# Patient Record
Sex: Female | Born: 1940 | Race: White | Hispanic: No | Marital: Married
Health system: Southern US, Academic
[De-identification: ages and names within clinical notes are randomized; demographics above are authoritative.]

## PROBLEM LIST (undated history)

## (undated) ENCOUNTER — Ambulatory Visit: Payer: MEDICARE

## (undated) ENCOUNTER — Encounter: Payer: MEDICARE | Attending: Dermatology | Primary: Dermatology

## (undated) ENCOUNTER — Encounter

## (undated) ENCOUNTER — Encounter
Attending: Student in an Organized Health Care Education/Training Program | Primary: Student in an Organized Health Care Education/Training Program

## (undated) ENCOUNTER — Ambulatory Visit

## (undated) ENCOUNTER — Telehealth

## (undated) ENCOUNTER — Encounter: Attending: MOHS-Micrographic Surgery | Primary: MOHS-Micrographic Surgery

## (undated) DIAGNOSIS — L509 Urticaria, unspecified: Secondary | ICD-10-CM

## (undated) DIAGNOSIS — C50919 Malignant neoplasm of unspecified site of unspecified female breast: Secondary | ICD-10-CM

## (undated) DIAGNOSIS — I4892 Unspecified atrial flutter: Secondary | ICD-10-CM

## (undated) DIAGNOSIS — Z8051 Family history of malignant neoplasm of kidney: Secondary | ICD-10-CM

## (undated) DIAGNOSIS — J4489 Other specified chronic obstructive pulmonary disease: Secondary | ICD-10-CM

## (undated) DIAGNOSIS — J449 Chronic obstructive pulmonary disease, unspecified: Secondary | ICD-10-CM

## (undated) DIAGNOSIS — M949 Disorder of cartilage, unspecified: Secondary | ICD-10-CM

## (undated) DIAGNOSIS — Z800 Family history of malignant neoplasm of digestive organs: Secondary | ICD-10-CM

## (undated) DIAGNOSIS — M899 Disorder of bone, unspecified: Secondary | ICD-10-CM

## (undated) DIAGNOSIS — J45909 Unspecified asthma, uncomplicated: Secondary | ICD-10-CM

## (undated) DIAGNOSIS — G459 Transient cerebral ischemic attack, unspecified: Secondary | ICD-10-CM

## (undated) DIAGNOSIS — I639 Cerebral infarction, unspecified: Secondary | ICD-10-CM

## (undated) DIAGNOSIS — I5189 Other ill-defined heart diseases: Secondary | ICD-10-CM

## (undated) DIAGNOSIS — Z8041 Family history of malignant neoplasm of ovary: Secondary | ICD-10-CM

## (undated) DIAGNOSIS — I499 Cardiac arrhythmia, unspecified: Secondary | ICD-10-CM

## (undated) HISTORY — DX: Unspecified atrial flutter: I48.92

## (undated) HISTORY — DX: Family history of malignant neoplasm of kidney: Z80.51

## (undated) HISTORY — DX: Chronic obstructive pulmonary disease, unspecified: J44.9

## (undated) HISTORY — DX: Other ill-defined heart diseases: I51.89

## (undated) HISTORY — PX: BUNIONECTOMY: SHX129

## (undated) HISTORY — DX: Disorder of bone, unspecified: M94.9

## (undated) HISTORY — DX: Unspecified asthma, uncomplicated: J45.909

## (undated) HISTORY — DX: Disorder of bone, unspecified: M89.9

## (undated) HISTORY — DX: Family history of malignant neoplasm of digestive organs: Z80.0

## (undated) HISTORY — PX: EYE SURGERY: SHX253

## (undated) HISTORY — DX: Other specified chronic obstructive pulmonary disease: J44.89

## (undated) HISTORY — DX: Transient cerebral ischemic attack, unspecified: G45.9

## (undated) HISTORY — DX: Family history of malignant neoplasm of ovary: Z80.41

## (undated) HISTORY — DX: Urticaria, unspecified: L50.9

## (undated) HISTORY — PX: ABDOMINAL HYSTERECTOMY: SHX81

---

## 1999-08-12 ENCOUNTER — Other Ambulatory Visit: Admission: RE | Admit: 1999-08-12 | Discharge: 1999-08-12 | Payer: Self-pay | Admitting: Obstetrics and Gynecology

## 2000-01-12 ENCOUNTER — Encounter: Admission: RE | Admit: 2000-01-12 | Discharge: 2000-01-12 | Payer: Self-pay | Admitting: Obstetrics and Gynecology

## 2000-01-12 ENCOUNTER — Encounter: Payer: Self-pay | Admitting: Obstetrics and Gynecology

## 2000-02-16 ENCOUNTER — Encounter: Payer: Self-pay | Admitting: Allergy and Immunology

## 2000-02-16 ENCOUNTER — Encounter: Admission: RE | Admit: 2000-02-16 | Discharge: 2000-02-16 | Payer: Self-pay | Admitting: Allergy and Immunology

## 2000-02-26 ENCOUNTER — Ambulatory Visit (HOSPITAL_COMMUNITY): Admission: RE | Admit: 2000-02-26 | Discharge: 2000-02-26 | Payer: Self-pay | Admitting: Allergy and Immunology

## 2000-09-19 ENCOUNTER — Ambulatory Visit (HOSPITAL_COMMUNITY): Admission: RE | Admit: 2000-09-19 | Discharge: 2000-09-19 | Payer: Self-pay | Admitting: Allergy and Immunology

## 2000-10-03 ENCOUNTER — Encounter: Admission: RE | Admit: 2000-10-03 | Discharge: 2000-10-03 | Payer: Self-pay | Admitting: Orthopedic Surgery

## 2000-10-03 ENCOUNTER — Encounter: Payer: Self-pay | Admitting: Orthopedic Surgery

## 2000-10-14 ENCOUNTER — Other Ambulatory Visit: Admission: RE | Admit: 2000-10-14 | Discharge: 2000-10-14 | Payer: Self-pay | Admitting: Obstetrics and Gynecology

## 2000-10-18 ENCOUNTER — Encounter (HOSPITAL_COMMUNITY): Admission: RE | Admit: 2000-10-18 | Discharge: 2000-12-21 | Payer: Self-pay | Admitting: Family Medicine

## 2000-12-22 ENCOUNTER — Encounter (HOSPITAL_COMMUNITY): Admission: RE | Admit: 2000-12-22 | Discharge: 2001-03-22 | Payer: Self-pay | Admitting: Family Medicine

## 2001-01-13 ENCOUNTER — Encounter: Payer: Self-pay | Admitting: Family Medicine

## 2001-01-13 ENCOUNTER — Encounter: Admission: RE | Admit: 2001-01-13 | Discharge: 2001-01-13 | Payer: Self-pay | Admitting: Family Medicine

## 2001-03-23 ENCOUNTER — Encounter (HOSPITAL_COMMUNITY): Admission: RE | Admit: 2001-03-23 | Discharge: 2001-06-21 | Payer: Self-pay | Admitting: Family Medicine

## 2001-06-09 ENCOUNTER — Encounter: Admission: RE | Admit: 2001-06-09 | Discharge: 2001-06-09 | Payer: Self-pay | Admitting: Family Medicine

## 2001-06-09 ENCOUNTER — Encounter: Payer: Self-pay | Admitting: Family Medicine

## 2001-06-24 ENCOUNTER — Encounter (HOSPITAL_COMMUNITY): Admission: RE | Admit: 2001-06-24 | Discharge: 2001-09-22 | Payer: Self-pay | Admitting: Family Medicine

## 2001-10-19 ENCOUNTER — Other Ambulatory Visit: Admission: RE | Admit: 2001-10-19 | Discharge: 2001-10-19 | Payer: Self-pay | Admitting: Obstetrics and Gynecology

## 2002-01-17 ENCOUNTER — Encounter: Admission: RE | Admit: 2002-01-17 | Discharge: 2002-01-17 | Payer: Self-pay | Admitting: Family Medicine

## 2002-01-17 ENCOUNTER — Encounter: Payer: Self-pay | Admitting: Family Medicine

## 2003-01-23 ENCOUNTER — Encounter: Payer: Self-pay | Admitting: Family Medicine

## 2003-01-23 ENCOUNTER — Encounter: Admission: RE | Admit: 2003-01-23 | Discharge: 2003-01-23 | Payer: Self-pay | Admitting: Family Medicine

## 2004-02-20 ENCOUNTER — Encounter: Admission: RE | Admit: 2004-02-20 | Discharge: 2004-02-20 | Payer: Self-pay | Admitting: Family Medicine

## 2004-08-27 ENCOUNTER — Ambulatory Visit: Payer: Self-pay | Admitting: Family Medicine

## 2004-09-08 ENCOUNTER — Ambulatory Visit: Payer: Self-pay | Admitting: Family Medicine

## 2004-09-16 ENCOUNTER — Ambulatory Visit: Payer: Self-pay | Admitting: Internal Medicine

## 2004-11-09 ENCOUNTER — Ambulatory Visit: Payer: Self-pay | Admitting: Internal Medicine

## 2004-11-09 LAB — PULMONARY FUNCTION TEST

## 2004-11-19 ENCOUNTER — Ambulatory Visit: Payer: Self-pay | Admitting: Family Medicine

## 2004-12-02 ENCOUNTER — Ambulatory Visit: Payer: Self-pay | Admitting: Family Medicine

## 2004-12-11 ENCOUNTER — Ambulatory Visit: Payer: Self-pay | Admitting: Internal Medicine

## 2005-01-05 ENCOUNTER — Ambulatory Visit: Payer: Self-pay | Admitting: Internal Medicine

## 2005-01-18 ENCOUNTER — Ambulatory Visit: Payer: Self-pay | Admitting: Family Medicine

## 2005-01-29 ENCOUNTER — Ambulatory Visit: Payer: Self-pay | Admitting: Family Medicine

## 2005-02-12 ENCOUNTER — Ambulatory Visit: Payer: Self-pay | Admitting: Internal Medicine

## 2005-02-16 ENCOUNTER — Ambulatory Visit: Payer: Self-pay | Admitting: Internal Medicine

## 2005-03-10 ENCOUNTER — Encounter: Admission: RE | Admit: 2005-03-10 | Discharge: 2005-03-10 | Payer: Self-pay | Admitting: Family Medicine

## 2005-06-16 ENCOUNTER — Ambulatory Visit: Payer: Self-pay | Admitting: Internal Medicine

## 2005-06-23 ENCOUNTER — Ambulatory Visit: Payer: Self-pay | Admitting: Family Medicine

## 2005-10-15 ENCOUNTER — Ambulatory Visit: Payer: Self-pay | Admitting: Internal Medicine

## 2005-10-29 ENCOUNTER — Ambulatory Visit: Payer: Self-pay | Admitting: Internal Medicine

## 2005-11-12 ENCOUNTER — Ambulatory Visit: Payer: Self-pay | Admitting: Internal Medicine

## 2005-11-26 ENCOUNTER — Ambulatory Visit: Payer: Self-pay | Admitting: Internal Medicine

## 2005-12-10 ENCOUNTER — Ambulatory Visit: Payer: Self-pay | Admitting: Internal Medicine

## 2005-12-24 ENCOUNTER — Ambulatory Visit: Payer: Self-pay | Admitting: Internal Medicine

## 2006-01-07 ENCOUNTER — Ambulatory Visit: Payer: Self-pay | Admitting: Internal Medicine

## 2006-01-21 ENCOUNTER — Ambulatory Visit: Payer: Self-pay | Admitting: Internal Medicine

## 2006-02-04 ENCOUNTER — Ambulatory Visit: Payer: Self-pay | Admitting: Internal Medicine

## 2006-02-17 ENCOUNTER — Ambulatory Visit: Payer: Self-pay | Admitting: Internal Medicine

## 2006-02-18 ENCOUNTER — Ambulatory Visit: Payer: Self-pay | Admitting: Internal Medicine

## 2006-03-04 ENCOUNTER — Ambulatory Visit: Payer: Self-pay | Admitting: Internal Medicine

## 2006-03-04 ENCOUNTER — Ambulatory Visit: Payer: Self-pay | Admitting: Family Medicine

## 2006-03-04 LAB — CONVERTED CEMR LAB
ALT: 27 units/L (ref 0–40)
AST: 34 units/L (ref 0–37)
BUN: 18 mg/dL (ref 6–23)
Basophils Relative: 0.2 % (ref 0.0–1.0)
Cholesterol: 224 mg/dL (ref 0–200)
Glucose, Bld: 111 mg/dL — ABNORMAL HIGH (ref 70–99)
HDL: 32.7 mg/dL — ABNORMAL LOW (ref >39.0)
LDL Cholesterol: 155 mg/dL — ABNORMAL HIGH (ref 0–99)
Lymphocytes Relative: 17.3 % (ref 12.0–46.0)
MCHC: 32.7 g/dL (ref 30.0–36.0)
Monocytes Absolute: 0.5 10*3/uL (ref 0.2–0.7)
Monocytes Relative: 6.6 % (ref 3.0–11.0)
Neutro Abs: 5 10*3/uL (ref 1.4–7.7)
Platelets: 261 10*3/uL (ref 150–400)
Triglyceride fasting, serum: 183 mg/dL — ABNORMAL HIGH (ref 0–149)
VLDL: 37 mg/dL (ref 0–40)

## 2006-03-11 ENCOUNTER — Encounter: Admission: RE | Admit: 2006-03-11 | Discharge: 2006-03-11 | Payer: Self-pay | Admitting: Family Medicine

## 2006-03-11 IMAGING — MG MM DIGITAL SCREENING BILAT W/ CAD
5 series · 5 of 5 positions shown · non-contrast
Comparison: none

DG SCREEN MAMMOGRAM BILATERAL
Bilateral CC and MLO view(s) were taken.

DIGITAL SCREENING MAMMOGRAM WITH CAD:
There is a fibroglandular pattern.  No masses or malignant type calcifications are identified.  
Compared with prior studies.

[R CC]
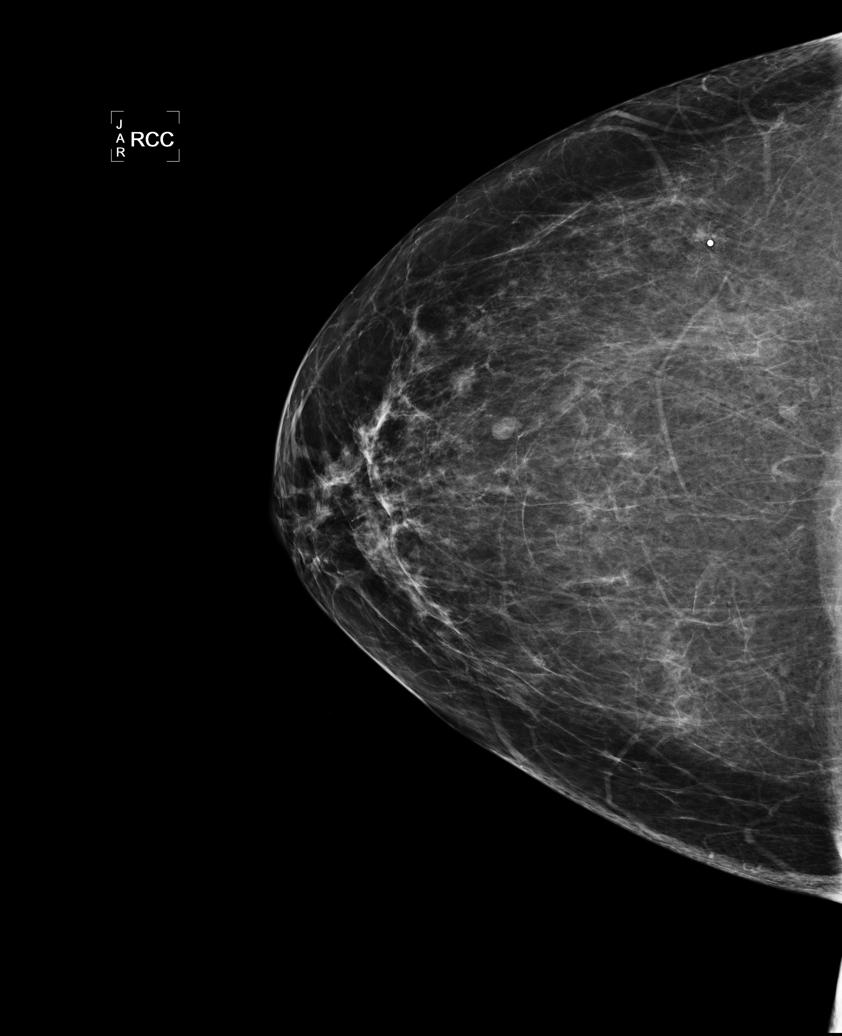

[L CC]
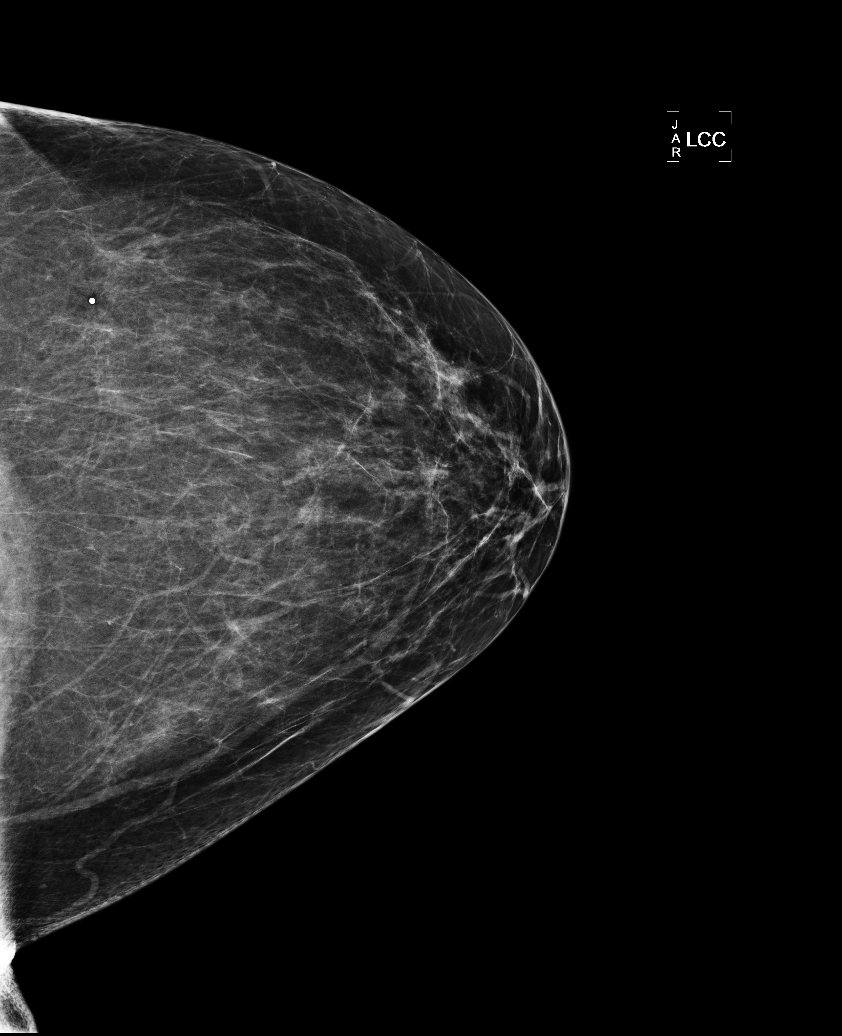

[L MLO]
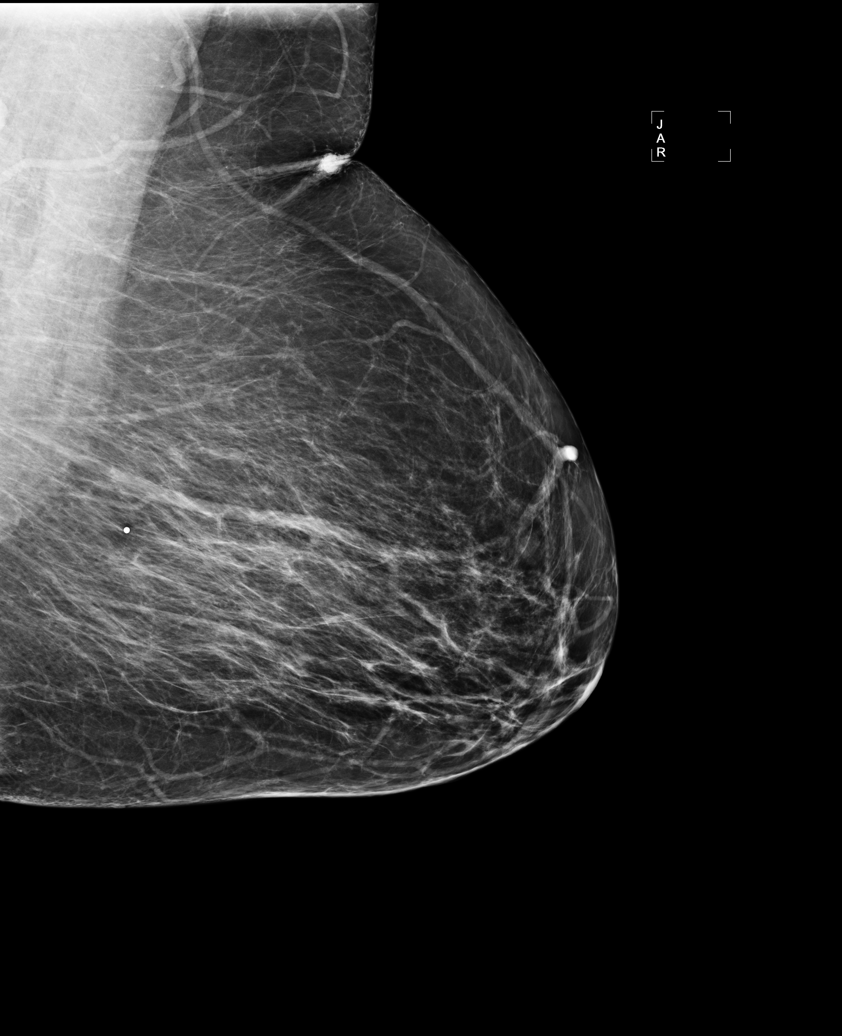

[R MLO]
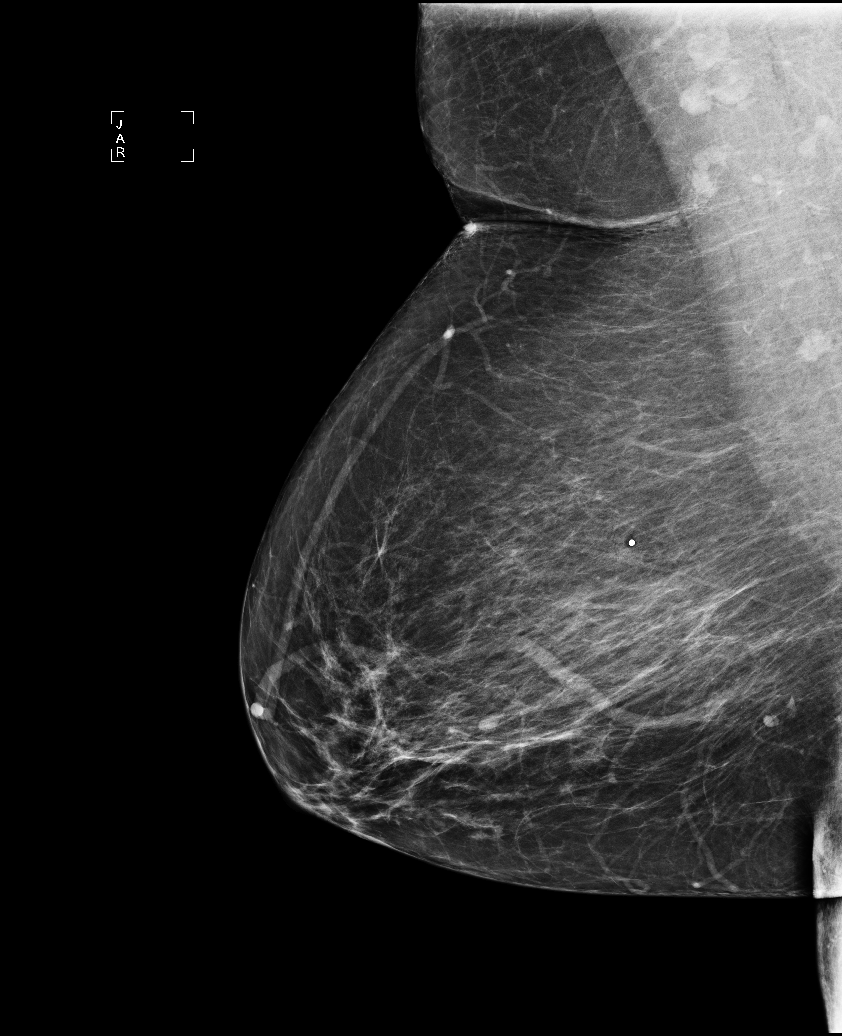

[L XCCL]
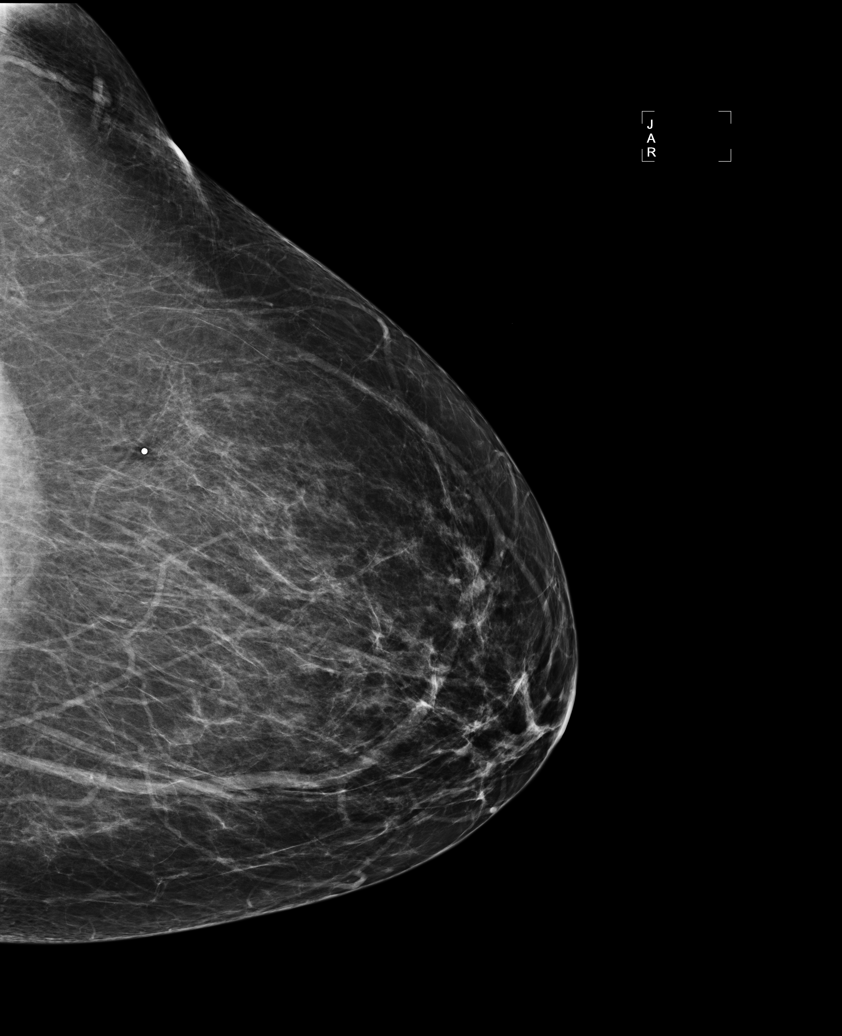

[5 of 5 positions shown; findings below may reference images not displayed]

IMPRESSION: No specific mammographic evidence of malignancy.  Next screening mammogram is recommended in one 
year.

ASSESSMENT: Negative - BI-RADS 1

Screening mammogram in 1 year.
ANALYZED BY COMPUTER AIDED DETECTION. , THIS PROCEDURE WAS A DIGITAL MAMMOGRAM.

## 2006-03-17 ENCOUNTER — Ambulatory Visit: Payer: Self-pay | Admitting: Internal Medicine

## 2006-04-01 ENCOUNTER — Ambulatory Visit: Payer: Self-pay | Admitting: Internal Medicine

## 2006-04-15 ENCOUNTER — Ambulatory Visit: Payer: Self-pay | Admitting: Internal Medicine

## 2006-04-29 ENCOUNTER — Ambulatory Visit: Payer: Self-pay | Admitting: Internal Medicine

## 2006-05-13 ENCOUNTER — Ambulatory Visit: Payer: Self-pay | Admitting: Internal Medicine

## 2006-05-27 ENCOUNTER — Ambulatory Visit: Payer: Self-pay | Admitting: Internal Medicine

## 2006-06-10 ENCOUNTER — Ambulatory Visit: Payer: Self-pay | Admitting: Internal Medicine

## 2006-06-20 ENCOUNTER — Ambulatory Visit: Payer: Self-pay | Admitting: Internal Medicine

## 2006-06-24 ENCOUNTER — Ambulatory Visit: Payer: Self-pay | Admitting: Internal Medicine

## 2006-07-08 ENCOUNTER — Ambulatory Visit: Payer: Self-pay | Admitting: Internal Medicine

## 2006-07-25 ENCOUNTER — Ambulatory Visit: Payer: Self-pay | Admitting: Internal Medicine

## 2006-07-27 ENCOUNTER — Ambulatory Visit: Payer: Self-pay | Admitting: Internal Medicine

## 2006-07-27 LAB — CONVERTED CEMR LAB
BUN: 19 mg/dL (ref 6–23)
CO2: 29 meq/L (ref 19–32)
Calcium: 9.1 mg/dL (ref 8.4–10.5)
Chloride: 106 meq/L (ref 96–112)
Creatinine, Ser: 0.4 mg/dL (ref 0.4–1.2)
GFR calc Af Amer: 206 mL/min
Glucose, Bld: 109 mg/dL — ABNORMAL HIGH (ref 70–99)

## 2006-07-28 ENCOUNTER — Ambulatory Visit: Payer: Self-pay | Admitting: Cardiology

## 2006-07-28 IMAGING — CT CT CHEST W/ CM
2 of 4 series · 15 of 36 positions shown, 18 images · IV contrast (omnipaque)
Comparison: none

CLINICAL DATA: Left hilar adenopathy, hilar mass.
CHEST CT WITH CONTRAST:
TECHNIQUE: Multidetector CT imaging of the chest was performed following the standard protocol during bolus administration of intravenous contrast.
Contrast:  80 cc Omnipaque 300.

[Series 2: chest_routine 5.0 b40f st · axial · 0.75mm/px · z∈[-342,-66]mm · 12 of 66 slices shown, 15 images]
[im 6/66  mediastinal]
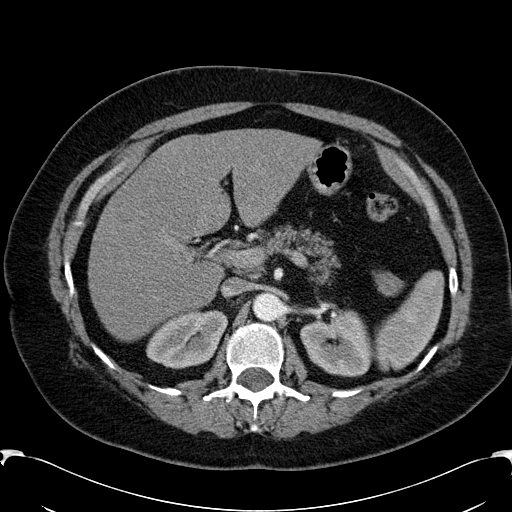
[im 6/66  lung]
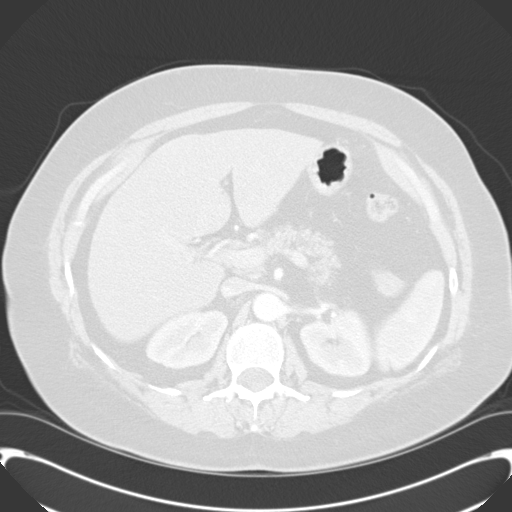
[im 11/66  lung]
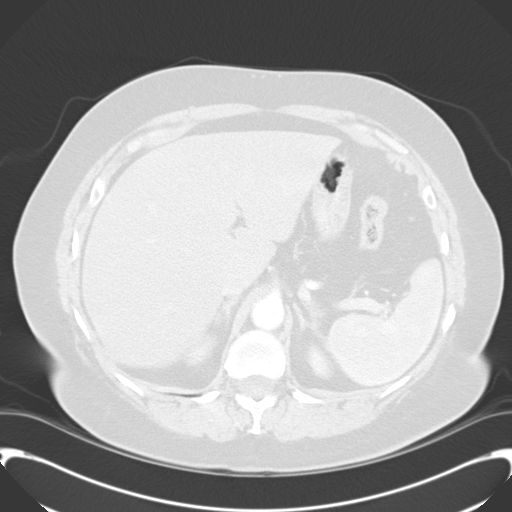
[im 16/66  lung]
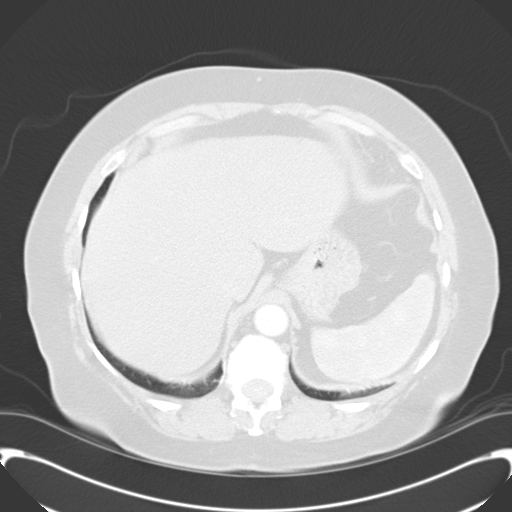
[im 21/66  lung]
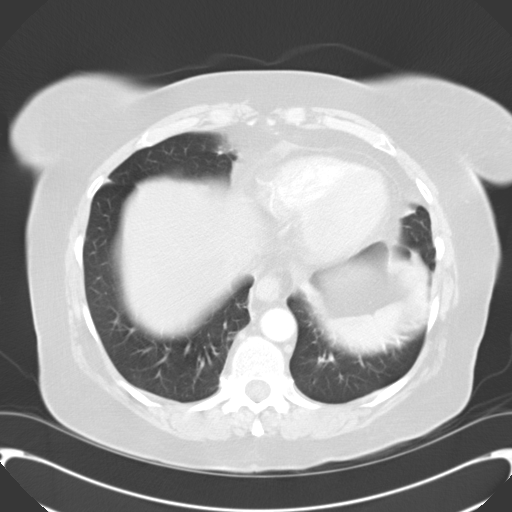
[im 26/66  mediastinal]
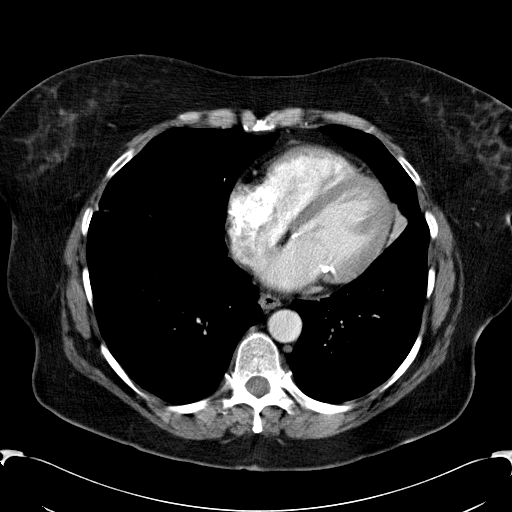
[im 26/66  lung]
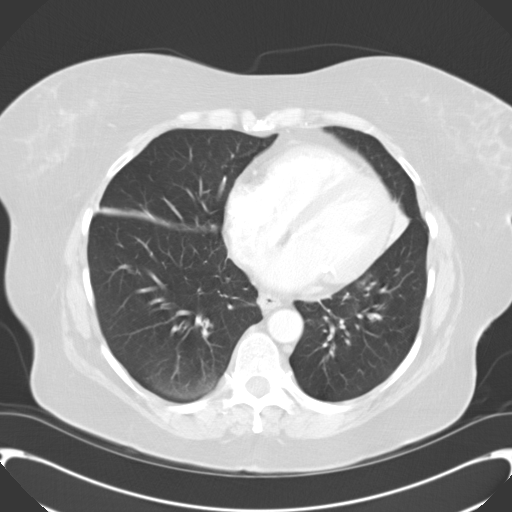
[im 31/66  lung]
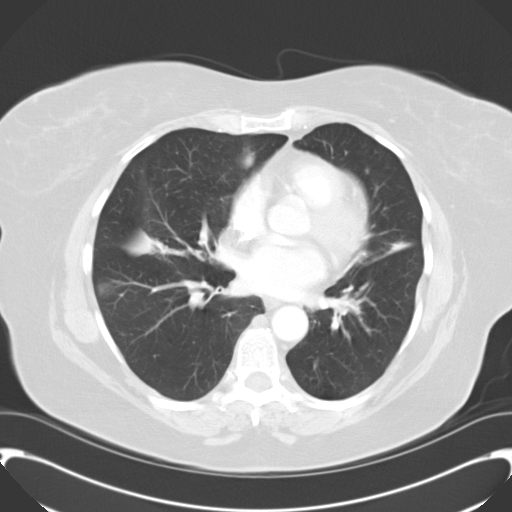
[im 36/66  lung]
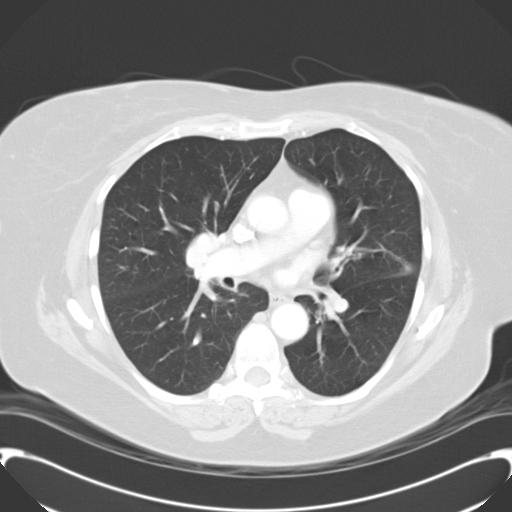
[im 41/66  lung]
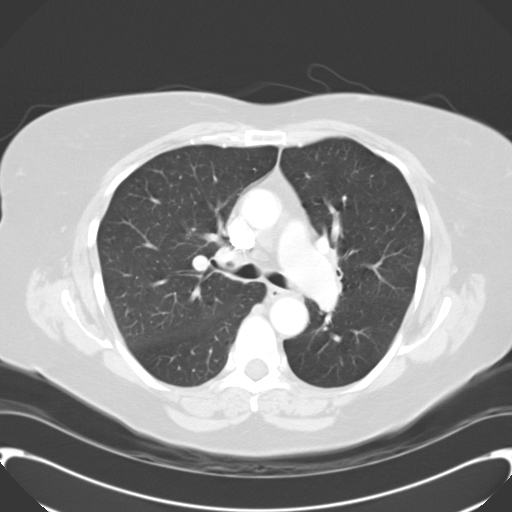
[im 46/66  mediastinal]
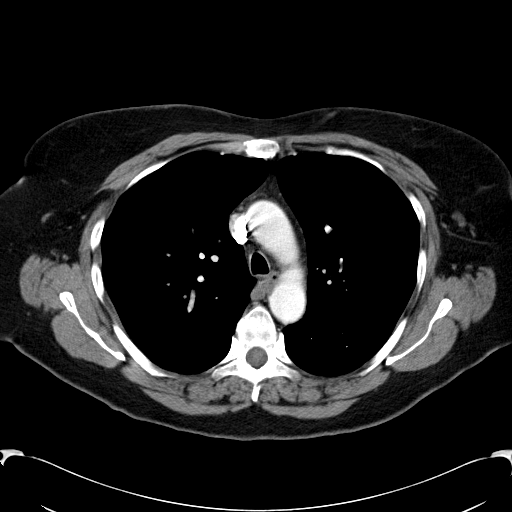
[im 46/66  lung]
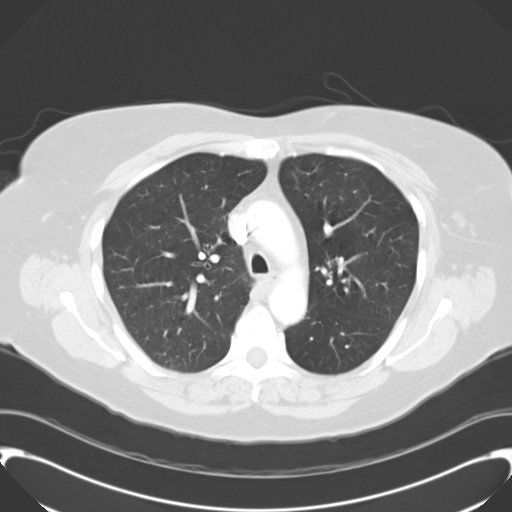
[im 51/66  lung]
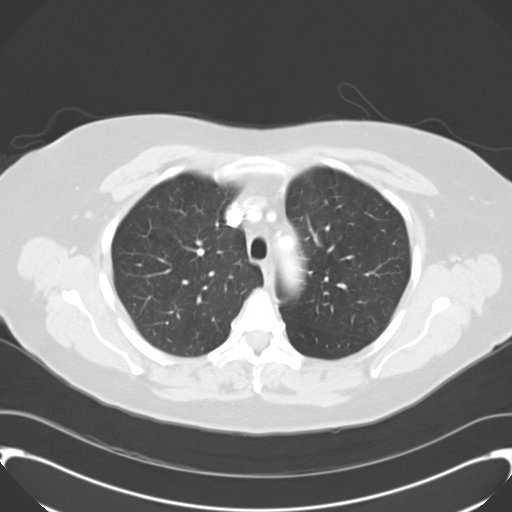
[im 56/66  lung]
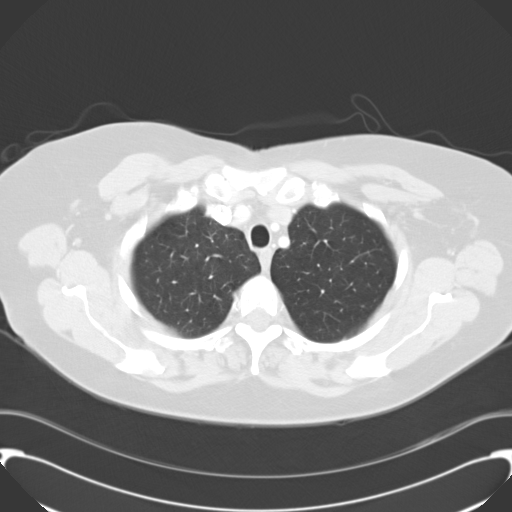
[im 61/66  lung]
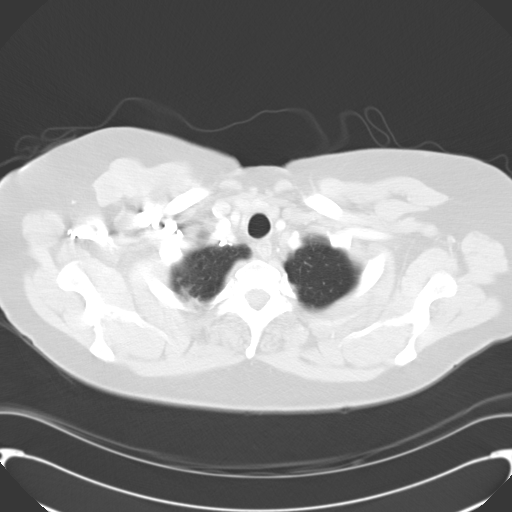

[Series 602: <mpr range> · coronal · 0.75mm/px · 3 of 90 slices shown]
[im 18/90  lung]
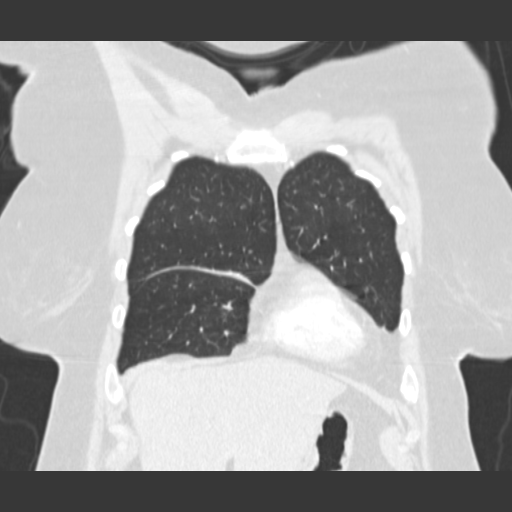
[im 36/90  lung]
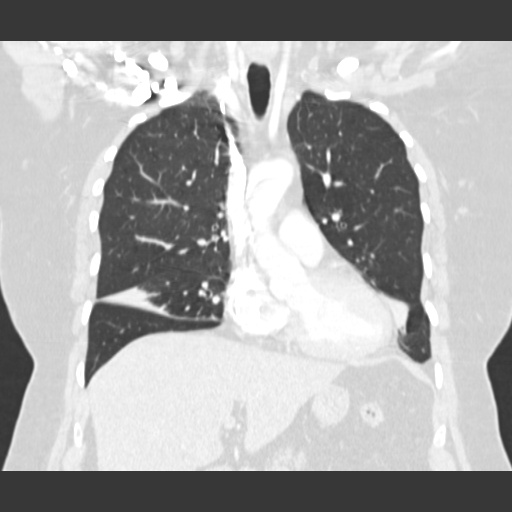
[im 54/90  lung]
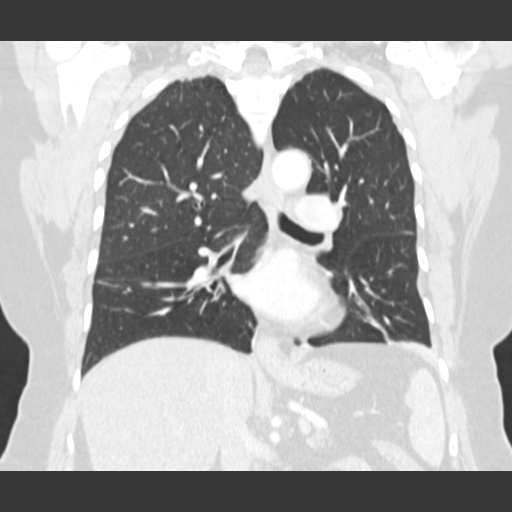

[15 of 36 positions shown; findings below may reference images not displayed]

FINDINGS: There are multiple observations, including prominent caliber of the main and central pulmonary arteries, particularly the left main and proximal descending left pulmonary artery. The main pulmonary artery segment appears aneurysmally dilated measuring 3.3 cm in diameter. The etiology for this is uncertain. One consideration would be pulmonic stenosis.  Another consideration would be pulmonary artery aneurysm. Another observation is the fact that the patient has findings compatible with a pulmonary varix on the right.  The pulmonary veins draining the right upper lobe appear enlarged, particularly where they form a confluence just before entering the left atrium.  This would be compatible with a pulmonary varix.  
Centrilobular emphysema.   subpleural nodule left lower lobe (see image 38, series 3).  It has a low-density reading in the minus range. Therefore, it may represent a small benign hamartoma. Atelectatic changes/scarring noted adjacent to the pleural fissures in the posterior aspect of the right middle lobe and minimally in anterior aspect of the right upper lobe and involving the lingular segment adjacent to the lower aspect of the major pleural fissure. This may represent scarring and pleural thickening as opposed to atelectasis. Central bronchial wall thickening compatible with chronic changes.  No pathologically enlarged mediastinal or hilar lymph nodes. Dense calcification of the mitral annulus. Probable mild diffuse fatty infiltration of the liver. Suspicion for gallstones.
IMPRESSION: CT findings compatible with pulmonary varix on the right side. Aneurysmal dilatation of left main pulmonary artery ? query pulmonic stenosis versus pulmonary artery aneurysm.  No evidence of hilar adenopathy.  Centrilobular emphysema. Small benign appearing nodule left lower lobe--consider CT follow-up in 9-12 months. Suspicion for small gallstones. Fatty infiltration of the liver. See comments above.

## 2006-08-05 ENCOUNTER — Ambulatory Visit: Payer: Self-pay | Admitting: Internal Medicine

## 2006-08-18 ENCOUNTER — Ambulatory Visit: Payer: Self-pay | Admitting: Internal Medicine

## 2006-09-02 ENCOUNTER — Ambulatory Visit: Payer: Self-pay | Admitting: Internal Medicine

## 2006-09-16 ENCOUNTER — Ambulatory Visit: Payer: Self-pay | Admitting: Internal Medicine

## 2006-09-30 ENCOUNTER — Ambulatory Visit: Payer: Self-pay | Admitting: Internal Medicine

## 2006-10-14 ENCOUNTER — Ambulatory Visit: Payer: Self-pay | Admitting: Internal Medicine

## 2006-10-27 ENCOUNTER — Ambulatory Visit: Payer: Self-pay | Admitting: Internal Medicine

## 2006-11-10 ENCOUNTER — Ambulatory Visit: Payer: Self-pay | Admitting: Internal Medicine

## 2006-11-24 ENCOUNTER — Ambulatory Visit: Payer: Self-pay | Admitting: Internal Medicine

## 2006-12-09 ENCOUNTER — Ambulatory Visit: Payer: Self-pay | Admitting: Internal Medicine

## 2006-12-19 ENCOUNTER — Ambulatory Visit: Payer: Self-pay | Admitting: Internal Medicine

## 2006-12-23 ENCOUNTER — Ambulatory Visit: Payer: Self-pay | Admitting: Internal Medicine

## 2007-01-06 ENCOUNTER — Ambulatory Visit: Payer: Self-pay | Admitting: Internal Medicine

## 2007-01-20 ENCOUNTER — Ambulatory Visit: Payer: Self-pay | Admitting: Internal Medicine

## 2007-02-03 ENCOUNTER — Ambulatory Visit: Payer: Self-pay | Admitting: Internal Medicine

## 2007-02-17 ENCOUNTER — Ambulatory Visit: Payer: Self-pay | Admitting: Internal Medicine

## 2007-03-03 ENCOUNTER — Ambulatory Visit: Payer: Self-pay | Admitting: Internal Medicine

## 2007-03-09 ENCOUNTER — Encounter: Payer: Self-pay | Admitting: Family Medicine

## 2007-03-09 ENCOUNTER — Ambulatory Visit: Payer: Self-pay | Admitting: Internal Medicine

## 2007-03-17 ENCOUNTER — Ambulatory Visit: Payer: Self-pay | Admitting: Internal Medicine

## 2007-03-17 ENCOUNTER — Telehealth: Payer: Self-pay | Admitting: Family Medicine

## 2007-03-17 ENCOUNTER — Encounter: Admission: RE | Admit: 2007-03-17 | Discharge: 2007-03-17 | Payer: Self-pay | Admitting: Obstetrics and Gynecology

## 2007-03-17 IMAGING — MG MM SCREEN MAMMOGRAM BILATERAL
4 series · 4 of 4 positions shown · non-contrast
Comparison: none

DG SCREEN MAMMOGRAM BILATERAL
Bilateral CC and MLO view(s) were taken.

DIGITAL SCREENING MAMMOGRAM WITH CAD:
There are scattered fibroglandular densities.  No masses or malignant type calcifications are 
identified.  Compared with prior studies.

[R CC]
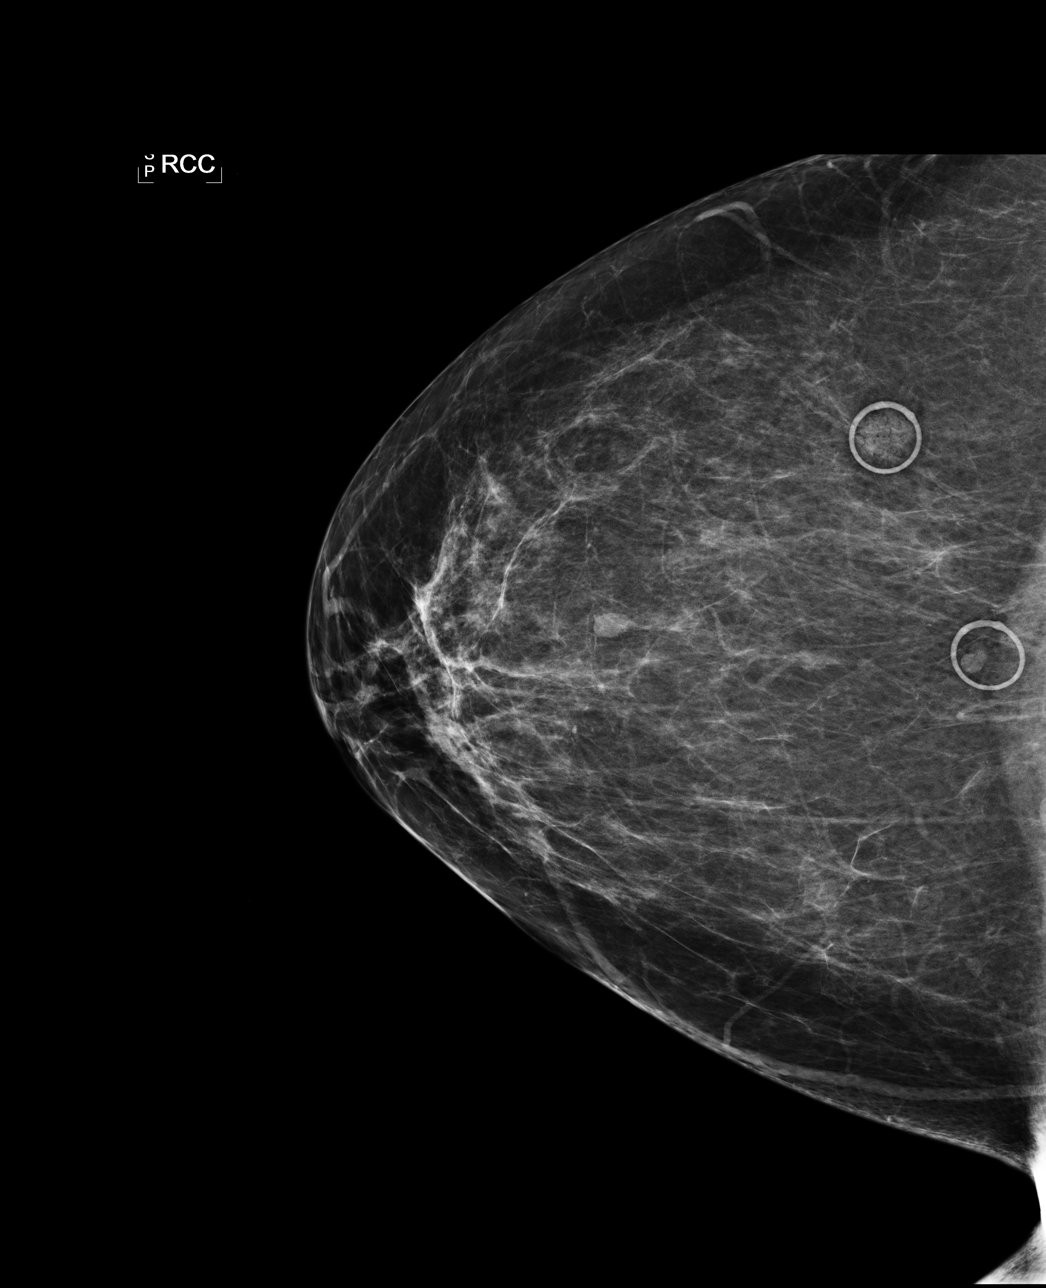

[L CC]
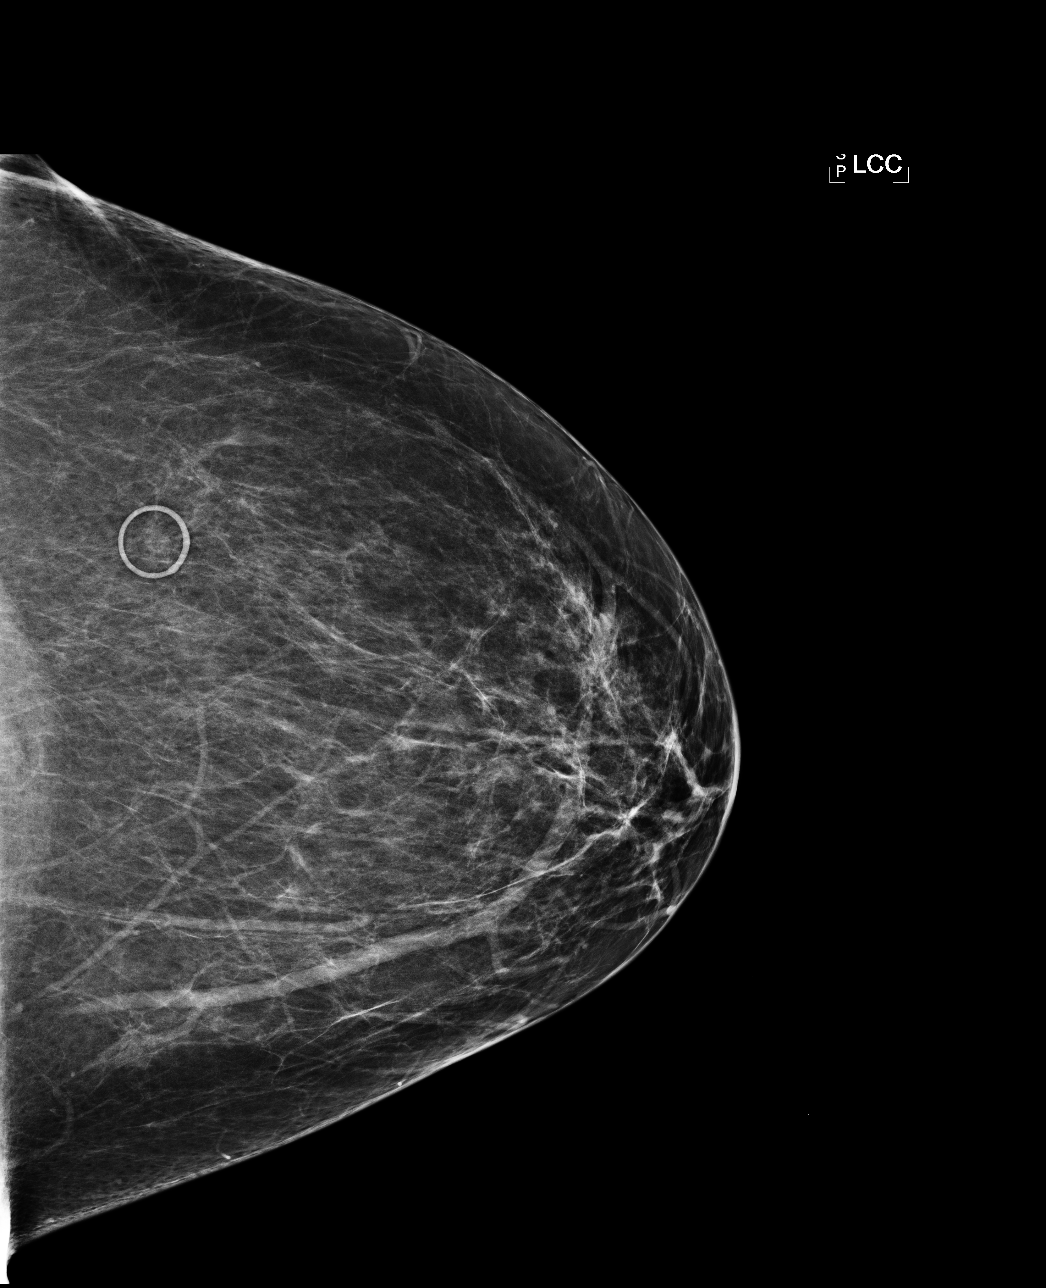

[L MLO]
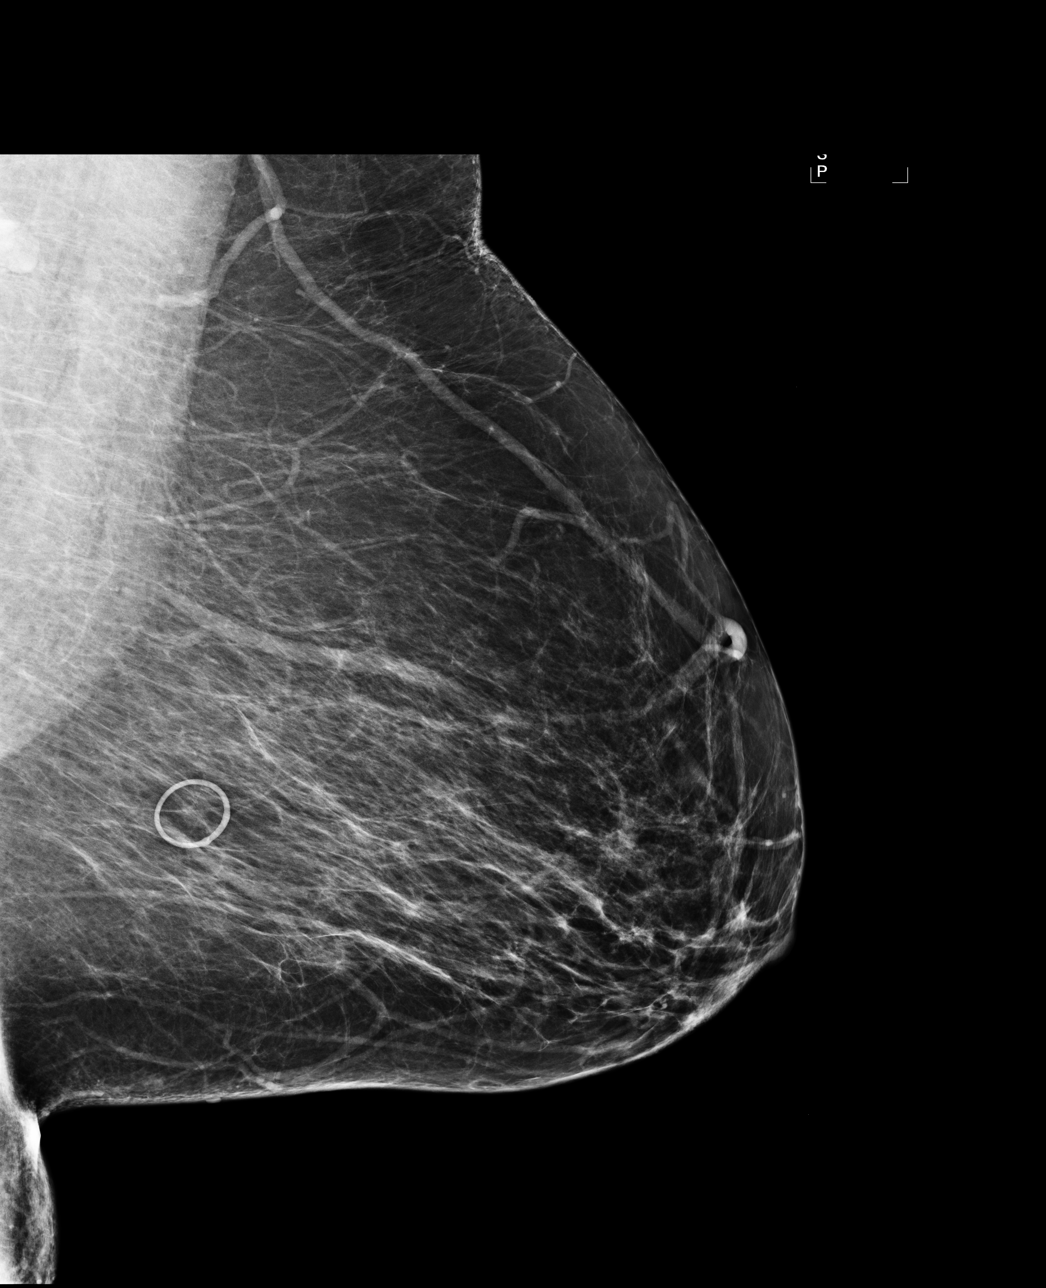

[R MLO]
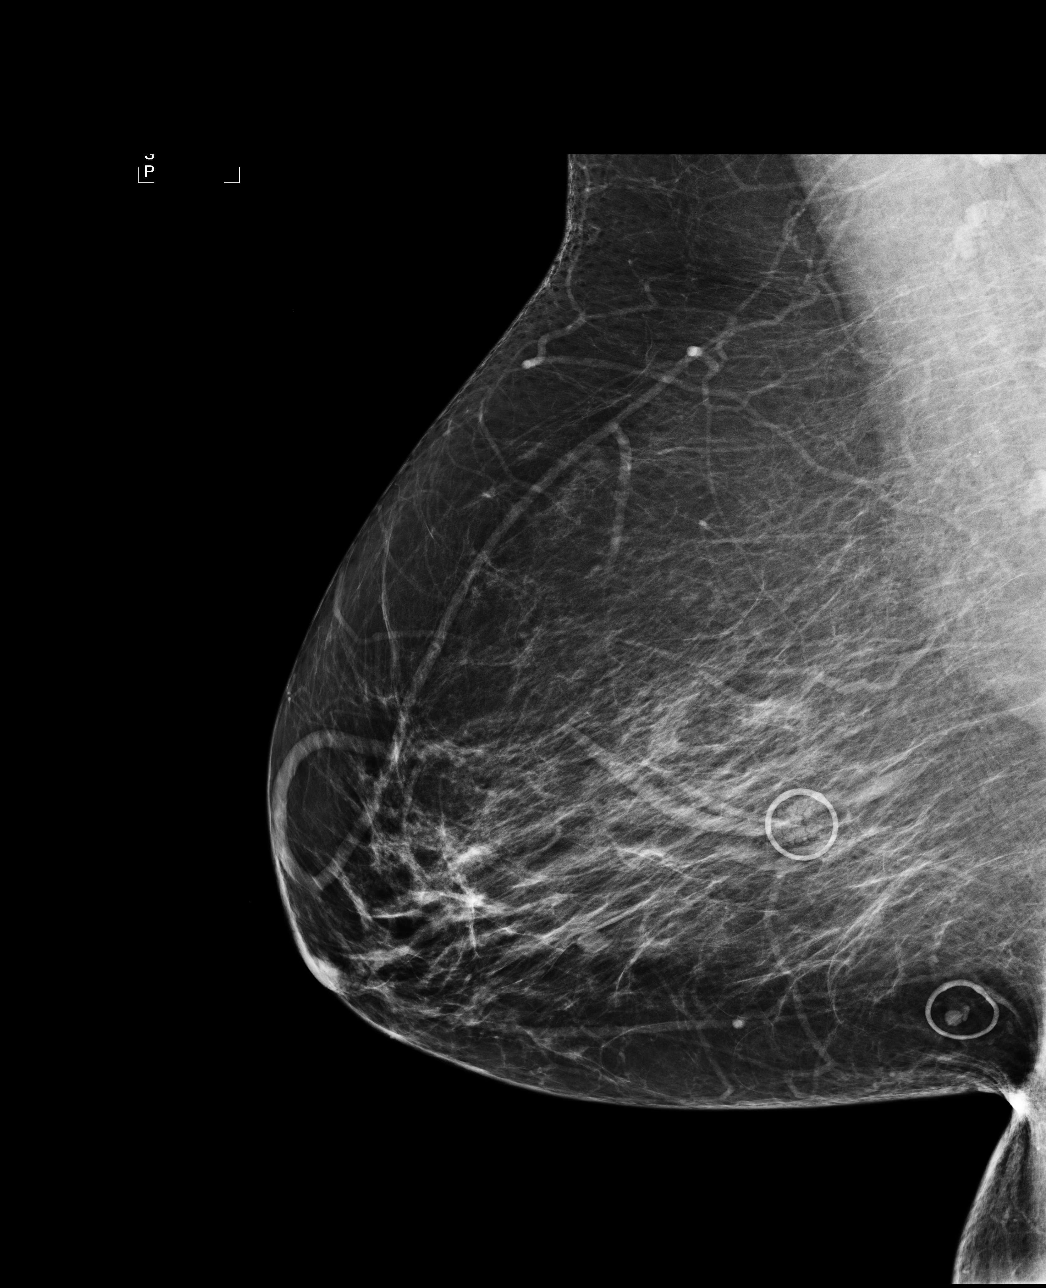

[4 of 4 positions shown; findings below may reference images not displayed]

IMPRESSION: No specific mammographic evidence of malignancy.  Next screening mammogram is recommended in one 
year.

ASSESSMENT: Negative - BI-RADS 1

Screening mammogram in 1 year.
ANALYZED BY COMPUTER AIDED DETECTION. , THIS PROCEDURE WAS A DIGITAL MAMMOGRAM.

## 2007-03-23 ENCOUNTER — Ambulatory Visit: Payer: Self-pay | Admitting: Pulmonary Disease

## 2007-03-31 ENCOUNTER — Ambulatory Visit: Payer: Self-pay | Admitting: Family Medicine

## 2007-03-31 ENCOUNTER — Ambulatory Visit: Payer: Self-pay | Admitting: Internal Medicine

## 2007-03-31 DIAGNOSIS — J449 Chronic obstructive pulmonary disease, unspecified: Secondary | ICD-10-CM

## 2007-03-31 DIAGNOSIS — M8580 Other specified disorders of bone density and structure, unspecified site: Secondary | ICD-10-CM

## 2007-03-31 DIAGNOSIS — J4489 Other specified chronic obstructive pulmonary disease: Secondary | ICD-10-CM | POA: Insufficient documentation

## 2007-04-03 ENCOUNTER — Encounter: Payer: Self-pay | Admitting: Family Medicine

## 2007-04-14 ENCOUNTER — Ambulatory Visit: Payer: Self-pay | Admitting: Internal Medicine

## 2007-04-28 ENCOUNTER — Ambulatory Visit: Payer: Self-pay | Admitting: Internal Medicine

## 2007-05-12 ENCOUNTER — Ambulatory Visit: Payer: Self-pay | Admitting: Internal Medicine

## 2007-05-26 ENCOUNTER — Ambulatory Visit: Payer: Self-pay | Admitting: Internal Medicine

## 2007-06-09 ENCOUNTER — Ambulatory Visit: Payer: Self-pay | Admitting: Internal Medicine

## 2007-06-09 ENCOUNTER — Telehealth (INDEPENDENT_AMBULATORY_CARE_PROVIDER_SITE_OTHER): Payer: Self-pay | Admitting: *Deleted

## 2007-06-20 ENCOUNTER — Ambulatory Visit: Payer: Self-pay | Admitting: Internal Medicine

## 2007-06-20 DIAGNOSIS — J069 Acute upper respiratory infection, unspecified: Secondary | ICD-10-CM | POA: Insufficient documentation

## 2007-06-21 ENCOUNTER — Telehealth: Payer: Self-pay | Admitting: Internal Medicine

## 2007-06-23 ENCOUNTER — Ambulatory Visit: Payer: Self-pay | Admitting: Internal Medicine

## 2007-07-10 ENCOUNTER — Ambulatory Visit: Payer: Self-pay | Admitting: Pulmonary Disease

## 2007-07-21 ENCOUNTER — Ambulatory Visit: Payer: Self-pay | Admitting: Internal Medicine

## 2007-08-04 ENCOUNTER — Ambulatory Visit: Payer: Self-pay | Admitting: Internal Medicine

## 2007-08-04 DIAGNOSIS — J449 Chronic obstructive pulmonary disease, unspecified: Secondary | ICD-10-CM

## 2007-08-04 DIAGNOSIS — J4489 Other specified chronic obstructive pulmonary disease: Secondary | ICD-10-CM | POA: Insufficient documentation

## 2007-08-04 DIAGNOSIS — M7520 Bicipital tendinitis, unspecified shoulder: Secondary | ICD-10-CM | POA: Insufficient documentation

## 2007-08-11 ENCOUNTER — Telehealth (INDEPENDENT_AMBULATORY_CARE_PROVIDER_SITE_OTHER): Payer: Self-pay | Admitting: *Deleted

## 2007-08-18 ENCOUNTER — Ambulatory Visit: Payer: Self-pay | Admitting: Internal Medicine

## 2007-08-18 ENCOUNTER — Ambulatory Visit: Payer: Self-pay | Admitting: Family Medicine

## 2007-08-27 ENCOUNTER — Emergency Department (HOSPITAL_COMMUNITY): Admission: EM | Admit: 2007-08-27 | Discharge: 2007-08-27 | Payer: Self-pay | Admitting: Family Medicine

## 2007-08-27 IMAGING — CR DG CHEST 2V
2 series · 2 of 2 positions shown · non-contrast
Comparison: CT chest [DATE]

CLINICAL DATA: Chills and fever

CHEST - 2 VIEW

[view not recorded (1 of 2)]
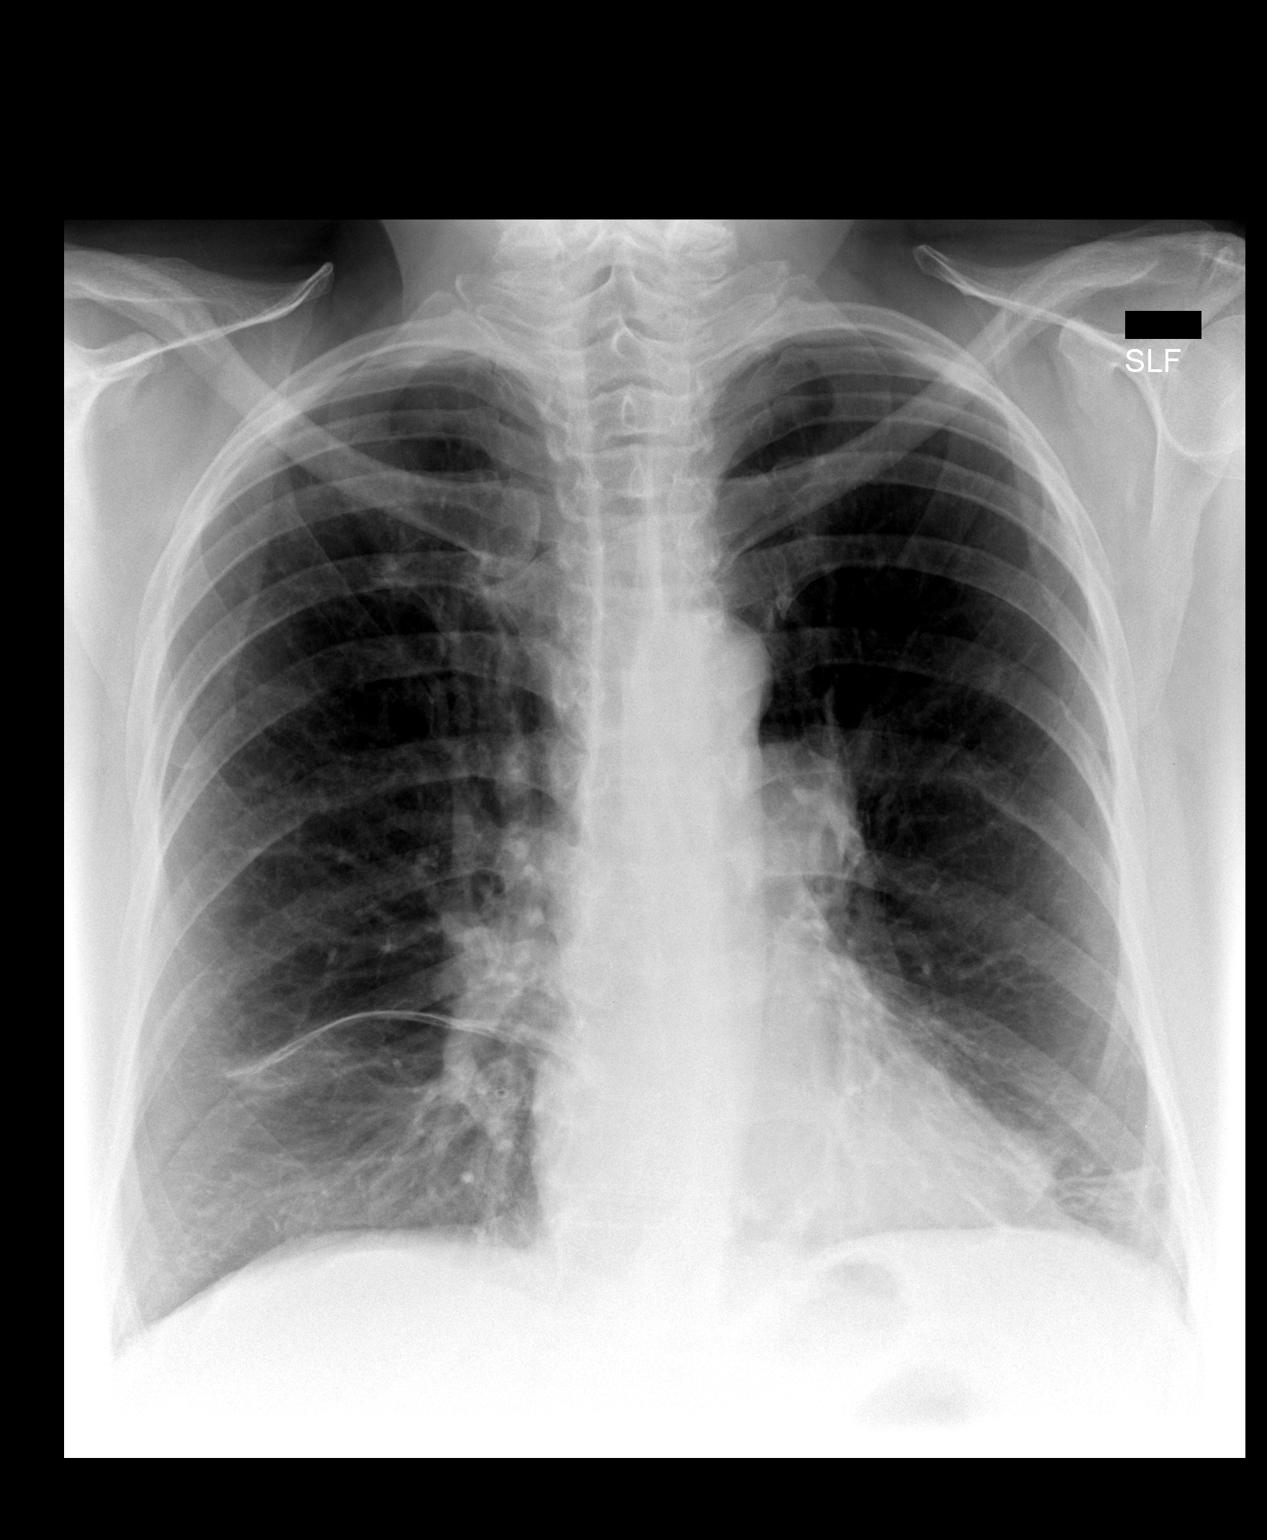

[view not recorded (2 of 2)]
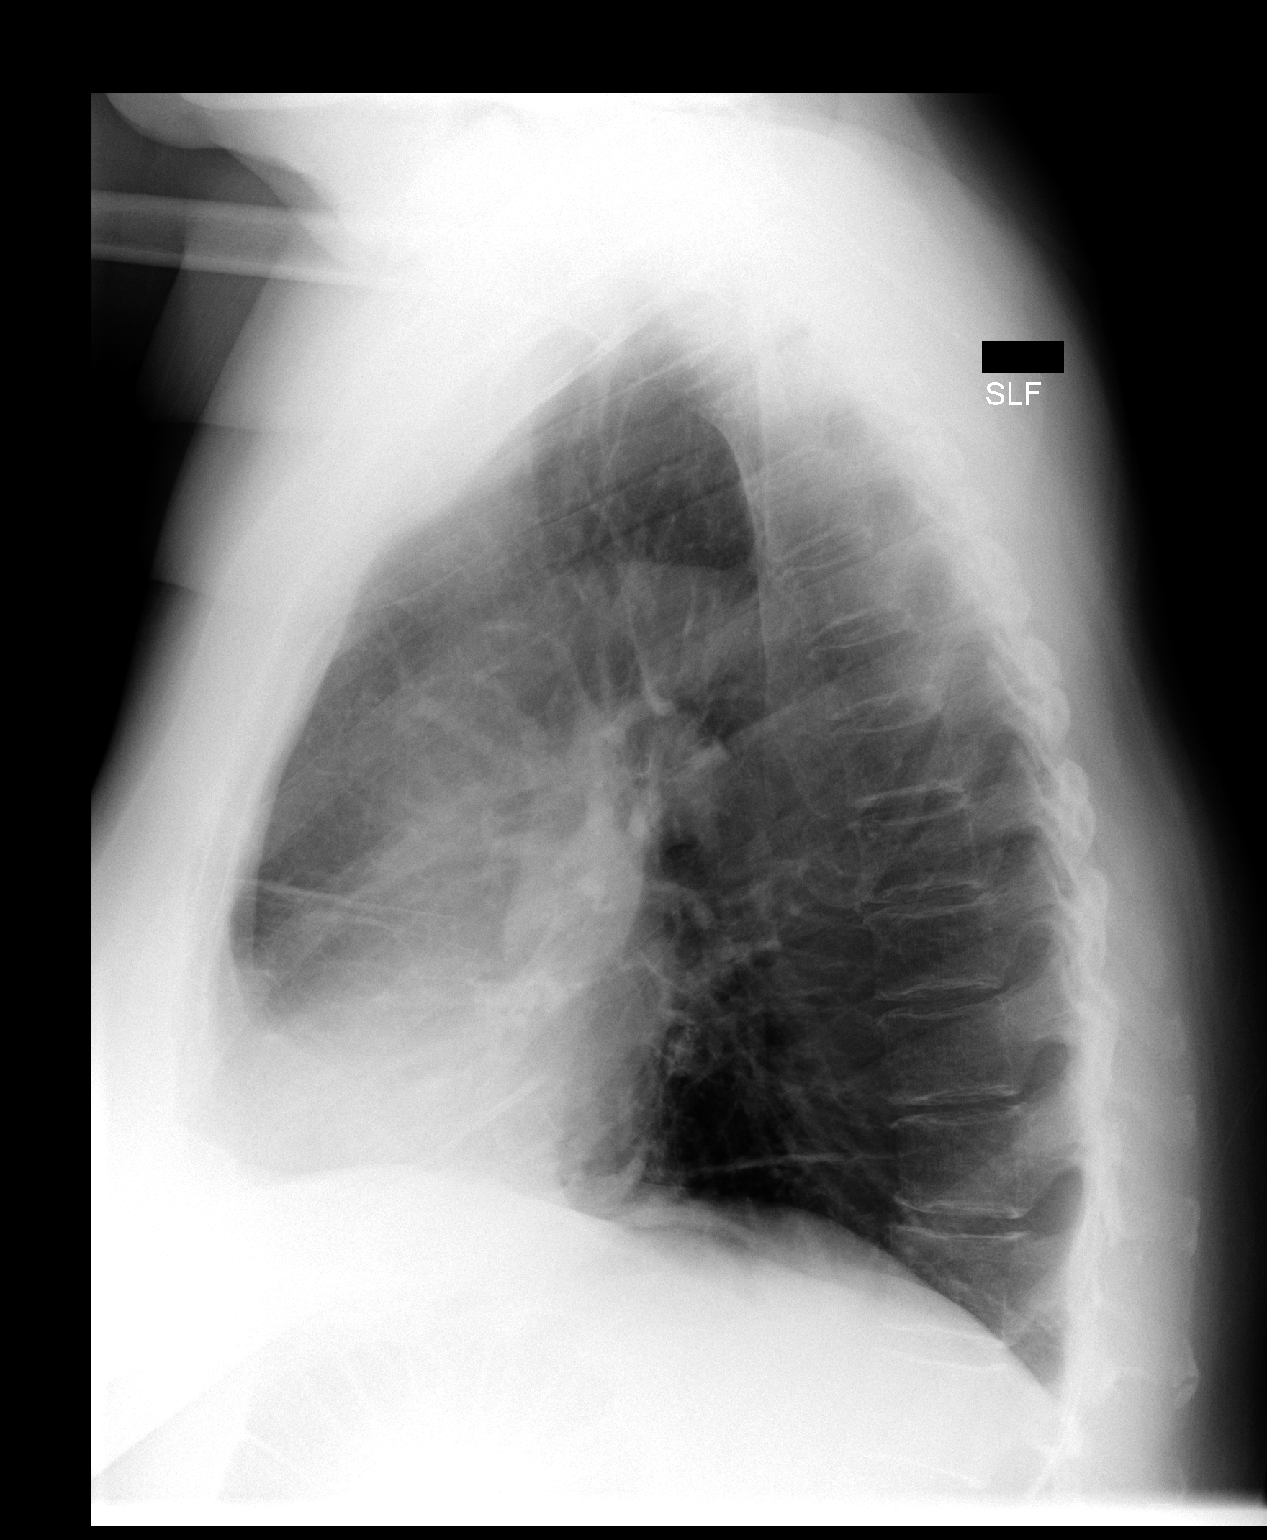

[2 of 2 positions shown; findings below may reference images not displayed]

FINDINGS: Right middle and left basilar scarring.  Negative for
edema, infiltrates or effusions. The heart is normal
IMPRESSION: Basilar scarring.  Negative for infiltrates or effusions

## 2007-09-06 ENCOUNTER — Ambulatory Visit: Payer: Self-pay | Admitting: Internal Medicine

## 2007-09-19 ENCOUNTER — Ambulatory Visit: Payer: Self-pay | Admitting: Internal Medicine

## 2007-09-29 ENCOUNTER — Ambulatory Visit: Payer: Self-pay | Admitting: Internal Medicine

## 2007-10-13 ENCOUNTER — Ambulatory Visit: Payer: Self-pay | Admitting: Internal Medicine

## 2007-10-27 ENCOUNTER — Ambulatory Visit: Payer: Self-pay | Admitting: Internal Medicine

## 2007-11-10 ENCOUNTER — Ambulatory Visit: Payer: Self-pay | Admitting: Internal Medicine

## 2007-11-23 ENCOUNTER — Ambulatory Visit: Payer: Self-pay | Admitting: Internal Medicine

## 2007-11-27 ENCOUNTER — Ambulatory Visit: Payer: Self-pay | Admitting: Internal Medicine

## 2007-12-08 ENCOUNTER — Ambulatory Visit: Payer: Self-pay | Admitting: Internal Medicine

## 2007-12-22 ENCOUNTER — Ambulatory Visit: Payer: Self-pay | Admitting: Internal Medicine

## 2008-01-05 ENCOUNTER — Ambulatory Visit: Payer: Self-pay | Admitting: Internal Medicine

## 2008-01-19 ENCOUNTER — Ambulatory Visit: Payer: Self-pay | Admitting: Internal Medicine

## 2008-02-02 ENCOUNTER — Ambulatory Visit: Payer: Self-pay | Admitting: Internal Medicine

## 2008-02-16 ENCOUNTER — Ambulatory Visit: Payer: Self-pay | Admitting: Internal Medicine

## 2008-02-16 ENCOUNTER — Ambulatory Visit: Payer: Self-pay | Admitting: Pulmonary Disease

## 2008-03-01 ENCOUNTER — Ambulatory Visit: Payer: Self-pay | Admitting: Internal Medicine

## 2008-03-15 ENCOUNTER — Ambulatory Visit: Payer: Self-pay | Admitting: Internal Medicine

## 2008-03-20 ENCOUNTER — Encounter: Admission: RE | Admit: 2008-03-20 | Discharge: 2008-03-20 | Payer: Self-pay | Admitting: Obstetrics and Gynecology

## 2008-03-20 IMAGING — MG MM DIGITAL SCREENING BILAT W/ CAD
4 series · 4 of 4 positions shown · non-contrast
Comparison: none

DG SCREEN MAMMOGRAM BILATERAL
Bilateral CC and MLO view(s) were taken.

DIGITAL SCREENING MAMMOGRAM WITH CAD:
There are scattered fibroglandular densities.  No masses or malignant type calcifications are 
identified.  Compared with prior studies.

[R CC]
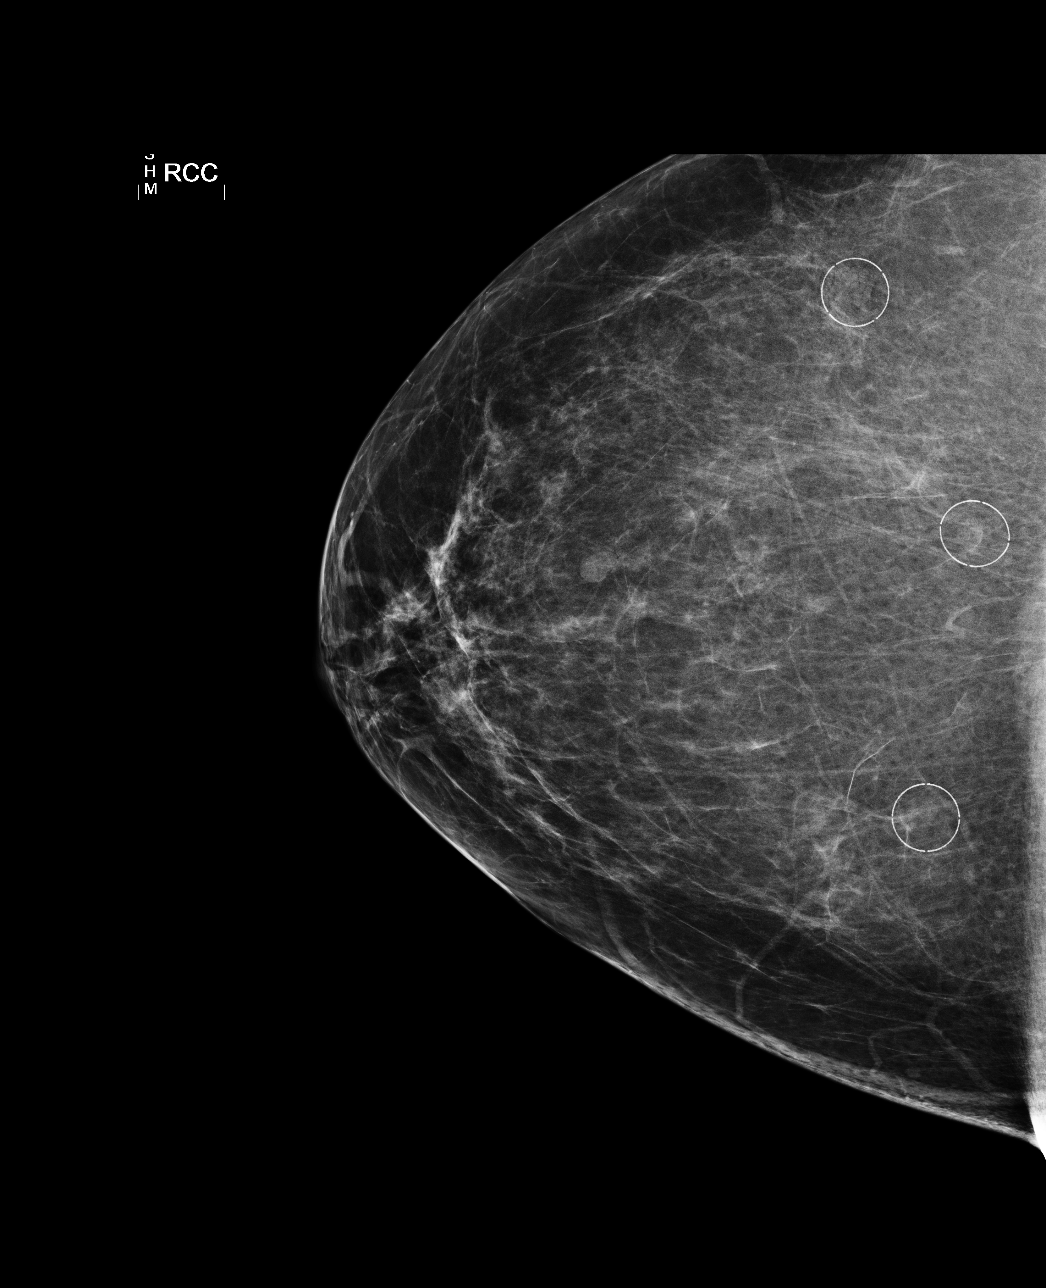

[L CC]
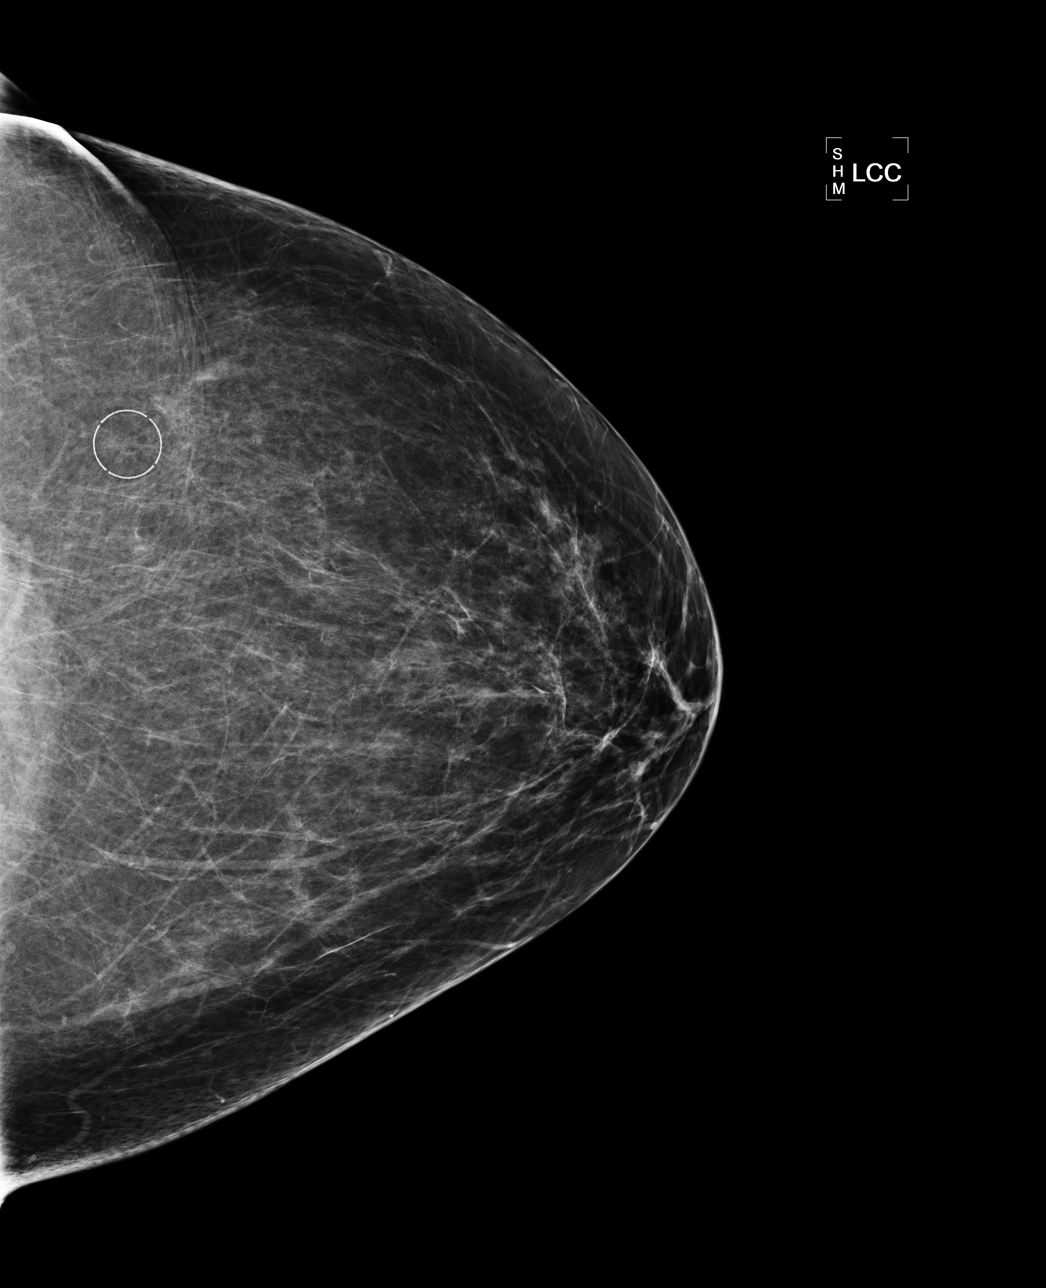

[L MLO]
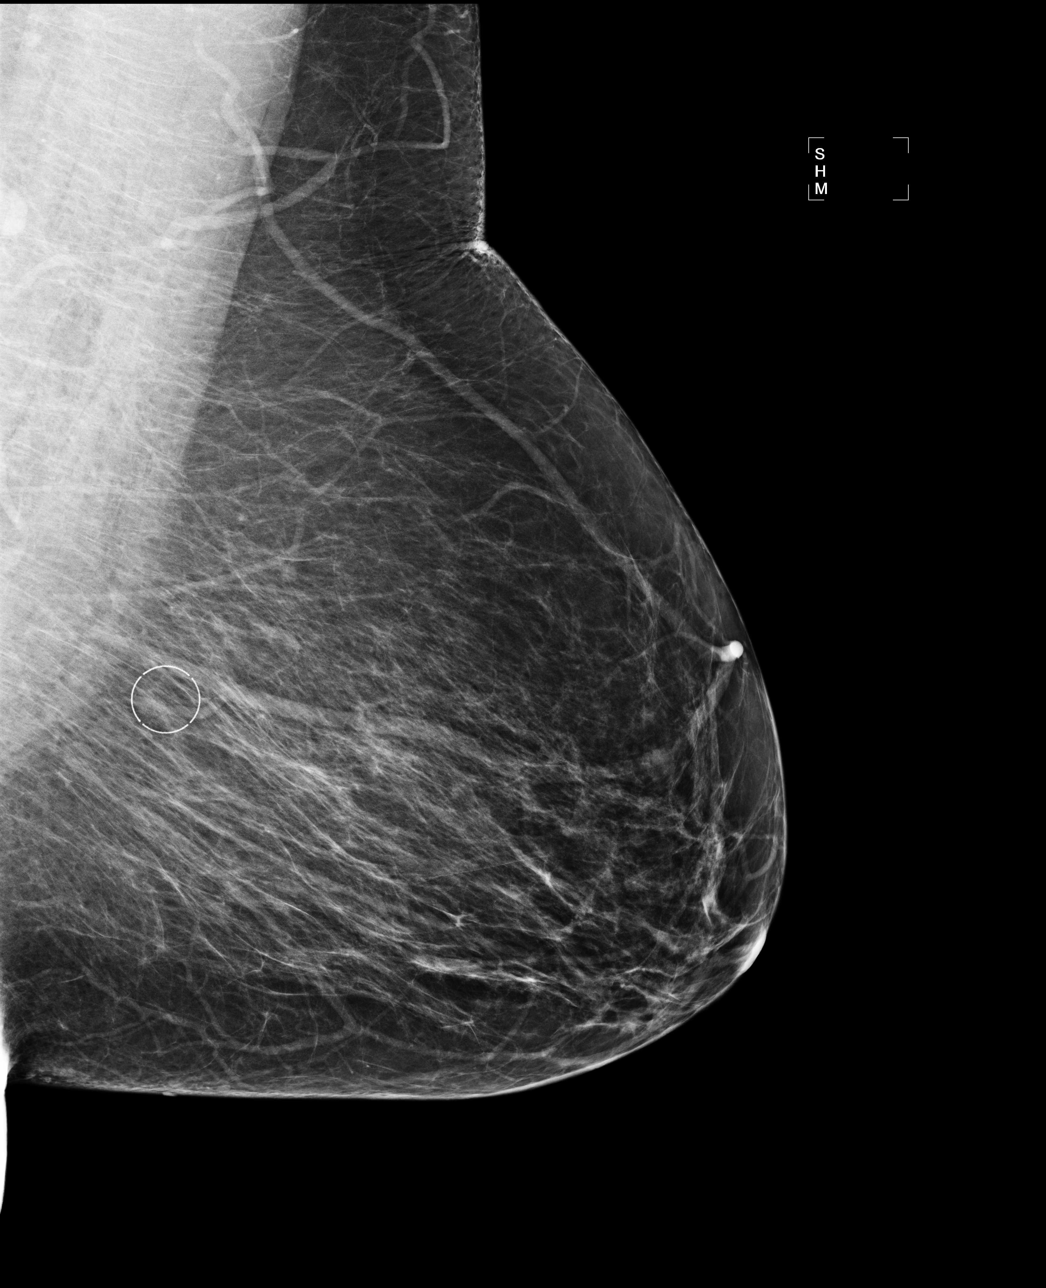

[R MLO]
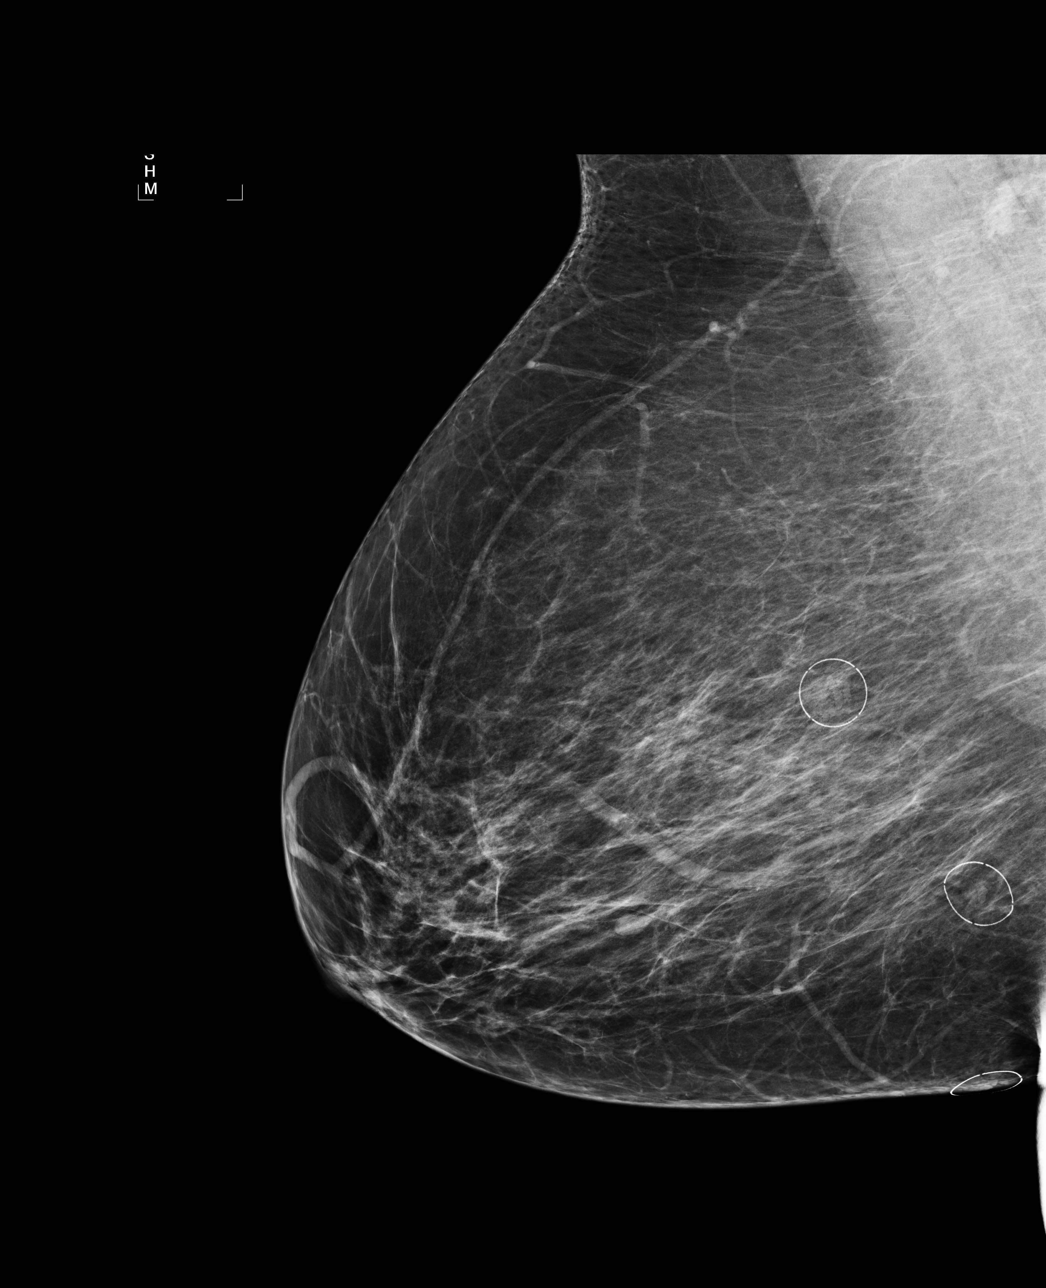

[4 of 4 positions shown; findings below may reference images not displayed]

IMPRESSION: No specific mammographic evidence of malignancy.  Next screening mammogram is recommended in one 
year.

ASSESSMENT: Negative - BI-RADS 1

Screening mammogram in 1 year.
ANALYZED BY COMPUTER AIDED DETECTION. , THIS PROCEDURE WAS A DIGITAL MAMMOGRAM.

## 2008-03-25 ENCOUNTER — Encounter: Payer: Self-pay | Admitting: Internal Medicine

## 2008-03-29 ENCOUNTER — Ambulatory Visit: Payer: Self-pay | Admitting: Internal Medicine

## 2008-03-29 IMAGING — CR DG CHEST 2V
2 series · 2 of 2 positions shown · non-contrast
Comparison: [DATE]

CLINICAL DATA: Physical exam

CHEST - 2 VIEW

[view not recorded (1 of 2)]
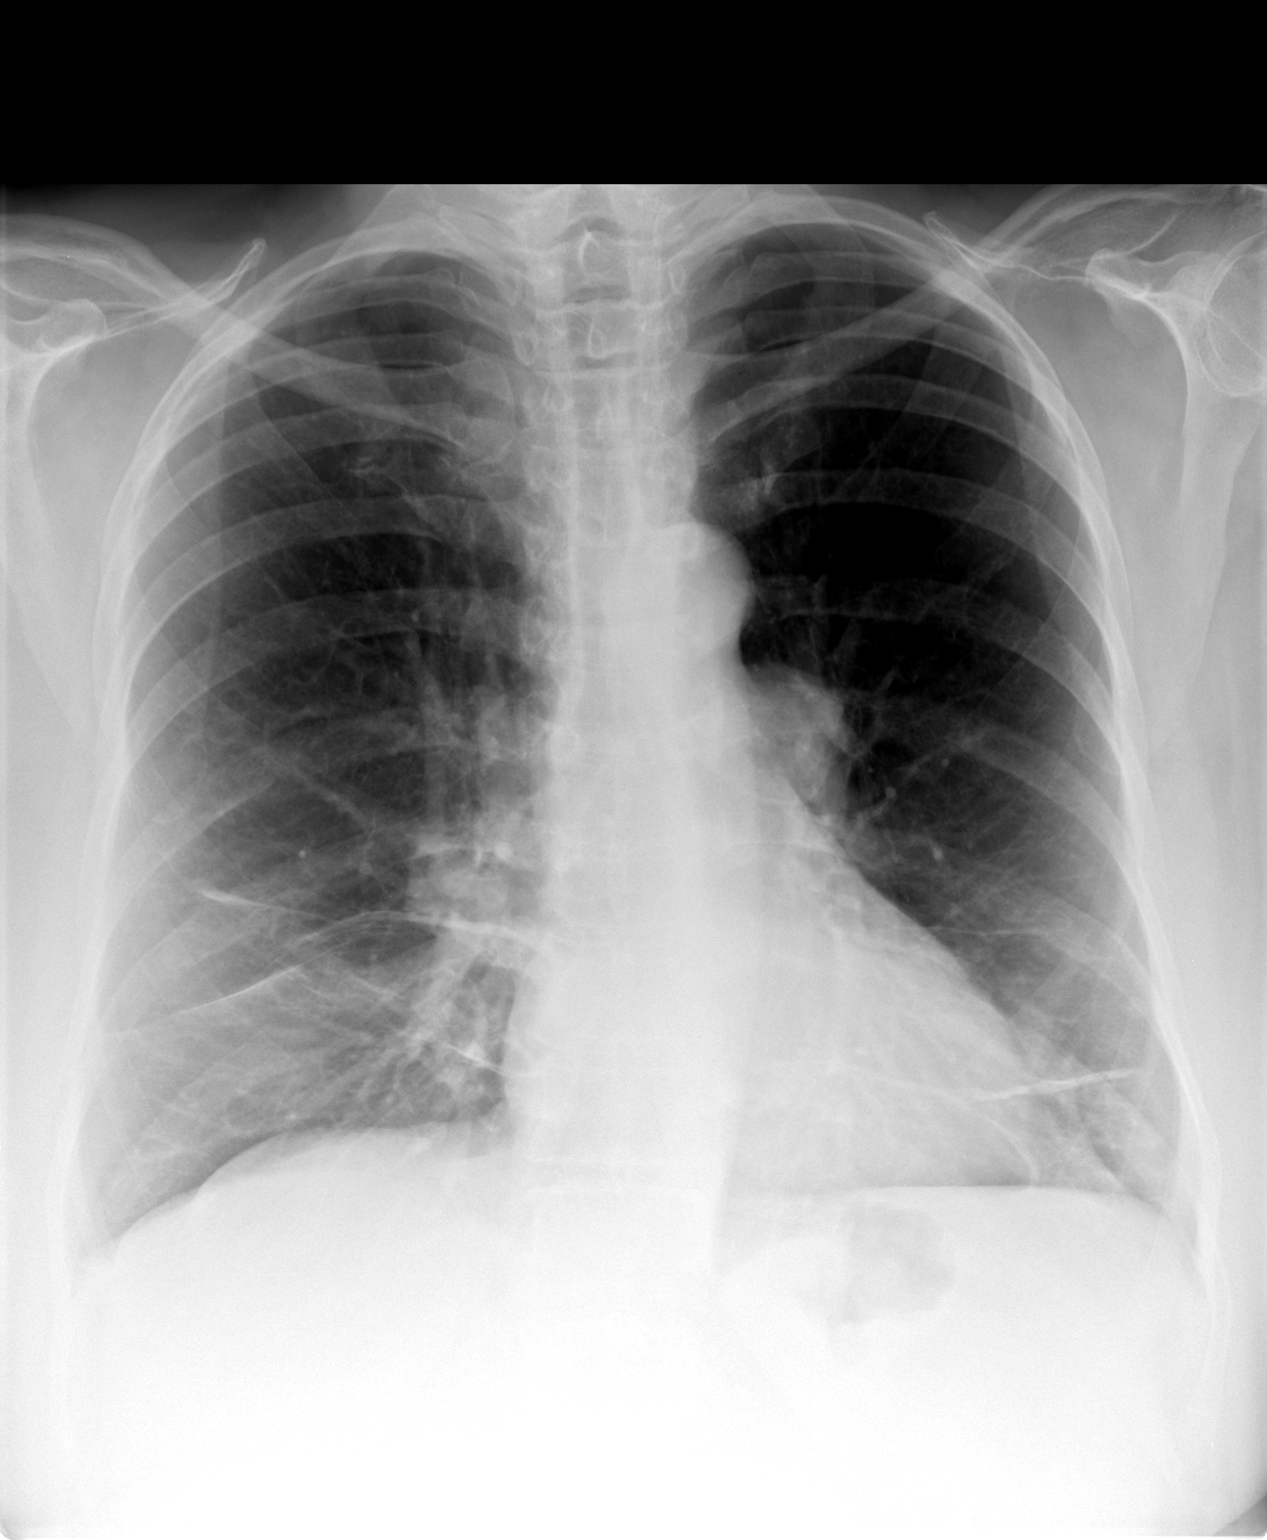

[view not recorded (2 of 2)]
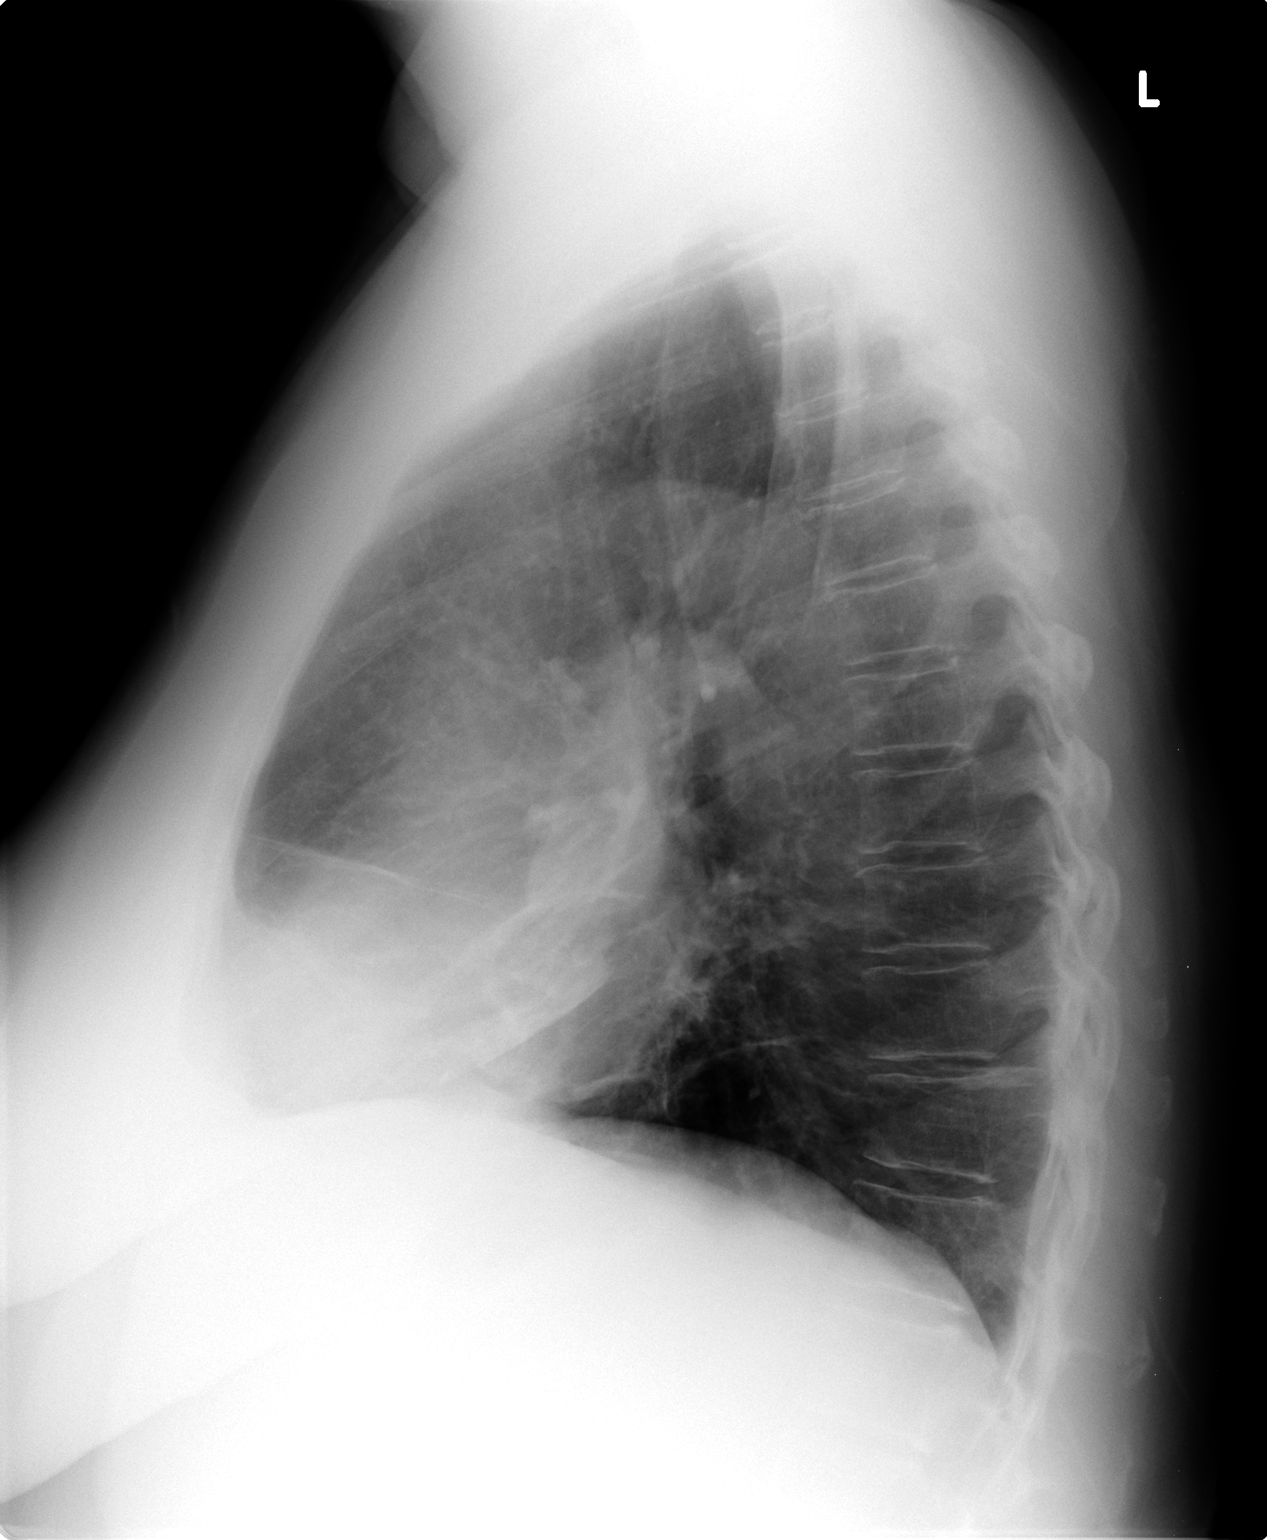

[2 of 2 positions shown; findings below may reference images not displayed]

FINDINGS: The cardiac silhouette, mediastinal and hilar contours
are stable.  Mildly prominent pulmonary arteries are again
demonstrated, left greater than right.  A dilated pulmonary vein
was noted on the right side on the previous chest CT.  There are
chronic lung changes with COPD and areas of scarring.  No acute
pulmonary findings.  The bony thorax is intact.
IMPRESSION: 1.  Stable prominent pulmonary hila.
2.  Chronic lung changes with COPD and scarring.  No definite acute
overlying pulmonary process.

## 2008-04-11 ENCOUNTER — Telehealth: Payer: Self-pay | Admitting: Internal Medicine

## 2008-04-12 ENCOUNTER — Ambulatory Visit: Payer: Self-pay | Admitting: Internal Medicine

## 2008-04-12 ENCOUNTER — Encounter: Payer: Self-pay | Admitting: Internal Medicine

## 2008-04-12 DIAGNOSIS — R0602 Shortness of breath: Secondary | ICD-10-CM

## 2008-04-24 ENCOUNTER — Ambulatory Visit: Payer: Self-pay | Admitting: Internal Medicine

## 2008-04-24 ENCOUNTER — Telehealth (INDEPENDENT_AMBULATORY_CARE_PROVIDER_SITE_OTHER): Payer: Self-pay | Admitting: *Deleted

## 2008-04-26 ENCOUNTER — Ambulatory Visit: Payer: Self-pay | Admitting: Internal Medicine

## 2008-05-10 ENCOUNTER — Ambulatory Visit: Payer: Self-pay | Admitting: Internal Medicine

## 2008-05-23 ENCOUNTER — Ambulatory Visit: Payer: Self-pay | Admitting: Internal Medicine

## 2008-06-07 ENCOUNTER — Ambulatory Visit: Payer: Self-pay | Admitting: Internal Medicine

## 2008-06-21 ENCOUNTER — Ambulatory Visit: Payer: Self-pay | Admitting: Internal Medicine

## 2008-06-21 ENCOUNTER — Ambulatory Visit: Payer: Self-pay | Admitting: Family Medicine

## 2008-06-21 DIAGNOSIS — R1032 Left lower quadrant pain: Secondary | ICD-10-CM | POA: Insufficient documentation

## 2008-06-21 LAB — CONVERTED CEMR LAB
Bilirubin Urine: NEGATIVE
Blood in Urine, dipstick: NEGATIVE
Glucose, Urine, Semiquant: NEGATIVE
Protein, U semiquant: NEGATIVE
Urobilinogen, UA: 0.2
pH: 7

## 2008-07-05 ENCOUNTER — Ambulatory Visit: Payer: Self-pay | Admitting: Internal Medicine

## 2008-07-19 ENCOUNTER — Ambulatory Visit: Payer: Self-pay | Admitting: Internal Medicine

## 2008-08-02 ENCOUNTER — Ambulatory Visit: Payer: Self-pay | Admitting: Internal Medicine

## 2008-08-15 ENCOUNTER — Ambulatory Visit: Payer: Self-pay | Admitting: Internal Medicine

## 2008-08-30 ENCOUNTER — Ambulatory Visit: Payer: Self-pay | Admitting: Pulmonary Disease

## 2008-09-13 ENCOUNTER — Ambulatory Visit: Payer: Self-pay | Admitting: Internal Medicine

## 2008-09-25 ENCOUNTER — Ambulatory Visit: Payer: Self-pay | Admitting: Internal Medicine

## 2008-09-27 ENCOUNTER — Ambulatory Visit: Payer: Self-pay | Admitting: Internal Medicine

## 2008-10-11 ENCOUNTER — Ambulatory Visit: Payer: Self-pay | Admitting: Internal Medicine

## 2008-10-25 ENCOUNTER — Ambulatory Visit: Payer: Self-pay | Admitting: Internal Medicine

## 2008-11-08 ENCOUNTER — Ambulatory Visit: Payer: Self-pay | Admitting: Internal Medicine

## 2008-11-27 ENCOUNTER — Ambulatory Visit: Payer: Self-pay | Admitting: Internal Medicine

## 2008-12-13 ENCOUNTER — Ambulatory Visit: Payer: Self-pay | Admitting: Internal Medicine

## 2008-12-27 ENCOUNTER — Ambulatory Visit: Payer: Self-pay | Admitting: Internal Medicine

## 2009-01-10 ENCOUNTER — Ambulatory Visit: Payer: Self-pay | Admitting: Internal Medicine

## 2009-01-24 ENCOUNTER — Ambulatory Visit: Payer: Self-pay | Admitting: Internal Medicine

## 2009-02-07 ENCOUNTER — Ambulatory Visit: Payer: Self-pay | Admitting: Internal Medicine

## 2009-02-21 ENCOUNTER — Ambulatory Visit: Payer: Self-pay | Admitting: Internal Medicine

## 2009-03-07 ENCOUNTER — Ambulatory Visit: Payer: Self-pay | Admitting: Internal Medicine

## 2009-03-21 ENCOUNTER — Ambulatory Visit: Payer: Self-pay | Admitting: Internal Medicine

## 2009-03-26 ENCOUNTER — Encounter: Admission: RE | Admit: 2009-03-26 | Discharge: 2009-03-26 | Payer: Self-pay | Admitting: Obstetrics and Gynecology

## 2009-03-26 ENCOUNTER — Ambulatory Visit: Payer: Self-pay | Admitting: Internal Medicine

## 2009-03-26 IMAGING — MG MM DIGITAL SCREENING BILAT W/ CAD
4 series · 4 of 4 positions shown · non-contrast
Comparison: none

DG SCREEN MAMMOGRAM BILATERAL
Bilateral CC and MLO view(s) were taken.

DIGITAL SCREENING MAMMOGRAM WITH CAD:
There are scattered fibroglandular densities.  No masses or malignant type calcifications are 
identified.  Compared with prior studies.
Images were processed with CAD.

[R CC]
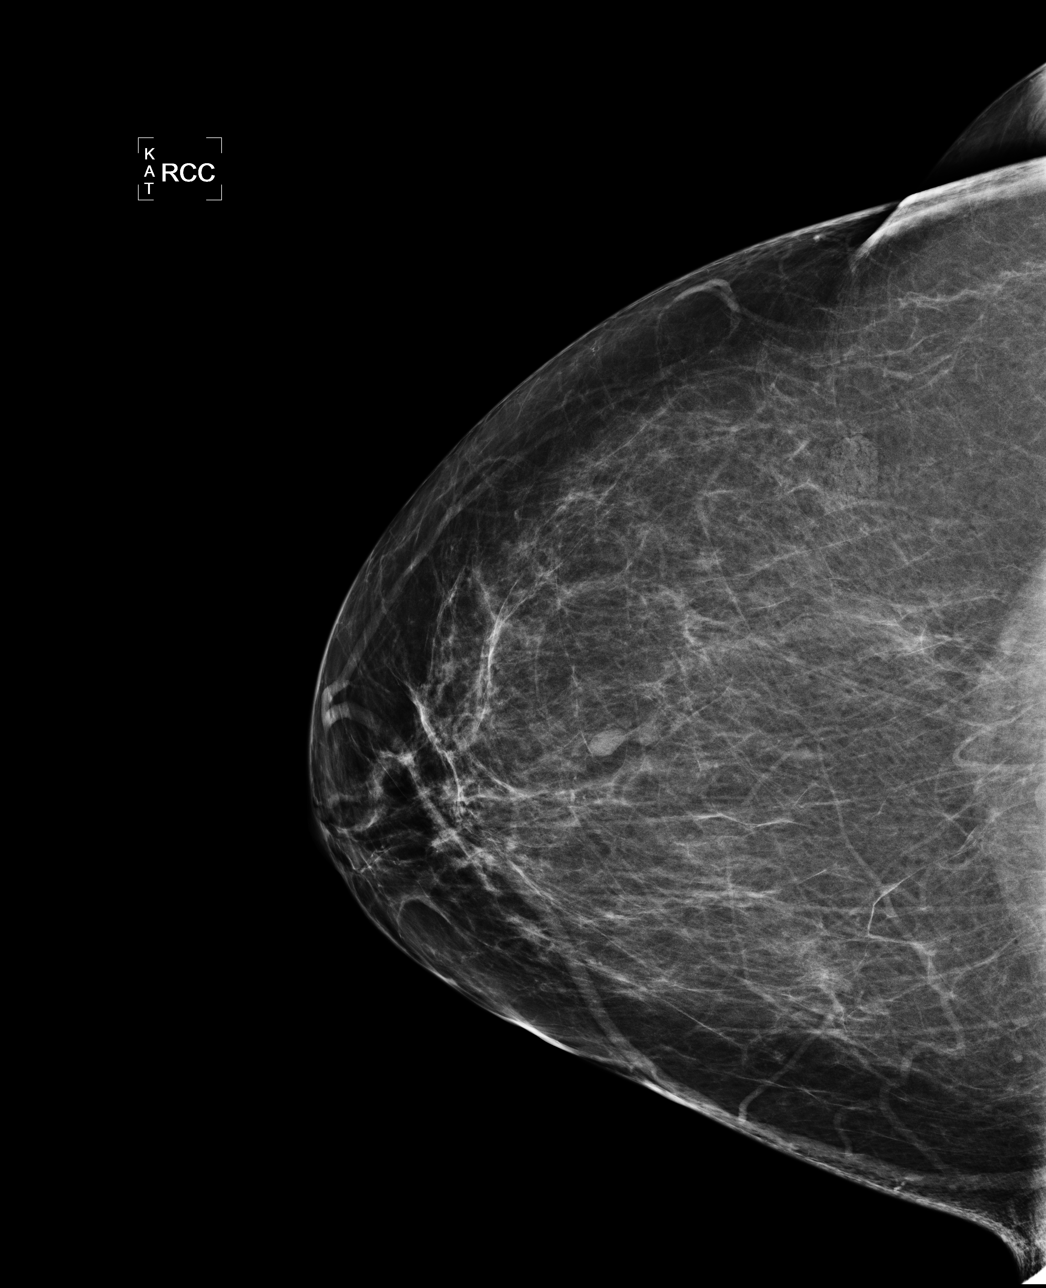

[L CC]
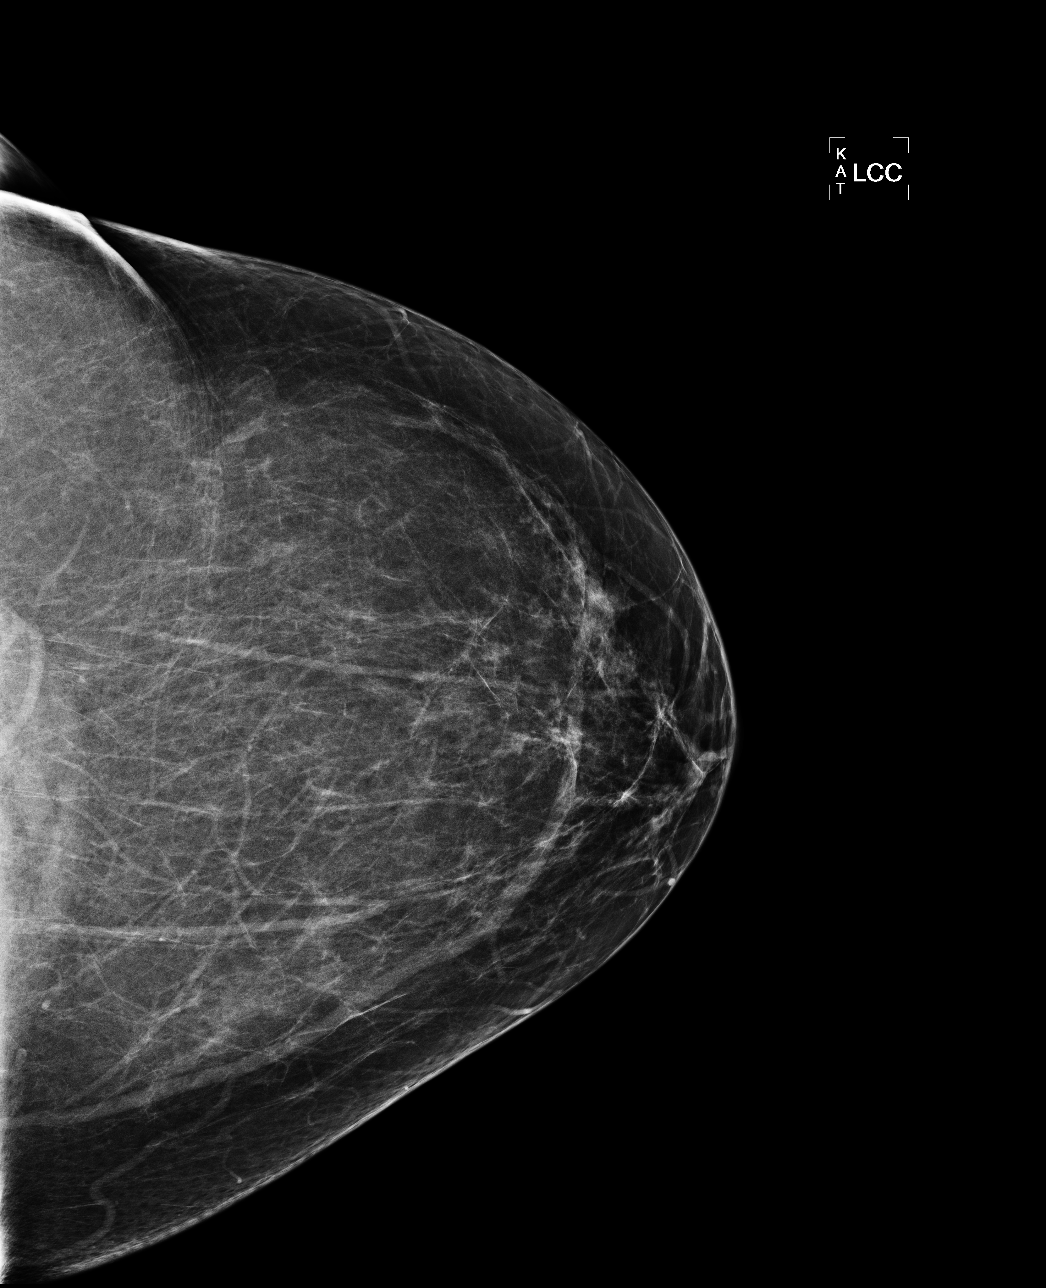

[L MLO]
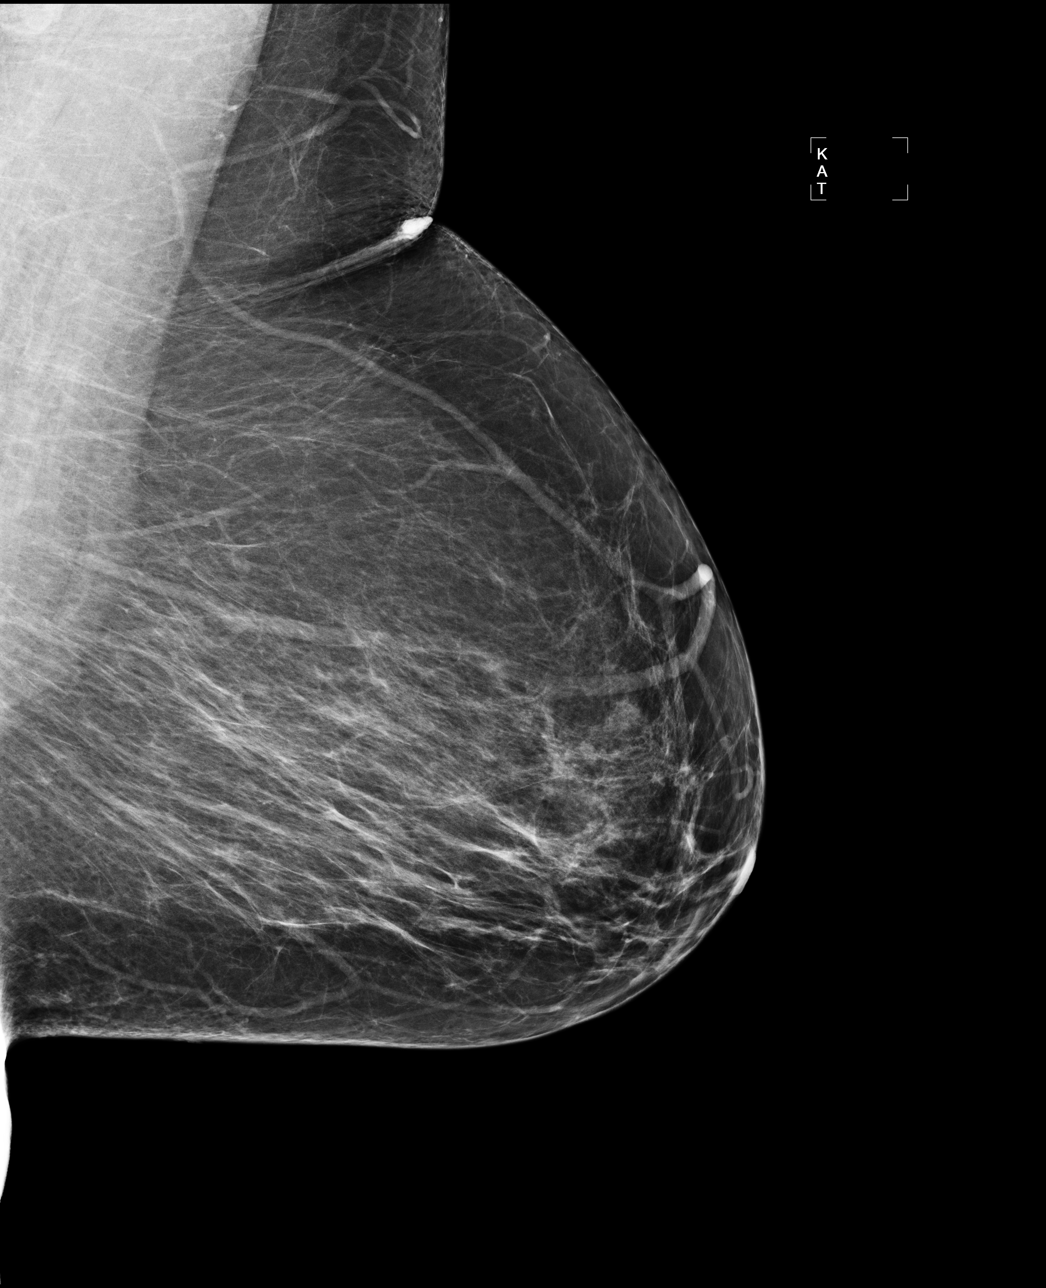

[R MLO]
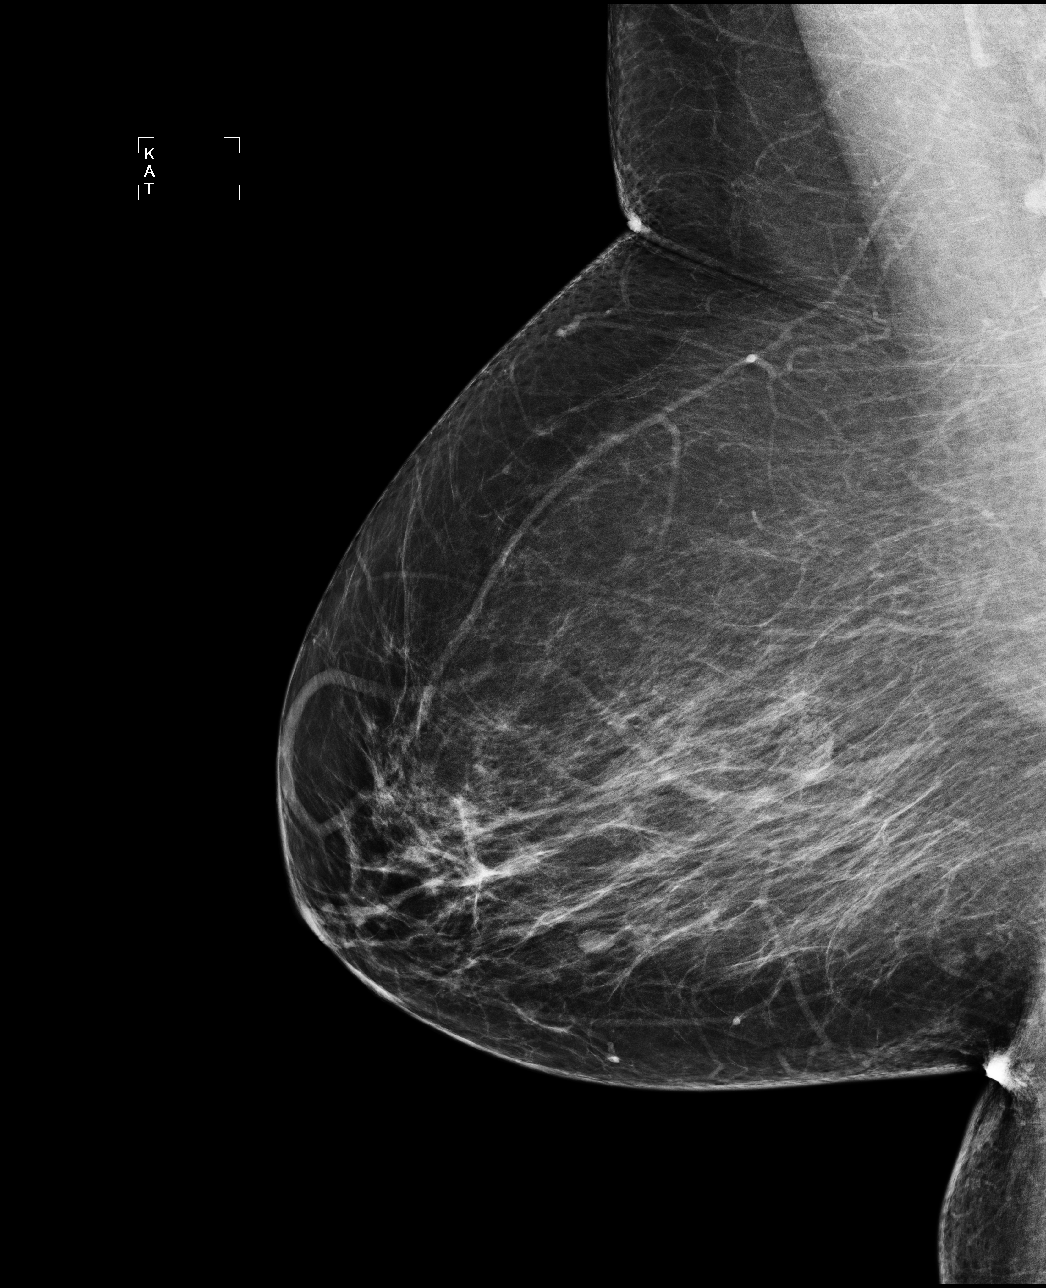

[4 of 4 positions shown; findings below may reference images not displayed]

IMPRESSION: No specific mammographic evidence of malignancy.  Next screening mammogram is recommended in one 
year.

A result letter of this screening mammogram will be mailed directly to the patient.

ASSESSMENT: Negative - BI-RADS 1

Screening mammogram in 1 year.
,

## 2009-04-04 ENCOUNTER — Ambulatory Visit: Payer: Self-pay | Admitting: Internal Medicine

## 2009-04-18 ENCOUNTER — Ambulatory Visit: Payer: Self-pay | Admitting: Internal Medicine

## 2009-04-23 ENCOUNTER — Encounter: Payer: Self-pay | Admitting: Family Medicine

## 2009-04-23 ENCOUNTER — Ambulatory Visit: Payer: Self-pay | Admitting: Internal Medicine

## 2009-05-02 ENCOUNTER — Ambulatory Visit: Payer: Self-pay | Admitting: Internal Medicine

## 2009-05-19 ENCOUNTER — Ambulatory Visit: Payer: Self-pay | Admitting: Internal Medicine

## 2009-05-30 ENCOUNTER — Ambulatory Visit: Payer: Self-pay | Admitting: Internal Medicine

## 2009-06-13 ENCOUNTER — Ambulatory Visit: Payer: Self-pay | Admitting: Internal Medicine

## 2009-06-27 ENCOUNTER — Ambulatory Visit: Payer: Self-pay | Admitting: Internal Medicine

## 2009-07-11 ENCOUNTER — Ambulatory Visit: Payer: Self-pay | Admitting: Internal Medicine

## 2009-07-25 ENCOUNTER — Ambulatory Visit: Payer: Self-pay | Admitting: Internal Medicine

## 2009-08-08 ENCOUNTER — Ambulatory Visit: Payer: Self-pay | Admitting: Internal Medicine

## 2009-08-22 ENCOUNTER — Telehealth: Payer: Self-pay | Admitting: Internal Medicine

## 2009-08-22 ENCOUNTER — Ambulatory Visit: Payer: Self-pay | Admitting: Internal Medicine

## 2009-09-04 ENCOUNTER — Ambulatory Visit: Payer: Self-pay | Admitting: Internal Medicine

## 2009-09-18 ENCOUNTER — Ambulatory Visit: Payer: Self-pay | Admitting: Family Medicine

## 2009-09-18 LAB — CONVERTED CEMR LAB
AST: 31 units/L (ref 0–37)
BUN: 15 mg/dL (ref 6–23)
Basophils Absolute: 0 10*3/uL (ref 0.0–0.1)
Blood in Urine, dipstick: NEGATIVE
Calcium: 9.6 mg/dL (ref 8.4–10.5)
Cholesterol: 232 mg/dL — ABNORMAL HIGH (ref 0–200)
Creatinine, Ser: 0.8 mg/dL (ref 0.4–1.2)
GFR calc non Af Amer: 75.59 mL/min (ref >60)
Glucose, Bld: 91 mg/dL (ref 70–99)
Glucose, Urine, Semiquant: NEGATIVE
HCT: 46.1 % — ABNORMAL HIGH (ref 36.0–46.0)
HDL: 42.5 mg/dL (ref >39.00)
Ketones, urine, test strip: NEGATIVE
Lymphocytes Relative: 26.5 % (ref 12.0–46.0)
Lymphs Abs: 1.8 10*3/uL (ref 0.7–4.0)
Monocytes Relative: 7.9 % (ref 3.0–12.0)
Neutrophils Relative %: 61.4 % (ref 43.0–77.0)
Platelets: 257 10*3/uL (ref 150.0–400.0)
RDW: 13.6 % (ref 11.5–14.6)
Specific Gravity, Urine: 1.02
TSH: 3.12 microintl units/mL (ref 0.35–5.50)
Total Bilirubin: 0.6 mg/dL (ref 0.3–1.2)
Triglycerides: 313 mg/dL — ABNORMAL HIGH (ref 0.0–149.0)
pH: 6.5

## 2009-09-19 ENCOUNTER — Ambulatory Visit: Payer: Self-pay | Admitting: Internal Medicine

## 2009-09-25 ENCOUNTER — Ambulatory Visit: Payer: Self-pay | Admitting: Family Medicine

## 2009-10-02 ENCOUNTER — Ambulatory Visit: Payer: Self-pay | Admitting: Internal Medicine

## 2009-11-07 ENCOUNTER — Ambulatory Visit: Payer: Self-pay | Admitting: Internal Medicine

## 2009-11-21 ENCOUNTER — Ambulatory Visit: Payer: Self-pay | Admitting: Internal Medicine

## 2009-12-05 ENCOUNTER — Ambulatory Visit: Payer: Self-pay | Admitting: Internal Medicine

## 2009-12-19 ENCOUNTER — Ambulatory Visit: Payer: Self-pay | Admitting: Internal Medicine

## 2010-01-02 ENCOUNTER — Ambulatory Visit: Payer: Self-pay | Admitting: Internal Medicine

## 2010-01-16 ENCOUNTER — Ambulatory Visit: Payer: Self-pay | Admitting: Internal Medicine

## 2010-01-23 ENCOUNTER — Ambulatory Visit: Payer: Self-pay | Admitting: Internal Medicine

## 2010-01-23 IMAGING — CR DG CHEST 2V
2 series · 2 of 2 positions shown · non-contrast
Comparison: [DATE]

CLINICAL DATA: Follow-up COPD.  Physical exam

CHEST - 2 VIEW

[view not recorded (1 of 2)]
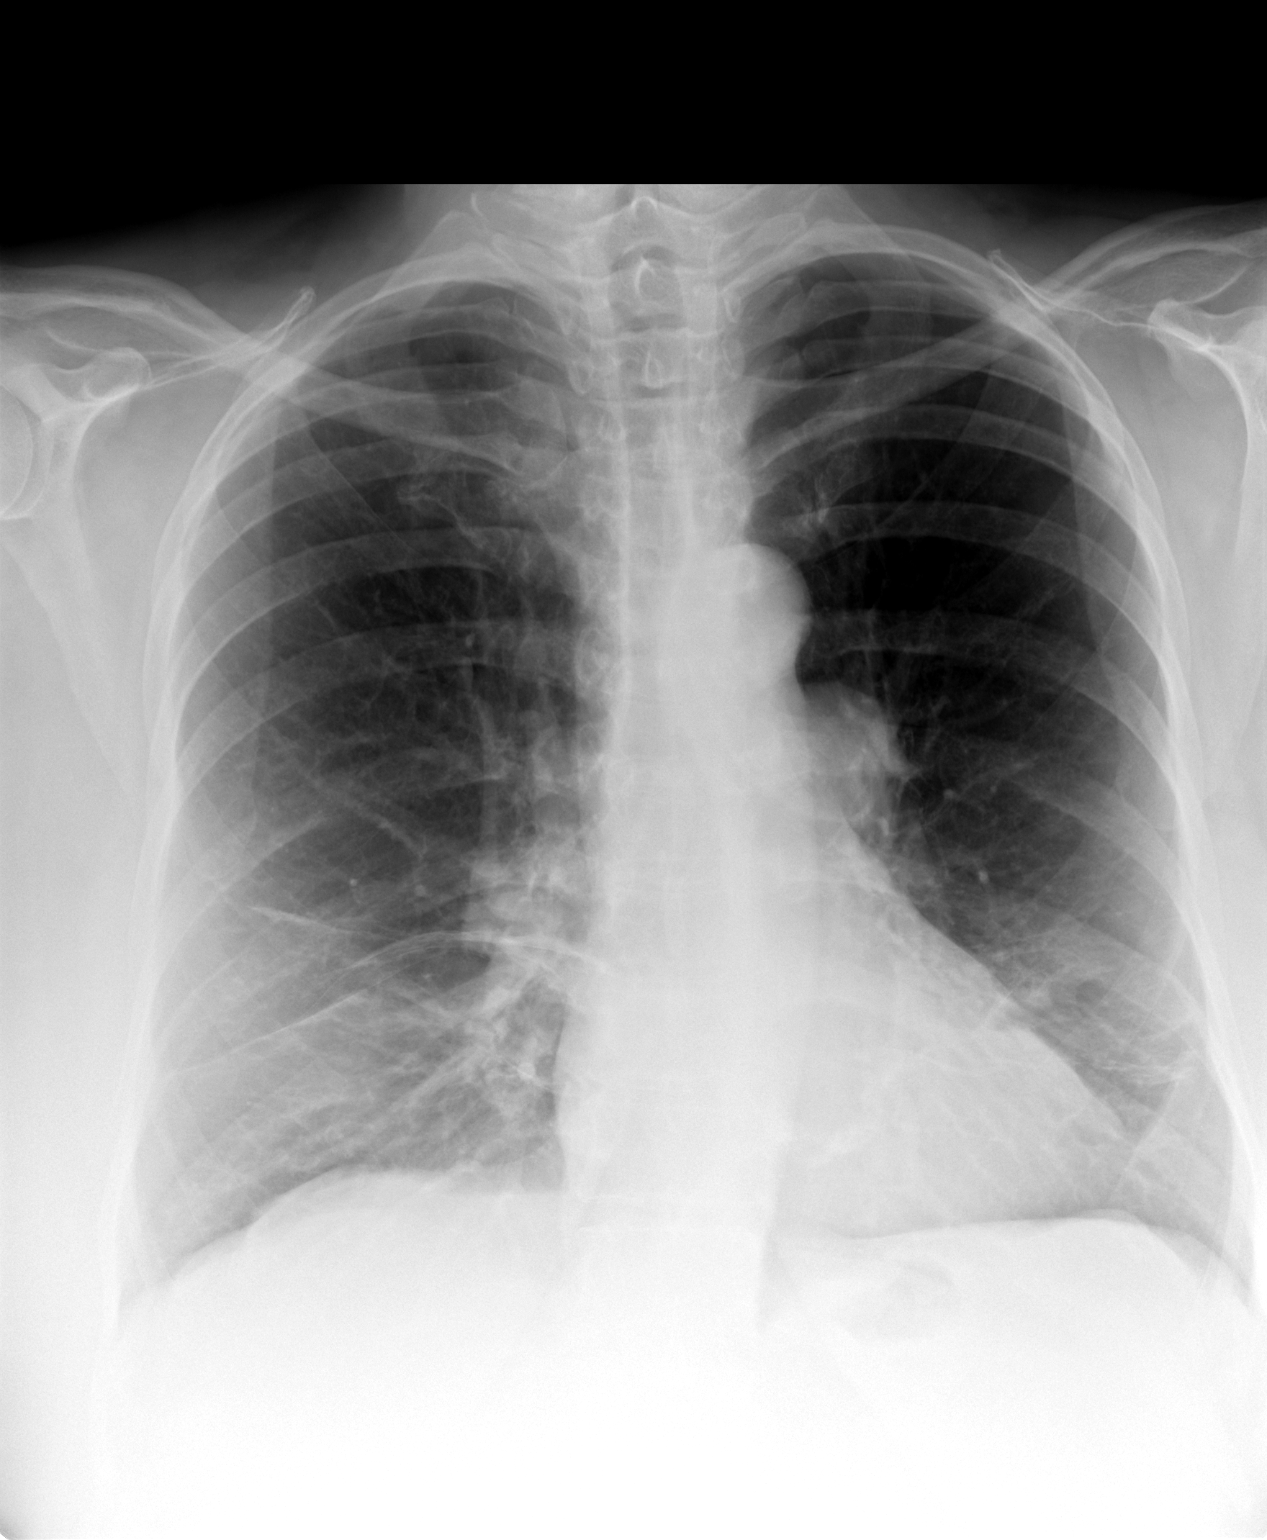

[view not recorded (2 of 2)]
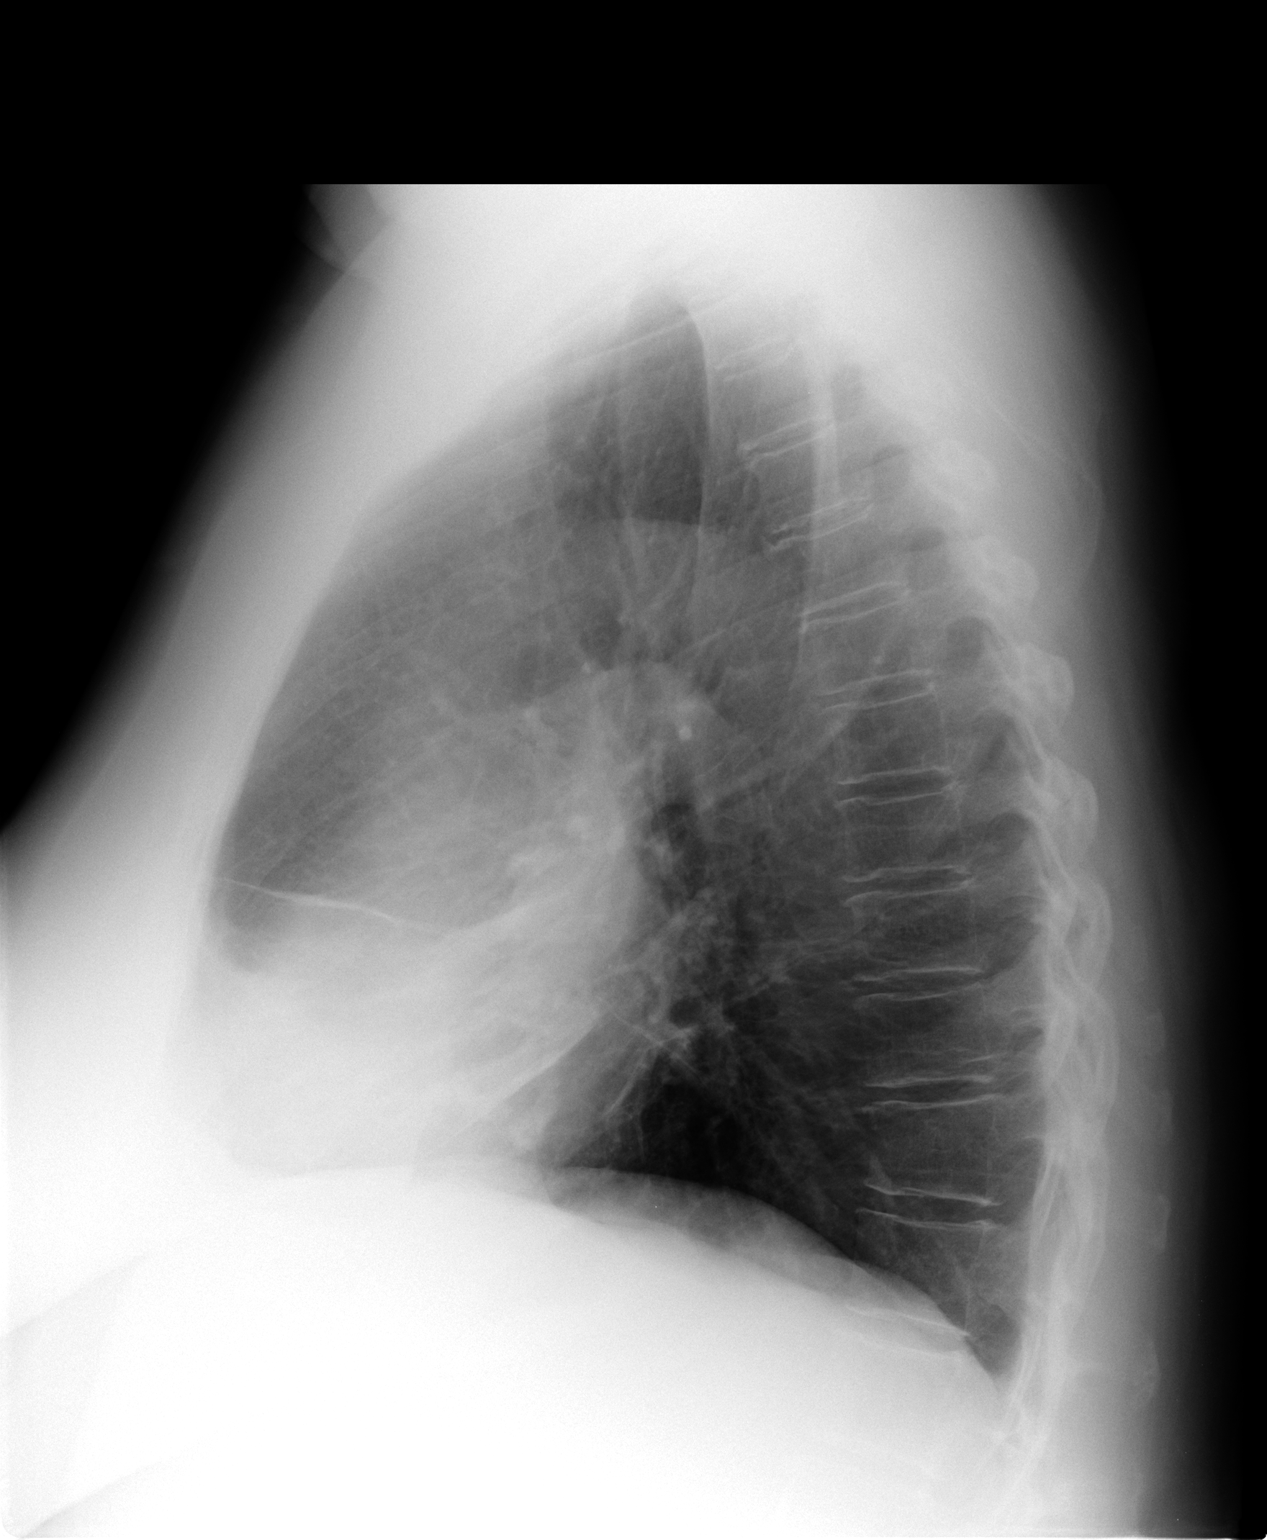

[2 of 2 positions shown; findings below may reference images not displayed]

FINDINGS: Changes of COPD with a stable degree of hyperinflation is
noted.  Prominence of the pulmonary arterial segments are seen
compatible with some degree of pulmonary arterial hypertension.
This finding is stable as well.  Areas of linear scarring in both
lower lung zones is also unchanged.

Taking the degree of hyperinflation into consideration, heart size
is upper limits of normal to mildly enlarged.  No pleural fluid or
significant peribronchial cuffing is seen.

Bony structures appear intact.
IMPRESSION: Stable COPD changes with unchanged prominence of the pulmonary
arteries centrally suspicious for pulmonary arterial hypertension.
No new focal or acute abnormality suggested.

## 2010-01-30 ENCOUNTER — Ambulatory Visit: Payer: Self-pay | Admitting: Internal Medicine

## 2010-02-13 ENCOUNTER — Ambulatory Visit: Payer: Self-pay | Admitting: Internal Medicine

## 2010-02-27 ENCOUNTER — Ambulatory Visit: Payer: Self-pay | Admitting: Internal Medicine

## 2010-03-13 ENCOUNTER — Ambulatory Visit: Payer: Self-pay | Admitting: Internal Medicine

## 2010-03-27 ENCOUNTER — Ambulatory Visit: Payer: Self-pay | Admitting: Internal Medicine

## 2010-04-01 ENCOUNTER — Encounter: Admission: RE | Admit: 2010-04-01 | Discharge: 2010-04-01 | Payer: Self-pay | Admitting: Obstetrics and Gynecology

## 2010-04-01 IMAGING — MG MM DIGITAL SCREENING BILAT W/ CAD
4 series · 4 of 4 positions shown · non-contrast
Comparison: none

DG SCREEN MAMMOGRAM BILATERAL
Bilateral CC and MLO view(s) were taken.

DIGITAL SCREENING MAMMOGRAM WITH CAD:
There are scattered fibroglandular densities.  No masses or malignant type calcifications are 
identified.  Compared with prior studies.
Images were processed with CAD.

[R CC]
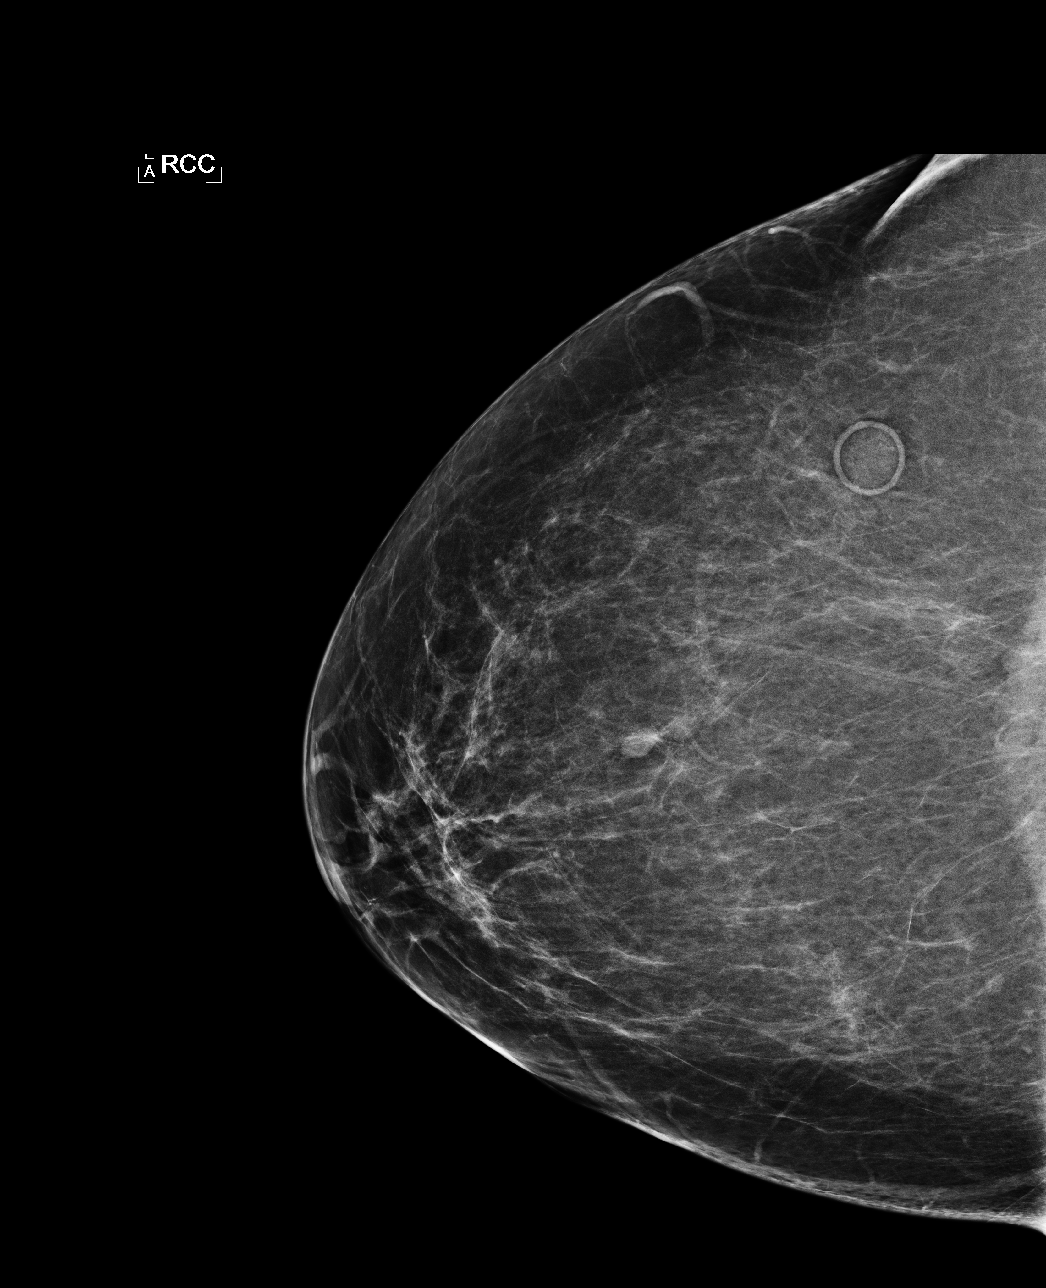

[L CC]
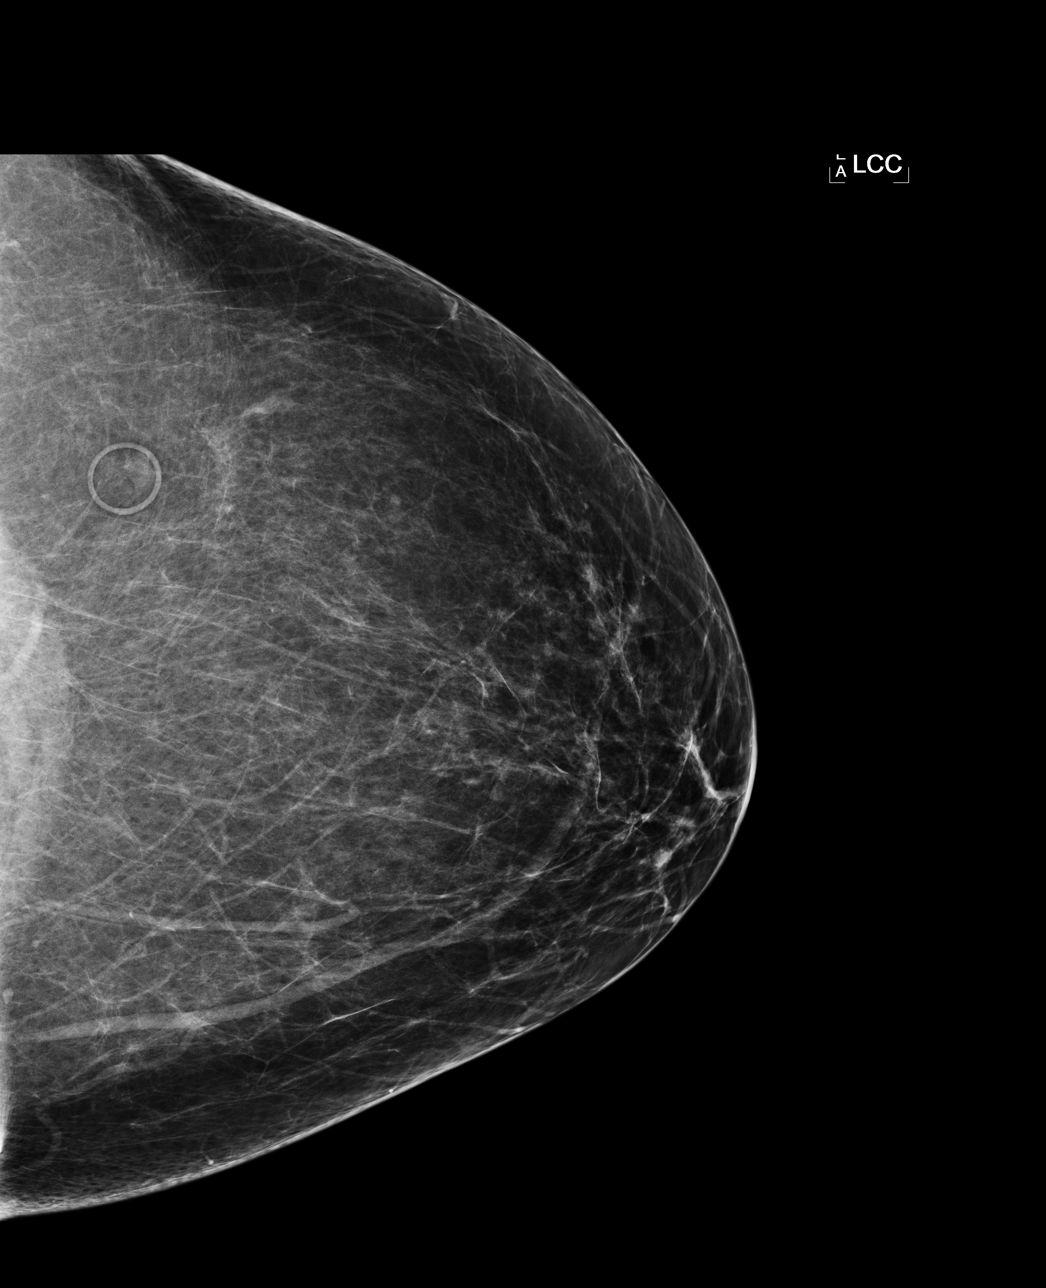

[L MLO]
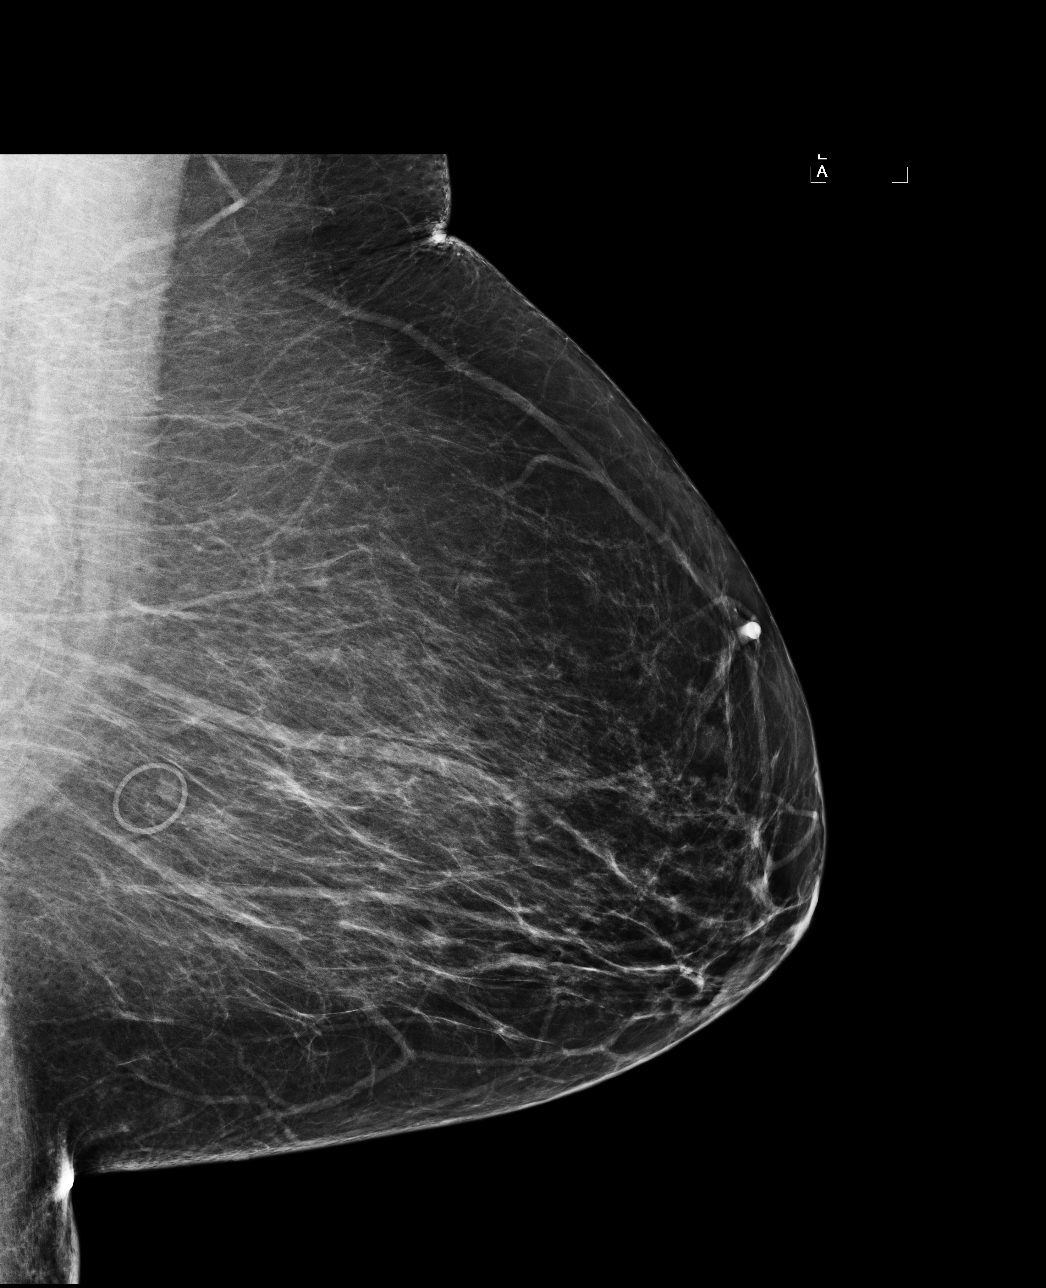

[R MLO]
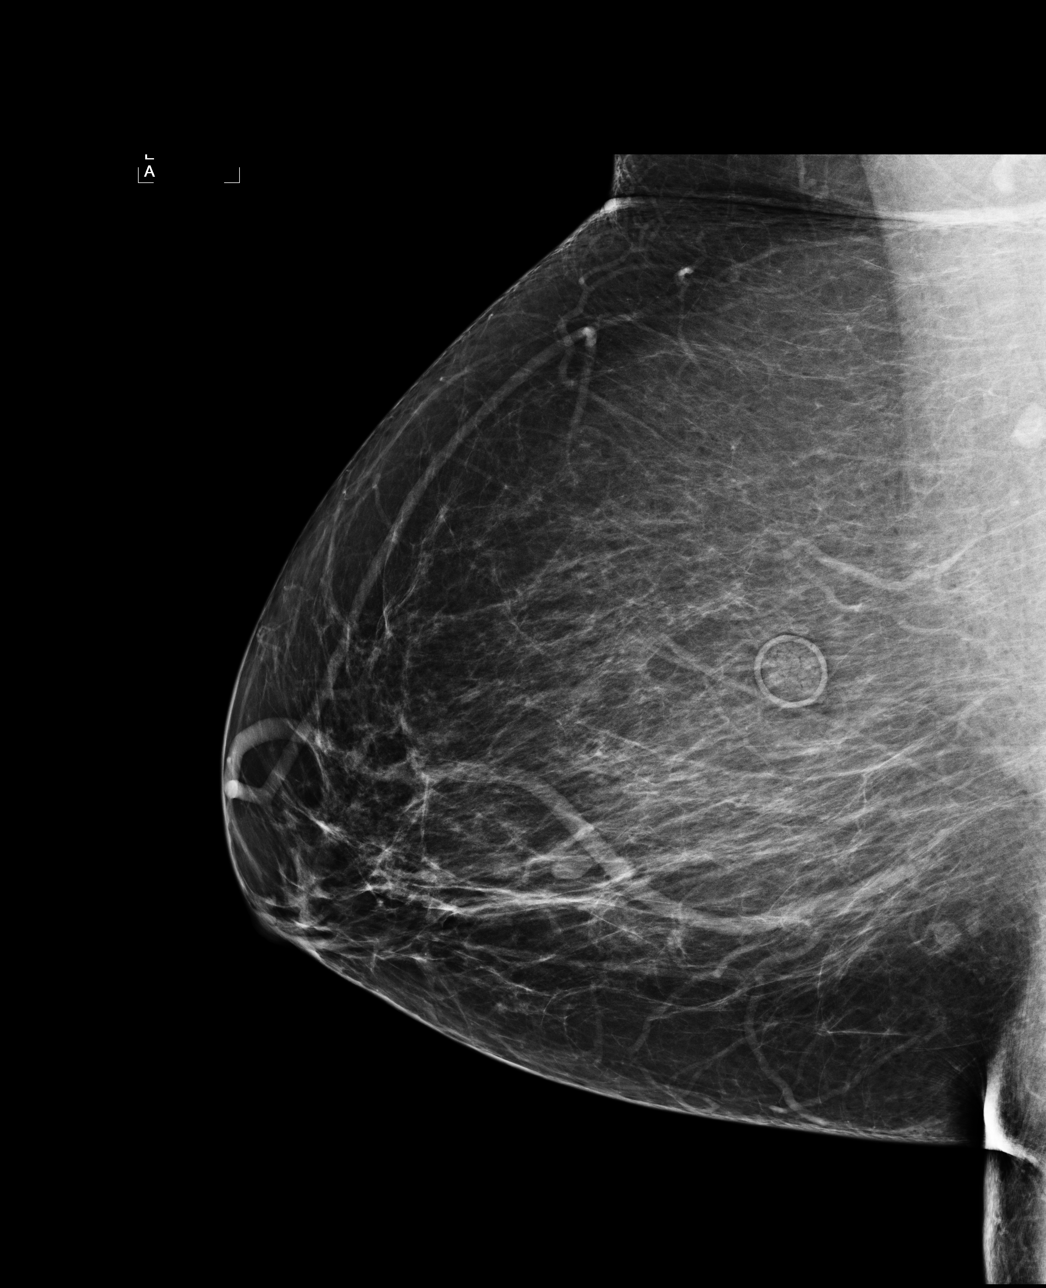

[4 of 4 positions shown; findings below may reference images not displayed]

IMPRESSION: No specific mammographic evidence of malignancy.  Next screening mammogram is recommended in one 
year.

A result letter of this screening mammogram will be mailed directly to the patient.

ASSESSMENT: Negative - BI-RADS 1

Screening mammogram in 1 year.
,

## 2010-04-10 ENCOUNTER — Ambulatory Visit: Payer: Self-pay | Admitting: Internal Medicine

## 2010-04-15 ENCOUNTER — Telehealth: Payer: Self-pay | Admitting: Internal Medicine

## 2010-04-24 ENCOUNTER — Ambulatory Visit: Payer: Self-pay | Admitting: Internal Medicine

## 2010-05-08 ENCOUNTER — Ambulatory Visit: Payer: Self-pay | Admitting: Internal Medicine

## 2010-05-22 ENCOUNTER — Ambulatory Visit: Payer: Self-pay | Admitting: Internal Medicine

## 2010-06-05 ENCOUNTER — Encounter: Payer: Self-pay | Admitting: Internal Medicine

## 2010-06-05 ENCOUNTER — Ambulatory Visit
Admission: RE | Admit: 2010-06-05 | Discharge: 2010-06-05 | Payer: Self-pay | Source: Home / Self Care | Attending: Internal Medicine | Admitting: Internal Medicine

## 2010-06-14 ENCOUNTER — Encounter: Payer: Self-pay | Admitting: Obstetrics and Gynecology

## 2010-06-19 ENCOUNTER — Ambulatory Visit
Admission: RE | Admit: 2010-06-19 | Discharge: 2010-06-19 | Payer: Self-pay | Source: Home / Self Care | Attending: Internal Medicine | Admitting: Internal Medicine

## 2010-06-21 LAB — CONVERTED CEMR LAB
ALT: 37 units/L — ABNORMAL HIGH (ref 0–35)
AST: 36 units/L (ref 0–37)
Basophils Relative: 0.6 % (ref 0.0–1.0)
Bilirubin, Direct: 0.1 mg/dL (ref 0.0–0.3)
Calcium: 9.9 mg/dL (ref 8.4–10.5)
Chloride: 106 meq/L (ref 96–112)
Creatinine, Ser: 0.8 mg/dL (ref 0.4–1.2)
Eosinophils Absolute: 0.3 10*3/uL (ref 0.0–0.6)
Eosinophils Relative: 3.7 % (ref 0.0–5.0)
GFR calc non Af Amer: 76 mL/min
Glucose, Bld: 97 mg/dL (ref 70–99)
HCT: 44.4 % (ref 36.0–46.0)
HDL: 34.1 mg/dL — ABNORMAL LOW (ref >39.0)
MCV: 89.9 fL (ref 78.0–100.0)
Neutrophils Relative %: 59.9 % (ref 43.0–77.0)
Platelets: 237 10*3/uL (ref 150–400)
RBC: 4.94 M/uL (ref 3.87–5.11)
RDW: 12.4 % (ref 11.5–14.6)
Sodium: 144 meq/L (ref 135–145)
Total Bilirubin: 0.9 mg/dL (ref 0.3–1.2)
Total CHOL/HDL Ratio: 6.4
Triglycerides: 245 mg/dL (ref 0–149)
VLDL: 49 mg/dL — ABNORMAL HIGH (ref 0–40)
WBC: 6.8 10*3/uL (ref 4.5–10.5)

## 2010-06-23 NOTE — Miscellaneous (Signed)
Summary: Injection Record / [facility]    Injection Record / [facility]    Imported By: Lennie Odor 10/13/2009 15:29:40  _____________________________________________________________________  External Attachment:    Type:   Image     Comment:   External Document

## 2010-06-23 NOTE — Assessment & Plan Note (Signed)
Summary: xolair/apc   Nurse Visit   Allergies: 1)  ! Biaxin 2)  Aspirin (Aspirin) 3)  Codeine Phosphate (Codeine Phosphate)  Medication Administration  Injection # 1:    Medication: Xolair (omalizumab) 150mg     Diagnosis: EXTRINSIC ASTHMA, UNSPECIFIED (ICD-493.00)    Route: SQ    Site: R deltoid    Exp Date: 09/21/2012    Lot #: [id number]    Mfr: genentech    Comments: 1.41ml in right arm and left arm pt waited 30 mins    Patient tolerated injection without complications    Given by: TAMMY SCOTT IN [facility]ERGY [facility]  Orders Added: 1)  Xolair (omalizumab) 150mg  [J2357] 2)  Administration xolair injection [04540]   Medication Administration  Injection # 1:    Medication: Xolair (omalizumab) 150mg     Diagnosis: EXTRINSIC ASTHMA, UNSPECIFIED (ICD-493.00)    Route: SQ    Site: R deltoid    Exp Date: 09/21/2012    Lot #: [id number]    Mfr: genentech    Comments: 1.49ml in right arm and left arm pt waited 30 mins    Patient tolerated injection without complications    Given by: TAMMY SCOTT IN ALLERGY LAB  Orders Added: 1)  Xolair (omalizumab) 150mg  [J2357] 2)  Administration xolair injection [[id number]]

## 2010-06-23 NOTE — Assessment & Plan Note (Signed)
Summary: XOLAIR///KP   Nurse Visit   Allergies: 1)  ! Biaxin 2)  Aspirin (Aspirin) 3)  Codeine Phosphate (Codeine Phosphate)  Medication Administration  Injection # 1:    Medication: Xolair (omalizumab) 150mg     Diagnosis: EXTRINSIC ASTHMA, UNSPECIFIED (ICD-493.00)    Route: IM    Site: R deltoid    Exp Date: 02/21/2013    Lot #: [id number]    Mfr: Salome Spotted    Comments: xolair 300 mg 60 units, 1.2 ml x 1 in right deltoid, and 1.2 ml x 1 in left deltoid Pt waited 30 waited.    Given by: Dimas Millin, allergy tech  Orders Added: 1)  Xolair (omalizumab) 150mg  [J2357] 2)  Administration xolair injection [phone]

## 2010-06-23 NOTE — Assessment & Plan Note (Signed)
Summary: xolair/apc   Nurse Visit   Allergies: 1)  ! Biaxin 2)  Aspirin (Aspirin) 3)  Codeine Phosphate (Codeine Phosphate)  Medication Administration  Injection # 1:    Medication: Xolair (omalizumab) 150mg     Diagnosis: EXTRINSIC ASTHMA, UNSPECIFIED (ICD-493.00)    Route: SQ    Site: L deltoid    Exp Date: 07/24/2012    Lot #: [id number]    Mfr: GENENTECH    Comments: 1.2ML IN LEFT AND RIGHT ARM PT WAITED 30 MINS    Patient tolerated injection without complications    Given by: TAMMY SCOTT IN [facility]ERGY [facility]  Orders Added: 1)  Xolair (omalizumab) 150mg  [J2357] 2)  Administration xolair injection [id number]   Medication Administration  Injection # 1:    Medication: Xolair (omalizumab) 150mg     Diagnosis: EXTRINSIC ASTHMA, UNSPECIFIED (ICD-493.00)    Route: SQ    Site: L deltoid    Exp Date: 07/24/2012    Lot #: [id number]    Mfr: GENENTECH    Comments: 1.2ML IN LEFT AND RIGHT ARM PT WAITED 30 MINS    Patient tolerated injection without complications    Given by: TAMMY SCOTT IN [facility]ERGY LAB  Orders Added: 1)  Xolair (omalizumab) 150mg  [J2357] 2)  Administration xolair injection [[id number]]

## 2010-06-23 NOTE — Assessment & Plan Note (Signed)
Summary: xolair/ mbw   Nurse Visit   Allergies: 1)  ! Biaxin 2)  Aspirin (Aspirin) 3)  Codeine Phosphate (Codeine Phosphate)  Medication Administration  Injection # 1:    Medication: Xolair (omalizumab) 150mg     Diagnosis: EXTRINSIC ASTHMA, UNSPECIFIED (ICD-493.00)    Route: SQ    Site: R deltoid    Exp Date: 07/24/2012    Lot #: [id number]    Mfr: GENENTECH    Comments: 1.2 ML IN RIGHT AND LEFT ARM PT WAITED 30 MINS    Patient tolerated injection without complications    Given by: TAMMY SCOTT IN ALLERGY LAB  Orders Added: 1)  Xolair (omalizumab) 150mg  [J2357] 2)  Administration xolair injection [[id number]]   Medication Administration  Injection # 1:    Medication: Xolair (omalizumab) 150mg     Diagnosis: EXTRINSIC ASTHMA, UNSPECIFIED (ICD-493.00)    Route: SQ    Site: R deltoid    Exp Date: 07/24/2012    Lot #: [id number]    Mfr: GENENTECH    Comments: 1.2 ML IN RIGHT AND LEFT ARM PT WAITED 30 MINS    Patient tolerated injection without complications    Given by: TAMMY SCOTT IN ALLERGY LAB  Orders Added: 1)  Xolair (omalizumab) 150mg  [J2357] 2)  Administration xolair injection [[id number]]

## 2010-06-23 NOTE — Assessment & Plan Note (Signed)
Summary: 6 months/apc   Primary Provider/Referring Provider:  Alonza Smoker  CC:  6 month follow up visit-breathing is good-walking 1 hour a day 5 days a week.Marland Kitchen  History of Present Illness:  March 26, 2009- Severe asthma/ COPD, Xolair She credits Xolair for keeping her stable and out of the hospital, she has been very compliant with no associated problems. Uses rescue inhaler only rarely now- when wind and leaves. With Xolair she notes much less mucus. We discussed doses and expectations over time.  July 25, 2009- Severe athma/ COPD FEV1 47%, Xolair. Came for routine Xolair inj. She had checked own BP today at home- 141/101. She asked it be checked when she arrived- 140/86. They sent her around for me to see. Cites recent stress.  Staying on Advair 500/50. No prednisone needed in over a year. Feels well, active  and walking regularly 15-30 minutes per day. Denies cough, chest pain, swelling feet.   She has preferred Ventolin HFA over other rescue albuterol. Has used it a couple of times a week recently due to Spring pollen season.   January 23, 2010- Severe asthma/ COPD, FEV1 47%/ Xoliar Has felt very well. Aware of a little throat congestion she blames on season change. Xolair continues to help. She has been walking an hour, 5 days/ week for the last 6 weeks at an airconditioned level walking place at her church. Uses Ventolin less now- months without need but doing it 1-2 x/ day in last week. Very sensitive to the colognes at church. No prednisone in the past year. Doesn't remember A1AT. CXR 2009 showed COPD with prominent hilae.   Asthma History    Initial Asthma Severity Rating:    Age range: 12+ years    Symptoms: 0-2 days/week    Nighttime Awakenings: 0-2/month    Interferes w/ normal activity: minor limitations    SABA use (not for EIB): 0-2 days/week    Asthma Severity Assessment: Mild Persistent   Preventive Screening-Counseling & Management  Alcohol-Tobacco     Smoking  Status: quit     Packs/Day: 0.75     Year Quit: 1979     Pack years: 8     Tobacco Counseling: not indicated; no tobacco use  Current Medications (verified): 1)  Xolair 150 Mg  Solr (Omalizumab) .... Each Arm Every 2 Wks 2)  Zyrtec Allergy 10 Mg  Tabs (Cetirizine Hcl) .... Once Daily 3)  Ventolin Hfa 108 (90 Base) Mcg/act  Aers (Albuterol Sulfate) .... 2 Puffs Every 4 Hours As Needed 4)  Fish Oil 1000 Mg  Caps (Omega-3 Fatty Acids) .... 2 Tabs Once Daily 5)  Multivitamins   Tabs (Multiple Vitamin) .... Take 1 Tablet By Mouth Once A Day 6)  Calcium 500 + D 500-125 Mg-Unit  Tabs (Calcium Carbonate-Vitamin D) .... Take 1 Tablet By Mouth Once A Day 7)  Advair Diskus 250-50 Mcg/dose Aepb (Fluticasone-Salmeterol) .Marland Kitchen.. 1 Puff and Rinse, Twice Daily  Allergies (verified): 1)  ! Biaxin 2)  Aspirin (Aspirin) 3)  Codeine Phosphate (Codeine Phosphate)  Past History:  Past Medical History: Last updated: 08/04/2007  OSTEOPENIA (ICD-733.90) COPD (ICD-496) ASTHMA (ICD-493.90) HEALTH SCREENING (ICD-V70.0) Asprin Urticaria  Past Surgical History: Last updated: 08/04/2007 thumb bunions hysterectopmy  Family History: Last updated: Oct 20, 2008  father died at age 42.  COPD in smoker mother died at age 20, cancer 3 brothers in good health 6 sisters, one died of kidney cancer, and smoking, one died of smoking, and lung cancer.  One  died of ovarian cancer.  The others had a liver transplant from alcoholism and the other two sisters are in good health sister- had melanoma  Social History: Last updated: 01/23/2010 Retired Single Former Smoker Alcohol use-no Drug use-no Regular exercise-no Raised in orphanage after mother died when she was 9  Risk Factors: Exercise: no (03/31/2007)  Risk Factors: Smoking Status: quit (01/23/2010) Packs/Day: 0.75 (01/23/2010)  Social History: Retired Single Former Smoker Alcohol use-no Drug use-no Regular exercise-no Raised in orphanage after  mother died when she was 9 Packs/Day:  0.75  Review of Systems      See HPI       The patient complains of shortness of breath with activity.  The patient denies shortness of breath at rest, productive cough, non-productive cough, coughing up blood, chest pain, irregular heartbeats, acid heartburn, indigestion, loss of appetite, weight change, abdominal pain, difficulty swallowing, sore throat, tooth/dental problems, headaches, nasal congestion/difficulty breathing through nose, and sneezing.    Vital Signs:  Patient profile:   70 year old female Menstrual status:  hysterectomy Height:      68.5 inches Weight:      220.13 pounds BMI:     33.10 O2 Sat:      92 % on Room air Pulse rate:   72 / minute BP sitting:   120 / 76  (right arm) Cuff size:   regular  Vitals Entered By: Reynaldo Minium CMA (January 23, 2010 9:55 AM)  O2 Flow:  Room air CC: 6 month follow up visit-breathing is good-walking 1 hour a day 5 days a week.   Physical Exam  Additional Exam:  General: A/Ox3; pleasant and cooperative, NAD, SKIN: no rash, lesions NODES: no lymphadenopathy HEENT: [place]/AT, EOM- WNL, Conjuctivae- clear, PERRLA, TM-WNL, Nose- clear, Throat- clear and wnl , no thrush NECK: Supple w/ fair ROM, JVD- none, normal carotid impulses w/o bruits Thyroid-  CHEST: clear. No wheeze at all. HEART: RRR, no m/g/r heard ABDOMEN: soft ZOX:WRUE, nl pulses, no edema  NEURO: Grossly intact to observation      Impression & Recommendations:  Problem # 1:  COPD (ICD-496) She has mostly chronic obstructive asthma. Her lung disease is worse than I might expect from reported smoking hx.  we will give flu vax today, update CXR. Will check A1AT kit.  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 We have reviewed expectations, and ways to decide if and when to stop Xolair.  Other Orders: Est. Patient Level IV (45409) T-2 View CXR (71020TC) Admin 1st Vaccine  (81191) Flu Vaccine 70yrs + (47829)  Patient Instructions: 1)  Flu vax 2)  A1AT kit 3)  A chest x-ray has been recommended.  Your imaging study may require preauthorization.  4)  Schedule return in 6 months- earlier as needed           Flu Vaccine Consent Questions     Do you have a history of severe allergic reactions to this vaccine? no    Any prior history of allergic reactions to egg and/or gelatin? no    Do you have a sensitivity to the preservative Thimersol? no    Do you have a past history of Guillan-Barre Syndrome? no    Do you currently have an acute febrile illness? no    Have you ever had a severe reaction to latex? no    Vaccine information given and explained to patient? yes    Are you currently pregnant? no  Lot Number:[id number]   Exp Date:11/21/2010   Site Given  Left Deltoid IMflu   Mindy Silva  January 23, 2010 11:25 AM

## 2010-06-23 NOTE — Miscellaneous (Signed)
Summary: Injection Record / [facility]    Injection Record / [facility]    Imported By: Lennie Odor 10/13/2009 15:00:48  _____________________________________________________________________  External Attachment:    Type:   Image     Comment:   External Document

## 2010-06-23 NOTE — Assessment & Plan Note (Signed)
Summary: xolair/apc   Nurse Visit   Allergies: 1)  ! 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 Xolair 300mg . 1.49ml x 1 in Right and Left Deltoid. Pt waited 20 minutes.     Patient tolerated injection without complications  Orders Added: 1)  Admin of Therapeutic Inj  intramuscular or subcutaneous [96372] 2)  Xolair (omalizumab) 150mg  [J2357]

## 2010-06-23 NOTE — Miscellaneous (Signed)
Summary: Injection Record / [facility]    Injection Record / [facility]    Imported By: Lennie Odor 10/13/2009 14:14:56  _____________________________________________________________________  External Attachment:    Type:   Image     Comment:   External Document

## 2010-06-23 NOTE — Assessment & Plan Note (Signed)
Summary: ? re: high blood press. am/apc   Visit Type:  acute visit Primary Provider/Referring Provider:  Alonza Smoker  CC:  concerned about BP...stressed.  History of Present Illness:  03/29/08- Credits Xolair for staying so very well. Rarely needs more than 1-2 puffs rescue inhler daily if that. Still uses Advair daily. Tolerated colonoscopy without respiratory problem.  .09/24/08- Severe asthma/ copd, Xolair Staying in helped get through the Spring pollen. She doesn't feel as clear on Advair 250 and would like to go back on 500. Continues Xolair "a miracle".Denies any more than occasional cough. Last FEV1 47% predicted.Noticed more local xolair injection site soreness during peak pollen seaseon.  March 26, 2009- Severe asthma/ COPD, Xolair She credits Xolair for keeping her stable and out of the hospital, she has been very compliant with no associated problems. Uses rescue inhaler only rarely now- when wind and leaves. With Xolair she notes much less mucus. We discussed doses and expectations over time.  July 25, 2009- Severe athma/ COPD FEV1 47%, Xolair. Came for routine Xolair inj. She had checked own BP today at home- 141/101. She asked it be checked when she arrived- 140/86. They sent her around for me to see. Cites recent stress.  Staying on Advair 500/50. No prednisone needed in over a year. Feels well, active  and walking regularly 15-30 minutes per day. Denies cough, chest pain, swelling feet.   She has preferred Ventolin HFA over other rescue albuterol. Has used it a couple of times a week recently due to Spring pollen season.   Current Medications (verified): 1)  Albuterol Sulfate (2.5 Mg/38ml) 0.083%  Nebu (Albuterol Sulfate) .... 4 X A Day 2)  Prednisone 10 Mg  Tabs (Prednisone) .... As Needed 3)  Xolair 150 Mg  Solr (Omalizumab) .... Each Arm Every 2 Wks 4)  Zyrtec Allergy 10 Mg  Tabs (Cetirizine Hcl) .... Once Daily 5)  Ventolin Hfa 108 (90 Base) Mcg/act  Aers (Albuterol  Sulfate) .... 2 Puffs Every 4 Hours As Needed 6)  Fish Oil 1000 Mg  Caps (Omega-3 Fatty Acids) .... 2 Tabs Once Daily 7)  Multivitamins   Tabs (Multiple Vitamin) .... Take 1 Tablet By Mouth Once A Day 8)  Calcium 500 + D 500-125 Mg-Unit  Tabs (Calcium Carbonate-Vitamin D) .... Take 1 Tablet By Mouth Once A Day 9)  Advair Diskus 500-50 Mcg/dose Misc (Fluticasone-Salmeterol) .Marland Kitchen.. 1 Puff and Rinse Twice Daily  Allergies (verified): 1)  ! Biaxin 2)  Aspirin (Aspirin) 3)  Codeine Phosphate (Codeine Phosphate)  Past History:  Past Medical History: Last updated: 08/04/2007  OSTEOPENIA (ICD-733.90) COPD (ICD-496) ASTHMA (ICD-493.90) HEALTH SCREENING (ICD-V70.0) Asprin Urticaria  Past Surgical History: Last updated: 08/04/2007 thumb bunions hysterectopmy  Family History: Last updated: 10/08/2008  father died at age 61.  COPD in smoker mother died at age 34, cancer 3 brothers in good health 6 sisters, one died of kidney cancer, and smoking, one died of smoking, and lung cancer.  One died of ovarian cancer.  The others had a liver transplant from alcoholism and the other two sisters are in good health sister- had melanoma  Social History: Last updated: 03/31/2007 Retired Single Former Smoker Alcohol use-no Drug use-no Regular exercise-no  Risk Factors: Exercise: no (03/31/2007)  Risk Factors: Smoking Status: quit (03/31/2007)  Review of Systems      See HPI       The patient complains of dyspnea on exertion.  The patient denies anorexia, fever, weight loss, weight gain, vision  loss, decreased hearing, hoarseness, chest pain, syncope, peripheral edema, prolonged cough, headaches, hemoptysis, abdominal pain, and severe indigestion/heartburn.    Vital Signs:  Patient profile:   70 year old female Height:      69 inches Weight:      217.50 pounds BMI:     32.24 O2 Sat:      93 % on Room air Pulse rate:   72 / minute BP sitting:   140 / 78  (left arm) Cuff size:    regular  Vitals Entered By: Clarise Cruz Duncan Dull) (July 25, 2009 9:57 AM)  O2 Flow:  Room air  Physical Exam  Additional Exam:  General: A/Ox3; pleasant and cooperative, NAD, SKIN: no rash, lesions NODES: no lymphadenopathy HEENT: [place]/AT, EOM- WNL, Conjuctivae- clear, PERRLA, TM-WNL, Nose- clear, Throat- clear and wnl , no thrush NECK: Supple w/ fair ROM, JVD- none, normal carotid impulses w/o bruits Thyroid-  CHEST: clear. No wheeze at all. HEART: RRR, no m/g/r heard ABDOMEN: soft ZOX:WRUE, nl pulses, no edema  NEURO: Grossly intact to observation      Impression & Recommendations:  Problem # 1:  COPD (ICD-496) Wonderful control. We will try reducing Advair to 250/50. Xolair has done well. We also asked her to try sample Proair rescue inhaler before she asks for prior auth for Ventolin not preferred by her insurance.  Problem # 2:  ? of ESSENTIAL HYPERTENSION, BENIGN (ICD-401.1)  Reducing the steroid absorbtion from Advair may help. i have asked her to make f/u appointment with Dr Tawanna Cooler.  Medications Added to Medication List This Visit: 1)  Advair Diskus 250-50 Mcg/dose Aepb (Fluticasone-salmeterol) .Marland Kitchen.. 1 puff and rinse, twice daily  Other Orders: Est. Patient Level III (45409) Admin of Therapeutic Inj  intramuscular or subcutaneous (81191) Xolair (omalizumab) 150mg  (Y7829)  Patient Instructions: 1)  Change return to 6 months unless needed earlier. 2)  Try switching Advair to 250/50- 1 puff and rinse twice daily. 3)  Script sent. Sample. 4)  Try sample Proair rescue inhaler as an alternative to Ventolin. If it is just as good, then it will be cheaper for her. 5)  Please make appointment to see Dr Tawanna Cooler about your BP Prescriptions: ADVAIR DISKUS 250-50 MCG/DOSE AEPB (FLUTICASONE-SALMETEROL) 1 puff and rinse, twice daily  #1 x prn   Entered and Authorized by:   Waymon Budge MD   Signed by:   Waymon Budge MD on 07/25/2009   Method used:   Electronically to         [id number] (retail)       [address]       [place], [place]  [address]       Ph: [phone] or [phone]       Fax: [phone]   RxID:   [id number]    Medication Administration  Injection # 1:    Medication: Xolair (omalizumab) 150mg     Diagnosis: EXTRINSIC ASTHMA, UNSPECIFIED (ICD-493.00)    Route: SQ    Site: R deltoid    Exp Date: 09/2012    Lot #: [id number]    Mfr: Mendel Ryder    Comments: Injection given by Dimas Millin in allergy lab. Xolair 300mg . 1.41ml x 1 in Right and Left Deltoid. Pt saw Md after injection due to HBP.Reynaldo Minium CMA  July 28, 2009 12:22 PM   Orders Added: 1)  Est. Patient Level III [95188] 2)  Admin of Therapeutic Inj  intramuscular or subcutaneous [96372] 3)  Xolair (omalizumab) 150mg  [  J2357]

## 2010-06-23 NOTE — Assessment & Plan Note (Signed)
Summary: Laura Mendez ///KP   Nurse Visit   Allergies: 1)  ! Biaxin 2)  Aspirin (Aspirin) 3)  Codeine Phosphate (Codeine Phosphate)  Medication Administration  Injection # 1:    Medication: Xolair (omalizumab) 150mg     Diagnosis: EXTRINSIC ASTHMA, UNSPECIFIED (ICD-493.00)    Route: IM    Site: R deltoid    Exp Date: 02/21/2013    Lot #: [id number]    Mfr: Genetech    Comments: xolair 300 mg, 60 units, 1.2 ml in Right deltoid, 1.2 ml in Left deltoid. pt didn't wait.    Given by: Drucie Opitz, CMA (AAMA)  Orders Added: 1)  Xolair (omalizumab) 150mg  [J2357] 2)  Administration xolair injection [phone]

## 2010-06-23 NOTE — Assessment & Plan Note (Signed)
Summary: xolair/ mbw   Nurse Visit   Allergies: 1)  ! Biaxin 2)  Aspirin (Aspirin) 3)  Codeine Phosphate (Codeine Phosphate)  Medication Administration  Injection # 1:    Medication: Xolair (omalizumab) 150mg     Diagnosis: EXTRINSIC ASTHMA, UNSPECIFIED (ICD-493.00)    Route: IM    Site: R deltoid    Exp Date: 02/21/2013    Lot #: [id number]    Mfr: Genetech    Comments: xolair 1.2 ml in Right deltoid and 1.2 ml in L Deltoid. Pt waited 20 minutes    Given by: Drucie Opitz, CMA  Orders Added: 1)  Xolair (omalizumab) 150mg  [J2357] 2)  Administration xolair injection [phone]

## 2010-06-23 NOTE — Assessment & Plan Note (Signed)
Summary: xolair/apc   Nurse Visit   Allergies: 1)  ! Biaxin 2)  Aspirin (Aspirin) 3)  Codeine Phosphate (Codeine Phosphate)  Medication Administration  Injection # 1:    Medication: Xolair (omalizumab) 150mg     Diagnosis: EXTRINSIC ASTHMA, UNSPECIFIED (ICD-493.00)    Route: IM    Site: R deltoid    Exp Date: 02/21/2013    Lot #: [id number]    Mfr: Salome Spotted    Comments: xolair 300 mg 60 units, 1.2 ml x 1 in Right deltoid, 1.2 ml x1 in Left deltoid Patient waited 20 mins.    Given by: Drucie Opitz, CMA (AAMA)  Orders Added: 1)  Xolair (omalizumab) 150mg  [J2357] 2)  Administration xolair injection [phone]

## 2010-06-23 NOTE — Assessment & Plan Note (Signed)
Summary: xolair 2 wks ///kp   Nurse Visit   Allergies: 1)  ! Biaxin 2)  Aspirin (Aspirin) 3)  Codeine Phosphate (Codeine Phosphate)  Medication Administration  Injection # 1:    Medication: Xolair (omalizumab) 150mg     Diagnosis: EXTRINSIC ASTHMA, UNSPECIFIED (ICD-493.00)    Route: IM    Site: R deltoid    Exp Date: 02/21/2013    Lot #: [id number]    Mfr: Genetech    Comments: xolair 300, units 60 1.2 ml in Right deltoid and 1.2 ml in Left deltoid. Pt. waited 20 mins.    Given by: Drucie Opitz, CMA (AAMA)  Orders Added: 1)  Xolair (omalizumab) 150mg  [J2357] 2)  Administration xolair injection [phone]

## 2010-06-23 NOTE — Assessment & Plan Note (Signed)
Summary: xolair/apc   Nurse Visit   Allergies: 1)  ! Biaxin 2)  Aspirin (Aspirin) 3)  Codeine Phosphate (Codeine Phosphate)  Medication Administration  Injection # 1:    Medication: Xolair (omalizumab) 150mg     Diagnosis: 493.00    Route: SQ    Site: L deltoid    Exp Date: 11/21/2012    Lot #: [id number]    Mfr: Salome Spotted    Comments: 1.2 ml in left and right arm pt waited  charged [id number] and [id number]    Patient tolerated injection without complications    Given by: tammy scott in allergy lab   Medication Administration  Injection # 1:    Medication: Xolair (omalizumab) 150mg     Diagnosis: 493.00    Route: SQ    Site: L deltoid    Exp Date: 11/21/2012    Lot #: [id number]    Mfr: Salome Spotted    Comments: 1.2 ml in left and right arm pt waited  charged j2357 and 47829    Patient tolerated injection without complications    Given by: tammy scott in allergy lab

## 2010-06-23 NOTE — Progress Notes (Signed)
Summary: Rescue Inahler  Phone Note Call from Patient   Caller: Letta Kocher Call For: Dr. Maple Hudson Summary of Call: Good Morning Dr. Maple Hudson, Elea Holtzclaw is in the allergy office getting her Xolair shots and said that you gave her a sample of ProAir at her last visit and she says that it is not doing any good. She would like to know if you can give her something else that might work while she is in the allergy clinic before she leaves. Please advise. Initial call taken by: Clarise Cruz Duncan Dull),  August 22, 2009 9:12 AM  Follow-up for Phone Call        Spoke with [facility]- per Dr. Maple Hudson, there isnt another rescue inhaler to give that would help; pt should continue on same meds as given and if any other issues make an appt to see and discuss with Dr. Reynaldo Minium CMA  August 22, 2009 9:17 AM  Drucie Opitz, CMA in allergy informed pt of this.

## 2010-06-23 NOTE — Assessment & Plan Note (Signed)
Summary: xolair/apc   Nurse Visit   Allergies: 1)  ! Biaxin 2)  Aspirin (Aspirin) 3)  Codeine Phosphate (Codeine Phosphate)  Medication Administration  Injection # 1:    Medication: Xolair (omalizumab) 150mg     Diagnosis: EXTRINSIC ASTHMA, UNSPECIFIED (ICD-493.00)    Route: SQ    Site: L deltoid    Exp Date: 07/2012    Lot #: [id number]    Mfr: Mendel Ryder    Comments: Injection given by Dimas Millin in allergy lab. Xolair 300mg . 1.86ml x 1 in Left and Right Deltoid. Pt waited 30 minutes.     Patient tolerated injection without complications  Orders Added: 1)  Admin of Therapeutic Inj  intramuscular or subcutaneous [96372] 2)  Xolair (omalizumab) 150mg  [J2357]

## 2010-06-23 NOTE — Assessment & Plan Note (Signed)
Summary: xolair/apc   Nurse Visit   Allergies: 1)  ! Biaxin 2)  Aspirin (Aspirin) 3)  Codeine Phosphate (Codeine Phosphate)  Medication Administration  Injection # 1:    Medication: Xolair (omalizumab) 150mg     Diagnosis: EXTRINSIC ASTHMA, UNSPECIFIED (ICD-493.00)    Route: SQ    Site: R deltoid    Exp Date: 09/21/2012    Lot #: [id number]    Mfr: GENENTECH    Comments: 1.2 ML X RIGHT ARM AND 1.2ML X LEFT ARM PT WAITED 20 MINS    Patient tolerated injection without complications    Given by: SUSANNE FORD IN ALLERGY LAB  Orders Added: 1)  Xolair (omalizumab) 150mg  [J2357] 2)  Administration xolair injection [id number]   Medication Administration  Injection # 1:    Medication: Xolair (omalizumab) 150mg     Diagnosis: EXTRINSIC ASTHMA, UNSPECIFIED (ICD-493.00)    Route: SQ    Site: R deltoid    Exp Date: 09/21/2012    Lot #: [id number]    Mfr: GENENTECH    Comments: 1.2 ML X RIGHT ARM AND 1.2ML X LEFT ARM PT WAITED 20 MINS    Patient tolerated injection without complications    Given by: SUSANNE FORD IN ALLERGY LAB  Orders Added: 1)  Xolair (omalizumab) 150mg  [J2357] 2)  Administration xolair injection [81191]

## 2010-06-23 NOTE — Progress Notes (Signed)
Summary: xolair  Phone Note Call from Patient   Caller: Patient Call For: young Summary of Call: pt wants to speak to rhonda re: xolair. [phone] Initial call taken by: Tivis Ringer, CNA,  April 15, 2010 2:29 PM  Follow-up for Phone Call        Pt called and wanted to advise me that she had called [employer] and that she couldn't renew her application untill 05/25/10. Noted. Alfonso Ramus  April 15, 2010 3:14 PM

## 2010-06-23 NOTE — Assessment & Plan Note (Signed)
Summary: xolair///kp   Nurse Visit   Allergies: 1)  ! Biaxin 2)  Aspirin (Aspirin) 3)  Codeine Phosphate (Codeine Phosphate)  Medication Administration  Injection # 1:    Medication: Xolair (omalizumab) 150mg     Diagnosis: EXTRINSIC ASTHMA, UNSPECIFIED (ICD-493.00)    Route: IM    Site: L deltoid    Exp Date: 12/22/2012    Lot #: [id number]    Mfr: Genetech    Comments: xolair 300 mg, 60 units. 1.2 ml x 1 in left deltoid + 1.2 ml x 1 in right deltoid. pt waited 20 minutes.    Given by: Clarise Cruz [facility])   Orders Added: 1)  Administration xolair injection [phone] 2)  Xolair (omalizumab) 150mg  [J2357]   Medication Administration  Injection # 1:    Medication: Xolair (omalizumab) 150mg     Diagnosis: EXTRINSIC ASTHMA, UNSPECIFIED (ICD-493.00)    Route: IM    Site: L deltoid    Exp Date: 12/22/2012    Lot #: [id number]    Mfr: Genetech    Comments: xolair 300 mg, 60 units. 1.2 ml x 1 in left deltoid + 1.2 ml x 1 in right deltoid. pt waited 20 minutes.    Given by: Clarise Cruz [facility])   Orders Added: 1)  Administration xolair injection [phone] 2)  Xolair (omalizumab) 150mg  [J2357]

## 2010-06-23 NOTE — Assessment & Plan Note (Signed)
Summary: Laura Mendez   Nurse Visit   Allergies: 1)  ! Biaxin 2)  Aspirin (Aspirin) 3)  Codeine Phosphate (Codeine Phosphate)  Medication Administration  Injection # 1:    Medication: Xolair (omalizumab) 150mg     Diagnosis: EXTRINSIC ASTHMA, UNSPECIFIED (ICD-493.00)    Route: SQ    Site: L deltoid    Exp Date: 09/21/2012    Lot #: [id number]    Mfr: GENENTECH    Comments: 1.2 ML IN LEFT AND RIGHT ARM ALSO CHARGED FOR 91478    Patient tolerated injection without complications    Given by: TAMMY SCOTT IN ALLERGY LAB   Medication Administration  Injection # 1:    Medication: Xolair (omalizumab) 150mg     Diagnosis: EXTRINSIC ASTHMA, UNSPECIFIED (ICD-493.00)    Route: SQ    Site: L deltoid    Exp Date: 09/21/2012    Lot #: [id number]    Mfr: GENENTECH    Comments: 1.2 ML IN LEFT AND RIGHT ARM ALSO CHARGED FOR 30865    Patient tolerated injection without complications    Given by: TAMMY SCOTT IN ALLERGY LAB

## 2010-06-23 NOTE — Assessment & Plan Note (Signed)
Summary: xolair/mhh   Nurse Visit   Allergies: 1)  ! Biaxin 2)  Aspirin (Aspirin) 3)  Codeine Phosphate (Codeine Phosphate)  Medication Administration  Injection # 1:    Medication: Xolair (omalizumab) 150mg     Diagnosis: EXTRINSIC ASTHMA, UNSPECIFIED (ICD-493.00)    Route: SQ    Site: R deltoid    Exp Date: 02/2013    Lot #: [id number]    Mfr: Genetech    Comments: 1.2 ML IN RIGHT AND LEFT ARM PT WAITED 300MG     Patient tolerated injection without complications    Given by: TAMMY SCOTT IN ALLERGY LAB  Orders Added: 1)  Xolair (omalizumab) 150mg  [J2357] 2)  Administration xolair injection [04540]   Medication Administration  Injection # 1:    Medication: Xolair (omalizumab) 150mg     Diagnosis: EXTRINSIC ASTHMA, UNSPECIFIED (ICD-493.00)    Route: SQ    Site: R deltoid    Exp Date: 02/2013    Lot #: [id number]    Mfr: Genetech    Comments: 1.2 ML IN RIGHT AND LEFT ARM PT WAITED 300MG     Patient tolerated injection without complications    Given by: TAMMY SCOTT IN ALLERGY LAB  Orders Added: 1)  Xolair (omalizumab) 150mg  [J2357] 2)  Administration xolair injection [47829]

## 2010-06-23 NOTE — Assessment & Plan Note (Signed)
Summary: xolair///kp   Nurse Visit   Allergies: 1)  ! Biaxin 2)  Aspirin (Aspirin) 3)  Codeine Phosphate (Codeine Phosphate)  Medication Administration  Injection # 1:    Medication: Xolair (omalizumab) 150mg     Diagnosis: EXTRINSIC ASTHMA, UNSPECIFIED (ICD-493.00)    Route: SQ    Site: L deltoid    Exp Date: 02/2013    Lot #: [id number]    Mfr: Genetech    Comments: 1.2 ML IN LEFT AND RIGHT ARM 300MG  PT WAITED 30 MINS CHARGED J2357 AND 04540    Patient tolerated injection without complications    Given by: TAMMY SCOTT IN ALLERGY LAB  Orders Added: 1)  Xolair (omalizumab) 150mg  [J2357] 2)  Administration xolair injection [98119]   Medication Administration  Injection # 1:    Medication: Xolair (omalizumab) 150mg     Diagnosis: EXTRINSIC ASTHMA, UNSPECIFIED (ICD-493.00)    Route: SQ    Site: L deltoid    Exp Date: 02/2013    Lot #: [id number]    Mfr: Genetech    Comments: 1.2 ML IN LEFT AND RIGHT ARM 300MG  PT WAITED 30 MINS CHARGED J2357 AND 56213    Patient tolerated injection without complications    Given by: TAMMY SCOTT IN ALLERGY LAB  Orders Added: 1)  Xolair (omalizumab) 150mg  [J2357] 2)  Administration xolair injection [08657]

## 2010-06-23 NOTE — Assessment & Plan Note (Signed)
Summary: xolair/apc   Nurse Visit   Allergies: 1)  ! Biaxin 2)  Aspirin (Aspirin) 3)  Codeine Phosphate (Codeine Phosphate)  Medication Administration  Injection # 1:    Medication: Xolair (omalizumab) 150mg     Diagnosis: EXTRINSIC ASTHMA, UNSPECIFIED (ICD-493.00)    Route: IM    Site: R deltoid    Exp Date: 12/22/2012    Lot #: [id number]    Comments: xolair 300, 60 units, 1.2 ml Right deltoid, 1.2 ml Left deltoid. Pt waited 30 mins.    Given by: Carver Fila, CMA  Orders Added: 1)  Xolair (omalizumab) 150mg  [J2357] 2)  Administration xolair injection [69629]   Medication Administration  Injection # 1:    Medication: Xolair (omalizumab) 150mg     Diagnosis: EXTRINSIC ASTHMA, UNSPECIFIED (ICD-493.00)    Route: IM    Site: R deltoid    Exp Date: 12/22/2012    Lot #: [id number]    Comments: xolair 300, 60 units, 1.2 ml Right deltoid, 1.2 ml Left deltoid. Pt waited 30 mins.    Given by: Carver Fila, CMA  Orders Added: 1)  Xolair (omalizumab) 150mg  [J2357] 2)  Administration xolair injection [phone]

## 2010-06-23 NOTE — Assessment & Plan Note (Signed)
Summary: cpx//ccm   Vital Signs:  Patient profile:   70 year old female Menstrual status:  hysterectomy Height:      68.5 inches Weight:      211 pounds Temp:     98.2 degrees F oral BP sitting:   120 / 84  (left arm) Cuff size:   regular  Vitals Entered By: Kern Reap CMA Duncan Dull) (Sep 25, 2009 9:14 AM) CC: cpx      Menstrual Status hysterectomy   Primary Care Provider:  Alonza Smoker  CC:  cpx .  History of Present Illness: Laura Mendez is a 70 year old single female, nonsmoker, who comes in today for a general physical examination because of a history of underlying allergic rhinitis and COPD and asthma.  She takes Xolair 150 mg every two weeks.  Zyrtec 10 mg nightly, Advair 250 -- 51-cup b.i.d. and Ventolin p.r.n.  She sees Dr. Fannie Knee twice a year.  Her asthma has been stable.  No flares this year.  We did a bone density.  Last year.  She's been on high-dose steroids  off-and-on for years.  She was also on Fosamax for about 10 years.  Bone density was normal.  Despite, that she's lost height........ 5-foot 8.  Her past medical history, social history, family history in detail.  No significant changes.  She walks 30 minutes twice or 3 times weekly.  Advised to walk daily.  Her mood is good.  No depression.  Hearing normal.  ADLs.  Normal.  Fall risk reviewed.  Negligible.  Counts safety reviewed normal weight in vision.  Normal height, as noted above.  She was counseled to walk 30 minutes daily.  Labs reviewed.  Because of her history of COPD EKG was done and showed no change. tetanus 2006, seasonal flu 2010, Pneumovax, x 2.  She sees Dr. 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  Advise.  She does not need Pap smears anymore.  Allergies: 1)  ! Biaxin 2)  Aspirin (Aspirin) 3)  Codeine Phosphate (Codeine Phosphate)  Past History:  Past medical, surgical, family and social histories (including risk  factors) reviewed, and no changes noted (except as noted below).  Past Medical History: Reviewed history from 08/04/2007 and no changes required.  OSTEOPENIA (ICD-733.90) COPD (ICD-496) ASTHMA (ICD-493.90) HEALTH SCREENING (ICD-V70.0) Asprin Urticaria  Past Surgical History: Reviewed history from 08/04/2007 and no changes required. thumb bunions hysterectopmy  Family History: Reviewed history from 09/25/2008 and no changes required.  father died at age 42.  COPD in smoker mother died at age 25, cancer 3 brothers in good health 6 sisters, one died of kidney cancer, and smoking, one died of smoking, and lung cancer.  One died of ovarian cancer.  The others had a liver transplant from alcoholism and the other two sisters are in good health sister- had melanoma  Social History: Reviewed history from 03/31/2007 and no changes required. Retired Single Former Smoker Alcohol use-no Drug use-no Regular exercise-no  Review of Systems      See HPI  Physical Exam  General:  Well-developed,well-nourished,in no acute distress; alert,appropriate and cooperative throughout examination Head:  Normocephalic and atraumatic without obvious abnormalities. No apparent alopecia or balding. Eyes:  No corneal or conjunctival inflammation noted. EOMI. Perrla. Funduscopic exam benign, without hemorrhages, exudates or papilledema. Vision grossly normal. Ears:  External ear exam shows no significant lesions or deformities.  Otoscopic examination reveals clear canals, tympanic membranes are  intact bilaterally without bulging, retraction, inflammation or discharge. Hearing is grossly normal bilaterally. Nose:  External nasal examination shows no deformity or inflammation. Nasal mucosa are pink and moist without lesions or exudates. Mouth:  Oral mucosa and oropharynx without lesions or exudates.  Teeth in good repair. Neck:  No deformities, masses, or tenderness noted. Chest Wall:  No deformities,  masses, or tenderness noted. Breasts:  No mass, nodules, thickening, tenderness, bulging, retraction, inflamation, nipple discharge or skin changes noted.   Lungs:  symmetrical decrease in breath sounds.  No wheezing Heart:  Normal rate and regular rhythm. S1 and S2 normal without gallop, murmur, click, rub or other extra sounds. Abdomen:  Bowel sounds positive,abdomen soft and non-tender without masses, organomegaly or hernias noted. Msk:  No deformity or scoliosis noted of thoracic or lumbar spine.   Pulses:  R and L carotid,radial,femoral,dorsalis pedis and posterior tibial pulses are full and equal bilaterally Extremities:  No clubbing, cyanosis, edema, or deformity noted with normal full range of motion of all joints.   Neurologic:  No cranial nerve deficits noted. Station and gait are normal. Plantar reflexes are down-going bilaterally. DTRs are symmetrical throughout. Sensory, motor and coordinative functions appear intact. Skin:  Intact without suspicious lesions or rashes Cervical Nodes:  No lymphadenopathy noted Axillary Nodes:  No palpable lymphadenopathy Inguinal Nodes:  No significant adenopathy Psych:  Cognition and judgment appear intact. Alert and cooperative with normal attention span and concentration. No apparent delusions, illusions, hallucinations   Impression & Recommendations:  Problem # 1:  COPD (ICD-496) Assessment Unchanged  The following medications were removed from the medication list:    Albuterol Sulfate (2.5 Mg/68ml) 0.083% Nebu (Albuterol sulfate) .Marland KitchenMarland KitchenMarland KitchenMarland Kitchen 4 x a day Her updated medication list for this problem includes:    Xolair 150 Mg Solr (Omalizumab) ..... Each arm every 2 wks    Ventolin Hfa 108 (90 Base) Mcg/act Aers (Albuterol sulfate) .Marland Kitchen... 2 puffs every 4 hours as needed    Advair Diskus 250-50 Mcg/dose Aepb (Fluticasone-salmeterol) .Marland Kitchen... 1 puff and rinse, twice daily  Orders: Prescription Created Electronically [phone])  Problem # 2:  EXTRINSIC  ASTHMA, UNSPECIFIED (ICD-493.00) Assessment: Improved  The following medications were removed from the medication list:    Albuterol Sulfate (2.5 Mg/9ml) 0.083% Nebu (Albuterol sulfate) .Marland KitchenMarland KitchenMarland KitchenMarland Kitchen 4 x a day    Prednisone 10 Mg Tabs (Prednisone) .Marland Kitchen... As needed Her updated medication list for this problem includes:    Xolair 150 Mg Solr (Omalizumab) ..... Each arm every 2 wks    Ventolin Hfa 108 (90 Base) Mcg/act Aers (Albuterol sulfate) .Marland Kitchen... 2 puffs every 4 hours as needed    Advair Diskus 250-50 Mcg/dose Aepb (Fluticasone-salmeterol) .Marland Kitchen... 1 puff and rinse, twice daily  Orders: Prescription Created Electronically [phone])  Problem # 3:  HEALTH SCREENING (ICD-V70.0) Assessment: Unchanged  Orders: Prescription Created Electronically [phone]) EKG w/ Interpretation (93000)  Complete Medication List: 1)  Xolair 150 Mg Solr (Omalizumab) .... Each arm every 2 wks 2)  Zyrtec Allergy 10 Mg Tabs (Cetirizine hcl) .... Once daily 3)  Ventolin Hfa 108 (90 Base) Mcg/act Aers (Albuterol sulfate) .... 2 puffs every 4 hours as needed 4)  Fish Oil 1000 Mg Caps (Omega-3 fatty acids) .... 2 tabs once daily 5)  Multivitamins Tabs (Multiple vitamin) .... Take 1 tablet by mouth once a day 6)  Calcium 500 + D 500-125 Mg-unit Tabs (Calcium carbonate-vitamin d) .... Take 1 tablet by mouth once a day 7)  Advair Diskus 250-50 Mcg/dose Aepb (Fluticasone-salmeterol) .Marland Kitchen.. 1 puff  and rinse, twice daily  Patient Instructions: 1)  walk 30 minutes daily. 2)  Continue current medications follow-up in one year. 3)  u  no longer need pelvic exams Prescriptions: ADVAIR DISKUS 250-50 MCG/DOSE AEPB (FLUTICASONE-SALMETEROL) 1 puff and rinse, twice daily  #3 x 4   Entered and Authorized by:   Roderick Pee MD   Signed by:   Roderick Pee MD on 09/25/2009   Method used:   Electronically to        [id number] (retail)       [address]       [place], [place]  [address]       Ph: [phone] or [phone]        Fax: [phone]   RxID:   [id number]      Preventive Care Screening  Colonoscopy:    Date:  03/24/2008    Next Due:  03/2013    Results:  normal   Bone Density:    Date:  04/23/2009    Results:  normal std dev

## 2010-06-23 NOTE — Assessment & Plan Note (Signed)
Summary: xolair/aopc   Nurse Visit   Allergies: 1)  ! Biaxin 2)  Aspirin (Aspirin) 3)  Codeine Phosphate (Codeine Phosphate)  Medication Administration  Injection # 1:    Medication: Xolair (omalizumab) 150mg     Diagnosis: 493.00    Route: SQ    Site: R deltoid    Exp Date: 11/2012    Lot #: [id number]    Mfr: Salome Spotted    Comments: 1.2 ML IN RIGHT AND LEFT ARM PT WAITED 30 MINS    Patient tolerated injection without complications    Given by: TAMMY SCOTT IN ALLERGY LAB   Medication Administration  Injection # 1:    Medication: Xolair (omalizumab) 150mg     Diagnosis: 493.00    Route: SQ    Site: R deltoid    Exp Date: 11/2012    Lot #: [id number]    Mfr: Salome Spotted    Comments: 1.2 ML IN RIGHT AND LEFT ARM PT WAITED 30 MINS    Patient tolerated injection without complications    Given by: TAMMY SCOTT IN ALLERGY LAB

## 2010-06-23 NOTE — Assessment & Plan Note (Signed)
Summary: xolair/apc   Nurse Visit   Allergies: 1)  ! Biaxin 2)  Aspirin (Aspirin) 3)  Codeine Phosphate (Codeine Phosphate)  Medication Administration  Injection # 1:    Medication: Xolair (omalizumab) 150mg     Diagnosis: EXTRINSIC ASTHMA, UNSPECIFIED (ICD-493.00)    Route: SQ    Site: R deltoid    Exp Date: 05/2012    Lot #: [id number]    Mfr: Mendel Ryder    Comments: Injection given by Dimas Millin in allergy lab. Xolair 300mg . 1.14ml x 1 in Right and Left Deltoid. Pt waited 30 minutes.    Patient tolerated injection without complications  Orders Added: 1)  Admin of Therapeutic Inj  intramuscular or subcutaneous [96372] 2)  Xolair (omalizumab) 150mg  [J2357]

## 2010-06-23 NOTE — Assessment & Plan Note (Signed)
Summary: xolair/apc   Nurse Visit   Allergies: 1)  ! Biaxin 2)  Aspirin (Aspirin) 3)  Codeine Phosphate (Codeine Phosphate)  Medication Administration  Injection # 1:    Medication: Xolair (omalizumab) 150mg     Diagnosis: EXTRINSIC ASTHMA, UNSPECIFIED (ICD-493.00)    Route: SQ    Site: R deltoid    Exp Date: 05/2012    Lot #: [id number]    Mfr: Mendel Ryder    Comments: Injection given by Clarise Cruz in allergy lab. Xolair 300mg . 1.97ml x 1 in Right and Left Deltoid. Pt waited 20 minutes.     Patient tolerated injection without complications  Orders Added: 1)  Admin of Therapeutic Inj  intramuscular or subcutaneous [96372] 2)  Xolair (omalizumab) 150mg  [J2357]

## 2010-06-23 NOTE — Assessment & Plan Note (Signed)
Summary: Laura Mendez ///kp   Nurse Visit   Allergies: 1)  ! Biaxin 2)  Aspirin (Aspirin) 3)  Codeine Phosphate (Codeine Phosphate)  Medication Administration  Injection # 1:    Medication: Xolair (omalizumab) 150mg     Diagnosis: EXTRINSIC ASTHMA, UNSPECIFIED (ICD-493.00)    Route: SQ    Site: R deltoid    Exp Date: 09/21/2012    Lot #: [id number]    Mfr: Salome Spotted    Comments: 1.2 ML IN RIGHT AND LEFT ARM PT WAITED 30 MINS CHARGED J2357 X 60 AND 21308 X 2    Patient tolerated injection without complications    Given by: TAMMY SCOTT IN ALLERGY LAB   Medication Administration  Injection # 1:    Medication: Xolair (omalizumab) 150mg     Diagnosis: EXTRINSIC ASTHMA, UNSPECIFIED (ICD-493.00)    Route: SQ    Site: R deltoid    Exp Date: 09/21/2012    Lot #: [id number]    Mfr: Salome Spotted    Comments: 1.2 ML IN RIGHT AND LEFT ARM PT WAITED 30 MINS CHARGED J2357 X 60 AND 96295 X 2    Patient tolerated injection without complications    Given by: TAMMY SCOTT IN ALLERGY LAB

## 2010-06-23 NOTE — Assessment & Plan Note (Signed)
Summary: xolair/apc  Pt was seen by doctor; therefore please see 07-25-09 OV for charges for Xolair. Reynaldo Minium CMA  July 28, 2009 12:21 PM        Nurse Visit   Allergies: 1)  ! Biaxin 2)  Aspirin (Aspirin) 3)  Codeine Phosphate (Codeine Phosphate)  Orders Added: 1)  No Charge Patient Arrived (NCPA0) [NCPA0]

## 2010-06-23 NOTE — Assessment & Plan Note (Signed)
Summary: xolair/apc   Nurse Visit   Allergies: 1)  ! Biaxin 2)  Aspirin (Aspirin) 3)  Codeine Phosphate (Codeine Phosphate)  Medication Administration  Injection # 1:    Medication: Xolair (omalizumab) 150mg     Diagnosis: EXTRINSIC ASTHMA, UNSPECIFIED (ICD-493.00)    Route: SQ    Site: R deltoid    Exp Date: 07/2012    Lot #: [id number]    Mfr: Mendel Ryder    Comments: Injection given by Drucie Opitz, CMA in allergy lab. Xolair 300mg . 1.6ml x 1 in Right and Left Deltoid. Pt waited 30 minutes.     Patient tolerated injection without complications  Orders Added: 1)  Admin of Therapeutic Inj  intramuscular or subcutaneous [96372] 2)  Xolair (omalizumab) 150mg  [J2357]

## 2010-06-25 NOTE — Assessment & Plan Note (Signed)
Summary: Geoffry Paradise ///kp   Nurse Visit   Allergies: 1)  ! Biaxin 2)  Aspirin (Aspirin) 3)  Codeine Phosphate (Codeine Phosphate)  Medication Administration  Injection # 1:    Medication: Xolair (omalizumab) 150mg     Diagnosis: EXTRINSIC ASTHMA, UNSPECIFIED (ICD-493.00)    Route: SQ    Site: R deltoid    Exp Date: 05/2013    Lot #: [id number]    Mfr: [employer]    Comments: 1.2ML IN RIGHT AND LEFT ARM 300MG  CHARGED J2357 X 60 AND  [id number]    Patient tolerated injection without complications    Given by: SUSANNE FORD IN ALLERGY LAB   Medication Administration  Injection # 1:    Medication: Xolair (omalizumab) 150mg     Diagnosis: EXTRINSIC ASTHMA, UNSPECIFIED (ICD-493.00)    Route: SQ    Site: R deltoid    Exp Date: 05/2013    Lot #: [id number]    Mfr: [employer]    Comments: 1.2ML IN RIGHT AND LEFT ARM 300MG  CHARGED J2357 X 60 AND  [id number]    Patient tolerated injection without complications    Given by: SUSANNE FORD IN ALLERGY LAB

## 2010-06-25 NOTE — Assessment & Plan Note (Signed)
Summary: Laura Mendez ///kp   Nurse Visit   Allergies: 1)  ! Biaxin 2)  Aspirin (Aspirin) 3)  Codeine Phosphate (Codeine Phosphate)  Medication Administration  Injection # 1:    Medication: Xolair (omalizumab) 150mg     Diagnosis: EXTRINSIC ASTHMA, UNSPECIFIED (ICD-493.00)    Route: SQ    Site: R deltoid    Exp Date: 05/2013    Lot #: [id number]    Mfr: [employer]    Comments: 1.2 ML IN RIGHT AND LEFT ARM 300MG  CHARGED [id number] AND 69629    Given by: Drucie Opitz IN ALLERGY LAB  Orders Added: 1)  Xolair (omalizumab) 150mg  [J2357] 2)  Administration xolair injection [52841]   Medication Administration  Injection # 1:    Medication: Xolair (omalizumab) 150mg     Diagnosis: EXTRINSIC ASTHMA, UNSPECIFIED (ICD-493.00)    Route: SQ    Site: R deltoid    Exp Date: 05/2013    Lot #: [id number]    Mfr: [employer]    Comments: 1.2 ML IN RIGHT AND LEFT ARM 300MG  CHARGED [id number] AND 02725    Given by: Drucie Opitz IN ALLERGY LAB  Orders Added: 1)  Xolair (omalizumab) 150mg  [J2357] 2)  Administration xolair injection [36644]

## 2010-06-25 NOTE — Assessment & Plan Note (Signed)
Summary: Laura Mendez ///kp   Nurse Visit   Allergies: 1)  ! Biaxin 2)  Aspirin (Aspirin) 3)  Codeine Phosphate (Codeine Phosphate)  Medication Administration  Injection # 1:    Medication: Xolair (omalizumab) 150mg     Diagnosis: EXTRINSIC ASTHMA, UNSPECIFIED (ICD-493.00)    Route: SQ    Site: R deltoid    Exp Date: 05/2013    Lot #: [id number]    Mfr: [employer]    Comments: 1.2 ML IN RIGHT AND LEFT ARM 300MG  CHARGED [id number] AND 65784    Patient tolerated injection without complications    Given by: SUSANNE FORD IN ALLERGY LAB  Orders Added: 1)  Xolair (omalizumab) 150mg  [J2357] 2)  Administration xolair injection [id number]   Medication Administration  Injection # 1:    Medication: Xolair (omalizumab) 150mg     Diagnosis: EXTRINSIC ASTHMA, UNSPECIFIED (ICD-493.00)    Route: SQ    Site: R deltoid    Exp Date: 05/2013    Lot #: [id number]    Mfr: [employer]    Comments: 1.2 ML IN RIGHT AND LEFT ARM 300MG  CHARGED [id number] AND 28413    Patient tolerated injection without complications    Given by: SUSANNE FORD IN ALLERGY LAB  Orders Added: 1)  Xolair (omalizumab) 150mg  [J2357] 2)  Administration xolair injection [24401]

## 2010-07-01 NOTE — Assessment & Plan Note (Signed)
Summary: xolair/mhh   Nurse Visit   Allergies: 1)  ! Biaxin 2)  Aspirin (Aspirin) 3)  Codeine Phosphate (Codeine Phosphate)  Medication Administration  Injection # 1:    Medication: Xolair (omalizumab) 150mg     Diagnosis: EXTRINSIC ASTHMA, UNSPECIFIED (ICD-493.00)    Route: SQ    Site: R deltoid    Exp Date: 05/2013    Lot #: [id number]    Mfr: [employer]    Comments: 1.2 ML IN RIGHT AND LEFT ARM 300 CHARGED [id number] AND 10272    Given by: Drucie Opitz IN ALLERGY LAB  Orders Added: 1)  Xolair (omalizumab) 150mg  [J2357] 2)  Administration xolair injection [53664]   Medication Administration  Injection # 1:    Medication: Xolair (omalizumab) 150mg     Diagnosis: EXTRINSIC ASTHMA, UNSPECIFIED (ICD-493.00)    Route: SQ    Site: R deltoid    Exp Date: 05/2013    Lot #: [id number]    Mfr: [employer]    Comments: 1.2 ML IN RIGHT AND LEFT ARM 300 CHARGED [id number] AND 25956    Given by: Drucie Opitz IN ALLERGY LAB  Orders Added: 1)  Xolair (omalizumab) 150mg  [J2357] 2)  Administration xolair injection [38756]

## 2010-07-03 ENCOUNTER — Encounter: Payer: Self-pay | Admitting: Internal Medicine

## 2010-07-03 ENCOUNTER — Ambulatory Visit (INDEPENDENT_AMBULATORY_CARE_PROVIDER_SITE_OTHER): Payer: MEDICARE

## 2010-07-03 DIAGNOSIS — J45909 Unspecified asthma, uncomplicated: Secondary | ICD-10-CM

## 2010-07-09 NOTE — Assessment & Plan Note (Signed)
Summary: xolair/mhh   Nurse Visit   Allergies: 1)  ! Biaxin 2)  Aspirin (Aspirin) 3)  Codeine Phosphate (Codeine Phosphate)  Medication Administration  Injection # 1:    Medication: Xolair (omalizumab) 150mg     Diagnosis: EXTRINSIC ASTHMA, UNSPECIFIED (ICD-493.00)    Route: SQ    Site: R deltoid    Exp Date: 05/2013    Lot #: [id number]    Mfr: GENENTECH    Comments: 1.2 ML IN RIGHT AND LEFT ARM 300MG  CHARGED [id number] AND  04540    Given by: Drucie Opitz IN ALLERGY LAB  Orders Added: 1)  Xolair (omalizumab) 150mg  [J2357] 2)  Administration xolair injection [98119]   Medication Administration  Injection # 1:    Medication: Xolair (omalizumab) 150mg     Diagnosis: EXTRINSIC ASTHMA, UNSPECIFIED (ICD-493.00)    Route: SQ    Site: R deltoid    Exp Date: 05/2013    Lot #: [id number]    Mfr: GENENTECH    Comments: 1.2 ML IN RIGHT AND LEFT ARM 300MG  CHARGED [id number] AND  56213    Given by: Drucie Opitz IN ALLERGY LAB  Orders Added: 1)  Xolair (omalizumab) 150mg  [J2357] 2)  Administration xolair injection [08657]

## 2010-07-15 NOTE — Miscellaneous (Signed)
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

## 2010-07-17 ENCOUNTER — Encounter: Payer: Self-pay | Admitting: Internal Medicine

## 2010-07-17 ENCOUNTER — Ambulatory Visit (INDEPENDENT_AMBULATORY_CARE_PROVIDER_SITE_OTHER): Payer: MEDICARE

## 2010-07-17 DIAGNOSIS — J45909 Unspecified asthma, uncomplicated: Secondary | ICD-10-CM

## 2010-07-21 NOTE — Assessment & Plan Note (Signed)
Summary: Geoffry Paradise //kp   Nurse Visit   Allergies: 1)  ! Biaxin 2)  Aspirin (Aspirin) 3)  Codeine Phosphate (Codeine Phosphate)  Medication Administration  Injection # 1:    Medication: Xolair (omalizumab) 150mg     Diagnosis: EXTRINSIC ASTHMA, UNSPECIFIED (ICD-493.00)    Route: SQ    Site: L deltoid    Exp Date: 07/2013    Lot #: [id number]    Mfr: [employer]    Comments: 1.2 ML IN LEFT AND RIGHT ARM CHARGED 300MG  [id number] AND 04540    Given by: Dimas Millin IN ALLERGY LAB  Orders Added: 1)  Xolair (omalizumab) 150mg  [J2357] 2)  Administration xolair injection [98119]   Medication Administration  Injection # 1:    Medication: Xolair (omalizumab) 150mg     Diagnosis: EXTRINSIC ASTHMA, UNSPECIFIED (ICD-493.00)    Route: SQ    Site: L deltoid    Exp Date: 07/2013    Lot #: [id number]    Mfr: [employer]    Comments: 1.2 ML IN LEFT AND RIGHT ARM CHARGED 300MG  [id number] AND 56213    Given by: Dimas Millin IN ALLERGY LAB  Orders Added: 1)  Xolair (omalizumab) 150mg  [J2357] 2)  Administration xolair injection [08657]

## 2010-07-24 ENCOUNTER — Ambulatory Visit: Payer: Self-pay | Admitting: Internal Medicine

## 2010-07-29 ENCOUNTER — Ambulatory Visit (INDEPENDENT_AMBULATORY_CARE_PROVIDER_SITE_OTHER): Payer: MEDICARE | Admitting: Internal Medicine

## 2010-07-29 ENCOUNTER — Encounter: Payer: Self-pay | Admitting: Internal Medicine

## 2010-07-29 DIAGNOSIS — J449 Chronic obstructive pulmonary disease, unspecified: Secondary | ICD-10-CM

## 2010-07-29 DIAGNOSIS — J45909 Unspecified asthma, uncomplicated: Secondary | ICD-10-CM

## 2010-07-31 ENCOUNTER — Ambulatory Visit (INDEPENDENT_AMBULATORY_CARE_PROVIDER_SITE_OTHER): Payer: MEDICARE

## 2010-07-31 ENCOUNTER — Encounter: Payer: Self-pay | Admitting: Internal Medicine

## 2010-07-31 DIAGNOSIS — J45909 Unspecified asthma, uncomplicated: Secondary | ICD-10-CM

## 2010-08-04 NOTE — Assessment & Plan Note (Signed)
Summary: follow up visit/kcw   Primary Provider/Referring Provider:  Alonza Mendez  CC:  Follow up visit-asthma and COPD. Drippy nose from allergies..  History of Present Illness: July 25, 2009- Severe athma/ COPD FEV1 47%, Xolair. Came for routine Xolair inj. She had checked own BP today at home- 141/101. She asked it be checked when she arrived- 140/86. They sent her around for me to see. Cites recent stress.  Staying on Advair 500/50. No prednisone needed in over a year. Feels well, active  and walking regularly 15-30 minutes per day. Denies cough, chest pain, swelling feet.   She has preferred Ventolin HFA over other rescue albuterol. Has used it a couple of times a week recently due to Spring pollen season.   January 23, 2010- Severe asthma/ COPD, FEV1 47%/ Xoliar Has felt very well. Aware of a little throat congestion she blames on season change. Xolair continues to help. She has been walking an hour, 5 days/ week for the last 6 weeks at an airconditioned level walking place at her church. Uses Ventolin less now- months without need but doing it 1-2 x/ day in last week. Very sensitive to the colognes at church. No prednisone in the past year. Doesn't remember A1AT. CXR 2009 showed COPD with prominent hilae.  July 29, 2010-  Severe asthma/ COPD, FEV1 47%/ Xoliar Nurse-CC: Follow up visit-asthma and COPD. Drippy nose from allergies. Breathing doing very well. Minor watery rhinorhea and eyes burn a little, controlled with zyrtec. She remains very convinced that Xolair has made a huge difference for her. Uses her rescue inhaler less than once a week now. Through the winter she didn't need it even that often.       Asthma History    Asthma Control Assessment:    Age range: 12+ years    Symptoms: 0-2 days/week    Nighttime Awakenings: 0-2/month    Interferes w/ normal activity: no limitations    SABA use (not for EIB): 0-2 days/week    Asthma Control Assessment: Well  Controlled   Preventive Screening-Counseling & Management  Alcohol-Tobacco     Smoking Status: quit     Packs/Day: 0.75     Year Quit: 1979     Pack years: 8     Tobacco Counseling: not indicated; no tobacco use  Current Medications (verified): 1)  Xolair 150 Mg  Solr (Omalizumab) .... Each Arm Every 2 Wks 2)  Zyrtec Allergy 10 Mg  Tabs (Cetirizine Hcl) .... Once Daily 3)  Ventolin Hfa 108 (90 Base) Mcg/act  Aers (Albuterol Sulfate) .... 2 Puffs Every 4 Hours As Needed 4)  Fish Oil 1000 Mg  Caps (Omega-3 Fatty Acids) .... 2 Tabs Once Daily 5)  Multivitamins   Tabs (Multiple Vitamin) .... Take 1 Tablet By Mouth Once A Day 6)  Calcium 500 + D 500-125 Mg-Unit  Tabs (Calcium Carbonate-Vitamin D) .... Take 1 Tablet By Mouth Once A Day 7)  Advair Diskus 250-50 Mcg/dose Aepb (Fluticasone-Salmeterol) .Marland Kitchen.. 1 Puff and Rinse, Twice Daily  Allergies (verified): 1)  ! Biaxin 2)  Aspirin (Aspirin) 3)  Codeine Phosphate (Codeine Phosphate)  Past History:  Past Medical History: Last updated: 08/04/2007  OSTEOPENIA (ICD-733.90) COPD (ICD-496) ASTHMA (ICD-493.90) HEALTH SCREENING (ICD-V70.0) Asprin Urticaria  Past Surgical History: Last updated: 08/04/2007 thumb bunions hysterectopmy  Family History: Last updated: 27-Sep-2008  father died at age 74.  COPD in [place] mother died at age 64, cancer 3 brothers in good health 6 sisters, one died of  kidney cancer, and smoking, one died of smoking, and lung cancer.  One died of ovarian cancer.  The others had a liver transplant from alcoholism and the other two sisters are in good health sister- had melanoma  Social History: Last updated: 01/23/2010 Retired Single Former [place] Alcohol use-no Drug use-no Regular exercise-no Raised in orphanage after mother died when she was 9  Risk Factors: Exercise: no (03/31/2007)  Risk Factors: Smoking Status: quit (07/29/2010) Packs/Day: 0.75 (07/29/2010)  Review of Systems      See  HPI       The patient complains of shortness of breath with activity.  The patient denies shortness of breath at rest, productive cough, non-productive cough, coughing up blood, chest pain, irregular heartbeats, acid heartburn, indigestion, loss of appetite, weight change, abdominal pain, difficulty swallowing, sore throat, tooth/dental problems, headaches, nasal congestion/difficulty breathing through nose, sneezing, depression, and fever.    Vital Signs:  Patient profile:   70 year old female Menstrual status:  hysterectomy Height:      68.5 inches Weight:      221.38 pounds BMI:     33.29 O2 Sat:      93 % on Room air Pulse rate:   79 / minute BP sitting:   120 / 68  (left arm) Cuff size:   regular  Vitals Entered By: Reynaldo Minium CMA (July 29, 2010 11:16 AM)  O2 Flow:  Room air CC: Follow up visit-asthma and COPD. Drippy nose from allergies.   Physical Exam  Additional Exam:  General: A/Ox3; pleasant and cooperative, NAD, SKIN: no rash, lesions NODES: no lymphadenopathy HEENT: [place]/AT, EOM- WNL, Conjuctivae- clear, PERRLA, TM-WNL, Nose- clear, Throat- clear and wnl , no thrush NECK: Supple w/ fair ROM, JVD- none, normal carotid impulses w/o bruits Thyroid-  CHEST: clear. No wheeze at all. HEART: RRR, no m/g/r heard ABDOMEN: soft NGE:XBMW, nl pulses, no edema  NEURO: Grossly intact to observation      Impression & Recommendations:  Problem # 1:  EXTRINSIC ASTHMA, UNSPECIFIED (ICD-493.00) Good control with Xolair which has been an important game-changer.  Problem # 2:  UPPER RESPIRATORY INFECTION, ACUTE (ICD-465.9) Minor rhinorhea seems more likely a mild seasonal rhinitis and not infection.  Her updated medication list for this problem includes:    Zyrtec Allergy 10 Mg Tabs (Cetirizine hcl) ..... Once daily  Other Orders: Est. Patient Level III (41324)  Patient Instructions: 1)  Please schedule a follow-up appointment in 6 months. Please call sooner as needed.   2)  Continue Xolair

## 2010-08-04 NOTE — Assessment & Plan Note (Signed)
Summary: XOLIAR/CB   Nurse Visit   Allergies: 1)  ! Biaxin 2)  Aspirin (Aspirin) 3)  Codeine Phosphate (Codeine Phosphate)  Medication Administration  Injection # 1:    Medication: Xolair (omalizumab) 150mg     Diagnosis: EXTRINSIC ASTHMA, UNSPECIFIED (ICD-493.00)    Route: SQ    Site: L deltoid    Exp Date: 07/2013    Lot #: [id number]    Mfr: [employer]et[employer]    Comments: 1.2 ML IN LEFT AND RIGHT ARM 300MG  CHARGED [id number] AND 08657    Given by: Dimas Millin IN [facility]  Orders Added: 1)  Xolair (omalizumab) 150mg  [J2357] 2)  Administration xolair injection [84696]   Medication Administration  Injection # 1:    Medication: Xolair (omalizumab) 150mg     Diagnosis: EXTRINSIC ASTHMA, UNSPECIFIED (ICD-493.00)    Route: SQ    Site: L deltoid    Exp Date: 07/2013    Lot #: [id number]    Mfr: [employer]    Comments: 1.2 ML IN LEFT AND RIGHT ARM 300MG  CHARGED [id number] AND 13244    Given by: Dimas Millin IN [facility] LAB  Orders Added: 1)  Xolair (omalizumab) 150mg  [J2357] 2)  Administration xolair injection [01027]

## 2010-08-14 ENCOUNTER — Ambulatory Visit (INDEPENDENT_AMBULATORY_CARE_PROVIDER_SITE_OTHER): Payer: MEDICARE

## 2010-08-14 DIAGNOSIS — J45909 Unspecified asthma, uncomplicated: Secondary | ICD-10-CM

## 2010-08-14 MED ORDER — OMALIZUMAB 150 MG ~~LOC~~ SOLR
300.0000 mg | Freq: Once | SUBCUTANEOUS | Status: AC
  Administered 2010-08-14: 300 mg via SUBCUTANEOUS

## 2010-08-27 ENCOUNTER — Ambulatory Visit (INDEPENDENT_AMBULATORY_CARE_PROVIDER_SITE_OTHER): Payer: MEDICARE

## 2010-08-27 DIAGNOSIS — J45909 Unspecified asthma, uncomplicated: Secondary | ICD-10-CM

## 2010-08-27 MED ORDER — OMALIZUMAB 150 MG ~~LOC~~ SOLR
300.0000 mg | Freq: Once | SUBCUTANEOUS | Status: AC
  Administered 2010-08-27: 300 mg via SUBCUTANEOUS

## 2010-09-10 ENCOUNTER — Ambulatory Visit (INDEPENDENT_AMBULATORY_CARE_PROVIDER_SITE_OTHER): Payer: MEDICARE

## 2010-09-10 DIAGNOSIS — J45909 Unspecified asthma, uncomplicated: Secondary | ICD-10-CM

## 2010-09-10 MED ORDER — OMALIZUMAB 150 MG ~~LOC~~ SOLR
300.0000 mg | Freq: Once | SUBCUTANEOUS | Status: AC
  Administered 2010-09-10: 300 mg via SUBCUTANEOUS

## 2010-09-11 ENCOUNTER — Ambulatory Visit: Payer: MEDICARE

## 2010-09-25 ENCOUNTER — Ambulatory Visit (INDEPENDENT_AMBULATORY_CARE_PROVIDER_SITE_OTHER): Payer: MEDICARE

## 2010-09-25 DIAGNOSIS — J45909 Unspecified asthma, uncomplicated: Secondary | ICD-10-CM

## 2010-09-25 MED ORDER — OMALIZUMAB 150 MG ~~LOC~~ SOLR
300.0000 mg | Freq: Once | SUBCUTANEOUS | Status: AC
  Administered 2010-09-25: 300 mg via SUBCUTANEOUS

## 2010-10-06 NOTE — Assessment & Plan Note (Signed)
[facility]                             PULMONARY OFFICE NOTE   Laura Mendez, Laura Mendez                      MRN:          [id number]  DATE:04/28/2007                            DOB:          21-Oct-1940    PROBLEMS:  1. Chronic asthma/obstructive pulmonary disease.  2. ASPIRIN allergy/urticaria.  3. Question of right upper lobe nodule.   HISTORY:  She says that she feels the best she has in months. She likes  the cooler weather. Xolair has been a huge help with much less phlegm.  She does not like the HFA replacements for Alupent, but her pharmacist  suggested that she might like Ventolin HFA better than the others so she  wants to try it. We discussed this. She is using Advair once in the  morning and Pulmicort once in the evening, and she sprinkles her Foradil  powder from the capsule into her nebulizer machine on a p.r.n. basis. I  discussed this, having no idea whether she is getting an adequate  benefit dissolved in saline like that as opposed to delivery as a dry  powder.   MEDICATIONS:  We reviewed her medication list, which is charted.   OBJECTIVE:  Weight 215 pounds, blood pressure 116/74, pulse 69, room air  saturation 95%. Mild hoarseness. No strider. Pharynx is not red. There  is no neck vein distension. No adenopathy. Heart sounds are regular  without murmur or gallop. Lungs sound quiet, but really quite clear with  fairly good airflow to the bases. She is not laboring. There is no  cyanosis, clubbing, or edema.   IMPRESSION:  Stable asthma/chronic obstructive pulmonary disease doing  much better than she was this summer. Hopefully, the Xolair is making a  lasting contribution. Note that she did have flu shot.   PLAN:  Pneumococcal vaccine booster (original in 1996). Schedule return  4 months, earlier p.r.n.     Clinton D. Maple Hudson, MD, Tonny Bollman, FACP  Electronically Signed    CDY/MedQ  DD: 04/29/2007  DT: 04/30/2007  Job #:  [id number]   cc:   Tinnie Gens A. Tawanna Cooler, MD

## 2010-10-06 NOTE — Assessment & Plan Note (Signed)
[facility]                             PULMONARY OFFICE NOTE   COBIE, LEIDNER                      MRN:          [id number]  DATE:12/19/2006                            DOB:          1940/09/03    PULMONARY OFFICE FOLLOWUP.   PROBLEM:  1. Chronic asthma/obstructive pulmonary disease.  2. ASPIRIN ALLERGY/urticaria.  3. Question right upper lobe nodule.   HISTORY:  She has continued Xolair injections 300 mg every 2 weeks and  says it has helped her.  It seems to have dried up secretions.  She says  she was great last week but today has what she calls mild congestion  with nothing purulent.  She had stopped Singulair and stopped Fosamax.  High calcium diet.  Last short prednisone taper was about 2 weeks ago.  She is using Advair 500/50 only once a day because she uses Spiriva at  night and did not know she could combine them.  We discussed this.  We  reviewed how to manage and worsening of current symptoms.   MEDICATION:  1. Home nebulizer with albuterol.  2. Alupent rescue inhaler.  3. Prednisone p.r.n.  4. Advair 500/50.  5. Spiriva once daily.  6. Zyrtec 10 mg daily p.r.n.  7. Xolair injections every 2 weeks at 300 mg subcu.   DRUG INTOLERANT:  1. CODEINE WITH NAUSEA.  2. ASPIRIN WITH URTICARIA.   OBJECTIVE:  Weight 216 pounds, BP 122/70, pulse 76, room air saturation  91%.  Bilateral wheeze, dry cough, unlabored, no edema, pulse regular  without murmur, no adenopathy.   IMPRESSION:  Mild exacerbation asthma/chronic obstructive pulmonary  disease with a chronically severe background.   PLAN:  1. Increase Advair 500/50 to b.i.d. use with medical education talk      done.  2. Schedule return in 4 months, earlier p.r.n.     Clinton D. Maple Hudson, MD, Tonny Bollman, FACP  Electronically Signed    CDY/MedQ  DD: 12/19/2006  DT: 12/19/2006  Job #: [id number]   cc:   Tinnie Gens A. Tawanna Cooler, MD

## 2010-10-06 NOTE — Assessment & Plan Note (Signed)
[facility]                                 ON-CALL NOTE   Laura Mendez, Laura Mendez                        MRN:          [id number]  DATE:08/27/2007                            DOB:          1940-10-30    Seen on August 27, 2007, at 7:18 p.m. Phone number [phone].  Dr. Tawanna Cooler is  her regular doctor, and I am Dr. Milinda Antis on call.   The patient's chief complaint is headache, fairly severe chills and  fever.  As she spoke to me on the phone, she was going to the [facility] to be seen.  I told her to go ahead and wait to be seen at  the Urgent Care for her symptoms tonight, and that is what she is going  to do.     Marne A. Tower, MD  Electronically Signed    MAT/MedQ  DD: 08/27/2007  DT: 08/27/2007  Job #: [id number]

## 2010-10-08 ENCOUNTER — Ambulatory Visit (INDEPENDENT_AMBULATORY_CARE_PROVIDER_SITE_OTHER): Payer: MEDICARE

## 2010-10-08 DIAGNOSIS — J45909 Unspecified asthma, uncomplicated: Secondary | ICD-10-CM

## 2010-10-08 MED ORDER — OMALIZUMAB 150 MG ~~LOC~~ SOLR
300.0000 mg | Freq: Once | SUBCUTANEOUS | Status: AC
  Administered 2010-10-08: 300 mg via SUBCUTANEOUS

## 2010-10-09 ENCOUNTER — Ambulatory Visit: Payer: MEDICARE

## 2010-10-09 NOTE — Assessment & Plan Note (Signed)
[facility]                             PULMONARY OFFICE NOTE   SAJA, BARTOLINI                      MRN:          [id number]  DATE:06/20/2006                            DOB:          August 12, 1940    PROBLEMS:  1. Chronic asthma/obstructive pulmonary disease.  2. ASPIRIN allergy/urticaria.  3. Question right upper lobe nodule.   HISTORY:  No major change except some days she is a little bit tighter.  Overall, she has been pretty stable.  She feels she has had more energy  and less congestion, less bad sick days since she began Xolair 300 mg  every two weeks.  She continues Advair 500/50.   MEDICATIONS:  1. Nebulizer with albuterol q.i.d. p.r.n.  2. Alupent rescue inhaler.  3. Foradil was replaced with Advair 500/50.  4. Fosamax.  5. Prednisone 10 mg used occasionally p.r.n.  6. Singulair 10 mg.  7. Spiriva.  8. Zyrtec 10 mg.  9. Xolair 300 mg once a week.   OBJECTIVE:  VITAL SIGNS:  Weight 216 pounds.  BP 122/72, pulse regular  at 69, room air saturation 93%.  LUNGS:  There is bilateral wheeze and congestion but unlabored.  HEART:  Heart sounds are regular without murmur.  NECK:  There is no neck vein distention, stridor, or peripheral edema.   IMPRESSION:  Severe chronic asthma/chronic obstructive pulmonary  disease.  She does seem somewhat more stable on Xolair and her current  medicine profile.   PLAN:  Continue Xolair.  We discussed Alupent, but she is satisfied with  it, and it is refilled.  She is given a prescription for amoxicillin 500  mg t.i.d. x7 days to hold for p.r.n. use this winter.  Schedule return  in six months, earlier p.r.n.     Clinton D. Maple Hudson, MD, Tonny Bollman, FACP  Electronically Signed    CDY/MedQ  DD: 06/22/2006  DT: 06/22/2006  Job #: [id number]   cc:   Tinnie Gens A. Tawanna Cooler, MD

## 2010-10-09 NOTE — Assessment & Plan Note (Signed)
[facility]                             PULMONARY OFFICE NOTE   Laura Mendez, Laura Mendez                      MRN:          [id number]  DATE:08/18/2006                            DOB:          May 30, 1940    PROBLEMS:  1. Chronic asthma/obstructive pulmonary disease.  2. ASPIRIN ALLERGY/urticaria.  3. Question right upper lobe nodule.   HISTORY:  She has begun Xolair injections which have been well  tolerated, getting 300 mg every 2 weeks.  In the past 3 days she has  been noticing increasing chest tightness which woke her this morning,  needing nebulizer treatment right away.  No upper respiratory  discomfort.  She blames pollen, notices some brown sputum.  She started  herself on a prednisone taper, 30 mg, then 20, then 10.  No fever or  chest pain.   MEDICATIONS:  1. Nebulizer with albuterol.  2. Alupent rescue inhaler.  3. Fosamax.  4. Prednisone p.r.n.  5. Pulmicort 2 puffs daily.  6. Singulair 10 mg.  7. Spiriva.  8. Zyrtec 10 mg.  9. Advair 500/50.  10.Xolair 300 mg every 2 weeks.   DRUG INTOLERANT CODEINE WITH NAUSEA, ASPIRIN WITH HIVES.   OBJECTIVE:  Weight 209 pounds, blood pressure 102/70, pulse regular 87,  room air saturation 91%.  Diffuse wheezing and cough without dullness.  She is not using accessory muscles.  There is no neck vein distention or  stridor.  No evident nasal congestion.  She is on room air.  Heart  sounds are regular.  There is no edema.   IMPRESSION:  Exacerbation of asthma with chronic obstructive pulmonary  disease.   PLAN:  1. Nebulizer treatment here, Xopenex 1.25 mg so that she can get home      comfortably.  Depo-Medrol 80 mg IM.  Restart prednisone taper from      60 mg, dropping by 10 mg every 2 days.  2. Avelox 400 mg daily for 7 days.  3. Continue Xolair.  4. Keep scheduled appointment, fluids, rest.     Clinton D. Maple Hudson, MD, Tonny Bollman, FACP  Electronically Signed    CDY/MedQ  DD: 08/19/2006   DT: 08/20/2006  Job #: [id number]   cc:   Tinnie Gens A. Tawanna Cooler, MD

## 2010-10-09 NOTE — Assessment & Plan Note (Signed)
[facility]                               PULMONARY OFFICE NOTE   Laura Mendez, Laura Mendez                      MRN:          [id number]  DATE:02/17/2006                            DOB:          02-11-1941    PROBLEM:  1. Chronic asthma/obstructive pulmonary disease.  2. ASPIRIN allergy/urticaria.  3. Question right upper lobe nodule.   HISTORY:  She has continued Xolair injections 300 ml every 2 weeks.  She  believes, on Xolair, she has had less chest congestion, less nasal  congestion, and less need for prednisone and her metered inhaler.  She is  using prednisone 10 mg every day or 2, self-adjusting.  She feels stable  today, mostly staying in this summer to avoid heat and the fall rag weed.  She says there is less nasal congestion and rhinorrhea, but she stays  indoors.  She felt tighter in the chest after somebody with cologne sat near  her in the waiting room.   MEDICATIONS:  1. Albuterol by nebulizer q.i.d. p.r.n..  2. 50 rescue inhaler 2 puffs q.i.d. p.r.n. for occasional use.  3. Foradil 1 b.i.d.  4. Fosamax 70 mg per week.  5. Prednisone 10 mg tablets used p.r.n.  6. Pulmicort 2 puffs daily.  7. Singulair 10 mg.  8. Spiriva 1 daily.  9. Zyrtec 10 mg daily.   DRUG INTOLERANT:  CODEINE with nausea, ASPIRIN with urticaria.  She  continues Xolair injections as noted at 300 ml q.2 weeks, and she continues  Advair 500/50.   OBJECTIVE:  Weight 208 pounds, temperature 97.2, BP 120/80, pulse regular  80, room air saturation 91%.  There was bilateral inspiratory and expiratory wheezing without accessory  muscle use or evident distress.  No neck vein distension, stridor, peripheral edema, cyanosis, or clubbing.  HEART:  Sounds were regular with no murmur or gallop heard.   IMPRESSION:  Exacerbation of chronic asthma with chronic obstructive  pulmonary disease.   PLAN:  1. Xopenex 1.25 mg neb treatment given now for her ride home.  2.  Continue Xolair and other meds as listed.  3. Schedule return in 4 months, earlier p.r.n.       Clinton D. Maple Hudson, MD, FCCP, FACP      CDY/MedQ  DD:  02/20/2006  DT:  02/21/2006  Job #:  [id number]   cc:   Tinnie Gens A. Tawanna Cooler, MD

## 2010-10-22 ENCOUNTER — Ambulatory Visit (INDEPENDENT_AMBULATORY_CARE_PROVIDER_SITE_OTHER): Payer: Medicare Other

## 2010-10-22 ENCOUNTER — Ambulatory Visit: Payer: MEDICARE

## 2010-10-22 DIAGNOSIS — J45909 Unspecified asthma, uncomplicated: Secondary | ICD-10-CM

## 2010-10-22 MED ORDER — OMALIZUMAB 150 MG ~~LOC~~ SOLR
300.0000 mg | Freq: Once | SUBCUTANEOUS | Status: AC
  Administered 2010-10-22: 300 mg via SUBCUTANEOUS

## 2010-10-23 ENCOUNTER — Ambulatory Visit: Payer: MEDICARE

## 2010-11-06 ENCOUNTER — Ambulatory Visit (INDEPENDENT_AMBULATORY_CARE_PROVIDER_SITE_OTHER): Payer: Medicare Other

## 2010-11-06 DIAGNOSIS — J45909 Unspecified asthma, uncomplicated: Secondary | ICD-10-CM

## 2010-11-10 MED ORDER — OMALIZUMAB 150 MG ~~LOC~~ SOLR
300.0000 mg | Freq: Once | SUBCUTANEOUS | Status: AC
  Administered 2010-11-10: 300 mg via SUBCUTANEOUS

## 2010-11-19 ENCOUNTER — Ambulatory Visit (INDEPENDENT_AMBULATORY_CARE_PROVIDER_SITE_OTHER): Payer: Medicare Other

## 2010-11-19 DIAGNOSIS — J45909 Unspecified asthma, uncomplicated: Secondary | ICD-10-CM

## 2010-11-19 MED ORDER — OMALIZUMAB 150 MG ~~LOC~~ SOLR
300.0000 mg | Freq: Once | SUBCUTANEOUS | Status: AC
  Administered 2010-11-19: 300 mg via SUBCUTANEOUS

## 2010-11-20 ENCOUNTER — Ambulatory Visit: Payer: Medicare Other

## 2010-12-04 ENCOUNTER — Ambulatory Visit (INDEPENDENT_AMBULATORY_CARE_PROVIDER_SITE_OTHER): Payer: Medicare Other

## 2010-12-04 DIAGNOSIS — J45909 Unspecified asthma, uncomplicated: Secondary | ICD-10-CM

## 2010-12-07 MED ORDER — OMALIZUMAB 150 MG ~~LOC~~ SOLR
300.0000 mg | Freq: Once | SUBCUTANEOUS | Status: AC
  Administered 2010-12-07: 300 mg via SUBCUTANEOUS

## 2010-12-17 ENCOUNTER — Ambulatory Visit (INDEPENDENT_AMBULATORY_CARE_PROVIDER_SITE_OTHER): Payer: Medicare Other

## 2010-12-17 DIAGNOSIS — J45909 Unspecified asthma, uncomplicated: Secondary | ICD-10-CM

## 2010-12-18 ENCOUNTER — Ambulatory Visit: Payer: Medicare Other

## 2010-12-18 MED ORDER — OMALIZUMAB 150 MG ~~LOC~~ SOLR
300.0000 mg | Freq: Once | SUBCUTANEOUS | Status: AC
  Administered 2010-12-18: 300 mg via SUBCUTANEOUS

## 2011-01-01 ENCOUNTER — Ambulatory Visit (INDEPENDENT_AMBULATORY_CARE_PROVIDER_SITE_OTHER): Payer: Medicare Other

## 2011-01-01 DIAGNOSIS — J45909 Unspecified asthma, uncomplicated: Secondary | ICD-10-CM

## 2011-01-04 MED ORDER — OMALIZUMAB 150 MG ~~LOC~~ SOLR
300.0000 mg | Freq: Once | SUBCUTANEOUS | Status: AC
  Administered 2011-01-04: 300 mg via SUBCUTANEOUS

## 2011-01-15 ENCOUNTER — Ambulatory Visit (INDEPENDENT_AMBULATORY_CARE_PROVIDER_SITE_OTHER): Payer: Medicare Other

## 2011-01-15 DIAGNOSIS — J45909 Unspecified asthma, uncomplicated: Secondary | ICD-10-CM

## 2011-01-15 MED ORDER — OMALIZUMAB 150 MG ~~LOC~~ SOLR
300.0000 mg | Freq: Once | SUBCUTANEOUS | Status: AC
  Administered 2011-01-15: 300 mg via SUBCUTANEOUS

## 2011-01-29 ENCOUNTER — Ambulatory Visit (INDEPENDENT_AMBULATORY_CARE_PROVIDER_SITE_OTHER): Payer: Medicare Other

## 2011-01-29 DIAGNOSIS — J45909 Unspecified asthma, uncomplicated: Secondary | ICD-10-CM

## 2011-02-01 ENCOUNTER — Other Ambulatory Visit: Payer: Self-pay | Admitting: Internal Medicine

## 2011-02-01 DIAGNOSIS — J45909 Unspecified asthma, uncomplicated: Secondary | ICD-10-CM

## 2011-02-01 MED ORDER — OMALIZUMAB 150 MG ~~LOC~~ SOLR
300.0000 mg | Freq: Once | SUBCUTANEOUS | Status: AC
  Administered 2011-02-01: 300 mg via SUBCUTANEOUS

## 2011-02-03 ENCOUNTER — Encounter: Payer: Self-pay | Admitting: Internal Medicine

## 2011-02-03 ENCOUNTER — Ambulatory Visit (INDEPENDENT_AMBULATORY_CARE_PROVIDER_SITE_OTHER): Payer: Medicare Other | Admitting: Internal Medicine

## 2011-02-03 VITALS — BP 114/64 | HR 73 | Ht 70.0 in | Wt 215.0 lb

## 2011-02-03 DIAGNOSIS — J449 Chronic obstructive pulmonary disease, unspecified: Secondary | ICD-10-CM

## 2011-02-03 DIAGNOSIS — J45909 Unspecified asthma, uncomplicated: Secondary | ICD-10-CM

## 2011-02-03 DIAGNOSIS — Z23 Encounter for immunization: Secondary | ICD-10-CM

## 2011-02-03 NOTE — Progress Notes (Signed)
  Subjective:    Patient ID: Laura Mendez, female    DOB: [birth year], 70 y.o.   MRN: [id number]  HPI 02/03/11- 61 yoF former smoker followed for Severe asthma/ COPD FEV1 47%, Xolair Last here July 29, 2010 Reports doing fine. Wants flu shot.  She remains pleased with the difference she credits to Xolair. She stayed in this summer to avoid heat. Noticing some early fall nasal congestion. Has needed to use her rescue inhaler a couple of times and to take Zyrtec, blaming fall season. 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 Review of Systems Constitutional:   No-   weight loss, night sweats, fevers, chills, fatigue, lassitude. HEENT:   No-  headaches, difficulty swallowing, tooth/dental problems, sore throat,       No-  sneezing, itching, ear ache,             + nasal congestion, post nasal drip,  CV:  No-   chest pain, orthopnea, PND, swelling in lower extremities, anasarca, dizziness, palpitations Resp: Some  shortness of breath with exertion or at rest- unchanged             No-   productive cough,  No non-productive cough,  No-  coughing up of blood.              No-   change in color of mucus.  No- wheezing.   Skin: No-   rash or lesions. GI:  No-   heartburn, indigestion, abdominal pain, nausea, vomiting, diarrhea,                 change in bowel habits, loss of appetite GU: No-   dysuria, change in color of urine, no urgency or frequency.  No- flank pain. MS:  No-   joint pain or swelling.  No- decreased range of motion.  No- back pain. Neuro- grossly normal to observation, Or:  Psych:  No- change in mood or affect. No depression or anxiety.  No memory loss.      Objective:   Physical Exam General- Alert, Oriented, Affect-appropriate, Distress- none acute, cheerful Skin- rash-none, lesions- none, excoriation- none Lymphadenopathy- none Head- atraumatic            Eyes- Gross vision intact, PERRLA, conjunctivae clear secretions  Ears- Hearing, canals normal            Nose- Clear, No-Septal dev, mucus, polyps, erosion, perforation             Throat- Mallampati II , mucosa clear , drainage- none, tonsils- atrophic Neck- flexible , trachea midline, no stridor , thyroid nl, carotid no bruit Chest - symmetrical excursion , unlabored           Heart/CV- RRR , no murmur , no gallop  , no rub, nl s1 s2                           - JVD- none , edema- none, stasis changes- none, varices- none           Lung- clear to P&A- distant, wheeze- none, cough- none , dullness-none, rub- none           Chest wall-  Abd- tender-no, distended-no, bowel sounds-present, HSM- no Br/ Gen/ Rectal- Not done, not indicated Extrem- cyanosis- none, clubbing, none, atrophy- none, strength- nl Neuro- grossly intact to observation         Assessment & Plan:

## 2011-02-03 NOTE — Assessment & Plan Note (Addendum)
She is remarkably stable compared with how much she used to struggle. Response to Xolair is an indicator that more of her lung disease was allergic asthma than would have been expected.  Needs flu shot

## 2011-02-03 NOTE — Patient Instructions (Signed)
Flu vax  Continue Xolair

## 2011-02-06 NOTE — Assessment & Plan Note (Signed)
As discussed above. Xolair made big impact.  Need to consider when to update PFT

## 2011-02-11 ENCOUNTER — Ambulatory Visit (INDEPENDENT_AMBULATORY_CARE_PROVIDER_SITE_OTHER): Payer: Medicare Other

## 2011-02-11 DIAGNOSIS — J45909 Unspecified asthma, uncomplicated: Secondary | ICD-10-CM

## 2011-02-11 MED ORDER — OMALIZUMAB 150 MG ~~LOC~~ SOLR
300.0000 mg | Freq: Once | SUBCUTANEOUS | Status: AC
  Administered 2011-02-11: 300 mg via SUBCUTANEOUS

## 2011-02-12 ENCOUNTER — Ambulatory Visit: Payer: Medicare Other

## 2011-02-16 LAB — INFLUENZA A AND B ANTIGEN (CONVERTED LAB): Inflenza A Ag: NEGATIVE

## 2011-02-22 ENCOUNTER — Other Ambulatory Visit: Payer: Self-pay | Admitting: Family Medicine

## 2011-02-22 DIAGNOSIS — Z1231 Encounter for screening mammogram for malignant neoplasm of breast: Secondary | ICD-10-CM

## 2011-02-26 ENCOUNTER — Ambulatory Visit (INDEPENDENT_AMBULATORY_CARE_PROVIDER_SITE_OTHER): Payer: Medicare Other

## 2011-02-26 DIAGNOSIS — J45909 Unspecified asthma, uncomplicated: Secondary | ICD-10-CM

## 2011-03-01 DIAGNOSIS — J45909 Unspecified asthma, uncomplicated: Secondary | ICD-10-CM

## 2011-03-01 MED ORDER — OMALIZUMAB 150 MG ~~LOC~~ SOLR
300.0000 mg | Freq: Once | SUBCUTANEOUS | Status: AC
  Administered 2011-03-01: 300 mg via SUBCUTANEOUS

## 2011-03-12 ENCOUNTER — Ambulatory Visit (INDEPENDENT_AMBULATORY_CARE_PROVIDER_SITE_OTHER): Payer: Medicare Other

## 2011-03-12 DIAGNOSIS — J45909 Unspecified asthma, uncomplicated: Secondary | ICD-10-CM

## 2011-03-12 MED ORDER — OMALIZUMAB 150 MG ~~LOC~~ SOLR
300.0000 mg | Freq: Once | SUBCUTANEOUS | Status: AC
  Administered 2011-03-12: 300 mg via SUBCUTANEOUS

## 2011-03-22 ENCOUNTER — Encounter: Payer: Self-pay | Admitting: Internal Medicine

## 2011-03-26 ENCOUNTER — Ambulatory Visit (INDEPENDENT_AMBULATORY_CARE_PROVIDER_SITE_OTHER): Payer: Medicare Other

## 2011-03-26 DIAGNOSIS — J45909 Unspecified asthma, uncomplicated: Secondary | ICD-10-CM

## 2011-03-26 MED ORDER — OMALIZUMAB 150 MG ~~LOC~~ SOLR
300.0000 mg | Freq: Once | SUBCUTANEOUS | Status: AC
  Administered 2011-03-26: 300 mg via SUBCUTANEOUS

## 2011-04-05 ENCOUNTER — Ambulatory Visit
Admission: RE | Admit: 2011-04-05 | Discharge: 2011-04-05 | Disposition: A | Discharge status: Home / Self Care | Payer: Medicare Other | Source: Ambulatory Visit | Attending: Family Medicine | Admitting: Family Medicine

## 2011-04-05 DIAGNOSIS — Z1231 Encounter for screening mammogram for malignant neoplasm of breast: Secondary | ICD-10-CM

## 2011-04-05 IMAGING — MG MM DIGITAL SCREENING BILAT W/ CAD
4 series · 4 of 4 positions shown · non-contrast
Comparison: none

DG SCREEN MAMMOGRAM BILATERAL
Bilateral CC and MLO view(s) were taken.

DIGITAL SCREENING MAMMOGRAM WITH CAD:
There are scattered fibroglandular densities.  No masses or malignant type calcifications are 
identified.  Compared with prior studies.
Images were processed with CAD.

[R CC]
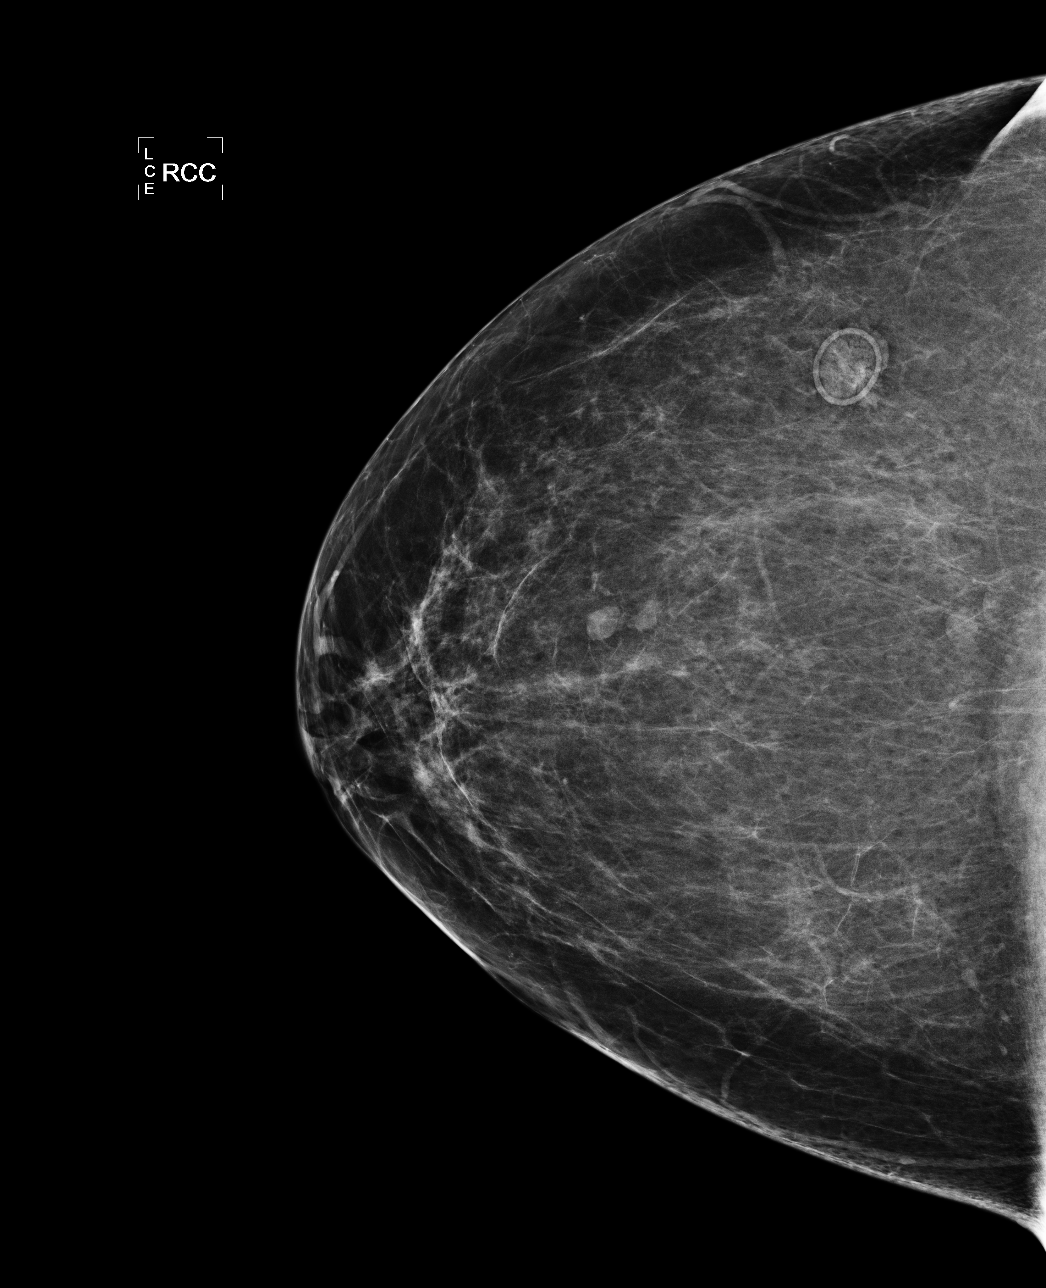

[L CC]
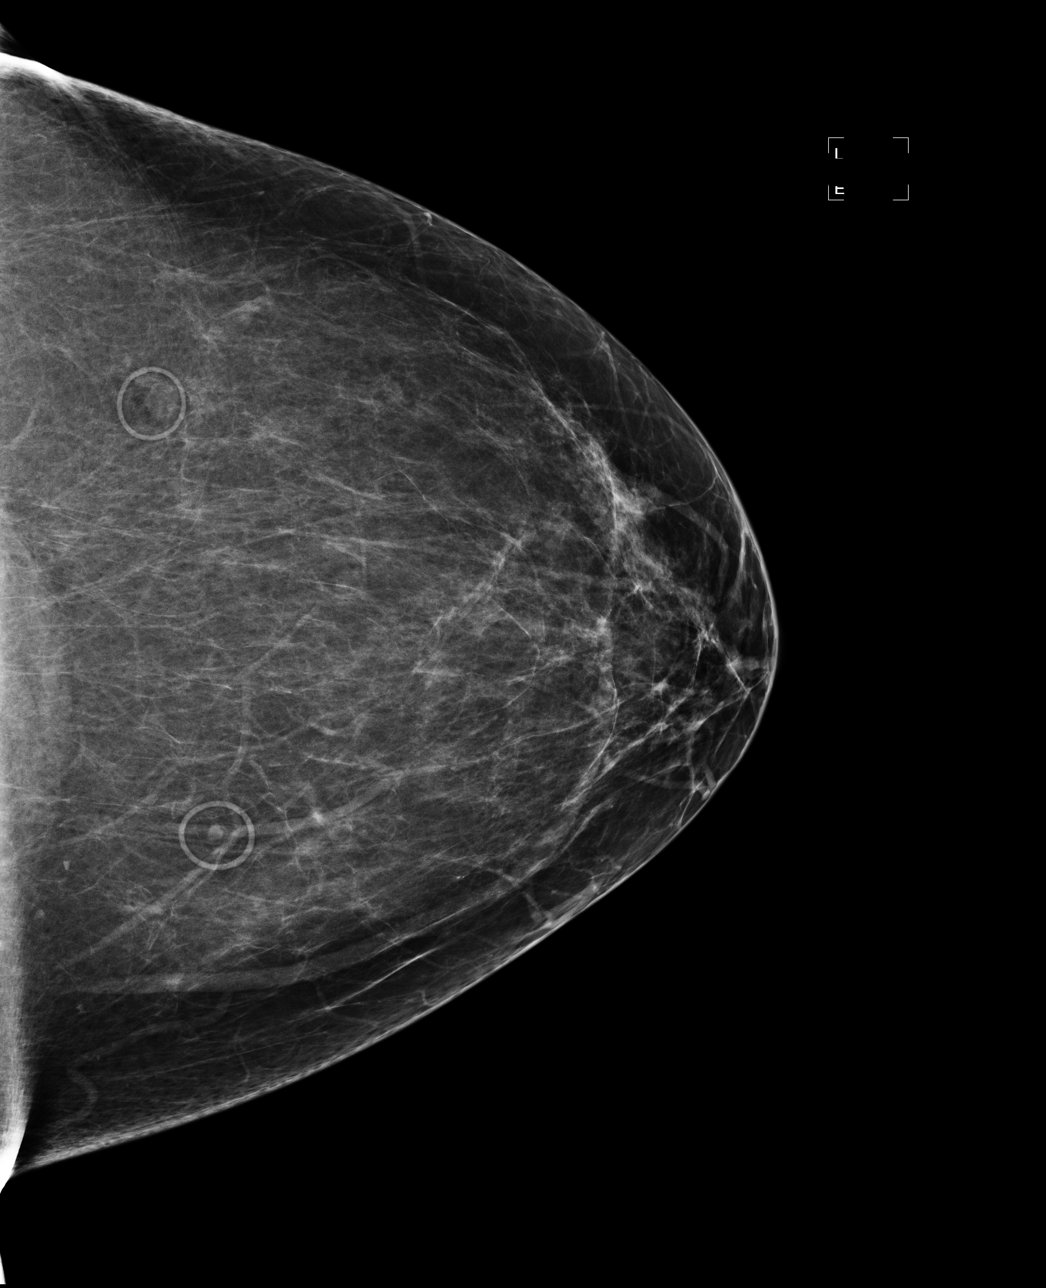

[L MLO]
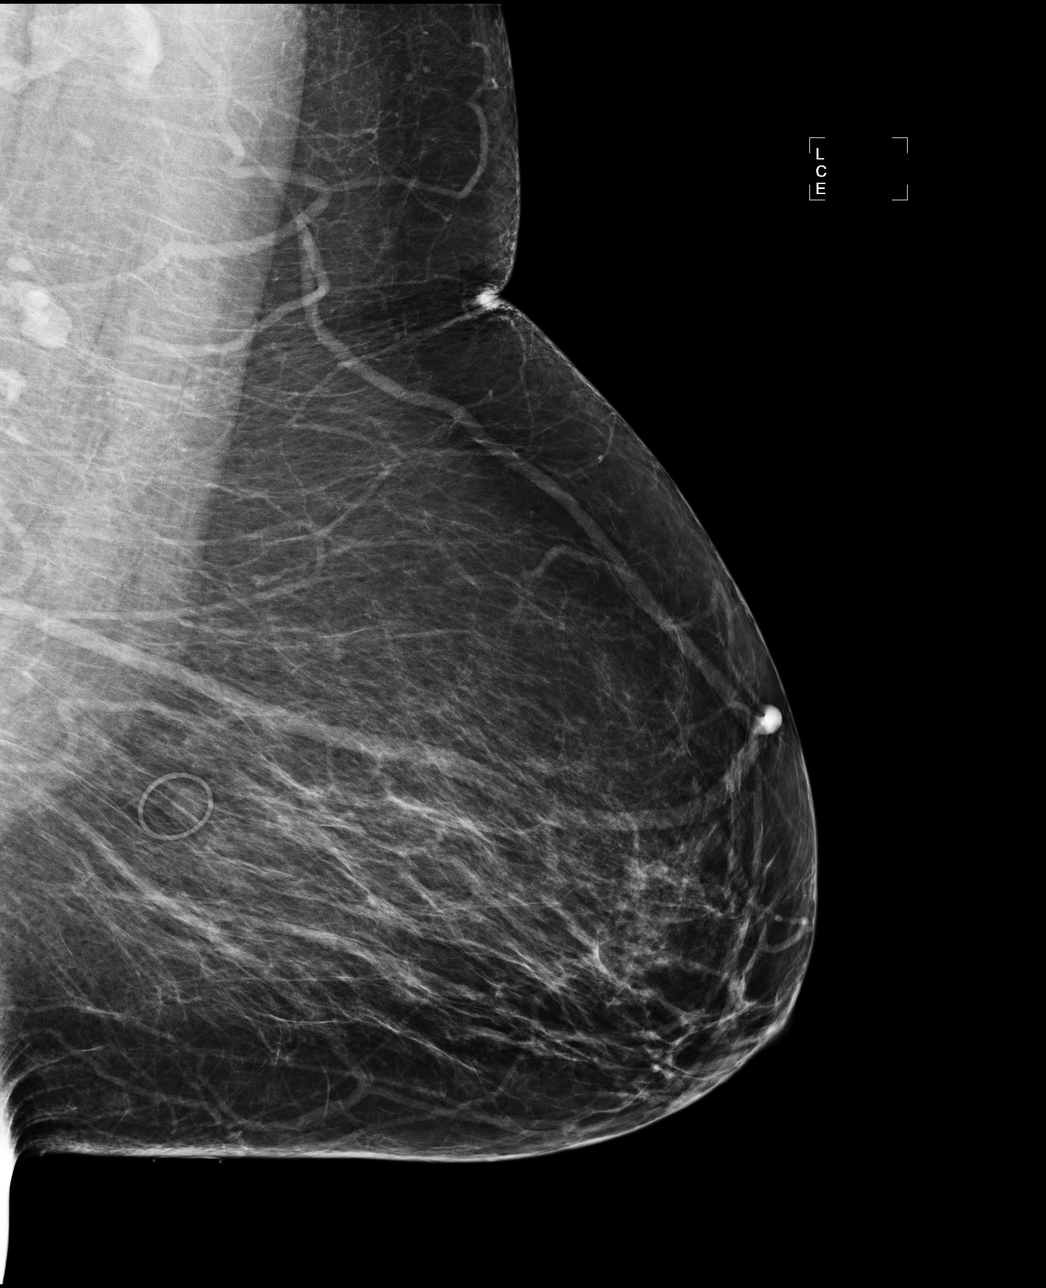

[R MLO]
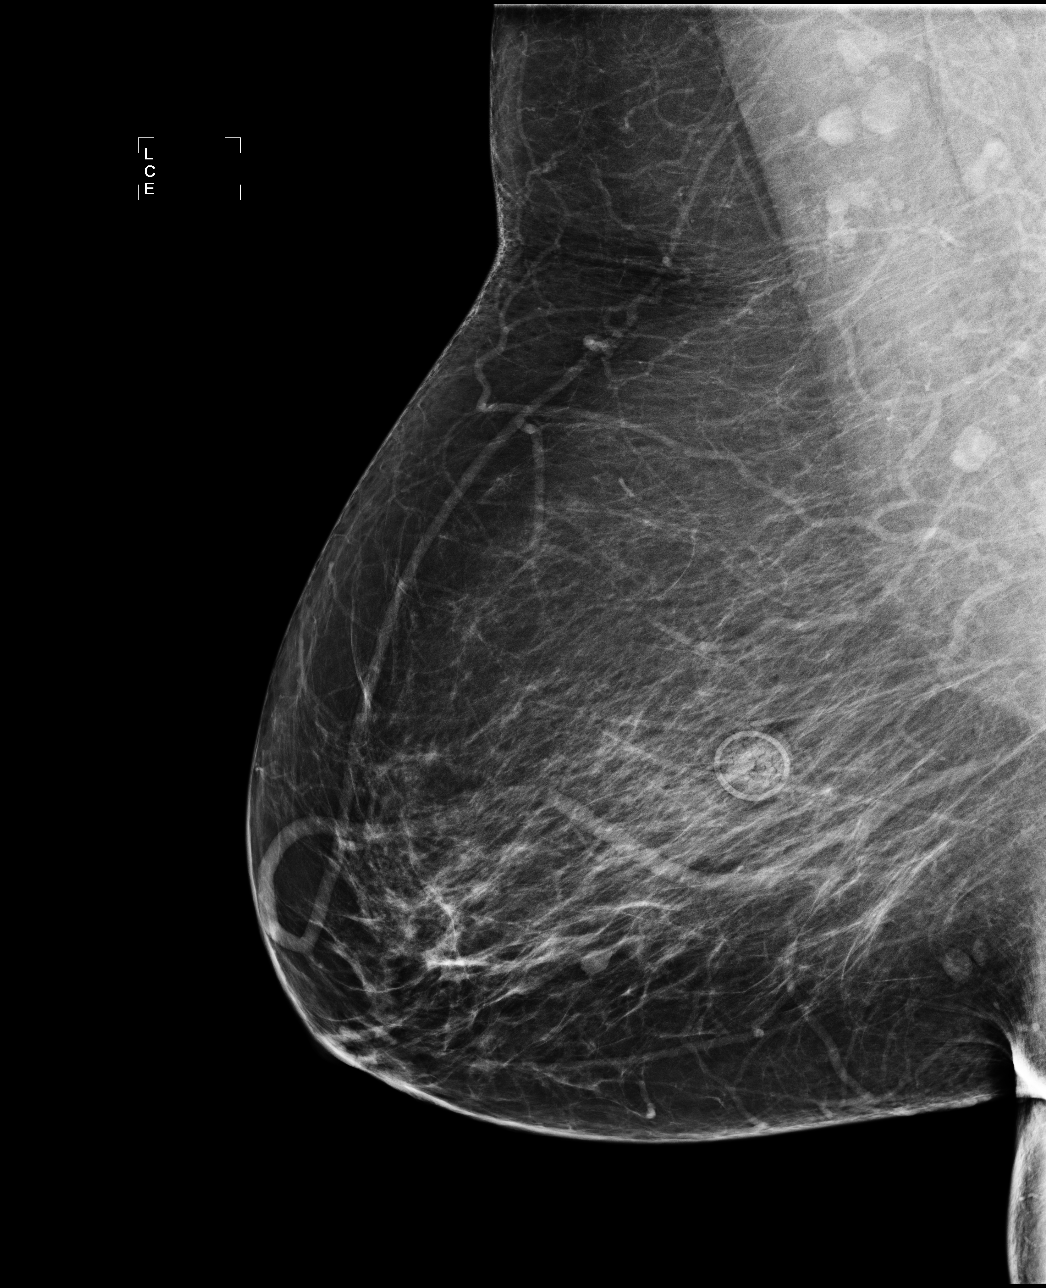

[4 of 4 positions shown; findings below may reference images not displayed]

IMPRESSION: No specific mammographic evidence of malignancy.  Next screening mammogram is recommended in one 
year.

A result letter of this screening mammogram will be mailed directly to the patient.

ASSESSMENT: Negative - BI-RADS 1

Screening mammogram in 1 year.
,

## 2011-04-07 ENCOUNTER — Other Ambulatory Visit: Payer: Self-pay | Admitting: Internal Medicine

## 2011-04-09 ENCOUNTER — Ambulatory Visit (INDEPENDENT_AMBULATORY_CARE_PROVIDER_SITE_OTHER): Payer: Medicare Other

## 2011-04-09 DIAGNOSIS — J45909 Unspecified asthma, uncomplicated: Secondary | ICD-10-CM

## 2011-04-13 DIAGNOSIS — J45909 Unspecified asthma, uncomplicated: Secondary | ICD-10-CM

## 2011-04-13 MED ORDER — OMALIZUMAB 150 MG ~~LOC~~ SOLR
300.0000 mg | Freq: Once | SUBCUTANEOUS | Status: AC
  Administered 2011-04-13: 300 mg via SUBCUTANEOUS

## 2011-04-23 ENCOUNTER — Ambulatory Visit (INDEPENDENT_AMBULATORY_CARE_PROVIDER_SITE_OTHER): Payer: Medicare Other

## 2011-04-23 DIAGNOSIS — J45909 Unspecified asthma, uncomplicated: Secondary | ICD-10-CM

## 2011-04-23 MED ORDER — OMALIZUMAB 150 MG ~~LOC~~ SOLR
300.0000 mg | Freq: Once | SUBCUTANEOUS | Status: AC
  Administered 2011-04-23: 300 mg via SUBCUTANEOUS

## 2011-05-07 ENCOUNTER — Ambulatory Visit (INDEPENDENT_AMBULATORY_CARE_PROVIDER_SITE_OTHER): Payer: Medicare Other

## 2011-05-07 DIAGNOSIS — J45909 Unspecified asthma, uncomplicated: Secondary | ICD-10-CM

## 2011-05-08 DIAGNOSIS — J45909 Unspecified asthma, uncomplicated: Secondary | ICD-10-CM

## 2011-05-08 MED ORDER — OMALIZUMAB 150 MG ~~LOC~~ SOLR
300.0000 mg | Freq: Once | SUBCUTANEOUS | Status: AC
  Administered 2011-05-08: 300 mg via SUBCUTANEOUS

## 2011-05-20 ENCOUNTER — Ambulatory Visit (INDEPENDENT_AMBULATORY_CARE_PROVIDER_SITE_OTHER): Payer: Medicare Other

## 2011-05-20 DIAGNOSIS — J45909 Unspecified asthma, uncomplicated: Secondary | ICD-10-CM

## 2011-05-20 MED ORDER — OMALIZUMAB 150 MG ~~LOC~~ SOLR
300.0000 mg | Freq: Once | SUBCUTANEOUS | Status: AC
  Administered 2011-05-20: 300 mg via SUBCUTANEOUS

## 2011-05-20 MED ORDER — OMALIZUMAB 150 MG ~~LOC~~ SOLR: 300.0000 mg | Freq: Once | SUBCUTANEOUS | Status: DC

## 2011-05-21 ENCOUNTER — Ambulatory Visit: Payer: Medicare Other

## 2011-06-03 ENCOUNTER — Ambulatory Visit (INDEPENDENT_AMBULATORY_CARE_PROVIDER_SITE_OTHER): Payer: Medicare Other

## 2011-06-03 DIAGNOSIS — J45909 Unspecified asthma, uncomplicated: Secondary | ICD-10-CM

## 2011-06-03 MED ORDER — OMALIZUMAB 150 MG ~~LOC~~ SOLR: 300.0000 mg | Freq: Once | SUBCUTANEOUS | Status: DC

## 2011-06-04 ENCOUNTER — Ambulatory Visit: Payer: Medicare Other

## 2011-06-17 ENCOUNTER — Ambulatory Visit (INDEPENDENT_AMBULATORY_CARE_PROVIDER_SITE_OTHER): Payer: Medicare Other

## 2011-06-17 DIAGNOSIS — J45909 Unspecified asthma, uncomplicated: Secondary | ICD-10-CM

## 2011-06-18 ENCOUNTER — Ambulatory Visit: Payer: Medicare Other

## 2011-06-21 DIAGNOSIS — J45909 Unspecified asthma, uncomplicated: Secondary | ICD-10-CM

## 2011-06-21 MED ORDER — OMALIZUMAB 150 MG ~~LOC~~ SOLR
300.0000 mg | Freq: Once | SUBCUTANEOUS | Status: AC
  Administered 2011-06-21: 300 mg via SUBCUTANEOUS

## 2011-06-23 ENCOUNTER — Ambulatory Visit: Payer: Medicare Other

## 2011-07-02 ENCOUNTER — Ambulatory Visit (INDEPENDENT_AMBULATORY_CARE_PROVIDER_SITE_OTHER): Payer: Medicare Other

## 2011-07-02 DIAGNOSIS — J45909 Unspecified asthma, uncomplicated: Secondary | ICD-10-CM

## 2011-07-02 MED ORDER — OMALIZUMAB 150 MG ~~LOC~~ SOLR
300.0000 mg | Freq: Once | SUBCUTANEOUS | Status: AC
  Administered 2011-07-02: 300 mg via SUBCUTANEOUS

## 2011-07-16 ENCOUNTER — Ambulatory Visit (INDEPENDENT_AMBULATORY_CARE_PROVIDER_SITE_OTHER): Payer: Medicare Other

## 2011-07-16 DIAGNOSIS — J45909 Unspecified asthma, uncomplicated: Secondary | ICD-10-CM

## 2011-07-16 MED ORDER — OMALIZUMAB 150 MG ~~LOC~~ SOLR
300.0000 mg | Freq: Once | SUBCUTANEOUS | Status: AC
  Administered 2011-07-16: 300 mg via SUBCUTANEOUS

## 2011-07-30 ENCOUNTER — Ambulatory Visit (INDEPENDENT_AMBULATORY_CARE_PROVIDER_SITE_OTHER): Payer: Medicare Other

## 2011-07-30 DIAGNOSIS — J45909 Unspecified asthma, uncomplicated: Secondary | ICD-10-CM

## 2011-07-30 MED ORDER — OMALIZUMAB 150 MG ~~LOC~~ SOLR
300.0000 mg | Freq: Once | SUBCUTANEOUS | Status: AC
  Administered 2011-07-30: 300 mg via SUBCUTANEOUS

## 2011-08-02 ENCOUNTER — Encounter: Payer: Self-pay | Admitting: Internal Medicine

## 2011-08-02 ENCOUNTER — Ambulatory Visit (INDEPENDENT_AMBULATORY_CARE_PROVIDER_SITE_OTHER): Payer: Medicare Other | Admitting: Internal Medicine

## 2011-08-02 ENCOUNTER — Ambulatory Visit (INDEPENDENT_AMBULATORY_CARE_PROVIDER_SITE_OTHER)
Admission: RE | Admit: 2011-08-02 | Discharge: 2011-08-02 | Disposition: A | Discharge status: Home / Self Care | Payer: Medicare Other | Source: Ambulatory Visit | Attending: Internal Medicine | Admitting: Internal Medicine

## 2011-08-02 DIAGNOSIS — J45909 Unspecified asthma, uncomplicated: Secondary | ICD-10-CM

## 2011-08-02 DIAGNOSIS — J449 Chronic obstructive pulmonary disease, unspecified: Secondary | ICD-10-CM

## 2011-08-02 DIAGNOSIS — J309 Allergic rhinitis, unspecified: Secondary | ICD-10-CM

## 2011-08-02 IMAGING — CR DG CHEST 2V
2 series · 2 of 2 positions shown · non-contrast
Comparison: [DATE]

CLINICAL DATA: COPD.

CHEST - 2 VIEW

[view not recorded (1 of 2)]
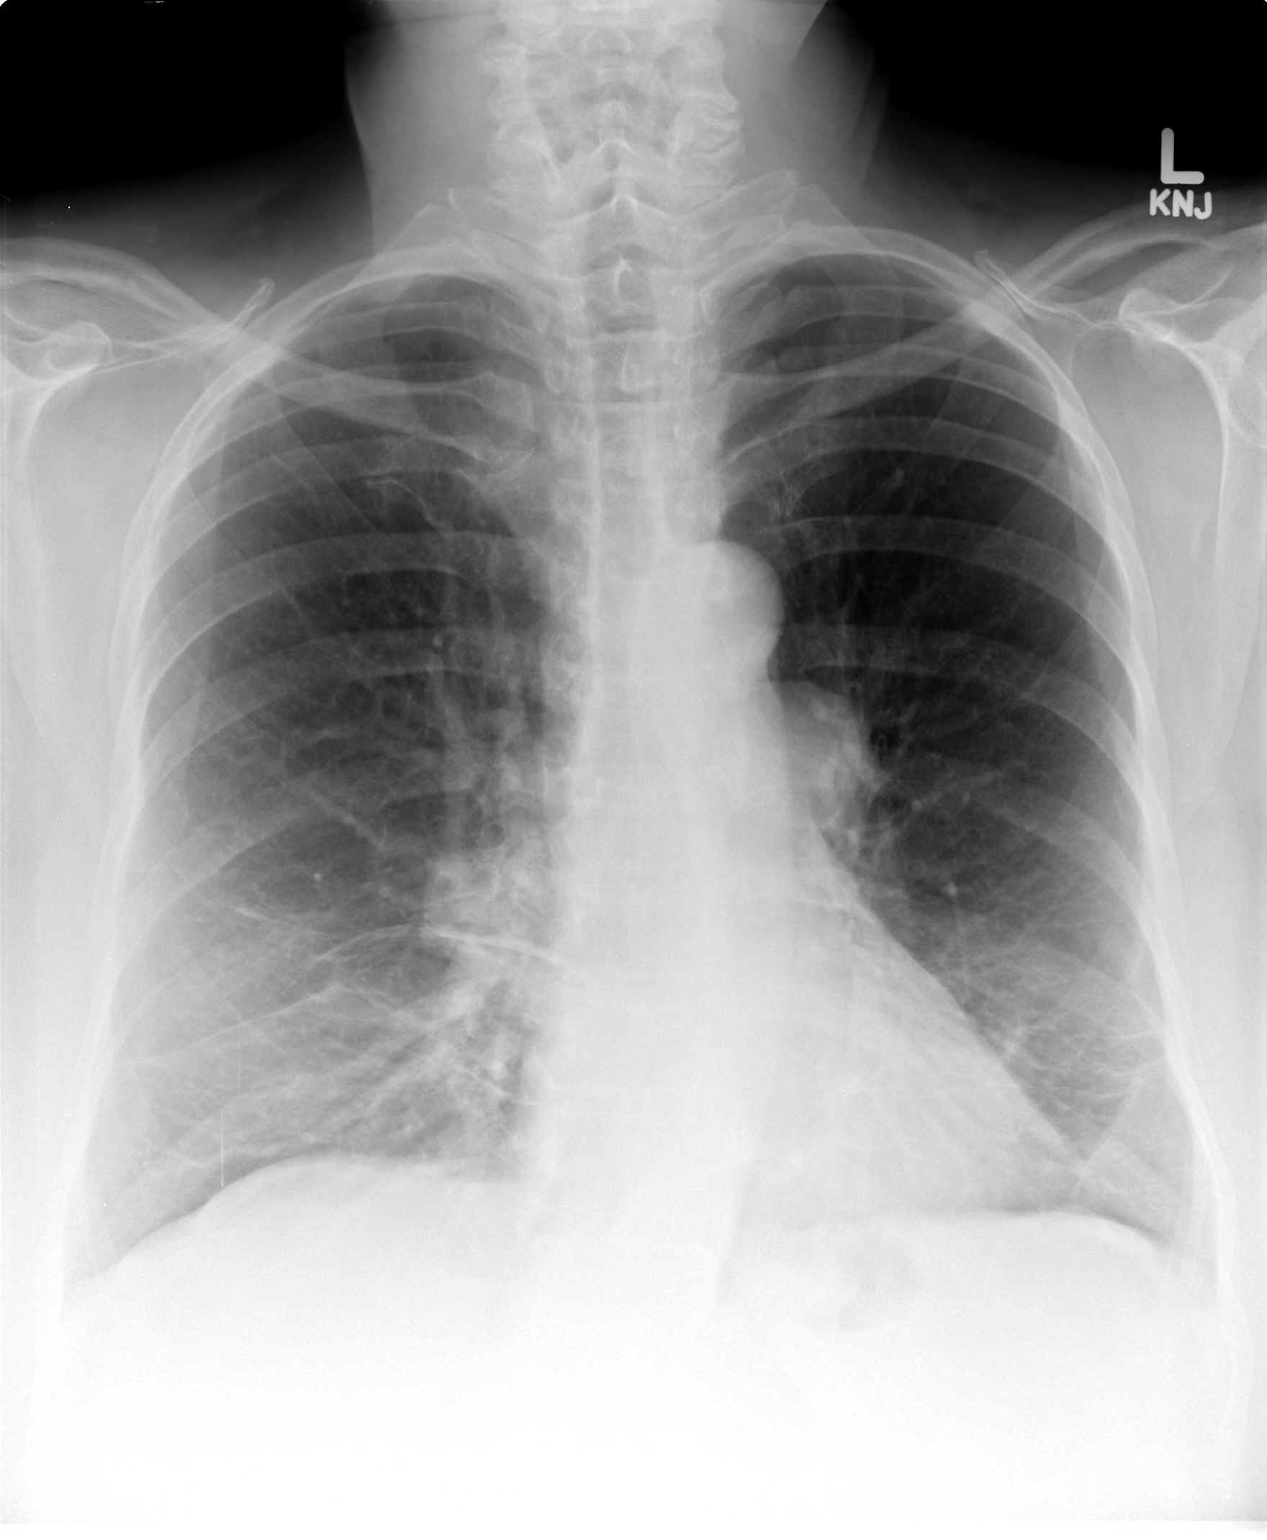

[view not recorded (2 of 2)]
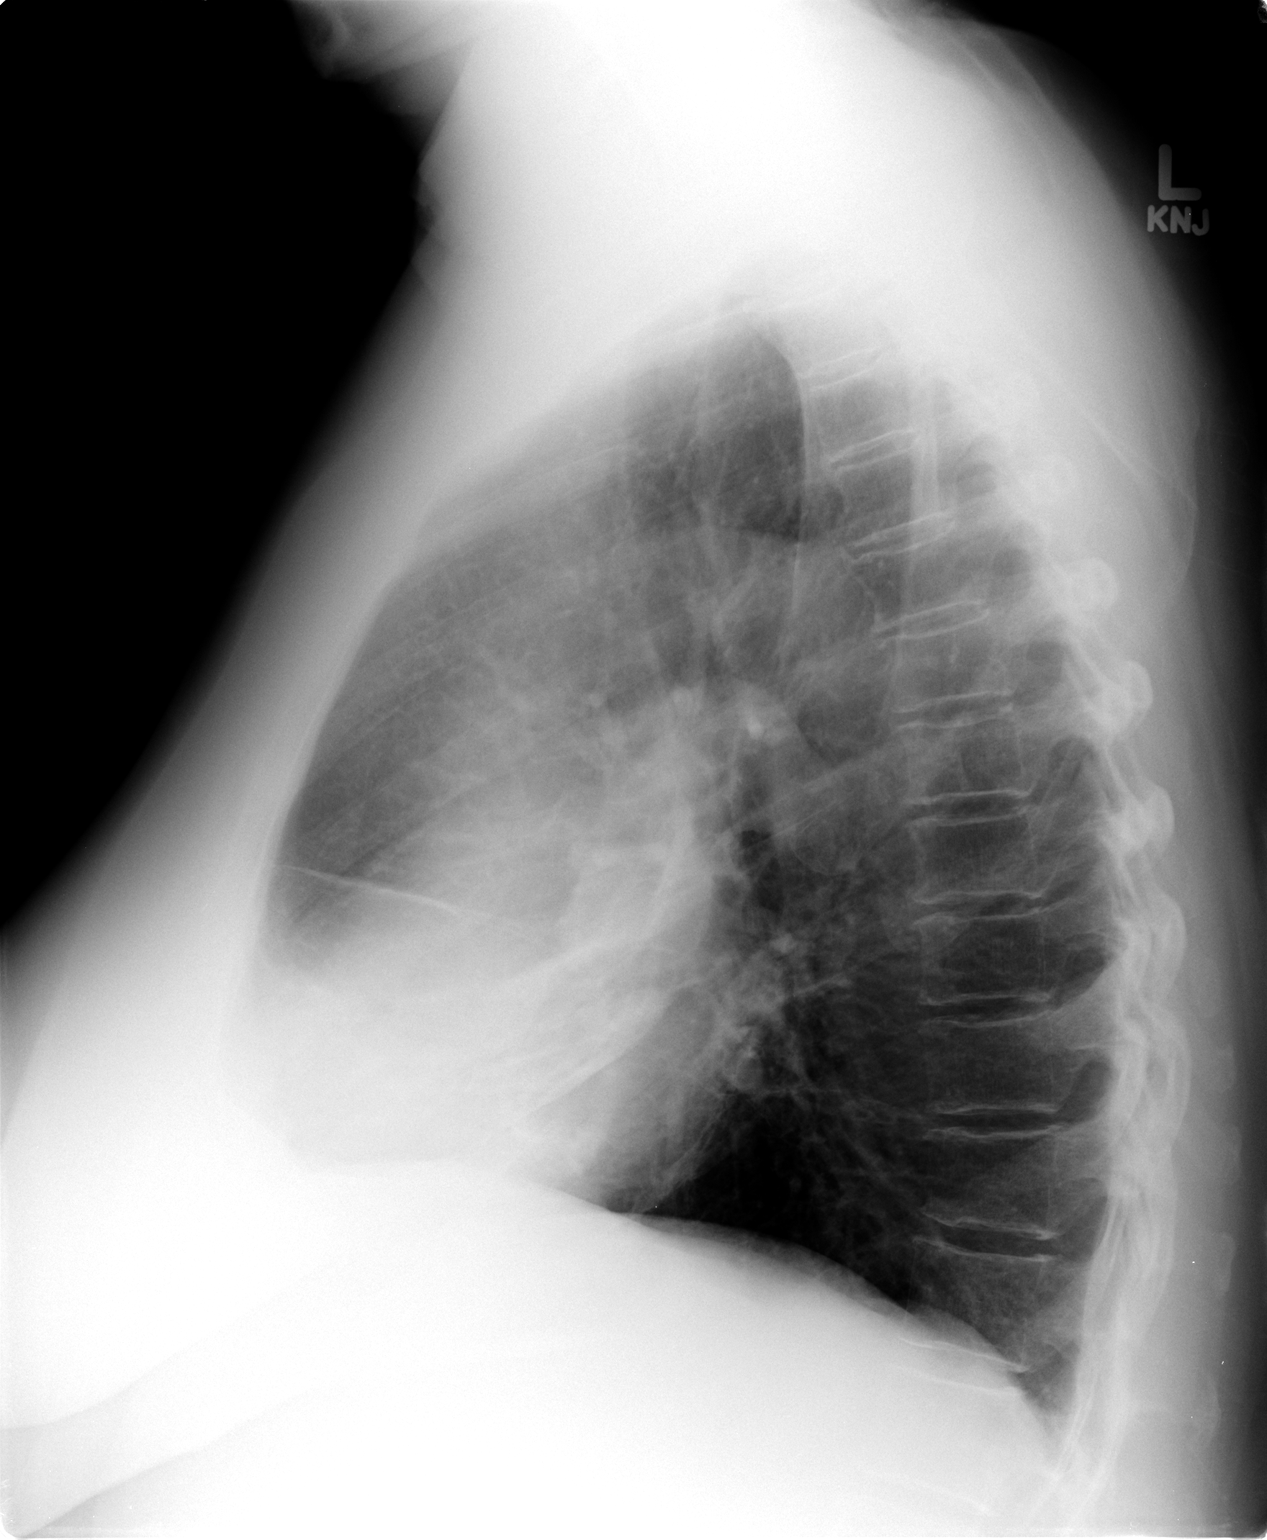

[2 of 2 positions shown; findings below may reference images not displayed]

FINDINGS: There is hyperinflation of the lungs compatible with
COPD.  Linear areas of scarring in both lower lung fields.  No
change.  No acute opacities or effusions.  No acute bony
abnormality.
IMPRESSION: COPD/chronic changes.  No active disease.

## 2011-08-02 NOTE — Patient Instructions (Signed)
Order- CXR  Dx COPD   

## 2011-08-02 NOTE — Progress Notes (Signed)
Patient ID: Laura Mendez, female    DOB: [birth year], 71 y.o.   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 Wants flu shot.  She remains pleased with the difference she credits to Xolair. She stayed in this summer to avoid heat. Noticing some early fall nasal congestion. Has needed to use her rescue inhaler a couple of times and to take Zyrtec, blaming fall season. 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  08/02/11-  32 yoF former smoker followed for severe asthma/ COPD FEV1 47%, Xolair Very complimentary of our office managers assistance with some administrative problems with her Xolair. She is very strongly convinced that Xolair keeps her breathing problems under control and describes increasing chest congestion when she had be off for a couple of doses. Got through the winter with a few minor colds which she show cough. Now walking one hour per day. Prefers Ventolin rescue inhaler over other brands she has tried. Recent mild nasal stuffiness despite Xolair. Occasional rhinorrhea is helped by Zyrtec.  Review of Systems-see HPI Constitutional:   No-   weight loss, night sweats, fevers, chills, fatigue, lassitude. HEENT:   No-  headaches, difficulty swallowing, tooth/dental problems, sore throat,       No-  sneezing, itching, ear ache,             + nasal congestion, post nasal drip,  CV:  No-   chest pain, orthopnea, PND, swelling in lower extremities, anasarca, dizziness, palpitations Resp: Some  shortness of breath with exertion or at rest- unchanged             No-   productive cough,  No non-productive cough,  No-  coughing up of blood.              No-   change in color of mucus.  No- wheezing.   Skin: No-   rash or lesions. GI:  No-   heartburn, indigestion, abdominal pain, nausea, vomiting, GU:  MS:  No-   joint pain or swelling.   Neuro-  grossly normal to observation, Or:  Psych:  No- change in mood or affect. No depression or anxiety.  No memory loss.      Objective:   Physical Exam General- Alert, Oriented, Affect-appropriate, Distress- none acute, cheerful Skin- rash-none, lesions- none, excoriation- none Lymphadenopathy- none Head- atraumatic            Eyes- Gross vision intact, PERRLA, conjunctivae clear secretions            Ears- Hearing, canals normal            Nose- Mild turbinate edema, No-Septal dev, mucus, polyps, erosion, perforation             Throat- Mallampati II , mucosa clear , drainage- none, tonsils- atrophic Neck- flexible , trachea midline, no stridor , thyroid nl, carotid no bruit Chest - symmetrical excursion , unlabored           Heart/CV- RRR , no murmur , no gallop  , no rub, nl s1 s2                           - JVD- none , edema- none, stasis changes- none, varices- none           Lung- clear to P&A- distant,  wheeze- none, cough- none , dullness-none, rub- none           Chest wall-  Abd-  Br/ Gen/ Rectal- Not done, not indicated Extrem- cyanosis- none, clubbing, none, atrophy- none, strength- nl Neuro- grossly intact to observation

## 2011-08-06 ENCOUNTER — Encounter: Payer: Self-pay | Admitting: Internal Medicine

## 2011-08-06 DIAGNOSIS — J3089 Other allergic rhinitis: Secondary | ICD-10-CM | POA: Insufficient documentation

## 2011-08-06 NOTE — Assessment & Plan Note (Addendum)
Very responsive to Xolair therapy with dramatic turnaround in frequency of exacerbations. Good control with current management. Plan-chest x-ray

## 2011-08-06 NOTE — Assessment & Plan Note (Signed)
Allergic and nonallergic rhinitis. Current nasal congestion might be due to early spring tree pollens except that we would expect Xolair to block that. What she is feeling is probably a vasomotor rhinitis related to wind and temperature changes. Plan-antihistamines and nasal steroids as needed.

## 2011-08-13 ENCOUNTER — Ambulatory Visit (INDEPENDENT_AMBULATORY_CARE_PROVIDER_SITE_OTHER): Payer: Medicare Other

## 2011-08-13 DIAGNOSIS — J45909 Unspecified asthma, uncomplicated: Secondary | ICD-10-CM

## 2011-08-13 MED ORDER — OMALIZUMAB 150 MG ~~LOC~~ SOLR
300.0000 mg | Freq: Once | SUBCUTANEOUS | Status: AC
  Administered 2011-08-13: 300 mg via SUBCUTANEOUS

## 2011-08-17 ENCOUNTER — Other Ambulatory Visit: Payer: Self-pay | Admitting: Family Medicine

## 2011-08-17 DIAGNOSIS — Z780 Asymptomatic menopausal state: Secondary | ICD-10-CM

## 2011-08-17 DIAGNOSIS — M899 Disorder of bone, unspecified: Secondary | ICD-10-CM

## 2011-08-27 ENCOUNTER — Ambulatory Visit (INDEPENDENT_AMBULATORY_CARE_PROVIDER_SITE_OTHER): Payer: Medicare Other

## 2011-08-27 DIAGNOSIS — J45909 Unspecified asthma, uncomplicated: Secondary | ICD-10-CM

## 2011-08-30 ENCOUNTER — Ambulatory Visit
Admission: RE | Admit: 2011-08-30 | Discharge: 2011-08-30 | Disposition: A | Discharge status: Home / Self Care | Payer: Medicare Other | Source: Ambulatory Visit | Attending: Family Medicine | Admitting: Family Medicine

## 2011-08-30 DIAGNOSIS — J45909 Unspecified asthma, uncomplicated: Secondary | ICD-10-CM

## 2011-08-30 DIAGNOSIS — Z780 Asymptomatic menopausal state: Secondary | ICD-10-CM

## 2011-08-30 MED ORDER — OMALIZUMAB 150 MG ~~LOC~~ SOLR
300.0000 mg | Freq: Once | SUBCUTANEOUS | Status: AC
  Administered 2011-08-30: 300 mg via SUBCUTANEOUS

## 2011-09-10 ENCOUNTER — Ambulatory Visit (INDEPENDENT_AMBULATORY_CARE_PROVIDER_SITE_OTHER): Payer: Medicare Other

## 2011-09-10 DIAGNOSIS — J45909 Unspecified asthma, uncomplicated: Secondary | ICD-10-CM

## 2011-09-10 MED ORDER — OMALIZUMAB 150 MG ~~LOC~~ SOLR
300.0000 mg | Freq: Once | SUBCUTANEOUS | Status: AC
  Administered 2011-09-10: 300 mg via SUBCUTANEOUS

## 2011-09-24 ENCOUNTER — Ambulatory Visit (INDEPENDENT_AMBULATORY_CARE_PROVIDER_SITE_OTHER): Payer: Medicare Other

## 2011-09-24 DIAGNOSIS — J45909 Unspecified asthma, uncomplicated: Secondary | ICD-10-CM

## 2011-09-27 MED ORDER — OMALIZUMAB 150 MG ~~LOC~~ SOLR
300.0000 mg | Freq: Once | SUBCUTANEOUS | Status: AC
  Administered 2011-09-27: 300 mg via SUBCUTANEOUS

## 2011-10-06 DIAGNOSIS — H5340 Unspecified visual field defects: Secondary | ICD-10-CM | POA: Insufficient documentation

## 2011-10-08 ENCOUNTER — Ambulatory Visit (INDEPENDENT_AMBULATORY_CARE_PROVIDER_SITE_OTHER): Payer: Medicare Other

## 2011-10-08 DIAGNOSIS — J45909 Unspecified asthma, uncomplicated: Secondary | ICD-10-CM

## 2011-10-08 MED ORDER — OMALIZUMAB 150 MG ~~LOC~~ SOLR
300.0000 mg | Freq: Once | SUBCUTANEOUS | Status: AC
  Administered 2011-10-08: 300 mg via SUBCUTANEOUS

## 2011-10-19 DIAGNOSIS — E785 Hyperlipidemia, unspecified: Secondary | ICD-10-CM | POA: Insufficient documentation

## 2011-10-19 DIAGNOSIS — Q208 Other congenital malformations of cardiac chambers and connections: Secondary | ICD-10-CM | POA: Insufficient documentation

## 2011-10-19 DIAGNOSIS — R931 Abnormal findings on diagnostic imaging of heart and coronary circulation: Secondary | ICD-10-CM | POA: Insufficient documentation

## 2011-10-22 ENCOUNTER — Ambulatory Visit (INDEPENDENT_AMBULATORY_CARE_PROVIDER_SITE_OTHER): Payer: Medicare Other

## 2011-10-22 DIAGNOSIS — J45909 Unspecified asthma, uncomplicated: Secondary | ICD-10-CM

## 2011-10-22 MED ORDER — OMALIZUMAB 150 MG ~~LOC~~ SOLR
300.0000 mg | Freq: Once | SUBCUTANEOUS | Status: AC
  Administered 2011-10-22: 300 mg via SUBCUTANEOUS

## 2011-11-05 ENCOUNTER — Ambulatory Visit (INDEPENDENT_AMBULATORY_CARE_PROVIDER_SITE_OTHER): Payer: Medicare Other

## 2011-11-05 DIAGNOSIS — J45909 Unspecified asthma, uncomplicated: Secondary | ICD-10-CM

## 2011-11-05 MED ORDER — OMALIZUMAB 150 MG ~~LOC~~ SOLR
300.0000 mg | Freq: Once | SUBCUTANEOUS | Status: AC
  Administered 2011-11-05: 300 mg via SUBCUTANEOUS

## 2011-11-06 ENCOUNTER — Other Ambulatory Visit: Payer: Self-pay | Admitting: Internal Medicine

## 2011-11-19 ENCOUNTER — Ambulatory Visit (INDEPENDENT_AMBULATORY_CARE_PROVIDER_SITE_OTHER): Payer: Medicare Other

## 2011-11-19 DIAGNOSIS — J45909 Unspecified asthma, uncomplicated: Secondary | ICD-10-CM

## 2011-11-19 MED ORDER — OMALIZUMAB 150 MG ~~LOC~~ SOLR
300.0000 mg | Freq: Once | SUBCUTANEOUS | Status: AC
  Administered 2011-11-19: 300 mg via SUBCUTANEOUS

## 2011-12-03 ENCOUNTER — Ambulatory Visit (INDEPENDENT_AMBULATORY_CARE_PROVIDER_SITE_OTHER): Payer: Medicare Other

## 2011-12-03 DIAGNOSIS — J45909 Unspecified asthma, uncomplicated: Secondary | ICD-10-CM

## 2011-12-03 MED ORDER — OMALIZUMAB 150 MG ~~LOC~~ SOLR
300.0000 mg | Freq: Once | SUBCUTANEOUS | Status: AC
  Administered 2011-12-03: 300 mg via SUBCUTANEOUS

## 2011-12-17 ENCOUNTER — Ambulatory Visit (INDEPENDENT_AMBULATORY_CARE_PROVIDER_SITE_OTHER): Payer: Medicare Other

## 2011-12-17 DIAGNOSIS — J45909 Unspecified asthma, uncomplicated: Secondary | ICD-10-CM

## 2011-12-17 MED ORDER — OMALIZUMAB 150 MG ~~LOC~~ SOLR
300.0000 mg | Freq: Once | SUBCUTANEOUS | Status: AC
  Administered 2011-12-17: 300 mg via SUBCUTANEOUS

## 2011-12-31 ENCOUNTER — Ambulatory Visit (INDEPENDENT_AMBULATORY_CARE_PROVIDER_SITE_OTHER): Payer: Medicare Other

## 2011-12-31 DIAGNOSIS — J45909 Unspecified asthma, uncomplicated: Secondary | ICD-10-CM

## 2011-12-31 MED ORDER — OMALIZUMAB 150 MG ~~LOC~~ SOLR
300.0000 mg | Freq: Once | SUBCUTANEOUS | Status: AC
  Administered 2011-12-31: 300 mg via SUBCUTANEOUS

## 2012-01-04 ENCOUNTER — Encounter: Payer: Self-pay | Admitting: Internal Medicine

## 2012-01-14 ENCOUNTER — Ambulatory Visit (INDEPENDENT_AMBULATORY_CARE_PROVIDER_SITE_OTHER): Payer: Medicare Other

## 2012-01-14 DIAGNOSIS — J45909 Unspecified asthma, uncomplicated: Secondary | ICD-10-CM

## 2012-01-14 MED ORDER — OMALIZUMAB 150 MG ~~LOC~~ SOLR
300.0000 mg | Freq: Once | SUBCUTANEOUS | Status: AC
  Administered 2012-01-14: 300 mg via SUBCUTANEOUS

## 2012-01-28 ENCOUNTER — Ambulatory Visit (INDEPENDENT_AMBULATORY_CARE_PROVIDER_SITE_OTHER): Payer: Medicare Other

## 2012-01-28 ENCOUNTER — Ambulatory Visit (INDEPENDENT_AMBULATORY_CARE_PROVIDER_SITE_OTHER): Payer: Medicare Other | Admitting: Internal Medicine

## 2012-01-28 ENCOUNTER — Encounter: Payer: Self-pay | Admitting: Internal Medicine

## 2012-01-28 VITALS — BP 122/74 | HR 68 | Ht 70.0 in | Wt 209.6 lb

## 2012-01-28 DIAGNOSIS — J45909 Unspecified asthma, uncomplicated: Secondary | ICD-10-CM

## 2012-01-28 DIAGNOSIS — J449 Chronic obstructive pulmonary disease, unspecified: Secondary | ICD-10-CM

## 2012-01-28 MED ORDER — OMALIZUMAB 150 MG ~~LOC~~ SOLR
300.0000 mg | Freq: Once | SUBCUTANEOUS | Status: AC
  Administered 2012-01-28: 300 mg via SUBCUTANEOUS

## 2012-01-28 NOTE — Patient Instructions (Addendum)
We can continue Xolair shots   Please call as needed

## 2012-01-28 NOTE — Progress Notes (Signed)
Patient ID: Laura Mendez, female    DOB: Feb 15, 1941, 71 y.o.   MRN: [id number]  HPI 02/03/11- 71 yoF former smoker followed for Severe asthma/ COPD FEV1 47%, Xolair Last here July 29, 2010 Reports doing fine. Wants flu shot.  She remains pleased with the difference she credits to Xolair. She stayed in this summer to avoid heat. Noticing some early fall nasal congestion. Has needed to use her rescue inhaler a couple of times and to take Zyrtec, blaming fall season. 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  08/02/11-  71 yoF former smoker followed for severe asthma/ COPD FEV1 47%, Xolair Very complimentary of our office managers assistance with some administrative problems with her Xolair. She is very strongly convinced that Xolair keeps her breathing problems under control and describes increasing chest congestion when she had be off for a couple of doses. Got through the winter with a few minor colds which she show cough. Now walking one hour per day. Prefers Ventolin rescue inhaler over other brands she has tried. Recent mild nasal stuffiness despite Xolair. Occasional rhinorrhea is helped by Zyrtec.  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 Plans flu vaccine later  Review of Systems-see HPI Constitutional:   No-   weight loss, night sweats, fevers, chills, fatigue, lassitude. HEENT:   No-  headaches, difficulty swallowing, tooth/dental problems, sore throat,       No-  sneezing, itching, ear ache, + nasal congestion, post nasal drip,  CV:  No-   chest pain, orthopnea, PND, swelling in lower extremities, anasarca, dizziness, palpitations Resp:  +Some shortness of breath with exertion or at rest- unchanged             No-   productive cough,  No non-productive cough,  No-  coughing up of blood.              No-    change in color of mucus.  No- wheezing.   Skin: No-   rash or lesions. GI:  No-   heartburn, indigestion, abdominal pain, nausea, vomiting, GU:  MS:  No-   joint pain or swelling.   Neuro- nothing unusual  Psych:  No- change in mood or affect. No depression or anxiety.  No memory loss  Objective:   Ht 5\' 10"  (1.778 m)  Wt 209 lb 9.6 oz (95.074 kg)  BMI 30.07 kg/m2  General- Alert, Oriented, Affect-appropriate, Distress- none acute, cheerful Skin- rash-none, lesions- none, excoriation- none Lymphadenopathy- none Head- atraumatic            Eyes- Gross vision intact, PERRLA, conjunctivae clear secretions            Ears- Hearing, canals normal            Nose- Mild turbinate edema, No-Septal dev, mucus, polyps, erosion, perforation             Throat- Mallampati II , mucosa clear , drainage- none, tonsils- atrophic Neck- flexible , trachea midline, no stridor , thyroid nl, carotid no bruit Chest - symmetrical excursion , unlabored           Heart/CV- RRR , no murmur , no gallop  , no rub, nl s1 s2                           -  JVD- none , edema- none, stasis changes- none, varices- none           Lung- clear to P&A- distant, wheeze- none, cough- none , dullness-none, rub- none           Chest wall-  Abd-  Br/ Gen/ Rectal- Not done, not indicated Extrem- cyanosis- none, clubbing, none, atrophy- none, strength- nl Neuro- grossly intact to observation

## 2012-02-06 NOTE — Assessment & Plan Note (Signed)
She remains quite outspoken about how much help Xolair has been to her. We talked about studies been completed now which will guide how long to continue Xolair.

## 2012-02-11 ENCOUNTER — Ambulatory Visit (INDEPENDENT_AMBULATORY_CARE_PROVIDER_SITE_OTHER): Payer: Medicare Other

## 2012-02-11 DIAGNOSIS — J45909 Unspecified asthma, uncomplicated: Secondary | ICD-10-CM

## 2012-02-14 DIAGNOSIS — J45909 Unspecified asthma, uncomplicated: Secondary | ICD-10-CM

## 2012-02-14 MED ORDER — OMALIZUMAB 150 MG ~~LOC~~ SOLR
300.0000 mg | Freq: Once | SUBCUTANEOUS | Status: AC
  Administered 2012-02-14: 300 mg via SUBCUTANEOUS

## 2012-02-17 ENCOUNTER — Ambulatory Visit (INDEPENDENT_AMBULATORY_CARE_PROVIDER_SITE_OTHER): Payer: Medicare Other

## 2012-02-17 DIAGNOSIS — Z23 Encounter for immunization: Secondary | ICD-10-CM

## 2012-02-18 DIAGNOSIS — Z23 Encounter for immunization: Secondary | ICD-10-CM

## 2012-02-25 ENCOUNTER — Ambulatory Visit (INDEPENDENT_AMBULATORY_CARE_PROVIDER_SITE_OTHER): Payer: Medicare Other

## 2012-02-25 DIAGNOSIS — J45909 Unspecified asthma, uncomplicated: Secondary | ICD-10-CM

## 2012-02-25 MED ORDER — OMALIZUMAB 150 MG ~~LOC~~ SOLR
300.0000 mg | Freq: Once | SUBCUTANEOUS | Status: AC
  Administered 2012-02-25: 300 mg via SUBCUTANEOUS

## 2012-03-06 ENCOUNTER — Other Ambulatory Visit: Payer: Self-pay | Admitting: Family Medicine

## 2012-03-06 DIAGNOSIS — Z1231 Encounter for screening mammogram for malignant neoplasm of breast: Secondary | ICD-10-CM

## 2012-03-10 ENCOUNTER — Ambulatory Visit (INDEPENDENT_AMBULATORY_CARE_PROVIDER_SITE_OTHER): Payer: Medicare Other

## 2012-03-10 DIAGNOSIS — J45909 Unspecified asthma, uncomplicated: Secondary | ICD-10-CM

## 2012-03-13 MED ORDER — OMALIZUMAB 150 MG ~~LOC~~ SOLR
300.0000 mg | Freq: Once | SUBCUTANEOUS | Status: AC
  Administered 2012-03-13: 300 mg via SUBCUTANEOUS

## 2012-03-24 ENCOUNTER — Ambulatory Visit (INDEPENDENT_AMBULATORY_CARE_PROVIDER_SITE_OTHER): Payer: Medicare Other

## 2012-03-24 DIAGNOSIS — J45909 Unspecified asthma, uncomplicated: Secondary | ICD-10-CM

## 2012-03-27 MED ORDER — OMALIZUMAB 150 MG ~~LOC~~ SOLR
300.0000 mg | Freq: Once | SUBCUTANEOUS | Status: AC
  Administered 2012-03-27: 300 mg via SUBCUTANEOUS

## 2012-03-28 ENCOUNTER — Other Ambulatory Visit: Payer: Self-pay | Admitting: Internal Medicine

## 2012-04-07 ENCOUNTER — Ambulatory Visit (INDEPENDENT_AMBULATORY_CARE_PROVIDER_SITE_OTHER): Payer: Medicare Other

## 2012-04-07 DIAGNOSIS — J45909 Unspecified asthma, uncomplicated: Secondary | ICD-10-CM

## 2012-04-07 MED ORDER — OMALIZUMAB 150 MG ~~LOC~~ SOLR
300.0000 mg | Freq: Once | SUBCUTANEOUS | Status: AC
  Administered 2012-04-07: 300 mg via SUBCUTANEOUS

## 2012-04-17 ENCOUNTER — Ambulatory Visit
Admission: RE | Admit: 2012-04-17 | Discharge: 2012-04-17 | Disposition: A | Discharge status: Home / Self Care | Payer: Medicare Other | Source: Ambulatory Visit | Attending: Family Medicine | Admitting: Family Medicine

## 2012-04-17 DIAGNOSIS — Z1231 Encounter for screening mammogram for malignant neoplasm of breast: Secondary | ICD-10-CM

## 2012-04-17 IMAGING — MG MM DIGITAL SCREENING BILAT W/ CAD
4 series · 4 of 4 positions shown · non-contrast
Comparison: Previous exams.

CLINICAL DATA: Screening.

DIGITAL BILATERAL SCREENING MAMMOGRAM WITH CAD

[R CC]
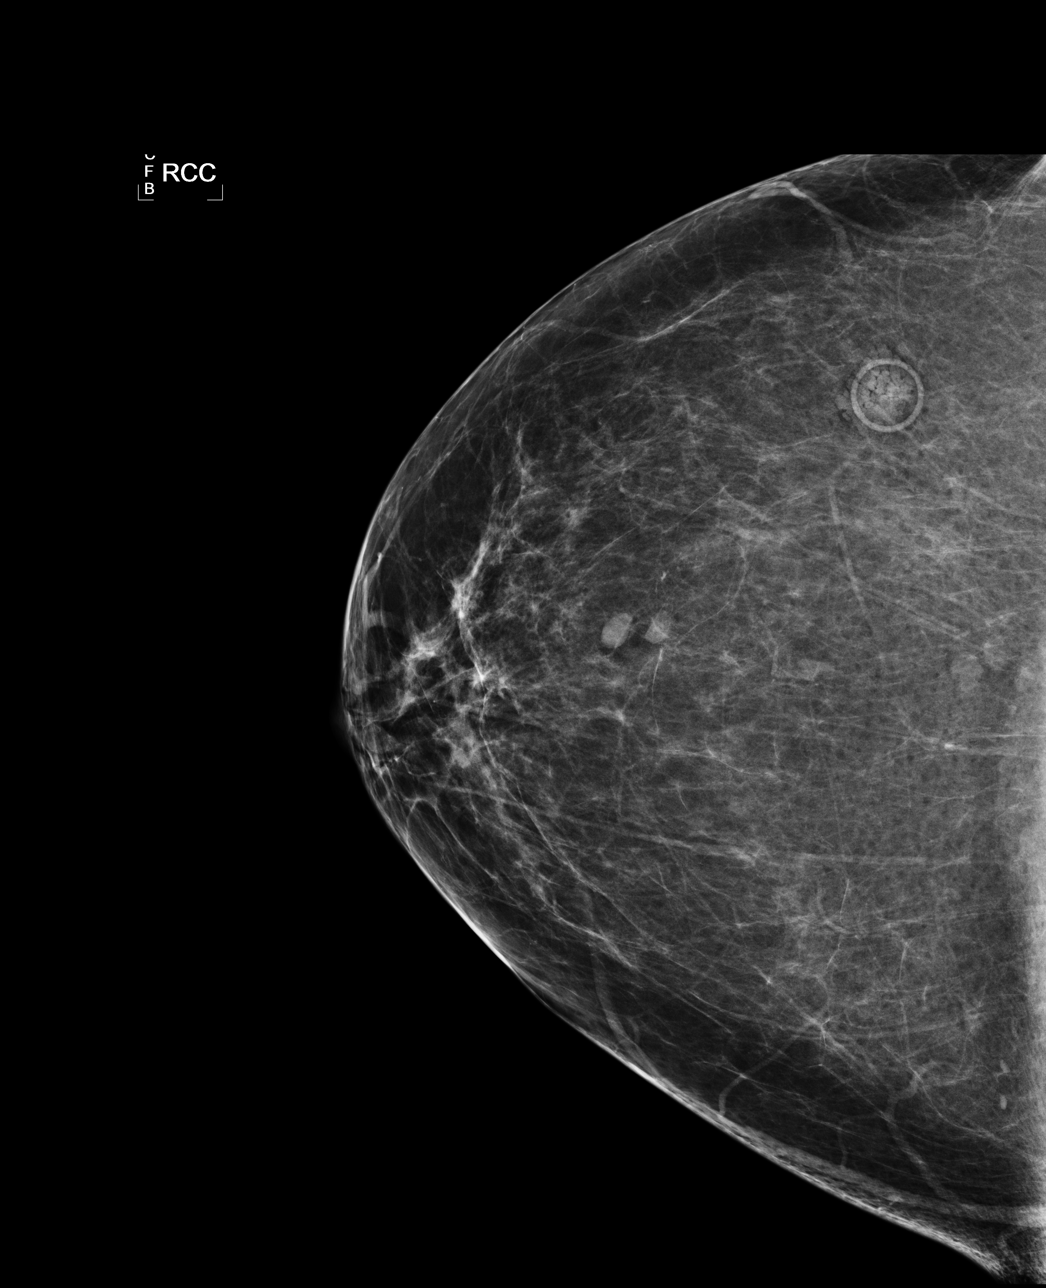

[L CC]
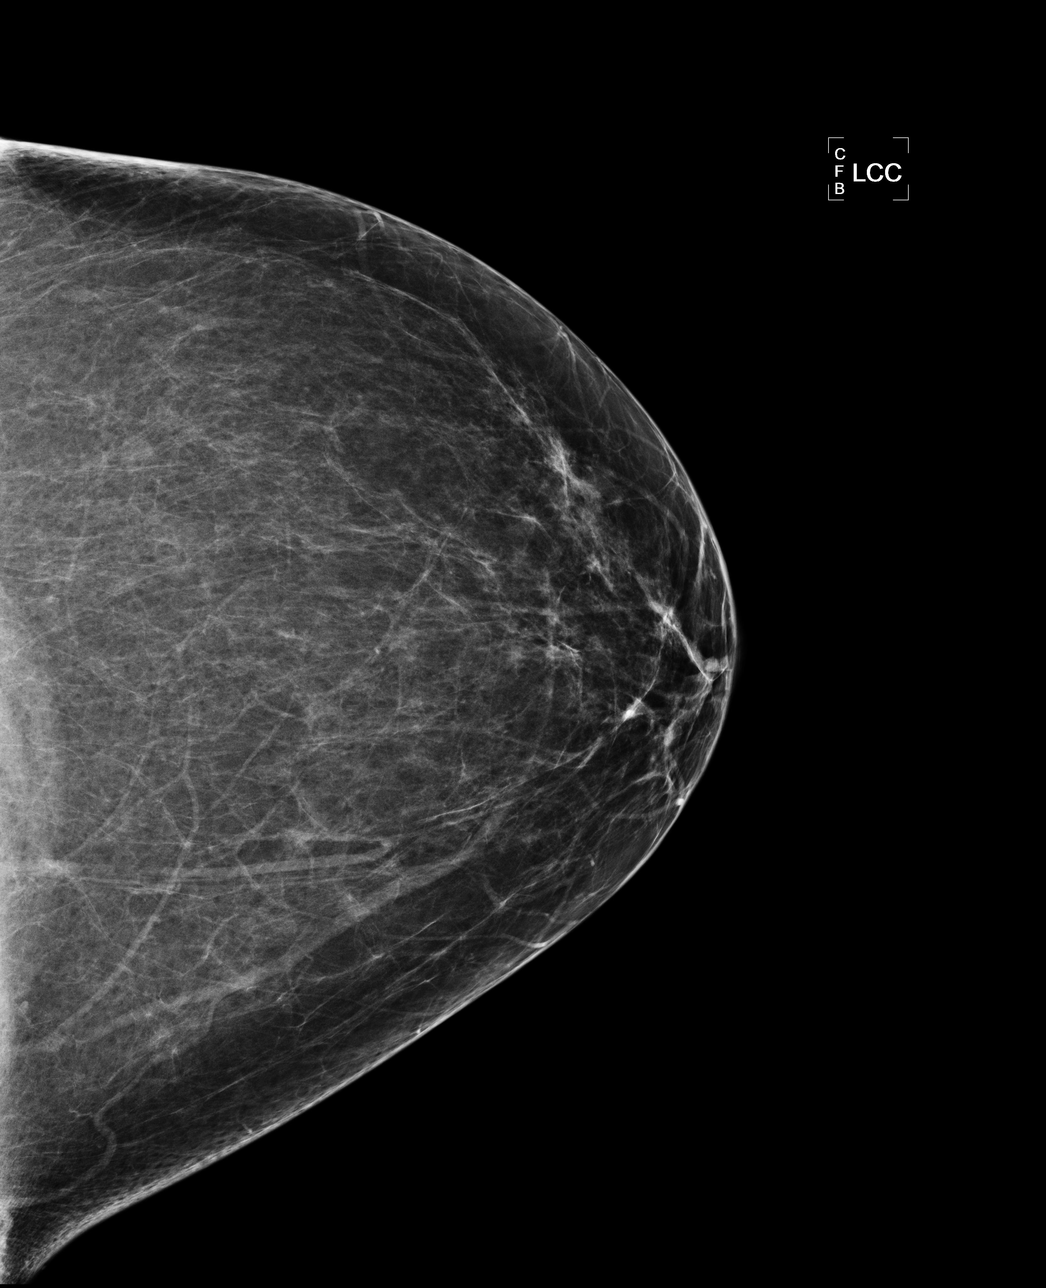

[L MLO]
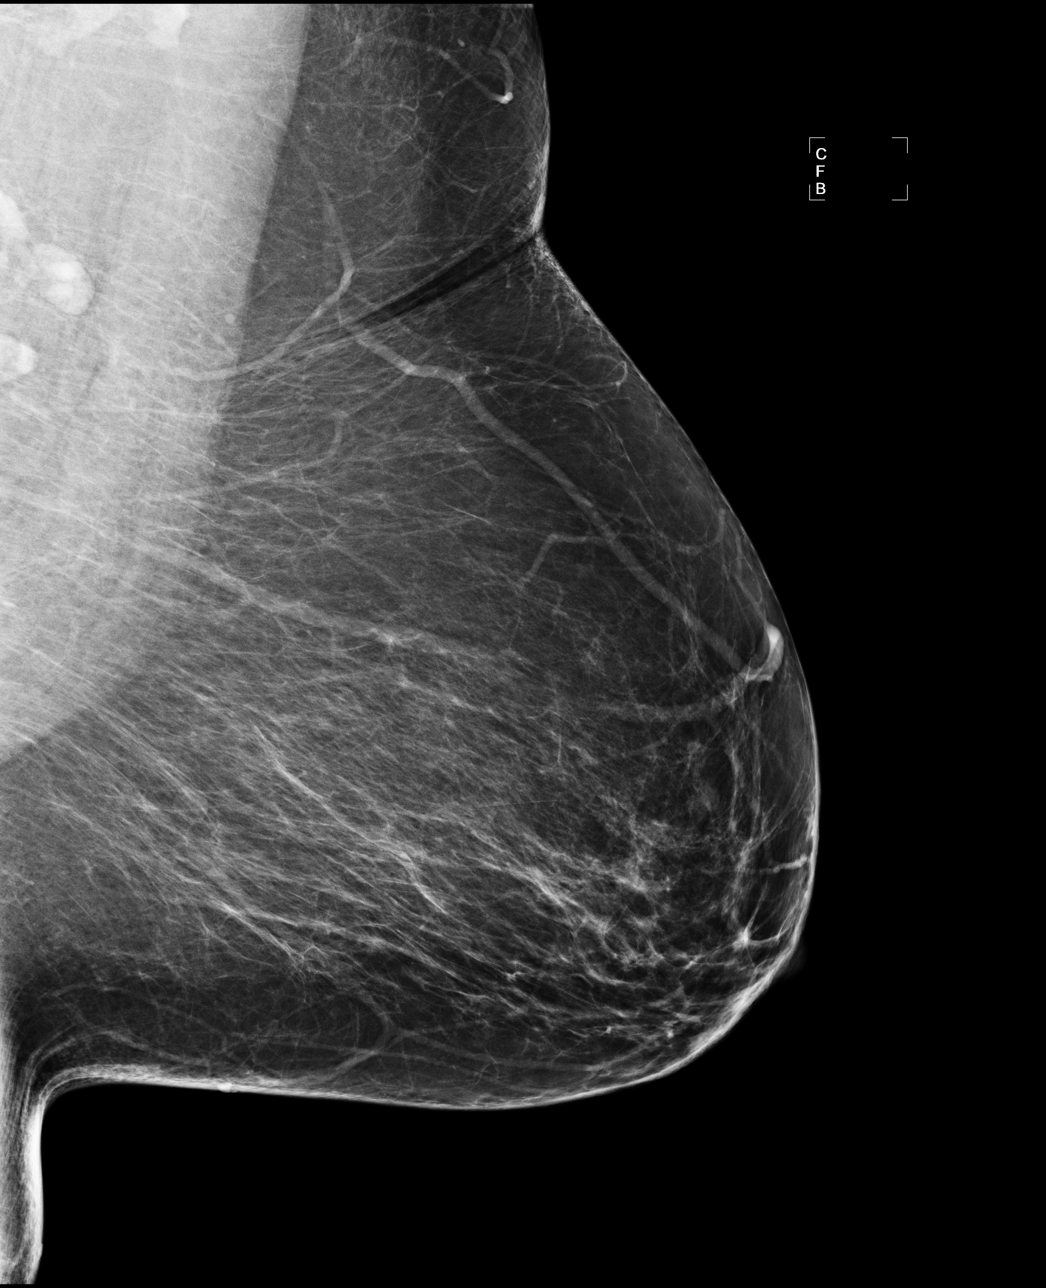

[R MLO]
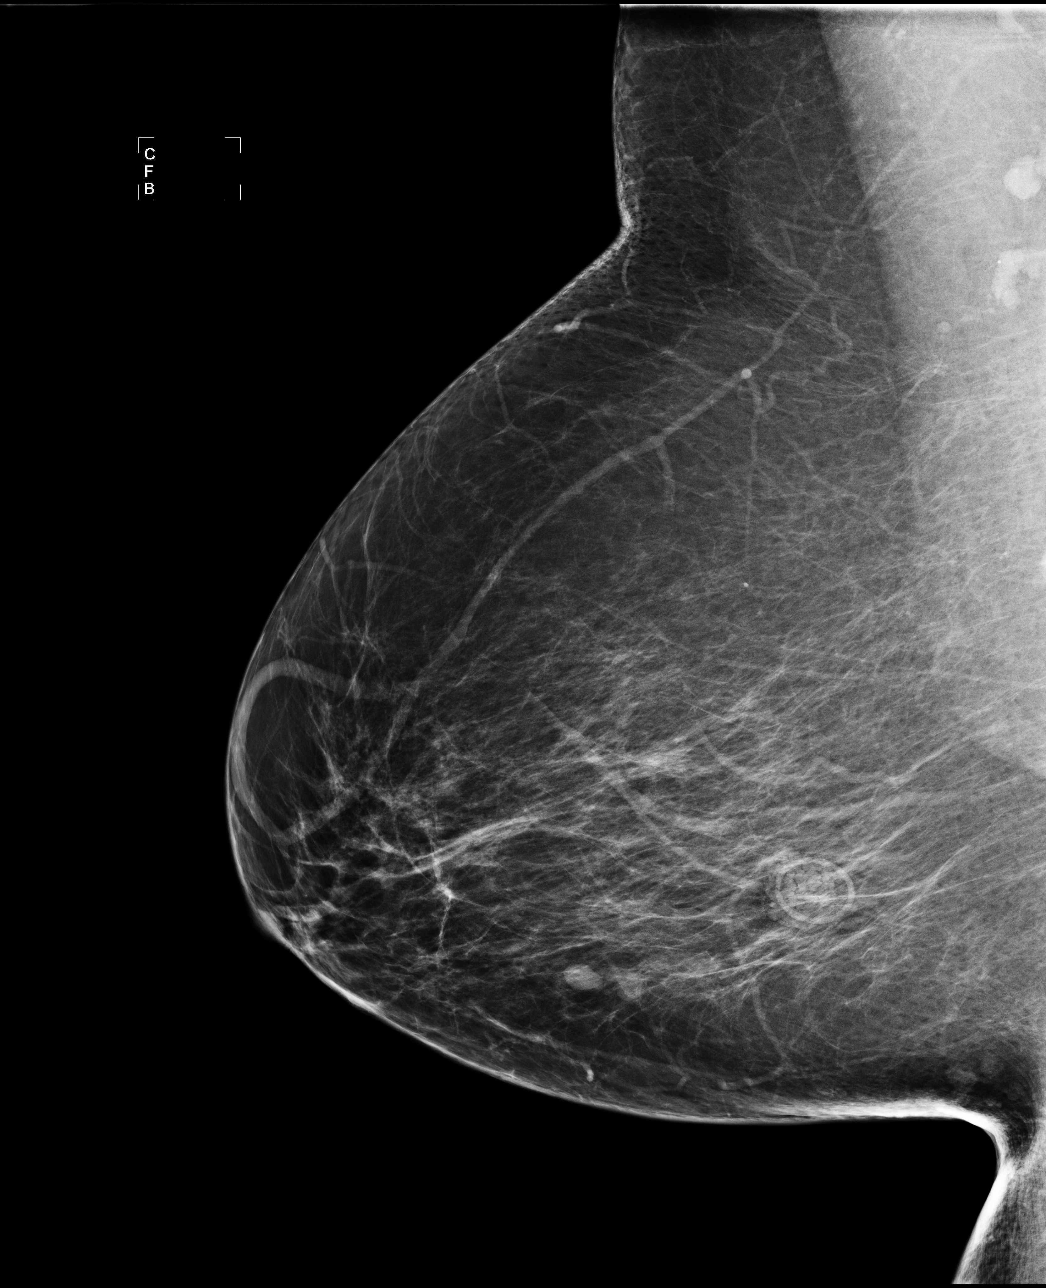

[4 of 4 positions shown; findings below may reference images not displayed]

FINDINGS: There are scattered fibroglandular densities. No
suspicious masses, architectural distortion, or calcifications are
present.

Images were processed with CAD.
IMPRESSION: No mammographic evidence of malignancy.

A result letter of this screening mammogram will be mailed directly
to the patient.

RECOMMENDATION:
Screening mammogram in one year. (Code:[XG])

BI-RADS CATEGORY 1:  Negative.

## 2012-04-21 ENCOUNTER — Ambulatory Visit (INDEPENDENT_AMBULATORY_CARE_PROVIDER_SITE_OTHER): Payer: Medicare Other

## 2012-04-21 DIAGNOSIS — J45909 Unspecified asthma, uncomplicated: Secondary | ICD-10-CM

## 2012-04-21 MED ORDER — OMALIZUMAB 150 MG ~~LOC~~ SOLR
300.0000 mg | Freq: Once | SUBCUTANEOUS | Status: AC
  Administered 2012-04-21: 300 mg via SUBCUTANEOUS

## 2012-05-01 ENCOUNTER — Telehealth: Payer: Self-pay | Admitting: Internal Medicine

## 2012-05-01 MED ORDER — ALBUTEROL SULFATE HFA 108 (90 BASE) MCG/ACT IN AERS: 2.0000 | INHALATION_SPRAY | Freq: Four times a day (QID) | RESPIRATORY_TRACT | Status: DC | PRN

## 2012-05-01 NOTE — Telephone Encounter (Signed)
Spoke with pt rx for ventolin sent to pharmacy nothing further needed.

## 2012-05-05 ENCOUNTER — Ambulatory Visit (INDEPENDENT_AMBULATORY_CARE_PROVIDER_SITE_OTHER): Payer: Medicare Other

## 2012-05-05 DIAGNOSIS — J45909 Unspecified asthma, uncomplicated: Secondary | ICD-10-CM

## 2012-05-08 ENCOUNTER — Other Ambulatory Visit: Payer: Self-pay | Admitting: Internal Medicine

## 2012-05-08 MED ORDER — FLUTICASONE-SALMETEROL 250-50 MCG/DOSE IN AEPB: INHALATION_SPRAY | RESPIRATORY_TRACT | Status: DC

## 2012-05-08 NOTE — Telephone Encounter (Signed)
RX refilled  

## 2012-05-19 ENCOUNTER — Ambulatory Visit (INDEPENDENT_AMBULATORY_CARE_PROVIDER_SITE_OTHER): Payer: Medicare Other

## 2012-05-19 DIAGNOSIS — J45909 Unspecified asthma, uncomplicated: Secondary | ICD-10-CM

## 2012-05-19 MED ORDER — OMALIZUMAB 150 MG ~~LOC~~ SOLR
300.0000 mg | Freq: Once | SUBCUTANEOUS | Status: AC
  Administered 2012-05-19: 300 mg via SUBCUTANEOUS

## 2012-05-23 MED ORDER — OMALIZUMAB 150 MG ~~LOC~~ SOLR
300.0000 mg | Freq: Once | SUBCUTANEOUS | Status: AC
  Administered 2012-05-23: 300 mg via SUBCUTANEOUS

## 2012-06-02 ENCOUNTER — Ambulatory Visit: Payer: Medicare Other

## 2012-06-15 ENCOUNTER — Ambulatory Visit (INDEPENDENT_AMBULATORY_CARE_PROVIDER_SITE_OTHER): Payer: Medicare Other

## 2012-06-15 DIAGNOSIS — J45909 Unspecified asthma, uncomplicated: Secondary | ICD-10-CM

## 2012-06-15 MED ORDER — LEVALBUTEROL HCL 0.63 MG/3ML IN NEBU
0.6300 mg | INHALATION_SOLUTION | Freq: Once | RESPIRATORY_TRACT | Status: AC
  Administered 2012-06-15: 0.63 mg via RESPIRATORY_TRACT

## 2012-06-15 MED ORDER — OMALIZUMAB 150 MG ~~LOC~~ SOLR
300.0000 mg | Freq: Once | SUBCUTANEOUS | Status: AC
  Administered 2012-06-15: 300 mg via SUBCUTANEOUS

## 2012-06-15 NOTE — Progress Notes (Signed)
Ok per Cy to give pt a  xopenex neb  treatment.pt hasnt had her xolair injection for 1mt due  To waiting on insurance approval.

## 2012-06-29 ENCOUNTER — Ambulatory Visit: Payer: Medicare Other

## 2012-06-29 ENCOUNTER — Ambulatory Visit (INDEPENDENT_AMBULATORY_CARE_PROVIDER_SITE_OTHER): Payer: Medicare Other

## 2012-06-29 DIAGNOSIS — J45909 Unspecified asthma, uncomplicated: Secondary | ICD-10-CM

## 2012-06-30 ENCOUNTER — Ambulatory Visit: Payer: Medicare Other

## 2012-06-30 MED ORDER — OMALIZUMAB 150 MG ~~LOC~~ SOLR
300.0000 mg | Freq: Once | SUBCUTANEOUS | Status: AC
  Administered 2012-06-30: 300 mg via SUBCUTANEOUS

## 2012-07-13 ENCOUNTER — Ambulatory Visit (INDEPENDENT_AMBULATORY_CARE_PROVIDER_SITE_OTHER): Payer: Medicare Other

## 2012-07-13 DIAGNOSIS — J45909 Unspecified asthma, uncomplicated: Secondary | ICD-10-CM

## 2012-07-13 MED ORDER — OMALIZUMAB 150 MG ~~LOC~~ SOLR
300.0000 mg | Freq: Once | SUBCUTANEOUS | Status: AC
  Administered 2012-07-13: 300 mg via SUBCUTANEOUS

## 2012-07-14 ENCOUNTER — Ambulatory Visit: Payer: Medicare Other

## 2012-07-27 ENCOUNTER — Ambulatory Visit (INDEPENDENT_AMBULATORY_CARE_PROVIDER_SITE_OTHER): Payer: Medicare Other

## 2012-07-27 MED ORDER — OMALIZUMAB 150 MG ~~LOC~~ SOLR
300.0000 mg | Freq: Once | SUBCUTANEOUS | Status: AC
  Administered 2012-07-27: 300 mg via SUBCUTANEOUS

## 2012-07-28 ENCOUNTER — Ambulatory Visit: Payer: Medicare Other | Admitting: Internal Medicine

## 2012-07-31 ENCOUNTER — Telehealth: Payer: Self-pay | Admitting: Internal Medicine

## 2012-07-31 NOTE — Telephone Encounter (Signed)
I spoke with pt and has already scheduled appt. Nothing further was needed

## 2012-07-31 NOTE — Telephone Encounter (Signed)
Dr. Maple Hudson had a cancellation so I called Ms. Welby. She is on his schedule for this Fri 3-14. Nothing further needed and I will close this encounter. Laura Mendez

## 2012-08-04 ENCOUNTER — Ambulatory Visit (INDEPENDENT_AMBULATORY_CARE_PROVIDER_SITE_OTHER): Payer: Medicare Other | Admitting: Internal Medicine

## 2012-08-04 ENCOUNTER — Encounter: Payer: Self-pay | Admitting: Internal Medicine

## 2012-08-04 VITALS — BP 138/82 | HR 81 | Ht 70.0 in | Wt 210.4 lb

## 2012-08-04 DIAGNOSIS — J449 Chronic obstructive pulmonary disease, unspecified: Secondary | ICD-10-CM

## 2012-08-04 DIAGNOSIS — J302 Other seasonal allergic rhinitis: Secondary | ICD-10-CM

## 2012-08-04 NOTE — Patient Instructions (Addendum)
We can continue Xolair  Ok to retry Zyrtec or another antihistamine like claritin/ loratadine or Allegra/ fexofenadine as needed.

## 2012-08-04 NOTE — Assessment & Plan Note (Signed)
Zyrtec aggravated dry skin during winter, leading to winter itch. Discussed alternatives. Will stay off until needed.

## 2012-08-04 NOTE — Progress Notes (Signed)
Patient ID: Laura Mendez, female    DOB: Mar 22, 1941, 72 y.o.   MRN: [id number]  HPI 02/03/11- 73 yoF former smoker followed for Severe asthma/ COPD FEV1 47%, Xolair Last here July 29, 2010 Reports doing fine. Wants flu shot.  She remains pleased with the difference she credits to Xolair. She stayed in this summer to avoid heat. Noticing some early fall nasal congestion. Has needed to use her rescue inhaler a couple of times and to take Zyrtec, blaming fall season. 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  08/02/11-  40 yoF former smoker followed for severe asthma/ COPD FEV1 47%, Xolair Very complimentary of our office managers assistance with some administrative problems with her Xolair. She is very strongly convinced that Xolair keeps her breathing problems under control and describes increasing chest congestion when she had be off for a couple of doses. Got through the winter with a few minor colds which she show cough. Now walking one hour per day. Prefers Ventolin rescue inhaler over other brands she has tried. Recent mild nasal stuffiness despite Xolair. Occasional rhinorrhea is helped by Zyrtec.  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 Plans flu vaccine later  08/04/12- 71 yoF former smoker followed for severe asthma/ COPD FEV1 47%, Xolair FOLLOWS FOR: still on Xolair; Denies SOB, wheezing, cough, or congestion. Very good winter. Realizes value of avoiding sick contacts.  Had bad dry-skin trunk itching during winter, relieved by stopping Zyrtec. Now a little seasonal nasal drip.  Review of Systems-see HPI Constitutional:   No-   weight loss, night sweats, fevers, chills, fatigue, lassitude. HEENT:   No-  headaches, difficulty swallowing, tooth/dental problems, sore throat,       No-  sneezing, itching,  ear ache,  nasal congestion, +post nasal drip,  CV:  No-   chest pain, orthopnea, PND, swelling in lower extremities, anasarca, dizziness, palpitations Resp:  +Some shortness of breath with exertion or at rest- unchanged             No-   productive cough,  No non-productive cough,  No-  coughing up of blood.              No-   change in color of mucus.  No- wheezing.   Skin: No-   rash or lesions. GI:  No-   heartburn, indigestion, abdominal pain, nausea, vomiting, GU:  MS:  No-   joint pain or swelling.   Neuro- nothing unusual  Psych:  No- change in mood or affect. No depression or anxiety.  No memory loss  Objective:     General- Alert, Oriented, Affect-appropriate, Distress- none acute, cheerful Skin- rash-none, lesions- none, excoriation- none. Skin is dry. Lymphadenopathy- none Head- atraumatic            Eyes- Gross vision intact, PERRLA, conjunctivae clear secretions            Ears- Hearing, canals normal            Nose- Mild turbinate edema, No-Septal dev, mucus, polyps, erosion, perforation             Throat- Mallampati II , mucosa clear , drainage- none, tonsils- atrophic Neck- flexible , trachea midline, no stridor , thyroid nl, carotid no bruit Chest - symmetrical excursion , unlabored  Heart/CV- RRR , no murmur , no gallop  , no rub, nl s1 s2                           - JVD- none , edema- none, stasis changes- none, varices- none           Lung- clear to P&A- distant, wheeze- none, cough- none , dullness-none, rub- none           Chest wall-  Abd-  Br/ Gen/ Rectal- Not done, not indicated Extrem- cyanosis- none, clubbing, none, atrophy- none, strength- nl Neuro- grossly intact to observation

## 2012-08-04 NOTE — Assessment & Plan Note (Signed)
Controlled with present meds, especially xolair. Options reviewed. No changes necessary.

## 2012-08-10 ENCOUNTER — Ambulatory Visit (INDEPENDENT_AMBULATORY_CARE_PROVIDER_SITE_OTHER): Payer: Medicare Other

## 2012-08-11 MED ORDER — OMALIZUMAB 150 MG ~~LOC~~ SOLR
300.0000 mg | Freq: Once | SUBCUTANEOUS | Status: AC
  Administered 2012-08-11: 300 mg via SUBCUTANEOUS

## 2012-08-24 ENCOUNTER — Ambulatory Visit (INDEPENDENT_AMBULATORY_CARE_PROVIDER_SITE_OTHER): Payer: Medicare Other

## 2012-08-24 DIAGNOSIS — J45909 Unspecified asthma, uncomplicated: Secondary | ICD-10-CM

## 2012-08-24 MED ORDER — OMALIZUMAB 150 MG ~~LOC~~ SOLR
300.0000 mg | Freq: Once | SUBCUTANEOUS | Status: AC
  Administered 2012-08-24: 300 mg via SUBCUTANEOUS

## 2012-09-07 ENCOUNTER — Ambulatory Visit (INDEPENDENT_AMBULATORY_CARE_PROVIDER_SITE_OTHER): Payer: Medicare Other

## 2012-09-07 DIAGNOSIS — J45909 Unspecified asthma, uncomplicated: Secondary | ICD-10-CM

## 2012-09-11 MED ORDER — OMALIZUMAB 150 MG ~~LOC~~ SOLR
300.0000 mg | Freq: Once | SUBCUTANEOUS | Status: AC
  Administered 2012-09-11: 300 mg via SUBCUTANEOUS

## 2012-09-16 ENCOUNTER — Other Ambulatory Visit: Payer: Self-pay | Admitting: Internal Medicine

## 2012-09-21 ENCOUNTER — Ambulatory Visit (INDEPENDENT_AMBULATORY_CARE_PROVIDER_SITE_OTHER): Payer: Medicare Other

## 2012-09-21 DIAGNOSIS — J45909 Unspecified asthma, uncomplicated: Secondary | ICD-10-CM

## 2012-09-25 MED ORDER — OMALIZUMAB 150 MG ~~LOC~~ SOLR
300.0000 mg | Freq: Once | SUBCUTANEOUS | Status: AC
  Administered 2012-09-25: 300 mg via SUBCUTANEOUS

## 2012-09-26 ENCOUNTER — Other Ambulatory Visit: Payer: Self-pay | Admitting: Internal Medicine

## 2012-10-05 ENCOUNTER — Ambulatory Visit (INDEPENDENT_AMBULATORY_CARE_PROVIDER_SITE_OTHER): Payer: Medicare Other

## 2012-10-05 DIAGNOSIS — J45909 Unspecified asthma, uncomplicated: Secondary | ICD-10-CM

## 2012-10-05 MED ORDER — OMALIZUMAB 150 MG ~~LOC~~ SOLR
300.0000 mg | Freq: Once | SUBCUTANEOUS | Status: AC
  Administered 2012-10-05: 300 mg via SUBCUTANEOUS

## 2012-10-09 ENCOUNTER — Encounter: Payer: Self-pay | Admitting: Internal Medicine

## 2012-10-19 ENCOUNTER — Ambulatory Visit (INDEPENDENT_AMBULATORY_CARE_PROVIDER_SITE_OTHER): Payer: Medicare Other

## 2012-10-19 DIAGNOSIS — J45909 Unspecified asthma, uncomplicated: Secondary | ICD-10-CM

## 2012-10-20 MED ORDER — OMALIZUMAB 150 MG ~~LOC~~ SOLR
300.0000 mg | Freq: Once | SUBCUTANEOUS | Status: AC
  Administered 2012-10-20: 300 mg via SUBCUTANEOUS

## 2012-10-30 DIAGNOSIS — I672 Cerebral atherosclerosis: Secondary | ICD-10-CM | POA: Insufficient documentation

## 2012-11-02 ENCOUNTER — Ambulatory Visit (INDEPENDENT_AMBULATORY_CARE_PROVIDER_SITE_OTHER): Payer: Medicare Other

## 2012-11-02 DIAGNOSIS — J45909 Unspecified asthma, uncomplicated: Secondary | ICD-10-CM

## 2012-11-02 MED ORDER — OMALIZUMAB 150 MG ~~LOC~~ SOLR
300.0000 mg | Freq: Once | SUBCUTANEOUS | Status: AC
  Administered 2012-11-02: 300 mg via SUBCUTANEOUS

## 2012-11-16 ENCOUNTER — Ambulatory Visit (INDEPENDENT_AMBULATORY_CARE_PROVIDER_SITE_OTHER): Payer: Medicare Other

## 2012-11-16 DIAGNOSIS — J45909 Unspecified asthma, uncomplicated: Secondary | ICD-10-CM

## 2012-11-17 DIAGNOSIS — J45909 Unspecified asthma, uncomplicated: Secondary | ICD-10-CM

## 2012-11-17 MED ORDER — OMALIZUMAB 150 MG ~~LOC~~ SOLR
300.0000 mg | Freq: Once | SUBCUTANEOUS | Status: AC
  Administered 2012-11-17: 300 mg via SUBCUTANEOUS

## 2012-11-30 ENCOUNTER — Ambulatory Visit (INDEPENDENT_AMBULATORY_CARE_PROVIDER_SITE_OTHER): Payer: Medicare Other

## 2012-11-30 DIAGNOSIS — J45909 Unspecified asthma, uncomplicated: Secondary | ICD-10-CM

## 2012-12-01 MED ORDER — OMALIZUMAB 150 MG ~~LOC~~ SOLR
300.0000 mg | Freq: Once | SUBCUTANEOUS | Status: AC
  Administered 2012-12-01: 300 mg via SUBCUTANEOUS

## 2012-12-14 ENCOUNTER — Ambulatory Visit (INDEPENDENT_AMBULATORY_CARE_PROVIDER_SITE_OTHER): Payer: Medicare Other

## 2012-12-14 DIAGNOSIS — J45909 Unspecified asthma, uncomplicated: Secondary | ICD-10-CM

## 2012-12-18 MED ORDER — OMALIZUMAB 150 MG ~~LOC~~ SOLR
300.0000 mg | Freq: Once | SUBCUTANEOUS | Status: AC
  Administered 2012-12-18: 300 mg via SUBCUTANEOUS

## 2012-12-28 ENCOUNTER — Ambulatory Visit (INDEPENDENT_AMBULATORY_CARE_PROVIDER_SITE_OTHER): Payer: Medicare Other

## 2012-12-28 DIAGNOSIS — J45909 Unspecified asthma, uncomplicated: Secondary | ICD-10-CM

## 2012-12-28 MED ORDER — OMALIZUMAB 150 MG ~~LOC~~ SOLR
300.0000 mg | Freq: Once | SUBCUTANEOUS | Status: AC
  Administered 2012-12-28: 300 mg via SUBCUTANEOUS

## 2013-01-11 ENCOUNTER — Ambulatory Visit (INDEPENDENT_AMBULATORY_CARE_PROVIDER_SITE_OTHER): Payer: Medicare Other

## 2013-01-11 DIAGNOSIS — J45909 Unspecified asthma, uncomplicated: Secondary | ICD-10-CM

## 2013-01-16 MED ORDER — OMALIZUMAB 150 MG ~~LOC~~ SOLR
300.0000 mg | Freq: Once | SUBCUTANEOUS | Status: AC
  Administered 2013-01-16: 300 mg via SUBCUTANEOUS

## 2013-01-25 ENCOUNTER — Ambulatory Visit (INDEPENDENT_AMBULATORY_CARE_PROVIDER_SITE_OTHER): Payer: Medicare Other

## 2013-01-25 DIAGNOSIS — J45909 Unspecified asthma, uncomplicated: Secondary | ICD-10-CM

## 2013-01-26 MED ORDER — OMALIZUMAB 150 MG ~~LOC~~ SOLR
300.0000 mg | Freq: Once | SUBCUTANEOUS | Status: AC
  Administered 2013-01-26: 300 mg via SUBCUTANEOUS

## 2013-02-08 ENCOUNTER — Ambulatory Visit (INDEPENDENT_AMBULATORY_CARE_PROVIDER_SITE_OTHER): Payer: Medicare Other

## 2013-02-08 DIAGNOSIS — J45909 Unspecified asthma, uncomplicated: Secondary | ICD-10-CM

## 2013-02-08 MED ORDER — OMALIZUMAB 150 MG ~~LOC~~ SOLR
300.0000 mg | Freq: Once | SUBCUTANEOUS | Status: AC
  Administered 2013-02-08: 300 mg via SUBCUTANEOUS

## 2013-02-09 ENCOUNTER — Ambulatory Visit (INDEPENDENT_AMBULATORY_CARE_PROVIDER_SITE_OTHER): Payer: Medicare Other | Admitting: Internal Medicine

## 2013-02-09 ENCOUNTER — Encounter: Payer: Self-pay | Admitting: Internal Medicine

## 2013-02-09 ENCOUNTER — Ambulatory Visit (INDEPENDENT_AMBULATORY_CARE_PROVIDER_SITE_OTHER)
Admission: RE | Admit: 2013-02-09 | Discharge: 2013-02-09 | Disposition: A | Discharge status: Home / Self Care | Payer: Medicare Other | Source: Ambulatory Visit | Attending: Internal Medicine | Admitting: Internal Medicine

## 2013-02-09 VITALS — BP 114/70 | HR 68 | Ht 70.0 in | Wt 204.6 lb

## 2013-02-09 DIAGNOSIS — J449 Chronic obstructive pulmonary disease, unspecified: Secondary | ICD-10-CM

## 2013-02-09 DIAGNOSIS — J309 Allergic rhinitis, unspecified: Secondary | ICD-10-CM

## 2013-02-09 DIAGNOSIS — J302 Other seasonal allergic rhinitis: Secondary | ICD-10-CM

## 2013-02-09 DIAGNOSIS — J4489 Other specified chronic obstructive pulmonary disease: Secondary | ICD-10-CM

## 2013-02-09 DIAGNOSIS — Z23 Encounter for immunization: Secondary | ICD-10-CM

## 2013-02-09 IMAGING — CR DG CHEST 2V
2 series · 2 of 2 positions shown · non-contrast
Comparison: [DATE]; [DATE]; chest CT - [DATE]

CLINICAL DATA: Asthma and COPD, initial encounter.

CHEST - 2 VIEW

[view not recorded (1 of 2)]
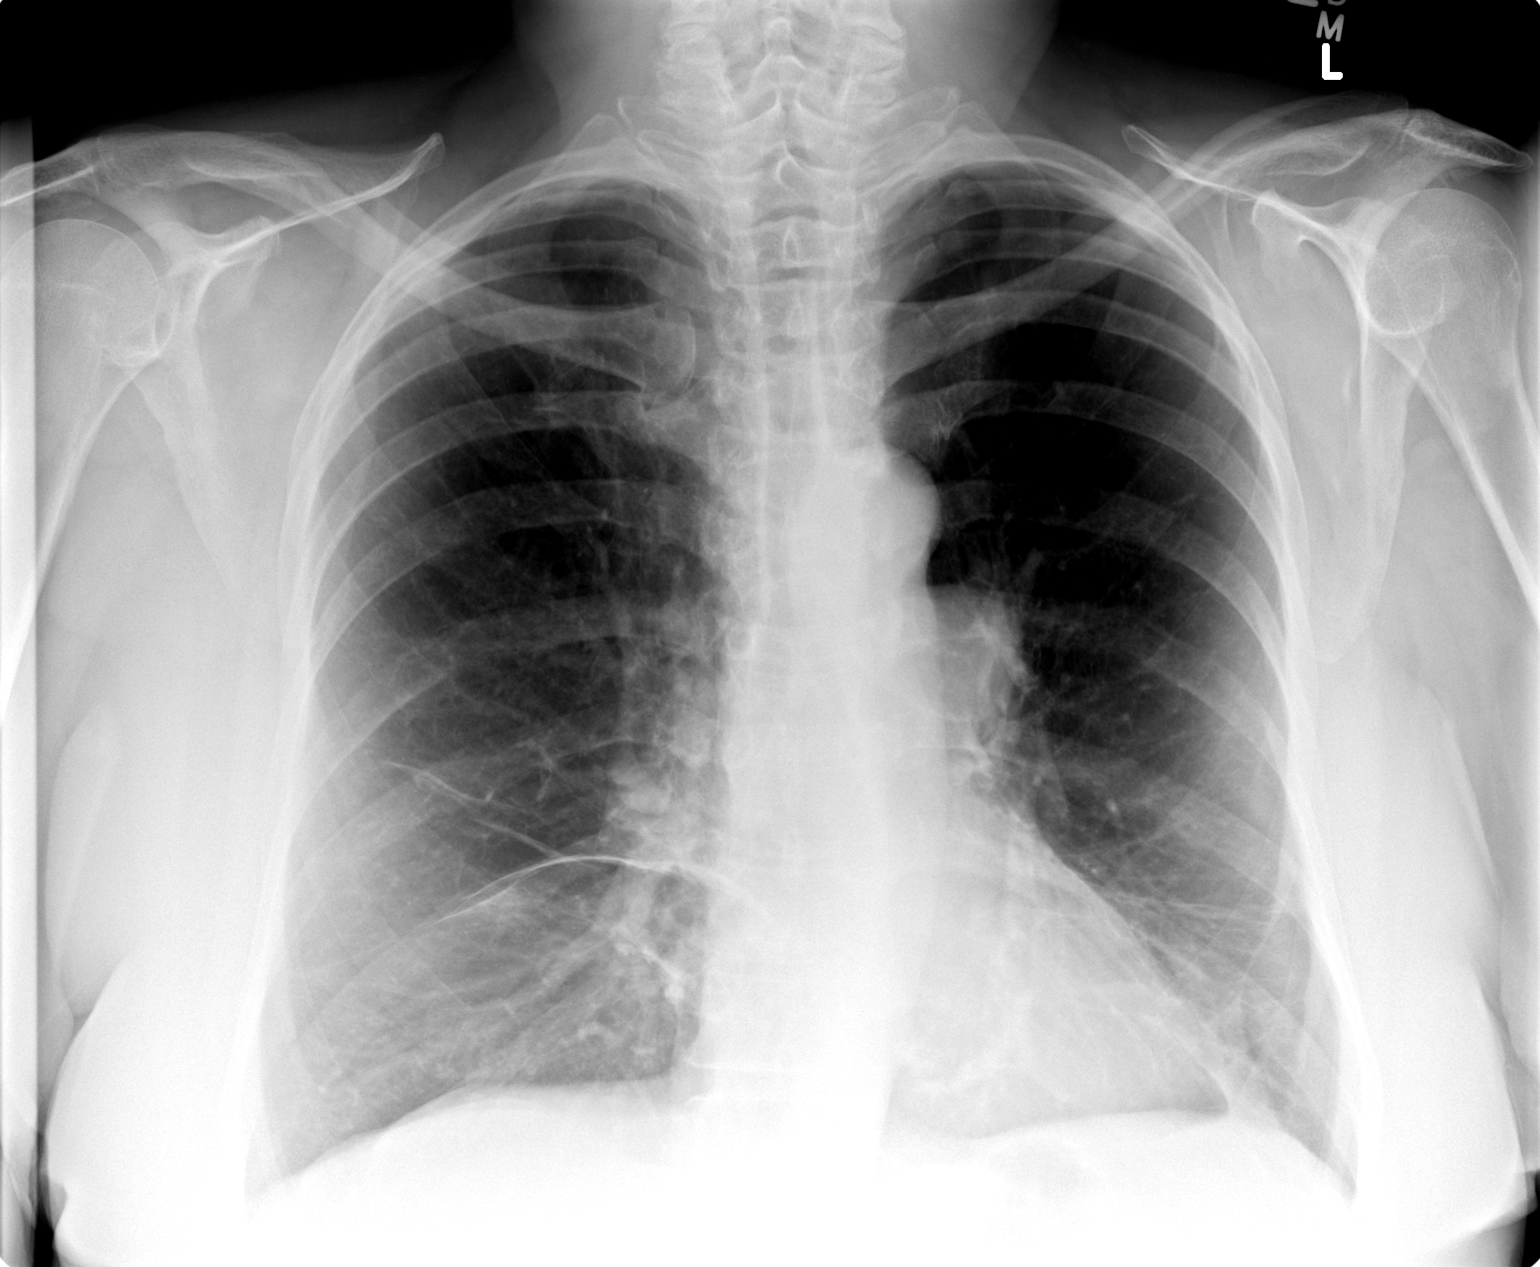

[view not recorded (2 of 2)]
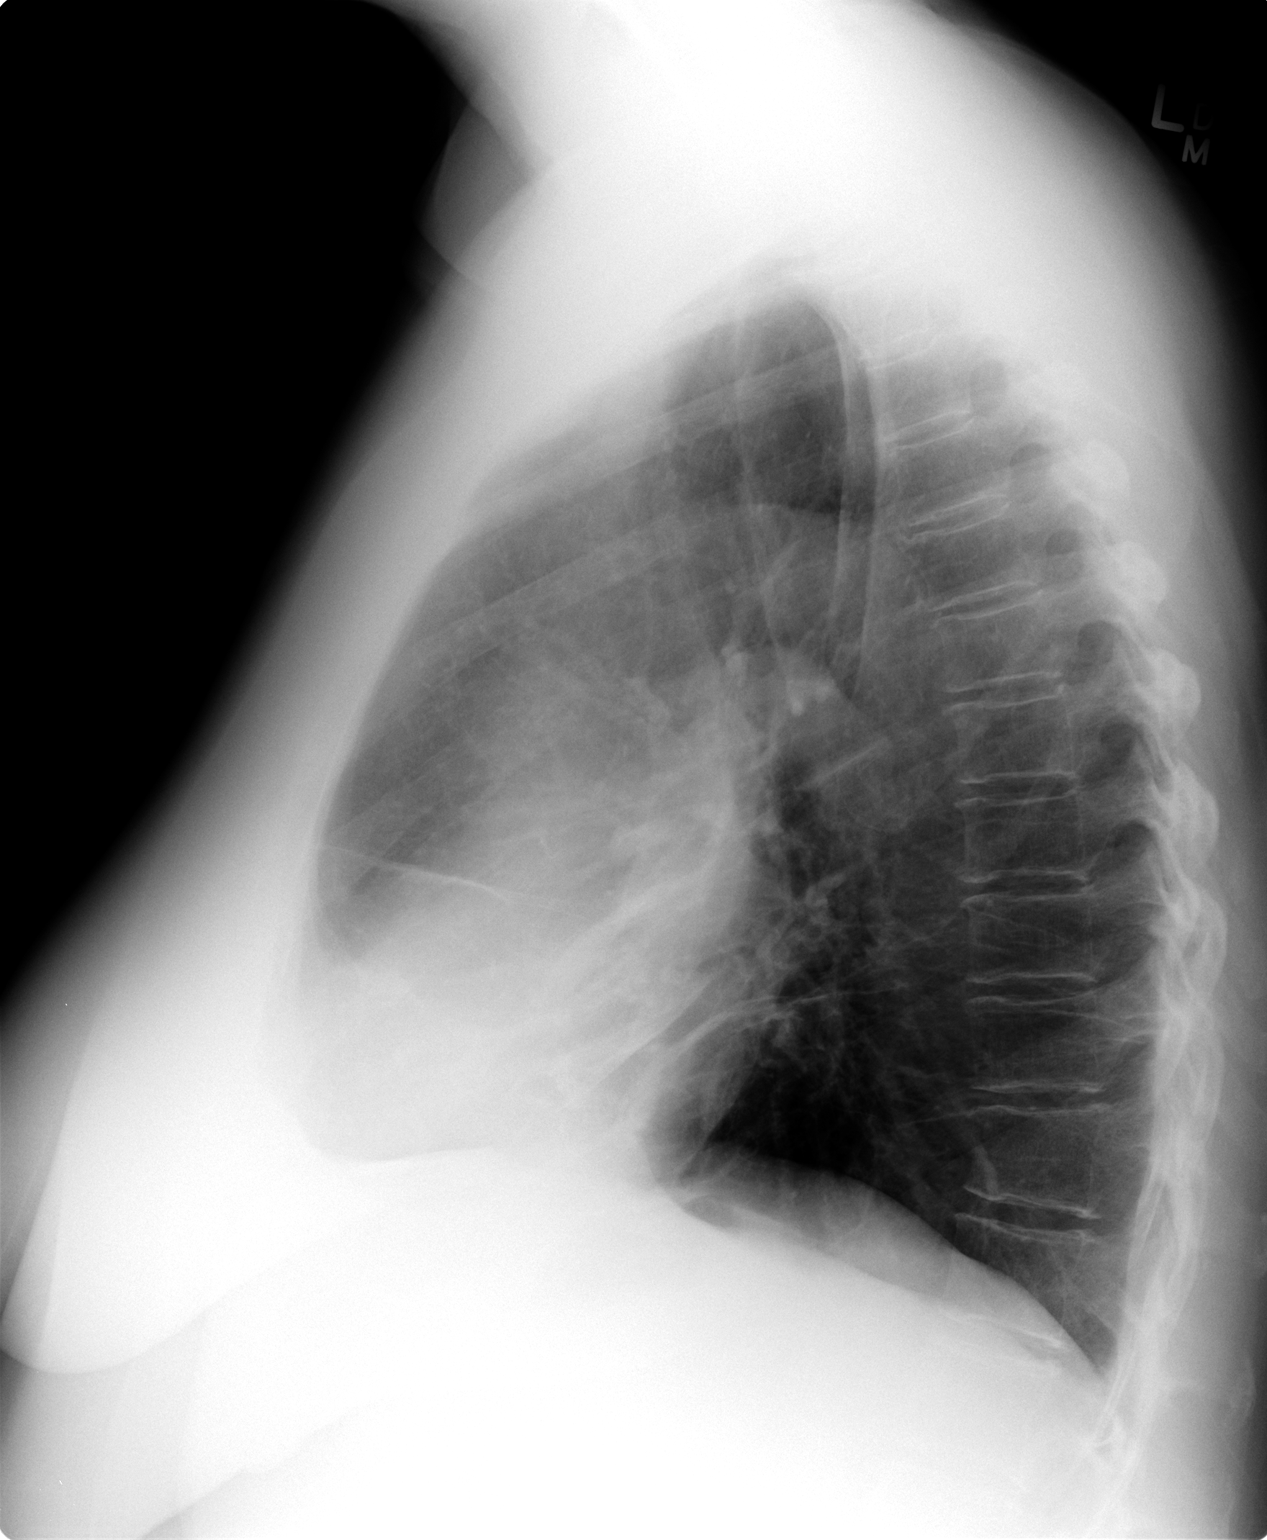

[2 of 2 positions shown; findings below may reference images not displayed]

FINDINGS: Grossly unchanged cardiac silhouette and mediastinal contours with
prominence of the central pulmonary vasculature, left greater than
right.  The lungs again appear hyperexpanded with flattening of the
bilateral hemidiaphragms.  Bibasilar linear subsegmental
atelectasis are grossly unchanged.  No new focal airspace opacity.
No pleural effusion or pneumothorax.  No definite evidence of
edema.  Grossly unchanged bones including mild (<25%) compression
deformity involving the superior endplate of the approximate T10
vertebral body.
IMPRESSION: 1.  No acute cardiopulmonary disease.
2.  Grossly unchanged findings of lung hyperexpansion, bronchitic
change and bibasilar subsegmental atelectasis/scar.
3.  Unchanged prominence of the central pulmonary vasculature as
demonstrated on remote chest CT performed [DATE].

## 2013-02-09 NOTE — Patient Instructions (Signed)
Order- CXR  Dx asthma with COPD  Flu vax  We can continue Xolair

## 2013-02-09 NOTE — Assessment & Plan Note (Addendum)
Continues great control with Xolair- discussed. No changes indicated. Meds reviewed. Flu vax, CXR

## 2013-02-09 NOTE — Progress Notes (Signed)
Patient ID: Laura Mendez, female    DOB: 28-Jul-1940, 72 y.o.   MRN: [id number]  HPI 02/03/11- 10 yoF former smoker followed for Severe asthma/ COPD FEV1 47%, Xolair Last here July 29, 2010 Reports doing fine. Wants flu shot.  She remains pleased with the difference she credits to Xolair. She stayed in this summer to avoid heat. Noticing some early fall nasal congestion. Has needed to use her rescue inhaler a couple of times and to take Zyrtec, blaming fall season. 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  08/02/11-  34 yoF former smoker followed for severe asthma/ COPD FEV1 47%, Xolair Very complimentary of our office managers assistance with some administrative problems with her Xolair. She is very strongly convinced that Xolair keeps her breathing problems under control and describes increasing chest congestion when she had be off for a couple of doses. Got through the winter with a few minor colds which she show cough. Now walking one hour per day. Prefers Ventolin rescue inhaler over other brands she has tried. Recent mild nasal stuffiness despite Xolair. Occasional rhinorrhea is helped by Zyrtec.  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 Plans flu vaccine later  08/04/12- 71 yoF former smoker followed for severe asthma/ COPD FEV1 47%, Xolair FOLLOWS FOR: still on Xolair; Denies SOB, wheezing, cough, or congestion. Very good winter. Realizes value of avoiding sick contacts.  Had bad dry-skin trunk itching during winter, relieved by stopping Zyrtec. Now a little seasonal nasal drip.  02/09/13- 18 yoF former smoker followed for severe asthma/ COPD FEV1 47%, Xolair FOLLOWS FOR: continues Xolair and doing great with it; no flare ups at this time Discussed on-going Xolair therapy. Uses Advair 1xday, occ rescue  HFA Discussed flu vax.  Review of Systems-see HPI Constitutional:   No-   weight loss, night sweats, fevers, chills, fatigue, lassitude. HEENT:   No-  headaches, difficulty swallowing, tooth/dental problems, sore throat,       No-  sneezing, itching, ear ache,  nasal congestion, +post nasal drip,  CV:  No-   chest pain, orthopnea, PND, swelling in lower extremities, anasarca, dizziness, palpitations Resp:  +Some shortness of breath with exertion or at rest- unchanged             No-   productive cough,  No non-productive cough,  No-  coughing up of blood.              No-   change in color of mucus.  No- wheezing.   Skin: No-   rash or lesions. GI:  No-   heartburn, indigestion, abdominal pain, nausea, vomiting, GU:  MS:  No-   joint pain or swelling.   Neuro- nothing unusual  Psych:  No- change in mood or affect. No depression or anxiety.  No memory loss  Objective:     General- Alert, Oriented, Affect-appropriate, Distress- none acute, cheerful Skin- rash-none, lesions- none, excoriation- none. Skin is dry. Lymphadenopathy- none Head- atraumatic            Eyes- Gross vision intact, PERRLA, conjunctivae clear secretions            Ears- Hearing, canals normal            Nose- Mild turbinate edema, No-Septal dev, mucus, polyps, erosion, perforation  Throat- Mallampati II , mucosa clear , drainage- none, tonsils- atrophic Neck- flexible , trachea midline, no stridor , thyroid nl, carotid no bruit Chest - symmetrical excursion , unlabored           Heart/CV- RRR , no murmur , no gallop  , no rub, nl s1 s2                           - JVD- none , edema- none, stasis changes- none, varices- none           Lung- +clear to P&A- distant, wheeze- none, cough- none , dullness-none, rub- none           Chest wall-  Abd-  Br/ Gen/ Rectal- Not done, not indicated Extrem- cyanosis- none, clubbing, none, atrophy- none, strength- nl Neuro- grossly intact to observation

## 2013-02-09 NOTE — Assessment & Plan Note (Signed)
Mild seasonal rhinitis

## 2013-02-12 NOTE — Progress Notes (Signed)
Quick Note:  Informed pt of CXR results and she verbalized understanding and has no further questions or concerns at this time ______

## 2013-02-22 ENCOUNTER — Ambulatory Visit (INDEPENDENT_AMBULATORY_CARE_PROVIDER_SITE_OTHER): Payer: Medicare Other

## 2013-02-22 DIAGNOSIS — J449 Chronic obstructive pulmonary disease, unspecified: Secondary | ICD-10-CM

## 2013-02-22 MED ORDER — OMALIZUMAB 150 MG ~~LOC~~ SOLR
300.0000 mg | Freq: Once | SUBCUTANEOUS | Status: AC
  Administered 2013-02-22: 300 mg via SUBCUTANEOUS

## 2013-03-08 ENCOUNTER — Ambulatory Visit (INDEPENDENT_AMBULATORY_CARE_PROVIDER_SITE_OTHER): Payer: Medicare Other

## 2013-03-08 DIAGNOSIS — J449 Chronic obstructive pulmonary disease, unspecified: Secondary | ICD-10-CM

## 2013-03-08 MED ORDER — OMALIZUMAB 150 MG ~~LOC~~ SOLR
300.0000 mg | Freq: Once | SUBCUTANEOUS | Status: AC
  Administered 2013-03-08: 300 mg via SUBCUTANEOUS

## 2013-03-15 ENCOUNTER — Other Ambulatory Visit: Payer: Self-pay

## 2013-03-15 DIAGNOSIS — Z1231 Encounter for screening mammogram for malignant neoplasm of breast: Secondary | ICD-10-CM

## 2013-03-22 ENCOUNTER — Ambulatory Visit (INDEPENDENT_AMBULATORY_CARE_PROVIDER_SITE_OTHER): Payer: Medicare Other

## 2013-03-22 DIAGNOSIS — J449 Chronic obstructive pulmonary disease, unspecified: Secondary | ICD-10-CM

## 2013-03-22 MED ORDER — OMALIZUMAB 150 MG ~~LOC~~ SOLR
300.0000 mg | Freq: Once | SUBCUTANEOUS | Status: AC
  Administered 2013-03-22: 300 mg via SUBCUTANEOUS

## 2013-04-05 ENCOUNTER — Ambulatory Visit (INDEPENDENT_AMBULATORY_CARE_PROVIDER_SITE_OTHER): Payer: Medicare Other

## 2013-04-05 DIAGNOSIS — J449 Chronic obstructive pulmonary disease, unspecified: Secondary | ICD-10-CM

## 2013-04-05 MED ORDER — OMALIZUMAB 150 MG ~~LOC~~ SOLR
300.0000 mg | Freq: Once | SUBCUTANEOUS | Status: AC
  Administered 2013-04-05: 300 mg via SUBCUTANEOUS

## 2013-04-18 ENCOUNTER — Ambulatory Visit
Admission: RE | Admit: 2013-04-18 | Discharge: 2013-04-18 | Disposition: A | Discharge status: Home / Self Care | Payer: Medicare Other | Source: Ambulatory Visit

## 2013-04-18 DIAGNOSIS — Z1231 Encounter for screening mammogram for malignant neoplasm of breast: Secondary | ICD-10-CM

## 2013-04-18 IMAGING — MG MM DIGITAL SCREENING BILAT W/ CAD
8 series · 8 of 24 positions shown · non-contrast
Comparison: Previous exam(s).

CLINICAL DATA: Screening.

EXAM:
DIGITAL SCREENING BILATERAL MAMMOGRAM WITH CAD
DIGITAL BREAST TOMOSYNTHESIS
Digital breast tomosynthesis images are acquired in two projections.
These images are reviewed in combination with the digital mammogram,
confirming the findings below.

[L CC]
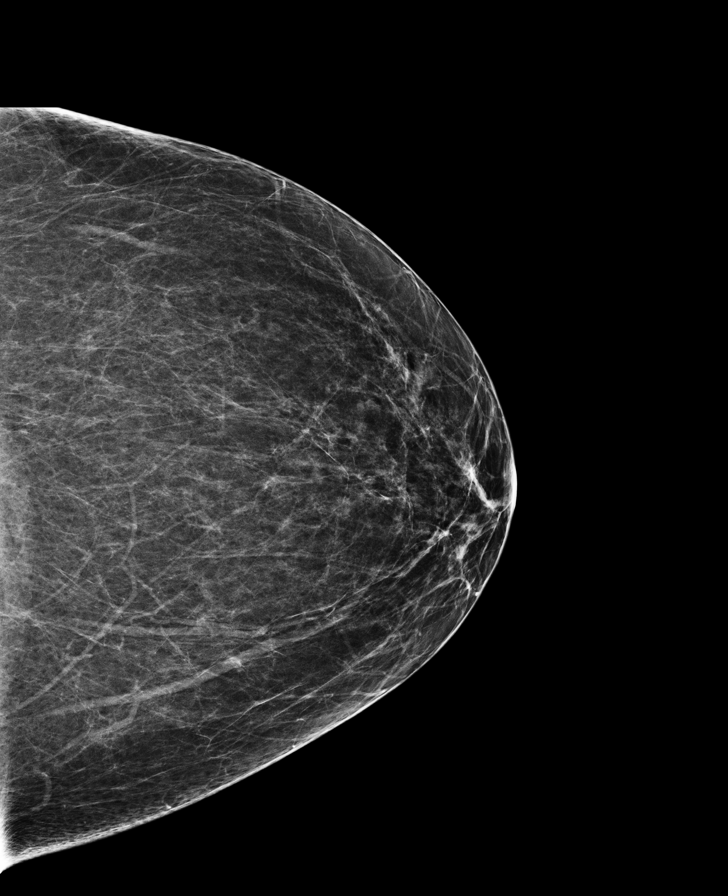

[R MLO]
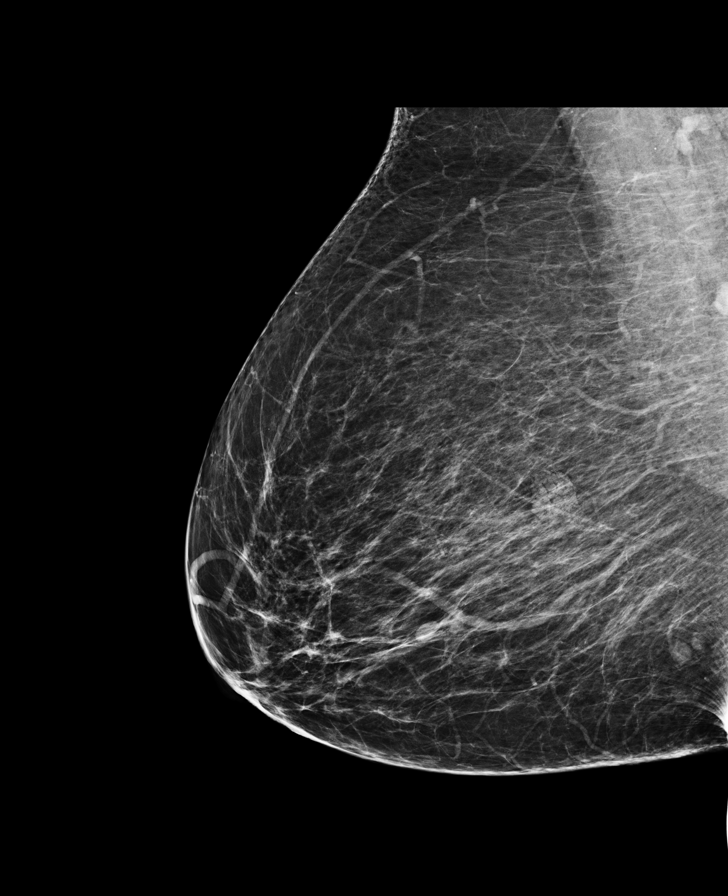

[R CC]
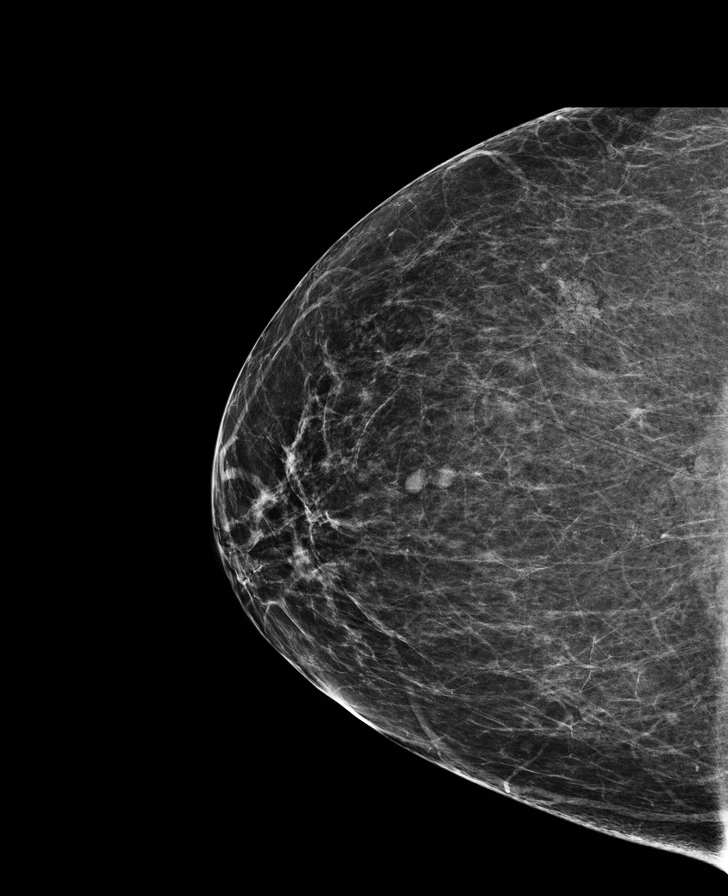

[L MLO]
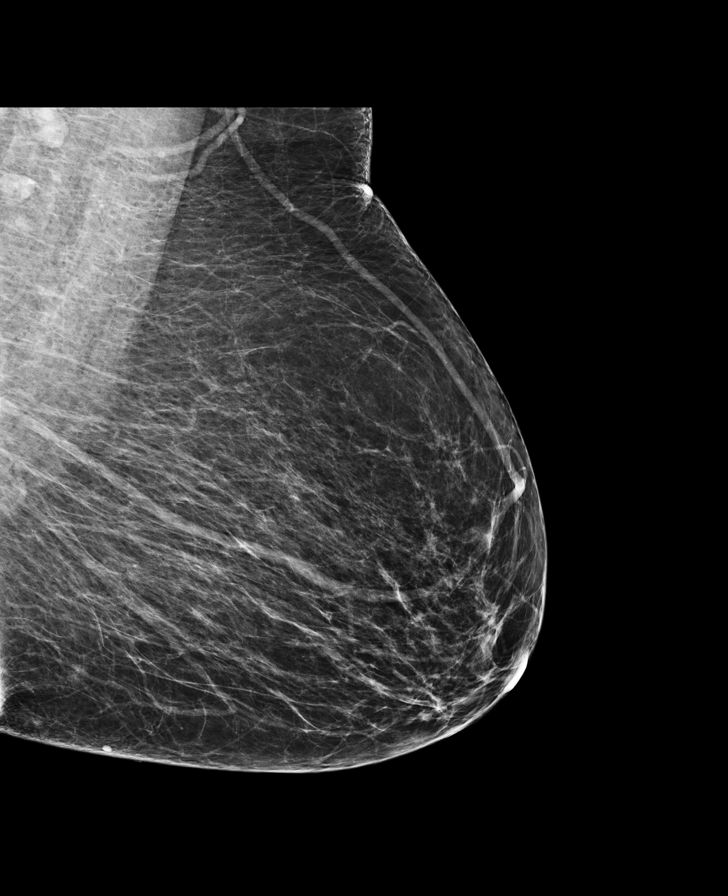

[L CC tomo · tomo slice 32/63.0]
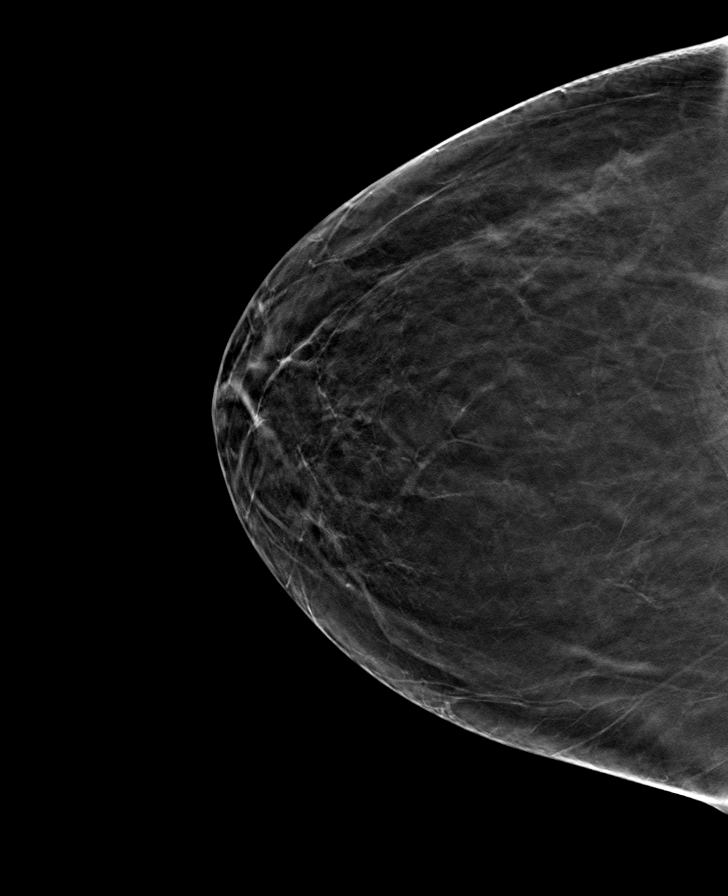

[L MLO tomo · tomo slice 37/73.0]
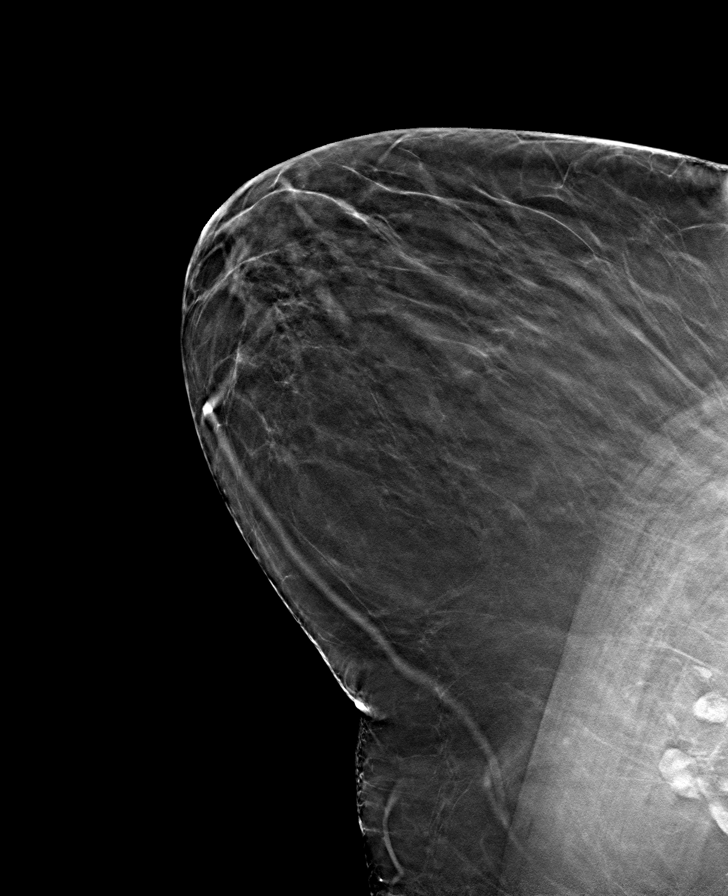

[R MLO tomo · tomo slice 37/74.0]
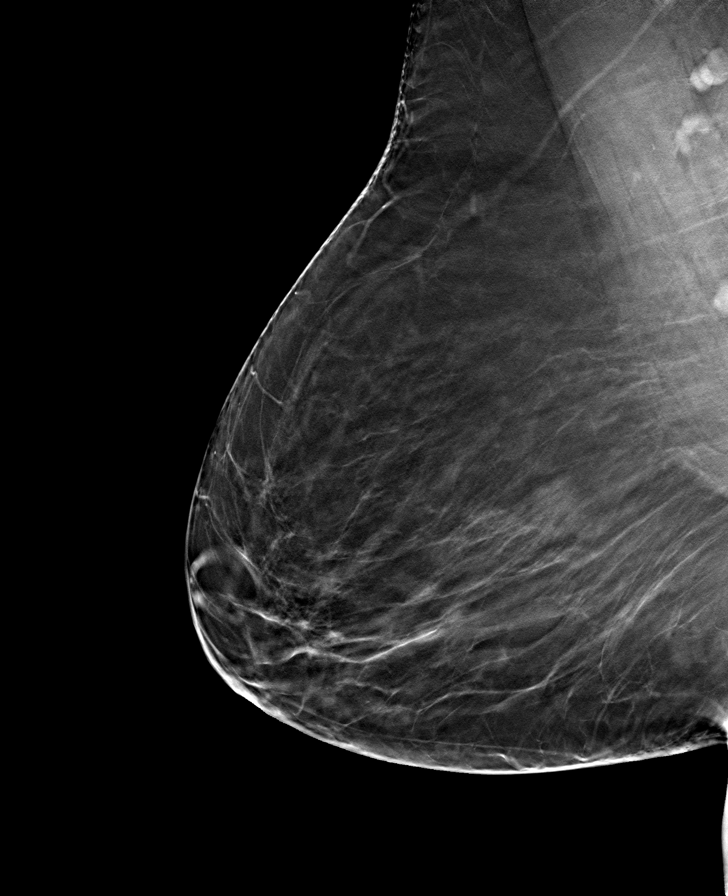

[R CC tomo · tomo slice 33/65.0]
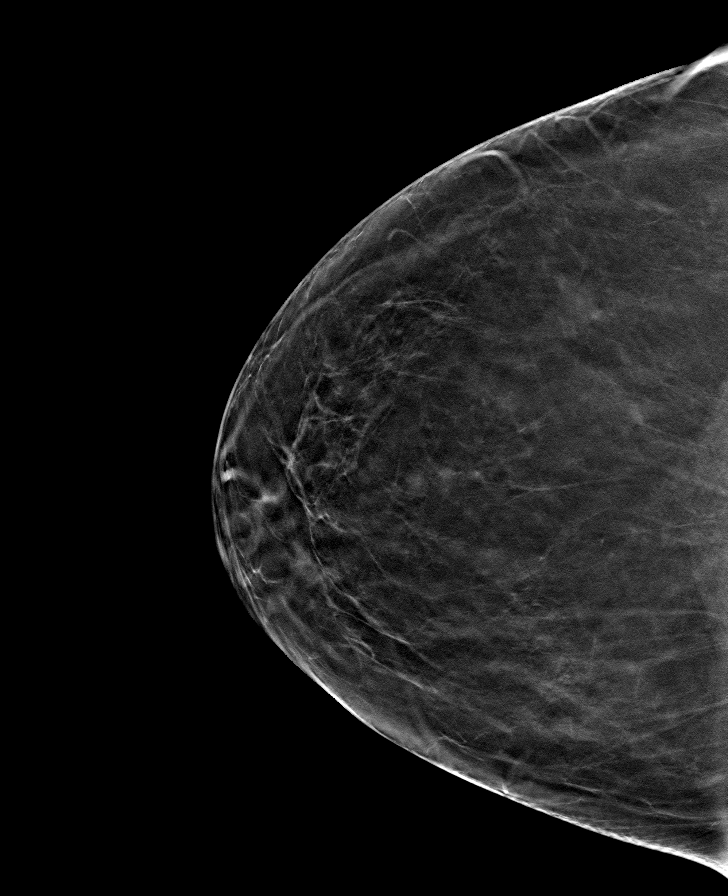

[8 of 24 positions shown; findings below may reference images not displayed]

ACR Breast Density Category b: There are scattered areas of
fibroglandular density.
FINDINGS: There are no findings suspicious for malignancy. Images were
processed with CAD.
IMPRESSION: No mammographic evidence of malignancy. A result letter of this
screening mammogram will be mailed directly to the patient.

RECOMMENDATION:
Screening mammogram in one year.(Code:[8Z])

BI-RADS CATEGORY  1: Negative

## 2013-04-20 ENCOUNTER — Ambulatory Visit (INDEPENDENT_AMBULATORY_CARE_PROVIDER_SITE_OTHER): Payer: Medicare Other

## 2013-04-20 DIAGNOSIS — J45909 Unspecified asthma, uncomplicated: Secondary | ICD-10-CM

## 2013-04-20 MED ORDER — OMALIZUMAB 150 MG ~~LOC~~ SOLR
300.0000 mg | Freq: Once | SUBCUTANEOUS | Status: AC
  Administered 2013-04-20: 300 mg via SUBCUTANEOUS

## 2013-04-27 ENCOUNTER — Encounter: Payer: Self-pay | Admitting: Internal Medicine

## 2013-05-03 ENCOUNTER — Ambulatory Visit: Payer: Medicare Other

## 2013-05-04 ENCOUNTER — Ambulatory Visit (INDEPENDENT_AMBULATORY_CARE_PROVIDER_SITE_OTHER): Payer: Medicare Other

## 2013-05-04 DIAGNOSIS — J45909 Unspecified asthma, uncomplicated: Secondary | ICD-10-CM

## 2013-05-04 MED ORDER — OMALIZUMAB 150 MG ~~LOC~~ SOLR
300.0000 mg | Freq: Once | SUBCUTANEOUS | Status: AC
  Administered 2013-05-04: 300 mg via SUBCUTANEOUS

## 2013-05-18 ENCOUNTER — Ambulatory Visit (INDEPENDENT_AMBULATORY_CARE_PROVIDER_SITE_OTHER): Payer: Medicare Other

## 2013-05-18 ENCOUNTER — Ambulatory Visit: Payer: Medicare Other

## 2013-05-18 DIAGNOSIS — J45909 Unspecified asthma, uncomplicated: Secondary | ICD-10-CM

## 2013-05-21 ENCOUNTER — Ambulatory Visit: Payer: Medicare Other

## 2013-05-21 MED ORDER — OMALIZUMAB 150 MG ~~LOC~~ SOLR
300.0000 mg | Freq: Once | SUBCUTANEOUS | Status: AC
  Administered 2013-05-21: 300 mg via SUBCUTANEOUS

## 2013-06-01 ENCOUNTER — Ambulatory Visit (INDEPENDENT_AMBULATORY_CARE_PROVIDER_SITE_OTHER): Payer: Medicare Other

## 2013-06-01 DIAGNOSIS — J45909 Unspecified asthma, uncomplicated: Secondary | ICD-10-CM

## 2013-06-04 MED ORDER — OMALIZUMAB 150 MG ~~LOC~~ SOLR
300.0000 mg | Freq: Once | SUBCUTANEOUS | Status: AC
  Administered 2013-06-04: 300 mg via SUBCUTANEOUS

## 2013-06-14 ENCOUNTER — Encounter: Payer: Self-pay | Admitting: Internal Medicine

## 2013-06-15 ENCOUNTER — Ambulatory Visit (INDEPENDENT_AMBULATORY_CARE_PROVIDER_SITE_OTHER): Payer: Medicare Other

## 2013-06-15 DIAGNOSIS — J45909 Unspecified asthma, uncomplicated: Secondary | ICD-10-CM

## 2013-06-18 ENCOUNTER — Other Ambulatory Visit: Payer: Self-pay | Admitting: Internal Medicine

## 2013-06-18 ENCOUNTER — Ambulatory Visit: Payer: Medicare Other

## 2013-06-18 MED ORDER — OMALIZUMAB 150 MG ~~LOC~~ SOLR
300.0000 mg | Freq: Once | SUBCUTANEOUS | Status: AC
  Administered 2013-06-18: 300 mg via SUBCUTANEOUS

## 2013-06-19 NOTE — Telephone Encounter (Signed)
Please advise if okay to give Advair 250/50; I only see Advair 100/50 on patients medication list. Thanks.

## 2013-06-19 NOTE — Telephone Encounter (Signed)
Ok to refill Advair 250 x 1 year

## 2013-06-29 ENCOUNTER — Ambulatory Visit (INDEPENDENT_AMBULATORY_CARE_PROVIDER_SITE_OTHER): Payer: Medicare Other

## 2013-06-29 DIAGNOSIS — J45909 Unspecified asthma, uncomplicated: Secondary | ICD-10-CM

## 2013-07-04 MED ORDER — OMALIZUMAB 150 MG ~~LOC~~ SOLR
300.0000 mg | Freq: Once | SUBCUTANEOUS | Status: AC
  Administered 2013-07-04: 300 mg via SUBCUTANEOUS

## 2013-07-13 ENCOUNTER — Ambulatory Visit (INDEPENDENT_AMBULATORY_CARE_PROVIDER_SITE_OTHER): Payer: Medicare Other

## 2013-07-13 DIAGNOSIS — J45909 Unspecified asthma, uncomplicated: Secondary | ICD-10-CM

## 2013-07-13 MED ORDER — OMALIZUMAB 150 MG ~~LOC~~ SOLR
300.0000 mg | Freq: Once | SUBCUTANEOUS | Status: AC
  Administered 2013-07-13: 300 mg via SUBCUTANEOUS

## 2013-07-27 ENCOUNTER — Ambulatory Visit (INDEPENDENT_AMBULATORY_CARE_PROVIDER_SITE_OTHER): Payer: Medicare Other

## 2013-07-27 DIAGNOSIS — J45909 Unspecified asthma, uncomplicated: Secondary | ICD-10-CM

## 2013-08-01 MED ORDER — OMALIZUMAB 150 MG ~~LOC~~ SOLR
300.0000 mg | Freq: Once | SUBCUTANEOUS | Status: AC
  Administered 2013-08-01: 300 mg via SUBCUTANEOUS

## 2013-08-09 ENCOUNTER — Ambulatory Visit: Payer: Medicare Other | Admitting: Internal Medicine

## 2013-08-10 ENCOUNTER — Ambulatory Visit (INDEPENDENT_AMBULATORY_CARE_PROVIDER_SITE_OTHER): Payer: Medicare Other

## 2013-08-10 DIAGNOSIS — J45909 Unspecified asthma, uncomplicated: Secondary | ICD-10-CM

## 2013-08-10 MED ORDER — OMALIZUMAB 150 MG ~~LOC~~ SOLR
300.0000 mg | Freq: Once | SUBCUTANEOUS | Status: AC
  Administered 2013-08-10: 300 mg via SUBCUTANEOUS

## 2013-08-27 ENCOUNTER — Ambulatory Visit (INDEPENDENT_AMBULATORY_CARE_PROVIDER_SITE_OTHER): Payer: Medicare Other

## 2013-08-27 DIAGNOSIS — J45909 Unspecified asthma, uncomplicated: Secondary | ICD-10-CM

## 2013-08-28 MED ORDER — OMALIZUMAB 150 MG ~~LOC~~ SOLR
300.0000 mg | Freq: Once | SUBCUTANEOUS | Status: AC
  Administered 2013-08-28: 300 mg via SUBCUTANEOUS

## 2013-09-07 ENCOUNTER — Ambulatory Visit: Payer: Medicare Other | Admitting: Internal Medicine

## 2013-09-12 ENCOUNTER — Ambulatory Visit (INDEPENDENT_AMBULATORY_CARE_PROVIDER_SITE_OTHER): Payer: Medicare Other

## 2013-09-12 DIAGNOSIS — J45909 Unspecified asthma, uncomplicated: Secondary | ICD-10-CM

## 2013-09-12 MED ORDER — OMALIZUMAB 150 MG ~~LOC~~ SOLR
300.0000 mg | Freq: Once | SUBCUTANEOUS | Status: AC
  Administered 2013-09-12: 300 mg via SUBCUTANEOUS

## 2013-09-28 ENCOUNTER — Ambulatory Visit: Payer: Medicare Other

## 2013-09-28 ENCOUNTER — Ambulatory Visit (INDEPENDENT_AMBULATORY_CARE_PROVIDER_SITE_OTHER): Payer: Medicare Other | Admitting: Internal Medicine

## 2013-09-28 ENCOUNTER — Encounter: Payer: Self-pay | Admitting: Internal Medicine

## 2013-09-28 VITALS — BP 140/82 | HR 72 | Ht 70.0 in | Wt 209.8 lb

## 2013-09-28 DIAGNOSIS — J302 Other seasonal allergic rhinitis: Secondary | ICD-10-CM

## 2013-09-28 DIAGNOSIS — J449 Chronic obstructive pulmonary disease, unspecified: Secondary | ICD-10-CM

## 2013-09-28 DIAGNOSIS — J45909 Unspecified asthma, uncomplicated: Secondary | ICD-10-CM

## 2013-09-28 DIAGNOSIS — J3089 Other allergic rhinitis: Secondary | ICD-10-CM

## 2013-09-28 DIAGNOSIS — J309 Allergic rhinitis, unspecified: Secondary | ICD-10-CM

## 2013-09-28 NOTE — Progress Notes (Signed)
Patient ID: Laura Mendez, female    DOB: July 22, 1940, 73 y.o.   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 Wants flu shot.  She remains pleased with the difference she credits to [place]. She stayed in this summer to avoid heat. Noticing some early fall nasal congestion. Has needed to use her rescue inhaler a couple of times and to take Zyrtec, blaming fall season. 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  08/02/11-  2 yoF former smoker followed for severe asthma/ COPD FEV1 47%, Xolair Very complimentary of our office managers assistance with some administrative problems with her Xolair. She is very strongly convinced that [place] keeps her breathing problems under control and describes increasing chest congestion when she had be off for a couple of doses. Got through the winter with a few minor colds which she show cough. Now walking one hour per day. Prefers Ventolin rescue inhaler over other brands she has tried. Recent mild nasal stuffiness despite Xolair. Occasional rhinorrhea is helped by Zyrtec.  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 Plans flu vaccine later  08/04/12- 71 yoF former smoker followed for severe asthma/ COPD FEV1 47%, Xolair FOLLOWS FOR: still on Xolair; Denies SOB, wheezing, cough, or congestion. Very good winter. Realizes value of avoiding sick contacts.  Had bad dry-skin trunk itching during winter, relieved by stopping Zyrtec. Now a little seasonal nasal drip.  02/09/13- 78 yoF former smoker followed for severe asthma/ COPD FEV1 47%, Xolair FOLLOWS FOR: continues Xolair and doing great with it; no flare ups at this time Discussed on-going Xolair therapy. Uses Advair 1xday, occ rescue  HFA Discussed flu vax.  09/28/13- 35 yoF former smoker followed for severe asthma/ COPD FEV1 47%, Xolair FOLLOWS FOR:  Doing well on Xolair injections.  No concerns today Says again that Xolair makes a "huge difference", but concerned how long she can handle the cost.  On rare occasions when she has been late by a few days, she feels chest tightening and wheeze starting to come back.No chest pain, productive cough or discolored sputum. Today feels well.  CXR 02/12/13 IMPRESSION:  1. No acute cardiopulmonary disease.  2. Grossly unchanged findings of lung hyperexpansion, bronchitic  change and bibasilar subsegmental atelectasis/scar.  3. Unchanged prominence of the central pulmonary vasculature as  demonstrated on remote chest CT performed 07/2006.  Original Report Authenticated By: Jake Seats, MD  Review of Systems-see HPI Constitutional:   No-   weight loss, night sweats, fevers, chills, fatigue, lassitude. HEENT:   No-  headaches, difficulty swallowing, tooth/dental problems, sore throat,       No-  sneezing, itching, ear ache,  nasal congestion, +post nasal drip,  CV:  No-   chest pain, orthopnea, PND, swelling in lower extremities, anasarca, dizziness, palpitations Resp:  +Some shortness of breath with exertion or at rest- unchanged             No-   productive cough,  No non-productive cough,  No-  coughing up of blood.              No-   change in color of mucus.  No- wheezing.   Skin: No-   rash or lesions. GI:  No-   heartburn, indigestion, abdominal pain, nausea, vomiting, GU:  MS:  No-   joint pain or swelling.   Neuro- nothing unusual  Psych:  No- change in mood or affect. No depression or anxiety.  No memory loss  Objective:     General- Alert, Oriented, Affect-appropriate, Distress- none acute, cheerful Skin- rash-none, lesions- none, excoriation- none. Skin is dry. Lymphadenopathy- none Head- atraumatic            Eyes- Gross vision intact, PERRLA, conjunctivae  clear secretions            Ears- Hearing, canals normal            Nose- Mild turbinate edema, No-Septal dev, mucus, polyps, erosion, perforation             Throat- Mallampati II , mucosa clear , drainage- none, tonsils- atrophic Neck- flexible , trachea midline, no stridor , thyroid nl, carotid no bruit Chest - symmetrical excursion , unlabored           Heart/CV- RRR , no murmur , no gallop  , no rub, nl s1 s2                           - JVD- none , edema- none, stasis changes- none, varices- none           Lung- +clear to P&A- distant, wheeze- none, cough- none , dullness-none, rub- none           Chest wall-  Abd-  Br/ Gen/ Rectal- Not done, not indicated Extrem- cyanosis- none, clubbing, none, atrophy- none, strength- nl Neuro- grossly intact to observation

## 2013-09-28 NOTE — Patient Instructions (Signed)
We can continue Xolair- You can experiment with whether getting your shots every 3-4 weeks would work as well and save money for you.   Continue routine meds. Call as needed

## 2013-10-01 DIAGNOSIS — J45909 Unspecified asthma, uncomplicated: Secondary | ICD-10-CM

## 2013-10-01 MED ORDER — OMALIZUMAB 150 MG ~~LOC~~ SOLR
300.0000 mg | Freq: Once | SUBCUTANEOUS | Status: AC
  Administered 2013-10-01: 300 mg via SUBCUTANEOUS

## 2013-10-05 NOTE — Assessment & Plan Note (Signed)
As compromise with cost, she is going to try increasing interval between xolair shots now that spring pollen is largely gone- every 3 weeks or month as tolerated. Discussed carefully.  Continue regular meds

## 2013-10-05 NOTE — Assessment & Plan Note (Signed)
Controlled.  

## 2013-10-12 ENCOUNTER — Other Ambulatory Visit: Payer: Self-pay | Admitting: Internal Medicine

## 2013-10-12 ENCOUNTER — Ambulatory Visit (INDEPENDENT_AMBULATORY_CARE_PROVIDER_SITE_OTHER): Payer: Medicare Other

## 2013-10-12 DIAGNOSIS — J45909 Unspecified asthma, uncomplicated: Secondary | ICD-10-CM

## 2013-10-12 MED ORDER — OMALIZUMAB 150 MG ~~LOC~~ SOLR
300.0000 mg | Freq: Once | SUBCUTANEOUS | Status: AC
  Administered 2013-10-12: 300 mg via SUBCUTANEOUS

## 2013-10-26 ENCOUNTER — Ambulatory Visit (INDEPENDENT_AMBULATORY_CARE_PROVIDER_SITE_OTHER): Payer: Medicare Other

## 2013-10-26 DIAGNOSIS — J45909 Unspecified asthma, uncomplicated: Secondary | ICD-10-CM

## 2013-10-26 MED ORDER — OMALIZUMAB 150 MG ~~LOC~~ SOLR
300.0000 mg | Freq: Once | SUBCUTANEOUS | Status: AC
  Administered 2013-10-26: 300 mg via SUBCUTANEOUS

## 2013-11-01 DIAGNOSIS — Z86010 Personal history of colonic polyps: Secondary | ICD-10-CM | POA: Insufficient documentation

## 2013-11-09 ENCOUNTER — Ambulatory Visit (INDEPENDENT_AMBULATORY_CARE_PROVIDER_SITE_OTHER): Payer: Medicare Other

## 2013-11-09 DIAGNOSIS — J45909 Unspecified asthma, uncomplicated: Secondary | ICD-10-CM

## 2013-11-09 MED ORDER — OMALIZUMAB 150 MG ~~LOC~~ SOLR
300.0000 mg | Freq: Once | SUBCUTANEOUS | Status: AC
  Administered 2013-11-09: 300 mg via SUBCUTANEOUS

## 2013-11-14 DIAGNOSIS — E041 Nontoxic single thyroid nodule: Secondary | ICD-10-CM | POA: Insufficient documentation

## 2013-11-16 ENCOUNTER — Ambulatory Visit: Payer: Medicare Other

## 2013-11-26 ENCOUNTER — Ambulatory Visit (INDEPENDENT_AMBULATORY_CARE_PROVIDER_SITE_OTHER): Payer: Medicare Other

## 2013-11-26 DIAGNOSIS — J45909 Unspecified asthma, uncomplicated: Secondary | ICD-10-CM

## 2013-11-28 MED ORDER — OMALIZUMAB 150 MG ~~LOC~~ SOLR
300.0000 mg | Freq: Once | SUBCUTANEOUS | Status: AC
  Administered 2013-11-28: 300 mg via SUBCUTANEOUS

## 2013-12-10 ENCOUNTER — Ambulatory Visit (INDEPENDENT_AMBULATORY_CARE_PROVIDER_SITE_OTHER): Payer: Medicare Other

## 2013-12-10 DIAGNOSIS — J45909 Unspecified asthma, uncomplicated: Secondary | ICD-10-CM

## 2013-12-14 MED ORDER — OMALIZUMAB 150 MG ~~LOC~~ SOLR
300.0000 mg | Freq: Once | SUBCUTANEOUS | Status: AC
  Administered 2013-12-14: 300 mg via SUBCUTANEOUS

## 2013-12-26 ENCOUNTER — Ambulatory Visit (INDEPENDENT_AMBULATORY_CARE_PROVIDER_SITE_OTHER): Payer: Medicare Other

## 2013-12-26 DIAGNOSIS — J449 Chronic obstructive pulmonary disease, unspecified: Secondary | ICD-10-CM

## 2014-01-01 MED ORDER — OMALIZUMAB 150 MG ~~LOC~~ SOLR
300.0000 mg | Freq: Once | SUBCUTANEOUS | Status: AC
  Administered 2014-01-01: 300 mg via SUBCUTANEOUS

## 2014-01-11 ENCOUNTER — Ambulatory Visit (INDEPENDENT_AMBULATORY_CARE_PROVIDER_SITE_OTHER): Payer: Medicare Other

## 2014-01-11 DIAGNOSIS — J45909 Unspecified asthma, uncomplicated: Secondary | ICD-10-CM

## 2014-01-11 MED ORDER — OMALIZUMAB 150 MG ~~LOC~~ SOLR
300.0000 mg | Freq: Once | SUBCUTANEOUS | Status: AC
  Administered 2014-01-11: 300 mg via SUBCUTANEOUS

## 2014-01-25 ENCOUNTER — Ambulatory Visit (INDEPENDENT_AMBULATORY_CARE_PROVIDER_SITE_OTHER): Payer: Medicare Other

## 2014-01-25 DIAGNOSIS — J449 Chronic obstructive pulmonary disease, unspecified: Secondary | ICD-10-CM

## 2014-01-30 MED ORDER — OMALIZUMAB 150 MG ~~LOC~~ SOLR
300.0000 mg | Freq: Once | SUBCUTANEOUS | Status: AC
  Administered 2014-01-30: 300 mg via SUBCUTANEOUS

## 2014-02-08 ENCOUNTER — Ambulatory Visit (INDEPENDENT_AMBULATORY_CARE_PROVIDER_SITE_OTHER): Payer: Medicare Other

## 2014-02-08 DIAGNOSIS — J4489 Other specified chronic obstructive pulmonary disease: Secondary | ICD-10-CM

## 2014-02-08 DIAGNOSIS — J449 Chronic obstructive pulmonary disease, unspecified: Secondary | ICD-10-CM

## 2014-02-15 MED ORDER — OMALIZUMAB 150 MG ~~LOC~~ SOLR
300.0000 mg | Freq: Once | SUBCUTANEOUS | Status: AC
  Administered 2014-02-15: 300 mg via SUBCUTANEOUS

## 2014-02-22 ENCOUNTER — Ambulatory Visit (INDEPENDENT_AMBULATORY_CARE_PROVIDER_SITE_OTHER): Payer: Medicare Other

## 2014-02-22 ENCOUNTER — Ambulatory Visit: Payer: Medicare Other

## 2014-02-22 DIAGNOSIS — Z23 Encounter for immunization: Secondary | ICD-10-CM

## 2014-02-22 DIAGNOSIS — J449 Chronic obstructive pulmonary disease, unspecified: Secondary | ICD-10-CM

## 2014-03-07 MED ORDER — OMALIZUMAB 150 MG ~~LOC~~ SOLR
300.0000 mg | Freq: Once | SUBCUTANEOUS | Status: AC
  Administered 2014-03-07: 300 mg via SUBCUTANEOUS

## 2014-03-08 ENCOUNTER — Ambulatory Visit (INDEPENDENT_AMBULATORY_CARE_PROVIDER_SITE_OTHER): Payer: Medicare Other

## 2014-03-08 DIAGNOSIS — J449 Chronic obstructive pulmonary disease, unspecified: Secondary | ICD-10-CM

## 2014-03-11 ENCOUNTER — Other Ambulatory Visit: Payer: Self-pay

## 2014-03-11 DIAGNOSIS — Z1231 Encounter for screening mammogram for malignant neoplasm of breast: Secondary | ICD-10-CM

## 2014-03-13 MED ORDER — OMALIZUMAB 150 MG ~~LOC~~ SOLR
300.0000 mg | Freq: Once | SUBCUTANEOUS | Status: AC
  Administered 2014-03-13: 300 mg via SUBCUTANEOUS

## 2014-03-22 ENCOUNTER — Ambulatory Visit (INDEPENDENT_AMBULATORY_CARE_PROVIDER_SITE_OTHER): Payer: Medicare Other

## 2014-03-22 DIAGNOSIS — J449 Chronic obstructive pulmonary disease, unspecified: Secondary | ICD-10-CM

## 2014-04-01 ENCOUNTER — Telehealth: Payer: Self-pay | Admitting: Internal Medicine

## 2014-04-01 MED ORDER — OMALIZUMAB 150 MG ~~LOC~~ SOLR
300.0000 mg | Freq: Once | SUBCUTANEOUS | Status: AC
  Administered 2014-04-01: 300 mg via SUBCUTANEOUS

## 2014-04-01 NOTE — Telephone Encounter (Signed)
Pt decided not to leave a message.  Holly D Pryor ° °

## 2014-04-03 ENCOUNTER — Ambulatory Visit: Payer: Medicare Other | Admitting: Internal Medicine

## 2014-04-05 ENCOUNTER — Ambulatory Visit (INDEPENDENT_AMBULATORY_CARE_PROVIDER_SITE_OTHER): Payer: Medicare Other

## 2014-04-05 DIAGNOSIS — J449 Chronic obstructive pulmonary disease, unspecified: Secondary | ICD-10-CM

## 2014-04-08 ENCOUNTER — Ambulatory Visit: Payer: Medicare Other

## 2014-04-12 ENCOUNTER — Encounter: Payer: Self-pay | Admitting: Internal Medicine

## 2014-04-12 MED ORDER — OMALIZUMAB 150 MG ~~LOC~~ SOLR
300.0000 mg | Freq: Once | SUBCUTANEOUS | Status: AC
  Administered 2014-04-12: 300 mg via SUBCUTANEOUS

## 2014-04-19 ENCOUNTER — Ambulatory Visit (INDEPENDENT_AMBULATORY_CARE_PROVIDER_SITE_OTHER): Payer: Medicare Other

## 2014-04-19 DIAGNOSIS — J449 Chronic obstructive pulmonary disease, unspecified: Secondary | ICD-10-CM

## 2014-04-22 ENCOUNTER — Ambulatory Visit
Admission: RE | Admit: 2014-04-22 | Discharge: 2014-04-22 | Disposition: A | Discharge status: Home / Self Care | Payer: Medicare Other | Source: Ambulatory Visit

## 2014-04-22 DIAGNOSIS — Z1231 Encounter for screening mammogram for malignant neoplasm of breast: Secondary | ICD-10-CM

## 2014-04-22 IMAGING — MG MM DIGITAL SCREENING BILAT W/ CAD
4 series · 4 of 4 positions shown · non-contrast
Comparison: Previous exam(s).

CLINICAL DATA: Screening.

EXAM:
DIGITAL SCREENING BILATERAL MAMMOGRAM WITH CAD

[R CC]
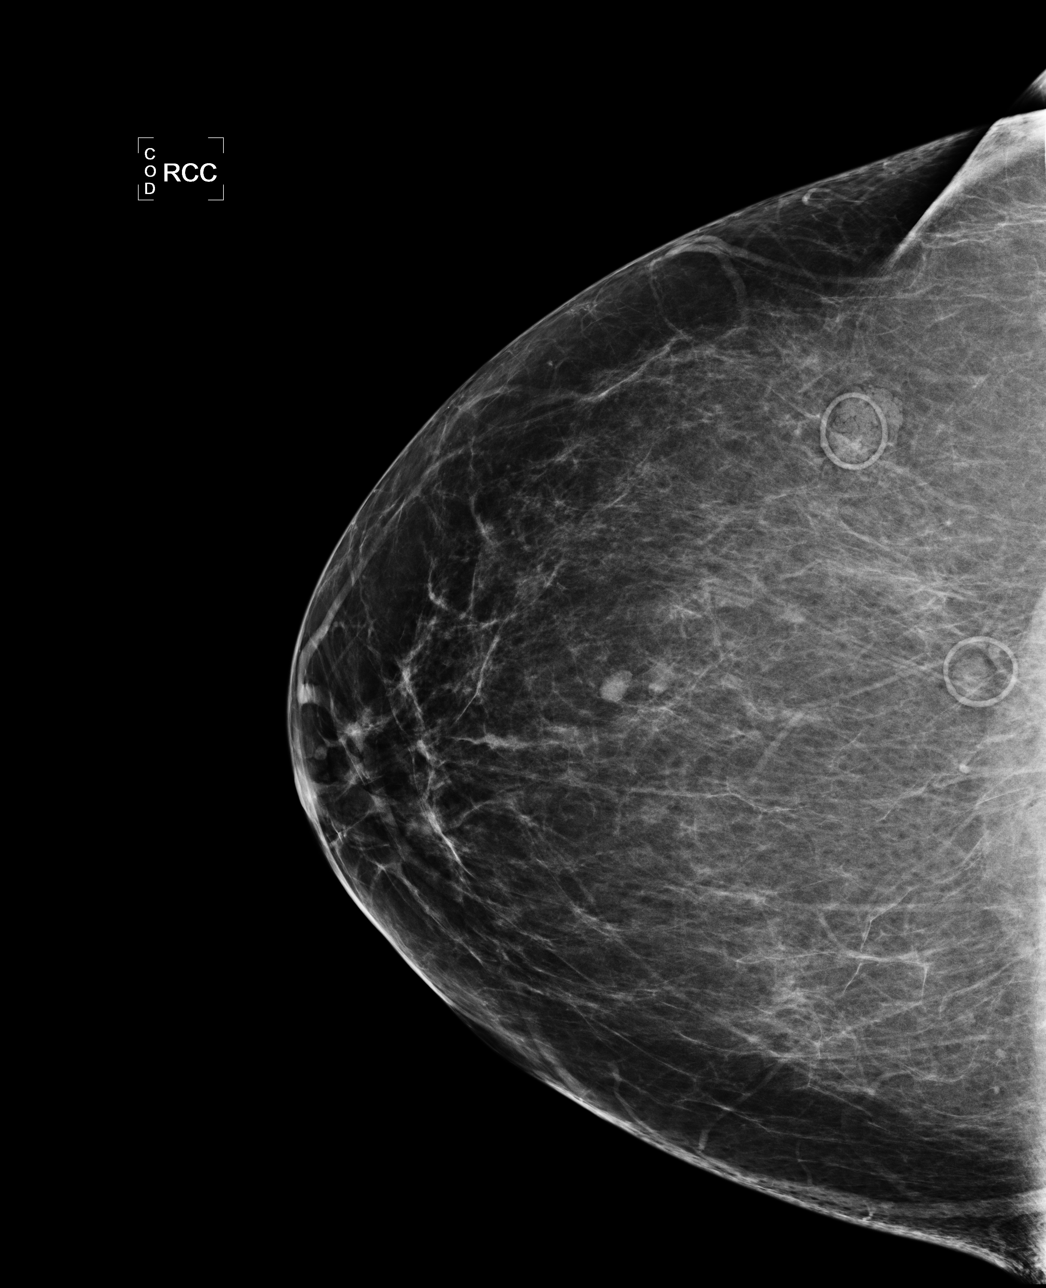

[L CC]
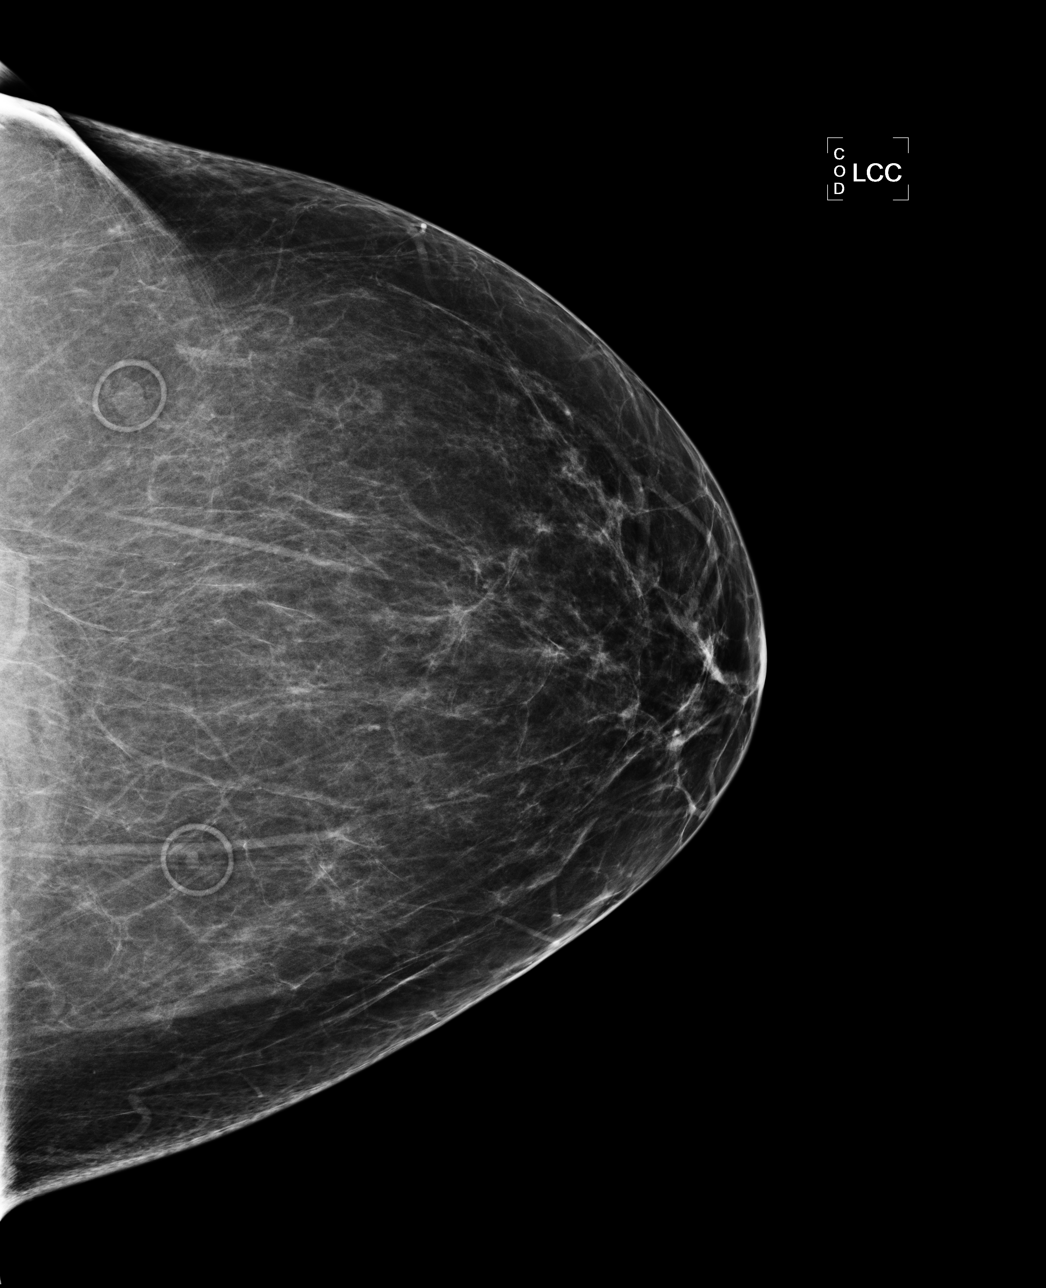

[L MLO]
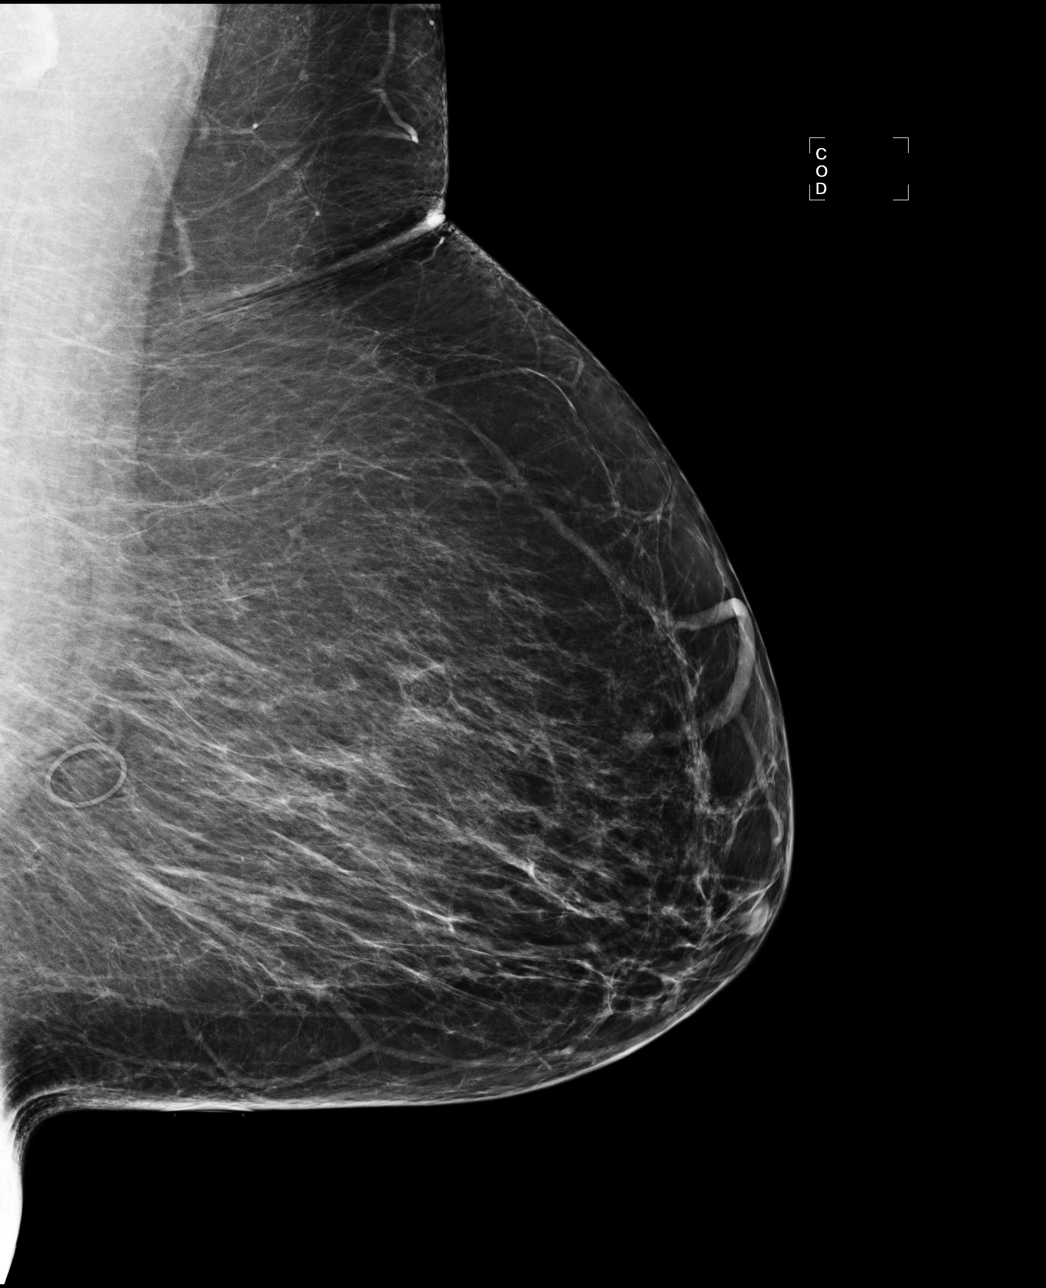

[R MLO]
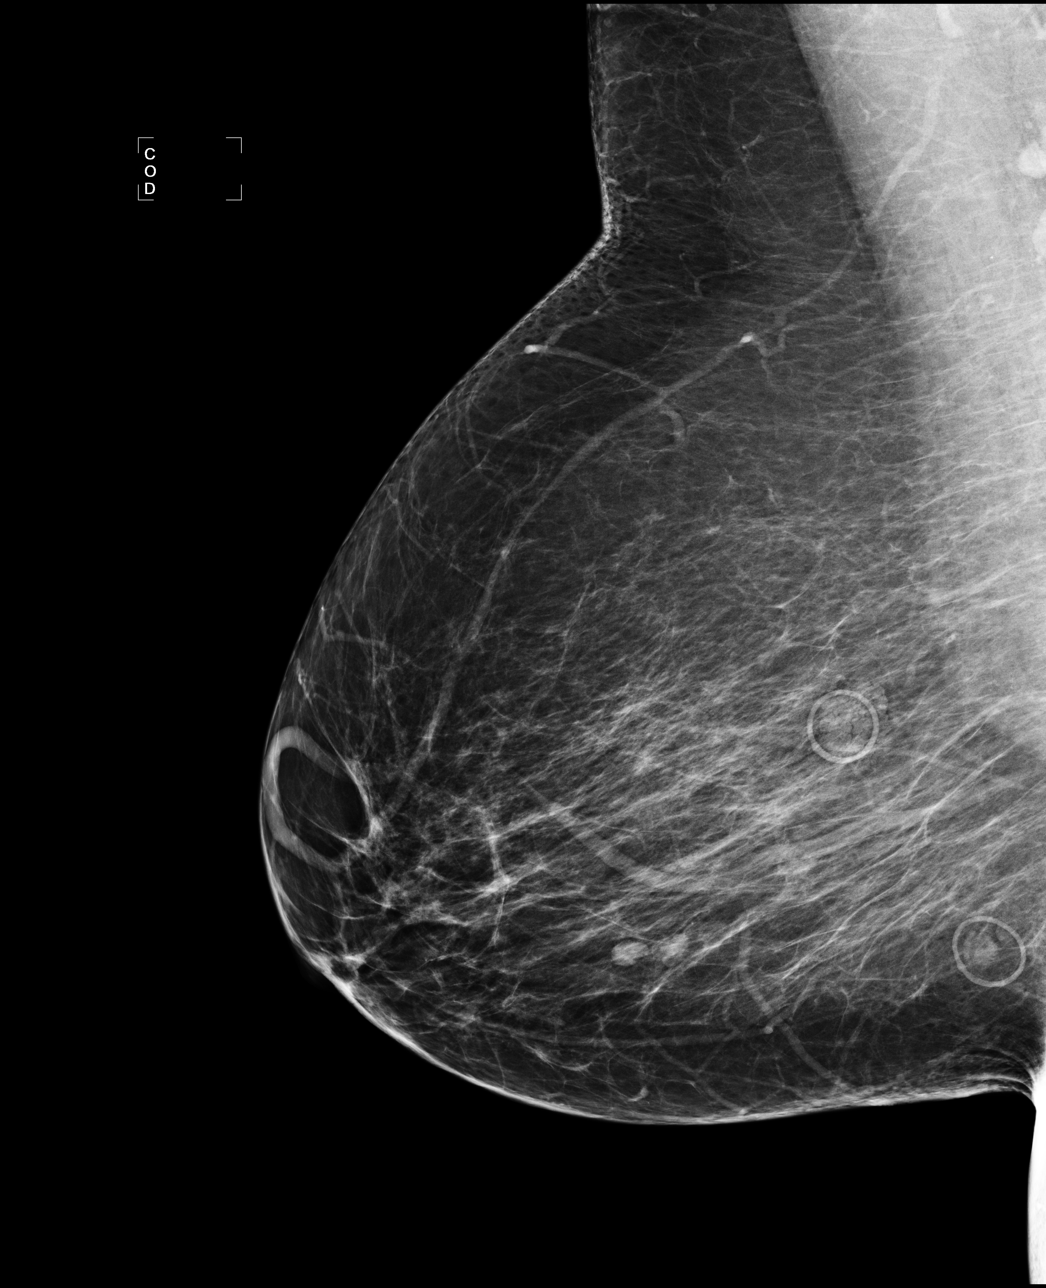

[4 of 4 positions shown; findings below may reference images not displayed]

ACR Breast Density Category b: There are scattered areas of
fibroglandular density.
FINDINGS: There are no findings suspicious for malignancy. Images were
processed with CAD.
IMPRESSION: No mammographic evidence of malignancy. A result letter of this
screening mammogram will be mailed directly to the patient.

RECOMMENDATION:
Screening mammogram in one year. (Code:[US])

BI-RADS CATEGORY  1: Negative.

## 2014-04-22 MED ORDER — OMALIZUMAB 150 MG ~~LOC~~ SOLR
300.0000 mg | Freq: Once | SUBCUTANEOUS | Status: AC
  Administered 2014-04-19: 300 mg via SUBCUTANEOUS

## 2014-05-03 ENCOUNTER — Ambulatory Visit (INDEPENDENT_AMBULATORY_CARE_PROVIDER_SITE_OTHER): Payer: Medicare Other | Admitting: Internal Medicine

## 2014-05-03 ENCOUNTER — Encounter: Payer: Self-pay | Admitting: Internal Medicine

## 2014-05-03 VITALS — BP 132/86 | HR 86 | Ht 70.0 in | Wt 213.6 lb

## 2014-05-03 DIAGNOSIS — J449 Chronic obstructive pulmonary disease, unspecified: Secondary | ICD-10-CM

## 2014-05-03 DIAGNOSIS — J069 Acute upper respiratory infection, unspecified: Secondary | ICD-10-CM

## 2014-05-03 MED ORDER — DOXYCYCLINE HYCLATE 100 MG PO TABS: ORAL_TABLET | ORAL | Status: DC

## 2014-05-03 NOTE — Progress Notes (Signed)
Patient ID: Laura Mendez, female    DOB: 06-28-1940, 73 y.o.   MRN: [id number]  HPI 02/03/11- 33 yoF former smoker followed for Severe asthma/ COPD FEV1 47%, Xolair Last here July 29, 2010 Reports doing fine. Wants flu shot.  She remains pleased with the difference she credits to [place]. She stayed in this summer to avoid heat. Noticing some early fall nasal congestion. Has needed to use her rescue inhaler a couple of times and to take Zyrtec, blaming fall season. 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  08/02/11-  23 yoF former smoker followed for severe asthma/ COPD FEV1 47%, Xolair Very complimentary of our office managers assistance with some administrative problems with her Xolair. She is very strongly convinced that [place] keeps her breathing problems under control and describes increasing chest congestion when she had be off for a couple of doses. Got through the winter with a few minor colds which she show cough. Now walking one hour per day. Prefers Ventolin rescue inhaler over other brands she has tried. Recent mild nasal stuffiness despite Xolair. Occasional rhinorrhea is helped by Zyrtec.  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 Plans flu vaccine later  08/04/12- 71 yoF former smoker followed for severe asthma/ COPD FEV1 47%, Xolair FOLLOWS FOR: still on Xolair; Denies SOB, wheezing, cough, or congestion. Very good winter. Realizes value of avoiding sick contacts.  Had bad dry-skin trunk itching during winter, relieved by stopping Zyrtec. Now a little seasonal nasal drip.  02/09/13- 36 yoF former smoker followed for severe asthma/ COPD FEV1 47%, Xolair FOLLOWS FOR: continues Xolair and doing great with it; no flare ups at this time Discussed on-going Xolair therapy. Uses Advair 1xday, occ rescue  HFA Discussed flu vax.  09/28/13- 40 yoF former smoker followed for severe asthma/ COPD FEV1 47%, Xolair FOLLOWS FOR:  Doing well on Xolair injections.  No concerns today Says again that Xolair makes a "huge difference", but concerned how long she can handle the cost.  On rare occasions when she has been late by a few days, she feels chest tightening and wheeze starting to come back.No chest pain, productive cough or discolored sputum. Today feels well.  CXR 02/12/13 IMPRESSION:  1. No acute cardiopulmonary disease.  2. Grossly unchanged findings of lung hyperexpansion, bronchitic  change and bibasilar subsegmental atelectasis/scar.  3. Unchanged prominence of the central pulmonary vasculature as  demonstrated on remote chest CT performed 07/2006.  Original Report Authenticated By: Jake Seats, MD  05/03/14- 81 yoF former smoker followed for severe asthma/ COPD FEV1 47%, Xolair FOLLOWS FOR: Still on Xolair and doing well overall. Started having a sore throat today. Minimal upper respiratory symptoms that might turn into a cold over the next day or so. Because of the holiday we discussed putting her holds an antibiotic prescription but I emphasized not to take it too early. Continues to give great credit Xolair injections although she gets frustrated by the financing.  Review of Systems-see HPI Constitutional:   No-   weight loss, night sweats, fevers, chills, fatigue, lassitude. HEENT:   No-  headaches, difficulty swallowing, tooth/dental problems,+ sore throat,       No-  sneezing, itching, ear ache,  nasal congestion, +post nasal drip,  CV:  No-   chest pain, orthopnea, PND, swelling in  lower extremities, anasarca, dizziness, palpitations Resp:  +Some shortness of breath with exertion or at rest- unchanged             No-   productive cough,  No non-productive cough,  No-  coughing up of blood.              No-   change in color of mucus.  No- wheezing.   Skin: No-   rash or  lesions. GI:  No-   heartburn, indigestion, abdominal pain, nausea, vomiting, GU:  MS:  No-   joint pain or swelling.   Neuro- nothing unusual  Psych:  No- change in mood or affect. No depression or anxiety.  No memory loss  Objective:     General- Alert, Oriented, Affect-appropriate, Distress- none acute, cheerful Skin- rash-none, lesions- none, excoriation- none. Skin is dry. Lymphadenopathy- none Head- atraumatic            Eyes- Gross vision intact, PERRLA, conjunctivae clear secretions            Ears- Hearing, canals normal            Nose- Mild turbinate edema, No-Septal dev, mucus, polyps, erosion, perforation             Throat- Mallampati II , mucosa -not red, drainage- none, tonsils- atrophic Neck- flexible , trachea midline, no stridor , thyroid nl, carotid no bruit Chest - symmetrical excursion , unlabored           Heart/CV- RRR , no murmur , no gallop  , no rub, nl s1 s2                           - JVD- none , edema- none, stasis changes- none, varices- none           Lung- +clear to P&A- distant, wheeze- none, cough- none , dullness-none, rub- none           Chest wall-  Abd-  Br/ Gen/ Rectal- Not done, not indicated Extrem- cyanosis- none, clubbing, none, atrophy- none, strength- nl Neuro- grossly intact to observation

## 2014-05-03 NOTE — Assessment & Plan Note (Signed)
She was exposed to a sick family member and may be developing a cold. Hopefully it will get worse than that. I discussed antibiotic use in this situation. Plan-doxycycline to hold

## 2014-05-03 NOTE — Patient Instructions (Addendum)
We can continue Xolair  Script printed for doxycycline to use if you have a significant infection  Please call if we can help

## 2014-05-03 NOTE — Assessment & Plan Note (Signed)
Remains significantly more stable and less wheeze and fewer exacerbations since she has been on Xolair. We discussed considerations related to continuing this treatment and she very much does not want to stop.

## 2014-05-06 ENCOUNTER — Ambulatory Visit (INDEPENDENT_AMBULATORY_CARE_PROVIDER_SITE_OTHER): Payer: Medicare Other

## 2014-05-06 DIAGNOSIS — J4520 Mild intermittent asthma, uncomplicated: Secondary | ICD-10-CM

## 2014-05-07 ENCOUNTER — Telehealth: Payer: Self-pay | Admitting: Internal Medicine

## 2014-05-07 MED ORDER — OMALIZUMAB 150 MG ~~LOC~~ SOLR
300.0000 mg | Freq: Once | SUBCUTANEOUS | Status: AC
  Administered 2014-05-07: 300 mg via SUBCUTANEOUS

## 2014-05-07 MED ORDER — DOXYCYCLINE HYCLATE 100 MG PO TABS: ORAL_TABLET | ORAL | Status: DC

## 2014-05-07 NOTE — Telephone Encounter (Signed)
Spoke with pt and she requests that we resend rx for Doxycycline to pharmacy .  Original rx was destroyed because pt had told them she didn't want it but now she does.  Rx Sent.

## 2014-05-20 ENCOUNTER — Ambulatory Visit (INDEPENDENT_AMBULATORY_CARE_PROVIDER_SITE_OTHER): Payer: Medicare Other

## 2014-05-20 DIAGNOSIS — J4520 Mild intermittent asthma, uncomplicated: Secondary | ICD-10-CM

## 2014-05-20 MED ORDER — OMALIZUMAB 150 MG ~~LOC~~ SOLR
300.0000 mg | Freq: Once | SUBCUTANEOUS | Status: AC
  Administered 2014-05-20: 300 mg via SUBCUTANEOUS

## 2014-05-22 ENCOUNTER — Ambulatory Visit: Payer: Medicare Other

## 2014-06-05 ENCOUNTER — Ambulatory Visit (INDEPENDENT_AMBULATORY_CARE_PROVIDER_SITE_OTHER): Payer: Medicare Other

## 2014-06-05 DIAGNOSIS — J4540 Moderate persistent asthma, uncomplicated: Secondary | ICD-10-CM

## 2014-06-05 DIAGNOSIS — J449 Chronic obstructive pulmonary disease, unspecified: Secondary | ICD-10-CM

## 2014-06-06 MED ORDER — OMALIZUMAB 150 MG ~~LOC~~ SOLR
300.0000 mg | Freq: Once | SUBCUTANEOUS | Status: AC
  Administered 2014-06-05: 300 mg via SUBCUTANEOUS

## 2014-06-07 ENCOUNTER — Ambulatory Visit: Payer: Medicare Other

## 2014-06-21 ENCOUNTER — Ambulatory Visit (INDEPENDENT_AMBULATORY_CARE_PROVIDER_SITE_OTHER): Payer: Medicare Other

## 2014-06-21 DIAGNOSIS — J449 Chronic obstructive pulmonary disease, unspecified: Secondary | ICD-10-CM

## 2014-06-21 DIAGNOSIS — J4540 Moderate persistent asthma, uncomplicated: Secondary | ICD-10-CM

## 2014-06-21 MED ORDER — OMALIZUMAB 150 MG ~~LOC~~ SOLR
300.0000 mg | Freq: Once | SUBCUTANEOUS | Status: AC
  Administered 2014-06-21: 300 mg via SUBCUTANEOUS

## 2014-07-05 ENCOUNTER — Ambulatory Visit (INDEPENDENT_AMBULATORY_CARE_PROVIDER_SITE_OTHER): Payer: Medicare Other

## 2014-07-05 DIAGNOSIS — J4540 Moderate persistent asthma, uncomplicated: Secondary | ICD-10-CM | POA: Diagnosis not present

## 2014-07-05 DIAGNOSIS — J449 Chronic obstructive pulmonary disease, unspecified: Secondary | ICD-10-CM

## 2014-07-11 ENCOUNTER — Other Ambulatory Visit: Payer: Self-pay | Admitting: Internal Medicine

## 2014-07-11 MED ORDER — OMALIZUMAB 150 MG ~~LOC~~ SOLR
300.0000 mg | Freq: Once | SUBCUTANEOUS | Status: AC
  Administered 2014-07-05: 300 mg via SUBCUTANEOUS

## 2014-07-19 ENCOUNTER — Ambulatory Visit (INDEPENDENT_AMBULATORY_CARE_PROVIDER_SITE_OTHER): Payer: Medicare Other

## 2014-07-19 DIAGNOSIS — J4540 Moderate persistent asthma, uncomplicated: Secondary | ICD-10-CM

## 2014-07-19 MED ORDER — OMALIZUMAB 150 MG ~~LOC~~ SOLR
300.0000 mg | Freq: Once | SUBCUTANEOUS | Status: AC
  Administered 2014-07-19: 300 mg via SUBCUTANEOUS

## 2014-08-02 ENCOUNTER — Ambulatory Visit (INDEPENDENT_AMBULATORY_CARE_PROVIDER_SITE_OTHER): Payer: Medicare Other

## 2014-08-02 DIAGNOSIS — J4540 Moderate persistent asthma, uncomplicated: Secondary | ICD-10-CM

## 2014-08-02 MED ORDER — OMALIZUMAB 150 MG ~~LOC~~ SOLR
300.0000 mg | Freq: Once | SUBCUTANEOUS | Status: AC
  Administered 2014-08-02: 300 mg via SUBCUTANEOUS

## 2014-08-08 ENCOUNTER — Other Ambulatory Visit: Payer: Self-pay | Admitting: Internal Medicine

## 2014-08-08 NOTE — Telephone Encounter (Signed)
Last ov 05/03/14 next 11/04/14 Refill for inhaler sent.

## 2014-08-12 ENCOUNTER — Telehealth: Payer: Self-pay | Admitting: Internal Medicine

## 2014-08-12 MED ORDER — FLUTICASONE-SALMETEROL 250-50 MCG/DOSE IN AEPB: INHALATION_SPRAY | RESPIRATORY_TRACT | Status: DC

## 2014-08-12 NOTE — Telephone Encounter (Signed)
Spoke with pt. She needs [facility]air sent to [facility] 2 inhalers for her PAN to pay for her inhalers. I have sent this in. Pt reports she has been paying for ventolin out of pocket bc [employer] is requesting her use proair/proventil. Pt wants Dr. Annamaria Boots to write a letter stating she needs ventolin. Pt did not like the proair or proventil. Please advise thanks

## 2014-08-12 NOTE — Telephone Encounter (Signed)
She signed a Soil scientist with her insurance to use their products. She would need to be more specific about why she doesn't feel she can use the insurance preferred inhalers if I am going to try to argue for her.  Suggest she try a sample of Proair Respiclick (I have one)  2 puffs every 4-6 hours as needed

## 2014-08-13 NOTE — Telephone Encounter (Signed)
lmtcb x1 

## 2014-08-14 MED ORDER — ALBUTEROL SULFATE 108 (90 BASE) MCG/ACT IN AEPB: 2.0000 | INHALATION_SPRAY | RESPIRATORY_TRACT | Status: DC | PRN

## 2014-08-14 NOTE — Telephone Encounter (Signed)
Patient okay with trying ProAir Respiclick.  Sample left at front desk and will need to be trained on usage once she arrives for her [facility] appointment on Monday morning.  (Rx was sent to pharmacy in error and I called them to cancel the order, was meant to be put in as sample) Patient notified. Nothing further needed.\

## 2014-08-14 NOTE — Telephone Encounter (Signed)
lmtcb for pt.  

## 2014-08-19 ENCOUNTER — Ambulatory Visit (INDEPENDENT_AMBULATORY_CARE_PROVIDER_SITE_OTHER): Payer: Medicare Other

## 2014-08-19 DIAGNOSIS — J4540 Moderate persistent asthma, uncomplicated: Secondary | ICD-10-CM

## 2014-08-19 MED ORDER — OMALIZUMAB 150 MG ~~LOC~~ SOLR
300.0000 mg | Freq: Once | SUBCUTANEOUS | Status: AC
  Administered 2014-08-19: 300 mg via SUBCUTANEOUS

## 2014-09-05 ENCOUNTER — Ambulatory Visit (INDEPENDENT_AMBULATORY_CARE_PROVIDER_SITE_OTHER): Payer: Medicare Other

## 2014-09-05 DIAGNOSIS — J4540 Moderate persistent asthma, uncomplicated: Secondary | ICD-10-CM

## 2014-09-05 MED ORDER — OMALIZUMAB 150 MG ~~LOC~~ SOLR
300.0000 mg | Freq: Once | SUBCUTANEOUS | Status: AC
  Administered 2014-09-05: 300 mg via SUBCUTANEOUS

## 2014-09-20 ENCOUNTER — Ambulatory Visit (INDEPENDENT_AMBULATORY_CARE_PROVIDER_SITE_OTHER): Payer: Medicare Other

## 2014-09-20 DIAGNOSIS — J4520 Mild intermittent asthma, uncomplicated: Secondary | ICD-10-CM

## 2014-09-20 MED ORDER — OMALIZUMAB 150 MG ~~LOC~~ SOLR
300.0000 mg | Freq: Once | SUBCUTANEOUS | Status: AC
  Administered 2014-09-20: 300 mg via SUBCUTANEOUS

## 2014-09-30 ENCOUNTER — Telehealth: Payer: Self-pay | Admitting: Internal Medicine

## 2014-10-03 NOTE — Telephone Encounter (Signed)
#   vials:4 Ordered date:09-30-14 Shipping Date:10-01-14    # Vials:4 Arrival Date:10/01/14  Lot #:[id number]  Exp Date:12/19  

## 2014-10-04 ENCOUNTER — Ambulatory Visit: Payer: Medicare Other

## 2014-10-04 ENCOUNTER — Ambulatory Visit (INDEPENDENT_AMBULATORY_CARE_PROVIDER_SITE_OTHER): Payer: Medicare Other

## 2014-10-04 DIAGNOSIS — J4520 Mild intermittent asthma, uncomplicated: Secondary | ICD-10-CM

## 2014-10-04 MED ORDER — OMALIZUMAB 150 MG ~~LOC~~ SOLR
300.0000 mg | Freq: Once | SUBCUTANEOUS | Status: AC
  Administered 2014-10-04: 300 mg via SUBCUTANEOUS

## 2014-10-18 ENCOUNTER — Ambulatory Visit (INDEPENDENT_AMBULATORY_CARE_PROVIDER_SITE_OTHER): Payer: Medicare Other

## 2014-10-18 DIAGNOSIS — J4540 Moderate persistent asthma, uncomplicated: Secondary | ICD-10-CM

## 2014-10-24 MED ORDER — OMALIZUMAB 150 MG ~~LOC~~ SOLR
300.0000 mg | Freq: Once | SUBCUTANEOUS | Status: AC
  Administered 2014-10-18: 300 mg via SUBCUTANEOUS

## 2014-10-25 ENCOUNTER — Telehealth: Payer: Self-pay | Admitting: Internal Medicine

## 2014-10-25 NOTE — Telephone Encounter (Signed)
#   vials:4 Ordered date:10/25/14 Shipping Date:10/25/14

## 2014-10-29 NOTE — Telephone Encounter (Signed)
#   Vials:4 Arrival Date:10/29/14 Lot #:[id number]  Exp Date:1/20  

## 2014-11-01 ENCOUNTER — Ambulatory Visit (INDEPENDENT_AMBULATORY_CARE_PROVIDER_SITE_OTHER): Payer: Medicare Other

## 2014-11-01 DIAGNOSIS — J4540 Moderate persistent asthma, uncomplicated: Secondary | ICD-10-CM

## 2014-11-01 MED ORDER — OMALIZUMAB 150 MG ~~LOC~~ SOLR
300.0000 mg | Freq: Once | SUBCUTANEOUS | Status: AC
  Administered 2014-11-01: 300 mg via SUBCUTANEOUS

## 2014-11-04 ENCOUNTER — Ambulatory Visit (INDEPENDENT_AMBULATORY_CARE_PROVIDER_SITE_OTHER): Payer: Medicare Other | Admitting: Internal Medicine

## 2014-11-04 ENCOUNTER — Encounter: Payer: Self-pay | Admitting: Internal Medicine

## 2014-11-04 ENCOUNTER — Ambulatory Visit (INDEPENDENT_AMBULATORY_CARE_PROVIDER_SITE_OTHER)
Admission: RE | Admit: 2014-11-04 | Discharge: 2014-11-04 | Disposition: A | Discharge status: Home / Self Care | Payer: Medicare Other | Source: Ambulatory Visit | Attending: Internal Medicine | Admitting: Internal Medicine

## 2014-11-04 VITALS — BP 120/76 | HR 80 | Ht 70.0 in | Wt 210.0 lb

## 2014-11-04 DIAGNOSIS — J449 Chronic obstructive pulmonary disease, unspecified: Secondary | ICD-10-CM

## 2014-11-04 IMAGING — CR DG CHEST 2V
2 series · 2 of 2 positions shown · non-contrast
Comparison: [DATE]

CLINICAL DATA: History of asthma

EXAM:
CHEST - 2 VIEW

[view not recorded (1 of 2)]
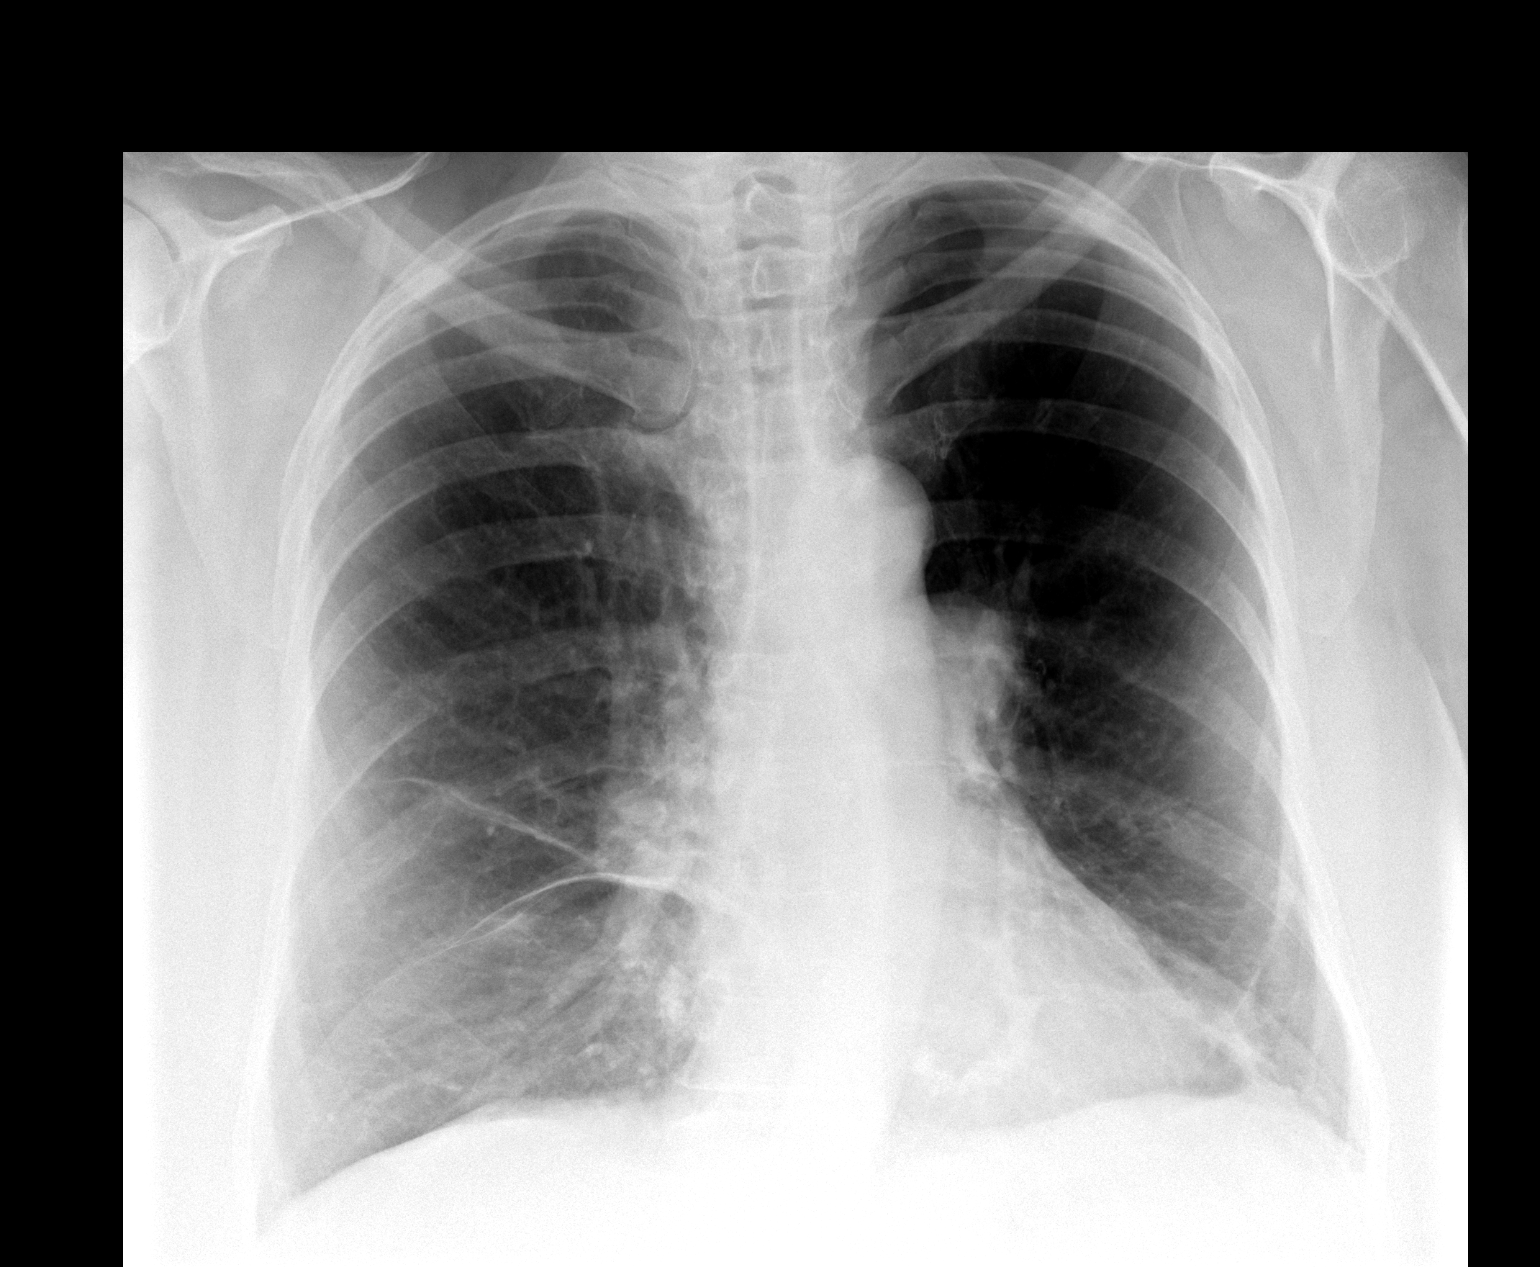

[view not recorded (2 of 2)]
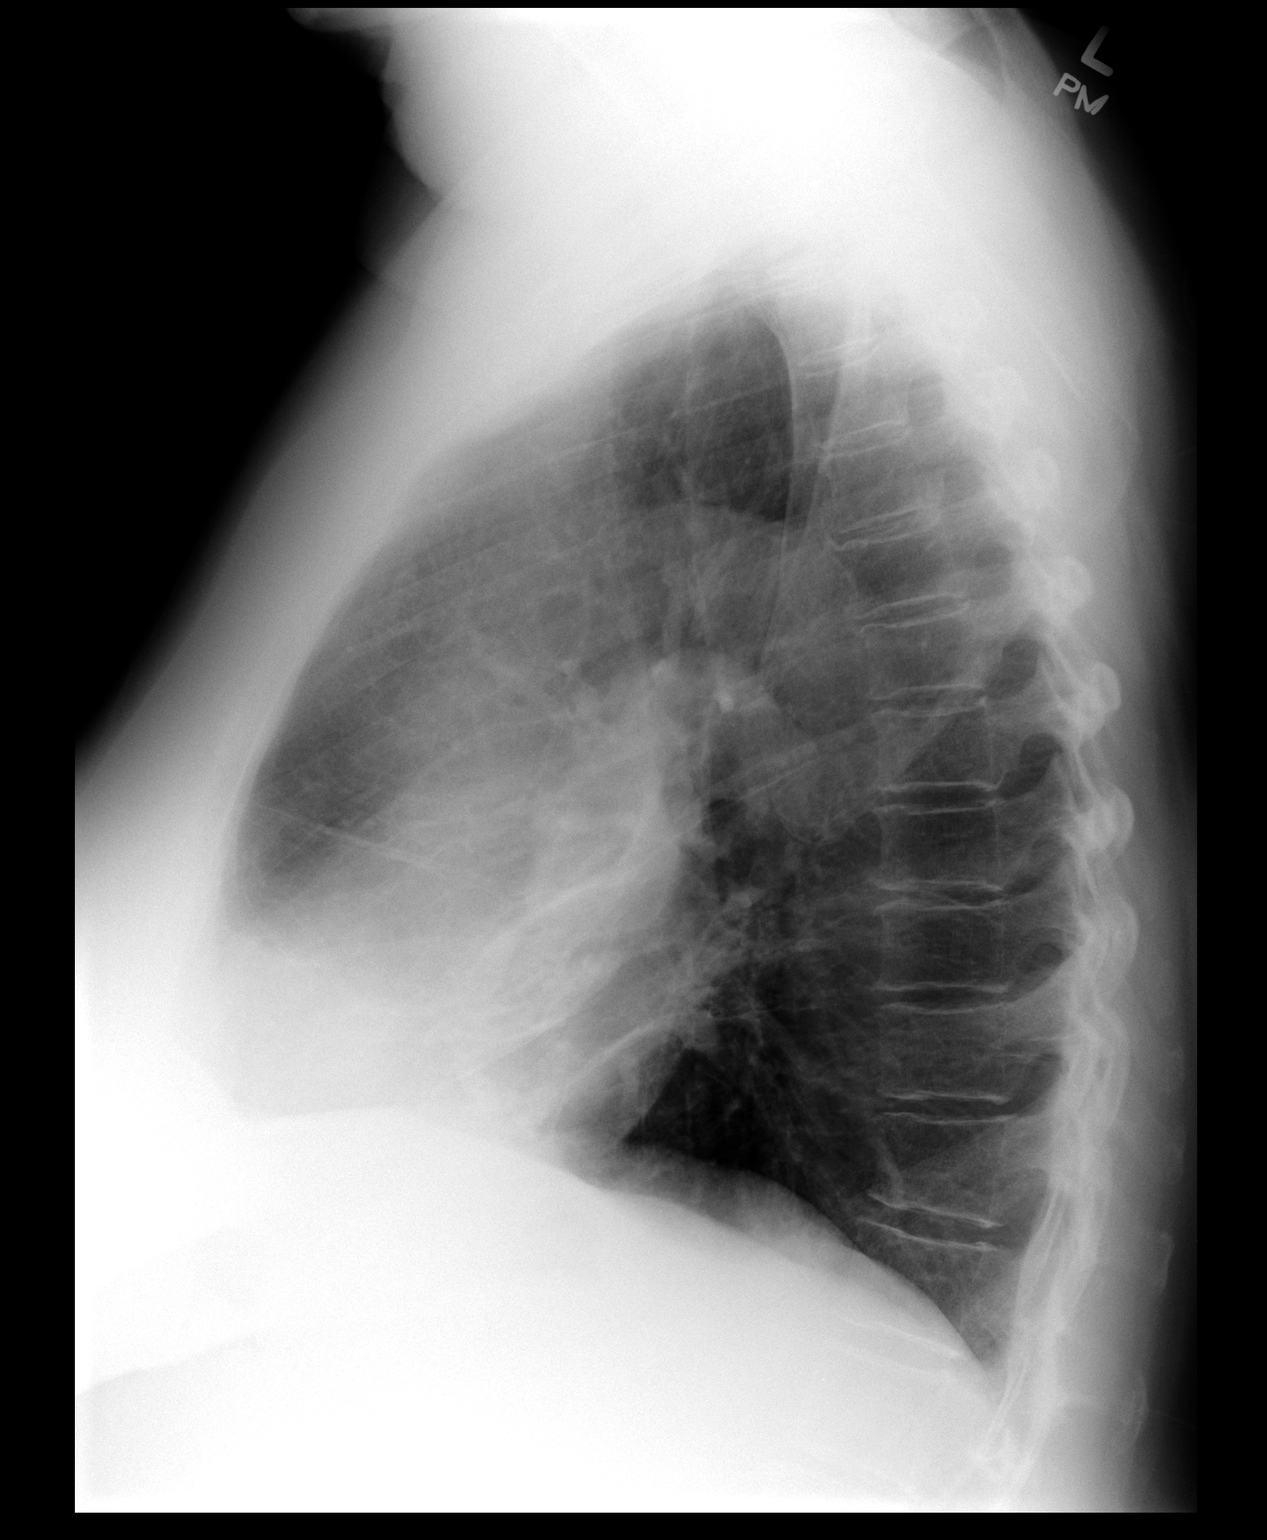

[2 of 2 positions shown; findings below may reference images not displayed]

FINDINGS: Cardiac shadow is stable. Stable mitral annular calcifications are
seen. Scarring is noted in the bases bilaterally but stable from the
prior study. No focal infiltrate or sizable effusion is seen. No
bony abnormality is noted.
IMPRESSION: Stable bibasilar scarring.

## 2014-11-04 NOTE — Patient Instructions (Addendum)
Equivalent rescue albuterol inhalers- Proair, Ventolin, Proventil, Let us know which one you would like to use  It would be ok to try getting your Xolair shots every 3 weeks instead of every 2, as discussed last year with Joellen Jersey, if you want to.  Order CXR   Dx Asthma with COPD

## 2014-11-04 NOTE — Progress Notes (Signed)
Patient ID: Laura Mendez, female    DOB: 08-12-1940, 74 y.o.   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 Wants flu shot.  She remains pleased with the difference she credits to [place]. She stayed in this summer to avoid heat. Noticing some early fall nasal congestion. Has needed to use her rescue inhaler a couple of times and to take Zyrtec, blaming fall season. 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  08/02/11-  56 yoF former smoker followed for severe asthma/ COPD FEV1 47%, Xolair Very complimentary of our office managers assistance with some administrative problems with her Xolair. She is very strongly convinced that [place] keeps her breathing problems under control and describes increasing chest congestion when she had be off for a couple of doses. Got through the winter with a few minor colds which she show cough. Now walking one hour per day. Prefers Ventolin rescue inhaler over other brands she has tried. Recent mild nasal stuffiness despite Xolair. Occasional rhinorrhea is helped by Zyrtec.  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 Plans flu vaccine later  08/04/12- 71 yoF former smoker followed for severe asthma/ COPD FEV1 47%, Xolair FOLLOWS FOR: still on Xolair; Denies SOB, wheezing, cough, or congestion. Very good winter. Realizes value of avoiding sick contacts.  Had bad dry-skin trunk itching during winter, relieved by stopping Zyrtec. Now a little seasonal nasal drip.  02/09/13- 43 yoF former smoker followed for severe asthma/ COPD FEV1 47%, Xolair FOLLOWS FOR: continues Xolair and doing great with it; no flare ups at this time Discussed on-going Xolair therapy. Uses Advair 1xday, occ rescue  HFA Discussed flu vax.  09/28/13- 24 yoF former smoker followed for severe asthma/ COPD FEV1 47%, Xolair FOLLOWS FOR:  Doing well on Xolair injections.  No concerns today Says again that Xolair makes a "huge difference", but concerned how long she can handle the cost.  On rare occasions when she has been late by a few days, she feels chest tightening and wheeze starting to come back.No chest pain, productive cough or discolored sputum. Today feels well.  CXR 02/12/13 IMPRESSION:  1. No acute cardiopulmonary disease.  2. Grossly unchanged findings of lung hyperexpansion, bronchitic  change and bibasilar subsegmental atelectasis/scar.  3. Unchanged prominence of the central pulmonary vasculature as  demonstrated on remote chest CT performed 07/2006.  Original Report Authenticated By: Jake Seats, MD  05/03/14- 42 yoF former smoker followed for severe asthma/ COPD FEV1 47%, Xolair FOLLOWS FOR: Still on Xolair and doing well overall. Started having a sore throat today. Minimal upper respiratory symptoms that might turn into a cold over the next day or so. Because of the holiday we discussed putting her holds an antibiotic prescription but I emphasized not to take it too early. Continues to give great credit Xolair injections although she gets frustrated by the financing.  11/04/14-  74 yoF former smoker followed for severe asthma/ COPD FEV1 47%, Xolair FOLLOWS FOR: Still on Xolair, this is still working well. Ventolin is not covered on her formulary, will need an alternative. She is concerned about cost of medications and says she feels so well she is afraid to make changes. In particular she credits Xolair with "  saving my life" and is concerned about potential loss of financial coverage.  Review of Systems-see HPI Constitutional:   No-   weight loss, night sweats, fevers, chills, fatigue, lassitude. HEENT:   No-  headaches, difficulty swallowing, tooth/dental problems,+ sore throat,       No-   sneezing, itching, ear ache,  nasal congestion, +post nasal drip,  CV:  No-   chest pain, orthopnea, PND, swelling in lower extremities, anasarca, dizziness, palpitations Resp:  +Some shortness of breath with exertion or at rest- unchanged             No-   productive cough,  No non-productive cough,  No-  coughing up of blood.              No-   change in color of mucus.  No- wheezing.   Skin: No-   rash or lesions. GI:  No-   heartburn, indigestion, abdominal pain, nausea, vomiting, GU:  MS:  No-   joint pain or swelling.   Neuro- nothing unusual  Psych:  No- change in mood or affect. No depression or anxiety.  No memory loss  Objective:     General- Alert, Oriented, Affect-appropriate, Distress- none acute, cheerful Skin- rash-none, lesions- none, excoriation- none. Skin is dry. Lymphadenopathy- none Head- atraumatic            Eyes- Gross vision intact, PERRLA, conjunctivae clear secretions            Ears- Hearing, canals normal            Nose- Mild turbinate edema, No-Septal dev, mucus, polyps, erosion, perforation             Throat- Mallampati II , mucosa -not red, drainage- none, tonsils- atrophic Neck- flexible , trachea midline, no stridor , thyroid nl, carotid no bruit Chest - symmetrical excursion , unlabored           Heart/CV- RRR , no murmur , no gallop  , no rub, nl s1 s2                           - JVD- none , edema- none, stasis changes- none, varices- none           Lung- +clear to P&A- distant, wheeze- none, cough- none , dullness-none, rub- none           Chest wall-  Abd-  Br/ Gen/ Rectal- Not done, not indicated Extrem- cyanosis- none, clubbing, none, atrophy- none, strength- nl Neuro- grossly intact to observation

## 2014-11-11 ENCOUNTER — Telehealth: Payer: Self-pay | Admitting: Internal Medicine

## 2014-11-11 NOTE — Telephone Encounter (Signed)
Spoke with patient-she states she called [employer] and was told [employer] no longer has funds to cover her Xolair cost past what Insurance covers. Pt states even moving the injections from q 2 weeks to q 3 weeks she will not be able to afford to pay. Pt is aware that we do not have any other suggestions for seeking payment assistance.    Pt aware that I will send message to CY to see what he thoughts/suggestions are for patient.

## 2014-11-11 NOTE — Telephone Encounter (Signed)
Pt returning call.Laura Mendez ° °

## 2014-11-13 NOTE — Telephone Encounter (Signed)
Laura Mendez - has this been taken care of?  Please advise.

## 2014-11-13 NOTE — Telephone Encounter (Signed)
CY is looking at this with me. Please leave message open. Thanks.

## 2014-11-15 ENCOUNTER — Ambulatory Visit: Payer: Medicare Other

## 2014-11-15 NOTE — Telephone Encounter (Signed)
Patient has called Laura Mendez they have stated that they will mail her some forms for help and that we need to fill out a [employer] form I do not have an updated form in the [place] can you help me verify the info so I can do a new form

## 2014-11-15 NOTE — Telephone Encounter (Signed)
Pt is calling back about Xolair she is running out of funds and will not be able to afford to come in to get these she has called multiple places that PAN has given her to call but still no luck.

## 2014-11-19 NOTE — Telephone Encounter (Signed)
Please advise Laura Mendez thanks

## 2014-11-20 NOTE — Telephone Encounter (Signed)
Spoke to the patient she will be coming in to sign the xolair form she still has no help thru [employer] there are a few charges that we are waiting on them to pay and a few that we are waiting on insurance to pay

## 2014-11-21 NOTE — Telephone Encounter (Signed)
Pt came by and signed [employer] form for Xolair; I am completing this request; however I have to order her paper chart as no information in EPIC for lab numbers needed.

## 2014-11-22 ENCOUNTER — Ambulatory Visit (INDEPENDENT_AMBULATORY_CARE_PROVIDER_SITE_OTHER): Payer: Medicare Other

## 2014-11-22 DIAGNOSIS — J4540 Moderate persistent asthma, uncomplicated: Secondary | ICD-10-CM

## 2014-11-22 MED ORDER — OMALIZUMAB 150 MG ~~LOC~~ SOLR
300.0000 mg | Freq: Once | SUBCUTANEOUS | Status: AC
  Administered 2014-11-22: 300 mg via SUBCUTANEOUS

## 2014-11-27 ENCOUNTER — Telehealth: Payer: Self-pay | Admitting: Internal Medicine

## 2014-11-27 NOTE — Telephone Encounter (Signed)
#   vials:4 Ordered date:11/27/14 Shipping Date:11/28/14 

## 2014-11-28 NOTE — Telephone Encounter (Signed)
I have completed paperwork on patient after obtaining her IgE level from her paper chart. Pt is aware that I will fax back and wait to hear from [place] about assistance with medication (Xolair) through [facility].

## 2014-11-28 NOTE — Telephone Encounter (Signed)
#   Vials:4 Arrival Date:11/28/2014 Lot #:[id number] Exp Date:06/2018

## 2014-12-02 NOTE — Assessment & Plan Note (Addendum)
She has been stable for a long time. If financial assistance ends for Xolair it may be fair to stop it and see how she does. As a compromise it might help her if we gave it a longer interval. Plan-try Xolair every 3 weeks with option to stop.

## 2014-12-06 ENCOUNTER — Telehealth: Payer: Self-pay | Admitting: Internal Medicine

## 2014-12-06 ENCOUNTER — Encounter: Payer: Self-pay | Admitting: Internal Medicine

## 2014-12-06 ENCOUNTER — Ambulatory Visit (INDEPENDENT_AMBULATORY_CARE_PROVIDER_SITE_OTHER): Payer: Medicare Other

## 2014-12-06 DIAGNOSIS — J4540 Moderate persistent asthma, uncomplicated: Secondary | ICD-10-CM

## 2014-12-06 NOTE — Telephone Encounter (Signed)
lmtcb

## 2014-12-06 NOTE — Telephone Encounter (Signed)
Please let pt know that she can bring paper by here and we can fax it for her. Sorry I have been away from [facility].

## 2014-12-06 NOTE — Telephone Encounter (Signed)
lmomtcb x1 for pt 

## 2014-12-06 NOTE — Telephone Encounter (Signed)
Pt called back - [phone]. Form to be faxed to [place] so she can come by to sign.

## 2014-12-09 MED ORDER — OMALIZUMAB 150 MG ~~LOC~~ SOLR
300.0000 mg | Freq: Once | SUBCUTANEOUS | Status: AC
  Administered 2014-12-06: 300 mg via SUBCUTANEOUS

## 2014-12-09 NOTE — Telephone Encounter (Signed)
Spoke with pt, states that she will not be able to come in to sign form until Wednesday.   Will forward to Katie to make aware.

## 2014-12-11 NOTE — Telephone Encounter (Signed)
Pt came by the office today and signed needed paperwork for [place]. I have faxed back and will wait for a decision.

## 2014-12-12 NOTE — Telephone Encounter (Signed)
Spoke with patient, aware that we will send to [facility] to refax forms and follow up.

## 2014-12-12 NOTE — Telephone Encounter (Signed)
Pt cb, states [facility] told her we did not fax forms because they do not have them, she is wanting katie to refax these forms, [phone]

## 2014-12-12 NOTE — Telephone Encounter (Signed)
Unsure why [employer] did not get fax and sorry that has happened. I have re-faxed the forms to [place]. Thanks.

## 2014-12-18 ENCOUNTER — Telehealth: Payer: Self-pay | Admitting: Internal Medicine

## 2014-12-18 NOTE — Telephone Encounter (Signed)
Spoke with Ava at [facility] states the SMN needed to clarify the address for the office and know if the address needs to include a suite number or floor number. Nothing more needed at this time.

## 2014-12-20 ENCOUNTER — Ambulatory Visit (INDEPENDENT_AMBULATORY_CARE_PROVIDER_SITE_OTHER): Payer: Medicare Other

## 2014-12-20 DIAGNOSIS — J4540 Moderate persistent asthma, uncomplicated: Secondary | ICD-10-CM | POA: Diagnosis not present

## 2014-12-23 MED ORDER — OMALIZUMAB 150 MG ~~LOC~~ SOLR
300.0000 mg | Freq: Once | SUBCUTANEOUS | Status: AC
  Administered 2014-12-23: 300 mg via SUBCUTANEOUS

## 2014-12-25 ENCOUNTER — Telehealth: Payer: Self-pay | Admitting: Internal Medicine

## 2014-12-25 NOTE — Telephone Encounter (Signed)
#   vials:4 Ordered date:12/25/14  Ship Date:12/31/14

## 2014-12-31 NOTE — Telephone Encounter (Signed)
I called because they should have shipped her meds. Yesterday for today delivery, however they did not. The new ship. Date 01/01/15 for 01/02/15 del..   # vials:4 Ordered date:12/31/14 Shipping Date:01/01/15  I asked for an explanation:"I didn't want to keep you on the ph. While I went through all the notes." Translation there was no explanation.

## 2015-01-03 ENCOUNTER — Ambulatory Visit (INDEPENDENT_AMBULATORY_CARE_PROVIDER_SITE_OTHER): Payer: Medicare Other

## 2015-01-03 ENCOUNTER — Ambulatory Visit: Payer: Medicare Other

## 2015-01-03 DIAGNOSIS — J4540 Moderate persistent asthma, uncomplicated: Secondary | ICD-10-CM | POA: Diagnosis not present

## 2015-01-03 MED ORDER — OMALIZUMAB 150 MG ~~LOC~~ SOLR
300.0000 mg | Freq: Once | SUBCUTANEOUS | Status: AC
  Administered 2015-01-03: 300 mg via SUBCUTANEOUS

## 2015-01-03 NOTE — Telephone Encounter (Signed)
#   Vials:4 Arrival Date:01/03/15 Lot #[id number] Exp Date:1/20

## 2015-01-09 ENCOUNTER — Encounter: Payer: Self-pay | Admitting: Internal Medicine

## 2015-01-17 ENCOUNTER — Ambulatory Visit (INDEPENDENT_AMBULATORY_CARE_PROVIDER_SITE_OTHER): Payer: Medicare Other

## 2015-01-17 DIAGNOSIS — J4540 Moderate persistent asthma, uncomplicated: Secondary | ICD-10-CM | POA: Diagnosis not present

## 2015-01-20 ENCOUNTER — Telehealth: Payer: Self-pay | Admitting: Internal Medicine

## 2015-01-20 NOTE — Telephone Encounter (Signed)
#   vials:4 Ordered date:01/20/15 Shipping Date:01/28/15 faxed today asked del. By 01/29/15.

## 2015-01-23 MED ORDER — OMALIZUMAB 150 MG ~~LOC~~ SOLR
300.0000 mg | Freq: Once | SUBCUTANEOUS | Status: AC
  Administered 2015-01-17: 300 mg via SUBCUTANEOUS

## 2015-01-29 NOTE — Telephone Encounter (Signed)
#   Vials:4 Arrival Date:01/29/15 Lot #[id number] Exp Date:2/20

## 2015-01-31 ENCOUNTER — Ambulatory Visit (INDEPENDENT_AMBULATORY_CARE_PROVIDER_SITE_OTHER): Payer: Medicare Other

## 2015-01-31 DIAGNOSIS — J4520 Mild intermittent asthma, uncomplicated: Secondary | ICD-10-CM

## 2015-02-03 MED ORDER — OMALIZUMAB 150 MG ~~LOC~~ SOLR
300.0000 mg | Freq: Once | SUBCUTANEOUS | Status: AC
  Administered 2015-01-31: 300 mg via SUBCUTANEOUS

## 2015-02-14 ENCOUNTER — Ambulatory Visit: Payer: Medicare Other

## 2015-02-14 ENCOUNTER — Ambulatory Visit (INDEPENDENT_AMBULATORY_CARE_PROVIDER_SITE_OTHER): Payer: Medicare Other

## 2015-02-14 DIAGNOSIS — J4540 Moderate persistent asthma, uncomplicated: Secondary | ICD-10-CM

## 2015-02-18 MED ORDER — OMALIZUMAB 150 MG ~~LOC~~ SOLR
300.0000 mg | Freq: Once | SUBCUTANEOUS | Status: AC
  Administered 2015-02-14: 300 mg via SUBCUTANEOUS

## 2015-02-19 ENCOUNTER — Telehealth: Payer: Self-pay

## 2015-02-19 NOTE — Telephone Encounter (Signed)
#   vials:4 Ordered date:02/19/2015 Shipping Date:02/20/2015

## 2015-02-20 NOTE — Telephone Encounter (Signed)
#   Vials:4 Arrival Date:02/20/15 Lot #:[id number] Exp Date:5/20

## 2015-02-25 ENCOUNTER — Telehealth: Payer: Self-pay | Admitting: Internal Medicine

## 2015-02-25 NOTE — Telephone Encounter (Signed)
Done. Nothing further needed  

## 2015-02-25 NOTE — Telephone Encounter (Signed)
Spoke with [employer] rep-states infusion form (that's filled out each time patient gets injection) was not completed when faxed to them. They are missing the mg dose on form. They need the form refaxed at number listed on form. Will forward to [employer] to take care of this today. Thanks.

## 2015-02-25 NOTE — Telephone Encounter (Signed)
#   vials:4 Ordered date:02/25/15 Shipping Date:02/26/15

## 2015-02-28 ENCOUNTER — Telehealth: Payer: Self-pay | Admitting: Internal Medicine

## 2015-02-28 ENCOUNTER — Ambulatory Visit (INDEPENDENT_AMBULATORY_CARE_PROVIDER_SITE_OTHER): Payer: Medicare Other

## 2015-02-28 DIAGNOSIS — J4540 Moderate persistent asthma, uncomplicated: Secondary | ICD-10-CM | POA: Diagnosis not present

## 2015-02-28 MED ORDER — OMALIZUMAB 150 MG ~~LOC~~ SOLR
300.0000 mg | Freq: Once | SUBCUTANEOUS | Status: AC
  Administered 2015-02-28: 300 mg via SUBCUTANEOUS

## 2015-02-28 NOTE — Telephone Encounter (Signed)
#   vials:2 Ordered date:samples   Shipping Date:samples

## 2015-02-28 NOTE — Telephone Encounter (Signed)
#   Vials:4 Arrival Date:02/28/15 They arrived after I had already mixed the samples and had started giving pt. Shots. Lot #:[id number] Exp Date:4/20

## 2015-02-28 NOTE — Telephone Encounter (Signed)
#   vials:4 Ordered date:02/27/15 Shipping Date:02/27/15

## 2015-02-28 NOTE — Telephone Encounter (Signed)
#   Vials:2 Arrival Date:samples Use per Joellen [place]. Lot #:[id number]  Exp.Date:11/19,1/20

## 2015-03-06 ENCOUNTER — Telehealth: Payer: Self-pay | Admitting: Internal Medicine

## 2015-03-06 NOTE — Telephone Encounter (Signed)
Spoke with pt. She reports she received a call from [facility] late last night. She reports she gave [facility] ID#. Reports they requested all the information from her insurance card #. She spoke with Deah from a number [phone]. Pt was concerned bc they were requesting all kinds of information about her insurance. She reports they called regarding her xolair shipment. She wanted to know from [place] if this is correct [facility] calls the pt requesting all this information instead of calling our office. Please advise Laura Mendez thanks

## 2015-03-06 NOTE — Telephone Encounter (Signed)
Called [facility] to see why they called patient about Xolair as she gets her meds through [facility]. [facility] states Rx was verbally called to them on 02-27-15; Al the pharmacist is aware to cancel the RX and nothing more is needed from [facility] at this time.    Left message for patient informing her of the above; told to call the office if any further questions/concerns.

## 2015-03-14 ENCOUNTER — Ambulatory Visit (INDEPENDENT_AMBULATORY_CARE_PROVIDER_SITE_OTHER): Payer: Medicare Other

## 2015-03-14 DIAGNOSIS — J4540 Moderate persistent asthma, uncomplicated: Secondary | ICD-10-CM

## 2015-03-14 MED ORDER — OMALIZUMAB 150 MG ~~LOC~~ SOLR
300.0000 mg | Freq: Once | SUBCUTANEOUS | Status: AC
  Administered 2015-03-14: 300 mg via SUBCUTANEOUS

## 2015-03-17 ENCOUNTER — Other Ambulatory Visit: Payer: Self-pay

## 2015-03-17 ENCOUNTER — Ambulatory Visit: Payer: Medicare Other

## 2015-03-17 DIAGNOSIS — Z1231 Encounter for screening mammogram for malignant neoplasm of breast: Secondary | ICD-10-CM

## 2015-03-28 ENCOUNTER — Ambulatory Visit (INDEPENDENT_AMBULATORY_CARE_PROVIDER_SITE_OTHER): Payer: Medicare Other

## 2015-03-28 DIAGNOSIS — J4540 Moderate persistent asthma, uncomplicated: Secondary | ICD-10-CM | POA: Diagnosis not present

## 2015-03-28 MED ORDER — OMALIZUMAB 150 MG ~~LOC~~ SOLR
300.0000 mg | Freq: Once | SUBCUTANEOUS | Status: AC
  Administered 2015-03-28: 300 mg via SUBCUTANEOUS

## 2015-04-09 ENCOUNTER — Telehealth: Payer: Self-pay

## 2015-04-09 NOTE — Telephone Encounter (Signed)
xolair order was never documented at shipment.  # Vials:4 Arrival Date:04/09/2015 Lot [id number]

## 2015-04-11 ENCOUNTER — Ambulatory Visit (INDEPENDENT_AMBULATORY_CARE_PROVIDER_SITE_OTHER): Payer: Medicare Other

## 2015-04-11 DIAGNOSIS — J4540 Moderate persistent asthma, uncomplicated: Secondary | ICD-10-CM | POA: Diagnosis not present

## 2015-04-11 MED ORDER — OMALIZUMAB 150 MG ~~LOC~~ SOLR
300.0000 mg | SUBCUTANEOUS | Status: DC
  Administered 2015-04-11: 300 mg via SUBCUTANEOUS

## 2015-04-25 ENCOUNTER — Ambulatory Visit (INDEPENDENT_AMBULATORY_CARE_PROVIDER_SITE_OTHER): Payer: Medicare Other

## 2015-04-25 DIAGNOSIS — J4540 Moderate persistent asthma, uncomplicated: Secondary | ICD-10-CM | POA: Diagnosis not present

## 2015-04-25 MED ORDER — OMALIZUMAB 150 MG ~~LOC~~ SOLR
300.0000 mg | Freq: Once | SUBCUTANEOUS | Status: AC
  Administered 2015-04-25: 300 mg via SUBCUTANEOUS

## 2015-04-28 ENCOUNTER — Ambulatory Visit
Admission: RE | Admit: 2015-04-28 | Discharge: 2015-04-28 | Disposition: A | Discharge status: Home / Self Care | Payer: Medicare Other | Source: Ambulatory Visit

## 2015-04-28 DIAGNOSIS — Z1231 Encounter for screening mammogram for malignant neoplasm of breast: Secondary | ICD-10-CM

## 2015-04-28 IMAGING — MG MM SCREEN MAMMOGRAM BILATERAL
4 series · 4 of 4 positions shown · non-contrast
Comparison: Previous exam(s).

CLINICAL DATA: Screening.

EXAM:
DIGITAL SCREENING BILATERAL MAMMOGRAM WITH CAD

[R CC]
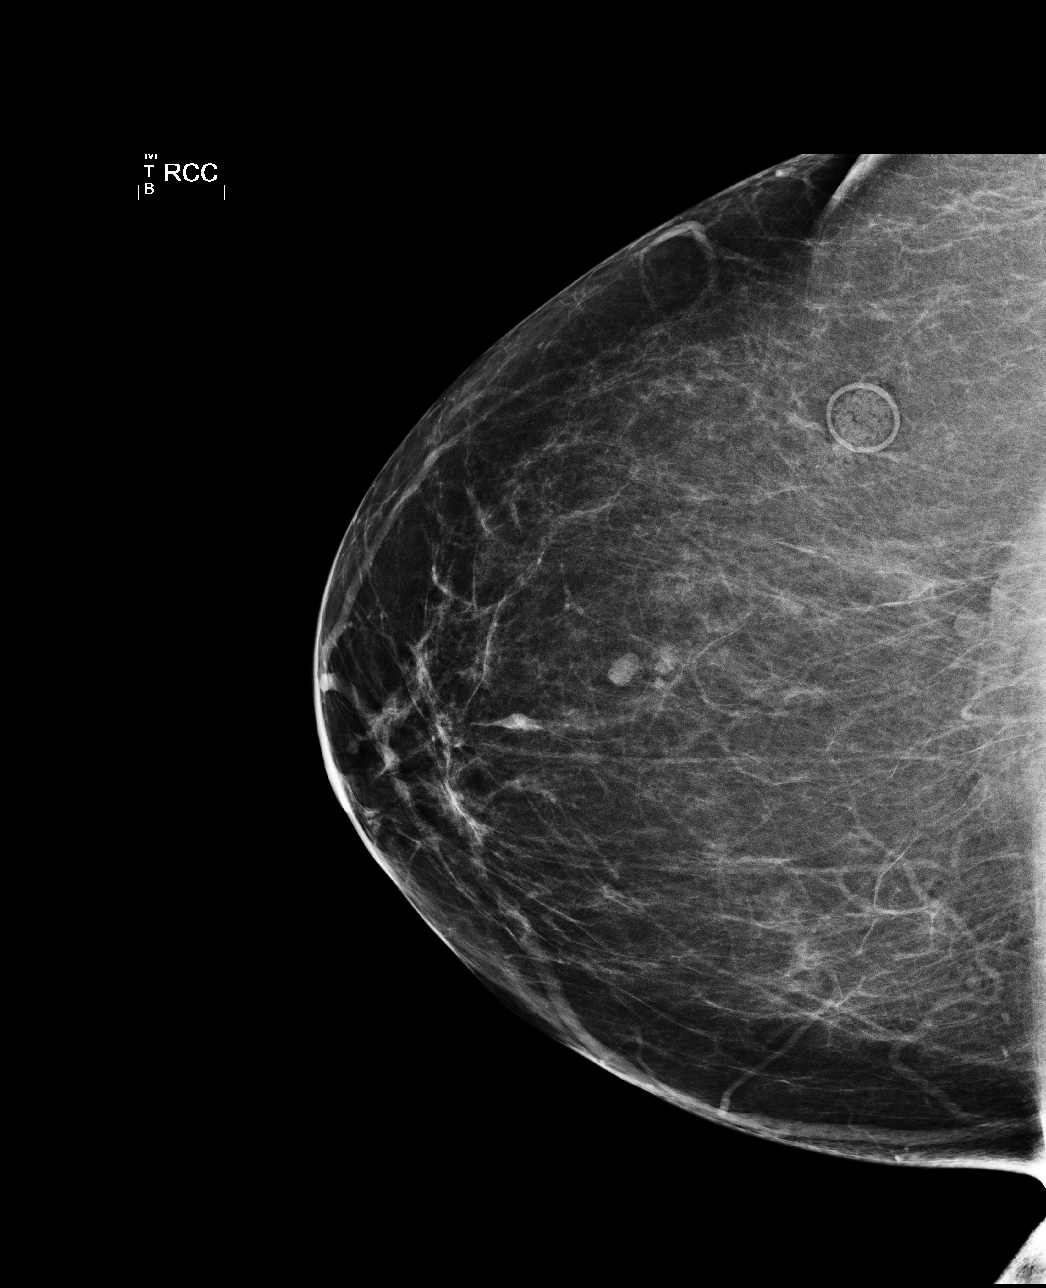

[L CC]
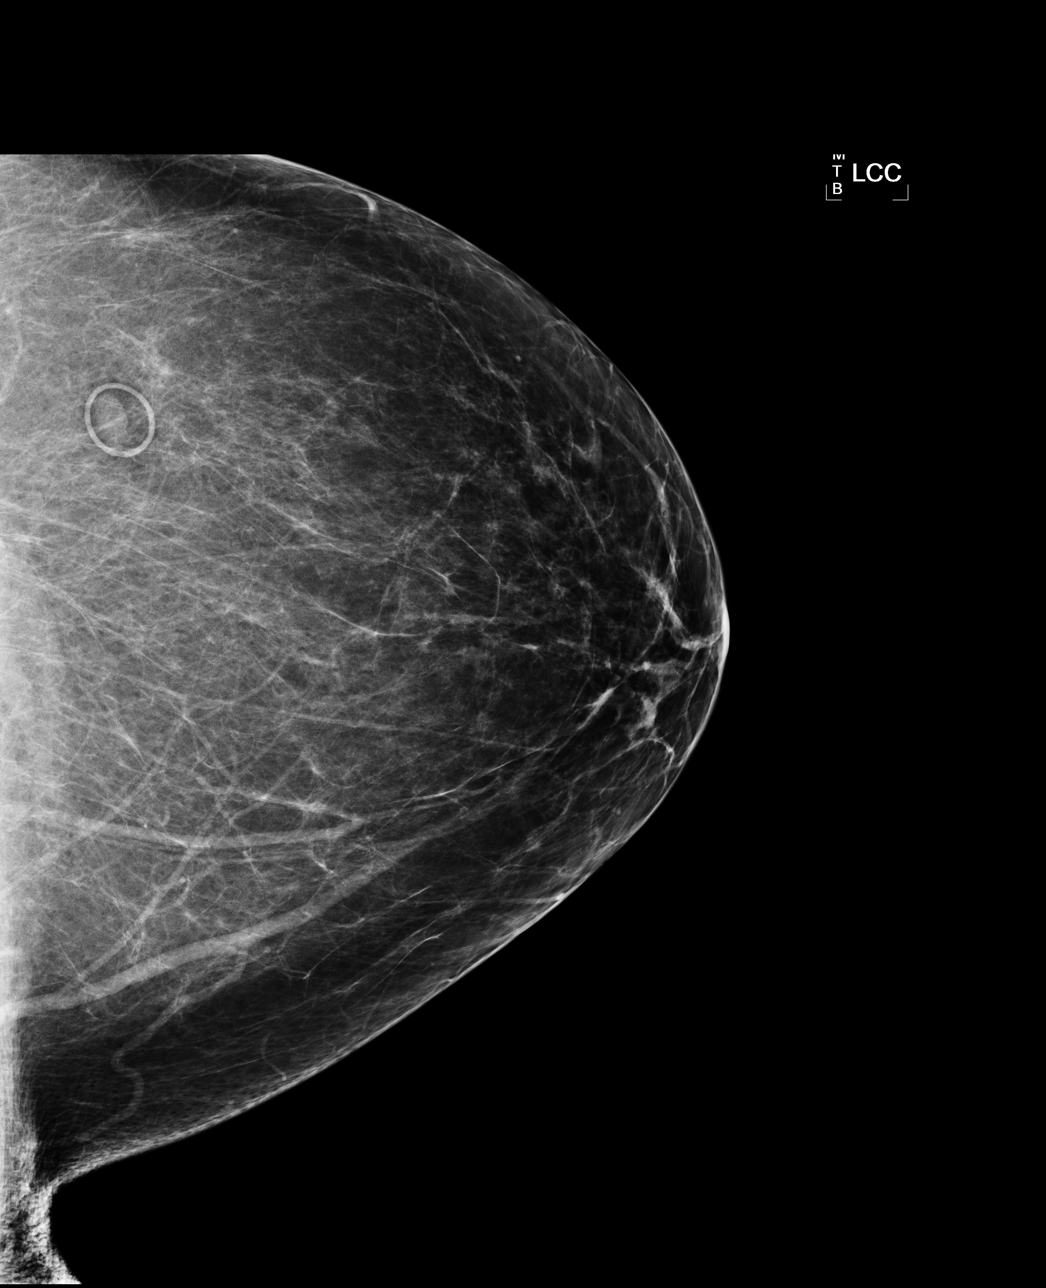

[L MLO]
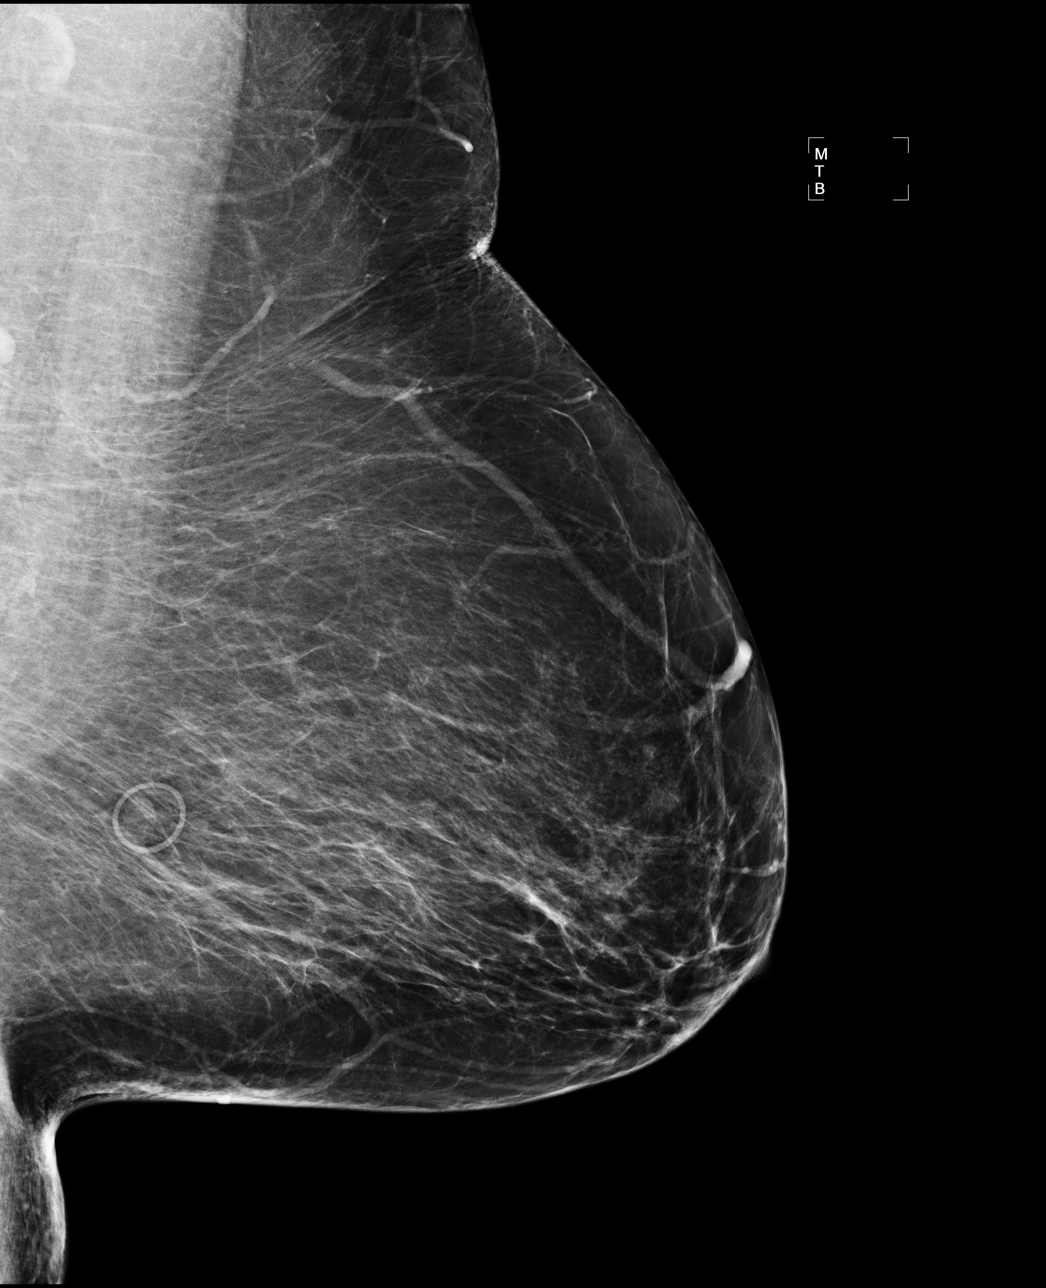

[R MLO]
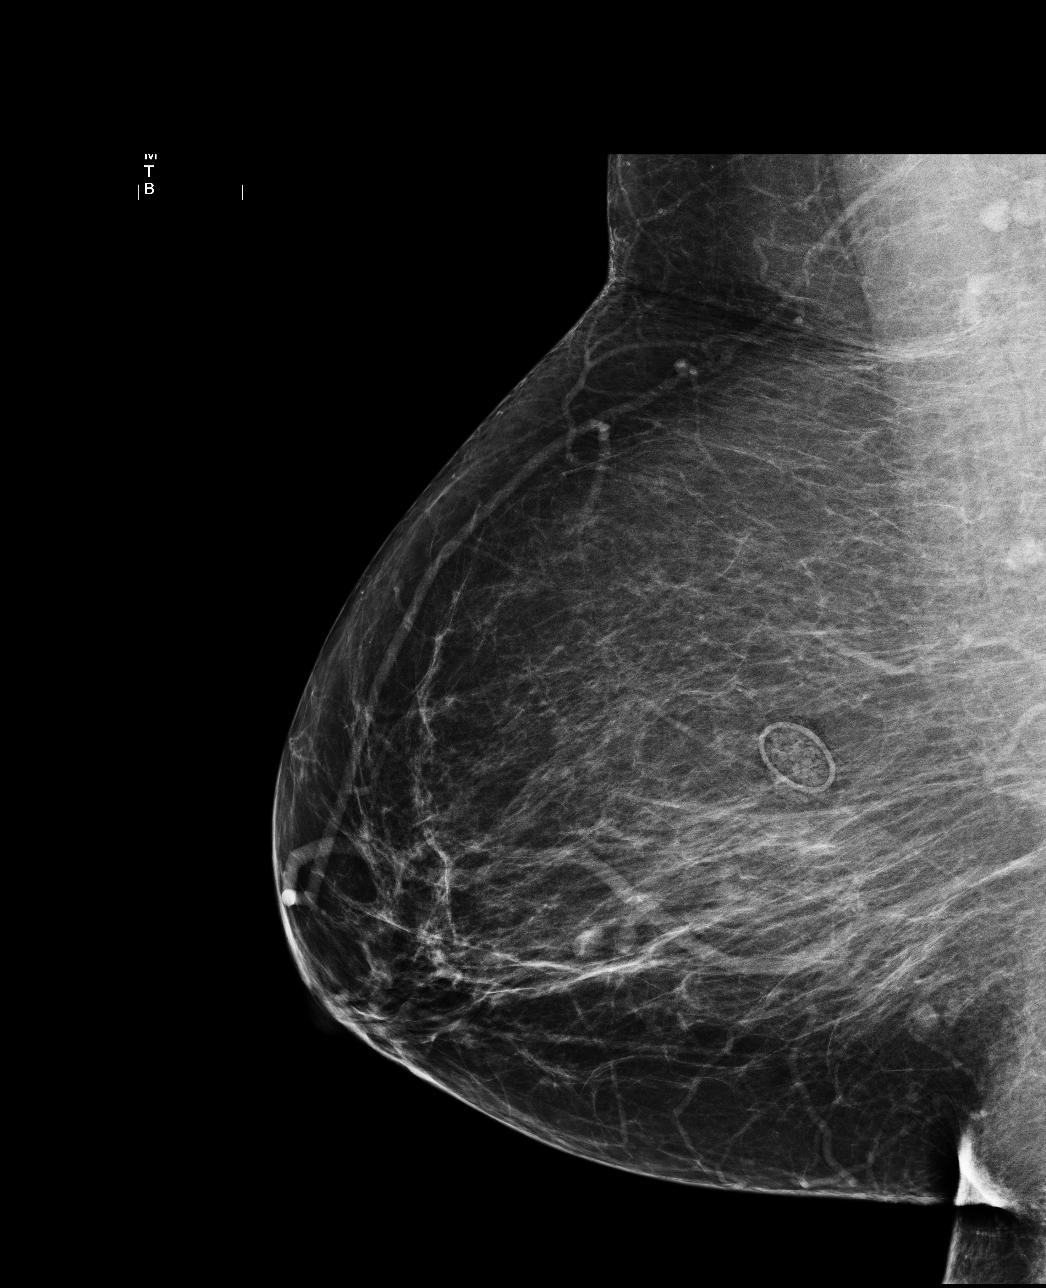

[4 of 4 positions shown; findings below may reference images not displayed]

ACR Breast Density Category b: There are scattered areas of
fibroglandular density.
FINDINGS: There are no findings suspicious for malignancy. Images were
processed with CAD.
IMPRESSION: No mammographic evidence of malignancy. A result letter of this
screening mammogram will be mailed directly to the patient.

RECOMMENDATION:
Screening mammogram in one year. (Code:[US])

BI-RADS CATEGORY  1: Negative.

## 2015-04-30 ENCOUNTER — Telehealth: Payer: Self-pay | Admitting: Internal Medicine

## 2015-04-30 NOTE — Telephone Encounter (Signed)
#   vials:4 Ordered date:04/30/15 Shipping Date:04/30/15 

## 2015-05-01 NOTE — Telephone Encounter (Signed)
#   Vials:4 Arrival Date:05/01/15 Lot #:[id number] Exp Date:6/20  

## 2015-05-08 ENCOUNTER — Ambulatory Visit (INDEPENDENT_AMBULATORY_CARE_PROVIDER_SITE_OTHER): Payer: Medicare Other | Admitting: Internal Medicine

## 2015-05-08 ENCOUNTER — Encounter: Payer: Self-pay | Admitting: Internal Medicine

## 2015-05-08 VITALS — BP 124/74 | HR 71 | Ht 70.0 in | Wt 201.0 lb

## 2015-05-08 DIAGNOSIS — J449 Chronic obstructive pulmonary disease, unspecified: Secondary | ICD-10-CM | POA: Diagnosis not present

## 2015-05-08 DIAGNOSIS — J45909 Unspecified asthma, uncomplicated: Secondary | ICD-10-CM | POA: Diagnosis not present

## 2015-05-08 MED ORDER — ALBUTEROL SULFATE HFA 108 (90 BASE) MCG/ACT IN AERS: INHALATION_SPRAY | RESPIRATORY_TRACT | Status: DC

## 2015-05-08 NOTE — Progress Notes (Signed)
Patient ID: Laura Mendez, female    DOB: 08-12-1940, 74 y.o.   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 Wants flu shot.  She remains pleased with the difference she credits to [place]. She stayed in this summer to avoid heat. Noticing some early fall nasal congestion. Has needed to use her rescue inhaler a couple of times and to take Zyrtec, blaming fall season. 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  08/02/11-  56 yoF former smoker followed for severe asthma/ COPD FEV1 47%, Xolair Very complimentary of our office managers assistance with some administrative problems with her Xolair. She is very strongly convinced that [place] keeps her breathing problems under control and describes increasing chest congestion when she had be off for a couple of doses. Got through the winter with a few minor colds which she show cough. Now walking one hour per day. Prefers Ventolin rescue inhaler over other brands she has tried. Recent mild nasal stuffiness despite Xolair. Occasional rhinorrhea is helped by Zyrtec.  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 Plans flu vaccine later  08/04/12- 71 yoF former smoker followed for severe asthma/ COPD FEV1 47%, Xolair FOLLOWS FOR: still on Xolair; Denies SOB, wheezing, cough, or congestion. Very good winter. Realizes value of avoiding sick contacts.  Had bad dry-skin trunk itching during winter, relieved by stopping Zyrtec. Now a little seasonal nasal drip.  02/09/13- 43 yoF former smoker followed for severe asthma/ COPD FEV1 47%, Xolair FOLLOWS FOR: continues Xolair and doing great with it; no flare ups at this time Discussed on-going Xolair therapy. Uses Advair 1xday, occ rescue  HFA Discussed flu vax.  09/28/13- 24 yoF former smoker followed for severe asthma/ COPD FEV1 47%, Xolair FOLLOWS FOR:  Doing well on Xolair injections.  No concerns today Says again that Xolair makes a "huge difference", but concerned how long she can handle the cost.  On rare occasions when she has been late by a few days, she feels chest tightening and wheeze starting to come back.No chest pain, productive cough or discolored sputum. Today feels well.  CXR 02/12/13 IMPRESSION:  1. No acute cardiopulmonary disease.  2. Grossly unchanged findings of lung hyperexpansion, bronchitic  change and bibasilar subsegmental atelectasis/scar.  3. Unchanged prominence of the central pulmonary vasculature as  demonstrated on remote chest CT performed 07/2006.  Original Report Authenticated By: Jake Seats, MD  05/03/14- 42 yoF former smoker followed for severe asthma/ COPD FEV1 47%, Xolair FOLLOWS FOR: Still on Xolair and doing well overall. Started having a sore throat today. Minimal upper respiratory symptoms that might turn into a cold over the next day or so. Because of the holiday we discussed putting her holds an antibiotic prescription but I emphasized not to take it too early. Continues to give great credit Xolair injections although she gets frustrated by the financing.  11/04/14-  74 yoF former smoker followed for severe asthma/ COPD FEV1 47%, Xolair FOLLOWS FOR: Still on Xolair, this is still working well. Ventolin is not covered on her formulary, will need an alternative. She is concerned about cost of medications and says she feels so well she is afraid to make changes. In particular she credits Xolair with "  saving my life" and is concerned about potential loss of financial coverage.  05/08/15- 68 yoF former smoker followed for severe asthma/ COPD FEV1 47%, Xolair Follows for: Asthma. Pt states that she is doing well. Pt reports some nasal congestion recently with occasional cough. Pt denies  wheeze/SOB/CP/tightness.  Remains convinced Xolair has changed her life. Now getting injections every 2 weeks after finding every 3 weeks was associated with increasing chest congestion. Uses rescue inhaler occasionally and says her experience is that Ventolin HFA works much better than pro air. Had pneumonia vaccine-Prevnar CXR 11/04/2014 FINDINGS: Cardiac shadow is stable. Stable mitral annular calcifications are seen. Scarring is noted in the bases bilaterally but stable from theA prior study. No focal infiltrate or sizable effusion is seen. No bony abnormality is noted. IMPRESSION: Stable bibasilar scarring. Electronically Signed  By: Inez Catalina M.D.  On: 11/04/2014 10:21  Review of Systems-see HPI Constitutional:   No-   weight loss, night sweats, fevers, chills, fatigue, lassitude. HEENT:   No-  headaches, difficulty swallowing, tooth/dental problems,+ sore throat,       No-  sneezing, itching, ear ache,  nasal congestion, +post nasal drip,  CV:  No-   chest pain, orthopnea, PND, swelling in lower extremities, anasarca, dizziness, palpitations Resp:  +Some shortness of breath with exertion or at rest- unchanged             No-   productive cough,  No non-productive cough,  No-  coughing up of blood.              No-   change in color of mucus.  No- wheezing.   Skin: No-   rash or lesions. GI:  No-   heartburn, indigestion, abdominal pain, nausea, vomiting, GU:  MS:  No-   joint pain or swelling.   Neuro- nothing unusual  Psych:  No- change in mood or affect. No depression or anxiety.  No memory loss  Objective:     General- Alert, Oriented, Affect-appropriate, Distress- none acute, cheerful Skin- rash-none, lesions- none, excoriation- none. Skin is dry. Lymphadenopathy- none Head- atraumatic            Eyes- Gross vision intact, PERRLA, conjunctivae clear secretions            Ears- Hearing, canals normal            Nose- Mild turbinate edema, No-Septal dev, mucus,  polyps, erosion, perforation             Throat- Mallampati II , mucosa -not red, drainage- none, tonsils- atrophic Neck- flexible , trachea midline, no stridor , thyroid nl, carotid no bruit Chest - symmetrical excursion , unlabored           Heart/CV- RRR , no murmur , no gallop  , no rub, nl s1 s2                           - JVD- none , edema- none, stasis changes- none, varices- none           Lung- +clear to P&A- distant, wheeze- none, cough + dry, dullness-none, rub- none           Chest wall-  Abd-  Br/ Gen/ Rectal- Not done, not indicated Extrem- cyanosis- none, clubbing, none, atrophy- none, strength- nl Neuro- grossly intact to observation

## 2015-05-08 NOTE — Patient Instructions (Addendum)
Script sent for Ventolin [facility]  We can request prior authorization if necessary  We can continue Xolair  We will log that you got Prevnar 13 pneumonia vaccine in August, 2016

## 2015-05-09 ENCOUNTER — Ambulatory Visit: Payer: Medicare Other

## 2015-05-10 NOTE — Assessment & Plan Note (Signed)
She has continued faithfully with Xolair finding it makes a dramatic difference for her breathing. We discussed continuing at least another year

## 2015-05-12 ENCOUNTER — Ambulatory Visit: Payer: Medicare Other

## 2015-05-14 ENCOUNTER — Telehealth: Payer: Self-pay

## 2015-05-14 ENCOUNTER — Telehealth: Payer: Self-pay | Admitting: Internal Medicine

## 2015-05-14 NOTE — Telephone Encounter (Signed)
Pt states that her PAN funding expired 04/26/15. Pt has 4 bottles of Xolair here. She is coming in for an injection Friday, 05/16/15. Pt would like confirmation that something has been worked out for her to be able to continue to receive her Xolair injections. Pt would like to know if medication will now come from [place] and that her insurance will only be billed for the injection charge. Please call her to go over the new process so that she knows a plan is in place before she runs out of her current Xolair. Thanks!

## 2015-05-15 NOTE — Telephone Encounter (Signed)
done

## 2015-05-15 NOTE — Telephone Encounter (Signed)
These have been signed - copy placed to be scanned.  Called patient - made aware form is ready to be picked up.  Pt coming today. Nothing further needed.

## 2015-05-16 ENCOUNTER — Ambulatory Visit: Payer: Medicare Other

## 2015-05-16 NOTE — Telephone Encounter (Signed)
Spoke with patient-she is aware that [facility] has current Rx on file and her status is current for free meds.; we have scheduled shipment to arrive on Wednesday 05-21-15;Pt will come in on Thursday 05-22-15 at 10:00am.   I have updated her chart and allergy lab information.

## 2015-05-21 NOTE — Telephone Encounter (Signed)
#   Vials:4 Arrival Date:05/21/15 Lot [id number] Exp Date:6/20

## 2015-05-22 ENCOUNTER — Ambulatory Visit (INDEPENDENT_AMBULATORY_CARE_PROVIDER_SITE_OTHER): Payer: Medicare Other

## 2015-05-22 ENCOUNTER — Telehealth: Payer: Self-pay | Admitting: Internal Medicine

## 2015-05-22 DIAGNOSIS — J4540 Moderate persistent asthma, uncomplicated: Secondary | ICD-10-CM | POA: Diagnosis not present

## 2015-05-22 MED ORDER — OMALIZUMAB 150 MG ~~LOC~~ SOLR
300.0000 mg | Freq: Once | SUBCUTANEOUS | Status: AC
  Administered 2015-05-22: 300 mg via SUBCUTANEOUS

## 2015-05-22 NOTE — Telephone Encounter (Signed)
Per 12/15 OV w/ CDY:   Patient Instructions       Script sent for Ventolin HFA  We can request prior authorization if necessary  ----  Called spoke with [facility]. Was advised ventolin is not covered. Covered medication is Proair. [facility] is going to fax over form. Will await fax  Called spoke with pt. She reports the proair does not thing for her.  Will await fax.

## 2015-05-23 NOTE — Telephone Encounter (Signed)
Checked CY's look at and front office folder. Awaiting fax

## 2015-05-27 NOTE — Telephone Encounter (Signed)
Spoke with Laura Mendez at [employer]. Advised her that we have not received PA for Ventolin and needed for her to re-fax the form to [place]. She said that she did not send [place] a form because they told her to call the 800 number instead.   [facility] Rx PA line at [phone] Ventolin HFA has been approved through 05/23/16  Called [facility] to advise them that RX has been approved. Called patient and advised her that PA has been approved and is being processed by her pharmacist. Nothing further needed.

## 2015-06-06 ENCOUNTER — Ambulatory Visit (INDEPENDENT_AMBULATORY_CARE_PROVIDER_SITE_OTHER): Payer: Medicare Other

## 2015-06-06 DIAGNOSIS — J4540 Moderate persistent asthma, uncomplicated: Secondary | ICD-10-CM | POA: Diagnosis not present

## 2015-06-09 ENCOUNTER — Ambulatory Visit: Payer: Medicare Other | Admitting: Internal Medicine

## 2015-06-09 MED ORDER — OMALIZUMAB 150 MG ~~LOC~~ SOLR
300.0000 mg | Freq: Once | SUBCUTANEOUS | Status: AC
  Administered 2015-06-06: 300 mg via SUBCUTANEOUS

## 2015-06-10 ENCOUNTER — Telehealth: Payer: Self-pay | Admitting: Internal Medicine

## 2015-06-11 NOTE — Telephone Encounter (Signed)
LMTCB on home and cell number provided in chart.

## 2015-06-12 NOTE — Telephone Encounter (Signed)
Spoke with patient-she is aware that PAN has granted her funds at this time. She is aware that she will return to buy and bill; once she is out of funds then she can return to [facility] for free medication through [place]. I have updated Tammy Scott in allergy as well.

## 2015-06-12 NOTE — Telephone Encounter (Signed)
Patient called and transferred to [facility].  Opened msg in error.

## 2015-06-16 ENCOUNTER — Encounter: Payer: Self-pay | Admitting: Internal Medicine

## 2015-06-16 ENCOUNTER — Ambulatory Visit (INDEPENDENT_AMBULATORY_CARE_PROVIDER_SITE_OTHER): Payer: Medicare Other | Admitting: Internal Medicine

## 2015-06-16 VITALS — BP 150/90 | HR 79 | Temp 98.1°F | Resp 16 | Ht 70.5 in | Wt 206.0 lb

## 2015-06-16 DIAGNOSIS — H349 Unspecified retinal vascular occlusion: Secondary | ICD-10-CM | POA: Diagnosis not present

## 2015-06-16 DIAGNOSIS — M8580 Other specified disorders of bone density and structure, unspecified site: Secondary | ICD-10-CM | POA: Diagnosis not present

## 2015-06-16 DIAGNOSIS — H348192 Central retinal vein occlusion, unspecified eye, stable: Secondary | ICD-10-CM | POA: Insufficient documentation

## 2015-06-16 NOTE — Assessment & Plan Note (Signed)
Will review records to see if this is accurate.

## 2015-06-16 NOTE — Progress Notes (Signed)
Pre visit review using our clinic review tool, if applicable. No additional management support is needed unless otherwise documented below in the visit note. 

## 2015-06-16 NOTE — Progress Notes (Signed)
   Subjective:    Patient ID: Laura Mendez, female    DOB: November 17, 1940, 75 y.o.   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 She had extensive evaluation for the cause and none could be found. She was placed on aspirin and plavix and has maintained that since that time. She would like to know if she can stop taking it since she does bruise easily on it.   PMH, [facility], social history reviewed and updated.   Review of Systems  Constitutional: Negative for fever, activity change, appetite change, fatigue and unexpected weight change.  Eyes: Negative.   Respiratory: Negative for cough, chest tightness, shortness of breath and wheezing.   Cardiovascular: Negative for chest pain, palpitations and leg swelling.  Gastrointestinal: Negative for nausea, abdominal pain, diarrhea, constipation and abdominal distention.  Musculoskeletal: Negative.   Skin: Negative.   Neurological: Negative.   Hematological: Bruises/bleeds easily.  Psychiatric/Behavioral: Negative.       Objective:   Physical Exam  Constitutional: She is oriented to person, place, and time. She appears well-developed and well-nourished.  HENT:  Head: Normocephalic and atraumatic.  Nose: Nose normal.  Mouth/Throat: Oropharynx is clear and moist.  Eyes: EOM are normal.  Neck: Normal range of motion. No JVD present. No thyromegaly present.  Cardiovascular: Normal rate and regular rhythm.   Carotids without bruit bilaterally.   Pulmonary/Chest: Effort normal and breath sounds normal. No respiratory distress. She has no wheezes.  Abdominal: Soft. Bowel sounds are normal. She exhibits no distension. There is no tenderness. There is no rebound.  Musculoskeletal: She exhibits no edema.  Neurological: She is alert and oriented to person, place, and time. Coordination normal.  Skin: Skin is warm and dry.  Psychiatric: She has  a normal mood and affect.   Filed Vitals:   06/16/15 1446  BP: 150/90  Pulse: 79  Temp: 98.1 F (36.7 C)  TempSrc: Oral  Resp: 16  Height: 5' 10.5" (1.791 m)  Weight: 206 lb (93.441 kg)  SpO2: 95%      Assessment & Plan:

## 2015-06-16 NOTE — Patient Instructions (Signed)
You can come back for the nurse visit for the tetanus shot on Monday.   Come back for the wellness in August.

## 2015-06-16 NOTE — Assessment & Plan Note (Signed)
Will get records to review and ensure that she does not need to be on dual plavix and aspirin. Generally monotherapy is safer and as effective.

## 2015-06-18 ENCOUNTER — Telehealth: Payer: Self-pay | Admitting: Internal Medicine

## 2015-06-18 NOTE — Telephone Encounter (Signed)
#   vials:4 Ordered date:06/18/15 Shipping Date:06/19/15

## 2015-06-19 NOTE — Telephone Encounter (Signed)
#   Vials:4 Arrival Date:06/19/15 Lot [id number] Exp Date:6/20

## 2015-06-20 ENCOUNTER — Ambulatory Visit (INDEPENDENT_AMBULATORY_CARE_PROVIDER_SITE_OTHER): Payer: Medicare Other

## 2015-06-20 DIAGNOSIS — J4540 Moderate persistent asthma, uncomplicated: Secondary | ICD-10-CM | POA: Diagnosis not present

## 2015-06-23 MED ORDER — OMALIZUMAB 150 MG ~~LOC~~ SOLR
300.0000 mg | Freq: Once | SUBCUTANEOUS | Status: AC
  Administered 2015-06-20: 300 mg via SUBCUTANEOUS

## 2015-06-24 ENCOUNTER — Ambulatory Visit (INDEPENDENT_AMBULATORY_CARE_PROVIDER_SITE_OTHER): Payer: Medicare Other | Admitting: General Practice

## 2015-06-24 DIAGNOSIS — Z23 Encounter for immunization: Secondary | ICD-10-CM | POA: Diagnosis not present

## 2015-07-02 ENCOUNTER — Telehealth: Payer: Self-pay | Admitting: Internal Medicine

## 2015-07-02 NOTE — Telephone Encounter (Signed)
Called and explained. She understood and will wait until the DOS

## 2015-07-02 NOTE — Telephone Encounter (Signed)
We generally do not perform labs prior as medicare does not always cover those if they are not associated with a visit. If she wants to have labs before she must understand that if they are not covered she will be responsible for the cost.

## 2015-07-02 NOTE — Telephone Encounter (Signed)
Patient is coming in for her cpe on 01/13/2016. Requesting to get her blood work done on 01/12/2016. Please enter cpe lab orders for 01/12/2016 lab encounter

## 2015-07-04 ENCOUNTER — Ambulatory Visit (INDEPENDENT_AMBULATORY_CARE_PROVIDER_SITE_OTHER): Payer: Medicare Other

## 2015-07-04 DIAGNOSIS — J4540 Moderate persistent asthma, uncomplicated: Secondary | ICD-10-CM | POA: Diagnosis not present

## 2015-07-04 MED ORDER — OMALIZUMAB 150 MG ~~LOC~~ SOLR
300.0000 mg | Freq: Once | SUBCUTANEOUS | Status: AC
  Administered 2015-07-04: 300 mg via SUBCUTANEOUS

## 2015-07-07 ENCOUNTER — Telehealth: Payer: Self-pay | Admitting: Internal Medicine

## 2015-07-07 NOTE — Telephone Encounter (Signed)
Patient Returned call [phone]

## 2015-07-07 NOTE — Telephone Encounter (Signed)
LMTCB

## 2015-07-08 NOTE — Telephone Encounter (Signed)
Patient calling again this am to speak to [place] Numbers is [phone] or [phone]

## 2015-07-09 ENCOUNTER — Telehealth: Payer: Self-pay | Admitting: Internal Medicine

## 2015-07-09 NOTE — Telephone Encounter (Signed)
ATC-no dial tone-will call back Thursday morning.

## 2015-07-09 NOTE — Telephone Encounter (Signed)
#   vials: 4 Ordered date:07/09/15 Shipping Date:07/10/15

## 2015-07-10 NOTE — Telephone Encounter (Signed)
LMTCB-need to know what questions/concerns she is having with Xolair. Please inform patient I am with a provider all day and may not be able to take her call at the moment she is asking. Thanks.

## 2015-07-10 NOTE — Telephone Encounter (Signed)
Spoke with patient-states she has concerns about her billing from [place]. Pt brought by the paperwork she received and understands that I will work with Kathlee Nations to help understand why certain charges are being sent to her.

## 2015-07-10 NOTE — Telephone Encounter (Signed)
#   Vials:4 Arrival Date:07/10/15 Lot [id number] Exp Date:11/20

## 2015-07-14 NOTE — Telephone Encounter (Signed)
Patient Returned call [phone]

## 2015-07-15 NOTE — Telephone Encounter (Signed)
Pt calling wanting a call from [facility] asap @ [phone] or cell @ [phone] this is concerning her xolair.Laura Mendez

## 2015-07-15 NOTE — Telephone Encounter (Signed)
[name] was made aware last week that I am not always available when she calls; informed patient on voicemail that I will not be in the office 1:00pm to 2:00pm today. Laura Mendez is helping with patients case. Thanks.

## 2015-07-15 NOTE — Telephone Encounter (Signed)
Pt returning call to get update on her issue, I did let her know that you were with a doc today and that you had liz trying to help with her issue she can be reached @ same #'s.Laura Mendez

## 2015-07-16 NOTE — Telephone Encounter (Signed)
Kathlee Nations is contacting patient today to give updates.

## 2015-07-17 NOTE — Telephone Encounter (Signed)
Kathlee Nations is handling this matter and can close phone note out.

## 2015-07-18 ENCOUNTER — Ambulatory Visit (INDEPENDENT_AMBULATORY_CARE_PROVIDER_SITE_OTHER): Payer: Medicare Other

## 2015-07-18 DIAGNOSIS — J4540 Moderate persistent asthma, uncomplicated: Secondary | ICD-10-CM | POA: Diagnosis not present

## 2015-07-18 MED ORDER — OMALIZUMAB 150 MG ~~LOC~~ SOLR
300.0000 mg | Freq: Once | SUBCUTANEOUS | Status: AC
  Administered 2015-07-18: 300 mg via SUBCUTANEOUS

## 2015-08-01 ENCOUNTER — Ambulatory Visit (INDEPENDENT_AMBULATORY_CARE_PROVIDER_SITE_OTHER): Payer: Medicare Other

## 2015-08-01 DIAGNOSIS — J4540 Moderate persistent asthma, uncomplicated: Secondary | ICD-10-CM

## 2015-08-01 MED ORDER — OMALIZUMAB 150 MG ~~LOC~~ SOLR
300.0000 mg | Freq: Once | SUBCUTANEOUS | Status: AC
  Administered 2015-08-01: 300 mg via SUBCUTANEOUS

## 2015-08-07 ENCOUNTER — Telehealth: Payer: Self-pay | Admitting: Internal Medicine

## 2015-08-07 NOTE — Telephone Encounter (Signed)
#   vials:4 Ordered date:08/07/15 Shipping Date:08/08/15

## 2015-08-08 NOTE — Telephone Encounter (Signed)
#   Vials:4 Arrival Date:08/08/15 Lot [id number] Exp Date:9/20

## 2015-08-12 ENCOUNTER — Telehealth: Payer: Self-pay | Admitting: Internal Medicine

## 2015-08-12 ENCOUNTER — Ambulatory Visit (INDEPENDENT_AMBULATORY_CARE_PROVIDER_SITE_OTHER): Payer: Medicare Other | Admitting: Internal Medicine

## 2015-08-12 VITALS — BP 140/88 | HR 90 | Temp 98.5°F | Resp 20 | Wt 205.0 lb

## 2015-08-12 DIAGNOSIS — R079 Chest pain, unspecified: Secondary | ICD-10-CM | POA: Diagnosis not present

## 2015-08-12 DIAGNOSIS — J449 Chronic obstructive pulmonary disease, unspecified: Secondary | ICD-10-CM

## 2015-08-12 DIAGNOSIS — J45909 Unspecified asthma, uncomplicated: Secondary | ICD-10-CM

## 2015-08-12 DIAGNOSIS — R002 Palpitations: Secondary | ICD-10-CM | POA: Insufficient documentation

## 2015-08-12 NOTE — Patient Instructions (Signed)
Your EKG was [facility] today  Please continue all other medications as before, and refills have been done if requested.  Please have the pharmacy call with any other refills you may need.  Please keep your appointments with your specialists as you may have planned  You will be contacted regarding the referral for: Cardiac Event monitor   Please go to the LAB in the Basement (turn left off the elevator) for the tests to be done tomorrow  You will be contacted by phone if any changes need to be made immediately.  Otherwise, you will receive a letter about your results with an explanation, but please check with MyChart first.  Please remember to sign up for MyChart if you have not done so, as this will be important to you in the future with finding out test results, communicating by private email, and scheduling acute appointments online when needed.

## 2015-08-12 NOTE — Telephone Encounter (Signed)
[facility]     Patient Name: Laura Mendez Initial Comment Caller states has fluttering in chest above breasts and can feel it with her hand  DOB: Jan 13, 1941      Nurse Assessment  Nurse: Luther Parody, RN, Malachy Mood Date/Time (Eastern Time): 08/12/2015 3:55:35 PM  Confirm and document reason for call. If symptomatic, describe symptoms. You must click the next button to save text entered. ---Caller states that she has been having an intermittent flutter in her chest for the last wk. Denies cp, sob or any other s/s.  Has the patient traveled out of the country within the last 30 days? ---Not Applicable  Does the patient have any new or worsening symptoms? ---Yes  Will a triage be completed? ---Yes  Related visit to physician within the last 2 weeks? ---No  Does the PT have any chronic conditions? (i.e. diabetes, asthma, etc.) ---Yes  List chronic conditions. ---asthma, eye problems  Is this a behavioral health or substance abuse call? ---No    Guidelines     Guideline Title Affirmed Question Affirmed Notes   Heart Rate and Heartbeat Questions [1] Skipped or extra beat(s) AND [2] occurs 4 or more times per minute    Final Disposition User   See Physician within 4 Hours (or PCP triage) Luther Parody, RN, Cheryl     Referrals   REFERRED TO PCP OFFICE   Disagree/Comply: Leta Baptist

## 2015-08-12 NOTE — Assessment & Plan Note (Addendum)
ECG reviewed as per emr, ,etiology unclear except cannot r/o afib or SVT, for cardiac event monitor,  to f/u any worsening symptoms or concerns, consider echo, but also for TSH as well

## 2015-08-12 NOTE — Progress Notes (Signed)
Subjective:    Patient ID: Laura Mendez, female    DOB: 07-Feb-1941, 75 y.o.   MRN: [id number]  HPI  Here to f/u with c/o 1 wk onset of 3 times this wk episodes of fluttering feeling in the chest without heart racing however, starts suddenly, ends same way, last several minutes each time, not assoc with other increased sob or doe, wheezing, orthopnea, PND, increased LE swelling, palpitations, dizziness or syncope.  Has not had this before, no hx of arrythmia or afib or SVT.  Does have other lower mid chest discomfort intermittent mild she attributes to GI related as always seems better with TUMS.  Does [employer] exercise several times per wk without aggrevation of symptoms.  Nothing seems to make better or worse. 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  Had thyroid check per endo one yr ago.  Denies hyper or hypo thyroid symptoms such as voice, skin or hair change. Past Medical History  Diagnosis Date  . Chronic airway obstruction, not elsewhere classified   . Disorder of bone and cartilage, unspecified   . Unspecified asthma(493.90)   . Routine general medical examination at a health care facility    Past Surgical History  Procedure Laterality Date  . Abdominal hysterectomy      reports that she quit smoking about 38 years ago. She does not have any smokeless tobacco history on file. Her alcohol and drug histories are not on file. family history includes COPD in her father; Cancer in her mother; Kidney cancer in her sister; Liver disease in her sister; Lung cancer in her sister; Melanoma in her sister; Ovarian cancer in her sister. Allergies  Allergen Reactions  . Clarithromycin   . Codeine Phosphate     REACTION: nausea  . Ibuprofen    Current Outpatient Prescriptions on File Prior to Visit  Medication Sig Dispense Refill  . albuterol (VENTOLIN HFA) 108 (90 BASE) MCG/ACT inhaler 2 puffs every four hours as needed- rescue 18 Inhaler 12  . aspirin 81 MG chewable  tablet Chew 81 mg by mouth daily.    . Calcium Carbonate-Vitamin D (CALCIUM 500/D) 500-125 MG-UNIT TABS Take 1 tablet by mouth daily. Reported on 06/16/2015    . clopidogrel (PLAVIX) 75 MG tablet Take 1 tablet by mouth daily.    . fish oil-omega-3 fatty acids 1000 MG capsule Take 2 g by mouth daily.      . Fluticasone-Salmeterol (ADVAIR DISKUS) 250-50 MCG/DOSE AEPB INHALE 1 PUFF TWICE DAILY, RINSE AFTER USE 2 each 3  . Multiple Vitamin (MULTIVITAMIN) tablet Take by mouth daily.     Marland Kitchen omalizumab (XOLAIR) 150 MG injection Inject 300 mg into the skin every 14 (fourteen) days.       No current facility-administered medications on file prior to visit.   Review of Systems  Constitutional: Negative for unusual diaphoresis or night sweats HENT: Negative for ringing in ear or discharge Eyes: Negative for double vision or worsening visual disturbance.  Respiratory: Negative for choking and stridor.   Gastrointestinal: Negative for vomiting or other signifcant bowel change Genitourinary: Negative for hematuria or change in urine volume.  Musculoskeletal: Negative for other MSK pain or swelling Skin: Negative for color change and worsening wound.  Neurological: Negative for tremors and numbness other than noted  Psychiatric/Behavioral: Negative for decreased concentration or agitation other than above       Objective:   Physical Exam BP 140/88 mmHg  Pulse 90  Temp(Src) 98.5 F (36.9 C) (  Oral)  Resp 20  Wt 205 lb (92.987 kg)  SpO2 96% VS noted,  Constitutional: Pt appears in no significant distress HENT: Head: NCAT.  Right Ear: External ear normal.  Left Ear: External ear normal.  Eyes: . Pupils are equal, round, and reactive to light. Conjunctivae and EOM are normal Neck: Normal range of motion. Neck supple.  Cardiovascular: Normal rate and regular rhythm.   Pulmonary/Chest: Effort normal and breath sounds without rales or wheezing.  Abd:  Soft, NT, ND, + BS Neurological: Pt is alert.  Not confused , motor grossly intact Skin: Skin is warm. No rash, no LE edema Psychiatric: Pt behavior is normal. No agitation.   ECG reviewed as per emr    Assessment & Plan:

## 2015-08-13 ENCOUNTER — Other Ambulatory Visit (INDEPENDENT_AMBULATORY_CARE_PROVIDER_SITE_OTHER): Payer: Medicare Other

## 2015-08-13 ENCOUNTER — Other Ambulatory Visit: Payer: Self-pay | Admitting: Internal Medicine

## 2015-08-13 ENCOUNTER — Encounter: Payer: Self-pay | Admitting: Internal Medicine

## 2015-08-13 DIAGNOSIS — R7989 Other specified abnormal findings of blood chemistry: Secondary | ICD-10-CM | POA: Diagnosis not present

## 2015-08-13 DIAGNOSIS — R079 Chest pain, unspecified: Secondary | ICD-10-CM | POA: Insufficient documentation

## 2015-08-13 DIAGNOSIS — R002 Palpitations: Secondary | ICD-10-CM | POA: Diagnosis not present

## 2015-08-13 LAB — CBC WITH DIFFERENTIAL/PLATELET
BASOS PCT: 0.7 % (ref 0.0–3.0)
Basophils Absolute: 0 10*3/uL (ref 0.0–0.1)
EOS ABS: 0.2 10*3/uL (ref 0.0–0.7)
Eosinophils Relative: 2.8 % (ref 0.0–5.0)
HCT: 42.6 % (ref 36.0–46.0)
Hemoglobin: 14.3 g/dL (ref 12.0–15.0)
LYMPHS ABS: 2.2 10*3/uL (ref 0.7–4.0)
Lymphocytes Relative: 30.3 % (ref 12.0–46.0)
MCHC: 33.5 g/dL (ref 30.0–36.0)
MCV: 88.7 fl (ref 78.0–100.0)
MONO ABS: 0.7 10*3/uL (ref 0.1–1.0)
Monocytes Relative: 9.1 % (ref 3.0–12.0)
NEUTROS ABS: 4.1 10*3/uL (ref 1.4–7.7)
Neutrophils Relative %: 57.1 % (ref 43.0–77.0)
PLATELETS: 297 10*3/uL (ref 150.0–400.0)
RBC: 4.8 Mil/uL (ref 3.87–5.11)
RDW: 13.7 % (ref 11.5–15.5)
WBC: 7.3 10*3/uL (ref 4.0–10.5)

## 2015-08-13 LAB — LIPID PANEL
CHOL/HDL RATIO: 6
CHOLESTEROL: 199 mg/dL (ref 0–200)
HDL: 35.8 mg/dL — ABNORMAL LOW (ref >39.00)
NonHDL: 163.28
Triglycerides: 264 mg/dL — ABNORMAL HIGH (ref 0.0–149.0)
VLDL: 52.8 mg/dL — ABNORMAL HIGH (ref 0.0–40.0)

## 2015-08-13 LAB — URINALYSIS, ROUTINE W REFLEX MICROSCOPIC
Bilirubin Urine: NEGATIVE
Hgb urine dipstick: NEGATIVE
KETONES UR: NEGATIVE
Leukocytes, UA: NEGATIVE
Nitrite: NEGATIVE
PH: 7 (ref 5.0–8.0)
SPECIFIC GRAVITY, URINE: 1.01 (ref 1.000–1.030)
Total Protein, Urine: NEGATIVE
URINE GLUCOSE: NEGATIVE
Urobilinogen, UA: 0.2 (ref 0.0–1.0)

## 2015-08-13 LAB — HEPATIC FUNCTION PANEL
ALK PHOS: 58 U/L (ref 39–117)
ALT: 15 U/L (ref 0–35)
AST: 20 U/L (ref 0–37)
Albumin: 4.2 g/dL (ref 3.5–5.2)
BILIRUBIN DIRECT: 0.1 mg/dL (ref 0.0–0.3)
BILIRUBIN TOTAL: 0.6 mg/dL (ref 0.2–1.2)
Total Protein: 7.7 g/dL (ref 6.0–8.3)

## 2015-08-13 LAB — BASIC METABOLIC PANEL
BUN: 15 mg/dL (ref 6–23)
CHLORIDE: 103 meq/L (ref 96–112)
CO2: 31 mEq/L (ref 19–32)
Calcium: 10.3 mg/dL (ref 8.4–10.5)
Creatinine, Ser: 0.99 mg/dL (ref 0.40–1.20)
GFR: 58.14 mL/min — ABNORMAL LOW (ref >60.00)
Glucose, Bld: 104 mg/dL — ABNORMAL HIGH (ref 70–99)
POTASSIUM: 4.4 meq/L (ref 3.5–5.1)
SODIUM: 141 meq/L (ref 135–145)

## 2015-08-13 LAB — TSH: TSH: 4.23 u[IU]/mL (ref 0.35–4.50)

## 2015-08-13 LAB — LDL CHOLESTEROL, DIRECT: Direct LDL: 106 mg/dL

## 2015-08-13 NOTE — Assessment & Plan Note (Signed)
Atypical but always improved/resolved with TUMS, declines other stress test,  to f/u any worsening symptoms or concerns

## 2015-08-13 NOTE — Assessment & Plan Note (Signed)
stable overall by history and exam, recent data reviewed with pt, and pt to continue medical treatment as before,  to f/u any worsening symptoms or concerns SpO2 Readings from Last 3 Encounters:  08/12/15 96%  06/16/15 95%  05/08/15 98%

## 2015-08-15 ENCOUNTER — Ambulatory Visit: Payer: Medicare Other

## 2015-08-15 ENCOUNTER — Ambulatory Visit (INDEPENDENT_AMBULATORY_CARE_PROVIDER_SITE_OTHER): Payer: Medicare Other

## 2015-08-15 DIAGNOSIS — J4540 Moderate persistent asthma, uncomplicated: Secondary | ICD-10-CM

## 2015-08-15 MED ORDER — OMALIZUMAB 150 MG ~~LOC~~ SOLR
300.0000 mg | Freq: Once | SUBCUTANEOUS | Status: AC
  Administered 2015-08-15: 300 mg via SUBCUTANEOUS

## 2015-08-19 ENCOUNTER — Ambulatory Visit (INDEPENDENT_AMBULATORY_CARE_PROVIDER_SITE_OTHER): Payer: Medicare Other

## 2015-08-19 DIAGNOSIS — R002 Palpitations: Secondary | ICD-10-CM

## 2015-08-29 ENCOUNTER — Ambulatory Visit (INDEPENDENT_AMBULATORY_CARE_PROVIDER_SITE_OTHER): Payer: Medicare Other

## 2015-08-29 DIAGNOSIS — J4540 Moderate persistent asthma, uncomplicated: Secondary | ICD-10-CM

## 2015-08-29 DIAGNOSIS — J45909 Unspecified asthma, uncomplicated: Secondary | ICD-10-CM

## 2015-08-29 MED ORDER — OMALIZUMAB 150 MG ~~LOC~~ SOLR
300.0000 mg | Freq: Once | SUBCUTANEOUS | Status: AC
Start: 2015-08-29 — End: 2015-08-29
  Administered 2015-08-29: 300 mg via SUBCUTANEOUS

## 2015-09-02 ENCOUNTER — Telehealth: Payer: Self-pay | Admitting: Internal Medicine

## 2015-09-02 NOTE — Telephone Encounter (Signed)
#   vials:4 Ordered date:09/02/15 Shipping Date:4/12 or 09/04/15

## 2015-09-09 ENCOUNTER — Encounter: Payer: Self-pay | Admitting: Internal Medicine

## 2015-09-09 NOTE — Telephone Encounter (Signed)
#   Vials:4 Arrival Date:01/09/16 Lot [id number] Exp Date:11/20

## 2015-09-12 ENCOUNTER — Ambulatory Visit (INDEPENDENT_AMBULATORY_CARE_PROVIDER_SITE_OTHER): Payer: Medicare Other

## 2015-09-12 DIAGNOSIS — J4540 Moderate persistent asthma, uncomplicated: Secondary | ICD-10-CM

## 2015-09-12 MED ORDER — OMALIZUMAB 150 MG ~~LOC~~ SOLR
300.0000 mg | Freq: Once | SUBCUTANEOUS | Status: AC
  Administered 2015-09-12: 300 mg via SUBCUTANEOUS

## 2015-09-26 ENCOUNTER — Ambulatory Visit (INDEPENDENT_AMBULATORY_CARE_PROVIDER_SITE_OTHER): Payer: Medicare Other

## 2015-09-26 DIAGNOSIS — J4540 Moderate persistent asthma, uncomplicated: Secondary | ICD-10-CM | POA: Diagnosis not present

## 2015-09-29 MED ORDER — OMALIZUMAB 150 MG ~~LOC~~ SOLR
300.0000 mg | Freq: Once | SUBCUTANEOUS | Status: AC
  Administered 2015-09-29: 300 mg via SUBCUTANEOUS

## 2015-10-06 ENCOUNTER — Telehealth: Payer: Self-pay | Admitting: Internal Medicine

## 2015-10-06 NOTE — Telephone Encounter (Signed)
#   vials:4 Ordered date:10/03/15 Per Katie ok to wait. Shipping Date:10/07/15 

## 2015-10-07 NOTE — Telephone Encounter (Signed)
#   Vials:4 Arrival Date:10/07/15 Lot #[id number] Exp Date:12/20  

## 2015-10-09 ENCOUNTER — Encounter: Payer: Self-pay | Admitting: Internal Medicine

## 2015-10-10 ENCOUNTER — Ambulatory Visit (INDEPENDENT_AMBULATORY_CARE_PROVIDER_SITE_OTHER): Payer: Medicare Other

## 2015-10-10 DIAGNOSIS — J4540 Moderate persistent asthma, uncomplicated: Secondary | ICD-10-CM | POA: Diagnosis not present

## 2015-10-10 MED ORDER — OMALIZUMAB 150 MG ~~LOC~~ SOLR
300.0000 mg | Freq: Once | SUBCUTANEOUS | Status: AC
  Administered 2015-10-10: 300 mg via SUBCUTANEOUS

## 2015-10-24 ENCOUNTER — Ambulatory Visit (INDEPENDENT_AMBULATORY_CARE_PROVIDER_SITE_OTHER): Payer: Medicare Other

## 2015-10-24 DIAGNOSIS — J4540 Moderate persistent asthma, uncomplicated: Secondary | ICD-10-CM

## 2015-10-24 MED ORDER — OMALIZUMAB 150 MG ~~LOC~~ SOLR
300.0000 mg | Freq: Once | SUBCUTANEOUS | Status: AC
  Administered 2015-10-24: 300 mg via SUBCUTANEOUS

## 2015-11-03 ENCOUNTER — Telehealth: Payer: Self-pay | Admitting: Internal Medicine

## 2015-11-03 NOTE — Telephone Encounter (Signed)
#   vials:4 Ordered date:11/03/15 Shipping Date:11/04/15 

## 2015-11-04 DIAGNOSIS — D692 Other nonthrombocytopenic purpura: Secondary | ICD-10-CM | POA: Diagnosis not present

## 2015-11-04 DIAGNOSIS — L400 Psoriasis vulgaris: Secondary | ICD-10-CM | POA: Diagnosis not present

## 2015-11-05 NOTE — Telephone Encounter (Signed)
#   Vials:4 Arrival Date:11/05/15 Lot #[id number] Exp Date:12/20  

## 2015-11-06 ENCOUNTER — Ambulatory Visit (INDEPENDENT_AMBULATORY_CARE_PROVIDER_SITE_OTHER): Payer: Medicare Other | Admitting: Internal Medicine

## 2015-11-06 ENCOUNTER — Encounter: Payer: Self-pay | Admitting: Internal Medicine

## 2015-11-06 DIAGNOSIS — R21 Rash and other nonspecific skin eruption: Secondary | ICD-10-CM

## 2015-11-06 DIAGNOSIS — J449 Chronic obstructive pulmonary disease, unspecified: Secondary | ICD-10-CM

## 2015-11-06 DIAGNOSIS — J45909 Unspecified asthma, uncomplicated: Secondary | ICD-10-CM | POA: Diagnosis not present

## 2015-11-06 NOTE — Patient Instructions (Signed)
Ok to continue Xolair every 2 weeks  Try soothing, fragrance free skin moisturizer like lubriderm or Aloe  Please call as needed

## 2015-11-06 NOTE — Progress Notes (Signed)
Patient ID: Laura Mendez, female    DOB: 05/14/1941, 75 y.o.   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 Pt states that she is doing well. Pt reports some nasal congestion recently with occasional cough. Pt denies wheeze/SOB/CP/tightness.  Remains convinced Xolair has changed her life. Now getting injections every 2 weeks after finding every 3 weeks was associated with increasing chest congestion. Uses rescue inhaler occasionally and says her experience is that Ventolin HFA works much better than pro air. Had pneumonia vaccine-Prevnar CXR 11/04/2014 FINDINGS: Cardiac shadow is stable. Stable mitral annular calcifications are seen. Scarring is noted in the bases bilaterally but stable from theA prior study. No focal infiltrate or sizable effusion is seen. No bony abnormality is noted. IMPRESSION: Stable bibasilar scarring. Electronically Signed  By: Inez Catalina M.D.  On: 11/04/2014 10:21  11/06/2015-75 year old female former smoker followed for severe asthma/COPD/Xolair FOLLOWS FOR: Still on Xolair injections and doing well. Financial support for Xolair finally got worked out again. She strongly believes this medication has controlled previously severe and complicated asthma component of her chronic lung disease. Last year tried increasing to 3 week interval and had exacerbation. Feels well, paces self for exertion. Incidental problem-rash-using triamcinolone 0.1% for nonspecific rash on arm  Review of Systems-see HPI Constitutional:   No-   weight loss, night sweats, fevers, chills, fatigue, lassitude. HEENT:   No-  headaches, difficulty swallowing, tooth/dental problems,+ sore throat,       No-  sneezing, itching, ear ache,  nasal congestion, +post nasal drip,  CV:  No-   chest pain, orthopnea, PND, swelling in lower extremities,  anasarca, dizziness, palpitations Resp:  +Some shortness of breath with exertion or at rest- unchanged             No-   productive cough,  No non-productive cough,  No-  coughing up of blood.              No-   change in color of mucus.  No- wheezing.   Skin: + rash or lesions. GI:  No-   heartburn, indigestion, abdominal pain, nausea, vomiting, GU:  MS:  No-   joint pain or swelling.   Neuro- nothing unusual  Psych:  No- change in mood or affect. No depression or anxiety.  No memory loss  Objective:     General- Alert, Oriented, Affect-appropriate, Distress- none acute, cheerful Skin- +Rash on forearms present 3 weeks. Denies sun exposure. No vesicles. Little excoriation. Lymphadenopathy- none Head- atraumatic            Eyes- Gross vision intact, PERRLA, conjunctivae clear secretions            Ears- Hearing, canals normal            Nose- Mild turbinate edema, No-Septal dev, mucus, polyps, erosion, perforation             Throat- Mallampati II , mucosa -not red, drainage- none, tonsils- atrophic Neck- flexible , trachea midline, no stridor , thyroid nl, carotid no bruit Chest - symmetrical excursion , unlabored           Heart/CV- RRR , no murmur , no gallop  , no rub, nl s1 s2                           -  JVD- none , edema- none, stasis changes- none, varices- none           Lung- +clear to P&A- distant, wheeze- none, cough + dry, dullness-none, rub- none           Chest wall-  Abd-  Br/ Gen/ Rectal- Not done, not indicated Extrem- cyanosis- none, clubbing, none, atrophy- none, strength- nl Neuro- grossly intact to observation

## 2015-11-07 ENCOUNTER — Ambulatory Visit (INDEPENDENT_AMBULATORY_CARE_PROVIDER_SITE_OTHER): Payer: Medicare Other

## 2015-11-07 DIAGNOSIS — J4540 Moderate persistent asthma, uncomplicated: Secondary | ICD-10-CM | POA: Diagnosis not present

## 2015-11-07 MED ORDER — OMALIZUMAB 150 MG ~~LOC~~ SOLR
300.0000 mg | Freq: Once | SUBCUTANEOUS | Status: AC
  Administered 2015-11-07: 300 mg via SUBCUTANEOUS

## 2015-11-21 ENCOUNTER — Ambulatory Visit (INDEPENDENT_AMBULATORY_CARE_PROVIDER_SITE_OTHER): Payer: Medicare Other

## 2015-11-21 DIAGNOSIS — J4540 Moderate persistent asthma, uncomplicated: Secondary | ICD-10-CM

## 2015-11-21 MED ORDER — OMALIZUMAB 150 MG ~~LOC~~ SOLR
300.0000 mg | Freq: Once | SUBCUTANEOUS | Status: AC
Start: 2015-11-21 — End: 2015-11-21
  Administered 2015-11-21: 300 mg via SUBCUTANEOUS

## 2015-11-26 ENCOUNTER — Telehealth: Payer: Self-pay | Admitting: Internal Medicine

## 2015-11-26 NOTE — Telephone Encounter (Signed)
#   vials:4 Ordered date:11/26/15 Shipping Date:11/27/15

## 2015-11-27 NOTE — Telephone Encounter (Signed)
#   Vials:4 Arrival Date:11/27/15 Lot [id number] Exp Date:12/20

## 2015-12-05 ENCOUNTER — Telehealth: Payer: Self-pay | Admitting: Internal Medicine

## 2015-12-05 ENCOUNTER — Ambulatory Visit (INDEPENDENT_AMBULATORY_CARE_PROVIDER_SITE_OTHER): Payer: Medicare Other

## 2015-12-05 DIAGNOSIS — J4540 Moderate persistent asthma, uncomplicated: Secondary | ICD-10-CM | POA: Diagnosis not present

## 2015-12-05 MED ORDER — CLOPIDOGREL BISULFATE 75 MG PO TABS: 75.0000 mg | ORAL_TABLET | Freq: Every day | ORAL | Status: DC

## 2015-12-05 MED ORDER — OMALIZUMAB 150 MG ~~LOC~~ SOLR
300.0000 mg | Freq: Once | SUBCUTANEOUS | Status: AC
  Administered 2015-12-05: 300 mg via SUBCUTANEOUS

## 2015-12-05 NOTE — Telephone Encounter (Signed)
Refill sent in

## 2015-12-05 NOTE — Telephone Encounter (Signed)
Patient walked in to advise that she needs for clopidogrel (PLAVIX) 75 MG tablet [id number] to be filled. She states that her previous Md at [facility] wrote the script but has sold the practice. She has an appt coming up, but does not have enough to last until then,

## 2015-12-08 DIAGNOSIS — L308 Other specified dermatitis: Secondary | ICD-10-CM | POA: Diagnosis not present

## 2015-12-11 ENCOUNTER — Encounter (HOSPITAL_COMMUNITY): Payer: Self-pay | Admitting: Nurse Practitioner

## 2015-12-11 ENCOUNTER — Other Ambulatory Visit: Payer: Self-pay

## 2015-12-11 ENCOUNTER — Encounter (HOSPITAL_COMMUNITY): Payer: Self-pay | Admitting: Emergency Medicine

## 2015-12-11 ENCOUNTER — Ambulatory Visit (HOSPITAL_BASED_OUTPATIENT_CLINIC_OR_DEPARTMENT_OTHER)
Admission: RE | Admit: 2015-12-11 | Discharge: 2015-12-11 | Disposition: A | Discharge status: Home / Self Care | Payer: Medicare Other | Source: Ambulatory Visit | Attending: Nurse Practitioner | Admitting: Nurse Practitioner

## 2015-12-11 ENCOUNTER — Emergency Department (HOSPITAL_COMMUNITY): Payer: Medicare Other

## 2015-12-11 ENCOUNTER — Emergency Department (HOSPITAL_COMMUNITY)
Admission: EM | Admit: 2015-12-11 | Discharge: 2015-12-11 | Disposition: A | Discharge status: Home / Self Care | Payer: Medicare Other | Attending: Emergency Medicine | Admitting: Emergency Medicine

## 2015-12-11 VITALS — BP 140/84 | HR 68 | Ht 70.0 in | Wt 208.0 lb

## 2015-12-11 DIAGNOSIS — J45909 Unspecified asthma, uncomplicated: Secondary | ICD-10-CM | POA: Diagnosis not present

## 2015-12-11 DIAGNOSIS — Z87891 Personal history of nicotine dependence: Secondary | ICD-10-CM | POA: Diagnosis not present

## 2015-12-11 DIAGNOSIS — R079 Chest pain, unspecified: Secondary | ICD-10-CM | POA: Diagnosis not present

## 2015-12-11 DIAGNOSIS — Z7982 Long term (current) use of aspirin: Secondary | ICD-10-CM | POA: Diagnosis not present

## 2015-12-11 DIAGNOSIS — I483 Typical atrial flutter: Secondary | ICD-10-CM

## 2015-12-11 DIAGNOSIS — Z79899 Other long term (current) drug therapy: Secondary | ICD-10-CM | POA: Insufficient documentation

## 2015-12-11 DIAGNOSIS — I4892 Unspecified atrial flutter: Secondary | ICD-10-CM | POA: Insufficient documentation

## 2015-12-11 LAB — I-STAT TROPONIN, ED: Troponin i, poc: 0.02 ng/mL (ref 0.00–0.08)

## 2015-12-11 LAB — CBC
HCT: 42.7 % (ref 36.0–46.0)
Hemoglobin: 14.1 g/dL (ref 12.0–15.0)
MCH: 30.1 pg (ref 26.0–34.0)
MCHC: 33 g/dL (ref 30.0–36.0)
MCV: 91.2 fL (ref 78.0–100.0)
Platelets: 326 10*3/uL (ref 150–400)
RBC: 4.68 MIL/uL (ref 3.87–5.11)
RDW: 13.7 % (ref 11.5–15.5)
WBC: 12.1 10*3/uL — ABNORMAL HIGH (ref 4.0–10.5)

## 2015-12-11 LAB — BASIC METABOLIC PANEL
Anion gap: 8 (ref 5–15)
BUN: 17 mg/dL (ref 6–20)
CO2: 22 mmol/L (ref 22–32)
Calcium: 10.2 mg/dL (ref 8.9–10.3)
Chloride: 108 mmol/L (ref 101–111)
Creatinine, Ser: 0.78 mg/dL (ref 0.44–1.00)
GFR calc Af Amer: >60 mL/min (ref >60)
GFR calc non Af Amer: >60 mL/min (ref >60)
Glucose, Bld: 124 mg/dL — ABNORMAL HIGH (ref 65–99)
Potassium: 4.1 mmol/L (ref 3.5–5.1)
Sodium: 138 mmol/L (ref 135–145)

## 2015-12-11 LAB — TROPONIN I: Troponin I: <0.03 ng/mL (ref <0.03)

## 2015-12-11 LAB — MAGNESIUM: Magnesium: 2 mg/dL (ref 1.7–2.4)

## 2015-12-11 LAB — TSH: TSH: 2.792 u[IU]/mL (ref 0.350–4.500)

## 2015-12-11 IMAGING — DX DG CHEST 2V
2 series · 2 of 2 positions shown · non-contrast
Comparison: [DATE]

CLINICAL DATA: Chest pain radiating to the jaw

EXAM:
CHEST  2 VIEW

[chest pa]
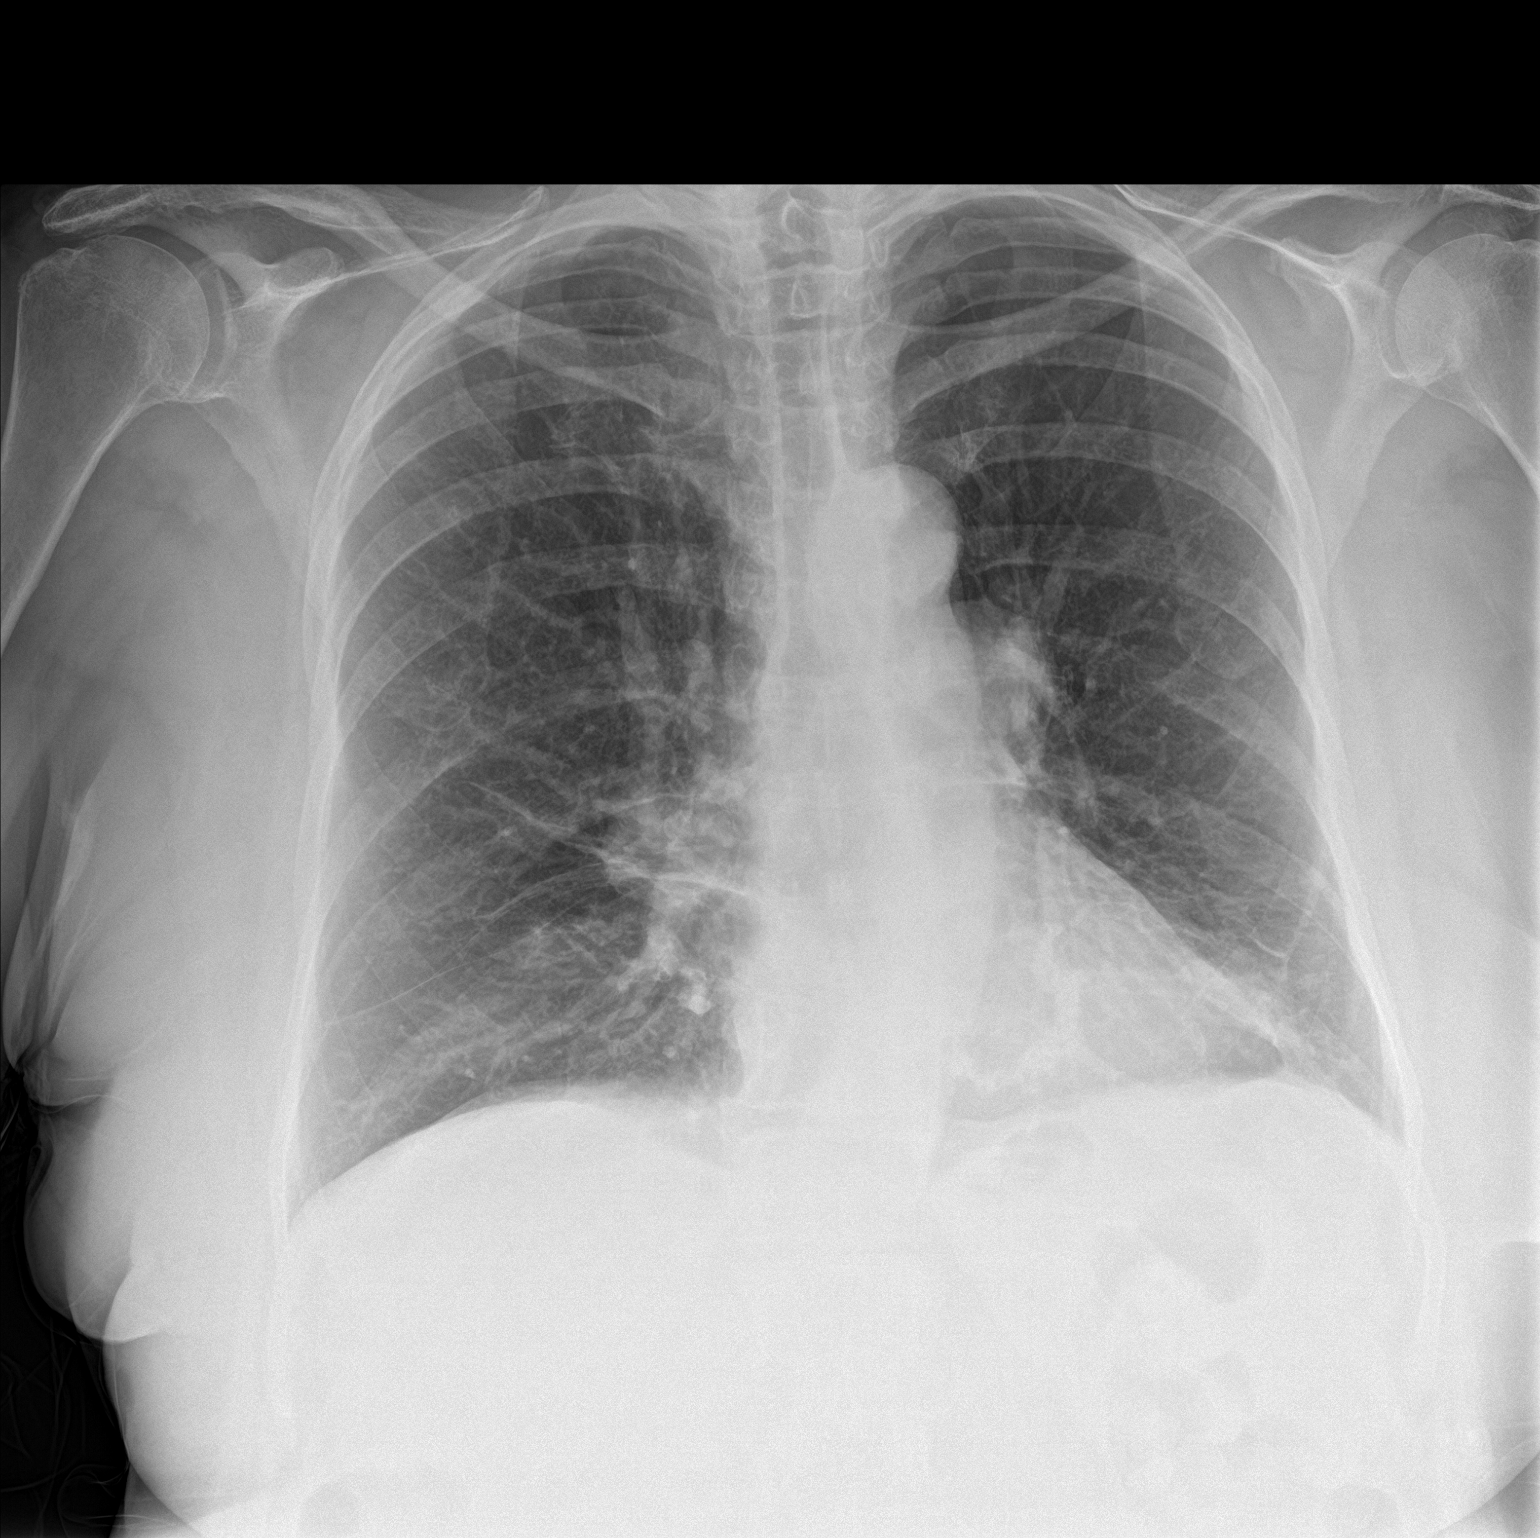

[chest lat]
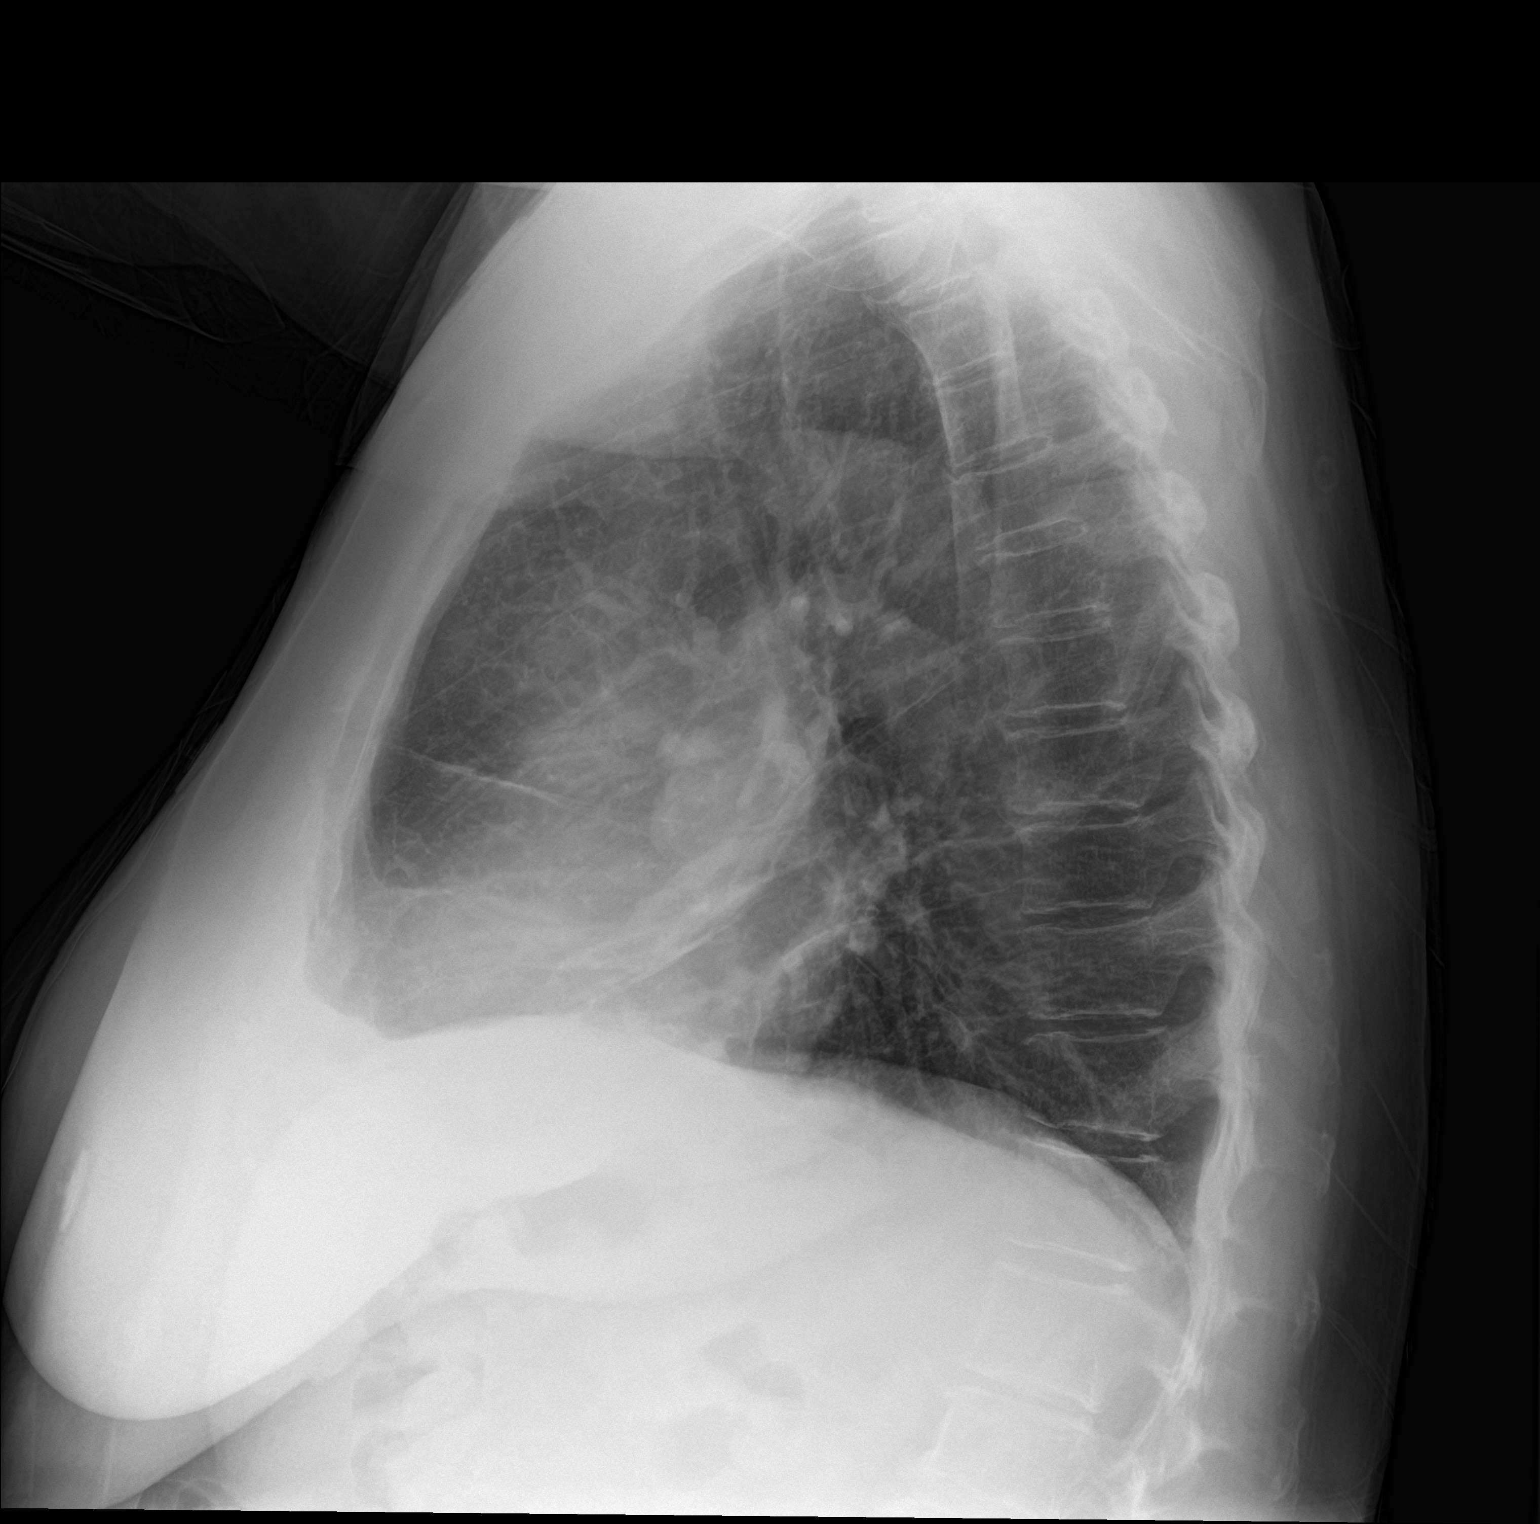

[2 of 2 positions shown; findings below may reference images not displayed]

FINDINGS: Stable basilar scarring. There is no edema, consolidation, effusion,
or pneumothorax. Normal heart size and aortic contours. Prominent
mitral annular calcification.
IMPRESSION: No acute finding.

Bibasilar scar.

## 2015-12-11 MED ORDER — METOPROLOL TARTRATE 5 MG/5ML IV SOLN
5.0000 mg | Freq: Once | INTRAVENOUS | Status: AC
  Administered 2015-12-11: 5 mg via INTRAVENOUS
  Filled 2015-12-11: qty 5

## 2015-12-11 MED ORDER — PROPOFOL 10 MG/ML IV BOLUS: 1.0000 mg/kg | Freq: Once | INTRAVENOUS | Status: DC

## 2015-12-11 MED ORDER — PROPOFOL 10 MG/ML IV BOLUS
INTRAVENOUS | Status: AC
  Filled 2015-12-11: qty 20

## 2015-12-11 MED ORDER — RIVAROXABAN 20 MG PO TABS: 20.0000 mg | ORAL_TABLET | Freq: Every day | ORAL | Status: DC

## 2015-12-11 MED ORDER — DILTIAZEM HCL ER COATED BEADS 120 MG PO CP24: 120.0000 mg | ORAL_CAPSULE | Freq: Every day | ORAL | Status: DC

## 2015-12-11 MED ORDER — FENTANYL CITRATE (PF) 100 MCG/2ML IJ SOLN
100.0000 ug | Freq: Once | INTRAMUSCULAR | Status: DC
  Filled 2015-12-11: qty 2

## 2015-12-11 MED ORDER — METOPROLOL SUCCINATE ER 25 MG PO TB24: 25.0000 mg | ORAL_TABLET | Freq: Every day | ORAL | Status: DC

## 2015-12-11 NOTE — ED Notes (Signed)
Pt. reports central chest tightness radiating to neck and left arm onset this evening , mild SOB , no nausea or diaphoresis .

## 2015-12-11 NOTE — ED Notes (Signed)
Patient verbalized understanding of discharge instructions and denies any further needs or questions at this time. VS stable. Patient ambulatory with steady gait, escorted to ED entrance in wheelchair.   

## 2015-12-11 NOTE — Discharge Instructions (Signed)
Atrial Flutter °Atrial flutter is a heart rhythm that can cause the heart to beat very fast (tachycardia). It originates in the upper chambers of the heart (atria). In atrial flutter, the top chambers of the heart (atria) often beat much faster than the bottom chambers of the heart (ventricles). Atrial flutter has a regular "saw toothed" appearance in an EKG readout. An EKG is a test that records the electrical activity of the heart. Atrial flutter can cause the heart to beat up to 150 beats per minute (BPM). Atrial flutter can either be short lived (paroxysmal) or permanent.  °CAUSES  °Causes of atrial flutter can be many. Some of these include: °· Heart related issues: °¨ Heart attack (myocardial infarction). °¨ Heart failure. °¨ Heart valve problems. °¨ Poorly controlled high blood pressure (hypertension). °¨ After open heart surgery. °· Lung related issues: °¨ A blood clot in the lungs (pulmonary embolism). °¨ Chronic obstructive pulmonary disease (COPD). Medications used to treat COPD can attribute to atrial flutter. °· Other related causes: °¨ Hyperthyroidism. °¨ Caffeine. °¨ Some decongestant cold medications. °¨ Low electrolyte levels such as potassium or magnesium. °¨ Cocaine. °SYMPTOMS °· An awareness of your heart beating rapidly (palpitations). °· Shortness of breath. °· Chest pain. °· Low blood pressure (hypotension). °· Dizziness or fainting. °DIAGNOSIS  °Different tests can be performed to diagnose atrial flutter.  °· An EKG. °· Holter monitor. This is a 24-hour recording of your heart rhythm. You will also be given a diary. Write down all symptoms that you have and what you were doing at the time you experienced symptoms. °· Cardiac event monitor. This small device can be worn for up to 30 days. When you have heart symptoms, you will push a button on the device. This will then record your heart rhythm. °· Echocardiogram. This is an imaging test to look at your heart. Your caregiver will look at your  heart valves and the ventricles. °· Stress test. This test can help determine if the atrial flutter is related to exercise or if coronary artery disease is present. °· Laboratory studies will look at certain blood levels like: °¨ Complete blood count (CBC). °¨ Potassium. °¨ Magnesium. °¨ Thyroid function. °TREATMENT  °Treatment of atrial flutter varies. A combination of therapies may be used or sometimes atrial flutter may need only 1 type of treatment.  °Lab work: °If your blood work, such as your electrolytes (potassium, magnesium) or your thyroid function tests, are abnormal, your caregiver will treat them accordingly.  °Medication:  °There are several different types of medications that can convert your heart to a normal rhythm and prevent atrial flutter from reoccurring.  °Nonsurgical procedures: °Nonsurgical techniques may be used to control atrial flutter. Some examples include: °· Cardioversion. This technique uses either drugs or an electrical shock to restore a normal heart rhythm: °¨ Cardioversion drugs may be given through an intravenous (IV) line to help "reset" the heart rhythm. °¨ In electrical cardioversion, your caregiver shocks your heart with electrical energy. This helps to reset the heartbeat to a normal rhythm. °· Ablation. If atrial flutter is a persistent problem, an ablation may be needed. This procedure is done under mild sedation. High frequency radio-wave energy is used to destroy the area of heart tissue responsible for atrial flutter. °SEEK IMMEDIATE MEDICAL CARE IF:  °You have: °· Dizziness. °· Near fainting or fainting. °· Shortness of breath. °· Chest pain or pressure. °· Sudden nausea or vomiting. °· Profuse sweating. °If you have the above symptoms,   call your local emergency service immediately! Do not drive yourself to the hospital. °MAKE SURE YOU:  °· Understand these instructions. °· Will watch your condition. °· Will get help right away if you are not doing well or get worse. °   °This information is not intended to replace advice given to you by your health care provider. Make sure you discuss any questions you have with your health care provider. °  °Document Released: 09/26/2008 Document Revised: 05/31/2014 Document Reviewed: 11/22/2014 °Elsevier Interactive Patient Education ©2016 [employer] ° °

## 2015-12-11 NOTE — Patient Instructions (Addendum)
Your physician has recommended you make the following change in your medication:  1)Stop metoprolol (toprol) 2)Start Cardizem CD 120mg  once a day 3)Start Xarelto 20mg  once a day with supper 4)Stop Plavix

## 2015-12-11 NOTE — Progress Notes (Signed)
Patient ID: Laura Mendez, female   DOB: 1940-10-25, 75 y.o.   MRN: [id number]     Primary Care Physician: Laura Koch, MD Referring Physician: ER f/u   Laura Mendez is a 75 y.o. female with a h/o new onset a flutter, 150 bpm, that was associated with left chest and left arm pain, dx in the ER last night with f/u in the afib clinic today. She was laying in bed reading a romance novel when her left sided CP began. Pt states that she has an intermittent cramping sensation to her chest that radiates to her left arm.  Pt notes that she was recently placed on prednisone taper and received steriod shot  for a rash of unknown etiology to her arms and trunk and she is mid way in her taper. Rx. Pt reports that she has been evaluated by two dermatologists for her rash. Denies any new supplements or caffeine use. She states that she is having associated symptoms of palpitations. Pt notes that she has had a flutter sensation in the past. She states that she has not tried any medications for the relief for her symptoms. She denies leg swelling, diaphoresis, nausea, and any other symptoms. Denies hx of thyroid issues. 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  She did convert on her own without cardioversion after receiving 5 mg lopressor and was given rx for metoprolol She reports that she has severe extrinsic asthma and was severly symptomatic for years until she started taking Xolair injections every two weeks. With this information, I am hesitate to for her to take  BB's, prescribed in the ER for rate control and will change to CCB. She can stop plavix and with a chadvasc score of 5, and discussion of choice of anticoagulants, opts for Xarelto daily.   Today, she denies symptoms of palpitations, chest pain, shortness of breath, orthopnea, PND, lower extremity edema, dizziness, presyncope, syncope, or neurologic sequela. The patient is  tolerating medications without difficulties and is otherwise without complaint today.   Past Medical History  Diagnosis Date  . Chronic airway obstruction, not elsewhere classified   . Disorder of bone and cartilage, unspecified   . Unspecified asthma(493.90)   . Routine general medical examination at a health care facility    Past Surgical History  Procedure Laterality Date  . Abdominal hysterectomy      Current Outpatient Prescriptions  Medication Sig Dispense Refill  . ADVAIR DISKUS 250-50 MCG/DOSE AEPB INHALE 1 PUFF TWICE DAILY, RINSE AFTER USE 120 each 2  . albuterol (VENTOLIN HFA) 108 (90 BASE) MCG/ACT inhaler 2 puffs every four hours as needed- rescue 18 Inhaler 12  . aspirin 81 MG tablet Take 81 mg by mouth daily.    . Calcium Carbonate-Vitamin D (CALCIUM 500/D) 500-125 MG-UNIT TABS Take 1 tablet by mouth daily. Reported on 06/16/2015    . clopidogrel (PLAVIX) 75 MG tablet Take 1 tablet (75 mg total) by mouth daily. 90 tablet 1  . fish oil-omega-3 fatty acids 1000 MG capsule Take 2 g by mouth daily.      . Multiple Vitamin (MULTIVITAMIN) tablet Take 1 tablet by mouth daily.     Marland Kitchen omalizumab (XOLAIR) 150 MG injection Inject 300 mg into the skin every 14 (fourteen) days.      Marland Kitchen diltiazem (CARDIZEM CD) 120 MG 24 hr capsule Take 1 capsule (120 mg total) by mouth daily. 30 capsule 3  .  rivaroxaban (XARELTO) 20 MG TABS tablet Take 1 tablet (20 mg total) by mouth daily with supper. 30 tablet 0  . rivaroxaban (XARELTO) 20 MG TABS tablet Take 1 tablet (20 mg total) by mouth daily with supper. 30 tablet 6   No current facility-administered medications for this encounter.    Allergies  Allergen Reactions  . Ibuprofen Hives  . Clarithromycin Other (See Comments)    "just about killed me"  . Codeine Phosphate     REACTION: nausea    Social History   Social History  . Marital Status: Divorced    Spouse Name: N/A  . Number of Children: N/A  . Years of Education: N/A    Occupational History  . retired    Social History Main Topics  . Smoking status: Former Smoker -- 0 years    Quit date: 05/24/1977  . Smokeless tobacco: Not on file  . Alcohol Use: No  . Drug Use: No  . Sexual Activity: Not on file   Other Topics Concern  . Not on file   Social History Narrative    Family History  Problem Relation Age of Onset  . COPD Father   . Cancer Mother   . Kidney cancer Sister   . Lung cancer Sister   . Ovarian cancer Sister   . Liver disease Sister   . Melanoma Sister     ROS- All systems are reviewed and negative except as per the HPI above  Physical Exam: Filed Vitals:   12/11/15 1415  BP: 140/84  Pulse: 68  Height: 5\' 10"  (1.778 m)  Weight: 208 lb (94.348 kg)    GEN- The patient is well appearing, alert and oriented x 3 today.   Head- normocephalic, atraumatic Eyes-  Sclera clear, conjunctiva pink Ears- hearing intact Oropharynx- clear Neck- supple, no JVP Lymph- no cervical lymphadenopathy Lungs- Clear to ausculation bilaterally, normal work of breathing Heart- Regular rate and rhythm, no murmurs, rubs or gallops, PMI not laterally displaced GI- soft, NT, ND, + BS Extremities- no clubbing, cyanosis, or edema MS- no significant deformity or atrophy Skin- no rash or lesion Psych- euthymic mood, full affect Neuro- strength and sensation are intact  EKG- NSR at 68 bpm, pr int 122 ms, qrs int 80 ms, qtc 408 ms Epic records reviewed Creat 0.78 ms, k+4.1, TSH nml, Troponin negative, CBC unremarkable other than WBC 12.1( on steroids)  Assessment and Plan: 1. New onset a flutter May be secondary to steroid taper Converted in ER with 5 mg lopressor IV However, afraid that pt may not tolerate BB due to severe asthma Rx cardizem 120 mg a day Echo  2. Chadsvasc score of 5 Stop plavix for remote blood clot to left eye Start xarelto  with crcl calculated at 0.78 mm/dl Bleeding precautions discussed  F/u in one week

## 2015-12-11 NOTE — ED Provider Notes (Addendum)
CSN: [id number]     Arrival date & time 12/11/15  0015 History  By signing my name below, I, Soijett Blue, attest that this documentation has been prepared under the direction and in the presence of Virgel Manifold, MD. Electronically Signed: Carnegie, ED Scribe. 12/11/2015. 1:08 AM.   Chief Complaint  Patient presents with  . Chest Pain     The history is provided by the patient. No language interpreter was used.    HPI Comments: Laura Mendez is a 75 y.o. female who presents to the Emergency Department complaining of left sided CP onset 3 hours ago PTA. Pt notes that she was laying in bed reading a romance novel when her left sided CP began. Pt states that she has an intermittent cramping sensation to her chest that radiates to her left arm. Denies recent medication changes, but pt notes that she was recently placed on prednisone for a rash to her RLE and she has a couple days left in her Rx. Pt reports that she has been evaluated by two dermatologists for her rash. Denies any new supplements or caffeine use. She states that she is having associated symptoms of palpitations. Pt notes that she has had a flutter sensation in the past. She states that she has not tried any medications for the relief for her symptoms. She denies leg swelling, diaphoresis, nausea, and any other symptoms. Denies hx of thyroid issues. Pt takes daily plavix.   Past Medical History  Diagnosis Date  . Chronic airway obstruction, not elsewhere classified   . Disorder of bone and cartilage, unspecified   . Unspecified asthma(493.90)   . Routine general medical examination at a health care facility    Past Surgical History  Procedure Laterality Date  . Abdominal hysterectomy     Family History  Problem Relation Age of Onset  . COPD Father   . Cancer Mother   . Kidney cancer Sister   . Lung cancer Sister   . Ovarian cancer Sister   . Liver disease Sister   . Melanoma Sister    Social History  Substance  Use Topics  . Smoking status: Former Smoker -- 0 years    Quit date: 05/24/1977  . Smokeless tobacco: None  . Alcohol Use: No   OB History    No data available     Review of Systems  A complete 10 system review of systems was obtained and all systems are negative except as noted in the HPI and PMH.   Allergies  Clarithromycin; Codeine phosphate; and Ibuprofen  Home Medications   Prior to Admission medications   Medication Sig Start Date End Date Taking? Authorizing Provider  ADVAIR DISKUS 250-50 MCG/DOSE AEPB INHALE 1 PUFF TWICE DAILY, RINSE AFTER USE 08/14/15   Deneise Lever, MD  albuterol (VENTOLIN HFA) 108 (90 BASE) MCG/ACT inhaler 2 puffs every four hours as needed- rescue 05/08/15   Deneise Lever, MD  aspirin 81 MG tablet Take 81 mg by mouth daily.    Historical Provider, MD  Calcium Carbonate-Vitamin D (CALCIUM 500/D) 500-125 MG-UNIT TABS Take 1 tablet by mouth daily. Reported on 06/16/2015    Historical Provider, MD  clopidogrel (PLAVIX) 75 MG tablet Take 1 tablet (75 mg total) by mouth daily. 12/05/15   Hoyt Koch, MD  fish oil-omega-3 fatty acids 1000 MG capsule Take 2 g by mouth daily.      Historical Provider, MD  Multiple Vitamin (MULTIVITAMIN) tablet Take by mouth daily.  Historical Provider, MD  omalizumab [name]) 150 MG injection Inject 300 mg into the skin every 14 (fourteen) days.      Historical Provider, MD   BP 124/100 mmHg  Pulse 149  Temp(Src) 98.1 F (36.7 C) (Oral)  Resp 20  Wt 210 lb (95.255 kg)  SpO2 93% Physical Exam  Constitutional: She is oriented to person, place, and time. She appears well-developed and well-nourished. No distress.  HENT:  Head: Normocephalic and atraumatic.  Eyes: EOM are normal.  Neck: Neck supple.  Cardiovascular: Regular rhythm and normal heart sounds.  Tachycardia present.  Exam reveals no gallop and no friction rub.   No murmur heard. Very tachycardic  Pulmonary/Chest: Effort normal and breath sounds  normal. No respiratory distress. She has no wheezes. She has no rales.  Abdominal: She exhibits no distension.  Musculoskeletal: Normal range of motion.  Neurological: She is alert and oriented to person, place, and time.  Skin: Skin is warm and dry.  Psychiatric: She has a normal mood and affect. Her behavior is normal.  Nursing note and vitals reviewed.   ED Course  Procedures (including critical care time) DIAGNOSTIC STUDIES: Oxygen Saturation is 100% on RA, nl by my interpretation.    COORDINATION OF CARE: 1:08 AM Discussed treatment plan with pt at bedside which includes labs, CXR, EKG, and pt agreed to plan.    Labs Review Labs Reviewed  BASIC METABOLIC PANEL - Abnormal; Notable for the following:    Glucose, Bld 124 (*)    All other components within normal limits  CBC - Abnormal; Notable for the following:    WBC 12.1 (*)    All other components within normal limits  TROPONIN I  MAGNESIUM  TSH  I-STAT TROPOININ, ED    Imaging Review Dg Chest 2 View  12/11/2015  CLINICAL DATA:  Chest pain radiating to the jaw EXAM: CHEST  2 VIEW COMPARISON:  11/04/2014 FINDINGS: Stable basilar scarring. There is no edema, consolidation, effusion, or pneumothorax. Normal heart size and aortic contours. Prominent mitral annular calcification. IMPRESSION: No acute finding. Bibasilar scar. Electronically Signed   By: Monte Fantasia M.D.   On: 12/11/2015 01:25   I have personally reviewed and evaluated these images and lab results as part of my medical decision-making.   EKG Interpretation   Date/Time:  Thursday December 11 2015 00:12:45 EDT Ventricular Rate:  150 PR Interval:    QRS Duration: 116 QT Interval:  354 QTC Calculation: 559 R Axis:   20 Text Interpretation:  Atrial flutter with 2:1 A-V conduction Nonspecific  ST abnormality Abnormal ECG Confirmed by Seaborn Nakama  MD, Kaylinn Dedic ([id number]) on  12/11/2015 12:41:37 AM      MDM   Final diagnoses:  New onset atrial flutter ([place])     75yF with new onset afib. She converted in the ED. Symptoms resolved.  Outpt cards FU.   Virgel Manifold, MD 12/25/15 2228  I personally preformed the services scribed in my presence. The recorded information has been reviewed is accurate. Virgel Manifold, MD.   Virgel Manifold, MD 12/25/15 2228

## 2015-12-11 NOTE — ED Notes (Signed)
MD at bedside. 

## 2015-12-11 NOTE — ED Notes (Signed)
Patient ambulated to restroom and back with steady gait, no distress. Upon hooking patient back up to monitor, she is in NSR with rate in the low 70s at this time. MD aware.

## 2015-12-12 ENCOUNTER — Telehealth (HOSPITAL_COMMUNITY): Payer: Self-pay | Admitting: *Deleted

## 2015-12-12 NOTE — Telephone Encounter (Signed)
Xarelto preauth # PA Case [id number]. Valid until 05/23/16

## 2015-12-18 ENCOUNTER — Ambulatory Visit (HOSPITAL_COMMUNITY)
Admission: RE | Admit: 2015-12-18 | Discharge: 2015-12-18 | Disposition: A | Discharge status: Home / Self Care | Payer: Medicare Other | Source: Ambulatory Visit | Attending: Nurse Practitioner | Admitting: Nurse Practitioner

## 2015-12-18 ENCOUNTER — Encounter (HOSPITAL_COMMUNITY): Payer: Self-pay | Admitting: Nurse Practitioner

## 2015-12-18 VITALS — BP 146/78 | Ht 70.0 in | Wt 206.4 lb

## 2015-12-18 DIAGNOSIS — J449 Chronic obstructive pulmonary disease, unspecified: Secondary | ICD-10-CM | POA: Diagnosis not present

## 2015-12-18 DIAGNOSIS — Z87891 Personal history of nicotine dependence: Secondary | ICD-10-CM | POA: Insufficient documentation

## 2015-12-18 DIAGNOSIS — I4891 Unspecified atrial fibrillation: Secondary | ICD-10-CM | POA: Diagnosis not present

## 2015-12-18 DIAGNOSIS — Z7982 Long term (current) use of aspirin: Secondary | ICD-10-CM | POA: Diagnosis not present

## 2015-12-18 DIAGNOSIS — J45909 Unspecified asthma, uncomplicated: Secondary | ICD-10-CM | POA: Insufficient documentation

## 2015-12-18 DIAGNOSIS — M899 Disorder of bone, unspecified: Secondary | ICD-10-CM | POA: Insufficient documentation

## 2015-12-18 DIAGNOSIS — I4892 Unspecified atrial flutter: Secondary | ICD-10-CM | POA: Insufficient documentation

## 2015-12-18 DIAGNOSIS — I480 Paroxysmal atrial fibrillation: Secondary | ICD-10-CM

## 2015-12-18 DIAGNOSIS — I498 Other specified cardiac arrhythmias: Secondary | ICD-10-CM | POA: Insufficient documentation

## 2015-12-18 NOTE — Progress Notes (Signed)
Patient ID: YING GROEBER, female   DOB: 07/06/1940, 75 y.o.   MRN: [id number]     Primary Care Physician: Hoyt Koch, MD Referring Physician: ER f/u   ANAYELI FELICIANO is a 75 y.o. female with a h/o new onset a flutter, 150 bpm, that was associated with left chest and left arm pain, dx in the ER last night with f/u in the afib clinic today. She was laying in bed reading a romance novel when her left sided CP began. Pt states that she has an intermittent cramping sensation to her chest that radiates to her left arm.  Pt notes that she was recently placed on prednisone taper and received steriod shot  for a rash of unknown etiology to her arms and trunk and she is mid way in her taper. . Pt reports that she has been evaluated by two dermatologists for her rash. Denies any new supplements or caffeine use.  Pt notes that she has had a flutter sensation in the past, wore a monitor with PCP which showed SR with pac's. She states that she has not tried any medications for the relief for her symptoms. She denies leg swelling, diaphoresis, nausea, and any other symptoms. Denies hx of thyroid issues. 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  She did convert on her own without cardioversion after receiving 5 mg lopressor and was given rx for metoprolol She reports that she has severe extrinsic asthma and was severly symptomatic for years until she started taking Xolair injections every two weeks. With this information, I am hesitate to for her to take  BB's,for rate control and will change to CCB. She can stop plavix and with a chadvasc score of 5, and discussion of choice of anticoagulants, opts for Xarelto daily.   Returns to afib clinic today and has been on xarelto and cardizem x one week. No issues with either one. No further issues with sustained a flutter but does notice a flutter of the heart at times, which is old, and probably a  PC.  Today, she denies symptoms of palpitations, chest pain, shortness of breath, orthopnea, PND, lower extremity edema, dizziness, presyncope, syncope, or neurologic sequela. The patient is tolerating medications without difficulties and is otherwise without complaint today.   Past Medical History:  Diagnosis Date  . Chronic airway obstruction, not elsewhere classified   . Disorder of bone and cartilage, unspecified   . Routine general medical examination at a health care facility   . Unspecified asthma(493.90)    Past Surgical History:  Procedure Laterality Date  . ABDOMINAL HYSTERECTOMY      Current Outpatient Prescriptions  Medication Sig Dispense Refill  . ADVAIR DISKUS 250-50 MCG/DOSE AEPB INHALE 1 PUFF TWICE DAILY, RINSE AFTER USE 120 each 2  . albuterol (VENTOLIN HFA) 108 (90 BASE) MCG/ACT inhaler 2 puffs every four hours as needed- rescue 18 Inhaler 12  . aspirin 81 MG tablet Take 81 mg by mouth daily.    . Calcium Carbonate-Vitamin D (CALCIUM 500/D) 500-125 MG-UNIT TABS Take 1 tablet by mouth daily. Reported on 06/16/2015    . clopidogrel (PLAVIX) 75 MG tablet Take 1 tablet (75 mg total) by mouth daily. 90 tablet 1  . diltiazem (CARDIZEM CD) 120 MG 24 hr capsule Take 1 capsule (120 mg total) by mouth daily. 30 capsule 3  . fish oil-omega-3 fatty acids 1000 MG capsule Take 2 g by mouth  daily.      . Multiple Vitamin (MULTIVITAMIN) tablet Take 1 tablet by mouth daily.     Marland Kitchen omalizumab (XOLAIR) 150 MG injection Inject 300 mg into the skin every 14 (fourteen) days.      . rivaroxaban (XARELTO) 20 MG TABS tablet Take 1 tablet (20 mg total) by mouth daily with supper. 30 tablet 0   No current facility-administered medications for this encounter.     Allergies  Allergen Reactions  . Ibuprofen Hives  . Clarithromycin Other (See Comments)    "just about killed me"  . Codeine Phosphate     REACTION: nausea    Social History   Social History  . Marital status: Divorced     Spouse name: N/A  . Number of children: N/A  . Years of education: N/A   Occupational History  . retired    Social History Main Topics  . Smoking status: Former Smoker    Years: 0.00    Quit date: 05/24/1977  . Smokeless tobacco: Not on file  . Alcohol use No  . Drug use: No  . Sexual activity: Not on file   Other Topics Concern  . Not on file   Social History Narrative  . No narrative on file    Family History  Problem Relation Age of Onset  . COPD Father   . Cancer Mother   . Kidney cancer Sister   . Lung cancer Sister   . Ovarian cancer Sister   . Liver disease Sister   . Melanoma Sister     ROS- All systems are reviewed and negative except as per the HPI above  Physical Exam: Vitals:   12/18/15 0930  BP: (!) 146/78  Weight: 206 lb 6.4 oz (93.6 kg)  Height: 5\' 10"  (1.778 m)    GEN- The patient is well appearing, alert and oriented x 3 today.   Head- normocephalic, atraumatic Eyes-  Sclera clear, conjunctiva pink Ears- hearing intact Oropharynx- clear Neck- supple, no JVP Lymph- no cervical lymphadenopathy Lungs- Clear to ausculation bilaterally, normal work of breathing Heart- Regular rate and rhythm, no murmurs, rubs or gallops, PMI not laterally displaced GI- soft, NT, ND, + BS Extremities- no clubbing, cyanosis, or edema MS- no significant deformity or atrophy Skin- no rash or lesion Psych- euthymic mood, full affect Neuro- strength and sensation are intact  EKG- SR at 70 bpm, with a sinus arrythmia,  pr int 122 ms, qrs int 86 ms, qtc 421 ms Epic records reviewed Creat 0.78 ms, k+4.1, TSH nml, Troponin negative, CBC unremarkable other than WBC 12.1( on steroids)  Assessment and Plan: 1. New onset a flutter In the setting of  steroid taper Converted in ER with 5 mg lopressor IV However, afraid that pt may not tolerate BB due to severe asthma Tolerating cardizem 120 mg a day Echo pending  2. 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 Donnamae [place], [facility] [address] [place], [place] [address] [phone]

## 2015-12-19 ENCOUNTER — Ambulatory Visit (INDEPENDENT_AMBULATORY_CARE_PROVIDER_SITE_OTHER): Payer: Medicare Other

## 2015-12-19 DIAGNOSIS — J4540 Moderate persistent asthma, uncomplicated: Secondary | ICD-10-CM | POA: Diagnosis not present

## 2015-12-19 MED ORDER — OMALIZUMAB 150 MG ~~LOC~~ SOLR
300.0000 mg | SUBCUTANEOUS | Status: DC
  Administered 2015-12-19: 300 mg via SUBCUTANEOUS

## 2015-12-24 ENCOUNTER — Telehealth: Payer: Self-pay | Admitting: Internal Medicine

## 2015-12-24 NOTE — Telephone Encounter (Signed)
#   vials:4 Ordered date:12/24/15 Shipping Date:12/24/15

## 2015-12-25 NOTE — Telephone Encounter (Signed)
#   Vials:4 Arrival Date:12/25/15 Lot [id number] Exp Date:3/21

## 2015-12-29 ENCOUNTER — Ambulatory Visit (HOSPITAL_COMMUNITY)
Admission: RE | Admit: 2015-12-29 | Discharge: 2015-12-29 | Disposition: A | Discharge status: Home / Self Care | Payer: Medicare Other | Source: Ambulatory Visit | Attending: Nurse Practitioner | Admitting: Nurse Practitioner

## 2015-12-29 DIAGNOSIS — I480 Paroxysmal atrial fibrillation: Secondary | ICD-10-CM | POA: Insufficient documentation

## 2015-12-29 DIAGNOSIS — I340 Nonrheumatic mitral (valve) insufficiency: Secondary | ICD-10-CM | POA: Diagnosis not present

## 2015-12-29 DIAGNOSIS — I071 Rheumatic tricuspid insufficiency: Secondary | ICD-10-CM | POA: Insufficient documentation

## 2015-12-29 DIAGNOSIS — E669 Obesity, unspecified: Secondary | ICD-10-CM | POA: Insufficient documentation

## 2015-12-29 DIAGNOSIS — I4891 Unspecified atrial fibrillation: Secondary | ICD-10-CM | POA: Diagnosis present

## 2015-12-29 DIAGNOSIS — Z6829 Body mass index (BMI) 29.0-29.9, adult: Secondary | ICD-10-CM | POA: Insufficient documentation

## 2015-12-29 DIAGNOSIS — I119 Hypertensive heart disease without heart failure: Secondary | ICD-10-CM | POA: Insufficient documentation

## 2015-12-29 NOTE — Progress Notes (Signed)
  Echocardiogram 2D Echocardiogram has been performed.  Laura Mendez 12/29/2015, 10:30 AM

## 2016-01-02 ENCOUNTER — Ambulatory Visit (INDEPENDENT_AMBULATORY_CARE_PROVIDER_SITE_OTHER): Payer: Medicare Other

## 2016-01-02 ENCOUNTER — Encounter (HOSPITAL_COMMUNITY): Payer: Self-pay | Admitting: *Deleted

## 2016-01-02 DIAGNOSIS — J45909 Unspecified asthma, uncomplicated: Secondary | ICD-10-CM

## 2016-01-02 DIAGNOSIS — J4540 Moderate persistent asthma, uncomplicated: Secondary | ICD-10-CM | POA: Diagnosis not present

## 2016-01-02 MED ORDER — OMALIZUMAB 150 MG ~~LOC~~ SOLR
300.0000 mg | SUBCUTANEOUS | Status: DC
  Administered 2016-01-02: 300 mg via SUBCUTANEOUS

## 2016-01-13 ENCOUNTER — Encounter: Payer: Self-pay | Admitting: Internal Medicine

## 2016-01-13 ENCOUNTER — Ambulatory Visit (INDEPENDENT_AMBULATORY_CARE_PROVIDER_SITE_OTHER): Payer: Medicare Other | Admitting: Internal Medicine

## 2016-01-13 ENCOUNTER — Other Ambulatory Visit (INDEPENDENT_AMBULATORY_CARE_PROVIDER_SITE_OTHER): Payer: Medicare Other

## 2016-01-13 VITALS — BP 142/90 | HR 65 | Temp 97.7°F | Resp 16 | Ht 70.0 in | Wt 199.0 lb

## 2016-01-13 DIAGNOSIS — Z0000 Encounter for general adult medical examination without abnormal findings: Secondary | ICD-10-CM | POA: Diagnosis not present

## 2016-01-13 DIAGNOSIS — I4892 Unspecified atrial flutter: Secondary | ICD-10-CM | POA: Insufficient documentation

## 2016-01-13 DIAGNOSIS — I480 Paroxysmal atrial fibrillation: Secondary | ICD-10-CM | POA: Insufficient documentation

## 2016-01-13 DIAGNOSIS — M8580 Other specified disorders of bone density and structure, unspecified site: Secondary | ICD-10-CM | POA: Diagnosis not present

## 2016-01-13 DIAGNOSIS — J45909 Unspecified asthma, uncomplicated: Secondary | ICD-10-CM

## 2016-01-13 DIAGNOSIS — J449 Chronic obstructive pulmonary disease, unspecified: Secondary | ICD-10-CM

## 2016-01-13 LAB — T4, FREE: FREE T4: 0.99 ng/dL (ref 0.60–1.60)

## 2016-01-13 LAB — COMPREHENSIVE METABOLIC PANEL
ALT: 18 U/L (ref 0–35)
AST: 20 U/L (ref 0–37)
Albumin: 4.3 g/dL (ref 3.5–5.2)
Alkaline Phosphatase: 58 U/L (ref 39–117)
BILIRUBIN TOTAL: 0.6 mg/dL (ref 0.2–1.2)
BUN: 17 mg/dL (ref 6–23)
CALCIUM: 9.4 mg/dL (ref 8.4–10.5)
CHLORIDE: 105 meq/L (ref 96–112)
CO2: 29 meq/L (ref 19–32)
CREATININE: 0.88 mg/dL (ref 0.40–1.20)
GFR: 66.53 mL/min (ref >60.00)
Glucose, Bld: 95 mg/dL (ref 70–99)
Potassium: 4.6 mEq/L (ref 3.5–5.1)
SODIUM: 142 meq/L (ref 135–145)
Total Protein: 7.5 g/dL (ref 6.0–8.3)

## 2016-01-13 LAB — CBC
HCT: 45 % (ref 36.0–46.0)
Hemoglobin: 15 g/dL (ref 12.0–15.0)
MCHC: 33.3 g/dL (ref 30.0–36.0)
MCV: 90.1 fl (ref 78.0–100.0)
PLATELETS: 262 10*3/uL (ref 150.0–400.0)
RBC: 4.99 Mil/uL (ref 3.87–5.11)
RDW: 14.4 % (ref 11.5–15.5)
WBC: 7.8 10*3/uL (ref 4.0–10.5)

## 2016-01-13 LAB — LIPID PANEL
CHOL/HDL RATIO: 4
Cholesterol: 214 mg/dL — ABNORMAL HIGH (ref 0–200)
HDL: 52 mg/dL (ref >39.00)
LDL CALC: 129 mg/dL — AB (ref 0–99)
NONHDL: 161.57
Triglycerides: 164 mg/dL — ABNORMAL HIGH (ref 0.0–149.0)
VLDL: 32.8 mg/dL (ref 0.0–40.0)

## 2016-01-13 LAB — TSH: TSH: 2.24 u[IU]/mL (ref 0.35–4.50)

## 2016-01-13 NOTE — Assessment & Plan Note (Signed)
Seeing [facility] fib clinic and no notes from them that I can view although they did start her on cardiazem and xarelto.

## 2016-01-13 NOTE — Assessment & Plan Note (Signed)
Will repeat DEXA next year for q 5 year screening.

## 2016-01-13 NOTE — Assessment & Plan Note (Signed)
Colonoscopy up to date, tdap up to date. Wants to wait until October for flu shot. Needs DEXA next year. Checking labs, adjust as needed. Exercising and non-smoker. Counseled about sun safety and mole surveillance. Given 10 year screening recommendations.

## 2016-01-13 NOTE — Progress Notes (Signed)
Pre visit review using our clinic review tool, if applicable. No additional management support is needed unless otherwise documented below in the visit note. 

## 2016-01-13 NOTE — Progress Notes (Signed)
   Subjective:    Patient ID: Laura Mendez, female    DOB: 12-15-1940, 75 y.o.   MRN: [id number]  HPI Here for medicare wellness as well as physical, no new complaints. Please see A/P for status and treatment of chronic medical problems. New A fib diagnosis since last visit and changed to xarelto from plavix. Seeing cardiology.   Diet: heart healthy Physical activity: exercising 3 times per week Depression/mood screen: negative Hearing: intact to whispered voice Visual acuity: grossly normal, performs annual eye exam  ADLs: capable Fall risk: none Home safety: good Cognitive evaluation: intact to orientation, naming, recall and repetition EOL planning: adv directives discussed  I have personally reviewed and have noted 1. The patient's medical and social history - reviewed today no changes 2. Their use of alcohol, tobacco or illicit drugs 3. Their current medications and supplements 4. The patient's functional ability including ADL's, fall risks, home safety risks and hearing or visual impairment. 5. Diet and physical activities 6. Evidence for depression or mood disorders 7. Care team reviewed and updated (available in snapshot)  Review of Systems  Constitutional: Negative for activity change, appetite change, fatigue, fever and unexpected weight change.  HENT: Negative.   Eyes: Negative.   Respiratory: Negative for cough, chest tightness, shortness of breath and wheezing.   Cardiovascular: Negative for chest pain, palpitations and leg swelling.  Gastrointestinal: Negative for abdominal distention, abdominal pain, constipation, diarrhea and nausea.  Musculoskeletal: Negative.   Skin: Negative.   Neurological: Negative.   Hematological: Bruises/bleeds easily.  Psychiatric/Behavioral: Negative.       Objective:   Physical Exam  Constitutional: She is oriented to person, place, and time. She appears well-developed and well-nourished.  HENT:  Head: Normocephalic and  atraumatic.  Nose: Nose normal.  Mouth/Throat: Oropharynx is clear and moist.  Eyes: EOM are normal.  Neck: Normal range of motion. No JVD present. No thyromegaly present.  Cardiovascular: Normal rate and regular rhythm.   Carotids without bruit bilaterally. Sounds regular on exam.   Pulmonary/Chest: Effort normal and breath sounds normal. No respiratory distress. She has no wheezes.  Abdominal: Soft. Bowel sounds are normal. She exhibits no distension. There is no tenderness. There is no rebound.  Musculoskeletal: She exhibits no edema.  Neurological: She is alert and oriented to person, place, and time. Coordination normal.  Skin: Skin is warm and dry.  Psychiatric: She has a normal mood and affect.   Vitals:   01/13/16 0829  BP: (!) 142/90  Pulse: 65  Resp: 16  Temp: 97.7 F (36.5 C)  TempSrc: Oral  SpO2: 95%  Weight: 199 lb (90.3 kg)  Height: 5\' 10"  (1.778 m)      Assessment & Plan:

## 2016-01-13 NOTE — Assessment & Plan Note (Signed)
Continues to do well on xolair, adviar, albuterol prn.

## 2016-01-13 NOTE — Patient Instructions (Signed)
We are checking the labs today and call back with the results.   Health Maintenance, Female Adopting a healthy lifestyle and getting preventive care can go a long way to promote health and wellness. Talk with your health care provider about what schedule of regular examinations is right for you. This is a good chance for you to check in with your provider about disease prevention and staying healthy. In between checkups, there are plenty of things you can do on your own. Experts have done a lot of research about which lifestyle changes and preventive measures are most likely to keep you healthy. Ask your health care provider for more information. WEIGHT AND DIET  Eat a healthy diet  Be sure to include plenty of vegetables, fruits, low-fat dairy products, and lean protein.  Do not eat a lot of foods high in solid fats, added sugars, or salt.  Get regular exercise. This is one of the most important things you can do for your health.  Most adults should exercise for at least 150 minutes each week. The exercise should increase your heart rate and make you sweat (moderate-intensity exercise).  Most adults should also do strengthening exercises at least twice a week. This is in addition to the moderate-intensity exercise.  Maintain a healthy weight  Body mass index (BMI) is a measurement that can be used to identify possible weight problems. It estimates body fat based on height and weight. Your health care provider can help determine your BMI and help you achieve or maintain a healthy weight.  For females 72 years of age and older:   A BMI below 18.5 is considered underweight.  A BMI of 18.5 to 24.9 is normal.  A BMI of 25 to 29.9 is considered overweight.  A BMI of 30 and above is considered obese.  Watch levels of cholesterol and blood lipids  You should start having your blood tested for lipids and cholesterol at 75 years of age, then have this test every 5 years.  You may need to  have your cholesterol levels checked more often if:  Your lipid or cholesterol levels are high.  You are older than 75 years of age.  You are at high risk for heart disease.  CANCER SCREENING   Lung Cancer  Lung cancer screening is recommended for adults 56-56 years old who are at high risk for lung cancer because of a history of smoking.  A yearly low-dose CT scan of the lungs is recommended for people who:  Currently smoke.  Have quit within the past 15 years.  Have at least a 30-pack-year history of smoking. A pack year is smoking an average of one pack of cigarettes a day for 1 year.  Yearly screening should continue until it has been 15 years since you quit.  Yearly screening should stop if you develop a health problem that would prevent you from having lung cancer treatment.  Breast Cancer  Practice breast self-awareness. This means understanding how your breasts normally appear and feel.  It also means doing regular breast self-exams. Let your health care provider know about any changes, no matter how small.  If you are in your 20s or 30s, you should have a clinical breast exam (CBE) by a health care provider every 1-3 years as part of a regular health exam.  If you are 39 or older, have a CBE every year. Also consider having a breast X-ray (mammogram) every year.  If you have a family history of  history of breast cancer, talk to your health care provider about genetic screening.  If you are at high risk for breast cancer, talk to your health care provider about having an MRI and a mammogram every year.  Breast cancer gene (BRCA) assessment is recommended for women who have family members with BRCA-related cancers. BRCA-related cancers include:  Breast.  Ovarian.  Tubal.  Peritoneal cancers.  Results of the assessment will determine the need for genetic counseling and BRCA1 and BRCA2 testing. Cervical Cancer Your health care provider may recommend that you be  screened regularly for cancer of the pelvic organs (ovaries, uterus, and vagina). This screening involves a pelvic examination, including checking for microscopic changes to the surface of your cervix (Pap test). You may be encouraged to have this screening done every 3 years, beginning at age 21.  For women ages 30-65, health care providers may recommend pelvic exams and Pap testing every 3 years, or they may recommend the Pap and pelvic exam, combined with testing for human papilloma virus (HPV), every 5 years. Some types of HPV increase your risk of cervical cancer. Testing for HPV may also be done on women of any age with unclear Pap test results.  Other health care providers may not recommend any screening for nonpregnant women who are considered low risk for pelvic cancer and who do not have symptoms. Ask your health care provider if a screening pelvic exam is right for you.  If you have had past treatment for cervical cancer or a condition that could lead to cancer, you need Pap tests and screening for cancer for at least 20 years after your treatment. If Pap tests have been discontinued, your risk factors (such as having a new sexual partner) need to be reassessed to determine if screening should resume. Some women have medical problems that increase the chance of getting cervical cancer. In these cases, your health care provider may recommend more frequent screening and Pap tests. Colorectal Cancer  This type of cancer can be detected and often prevented.  Routine colorectal cancer screening usually begins at 75 years of age and continues through 75 years of age.  Your health care provider may recommend screening at an earlier age if you have risk factors for colon cancer.  Your health care provider may also recommend using home test kits to check for hidden blood in the stool.  A small camera at the end of a tube can be used to examine your colon directly (sigmoidoscopy or colonoscopy).  This is done to check for the earliest forms of colorectal cancer.  Routine screening usually begins at age 50.  Direct examination of the colon should be repeated every 5-10 years through 75 years of age. However, you may need to be screened more often if early forms of precancerous polyps or small growths are found. Skin Cancer  Check your skin from head to toe regularly.  Tell your health care provider about any new moles or changes in moles, especially if there is a change in a mole's shape or color.  Also tell your health care provider if you have a mole that is larger than the size of a pencil eraser.  Always use sunscreen. Apply sunscreen liberally and repeatedly throughout the day.  Protect yourself by wearing long sleeves, pants, a wide-brimmed hat, and sunglasses whenever you are outside. HEART DISEASE, DIABETES, AND HIGH BLOOD PRESSURE   High blood pressure causes heart disease and increases the risk of stroke. High blood pressure   is more likely to develop in:  People who have blood pressure in the high end of the normal range (130-139/85-89 mm Hg).  People who are overweight or obese.  People who are African American.  If you are 18-39 years of age, have your blood pressure checked every 3-5 years. If you are 40 years of age or older, have your blood pressure checked every year. You should have your blood pressure measured twice--once when you are at a hospital or clinic, and once when you are not at a hospital or clinic. Record the average of the two measurements. To check your blood pressure when you are not at a hospital or clinic, you can use:  An automated blood pressure machine at a pharmacy.  A home blood pressure monitor.  If you are between 55 years and 79 years old, ask your health care provider if you should take aspirin to prevent strokes.  Have regular diabetes screenings. This involves taking a blood sample to check your fasting blood sugar level.  If you  are at a normal weight and have a low risk for diabetes, have this test once every three years after 75 years of age.  If you are overweight and have a high risk for diabetes, consider being tested at a younger age or more often. PREVENTING INFECTION  Hepatitis B  If you have a higher risk for hepatitis B, you should be screened for this virus. You are considered at high risk for hepatitis B if:  You were born in a country where hepatitis B is common. Ask your health care provider which countries are considered high risk.  Your parents were born in a high-risk country, and you have not been immunized against hepatitis B (hepatitis B vaccine).  You have HIV or AIDS.  You use needles to inject street drugs.  You live with someone who has hepatitis B.  You have had sex with someone who has hepatitis B.  You get hemodialysis treatment.  You take certain medicines for conditions, including cancer, organ transplantation, and autoimmune conditions. Hepatitis C  Blood testing is recommended for:  Everyone born from 1945 through 1965.  Anyone with known risk factors for hepatitis C. Sexually transmitted infections (STIs)  You should be screened for sexually transmitted infections (STIs) including gonorrhea and chlamydia if:  You are sexually active and are younger than 75 years of age.  You are older than 75 years of age and your health care provider tells you that you are at risk for this type of infection.  Your sexual activity has changed since you were last screened and you are at an increased risk for chlamydia or gonorrhea. Ask your health care provider if you are at risk.  If you do not have HIV, but are at risk, it may be recommended that you take a prescription medicine daily to prevent HIV infection. This is called pre-exposure prophylaxis (PrEP). You are considered at risk if:  You are sexually active and do not regularly use condoms or know the HIV status of your  partner(s).  You take drugs by injection.  You are sexually active with a partner who has HIV. Talk with your health care provider about whether you are at high risk of being infected with HIV. If you choose to begin PrEP, you should first be tested for HIV. You should then be tested every 3 months for as long as you are taking PrEP.  PREGNANCY   If you are premenopausal and   become pregnant, ask your health care provider about preconception counseling.  If you may become pregnant, take 400 to 800 micrograms (mcg) of folic acid every day.  If you want to prevent pregnancy, talk to your health care provider about birth control (contraception). OSTEOPOROSIS AND MENOPAUSE   Osteoporosis is a disease in which the bones lose minerals and strength with aging. This can result in serious bone fractures. Your risk for osteoporosis can be identified using a bone density scan.  If you are 46 years of age or older, or if you are at risk for osteoporosis and fractures, ask your health care provider if you should be screened.  Ask your health care provider whether you should take a calcium or vitamin D supplement to lower your risk for osteoporosis.  Menopause may have certain physical symptoms and risks.  Hormone replacement therapy may reduce some of these symptoms and risks. Talk to your health care provider about whether hormone replacement therapy is right for you.  HOME CARE INSTRUCTIONS   Schedule regular health, dental, and eye exams.  Stay current with your immunizations.   Do not use any tobacco products including cigarettes, chewing tobacco, or electronic cigarettes.  If you are pregnant, do not drink alcohol.  If you are breastfeeding, limit how much and how often you drink alcohol.  Limit alcohol intake to no more than 1 drink per day for nonpregnant women. One drink equals 12 ounces of beer, 5 ounces of wine, or 1 ounces of hard liquor.  Do not use street drugs.  Do  not share needles.  Ask your health care provider for help if you need support or information about quitting drugs.  Tell your health care provider if you often feel depressed.  Tell your health care provider if you have ever been abused or do not feel safe at home.   This information is not intended to replace advice given to you by your health care provider. Make sure you discuss any questions you have with your health care provider.   Document Released: 11/23/2010 Document Revised: 05/31/2014 Document Reviewed: 04/11/2013 Elsevier Interactive Patient Education [employer].

## 2016-01-16 ENCOUNTER — Ambulatory Visit (INDEPENDENT_AMBULATORY_CARE_PROVIDER_SITE_OTHER): Payer: Medicare Other

## 2016-01-16 DIAGNOSIS — J4540 Moderate persistent asthma, uncomplicated: Secondary | ICD-10-CM

## 2016-01-16 DIAGNOSIS — M75121 Complete rotator cuff tear or rupture of right shoulder, not specified as traumatic: Secondary | ICD-10-CM | POA: Diagnosis not present

## 2016-01-16 DIAGNOSIS — M7541 Impingement syndrome of right shoulder: Secondary | ICD-10-CM | POA: Diagnosis not present

## 2016-01-16 DIAGNOSIS — M25511 Pain in right shoulder: Secondary | ICD-10-CM | POA: Diagnosis not present

## 2016-01-19 MED ORDER — OMALIZUMAB 150 MG ~~LOC~~ SOLR
300.0000 mg | Freq: Once | SUBCUTANEOUS | Status: AC
Start: 2016-01-16 — End: 2016-01-16
  Administered 2016-01-16: 300 mg via SUBCUTANEOUS

## 2016-01-20 ENCOUNTER — Ambulatory Visit (HOSPITAL_COMMUNITY)
Admission: RE | Admit: 2016-01-20 | Discharge: 2016-01-20 | Disposition: A | Discharge status: Home / Self Care | Payer: Medicare Other | Source: Ambulatory Visit | Attending: Nurse Practitioner | Admitting: Nurse Practitioner

## 2016-01-20 ENCOUNTER — Encounter (HOSPITAL_COMMUNITY): Payer: Self-pay | Admitting: Nurse Practitioner

## 2016-01-20 VITALS — BP 134/76 | HR 77 | Ht 70.0 in | Wt 203.4 lb

## 2016-01-20 DIAGNOSIS — Z86718 Personal history of other venous thrombosis and embolism: Secondary | ICD-10-CM | POA: Insufficient documentation

## 2016-01-20 DIAGNOSIS — Z888 Allergy status to other drugs, medicaments and biological substances status: Secondary | ICD-10-CM | POA: Insufficient documentation

## 2016-01-20 DIAGNOSIS — I4892 Unspecified atrial flutter: Secondary | ICD-10-CM | POA: Diagnosis not present

## 2016-01-20 DIAGNOSIS — Z885 Allergy status to narcotic agent status: Secondary | ICD-10-CM | POA: Insufficient documentation

## 2016-01-20 DIAGNOSIS — Z87891 Personal history of nicotine dependence: Secondary | ICD-10-CM | POA: Insufficient documentation

## 2016-01-20 DIAGNOSIS — I483 Typical atrial flutter: Secondary | ICD-10-CM | POA: Diagnosis not present

## 2016-01-20 DIAGNOSIS — Z7901 Long term (current) use of anticoagulants: Secondary | ICD-10-CM | POA: Diagnosis not present

## 2016-01-20 DIAGNOSIS — J449 Chronic obstructive pulmonary disease, unspecified: Secondary | ICD-10-CM | POA: Diagnosis not present

## 2016-01-20 DIAGNOSIS — Z79899 Other long term (current) drug therapy: Secondary | ICD-10-CM | POA: Diagnosis not present

## 2016-01-20 DIAGNOSIS — Z825 Family history of asthma and other chronic lower respiratory diseases: Secondary | ICD-10-CM | POA: Insufficient documentation

## 2016-01-20 DIAGNOSIS — I4891 Unspecified atrial fibrillation: Secondary | ICD-10-CM | POA: Diagnosis present

## 2016-01-20 NOTE — Progress Notes (Addendum)
Patient ID: Laura Mendez, female   DOB: 10-29-1940, 75 y.o.   MRN: [id number]     Primary Care Physician: Hoyt Koch, MD Referring Physician: ER f/u   Laura Mendez is a 75 y.o. female with a h/o new onset a flutter, 150 bpm, that was associated with left chest and left arm pain, dx in the ER, 7/20, with f/u in the afib clinic today. She was laying in bed reading a romance novel when her left sided CP began. Pt states that she had an intermittent cramping sensation to her chest that radiated to her left arm.  Pt notes that she was recently placed on prednisone taper and received steriod shot  for a rash of unknown etiology to her arms and trunk and she is mid way in her taper.  Pt reports that she has been evaluated by two dermatologists for her rash. Denies any new supplements or caffeine use. She states that she is having associated symptoms of palpitations. Pt notes that she has had a flutter sensation in the past.  She denies leg swelling, diaphoresis, nausea, and any other symptoms. Denies hx of thyroid issues. 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  She did convert on her own without cardioversion after receiving 5 mg lopressor and was given rx for metoprolol She reports that she has severe extrinsic asthma and was severly symptomatic for years until she started taking Xolair injections every two weeks. With this information, I am hesitate to for her to take  BB's, prescribed in the ER for rate control and will change to CCB. She can stop plavix and with a chadvasc score of 5, and discussion of choice of anticoagulants, opts for Xarelto daily.   She returns to afib clinic 8/28 for f/u echo. She has not had any further arrhythmia. Doing well on xarelto and can stop asa that she takes on her own. EF was normal and left atrium severly dilated at 55 mm. Severely calcified annulus. She has lost 8 lbs and saw PCP recently with  normal labs.  Today, she denies symptoms of palpitations, chest pain, shortness of breath, orthopnea, PND, lower extremity edema, dizziness, presyncope, syncope, or neurologic sequela. The patient is tolerating medications without difficulties and is otherwise without complaint today.   Past Medical History:  Diagnosis Date  . Chronic airway obstruction, not elsewhere classified   . Disorder of bone and cartilage, unspecified   . Routine general medical examination at a health care facility   . Unspecified asthma(493.90)    Past Surgical History:  Procedure Laterality Date  . ABDOMINAL HYSTERECTOMY      Current Outpatient Prescriptions  Medication Sig Dispense Refill  . ADVAIR DISKUS 250-50 MCG/DOSE AEPB INHALE 1 PUFF TWICE DAILY, RINSE AFTER USE 120 each 2  . albuterol (VENTOLIN HFA) 108 (90 BASE) MCG/ACT inhaler 2 puffs every four hours as needed- rescue 18 Inhaler 12  . diltiazem (CARDIZEM CD) 120 MG 24 hr capsule Take 1 capsule (120 mg total) by mouth daily. 30 capsule 3  . fish oil-omega-3 fatty acids 1000 MG capsule Take 2 g by mouth daily.      . Multiple Vitamin (MULTIVITAMIN) tablet Take 1 tablet by mouth daily.     Marland Kitchen omalizumab (XOLAIR) 150 MG injection Inject 300 mg into the skin every 14 (fourteen) days.      . rivaroxaban (XARELTO) 20 MG TABS tablet Take 1 tablet (20  mg total) by mouth daily with supper. 30 tablet 0   Current Facility-Administered Medications  Medication Dose Route Frequency Provider Last Rate Last Dose  . omalizumab [name]) injection 300 mg  300 mg Subcutaneous Q14 Days Brand Males, MD   300 mg at 01/02/16 1004    Allergies  Allergen Reactions  . Ibuprofen Hives  . Clarithromycin Other (See Comments)    "just about killed me"  . Codeine Phosphate     REACTION: nausea    Social History   Social History  . Marital status: Divorced    Spouse name: N/A  . Number of children: N/A  . Years of education: N/A   Occupational History  .  retired    Social History Main Topics  . Smoking status: Former Smoker    Years: 0.00    Quit date: 05/24/1977  . Smokeless tobacco: Never Used  . Alcohol use No  . Drug use: No  . Sexual activity: Not on file   Other Topics Concern  . Not on file   Social History Narrative  . No narrative on file    Family History  Problem Relation Age of Onset  . COPD Father   . Cancer Mother   . Kidney cancer Sister   . Lung cancer Sister   . Ovarian cancer Sister   . Liver disease Sister   . Melanoma Sister     ROS- All systems are reviewed and negative except as per the HPI above  Physical Exam: Vitals:   01/20/16 0909  BP: 134/76  Pulse: 77  Weight: 203 lb 6.4 oz (92.3 kg)  Height: 5\' 10"  (1.778 m)    GEN- The patient is well appearing, alert and oriented x 3 today.   Head- normocephalic, atraumatic Eyes-  Sclera clear, conjunctiva pink Ears- hearing intact Oropharynx- clear Neck- supple, no JVP Lymph- no cervical lymphadenopathy Lungs- Clear to ausculation bilaterally, normal work of breathing Heart- Regular rate and rhythm, no murmurs, rubs or gallops, PMI not laterally displaced GI- soft, NT, ND, + BS Extremities- no clubbing, cyanosis, or edema MS- no significant deformity or atrophy Skin- no rash or lesion Psych- euthymic mood, full affect Neuro- strength and sensation are intact  EKG- NSR at 77 bpm, with PAC's, pr int 120 ms, qrs int 84 ms, qtc 420 ms  Epic records reviewed  Creat 0.78 ms, k+4.1, TSH nml, Troponin negative, CBC unremarkable other than WBC 12.1( was on steroids)  Echo-Left ventricle: The cavity size was mildly dilated. Wall   thickness was increased in a pattern of mild LVH. Systolic   function was normal. The estimated ejection fraction was in the   range of 55% to 60%. Wall motion was normal; there were no   regional wall motion abnormalities. Doppler parameters are   consistent with abnormal left ventricular relaxation (grade 1    diastolic dysfunction). Doppler parameters are consistent with   high ventricular filling pressure. - Mitral valve: Severely calcified annulus. There was mild   regurgitation. Valve area by continuity equation (using LVOT   flow): 2.36 cm^2. - Left atrium: The atrium was severely dilated. - Right ventricle: The cavity size was normal. Wall thickness was   normal. Systolic function was normal. - Right atrium: The atrium was mildly dilated. - Atrial septum: No defect or patent foramen ovale was identified   by color flow Doppler. - Tricuspid valve: There was trivial regurgitation. - Pulmonary arteries: Systolic pressure was within the normal   range. PA peak  pressure: 24 mm Hg (S).  Assessment and Plan: 1. New onset a flutter May have  been secondary to steroid taper, but pt has felt  periods of flutter in the past and has a severely dilated left atrium which indicates that may arrhythmia may  return.  Continue cardizem 120 mg a day  2. 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 Kendel Pesnell, [facility] [address] [place], [place] [address] [phone]

## 2016-01-20 NOTE — Addendum Note (Signed)
Encounter addended by: Sherran Needs, NP on: 01/20/2016 10:15 AM<BR>    Actions taken: Sign clinical note

## 2016-01-21 ENCOUNTER — Telehealth: Payer: Self-pay | Admitting: Internal Medicine

## 2016-01-21 NOTE — Telephone Encounter (Signed)
#   vials:4 Ordered date:01/21/16 Shipping Date:01/22/16

## 2016-01-22 NOTE — Telephone Encounter (Signed)
#   Vials:4 Arrival Date:01/22/16 Lot [id number] Exp Date:3/21

## 2016-01-30 ENCOUNTER — Ambulatory Visit (INDEPENDENT_AMBULATORY_CARE_PROVIDER_SITE_OTHER): Payer: Medicare Other

## 2016-01-30 DIAGNOSIS — J4540 Moderate persistent asthma, uncomplicated: Secondary | ICD-10-CM

## 2016-01-30 MED ORDER — OMALIZUMAB 150 MG ~~LOC~~ SOLR
300.0000 mg | SUBCUTANEOUS | Status: DC
  Administered 2016-01-30: 300 mg via SUBCUTANEOUS

## 2016-02-13 ENCOUNTER — Ambulatory Visit (INDEPENDENT_AMBULATORY_CARE_PROVIDER_SITE_OTHER): Payer: Medicare Other

## 2016-02-13 DIAGNOSIS — J4540 Moderate persistent asthma, uncomplicated: Secondary | ICD-10-CM | POA: Diagnosis not present

## 2016-02-13 MED ORDER — OMALIZUMAB 150 MG ~~LOC~~ SOLR
300.0000 mg | Freq: Once | SUBCUTANEOUS | Status: AC
  Administered 2016-02-13: 300 mg via SUBCUTANEOUS

## 2016-02-20 ENCOUNTER — Telehealth: Payer: Self-pay | Admitting: Internal Medicine

## 2016-02-20 NOTE — Telephone Encounter (Signed)
#   vials:4 Ordered date:02/20/16 Shipping Date:02/23/16 

## 2016-02-23 NOTE — Telephone Encounter (Signed)
#   Vials:4 Arrival Date:02/23/16 Lot #[id number] Exp Date:4/21  

## 2016-02-27 ENCOUNTER — Ambulatory Visit: Payer: Medicare Other

## 2016-02-27 ENCOUNTER — Ambulatory Visit (INDEPENDENT_AMBULATORY_CARE_PROVIDER_SITE_OTHER): Payer: Medicare Other

## 2016-02-27 DIAGNOSIS — J4540 Moderate persistent asthma, uncomplicated: Secondary | ICD-10-CM | POA: Diagnosis not present

## 2016-03-01 MED ORDER — OMALIZUMAB 150 MG ~~LOC~~ SOLR
300.0000 mg | Freq: Once | SUBCUTANEOUS | Status: AC
  Administered 2016-02-27: 300 mg via SUBCUTANEOUS

## 2016-03-05 ENCOUNTER — Other Ambulatory Visit (HOSPITAL_COMMUNITY): Payer: Self-pay | Admitting: *Deleted

## 2016-03-05 ENCOUNTER — Ambulatory Visit (INDEPENDENT_AMBULATORY_CARE_PROVIDER_SITE_OTHER): Payer: Medicare Other

## 2016-03-05 DIAGNOSIS — Z23 Encounter for immunization: Secondary | ICD-10-CM | POA: Diagnosis not present

## 2016-03-05 MED ORDER — DILTIAZEM HCL ER COATED BEADS 120 MG PO CP24: 120.0000 mg | ORAL_CAPSULE | Freq: Every day | ORAL | 3 refills | Status: DC

## 2016-03-12 ENCOUNTER — Ambulatory Visit (INDEPENDENT_AMBULATORY_CARE_PROVIDER_SITE_OTHER): Payer: Medicare Other

## 2016-03-12 DIAGNOSIS — J4540 Moderate persistent asthma, uncomplicated: Secondary | ICD-10-CM | POA: Diagnosis not present

## 2016-03-12 MED ORDER — OMALIZUMAB 150 MG ~~LOC~~ SOLR
300.0000 mg | SUBCUTANEOUS | Status: DC
  Administered 2016-03-12: 300 mg via SUBCUTANEOUS

## 2016-03-17 ENCOUNTER — Telehealth: Payer: Self-pay | Admitting: Internal Medicine

## 2016-03-17 NOTE — Telephone Encounter (Signed)
#   vials:4 Ordered date:03/17/16 Shipping Date:03/17/16

## 2016-03-18 NOTE — Telephone Encounter (Signed)
#   Vials:4 Arrival Date:03/18/16 Lot [id number] Exp Date:4/12

## 2016-03-23 ENCOUNTER — Other Ambulatory Visit: Payer: Self-pay | Admitting: Internal Medicine

## 2016-03-23 DIAGNOSIS — Z1231 Encounter for screening mammogram for malignant neoplasm of breast: Secondary | ICD-10-CM

## 2016-03-26 ENCOUNTER — Ambulatory Visit (INDEPENDENT_AMBULATORY_CARE_PROVIDER_SITE_OTHER): Payer: Medicare Other

## 2016-03-26 DIAGNOSIS — J4520 Mild intermittent asthma, uncomplicated: Secondary | ICD-10-CM | POA: Diagnosis not present

## 2016-03-26 MED ORDER — OMALIZUMAB 150 MG ~~LOC~~ SOLR
300.0000 mg | SUBCUTANEOUS | Status: DC
  Administered 2016-03-26: 300 mg via SUBCUTANEOUS

## 2016-03-31 ENCOUNTER — Other Ambulatory Visit (HOSPITAL_COMMUNITY): Payer: Self-pay | Admitting: Nurse Practitioner

## 2016-04-01 DIAGNOSIS — R21 Rash and other nonspecific skin eruption: Secondary | ICD-10-CM | POA: Insufficient documentation

## 2016-04-01 NOTE — Assessment & Plan Note (Signed)
Nonspecific macular erythematous rash on forearms present only 3 weeks. Distribution strongly favors sun exposure. Plan-aloe, soothing ointment, skin protection. If rash persists see dermatologist

## 2016-04-01 NOTE — Assessment & Plan Note (Signed)
Moderate persistent uncomplicated now with Xolair control. Consider FENO future visit Conbtinue Xolair 2 week interval. Refill meds as needed.

## 2016-04-09 ENCOUNTER — Ambulatory Visit (INDEPENDENT_AMBULATORY_CARE_PROVIDER_SITE_OTHER): Payer: Medicare Other

## 2016-04-09 DIAGNOSIS — J4520 Mild intermittent asthma, uncomplicated: Secondary | ICD-10-CM

## 2016-04-09 MED ORDER — OMALIZUMAB 150 MG ~~LOC~~ SOLR
300.0000 mg | SUBCUTANEOUS | Status: DC
  Administered 2016-04-09: 300 mg via SUBCUTANEOUS

## 2016-04-16 ENCOUNTER — Telehealth: Payer: Self-pay | Admitting: Internal Medicine

## 2016-04-16 NOTE — Telephone Encounter (Signed)
#   vials:4 Ordered date:04/16/16 Shipping Date:04/19/16

## 2016-04-20 ENCOUNTER — Encounter: Payer: Self-pay | Admitting: Cardiology

## 2016-04-20 NOTE — Telephone Encounter (Addendum)
#   Vials:4 Arrival Date:04/20/16 Lot [id number] Exp. 5/21, [id number] Exp. 4/21 : this vial is from last week's shipment, it was mistaken for xolair. It should have been clearly marked [place].  Joellen Jersey is aware.

## 2016-04-23 ENCOUNTER — Ambulatory Visit (INDEPENDENT_AMBULATORY_CARE_PROVIDER_SITE_OTHER): Payer: Medicare Other

## 2016-04-23 DIAGNOSIS — J4540 Moderate persistent asthma, uncomplicated: Secondary | ICD-10-CM

## 2016-04-23 MED ORDER — OMALIZUMAB 150 MG ~~LOC~~ SOLR
300.0000 mg | SUBCUTANEOUS | Status: DC
  Administered 2016-04-23: 300 mg via SUBCUTANEOUS

## 2016-04-28 ENCOUNTER — Encounter: Payer: Self-pay | Admitting: Cardiology

## 2016-04-28 ENCOUNTER — Ambulatory Visit (INDEPENDENT_AMBULATORY_CARE_PROVIDER_SITE_OTHER): Payer: Medicare Other | Admitting: Cardiology

## 2016-04-28 VITALS — BP 122/74 | HR 63 | Ht 70.0 in | Wt 205.8 lb

## 2016-04-28 DIAGNOSIS — Z7901 Long term (current) use of anticoagulants: Secondary | ICD-10-CM | POA: Diagnosis not present

## 2016-04-28 DIAGNOSIS — I480 Paroxysmal atrial fibrillation: Secondary | ICD-10-CM

## 2016-04-28 DIAGNOSIS — I483 Typical atrial flutter: Secondary | ICD-10-CM

## 2016-04-28 NOTE — Patient Instructions (Signed)
Medication Instructions:  The current medical regimen is effective;  continue present plan and medications.  Follow-Up: Follow up in 6 months with Katy Thompson, PA.  You will receive a letter in the mail 2 months before you are due.  Please call us when you receive this letter to schedule your follow up appointment.  If you need a refill on your cardiac medications before your next appointment, please call your pharmacy.  Thank you for choosing [facility]!!     

## 2016-04-28 NOTE — Progress Notes (Addendum)
Cardiology Office Note    Date:  04/28/2016   ID:  Laura Mendez, Laura Mendez 1940-08-14, MRN [id number]  PCP:  Hoyt Koch, MD  Cardiologist:   Candee Furbish, MD   Chief Complaint  Patient presents with  . New Patient (Initial Visit)    History of Present Illness:  Laura Mendez is a 75 y.o. female here for evaluation of atrial fibrillation, recently seen by Roderic Palau, NP in atrial fibrillation clinic. She was in need to establish with cardiologist.  She is found to be in atrial flutter at 150 bpm associated with left sided chest pain and arm pain in the emergency room in July 2017. This began when laying in bed. Prednisone taper was being taken at the time. She was for a rash. No prior thyroid issues. 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 She spontaneously converted in the emergency room after going to the bathroom.  She's had asthma in the past and there is been hesitancy to utilize beta blockers. Her chadsvasc score was 5-changed to Xarelto.  Left atrium 55 mm, severely calcified mitral annulus.  Overall she's doing well, no chest pain, no shortness of breath. She does have seasonal allergies. Occasional she'll take Claritin for this.   She used to work for [employer] She needs a prior authorization for her Xarelto.   Past Medical History:  Diagnosis Date  . Chronic airway obstruction, not elsewhere classified   . Disorder of bone and cartilage, unspecified   . Routine general medical examination at a health care facility   . Unspecified asthma(493.90)     Past Surgical History:  Procedure Laterality Date  . ABDOMINAL HYSTERECTOMY      Current Medications: Outpatient Medications Prior to Visit  Medication Sig Dispense Refill  . albuterol (VENTOLIN HFA) 108 (90 BASE) MCG/ACT inhaler 2 puffs every four hours as needed- rescue 18 Inhaler 12  . diltiazem (CARDIZEM CD) 120 MG 24 hr capsule Take 1 capsule (120  mg total) by mouth daily. 90 capsule 3  . fish oil-omega-3 fatty acids 1000 MG capsule Take 2 g by mouth daily.      . Multiple Vitamin (MULTIVITAMIN) tablet Take 1 tablet by mouth daily.     Marland Kitchen omalizumab (XOLAIR) 150 MG injection Inject 300 mg into the skin every 14 (fourteen) days.      . rivaroxaban (XARELTO) 20 MG TABS tablet Take 1 tablet (20 mg total) by mouth daily with supper. 30 tablet 0  . diltiazem (CARDIZEM CD) 120 MG 24 hr capsule TAKE ONE CAPSULE BY MOUTH EVERY DAY 30 capsule 3  . ADVAIR DISKUS 250-50 MCG/DOSE AEPB INHALE 1 PUFF TWICE DAILY, RINSE AFTER USE 120 each 2   Facility-Administered Medications Prior to Visit  Medication Dose Route Frequency Provider Last Rate Last Dose  . omalizumab [name]) injection 300 mg  300 mg Subcutaneous Q14 Days Deneise Lever, MD   300 mg at 03/12/16 1359  . omalizumab [name]) injection 300 mg  300 mg Subcutaneous Q14 Days Deneise Lever, MD   300 mg at 03/26/16 0904  . omalizumab [name]) injection 300 mg  300 mg Subcutaneous Q14 Days Deneise Lever, MD   300 mg at 04/09/16 1210  . omalizumab [name]) injection 300 mg  300 mg Subcutaneous Q14 Days Deneise Lever, MD   300 mg at 04/23/16 1223     Allergies:   Ibuprofen; Clarithromycin; and Codeine phosphate  Social History   Social History  . Marital status: Divorced    Spouse name: N/A  . Number of children: N/A  . Years of education: N/A   Occupational History  . retired    Social History Main Topics  . Smoking status: Former Smoker    Years: 0.00    Quit date: 05/24/1977  . Smokeless tobacco: Never Used  . Alcohol use No  . Drug use: No  . Sexual activity: Not Asked   Other Topics Concern  . None   Social History Narrative  . None     Family History:  The patient's family history includes COPD in her father; Cancer in her mother; Kidney cancer in her sister; Liver disease in her sister; Lung cancer in her sister; Melanoma in her sister; Ovarian cancer in her  sister.  No early CAD.  ROS:   Please see the history of present illness.    ROS All other systems reviewed and are negative.   PHYSICAL EXAM:   VS:  BP 122/74 (BP Location: Left Arm)   Pulse 63   Ht 5\' 10"  ([id number] m)   Wt 205 lb 12.8 oz (93.4 kg)   BMI 29.53 kg/m    GEN: Well nourished, well developed, in no acute distress  HEENT: normal  Neck: no JVD, carotid bruits, or masses Cardiac: RRR; no murmurs, rubs, or gallops,no edema  Respiratory:  clear to auscultation bilaterally, normal work of breathing GI: soft, nontender, nondistended, + BS MS: no deformity or atrophy  Skin: warm and dry, no rash Neuro:  Alert and Oriented x 3, Strength and sensation are intact Psych: euthymic mood, full affect  Wt Readings from Last 3 Encounters:  04/28/16 205 lb 12.8 oz (93.4 kg)  01/20/16 203 lb 6.4 oz (92.3 kg)  01/13/16 199 lb (90.3 kg)      Studies/Labs Reviewed:   EKG:  EKG ordered today 04/28/16-sinus rhythm, nonspecific T-wave flattening, heart rate 63 bpm personally viewed-prior NSR at 77 bpm, with PAC's, pr int 120 ms, qrs int 84 ms, qtc 420 ms   Recent Labs: 12/11/2015: Magnesium 2.0 01/13/2016: ALT 18; BUN 17; Creatinine, Ser 0.88; Hemoglobin 15.0; Platelets 262.0; Potassium 4.6; Sodium 142; TSH 2.24   Lipid Panel    Component Value Date/Time   CHOL 214 (H) 01/13/2016 0922   TRIG 164.0 (H) 01/13/2016 0922   TRIG 183 (H) 03/04/2006 1034   HDL 52.00 01/13/2016 0922   CHOLHDL 4 01/13/2016 0922   VLDL 32.8 01/13/2016 0922   LDLCALC 129 (H) 01/13/2016 0922   LDLDIRECT 106.0 08/13/2015 0743    Additional studies/ records that were reviewed today include:  Prior office notes reviewed, lab work reviewed, EKGs reviewed  Echo-Left ventricle: The cavity size was mildly dilated. Wall thickness was increased in a pattern of mild LVH. Systolic function was normal. The estimated ejection fraction was in the range of 55% to 60%. Wall motion was normal; there were  no regional wall motion abnormalities. Doppler parameters are consistent with abnormal left ventricular relaxation (grade 1 diastolic dysfunction). Doppler parameters are consistent with high ventricular filling pressure. - Mitral valve: Severely calcified annulus. There was mild regurgitation. Valve area by continuity equation (using LVOT flow): 2.36 cm^2. - Left atrium: The atrium was severely dilated. - Right ventricle: The cavity size was normal. Wall thickness was normal. Systolic function was normal. - Right atrium: The atrium was mildly dilated. - Atrial septum: No defect or patent foramen ovale was identified by color flow Doppler. -  Tricuspid valve: There was trivial regurgitation. - Pulmonary arteries: Systolic pressure was within the normal range. PA peak pressure: 24 mm Hg (S).   ASSESSMENT:    1. Paroxysmal atrial fibrillation (HCC)   2. Typical atrial flutter ([place])   3. Chronic anticoagulation      PLAN:  In order of problems listed above:  Atrial flutter/fibrillation  - Xarelto, Cardizem 120 a day.  - Severely dilated left atrium.  - May have been exacerbated previously by steroid during rash.  - Auto converted previously.  Chronic anticoagulation  - Continue with Xarelto.  - CHADS-VASC - 5 (age, female, TIA-left eye blindness)  Seasonal allergies  - Be careful with prednisone in the future. Also instructed her to be careful with decongestant such as Sudafed.  Chest discomfort  - This was associated with her tachycardia arrhythmia  - If this returns, consider nuclear stress test.    Medication Adjustments/Labs and Tests Ordered: Current medicines are reviewed at length with the patient today.  Concerns regarding medicines are outlined above.  Medication changes, Labs and Tests ordered today are listed in the Patient Instructions below. Patient Instructions  Medication Instructions:  The current medical regimen is effective;   continue present plan and medications.  Follow-Up: Follow up in 6 months with Bonney Leitz, PA.  You will receive a letter in the mail 2 months before you are due.  Please call us when you receive this letter to schedule your follow up appointment.  If you need a refill on your cardiac medications before your next appointment, please call your pharmacy.  Thank you for choosing [facility]!!        Signed, Candee Furbish, MD  04/28/2016 9:01 AM    [facility] [address], [place], [place]  [address] Phone: [phone]; Fax: [phone]

## 2016-04-29 ENCOUNTER — Telehealth: Payer: Self-pay | Admitting: Cardiology

## 2016-04-29 NOTE — Telephone Encounter (Signed)
Walk In Patient Form-medication List-Dropped off gave to [place] to take deliver.

## 2016-05-03 ENCOUNTER — Ambulatory Visit
Admission: RE | Admit: 2016-05-03 | Discharge: 2016-05-03 | Disposition: A | Discharge status: Home / Self Care | Payer: Medicare Other | Source: Ambulatory Visit | Attending: Internal Medicine | Admitting: Internal Medicine

## 2016-05-03 ENCOUNTER — Ambulatory Visit: Payer: Medicare Other

## 2016-05-03 DIAGNOSIS — Z1231 Encounter for screening mammogram for malignant neoplasm of breast: Secondary | ICD-10-CM | POA: Diagnosis not present

## 2016-05-03 IMAGING — MG DIGITAL SCREENING BILATERAL MAMMOGRAM WITH CAD
4 series · 4 of 4 positions shown · non-contrast
Comparison: Previous exam(s).

CLINICAL DATA: Screening.

EXAM:
DIGITAL SCREENING BILATERAL MAMMOGRAM WITH CAD

[R CC]
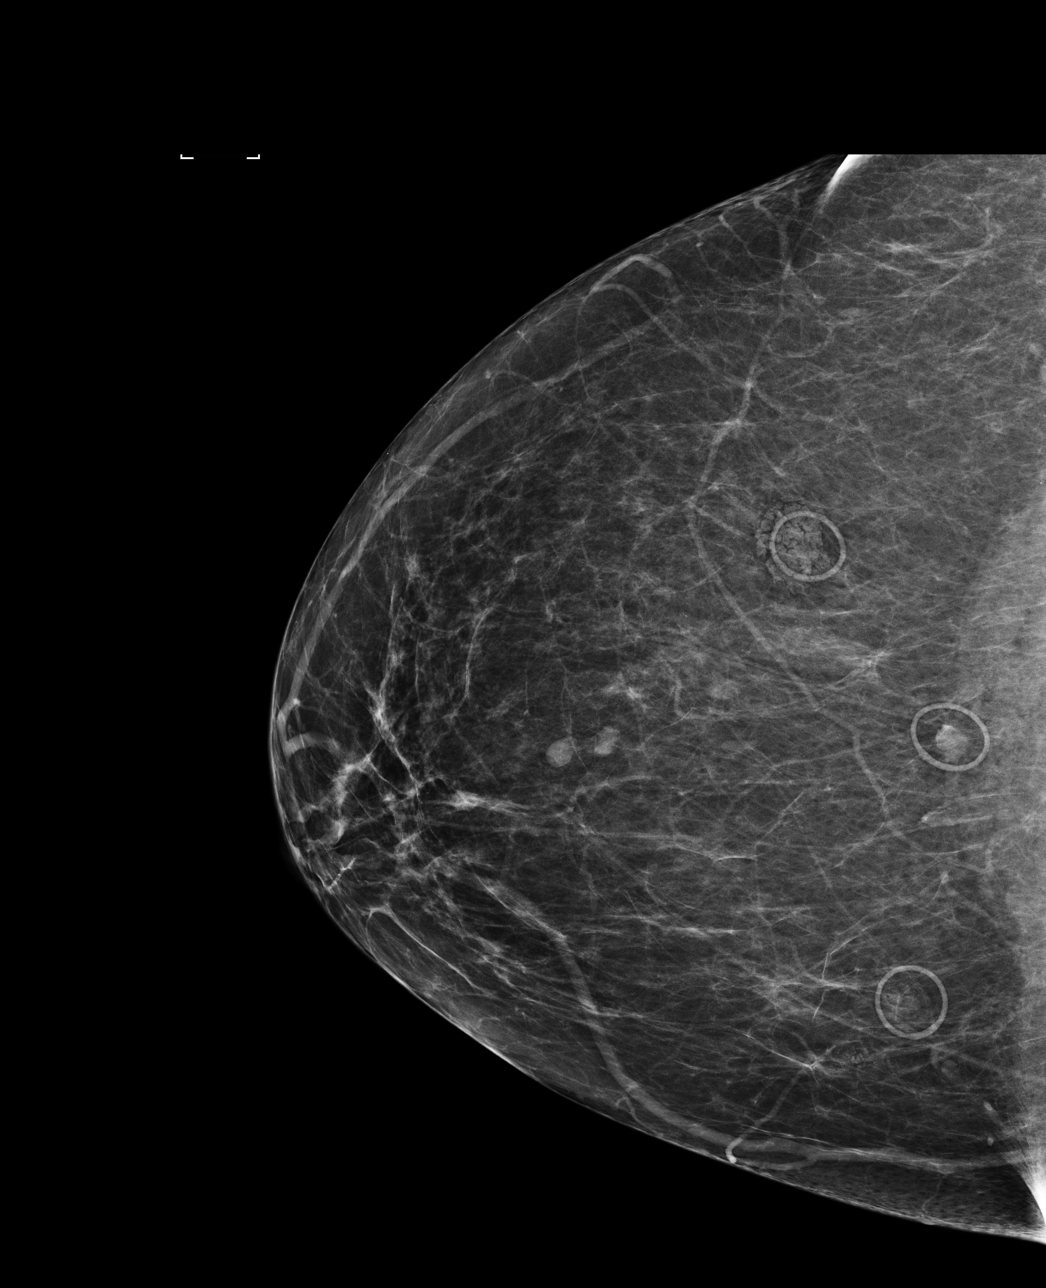

[R MLO]
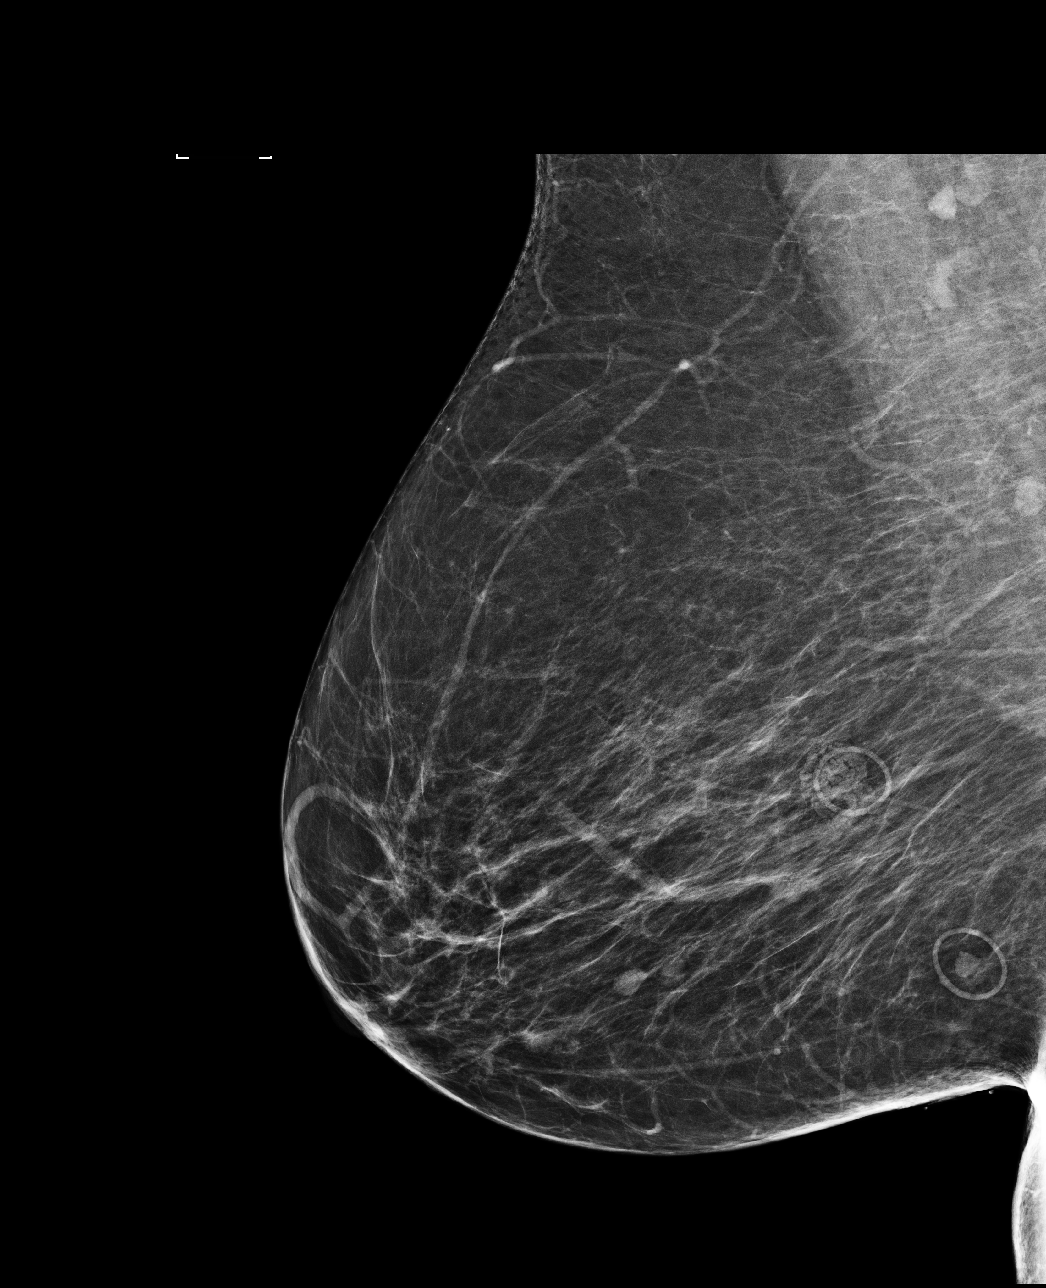

[L MLO]
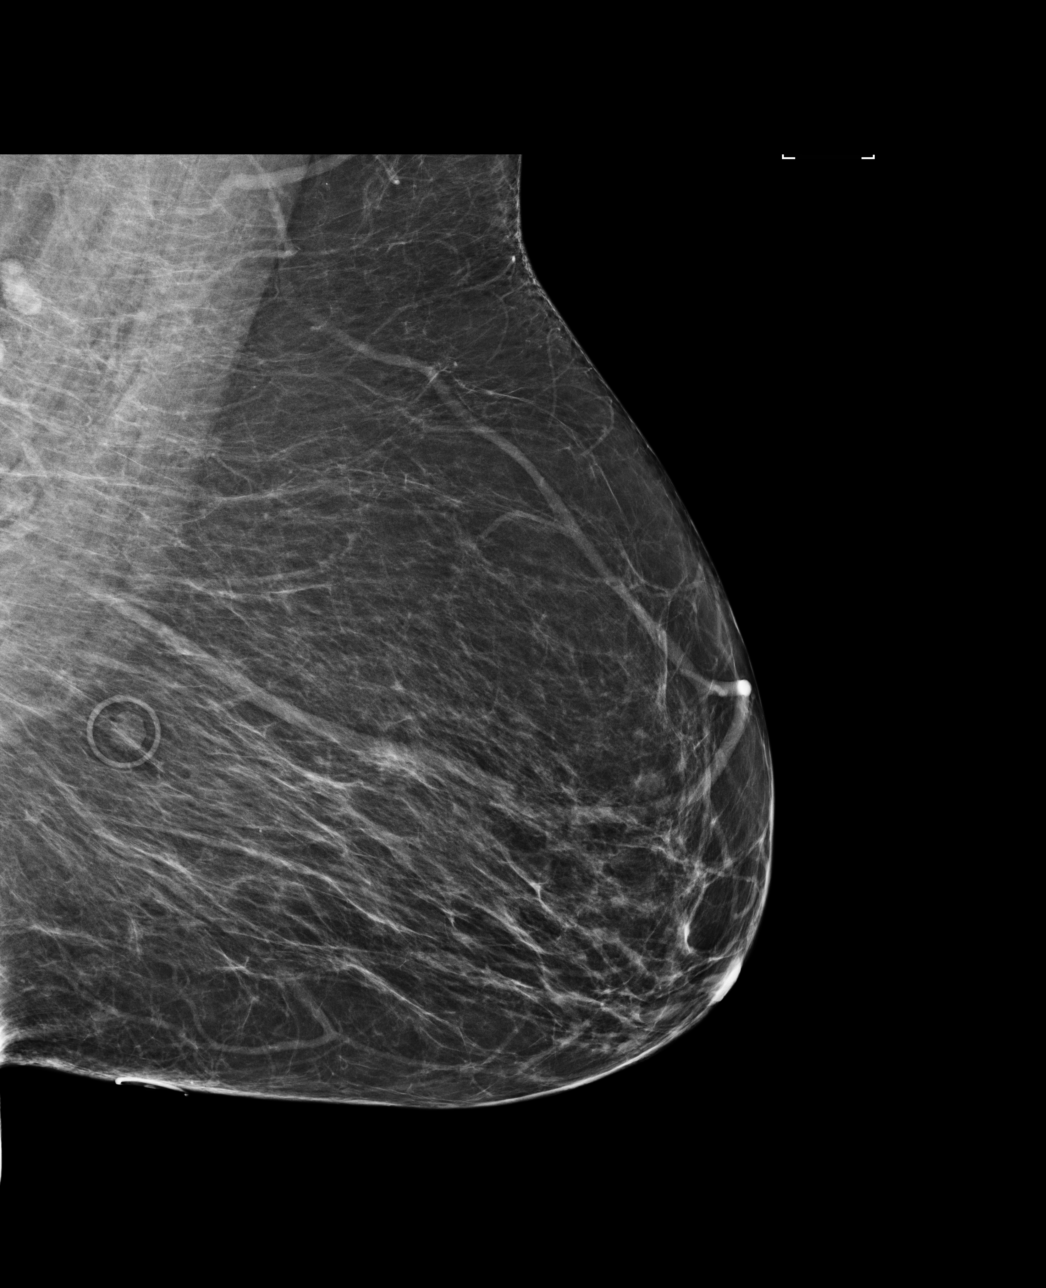

[L CC]
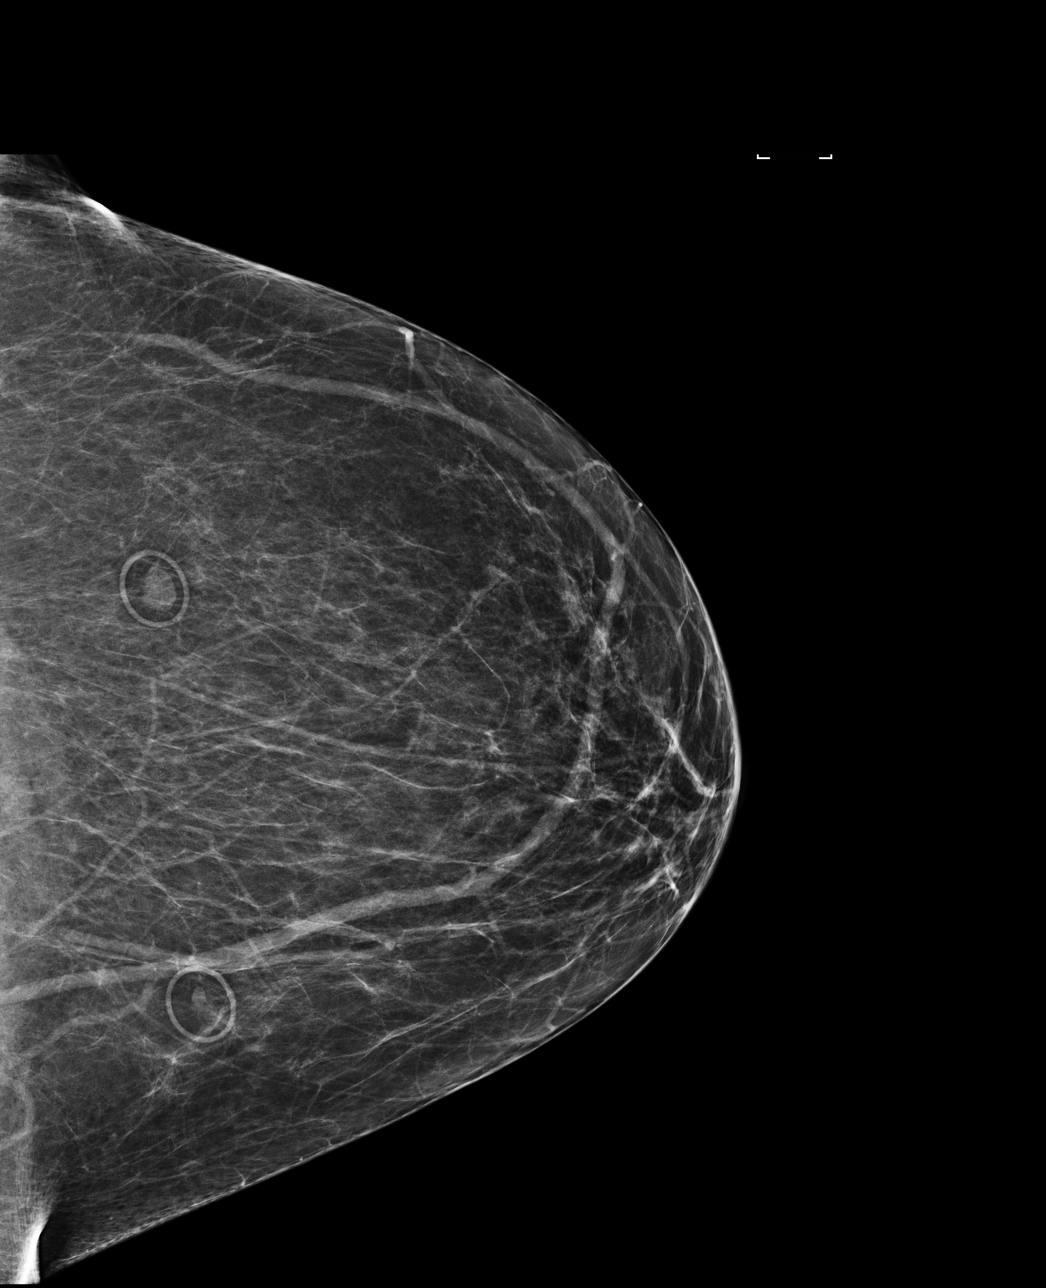

[4 of 4 positions shown; findings below may reference images not displayed]

ACR Breast Density Category b: There are scattered areas of
fibroglandular density.
FINDINGS: There are no findings suspicious for malignancy. Images were
processed with CAD.
IMPRESSION: No mammographic evidence of malignancy. A result letter of this
screening mammogram will be mailed directly to the patient.

RECOMMENDATION:
Screening mammogram in one year. (Code:[US])

BI-RADS CATEGORY  1: Negative.

## 2016-05-07 ENCOUNTER — Ambulatory Visit (INDEPENDENT_AMBULATORY_CARE_PROVIDER_SITE_OTHER): Payer: Medicare Other

## 2016-05-07 DIAGNOSIS — J4540 Moderate persistent asthma, uncomplicated: Secondary | ICD-10-CM | POA: Diagnosis not present

## 2016-05-18 ENCOUNTER — Telehealth: Payer: Self-pay | Admitting: Internal Medicine

## 2016-05-18 MED ORDER — OMALIZUMAB 150 MG ~~LOC~~ SOLR
300.0000 mg | SUBCUTANEOUS | Status: DC
  Administered 2016-05-07: 300 mg via SUBCUTANEOUS

## 2016-05-18 NOTE — Telephone Encounter (Signed)
#   vials:4 Ordered date:05/18/16 Shipping Date:05/19/16 

## 2016-05-20 NOTE — Telephone Encounter (Signed)
#   Vials:4 Arrival Date:05/19/16 Lot [id number] Exp Date:6/21

## 2016-05-21 ENCOUNTER — Ambulatory Visit (INDEPENDENT_AMBULATORY_CARE_PROVIDER_SITE_OTHER): Payer: Medicare Other

## 2016-05-21 DIAGNOSIS — J4540 Moderate persistent asthma, uncomplicated: Secondary | ICD-10-CM | POA: Diagnosis not present

## 2016-05-25 ENCOUNTER — Ambulatory Visit: Payer: Medicare Other | Admitting: Internal Medicine

## 2016-06-04 ENCOUNTER — Ambulatory Visit (INDEPENDENT_AMBULATORY_CARE_PROVIDER_SITE_OTHER): Payer: Medicare Other

## 2016-06-04 DIAGNOSIS — J4540 Moderate persistent asthma, uncomplicated: Secondary | ICD-10-CM | POA: Diagnosis not present

## 2016-06-04 MED ORDER — OMALIZUMAB 150 MG ~~LOC~~ SOLR
300.0000 mg | SUBCUTANEOUS | Status: DC
  Administered 2016-06-04: 300 mg via SUBCUTANEOUS

## 2016-06-09 ENCOUNTER — Ambulatory Visit: Payer: Medicare Other | Admitting: Internal Medicine

## 2016-06-11 ENCOUNTER — Ambulatory Visit: Payer: Medicare Other | Admitting: Internal Medicine

## 2016-06-11 ENCOUNTER — Telehealth: Payer: Self-pay | Admitting: Internal Medicine

## 2016-06-14 ENCOUNTER — Other Ambulatory Visit (HOSPITAL_COMMUNITY): Payer: Self-pay | Admitting: Cardiology

## 2016-06-14 NOTE — Telephone Encounter (Signed)
#   Vials:4 Arrival Date:06/14/16 Lot [id number] Exp Date:8/21

## 2016-06-14 NOTE — Telephone Encounter (Signed)
#   vials:4 Ordered date:06/11/16 Shipping Date:06/15/16

## 2016-06-18 ENCOUNTER — Ambulatory Visit: Payer: Medicare Other

## 2016-06-18 ENCOUNTER — Ambulatory Visit (INDEPENDENT_AMBULATORY_CARE_PROVIDER_SITE_OTHER): Payer: Medicare Other

## 2016-06-18 DIAGNOSIS — J4520 Mild intermittent asthma, uncomplicated: Secondary | ICD-10-CM | POA: Diagnosis not present

## 2016-06-18 MED ORDER — OMALIZUMAB 150 MG ~~LOC~~ SOLR
300.0000 mg | SUBCUTANEOUS | Status: DC
  Administered 2016-06-18: 300 mg via SUBCUTANEOUS

## 2016-06-21 ENCOUNTER — Ambulatory Visit (INDEPENDENT_AMBULATORY_CARE_PROVIDER_SITE_OTHER): Payer: Medicare Other | Admitting: Internal Medicine

## 2016-06-21 ENCOUNTER — Encounter: Payer: Self-pay | Admitting: Internal Medicine

## 2016-06-21 VITALS — BP 125/78 | HR 66 | Ht 70.0 in | Wt 208.8 lb

## 2016-06-21 DIAGNOSIS — J302 Other seasonal allergic rhinitis: Secondary | ICD-10-CM

## 2016-06-21 DIAGNOSIS — I480 Paroxysmal atrial fibrillation: Secondary | ICD-10-CM | POA: Diagnosis not present

## 2016-06-21 DIAGNOSIS — J3089 Other allergic rhinitis: Secondary | ICD-10-CM | POA: Diagnosis not present

## 2016-06-21 DIAGNOSIS — J449 Chronic obstructive pulmonary disease, unspecified: Secondary | ICD-10-CM | POA: Diagnosis not present

## 2016-06-21 NOTE — Patient Instructions (Signed)
We can continue Xolair and your regular inhalers  Please call as needed

## 2016-06-21 NOTE — Progress Notes (Signed)
Patient ID: Laura Mendez, female    DOB: July 03, 1940, 76 y.o.   MRN: [id number]  HPI F former smoker followed for Severe asthma/ COPD FEV1 47%, Xolair ---------------------------------------------------------------  11/06/2015-76 year old female former smoker followed for severe asthma/COPD/Xolair FOLLOWS FOR: Still on Xolair injections and doing well. Financial support for Xolair finally got worked out again. She strongly believes this medication has controlled previously severe and complicated asthma component of her chronic lung disease. Last year tried increasing to 3 week interval and had exacerbation. Feels well, paces self for exertion. Incidental problem-rash-using triamcinolone 0.1% for nonspecific rash on arm  06/21/2016-76 year old female former smoker followed for severe asthma/COPD/Xolair, complicated by allergic rhinitis, paroxysmal A. Fib FOLLOWS FOR: Still on Xolair injections, doing well. Pt reports exercising 5 times a week at the gym.  Says she is "doing fine" with Xolair. Using rescue inhaler less than 1 time per week with some seasonal variation. Continues Advair 250. Going to [employer] exercise 5 days per week and out to church each Sunday. This is more time out of the house than she used to feel safe with. Little routine cough or phlegm and no sleep disturbance.  Review of Systems-see HPI   + = pos  Constitutional:   No-   weight loss, night sweats, fevers, chills, fatigue, lassitude. HEENT:   No-  headaches, difficulty swallowing, tooth/dental problems, sore throat,       No-  sneezing, itching, ear ache,  nasal congestion, +post nasal drip,  CV:  No-   chest pain, orthopnea, PND, swelling in lower extremities, anasarca, dizziness, palpitations Resp:  +Some shortness of breath with exertion or at rest- unchanged             No-   productive cough,  No non-productive cough,  No-  coughing up of blood.              No-   change in color of mucus.  No- wheezing.    Skin:  rash or lesions. GI:  No-   heartburn, indigestion, abdominal pain, nausea, vomiting, GU:  MS:  +   joint pain or swelling.   Neuro- nothing unusual  Psych:  No- change in mood or affect. No depression or anxiety.  No memory loss  Objective:     General- Alert, Oriented, Affect-appropriate, Distress- none acute, Pleasant Skin- No rash Lymphadenopathy- none Head- atraumatic            Eyes- Gross vision intact, PERRLA, conjunctivae clear secretions            Ears- Hearing, canals normal            Nose- Mild turbinate edema, No-Septal dev, mucus, polyps, erosion, perforation             Throat- Mallampati II , mucosa -not red, drainage- none, tonsils- atrophic Neck- flexible , trachea midline, no stridor , thyroid nl, carotid no bruit Chest - symmetrical excursion , unlabored           Heart/CV- RRR , no murmur , no gallop  , no rub, nl s1 s2                           - JVD- none , edema- none, stasis changes- none, varices- none           Lung- +clear to P&A/ distant/unlabored, wheeze- none, cough-none, dullness-none, rub- none  Chest wall-  Abd-  Br/ Gen/ Rectal- Not done, not indicated Extrem- cyanosis- none, clubbing, none, atrophy- none, strength- nl Neuro- grossly intact to observation

## 2016-06-27 NOTE — Assessment & Plan Note (Signed)
She continues to feel very stable, dramatically better than her quality of life for Xolair. She wants to continue Xolair and current inhalers

## 2016-06-27 NOTE — Assessment & Plan Note (Signed)
Rhythm is regular by exam at this visit consistent with sinus. Followed by cardiology.

## 2016-06-27 NOTE — Assessment & Plan Note (Signed)
If needed with upcoming spring pollen season she will use Flonase and OTC antihistamines

## 2016-07-02 ENCOUNTER — Ambulatory Visit (INDEPENDENT_AMBULATORY_CARE_PROVIDER_SITE_OTHER): Payer: Medicare Other

## 2016-07-02 DIAGNOSIS — J4520 Mild intermittent asthma, uncomplicated: Secondary | ICD-10-CM | POA: Diagnosis not present

## 2016-07-06 ENCOUNTER — Telehealth: Payer: Self-pay | Admitting: Internal Medicine

## 2016-07-06 MED ORDER — OMALIZUMAB 150 MG ~~LOC~~ SOLR
300.0000 mg | SUBCUTANEOUS | Status: DC
  Administered 2016-07-02 – 2016-07-16 (×2): 300 mg via SUBCUTANEOUS

## 2016-07-06 NOTE — Progress Notes (Signed)
Documentation of medication administration of Xolair and charges have been completed by Lyndol Vanderheiden, CMA based on the Xolair documentation sheet completed by Tammy Scott.  

## 2016-07-06 NOTE — Telephone Encounter (Signed)
#   vials:4 Ordered date:07/06/16 Shipping Date:07/06/16 

## 2016-07-07 NOTE — Telephone Encounter (Signed)
#   Vials:4 Arrival Date:07/07/16 Lot #[id number] Exp Date:9/21  

## 2016-07-16 ENCOUNTER — Ambulatory Visit (INDEPENDENT_AMBULATORY_CARE_PROVIDER_SITE_OTHER): Payer: Medicare Other

## 2016-07-16 DIAGNOSIS — J4550 Severe persistent asthma, uncomplicated: Secondary | ICD-10-CM

## 2016-07-16 DIAGNOSIS — J4520 Mild intermittent asthma, uncomplicated: Secondary | ICD-10-CM

## 2016-07-18 ENCOUNTER — Other Ambulatory Visit: Payer: Self-pay | Admitting: Internal Medicine

## 2016-07-20 DIAGNOSIS — H5203 Hypermetropia, bilateral: Secondary | ICD-10-CM | POA: Diagnosis not present

## 2016-07-20 DIAGNOSIS — H2513 Age-related nuclear cataract, bilateral: Secondary | ICD-10-CM | POA: Diagnosis not present

## 2016-07-20 DIAGNOSIS — H349 Unspecified retinal vascular occlusion: Secondary | ICD-10-CM | POA: Diagnosis not present

## 2016-07-21 ENCOUNTER — Other Ambulatory Visit: Payer: Self-pay | Admitting: Internal Medicine

## 2016-07-21 MED ORDER — OMALIZUMAB 150 MG ~~LOC~~ SOLR: 300.0000 mg | Freq: Once | SUBCUTANEOUS | Status: DC

## 2016-07-28 DIAGNOSIS — R200 Anesthesia of skin: Secondary | ICD-10-CM | POA: Diagnosis not present

## 2016-07-28 DIAGNOSIS — M25571 Pain in right ankle and joints of right foot: Secondary | ICD-10-CM | POA: Diagnosis not present

## 2016-07-29 MED ORDER — OMALIZUMAB 150 MG ~~LOC~~ SOLR
300.0000 mg | Freq: Once | SUBCUTANEOUS | Status: AC
  Administered 2016-05-21: 300 mg via SUBCUTANEOUS

## 2016-07-30 ENCOUNTER — Ambulatory Visit (INDEPENDENT_AMBULATORY_CARE_PROVIDER_SITE_OTHER): Payer: Medicare Other

## 2016-07-30 DIAGNOSIS — J4540 Moderate persistent asthma, uncomplicated: Secondary | ICD-10-CM | POA: Diagnosis not present

## 2016-07-30 MED ORDER — OMALIZUMAB 150 MG ~~LOC~~ SOLR
300.0000 mg | Freq: Once | SUBCUTANEOUS | Status: AC
  Administered 2016-07-30: 300 mg via SUBCUTANEOUS

## 2016-08-03 ENCOUNTER — Telehealth: Payer: Self-pay | Admitting: Internal Medicine

## 2016-08-04 NOTE — Telephone Encounter (Signed)
#   Vials:4 Arrival Date:08/04/16 Lot #[id number] Exp Date:9/21  

## 2016-08-04 NOTE — Telephone Encounter (Signed)
#   vials:4 Ordered date:08/03/16 Shipping Date:08/03/16 

## 2016-08-13 ENCOUNTER — Ambulatory Visit (INDEPENDENT_AMBULATORY_CARE_PROVIDER_SITE_OTHER): Payer: Medicare Other

## 2016-08-13 DIAGNOSIS — J4540 Moderate persistent asthma, uncomplicated: Secondary | ICD-10-CM | POA: Diagnosis not present

## 2016-08-17 MED ORDER — OMALIZUMAB 150 MG ~~LOC~~ SOLR
300.0000 mg | Freq: Once | SUBCUTANEOUS | Status: AC
  Administered 2016-08-13: 300 mg via SUBCUTANEOUS

## 2016-08-27 ENCOUNTER — Ambulatory Visit (INDEPENDENT_AMBULATORY_CARE_PROVIDER_SITE_OTHER): Payer: Medicare Other

## 2016-08-27 DIAGNOSIS — J4540 Moderate persistent asthma, uncomplicated: Secondary | ICD-10-CM

## 2016-08-27 MED ORDER — OMALIZUMAB 150 MG ~~LOC~~ SOLR
300.0000 mg | Freq: Once | SUBCUTANEOUS | Status: AC
  Administered 2016-08-27: 300 mg via SUBCUTANEOUS

## 2016-09-01 ENCOUNTER — Telehealth: Payer: Self-pay | Admitting: Internal Medicine

## 2016-09-01 NOTE — Telephone Encounter (Signed)
#   vials:4 Ordered date:09/01/16 Shipping Date:09/01/16

## 2016-09-02 NOTE — Telephone Encounter (Signed)
#   Vials:4 Arrival Date:09/02/16 Lot #[id number] Exp Date:9/21

## 2016-09-10 ENCOUNTER — Ambulatory Visit (INDEPENDENT_AMBULATORY_CARE_PROVIDER_SITE_OTHER): Payer: Medicare Other

## 2016-09-10 DIAGNOSIS — J4540 Moderate persistent asthma, uncomplicated: Secondary | ICD-10-CM | POA: Diagnosis not present

## 2016-09-14 DIAGNOSIS — J4540 Moderate persistent asthma, uncomplicated: Secondary | ICD-10-CM | POA: Diagnosis not present

## 2016-09-14 MED ORDER — OMALIZUMAB 150 MG ~~LOC~~ SOLR
300.0000 mg | Freq: Once | SUBCUTANEOUS | Status: AC
  Administered 2016-09-14: 300 mg via SUBCUTANEOUS

## 2016-09-24 ENCOUNTER — Ambulatory Visit (INDEPENDENT_AMBULATORY_CARE_PROVIDER_SITE_OTHER): Payer: Medicare Other

## 2016-09-24 DIAGNOSIS — J449 Chronic obstructive pulmonary disease, unspecified: Secondary | ICD-10-CM

## 2016-09-28 ENCOUNTER — Telehealth: Payer: Self-pay | Admitting: Internal Medicine

## 2016-09-28 NOTE — Telephone Encounter (Signed)
#   vials:4 Ordered date:09/28/16 Shipping Date:09/28/16 

## 2016-09-29 NOTE — Telephone Encounter (Signed)
#   Vials:4 Arrival Date:09/29/16 Lot #:[id number] Exp Date:10/21  

## 2016-10-05 ENCOUNTER — Other Ambulatory Visit: Payer: Self-pay | Admitting: Internal Medicine

## 2016-10-12 ENCOUNTER — Ambulatory Visit (INDEPENDENT_AMBULATORY_CARE_PROVIDER_SITE_OTHER): Payer: Medicare Other

## 2016-10-12 DIAGNOSIS — J4540 Moderate persistent asthma, uncomplicated: Secondary | ICD-10-CM

## 2016-10-15 MED ORDER — OMALIZUMAB 150 MG ~~LOC~~ SOLR
150.0000 mg | Freq: Once | SUBCUTANEOUS | Status: AC
  Administered 2016-10-12: 150 mg via SUBCUTANEOUS

## 2016-10-26 NOTE — Progress Notes (Signed)
Cardiology Office Note    Date:  10/26/2016   ID:  [name], DOB 1940-11-14, MRN [id number]  PCP:  Hoyt Koch, MD  Cardiologist:  Dr. Marlou Porch  CC: 6 month follow up.   History of Present Illness:  Laura Mendez is a 76 y.o. female with a history of COPD, asthma, TIA, PAFib/flutter on Xarelto who presents to clinic for follow up.   She was initially diagnosed with afib in 11/2015. She presented to the ER with chest/arm pain and found to be in afib with RVR. She was on a prednisone taper for a rash at that time. She spontaneously converted in the ER but was placed on Xarelto for a CHADSVASC of at least 5. She was also placed on a BB which was later converted to a CCB given her history of severe extrinsic asthma. 2D ECHO in 12/2015 showed normal LV function, mild LVH, severely calcified mitral valve annulus, mild RA dilation and severely dilated LA ( 55 mm).   She was followed by Laura Mendez in the afib clinic and later established care with Dr. Marlou Porch in 04/2016 and was doing well.  Today she presents to clinic for follow up. She brings in BP readings since Jan and BPs running average ~110/60s. She is very active and works out 4x a week at [employer]. She is very active around the house. No CP or SOB. No LE edema, orthopnea or PND. No dizziness or syncope. No blood in stool or urine. No palpitations.     Past Medical History:  Diagnosis Date  . Chronic airway obstruction, not elsewhere classified   . Disorder of bone and cartilage, unspecified   . Routine general medical examination at a health care facility   . Unspecified asthma(493.90)     Past Surgical History:  Procedure Laterality Date  . ABDOMINAL HYSTERECTOMY      Current Medications: Outpatient Medications Prior to Visit  Medication Sig Dispense Refill  . ADVAIR DISKUS 250-50 MCG/DOSE AEPB INHALE 1 PUFF TWICE DAILY, RINSE AFTER USE 180 each 3  . diltiazem (CARDIZEM CD) 120 MG 24 hr capsule Take 1 capsule  (120 mg total) by mouth daily. 90 capsule 3  . fish oil-omega-3 fatty acids 1000 MG capsule Take 2 g by mouth daily.      . Fluticasone-Salmeterol (ADVAIR) 250-50 MCG/DOSE AEPB Inhale 1 puff into the lungs 2 (two) times daily.    . Multiple Vitamin (MULTIVITAMIN) tablet Take 1 tablet by mouth daily.     . VENTOLIN HFA 108 (90 Base) MCG/ACT inhaler TAKE 2 PUFFS EVERY 4 HOURS AS NEEDED (RESCUE) 18 Inhaler 3  . XARELTO 20 MG TABS tablet TAKE 1 TABLET BY MOUTH EVERY DAY WITH SUPPER 30 tablet 5   No facility-administered medications prior to visit.      Allergies:   Ibuprofen; Clarithromycin; and Codeine phosphate   Social History   Social History  . Marital status: Divorced    Spouse name: N/A  . Number of children: N/A  . Years of education: N/A   Occupational History  . retired    Social History Main Topics  . Smoking status: Former Smoker    Years: 0.00    Quit date: 05/24/1977  . Smokeless tobacco: Never Used  . Alcohol use No  . Drug use: No  . Sexual activity: Not on file   Other Topics Concern  . Not on file   Social History Narrative  . No narrative on file  Family History:  The patient's family history includes COPD in her father; Cancer in her mother; Kidney cancer in her sister; Liver disease in her sister; Lung cancer in her sister; Melanoma in her sister; Ovarian cancer in her sister.      ROS:   Please see the history of present illness.    ROS All other systems reviewed and are negative.   PHYSICAL EXAM:   VS:  BP 120/74   Pulse 61   Ht 5\' 10"  (1.778 m)   Wt 205 lb 6.4 oz (93.2 kg)   BMI 29.47 kg/m    GEN: Well nourished, well developed, in no acute distress HEENT: normal  Neck: no JVD, carotid bruits, or masses Cardiac: RRR; no murmurs, rubs, or gallops,no edema  Respiratory:  clear to auscultation bilaterally, normal work of breathing GI: soft, nontender, nondistended, + BS MS: no deformity or atrophy  Skin: warm and dry, no rash Neuro:   Alert and Oriented x 3, Strength and sensation are intact Psych: euthymic mood, full affect    Wt Readings from Last 3 Encounters:  06/21/16 208 lb 12.8 oz (94.7 kg)  04/28/16 205 lb 12.8 oz (93.4 kg)  01/20/16 203 lb 6.4 oz (92.3 kg)      Studies/Labs Reviewed:   EKG:  EKG is ordered today.  The ekg ordered today demonstrates NSR HR 61  Recent Labs: 12/11/2015: Magnesium 2.0 01/13/2016: ALT 18; BUN 17; Creatinine, Ser 0.88; Hemoglobin 15.0; Platelets 262.0; Potassium 4.6; Sodium 142; TSH 2.24   Lipid Panel    Component Value Date/Time   CHOL 214 (H) 01/13/2016 0922   TRIG 164.0 (H) 01/13/2016 0922   TRIG 183 (H) 03/04/2006 1034   HDL 52.00 01/13/2016 0922   CHOLHDL 4 01/13/2016 0922   VLDL 32.8 01/13/2016 0922   LDLCALC 129 (H) 01/13/2016 0922   LDLDIRECT 106.0 08/13/2015 0743    Additional studies/ records that were reviewed today include:  2D ECHO: 12/29/2015 LV EF: 55% -   60% Study Conclusions - Left ventricle: The cavity size was mildly dilated. Wall   thickness was increased in a pattern of mild LVH. Systolic   function was normal. The estimated ejection fraction was in the   range of 55% to 60%. Wall motion was normal; there were no   regional wall motion abnormalities. Doppler parameters are   consistent with abnormal left ventricular relaxation (grade 1   diastolic dysfunction). Doppler parameters are consistent with   high ventricular filling pressure. - Mitral valve: Severely calcified annulus. There was mild   regurgitation. Valve area by continuity equation (using LVOT   flow): 2.36 cm^2. - Left atrium: The atrium was severely dilated. - Right ventricle: The cavity size was normal. Wall thickness was   normal. Systolic function was normal. - Right atrium: The atrium was mildly dilated. - Atrial septum: No defect or patent foramen ovale was identified   by color flow Doppler. - Tricuspid valve: There was trivial regurgitation. - Pulmonary arteries:  Systolic pressure was within the normal   range. PA peak pressure: 24 mm Hg (S).    ASSESSMENT & PLAN:   PAF: maintaining NSR. Continue Cardizem CD 120mg  daily. Continue Xarelto 20mg  daily for CHADSCVASC of at least (age, F sex and hx of TIA).   Hx of TIA: continue Xarelto.   COPD/Asthma: followed by Dr.Young   Medication Adjustments/Labs and Tests Ordered: Current medicines are reviewed at length with the patient today.  Concerns regarding medicines are outlined above.  Medication  changes, Labs and Tests ordered today are listed in the Patient Instructions below. There are no Patient Instructions on file for this visit.   Signed, Angelena Form, PA-C  10/26/2016 7:08 PM    [facility] [place], [place], [place]  [address] Phone: [phone]; Fax: [phone]

## 2016-10-27 ENCOUNTER — Encounter: Payer: Self-pay | Admitting: Physician Assistant

## 2016-10-27 ENCOUNTER — Ambulatory Visit (INDEPENDENT_AMBULATORY_CARE_PROVIDER_SITE_OTHER): Payer: Medicare Other | Admitting: Physician Assistant

## 2016-10-27 ENCOUNTER — Ambulatory Visit: Payer: Medicare Other | Admitting: Physician Assistant

## 2016-10-27 VITALS — BP 120/74 | HR 61 | Ht 70.0 in | Wt 205.4 lb

## 2016-10-27 DIAGNOSIS — I480 Paroxysmal atrial fibrillation: Secondary | ICD-10-CM | POA: Diagnosis not present

## 2016-10-27 DIAGNOSIS — Z7901 Long term (current) use of anticoagulants: Secondary | ICD-10-CM

## 2016-10-27 DIAGNOSIS — Z8673 Personal history of transient ischemic attack (TIA), and cerebral infarction without residual deficits: Secondary | ICD-10-CM | POA: Diagnosis not present

## 2016-10-27 DIAGNOSIS — J45909 Unspecified asthma, uncomplicated: Secondary | ICD-10-CM | POA: Diagnosis not present

## 2016-10-27 NOTE — Patient Instructions (Addendum)
Medication Instructions:  Your physician recommends that you continue on your current medications as directed. Please refer to the Current Medication list given to you today.   Labwork: None ordered  Testing/Procedures: None ordered  Follow-Up: Your physician wants you to follow-up in: 6 MONTHS WITH DR. SKAINS. You will receive a reminder letter in the mail two months in advance. If you don't receive a letter, please call our office to schedule the follow-up appointment.   Any Other Special Instructions Will Be Listed Below (If Applicable).     If you need a refill on your cardiac medications before your next appointment, please call your pharmacy.   

## 2016-10-29 ENCOUNTER — Ambulatory Visit (INDEPENDENT_AMBULATORY_CARE_PROVIDER_SITE_OTHER): Payer: Medicare Other

## 2016-10-29 DIAGNOSIS — J449 Chronic obstructive pulmonary disease, unspecified: Secondary | ICD-10-CM

## 2016-11-01 MED ORDER — OMALIZUMAB 150 MG ~~LOC~~ SOLR
300.0000 mg | Freq: Once | SUBCUTANEOUS | Status: AC
  Administered 2016-10-29: 300 mg via SUBCUTANEOUS

## 2016-11-02 ENCOUNTER — Telehealth: Payer: Self-pay | Admitting: Internal Medicine

## 2016-11-02 MED ORDER — OMALIZUMAB 150 MG ~~LOC~~ SOLR
300.0000 mg | Freq: Once | SUBCUTANEOUS | Status: AC
Start: 2016-09-24 — End: 2016-09-24
  Administered 2016-09-24: 300 mg via SUBCUTANEOUS

## 2016-11-02 NOTE — Telephone Encounter (Signed)
#   vials:4 Ordered date:11/02/16 Shipping Date:11/02/16

## 2016-11-03 NOTE — Telephone Encounter (Signed)
#   Vials:4 Arrival Date:11/03/16 Lot #[id number] (1) Exp Date:11/21

## 2016-11-08 DIAGNOSIS — L308 Other specified dermatitis: Secondary | ICD-10-CM | POA: Diagnosis not present

## 2016-11-12 ENCOUNTER — Ambulatory Visit (INDEPENDENT_AMBULATORY_CARE_PROVIDER_SITE_OTHER): Payer: Medicare Other

## 2016-11-12 DIAGNOSIS — J4540 Moderate persistent asthma, uncomplicated: Secondary | ICD-10-CM

## 2016-11-15 MED ORDER — OMALIZUMAB 150 MG ~~LOC~~ SOLR
300.0000 mg | SUBCUTANEOUS | Status: DC
  Administered 2016-11-12: 300 mg via SUBCUTANEOUS

## 2016-11-15 NOTE — Progress Notes (Signed)
Xolair injection documentation and charges entered by Ashley Caulfield, RMA, based on injection sheet filled out by Tammy Scott per office protocol.   

## 2016-11-22 ENCOUNTER — Telehealth: Payer: Self-pay | Admitting: Internal Medicine

## 2016-11-22 NOTE — Telephone Encounter (Signed)
Patient is returning phone call.  °

## 2016-11-22 NOTE — Telephone Encounter (Signed)
Katie- please see if we have a spot somewhere- 15 minutes

## 2016-11-22 NOTE — Telephone Encounter (Signed)
Please see if patient can come in on July 3,2018 at 11:30am;okay to double book. Thanks.

## 2016-11-22 NOTE — Telephone Encounter (Signed)
Spoke with pt. States that she has a rash all over her body. Reports wanting to see CY only for this. She has seen a dermatologist and didn't get any answers. Rash is located on her arms and legs. Pt has an upcoming appointment with CY on 12/20/16.  CY - please advise. Thanks.

## 2016-11-22 NOTE — Telephone Encounter (Signed)
lmtcb x1 for pt. 

## 2016-11-22 NOTE — Telephone Encounter (Signed)
Pt scheduled at below listed date/time.  Nothing further needed.

## 2016-11-23 ENCOUNTER — Other Ambulatory Visit (INDEPENDENT_AMBULATORY_CARE_PROVIDER_SITE_OTHER): Payer: Medicare Other

## 2016-11-23 ENCOUNTER — Ambulatory Visit (INDEPENDENT_AMBULATORY_CARE_PROVIDER_SITE_OTHER): Payer: Medicare Other | Admitting: Internal Medicine

## 2016-11-23 ENCOUNTER — Encounter: Payer: Self-pay | Admitting: Internal Medicine

## 2016-11-23 VITALS — BP 120/74 | HR 69 | Ht 70.0 in | Wt 205.8 lb

## 2016-11-23 DIAGNOSIS — I480 Paroxysmal atrial fibrillation: Secondary | ICD-10-CM | POA: Diagnosis not present

## 2016-11-23 DIAGNOSIS — L500 Allergic urticaria: Secondary | ICD-10-CM

## 2016-11-23 DIAGNOSIS — L509 Urticaria, unspecified: Secondary | ICD-10-CM

## 2016-11-23 LAB — CBC WITH DIFFERENTIAL/PLATELET
BASOS ABS: 0.1 10*3/uL (ref 0.0–0.1)
Basophils Relative: 1.1 % (ref 0.0–3.0)
EOS PCT: 3.3 % (ref 0.0–5.0)
Eosinophils Absolute: 0.3 10*3/uL (ref 0.0–0.7)
HCT: 45.2 % (ref 36.0–46.0)
Hemoglobin: 14.9 g/dL (ref 12.0–15.0)
LYMPHS ABS: 2 10*3/uL (ref 0.7–4.0)
Lymphocytes Relative: 23.1 % (ref 12.0–46.0)
MCHC: 32.9 g/dL (ref 30.0–36.0)
MCV: 89.4 fl (ref 78.0–100.0)
MONOS PCT: 8.3 % (ref 3.0–12.0)
Monocytes Absolute: 0.7 10*3/uL (ref 0.1–1.0)
NEUTROS ABS: 5.5 10*3/uL (ref 1.4–7.7)
NEUTROS PCT: 64.2 % (ref 43.0–77.0)
PLATELETS: 281 10*3/uL (ref 150.0–400.0)
RBC: 5.05 Mil/uL (ref 3.87–5.11)
RDW: 14.4 % (ref 11.5–15.5)
WBC: 8.5 10*3/uL (ref 4.0–10.5)

## 2016-11-23 NOTE — Progress Notes (Signed)
Patient ID: Laura Mendez, female    DOB: 1941/05/01, 76 y.o.   MRN: [id number]  HPI F former smoker followed for Severe asthma/ COPD FEV1 47%, Xolair ---------------------------------------------------------------  11/06/2015-76 year old female former smoker followed for severe asthma/COPD/Xolair FOLLOWS FOR: Still on Xolair injections and doing well. Financial support for Xolair finally got worked out again. She strongly believes this medication has controlled previously severe and complicated asthma component of her chronic lung disease. Last year tried increasing to 3 week interval and had exacerbation. Feels well, paces self for exertion. Incidental problem-rash-using triamcinolone 0.1% for nonspecific rash on arm  06/21/2016-76 year old female former smoker followed for severe asthma/COPD/Xolair, complicated by allergic rhinitis, paroxysmal A. Fib FOLLOWS FOR: Still on Xolair injections, doing well. Pt reports exercising 5 times a week at the gym.  Says she is "doing fine" with Xolair. Using rescue inhaler less than 1 time per week with some seasonal variation. Continues Advair 250. Going to [employer] exercise 5 days per week and out to church each Sunday. This is more time out of the house than she used to feel safe with. Little routine cough or phlegm and no sleep disturbance.  11/23/16- 76 year old female former smoker followed for severe asthma/COPD/Xolair, complicated by allergic rhinitis, paroxysmal A. Fib Pt has rash with redness, hives, and welps on arms, legs and back of neck and shoulders. Pt seen dermotologist regards to rash no releif seen 2 weeks ago. Rash been there over 23months. Pt states rash has worsen in last two weeks. Rash present 6 months on sun exposed areas. Cetirizine 3 weeks no help. Dermatology biopsy followed by steroid shot and steroid taper. This may have helped a little. A. fib treated with Xarelto and diltiazem. These were started after onset of the  rash.  Review of Systems-see HPI   + = pos  Constitutional:   No-   weight loss, night sweats, fevers, chills, fatigue, lassitude. HEENT:   No-  headaches, difficulty swallowing, tooth/dental problems, sore throat,       No-  sneezing, itching, ear ache,  nasal congestion, +post nasal drip,  CV:  No-   chest pain, orthopnea, PND, swelling in lower extremities, anasarca, dizziness, palpitations Resp:  +Some shortness of breath with exertion or at rest- unchanged             No-   productive cough,  No non-productive cough,  No-  coughing up of blood.              No-   change in color of mucus.  No- wheezing.   Skin:  +rash or lesions. GI:  No-   heartburn, indigestion, abdominal pain, nausea, vomiting, GU:  MS:  +   joint pain or swelling.   Neuro- nothing unusual  Psych:  No- change in mood or affect. No depression or anxiety.  No memory loss  Objective:     General- Alert, Oriented, Affect-appropriate, Distress- none acute, Pleasant, + Overweight Skin- + diffuse urticarial rash on sun exposed skin Lymphadenopathy- none Head- atraumatic            Eyes- Gross vision intact, PERRLA, conjunctivae clear secretions            Ears- Hearing, canals normal            Nose- Mild turbinate edema, No-Septal dev, mucus, polyps, erosion, perforation             Throat- Mallampati II , mucosa -not red, drainage- none, tonsils- atrophic Neck-  flexible , trachea midline, no stridor , thyroid nl, carotid no bruit Chest - symmetrical excursion , unlabored           Heart/CV- RRR , no murmur , no gallop  , no rub, nl s1 s2                           - JVD- none , edema- none, stasis changes- none, varices- none           Lung- +clear to P&A/ distant/unlabored, wheeze- none, cough-none, dullness-none, rub- none           Chest wall-  Abd-  Br/ Gen/ Rectal- Not done, not indicated Extrem- cyanosis- none, clubbing, none, atrophy- none, strength- nl Neuro- grossly intact to observation

## 2016-11-23 NOTE — Patient Instructions (Addendum)
We are going to stop Xolair injections for a month or so if we can, to see if the rash goes away.  Suggest you try taking a daily antihistamine like otc Clortrimeton to control itching  Order- lab- Allergy profile, CBC w diff, ANA, alpha-gal      Dx allergic urticaria  Please call as needed  Keep appointment

## 2016-11-25 LAB — RESPIRATORY ALLERGY PROFILE REGION II ~~LOC~~
ALLERGEN, D PTERNOYSSINUS, D1: 0.12 kU/L — AB
Allergen, C. Herbarum, M2: <0.1 kU/L
Allergen, Cedar tree, t12: <0.1 kU/L
Allergen, Comm Silver Birch, t9: <0.1 kU/L
Allergen, Cottonwood, t14: <0.1 kU/L
Allergen, Mouse Urine Protein, e78: <0.1 kU/L
Allergen, Oak,t7: <0.1 kU/L
Bermuda Grass: <0.1 kU/L
Box Elder IgE: <0.1 kU/L
Cat Dander: <0.1 kU/L
Cockroach: 0.24 kU/L — ABNORMAL HIGH
Dog Dander: <0.1 kU/L
Elm IgE: <0.1 kU/L
IgE (Immunoglobulin E), Serum: 409 kU/L — ABNORMAL HIGH (ref <115)
Sheep Sorrel IgE: <0.1 kU/L

## 2016-11-25 LAB — ANA: ANA: NEGATIVE

## 2016-11-26 ENCOUNTER — Ambulatory Visit: Payer: Medicare Other

## 2016-11-29 ENCOUNTER — Telehealth: Payer: Self-pay | Admitting: Internal Medicine

## 2016-11-29 LAB — GALACTOSE-ALPHA-1,3-GALACTOSE IGE: Galactose-alpha-1,3-galactose IgE: <0.1 kU/L (ref <0.35)

## 2016-11-29 NOTE — Telephone Encounter (Signed)
Called and spoke with the pt about her lab results. She is aware and had a few questions.    Pt stated that she still has the rash/whelps all over except her chest and back.  She has been using the antihistamine, benadryl cream and cortisone 10 cream and this has helped some for her.    She wanted to know if she can start back on the xolair again?  She stated that she is having some throat congestion and chest feels tighter than usual for her.  She wanted to get CY's recs. Please advise. Thanks  Allergies  Allergen Reactions  . Ibuprofen Hives  . Clarithromycin Other (See Comments)    "just about killed me"  . Codeine Phosphate     REACTION: nausea

## 2016-11-29 NOTE — Telephone Encounter (Signed)
This is too soon to be sure the Xolair isn't causing her rash. Can she possibly go another week?

## 2016-11-29 NOTE — Telephone Encounter (Signed)
Spoke with patient. She is aware of CY's recs. Stated that she will call back to give CY an update on her rash.

## 2016-11-30 ENCOUNTER — Other Ambulatory Visit (HOSPITAL_COMMUNITY): Payer: Self-pay | Admitting: Cardiology

## 2016-12-02 ENCOUNTER — Telehealth: Payer: Self-pay | Admitting: Internal Medicine

## 2016-12-02 NOTE — Telephone Encounter (Signed)
Called patient. Phone rang once and then went to VM. Left message for her to call back before 5pm today.

## 2016-12-03 NOTE — Telephone Encounter (Signed)
lmomtcb x 2 for the pt.  

## 2016-12-05 DIAGNOSIS — L509 Urticaria, unspecified: Secondary | ICD-10-CM | POA: Insufficient documentation

## 2016-12-05 NOTE — Assessment & Plan Note (Addendum)
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 I doubt Xolair, which she continues to think really helps her. She is willing to come off of it for a month or 2 to see what happens to her skin. Plan-labs for environmental allergy profile, alpha gal, stop Xolair for a month to see if that helps the rash, Chlor-Trimeton. Dermatologist if these don't help

## 2016-12-05 NOTE — Assessment & Plan Note (Signed)
Managed by cardiology 

## 2016-12-06 NOTE — Telephone Encounter (Signed)
lmtcb X3 for pt.  Will close message per triage protocol.  

## 2016-12-20 ENCOUNTER — Encounter: Payer: Self-pay | Admitting: Internal Medicine

## 2016-12-20 ENCOUNTER — Telehealth: Payer: Self-pay | Admitting: Physician Assistant

## 2016-12-20 ENCOUNTER — Ambulatory Visit (INDEPENDENT_AMBULATORY_CARE_PROVIDER_SITE_OTHER): Payer: Medicare Other | Admitting: Internal Medicine

## 2016-12-20 VITALS — BP 120/68 | HR 69 | Ht 70.0 in | Wt 206.8 lb

## 2016-12-20 DIAGNOSIS — R21 Rash and other nonspecific skin eruption: Secondary | ICD-10-CM | POA: Diagnosis not present

## 2016-12-20 DIAGNOSIS — I480 Paroxysmal atrial fibrillation: Secondary | ICD-10-CM | POA: Diagnosis not present

## 2016-12-20 DIAGNOSIS — J449 Chronic obstructive pulmonary disease, unspecified: Secondary | ICD-10-CM

## 2016-12-20 NOTE — Telephone Encounter (Signed)
Per pt was started on Diltiazem and Xarelto and has had reaction rash to body except chest  other meds have been ruled out.Will forward to [employer] pa  for  recommendations ./cy

## 2016-12-20 NOTE — Telephone Encounter (Signed)
New message     Pt is calling asking for a call back.   Pt c/o medication issue:  1. Name of Medication: xarelto 20 mg, diltiazem 120 mg  2. How are you currently taking this medication (dosage and times per day)? Once a day-pt says she takes at 930 in the morning.  3. Are you having a reaction (difficulty breathing--STAT)? Rash all over the body except the chest  4. What is your medication issue? Pt states she began getting a rash when she started the medication

## 2016-12-20 NOTE — Progress Notes (Signed)
Patient ID: Laura Mendez, female    DOB: 1941-03-17, 76 y.o.   MRN: [id number]  HPI F former smoker followed for Severe asthma/ COPD FEV1 81%, Xolair, complicated by allergic rhinitis,  paroxysmal A. Fib ---------------------------------------------------------------.  11/23/16- 76 year old female former smoker followed for severe asthma/COPD/Xolair, complicated by allergic rhinitis, paroxysmal A. Fib Pt has rash with redness, hives, and welps on arms, legs and back of neck and shoulders. Pt seen dermotologist regards to rash no releif seen 2 weeks ago. Rash been there over 31months. Pt states rash has worsen in last two weeks. Rash present 6 months on sun exposed areas. Cetirizine 3 weeks no help. Dermatology biopsy followed by steroid shot and steroid taper. This may have helped a little. A. fib treated with Xarelto and diltiazem. These were started after onset of the rash.  12/20/16- 76 year old female former smoker followed for severe asthma/COPD/Xolair, complicated by allergic rhinitis, paroxysmal A. Fib, rash FOLLOWS FOR: Pt continues to have rash on her body and states it started after starting Xarelto.  Has not been on Xolair either and starting to have SOB with wheezing again. Labs 11/23/16- allergy profile total IgE 409 on Xolair, elevated specific IgE for dust mite and cockroach. EOS 3,300, Nl ANA and alpha-gal Derm Path report reviewed- Spongiotic/ eczema process- does not describe it as urticaria. Chlor-Trimeton made her too drowsy. Using Benadryl ointment but still having pruritic rash especially legs and arms. She denies being outdoors much, reducing the chance this is a photosensitivity rash. No improvement off Xolair for 4 weeks. Without Xolair she feels tighter in chest and more short of breath.  Review of Systems-see HPI   + = pos  Constitutional:   No-   weight loss, night sweats, fevers, chills, fatigue, lassitude. HEENT:   No-  headaches, difficulty swallowing, tooth/dental  problems, sore throat,       No-  sneezing, itching, ear ache,  nasal congestion, +post nasal drip,  CV:  No-   chest pain, orthopnea, PND, swelling in lower extremities, anasarca, dizziness, palpitations Resp:  +Some shortness of breath with exertion or at rest- unchanged             No-   productive cough,  No non-productive cough,  No-  coughing up of blood.              No-   change in color of mucus.  No- wheezing.   Skin:  +rash or lesions. GI:  No-   heartburn, indigestion, abdominal pain, nausea, vomiting, GU:  MS:  +   joint pain or swelling.   Neuro- nothing unusual  Psych:  No- change in mood or affect. No depression or anxiety.  No memory loss  Objective:     General- Alert, Oriented, Affect-appropriate, Distress- none acute, Pleasant, + Overweight Skin- + Faint blotchy rash on arms and ankles Lymphadenopathy- none Head- atraumatic            Eyes- Gross vision intact, PERRLA, conjunctivae clear secretions            Ears- Hearing, canals normal            Nose- Mild turbinate edema, No-Septal dev, mucus, polyps, erosion, perforation             Throat- Mallampati II , mucosa -not red, drainage- none, tonsils- atrophic Neck- flexible , trachea midline, no stridor , thyroid nl, carotid no bruit Chest - symmetrical excursion , unlabored  Heart/CV- RRR +, no murmur , no gallop  , no rub, nl s1 s2                           - JVD- none , edema- none, stasis changes- none, varices- none           Lung- +clear to P&A/ distant/unlabored, wheeze- none, cough-none, dullness-none, rub- none           Chest wall-  Abd-  Br/ Gen/ Rectal- Not done, not indicated Extrem- cyanosis- none, clubbing, none, atrophy- none, strength- nl Neuro- grossly intact to observation

## 2016-12-20 NOTE — Patient Instructions (Addendum)
Ok to restart Xolair     Ask cardiology to change you from Xarelto to something else, since this rash seems to have started shortly after you began Xarelto, and has persisted despite appropriate therapy.

## 2016-12-21 MED ORDER — VERAPAMIL HCL ER 120 MG PO TBCR: 120.0000 mg | EXTENDED_RELEASE_TABLET | Freq: Every day | ORAL | 6 refills | Status: DC

## 2016-12-21 NOTE — Telephone Encounter (Signed)
It's possible that the rash is not from her medications. She had a rash that was being treated with prednisone when she was first diagnosed with afib and placed on Xarelto and dilt. I looked up both xarelto and diltiazem and it seems like dilt would be more likely to cause a rash. She can continue to monitor the rash with her PCP or we could try switching the dilt to something else. Verapamil SR 120mg  daily would be an acceptable alternative. We are trying to avoid BBs given her asthma

## 2016-12-21 NOTE — Telephone Encounter (Signed)
She feels either Xarelto or Diltiazem is cause of rash. Pt requesting to go ahead and change out Diltiazem for Verapamil SR 120 mg daily. Pt advised to call in about 30 days if she does not show improvement in rash after medication change is made and we can see about changing out Xarelto.

## 2016-12-22 ENCOUNTER — Telehealth: Payer: Self-pay | Admitting: Physician Assistant

## 2016-12-22 NOTE — Telephone Encounter (Signed)
Pt. Called due to the rash she have all over the body. Pt states the rash started after started Diltiazem 120 mg and Xarelto 20 mg medications on February 2018 the rash  is getting worse. Pt states she has been to an allergist, which he did a biopsy on the rash. Pt denies taken Prednisone for the rash. The allergist  recommended for her to call this office because the only two medications prescribed by her cardiologist may be causing the rash. Pt called yesterday the Lou Cal PA d/C the diltiazem and started her on Verapamil 120 mg. Pt states that one of the side effects of Verapamil is Rash. Pt is due to take the first dose tonight she is afraid to take it, because her rash is getting worse.

## 2016-12-22 NOTE — Telephone Encounter (Signed)
Left pt a message to call back. 

## 2016-12-22 NOTE — Telephone Encounter (Signed)
New message       Pt c/o medication issue:  1. Name of Medication: verapamil 2. How are you currently taking this medication (dosage and times per day)? 120mg  3. Are you having a reaction (difficulty breathing--STAT)? no  4. What is your medication issue?  While on diltiazem 120mg , pt got a rash from head to toe.  Verapamil was called in and the #1 side effect is a rash.  Pt did not start this medication because she already has a bad rash.  Please call

## 2016-12-23 ENCOUNTER — Ambulatory Visit (HOSPITAL_COMMUNITY)
Admission: RE | Admit: 2016-12-23 | Discharge: 2016-12-23 | Disposition: A | Discharge status: Home / Self Care | Payer: Medicare Other | Source: Ambulatory Visit | Attending: Nurse Practitioner | Admitting: Nurse Practitioner

## 2016-12-23 ENCOUNTER — Encounter (HOSPITAL_COMMUNITY): Payer: Self-pay | Admitting: Nurse Practitioner

## 2016-12-23 VITALS — BP 116/78 | HR 74 | Ht 70.0 in | Wt 209.6 lb

## 2016-12-23 DIAGNOSIS — R21 Rash and other nonspecific skin eruption: Secondary | ICD-10-CM

## 2016-12-23 DIAGNOSIS — J449 Chronic obstructive pulmonary disease, unspecified: Secondary | ICD-10-CM | POA: Insufficient documentation

## 2016-12-23 DIAGNOSIS — Z881 Allergy status to other antibiotic agents status: Secondary | ICD-10-CM | POA: Insufficient documentation

## 2016-12-23 DIAGNOSIS — Z886 Allergy status to analgesic agent status: Secondary | ICD-10-CM | POA: Insufficient documentation

## 2016-12-23 DIAGNOSIS — Z885 Allergy status to narcotic agent status: Secondary | ICD-10-CM | POA: Insufficient documentation

## 2016-12-23 DIAGNOSIS — Z7901 Long term (current) use of anticoagulants: Secondary | ICD-10-CM | POA: Insufficient documentation

## 2016-12-23 DIAGNOSIS — I483 Typical atrial flutter: Secondary | ICD-10-CM | POA: Insufficient documentation

## 2016-12-23 DIAGNOSIS — Z87891 Personal history of nicotine dependence: Secondary | ICD-10-CM | POA: Insufficient documentation

## 2016-12-23 NOTE — Telephone Encounter (Addendum)
Pt is aware of MD's recommendations to stop Verapamil because of rash and to see Roderic Palau at the A-Fib clinic for other rate or rhythm control suggestions. Pt has an appointment at the A-Fib clinic today at 2:45 PM with Roderic Palau. Pt is aware, she verbalized understanding.

## 2016-12-23 NOTE — Telephone Encounter (Signed)
Let's stop the verapamil because of rash. Stop diltiazem (should have already been stopped). Please have her see Roderic Palau, NP in the afib clinic for other rate control or rhythm control suggestions. Thanks Candee Furbish, MD

## 2016-12-23 NOTE — Telephone Encounter (Signed)
F/U call:  Patient calling, states that she is covered from "head to toe with a rash." Patient states that she "I just need help."

## 2016-12-23 NOTE — Patient Instructions (Signed)
Your physician has recommended you make the following change in your medication:  1)Stop verapamil

## 2016-12-23 NOTE — Telephone Encounter (Signed)
NA/no voicemail. In review of chart pt has had rash since before 11/06/15 and before Ca Channel Blockers.

## 2016-12-24 ENCOUNTER — Ambulatory Visit (INDEPENDENT_AMBULATORY_CARE_PROVIDER_SITE_OTHER): Payer: Medicare Other

## 2016-12-24 DIAGNOSIS — J4550 Severe persistent asthma, uncomplicated: Secondary | ICD-10-CM

## 2016-12-24 NOTE — Progress Notes (Signed)
Patient ID: Laura Mendez, female   DOB: 20-Aug-1940, 76 y.o.   MRN: [id number]     Primary Care Physician: Hoyt Koch, MD Referring Physician: ER f/u   Laura Mendez is a 76 y.o. female with a h/o new onset a flutter, 150 bpm, that was associated with left chest and left arm pain, dx in the ER, 7/20, with f/u in the afib clinic today. She was laying in bed reading a romance novel when her left sided CP began. Pt states that she had an intermittent cramping sensation to her chest that radiated to her left arm.  Pt notes that she was recently placed on prednisone taper and received steriod shot  for a rash of unknown etiology to her arms and trunk and she is mid way in her taper.  Pt reports that she has been evaluated by two dermatologists for her rash. Denies any new supplements or caffeine use. She states that she is having associated symptoms of palpitations. Pt notes that she has had a flutter sensation in the past.  She denies leg swelling, diaphoresis, nausea, and any other symptoms. Denies hx of thyroid issues. 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  She did convert on her own without cardioversion after receiving 5 mg lopressor and was given rx for metoprolol She reports that she has severe extrinsic asthma and was severly symptomatic for years until she started taking Xolair injections every two weeks. With this information, I am hesitate to for her to take  BB's, prescribed in the ER for rate control and will change to CCB. She can stop plavix and with a chadvasc score of 5, and discussion of choice of anticoagulants, opts for Xarelto daily.   She returns to afib clinic 8/28 for f/u echo. She has not had any further arrhythmia. Doing well on xarelto and can stop asa that she takes on her own. EF was normal and left atrium severly dilated at 55 mm. Severely calcified annulus. She has lost 8 lbs and saw PCP recently with  normal labs.  F/u in afib clinic 12/24/16. I have seen in over one year. She is followed by Dr.Skains. She is being seen for a rash that started shortly after I stopped seeing her. She recently had Cardizem stopped and started on verapamil yesterday for the rash. She woke up this am to more itching. Her pulmonologist stopped her lung injections x one month without improvement in symptoms. She feels it is the xarelto or CCB. She saw a dermatologist without improvement in symptoms or a clear diagnosis.   Today, she denies symptoms of palpitations, chest pain, shortness of breath, orthopnea, PND, lower extremity edema, dizziness, presyncope, syncope, or neurologic sequela. The patient is tolerating medications without difficulties and is otherwise without complaint today.   Past Medical History:  Diagnosis Date  . Chronic airway obstruction, not elsewhere classified   . Disorder of bone and cartilage, unspecified   . Routine general medical examination at a health care facility   . Unspecified asthma(493.90)    Past Surgical History:  Procedure Laterality Date  . ABDOMINAL HYSTERECTOMY      Current Outpatient Prescriptions  Medication Sig Dispense Refill  . diphenhydrAMINE (BENADRYL) 25 MG tablet Take 25 mg by mouth every 6 (six) hours as needed.    . fish oil-omega-3 fatty acids 1000 MG capsule Take 2 g by mouth daily.      Marland Kitchen  Fluticasone-Salmeterol (ADVAIR) 250-50 MCG/DOSE AEPB Inhale 1 puff into the lungs 2 (two) times daily.    . Multiple Vitamin (MULTIVITAMIN) tablet Take 1 tablet by mouth daily.     . VENTOLIN HFA 108 (90 Base) MCG/ACT inhaler TAKE 2 PUFFS EVERY 4 HOURS AS NEEDED (RESCUE) 18 Inhaler 3  . XARELTO 20 MG TABS tablet TAKE 1 TABLET BY MOUTH EVERY DAY WITH SUPPER 30 tablet 3   No current facility-administered medications for this encounter.     Allergies  Allergen Reactions  . Ibuprofen Hives  . Clarithromycin Other (See Comments)    "just about killed me"  . Codeine  Phosphate     REACTION: nausea  . Diltiazem Rash    ? Rash from diltiazem    Social History   Social History  . Marital status: Divorced    Spouse name: N/A  . Number of children: N/A  . Years of education: N/A   Occupational History  . retired    Social History Main Topics  . Smoking status: Former Smoker    Years: 0.00    Quit date: 05/24/1977  . Smokeless tobacco: Never Used  . Alcohol use No  . Drug use: No  . Sexual activity: Not on file   Other Topics Concern  . Not on file   Social History Narrative  . No narrative on file    Family History  Problem Relation Age of Onset  . COPD Father   . Cancer Mother   . Kidney cancer Sister   . Lung cancer Sister   . Ovarian cancer Sister   . Liver disease Sister   . Melanoma Sister     ROS- All systems are reviewed and negative except as per the HPI above  Physical Exam: Vitals:   12/23/16 1424  BP: 116/78  Pulse: 74  Weight: 209 lb 9.6 oz (95.1 kg)  Height: 5\' 10"  (1.778 m)    GEN- The patient is well appearing, alert and oriented x 3 today.   Head- normocephalic, atraumatic Eyes-  Sclera clear, conjunctiva pink Ears- hearing intact Oropharynx- clear Neck- supple, no JVP Lymph- no cervical lymphadenopathy Lungs- Clear to ausculation bilaterally, normal work of breathing Heart- Regular rate and rhythm, no murmurs, rubs or gallops, PMI not laterally displaced GI- soft, NT, ND, + BS Extremities- no clubbing, cyanosis, or edema, fine rash present on arms/lower legs. MS- no significant deformity or atrophy Skin- no rash or lesion Psych- euthymic mood, full affect Neuro- strength and sensation are intact  EKG- Sinus rhythm at 74 bpm   Epic records reviewed   Echo-Left ventricle: The cavity size was mildly dilated. Wall   thickness was increased in a pattern of mild LVH. Systolic   function was normal. The estimated ejection fraction was in the   range of 55% to 60%. Wall motion was normal; there were  no   regional wall motion abnormalities. Doppler parameters are   consistent with abnormal left ventricular relaxation (grade 1   diastolic dysfunction). Doppler parameters are consistent with   high ventricular filling pressure. - Mitral valve: Severely calcified annulus. There was mild   regurgitation. Valve area by continuity equation (using LVOT   flow): 2.36 cm^2. - Left atrium: The atrium was severely dilated. - Right ventricle: The cavity size was normal. Wall thickness was   normal. Systolic function was normal. - Right atrium: The atrium was mildly dilated. - Atrial septum: No defect or patent foramen ovale was identified   by color  flow Doppler. - Tricuspid valve: There was trivial regurgitation. - Pulmonary arteries: Systolic pressure was within the normal   range. PA peak pressure: 24 mm Hg (S).  Assessment and Plan: 1. Rash Questioning if CCB or xarelto Will stop verapamil to see first if CCB is to blame  2.Typical atrial flutter x one episode in 2017 Quiet   If flutter returns off CCB, may refer for flutter ablation  3. 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 Kenlea Woodell, [facility] [address] [place], [place] [address] [phone]

## 2016-12-28 ENCOUNTER — Telehealth: Payer: Self-pay | Admitting: Internal Medicine

## 2016-12-28 NOTE — Telephone Encounter (Signed)
#   vials:4 Ordered date:12/28/16 Shipping Date:12/28/16 

## 2016-12-29 DIAGNOSIS — L820 Inflamed seborrheic keratosis: Secondary | ICD-10-CM | POA: Diagnosis not present

## 2016-12-29 DIAGNOSIS — B078 Other viral warts: Secondary | ICD-10-CM | POA: Diagnosis not present

## 2016-12-29 DIAGNOSIS — L258 Unspecified contact dermatitis due to other agents: Secondary | ICD-10-CM | POA: Diagnosis not present

## 2016-12-29 NOTE — Telephone Encounter (Signed)
#   Vials:4 Arrival Date:12/29/16 Lot #:[id number] Exp Date:10/2019  

## 2016-12-30 ENCOUNTER — Encounter (HOSPITAL_COMMUNITY): Payer: Self-pay | Admitting: Nurse Practitioner

## 2016-12-30 ENCOUNTER — Ambulatory Visit (HOSPITAL_COMMUNITY)
Admission: RE | Admit: 2016-12-30 | Discharge: 2016-12-30 | Disposition: A | Discharge status: Home / Self Care | Payer: Medicare Other | Source: Ambulatory Visit | Attending: Nurse Practitioner | Admitting: Nurse Practitioner

## 2016-12-30 VITALS — BP 122/78 | HR 73 | Ht 70.0 in | Wt 209.0 lb

## 2016-12-30 DIAGNOSIS — Z801 Family history of malignant neoplasm of trachea, bronchus and lung: Secondary | ICD-10-CM | POA: Diagnosis not present

## 2016-12-30 DIAGNOSIS — Z87891 Personal history of nicotine dependence: Secondary | ICD-10-CM | POA: Diagnosis not present

## 2016-12-30 DIAGNOSIS — Z7901 Long term (current) use of anticoagulants: Secondary | ICD-10-CM | POA: Insufficient documentation

## 2016-12-30 DIAGNOSIS — I483 Typical atrial flutter: Secondary | ICD-10-CM | POA: Diagnosis not present

## 2016-12-30 DIAGNOSIS — Z8379 Family history of other diseases of the digestive system: Secondary | ICD-10-CM | POA: Insufficient documentation

## 2016-12-30 DIAGNOSIS — I4892 Unspecified atrial flutter: Secondary | ICD-10-CM | POA: Diagnosis present

## 2016-12-30 DIAGNOSIS — Z8051 Family history of malignant neoplasm of kidney: Secondary | ICD-10-CM | POA: Diagnosis not present

## 2016-12-30 DIAGNOSIS — Z885 Allergy status to narcotic agent status: Secondary | ICD-10-CM | POA: Diagnosis not present

## 2016-12-30 DIAGNOSIS — Z881 Allergy status to other antibiotic agents status: Secondary | ICD-10-CM | POA: Diagnosis not present

## 2016-12-30 DIAGNOSIS — Z808 Family history of malignant neoplasm of other organs or systems: Secondary | ICD-10-CM | POA: Insufficient documentation

## 2016-12-30 DIAGNOSIS — Z825 Family history of asthma and other chronic lower respiratory diseases: Secondary | ICD-10-CM | POA: Diagnosis not present

## 2016-12-30 DIAGNOSIS — Z7951 Long term (current) use of inhaled steroids: Secondary | ICD-10-CM | POA: Insufficient documentation

## 2016-12-30 DIAGNOSIS — J449 Chronic obstructive pulmonary disease, unspecified: Secondary | ICD-10-CM | POA: Diagnosis not present

## 2016-12-30 DIAGNOSIS — Z8041 Family history of malignant neoplasm of ovary: Secondary | ICD-10-CM | POA: Diagnosis not present

## 2016-12-30 DIAGNOSIS — R21 Rash and other nonspecific skin eruption: Secondary | ICD-10-CM

## 2016-12-30 DIAGNOSIS — Z888 Allergy status to other drugs, medicaments and biological substances status: Secondary | ICD-10-CM | POA: Diagnosis not present

## 2016-12-30 DIAGNOSIS — Z86718 Personal history of other venous thrombosis and embolism: Secondary | ICD-10-CM | POA: Diagnosis not present

## 2016-12-30 MED ORDER — OMALIZUMAB 150 MG ~~LOC~~ SOLR
300.0000 mg | Freq: Once | SUBCUTANEOUS | Status: AC
  Administered 2016-12-24: 300 mg via SUBCUTANEOUS

## 2016-12-30 NOTE — Progress Notes (Signed)
Patient ID: Laura Mendez, female   DOB: 1940/12/01, 76 y.o.   MRN: [id number]     Primary Care Physician: Laura Koch, MD Referring Physician: ER f/u   Laura Mendez is a 76 y.o. female with a h/o new onset a flutter, 150 bpm, that was associated with left chest and left arm pain, dx in the ER, 7/20, with f/u in the afib clinic today. She was laying in bed reading a romance novel when her left sided CP began. Pt states that she had an intermittent cramping sensation to her chest that radiated to her left arm.  Pt notes that she was recently placed on prednisone taper and received steriod shot  for a rash of unknown etiology to her arms and trunk and she is mid way in her taper.  Pt reports that she has been evaluated by two dermatologists for her rash. Denies any new supplements or caffeine use. She states that she is having associated symptoms of palpitations. Pt notes that she has had a flutter sensation in the past.  She denies leg swelling, diaphoresis, nausea, and any other symptoms. Denies hx of thyroid issues. 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  She did convert on her own without cardioversion after receiving 5 mg lopressor and was given rx for metoprolol She reports that she has severe extrinsic asthma and was severly symptomatic for years until she started taking Xolair injections every two weeks. With this information, I am hesitate to for her to take  BB's, prescribed in the ER for rate control and will change to CCB. She can stop plavix and with a chadvasc score of 5, and discussion of choice of anticoagulants, opts for Xarelto daily.   She returns to afib clinic 8/28 for f/u echo. She has not had any further arrhythmia. Doing well on xarelto and can stop asa that she takes on her own. EF was normal and left atrium severly dilated at 55 mm. Severely calcified annulus. She has lost 8 lbs and saw PCP recently with  normal labs.  F/u in afib clinic 12/24/16. I have seen in over one year. She is followed by Dr.Skains. She is being seen for a rash that started shortly after I stopped seeing her. She recently had Cardizem stopped and started on verapamil yesterday for the rash. She woke up this am to more itching. Her pulmonologist stopped her lung injections x one month without improvement in symptoms. She feels it is the xarelto or CCB. She saw a dermatologist without improvement in symptoms or a clear diagnosis.   F/u 8/9,She has been off cardizem/verapail  x one week and rash is improving.She is starting to feel  Relief. She had f/u with dermatologist yesterday.  Today, she denies symptoms of palpitations, chest pain, shortness of breath, orthopnea, PND, lower extremity edema, dizziness, presyncope, syncope, or neurologic sequela. The patient is tolerating medications without difficulties and is otherwise without complaint today.   Past Medical History:  Diagnosis Date  . Chronic airway obstruction, not elsewhere classified   . Disorder of bone and cartilage, unspecified   . Routine general medical examination at a health care facility   . Unspecified asthma(493.90)    Past Surgical History:  Procedure Laterality Date  . ABDOMINAL HYSTERECTOMY      Current Outpatient Prescriptions  Medication Sig Dispense Refill  . fish oil-omega-3 fatty acids 1000 MG capsule Take 2 g by  mouth daily.      . Fluticasone-Salmeterol (ADVAIR) 250-50 MCG/DOSE AEPB Inhale 1 puff into the lungs 2 (two) times daily.    . mometasone (ELOCON) 0.1 % cream Apply 1 application topically daily.    . Multiple Vitamin (MULTIVITAMIN) tablet Take 1 tablet by mouth daily.     . VENTOLIN HFA 108 (90 Base) MCG/ACT inhaler TAKE 2 PUFFS EVERY 4 HOURS AS NEEDED (RESCUE) 18 Inhaler 3  . XARELTO 20 MG TABS tablet TAKE 1 TABLET BY MOUTH EVERY DAY WITH SUPPER 30 tablet 3   No current facility-administered medications for this encounter.      Allergies  Allergen Reactions  . Ibuprofen Hives  . Clarithromycin Other (See Comments)    "just about killed me"  . Codeine Phosphate     REACTION: nausea  . Diltiazem Rash    ? Rash from diltiazem    Social History   Social History  . Marital status: Divorced    Spouse name: N/A  . Number of children: N/A  . Years of education: N/A   Occupational History  . retired    Social History Main Topics  . Smoking status: Former Smoker    Years: 0.00    Quit date: 05/24/1977  . Smokeless tobacco: Never Used  . Alcohol use No  . Drug use: No  . Sexual activity: Not on file   Other Topics Concern  . Not on file   Social History Narrative  . No narrative on file    Family History  Problem Relation Age of Onset  . COPD Father   . Cancer Mother   . Kidney cancer Sister   . Lung cancer Sister   . Ovarian cancer Sister   . Liver disease Sister   . Melanoma Sister     ROS- All systems are reviewed and negative except as per the HPI above  Physical Exam: Vitals:   12/30/16 0900  BP: 122/78  Pulse: 73  Weight: 209 lb (94.8 kg)  Height: 5\' 10"  (1.778 m)    GEN- The patient is well appearing, alert and oriented x 3 today.   Head- normocephalic, atraumatic Eyes-  Sclera clear, conjunctiva pink Ears- hearing intact Oropharynx- clear Neck- supple, no JVP Lymph- no cervical lymphadenopathy Lungs- Clear to ausculation bilaterally, normal work of breathing Heart- Regular rate and rhythm, no murmurs, rubs or gallops, PMI not laterally displaced GI- soft, NT, ND, + BS Extremities- no clubbing, cyanosis, or edema, fine rash present on arms/lower legs. MS- no significant deformity or atrophy Skin- no rash or lesion Psych- euthymic mood, full affect Neuro- strength and sensation are intact  EKG- NSR at 73 bpm, pr itn 132 ms, qrs int 86 ms, qtc 423 ms  Epic records reviewed    Assessment and Plan: 1. Rash Improving off CCB For now will not have her on any  rate control as she had only one episode of atrial flutter associated with prednisone use BB's may aggravate lung status  2.Typical atrial flutter x one episode in 2017 Quiet   If typical atrial flutter returns off CCB, may refer for flutter ablation  3. 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 Carroll, [facility] [address] [place], [place] [address] [phone]

## 2017-01-02 NOTE — Assessment & Plan Note (Signed)
I think we have ruled out Xolair as potential cause of her persistent rash. I'm concerned that one of her cardiac meds may be the problem. She thinks the rash began when she began Xarelto. Plan-she is going to discuss cardiac meds as possible cause of rash, with cardiology

## 2017-01-02 NOTE — Assessment & Plan Note (Signed)
She has continued to feel that Xolair has stabilized her respiratory problems. She has not had a significant exacerbation in a long time. Rash did not improve and breathing got worse when she was off Xolair for a month. Plan-okay to restart Xolair

## 2017-01-02 NOTE — Assessment & Plan Note (Signed)
We have ruled out Xolair. Timing suggests Xarelto as possible cause of her rash. Plan-she is going to discuss alternatives to Xarelto with cardiology.

## 2017-01-07 ENCOUNTER — Ambulatory Visit (INDEPENDENT_AMBULATORY_CARE_PROVIDER_SITE_OTHER): Payer: Medicare Other

## 2017-01-07 DIAGNOSIS — J4540 Moderate persistent asthma, uncomplicated: Secondary | ICD-10-CM

## 2017-01-07 MED ORDER — OMALIZUMAB 150 MG ~~LOC~~ SOLR
300.0000 mg | SUBCUTANEOUS | Status: DC
  Administered 2017-01-07: 300 mg via SUBCUTANEOUS

## 2017-01-07 NOTE — Progress Notes (Signed)
Documentation of medication administration and charges of Xolair have been completed by Carnisha Feltz, CMA based on the Xolair documentation sheet completed by Tammy Scott.  

## 2017-01-13 ENCOUNTER — Encounter: Payer: Medicare Other | Admitting: Internal Medicine

## 2017-01-13 DIAGNOSIS — M7541 Impingement syndrome of right shoulder: Secondary | ICD-10-CM | POA: Diagnosis not present

## 2017-01-21 ENCOUNTER — Ambulatory Visit (INDEPENDENT_AMBULATORY_CARE_PROVIDER_SITE_OTHER): Payer: Medicare Other

## 2017-01-21 DIAGNOSIS — J4540 Moderate persistent asthma, uncomplicated: Secondary | ICD-10-CM

## 2017-01-25 MED ORDER — OMALIZUMAB 150 MG ~~LOC~~ SOLR
300.0000 mg | SUBCUTANEOUS | Status: DC
  Administered 2017-01-21: 300 mg via SUBCUTANEOUS

## 2017-01-25 NOTE — Progress Notes (Signed)
Documentation of medication administration and charges of Xolair have been completed by Lindsay Lemons, CMA based on the Xolair documentation sheet completed by Tammy Scott.  

## 2017-01-31 ENCOUNTER — Telehealth: Payer: Self-pay | Admitting: Internal Medicine

## 2017-01-31 NOTE — Telephone Encounter (Signed)
#   vials:4 Ordered date:01/31/17 Shipping Date:01/31/17 

## 2017-02-01 ENCOUNTER — Telehealth: Payer: Self-pay | Admitting: *Deleted

## 2017-02-01 NOTE — Telephone Encounter (Signed)
The first time I called the pt. I got her voice mail. I tried her at home the 2nd time, I was leaving her a message and she picked up. I cancelled her appt. For Fri. And rsc her for Mon. 02/07/17 at 10:15. Nothing further needed.

## 2017-02-01 NOTE — Telephone Encounter (Signed)
#   Vials:4 Arrival Date:02/01/17 Lot #:[id number] Exp Date:06/2020  

## 2017-02-04 ENCOUNTER — Ambulatory Visit: Payer: Medicare Other

## 2017-02-07 ENCOUNTER — Ambulatory Visit: Payer: Medicare Other

## 2017-02-08 ENCOUNTER — Encounter: Payer: Self-pay | Admitting: Internal Medicine

## 2017-02-09 ENCOUNTER — Ambulatory Visit (INDEPENDENT_AMBULATORY_CARE_PROVIDER_SITE_OTHER): Payer: Medicare Other

## 2017-02-09 DIAGNOSIS — J4540 Moderate persistent asthma, uncomplicated: Secondary | ICD-10-CM | POA: Diagnosis not present

## 2017-02-10 ENCOUNTER — Telehealth: Payer: Self-pay | Admitting: Internal Medicine

## 2017-02-10 MED ORDER — OMALIZUMAB 150 MG ~~LOC~~ SOLR
300.0000 mg | SUBCUTANEOUS | Status: DC
  Administered 2017-02-09: 300 mg via SUBCUTANEOUS

## 2017-02-10 NOTE — Telephone Encounter (Signed)
ATC pt, no answer. Left message for pt to call back.  

## 2017-02-10 NOTE — Progress Notes (Signed)
Documentation of medication administration and charges of Xolair have been completed by Lindsay Lemons, CMA based on the Xolair documentation sheet completed by Ashley Caulfield, RMA. 

## 2017-02-11 NOTE — Telephone Encounter (Signed)
lmomtcb x 2 for the pt.  

## 2017-02-14 NOTE — Telephone Encounter (Signed)
lmtcb for pt.  

## 2017-02-15 NOTE — Telephone Encounter (Signed)
lmomtcb x 4 for the pt.  Due to protocol will sign off of this message at this time.

## 2017-02-16 ENCOUNTER — Ambulatory Visit (INDEPENDENT_AMBULATORY_CARE_PROVIDER_SITE_OTHER): Payer: Medicare Other | Admitting: Internal Medicine

## 2017-02-16 ENCOUNTER — Encounter: Payer: Self-pay | Admitting: Internal Medicine

## 2017-02-16 ENCOUNTER — Other Ambulatory Visit (INDEPENDENT_AMBULATORY_CARE_PROVIDER_SITE_OTHER): Payer: Medicare Other

## 2017-02-16 VITALS — BP 110/72 | HR 72 | Temp 98.5°F | Ht 70.0 in | Wt 208.0 lb

## 2017-02-16 DIAGNOSIS — R7301 Impaired fasting glucose: Secondary | ICD-10-CM

## 2017-02-16 DIAGNOSIS — Z0000 Encounter for general adult medical examination without abnormal findings: Secondary | ICD-10-CM

## 2017-02-16 DIAGNOSIS — R21 Rash and other nonspecific skin eruption: Secondary | ICD-10-CM

## 2017-02-16 DIAGNOSIS — J449 Chronic obstructive pulmonary disease, unspecified: Secondary | ICD-10-CM

## 2017-02-16 DIAGNOSIS — Z23 Encounter for immunization: Secondary | ICD-10-CM

## 2017-02-16 DIAGNOSIS — Z0001 Encounter for general adult medical examination with abnormal findings: Secondary | ICD-10-CM

## 2017-02-16 LAB — CBC
HEMATOCRIT: 44.5 % (ref 36.0–46.0)
HEMOGLOBIN: 14.7 g/dL (ref 12.0–15.0)
MCHC: 32.9 g/dL (ref 30.0–36.0)
MCV: 92.2 fl (ref 78.0–100.0)
PLATELETS: 268 10*3/uL (ref 150.0–400.0)
RBC: 4.83 Mil/uL (ref 3.87–5.11)
RDW: 14.9 % (ref 11.5–15.5)
WBC: 6.7 10*3/uL (ref 4.0–10.5)

## 2017-02-16 LAB — TSH: TSH: 2.6 u[IU]/mL (ref 0.35–4.50)

## 2017-02-16 LAB — COMPREHENSIVE METABOLIC PANEL
ALT: 24 U/L (ref 0–35)
AST: 27 U/L (ref 0–37)
Albumin: 4.3 g/dL (ref 3.5–5.2)
Alkaline Phosphatase: 57 U/L (ref 39–117)
BUN: 16 mg/dL (ref 6–23)
CALCIUM: 10.2 mg/dL (ref 8.4–10.5)
CHLORIDE: 104 meq/L (ref 96–112)
CO2: 30 meq/L (ref 19–32)
Creatinine, Ser: 1.02 mg/dL (ref 0.40–1.20)
GFR: 55.94 mL/min — AB (ref >60.00)
GLUCOSE: 103 mg/dL — AB (ref 70–99)
Potassium: 4.3 mEq/L (ref 3.5–5.1)
Sodium: 143 mEq/L (ref 135–145)
Total Bilirubin: 0.6 mg/dL (ref 0.2–1.2)
Total Protein: 7.5 g/dL (ref 6.0–8.3)

## 2017-02-16 LAB — HEMOGLOBIN A1C: HEMOGLOBIN A1C: 5.6 % (ref 4.6–6.5)

## 2017-02-16 LAB — T4, FREE: Free T4: 1.1 ng/dL (ref 0.60–1.60)

## 2017-02-16 LAB — VITAMIN D 25 HYDROXY (VIT D DEFICIENCY, FRACTURES): VITD: 38.19 ng/mL (ref 30.00–100.00)

## 2017-02-16 MED ORDER — ZOSTER VAC RECOMB ADJUVANTED 50 MCG/0.5ML IM SUSR: 0.5000 mL | Freq: Once | INTRAMUSCULAR | 1 refills | Status: AC

## 2017-02-16 NOTE — Assessment & Plan Note (Addendum)
Seeing pulmonary and on xolair which is helpful. No flare today. Uses advair and albuterol as well.

## 2017-02-16 NOTE — Progress Notes (Signed)
   Subjective:    Patient ID: Laura Mendez, female    DOB: 10/26/1940, 76 y.o.   MRN: [id number]  HPI Here for medicare wellness and physical, no new complaints. Please see A/P for status and treatment of chronic medical problems.   Diet: heart healthy Physical activity: sedentary Depression/mood screen: negative Hearing: intact to whispered voice Visual acuity: grossly normal, performs annual eye exam  ADLs: capable Fall risk: none Home safety: good Cognitive evaluation: intact to orientation, naming, recall and repetition EOL planning: adv directives discussed  I have personally reviewed and have noted 1. The patient's medical and social history - reviewed today no changes 2. Their use of alcohol, tobacco or illicit drugs 3. Their current medications and supplements 4. The patient's functional ability including ADL's, fall risks, home safety risks and hearing or visual impairment. 5. Diet and physical activities 6. Evidence for depression or mood disorders 7. Care team reviewed and updated (available in snapshot)  Review of Systems  Constitutional: Negative.   HENT: Negative.   Eyes: Negative.   Respiratory: Negative for cough, chest tightness and shortness of breath.   Cardiovascular: Negative for chest pain, palpitations and leg swelling.  Gastrointestinal: Negative for abdominal distention, abdominal pain, constipation, diarrhea, nausea and vomiting.  Musculoskeletal: Negative.   Skin: Positive for rash.  Neurological: Negative.   Psychiatric/Behavioral: Negative.       Objective:   Physical Exam  Constitutional: She is oriented to person, place, and time. She appears well-developed and well-nourished.  HENT:  Head: Normocephalic and atraumatic.  Eyes: EOM are normal.  Neck: Normal range of motion.  Cardiovascular: Normal rate and regular rhythm.   Pulmonary/Chest: Effort normal and breath sounds normal. No respiratory distress. She has no wheezes. She has no  rales.  Abdominal: Soft. Bowel sounds are normal. She exhibits no distension. There is no tenderness. There is no rebound.  Musculoskeletal: She exhibits no edema.  Neurological: She is alert and oriented to person, place, and time. Coordination normal.  Skin: Skin is warm and dry.  Psychiatric: She has a normal mood and affect.   Vitals:   02/16/17 0813  BP: 110/72  Pulse: 72  Temp: 98.5 F (36.9 C)  TempSrc: Oral  SpO2: 98%  Weight: 208 lb (94.3 kg)  Height: 5\' 10"  (1.778 m)      Assessment & Plan:  Pneumonia 23 given at visit

## 2017-02-16 NOTE — Patient Instructions (Signed)
We are checking the blood work today and have given you the pneumonia shot.   Think about stopping the xarelto for 1-2 weeks to see if this is causing the rash. If it is we can change you to eliquis instead which is similar.   Health Maintenance, Female Adopting a healthy lifestyle and getting preventive care can go a long way to promote health and wellness. Talk with your health care provider about what schedule of regular examinations is right for you. This is a good chance for you to check in with your provider about disease prevention and staying healthy. In between checkups, there are plenty of things you can do on your own. Experts have done a lot of research about which lifestyle changes and preventive measures are most likely to keep you healthy. Ask your health care provider for more information. Weight and diet Eat a healthy diet  Be sure to include plenty of vegetables, fruits, low-fat dairy products, and lean protein.  Do not eat a lot of foods high in solid fats, added sugars, or salt.  Get regular exercise. This is one of the most important things you can do for your health. ? Most adults should exercise for at least 150 minutes each week. The exercise should increase your heart rate and make you sweat (moderate-intensity exercise). ? Most adults should also do strengthening exercises at least twice a week. This is in addition to the moderate-intensity exercise.  Maintain a healthy weight  Body mass index (BMI) is a measurement that can be used to identify possible weight problems. It estimates body fat based on height and weight. Your health care provider can help determine your BMI and help you achieve or maintain a healthy weight.  For females 20 years of age and older: ? A BMI below 18.5 is considered underweight. ? A BMI of 18.5 to 24.9 is normal. ? A BMI of 25 to 29.9 is considered overweight. ? A BMI of 30 and above is considered obese.  Watch levels of cholesterol  and blood lipids  You should start having your blood tested for lipids and cholesterol at 76 years of age, then have this test every 5 years.  You may need to have your cholesterol levels checked more often if: ? Your lipid or cholesterol levels are high. ? You are older than 76 years of age. ? You are at high risk for heart disease.  Cancer screening Lung Cancer  Lung cancer screening is recommended for adults 55-80 years old who are at high risk for lung cancer because of a history of smoking.  A yearly low-dose CT scan of the lungs is recommended for people who: ? Currently smoke. ? Have quit within the past 15 years. ? Have at least a 30-pack-year history of smoking. A pack year is smoking an average of one pack of cigarettes a day for 1 year.  Yearly screening should continue until it has been 15 years since you quit.  Yearly screening should stop if you develop a health problem that would prevent you from having lung cancer treatment.  Breast Cancer  Practice breast self-awareness. This means understanding how your breasts normally appear and feel.  It also means doing regular breast self-exams. Let your health care provider know about any changes, no matter how small.  If you are in your 20s or 30s, you should have a clinical breast exam (CBE) by a health care provider every 1-3 years as part of a regular health exam.    If you are 40 or older, have a CBE every year. Also consider having a breast X-ray (mammogram) every year.  If you have a family history of breast cancer, talk to your health care provider about genetic screening.  If you are at high risk for breast cancer, talk to your health care provider about having an MRI and a mammogram every year.  Breast cancer gene (BRCA) assessment is recommended for women who have family members with BRCA-related cancers. BRCA-related cancers include: ? Breast. ? Ovarian. ? Tubal. ? Peritoneal cancers.  Results of the  assessment will determine the need for genetic counseling and BRCA1 and BRCA2 testing.  Cervical Cancer Your health care provider may recommend that you be screened regularly for cancer of the pelvic organs (ovaries, uterus, and vagina). This screening involves a pelvic examination, including checking for microscopic changes to the surface of your cervix (Pap test). You may be encouraged to have this screening done every 3 years, beginning at age 71.  For women ages 48-65, health care providers may recommend pelvic exams and Pap testing every 3 years, or they may recommend the Pap and pelvic exam, combined with testing for human papilloma virus (HPV), every 5 years. Some types of HPV increase your risk of cervical cancer. Testing for HPV may also be done on women of any age with unclear Pap test results.  Other health care providers may not recommend any screening for nonpregnant women who are considered low risk for pelvic cancer and who do not have symptoms. Ask your health care provider if a screening pelvic exam is right for you.  If you have had past treatment for cervical cancer or a condition that could lead to cancer, you need Pap tests and screening for cancer for at least 20 years after your treatment. If Pap tests have been discontinued, your risk factors (such as having a new sexual partner) need to be reassessed to determine if screening should resume. Some women have medical problems that increase the chance of getting cervical cancer. In these cases, your health care provider may recommend more frequent screening and Pap tests.  Colorectal Cancer  This type of cancer can be detected and often prevented.  Routine colorectal cancer screening usually begins at 76 years of age and continues through 76 years of age.  Your health care provider may recommend screening at an earlier age if you have risk factors for colon cancer.  Your health care provider may also recommend using home test  kits to check for hidden blood in the stool.  A small camera at the end of a tube can be used to examine your colon directly (sigmoidoscopy or colonoscopy). This is done to check for the earliest forms of colorectal cancer.  Routine screening usually begins at age 37.  Direct examination of the colon should be repeated every 5-10 years through 76 years of age. However, you may need to be screened more often if early forms of precancerous polyps or small growths are found.  Skin Cancer  Check your skin from head to toe regularly.  Tell your health care provider about any new moles or changes in moles, especially if there is a change in a mole's shape or color.  Also tell your health care provider if you have a mole that is larger than the size of a pencil eraser.  Always use sunscreen. Apply sunscreen liberally and repeatedly throughout the day.  Protect yourself by wearing long sleeves, pants, a wide-brimmed hat, and  sunglasses whenever you are outside.  Heart disease, diabetes, and high blood pressure  High blood pressure causes heart disease and increases the risk of stroke. High blood pressure is more likely to develop in: ? People who have blood pressure in the high end of the normal range (130-139/85-89 mm Hg). ? People who are overweight or obese. ? People who are African American.  If you are 18-39 years of age, have your blood pressure checked every 3-5 years. If you are 40 years of age or older, have your blood pressure checked every year. You should have your blood pressure measured twice-once when you are at a hospital or clinic, and once when you are not at a hospital or clinic. Record the average of the two measurements. To check your blood pressure when you are not at a hospital or clinic, you can use: ? An automated blood pressure machine at a pharmacy. ? A home blood pressure monitor.  If you are between 55 years and 79 years old, ask your health care provider if you  should take aspirin to prevent strokes.  Have regular diabetes screenings. This involves taking a blood sample to check your fasting blood sugar level. ? If you are at a normal weight and have a low risk for diabetes, have this test once every three years after 76 years of age. ? If you are overweight and have a high risk for diabetes, consider being tested at a younger age or more often. Preventing infection Hepatitis B  If you have a higher risk for hepatitis B, you should be screened for this virus. You are considered at high risk for hepatitis B if: ? You were born in a country where hepatitis B is common. Ask your health care provider which countries are considered high risk. ? Your parents were born in a high-risk country, and you have not been immunized against hepatitis B (hepatitis B vaccine). ? You have HIV or AIDS. ? You use needles to inject street drugs. ? You live with someone who has hepatitis B. ? You have had sex with someone who has hepatitis B. ? You get hemodialysis treatment. ? You take certain medicines for conditions, including cancer, organ transplantation, and autoimmune conditions.  Hepatitis C  Blood testing is recommended for: ? Everyone born from 1945 through 1965. ? Anyone with known risk factors for hepatitis C.  Sexually transmitted infections (STIs)  You should be screened for sexually transmitted infections (STIs) including gonorrhea and chlamydia if: ? You are sexually active and are younger than 76 years of age. ? You are older than 76 years of age and your health care provider tells you that you are at risk for this type of infection. ? Your sexual activity has changed since you were last screened and you are at an increased risk for chlamydia or gonorrhea. Ask your health care provider if you are at risk.  If you do not have HIV, but are at risk, it may be recommended that you take a prescription medicine daily to prevent HIV infection. This is  called pre-exposure prophylaxis (PrEP). You are considered at risk if: ? You are sexually active and do not regularly use condoms or know the HIV status of your partner(s). ? You take drugs by injection. ? You are sexually active with a partner who has HIV.  Talk with your health care provider about whether you are at high risk of being infected with HIV. If you choose to begin PrEP, you   should first be tested for HIV. You should then be tested every 3 months for as long as you are taking PrEP. Pregnancy  If you are premenopausal and you may become pregnant, ask your health care provider about preconception counseling.  If you may become pregnant, take 400 to 800 micrograms (mcg) of folic acid every day.  If you want to prevent pregnancy, talk to your health care provider about birth control (contraception). Osteoporosis and menopause  Osteoporosis is a disease in which the bones lose minerals and strength with aging. This can result in serious bone fractures. Your risk for osteoporosis can be identified using a bone density scan.  If you are 65 years of age or older, or if you are at risk for osteoporosis and fractures, ask your health care provider if you should be screened.  Ask your health care provider whether you should take a calcium or vitamin D supplement to lower your risk for osteoporosis.  Menopause may have certain physical symptoms and risks.  Hormone replacement therapy may reduce some of these symptoms and risks. Talk to your health care provider about whether hormone replacement therapy is right for you. Follow these instructions at home:  Schedule regular health, dental, and eye exams.  Stay current with your immunizations.  Do not use any tobacco products including cigarettes, chewing tobacco, or electronic cigarettes.  If you are pregnant, do not drink alcohol.  If you are breastfeeding, limit how much and how often you drink alcohol.  Limit alcohol intake to  no more than 1 drink per day for nonpregnant women. One drink equals 12 ounces of beer, 5 ounces of wine, or 1 ounces of hard liquor.  Do not use street drugs.  Do not share needles.  Ask your health care provider for help if you need support or information about quitting drugs.  Tell your health care provider if you often feel depressed.  Tell your health care provider if you have ever been abused or do not feel safe at home. This information is not intended to replace advice given to you by your health care provider. Make sure you discuss any questions you have with your health care provider. Document Released: 11/23/2010 Document Revised: 10/16/2015 Document Reviewed: 02/11/2015 [employer]vier Interactive Patient Education  2018 [employer]  

## 2017-02-16 NOTE — Assessment & Plan Note (Signed)
Could be related to xarelto. Have advised her of 7% risk stroke per year and okay to hold for 1-2 weeks to see if rash improves. If improvement switch to eliquis. She would like to discuss with cardiology which is fine. Checking labs today to rule out metabolic etiology since we do not have records of what dermatology has checked.

## 2017-02-16 NOTE — Assessment & Plan Note (Signed)
Wants to wait several weeks for flu shot. Given pneumonia 23 today (needs q 5 years with her asthma). Shingrix rx given at visit. Counseled about sun safety and mole surveillance. Given 10 year screening recommendations.

## 2017-02-21 ENCOUNTER — Ambulatory Visit: Payer: Medicare Other

## 2017-02-25 ENCOUNTER — Ambulatory Visit (INDEPENDENT_AMBULATORY_CARE_PROVIDER_SITE_OTHER): Payer: Medicare Other

## 2017-02-25 DIAGNOSIS — J4540 Moderate persistent asthma, uncomplicated: Secondary | ICD-10-CM

## 2017-02-28 ENCOUNTER — Telehealth: Payer: Self-pay | Admitting: *Deleted

## 2017-02-28 ENCOUNTER — Telehealth: Payer: Self-pay | Admitting: Internal Medicine

## 2017-02-28 NOTE — Telephone Encounter (Signed)
ATC patient. Call went straight to VM. Left a message asking if she needs proof of TB or TDAP. There is no documentation of a TB test or lab draw in her chart.

## 2017-02-28 NOTE — Telephone Encounter (Signed)
#   vials:4 Ordered date:02/28/17 Shipping Date:02/28/17

## 2017-03-01 NOTE — Telephone Encounter (Signed)
lmomtcb x 2 for the pt.  

## 2017-03-01 NOTE — Telephone Encounter (Signed)
#   Vials:4 Arrival Date:03/01/17 Lot #:[id number] OZYY:08/8248

## 2017-03-02 NOTE — Telephone Encounter (Signed)
ATC pt, no answer. Left message for pt to call back.  

## 2017-03-03 MED ORDER — OMALIZUMAB 150 MG ~~LOC~~ SOLR
300.0000 mg | SUBCUTANEOUS | Status: DC
  Administered 2017-02-25: 300 mg via SUBCUTANEOUS

## 2017-03-03 NOTE — Telephone Encounter (Signed)
LMOM TCB x3 Per office protocol will sign off- did leave detailed message asking pt to please call the office when she can so that message can be reopened and we can get her taken care of.  Will sign off

## 2017-03-03 NOTE — Progress Notes (Signed)
Documentation of medication administration and charges of Xolair have been completed by Madysyn Hanken, CMA based on the Xolair documentation sheet completed by Tammy Scott.  

## 2017-03-04 ENCOUNTER — Telehealth: Payer: Self-pay | Admitting: Internal Medicine

## 2017-03-04 ENCOUNTER — Ambulatory Visit (INDEPENDENT_AMBULATORY_CARE_PROVIDER_SITE_OTHER): Payer: Medicare Other

## 2017-03-04 DIAGNOSIS — Z23 Encounter for immunization: Secondary | ICD-10-CM

## 2017-03-04 NOTE — Telephone Encounter (Signed)
Spoke with Alroy Bailiff, states that she discussed this with pt while getting flu shot.  Nothing further needed.

## 2017-03-09 DIAGNOSIS — Z23 Encounter for immunization: Secondary | ICD-10-CM

## 2017-03-11 ENCOUNTER — Ambulatory Visit (INDEPENDENT_AMBULATORY_CARE_PROVIDER_SITE_OTHER): Payer: Medicare Other

## 2017-03-11 DIAGNOSIS — J4540 Moderate persistent asthma, uncomplicated: Secondary | ICD-10-CM | POA: Diagnosis not present

## 2017-03-15 MED ORDER — OMALIZUMAB 150 MG ~~LOC~~ SOLR
300.0000 mg | SUBCUTANEOUS | Status: DC
  Administered 2017-03-11: 300 mg via SUBCUTANEOUS

## 2017-03-16 DIAGNOSIS — L308 Other specified dermatitis: Secondary | ICD-10-CM | POA: Diagnosis not present

## 2017-03-18 ENCOUNTER — Other Ambulatory Visit: Payer: Self-pay | Admitting: *Deleted

## 2017-03-18 MED ORDER — RIVAROXABAN 20 MG PO TABS: ORAL_TABLET | ORAL | 6 refills | Status: DC

## 2017-03-18 NOTE — Telephone Encounter (Signed)
Pt last saw Roderic Palau, NP on 12/30/16, last labs 02/16/17 Creat 1.02, age 76, weight 94.3kg, CrCl 69.85, based on CrCl pt is on appropriate dosage of Xarelto 20mg  QD.  Will refill rx.

## 2017-03-18 NOTE — Telephone Encounter (Signed)
Pharmacy requests ninety day rx. 

## 2017-03-25 ENCOUNTER — Ambulatory Visit: Payer: Medicare Other

## 2017-03-25 ENCOUNTER — Ambulatory Visit (INDEPENDENT_AMBULATORY_CARE_PROVIDER_SITE_OTHER): Payer: Medicare Other

## 2017-03-25 DIAGNOSIS — J449 Chronic obstructive pulmonary disease, unspecified: Secondary | ICD-10-CM | POA: Diagnosis not present

## 2017-03-28 ENCOUNTER — Telehealth: Payer: Self-pay | Admitting: Internal Medicine

## 2017-03-28 NOTE — Telephone Encounter (Signed)
#   vials:4 Ordered date:03/28/17 Shipping Date:03/28/17 

## 2017-03-29 NOTE — Telephone Encounter (Signed)
#   Vials:4 Arrival Date:03/29/17 Lot #:[id number] Exp Date:07/2020  

## 2017-03-30 ENCOUNTER — Other Ambulatory Visit: Payer: Self-pay | Admitting: Internal Medicine

## 2017-03-30 DIAGNOSIS — Z1231 Encounter for screening mammogram for malignant neoplasm of breast: Secondary | ICD-10-CM

## 2017-03-30 MED ORDER — OMALIZUMAB 150 MG ~~LOC~~ SOLR
300.0000 mg | Freq: Once | SUBCUTANEOUS | Status: AC
  Administered 2017-03-25: 300 mg via SUBCUTANEOUS

## 2017-04-04 ENCOUNTER — Ambulatory Visit
Admission: RE | Admit: 2017-04-04 | Discharge: 2017-04-04 | Payer: MEDICARE | Attending: Dermatology | Admitting: Dermatology

## 2017-04-04 DIAGNOSIS — L309 Dermatitis, unspecified: Principal | ICD-10-CM

## 2017-04-04 DIAGNOSIS — L308 Other specified dermatitis: Secondary | ICD-10-CM | POA: Diagnosis not present

## 2017-04-04 MED ORDER — HYDROXYZINE HCL 25 MG TABLET
ORAL_TABLET | 0 refills | 0 days | Status: CP
Start: 2017-04-04 — End: 2017-04-25

## 2017-04-04 MED ORDER — DESOXIMETASONE 0.25 % TOPICAL OINTMENT
5 refills | 0 days | Status: CP
Start: 2017-04-04 — End: 2017-04-07

## 2017-04-07 MED ORDER — TRIAMCINOLONE ACETONIDE 0.1 % TOPICAL OINTMENT
INTRAMUSCULAR | 3 refills | 0.00000 days | Status: CP
Start: 2017-04-07 — End: ?

## 2017-04-08 ENCOUNTER — Ambulatory Visit (INDEPENDENT_AMBULATORY_CARE_PROVIDER_SITE_OTHER): Payer: Medicare Other

## 2017-04-08 DIAGNOSIS — J449 Chronic obstructive pulmonary disease, unspecified: Secondary | ICD-10-CM | POA: Diagnosis not present

## 2017-04-11 MED ORDER — OMALIZUMAB 150 MG ~~LOC~~ SOLR
300.0000 mg | SUBCUTANEOUS | Status: DC
  Administered 2017-04-08: 300 mg via SUBCUTANEOUS

## 2017-04-11 NOTE — Progress Notes (Signed)
Documentation of medication administration and charges of Xolair have been completed by Haydon Kalmar, CMA based on the Xolair documentation sheet completed by Ashley Caulfield, RMA. 

## 2017-04-18 ENCOUNTER — Ambulatory Visit
Admission: RE | Admit: 2017-04-18 | Discharge: 2017-04-18 | Payer: MEDICARE | Attending: Dermatology | Admitting: Dermatology

## 2017-04-18 DIAGNOSIS — L309 Dermatitis, unspecified: Principal | ICD-10-CM

## 2017-04-22 ENCOUNTER — Ambulatory Visit (INDEPENDENT_AMBULATORY_CARE_PROVIDER_SITE_OTHER): Payer: Medicare Other

## 2017-04-22 DIAGNOSIS — J449 Chronic obstructive pulmonary disease, unspecified: Secondary | ICD-10-CM | POA: Diagnosis not present

## 2017-04-25 ENCOUNTER — Ambulatory Visit
Admission: RE | Admit: 2017-04-25 | Discharge: 2017-04-25 | Payer: MEDICARE | Attending: MOHS-Micrographic Surgery | Admitting: MOHS-Micrographic Surgery

## 2017-04-25 DIAGNOSIS — L309 Dermatitis, unspecified: Principal | ICD-10-CM

## 2017-04-25 DIAGNOSIS — L820 Inflamed seborrheic keratosis: Secondary | ICD-10-CM

## 2017-04-25 DIAGNOSIS — Z79899 Other long term (current) drug therapy: Secondary | ICD-10-CM

## 2017-04-25 DIAGNOSIS — L308 Other specified dermatitis: Secondary | ICD-10-CM | POA: Diagnosis not present

## 2017-04-25 MED ORDER — OMALIZUMAB 150 MG ~~LOC~~ SOLR
300.0000 mg | Freq: Once | SUBCUTANEOUS | Status: AC
  Administered 2017-04-22: 300 mg via SUBCUTANEOUS

## 2017-04-25 MED ORDER — METHOTREXATE SODIUM 2.5 MG TABLET
ORAL_TABLET | ORAL | 1 refills | 0 days | Status: CP
Start: 2017-04-25 — End: 2017-05-20

## 2017-04-25 MED ORDER — FOLIC ACID 1 MG TABLET
ORAL_TABLET | 3 refills | 0 days | Status: CP
Start: 2017-04-25 — End: 2017-06-06

## 2017-04-28 ENCOUNTER — Encounter: Payer: Self-pay | Admitting: Physician Assistant

## 2017-04-28 DIAGNOSIS — I5189 Other ill-defined heart diseases: Secondary | ICD-10-CM | POA: Insufficient documentation

## 2017-04-28 NOTE — Progress Notes (Signed)
Cardiology Office Note    Date:  04/29/2017  ID:  Laura Mendez, DOB 1941/01/15, MRN [id number] PCP:  Hoyt Koch, MD  Cardiologist:  Dr. Marlou Porch   Chief Complaint: 6 month f/u atrial flutter  History of Present Illness:  Laura Mendez is a 76 y.o. female with history of COPD, asthma, TIA, paroxysmal atrial flutter on Xarelto who presents to clinic for follow up.   She was initially diagnosed with atrial flutter in 11/2015. She presented to the ER with chest/arm pain and was found to be tachycardic. She was on a prednisone taper for a rash at that time. She spontaneously converted in the ER but was placed on Xarelto for a CHADSVASC of at least 5. (Prior to this event, she was taking daily Plavix for partial loss of vision left eye 5 years prior.) She was also placed on a BB which was later converted to a CCB given her history of severe extrinsic asthma. 2D echo 12/2015 showed normal LV function 55-60%, mild LVH, grade 1 DD, high ventricular hilling pressure, severely calcified mitral valve annulus, mild MR, severely dilated LA, mildly dilated RA. She was followed by Roderic Palau in the afib clinic and later established care with Dr. Marlou Porch in 04/2016. In August 2018 she was seen by Roderic Palau who had noted she had recently had issues with rash. Cardizem was stopped and she was started on verapamil, but itching recurred so both were discontinued. [name] recommended consideration of atrial flutter ablation in the future if this should recur. Labs 01/2017 showed Hgb 14.7, plt 268, TSH wnl, K 4.3, BUN 16/Cr 1.02, normal LFTs, A1C 5.6.  She returns for follow-up doing well from a cardiac perspective. No chest pain, palpitations, SOB, diaphoresis, syncope. Exercises regularly without functional limitation. Still plagued by rash - has seen multiple dermatologists without clear cut answers. Denies any bleeding difficulties.Brings in log of BP with all normotensive values, generally 063-016  systolic.  Past Medical History:  Diagnosis Date  . Chronic airway obstruction, not elsewhere classified   . Diastolic dysfunction without heart failure   . Disorder of bone and cartilage, unspecified   . Paroxysmal atrial flutter ([place])   . TIA (transient ischemic attack)   . Unspecified asthma(493.90)     Past Surgical History:  Procedure Laterality Date  . ABDOMINAL HYSTERECTOMY      Current Medications: Current Meds  Medication Sig  . cholecalciferol (VITAMIN D) 1000 units tablet Take 1,000 Units by mouth daily.  . fish oil-omega-3 fatty acids 1000 MG capsule Take 2 g by mouth daily.    . Fluticasone-Salmeterol (ADVAIR) 250-50 MCG/DOSE AEPB Inhale 1 puff into the lungs 2 (two) times daily.  . folic acid (FOLVITE) 1 MG tablet TAKE 1 TABLET DAILY ON THE DAYS YOU DO NOT TAKE METHOTREXATE.  . hydrOXYzine (ATARAX/VISTARIL) 25 MG tablet TAKE 1 TABLET BY MOUTH AT BEDTIME **WATCH FOR SEDATION  . MEGARED OMEGA-3 KRILL OIL 500 MG CAPS Take 1 capsule by mouth daily.  . methotrexate (RHEUMATREX) 2.5 MG tablet Take 7.5 mg by mouth.  . mometasone (ELOCON) 0.1 % cream Apply 1 application topically daily.  . Multiple Vitamin (MULTIVITAMIN) tablet Take 1 tablet by mouth daily.   Marland Kitchen omalizumab (XOLAIR) 150 MG injection Inject 150 mg into the skin.  . rivaroxaban (XARELTO) 20 MG TABS tablet TAKE 1 TABLET BY MOUTH EVERY DAY WITH SUPPER  . triamcinolone ointment (KENALOG) 0.1 % APPLY TWICE A DAY TO ITCHY, RED, SCALY AREAS UNTIL SMOOTH  .  VENTOLIN HFA 108 (90 Base) MCG/ACT inhaler TAKE 2 PUFFS EVERY 4 HOURS AS NEEDED (RESCUE)     Allergies:   Ibuprofen; Clarithromycin; Codeine phosphate; Diltiazem; and Verapamil hcl er   Social History   Socioeconomic History  . Marital status: Divorced    Spouse name: None  . Number of children: None  . Years of education: None  . Highest education level: None  Social Needs  . Financial resource strain: None  . Food insecurity - worry: None  . Food  insecurity - inability: None  . Transportation needs - medical: None  . Transportation needs - non-medical: None  Occupational History  . Occupation: retired  Tobacco Use  . Smoking status: Former Smoker    Years: 0.00    Last attempt to quit: 05/24/1977    Years since quitting: 39.9  . Smokeless tobacco: Never Used  Substance and Sexual Activity  . Alcohol use: No  . Drug use: No  . Sexual activity: None  Other Topics Concern  . None  Social History Narrative  . None     Family History:  Family History  Problem Relation Age of Onset  . COPD Father   . Cancer Mother   . Kidney cancer Sister   . Lung cancer Sister   . Ovarian cancer Sister   . Liver disease Sister   . Melanoma Sister     ROS:   Please see the history of present illness. Reports great toenail on L foot has turned black after rubbing against shoe for so long All other systems are reviewed and otherwise negative.    PHYSICAL EXAM:   VS:  BP 132/66   Pulse 72   Resp 16   Ht 5\' 10"  (1.778 m)   Wt 209 lb 1.9 oz (94.9 kg)   SpO2 97%   BMI 30.01 kg/m   BMI: Body mass index is 30.01 kg/m. GEN: Well nourished, well developed WF, in no acute distress  HEENT: normocephalic, atraumatic Neck: no JVD, carotid bruits, or masses Cardiac: RRR; no murmurs, rubs, or gallops, no edema, 2+ Pedal pulses bilaterally Respiratory:  clear to auscultation bilaterally, normal work of breathing GI: soft, nontender, nondistended, + BS MS: no deformity or atrophy  Skin: warm and dry, diffuse papular rash, also skin abrasions noted on elbow Neuro:  Alert and Oriented x 3, Strength and sensation are intact, follows commands Psych: euthymic mood, full affect  Wt Readings from Last 3 Encounters:  04/29/17 209 lb 1.9 oz (94.9 kg)  02/16/17 208 lb (94.3 kg)  12/30/16 209 lb (94.8 kg)      Studies/Labs Reviewed:   EKG:  EKG was ordered today and personally reviewed by me and demonstrates NSR 68bp, baseline wander, no acute  ST-T Changes  Recent Labs: 02/16/2017: ALT 24; BUN 16; Creatinine, Ser 1.02; Hemoglobin 14.7; Platelets 268.0; Potassium 4.3; Sodium 143; TSH 2.60   Lipid Panel    Component Value Date/Time   CHOL 214 (H) 01/13/2016 0922   TRIG 164.0 (H) 01/13/2016 0922   TRIG 183 (H) 03/04/2006 1034   HDL 52.00 01/13/2016 0922   CHOLHDL 4 01/13/2016 0922   VLDL 32.8 01/13/2016 0922   LDLCALC 129 (H) 01/13/2016 0922   LDLDIRECT 106.0 08/13/2015 0743    Additional studies/ records that were reviewed today include: Summarized above.    ASSESSMENT & PLAN:   1. Paroxysmal atrial flutter - maintaining NSR. Not clear that her diltiazem/verapamil ever were actually involved in her rash as this has persisted  even despite discontinuation. Rash was also present prior to initiation of Xarelto so it does not seem this is at all related to any of her cardiac meds. She will continue to follow with dermatology. 2. Chronic anticoagulation - denies bleeding. She does state Xarelto is costly but not cost-prohibitive. She has investigated Eliquis but did not find the difference to be much cheaper. She does not wish to change to Coumadin at this time. 3. Diastolic dysfunction without heart failure - appears euvolemic. Continue BP control. Discussed 2g sodium goal. 4. History of TIA - continue Eliquis.  Disposition: F/u with Dr. Marlou Porch in 6 months. Continue to follow with derm including for darkened right toenail bed.   Medication Adjustments/Labs and Tests Ordered: Current medicines are reviewed at length with the patient today.  Concerns regarding medicines are outlined above. Medication changes, Labs and Tests ordered today are summarized above and listed in the Patient Instructions accessible in Encounters.   Signed, Charlie Pitter, PA-C  04/29/2017 9:49 AM    [address], [place], [place]  [address] Phone: [phone]; Fax: [phone]

## 2017-04-29 ENCOUNTER — Ambulatory Visit: Payer: Medicare Other | Admitting: Physician Assistant

## 2017-04-29 ENCOUNTER — Encounter: Payer: Self-pay | Admitting: Physician Assistant

## 2017-04-29 ENCOUNTER — Telehealth: Payer: Self-pay | Admitting: Internal Medicine

## 2017-04-29 VITALS — BP 132/66 | HR 72 | Resp 16 | Ht 70.0 in | Wt 209.1 lb

## 2017-04-29 DIAGNOSIS — Z7901 Long term (current) use of anticoagulants: Secondary | ICD-10-CM | POA: Diagnosis not present

## 2017-04-29 DIAGNOSIS — I4892 Unspecified atrial flutter: Secondary | ICD-10-CM | POA: Diagnosis not present

## 2017-04-29 DIAGNOSIS — Z8673 Personal history of transient ischemic attack (TIA), and cerebral infarction without residual deficits: Secondary | ICD-10-CM

## 2017-04-29 DIAGNOSIS — I519 Heart disease, unspecified: Secondary | ICD-10-CM | POA: Diagnosis not present

## 2017-04-29 DIAGNOSIS — I5189 Other ill-defined heart diseases: Secondary | ICD-10-CM

## 2017-04-29 NOTE — Patient Instructions (Signed)
Medication Instructions:  Your physician recommends that you continue on your current medications as directed. Please refer to the Current Medication list given to you today.   Labwork: None ordered  Testing/Procedures: None ordered  Follow-Up: Your physician wants you to follow-up in: [place] will receive a reminder letter in the mail two months in advance. If you don't receive a letter, please call our office to schedule the follow-up appointment.   Any Other Special Instructions Will Be Listed Below (If Applicable).     If you need a refill on your cardiac medications before your next appointment, please call your pharmacy.

## 2017-04-29 NOTE — Telephone Encounter (Signed)
#   vials:4 Ordered date:04/29/17 Shipping Date:05/02/17 

## 2017-05-03 MED ORDER — HYDROXYZINE HCL 25 MG TABLET
ORAL_TABLET | Freq: Four times a day (QID) | ORAL | 0 refills | 0.00000 days | Status: CP | PRN
Start: 2017-05-03 — End: 2017-06-06

## 2017-05-04 NOTE — Telephone Encounter (Signed)
#   Vials:4 Arrival Date:05/04/17 Lot #:[id number] Exp Date:07/2020  

## 2017-05-06 ENCOUNTER — Ambulatory Visit (INDEPENDENT_AMBULATORY_CARE_PROVIDER_SITE_OTHER): Payer: Medicare Other

## 2017-05-06 ENCOUNTER — Ambulatory Visit: Payer: Medicare Other

## 2017-05-06 DIAGNOSIS — J4540 Moderate persistent asthma, uncomplicated: Secondary | ICD-10-CM | POA: Diagnosis not present

## 2017-05-09 ENCOUNTER — Ambulatory Visit: Payer: Medicare Other

## 2017-05-09 MED ORDER — OMALIZUMAB 150 MG ~~LOC~~ SOLR
300.0000 mg | SUBCUTANEOUS | Status: DC
  Administered 2017-05-06: 300 mg via SUBCUTANEOUS

## 2017-05-09 NOTE — Progress Notes (Signed)
Documentation of medication administration and charges of Xolair have been completed by Elveria Lauderbaugh, CMA based on the Xolair documentation sheet completed by Tammy Scott.  

## 2017-05-10 ENCOUNTER — Ambulatory Visit: Payer: Medicare Other

## 2017-05-10 ENCOUNTER — Ambulatory Visit
Admission: RE | Admit: 2017-05-10 | Discharge: 2017-05-10 | Disposition: A | Discharge status: Home / Self Care | Payer: Medicare Other | Source: Ambulatory Visit | Attending: Internal Medicine | Admitting: Internal Medicine

## 2017-05-10 DIAGNOSIS — Z1231 Encounter for screening mammogram for malignant neoplasm of breast: Secondary | ICD-10-CM

## 2017-05-11 ENCOUNTER — Other Ambulatory Visit: Payer: Self-pay | Admitting: Internal Medicine

## 2017-05-11 DIAGNOSIS — R928 Other abnormal and inconclusive findings on diagnostic imaging of breast: Secondary | ICD-10-CM

## 2017-05-12 ENCOUNTER — Other Ambulatory Visit: Payer: Self-pay | Admitting: Internal Medicine

## 2017-05-12 ENCOUNTER — Ambulatory Visit
Admission: RE | Admit: 2017-05-12 | Discharge: 2017-05-12 | Disposition: A | Discharge status: Home / Self Care | Payer: Medicare Other | Source: Ambulatory Visit | Attending: Internal Medicine | Admitting: Internal Medicine

## 2017-05-12 DIAGNOSIS — R928 Other abnormal and inconclusive findings on diagnostic imaging of breast: Secondary | ICD-10-CM

## 2017-05-12 DIAGNOSIS — N6310 Unspecified lump in the right breast, unspecified quadrant: Secondary | ICD-10-CM

## 2017-05-12 DIAGNOSIS — N6314 Unspecified lump in the right breast, lower inner quadrant: Secondary | ICD-10-CM | POA: Diagnosis not present

## 2017-05-19 DIAGNOSIS — Z79899 Other long term (current) drug therapy: Secondary | ICD-10-CM | POA: Diagnosis not present

## 2017-05-20 ENCOUNTER — Ambulatory Visit: Payer: Medicare Other

## 2017-05-20 ENCOUNTER — Ambulatory Visit (INDEPENDENT_AMBULATORY_CARE_PROVIDER_SITE_OTHER): Payer: Medicare Other

## 2017-05-20 DIAGNOSIS — J4540 Moderate persistent asthma, uncomplicated: Secondary | ICD-10-CM

## 2017-05-20 MED ORDER — METHOTREXATE SODIUM 2.5 MG TABLET
ORAL_TABLET | ORAL | 0 refills | 0.00000 days | Status: CP
Start: 2017-05-20 — End: 2017-06-06

## 2017-05-23 ENCOUNTER — Ambulatory Visit: Payer: Medicare Other

## 2017-05-25 ENCOUNTER — Ambulatory Visit: Payer: Medicare Other

## 2017-05-25 ENCOUNTER — Telehealth: Payer: Self-pay | Admitting: Internal Medicine

## 2017-05-25 NOTE — Telephone Encounter (Signed)
#   PFS:6 Ordered date:05/25/17 Shipping Date:05/25/17

## 2017-05-26 NOTE — Telephone Encounter (Signed)
#   PFS:4 Arrival Date:05/26/17 Lot #:[id number] Exp Date:04/2018  

## 2017-05-31 MED ORDER — OMALIZUMAB 150 MG ~~LOC~~ SOLR
300.0000 mg | SUBCUTANEOUS | Status: DC
  Administered 2017-05-20: 300 mg via SUBCUTANEOUS

## 2017-05-31 NOTE — Progress Notes (Signed)
Documentation of medication administration and charges of Xolair have been completed by Kenaz Olafson, CMA based on the Xolair documentation sheet completed by Tammy Scott.  

## 2017-06-03 ENCOUNTER — Ambulatory Visit (INDEPENDENT_AMBULATORY_CARE_PROVIDER_SITE_OTHER): Payer: Medicare Other

## 2017-06-03 DIAGNOSIS — J4540 Moderate persistent asthma, uncomplicated: Secondary | ICD-10-CM | POA: Diagnosis not present

## 2017-06-06 ENCOUNTER — Encounter: Admit: 2017-06-06 | Discharge: 2017-06-07 | Payer: MEDICARE | Attending: Dermatology | Primary: Dermatology

## 2017-06-06 DIAGNOSIS — R21 Rash and other nonspecific skin eruption: Secondary | ICD-10-CM

## 2017-06-06 DIAGNOSIS — L309 Dermatitis, unspecified: Secondary | ICD-10-CM

## 2017-06-06 DIAGNOSIS — L308 Other specified dermatitis: Principal | ICD-10-CM

## 2017-06-06 DIAGNOSIS — Z79899 Other long term (current) drug therapy: Secondary | ICD-10-CM

## 2017-06-06 MED ORDER — OMALIZUMAB 150 MG ~~LOC~~ SOLR
300.0000 mg | SUBCUTANEOUS | Status: DC
  Administered 2017-06-03: 300 mg via SUBCUTANEOUS

## 2017-06-06 MED ORDER — METHOTREXATE SODIUM 2.5 MG TABLET
ORAL_TABLET | 3 refills | 0 days | Status: CP
Start: 2017-06-06 — End: 2018-02-20

## 2017-06-06 MED ORDER — BETAMETHASONE DIPROPIONATE 0.05 % TOPICAL OINTMENT
1 refills | 0 days | Status: CP
Start: 2017-06-06 — End: 2018-02-20

## 2017-06-06 MED ORDER — HYDROXYZINE HCL 25 MG TABLET
ORAL_TABLET | Freq: Four times a day (QID) | ORAL | 1 refills | 0.00000 days | Status: CP | PRN
Start: 2017-06-06 — End: 2017-10-28

## 2017-06-06 MED ORDER — FOLIC ACID 1 MG TABLET
ORAL_TABLET | 3 refills | 0 days | Status: CP
Start: 2017-06-06 — End: 2018-02-20

## 2017-06-06 NOTE — Progress Notes (Signed)
Documentation of medication administration and charges of Xolair have been completed by Lindsay Lemons, CMA based on the Xolair documentation sheet completed by Tammy Scott.  

## 2017-06-17 ENCOUNTER — Ambulatory Visit (INDEPENDENT_AMBULATORY_CARE_PROVIDER_SITE_OTHER): Payer: Medicare Other

## 2017-06-17 DIAGNOSIS — J4540 Moderate persistent asthma, uncomplicated: Secondary | ICD-10-CM

## 2017-06-20 MED ORDER — OMALIZUMAB 150 MG ~~LOC~~ SOLR
300.0000 mg | SUBCUTANEOUS | Status: DC
  Administered 2017-06-17: 300 mg via SUBCUTANEOUS

## 2017-06-20 NOTE — Progress Notes (Signed)
Documentation of medication administration and charges of Xolair have been completed by Lindsay Lemons, CMA based on the Xolair documentation sheet completed by Tammy Scott.  

## 2017-06-21 ENCOUNTER — Telehealth: Payer: Self-pay | Admitting: Internal Medicine

## 2017-06-22 ENCOUNTER — Other Ambulatory Visit: Payer: Medicare Other

## 2017-06-22 ENCOUNTER — Encounter (HOSPITAL_COMMUNITY): Payer: Self-pay | Admitting: Nurse Practitioner

## 2017-06-22 ENCOUNTER — Telehealth: Payer: Self-pay | Admitting: Internal Medicine

## 2017-06-22 ENCOUNTER — Ambulatory Visit (HOSPITAL_COMMUNITY)
Admission: RE | Admit: 2017-06-22 | Discharge: 2017-06-22 | Disposition: A | Discharge status: Home / Self Care | Payer: Medicare Other | Source: Ambulatory Visit | Attending: Nurse Practitioner | Admitting: Nurse Practitioner

## 2017-06-22 ENCOUNTER — Ambulatory Visit: Payer: Medicare Other | Admitting: Internal Medicine

## 2017-06-22 ENCOUNTER — Ambulatory Visit (INDEPENDENT_AMBULATORY_CARE_PROVIDER_SITE_OTHER)
Admission: RE | Admit: 2017-06-22 | Discharge: 2017-06-22 | Disposition: A | Discharge status: Home / Self Care | Payer: Medicare Other | Source: Ambulatory Visit | Attending: Internal Medicine | Admitting: Internal Medicine

## 2017-06-22 ENCOUNTER — Encounter: Payer: Self-pay | Admitting: Internal Medicine

## 2017-06-22 VITALS — BP 116/74 | HR 62 | Ht 70.0 in | Wt 209.2 lb

## 2017-06-22 VITALS — BP 146/80 | HR 74 | Ht 70.0 in | Wt 209.2 lb

## 2017-06-22 DIAGNOSIS — Z888 Allergy status to other drugs, medicaments and biological substances status: Secondary | ICD-10-CM | POA: Insufficient documentation

## 2017-06-22 DIAGNOSIS — Z8041 Family history of malignant neoplasm of ovary: Secondary | ICD-10-CM | POA: Insufficient documentation

## 2017-06-22 DIAGNOSIS — Z825 Family history of asthma and other chronic lower respiratory diseases: Secondary | ICD-10-CM | POA: Insufficient documentation

## 2017-06-22 DIAGNOSIS — Z801 Family history of malignant neoplasm of trachea, bronchus and lung: Secondary | ICD-10-CM | POA: Diagnosis not present

## 2017-06-22 DIAGNOSIS — Z886 Allergy status to analgesic agent status: Secondary | ICD-10-CM | POA: Diagnosis not present

## 2017-06-22 DIAGNOSIS — Z885 Allergy status to narcotic agent status: Secondary | ICD-10-CM | POA: Insufficient documentation

## 2017-06-22 DIAGNOSIS — Z8489 Family history of other specified conditions: Secondary | ICD-10-CM | POA: Diagnosis not present

## 2017-06-22 DIAGNOSIS — J449 Chronic obstructive pulmonary disease, unspecified: Secondary | ICD-10-CM

## 2017-06-22 DIAGNOSIS — Z7902 Long term (current) use of antithrombotics/antiplatelets: Secondary | ICD-10-CM | POA: Insufficient documentation

## 2017-06-22 DIAGNOSIS — D899 Disorder involving the immune mechanism, unspecified: Secondary | ICD-10-CM | POA: Diagnosis not present

## 2017-06-22 DIAGNOSIS — Z87891 Personal history of nicotine dependence: Secondary | ICD-10-CM | POA: Diagnosis not present

## 2017-06-22 DIAGNOSIS — Z8051 Family history of malignant neoplasm of kidney: Secondary | ICD-10-CM | POA: Diagnosis not present

## 2017-06-22 DIAGNOSIS — I483 Typical atrial flutter: Secondary | ICD-10-CM | POA: Insufficient documentation

## 2017-06-22 DIAGNOSIS — I4892 Unspecified atrial flutter: Secondary | ICD-10-CM

## 2017-06-22 DIAGNOSIS — Z7901 Long term (current) use of anticoagulants: Secondary | ICD-10-CM | POA: Diagnosis not present

## 2017-06-22 DIAGNOSIS — Z8673 Personal history of transient ischemic attack (TIA), and cerebral infarction without residual deficits: Secondary | ICD-10-CM | POA: Diagnosis not present

## 2017-06-22 DIAGNOSIS — R21 Rash and other nonspecific skin eruption: Secondary | ICD-10-CM

## 2017-06-22 DIAGNOSIS — Z79899 Other long term (current) drug therapy: Secondary | ICD-10-CM | POA: Insufficient documentation

## 2017-06-22 DIAGNOSIS — D849 Immunodeficiency, unspecified: Secondary | ICD-10-CM

## 2017-06-22 DIAGNOSIS — Z90710 Acquired absence of both cervix and uterus: Secondary | ICD-10-CM | POA: Diagnosis not present

## 2017-06-22 DIAGNOSIS — J45901 Unspecified asthma with (acute) exacerbation: Secondary | ICD-10-CM | POA: Diagnosis not present

## 2017-06-22 IMAGING — DX DG CHEST 2V
2 series · 2 of 2 positions shown · non-contrast
Comparison: [DATE]

CLINICAL DATA: Asthma. No chest complaints. Ex-smoker, quitting in
[TC].

EXAM:
CHEST  2 VIEW

[chest pa]
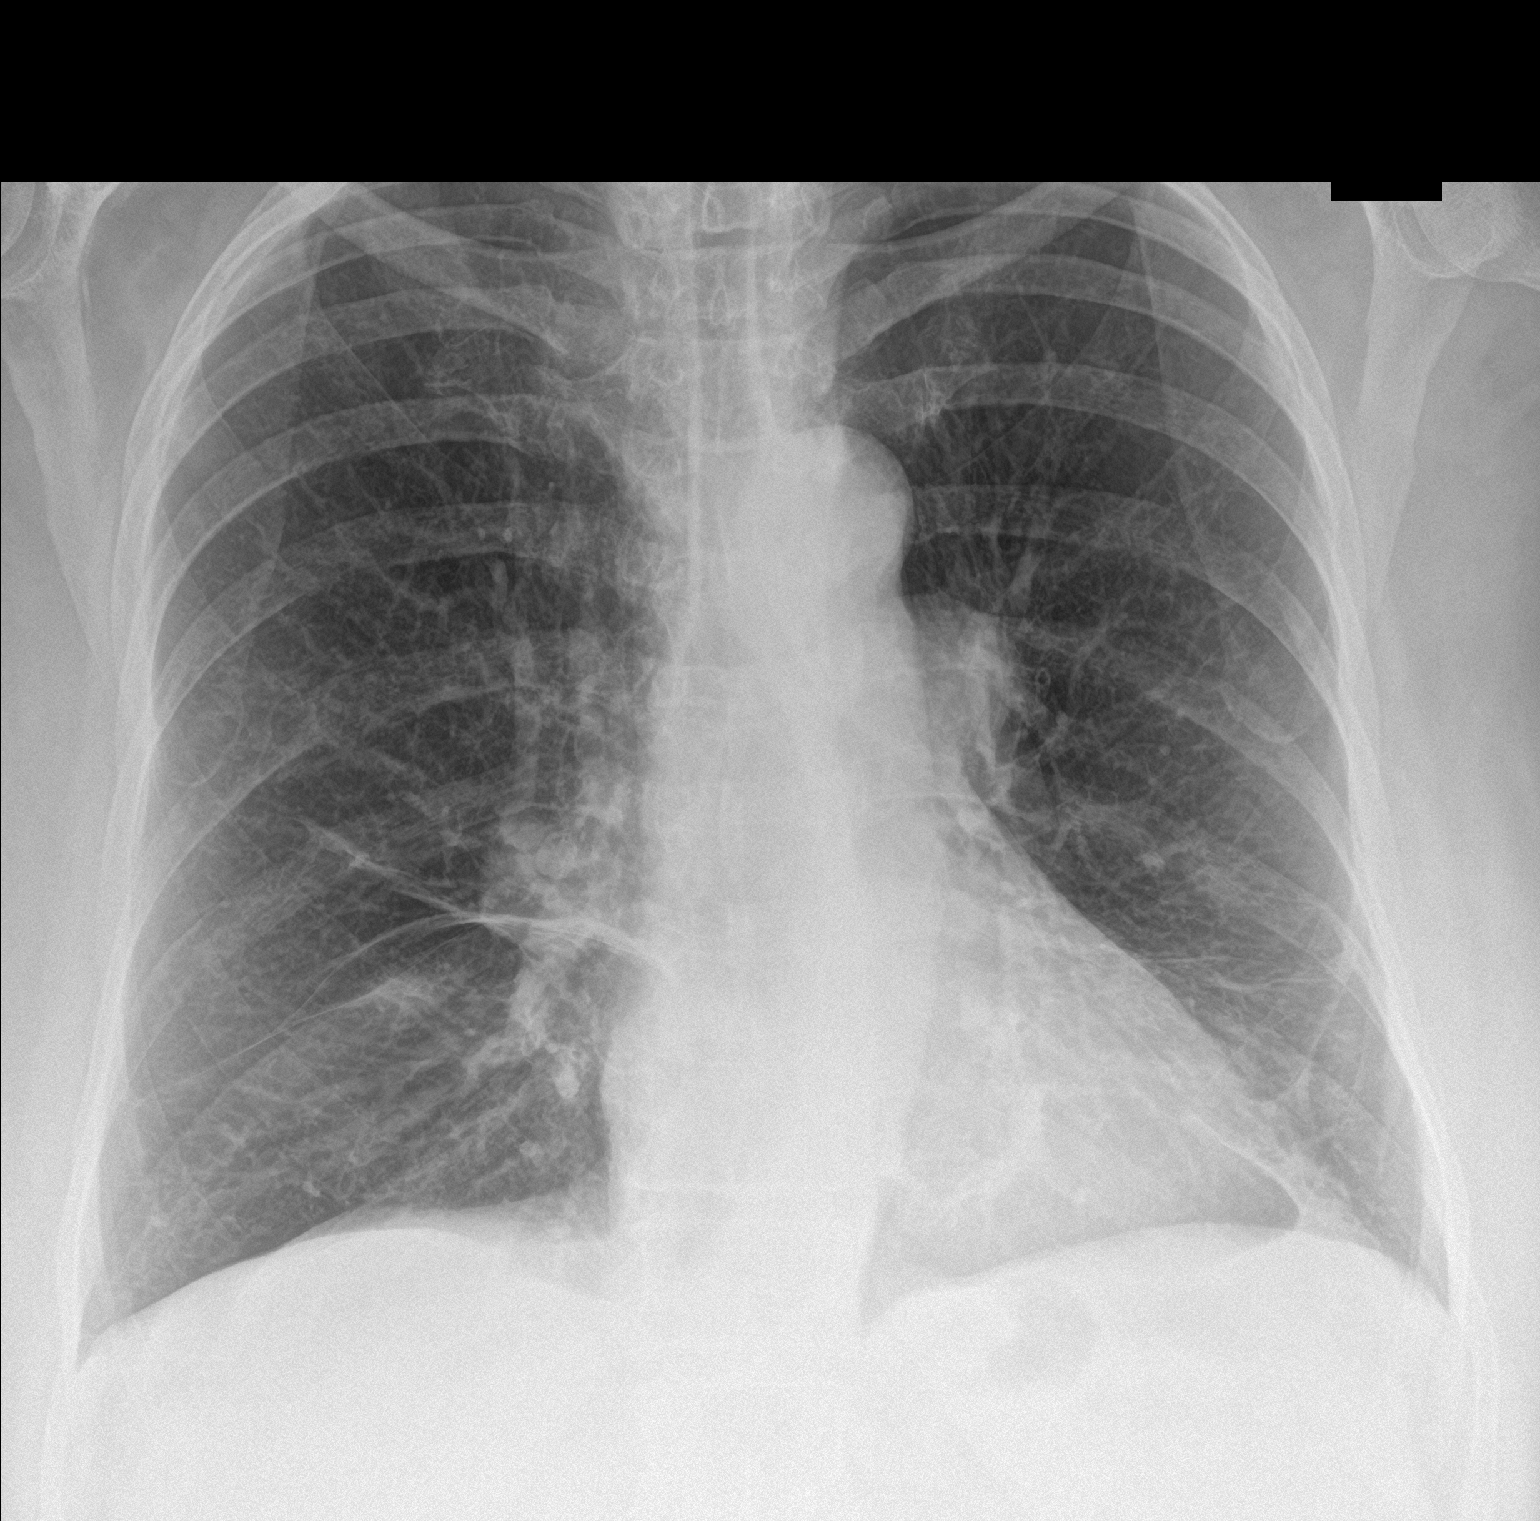

[chest lat]
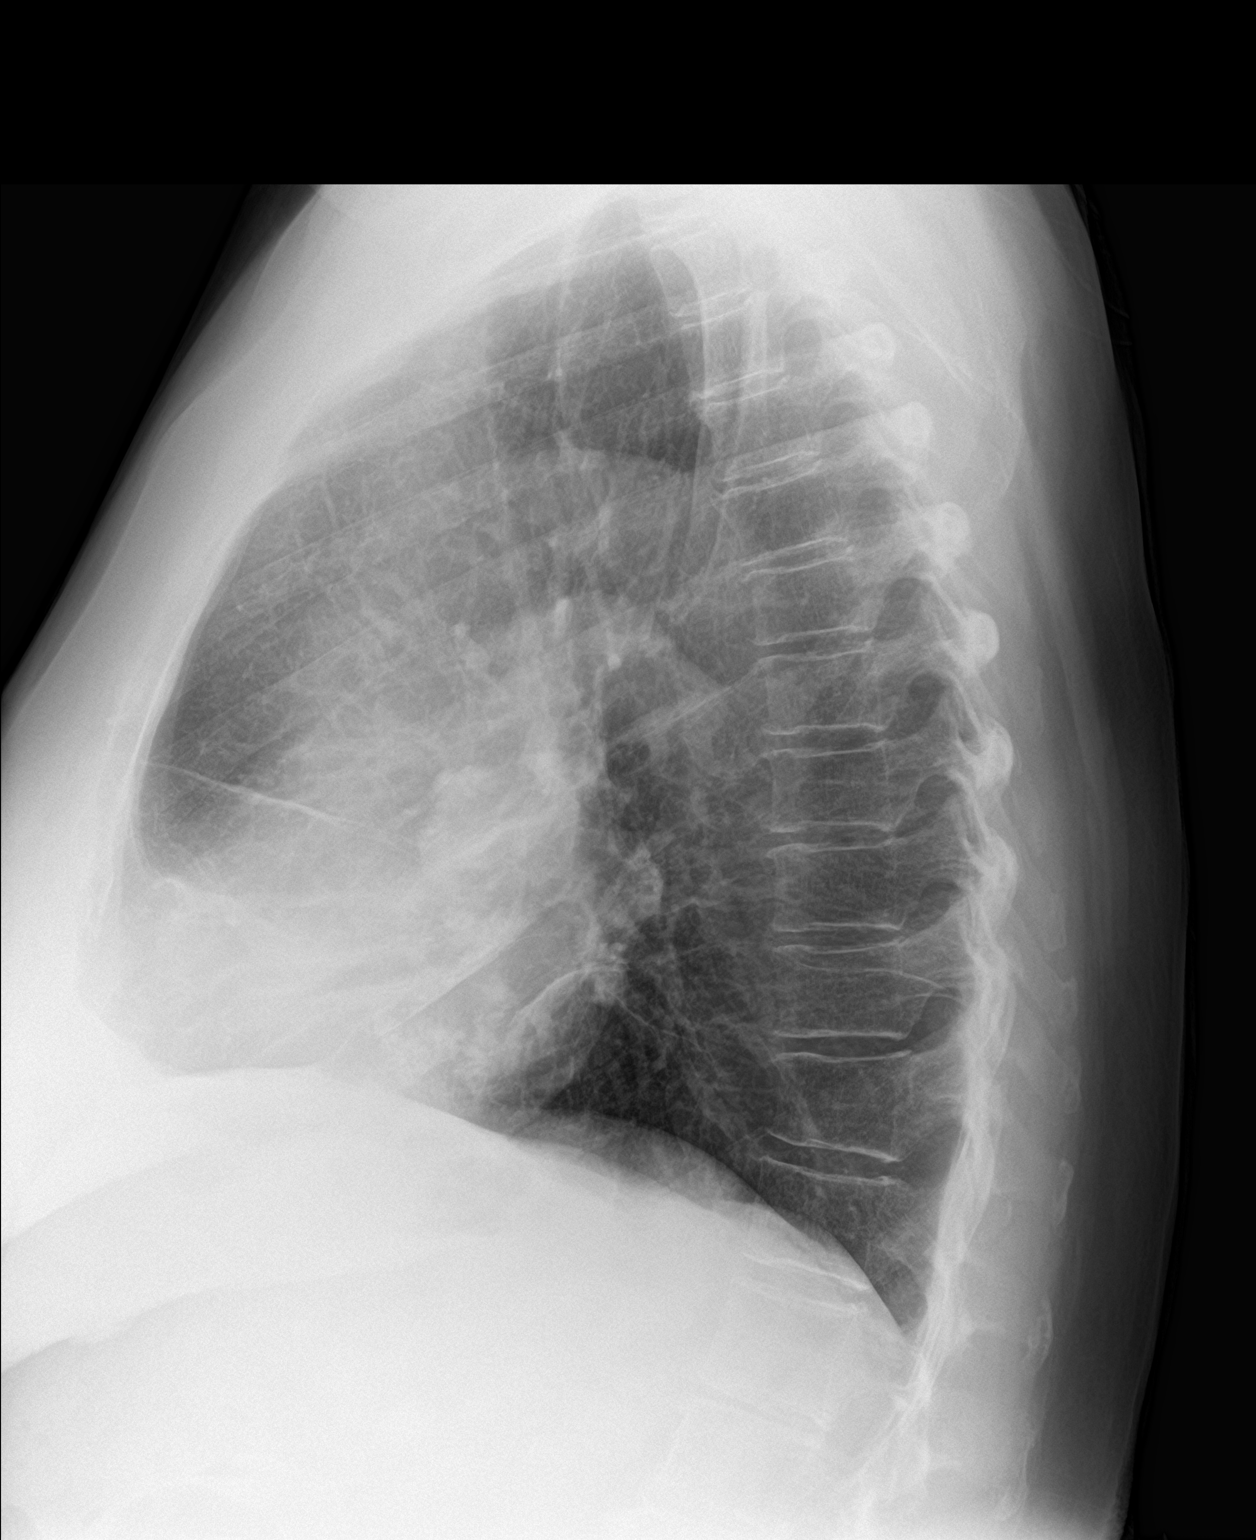

[2 of 2 positions shown; findings below may reference images not displayed]

FINDINGS: Mild hyperinflation. Midline trachea. Normal heart size and
mediastinal contours. Mitral annular calcifications. No pleural
effusion or pneumothorax. Right infrahilar and lateral left lower
lobe scarring, similar.
IMPRESSION: Hyperinflation and bilateral scarring. No acute superimposed
process.

## 2017-06-22 NOTE — Telephone Encounter (Signed)
error 

## 2017-06-22 NOTE — Assessment & Plan Note (Addendum)
She has had elevated IgE and eosinophil counts.  After 7 years on Xolair, we are going to try a switch to Nucala, partly to see if there is any change in her rash. -Replace Xolair with Nucala, CXR, Update TB exposure history with QuantiFERON gold.

## 2017-06-22 NOTE — Patient Instructions (Signed)
Order- CXR     Dx asthma with COPD moderate persistent  Order- Quantiferon TB assay   Dx immunosuppressed  We should consider changing from Xolair to [employer] [facility]

## 2017-06-22 NOTE — Telephone Encounter (Signed)
#   PFS:4 Ordered date:06/21/17 Shipping Date:06/21/17

## 2017-06-22 NOTE — Telephone Encounter (Signed)
Error

## 2017-06-22 NOTE — Assessment & Plan Note (Signed)
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  We will see if it is affected by change from Xolair to [facility].  Xolair representative indicates Xolair may stay in the bloodstream as much as a year.

## 2017-06-22 NOTE — Progress Notes (Signed)
Patient ID: Laura Mendez, female   DOB: 1941/04/09, 77 y.o.   MRN: [id number]     Primary Care Physician: Hoyt Koch, MD Referring Physician: ER f/u   Laura Mendez is a 77 y.o. female with a h/o new onset a flutter, 150 bpm, that was associated with left chest and left arm pain, dx in the ER, 12/11/15, with f/u in the afib clinic today. She was laying in bed reading a romance novel when her left sided CP began. Pt states that she had an intermittent cramping sensation to her chest that radiated to her left arm.  Pt notes that she was recently placed on prednisone taper and received steriod shot  for a rash of unknown etiology to her arms and trunk and she is mid way in her taper.  Pt reports that she has been evaluated by two dermatologists for her rash. Denies any new supplements or caffeine use. She states that she is having associated symptoms of palpitations. Pt notes that she has had a flutter sensation in the past.  She denies leg swelling, diaphoresis, nausea, and any other symptoms. Denies hx of thyroid issues. 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  She did convert on her own without cardioversion after receiving 5 mg lopressor and was given rx for metoprolol She reports that she has severe extrinsic asthma and was severly symptomatic for years until she started taking Xolair injections every two weeks. With this information, I am hesitate to for her to take  BB's, prescribed in the ER for rate control and will change to CCB. She can stop plavix and with a chadvasc score of 5, and discussion of choice of anticoagulants, opts for Xarelto daily.   She returns to afib clinic 8/28 for f/u echo. She has not had any further arrhythmia. Doing well on xarelto and can stop asa that she takes on her own. EF was normal and left atrium severly dilated at 55 mm. Severely calcified annulus. She has lost 8 lbs and saw PCP recently  with normal labs.  F/u in afib clinic 12/24/16. I have seen in over one year. She is followed by Dr.Skains. She is being seen for a rash that started shortly after I stopped seeing her. She recently had Cardizem stopped and started on verapamil yesterday for the rash. She woke up this am to more itching. Her pulmonologist stopped her lung injections x one month without improvement in symptoms. She feels it is the xarelto or CCB. She saw a dermatologist without improvement in symptoms or a clear diagnosis.   F/u in the afib clinic. 06/22/17. She continues to have a rash but it does not appear to be related to drugs and she has actually been started on methotrexate by dermatology. 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    Today, she denies symptoms of palpitations, chest pain, shortness of breath, orthopnea, PND, lower extremity edema, dizziness, presyncope, syncope, or neurologic sequela. The patient is tolerating medications without difficulties and is otherwise without complaint today.   Past Medical History:  Diagnosis Date  . Chronic airway obstruction, not elsewhere classified   . Diastolic dysfunction without heart failure   . Disorder of bone and cartilage, unspecified   . Paroxysmal atrial flutter ([place])   .  TIA (transient ischemic attack)   . Unspecified asthma(493.90)    Past Surgical History:  Procedure Laterality Date  . ABDOMINAL HYSTERECTOMY      Current Outpatient Medications  Medication Sig Dispense Refill  . cholecalciferol (VITAMIN D) 1000 units tablet Take 1,000 Units by mouth daily.    . fish oil-omega-3 fatty acids 1000 MG capsule Take 2 g by mouth daily.      . Fluticasone-Salmeterol (ADVAIR) 250-50 MCG/DOSE AEPB Inhale 1 puff into the lungs 2 (two) times daily.    . folic acid (FOLVITE) 1 MG tablet TAKE 1 TABLET DAILY ON THE DAYS YOU DO NOT TAKE  METHOTREXATE.  3  . MEGARED OMEGA-3 KRILL OIL 500 MG CAPS Take 1 capsule by mouth daily.    . methotrexate (RHEUMATREX) 2.5 MG tablet TAKE 7.5MG  (3 TABLETS) ONCE A WEEK ON THE SAME DAY EVERY WEEK  3  . mometasone (ELOCON) 0.1 % cream Apply 1 application topically daily.    . Multiple Vitamin (MULTIVITAMIN) tablet Take 1 tablet by mouth daily.     Marland Kitchen omalizumab (XOLAIR) 150 MG injection Inject 150 mg into the skin.    . rivaroxaban (XARELTO) 20 MG TABS tablet TAKE 1 TABLET BY MOUTH EVERY DAY WITH SUPPER 30 tablet 6  . triamcinolone ointment (KENALOG) 0.1 % APPLY TWICE A DAY TO ITCHY, RED, SCALY AREAS UNTIL SMOOTH  3  . VENTOLIN HFA 108 (90 Base) MCG/ACT inhaler TAKE 2 PUFFS EVERY 4 HOURS AS NEEDED (RESCUE) 18 Inhaler 3   No current facility-administered medications for this encounter.     Allergies  Allergen Reactions  . Ibuprofen Hives  . Clarithromycin Other (See Comments)    "just about killed me"  . Codeine Phosphate     REACTION: nausea  . Diltiazem Rash    ? Rash from diltiazem  . Verapamil Hcl Er Rash    Social History   Socioeconomic History  . Marital status: Divorced    Spouse name: Not on file  . Number of children: Not on file  . Years of education: Not on file  . Highest education level: Not on file  Social Needs  . Financial resource strain: Not on file  . Food insecurity - worry: Not on file  . Food insecurity - inability: Not on file  . Transportation needs - medical: Not on file  . Transportation needs - non-medical: Not on file  Occupational History  . Occupation: retired  Tobacco Use  . Smoking status: Former Smoker    Years: 0.00    Last attempt to quit: 05/24/1977    Years since quitting: 40.1  . Smokeless tobacco: Never Used  Substance and Sexual Activity  . Alcohol use: No  . Drug use: No  . Sexual activity: Not on file  Other Topics Concern  . Not on file  Social History Narrative  . Not on file    Family History  Problem Relation Age of  Onset  . COPD Father   . Cancer Mother   . Kidney cancer Sister   . Lung cancer Sister   . Ovarian cancer Sister   . Liver disease Sister   . Melanoma Sister   . Breast cancer Neg Hx     ROS- All systems are reviewed and negative except as per the HPI above  Physical Exam: Vitals:   06/22/17 1033  BP: (!) 146/80  Pulse: 74  Weight: 209 lb 3.2 oz (94.9 kg)  Height: 5\' 10"  (1.778 m)    GEN-  The patient is well appearing, alert and oriented x 3 today.   Head- normocephalic, atraumatic Eyes-  Sclera clear, conjunctiva pink Ears- hearing intact Oropharynx- clear Neck- supple, no JVP Lymph- no cervical lymphadenopathy Lungs- Clear to ausculation bilaterally, normal work of breathing Heart- Regular rate and rhythm, no murmurs, rubs or gallops, PMI not laterally displaced GI- soft, NT, ND, + BS Extremities- no clubbing, cyanosis, or edema, fine rash present on arms/lower legs. MS- no significant deformity or atrophy Skin- no rash or lesion Psych- euthymic mood, full affect Neuro- strength and sensation are intact  EKG- Sinus rhythm at 74 bpm with PAC's  Epic records reviewed   Echo-Left ventricle: The cavity size was mildly dilated. Wall   thickness was increased in a pattern of mild LVH. Systolic   function was normal. The estimated ejection fraction was in the   range of 55% to 60%. Wall motion was normal; there were no   regional wall motion abnormalities. Doppler parameters are   consistent with abnormal left ventricular relaxation (grade 1   diastolic dysfunction). Doppler parameters are consistent with   high ventricular filling pressure. - Mitral valve: Severely calcified annulus. There was mild   regurgitation. Valve area by continuity equation (using LVOT   flow): 2.36 cm^2. - Left atrium: The atrium was severely dilated. - Right ventricle: The cavity size was normal. Wall thickness was   normal. Systolic function was normal. - Right atrium: The atrium was  mildly dilated. - Atrial septum: No defect or patent foramen ovale was identified   by color flow Doppler. - Tricuspid valve: There was trivial regurgitation. - Pulmonary arteries: Systolic pressure was within the normal   range. PA peak pressure: 24 mm Hg (S).  Assessment and Plan: 1. Rash Appears not to be drug related Per dermatology  2.Typical atrial flutter x one episode in 2017 Quiet   If typical flutter returns off CCB, may refer for flutter ablation  3. Chadsvasc score of 5 Off plavix/asa for remote blood clot to left eye(2013) Continue xarelto 20 mg a day    F/u in one week  [name] C. Hebe Merriwether, [facility] [address] [place], [place] [address] [phone]

## 2017-06-22 NOTE — Telephone Encounter (Deleted)
Error

## 2017-06-22 NOTE — Progress Notes (Signed)
Patient ID: Laura Mendez, female    DOB: 25-Mar-1941, 77 y.o.   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 Fib ---------------------------------------------------------------. 12/20/16- 77 year old female former smoker followed for severe asthma/COPD/Xolair, complicated by allergic rhinitis, paroxysmal A. Fib, rash FOLLOWS FOR: Pt continues to have rash on her body and states it started after starting Xarelto.  Has not been on Xolair either and starting to have SOB with wheezing again. Labs 11/23/16- allergy profile total IgE 409 on Xolair, elevated specific IgE for dust mite and cockroach. EOS 3,300, Nl ANA and alpha-gal Derm Path report reviewed- Spongiotic/ eczema process- does not describe it as urticaria. Chlor-[place] made her too drowsy. Using Benadryl ointment but still having pruritic rash especially legs and arms. She denies being outdoors much, reducing the chance this is a photosensitivity rash. No improvement off Xolair for 4 weeks. Without Xolair she feels tighter in chest and more short of breath.  06/22/17- 77 year old female former smoker followed for severe asthma/COPD/Xolair, complicated by allergic rhinitis, paroxysmal A. Fib, rash ----Asthma: Pt continues to get Xolair injections and patient is doing well overall. Pt is starting to have issues with allergies as well. Ventolin HFA, Xolair, methotrexate, Advair 250,       Changing now from Xolair to [employer] with pruritic rash now over one year-multiple [facility] evaluations including [facility], with biopsies.  It did not change with 1 month off of Xolair and has not responded so far to methotrexate.  Has been on Xolair now for 7 years with excellent control.  Chest symptoms began to exacerbate during the month off of Xolair.  We have talked about trying to switch her to another product, like Nucala, as another way of seeing if  the Xolair has anything to do with her rash. Auditory exacerbations.  Going to the gym 3 or 4 times a week for stationary bicycle and treadmill.  Little routine cough.  Review of Systems-see HPI   + = pos  Constitutional:   No-   weight loss, night sweats, fevers, chills, fatigue, lassitude. HEENT:   No-  headaches, difficulty swallowing, tooth/dental problems, sore throat,       No-  sneezing, itching, ear ache,  nasal congestion, +post nasal drip,  CV:  No-   chest pain, orthopnea, PND, swelling in lower extremities, anasarca, dizziness, palpitations Resp:  +Some shortness of breath with exertion or at rest- unchanged             No-   productive cough,  No non-productive cough,  No-  coughing up of blood.              No-   change in color of mucus.  No- wheezing.   Skin:  +rash or lesions. GI:  No-   heartburn, indigestion, abdominal pain, nausea, vomiting, GU:  MS:  +   joint pain or swelling.   Neuro- nothing unusual  Psych:  No- change in mood or affect. No depression or anxiety.  No memory loss  Objective:     General- Alert, Oriented, Affect-appropriate, Distress- none acute, Pleasant, + Overweight Skin- + Faint blotchy rash on arms and ankles Lymphadenopathy- none Head- atraumatic            Eyes- Gross vision intact, PERRLA, conjunctivae clear secretions            Ears- Hearing, canals normal  Nose- Mild turbinate edema, No-Septal dev, mucus, polyps, erosion, perforation             Throat- Mallampati II , mucosa -not red, drainage- none, tonsils- atrophic Neck- flexible , trachea midline, no stridor , thyroid nl, carotid no bruit Chest - symmetrical excursion , unlabored           Heart/CV- RRR +, no murmur , no gallop  , no rub, nl s1 s2                           - JVD- none , edema- none, stasis changes- none, varices- none           Lung- +clear to P&A/ distant/unlabored, wheeze- none, cough-none, dullness-none, rub- none           Chest wall-  Abd-  Br/  Gen/ Rectal- Not done, not indicated Extrem- cyanosis- none, clubbing, none, atrophy- none, strength- nl Neuro- grossly intact to observation

## 2017-06-23 NOTE — Telephone Encounter (Signed)
#   Vials:4 Arrival Date:06/22/17 Lot #:[id number] Exp Date:04/2018 Got busy and forgot to document.

## 2017-06-24 ENCOUNTER — Telehealth: Payer: Self-pay | Admitting: Internal Medicine

## 2017-06-24 LAB — QUANTIFERON-TB GOLD PLUS
Mitogen-NIL: >10 IU/mL
NIL: 0.09 IU/mL
QuantiFERON-TB Gold Plus: NEGATIVE
TB1-NIL: 0.02 [IU]/mL
TB2-NIL: 0.01 IU/mL

## 2017-06-24 NOTE — Telephone Encounter (Signed)
Will route to TS 

## 2017-06-28 NOTE — Telephone Encounter (Signed)
Tammy please advise on update. Thanks. 

## 2017-06-28 NOTE — Telephone Encounter (Signed)
We have new enrollment forms for nucala. With the old forms, pt. Signed the front. The new forms pt. Signs the back, I didn't realize pt hadn't signed it till I had already sent it in. Called [facility] to let them know pt is coming in this Fri. to sign the enrollment form, then I will fax it to [employer]. Nothing further needed.

## 2017-06-30 ENCOUNTER — Encounter: Payer: Self-pay | Admitting: *Deleted

## 2017-06-30 ENCOUNTER — Telehealth: Payer: Self-pay | Admitting: Pulmonary Disease

## 2017-06-30 NOTE — Telephone Encounter (Signed)
Called and spoke with patient, advised her of results. Letter typed saying TB was negative. printed letter sent in mail. Nothing further needed.

## 2017-07-01 ENCOUNTER — Ambulatory Visit (INDEPENDENT_AMBULATORY_CARE_PROVIDER_SITE_OTHER): Payer: Medicare Other

## 2017-07-01 DIAGNOSIS — J4540 Moderate persistent asthma, uncomplicated: Secondary | ICD-10-CM | POA: Diagnosis not present

## 2017-07-04 MED ORDER — OMALIZUMAB 150 MG ~~LOC~~ SOLR
300.0000 mg | SUBCUTANEOUS | Status: DC
  Administered 2017-07-01: 300 mg via SUBCUTANEOUS

## 2017-07-04 NOTE — Progress Notes (Signed)
Documentation of medication administration and charges of Xolair have been completed by Kinsly Hild, CMA based on the Xolair documentation sheet completed by Tammy Scott.  

## 2017-07-08 ENCOUNTER — Telehealth: Payer: Self-pay | Admitting: Internal Medicine

## 2017-07-08 NOTE — Telephone Encounter (Signed)
Laura Mendez please advise. Thanks

## 2017-07-13 NOTE — Telephone Encounter (Signed)
Just heard from pt's p/a, it was denied. Work in progress.

## 2017-07-15 ENCOUNTER — Ambulatory Visit (INDEPENDENT_AMBULATORY_CARE_PROVIDER_SITE_OTHER): Payer: Medicare Other

## 2017-07-15 DIAGNOSIS — J449 Chronic obstructive pulmonary disease, unspecified: Secondary | ICD-10-CM | POA: Diagnosis not present

## 2017-07-18 MED ORDER — OMALIZUMAB 150 MG ~~LOC~~ SOLR
300.0000 mg | Freq: Once | SUBCUTANEOUS | Status: AC
  Administered 2017-07-15: 300 mg via SUBCUTANEOUS

## 2017-07-21 NOTE — Telephone Encounter (Signed)
Tammy, please advise on status of denial. Thanks.

## 2017-07-25 DIAGNOSIS — H349 Unspecified retinal vascular occlusion: Secondary | ICD-10-CM | POA: Diagnosis not present

## 2017-07-25 DIAGNOSIS — H25813 Combined forms of age-related cataract, bilateral: Secondary | ICD-10-CM | POA: Diagnosis not present

## 2017-07-25 DIAGNOSIS — H52203 Unspecified astigmatism, bilateral: Secondary | ICD-10-CM | POA: Diagnosis not present

## 2017-07-25 DIAGNOSIS — D23121 Other benign neoplasm of skin of left upper eyelid, including canthus: Secondary | ICD-10-CM | POA: Diagnosis not present

## 2017-07-26 NOTE — Telephone Encounter (Signed)
Tammy please advise on this, thanks. 

## 2017-07-26 NOTE — Telephone Encounter (Signed)
Did an appeal, it was approved.

## 2017-07-27 ENCOUNTER — Telehealth: Payer: Self-pay | Admitting: Internal Medicine

## 2017-07-27 NOTE — Telephone Encounter (Signed)
Prefilled Syringe: #150mg 4  #75mg 0 Ordered Date: 07/27/2017 Shipping Date: 07/27/2017 

## 2017-07-28 ENCOUNTER — Telehealth: Payer: Self-pay | Admitting: *Deleted

## 2017-07-28 NOTE — Telephone Encounter (Signed)
Prefilled Syringes: # 150mg  4  #75mg   n/a Arrival Date:07/28/2017 Lot #: 150mg  [id number]      75mg  n/a Exp Date: 150mg  04/2018   75mg   n/a

## 2017-07-28 NOTE — Telephone Encounter (Deleted)
Prefilled Syringes: # 150mg  4  #75mg  n/a Arrival Date:07/28/2017 Lot #: 150mg  [id number]      75mg  n/a Exp Date: 150mg  04/2018   75mg   n/a

## 2017-07-28 NOTE — Telephone Encounter (Signed)
Error

## 2017-07-29 ENCOUNTER — Ambulatory Visit (INDEPENDENT_AMBULATORY_CARE_PROVIDER_SITE_OTHER): Payer: Medicare Other

## 2017-07-29 DIAGNOSIS — J449 Chronic obstructive pulmonary disease, unspecified: Secondary | ICD-10-CM | POA: Diagnosis not present

## 2017-08-01 DIAGNOSIS — D23121 Other benign neoplasm of skin of left upper eyelid, including canthus: Secondary | ICD-10-CM | POA: Diagnosis not present

## 2017-08-01 NOTE — Telephone Encounter (Signed)
Pt. Will be able to use PAN that is already in place for xolair. She has grant money left. Calling to order nucala now.

## 2017-08-01 NOTE — Telephone Encounter (Signed)
This my 4th attempt to order pt's nucala. I told the prompter I wanted office del., it transferred me to p/as, I also punched in the corresponding #, p/as again. They keep connecting me to the wrong# when I

## 2017-08-02 MED ORDER — OMALIZUMAB 150 MG ~~LOC~~ SOLR
300.0000 mg | Freq: Once | SUBCUTANEOUS | Status: AC
  Administered 2017-07-29: 300 mg via SUBCUTANEOUS

## 2017-08-03 NOTE — Telephone Encounter (Signed)
Spoke with [facility]-attempted to setup shipment for Nucala. However, patient has a high co-pay and they must get approval from patient. Pt should be able to give PAN information as well to pharmacy to help with co-pay. One Carley Hammed has spoken with patient they will contact our office to schedule shipment.

## 2017-08-05 ENCOUNTER — Telehealth: Payer: Self-pay | Admitting: *Deleted

## 2017-08-05 NOTE — Telephone Encounter (Signed)
  1 Vial Order Date: 3.15.19 Shipping Date: 3.18.19

## 2017-08-08 ENCOUNTER — Telehealth: Payer: Self-pay | Admitting: *Deleted

## 2017-08-08 NOTE — Telephone Encounter (Signed)
Called patient unable to reach left message. Will route to TS for follow up.

## 2017-08-08 NOTE — Telephone Encounter (Signed)
Called patient to see if she has spoken with [facility] [place], unable to reach patient left message to give [place] a call back. Will route to TS for follow up.

## 2017-08-08 NOTE — Telephone Encounter (Signed)
Arrival Date: 08/08/2017 # of Vials: 1 Lot #: [id number] Expiration Date: 07/2020

## 2017-08-08 NOTE — Telephone Encounter (Signed)
Called pt. To let her know her nucala came in. Unless she has a question for me I told her I would see her at her 1st nucala appt. I will call her back because I need to make sure she has an Epi-pen and she will have to wait for 2 hrs.Laura Mendez

## 2017-08-08 NOTE — Telephone Encounter (Signed)
I spoke with Kathlee Nations concerning Laura Mendez, she is still going to have over $900 co-pay with PAN. We are going to buy & bill. Ordered Nucala Fri. It will be in toaday. I will call pt. And make her aware. Called pt. Lmom. Nothing further needed.

## 2017-08-10 NOTE — Telephone Encounter (Signed)
Called pt. Again to make sure she has an up to date Epi-pen and that she can wait 2 hr. This coming Fri. For her nucala shot. Please let me know when she calls, we keep playing ph. Tag.

## 2017-08-12 ENCOUNTER — Telehealth: Payer: Self-pay | Admitting: Internal Medicine

## 2017-08-12 ENCOUNTER — Ambulatory Visit (INDEPENDENT_AMBULATORY_CARE_PROVIDER_SITE_OTHER): Payer: Medicare Other

## 2017-08-12 DIAGNOSIS — J449 Chronic obstructive pulmonary disease, unspecified: Secondary | ICD-10-CM

## 2017-08-12 MED ORDER — EPINEPHRINE 0.3 MG/0.3ML IJ SOAJ: 0.3000 mg | Freq: Once | INTRAMUSCULAR | 11 refills | Status: AC

## 2017-08-12 NOTE — Telephone Encounter (Signed)
Rx sent 

## 2017-08-15 ENCOUNTER — Other Ambulatory Visit: Payer: Self-pay | Admitting: Internal Medicine

## 2017-08-15 ENCOUNTER — Encounter: Admit: 2017-08-15 | Discharge: 2017-08-16 | Payer: MEDICARE | Attending: Dermatology | Primary: Dermatology

## 2017-08-15 DIAGNOSIS — L309 Dermatitis, unspecified: Principal | ICD-10-CM

## 2017-08-15 DIAGNOSIS — L559 Sunburn, unspecified: Secondary | ICD-10-CM

## 2017-08-15 MED ORDER — MEPOLIZUMAB 100 MG ~~LOC~~ SOLR
100.0000 mg | Freq: Once | SUBCUTANEOUS | Status: AC
  Administered 2017-08-12: 100 mg via SUBCUTANEOUS

## 2017-08-16 MED ORDER — EPINEPHRINE 0.3 MG/0.3ML IJ SOAJ: 0.3000 mg | Freq: Once | INTRAMUSCULAR | 11 refills | Status: AC

## 2017-08-16 NOTE — Telephone Encounter (Signed)
Tammy please advise if you have spoken with the pt. Thanks. 

## 2017-08-16 NOTE — Telephone Encounter (Signed)
Pt. Took a dose of 300 mg xolair, leaving a dose. Pt's nucala came in after that shot. I Hadn't ordered xolair from [facility] yet, per Joellen Jersey give to [employer] (300mg  q 4 wks.) Will put in received [employer] text. Nothing further needed.

## 2017-08-16 NOTE — Telephone Encounter (Signed)
Done, nothing further needed.

## 2017-08-16 NOTE — Addendum Note (Signed)
Addended by: Alroy Bailiff B on: 08/16/2017 04:04 PM   Modules accepted: Orders

## 2017-08-26 ENCOUNTER — Ambulatory Visit: Payer: Medicare Other

## 2017-09-05 ENCOUNTER — Ambulatory Visit: Admit: 2017-09-05 | Discharge: 2017-09-06 | Payer: MEDICARE

## 2017-09-05 DIAGNOSIS — R21 Rash and other nonspecific skin eruption: Principal | ICD-10-CM

## 2017-09-05 MED ORDER — CLOBETASOL 0.05 % TOPICAL OINTMENT
Freq: Two times a day (BID) | TOPICAL | 1 refills | 0.00000 days | Status: CP
Start: 2017-09-05 — End: 2017-09-05

## 2017-09-05 MED ORDER — CLOBETASOL 0.05 % TOPICAL OINTMENT: g | Freq: Two times a day (BID) | 4 refills | 0 days | Status: AC

## 2017-09-05 MED ORDER — CLOBETASOL 0.05 % TOPICAL OINTMENT: g | Freq: Two times a day (BID) | 1 refills | 0 days | Status: AC

## 2017-09-09 ENCOUNTER — Ambulatory Visit: Payer: Medicare Other

## 2017-09-12 ENCOUNTER — Ambulatory Visit: Payer: Medicare Other

## 2017-09-14 ENCOUNTER — Telehealth: Payer: Self-pay | Admitting: Internal Medicine

## 2017-09-14 NOTE — Telephone Encounter (Signed)
1 Vial Order Date: 4.24.19 Shipping Date: 4.25.19

## 2017-09-15 ENCOUNTER — Other Ambulatory Visit: Payer: Self-pay

## 2017-09-15 ENCOUNTER — Ambulatory Visit: Payer: Medicare Other | Admitting: Physician Assistant

## 2017-09-15 ENCOUNTER — Encounter: Payer: Self-pay | Admitting: Physician Assistant

## 2017-09-15 VITALS — BP 122/70 | HR 75 | Temp 97.6°F | Resp 18 | Ht 70.0 in | Wt 212.0 lb

## 2017-09-15 DIAGNOSIS — S51012A Laceration without foreign body of left elbow, initial encounter: Secondary | ICD-10-CM | POA: Diagnosis not present

## 2017-09-15 NOTE — Telephone Encounter (Signed)
1 vial Arrival Date:09/15/17 Lot #: [id number] Exp Date:09/2020

## 2017-09-15 NOTE — Patient Instructions (Addendum)
  Please leave the wound alone.  Come back in two days.    IF you received an x-ray today, you will receive an invoice from [facility]. Please contact [facility] at [phone] with questions or concerns regarding your invoice.   IF you received labwork today, you will receive an invoice from [facility] [place]. Please contact [facility] at [phone] with questions or concerns regarding your invoice.   Our billing staff will not be able to assist you with questions regarding bills from these companies.  You will be contacted with the lab results as soon as they are available. The fastest way to get your results is to activate your My Chart account. Instructions are located on the last page of this paperwork. If you have not heard from [place] regarding the results in 2 weeks, please contact this office.

## 2017-09-15 NOTE — Progress Notes (Signed)
    09/15/2017 11:04 AM   DOB: Jul 08, 1940 / MRN: [id number]  SUBJECTIVE:  Laura Mendez is a 77 y.o. female presenting for skin tear.  Happened at the dollar store yesterday.  She denies exquisite pain about the wound.   Immunization History  Administered Date(s) Administered  . H1N1 04/24/2008  . Influenza Split 02/03/2011, 02/18/2012, 02/09/2013, 03/17/2015  . Influenza Whole 02/22/2007, 02/16/2008, 02/21/2009, 01/23/2010  . Influenza, High Dose Seasonal PF 03/05/2016, 03/09/2017  . Influenza,inj,Quad PF,6+ Mos 02/09/2013, 02/22/2014  . Influenza-Unspecified 02/03/2011, 02/09/2013, 02/22/2014  . Pneumococcal Conjugate-13 01/08/2015  . Pneumococcal Polysaccharide-23 05/25/2003, 02/16/2017  . Td 05/24/2004  . Tdap 06/24/2015  . Zoster 08/22/2011     She is allergic to ibuprofen; clarithromycin; codeine phosphate; diltiazem; and verapamil hcl er.   She  has a past medical history of Chronic airway obstruction, not elsewhere classified, Diastolic dysfunction without heart failure, Disorder of bone and cartilage, unspecified, Paroxysmal atrial flutter ([place]), TIA (transient ischemic attack), and Unspecified asthma(493.90).    She  reports that she quit smoking about 40 years ago. She quit after 0.00 years of use. She has never used smokeless tobacco. She reports that she does not drink alcohol or use drugs. She  has no sexual activity history on file. The patient  has a past surgical history that includes Abdominal hysterectomy.  Her family history includes COPD in her father; Cancer in her mother; Kidney cancer in her sister; Liver disease in her sister; Lung cancer in her sister; Melanoma in her sister; Ovarian cancer in her sister.  Review of Systems  Constitutional: Negative for chills, diaphoresis and fever.  Gastrointestinal: Negative for nausea.  Skin: Negative for rash.  Neurological: Negative for dizziness.    The problem list and medications were reviewed and updated by  myself where necessary and exist elsewhere in the encounter.   OBJECTIVE:  BP 122/70   Pulse 75   Temp 97.6 F (36.4 C) (Oral)   Resp 18   Ht 5\' 10"  (1.778 m)   Wt 212 lb (96.2 kg)   SpO2 95%   BMI 30.42 kg/m   Physical Exam  Constitutional: She is oriented to person, place, and time. She appears well-nourished. No distress.  Eyes: Pupils are equal, round, and reactive to light. EOM are normal.  Cardiovascular: Normal rate.  Pulmonary/Chest: Effort normal.  Abdominal: She exhibits no distension.  Neurological: She is alert and oriented to person, place, and time. No cranial nerve deficit. Gait normal.  Skin: Skin is dry. She is not diaphoretic.  Psychiatric: She has a normal mood and affect.  Vitals reviewed.      No results found for this or any previous visit (from the past 72 hour(s)).  No results found.  ASSESSMENT AND PLAN:  Laura Mendez was seen today for arm injury.  Diagnoses and all orders for this visit:  Skin tear of left elbow without complication, initial encounter: Dress here.  FU in two days.     The patient is advised to call or return to clinic if she does not see an improvement in symptoms, or to seek the care of the closest emergency department if she worsens with the above plan.   Philis Fendt, MHS, PA-C [facility] 09/15/2017 11:04 AM

## 2017-09-16 ENCOUNTER — Ambulatory Visit (INDEPENDENT_AMBULATORY_CARE_PROVIDER_SITE_OTHER): Payer: Medicare Other

## 2017-09-16 DIAGNOSIS — J4540 Moderate persistent asthma, uncomplicated: Secondary | ICD-10-CM

## 2017-09-17 ENCOUNTER — Ambulatory Visit: Payer: Medicare Other | Admitting: Emergency Medicine

## 2017-09-17 ENCOUNTER — Other Ambulatory Visit: Payer: Self-pay

## 2017-09-17 ENCOUNTER — Encounter: Payer: Self-pay | Admitting: Emergency Medicine

## 2017-09-17 VITALS — BP 116/62 | HR 77 | Temp 97.9°F | Resp 16 | Ht 70.0 in | Wt 213.0 lb

## 2017-09-17 DIAGNOSIS — S51011D Laceration without foreign body of right elbow, subsequent encounter: Secondary | ICD-10-CM

## 2017-09-17 NOTE — Patient Instructions (Addendum)
     IF you received an x-ray today, you will receive an invoice from [facility]. Please contact [facility] at [phone] with questions or concerns regarding your invoice.   IF you received labwork today, you will receive an invoice from [place]. Please contact [facility] at [phone] with questions or concerns regarding your invoice.   Our billing staff will not be able to assist you with questions regarding bills from these companies.  You will be contacted with the lab results as soon as they are available. The fastest way to get your results is to activate your My Chart account. Instructions are located on the last page of this paperwork. If you have not heard from [place] regarding the results in 2 weeks, please contact this office.     Skin Tear Care A skin tear is a wound in which the top layer of skin peels off. To repair the skin, your doctor may use:  Tape.  Skin adhesive strips.  A bandage (dressing) may also be placed over the tape or skin adhesive strips. How to care for your skin tear  Clean the wound as told by your doctor. You may be told to keep the wound dry for the first few days. If you are told to clean the wound: ? Wash the wound with mild soap and water or a salt-water (saline) solution. ? Rinse the wound with water to remove all soap. ? Do not rub the wound dry. Let the wound air dry.  Change any dressings as told by your doctor. This includes changing the dressing if it gets wet, gets dirty, or starts to smell bad.  Do not scratch or pick at the wound.  Protect the injured area until it has healed.  Check your wound every day for signs of infection. Check for: ? More redness, swelling, or pain. ? More fluid or blood. ? Warmth. ? Pus or a bad smell. Medicines   Take over-the-counter and prescription medicines only as told by your doctor.  If you were prescribed an antibiotic medicine, take or apply it as told by your doctor. Do  not stop using the antibiotic even if your condition gets better. General instructions  Keep the bandage dry as told by your doctor.  Do not take baths, swim, or do anything that puts your wound underwater until your doctor says it is okay.  Keep all follow-up visits as told by your doctor. This is important. Contact a doctor if:  You have more redness, swelling, or pain around your wound.  You have more fluid or blood coming from your wound.  Your wound feels warm to the touch.  You have pus or a bad smell coming from your wound. Get help right away if:  You have a red streak that goes away from the skin tear.  You have a fever and chills and your symptoms suddenly get worse. This information is not intended to replace advice given to you by your health care provider. Make sure you discuss any questions you have with your health care provider. Document Released: 02/17/2008 Document Revised: 01/04/2016 Document Reviewed: 03/31/2015 Elsevier Interactive Patient Education  [employer].

## 2017-09-17 NOTE — Progress Notes (Signed)
Laura Mendez 77 y.o.   Chief Complaint  Patient presents with  . Arm    right, recheck x 2 days    HISTORY OF PRESENT ILLNESS: This is a 77 y.o. female here for wound recheck on her right arm.  Sustained a skin tear 3 days ago.  Healing well with no signs of infection.  Has no complaints today.  HPI   Prior to Admission medications   Medication Sig Start Date End Date Taking? Authorizing Provider  ADVAIR DISKUS 250-50 MCG/DOSE AEPB INHALE 1 PUFF TWICE DAILY, RINSE AFTER USE 08/15/17  Yes Young, Clinton D, MD  cholecalciferol (VITAMIN D) 1000 units tablet Take 1,000 Units by mouth daily.   Yes [provider]  fish oil-omega-3 fatty acids 1000 MG capsule Take 2 g by mouth daily.     Yes [provider]  MEGARED OMEGA-3 KRILL OIL 500 MG CAPS Take 1 capsule by mouth daily.   Yes [provider]  mometasone (ELOCON) 0.1 % cream Apply 1 application topically daily.   Yes [provider]  Multiple Vitamin (MULTIVITAMIN) tablet Take 1 tablet by mouth daily.    Yes [provider]  rivaroxaban (XARELTO) 20 MG TABS tablet TAKE 1 TABLET BY MOUTH EVERY DAY WITH SUPPER 03/18/17  Yes Jerline Pain, MD  rivaroxaban (XARELTO) 20 MG TABS tablet Take 20 mg by mouth daily.   Yes [provider]  triamcinolone ointment (KENALOG) 0.1 % APPLY TWICE A DAY TO ITCHY, RED, SCALY AREAS UNTIL SMOOTH 04/07/17  Yes [provider]  VENTOLIN HFA 108 (90 Base) MCG/ACT inhaler TAKE 2 PUFFS EVERY 4 HOURS AS NEEDED (RESCUE) 10/05/16  Yes Young, Clinton D, MD  folic acid (FOLVITE) 1 MG tablet TAKE 1 TABLET DAILY ON THE DAYS YOU DO NOT TAKE METHOTREXATE. 04/25/17   [provider]  methotrexate (RHEUMATREX) 2.5 MG tablet TAKE 7.5MG  (3 TABLETS) ONCE A WEEK ON THE SAME DAY EVERY WEEK 06/09/17   [provider]    Allergies  Allergen Reactions  . Ibuprofen Hives  . Clarithromycin Other (See Comments)    "just about killed me"  . Codeine  Phosphate     REACTION: nausea  . Diltiazem Rash    ? Rash from diltiazem  . Verapamil Hcl Er Rash    Patient Active Problem List   Diagnosis Date Noted  . Diastolic dysfunction 46/27/0350  . Rash and nonspecific skin eruption 04/01/2016  . Paroxysmal atrial flutter ([place]) 01/13/2016  . Routine general medical examination at a health care facility 01/13/2016  . Retinal vein occlusion 06/16/2015  . Seasonal and perennial allergic rhinitis 08/06/2011  . Asthma with COPD ([place]) 08/04/2007  . Osteopenia 03/31/2007    Past Medical History:  Diagnosis Date  . Chronic airway obstruction, not elsewhere classified   . Diastolic dysfunction without heart failure   . Disorder of bone and cartilage, unspecified   . Paroxysmal atrial flutter ([place])   . TIA (transient ischemic attack)   . Unspecified asthma(493.90)     Past Surgical History:  Procedure Laterality Date  . ABDOMINAL HYSTERECTOMY      Social History   Socioeconomic History  . Marital status: Divorced    Spouse name: Not on file  . Number of children: Not on file  . Years of education: Not on file  . Highest education level: Not on file  Occupational History  . Occupation: retired  Scientific laboratory technician  . Financial resource strain: Not on file  . Food insecurity:  Worry: Not on file    Inability: Not on file  . Transportation needs:    Medical: Not on file    Non-medical: Not on file  Tobacco Use  . Smoking status: Former Smoker    Years: 0.00    Last attempt to quit: 05/24/1977    Years since quitting: 40.3  . Smokeless tobacco: Never Used  Substance and Sexual Activity  . Alcohol use: No  . Drug use: No  . Sexual activity: Not on file  Lifestyle  . Physical activity:    Days per week: Not on file    Minutes per session: Not on file  . Stress: Not on file  Relationships  . Social connections:    Talks on phone: Not on file    Gets together: Not on file    Attends religious service: Not on file    Active  member of club or organization: Not on file    Attends meetings of clubs or organizations: Not on file    Relationship status: Not on file  . Intimate partner violence:    Fear of current or ex partner: Not on file    Emotionally abused: Not on file    Physically abused: Not on file    Forced sexual activity: Not on file  Other Topics Concern  . Not on file  Social History Narrative  . Not on file    Family History  Problem Relation Age of Onset  . COPD Father   . Cancer Mother   . Kidney cancer Sister   . Lung cancer Sister   . Ovarian cancer Sister   . Liver disease Sister   . Melanoma Sister   . Breast cancer Neg Hx      Review of Systems  Constitutional: Negative.  Negative for chills and fever.  HENT: Negative for sore throat.   Respiratory: Negative for shortness of breath.   Cardiovascular: Negative for chest pain.  Gastrointestinal: Negative for nausea and vomiting.  Neurological: Negative for dizziness and headaches.  All other systems reviewed and are negative.   Vitals:   09/17/17 0918  BP: 116/62  Pulse: 77  Resp: 16  Temp: 97.9 F (36.6 C)  SpO2: 94%    Physical Exam  Constitutional: She is oriented to person, place, and time. She appears well-developed and well-nourished.  HENT:  Head: Normocephalic and atraumatic.  Eyes: Pupils are equal, round, and reactive to light.  Neck: Normal range of motion.  Cardiovascular: Normal rate.  Pulmonary/Chest: Effort normal.  Neurological: She is alert and oriented to person, place, and time.  Skin: Capillary refill takes less than 2 seconds.  See picture below.  Psychiatric: She has a normal mood and affect.  Vitals reviewed.   .   ASSESSMENT & PLAN: Laura Mendez was seen today for arm.  Diagnoses and all orders for this visit:  Skin tear of right elbow without complication, subsequent encounter    Patient Instructions       IF you received an x-ray today, you will receive an invoice from  [facility]. Please contact [facility] at [phone] with questions or concerns regarding your invoice.   IF you received labwork today, you will receive an invoice from [place]. Please contact [facility] at [phone] with questions or concerns regarding your invoice.   Our billing staff will not be able to assist you with questions regarding bills from these companies.  You will be contacted with the lab results as soon as they are  available. The fastest way to get your results is to activate your My Chart account. Instructions are located on the last page of this paperwork. If you have not heard from [place] regarding the results in 2 weeks, please contact this office.     Skin Tear Care A skin tear is a wound in which the top layer of skin peels off. To repair the skin, your doctor may use:  Tape.  Skin adhesive strips.  A bandage (dressing) may also be placed over the tape or skin adhesive strips. How to care for your skin tear  Clean the wound as told by your doctor. You may be told to keep the wound dry for the first few days. If you are told to clean the wound: ? Wash the wound with mild soap and water or a salt-water (saline) solution. ? Rinse the wound with water to remove all soap. ? Do not rub the wound dry. Let the wound air dry.  Change any dressings as told by your doctor. This includes changing the dressing if it gets wet, gets dirty, or starts to smell bad.  Do not scratch or pick at the wound.  Protect the injured area until it has healed.  Check your wound every day for signs of infection. Check for: ? More redness, swelling, or pain. ? More fluid or blood. ? Warmth. ? Pus or a bad smell. Medicines   Take over-the-counter and prescription medicines only as told by your doctor.  If you were prescribed an antibiotic medicine, take or apply it as told by your doctor. Do not stop using the antibiotic even if your condition gets better. General  instructions  Keep the bandage dry as told by your doctor.  Do not take baths, swim, or do anything that puts your wound underwater until your doctor says it is okay.  Keep all follow-up visits as told by your doctor. This is important. Contact a doctor if:  You have more redness, swelling, or pain around your wound.  You have more fluid or blood coming from your wound.  Your wound feels warm to the touch.  You have pus or a bad smell coming from your wound. Get help right away if:  You have a red streak that goes away from the skin tear.  You have a fever and chills and your symptoms suddenly get worse. This information is not intended to replace advice given to you by your health care provider. Make sure you discuss any questions you have with your health care provider. Document Released: 02/17/2008 Document Revised: 01/04/2016 Document Reviewed: 03/31/2015 Elsevier Interactive Patient Education  2018 [employer].      Agustina Caroli, MD [facility]

## 2017-09-19 MED ORDER — MEPOLIZUMAB 100 MG ~~LOC~~ SOLR
100.0000 mg | SUBCUTANEOUS | Status: DC
  Administered 2017-09-16: 100 mg via SUBCUTANEOUS

## 2017-09-19 NOTE — Progress Notes (Signed)
Nucala injection documentation and charges entered by Maury Dus, RMA, based on injection sheet filled out by Alroy Bailiff during preparation and administration. This documentation process is due to office requirements.

## 2017-09-23 ENCOUNTER — Encounter: Payer: Self-pay | Admitting: Physician Assistant

## 2017-09-23 ENCOUNTER — Ambulatory Visit: Payer: Medicare Other | Admitting: Physician Assistant

## 2017-09-23 VITALS — BP 116/73 | HR 74 | Temp 98.0°F | Resp 17 | Ht 70.0 in | Wt 212.0 lb

## 2017-09-23 DIAGNOSIS — S51011D Laceration without foreign body of right elbow, subsequent encounter: Secondary | ICD-10-CM | POA: Diagnosis not present

## 2017-09-23 NOTE — Patient Instructions (Addendum)
  Stay the course.    IF you received an x-ray today, you will receive an invoice from [facility]. Please contact [facility] at [phone] with questions or concerns regarding your invoice.   IF you received labwork today, you will receive an invoice from [place]. Please contact [facility] at [phone] with questions or concerns regarding your invoice.   Our billing staff will not be able to assist you with questions regarding bills from these companies.  You will be contacted with the lab results as soon as they are available. The fastest way to get your results is to activate your My Chart account. Instructions are located on the last page of this paperwork. If you have not heard from [place] regarding the results in 2 weeks, please contact this office.

## 2017-09-23 NOTE — Progress Notes (Signed)
    09/23/2017 9:08 AM   DOB: 02-09-1941 / MRN: [id number]  SUBJECTIVE:  Laura Mendez is a 77 y.o. female presenting for recheck of skin tear.  She is doing well. Denies weakness, fever, arm pain.  She had a reaction to adhesive about the wound which made her itch terribly.   She is allergic to ibuprofen; clarithromycin; codeine phosphate; adhesive [tape]; diltiazem; and verapamil hcl er.   She  has a past medical history of Chronic airway obstruction, not elsewhere classified, Diastolic dysfunction without heart failure, Disorder of bone and cartilage, unspecified, Paroxysmal atrial flutter ([place]), TIA (transient ischemic attack), and Unspecified asthma(493.90).    She  reports that she quit smoking about 40 years ago. She quit after 0.00 years of use. She has never used smokeless tobacco. She reports that she does not drink alcohol or use drugs. She  has no sexual activity history on file. The patient  has a past surgical history that includes Abdominal hysterectomy.  Her family history includes COPD in her father; Cancer in her mother; Kidney cancer in her sister; Liver disease in her sister; Lung cancer in her sister; Melanoma in her sister; Ovarian cancer in her sister.  Review of Systems  Constitutional: Negative for chills, diaphoresis and fever.  Respiratory: Negative for cough, hemoptysis, sputum production, shortness of breath and wheezing.   Cardiovascular: Negative for chest pain, orthopnea and leg swelling.  Gastrointestinal: Negative for nausea.  Skin: Negative for rash.  Neurological: Negative for dizziness.    The problem list and medications were reviewed and updated by myself where necessary and exist elsewhere in the encounter.   OBJECTIVE:  BP 116/73   Pulse 74   Temp 98 F (36.7 C) (Oral)   Resp 17   Ht 5\' 10"  (1.778 m)   Wt 212 lb (96.2 kg)   SpO2 98%   BMI 30.42 kg/m   Physical Exam  Constitutional: She is oriented to person, place, and time. She appears  well-developed.  Eyes: Pupils are equal, round, and reactive to light. EOM are normal.  Cardiovascular: Normal rate.  Pulmonary/Chest: Effort normal.  Abdominal: She exhibits no distension.  Musculoskeletal: Normal range of motion.  Neurological: She is alert and oriented to person, place, and time. No cranial nerve deficit.  Skin: Skin is warm and dry. She is not diaphoretic.  Psychiatric: She has a normal mood and affect.  Vitals reviewed.      No results found for this or any previous visit (from the past 72 hour(s)).  No results found.  ASSESSMENT AND PLAN:  Sole was seen today for follow-up.  Diagnoses and all orders for this visit:  Skin tear of right elbow without complication, subsequent encounter: The wound is done exceptionally well.  Advised to come back as needed.    The patient is advised to call or return to clinic if she does not see an improvement in symptoms, or to seek the care of the closest emergency department if she worsens with the above plan.   Philis Fendt, MHS, PA-C [facility] 09/23/2017 9:08 AM

## 2017-09-29 DIAGNOSIS — H25041 Posterior subcapsular polar age-related cataract, right eye: Secondary | ICD-10-CM | POA: Diagnosis not present

## 2017-09-29 DIAGNOSIS — H25811 Combined forms of age-related cataract, right eye: Secondary | ICD-10-CM | POA: Diagnosis not present

## 2017-09-29 DIAGNOSIS — H268 Other specified cataract: Secondary | ICD-10-CM | POA: Diagnosis not present

## 2017-10-07 ENCOUNTER — Other Ambulatory Visit: Payer: Self-pay | Admitting: Cardiology

## 2017-10-07 NOTE — Telephone Encounter (Signed)
Pt is a 77 yr old female who saw Roderic Palau, NP on 06/22/17. Last noted weight was 212 lbs on 09/23/17. Serum creatine was 1.02 on 02/16/17. CrCl is 70 mL/min. Will refill Xarelto 20mg  QD.

## 2017-10-19 ENCOUNTER — Telehealth: Payer: Self-pay | Admitting: Internal Medicine

## 2017-10-19 NOTE — Telephone Encounter (Signed)
1 Vial Order Date: 10/19/17 Shipping Date: 10/19/17

## 2017-10-20 ENCOUNTER — Other Ambulatory Visit: Payer: Self-pay | Admitting: Internal Medicine

## 2017-10-20 DIAGNOSIS — H268 Other specified cataract: Secondary | ICD-10-CM | POA: Diagnosis not present

## 2017-10-20 DIAGNOSIS — H25812 Combined forms of age-related cataract, left eye: Secondary | ICD-10-CM | POA: Diagnosis not present

## 2017-10-20 NOTE — Telephone Encounter (Signed)
1 vial Arrival Date:10/20/17 Lot #: [id number] [id number]

## 2017-10-21 ENCOUNTER — Ambulatory Visit: Payer: Medicare Other

## 2017-10-21 ENCOUNTER — Ambulatory Visit (INDEPENDENT_AMBULATORY_CARE_PROVIDER_SITE_OTHER): Payer: Medicare Other

## 2017-10-21 DIAGNOSIS — J4540 Moderate persistent asthma, uncomplicated: Secondary | ICD-10-CM | POA: Diagnosis not present

## 2017-10-24 ENCOUNTER — Telehealth: Payer: Self-pay | Admitting: Internal Medicine

## 2017-10-25 MED ORDER — MEPOLIZUMAB 100 MG ~~LOC~~ SOLR
100.0000 mg | Freq: Once | SUBCUTANEOUS | Status: AC
  Administered 2017-10-21: 100 mg via SUBCUTANEOUS

## 2017-10-25 NOTE — Telephone Encounter (Addendum)
Called pt to let her know her next appt. Is 11/18/17. I still haven't heard from her.

## 2017-10-28 ENCOUNTER — Encounter: Admit: 2017-10-28 | Discharge: 2017-10-29 | Payer: MEDICARE

## 2017-10-28 DIAGNOSIS — L2089 Other atopic dermatitis: Secondary | ICD-10-CM

## 2017-10-28 DIAGNOSIS — L820 Inflamed seborrheic keratosis: Secondary | ICD-10-CM

## 2017-10-28 DIAGNOSIS — L309 Dermatitis, unspecified: Principal | ICD-10-CM

## 2017-10-28 DIAGNOSIS — R21 Rash and other nonspecific skin eruption: Secondary | ICD-10-CM

## 2017-10-28 MED ORDER — HYDROXYZINE HCL 25 MG TABLET
ORAL_TABLET | Freq: Four times a day (QID) | ORAL | 1 refills | 0.00000 days | Status: CP | PRN
Start: 2017-10-28 — End: 2018-02-20

## 2017-10-28 MED ORDER — CLOBETASOL 0.05 % TOPICAL OINTMENT
Freq: Two times a day (BID) | TOPICAL | 1 refills | 0 days | Status: CP
Start: 2017-10-28 — End: 2018-10-28

## 2017-10-30 NOTE — Progress Notes (Signed)
Cardiology Office Note   Date:  10/31/2017   ID:  Laura Mendez, DOB 23-Dec-1940, MRN [id number]  PCP:  Hoyt Koch, MD  Cardiologist:  DR. Marlou Porch     Chief Complaint  Patient presents with  . Atrial Fibrillation    no recent episodes      History of Present Illness: Laura Mendez is a 77 y.o. female who presents for PAF  She has a history of COPD, asthma, TIA was on plavix until Xarelto stated, paroxysmal atrial flutter on Xarelto.  Her a fib goes back to 11/2015 -she converted spontaneously in ER and placed on Xarelto for CHA2DS2VASC of 5.  The BB she was placed on changed to CCB due to asthma.  She then developed rash with CCB.  She continues with rash despite not on the above meds.  Has seen dermatology without answers.    Last echo 12/2015 EF 55-60% with mild LVH, G1DD, mild MR, LA severely dilated.  PA pressure was 24 mmHg. Marland Kitchen Last saw Roderic Palau 06/22/17, last seen by Dr. Marlou Porch in 2017.    Today she has no complaints, no chest pain or SOB, her rash continues and is followed by dermatology, she believes 100 MDs has seen.   They still do not understand.  She has creams she uses, was unable to take methotrexate.    She exercises 3-4 times per week at [employer] and is trying to eat healthier with more vegetables.   Past Medical History:  Diagnosis Date  . Chronic airway obstruction, not elsewhere classified   . Diastolic dysfunction without heart failure   . Disorder of bone and cartilage, unspecified   . Paroxysmal atrial flutter ([place])   . TIA (transient ischemic attack)   . Unspecified asthma(493.90)     Past Surgical History:  Procedure Laterality Date  . ABDOMINAL HYSTERECTOMY       Current Outpatient Medications  Medication Sig Dispense Refill  . ADVAIR DISKUS 250-50 MCG/DOSE AEPB INHALE 1 PUFF TWICE DAILY, RINSE AFTER USE 180 each 1  . albuterol (VENTOLIN HFA) 108 (90 Base) MCG/ACT inhaler INHALE 2 PUFFS EVERY 4 HOURS AS NEEDED 18 Inhaler  3  . cholecalciferol (VITAMIN D) 1000 units tablet Take 1,000 Units by mouth daily.    . fish oil-omega-3 fatty acids 1000 MG capsule Take 2 g by mouth daily.      . hydrOXYzine (ATARAX/VISTARIL) 25 MG tablet Take 25 mg by mouth. Take one tablet by mouth every 4-6 hours as needed for rash    . MEGARED OMEGA-3 KRILL OIL 500 MG CAPS Take 1 capsule by mouth daily.    . mometasone (ELOCON) 0.1 % cream Apply 1 application topically daily.    . Multiple Vitamin (MULTIVITAMIN) tablet Take 1 tablet by mouth daily.     Marland Kitchen triamcinolone ointment (KENALOG) 0.1 % APPLY TWICE A DAY TO ITCHY, RED, SCALY AREAS UNTIL SMOOTH  3  . XARELTO 20 MG TABS tablet TAKE 1 TABLET BY MOUTH EVERY DAY WITH SUPPER 30 tablet 6   Current Facility-Administered Medications  Medication Dose Route Frequency Provider Last Rate Last Dose  . Mepolizumab SOLR 100 mg  100 mg Subcutaneous Q28 days Baird Lyons D, MD   100 mg at 09/16/17 1412    Allergies:   Codeine; Ibuprofen; Other; Clarithromycin; Codeine phosphate; Diltiazem; Tape; and Verapamil hcl er    Social History:  The patient  reports that she quit smoking about 40 years ago. She quit after 0.00 years of  use. She has never used smokeless tobacco. She reports that she does not drink alcohol or use drugs.   Family History:  The patient's family history includes COPD in her father; Cancer in her mother; Kidney cancer in her sister; Liver disease in her sister; Lung cancer in her sister; Melanoma in her sister; Ovarian cancer in her sister.    ROS:  General:no colds or fevers, no weight changes Skin:no rashes or ulcers HEENT:no blurred vision, no congestion CV:see HPI PUL:see HPI GI:no diarrhea constipation or melena, no indigestion GU:no hematuria, no dysuria MS:no joint pain, no claudication Neuro:no syncope, no lightheadedness Endo:no diabetes, no thyroid disease  Wt Readings from Last 3 Encounters:  10/31/17 213 lb (96.6 kg)  09/23/17 212 lb (96.2 kg)    09/17/17 213 lb (96.6 kg)     PHYSICAL EXAM: VS:  BP 108/64   Pulse 66   Ht 5\' 10"  (1.778 m)   Wt 213 lb (96.6 kg)   SpO2 94%   BMI 30.56 kg/m  , BMI Body mass index is 30.56 kg/m. General:Pleasant affect, NAD Skin:Warm and dry, brisk capillary refill HEENT:normocephalic, sclera clear, mucus membranes moist Neck:supple, no JVD, no bruits  Heart:S1S2 RRR without murmur, gallup, rub or click Lungs:clear without rales, rhonchi, or wheezes BOF:BPZW, non tender, + BS, do not palpate liver spleen or masses Ext:no lower ext edema, 2+ pedal pulses, 2+ radial pulses Neuro:alert and oriented, MAE, follows commands, + facial symmetry    EKG:  EKG is NOT  ordered today.  Recent Labs: 02/16/2017: ALT 24; BUN 16; Creatinine, Ser 1.02; Hemoglobin 14.7; Platelets 268.0; Potassium 4.3; Sodium 143; TSH 2.60    Lipid Panel    Component Value Date/Time   CHOL 214 (H) 01/13/2016 0922   TRIG 164.0 (H) 01/13/2016 0922   TRIG 183 (H) 03/04/2006 1034   HDL 52.00 01/13/2016 0922   CHOLHDL 4 01/13/2016 0922   VLDL 32.8 01/13/2016 0922   LDLCALC 129 (H) 01/13/2016 0922   LDLDIRECT 106.0 08/13/2015 0743       Other studies Reviewed: Additional studies/ records that were reviewed today include: . ECHO 12/29/2015 Study Conclusions  - Left ventricle: The cavity size was mildly dilated. Wall   thickness was increased in a pattern of mild LVH. Systolic   function was normal. The estimated ejection fraction was in the   range of 55% to 60%. Wall motion was normal; there were no   regional wall motion abnormalities. Doppler parameters are   consistent with abnormal left ventricular relaxation (grade 1   diastolic dysfunction). Doppler parameters are consistent with   high ventricular filling pressure. - Mitral valve: Severely calcified annulus. There was mild   regurgitation. Valve area by continuity equation (using LVOT   flow): 2.36 cm^2. - Left atrium: The atrium was severely dilated. -  Right ventricle: The cavity size was normal. Wall thickness was   normal. Systolic function was normal. - Right atrium: The atrium was mildly dilated. - Atrial septum: No defect or patent foramen ovale was identified   by color flow Doppler. - Tricuspid valve: There was trivial regurgitation. - Pulmonary arteries: Systolic pressure was within the normal   range. PA peak pressure: 24 mm Hg (S).  -------------------------------------------------------------------   ASSESSMENT AND PLAN:  1.  PAF no recurrence.  Off CCB. HR rate regular today. Follow up with Dr. Marlou Porch in 1 year- only Dr. Marlou Porch. It has been since 2017.  2.  Anticoagulation on xarelto.no bleeding  3.  Rash continuous, now  not felt secondary to CCB.  Followed by Payton Mccallum, etiology unknown  After biopsies.    Current medicines are reviewed with the patient today.  The patient Has no concerns regarding medicines.  The following changes have been made:  See above Labs/ tests ordered today include:see above  Disposition:   FU:  see above  Signed, Cecilie Kicks, NP  10/31/2017 8:30 AM    [facility], [place], [place], [place] Phone: [phone]; Fax: [phone]

## 2017-10-31 ENCOUNTER — Encounter: Payer: Self-pay | Admitting: Cardiology

## 2017-10-31 ENCOUNTER — Ambulatory Visit: Payer: Medicare Other | Admitting: Cardiology

## 2017-10-31 VITALS — BP 108/64 | HR 66 | Ht 70.0 in | Wt 213.0 lb

## 2017-10-31 DIAGNOSIS — Z7901 Long term (current) use of anticoagulants: Secondary | ICD-10-CM | POA: Diagnosis not present

## 2017-10-31 DIAGNOSIS — R21 Rash and other nonspecific skin eruption: Secondary | ICD-10-CM | POA: Diagnosis not present

## 2017-10-31 DIAGNOSIS — I4892 Unspecified atrial flutter: Secondary | ICD-10-CM

## 2017-10-31 NOTE — Patient Instructions (Signed)
Medication Instructions:  Your physician recommends that you continue on your current medications as directed. Please refer to the Current Medication list given to you today.  Labwork: NONE  Testing/Procedures: NONE  Follow-Up: Your physician wants you to follow-up in: 6 months with Dr. Skains. You will receive a reminder letter in the mail two months in advance. If you don't receive a letter, please call our office to schedule the follow-up appointment.   If you need a refill on your cardiac medications before your next appointment, please call your pharmacy.    

## 2017-11-07 ENCOUNTER — Other Ambulatory Visit: Payer: Self-pay | Admitting: Internal Medicine

## 2017-11-07 ENCOUNTER — Ambulatory Visit
Admission: RE | Admit: 2017-11-07 | Discharge: 2017-11-07 | Disposition: A | Discharge status: Home / Self Care | Payer: Medicare Other | Source: Ambulatory Visit | Attending: Internal Medicine | Admitting: Internal Medicine

## 2017-11-07 DIAGNOSIS — N6310 Unspecified lump in the right breast, unspecified quadrant: Secondary | ICD-10-CM

## 2017-11-07 DIAGNOSIS — R928 Other abnormal and inconclusive findings on diagnostic imaging of breast: Secondary | ICD-10-CM | POA: Diagnosis not present

## 2017-11-07 DIAGNOSIS — C50511 Malignant neoplasm of lower-outer quadrant of right female breast: Secondary | ICD-10-CM | POA: Diagnosis not present

## 2017-11-07 DIAGNOSIS — N6313 Unspecified lump in the right breast, lower outer quadrant: Secondary | ICD-10-CM | POA: Diagnosis not present

## 2017-11-07 DIAGNOSIS — N6489 Other specified disorders of breast: Secondary | ICD-10-CM | POA: Diagnosis not present

## 2017-11-07 IMAGING — MG DIGITAL DIAGNOSTIC BILATERAL MAMMOGRAM WITH TOMO AND CAD
8 of 15 series · 8 of 40 positions shown · non-contrast
Comparison: [DATE] and earlier

ADDENDUM:
After biopsy and following post biopsy clip views, the patient was
taken back to ultrasound to evaluate the LOWER portion of the RIGHT
breast for a possible second mass. Targeted ultrasound reveals an
oval mass with irregular margins in the 7 o'clock location of the
RIGHT breast 4 centimeters from the nipple which is associated blood
flow. Mass measures 0.7 x 0.6 x 0.6 centimeters.

The BB is placed over this lesion and follow-up tangential views
were performed mammographically. Although this lesion may account
for the mammographic abnormality, it is difficult to be certain,
given the post-biopsy changes.
Therefore, stereotactic guided core biopsy of mass in the LOWER
central posterior portion of the RIGHT breast is recommended. I
discussed the findings and recommendations with the patient. She is
scheduled to return for stereotactic biopsy [DATE].
IF either biopsy is positive, MRI could be considered for pre
treatment planning.
CLINICAL DATA: First six-month follow-up for probably benign
findings in the LOWER portion of the RIGHT breast.
EXAM:
DIGITAL DIAGNOSTIC RIGHT MAMMOGRAM WITH CAD AND TOMO
ULTRASOUND RIGHT BREAST

[L MLO synth-2D]
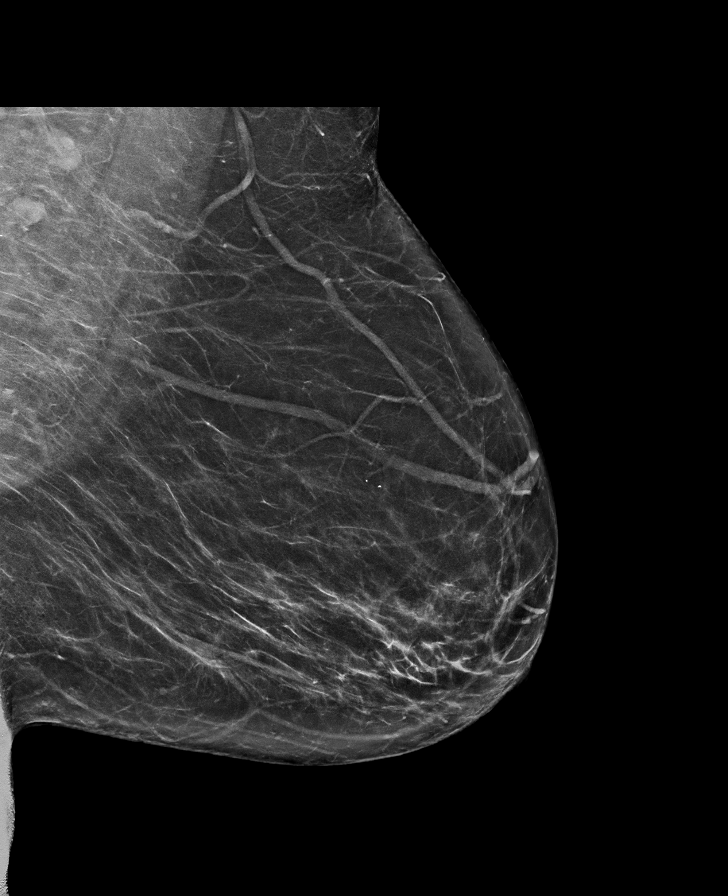

[R CC synth-2D (1 of 3)]
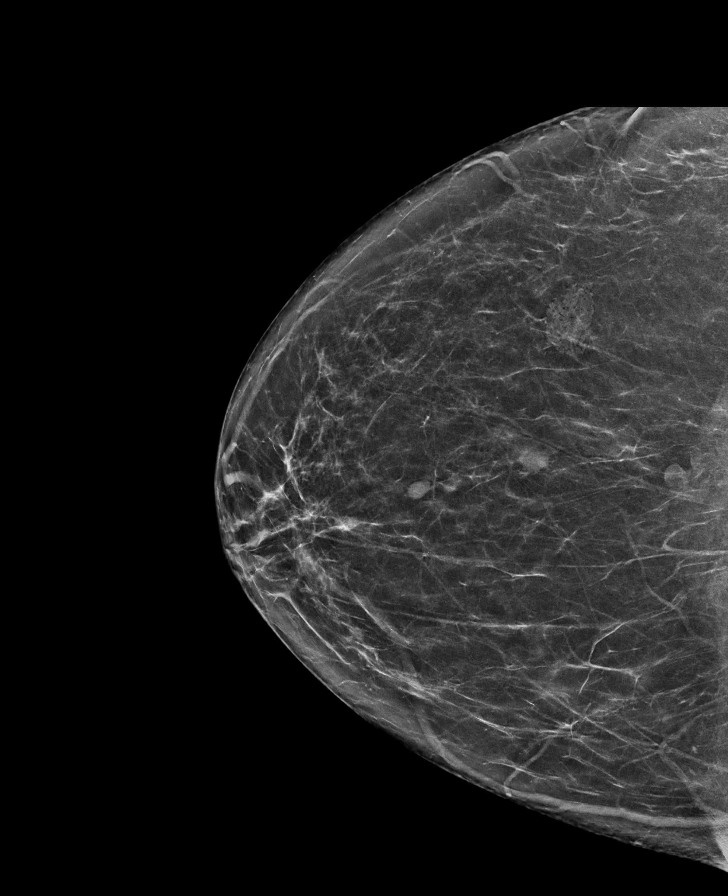

[L CC synth-2D]
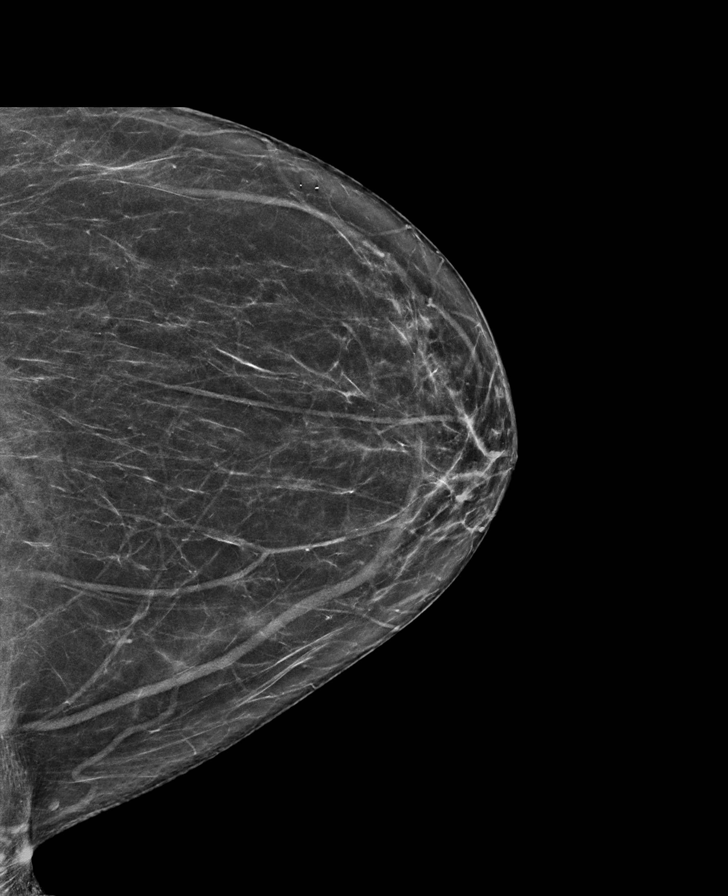

[R CC synth-2D (2 of 3)]
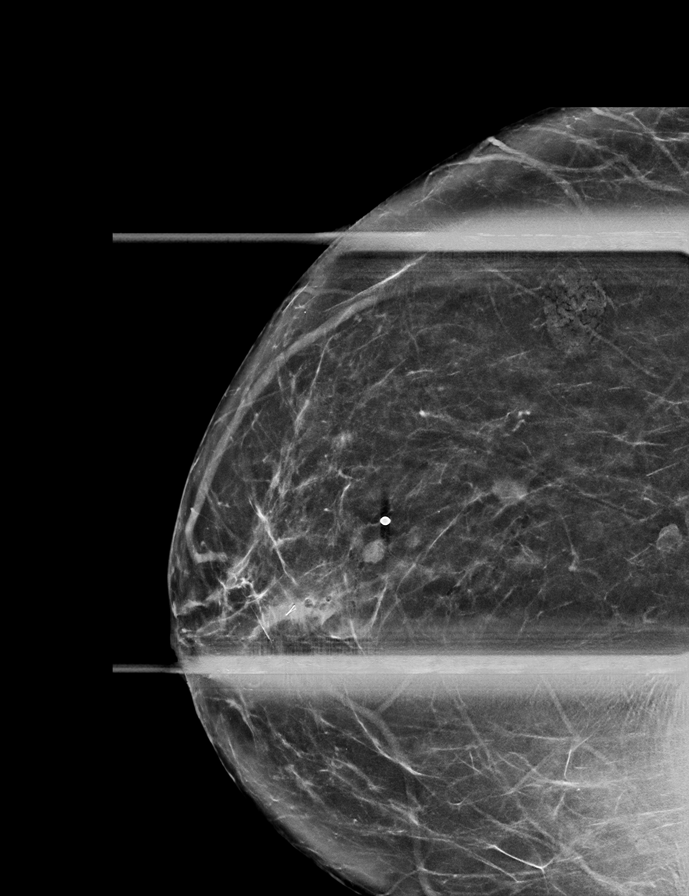

[R SIO synth-2D]
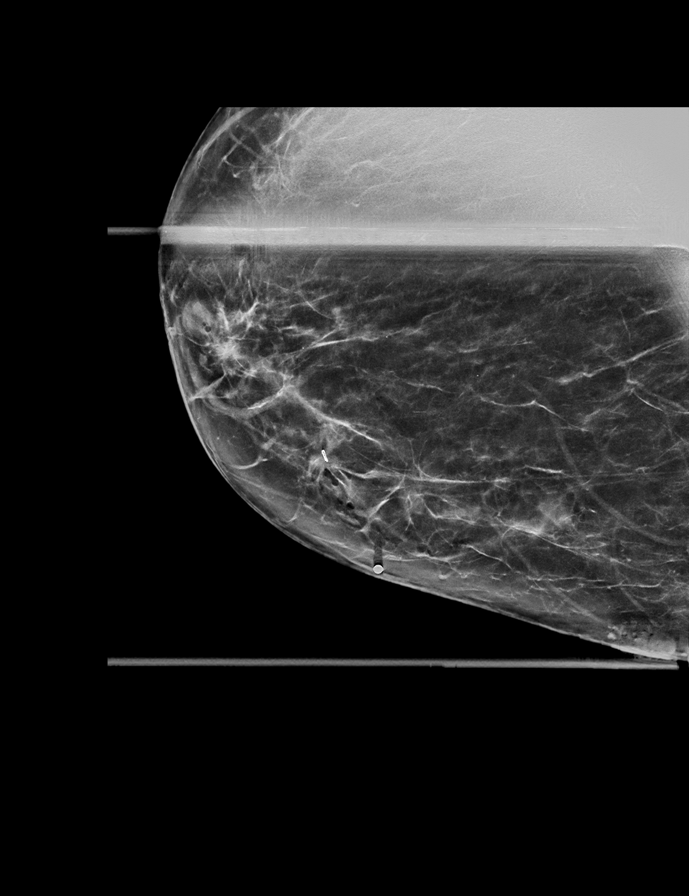

[R MLO synth-2D]
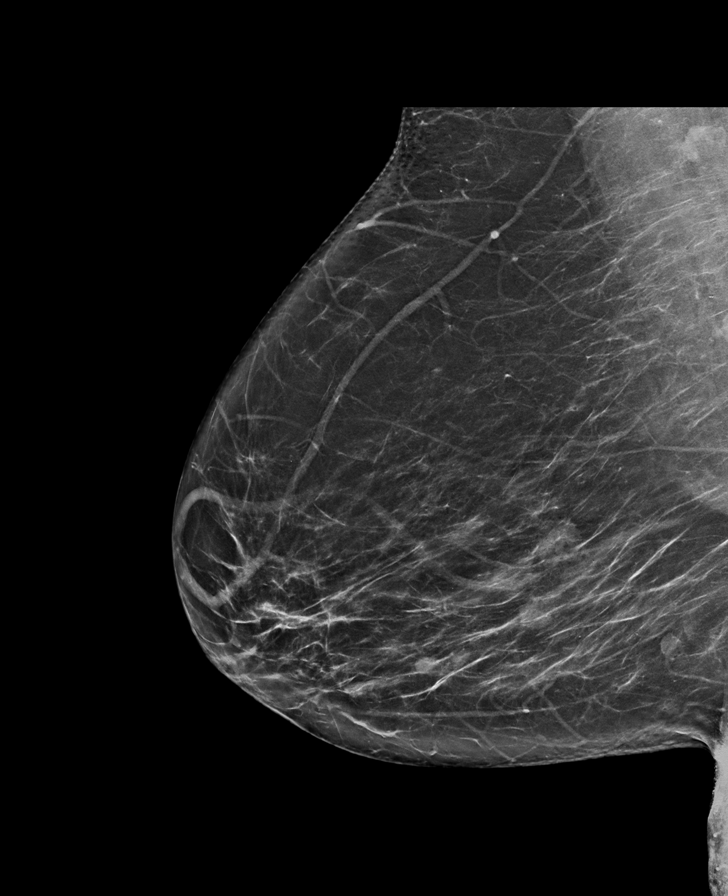

[R CC synth-2D (3 of 3)]
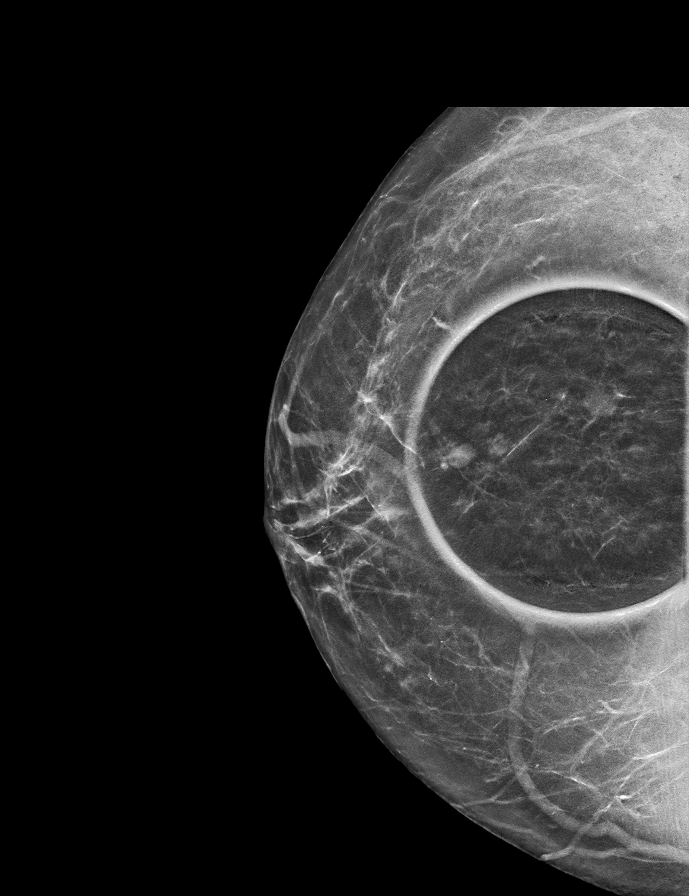

[R SIO tomo · tomo slice 44/64.0]
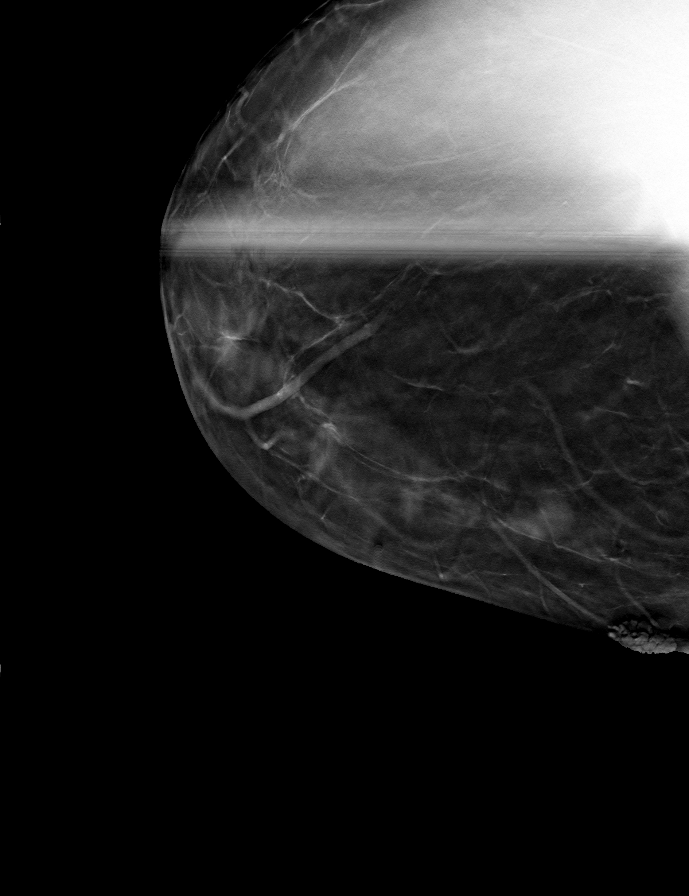

[8 of 40 positions shown; findings below may reference images not displayed]

ACR Breast Density Category b: There are scattered areas of
fibroglandular density.
FINDINGS: Multiple circumscribed oval masses are identified in the LOWER
central portion of the RIGHT breast. In oval mass with spiculated
margins is identified in the LOWER portion of the RIGHT breast,
confirmed on spot compression views. The LEFT breast is negative.

Mammographic images were processed with CAD.

On physical exam, I palpate no abnormality in the LOWER aspect of
the RIGHT breast.

Targeted ultrasound is performed, showing numerous small cysts in
the LOWER portion of the RIGHT breast. In the 6 o'clock location 3
centimeters from the nipple, there is a taller than wide oval mass
with echogenic margins measuring 0.4 x 0.5 x 0.4 centimeters. There
is associated internal vascularity and posterior acoustic shadowing.
Evaluation of the RIGHT axilla is negative for adenopathy.
IMPRESSION: Suspicious mass in the 6 o'clock location of the RIGHT breast which
biopsy is recommended.

No adenopathy by ultrasound.

RECOMMENDATION:
Ultrasound-guided core biopsy of the RIGHT breast, to be performed
later today.

I have discussed the findings and recommendations with the patient.
Results were also provided in writing at the conclusion of the
visit. If applicable, a reminder letter will be sent to the patient
regarding the next appointment.

BI-RADS CATEGORY  4: Suspicious.

## 2017-11-07 IMAGING — MG MM CLIP PLACEMENT
2 series · 2 of 2 positions shown · non-contrast
Comparison: Previous exam(s).

CLINICAL DATA: Status post ultrasound-guided core needle biopsy of
a 5 mm mass in the 7 o'clock position of the right breast, 1 cm from
the nipple.

EXAM:
DIAGNOSTIC RIGHT MAMMOGRAM POST ULTRASOUND BIOPSY

[R CC]
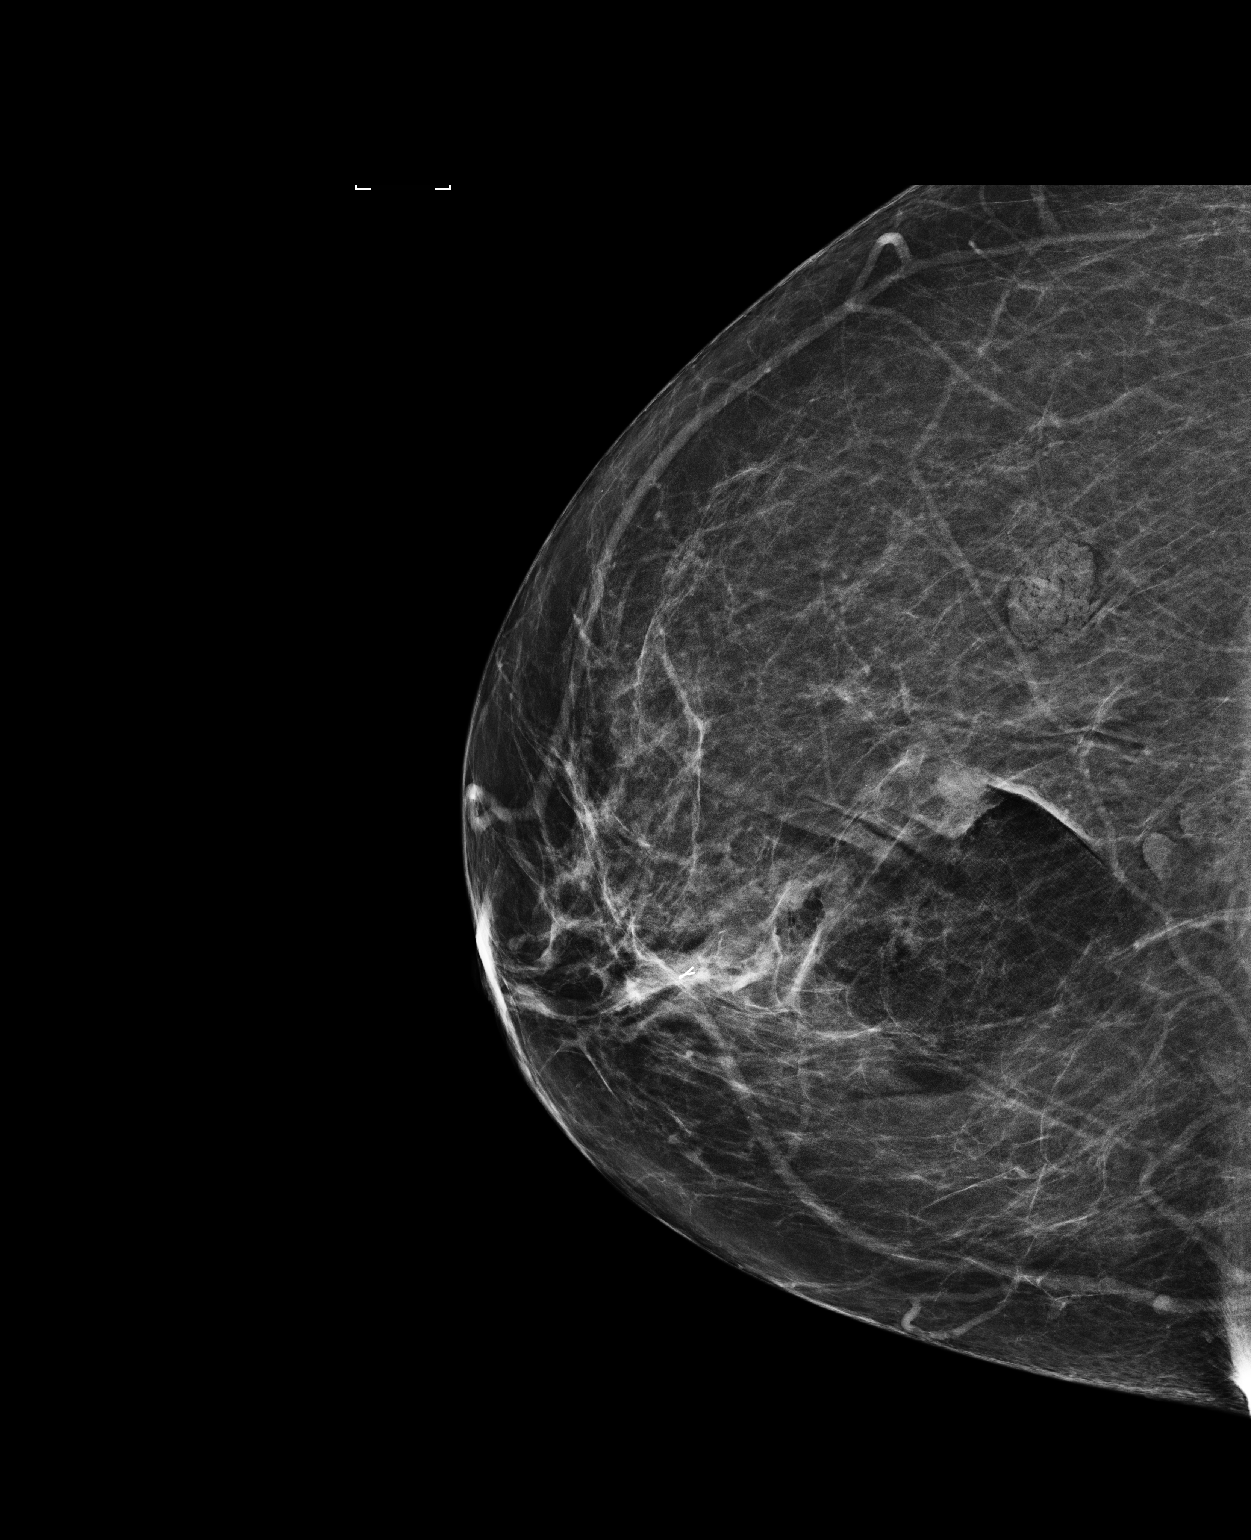

[R ML]
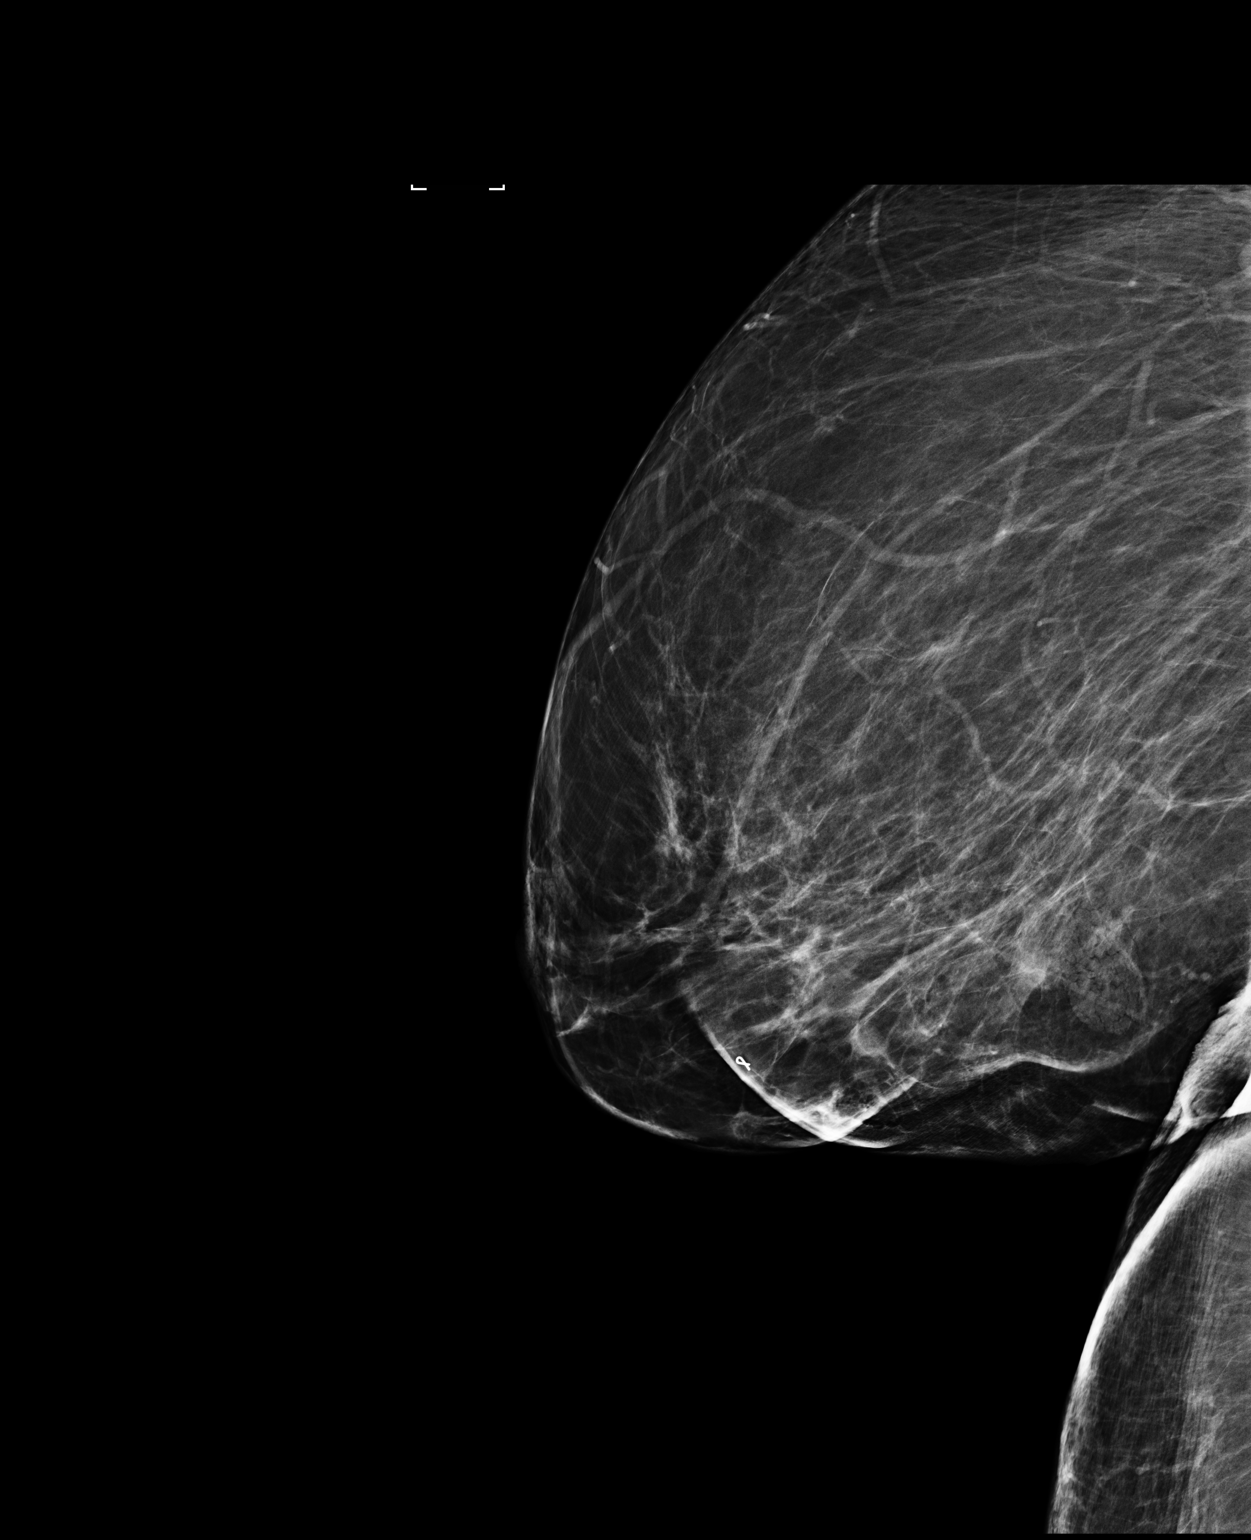

[2 of 2 positions shown; findings below may reference images not displayed]

FINDINGS: Mammographic images were obtained following ultrasound guided biopsy
of a 5 mm mass in the 7 o'clock position of the right breast, 1 cm
from the nipple. These demonstrate a ribbon shaped biopsy marker
clip at the expected position of the biopsied mass. There is a more
posteriorly located mass in the central right breast inferiorly that
appears to have spiculated margins on the mammogram earlier today
and indistinct margins on today's post clip images. This was
re-evaluated with additional mammogram and ultrasound images
following today's biopsy with no definite sonographic correlate.
Therefore, the patient has been scheduled for a stereotactic guided
core needle biopsy of this mass at [DATE] a.m. on [DATE].
IMPRESSION: 1. Appropriate clip deployment following right breast
ultrasound-guided core needle biopsy.
2. Second mass more posteriorly in the central right breast,
inferiorly with mammographic features suspicious for malignancy.
This has been scheduled for the stereotactic guided core needle
biopsy at [DATE] a.m. on [DATE].

Final Assessment: Post Procedure Mammograms for Marker Placement

## 2017-11-08 NOTE — Telephone Encounter (Signed)
Appt. Has been made. Nothing further needed.

## 2017-11-09 ENCOUNTER — Telehealth: Payer: Self-pay | Admitting: Hematology and Oncology

## 2017-11-09 NOTE — Telephone Encounter (Signed)
Spoke with patient to confirm morning [facility] appointment on 6/26, [facility] will send packet, I will send appointment reminder

## 2017-11-10 ENCOUNTER — Ambulatory Visit
Admission: RE | Admit: 2017-11-10 | Discharge: 2017-11-10 | Disposition: A | Discharge status: Home / Self Care | Payer: Medicare Other | Source: Ambulatory Visit | Attending: Internal Medicine | Admitting: Internal Medicine

## 2017-11-10 ENCOUNTER — Telehealth: Payer: Self-pay | Admitting: Internal Medicine

## 2017-11-10 ENCOUNTER — Encounter: Payer: Self-pay | Admitting: *Deleted

## 2017-11-10 DIAGNOSIS — N6310 Unspecified lump in the right breast, unspecified quadrant: Secondary | ICD-10-CM

## 2017-11-10 DIAGNOSIS — N6011 Diffuse cystic mastopathy of right breast: Secondary | ICD-10-CM | POA: Diagnosis not present

## 2017-11-10 DIAGNOSIS — N6313 Unspecified lump in the right breast, lower outer quadrant: Secondary | ICD-10-CM | POA: Diagnosis not present

## 2017-11-11 ENCOUNTER — Telehealth: Payer: Self-pay | Admitting: Internal Medicine

## 2017-11-11 NOTE — Telephone Encounter (Signed)
Error

## 2017-11-11 NOTE — Telephone Encounter (Signed)
1 vial Arrival Date:11/11/17 Lot #: [id number]  Exp Date:05/2021

## 2017-11-11 NOTE — Telephone Encounter (Signed)
1 Vial Order Date: 11/10/17 Shipping Date: 11/10/17 Katie aware.

## 2017-11-15 ENCOUNTER — Other Ambulatory Visit: Payer: Self-pay | Admitting: *Deleted

## 2017-11-15 DIAGNOSIS — C50511 Malignant neoplasm of lower-outer quadrant of right female breast: Secondary | ICD-10-CM | POA: Insufficient documentation

## 2017-11-15 DIAGNOSIS — Z170 Estrogen receptor positive status [ER+]: Principal | ICD-10-CM

## 2017-11-15 DIAGNOSIS — Z853 Personal history of malignant neoplasm of breast: Secondary | ICD-10-CM | POA: Insufficient documentation

## 2017-11-16 ENCOUNTER — Inpatient Hospital Stay: Payer: Medicare Other

## 2017-11-16 ENCOUNTER — Encounter: Payer: Self-pay | Admitting: Physical Therapy

## 2017-11-16 ENCOUNTER — Inpatient Hospital Stay: Payer: Medicare Other | Attending: Hematology and Oncology | Admitting: Hematology and Oncology

## 2017-11-16 ENCOUNTER — Other Ambulatory Visit: Payer: Self-pay

## 2017-11-16 ENCOUNTER — Ambulatory Visit: Payer: Medicare Other | Attending: General Surgery | Admitting: Physical Therapy

## 2017-11-16 ENCOUNTER — Ambulatory Visit
Admission: RE | Admit: 2017-11-16 | Discharge: 2017-11-16 | Disposition: A | Discharge status: Home / Self Care | Payer: Medicare Other | Source: Ambulatory Visit | Attending: Radiation Oncology | Admitting: Radiation Oncology

## 2017-11-16 ENCOUNTER — Encounter: Payer: Self-pay | Admitting: Hematology and Oncology

## 2017-11-16 DIAGNOSIS — Z87891 Personal history of nicotine dependence: Secondary | ICD-10-CM | POA: Diagnosis not present

## 2017-11-16 DIAGNOSIS — Z7901 Long term (current) use of anticoagulants: Secondary | ICD-10-CM | POA: Diagnosis not present

## 2017-11-16 DIAGNOSIS — R293 Abnormal posture: Secondary | ICD-10-CM | POA: Insufficient documentation

## 2017-11-16 DIAGNOSIS — C50511 Malignant neoplasm of lower-outer quadrant of right female breast: Secondary | ICD-10-CM

## 2017-11-16 DIAGNOSIS — J449 Chronic obstructive pulmonary disease, unspecified: Secondary | ICD-10-CM | POA: Diagnosis not present

## 2017-11-16 DIAGNOSIS — Z79811 Long term (current) use of aromatase inhibitors: Secondary | ICD-10-CM

## 2017-11-16 DIAGNOSIS — Z170 Estrogen receptor positive status [ER+]: Principal | ICD-10-CM

## 2017-11-16 DIAGNOSIS — Z8673 Personal history of transient ischemic attack (TIA), and cerebral infarction without residual deficits: Secondary | ICD-10-CM | POA: Diagnosis not present

## 2017-11-16 DIAGNOSIS — Z808 Family history of malignant neoplasm of other organs or systems: Secondary | ICD-10-CM | POA: Diagnosis not present

## 2017-11-16 DIAGNOSIS — I4892 Unspecified atrial flutter: Secondary | ICD-10-CM

## 2017-11-16 DIAGNOSIS — C50411 Malignant neoplasm of upper-outer quadrant of right female breast: Secondary | ICD-10-CM | POA: Diagnosis not present

## 2017-11-16 DIAGNOSIS — Z79899 Other long term (current) drug therapy: Secondary | ICD-10-CM

## 2017-11-16 LAB — CMP (CANCER CENTER ONLY)
ALBUMIN: 4.1 g/dL (ref 3.5–5.0)
ALT: 23 U/L (ref 0–44)
AST: 27 U/L (ref 15–41)
Alkaline Phosphatase: 78 U/L (ref 38–126)
Anion gap: 10 (ref 5–15)
BUN: 14 mg/dL (ref 8–23)
CHLORIDE: 104 mmol/L (ref 98–111)
CO2: 27 mmol/L (ref 22–32)
Calcium: 10.2 mg/dL (ref 8.9–10.3)
Creatinine: 0.96 mg/dL (ref 0.44–1.00)
GFR, Est AFR Am: >60 mL/min (ref >60)
GFR, Estimated: 56 mL/min — ABNORMAL LOW (ref >60)
GLUCOSE: 90 mg/dL (ref 70–99)
POTASSIUM: 4 mmol/L (ref 3.5–5.1)
Sodium: 141 mmol/L (ref 135–145)
Total Bilirubin: 0.5 mg/dL (ref 0.3–1.2)
Total Protein: 7.7 g/dL (ref 6.5–8.1)

## 2017-11-16 LAB — CBC WITH DIFFERENTIAL (CANCER CENTER ONLY)
Basophils Absolute: 0 10*3/uL (ref 0.0–0.1)
Basophils Relative: 0 %
EOS PCT: 1 %
Eosinophils Absolute: 0.1 10*3/uL (ref 0.0–0.5)
HCT: 42.8 % (ref 34.8–46.6)
Hemoglobin: 14.3 g/dL (ref 11.6–15.9)
LYMPHS ABS: 1.5 10*3/uL (ref 0.9–3.3)
Lymphocytes Relative: 19 %
MCH: 30.3 pg (ref 25.1–34.0)
MCHC: 33.4 g/dL (ref 31.5–36.0)
MCV: 90.7 fL (ref 79.5–101.0)
MONO ABS: 0.8 10*3/uL (ref 0.1–0.9)
MONOS PCT: 11 %
Neutro Abs: 5.5 10*3/uL (ref 1.5–6.5)
Neutrophils Relative %: 69 %
PLATELETS: 234 10*3/uL (ref 145–400)
RBC: 4.71 MIL/uL (ref 3.70–5.45)
RDW: 13.5 % (ref 11.2–14.5)
WBC Count: 7.9 10*3/uL (ref 3.9–10.3)

## 2017-11-16 MED ORDER — LETROZOLE 2.5 MG PO TABS: 2.5000 mg | ORAL_TABLET | Freq: Every day | ORAL | 3 refills | Status: DC

## 2017-11-16 NOTE — Progress Notes (Signed)
Radiation Oncology         [phone] ________________________________  Initial Outpatient Consultation  Name: [name] MRN: [id number]  Date: 11/16/2017  DOB: 11/16/1940  MO:[id number], Real Cons, MD  Rolm Bookbinder, MD   REFERRING PHYSICIAN: Rolm Bookbinder, MD  DIAGNOSIS: T1 a N0 stage I a clinical stage,  invasive ductal carcinoma of the right breast, grade 1, ER/PR positive, HER-2 negative.  HISTORY OF PRESENT ILLNESS::Laura Mendez is a 77 y.o. female who is seen today in our multidisciplinary breast clinic. The patient presented for six-month follow-up diagnostic mammogram on 11/07/2017 and was found to have a 0.4 x 0.5 x 0.4 suspicious mass in the 6 o'clock location of the right breast and numerous small cysts in the lower portion of the right breast. No adenopathy by ultrasound. Biopsy of the right breast 7 o'clock position reveals invasive ductal carcinoma with calcifications and DCIS, grade 1, ER 100%, PR 100%, HER-2 negative, Ki-67 1%. Biopsy of the right breast lower slightly outer quadrant reveals atypical ductal hyperplasia, lobular neoplasia (atypical lobular hyperplasia), and fibrocystic.  Family history includes sister with ovarian cancer and melanoma.   The patient is here for further evaluation and discussion of radiation treatment options in the management of her disease.  PREVIOUS RADIATION THERAPY: No  PAST MEDICAL HISTORY:  has a past medical history of Chronic airway obstruction, not elsewhere classified, Diastolic dysfunction without heart failure, Disorder of bone and cartilage, unspecified, Paroxysmal atrial flutter ([place]), TIA (transient ischemic attack), and Unspecified asthma(493.90).    PAST SURGICAL HISTORY: Past Surgical History:  Procedure Laterality Date  . ABDOMINAL HYSTERECTOMY      FAMILY HISTORY: family history includes COPD in her father; Cancer in her mother; Kidney cancer in her sister; Liver disease in her sister; Lung cancer  in her sister; Melanoma in her sister; Ovarian cancer in her sister.  SOCIAL HISTORY:  reports that she quit smoking about 40 years ago. She quit after 0.00 years of use. She has never used smokeless tobacco. She reports that she does not drink alcohol or use drugs.  ALLERGIES: Codeine; Ibuprofen; Other; Clarithromycin; Codeine phosphate; Diltiazem; Tape; and Verapamil hcl er  MEDICATIONS:  Current Outpatient Medications  Medication Sig Dispense Refill  . ADVAIR DISKUS 250-50 MCG/DOSE AEPB INHALE 1 PUFF TWICE DAILY, RINSE AFTER USE 180 each 1  . albuterol (VENTOLIN HFA) 108 (90 Base) MCG/ACT inhaler INHALE 2 PUFFS EVERY 4 HOURS AS NEEDED 18 Inhaler 3  . cholecalciferol (VITAMIN D) 1000 units tablet Take 1,000 Units by mouth daily.    . fish oil-omega-3 fatty acids 1000 MG capsule Take 2 g by mouth daily.      . hydrOXYzine (ATARAX/VISTARIL) 25 MG tablet Take 25 mg by mouth. Take one tablet by mouth every 4-6 hours as needed for rash    . MEGARED OMEGA-3 KRILL OIL 500 MG CAPS Take 1 capsule by mouth daily.    . mometasone (ELOCON) 0.1 % cream Apply 1 application topically daily.    . Multiple Vitamin (MULTIVITAMIN) tablet Take 1 tablet by mouth daily.     Marland Kitchen triamcinolone ointment (KENALOG) 0.1 % APPLY TWICE A DAY TO ITCHY, RED, SCALY AREAS UNTIL SMOOTH  3  . XARELTO 20 MG TABS tablet TAKE 1 TABLET BY MOUTH EVERY DAY WITH SUPPER 30 tablet 6   Current Facility-Administered Medications  Medication Dose Route Frequency Provider Last Rate Last Dose  . Mepolizumab SOLR 100 mg  100 mg Subcutaneous Q28 days Deneise Lever, MD  100 mg at 09/16/17 1412   Gynecologic History  Age at first menstrual period? 1956  Are you still having periods? No Approximate date of last period? 1990  If you are still having periods: Are your periods regular? No  If you no longer have periods: Have you used hormone replacement? No  If YES, for how long? N/A When did you stop? N/A Obstetric History:  How many  children have you carried to term? 2 Your age at first live birth? 51  Pregnant now or trying to get pregnant? No  Have you used birth control pills or hormone shots for contraception? No  If so, for how long (or approximate dates)? N/A  Would you be interested in learning more about the options to preserve fertility? N/A Health Maintenance:  Have you ever had a colonoscopy? Yes If yes, date? 2015  Have you ever had a bone density? Yes If yes, date? 2018  Date of your last PAP smear? 2012 (?) Date of your FIRST mammogram? +- 30 years ago  REVIEW OF SYSTEMS:   A 10+ POINT REVIEW OF SYSTEMS WAS OBTAINED including neurology, dermatology, psychiatry, cardiac, respiratory, lymph, extremities, GI, GU, musculoskeletal, constitutional, reproductive, HEENT. All pertinent positives are noted in the HPI. All others are negative.   PHYSICAL EXAM:   Vitals with BMI 11/16/2017  Height '5\' 10"'$   Weight 211 lbs 11 oz  BMI 17.00  Systolic 174  Diastolic 70  Pulse 63  Respirations 17   Lungs are clear to auscultation bilaterally. Heart has regular rate and rhythm. No palpable cervical, supraclavicular, or axillary adenopathy. Abdomen soft, non-tender, normal bowel sounds. Left breast no palpable mass, nipple discharge or bleeding. Right breast extensive bruising throughout much of the breast from her biopsies. Detailed exam not possible in light of this sensitive bruising.   ECOG = 1   LABORATORY DATA:  Lab Results  Component Value Date   WBC 7.9 11/16/2017   HGB 14.3 11/16/2017   HCT 42.8 11/16/2017   MCV 90.7 11/16/2017   PLT 234 11/16/2017   NEUTROABS 5.5 11/16/2017   Lab Results  Component Value Date   NA 143 02/16/2017   K 4.3 02/16/2017   CL 104 02/16/2017   CO2 30 02/16/2017   GLUCOSE 103 (H) 02/16/2017   CREATININE 1.02 02/16/2017   CALCIUM 10.2 02/16/2017      RADIOGRAPHY: US Breast Ltd Uni Right Inc Axilla  Addendum Date: 11/07/2017   ADDENDUM REPORT: 11/07/2017 16:26  ADDENDUM: After biopsy and following post biopsy clip views, the patient was taken back to ultrasound to evaluate the LOWER portion of the RIGHT breast for a possible second mass. Targeted ultrasound reveals an oval mass with irregular margins in the 7 o'clock location of the RIGHT breast 4 centimeters from the nipple which is associated blood flow. Mass measures 0.7 x 0.6 x 0.6 centimeters. The BB is placed over this lesion and follow-up tangential views were performed mammographically. Although this lesion may account for the mammographic abnormality, it is difficult to be certain, given the post-biopsy changes. Therefore, stereotactic guided core biopsy of mass in the LOWER central posterior portion of the RIGHT breast is recommended. I discussed the findings and recommendations with the patient. She is scheduled to return for stereotactic biopsy 11/10/2017. IF either biopsy is positive, MRI could be considered for pre treatment planning. Electronically Signed   By: Nolon Nations M.D.   On: 11/07/2017 16:26   Result Date: 11/07/2017 CLINICAL DATA:  First six-month follow-up for  probably benign findings in the LOWER portion of the RIGHT breast. EXAM: DIGITAL DIAGNOSTIC RIGHT MAMMOGRAM WITH CAD AND TOMO ULTRASOUND RIGHT BREAST COMPARISON:  05/12/2017 and earlier ACR Breast Density Category b: There are scattered areas of fibroglandular density. FINDINGS: Multiple circumscribed oval masses are identified in the LOWER central portion of the RIGHT breast. In oval mass with spiculated margins is identified in the LOWER portion of the RIGHT breast, confirmed on spot compression views. The LEFT breast is negative. Mammographic images were processed with CAD. On physical exam, I palpate no abnormality in the LOWER aspect of the RIGHT breast. Targeted ultrasound is performed, showing numerous small cysts in the LOWER portion of the RIGHT breast. In the 6 o'clock location 3 centimeters from the nipple, there is a  taller than wide oval mass with echogenic margins measuring 0.4 x 0.5 x 0.4 centimeters. There is associated internal vascularity and posterior acoustic shadowing. Evaluation of the RIGHT axilla is negative for adenopathy. IMPRESSION: Suspicious mass in the 6 o'clock location of the RIGHT breast which biopsy is recommended. No adenopathy by ultrasound. RECOMMENDATION: Ultrasound-guided core biopsy of the RIGHT breast, to be performed later today. I have discussed the findings and recommendations with the patient. Results were also provided in writing at the conclusion of the visit. If applicable, a reminder letter will be sent to the patient regarding the next appointment. BI-RADS CATEGORY  4: Suspicious. Electronically Signed: By: Nolon Nations M.D. On: 11/07/2017 09:20   Mm Diag Breast Tomo Bilateral  Addendum Date: 11/07/2017   ADDENDUM REPORT: 11/07/2017 16:26 ADDENDUM: After biopsy and following post biopsy clip views, the patient was taken back to ultrasound to evaluate the LOWER portion of the RIGHT breast for a possible second mass. Targeted ultrasound reveals an oval mass with irregular margins in the 7 o'clock location of the RIGHT breast 4 centimeters from the nipple which is associated blood flow. Mass measures 0.7 x 0.6 x 0.6 centimeters. The BB is placed over this lesion and follow-up tangential views were performed mammographically. Although this lesion may account for the mammographic abnormality, it is difficult to be certain, given the post-biopsy changes. Therefore, stereotactic guided core biopsy of mass in the LOWER central posterior portion of the RIGHT breast is recommended. I discussed the findings and recommendations with the patient. She is scheduled to return for stereotactic biopsy 11/10/2017. IF either biopsy is positive, MRI could be considered for pre treatment planning. Electronically Signed   By: Nolon Nations M.D.   On: 11/07/2017 16:26   Result Date: 11/07/2017 CLINICAL  DATA:  First six-month follow-up for probably benign findings in the LOWER portion of the RIGHT breast. EXAM: DIGITAL DIAGNOSTIC RIGHT MAMMOGRAM WITH CAD AND TOMO ULTRASOUND RIGHT BREAST COMPARISON:  05/12/2017 and earlier ACR Breast Density Category b: There are scattered areas of fibroglandular density. FINDINGS: Multiple circumscribed oval masses are identified in the LOWER central portion of the RIGHT breast. In oval mass with spiculated margins is identified in the LOWER portion of the RIGHT breast, confirmed on spot compression views. The LEFT breast is negative. Mammographic images were processed with CAD. On physical exam, I palpate no abnormality in the LOWER aspect of the RIGHT breast. Targeted ultrasound is performed, showing numerous small cysts in the LOWER portion of the RIGHT breast. In the 6 o'clock location 3 centimeters from the nipple, there is a taller than wide oval mass with echogenic margins measuring 0.4 x 0.5 x 0.4 centimeters. There is associated internal vascularity and posterior acoustic shadowing.  Evaluation of the RIGHT axilla is negative for adenopathy. IMPRESSION: Suspicious mass in the 6 o'clock location of the RIGHT breast which biopsy is recommended. No adenopathy by ultrasound. RECOMMENDATION: Ultrasound-guided core biopsy of the RIGHT breast, to be performed later today. I have discussed the findings and recommendations with the patient. Results were also provided in writing at the conclusion of the visit. If applicable, a reminder letter will be sent to the patient regarding the next appointment. BI-RADS CATEGORY  4: Suspicious. Electronically Signed: By: Nolon Nations M.D. On: 11/07/2017 09:20   Mm Clip Placement Right  Result Date: 11/10/2017 CLINICAL DATA:  Status post stereotactic core needle biopsy of a right breast mass. EXAM: DIAGNOSTIC RIGHT MAMMOGRAM POST STEREOTACTIC BIOPSY COMPARISON:  Previous exam(s). FINDINGS: Mammographic images were obtained following  stereotactic guided biopsy of a right breast mass, which lies posterior to the previously biopsied small right breast carcinoma marked with a ribbon shaped clip. The coil shaped biopsy clip lies along the anterolateral margin of the mass. IMPRESSION: Well-positioned coil shaped biopsy clip following stereotactic core needle biopsy of a right breast mass. Final Assessment: Post Procedure Mammograms for Marker Placement Electronically Signed   By: Lajean Manes M.D.   On: 11/10/2017 09:07   Mm Clip Placement Right  Result Date: 11/07/2017 CLINICAL DATA:  Status post ultrasound-guided core needle biopsy of a 5 mm mass in the 7 o'clock position of the right breast, 1 cm from the nipple. EXAM: DIAGNOSTIC RIGHT MAMMOGRAM POST ULTRASOUND BIOPSY COMPARISON:  Previous exam(s). FINDINGS: Mammographic images were obtained following ultrasound guided biopsy of a 5 mm mass in the 7 o'clock position of the right breast, 1 cm from the nipple. These demonstrate a ribbon shaped biopsy marker clip at the expected position of the biopsied mass. There is a more posteriorly located mass in the central right breast inferiorly that appears to have spiculated margins on the mammogram earlier today and indistinct margins on today's post clip images. This was re-evaluated with additional mammogram and ultrasound images following today's biopsy with no definite sonographic correlate. Therefore, the patient has been scheduled for a stereotactic guided core needle biopsy of this mass at 8:30 a.m. on 11/10/2017. IMPRESSION: 1. Appropriate clip deployment following right breast ultrasound-guided core needle biopsy. 2. Second mass more posteriorly in the central right breast, inferiorly with mammographic features suspicious for malignancy. This has been scheduled for the stereotactic guided core needle biopsy at 8:30 a.m. on 11/10/2017. Final Assessment: Post Procedure Mammograms for Marker Placement Electronically Signed   By: Claudie Revering  M.D.   On: 11/07/2017 16:34   Mm Rt Breast Bx W Loc Dev 1st Lesion Image Bx Spec Stereo Guide  Addendum Date: 11/16/2017   ADDENDUM REPORT: 11/16/2017 08:10 ADDENDUM: During conference preparation, it came to my attention that the stereotactic biopsy did not target the most posterior spiculated nodule. The most posterior spiculated nodule has not been biopsied. Biopsy of this nodule is recommended. This was discussed at conference. Electronically Signed   By: Dorise Bullion III M.D   On: 11/16/2017 08:10   Result Date: 11/16/2017 CLINICAL DATA:  Patient presents for stereotactic core needle biopsy of a right breast mass, which lies posterior to a small invasive mammary carcinoma, which was biopsied on 11/07/2017. EXAM: RIGHT BREAST STEREOTACTIC CORE NEEDLE BIOPSY COMPARISON:  Previous exams. FINDINGS: The patient and I discussed the procedure of stereotactic-guided biopsy including benefits and alternatives. We discussed the high likelihood of a successful procedure. We discussed the risks of the  procedure including infection, bleeding, tissue injury, clip migration, and inadequate sampling. Informed written consent was given. The usual time out protocol was performed immediately prior to the procedure. Using sterile technique and 1% Lidocaine as local anesthetic, under stereotactic guidance, a 9 gauge vacuum assisted device was used to perform core needle biopsy of the small mass in the inferior, slightly lateral aspect of the right breast, posterior to the previously biopsied small carcinoma that was marked with a ribbon shaped biopsy clip, using a lateral approach. Lesion quadrant: Lower outer quadrant At the conclusion of the procedure, a coil shaped tissue marker clip was deployed into the biopsy cavity. Follow-up 2-view mammogram was performed and dictated separately. IMPRESSION: Stereotactic-guided biopsy of a right breast mass. No apparent complications. Electronically Signed: By: Lajean Manes M.D.  On: 11/10/2017 09:03   [place] Rt Breast Bx W Loc Dev 1st Lesion Img Bx Spec US Guide  Addendum Date: 11/08/2017   ADDENDUM REPORT: 11/08/2017 14:45 ADDENDUM: Pathology revealed GRADE I INVASIVE DUCTAL CARCINOMA WITH CALCIFICATIONS, DUCTAL CARCINOMA IN SITU of the Right breast, 7 o'clock. This was found to be concordant by Dr. Claudie Revering. Pathology results were discussed with the patient by telephone. The patient reported doing well after the biopsy with tenderness at the site. Post biopsy instructions and care were reviewed and questions were answered. The patient was encouraged to call [facility] for any additional concerns. The patient was referred to [facility] on November 16, 2017. The patient is scheduled for a Right breast stereotatic biopsy on Thursday November 10, 2017. Due to multiple hypoechoic right breast masses seen with ultrasound and mammography, MRI could be considered for pre treatment planning. Pathology results reported by Terie Purser, RN on 11/08/2017. Electronically Signed   By: Claudie Revering M.D.   On: 11/08/2017 14:45   Result Date: 11/08/2017 CLINICAL DATA:  5 mm mass in the 7 o'clock position of the right breast, 1 cm from the nipple. This was seen in the 6 o'clock position, 3 cm from the nipple earlier today. However, in the left posterior oblique position for the breast biopsy, this was in the 7 o'clock position, 1 cm from the nipple. There is an overlying 5 cm long obliquely oriented area of skin indentation at that location. EXAM: ULTRASOUND GUIDED RIGHT BREAST CORE NEEDLE BIOPSY COMPARISON:  Previous exam(s). FINDINGS: I met with the patient and we discussed the procedure of ultrasound-guided biopsy, including benefits and alternatives. We discussed the high likelihood of a successful procedure. We discussed the risks of the procedure, including infection, bleeding, tissue injury, clip  migration, and inadequate sampling. Informed written consent was given. The usual time-out protocol was performed immediately prior to the procedure. Lesion quadrant: Lower outer quadrant Using sterile technique and 1% Lidocaine as local anesthetic, under direct ultrasound visualization, a 14 gauge spring-loaded device was used to perform biopsy of the recently demonstrated 5 mm mass in the 7 o'clock position of the right breast using a caudal approach. At the conclusion of the procedure a ribbon shaped tissue marker clip was deployed into the biopsy cavity. Follow up 2 view mammogram was performed and dictated separately. IMPRESSION: Ultrasound guided biopsy of a 5 mm mass in the 7 o'clock position of the right breast. No apparent complications. Electronically Signed: By: Claudie Revering M.D. On: 11/07/2017 14:19      IMPRESSION: 77 year-old woman with invasive ductal carcinoma of the right breast,  grade 1, ER/PR positive, HER-2 negative.  The patient would appear to be a good candidate for breast conservation with lumpectomy. Prior to her surgery the patient will likely require an additional biopsy. She is interested in breast conserving surgery if technically possible.   Today, I talked to the patient and her son about the findings and work-up thus far. We discussed the natural history of right breast cancer and general treatment, highlighting the role of radiotherapy in the management.  We discussed the available radiation techniques, and focused on the details of logistics and delivery.  We reviewed the anticipated acute and late sequelae associated with radiation in this setting.  The patient was encouraged to ask questions that I answered to the best of my ability.  Depending on the final pathologic results the patient may or may not require radiation as part of her management. The patient will likely be seen after her surgery for further evaluation concerning this issue.  PLAN:  1. Additional breast  biopsy 2. Lumpectomy and sentinel node procedure 3. Radiation therapy to be determined after her definitive surgery 4. Aromatase inhibitor 5. Genetics August 5th at 2 pm     ------------------------------------------------  Blair Promise, PhD, MD  This document serves as a record of services personally performed by Gery Pray, MD. It was created on his behalf by Arlyce Harman, a trained medical scribe. The creation of this record is based on the scribe's personal observations and the provider's statements to them. This document has been checked and approved by the attending provider.

## 2017-11-16 NOTE — Progress Notes (Signed)
[place] NOTE  Patient Care Team: Hoyt Koch, MD as PCP - General (Internal Medicine) Rolm Bookbinder, MD as Consulting Physician (General Surgery) Nicholas Lose, MD as Consulting Physician (Hematology and Oncology) Gery Pray, MD as Consulting Physician (Radiation Oncology)  CHIEF COMPLAINTS/PURPOSE OF CONSULTATION:  Newly diagnosed breast cancer  HISTORY OF PRESENTING ILLNESS:  Laura Mendez 77 y.o. female is here because of recent diagnosis of right breast cancer.  This is a six-month follow-up to an earlier mammogram.  W mammogram showed 2 benign-appearing lesions.  On the six-month follow-up the anterior lesion was unchanged but the posterior lesion showed spiculation.  Because of this biopsy was recommended and when the did the biopsy and put the clip it appears that the biopsy of the anterior mass instead of the posterior.  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  She was presented this morning to the multidisciplinary breast board and she is here today at the [facility] clinic to discuss her treatment plan.  I reviewed her records extensively and collaborated the history with the patient.  SUMMARY OF ONCOLOGIC HISTORY:   Malignant neoplasm of lower-outer quadrant of right breast of female, estrogen receptor positive ([place])   11/10/2017 Initial Diagnosis    Six-month follow-up of right breast masses: Anterior mass unchanged and posterior mass showed spiculation.  Biopsy attempt was made on posterior mass but accidentally the anterior mass was actually biopsied.  It revealed grade 1 invasive ductal carcinoma ER 100%, PR 100%, Ki-67 1%, HER-2 negative ratio 1.77; second attempt to biopsy the posterior mass resulted in the  biopsy of an intermediate zone between the 2 masses that revealed ADH and ALH, (posterior mass still needs to be biopsied) T1 a N0 stage I a clinical stage       MEDICAL HISTORY:  Past Medical History:  Diagnosis Date  . Chronic airway obstruction, not elsewhere classified   . Diastolic dysfunction without heart failure   . Disorder of bone and cartilage, unspecified   . Paroxysmal atrial flutter ([place])   . TIA (transient ischemic attack)   . Unspecified asthma(493.90)     SURGICAL HISTORY: Past Surgical History:  Procedure Laterality Date  . ABDOMINAL HYSTERECTOMY      SOCIAL HISTORY: Social History   Socioeconomic History  . Marital status: Divorced    Spouse name: Not on file  . Number of children: Not on file  . Years of education: Not on file  . Highest education level: Not on file  Occupational History  . Occupation: retired  Scientific laboratory technician  . Financial resource strain: Not on file  . Food insecurity:    Worry: Not on file    Inability: Not on file  . Transportation needs:    Medical: Not on file    Non-medical: Not on file  Tobacco Use  . Smoking status: Former Smoker    Years: 0.00    Last attempt to quit: 05/24/1977    Years since quitting: 40.5  . Smokeless tobacco: Never Used  Substance and Sexual Activity  . Alcohol use: No  . Drug use: No  . Sexual activity: Not on file  Lifestyle  . Physical activity:    Days per week: Not on file  Minutes per session: Not on file  . Stress: Not on file  Relationships  . Social connections:    Talks on phone: Not on file    Gets together: Not on file    Attends religious service: Not on file    Active member of club or organization: Not on file    Attends meetings of clubs or organizations: Not on file    Relationship status: Not on file  . Intimate partner violence:    Fear of current or ex partner: Not on file    Emotionally abused: Not on file    Physically abused: Not on file    Forced sexual  activity: Not on file  Other Topics Concern  . Not on file  Social History Narrative  . Not on file    FAMILY HISTORY: Family History  Problem Relation Age of Onset  . COPD Father   . Cancer Mother        overian  . Kidney cancer Sister   . Lung cancer Sister   . Ovarian cancer Sister   . Liver disease Sister   . Melanoma Sister   . Breast cancer Neg Hx     ALLERGIES:  is allergic to codeine; ibuprofen; other; clarithromycin; codeine phosphate; diltiazem; tape; and verapamil hcl er.  MEDICATIONS:  Current Outpatient Medications  Medication Sig Dispense Refill  . ADVAIR DISKUS 250-50 MCG/DOSE AEPB INHALE 1 PUFF TWICE DAILY, RINSE AFTER USE 180 each 1  . albuterol (VENTOLIN HFA) 108 (90 Base) MCG/ACT inhaler INHALE 2 PUFFS EVERY 4 HOURS AS NEEDED 18 Inhaler 3  . cholecalciferol (VITAMIN D) 1000 units tablet Take 1,000 Units by mouth daily.    . fish oil-omega-3 fatty acids 1000 MG capsule Take 2 g by mouth daily.      Marland Kitchen MEGARED OMEGA-3 KRILL OIL 500 MG CAPS Take 1 capsule by mouth daily.    . mometasone (ELOCON) 0.1 % cream Apply 1 application topically daily.    . Multiple Vitamin (MULTIVITAMIN) tablet Take 1 tablet by mouth daily.     Alveda Reasons 20 MG TABS tablet TAKE 1 TABLET BY MOUTH EVERY DAY WITH SUPPER 30 tablet 6  . letrozole (FEMARA) 2.5 MG tablet Take 1 tablet (2.5 mg total) by mouth daily. 90 tablet 3   Current Facility-Administered Medications  Medication Dose Route Frequency Provider Last Rate Last Dose  . Mepolizumab SOLR 100 mg  100 mg Subcutaneous Q28 days Annamaria Boots, Clinton D, MD   100 mg at 09/16/17 1412    REVIEW OF SYSTEMS:   Constitutional: Denies fevers, chills or abnormal night sweats Eyes: Denies blurriness of vision, double vision or watery eyes Ears, nose, mouth, throat, and face: Denies mucositis or sore throat Respiratory: Denies cough, dyspnea or wheezes Cardiovascular: Denies palpitation, chest discomfort or lower extremity  swelling Gastrointestinal:  Denies nausea, heartburn or change in bowel habits Skin: Denies abnormal skin rashes Lymphatics: Denies new lymphadenopathy or easy bruising Neurological:Denies numbness, tingling or new weaknesses Behavioral/Psych: Mood is stable, no new changes  Breast: Extensive bruising and bleeding from prior biopsies All other systems were reviewed with the patient and are negative.  PHYSICAL EXAMINATION: ECOG PERFORMANCE STATUS: 1 - Symptomatic but completely ambulatory  Vitals:   11/16/17 0827  BP: 134/70  Pulse: 63  Resp: 17  Temp: 97.9 F (36.6 C)  SpO2: 94%   Filed Weights   11/16/17 0827  Weight: 211 lb 11.2 oz (96 kg)    GENERAL:alert, no distress and comfortable SKIN: skin  color, texture, turgor are normal, no rashes or significant lesions EYES: normal, conjunctiva are pink and non-injected, sclera clear OROPHARYNX:no exudate, no erythema and lips, buccal mucosa, and tongue normal  NECK: supple, thyroid normal size, non-tender, without nodularity LYMPH:  no palpable lymphadenopathy in the cervical, axillary or inguinal LUNGS: clear to auscultation and percussion with normal breathing effort HEART: regular rate & rhythm and no murmurs and no lower extremity edema ABDOMEN:abdomen soft, non-tender and normal bowel sounds Musculoskeletal:no cyanosis of digits and no clubbing  PSYCH: alert & oriented x 3 with fluent speech NEURO: no focal motor/sensory deficits Breast: Extensive bruising and bleeding from prior biopsies  LABORATORY DATA:  I have reviewed the data as listed Lab Results  Component Value Date   WBC 7.9 11/16/2017   HGB 14.3 11/16/2017   HCT 42.8 11/16/2017   MCV 90.7 11/16/2017   PLT 234 11/16/2017   Lab Results  Component Value Date   NA 141 11/16/2017   K 4.0 11/16/2017   CL 104 11/16/2017   CO2 27 11/16/2017    RADIOGRAPHIC STUDIES: I have personally reviewed the radiological reports and agreed with the findings in the  report.  ASSESSMENT AND PLAN:  Malignant neoplasm of lower-outer quadrant of right breast of female, estrogen receptor positive ([place]) 11/07/2017: Six-month follow-up of right breast masses: Anterior mass unchanged and posterior mass showed spiculation.  Biopsy attempt was made on posterior mass but accidentally the anterior mass was actually biopsied.  It revealed grade 1 invasive ductal carcinoma ER 100%, PR 100%, Ki-67 1%, HER-2 negative ratio 1.77; second attempt to biopsy the posterior mass resulted in the biopsy of an intermediate zone between the 2 masses that revealed ADH and ALH, (posterior mass still needs to be biopsied) T1 a N0 stage I a clinical stage  Pathology and radiology counseling:Discussed with the patient, the details of pathology including the type of breast cancer,the clinical staging, the significance of ER, PR and HER-2/neu receptors and the implications for treatment. After reviewing the pathology in detail, we proceeded to discuss the different treatment options between surgery, radiation, chemotherapy, antiestrogen therapies.  Recommendations: Biopsy of the posterior mass needs to be performed. Because of extensive bruising and bleeding from previous biopsies, we may have to postpone this repeat biopsy for at least 1 to 2 months. Because of this I recommended starting her on antiestrogen therapy with letrozole 2.5 mg daily starting today.  1. Breast conserving surgery followed by 2. +/- Adjuvant radiation therapy followed by 4. Adjuvant antiestrogen therapy  with letrozole 2.5 mg daily   Return to clinic after surgery to discuss final pathology report.   All questions were answered. The patient knows to call the clinic with any problems, questions or concerns.    Harriette Ohara, MD 11/16/17

## 2017-11-16 NOTE — Patient Instructions (Signed)

## 2017-11-16 NOTE — Therapy (Signed)
[place], [place], [address] Phone: [phone]   Fax:  [phone]  Physical Therapy Evaluation  Patient Details  Name: Laura Mendez MRN: [id number] Date of Birth: 12-Apr-1941 Referring Provider: Dr. Rolm Bookbinder   Encounter Date: 11/16/2017  PT End of Session - 11/16/17 1332    Visit Number  1    Number of Visits  2    Date for PT Re-Evaluation  01/11/18    PT Start Time  0955    PT Stop Time  1018    PT Time Calculation (min)  23 min    Activity Tolerance  Patient tolerated treatment well    Behavior During Therapy  [facility] for tasks assessed/performed       Past Medical History:  Diagnosis Date  . Chronic airway obstruction, not elsewhere classified   . Diastolic dysfunction without heart failure   . Disorder of bone and cartilage, unspecified   . Paroxysmal atrial flutter ([place])   . TIA (transient ischemic attack)   . Unspecified asthma(493.90)     Past Surgical History:  Procedure Laterality Date  . ABDOMINAL HYSTERECTOMY      There were no vitals filed for this visit.   Subjective Assessment - 11/16/17 1255    Subjective  Patient reports she is here today to be seen by her medical team for her newly diagnosed right breast cancer.    Pertinent History  Patient was diagnosed on 11/07/17 with grade I invasive ductal carcinoma breast cancer. It measures 5 mm and is located in the lower outer quadrant. It is ER/PR positive and HER2 negative with a Ki67 of 1%.     Patient Stated Goals  Reduce lymphedema risk and learn post op shoulder ROM HEP    Currently in Pain?  No/denies         [facility] PT Assessment - 11/16/17 0001      Assessment   Medical Diagnosis  Right breast cancer    Referring Provider  Dr. Rolm Bookbinder    Onset Date/Surgical Date  11/07/17    Hand Dominance  Right    Prior Therapy  none      Precautions   Precautions  Other (comment)    Precaution Comments   active cancer      Restrictions   Weight Bearing Restrictions  No      Balance Screen   Has the patient fallen in the past 6 months  No    Has the patient had a decrease in activity level because of a fear of falling?   No    Is the patient reluctant to leave their home because of a fear of falling?   No      Home Environment   Living Environment  Private residence    Living Arrangements  Alone    Available Help at Discharge  Family      Prior Function   Level of [place]  Retired    Leisure  She goes to [employer] 3-4x/week and uses the treadmill and bike for 30 min total      Cognition   Overall Cognitive Status  Within Functional Limits for tasks assessed      Posture/Postural Control   Posture/Postural Control  Postural limitations    Postural Limitations  Rounded Shoulders;Forward head      ROM / Strength   AROM / PROM / Strength  AROM;Strength  AROM   AROM Assessment Site  Shoulder;Cervical    Right/Left Shoulder  Right;Left    Right Shoulder Extension  46 Degrees    Right Shoulder Flexion  165 Degrees    Right Shoulder ABduction  148 Degrees    Right Shoulder Internal Rotation  81 Degrees    Right Shoulder External Rotation  72 Degrees    Left Shoulder Extension  46 Degrees    Left Shoulder Flexion  150 Degrees    Left Shoulder ABduction  160 Degrees    Left Shoulder Internal Rotation  79 Degrees    Left Shoulder External Rotation  76 Degrees    Cervical Flexion  WNL    Cervical Extension  WNL    Cervical - Right Side Bend  WNL    Cervical - Left Side Bend  WNL    Cervical - Right Rotation  WNL    Cervical - Left Rotation  WNL      Strength   Overall Strength  Within functional limits for tasks performed        LYMPHEDEMA/ONCOLOGY QUESTIONNAIRE - 11/16/17 1324      Type   Cancer Type  Right breast cancer      Lymphedema Assessments   Lymphedema Assessments  Upper extremities      Right Upper Extremity  Lymphedema   10 cm Proximal to Olecranon Process  25.6 cm    Olecranon Process  24.9 cm    10 cm Proximal to Ulnar Styloid Process  21.4 cm    Just Proximal to Ulnar Styloid Process  15.5 cm    Across Hand at Thumb Web Space  18.7 cm    At Base of 2nd Digit  6 cm      Left Upper Extremity Lymphedema   10 cm Proximal to Olecranon Process  28.3 cm    Olecranon Process  25.2 cm    10 cm Proximal to Ulnar Styloid Process  21.2 cm    Just Proximal to Ulnar Styloid Process  15.1 cm    Across Hand at Thumb Web Space  18.6 cm    At Base of 2nd Digit  5.6 cm             Objective measurements completed on examination: See above findings.     Patient was instructed today in a home exercise program today for post op shoulder range of motion. These included active assist shoulder flexion in sitting, scapular retraction, wall walking with shoulder abduction, and hands behind head external rotation.  She was encouraged to do these twice a day, holding 3 seconds and repeating 5 times when permitted by her physician.      PT Education - 11/16/17 1332    Education Details  Lymphedema risk reduction and post op shoulder ROM HEP    Person(s) Educated  Patient    Methods  Explanation;Demonstration;Handout    Comprehension  Returned demonstration;Verbalized understanding          PT Long Term Goals - 11/16/17 1352      PT LONG TERM GOAL #1   Title  Patient will demonstrate she has returned to baseline related to shoulder ROM and function.    Time  8    Period  Weeks    Status  New      Breast Clinic Goals - 11/16/17 1352      Patient will be able to verbalize understanding of pertinent lymphedema risk reduction practices relevant to her diagnosis specifically related to skin   care.   Time  1    Period  Days    Status  Achieved      Patient will be able to return demonstrate and/or verbalize understanding of the post-op home exercise program related to regaining shoulder range of  motion.   Time  1    Period  Days    Status  Achieved      Patient will be able to verbalize understanding of the importance of attending the postoperative After Breast Cancer Class for further lymphedema risk reduction education and therapeutic exercise.   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 It measures 5 mm and is located in the lower outer quadrant. It is ER/PR positive and HER2 negative with a Ki67 of 1%. Her multidisciplinary medical team met prior to her assessments to determine a recommended treatment plan. She is planning to have a right lumpectomy and sentinel node biopsy followed by radiation and anti-estrogen therapy. She will benefit from a post op PT visit to determine needs.    Clinical Presentation  Stable    Clinical Decision Making  Low    Rehab Potential  Excellent    Clinical Impairments Affecting Rehab Potential  None    PT Frequency  -- eval and 1 f/u visit    PT Treatment/Interventions  ADLs/Self Care Home Management;Therapeutic exercise;Patient/family education    PT Next Visit Plan  Will reassess 3-4 weeks post op to determine needs    PT Home Exercise Plan  Post op shoulder ROM HEP    Consulted and Agree with Plan of Care  Patient    Family Member Consulted  --       Patient will benefit from skilled therapeutic intervention in order to improve the following deficits and impairments:  Decreased knowledge of precautions, Impaired UE functional use, Decreased range of motion, Postural dysfunction, Pain  Visit Diagnosis: Malignant neoplasm of lower-outer quadrant of right breast of female, estrogen receptor positive ([facility]) - Plan: PT plan of care cert/re-cert  Abnormal posture - Plan: PT plan of care cert/re-cert   Patient will follow up at outpatient cancer rehab 3-4 weeks following surgery.  If  the patient requires physical therapy at that time, a specific plan will be dictated and sent to the referring physician for approval. The patient was educated today on appropriate basic range of motion exercises to begin post operatively and the importance of attending the After Breast Cancer class following surgery.  Patient was educated today on lymphedema risk reduction practices as it pertains to recommendations that will benefit the patient immediately following surgery.  She verbalized good understanding.      Problem List Patient Active Problem List   Diagnosis Date Noted  . Malignant neoplasm of lower-outer quadrant of right breast of female, estrogen receptor positive ([facility]) 11/15/2017  . Diastolic dysfunction 04/28/2017  . Rash and nonspecific skin eruption 04/01/2016  . Paroxysmal atrial flutter ([facility]) 01/13/2016  . Routine general medical examination at a health care facility 01/13/2016  . Retinal vein occlusion 06/16/2015  . Seasonal and perennial allergic rhinitis 08/06/2011  . 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 Osteopenia 03/31/2007    Marti Cooper , PT 11/16/17 1:54 PM  [facility] [address] [place], [place], [address] Phone: [phone]   Fax:  [phone]  Name: Amaurie C Kolasa MRN: [id number] Date of Birth: 08/23/1940  

## 2017-11-16 NOTE — Assessment & Plan Note (Signed)
11/07/2017: Six-month follow-up of right breast masses: Anterior mass unchanged and posterior mass showed spiculation.  Biopsy attempt was made on posterior mass but accidentally the anterior mass was actually biopsied.  It revealed grade 1 invasive ductal carcinoma ER 100%, PR 100%, Ki-67 1%, HER-2 negative ratio 1.77; second attempt to biopsy the posterior mass resulted in the biopsy of an intermediate zone between the 2 masses that revealed ADH and ALH, (posterior mass still needs to be biopsied) T1 a N0 stage I a clinical stage  Pathology and radiology counseling:Discussed with the patient, the details of pathology including the type of breast cancer,the clinical staging, the significance of ER, PR and HER-2/neu receptors and the implications for treatment. After reviewing the pathology in detail, we proceeded to discuss the different treatment options between surgery, radiation, chemotherapy, antiestrogen therapies.  Recommendations: Biopsy of the posterior mass needs to be performed. 1. Breast conserving surgery followed by 2. Oncotype DX testing to determine if chemotherapy would be of any benefit (depending on final pathology results) 3. Adjuvant radiation therapy followed by 4. Adjuvant antiestrogen therapy  Oncotype counseling: I discussed Oncotype DX test. I explained to the patient that this is a 21 gene panel to evaluate patient tumors DNA to calculate recurrence score. This would help determine whether patient has high risk or intermediate risk or low risk breast cancer. She understands that if her tumor was found to be high risk, she would benefit from systemic chemotherapy. If low risk, no need of chemotherapy. If she was found to be intermediate risk, we would need to evaluate the score as well as other risk factors and determine if an abbreviated chemotherapy may be of benefit.  Return to clinic after surgery to discuss final pathology report and then determine if Oncotype DX testing  will need to be sent.

## 2017-11-18 ENCOUNTER — Telehealth: Payer: Self-pay

## 2017-11-18 ENCOUNTER — Ambulatory Visit (INDEPENDENT_AMBULATORY_CARE_PROVIDER_SITE_OTHER): Payer: Medicare Other

## 2017-11-18 DIAGNOSIS — J4550 Severe persistent asthma, uncomplicated: Secondary | ICD-10-CM

## 2017-11-18 NOTE — Telephone Encounter (Signed)
SENT REFERRAL TO SCHEDULING AND FILED NOTES 

## 2017-11-21 MED ORDER — MEPOLIZUMAB 100 MG ~~LOC~~ SOLR
100.0000 mg | SUBCUTANEOUS | Status: DC
  Administered 2017-11-18: 100 mg via SUBCUTANEOUS

## 2017-11-21 NOTE — Progress Notes (Signed)
Documentation of medication administration and charges of [place] have been completed by Desmond Dike, CMA based on the [facility] documentation sheet completed by Alroy Bailiff.

## 2017-11-22 ENCOUNTER — Other Ambulatory Visit: Payer: Self-pay | Admitting: General Surgery

## 2017-11-22 DIAGNOSIS — N63 Unspecified lump in unspecified breast: Secondary | ICD-10-CM

## 2017-11-23 ENCOUNTER — Telehealth: Payer: Self-pay | Admitting: *Deleted

## 2017-11-23 NOTE — Telephone Encounter (Signed)
  Oncology Nurse Navigator Documentation  Navigator Location: [facility] (11/23/17 1500)   )Navigator Encounter Type: Telephone;[facility]C Follow-up (11/23/17 1500) Telephone: Outgoing Call;Clinic/[facility]C Follow-up (11/23/17 1500)       Genetic Counseling Date: 12/26/17 (11/23/17 1500) Genetic Counseling Type: Non-Urgent (11/23/17 1500)                                        Time Spent with Patient: 15 (11/23/17 1500)

## 2017-12-08 DIAGNOSIS — C50411 Malignant neoplasm of upper-outer quadrant of right female breast: Secondary | ICD-10-CM | POA: Diagnosis not present

## 2017-12-12 ENCOUNTER — Ambulatory Visit
Admission: RE | Admit: 2017-12-12 | Discharge: 2017-12-12 | Disposition: A | Discharge status: Home / Self Care | Payer: Medicare Other | Source: Ambulatory Visit | Attending: General Surgery | Admitting: General Surgery

## 2017-12-12 DIAGNOSIS — N6313 Unspecified lump in the right breast, lower outer quadrant: Secondary | ICD-10-CM | POA: Diagnosis not present

## 2017-12-12 DIAGNOSIS — N63 Unspecified lump in unspecified breast: Secondary | ICD-10-CM

## 2017-12-12 DIAGNOSIS — N6011 Diffuse cystic mastopathy of right breast: Secondary | ICD-10-CM | POA: Diagnosis not present

## 2017-12-15 ENCOUNTER — Other Ambulatory Visit: Payer: Self-pay | Admitting: General Surgery

## 2017-12-15 DIAGNOSIS — C50411 Malignant neoplasm of upper-outer quadrant of right female breast: Secondary | ICD-10-CM | POA: Diagnosis not present

## 2017-12-15 DIAGNOSIS — Z170 Estrogen receptor positive status [ER+]: Principal | ICD-10-CM

## 2017-12-16 ENCOUNTER — Other Ambulatory Visit: Payer: Self-pay | Admitting: *Deleted

## 2017-12-16 DIAGNOSIS — Z170 Estrogen receptor positive status [ER+]: Principal | ICD-10-CM

## 2017-12-16 DIAGNOSIS — C50511 Malignant neoplasm of lower-outer quadrant of right female breast: Secondary | ICD-10-CM

## 2017-12-19 ENCOUNTER — Ambulatory Visit (INDEPENDENT_AMBULATORY_CARE_PROVIDER_SITE_OTHER): Payer: Medicare Other

## 2017-12-19 ENCOUNTER — Encounter: Payer: Self-pay | Admitting: Radiation Oncology

## 2017-12-19 DIAGNOSIS — J4550 Severe persistent asthma, uncomplicated: Secondary | ICD-10-CM | POA: Diagnosis not present

## 2017-12-20 ENCOUNTER — Ambulatory Visit (INDEPENDENT_AMBULATORY_CARE_PROVIDER_SITE_OTHER)
Admission: RE | Admit: 2017-12-20 | Discharge: 2017-12-20 | Disposition: A | Discharge status: Home / Self Care | Payer: Medicare Other | Source: Ambulatory Visit | Attending: Internal Medicine | Admitting: Internal Medicine

## 2017-12-20 ENCOUNTER — Other Ambulatory Visit: Payer: Self-pay

## 2017-12-20 ENCOUNTER — Encounter: Payer: Self-pay | Admitting: Internal Medicine

## 2017-12-20 ENCOUNTER — Encounter (HOSPITAL_COMMUNITY): Payer: Self-pay | Admitting: Nurse Practitioner

## 2017-12-20 ENCOUNTER — Ambulatory Visit: Payer: Medicare Other | Admitting: Internal Medicine

## 2017-12-20 ENCOUNTER — Ambulatory Visit (HOSPITAL_COMMUNITY)
Admission: RE | Admit: 2017-12-20 | Discharge: 2017-12-20 | Disposition: A | Discharge status: Home / Self Care | Payer: Medicare Other | Source: Ambulatory Visit | Attending: Nurse Practitioner | Admitting: Nurse Practitioner

## 2017-12-20 VITALS — BP 134/78 | HR 70

## 2017-12-20 VITALS — BP 132/80 | HR 64 | Ht 70.0 in | Wt 208.8 lb

## 2017-12-20 DIAGNOSIS — Z8673 Personal history of transient ischemic attack (TIA), and cerebral infarction without residual deficits: Secondary | ICD-10-CM | POA: Insufficient documentation

## 2017-12-20 DIAGNOSIS — Z885 Allergy status to narcotic agent status: Secondary | ICD-10-CM | POA: Diagnosis not present

## 2017-12-20 DIAGNOSIS — Z79899 Other long term (current) drug therapy: Secondary | ICD-10-CM | POA: Insufficient documentation

## 2017-12-20 DIAGNOSIS — J449 Chronic obstructive pulmonary disease, unspecified: Secondary | ICD-10-CM

## 2017-12-20 DIAGNOSIS — J4550 Severe persistent asthma, uncomplicated: Secondary | ICD-10-CM | POA: Diagnosis not present

## 2017-12-20 DIAGNOSIS — Z87891 Personal history of nicotine dependence: Secondary | ICD-10-CM | POA: Diagnosis not present

## 2017-12-20 DIAGNOSIS — I4892 Unspecified atrial flutter: Secondary | ICD-10-CM | POA: Diagnosis not present

## 2017-12-20 DIAGNOSIS — R21 Rash and other nonspecific skin eruption: Secondary | ICD-10-CM

## 2017-12-20 DIAGNOSIS — Z888 Allergy status to other drugs, medicaments and biological substances status: Secondary | ICD-10-CM | POA: Diagnosis not present

## 2017-12-20 DIAGNOSIS — Z886 Allergy status to analgesic agent status: Secondary | ICD-10-CM | POA: Diagnosis not present

## 2017-12-20 DIAGNOSIS — I483 Typical atrial flutter: Secondary | ICD-10-CM | POA: Insufficient documentation

## 2017-12-20 DIAGNOSIS — R05 Cough: Secondary | ICD-10-CM | POA: Diagnosis not present

## 2017-12-20 IMAGING — DX DG CHEST 2V
2 series · 2 of 2 positions shown · non-contrast
Comparison: Chest x-ray of [DATE]

CLINICAL DATA: Three month history of cough and shortness of
breath. History of asthma, COPD, and atrial fibrillation. Former
smoker.

EXAM:
CHEST - 2 VIEW

[chest pa]
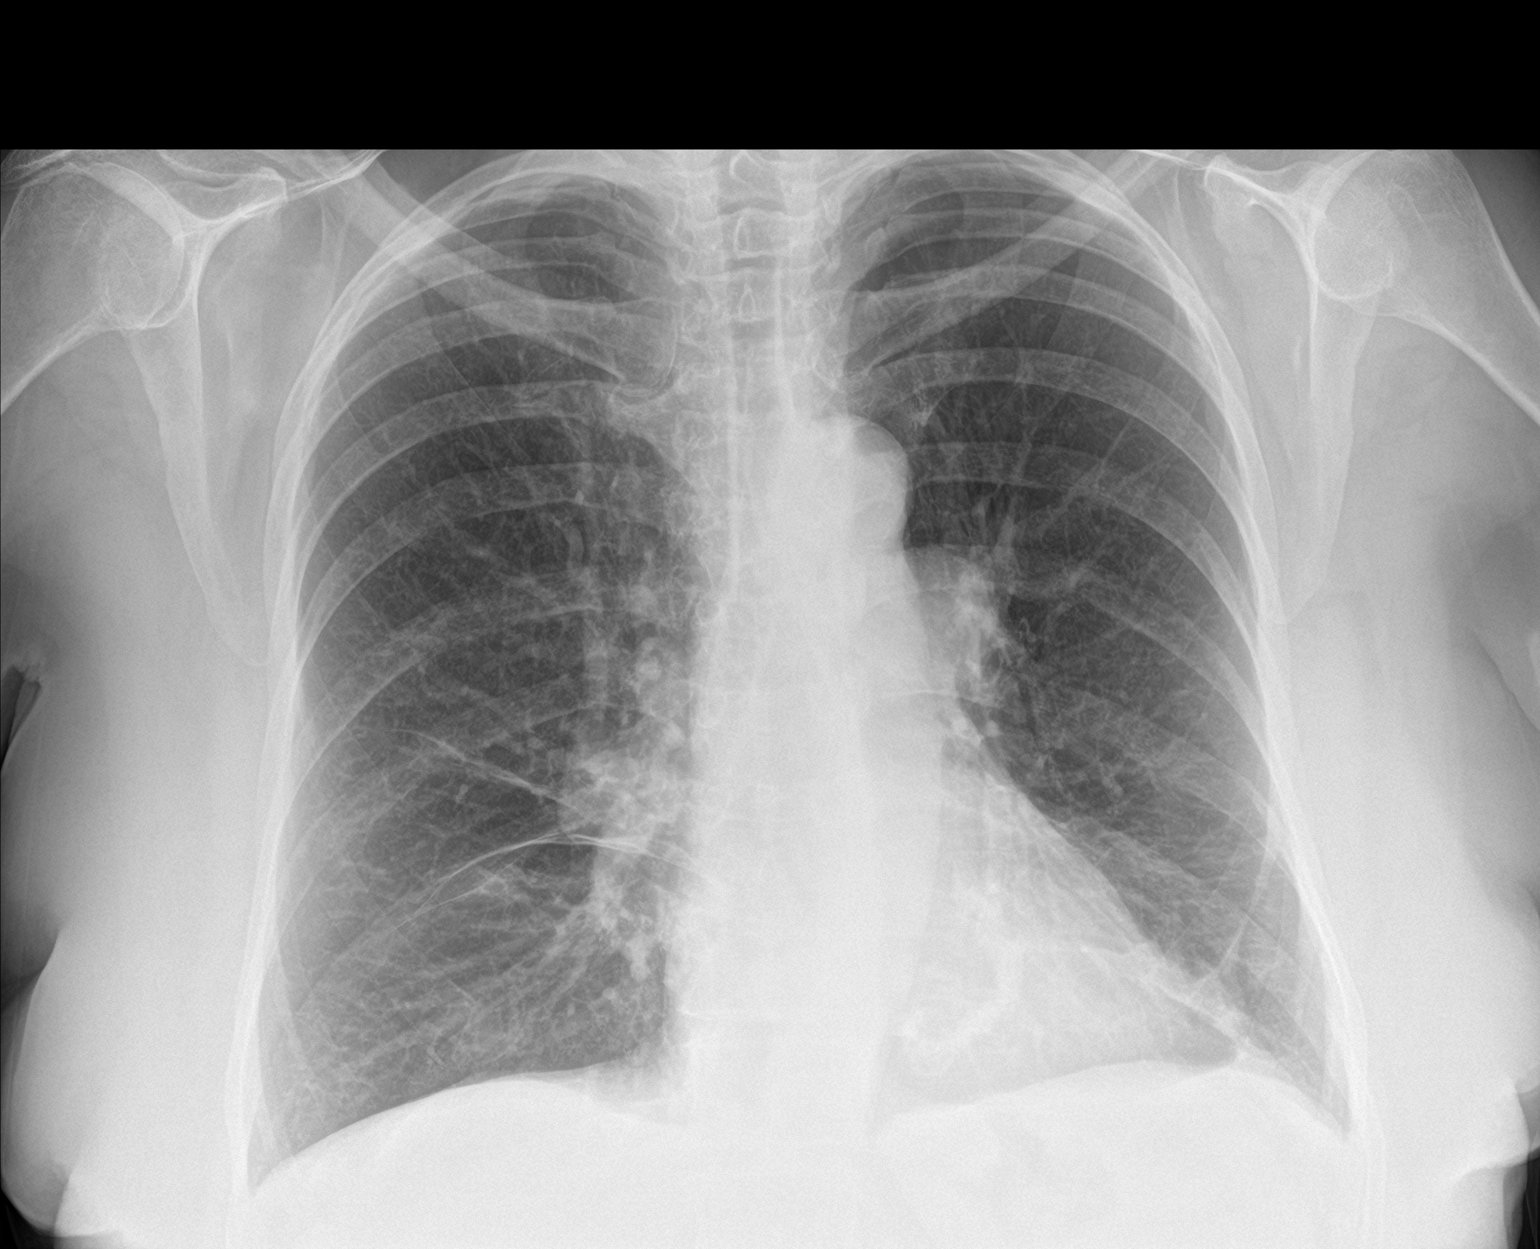

[chest lat]
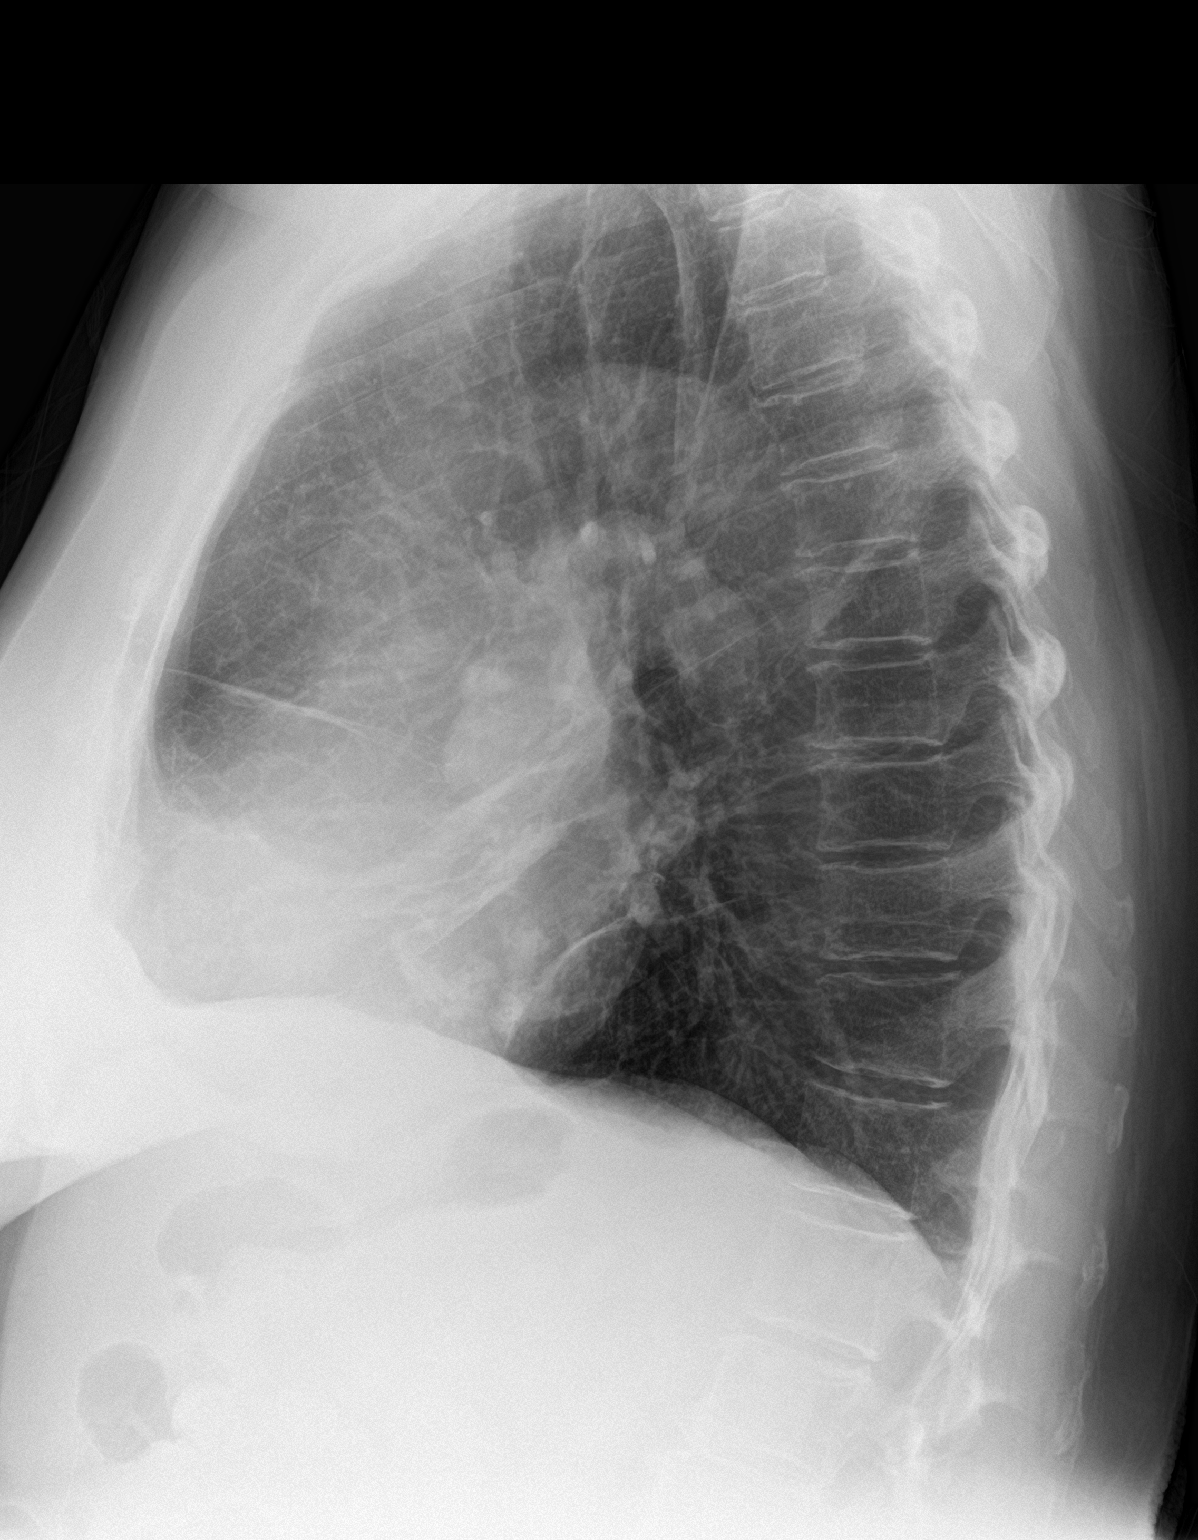

[2 of 2 positions shown; findings below may reference images not displayed]

FINDINGS: The lungs remain hyperinflated with hemidiaphragm flattening. There
is stable scarring in the right infrahilar region anteriorly and at
the left lung base anteriorly. There is no pleural effusion or
pneumothorax. The heart and pulmonary vascularity are normal. There
is calcification of the mitral valvular annulus and in the wall of
the aortic arch. The bony thorax exhibits no acute abnormality.
IMPRESSION: COPD. Stable pleuroparenchymal scarring. No acute pneumonia nor CHF.

Mitral annular calcification. Calcification in the wall of the
aortic arch.

## 2017-12-20 MED ORDER — MEPOLIZUMAB 100 MG ~~LOC~~ SOLR
100.0000 mg | SUBCUTANEOUS | Status: AC
Start: 2017-12-19 — End: ?
  Administered 2017-12-20: 100 mg via SUBCUTANEOUS

## 2017-12-20 NOTE — Progress Notes (Signed)
Patient ID: TYE JUAREZ, female   DOB: 1940-10-25, 77 y.o.   MRN: [id number]     Primary Care Physician: Hoyt Koch, MD Referring Physician: ER f/u   YSELA HETTINGER is a 77 y.o. female with a h/o new onset a flutter, 11/2015,that was associated with left chest and left arm pain, dx in the ER, 12/11/15, with f/u in the afib clinic. She was laying in bed reading a romance novel when her left sided CP began. Pt states that she had an intermittent cramping sensation to her chest that radiated to her left arm.  Pt notes that she was recently placed on prednisone taper and received steriod shot  for a rash of unknown etiology to her arms and trunk and she is mid way in her taper.  Pt reports that she has been evaluated by two dermatologists for her rash. Denies any new supplements or caffeine use. She states that she is having associated symptoms of palpitations. Pt notes that she has had a flutter sensation in the past.  She denies leg swelling, diaphoresis, nausea, and any other symptoms. Denies hx of thyroid issues. 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  She did convert on her own without cardioversion after receiving 5 mg lopressor and was given rx for metoprolol She reports that she has severe extrinsic asthma and was severly symptomatic for years until she started taking Xolair injections every two weeks. With this information, I am hesitate to for her to take  BB's, prescribed in the ER for rate control and will change to CCB. She can stop plavix and with a chadvasc score of 5, and discussion of choice of anticoagulants, opts for Xarelto daily.   She returns to afib clinic 8/28 for f/u echo. She has not had any further arrhythmia. Doing well on xarelto and can stop asa that she takes on her own. EF was normal and left atrium severly dilated at 55 mm. Severely calcified annulus. She has lost 8 lbs and saw PCP recently with  normal labs.  F/u in afib clinic 12/24/16. I have seen in over one year. She is followed by Dr.Skains. She is being seen for a rash that started shortly after I stopped seeing her. She recently had Cardizem stopped and started on verapamil yesterday for the rash. She woke up this am to more itching. Her pulmonologist stopped her lung injections x one month without improvement in symptoms. She feels it is the xarelto or CCB. She saw a dermatologist without improvement in symptoms or a clear diagnosis.   F/u in the afib clinic. 06/22/17. She continues to have a rash but it does not appear to be related to drugs and she has actually been started on methotrexate by dermatology. 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    F/u in afib clinic, 7/30. Since I saw her last, she has been diagnosed with breast CA and is pending a lumpectomy 8/9. Otherwise, she has not noted any irregular HR.   Today, she denies symptoms of palpitations, chest pain, shortness of breath, orthopnea, PND, lower extremity edema, dizziness, presyncope, syncope, or neurologic sequela. The patient is tolerating medications without difficulties and is otherwise without complaint today.   Past Medical History:  Diagnosis Date  . Chronic airway obstruction,  not elsewhere classified   . Diastolic dysfunction without heart failure   . Disorder of bone and cartilage, unspecified   . Paroxysmal atrial flutter ([place])   . TIA (transient ischemic attack)   . Unspecified asthma(493.90)    Past Surgical History:  Procedure Laterality Date  . ABDOMINAL HYSTERECTOMY      Current Outpatient Medications  Medication Sig Dispense Refill  . ADVAIR DISKUS 250-50 MCG/DOSE AEPB INHALE 1 PUFF TWICE DAILY, RINSE AFTER USE (Patient taking differently: INHALE 1 PUFF ONCE DAILY IN THE MORNING, RINSE AFTER USE) 180 each 1  . albuterol  (VENTOLIN HFA) 108 (90 Base) MCG/ACT inhaler INHALE 2 PUFFS EVERY 4 HOURS AS NEEDED (Patient taking differently: INHALE 2 PUFFS EVERY 4 HOURS AS NEEDED WHEEZING/SHORTNESS OF BREATH) 18 Inhaler 3  . cholecalciferol (VITAMIN D) 1000 units tablet Take 1,000 Units by mouth daily.    . clobetasol ointment (TEMOVATE) 2.70 % Apply 1 application topically 2 (two) times daily as needed (for skin irritation).    . fish oil-omega-3 fatty acids 1000 MG capsule Take 1 g by mouth daily.     Marland Kitchen letrozole (FEMARA) 2.5 MG tablet Take 1 tablet (2.5 mg total) by mouth daily. (Patient taking differently: Take 2.5 mg by mouth at bedtime. ) 90 tablet 3  . MEGARED OMEGA-3 KRILL OIL 500 MG CAPS Take 500 mg by mouth daily.     . Multiple Vitamin (MULTIVITAMIN) tablet Take 1 tablet by mouth daily.     Marland Kitchen NUCALA 100 MG/ML SOAJ Inject 100 mg into the skin every 28 (twenty-eight) days.    Marland Kitchen triamcinolone ointment (KENALOG) 0.1 % Apply 1 application topically 2 (two) times daily as needed (for itching).    Alveda Reasons 20 MG TABS tablet TAKE 1 TABLET BY MOUTH EVERY DAY WITH SUPPER (Patient taking differently: TAKE 1 TABLET BY MOUTH EVERY DAY) 30 tablet 6   No current facility-administered medications for this encounter.     Allergies  Allergen Reactions  . Clarithromycin Anaphylaxis    "just about killed me"  . Codeine Nausea Only  . Ibuprofen Hives  . Codeine Phosphate Nausea Only  . Diltiazem Rash  . Tape Itching and Other (See Comments)    Tears skin  . Verapamil Hcl Er Rash    Social History   Socioeconomic History  . Marital status: Divorced    Spouse name: Not on file  . Number of children: Not on file  . Years of education: Not on file  . Highest education level: Not on file  Occupational History  . Occupation: retired  Scientific laboratory technician  . Financial resource strain: Not on file  . Food insecurity:    Worry: Not on file    Inability: Not on file  . Transportation needs:    Medical: Not on file     Non-medical: Not on file  Tobacco Use  . Smoking status: Former Smoker    Years: 0.00    Last attempt to quit: 05/24/1977    Years since quitting: 40.6  . Smokeless tobacco: Never Used  Substance and Sexual Activity  . Alcohol use: No  . Drug use: No  . Sexual activity: Not on file  Lifestyle  . Physical activity:    Days per week: Not on file    Minutes per session: Not on file  . Stress: Not on file  Relationships  . Social connections:    Talks on phone: Not on file    Gets together: Not on file  Attends religious service: Not on file    Active member of club or organization: Not on file    Attends meetings of clubs or organizations: Not on file    Relationship status: Not on file  . Intimate partner violence:    Fear of current or ex partner: Not on file    Emotionally abused: Not on file    Physically abused: Not on file    Forced sexual activity: Not on file  Other Topics Concern  . Not on file  Social History Narrative  . Not on file    Family History  Problem Relation Age of Onset  . COPD Father   . Cancer Mother        overian  . Kidney cancer Sister   . Lung cancer Sister   . Ovarian cancer Sister   . Liver disease Sister   . Melanoma Sister   . Breast cancer Neg Hx     ROS- All systems are reviewed and negative except as per the HPI above  Physical Exam: Vitals:   12/20/17 1055  BP: 134/78  Pulse: 70    GEN- The patient is well appearing, alert and oriented x 3 today.   Head- normocephalic, atraumatic Eyes-  Sclera clear, conjunctiva pink Ears- hearing intact Oropharynx- clear Neck- supple, no JVP Lymph- no cervical lymphadenopathy Lungs- Clear to ausculation bilaterally, normal work of breathing Heart- Regular rate and rhythm, no murmurs, rubs or gallops, PMI not laterally displaced GI- soft, NT, ND, + BS Extremities- no clubbing, cyanosis, or edema, fine rash present on arms/lower legs. MS- no significant deformity or atrophy Skin- no  rash or lesion Psych- euthymic mood, full affect Neuro- strength and sensation are intact  EKG- Sinus rhythm at 70 bpm with PAC's  Epic records reviewed   Echo-Left ventricle: The cavity size was mildly dilated. Wall   thickness was increased in a pattern of mild LVH. Systolic   function was normal. The estimated ejection fraction was in the   range of 55% to 60%. Wall motion was normal; there were no   regional wall motion abnormalities. Doppler parameters are   consistent with abnormal left ventricular relaxation (grade 1   diastolic dysfunction). Doppler parameters are consistent with   high ventricular filling pressure. - Mitral valve: Severely calcified annulus. There was mild   regurgitation. Valve area by continuity equation (using LVOT   flow): 2.36 cm^2. - Left atrium: The atrium was severely dilated. - Right ventricle: The cavity size was normal. Wall thickness was   normal. Systolic function was normal. - Right atrium: The atrium was mildly dilated. - Atrial septum: No defect or patent foramen ovale was identified   by color flow Doppler. - Tricuspid valve: There was trivial regurgitation. - Pulmonary arteries: Systolic pressure was within the normal   range. PA peak pressure: 24 mm Hg (S).  Assessment and Plan;  1.Typical atrial flutter x one episode in 2017 Quiet   If typical flutter returns off CCB, may refer for flutter ablation  2. Chadsvasc score of 5 Switched from plavix/asa for remote blood clot to left eye(2013) to xarelto for flutter episode Continue xarelto 20 mg a day    F/u 6 months  [name] C. Emy Angevine, [facility] [address] [place], [place] [address] [phone]

## 2017-12-20 NOTE — Progress Notes (Signed)
Patient ID: Laura Mendez, female    DOB: 24-Feb-1941, 77 y.o.   MRN: [id number]  HPI F former smoker followed for Severe asthma/ COPD FEV1 62%,  complicated by allergic rhinitis,  paroxysmal A. Fib Labs 11/23/16- allergy profile total IgE 409 on Xolair, elevated specific IgE for dust mite and cockroach. EOS 3,300, Nl ANA and alpha-gal Derm Path report reviewed- Spongiotic/ eczema process- does not describe it as urticaria. Xolair was changed to [facility] June, 2018, to see if Xolair was causing her rash-no improvement after 5 months Office Spirometry 12/20/2017-very severe obstructive airways disease.  FVC 2.09/60%, FEV1 0.90/34%, ratio 0.43 ---------------------------------------------------------------.  06/22/17- 77 year old female former smoker followed for severe asthma/COPD/Xolair, complicated by allergic rhinitis, paroxysmal A. Fib, rash ----Asthma: Pt continues to get Xolair injections and patient is doing well overall. Pt is starting to have issues with allergies as well. Ventolin HFA, Xolair, methotrexate, Advair 250,       Changing now from Xolair to [employer] with pruritic rash now over one year-[facility], with biopsies.  It did not change with 1 month off of Xolair and has not responded so far to methotrexate.  Has been on Xolair now for 7 years with excellent control.  Chest symptoms began to exacerbate during the month off of Xolair.  We have talked about trying to switch her to another product, like Nucala, as another way of seeing if the Xolair has anything to do with her rash. Auditory exacerbations.  Going to the gym 3 or 4 times a week for stationary bicycle and treadmill.  Little routine cough.  12/20/2017- 77 year old female former smoker followed for severe asthma/COPD/Nucala, complicated by allergic rhinitis, paroxysmal A. Fib, rash ----Asthma with COPD: Pt is having SOB and coughing more. Pt continues to take Xolair Injections  monthly but seems to "run out: about the 3rd week after injecting.  Surgery on 12-30-17 on Right Breast and about 4-5 days prior to she will have radioactive seeds placed in 3 spots in her breast. Dr, Donne Hazel She was switched from Xolair to [facility] over 4 months ago because of persistent, difficult rash being followed by [facility] dermatology.  Rash has not changed.  Improvement after each Nucala injection lasts about 3 weeks.  Has noted occasional dry cough over the last 2 months possibly related to weather-managed with cough drops and sips of liquids.  No severe wheezing but continues with her inhaled meds. CXR 06/22/2017- IMPRESSION: Hyperinflation and bilateral scarring. No acute superimposed Process. Office Spirometry 12/20/2017-very severe obstructive airways disease.  FVC 2.09/60%, FEV1 0.90/34%, ratio 0.43  Review of Systems-see HPI   + = positive Constitutional:   No-   weight loss, night sweats, fevers, chills, fatigue, lassitude. HEENT:   No-  headaches, difficulty swallowing, tooth/dental problems, sore throat,       No-  sneezing, itching, ear ache,  nasal congestion, +post nasal drip,  CV:  No-   chest pain, orthopnea, PND, swelling in lower extremities, anasarca, dizziness, palpitations Resp:  + shortness of breath with exertion or at rest- unchanged             No-   productive cough,  + non-productive cough,  No-  coughing up of blood.              No-   change in color of mucus.  + wheezing.   Skin:  +rash or lesions. GI:  No-   heartburn, indigestion, abdominal pain, nausea, vomiting, GU:  MS:  +  joint pain or swelling.   Neuro- nothing unusual  Psych:  No- change in mood or affect. No depression or anxiety.  No memory loss  Objective:     General- Alert, Oriented, Affect-appropriate, Distress- none acute, Pleasant, + Overweight Skin- + Faint blotchy rash on arms, back and ankles Lymphadenopathy- none Head- atraumatic            Eyes- Gross vision intact, PERRLA,  conjunctivae clear secretions            Ears- Hearing, canals normal            Nose- Mild turbinate edema, No-Septal dev, mucus, polyps, erosion, perforation             Throat- Mallampati II , mucosa -not red, drainage- none, tonsils- atrophic Neck- flexible , trachea midline, no stridor , thyroid nl, carotid no bruit Chest - symmetrical excursion , unlabored           Heart/CV- RRR +, no murmur , no gallop  , no rub, nl s1 s2                           - JVD- none , edema- none, stasis changes- none, varices- none           Lung- +clear to P&A/ distant/unlabored, wheeze- none, cough-none, dullness-none, rub- none           Chest wall-  Abd-  Br/ Gen/ Rectal- Not done, not indicated Extrem- cyanosis- none, clubbing, none, atrophy- none, strength- nl Neuro- grossly intact to observation

## 2017-12-20 NOTE — Assessment & Plan Note (Signed)
Rash remains unchanged now after several months off of Xolair, replaced with Nucala.

## 2017-12-20 NOTE — Patient Instructions (Signed)
Order- CXR   Dx Asthma COPD overlap syndrome  Order- Office Spirometry    Good luck with your surgery

## 2017-12-20 NOTE — Assessment & Plan Note (Signed)
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  We will continue Nucala for now, but reconsider after her breast surgery.  Plan-office spirometry done.  Update CXR.  Pulmonary consultation will be available if needed at time of surgery.

## 2017-12-20 NOTE — Progress Notes (Signed)
Documentation of medication administration and charges of Nucala have been completed by Desmond Dike, CMA based on the [facility] documentation sheet completed by Alroy Bailiff.

## 2017-12-21 NOTE — Pre-Procedure Instructions (Signed)
TAMASHA LAPLANTE  12/21/2017      [id number] [address] [place] [address] Phone: [phone] Fax: [phone]    Your procedure is scheduled on  Friday  12/30/17  Report to [facility] Admitting at 530 A.M.  Call this number if you have problems the morning of surgery:  [phone]   Remember:  Do not eat or drink after midnight.  You may drink clear liquids until 430AM .  Clear liquids allowed are:                    Water, Juice (non-citric and without pulp), Carbonated beverages, Clear Tea, Black Coffee only, Plain Jell-O only, Gatorade and Plain Popsicles only DRINK PRE SURGERY ENSURE DRINK BY 430 AM. (3 HOURS PRIOR TO SURGERY)    Take these medicines the morning of surgery with A SIP OF WATER - ADVAIR INHALER, ALBUTEROL INHALER (BRING WITH YOU)  7 days prior to surgery STOP taking any Aspirin(unless otherwise instructed by your surgeon), Aleve, Naproxen, Ibuprofen, Motrin, Advil, Goody's, BC's, all herbal medications, fish oil, and all vitamins  FOLLOW YOUR DOCTORS INSTRUCTIONS REGARDING XARELTO.    Do not wear jewelry, make-up or nail polish.  Do not wear lotions, powders, or perfumes, or deodorant.  Do not shave 48 hours prior to surgery.  Men may shave face and neck.  Do not bring valuables to the hospital.  [facility] is not responsible for any belongings or valuables.  Contacts, dentures or bridgework may not be worn into surgery.  Leave your suitcase in the car.  After surgery it may be brought to your room.  For patients admitted to the hospital, discharge time will be determined by your treatment team.  Patients discharged the day of surgery will not be allowed to drive home.   Name and phone number of your driver:    Special instructions:  [facility] - Preparing for Surgery  Before surgery, you can play an important role.  Because skin is not sterile, your skin needs to be as free of germs as  possible.  You can reduce the number of germs on you skin by washing with CHG (chlorahexidine gluconate) soap before surgery.  CHG is an antiseptic cleaner which kills germs and bonds with the skin to continue killing germs even after washing.  Oral Hygiene is also important in reducing the risk of infection.  Remember to brush your teeth with your regular toothpaste the morning of surgery.  Please DO NOT use if you have an allergy to CHG or antibacterial soaps.  If your skin becomes reddened/irritated stop using the CHG and inform your nurse when you arrive at Short Stay.  Do not shave (including legs and underarms) for at least 48 hours prior to the first CHG shower.  You may shave your face.  Please follow these instructions carefully:   1.  Shower with CHG Soap the night before surgery and the morning of Surgery.  2.  If you choose to wash your hair, wash your hair first as usual with your normal shampoo.  3.  After you shampoo, rinse your hair and body thoroughly to remove the shampoo. 4.  Use CHG as you would any other liquid soap.  You can apply chg directly to the skin and wash gently with a      scrungie or washcloth.           5.  Apply  the CHG Soap to your body ONLY FROM THE NECK DOWN.   Do not use on open wounds or open sores. Avoid contact with your eyes, ears, mouth and genitals (private parts).  Wash genitals (private parts) with your normal soap.  6.  Wash thoroughly, paying special attention to the area where your surgery will be performed.  7.  Thoroughly rinse your body with warm water from the neck down.  8.  DO NOT shower/wash with your normal soap after using and rinsing off the CHG Soap.  9.  Pat yourself dry with a clean towel.            10.  Wear clean pajamas.            11.  Place clean sheets on your bed the night of your first shower and do not sleep with pets.  Day of Surgery  Do not apply any lotions/deoderants the morning of surgery.   Please wear clean  clothes to the hospital/surgery center. Remember to brush your teeth with toothpaste.     Please read over the following fact sheets that you were given. Pain Booklet and Surgical Site Infection Prevention

## 2017-12-22 ENCOUNTER — Encounter (HOSPITAL_COMMUNITY): Payer: Self-pay

## 2017-12-22 ENCOUNTER — Other Ambulatory Visit: Payer: Self-pay

## 2017-12-22 ENCOUNTER — Encounter (HOSPITAL_COMMUNITY)
Admission: RE | Admit: 2017-12-22 | Discharge: 2017-12-22 | Disposition: A | Discharge status: Home / Self Care | Payer: Medicare Other | Source: Ambulatory Visit | Attending: General Surgery | Admitting: General Surgery

## 2017-12-22 DIAGNOSIS — Z8051 Family history of malignant neoplasm of kidney: Secondary | ICD-10-CM | POA: Insufficient documentation

## 2017-12-22 DIAGNOSIS — Z01818 Encounter for other preprocedural examination: Secondary | ICD-10-CM | POA: Insufficient documentation

## 2017-12-22 DIAGNOSIS — C50511 Malignant neoplasm of lower-outer quadrant of right female breast: Secondary | ICD-10-CM | POA: Diagnosis not present

## 2017-12-22 DIAGNOSIS — Z800 Family history of malignant neoplasm of digestive organs: Secondary | ICD-10-CM | POA: Insufficient documentation

## 2017-12-22 DIAGNOSIS — Z8041 Family history of malignant neoplasm of ovary: Secondary | ICD-10-CM | POA: Diagnosis not present

## 2017-12-22 DIAGNOSIS — Z170 Estrogen receptor positive status [ER+]: Secondary | ICD-10-CM | POA: Diagnosis not present

## 2017-12-22 HISTORY — DX: Cardiac arrhythmia, unspecified: I49.9

## 2017-12-22 HISTORY — DX: Cerebral infarction, unspecified: I63.9

## 2017-12-22 HISTORY — DX: Unspecified asthma, uncomplicated: J45.909

## 2017-12-22 LAB — BASIC METABOLIC PANEL WITH GFR
Anion gap: 11 (ref 5–15)
BUN: 10 mg/dL (ref 8–23)
CO2: 25 mmol/L (ref 22–32)
Calcium: 10 mg/dL (ref 8.9–10.3)
Chloride: 105 mmol/L (ref 98–111)
Creatinine, Ser: 0.81 mg/dL (ref 0.44–1.00)
GFR calc Af Amer: >60 mL/min
GFR calc non Af Amer: >60 mL/min
Glucose, Bld: 89 mg/dL (ref 70–99)
Potassium: 3.9 mmol/L (ref 3.5–5.1)
Sodium: 141 mmol/L (ref 135–145)

## 2017-12-22 LAB — CBC
HCT: 46.5 % — ABNORMAL HIGH (ref 36.0–46.0)
Hemoglobin: 14.4 g/dL (ref 12.0–15.0)
MCH: 29.6 pg (ref 26.0–34.0)
MCHC: 31 g/dL (ref 30.0–36.0)
MCV: 95.5 fL (ref 78.0–100.0)
Platelets: 260 10*3/uL (ref 150–400)
RBC: 4.87 MIL/uL (ref 3.87–5.11)
RDW: 13.9 % (ref 11.5–15.5)
WBC: 7.8 10*3/uL (ref 4.0–10.5)

## 2017-12-22 NOTE — Progress Notes (Addendum)
PCP  Pricilla Holm  Dr. Cristal Generous office called about when to stop xarelto, pt. Instructed to stop 2 days prior to surgery.  Pt. Has hives and red rash over most of body,states that she has has this for years and seen dermatologist at [facility]. She has been told that there is no treatment Skin is very thin and fragile, she does not want to use CHG due to sensitive skin. She says she will shower with Dial soap the night before and the morning of surgery.   A-Fib Clinic last visit 12-20-2017  Last OV with Dr. Annamaria Boots 12-20-2017

## 2017-12-23 ENCOUNTER — Encounter: Payer: Self-pay | Admitting: *Deleted

## 2017-12-23 NOTE — Progress Notes (Signed)
Anesthesia Chart Review:  Case:  [id number] Date/Time:  12/30/17 0715   Procedure:  BREAST LUMPECTOMY WITH RADIOACTIVE SEED LOCALIZATION X'S 3 (Right Breast)   Anesthesia type:  General   Pre-op diagnosis:  RIGHT BREAST CANCER   Location:  [facility]OM 02 / [facility]   Surgeon:  Rolm Bookbinder, MD      DISCUSSION: 77 yo female former smoker for above procedure. Pertinent hx includes, Severe persistent asthma/COPD, TIA, Paroxysmal Afib/flutter, Diffuse rash that has been evaled by dermatology per path report Spongiotic/ eczema process.  Per pulmonology Dr. 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  We will continue Nucala for now, but reconsider after her breast surgery.  Plan-office spirometry done.  Update CXR.  Pulmonary consultation will be available if needed at time of surgery."  Per PAT nursing notes Dr. Donne Hazel instructed pt to stop Xarelto 2d prior to surgery.  Anticipate she can proceed with surgery as planned barring acute status change.  VS: BP (!) 144/98   Pulse 64   Temp 36.8 C (Oral)   Resp 18   Ht 5\' 10"  (1.778 m)   Wt 209 lb 5 oz (94.9 kg)   SpO2 96%   BMI 30.03 kg/m   PROVIDERS: Hoyt Koch, MD is PCP  Baird Lyons is Pulmonologst last seen 12/20/2017  Candee Furbish, MD is Cardiologist last seen in office by Cecilie Kicks, NP on 11/01/2014  Pt follows with Afib clinic for management of anticoagulation, last seen by Roderic Palau NP 12/20/2017.  LABS: Labs reviewed: Acceptable for surgery. (all labs ordered are listed, but only abnormal results are displayed)  Labs Reviewed  CBC - Abnormal; Notable for the following components:      Result Value   HCT 46.5 (*)    All other components within normal limits  BASIC METABOLIC PANEL     IMAGES: CHEST - 2 VIEW 12/20/2017  COMPARISON:  Chest x-ray of June 22, 2017  FINDINGS: The lungs remain hyperinflated with hemidiaphragm  flattening. There is stable scarring in the right infrahilar region anteriorly and at the left lung base anteriorly. There is no pleural effusion or pneumothorax. The heart and pulmonary vascularity are normal. There is calcification of the mitral valvular annulus and in the wall of the aortic arch. The bony thorax exhibits no acute abnormality.  IMPRESSION: COPD. Stable pleuroparenchymal scarring. No acute pneumonia nor CHF.  Mitral annular calcification. Calcification in the wall of the aortic arch.   EKG: 12/20/2017: Sinus rhythm with Premature atrial complexes. Nonspecific ST and T wave abnormality. When compared with ECG of 06/22/17 No significant change   CV: Echo 12/29/2015: Study Conclusions  - Left ventricle: The cavity size was mildly dilated. Wall   thickness was increased in a pattern of mild LVH. Systolic   function was normal. The estimated ejection fraction was in the   range of 55% to 60%. Wall motion was normal; there were no   regional wall motion abnormalities. Doppler parameters are   consistent with abnormal left ventricular relaxation (grade 1   diastolic dysfunction). Doppler parameters are consistent with   high ventricular filling pressure. - Mitral valve: Severely calcified annulus. There was mild   regurgitation. Valve area by continuity equation (using LVOT   flow): 2.36 cm^2. - Left atrium: The atrium was severely dilated. - Right ventricle: The cavity size was normal. Wall thickness was   normal. Systolic function was normal. - Right atrium: The atrium was mildly  dilated. - Atrial septum: No defect or patent foramen ovale was identified   by color flow Doppler. - Tricuspid valve: There was trivial regurgitation. - Pulmonary arteries: Systolic pressure was within the normal   range. PA peak pressure: 24 mm Hg (S). Past Medical History:  Diagnosis Date  . Asthma   . Chronic airway obstruction, not elsewhere classified   . Diastolic dysfunction  without heart failure   . Disorder of bone and cartilage, unspecified   . Dysrhythmia    A-Fib  . Paroxysmal atrial flutter ([place])   . Stroke [facility])    TIA  . TIA (transient ischemic attack)   . Unspecified asthma(493.90)     Past Surgical History:  Procedure Laterality Date  . ABDOMINAL HYSTERECTOMY    . BUNIONECTOMY Bilateral   . EYE SURGERY Bilateral    cataract removal    MEDICATIONS: . ADVAIR DISKUS 250-50 MCG/DOSE AEPB  . albuterol (VENTOLIN HFA) 108 (90 Base) MCG/ACT inhaler  . cholecalciferol (VITAMIN D) 1000 units tablet  . clobetasol ointment (TEMOVATE) 0.05 %  . fish oil-omega-3 fatty acids 1000 MG capsule  . letrozole (FEMARA) 2.5 MG tablet  . MEGARED OMEGA-3 KRILL OIL 500 MG CAPS  . Multiple Vitamin (MULTIVITAMIN) tablet  . NUCALA 100 MG/ML SOAJ  . triamcinolone ointment (KENALOG) 0.1 %  . XARELTO 20 MG TABS tablet   . Mepolizumab SOLR 100 mg     Wynonia Musty [facility] Short Stay Center/Anesthesiology Phone [phone] 12/23/2017 2:02 PM

## 2017-12-26 ENCOUNTER — Inpatient Hospital Stay: Payer: Medicare Other | Attending: Hematology and Oncology | Admitting: Genetic Counselor

## 2017-12-26 ENCOUNTER — Inpatient Hospital Stay: Payer: Medicare Other

## 2017-12-26 DIAGNOSIS — Z800 Family history of malignant neoplasm of digestive organs: Secondary | ICD-10-CM | POA: Diagnosis not present

## 2017-12-26 DIAGNOSIS — Z8051 Family history of malignant neoplasm of kidney: Secondary | ICD-10-CM

## 2017-12-26 DIAGNOSIS — Z170 Estrogen receptor positive status [ER+]: Secondary | ICD-10-CM

## 2017-12-26 DIAGNOSIS — Z8041 Family history of malignant neoplasm of ovary: Secondary | ICD-10-CM | POA: Diagnosis not present

## 2017-12-26 DIAGNOSIS — Z1379 Encounter for other screening for genetic and chromosomal anomalies: Secondary | ICD-10-CM | POA: Diagnosis not present

## 2017-12-26 DIAGNOSIS — Z7901 Long term (current) use of anticoagulants: Secondary | ICD-10-CM | POA: Insufficient documentation

## 2017-12-26 DIAGNOSIS — C50511 Malignant neoplasm of lower-outer quadrant of right female breast: Secondary | ICD-10-CM

## 2017-12-26 DIAGNOSIS — Z79899 Other long term (current) drug therapy: Secondary | ICD-10-CM | POA: Insufficient documentation

## 2017-12-26 DIAGNOSIS — Z86010 Personal history of colonic polyps: Secondary | ICD-10-CM | POA: Diagnosis not present

## 2017-12-26 DIAGNOSIS — Z79811 Long term (current) use of aromatase inhibitors: Secondary | ICD-10-CM

## 2017-12-27 ENCOUNTER — Encounter: Payer: Self-pay | Admitting: Genetic Counselor

## 2017-12-27 DIAGNOSIS — Z8041 Family history of malignant neoplasm of ovary: Secondary | ICD-10-CM | POA: Insufficient documentation

## 2017-12-27 DIAGNOSIS — Z8051 Family history of malignant neoplasm of kidney: Secondary | ICD-10-CM | POA: Insufficient documentation

## 2017-12-27 DIAGNOSIS — Z800 Family history of malignant neoplasm of digestive organs: Secondary | ICD-10-CM | POA: Insufficient documentation

## 2017-12-27 NOTE — Progress Notes (Signed)
REFERRING PROVIDER: Chauncey Cruel, MD [address] [place], [place] [address]  PRIMARY PROVIDER:  Hoyt Koch, MD  PRIMARY REASON FOR VISIT:  1. Malignant neoplasm of lower-outer quadrant of right breast of female, estrogen receptor positive ([place])   2. Family history of ovarian cancer   3. Family history of colon cancer   4. Family history of kidney cancer      HISTORY OF PRESENT ILLNESS:   Laura Mendez, a 77 y.o. female, was seen for a [facility] cancer genetics consultation at the request of Dr. Jana Hakim due to a personal and family history of cancer.  Laura Mendez presents to clinic today to discuss the possibility of a hereditary predisposition to cancer, genetic testing, and to further clarify her future cancer risks, as well as potential cancer risks for family members.   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 This was treated with lumpectomy.  The remainder of her treatment will be determined after that.. She reports that she has had many colonoscopies, with her schedule being between annually to every 5 years.  She reports having at least 2-3 polyps removed at each colonoscopy.   CANCER HISTORY:    Malignant neoplasm of lower-outer quadrant of right breast of female, estrogen receptor positive ([place])   11/10/2017 Initial Diagnosis    Six-month follow-up of right breast masses: Anterior mass unchanged and posterior mass showed spiculation.  Biopsy attempt was made on posterior mass but accidentally the anterior mass was actually biopsied.  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    First live birth at age 21.  OCP use for approximately 10 years.  Ovaries intact: no.  Hysterectomy: yes.  Menopausal status: postmenopausal.  HRT use: 5-8 years. Colonoscopy: yes; 20+ polyps. Mammogram within the last year: yes. Number of breast biopsies: 1. Up to date with pelvic exams:  n/a. Any excessive radiation exposure in the past:  no  Past Medical History:  Diagnosis Date  . Asthma   . Chronic airway obstruction, not elsewhere classified   . Diastolic dysfunction without heart failure   . Disorder of bone and cartilage, unspecified   . Dysrhythmia    A-Fib  . Family history of colon cancer   . Family history of kidney cancer   . Family history of ovarian cancer   . Paroxysmal atrial flutter ([place])   . Stroke [facility])    TIA  . TIA (transient ischemic attack)   . Unspecified asthma(493.90)     Past Surgical History:  Procedure Laterality Date  . ABDOMINAL HYSTERECTOMY    . BUNIONECTOMY Bilateral   . EYE SURGERY Bilateral    cataract removal    Social History   Socioeconomic History  . Marital status: Divorced    Spouse name: Not on file  . Number of children: Not on file  . Years of education: Not on file  . Highest education level: Not on file  Occupational History  . Occupation: retired  Scientific laboratory technician  . Financial resource strain: Not on file  . Food insecurity:  Worry: Not on file    Inability: Not on file  . Transportation needs:    Medical: Not on file    Non-medical: Not on file  Tobacco Use  . Smoking status: Former Smoker    Years: 0.00    Last attempt to quit: 05/24/1977    Years since quitting: 40.6  . Smokeless tobacco: Never Used  Substance and Sexual Activity  . Alcohol use: No  . Drug use: No  . Sexual activity: Not on file  Lifestyle  . Physical activity:    Days per week: Not on file    Minutes per session: Not on file  . Stress: Not on file  Relationships  . Social connections:    Talks on phone: Not on file    Gets  together: Not on file    Attends religious service: Not on file    Active member of club or organization: Not on file    Attends meetings of clubs or organizations: Not on file    Relationship status: Not on file  Other Topics Concern  . Not on file  Social History Narrative  . Not on file     FAMILY HISTORY:  We obtained a detailed, 4-generation family history.  Significant diagnoses are listed below: Family History  Problem Relation Age of Onset  . COPD Father   . Colon cancer Mother 69       mets to pancreas and liver  . Kidney cancer Sister 94       d. 57  . Lung cancer Sister        d. 71, smoker  . Ovarian cancer Sister 40       d. 73  . Cirrhosis Sister        d. 68  . Melanoma Sister 37  . Ovarian cancer Maternal Grandmother        d. 75  . Heart disease Maternal Grandfather        rheumatic heart disease  . Rectal cancer Paternal Grandfather        d. 71  . Parkinson's disease Brother   . Ovarian cancer Maternal Aunt   . Colon cancer Maternal Uncle        d. 72-73  . Heart attack Paternal Uncle        d. 51  . Kidney cancer Brother 52  . Colon cancer Maternal Uncle        d. 52  . Cancer Other        MGMs pat 1/2 sister with uterine and rectal cancer  . Colon cancer Cousin        d. 16 - mat first cousin  . Breast cancer Neg Hx     The patient has two sons, three grandsons and one granddaughter, who are all cancer free.  She has five sisters and three brothers.  One sister and brother had kidney cancer, one sister had lung cancer one sister had ovarian cancer and one sister had melanoma.  Both parents are deceased.    The patient's mother was diagnosed with colon cancer and died at 39.  She had two brothers and tow sisters.  One sister died at 102, one sister had ovarian cancer, and both brothers had colon cancer.  One brother had one son who also had colon cancer and died in his 57's.  The maternal grandparents are deceased.  The grandmother had ovarian  cancer.  She had a paternal 1/2 sister with uterine and rectal cancer.  The patients father died of COPD.  He had one brother and four sisters, who were all cancer free.  The paternal grandfather died of rectal cancer.    Ms. Ciaramitaro is unaware of previous family history of genetic testing for hereditary cancer risks. Patient's maternal ancestors are of [place] descent, and paternal ancestors are of [place] descent. There is no reported Ashkenazi Jewish ancestry. There is no known consanguinity.  GENETIC COUNSELING ASSESSMENT: AYESHIA COPPIN is a 77 y.o. female with a personal and family history of cancer which is somewhat suggestive of a Lynch syndrome and predisposition to cancer. We, therefore, discussed and recommended the following at today's visit.   DISCUSSION: We discussed that about 5-10% of breast cancer is hereditary with most cases due to BRCA mutations.  Based on her personal and family history of breast and ovarian cancer, it is appropriate to test for BRCA mutations. However the family history of early onset colon cancer and urinary tract cancers make [place] suspicious that here family may have Lynch syndrome.  We discussed that in some families, breast cancer is associated with the condition.  The fact that she has had so many colon polyps removed simply indicates that she may have had polys removed, that if they were not, would have become cancerous.    We reviewed the characteristics, features and inheritance patterns of hereditary cancer syndromes. We also discussed genetic testing, including the appropriate family members to test, the process of testing, insurance coverage and turn-around-time for results. We discussed the implications of a negative, positive and/or variant of uncertain significant result. We recommended Ms. Haggard pursue genetic testing for the Multi-cancer gene panel. The Multi-Gene Panel offered by [facility] includes sequencing and/or deletion duplication testing of the  following 84 genes: AIP, ALK, APC, ATM, AXIN2,BAP1,  BARD1, BLM, BMPR1A, BRCA1, BRCA2, BRIP1, CASR, CDC73, CDH1, CDK4, CDKN1B, CDKN1C, CDKN2A (p14ARF), CDKN2A (p16INK4a), CEBPA, CHEK2, CTNNA1, DICER1, DIS3L2, EGFR (c.2369C>T, p.Thr790Met variant only), EPCAM (Deletion/duplication testing only), FH, FLCN, GATA2, GPC3, GREM1 (Promoter region deletion/duplication testing only), HOXB13 (c.251G>A, p.Gly84Glu), HRAS, KIT, MAX, MEN1, MET, MITF (c.952G>A, p.Glu318Lys variant only), MLH1, MSH2, MSH3, MSH6, MUTYH, NBN, NF1, NF2, NTHL1, PALB2, PDGFRA, PHOX2B, PMS2, POLD1, POLE, POT1, PRKAR1A, PTCH1, PTEN, RAD50, RAD51C, RAD51D, RB1, RECQL4, RET, RUNX1, SDHAF2, SDHA (sequence changes only), SDHB, SDHC, SDHD, SMAD4, SMARCA4, SMARCB1, SMARCE1, STK11, SUFU, TERT, TERT, TMEM127, TP53, TSC1, TSC2, VHL, WRN and WT1.    Based on Ms. Mace's personal and family history of cancer, she meets medical criteria for genetic testing. Despite that she meets criteria, she may still have an out of pocket cost. We discussed that if her out of pocket cost for testing is over $100, the laboratory will call and confirm whether she wants to proceed with testing.  If the out of pocket cost of testing is less than $100 she will be billed by the genetic testing laboratory.   In order to estimate her chance of having a MMR mutation associated with Lynch syndrome, we used statistical models (PREMM1,2,6) and laboratory data that take into account her personal medical history, family history and ancestry.  Because each model is different, there can be a lot of variability in the risks they give.  Therefore, these numbers must be considered a rough range and not a precise risk of having a MMR mutation.  These models estimate that she has approximately a 1.3% chance of having a mutation.   This model does not take into consideration Ms. Ciulla breast cancer or her colon  polyps.  If we include her breast cancer, her risk increases to 4.0%  This latter  risk is high enough to recommend genetic testing.  PLAN: After considering the risks, benefits, and limitations, Ms. Carrero  provided informed consent to pursue genetic testing and the blood sample was sent to [facility] for analysis of the Multi cancer panel. Results should be available within approximately 2-3 weeks' time, at which point they will be disclosed by telephone to Ms. Renstrom, as will any additional recommendations warranted by these results. Ms. Pho will receive a summary of her genetic counseling visit and a copy of her results once available. This information will also be available in Epic. We encouraged Ms. Fifer to remain in contact with cancer genetics annually so that we can continuously update the family history and inform her of any changes in cancer genetics and testing that may be of benefit for her family. Ms. Swanner questions were answered to her satisfaction today. Our contact information was provided should additional questions or concerns arise.  Lastly, we encouraged Ms. Stauber to remain in contact with cancer genetics annually so that we can continuously update the family history and inform her of any changes in cancer genetics and testing that may be of benefit for this family.   Ms.  Reh questions were answered to her satisfaction today. Our contact information was provided should additional questions or concerns arise. Thank you for the referral and allowing [place] to share in the care of your patient.   Isabella Ida P. Florene Glen, [place], [facility] Certified Genetic Counselor [email or web address] phone: [phone]  The patient was seen for a total of 60 minutes in face-to-face genetic counseling.  This patient was discussed with Drs. Magrinat, Lindi Adie and/or Burr Medico who agrees with the above.    _______________________________________________________________________ For Office Staff:  Number of people involved in session: 1 Was an Intern/ student involved  with case: no

## 2017-12-29 ENCOUNTER — Telehealth: Payer: Self-pay | Admitting: Internal Medicine

## 2017-12-29 ENCOUNTER — Ambulatory Visit
Admission: RE | Admit: 2017-12-29 | Discharge: 2017-12-29 | Disposition: A | Discharge status: Home / Self Care | Payer: Medicare Other | Source: Ambulatory Visit | Attending: General Surgery | Admitting: General Surgery

## 2017-12-29 DIAGNOSIS — Z170 Estrogen receptor positive status [ER+]: Principal | ICD-10-CM

## 2017-12-29 DIAGNOSIS — C50411 Malignant neoplasm of upper-outer quadrant of right female breast: Secondary | ICD-10-CM

## 2017-12-29 DIAGNOSIS — R928 Other abnormal and inconclusive findings on diagnostic imaging of breast: Secondary | ICD-10-CM | POA: Diagnosis not present

## 2017-12-29 NOTE — Telephone Encounter (Signed)
Pt is calling back [phone] 

## 2017-12-29 NOTE — Telephone Encounter (Signed)
Called and spoke to patient. Gave patient results per Dr. Annamaria Boots. Patient verbalized understanding and has no questions at this time. Nothing further needed.

## 2017-12-29 NOTE — Telephone Encounter (Signed)
Attempted to call patient today regarding results. I did not receive an answer at time of call. I have left a voicemail message for pt to return call. X1  

## 2017-12-30 ENCOUNTER — Ambulatory Visit (HOSPITAL_COMMUNITY)
Admission: RE | Admit: 2017-12-30 | Discharge: 2017-12-30 | Disposition: A | Discharge status: Home / Self Care | Payer: Medicare Other | Source: Ambulatory Visit | Attending: General Surgery | Admitting: General Surgery

## 2017-12-30 ENCOUNTER — Encounter (HOSPITAL_COMMUNITY): Payer: Self-pay | Admitting: Certified Registered Nurse Anesthetist

## 2017-12-30 ENCOUNTER — Ambulatory Visit (HOSPITAL_COMMUNITY): Payer: Medicare Other | Admitting: Anesthesiology

## 2017-12-30 ENCOUNTER — Ambulatory Visit
Admission: RE | Admit: 2017-12-30 | Discharge: 2017-12-30 | Disposition: A | Discharge status: Home / Self Care | Payer: Medicare Other | Source: Ambulatory Visit | Attending: General Surgery | Admitting: General Surgery

## 2017-12-30 ENCOUNTER — Ambulatory Visit (HOSPITAL_COMMUNITY): Payer: Medicare Other | Admitting: Physician Assistant

## 2017-12-30 ENCOUNTER — Ambulatory Visit: Payer: Medicare Other

## 2017-12-30 ENCOUNTER — Encounter (HOSPITAL_COMMUNITY)
Admission: RE | Disposition: A | Discharge status: Home / Self Care | Payer: Self-pay | Source: Ambulatory Visit | Attending: General Surgery

## 2017-12-30 DIAGNOSIS — Z79899 Other long term (current) drug therapy: Secondary | ICD-10-CM | POA: Diagnosis not present

## 2017-12-30 DIAGNOSIS — C50511 Malignant neoplasm of lower-outer quadrant of right female breast: Secondary | ICD-10-CM | POA: Diagnosis not present

## 2017-12-30 DIAGNOSIS — R928 Other abnormal and inconclusive findings on diagnostic imaging of breast: Secondary | ICD-10-CM | POA: Diagnosis not present

## 2017-12-30 DIAGNOSIS — I4891 Unspecified atrial fibrillation: Secondary | ICD-10-CM | POA: Diagnosis not present

## 2017-12-30 DIAGNOSIS — J449 Chronic obstructive pulmonary disease, unspecified: Secondary | ICD-10-CM | POA: Insufficient documentation

## 2017-12-30 DIAGNOSIS — Z886 Allergy status to analgesic agent status: Secondary | ICD-10-CM | POA: Insufficient documentation

## 2017-12-30 DIAGNOSIS — C50411 Malignant neoplasm of upper-outer quadrant of right female breast: Secondary | ICD-10-CM

## 2017-12-30 DIAGNOSIS — Z87891 Personal history of nicotine dependence: Secondary | ICD-10-CM | POA: Insufficient documentation

## 2017-12-30 DIAGNOSIS — Z170 Estrogen receptor positive status [ER+]: Principal | ICD-10-CM

## 2017-12-30 DIAGNOSIS — Z888 Allergy status to other drugs, medicaments and biological substances status: Secondary | ICD-10-CM | POA: Insufficient documentation

## 2017-12-30 DIAGNOSIS — Z885 Allergy status to narcotic agent status: Secondary | ICD-10-CM | POA: Insufficient documentation

## 2017-12-30 DIAGNOSIS — N6489 Other specified disorders of breast: Secondary | ICD-10-CM | POA: Diagnosis not present

## 2017-12-30 DIAGNOSIS — I4892 Unspecified atrial flutter: Secondary | ICD-10-CM | POA: Insufficient documentation

## 2017-12-30 DIAGNOSIS — Z7901 Long term (current) use of anticoagulants: Secondary | ICD-10-CM | POA: Diagnosis not present

## 2017-12-30 DIAGNOSIS — Z881 Allergy status to other antibiotic agents status: Secondary | ICD-10-CM | POA: Insufficient documentation

## 2017-12-30 DIAGNOSIS — Z8041 Family history of malignant neoplasm of ovary: Secondary | ICD-10-CM | POA: Diagnosis not present

## 2017-12-30 DIAGNOSIS — Z8673 Personal history of transient ischemic attack (TIA), and cerebral infarction without residual deficits: Secondary | ICD-10-CM | POA: Diagnosis not present

## 2017-12-30 DIAGNOSIS — J45909 Unspecified asthma, uncomplicated: Secondary | ICD-10-CM | POA: Diagnosis not present

## 2017-12-30 DIAGNOSIS — N6091 Unspecified benign mammary dysplasia of right breast: Secondary | ICD-10-CM | POA: Insufficient documentation

## 2017-12-30 DIAGNOSIS — N6011 Diffuse cystic mastopathy of right breast: Secondary | ICD-10-CM | POA: Insufficient documentation

## 2017-12-30 DIAGNOSIS — C50911 Malignant neoplasm of unspecified site of right female breast: Secondary | ICD-10-CM | POA: Diagnosis not present

## 2017-12-30 DIAGNOSIS — Z8249 Family history of ischemic heart disease and other diseases of the circulatory system: Secondary | ICD-10-CM | POA: Insufficient documentation

## 2017-12-30 HISTORY — PX: BREAST LUMPECTOMY: SHX2

## 2017-12-30 HISTORY — PX: BREAST LUMPECTOMY WITH RADIOACTIVE SEED LOCALIZATION: SHX6424

## 2017-12-30 IMAGING — MG MM BREAST SURGICAL SPECIMEN
1 series · 1 of 1 positions shown · non-contrast
Comparison: Previous exam(s).

CLINICAL DATA: Status post seed localization of 3 sites in the
RIGHT breast.

EXAM:
SPECIMEN RADIOGRAPH OF THE RIGHT BREAST

[R]
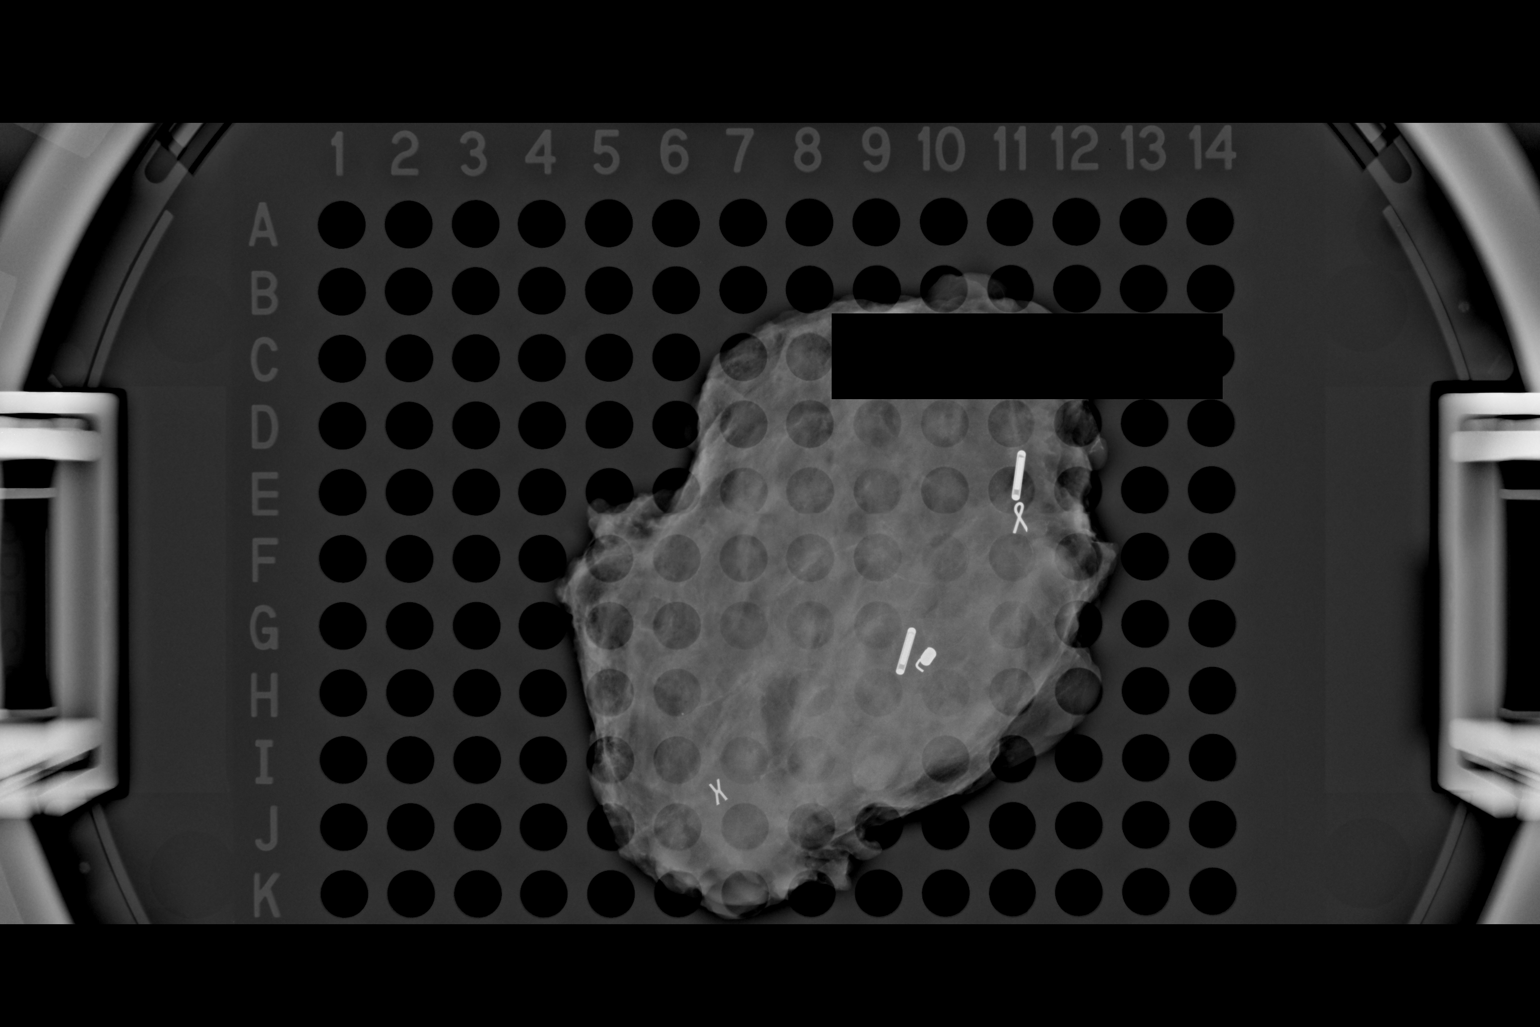

[1 of 1 positions shown; findings below may reference images not displayed]

FINDINGS: Status post excision of the right breast.

A ribbon shaped clip and the are present within the specimen and
marked for pathology.

A coil shaped clip and is associated cp are present marked for
pathology

X shaped clip is identified within the specimen. The accompanying
see was reportedly removed separately and is imaged within expressed
cup.

For
IMPRESSION: Specimen radiograph of the RIGHT breast. Expected seeds and clips
have been removed.

## 2017-12-30 SURGERY — BREAST LUMPECTOMY WITH RADIOACTIVE SEED LOCALIZATION
Anesthesia: General | Site: Breast | Laterality: Right

## 2017-12-30 MED ORDER — CEFAZOLIN SODIUM-DEXTROSE 2-4 GM/100ML-% IV SOLN
INTRAVENOUS | Status: AC
  Filled 2017-12-30: qty 100

## 2017-12-30 MED ORDER — ACETAMINOPHEN 325 MG PO TABS
650.0000 mg | ORAL_TABLET | ORAL | Status: DC | PRN
Start: 2017-12-30 — End: 2017-12-30

## 2017-12-30 MED ORDER — ENSURE PRE-SURGERY PO LIQD
296.0000 mL | Freq: Once | ORAL | Status: DC
  Filled 2017-12-30: qty 296

## 2017-12-30 MED ORDER — GABAPENTIN 100 MG PO CAPS
100.0000 mg | ORAL_CAPSULE | ORAL | Status: AC
  Administered 2017-12-30: 100 mg via ORAL

## 2017-12-30 MED ORDER — GABAPENTIN 100 MG PO CAPS
ORAL_CAPSULE | ORAL | Status: AC
  Administered 2017-12-30: 100 mg via ORAL
  Filled 2017-12-30: qty 1

## 2017-12-30 MED ORDER — HYDROMORPHONE HCL 1 MG/ML IJ SOLN: 0.2500 mg | INTRAMUSCULAR | Status: DC | PRN

## 2017-12-30 MED ORDER — ONDANSETRON HCL 4 MG/2ML IJ SOLN
INTRAMUSCULAR | Status: AC
  Filled 2017-12-30: qty 2

## 2017-12-30 MED ORDER — PROPOFOL 10 MG/ML IV BOLUS
INTRAVENOUS | Status: DC | PRN
  Administered 2017-12-30: 150 mg via INTRAVENOUS

## 2017-12-30 MED ORDER — ALBUTEROL SULFATE (2.5 MG/3ML) 0.083% IN NEBU
INHALATION_SOLUTION | RESPIRATORY_TRACT | Status: AC
  Filled 2017-12-30: qty 3

## 2017-12-30 MED ORDER — ACETAMINOPHEN 650 MG RE SUPP
650.0000 mg | RECTAL | Status: DC | PRN
Start: 2017-12-30 — End: 2017-12-30

## 2017-12-30 MED ORDER — DEXAMETHASONE SODIUM PHOSPHATE 10 MG/ML IJ SOLN
INTRAMUSCULAR | Status: DC | PRN
  Administered 2017-12-30: 5 mg via INTRAVENOUS

## 2017-12-30 MED ORDER — LIDOCAINE 2% (20 MG/ML) 5 ML SYRINGE
INTRAMUSCULAR | Status: DC | PRN
  Administered 2017-12-30: 100 mg via INTRAVENOUS

## 2017-12-30 MED ORDER — ACETAMINOPHEN 325 MG PO TABS
ORAL_TABLET | ORAL | Status: AC
  Filled 2017-12-30: qty 2

## 2017-12-30 MED ORDER — BUPIVACAINE HCL (PF) 0.25 % IJ SOLN
INTRAMUSCULAR | Status: AC
  Filled 2017-12-30: qty 30

## 2017-12-30 MED ORDER — BUPIVACAINE-EPINEPHRINE (PF) 0.25% -1:200000 IJ SOLN
INTRAMUSCULAR | Status: AC
  Filled 2017-12-30: qty 30

## 2017-12-30 MED ORDER — DEXAMETHASONE SODIUM PHOSPHATE 10 MG/ML IJ SOLN
INTRAMUSCULAR | Status: AC
  Filled 2017-12-30: qty 1

## 2017-12-30 MED ORDER — ONDANSETRON HCL 4 MG/2ML IJ SOLN: 4.0000 mg | Freq: Once | INTRAMUSCULAR | Status: DC | PRN

## 2017-12-30 MED ORDER — LACTATED RINGERS IV SOLN
INTRAVENOUS | Status: DC | PRN
  Administered 2017-12-30: 07:00:00 via INTRAVENOUS

## 2017-12-30 MED ORDER — 0.9 % SODIUM CHLORIDE (POUR BTL) OPTIME
TOPICAL | Status: DC | PRN
  Administered 2017-12-30: 1000 mL

## 2017-12-30 MED ORDER — TRAMADOL HCL 50 MG PO TABS: 50.0000 mg | ORAL_TABLET | Freq: Four times a day (QID) | ORAL | 0 refills | Status: DC | PRN

## 2017-12-30 MED ORDER — LIDOCAINE 2% (20 MG/ML) 5 ML SYRINGE
INTRAMUSCULAR | Status: AC
  Filled 2017-12-30: qty 5

## 2017-12-30 MED ORDER — HEMOSTATIC AGENTS (NO CHARGE) OPTIME
TOPICAL | Status: DC | PRN
  Administered 2017-12-30: 1 via TOPICAL

## 2017-12-30 MED ORDER — BUPIVACAINE-EPINEPHRINE 0.25% -1:200000 IJ SOLN
INTRAMUSCULAR | Status: DC | PRN
  Administered 2017-12-30: 10 mL

## 2017-12-30 MED ORDER — FENTANYL CITRATE (PF) 250 MCG/5ML IJ SOLN
INTRAMUSCULAR | Status: AC
  Filled 2017-12-30: qty 5

## 2017-12-30 MED ORDER — ACETAMINOPHEN 500 MG PO TABS: 1000.0000 mg | ORAL_TABLET | ORAL | Status: DC

## 2017-12-30 MED ORDER — SODIUM CHLORIDE 0.9 % IV SOLN
INTRAVENOUS | Status: DC | PRN
  Administered 2017-12-30: 20 ug/min via INTRAVENOUS

## 2017-12-30 MED ORDER — FENTANYL CITRATE (PF) 250 MCG/5ML IJ SOLN
INTRAMUSCULAR | Status: DC | PRN
  Administered 2017-12-30 (×2): 25 ug via INTRAVENOUS

## 2017-12-30 MED ORDER — ALBUTEROL SULFATE (2.5 MG/3ML) 0.083% IN NEBU
2.5000 mg | INHALATION_SOLUTION | Freq: Four times a day (QID) | RESPIRATORY_TRACT | Status: DC | PRN
  Administered 2017-12-30: 2.5 mg via RESPIRATORY_TRACT

## 2017-12-30 MED ORDER — CEFAZOLIN SODIUM-DEXTROSE 2-4 GM/100ML-% IV SOLN
2.0000 g | INTRAVENOUS | Status: AC
  Administered 2017-12-30: 2 g via INTRAVENOUS
  Filled 2017-12-30: qty 100

## 2017-12-30 MED ORDER — PROPOFOL 10 MG/ML IV BOLUS
INTRAVENOUS | Status: AC
  Filled 2017-12-30: qty 40

## 2017-12-30 MED ORDER — ACETAMINOPHEN 500 MG PO TABS
ORAL_TABLET | ORAL | Status: AC
  Filled 2017-12-30: qty 2

## 2017-12-30 MED ORDER — PHENYLEPHRINE 40 MCG/ML (10ML) SYRINGE FOR IV PUSH (FOR BLOOD PRESSURE SUPPORT)
PREFILLED_SYRINGE | INTRAVENOUS | Status: DC | PRN
  Administered 2017-12-30 (×5): 80 ug via INTRAVENOUS

## 2017-12-30 MED ORDER — PHENYLEPHRINE 40 MCG/ML (10ML) SYRINGE FOR IV PUSH (FOR BLOOD PRESSURE SUPPORT)
PREFILLED_SYRINGE | INTRAVENOUS | Status: AC
  Filled 2017-12-30: qty 10

## 2017-12-30 MED ORDER — MEPERIDINE HCL 50 MG/ML IJ SOLN: 6.2500 mg | INTRAMUSCULAR | Status: DC | PRN

## 2017-12-30 MED ORDER — ONDANSETRON HCL 4 MG/2ML IJ SOLN
INTRAMUSCULAR | Status: DC | PRN
  Administered 2017-12-30: 4 mg via INTRAVENOUS

## 2017-12-30 SURGICAL SUPPLY — 49 items
ADH SKN CLS APL DERMABOND .7 (GAUZE/BANDAGES/DRESSINGS) ×1
APPLIER CLIP 9.375 MED OPEN (MISCELLANEOUS)
APR CLP MED 9.3 20 MLT OPN (MISCELLANEOUS)
BINDER BREAST LRG (GAUZE/BANDAGES/DRESSINGS) IMPLANT
BINDER BREAST XLRG (GAUZE/BANDAGES/DRESSINGS) IMPLANT
BLADE SURG 15 STRL LF DISP TIS (BLADE) ×1 IMPLANT
BLADE SURG 15 STRL SS (BLADE) ×3
CANISTER SUCT 3000ML PPV (MISCELLANEOUS) ×3 IMPLANT
CHLORAPREP W/TINT 26ML (MISCELLANEOUS) ×3 IMPLANT
CLIP APPLIE 9.375 MED OPEN (MISCELLANEOUS) IMPLANT
CLOSURE WOUND 1/2 X4 (GAUZE/BANDAGES/DRESSINGS) ×1
COVER PROBE W GEL 5X96 (DRAPES) ×3 IMPLANT
COVER SURGICAL LIGHT HANDLE (MISCELLANEOUS) ×3 IMPLANT
DERMABOND ADVANCED (GAUZE/BANDAGES/DRESSINGS) ×2
DERMABOND ADVANCED .7 DNX12 (GAUZE/BANDAGES/DRESSINGS) ×1 IMPLANT
DEVICE DUBIN SPECIMEN MAMMOGRA (MISCELLANEOUS) ×3 IMPLANT
DRAPE CHEST BREAST 15X10 FENES (DRAPES) ×3 IMPLANT
DRAPE UTILITY XL STRL (DRAPES) ×3 IMPLANT
ELECT COATED BLADE 2.86 ST (ELECTRODE) ×3 IMPLANT
ELECT REM PT RETURN 9FT ADLT (ELECTROSURGICAL) ×3
ELECTRODE REM PT RTRN 9FT ADLT (ELECTROSURGICAL) ×1 IMPLANT
GLOVE BIO SURGEON STRL SZ7 (GLOVE) ×6 IMPLANT
GLOVE BIOGEL PI IND STRL 7.5 (GLOVE) ×1 IMPLANT
GLOVE BIOGEL PI INDICATOR 7.5 (GLOVE) ×2
GOWN STRL REUS W/ TWL LRG LVL3 (GOWN DISPOSABLE) ×2 IMPLANT
GOWN STRL REUS W/TWL LRG LVL3 (GOWN DISPOSABLE) ×6
HEMOSTAT ARISTA ABSORB 3G PWDR (MISCELLANEOUS) ×3 IMPLANT
ILLUMINATOR WAVEGUIDE N/F (MISCELLANEOUS) ×3 IMPLANT
KIT BASIN OR (CUSTOM PROCEDURE TRAY) ×3 IMPLANT
KIT MARKER MARGIN INK (KITS) ×3 IMPLANT
NEEDLE HYPO 25GX1X1/2 BEV (NEEDLE) ×3 IMPLANT
NS IRRIG 1000ML POUR BTL (IV SOLUTION) ×3 IMPLANT
PACK SURGICAL SETUP 50X90 (CUSTOM PROCEDURE TRAY) ×3 IMPLANT
PENCIL BUTTON HOLSTER BLD 10FT (ELECTRODE) ×3 IMPLANT
SPONGE LAP 18X18 X RAY DECT (DISPOSABLE) ×5 IMPLANT
STRIP CLOSURE SKIN 1/2X4 (GAUZE/BANDAGES/DRESSINGS) ×2 IMPLANT
SUT MNCRL AB 4-0 PS2 18 (SUTURE) ×3 IMPLANT
SUT SILK 2 0 SH (SUTURE) IMPLANT
SUT VIC AB 2-0 SH 27 (SUTURE) ×3
SUT VIC AB 2-0 SH 27XBRD (SUTURE) ×1 IMPLANT
SUT VIC AB 3-0 SH 27 (SUTURE) ×3
SUT VIC AB 3-0 SH 27X BRD (SUTURE) ×1 IMPLANT
SYR BULB 3OZ (MISCELLANEOUS) ×3 IMPLANT
SYR CONTROL 10ML LL (SYRINGE) ×3 IMPLANT
TOWEL OR 17X24 6PK STRL BLUE (TOWEL DISPOSABLE) ×3 IMPLANT
TOWEL OR 17X26 10 PK STRL BLUE (TOWEL DISPOSABLE) ×3 IMPLANT
TUBE CONNECTING 12'X1/4 (SUCTIONS) ×1
TUBE CONNECTING 12X1/4 (SUCTIONS) ×2 IMPLANT
YANKAUER SUCT BULB TIP NO VENT (SUCTIONS) ×3 IMPLANT

## 2017-12-30 NOTE — Anesthesia Preprocedure Evaluation (Addendum)
Anesthesia Evaluation  Patient identified by MRN, date of birth, ID band Patient awake    Reviewed: Allergy & Precautions, NPO status , Patient's Chart, lab work & pertinent test results  Airway Mallampati: I  TM Distance: >3 FB Neck ROM: Full    Dental  (+) Teeth Intact, Dental Advisory Given,    Pulmonary asthma , former smoker,    Pulmonary exam normal        Cardiovascular Normal cardiovascular exam+ dysrhythmias Atrial Fibrillation      Neuro/Psych CVA    GI/Hepatic   Endo/Other    Renal/GU      Musculoskeletal   Abdominal   Peds  Hematology   Anesthesia Other Findings   Reproductive/Obstetrics                            Anesthesia Physical Anesthesia Plan  ASA: III  Anesthesia Plan: General   Post-op Pain Management:    Induction: Intravenous  PONV Risk Score and Plan: 3 and Ondansetron, Midazolam and Treatment may vary due to age or medical condition  Airway Management Planned: LMA  Additional Equipment:   Intra-op Plan:   Post-operative Plan:   Informed Consent: I have reviewed the patients History and Physical, chart, labs and discussed the procedure including the risks, benefits and alternatives for the proposed anesthesia with the patient or authorized representative who has indicated his/her understanding and acceptance.     Plan Discussed with: CRNA and Surgeon  Anesthesia Plan Comments:         Anesthesia Quick Evaluation

## 2017-12-30 NOTE — Op Note (Signed)
Preoperative diagnosis: Right breast cancer, right breast lesion with discordant biopsy Postoperative diagnosis: Same as above Procedure: Right breast seed bracketed lumpectomy Surgeon: Dr. Serita Grammes Anesthesia: General Estimated blood loss: Less than 10 cc Drains: None Complications: None Specimens: 1.  Right breast tissue containing all 3 clips and 2 seeds marked with paint 2.  Seeds sent separately Sponge and count was correct at completion Disposition to recovery in stable condition  Indications: This is a 77 year old female who ended up having several masses on mammography.  A biopsy of 1 of the masses is a grade 1 invasive ductal carcinoma that is hormone receptor positive.  Another biopsy is fibrocystic change that is discordant.  Another biopsy is atypical lobular and atypical ductal hyperplasia.  We discussed removing all of 3 of these with seed guidance.  She had her seeds placed prior to beginning I had these mammograms available.  Procedure: After informed consent was obtained the patient was taken the operating room.  I confirmed the seeds were in place.  She was given antibiotics.  SCDs were in place.  She was placed under general anesthesia without complication.  Her breast was prepped and draped in the standard sterile surgical fashion.  A surgical timeout was then performed.  The seeds were in a radial distribution towards the lower outer quadrant.  I infiltrated Marcaine around the areola made a periareolar incision to hide the scar.  I then used cautery to dissect around the seeds using neoprobe to guide this.  I removed the seeds with an attempt to get a remove normal tissue around the tumor.  1 of the seeds was in a hematoma cavity and I sent this separately.  Mammogram confirmed I removed the other 2 seeds in all 3 clips.  Hemostasis was obtained.  I did place some Arista in the cavity she will restart her Xarelto tomorrow.  I closed the cavity after placing clips with  2-0 Vicryl suture.  Skin was closed with 3-0 Vicryl and 5-0 Monocryl.  Steri-Strips and glue were applied.  She tolerated this well and was transferred to recovery stable.

## 2017-12-30 NOTE — Discharge Instructions (Signed)
[place] Office Phone Number [phone]  POST OP INSTRUCTIONS Use tylenol 650 mg every six hours for next 72 hours.  Use ice daily as well.  Restart xarelto tomorrow.  Always review your discharge instruction sheet given to you by the facility where your surgery was performed.  IF YOU HAVE DISABILITY OR FAMILY LEAVE FORMS, YOU MUST BRING THEM TO THE OFFICE FOR PROCESSING.  DO NOT GIVE THEM TO YOUR DOCTOR.  1. A prescription for pain medication may be given to you upon discharge.  Take your pain medication as prescribed, if needed.  If narcotic pain medicine is not needed, then you may take acetaminophen (Tylenol), naprosyn (Alleve) or ibuprofen (Advil) as needed. 2. Take your usually prescribed medications unless otherwise directed 3. If you need a refill on your pain medication, please contact your pharmacy.  They will contact our office to request authorization.  Prescriptions will not be filled after 5pm or on week-ends. 4. You should eat very light the first 24 hours after surgery, such as soup, crackers, pudding, etc.  Resume your normal diet the day after surgery. 5. Most patients will experience some swelling and bruising in the breast.  Ice packs and a good support bra will help.  Wear the breast binder provided or a sports bra for 72 hours day and night.  After that wear a sports bra during the day until you return to the office. Swelling and bruising can take several days to resolve.  6. It is common to experience some constipation if taking pain medication after surgery.  Increasing fluid intake and taking a stool softener will usually help or prevent this problem from occurring.  A mild laxative (Milk of Magnesia or Miralax) should be taken according to package directions if there are no bowel movements after 48 hours. 7. Unless discharge instructions indicate otherwise, you may remove your bandages 48 hours after surgery and you may shower at that time.  You may have  steri-strips (small skin tapes) in place directly over the incision.  These strips should be left on the skin for 7-10 days and will come off on their own.  If your surgeon used skin glue on the incision, you may shower in 24 hours.  The glue will flake off over the next 2-3 weeks.  Any sutures or staples will be removed at the office during your follow-up visit. 8. ACTIVITIES:  You may resume regular daily activities (gradually increasing) beginning the next day.  Wearing a good support bra or sports bra minimizes pain and swelling.  You may have sexual intercourse when it is comfortable. a. You may drive when you no longer are taking prescription pain medication, you can comfortably wear a seatbelt, and you can safely maneuver your car and apply brakes. b. RETURN TO WORK:  ______________________________________________________________________________________ 9. You should see your doctor in the office for a follow-up appointment approximately two weeks after your surgery.  Your doctors nurse will typically make your follow-up appointment when she calls you with your pathology report.  Expect your pathology report 3-4 business days after your surgery.  You may call to check if you do not hear from [place] after three days. 10. OTHER INSTRUCTIONS: _______________________________________________________________________________________________ _____________________________________________________________________________________________________________________________________ _____________________________________________________________________________________________________________________________________ _____________________________________________________________________________________________________________________________________  WHEN TO CALL DR Grisell Bissette: 1. Fever over 101.0 2. Nausea and/or vomiting. 3. Extreme swelling or bruising. 4. Continued bleeding from incision. 5. Increased pain, redness,  or drainage from the incision.  The clinic staff is available to answer your questions during regular business  hours.  Please dont hesitate to call and ask to speak to one of the nurses for clinical concerns.  If you have a medical emergency, go to the nearest emergency room or call 911.  A surgeon from [facility] is always on call at the hospital.  For further questions, please visit [email or web address]

## 2017-12-30 NOTE — H&P (Signed)
Laura Mendez is an 77 y.o. female.   Chief Complaint: breast cancer HPI: 51 yof who presentsed after having 6 month follow up mm for nodules. she has b density breasts. she had no mass or dc. she is on xarelto for afib. she has multiple family members with ovarian cancer. the six month follow up noted a posterior mass that appeared more spiculated and an anterior mass. there is nodularity in between. an [place] was performed and then an attempt to biopsy posterior lesion but actually biopsied anterior. this was 5x4x4 mm mass. 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 then an attempt to biopsy posterior lesion was performed stereo and this is alh and adh. t the two clips that are there are 2.7 cm apart. biopsies done on xarelto and her entire right breast is a hematoma. she has axillary [place] that is negative. 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    Past Medical History:  Diagnosis Date  . Asthma   . Chronic airway obstruction, not elsewhere classified   . Diastolic dysfunction without heart failure   . Disorder of bone and cartilage, unspecified   . Dysrhythmia    A-Fib  . Family history of colon cancer   . Family history of kidney cancer   . Family history of ovarian cancer   . Paroxysmal atrial flutter ([place])   . Stroke [facility])    TIA  . TIA (transient ischemic attack)   . Unspecified asthma(493.90)     Past Surgical History:  Procedure Laterality Date  . ABDOMINAL HYSTERECTOMY    . BUNIONECTOMY Bilateral   . EYE SURGERY Bilateral    cataract removal    Family History  Problem Relation Age of Onset  . COPD Father   . Colon cancer Mother 55       mets to pancreas and liver  . Kidney cancer Sister 5       d. 67  . Lung cancer Sister        d. 58, smoker  . Ovarian cancer Sister 33       d. 34  . Cirrhosis Sister         d. 72  . Melanoma Sister 34  . Ovarian cancer Maternal Grandmother        d. 92  . Heart disease Maternal Grandfather        rheumatic heart disease  . Rectal cancer Paternal Grandfather        d. 61  . Parkinson's disease Brother   . Ovarian cancer Maternal Aunt   . Colon cancer Maternal Uncle        d. 72-73  . Heart attack Paternal Uncle        d. 31  . Kidney cancer Brother 47  . Colon cancer Maternal Uncle        d. 56  . Cancer Other        MGMs pat 1/2 sister with uterine and rectal cancer  . Colon cancer Cousin        d. 28 - mat first cousin  . Breast cancer Neg Hx    Social History:  reports that she quit smoking about 40 years ago. She quit after 0.00 years of use. She has never used smokeless tobacco. She reports that she does not drink  alcohol or use drugs.  Allergies:  Allergies  Allergen Reactions  . Clarithromycin Anaphylaxis    "just about killed me"  . Ibuprofen Hives  . Codeine Nausea Only  . Codeine Phosphate Nausea Only  . Diltiazem Rash  . Tape Itching and Other (See Comments)    Tears skin  . Verapamil Hcl Er Rash    Facility-Administered Medications Prior to Admission  Medication Dose Route Frequency Provider Last Rate Last Dose  . Mepolizumab SOLR 100 mg  100 mg Subcutaneous Q28 days Baird Lyons D, MD   100 mg at 12/20/17 1455   Medications Prior to Admission  Medication Sig Dispense Refill  . ADVAIR DISKUS 250-50 MCG/DOSE AEPB INHALE 1 PUFF TWICE DAILY, RINSE AFTER USE (Patient taking differently: INHALE 1 PUFF ONCE DAILY IN THE MORNING, RINSE AFTER USE) 180 each 1  . albuterol (VENTOLIN HFA) 108 (90 Base) MCG/ACT inhaler INHALE 2 PUFFS EVERY 4 HOURS AS NEEDED (Patient taking differently: INHALE 2 PUFFS EVERY 4 HOURS AS NEEDED WHEEZING/SHORTNESS OF BREATH) 18 Inhaler 3  . cholecalciferol (VITAMIN D) 1000 units tablet Take 1,000 Units by mouth daily.    . clobetasol ointment (TEMOVATE) 1.66 % Apply 1 application topically 2 (two)  times daily as needed (for skin irritation).    . fish oil-omega-3 fatty acids 1000 MG capsule Take 1 g by mouth daily.     Marland Kitchen letrozole (FEMARA) 2.5 MG tablet Take 1 tablet (2.5 mg total) by mouth daily. (Patient taking differently: Take 2.5 mg by mouth at bedtime. ) 90 tablet 3  . MEGARED OMEGA-3 KRILL OIL 500 MG CAPS Take 500 mg by mouth daily.     . Multiple Vitamin (MULTIVITAMIN) tablet Take 1 tablet by mouth daily.     Marland Kitchen NUCALA 100 MG/ML SOAJ Inject 100 mg into the skin every 28 (twenty-eight) days.    Marland Kitchen triamcinolone ointment (KENALOG) 0.1 % Apply 1 application topically 2 (two) times daily as needed (for itching).    Alveda Reasons 20 MG TABS tablet TAKE 1 TABLET BY MOUTH EVERY DAY WITH SUPPER (Patient taking differently: TAKE 1 TABLET BY MOUTH EVERY DAY) 30 tablet 6    No results found for this or any previous visit (from the past 48 hour(s)). Mm Rt Radioactive Seed Loc Mammo Guide  Result Date: 12/29/2017 EXAM: MAMMOGRAPHIC GUIDED RADIOACTIVE SEED LOCALIZATION OF THE RIGHT BREAST: 3 LESIONS LOCALIZED. COMPARISON:  Previous exam(s). FINDINGS: Patient presents for radioactive seed localization of 3 right breast lesions prior to surgical excision. I met with the patient and we discussed the procedure of seed localization including benefits and alternatives. We discussed the high likelihood of a successful procedure. We discussed the risks of the procedure including infection, bleeding, tissue injury and further surgery. We discussed the low dose of radioactivity involved in the procedure. Informed, written consent was given. The usual time-out protocol was performed immediately prior to the procedure. Using mammographic guidance, sterile technique, 1% lidocaine and an I-125 radioactive seed, the ribbon shaped biopsy clip was localized using a medial approach. The follow-up mammogram images confirm the seed in the expected location and were marked for Dr. Donne Hazel. Follow-up survey of the patient  confirms presence of the radioactive seed. Order number of I-125 seed:  [id number]. Total activity:  9.323 millicuries reference Date: 12/20/2017. Using mammographic guidance, sterile technique, 1% lidocaine and an I-125 radioactive seed, the coil shaped biopsy clip was localized using a medial approach. The follow-up mammogram images confirm the seed in the expected location and were marked  for Dr. Donne Hazel. Follow-up survey of the patient confirms presence of the radioactive seed. Order number of I-125 seed:  [id number]. Total activity:  0.037 millicuries reference Date: 12/20/2017. Using mammographic guidance, sterile technique, 1% lidocaine and an I-125 radioactive seed, the X shaped biopsy clip was localized using a medial approach. The follow-up mammogram images confirm the seed in the expected location and were marked for Dr. Donne Hazel. Follow-up survey of the patient confirms presence of the radioactive seed. Order number of I-125 seed:  [id number]. Total activity:  4.503 millicuries reference Date: 12/08/2017. The patient tolerated the procedure well and was released from the [facility]. She was given instructions regarding seed removal. IMPRESSION: Radioactive seed localization of 3 left breast lesions breast, the ribbon shaped biopsy clip and coil clip localizing breast carcinomas and the X shaped clip localizing a discordant area of fibrocystic change. No apparent complications. Electronically Signed   By: Lajean Manes M.D.   On: 12/29/2017 15:01   Mm Rt Radio Seed Ea Add Lesion Loc Mammo  Result Date: 12/29/2017 EXAM: MAMMOGRAPHIC GUIDED RADIOACTIVE SEED LOCALIZATION OF THE RIGHT BREAST: 3 LESIONS LOCALIZED. COMPARISON:  Previous exam(s). FINDINGS: Patient presents for radioactive seed localization of 3 right breast lesions prior to surgical excision. I met with the patient and we discussed the procedure of seed localization including benefits and alternatives. We discussed the high likelihood of a  successful procedure. We discussed the risks of the procedure including infection, bleeding, tissue injury and further surgery. We discussed the low dose of radioactivity involved in the procedure. Informed, written consent was given. The usual time-out protocol was performed immediately prior to the procedure. Using mammographic guidance, sterile technique, 1% lidocaine and an I-125 radioactive seed, the ribbon shaped biopsy clip was localized using a medial approach. The follow-up mammogram images confirm the seed in the expected location and were marked for Dr. Donne Hazel. Follow-up survey of the patient confirms presence of the radioactive seed. Order number of I-125 seed:  [id number]. Total activity:  9.179 millicuries reference Date: 12/20/2017. Using mammographic guidance, sterile technique, 1% lidocaine and an I-125 radioactive seed, the coil shaped biopsy clip was localized using a medial approach. The follow-up mammogram images confirm the seed in the expected location and were marked for Dr. Donne Hazel. Follow-up survey of the patient confirms presence of the radioactive seed. Order number of I-125 seed:  [id number]. Total activity:  8.016 millicuries reference Date: 12/20/2017. Using mammographic guidance, sterile technique, 1% lidocaine and an I-125 radioactive seed, the X shaped biopsy clip was localized using a medial approach. The follow-up mammogram images confirm the seed in the expected location and were marked for Dr. Donne Hazel. Follow-up survey of the patient confirms presence of the radioactive seed. Order number of I-125 seed:  [id number]. Total activity:  7.867 millicuries reference Date: 12/08/2017. The patient tolerated the procedure well and was released from the [facility]. She was given instructions regarding seed removal. IMPRESSION: Radioactive seed localization of 3 left breast lesions breast, the ribbon shaped biopsy clip and coil clip localizing breast carcinomas and the X shaped clip  localizing a discordant area of fibrocystic change. No apparent complications. Electronically Signed   By: Lajean Manes M.D.   On: 12/29/2017 15:01   Mm Rt Radio Seed Ea Add Lesion Loc Mammo  Result Date: 12/29/2017 EXAM: MAMMOGRAPHIC GUIDED RADIOACTIVE SEED LOCALIZATION OF THE RIGHT BREAST: 3 LESIONS LOCALIZED. COMPARISON:  Previous exam(s). FINDINGS: Patient presents for radioactive seed localization of 3 right breast lesions prior to surgical excision. I  met with the patient and we discussed the procedure of seed localization including benefits and alternatives. We discussed the high likelihood of a successful procedure. We discussed the risks of the procedure including infection, bleeding, tissue injury and further surgery. We discussed the low dose of radioactivity involved in the procedure. Informed, written consent was given. The usual time-out protocol was performed immediately prior to the procedure. Using mammographic guidance, sterile technique, 1% lidocaine and an I-125 radioactive seed, the ribbon shaped biopsy clip was localized using a medial approach. The follow-up mammogram images confirm the seed in the expected location and were marked for Dr. Donne Hazel. Follow-up survey of the patient confirms presence of the radioactive seed. Order number of I-125 seed:  [id number]. Total activity:  0.768 millicuries reference Date: 12/20/2017. Using mammographic guidance, sterile technique, 1% lidocaine and an I-125 radioactive seed, the coil shaped biopsy clip was localized using a medial approach. The follow-up mammogram images confirm the seed in the expected location and were marked for Dr. Donne Hazel. Follow-up survey of the patient confirms presence of the radioactive seed. Order number of I-125 seed:  [id number]. Total activity:  9.458 millicuries reference Date: 12/20/2017. Using mammographic guidance, sterile technique, 1% lidocaine and an I-125 radioactive seed, the X shaped biopsy clip was localized  using a medial approach. The follow-up mammogram images confirm the seed in the expected location and were marked for Dr. Donne Hazel. Follow-up survey of the patient confirms presence of the radioactive seed. Order number of I-125 seed:  [id number]. Total activity:  8.638 millicuries reference Date: 12/08/2017. The patient tolerated the procedure well and was released from the [facility]. She was given instructions regarding seed removal. IMPRESSION: Radioactive seed localization of 3 left breast lesions breast, the ribbon shaped biopsy clip and coil clip localizing breast carcinomas and the X shaped clip localizing a discordant area of fibrocystic change. No apparent complications. Electronically Signed   By: Lajean Manes M.D.   On: 12/29/2017 15:01    ROS General Not Present- Appetite Loss, Chills, Fatigue, Fever, Night Sweats, Weight Gain and Weight Loss. HEENT Present- Seasonal Allergies and Wears glasses/contact lenses. Not Present- Earache, Hearing Loss, Hoarseness, Nose Bleed, Oral Ulcers, Ringing in the Ears, Sinus Pain, Sore Throat, Visual Disturbances and Yellow Eyes. Respiratory Not Present- Bloody sputum, Chronic Cough, Difficulty Breathing, Snoring and Wheezing. Breast Present- Breast Mass and Breast Pain. Not Present- Nipple Discharge and Skin Changes. Cardiovascular Not Present- Chest Pain, Difficulty Breathing Lying Down, Leg Cramps, Palpitations, Rapid Heart Rate, Shortness of Breath and Swelling of Extremities. Gastrointestinal Not Present- Abdominal Pain, Bloating, Bloody Stool, Change in Bowel Habits, Chronic diarrhea, Constipation, Difficulty Swallowing, Excessive gas, Gets full quickly at meals, Hemorrhoids, Indigestion, Nausea, Rectal Pain and Vomiting. Female Genitourinary Not Present- Frequency, Nocturia, Painful Urination, Pelvic Pain and Urgency. Musculoskeletal Not Present- Back Pain, Joint Pain, Joint Stiffness, Muscle Pain, Muscle Weakness and Swelling of  Extremities. Neurological Not Present- Decreased Memory, Fainting, Headaches, Numbness, Seizures, Tingling, Tremor, Trouble walking and Weakness. Psychiatric Not Present- Anxiety, Bipolar, Change in Sleep Pattern, Depression, Fearful and Frequent crying. Endocrine Not Present- Cold Intolerance, Excessive Hunger, Hair Changes, Heat Intolerance, Hot flashes and New Diabetes. Hematology Present- Blood Thinners and Easy Bruising. Not Present- Excessive bleeding, Gland problems, HIV and Persistent Infections. Blood pressure (!) 126/93, pulse 60, temperature 98.1 F (36.7 C), temperature source Oral, resp. rate 18, weight 93.4 kg, SpO2 96 %. Physical Exam  General Mental Status-Alert.  Head and Neck Trachea-midline. Thyroid Gland Characteristics - normal size and consistency.  Eye Sclera/Conjunctiva - Bilateral-No scleral icterus.  Chest and Lung Exam Chest and lung exam reveals -quiet, even and easy respiratory effort with no use of accessory muscles.  Breast Nipples-No Discharge. Note: unable to examine right breast due to large hematoma   Cardiovascular Cardiovascular examination reveals -normal heart sounds, regular rate and rhythm with no murmurs.  Abdomen Note: soft nt   Neurologic Neurologic evaluation reveals -alert and oriented x 3 with no impairment of recent or remote memory.  Lymphatic Head & Neck  General Head & Neck Lymphatics: Bilateral - Description - Normal. Axillary  General Axillary Region: Bilateral - Description - Normal. Note: no Misenheimer adenopathy   Assessment/Plan Right 3 seed lumpectomy I think that she does not need sn due to age grade I 5 mm er pos cancer according to guidelines. we discussed surgery and I think she is candidate for lumpectomy. the three areas need to be removed but they are all close. we discussed mastectomy but I dont think there is advantage to this. we will proceed with multlple seeds and lumpectomy. discussed 5%  positive margin rate and possible additional surgery. she will remain overnight and will stop xarelto 48 hours before the surgery and restart following day.  Rolm Bookbinder, MD 12/30/2017, 7:11 AM

## 2017-12-30 NOTE — Interval H&P Note (Signed)
History and Physical Interval Note:  12/30/2017 7:14 AM  Laura Mendez  has presented today for surgery, with the diagnosis of RIGHT BREAST CANCER  The various methods of treatment have been discussed with the patient and family. After consideration of risks, benefits and other options for treatment, the patient has consented to  Procedure(s): BREAST LUMPECTOMY WITH RADIOACTIVE SEED LOCALIZATION X'S 3 (Right) as a surgical intervention .  The patient's history has been reviewed, patient examined, no change in status, stable for surgery.  I have reviewed the patient's chart and labs.  Questions were answered to the patient's satisfaction.     Rolm Bookbinder

## 2017-12-30 NOTE — Transfer of Care (Signed)
Immediate Anesthesia Transfer of Care Note  Patient: Laura Mendez  Procedure(s) Performed: BREAST LUMPECTOMY WITH RADIOACTIVE SEED LOCALIZATION X'S 3 (Right Breast)  Patient Location: PACU  Anesthesia Type:General  Level of Consciousness: awake and patient cooperative  Airway & Oxygen Therapy: Patient Spontanous Breathing  Post-op Assessment: Report given to RN and Post -op Vital signs reviewed and stable  Post vital signs: Reviewed and stable  Last Vitals:  Vitals Value Taken Time  BP 116/68 12/30/2017  8:30 AM  Temp    Pulse 53 12/30/2017  8:32 AM  Resp 19 12/30/2017  8:32 AM  SpO2 93 % 12/30/2017  8:32 AM  Vitals shown include unvalidated device data.  Last Pain:  Vitals:   12/30/17 0556  TempSrc: Oral         Complications: No apparent anesthesia complications

## 2017-12-30 NOTE — Anesthesia Procedure Notes (Signed)
Procedure Name: LMA Insertion Date/Time: 12/30/2017 7:43 AM Performed by: Harden Mo, CRNA Pre-anesthesia Checklist: Patient identified, Emergency Drugs available, Suction available and Patient being monitored Patient Re-evaluated:Patient Re-evaluated prior to induction Oxygen Delivery Method: Circle System Utilized Preoxygenation: Pre-oxygenation with 100% oxygen Induction Type: IV induction LMA: LMA inserted LMA Size: 4.0 Number of attempts: 1 Airway Equipment and Method: Bite block Placement Confirmation: positive ETCO2 Tube secured with: Tape Dental Injury: Teeth and Oropharynx as per pre-operative assessment

## 2017-12-30 NOTE — Anesthesia Postprocedure Evaluation (Signed)
Anesthesia Post Note  Patient: Laura Mendez  Procedure(s) Performed: BREAST LUMPECTOMY WITH RADIOACTIVE SEED LOCALIZATION X'S 3 (Right Breast)     Patient location during evaluation: PACU Anesthesia Type: General Level of consciousness: awake and alert Pain management: pain level controlled Vital Signs Assessment: post-procedure vital signs reviewed and stable Respiratory status: spontaneous breathing, nonlabored ventilation, respiratory function stable and patient connected to nasal cannula oxygen Cardiovascular status: blood pressure returned to baseline and stable Postop Assessment: no apparent nausea or vomiting Anesthetic complications: no    Last Vitals:  Vitals:   12/30/17 1030 12/30/17 1112  BP: 114/76 117/65  Pulse: (!) 59 60  Resp: 15 16  Temp:    SpO2: 95% 95%    Last Pain:  Vitals:   12/30/17 1112  TempSrc:   PainSc: 0-No pain                 Oshua Mcconaha DAVID

## 2017-12-31 ENCOUNTER — Encounter (HOSPITAL_COMMUNITY): Payer: Self-pay | Admitting: General Surgery

## 2018-01-02 DIAGNOSIS — C50411 Malignant neoplasm of upper-outer quadrant of right female breast: Secondary | ICD-10-CM | POA: Diagnosis not present

## 2018-01-04 ENCOUNTER — Telehealth: Payer: Self-pay | Admitting: Radiation Oncology

## 2018-01-04 NOTE — Telephone Encounter (Signed)
Patient states she saw Dr. Donne Hazel and because it was only 6 mm she will not need radiation treatment.

## 2018-01-05 ENCOUNTER — Telehealth: Payer: Self-pay | Admitting: Genetic Counselor

## 2018-01-05 ENCOUNTER — Other Ambulatory Visit: Payer: Self-pay | Admitting: Internal Medicine

## 2018-01-05 ENCOUNTER — Encounter: Payer: Self-pay | Admitting: Genetic Counselor

## 2018-01-05 DIAGNOSIS — Z1379 Encounter for other screening for genetic and chromosomal anomalies: Secondary | ICD-10-CM | POA: Insufficient documentation

## 2018-01-05 NOTE — Telephone Encounter (Signed)
Revealed negative genetic testing.  Discussed that we do not know why she has breast cancer or why there is cancer in the family. It could be due to a different gene that we are not testing, or maybe our current technology may not be able to pick something up.  It will be important for her to keep in contact with genetics to keep up with whether additional testing may be needed. 

## 2018-01-09 ENCOUNTER — Inpatient Hospital Stay: Payer: Medicare Other | Admitting: Hematology and Oncology

## 2018-01-09 ENCOUNTER — Telehealth: Payer: Self-pay | Admitting: Internal Medicine

## 2018-01-09 NOTE — Assessment & Plan Note (Deleted)
12/30/2017:Right lumpectomy: IDC grade 1, 0.6 cm, ADH, ER 100%, PR 100%, HER-2 negative ratio 1.77, Ki-67 1%, margins negative.  Lymph nodes not evaluated.  T1b N0 stage Ia  Pathology counseling: I discussed the final pathology report of the patient provided  a copy of this report. We also discussed the final staging along with previously performed ER/PR and HER-2/neu testing.  Recommendation: Adjuvant antiestrogen therapy with letrozole 2.5 mg p.o. Daily  Letrozole counseling:We discussed the risks and benefits of anti-estrogen therapy with aromatase inhibitors. These include but not limited to insomnia, hot flashes, mood changes, vaginal dryness, bone density loss, and weight gain. We strongly believe that the benefits far outweigh the risks. Patient understands these risks and consented to starting treatment. Planned treatment duration is 5 years.  Return to clinic in 3 months for survivorship care plan visit

## 2018-01-10 NOTE — Telephone Encounter (Signed)
I called [employer] to see if we would need to start all over again with pt's xolair or if we could pick up where we left off. Start over. I called the pt to make her aware. She's coming in tomorrow to fill out and sign paperwork. Nothing further needed.

## 2018-01-11 ENCOUNTER — Telehealth: Payer: Self-pay | Admitting: Internal Medicine

## 2018-01-11 NOTE — Telephone Encounter (Signed)
Laura Mendez came in and signed the [place] consent form today. I will fillout everything and fax it in as soon as I can.

## 2018-01-13 NOTE — Telephone Encounter (Signed)
Called patient an verified new appointment. She agreed on the day and time. Per 8/22 phone msg return.

## 2018-01-16 ENCOUNTER — Ambulatory Visit: Payer: Medicare Other

## 2018-01-16 ENCOUNTER — Ambulatory Visit
Admission: RE | Admit: 2018-01-16 | Discharge: 2018-01-16 | Disposition: A | Discharge status: Home / Self Care | Payer: Medicare Other | Source: Ambulatory Visit | Attending: Radiation Oncology | Admitting: Radiation Oncology

## 2018-01-17 ENCOUNTER — Ambulatory Visit: Payer: Medicare Other

## 2018-01-18 ENCOUNTER — Telehealth: Payer: Self-pay | Admitting: Hematology and Oncology

## 2018-01-18 ENCOUNTER — Inpatient Hospital Stay: Payer: Medicare Other | Admitting: Hematology and Oncology

## 2018-01-18 DIAGNOSIS — C50511 Malignant neoplasm of lower-outer quadrant of right female breast: Secondary | ICD-10-CM

## 2018-01-18 DIAGNOSIS — Z170 Estrogen receptor positive status [ER+]: Secondary | ICD-10-CM

## 2018-01-18 DIAGNOSIS — Z7901 Long term (current) use of anticoagulants: Secondary | ICD-10-CM | POA: Diagnosis not present

## 2018-01-18 DIAGNOSIS — Z79899 Other long term (current) drug therapy: Secondary | ICD-10-CM

## 2018-01-18 DIAGNOSIS — Z79811 Long term (current) use of aromatase inhibitors: Secondary | ICD-10-CM | POA: Diagnosis not present

## 2018-01-18 NOTE — Progress Notes (Signed)
Patient Care Team: Hoyt Koch, MD as PCP - General (Internal Medicine) Rolm Bookbinder, MD as Consulting Physician (General Surgery) Nicholas Lose, MD as Consulting Physician (Hematology and Oncology) Gery Pray, MD as Consulting Physician (Radiation Oncology)  DIAGNOSIS:  Encounter Diagnosis  Name Primary?  . Malignant neoplasm of lower-outer quadrant of right breast of female, estrogen receptor positive ([place])     SUMMARY OF ONCOLOGIC HISTORY:   Malignant neoplasm of lower-outer quadrant of right breast of female, estrogen receptor positive ([place])   11/10/2017 Initial Diagnosis    Six-month follow-up of right breast masses: Anterior mass unchanged and posterior mass showed spiculation.  Biopsy attempt was made on posterior mass but accidentally the anterior mass was actually biopsied.  It revealed grade 1 invasive ductal carcinoma ER 100%, PR 100%, Ki-67 1%, HER-2 negative ratio 1.77; second attempt to biopsy the posterior mass resulted in the biopsy of an intermediate zone between the 2 masses that revealed ADH and ALH, (posterior mass still needs to be biopsied) T1 a N0 stage I a clinical stage    12/30/2017 Surgery    Right lumpectomy: IDC grade 1, 0.6 cm, ADH, ER 100%, PR 100%, HER-2 negative ratio 1.77, Ki-67 1%, margins negative.  Lymph nodes not evaluated.  T1b N0 stage Ia    12/30/2017 Surgery    Right lumpectomy: IDC with calcifications grade 1, 0.6 cm, ADH, margins negative, ER 100%, PR 90%, HER-2 negative ratio 1.77, Ki-67 1%, T1 be N0 stage Ia     01/05/2018 Genetic Testing    Negative genetic testing on the multicancer panel.  The Multi-Gene Panel offered by [facility] includes sequencing and/or deletion duplication testing of the following 84 genes: AIP, ALK, APC, ATM, AXIN2,BAP1,  BARD1, BLM, BMPR1A, BRCA1, BRCA2, BRIP1, CASR, CDC73, CDH1, CDK4, CDKN1B, CDKN1C, CDKN2A (p14ARF), CDKN2A (p16INK4a), CEBPA, CHEK2, CTNNA1, DICER1, DIS3L2, EGFR (c.2369C>T, p.Thr790Met  variant only), EPCAM (Deletion/duplication testing only), FH, FLCN, GATA2, GPC3, GREM1 (Promoter region deletion/duplication testing only), HOXB13 (c.251G>A, p.Gly84Glu), HRAS, KIT, MAX, MEN1, MET, MITF (c.952G>A, p.Glu318Lys variant only), MLH1, MSH2, MSH3, MSH6, MUTYH, NBN, NF1, NF2, NTHL1, PALB2, PDGFRA, PHOX2B, PMS2, POLD1, POLE, POT1, PRKAR1A, PTCH1, PTEN, RAD50, RAD51C, RAD51D, RB1, RECQL4, RET, RUNX1, SDHAF2, SDHA (sequence changes only), SDHB, SDHC, SDHD, SMAD4, SMARCA4, SMARCB1, SMARCE1, STK11, SUFU, TERC, TERT, TMEM127, TP53, TSC1, TSC2, VHL, WRN and WT1.  The report date is January 05, 2018.    01/11/2018 Cancer Staging    Staging form: Breast, AJCC 8th Edition - Pathologic: Stage IA (pT1b, pN0, cM0, G1, ER+, PR+, HER2-) - Signed by Gardenia Phlegm, NP on 01/11/2018     CHIEF COMPLIANT: Follow-up after recent surgery  INTERVAL HISTORY: Laura Mendez is a 77 year old with above-mentioned history of right breast cancer underwent lumpectomy and is here today to discuss pathology report.  She is recovered very well from the recent surgery.  Denies any pain or discomfort in the breast.  REVIEW OF SYSTEMS:   Constitutional: Denies fevers, chills or abnormal weight loss Eyes: Denies blurriness of vision Ears, nose, mouth, throat, and face: Denies mucositis or sore throat Respiratory: Denies cough, dyspnea or wheezes Cardiovascular: Denies palpitation, chest discomfort Gastrointestinal:  Denies nausea, heartburn or change in bowel habits Skin: Denies abnormal skin rashes Lymphatics: Denies new lymphadenopathy or easy bruising Neurological:Denies numbness, tingling or new weaknesses Behavioral/Psych: Mood is stable, no new changes  Extremities: No lower extremity edema Breast:  denies any pain or lumps or nodules in either breasts All other systems were reviewed with the patient  and are negative.  I have reviewed the past medical history, past surgical history, social history and  family history with the patient and they are unchanged from previous note.  ALLERGIES:  is allergic to clarithromycin; ibuprofen; codeine; codeine phosphate; diltiazem; tape; and verapamil hcl er.  MEDICATIONS:  Current Outpatient Medications  Medication Sig Dispense Refill  . ADVAIR DISKUS 250-50 MCG/DOSE AEPB INHALE 1 PUFF TWICE DAILY, RINSE AFTER USE (Patient taking differently: INHALE 1 PUFF ONCE DAILY IN THE MORNING, RINSE AFTER USE) 180 each 1  . ADVAIR DISKUS 250-50 MCG/DOSE AEPB INHALE 1 PUFF TWICE DAILY, RINSE AFTER USE 120 each 5  . albuterol (VENTOLIN HFA) 108 (90 Base) MCG/ACT inhaler INHALE 2 PUFFS EVERY 4 HOURS AS NEEDED (Patient taking differently: INHALE 2 PUFFS EVERY 4 HOURS AS NEEDED WHEEZING/SHORTNESS OF BREATH) 18 Inhaler 3  . cholecalciferol (VITAMIN D) 1000 units tablet Take 1,000 Units by mouth daily.    . clobetasol ointment (TEMOVATE) 2.29 % Apply 1 application topically 2 (two) times daily as needed (for skin irritation).    . fish oil-omega-3 fatty acids 1000 MG capsule Take 1 g by mouth daily.     Marland Kitchen letrozole (FEMARA) 2.5 MG tablet Take 1 tablet (2.5 mg total) by mouth daily. (Patient taking differently: Take 2.5 mg by mouth at bedtime. ) 90 tablet 3  . MEGARED OMEGA-3 KRILL OIL 500 MG CAPS Take 500 mg by mouth daily.     . Multiple Vitamin (MULTIVITAMIN) tablet Take 1 tablet by mouth daily.     Marland Kitchen NUCALA 100 MG/ML SOAJ Inject 100 mg into the skin every 28 (twenty-eight) days.    . traMADol (ULTRAM) 50 MG tablet Take 1 tablet (50 mg total) by mouth every 6 (six) hours as needed. 8 tablet 0  . triamcinolone ointment (KENALOG) 0.1 % Apply 1 application topically 2 (two) times daily as needed (for itching).    Alveda Reasons 20 MG TABS tablet TAKE 1 TABLET BY MOUTH EVERY DAY WITH SUPPER (Patient taking differently: TAKE 1 TABLET BY MOUTH EVERY DAY) 30 tablet 6   Current Facility-Administered Medications  Medication Dose Route Frequency Provider Last Rate Last Dose  .  Mepolizumab SOLR 100 mg  100 mg Subcutaneous Q28 days Baird Lyons D, MD   100 mg at 12/20/17 1455    PHYSICAL EXAMINATION: ECOG PERFORMANCE STATUS: 1 - Symptomatic but completely ambulatory  Vitals:   01/18/18 1524  BP: 140/86  Pulse: 77  Resp: 17  Temp: 98.3 F (36.8 C)  SpO2: 95%   Filed Weights   01/18/18 1524  Weight: 211 lb 11.2 oz (96 kg)    GENERAL:alert, no distress and comfortable SKIN: skin color, texture, turgor are normal, no rashes or significant lesions EYES: normal, Conjunctiva are pink and non-injected, sclera clear OROPHARYNX:no exudate, no erythema and lips, buccal mucosa, and tongue normal  NECK: supple, thyroid normal size, non-tender, without nodularity LYMPH:  no palpable lymphadenopathy in the cervical, axillary or inguinal LUNGS: clear to auscultation and percussion with normal breathing effort HEART: regular rate & rhythm and no murmurs and no lower extremity edema ABDOMEN:abdomen soft, non-tender and normal bowel sounds MUSCULOSKELETAL:no cyanosis of digits and no clubbing  NEURO: alert & oriented x 3 with fluent speech, no focal motor/sensory deficits EXTREMITIES: No lower extremity edema BREAST: Recent right lumpectomy. (exam performed in the presence of a chaperone)  LABORATORY DATA:  I have reviewed the data as listed CMP Latest Ref Rng & Units 12/22/2017 11/16/2017 02/16/2017  Glucose 70 - 99  mg/dL 89 90 103(H)  BUN 8 - 23 mg/dL _0 Creatinine 0.44 - 1.00 mg/dL 0.81 0.96 1.02  Sodium 135 - 145 mmol/L 141 141 143  Potassium 3.5 - 5.1 mmol/L 3.9 4.0 4.3  Chloride 98 - 111 mmol/L 105 104 104  CO2 22 - 32 mmol/L _1 Calcium 8.9 - 10.3 mg/dL 10.0 10.2 10.2  Total Protein 6.5 - 8.1 g/dL - 7.7 7.5  Total Bilirubin 0.3 - 1.2 mg/dL - 0.5 0.6  Alkaline Phos 38 - 126 U/L - 78 57  AST 15 - 41 U/L - 27 27  ALT 0 - 44 U/L - 23 24    Lab Results  Component Value Date   WBC 7.8 12/22/2017   HGB 14.4 12/22/2017   HCT 46.5 (H) 12/22/2017    MCV 95.5 12/22/2017   PLT 260 12/22/2017   NEUTROABS 5.5 11/16/2017    ASSESSMENT & PLAN:  Malignant neoplasm of lower-outer quadrant of right breast of female, estrogen receptor positive ([place]) 12/30/2017:Right lumpectomy: IDC with calcifications grade 1, 0.6 cm, ADH, margins negative, ER 100%, PR 90%, HER-2 negative ratio 1.77, Ki-67 1%, T1 be N0 stage Ia  Pathology counseling: I discussed the final pathology report of the patient provided  a copy of this report. I discussed the margins   We also discussed the final staging along with previously performed ER/PR and HER-2/neu testing.  Recommendations: Resume antiestrogen therapy with letrozole 2.5 mg daily Because of her favorable risk features she did not need adjuvant radiation.  Letrozole toxicities: Denies any adverse effects.  Return to clinic in 6 months for follow-up.  No orders of the defined types were placed in this encounter.  The patient has a good understanding of the overall plan. she agrees with it. she will call with any problems that may develop before the next visit here.   Harriette Ohara, MD 01/18/18

## 2018-01-18 NOTE — Telephone Encounter (Signed)
Gave avs and calendar ° °

## 2018-01-18 NOTE — Assessment & Plan Note (Signed)
12/30/2017:Right lumpectomy: IDC with calcifications grade 1, 0.6 cm, ADH, margins negative, ER 100%, PR 90%, HER-2 negative ratio 1.77, Ki-67 1%, T1 be N0 stage Ia  Pathology counseling: I discussed the final pathology report of the patient provided  a copy of this report. I discussed the margins   We also discussed the final staging along with previously performed ER/PR and HER-2/neu testing.  Recommendations: Resume antiestrogen therapy with letrozole 2.5 mg daily Because of her favorable risk features she did not need adjuvant radiation.  Letrozole toxicities: Denies any adverse effects.  Return to clinic in 6 months for follow-up.

## 2018-01-20 ENCOUNTER — Encounter: Payer: Self-pay | Admitting: *Deleted

## 2018-01-25 ENCOUNTER — Telehealth: Payer: Self-pay | Admitting: Internal Medicine

## 2018-01-25 NOTE — Telephone Encounter (Signed)
I returned [name] Call. The rep (Angie or Levada Dy) I told her [name] was approved [name] had called earlier a/b pt's P/A. "Her p/a was approved." She asked me for the Ref.# and dates. She put that info in her computer and said she would triage the info to [facility] and they should be contacting the pt.Laura Mendez

## 2018-01-25 NOTE — Telephone Encounter (Signed)
Routing to Tammy Scott. 

## 2018-01-30 ENCOUNTER — Ambulatory Visit: Payer: Medicare Other | Admitting: Physical Therapy

## 2018-02-01 NOTE — Telephone Encounter (Signed)
Spoke with patient-she is aware that PA has been approved for Xolair under Part D-will need to speak with [place] regarding her co-pay. Pt notes she only has about 120 dollars left under [employer]. We will seek extra funding or assistance from [facility] [place] to help with cost if to expensive. Pt to call me back and discuss options.

## 2018-02-06 NOTE — Telephone Encounter (Signed)
Pt is aware to call [employer] to see if they have any grant money left this yr..  If Laura Mendez gave her others options, I am not aware.

## 2018-02-08 NOTE — Telephone Encounter (Signed)
I called [employer] to set up del.. They need to get pt's consent and pt is going to call for the co-pay card, they'll give her a # and she'll pay for it with the # they give her. [employer] will call us to set up del. Once all of this is done.

## 2018-02-14 ENCOUNTER — Telehealth: Payer: Self-pay | Admitting: Internal Medicine

## 2018-02-14 NOTE — Telephone Encounter (Signed)
Called patient unable to reach left message to give [place] a call back. Will route to TS

## 2018-02-14 NOTE — Telephone Encounter (Signed)
I called [employer] to give a verbal rx. I had to call back to see if their computers are thru updating and to ask  If they have a free rx program. Carley Hammed does have a free drug program. The process takes 7 business days. I couldn't expedite the process. Remo Lipps told pt she would call her back then.

## 2018-02-15 NOTE — Telephone Encounter (Signed)
Routing to [employer] for f/u.

## 2018-02-15 NOTE — Telephone Encounter (Signed)
[facility]  Had called pt about ordering Xolair. This confused her b/c she had just spoke to [facility] 02/14/18, a/b Getting free med.. She told the pt that would take 7 business days. I called [employer] back and ask them to put a note not to call the pt about ordering till the time has past.

## 2018-02-15 NOTE — Telephone Encounter (Signed)
Pt is calling back [phone] 

## 2018-02-16 NOTE — Telephone Encounter (Signed)
Tammy can you please provide an update on this? Thanks. 

## 2018-02-16 NOTE — Telephone Encounter (Signed)
Another ph note was created 02/14/18, in midstream. Please refer to it for updates. Closing encounter.

## 2018-02-16 NOTE — Telephone Encounter (Signed)
Pt is currently going thru the process of getting free Xolair from [facility]. Process takes 7 days.(02/17/18) Remo Lipps from [employer] told pt she would call her and let her know what was decided. There is no way to expedite the process, I tried.

## 2018-02-20 ENCOUNTER — Encounter
Admit: 2018-02-20 | Discharge: 2018-02-21 | Payer: MEDICARE | Attending: MOHS-Micrographic Surgery | Primary: MOHS-Micrographic Surgery

## 2018-02-20 DIAGNOSIS — L209 Atopic dermatitis, unspecified: Principal | ICD-10-CM

## 2018-02-20 MED ORDER — PREDNISONE 5 MG TABLET
ORAL_TABLET | Freq: Every day | ORAL | 0 refills | 0 days | Status: CP
Start: 2018-02-20 — End: 2018-05-26

## 2018-02-20 MED ORDER — DUPILUMAB 300 MG/2 ML SUBCUTANEOUS SYRINGE
0 refills | 0 days | Status: CP
Start: 2018-02-20 — End: 2018-03-24

## 2018-02-20 NOTE — Unmapped (Signed)
Per test claim for DUPIXENT 300 MG/2 ML SYRINGE at the [facility], patient needs Medication Assistance Program for Prior Authorization.

## 2018-02-20 NOTE — Unmapped (Signed)
Dermatology Follow-up Note    A/P:    Eczematous dermatitis, flaring:  ?? She has failed methotrexate due to side effects, and triamcinolone and clobetasol ointment due to lack of efficacy  ?? Start prednisone 20 mg daily; will taper over 1-2 weeks after she starts dupilumab  ?? Start dupilumab per below  ?? Day 0: inject dupilumab subcutaneously 600 mg on day 0. Day 15: inject dupilumab 300 mg every 14 days.  ?? Dupilumab risks discussion: After discussing risks including conjunctivitis, herpes infection, rash, and injection site reactions, she was agreeable to start dupilumab. She knows to call if he experiences any of these side effects.      Return in about 3 weeks (around 03/13/2018) for injection training.      CC:  Rash    HPI:  Courtney Walker is a friendly 77 y.o. female last seen by Dr. Loa Socks 3 months ago for eczematous dermatitis here for follow-up. Since last visit, her rash has been flaring severely with terrible itching on her arms, back, chest, and legs despite using triamcinolone ointment and clobetasol ointment under occlusion.     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   Neuro: Alert and oriented, and answers questions appropriately.  Skin: Per patient request, focused exam with inspection and palpation of the face, neck, arms, back, legs was performed today. Findings were normal with exception of the following:  - Eczematous plaques on back, arms, and legs  - All other areas examined were normal or had no significant findings      The patient was discussed with Dr. Orma Flaming who agrees with the assessment and plan as above.

## 2018-02-20 NOTE — Unmapped (Signed)
Patient Education        dupilumab  Pronunciation:  doo PIL [place] mab  Brand:  Dupixent  What is the most important information I should know about dupilumab?  Follow all directions on your medicine label and package. Tell each of your healthcare providers about all your medical conditions, allergies, and all medicines you use.  What is dupilumab?  Dupilumab is used to treat moderate-to-severe eczema that cannot be controlled with topical medicines (for use on the skin).  Dupilumab is also used together with other medications to prevent severe asthma attacks.  Dupilumab is for use in adults and children at least 50 years old.  Dupilumab may also be used for purposes not listed in this medication guide.  What should I discuss with my healthcare provider before using dupilumab?  You should not use dupilumab if you are allergic to it.  Dupilumab should not be given to a child younger than 67 years old.  Tell your doctor if you have ever had:  ?? eye problems;  ?? a parasite infection (such as roundworms or tapeworms); or  ?? if you are scheduled to receive any vaccine.  Tell your doctor if you are pregnant or breastfeeding.  How should I use dupilumab?  Follow all directions on your prescription label and read all medication guides or instruction sheets. Use the medicine exactly as directed.  Dupilumab is injected under the skin, usually once every other week. Your first dose may be given in 2 injections.  A healthcare provider may teach you how to properly use the medication by yourself. Read and carefully follow any Instructions for Use provided with your medicine. Ask your doctor or pharmacist if you don't understand all instructions.  Do not shake the prefilled syringe. Prepare your injection only when you are ready to give it. Do not use if the medicine looks cloudy, has changed colors, or has particles in it. Call your pharmacist for new medicine.  Store prefilled syringes in their original carton in the refrigerator. Protect from light and do not freeze.  Take a syringe out of the refrigerator and let it reach room temperature for 45 minutes before injecting your dose. Leave the needle cap on the syringe until you are ready to inject your dose.  You may store a prefilled syringe at cool room temperature for up to 14 days. Throw the medicine away if not used within 14 days. Do not put it back into the refrigerator.  Each prefilled syringe is for one use only. Throw it away after one use, even if there is still medicine left inside.  Use a needle and syringe only once and then place them in a puncture-proof sharps container. Follow state or local laws about how to dispose of this container. Keep it out of the reach of children and pets.  Dupilumab is not a rescue medicine for asthma attacks. Use only fast-acting inhalation medicine for an attack. Seek medical attention if your breathing problems get worse quickly, or if you think your asthma medications are not working as well.  If you also use asthma medication or any type of steroid, do not stop using it without your doctor's advice.  What happens if I miss a dose?  Use the medicine as soon as you can, but skip the missed dose if you are more than 7 days late for the injection. Do not use two doses at one time.  What happens if I overdose?  Seek emergency medical attention or call  the Poison Help line at [phone].  What should I avoid while using dupilumab?  Do not receive a live vaccine while using dupilumab. The vaccine may not work as well during this time, and may not fully protect you from disease. Live vaccines include measles, mumps, rubella (MMR), polio, rotavirus, typhoid, yellow fever, varicella (chickenpox), zoster (shingles), and nasal flu (influenza) vaccine.  What are the possible side effects of dupilumab?  Get emergency medical help if you have signs of an allergic reaction: hives, rash, itching; fever, swollen glands, joint pain; feeling light-headed, difficult breathing; swelling of your face, lips, tongue, or throat.  Call your doctor at once if you have:  ?? new or worsening eye pain or discomfort;  ?? vision changes;  ?? watery eyes (your eyes may be more sensitive to light);  ?? feeling like something is in your eye; or  ?? blood vessel inflammation --fever, chest pain, trouble breathing, skin rash, numbness or prickly feeling in your arms or legs.  Common side effects may include:  ?? pain, swelling, burning, or irritation where an injection was given;  ?? eye redness or itching;  ?? puffy eyelids; or  ?? cold sores or fever blisters on your lips or in your mouth.  This is not a complete list of side effects and others may occur. Call your doctor for medical advice about side effects. You may report side effects to FDA at 1-800-FDA-1088.  What other drugs will affect dupilumab?  Other drugs may affect dupilumab, including prescription and over-the-counter medicines, vitamins, and herbal products. Tell your doctor about all your current medicines and any medicine you start or stop using.  Where can I get more information?  Your pharmacist can provide more information about dupilumab.  Remember, keep this and all other medicines out of the reach of children, never share your medicines with others, and use this medication only for the indication prescribed.  Every effort has been made to ensure that the information provided by [employer] ('[employer]') is accurate, up-to-date, and complete, but no guarantee is made to that effect. Drug information contained herein may be time sensitive. [employer] information has been compiled for use by healthcare practitioners and consumers in the [place] and therefore [employer] does not warrant that uses outside of the [place] are appropriate, unless specifically indicated otherwise. [employer]'s drug information does not endorse drugs, diagnose patients or recommend therapy. [employer]'s drug information is an [employer], corporate to assist licensed healthcare practitioners in caring for their patients and/or to serve consumers viewing this service as a supplement to, and not a substitute for, the expertise, skill, knowledge and judgment of healthcare practitioners. The absence of a warning for a given drug or drug combination in no way should be construed to indicate that the drug or drug combination is safe, effective or appropriate for any given patient. Mult[employer] does not assume any responsibility for any aspect of healthcare administered with the aid of information [employer] provides. The information contained herein is not intended to cover all possible uses, directions, precautions, warnings, drug interactions, allergic reactions, or adverse effects. If you have questions about the drugs you are taking, check with your doctor, nurse or pharmacist.  Copyright [id number] [employer] Version: 2.02. Revision date: 08/15/2017.  Care instructions adapted under license by [facility]. If you have questions about a medical condition or this instruction, always ask your healthcare professional. [employer] disclaims any warranty or liability for your use of this information.

## 2018-02-22 ENCOUNTER — Ambulatory Visit (INDEPENDENT_AMBULATORY_CARE_PROVIDER_SITE_OTHER): Payer: Medicare Other

## 2018-02-22 ENCOUNTER — Encounter: Payer: Self-pay | Admitting: Internal Medicine

## 2018-02-22 ENCOUNTER — Ambulatory Visit (INDEPENDENT_AMBULATORY_CARE_PROVIDER_SITE_OTHER): Payer: Medicare Other | Admitting: Internal Medicine

## 2018-02-22 ENCOUNTER — Telehealth: Payer: Self-pay | Admitting: Internal Medicine

## 2018-02-22 VITALS — BP 122/70 | HR 62 | Temp 98.2°F | Ht 70.0 in | Wt 210.0 lb

## 2018-02-22 DIAGNOSIS — Z0000 Encounter for general adult medical examination without abnormal findings: Secondary | ICD-10-CM

## 2018-02-22 DIAGNOSIS — J449 Chronic obstructive pulmonary disease, unspecified: Secondary | ICD-10-CM

## 2018-02-22 DIAGNOSIS — Z23 Encounter for immunization: Secondary | ICD-10-CM

## 2018-02-22 NOTE — Unmapped (Signed)
Addended by: Inis Sizer A on: 02/22/2018 10:37 AM     Modules accepted: Level of Service

## 2018-02-22 NOTE — Progress Notes (Signed)
   Subjective:    Patient ID: Laura Mendez, female    DOB: [birth year], 77 y.o.   MRN: [id number]  HPI Here for medicare wellness and physical, no new complaints. Please see A/P for status and treatment of chronic medical problems.   Diet: heart healthy Physical activity: sedentary Depression/mood screen: negative Hearing: intact to whispered voice Visual acuity: grossly normal, performs annual eye exam  ADLs: capable Fall risk: none Home safety: good Cognitive evaluation: intact to orientation, naming, recall and repetition EOL planning: adv directives discussed  I have personally reviewed and have noted 1. The patient's medical and social history - reviewed today no changes 2. Their use of alcohol, tobacco or illicit drugs 3. Their current medications and supplements 4. The patient's functional ability including ADL's, fall risks, home safety risks and hearing or visual impairment. 5. Diet and physical activities 6. Evidence for depression or mood disorders 7. Care team reviewed and updated (available in snapshot)  Review of Systems  Constitutional: Negative.   HENT: Negative.   Eyes: Negative.   Respiratory: Positive for cough and shortness of breath. Negative for chest tightness.        Stable  Cardiovascular: Negative for chest pain, palpitations and leg swelling.  Gastrointestinal: Negative for abdominal distention, abdominal pain, constipation, diarrhea, nausea and vomiting.  Musculoskeletal: Negative.   Skin: Negative.   Neurological: Negative.   Psychiatric/Behavioral: Negative.       Objective:   Physical Exam  Constitutional: She is oriented to person, place, and time. She appears well-developed and well-nourished.  HENT:  Head: Normocephalic and atraumatic.  Eyes: EOM are normal.  Neck: Normal range of motion.  Cardiovascular: Normal rate and regular rhythm.  Pulmonary/Chest: Effort normal and breath sounds normal. No respiratory distress. She has no  wheezes. She has no rales.  Abdominal: Soft. Bowel sounds are normal. She exhibits no distension. There is no tenderness. There is no rebound.  Musculoskeletal: She exhibits no edema.  Neurological: She is alert and oriented to person, place, and time. Coordination normal.  Skin: Skin is warm and dry.  Psychiatric: She has a normal mood and affect.   Vitals:   02/22/18 0825  BP: 122/70  Pulse: 62  Temp: 98.2 F (36.8 C)  TempSrc: Oral  SpO2: 95%  Weight: 210 lb (95.3 kg)  Height: 5\' 10"  (1.778 m)      Assessment & Plan:

## 2018-02-22 NOTE — Telephone Encounter (Signed)
Pt came in today for her Xolair injection. Her Dermatologist is recommending that she start taking Dupixent. Pt is wanting CY's opinion on this.  CY - please advise. Thanks.

## 2018-02-22 NOTE — Telephone Encounter (Signed)
I would expect Dupixent to help with her lungs, so it would be ok to order change from Xolair to [place] to also cover urticaria/ rash.

## 2018-02-22 NOTE — Patient Instructions (Signed)

## 2018-02-23 NOTE — Telephone Encounter (Signed)
oung, Kasandra Knudsen, MD  You Yesterday (10:09 AM)      I would expect Dupixent to help with her lungs, so it would be ok to order change from Xolair to [place] to also cover urticaria/ rash.       Documentation

## 2018-02-23 NOTE — Telephone Encounter (Signed)
Please see 02/22/18 encounter for updates. Closing encounter.

## 2018-02-23 NOTE — Telephone Encounter (Signed)
Duplicate, closing encounter.

## 2018-02-23 NOTE — Telephone Encounter (Signed)
Pt's dermatologist is putting her on [place]. Can she take the Xolair as well? Please advise. Will route to Katie and CY.

## 2018-02-23 NOTE — Telephone Encounter (Signed)
Noted, I'll let pt know to try Dupixent, since it also covers urticaria/ rash. Called pt and lm with cell vm, asking her to call if she has any questions.

## 2018-02-24 NOTE — Assessment & Plan Note (Signed)
Seeing pulmonary and they are adjusting her regimen. Slightly worse lately due to ragweed but no indication for antibiotics or steroids today.

## 2018-02-24 NOTE — Assessment & Plan Note (Signed)
Flu shot up to date. Pneumonia up to date. Shingrix counseled. Tetanus up to date. Colonoscopy up to date. Mammogram up to date, pap smear aged out and dexa up to date. Counseled about sun safety and mole surveillance. Counseled about the dangers of distracted driving. Given 10 year screening recommendations.  

## 2018-03-11 DIAGNOSIS — M79671 Pain in right foot: Secondary | ICD-10-CM | POA: Insufficient documentation

## 2018-03-17 ENCOUNTER — Encounter: Admit: 2018-03-17 | Discharge: 2018-03-18 | Payer: MEDICARE

## 2018-03-17 DIAGNOSIS — L309 Dermatitis, unspecified: Principal | ICD-10-CM

## 2018-03-17 MED ORDER — PREDNISONE 5 MG TABLET
ORAL_TABLET | Freq: Every day | ORAL | 1 refills | 0 days | Status: CP
Start: 2018-03-17 — End: 2018-05-31

## 2018-03-17 NOTE — Unmapped (Signed)
Take 10mg  of prednisone for 2 days, then 5 mg for 2 days, then stop prednisone.

## 2018-03-17 NOTE — Unmapped (Signed)
Dermatology Follow-up Note    A/P:    Eczematous dermatitis, clear after 3 weeks of prednisone 20mg  daily while awaiting dupilumab:  ?? She has failed methotrexate due to side effects, and triamcinolone and clobetasol ointment due to lack of efficacy  ?? Discussed need to taper prednisone; take pred 10mg  daily for 2-3 days, then 5mg  daily for a week, then stop  ?? Start dupilumab per below  ?? Day 0: inject dupilumab subcutaneously 600 mg on day 0. Day 15: inject dupilumab 300 mg every 14 days.  ?? Dupilumab risks discussion: After discussing risks including conjunctivitis, herpes infection, rash, and injection site reactions, she was agreeable to start dupilumab. She knows to call if he experiences any of these side effects.  ?? Will followup on status of dupilumab      Pending dupilumab approval      CC:  Rash    HPI:  Courtney Walker is a friendly 77 y.o. female last seen 02/20/18  for eczematous dermatitis here for follow-up. Since last visit, her rash has been flaring severely with terrible itching on her arms, back, chest, and legs despite using triamcinolone ointment and clobetasol ointment under occlusion. We ordered dupilumab and started her on a prednisone taper; however, she never go the dupilumab.  She continued on prednisone 20mg  daily for the last month.  Her skin cleared after a few days of use.  She took her last dose yesterday.      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   Neuro: Alert and oriented, and answers questions appropriately.  Skin: Per patient request, focused exam with inspection and palpation of the face, neck, arms, back, legs was performed today. Findings were normal with exception of the following:  - Skin clear  - All other areas examined were normal or had no significant findings

## 2018-03-20 ENCOUNTER — Telehealth: Payer: Self-pay | Admitting: Internal Medicine

## 2018-03-20 NOTE — Telephone Encounter (Signed)
Telephone   02/22/2018 [facility]    Deneise Lever, MD  Pulmonary Disease   Medication Management  Reason for call   Conversation: Medication Management  (Newest Message First)  February 23, 2018  Me       0:35 PM  Note    Duplicate, closing encounter.    Me       5:24 PM  Note    Noted, I'll let pt know to try Dupixent, since it also covers urticaria/ rash. Called pt and lm with cell vm, asking her to call if she has any questions.          5:22 PM  Welchel, Elmer Sow, CMA routed this conversation to [employer], Elmer Sow, CMA       5:22 PM  Note    oung, Kasandra Knudsen, MD  You Yesterday (10:09 AM)         I would expect Dupixent to help with her lungs, so it would be ok to order change from Xolair to [place] to also cover urticaria/ rash.       Documentation               5:16 PM  You routed this conversation to [employer], CMA . Deneise Lever, MD  Me       5:14 PM  Note    Pt's dermatologist is putting her on [place]. Can she take the Xolair as well? Please advise. Will route to Katie and CY.    February 22, 2018        11:58 AM  Lorane Gell, CMA routed this conversation to Me  Philipp Ovens, [place], LPN  to Me        00:93 AM  Do you need anything else regarding this message? If not I will sign and close the encounter. Thanks.  Deneise Lever, MD  to Lorane Gell, CMA      10:09 AM  Note    I would expect Dupixent to help with her lungs, so it would be ok to order change from Xolair to [place] to also cover urticaria/ rash.           9:53 AM  Randa Spike, CMA routed this conversation to Deneise Lever, MD  Randa Spike, CMA       9:52 AM  Note    Pt came in today for her Xolair injection. Her Dermatologist is recommending that she start taking Dupixent. Pt is wanting CY's opinion on this.  CY - please advise. Thanks.          9:39 AM  Theodoro Grist  A routed this conversation to [facility]riage Quentin Cornwall, Lannette Donath  to Theodoro Grist A        9:37 AM  in office today for xolair shot states that dermotolgist want her to start on dupixent and she want you to know and wanted to know if there would be any conflict, please advise  Additional Documentation   Encounter Info:   Billing Info,   History,   Allergies,   Detailed Report     Orders Placed    None  Medication Renewals and Changes     None    Medication List   Visit Diagnoses     None    Problem List    I gave pt your recs. Again. She is sob and wants to restart her xolair. Please call pt or advise  what you would have me to do. Pt is coming tomorrow to show me the paperwork that says she was approved. I asked her for which drug and said she didn't see it.  Noted CY, I'll called pt and let her know, you want her to try the dupixent first. Called [email or web address] if she had any questions. Pt is coming in 2:00, 03/22/18. She has a lot of paperwork she wants me to look at and let her know what it is.

## 2018-03-20 NOTE — Telephone Encounter (Signed)
I expect Dupixent to work well for her lungs as well as her skin. She should try it by itself first, stopping Xolair. If she has more breathing problems while on Dupxient, then we can meet and look at options.

## 2018-03-23 NOTE — Telephone Encounter (Signed)
Pt. Came in Wed. Afternoon, we went over all the paperwork she had.I called her dermatologist office an lmtcb, I have not heard from them. I will call their office Friday(03/24/18) to see if I can speak to a person. Pt has been approved for Xolair and Dupixent, but nothing's been done as far as rx and ordering med. Pt is supposed to get the [place] from her dermatologist. The Dr."s hoping th pt's skin will clear up with [place]. She had paperwork from a co-pay assit. Program: Xolair approved, Pharm.: Xolair approved. Nothing on Dupixent. Pt. Would rather [place] to do. Explained to pt again CY wants her try Dupixent first and if it doesn't help her breathing he'll put her back on Xolair.

## 2018-03-24 MED ORDER — DUPILUMAB 300 MG/2 ML SUBCUTANEOUS SYRINGE
SUBCUTANEOUS | 0 refills | 0.00000 days | Status: CP
Start: 2018-03-24 — End: 2018-06-26

## 2018-03-24 MED ORDER — DUPILUMAB 300 MG/2 ML SUBCUTANEOUS SYRINGE: mL | 0 refills | 0 days | Status: AC

## 2018-03-24 NOTE — Unmapped (Signed)
Received call regarding to Dupilumab 300 MG needing to be sent to [facility] to receive her mediation. Patient stated that both her and the pharmacist are waiting.

## 2018-03-24 NOTE — Telephone Encounter (Signed)
Pt call me today saying she needed to speak to Kathlee Nations about her Dupixent. I explained to her again she needs to talk to her Dermatologist's office because she's the one that is putting her on [place].

## 2018-03-25 NOTE — Unmapped (Signed)
Spoke with patient re: contact at pharmacy.    She recalls the name Dimas Millin from [facility] [phone] with Dr. Fannie Knee.    Off Xolair.

## 2018-03-25 NOTE — Unmapped (Signed)
Prescription sent to [facility] 

## 2018-03-27 NOTE — Telephone Encounter (Signed)
Pt wants [place] to do her Dupixent and bill for it.. Her dermatologist prescribed it, so I doubt their going to give up the credit for it. Nothing further needed at this time.

## 2018-03-28 NOTE — Unmapped (Signed)
Spoke with Dimas Millin from [facility].  She clarified the following:  - She cannot give the injections unless Dr. Dr. Maple Hudson is prescribing  - Dr. Maple Hudson might consider prescribing for asthma, but wants to see if it works (under my prescribing)  - So for now patient will need to self inject     Called patient to update her.  She reports breathing is bad, skin covered with rash.  Asked her to resume prednisone as bridge until dupilumab is delivered.  She will call once it is delivered and we will get her in for teaching.

## 2018-03-28 NOTE — Unmapped (Signed)
Called [facility].  They don't see my prescription.  They see a prescription from Dr. Earnest Conroy.  Per my records Dr. Earnest Conroy sent a prescription on 02/20/18.  However, the representative says it is dated today.  She thinks maybe it has been passed along to the specialty area.  She provides the following number: [phone].  I called this number and spoke with a representative who says that they have not received any prescription from [place] (despite it showing as transmitted in epic).  She transferred me to the pharmacist and I provided the prescription verbally.  She placed it as a stat order.      She asked that patient call the number above to approve and request it be shipped.

## 2018-03-29 DIAGNOSIS — C50411 Malignant neoplasm of upper-outer quadrant of right female breast: Secondary | ICD-10-CM | POA: Diagnosis not present

## 2018-04-03 NOTE — Unmapped (Signed)
Spoke with patient - she was contacted last Friday (03/31/18), but could not approve the shipment because the copay would be over $500 and she cannot afford it.  She is now moving to patient assistance through our specialty pharmacy with [facility] Staff assisting with the Dupixent MyWay application.

## 2018-04-08 NOTE — Unmapped (Signed)
----- Message from Vanessa Ralphs sent at 04/06/2018  1:33 PM EST -----  Regarding: RE: Patient Assistance  Hi Dr. Orma Flaming!    I have some great news! Ms. Peeters has finally been approved to receive free medication through [employer]ent MyWay! We are very excited about this!    Dup[employer] MyWay does require their Medicare patients to spend $250 out of pocket on pharmacy expenses before they will approve a patient. Her approval is only until 05/23/2018. However, the program is currently reconsidering the spending requirement as this was the first year they tried it and they do not want to cause any kind of delays in therapy. They are also holding onto her application and once it's decided if the spending requirement will continue or not, they will re-evaluate her application at that time or let us know the spending is required.    The great news is she has the PAF Copay Relief grant and I think she could fill on her insurance, use the grant, then we can show her spending to [employer] in January. I am going to hold onto her referral for now and I will be contacting her and [employer] again end of December/January when they have made a decision on the spending.    Please let me know if I can do anything else for her at this time! Thank you so much for all your help with this as well! I'm so excited she can finally get her Dupixent!    --Aundra Millet  ----- Message -----  From: Benjie Karvonen, MD  Sent: 04/03/2018   4:25 PM EST  To: Vanessa Ralphs  Subject: RE: Patient Assistance                           You're awesome!  Thank you so much for your care for her!  She and I both appreciate it more than we can say.  Lupita Leash  ----- Message -----  From: Vanessa Ralphs  Sent: 04/03/2018   4:16 PM EST  To: Benjie Karvonen, MD  Subject: RE: Patient Assistance                           Hi Dr. Orma Flaming!    Thank you so much for this information!    There has been some confusion on the part of the free drug program with [employer] MyWay. Initially, they had grouped her into the commercial patients of their program and therefore did not access her for their Medicare free drug/unaffordable copay program. I believe this is why [facility] has been calling her to set up shipment.    I have spoken with the Dupixent MyWay program, and they have recognized she is a Medicare patient and are proceeding with processing her free drug application. (Apparently also [facility] just kept calling her asking her to schedule shipment, which she cannot afford so she is just not going to schedule anything.)    Her free drug application is now officially pending and I have asked them to expedite it. This grant you obtained will be super helpful in getting her started on the medication! I will call and talk with her tomorrow. We have gone back and forth with some phone calls, so she is aware of everything and I know she is super eager to start therapy! Thank you so much again! This is really helpful!    --Megan  -----  Message -----  From: Benjie Karvonen, MD  Sent: 04/03/2018   1:03 PM EST  To: Vanessa Ralphs  Subject: Patient Assistance                               Hi - Thanks so much for helping this patient.  She has gotten a lot of balls rolling and I have been getting confused as to which direction we are going.  Sounds like she cannot afford the $500+ for the copay through [facility] and so you are continuing to pursue Dupixent MyWay patient assistance.      I just wanted to let you know that I have a [employer] Co-Pay Relief letter the patient brought in for me to review.  I'm not sure the status of this or whether it will even be useful, but wanted you to be aware that she was approved for $1750 over a period of 12 months:  Cardholder number: [id number]  BIN: [id number]  PCN: [id number]  Group [id number]  Www.copays.org    I will scan to Epic.

## 2018-04-10 DIAGNOSIS — H01004 Unspecified blepharitis left upper eyelid: Secondary | ICD-10-CM | POA: Diagnosis not present

## 2018-04-12 ENCOUNTER — Other Ambulatory Visit: Payer: Self-pay | Admitting: Internal Medicine

## 2018-04-13 ENCOUNTER — Telehealth: Payer: Self-pay | Admitting: Internal Medicine

## 2018-04-13 NOTE — Telephone Encounter (Signed)
Attempted to contact pt. I did not receive an answer. I have left a message for pt to return our call.  

## 2018-04-14 ENCOUNTER — Ambulatory Visit: Admit: 2018-04-14 | Discharge: 2018-04-15 | Payer: MEDICARE

## 2018-04-14 DIAGNOSIS — L309 Dermatitis, unspecified: Principal | ICD-10-CM

## 2018-04-14 NOTE — Unmapped (Signed)
After obtaining consent, and per orders of Dr. Orma Flaming, injection of Dupixent given by Irene Limbo. Patient instructed to remain in clinic for 20 minutes afterwards, and to report any adverse reaction to me immediately.

## 2018-04-14 NOTE — Telephone Encounter (Signed)
Called and spoke with pt to see if she knew the reason for why Joellen Jersey had called her. Pt stated Katie told her in the message that pt was needing to reschedule her appt. I saw that the appt had already been changed from 11/27 to 11/25. Nothing further needed.

## 2018-04-17 ENCOUNTER — Ambulatory Visit: Payer: Medicare Other | Admitting: Internal Medicine

## 2018-04-17 ENCOUNTER — Encounter: Payer: Self-pay | Admitting: Internal Medicine

## 2018-04-17 VITALS — BP 120/74 | HR 77 | Ht 70.0 in | Wt 207.0 lb

## 2018-04-17 DIAGNOSIS — J449 Chronic obstructive pulmonary disease, unspecified: Secondary | ICD-10-CM | POA: Diagnosis not present

## 2018-04-17 DIAGNOSIS — R21 Rash and other nonspecific skin eruption: Secondary | ICD-10-CM | POA: Diagnosis not present

## 2018-04-17 MED ORDER — COMPRESSOR NEBULIZER MISC: 1.0000 | Freq: Once | 0 refills | Status: AC

## 2018-04-17 MED ORDER — IPRATROPIUM-ALBUTEROL 0.5-2.5 (3) MG/3ML IN SOLN: 3.0000 mL | Freq: Four times a day (QID) | RESPIRATORY_TRACT | 12 refills | Status: DC | PRN

## 2018-04-17 NOTE — Patient Instructions (Signed)
Suggest you give Dupixent another month or two, and talk with your dermatologist about what to expect.  Order- new DME - order- new compressor nebulizer and duoneb- scripts printed

## 2018-04-17 NOTE — Assessment & Plan Note (Signed)
I see nothing looks specific.  There is a little erythema on her back which might be from excoriation.  A note from the dermatology office giving injection suggested their diagnosis is eczema.  A seasonal component of dry skin might be considered as well.

## 2018-04-17 NOTE — Assessment & Plan Note (Signed)
She had a great deal of confidence in [place] and is anxious now since dermatology asked changed to [place].  We expect some progression of her chronic lung disease with aging.  Worsening symptoms also coincide with seasonal change of weather now, as well as her change to [place].  I have asked her to give Dupixent at least to few more weeks of trial at minimum and to discuss timeline goals with her dermatologist. We are also updating her old compressor nebulizer machine and DuoNeb.  These have been very helpful in the past during exacerbations.  She is up-to-date on flu shot.

## 2018-04-17 NOTE — Progress Notes (Signed)
Patient ID: Laura Mendez, female    DOB: 03/25/1941, 77 y.o.   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 Fib Labs 11/23/16- allergy profile total IgE 409 on Xolair, elevated specific IgE for dust mite and cockroach. EOS 3,300, Nl ANA and alpha-gal Derm Path report reviewed- Spongiotic/ eczema process- does not describe it as urticaria. Xolair was changed to [facility] June, 2018, to see if Xolair was causing her rash-no improvement after 5 months Office Spirometry 12/20/2017-very severe obstructive airways disease.  FVC 2.09/60%, FEV1 0.90/34%, ratio 0.43 ---------------------------------------------------------------.  12/20/2017- 77 year old female former smoker followed for severe asthma/COPD/Nucala, complicated by allergic rhinitis, paroxysmal A. Fib, rash ----Asthma with COPD: Pt is having SOB and coughing more. Pt continues to take Xolair Injections monthly but seems to "run out: about the 3rd week after injecting.  Surgery on 12-30-17 on Right Breast and about 4-5 days prior to she will have radioactive seeds placed in 3 spots in her breast. Dr, Donne Hazel She was switched from Xolair to [facility] over 4 months ago because of persistent, difficult rash being followed by [facility] dermatology.  Rash has not changed.  Improvement after each Nucala injection lasts about 3 weeks.  Has noted occasional dry cough over the last 2 months possibly related to weather-managed with cough drops and sips of liquids.  No severe wheezing but continues with her inhaled meds. CXR 06/22/2017- IMPRESSION: Hyperinflation and bilateral scarring. No acute superimposed Process. Office Spirometry 12/20/2017-very severe obstructive airways disease.  FVC 2.09/60%, FEV1 0.90/34%, ratio 0.43  04/17/2018- 77 year old female former smoker followed for severe asthma/COPD/Nucala, complicated by allergic rhinitis, paroxysmal A. Fib, rash, breast cancer  R,  Xolair was changed to Dupixent at request of her dermatologist, for eczema, with coverage also asthma/COPD.  Patient is being given by her dermatologist. -----4 month follow up for asthma. Per patient she is currently using Dupixent. Received her first injection 11/22. Since then, she has been more congested and increased SOB.  Wixela 250, Albuterol hfa,  She is unsure about the effectiveness of Dupixent but had it less than 2 weeks ago for the first dose. She says in the last couple of weeks she has been sleepier with less energy, more cough at night productive of white sputum and more dyspnea on exertion climbing stairs.  No acute event.  We realize the symptoms may be associated with the change of seasons. She is up-to-date on flu vaccine. Anticipating potential winter viral exacerbations-her compressor nebulizer machine is very old and she needs updated prescription. CXR 12/20/17-  IMPRESSION: COPD. Stable pleuroparenchymal scarring. No acute pneumonia nor CHF. Mitral annular calcification. Calcification in the wall of the aortic arch.  Review of Systems-see HPI   + = positive Constitutional:   No-   weight loss, night sweats, fevers, chills, fatigue, lassitude. HEENT:   No-  headaches, difficulty swallowing, tooth/dental problems, sore throat,       No-  sneezing, +itching, ear ache,  nasal congestion, +post nasal drip,  CV:  No-   chest pain, orthopnea, PND, swelling in lower extremities, anasarca, dizziness, palpitations Resp:  + shortness of breath with exertion or at rest- unchanged             No-   productive cough,  + non-productive cough,  No-  coughing up of blood.              No-   change in color  of mucus.  + wheezing.   Skin:  +rash or lesions. GI:  No-   heartburn, indigestion, abdominal pain, nausea, vomiting, GU:  MS:  +   joint pain or swelling.   Neuro- nothing unusual  Psych:  No- change in mood or affect. No depression or anxiety.  No memory loss  Objective:      General- Alert, Oriented, Affect-appropriate, Distress- none acute, Pleasant, + Overweight Skin- + numerous unremarkable keratoses on her back with mild diffuse erythema suggesting excoriation. Lymphadenopathy- none Head- atraumatic            Eyes- Gross vision intact, PERRLA, conjunctivae clear secretions            Ears- Hearing, canals normal            Nose- Mild turbinate edema, No-Septal dev, mucus, polyps, erosion, perforation             Throat- Mallampati II , mucosa -not red, drainage- none, tonsils- atrophic Neck- flexible , trachea midline, no stridor , thyroid nl, carotid no bruit Chest - symmetrical excursion , unlabored           Heart/CV- RRR +, no murmur , no gallop  , no rub, nl s1 s2                           - JVD- none , edema- none, stasis changes- none, varices- none           Lung- +clear to P&A/ distant/unlabored, wheeze- none, cough+ minimal, dullness-none, rub- none           Chest wall-  Abd-  Br/ Gen/ Rectal- Not done, not indicated Extrem- cyanosis- none, clubbing, none, atrophy- none, strength- nl Neuro- grossly intact to observation

## 2018-04-18 ENCOUNTER — Telehealth: Payer: Self-pay | Admitting: Internal Medicine

## 2018-04-18 DIAGNOSIS — C50411 Malignant neoplasm of upper-outer quadrant of right female breast: Secondary | ICD-10-CM | POA: Diagnosis not present

## 2018-04-18 NOTE — Telephone Encounter (Signed)
Patient called and somehow [facility] received the order thoughit should have went to [facility] I have talked with Corene Cornea who is working to get the order processed now and see what the cost maybe and will contact the patient. She will then decided if she would just rather get the medication sent to her Regular pharmacy. Nothing further needed at this time.

## 2018-04-18 NOTE — Telephone Encounter (Signed)
Patient called stating the charges for the nebulizer machine and medication is too expensive; pt states she have 3 or 4 nebulizer machines at the house; pt states she just need medication to be called into her pharmacy so she can use one of the machines at the house to [place]; pt contact # [phone]

## 2018-04-19 ENCOUNTER — Ambulatory Visit: Payer: Medicare Other | Admitting: Internal Medicine

## 2018-04-26 DIAGNOSIS — J449 Chronic obstructive pulmonary disease, unspecified: Secondary | ICD-10-CM | POA: Diagnosis not present

## 2018-04-27 ENCOUNTER — Telehealth: Payer: Self-pay | Admitting: Internal Medicine

## 2018-04-27 NOTE — Telephone Encounter (Signed)
Called and spoke to patient, patient states she is being set up to give her Dupixent Injections at home to give herself every 2 weeks. She states she cannot afford to come in every 2 weeks and pay $30 dollars at every visit. She states she does not think she will be able to do the shots herself. She states she is just does not feel comfortable giving herself injections. She is asking if she can come in without paying the co-pay. I explained to the patient that this would not be possible because we have to adhere to our policies. I offered the patient to come in for further teaching however she declined. Nothing further is needed at this time.

## 2018-04-28 ENCOUNTER — Ambulatory Visit: Admit: 2018-04-28 | Discharge: 2018-04-29 | Payer: MEDICARE

## 2018-04-28 DIAGNOSIS — L309 Dermatitis, unspecified: Principal | ICD-10-CM

## 2018-04-28 NOTE — Unmapped (Signed)
After obtaining consent, and per orders of Dr. Earnest Conroy, injection of Dupixent 300mg /51ml given by Irene Limbo. Patient instructed to remain in clinic for 20 minutes afterwards, and to report any adverse reaction to me immediately.

## 2018-05-03 ENCOUNTER — Ambulatory Visit: Payer: Medicare Other | Admitting: Cardiology

## 2018-05-03 ENCOUNTER — Encounter: Payer: Self-pay | Admitting: Cardiology

## 2018-05-03 VITALS — BP 110/60 | HR 66 | Ht 70.0 in | Wt 207.8 lb

## 2018-05-03 DIAGNOSIS — Z8673 Personal history of transient ischemic attack (TIA), and cerebral infarction without residual deficits: Secondary | ICD-10-CM

## 2018-05-03 DIAGNOSIS — Z7901 Long term (current) use of anticoagulants: Secondary | ICD-10-CM | POA: Diagnosis not present

## 2018-05-03 DIAGNOSIS — J4520 Mild intermittent asthma, uncomplicated: Secondary | ICD-10-CM

## 2018-05-03 DIAGNOSIS — I4892 Unspecified atrial flutter: Secondary | ICD-10-CM

## 2018-05-03 NOTE — Progress Notes (Signed)
Cardiology Office Note:    Date:  05/03/2018   ID:  Laura Mendez, DOB 26-Oct-1940, MRN [id number]  PCP:  Hoyt Koch, MD  Cardiologist:  Candee Furbish, MD  Electrophysiologist:  None   Referring MD: Hoyt Koch, *     History of Present Illness:    Laura Mendez is a 77 y.o. female here for follow-up of atrial flutter.  She has been seen in the past by Roderic Palau, NP in the atrial fibrillation clinic.  This was newly diagnosed on 12/11/2015.  She auto converted after receiving 5 mg of metoprolol.  Asthma exacerbate.  Xarelto.  No major bleeding.  EF normal but left atrium was severely dilated 55 mm.  Cardizem was stopped because of rash and verapamil was started.  Methotrexate was started by dermatology.  Did not seem to be related to drugs.  Chads Vascor is 5 including TIA prior left eye partial vision loss.  Also diagnosed with breast cancer.  Past Medical History:  Diagnosis Date  . Asthma   . Chronic airway obstruction, not elsewhere classified   . Diastolic dysfunction without heart failure   . Disorder of bone and cartilage, unspecified   . Dysrhythmia    A-Fib  . Family history of colon cancer   . Family history of kidney cancer   . Family history of ovarian cancer   . Paroxysmal atrial flutter ([place])   . Stroke [facility])    TIA  . TIA (transient ischemic attack)   . Unspecified asthma(493.90)     Past Surgical History:  Procedure Laterality Date  . ABDOMINAL HYSTERECTOMY    . BREAST LUMPECTOMY WITH RADIOACTIVE SEED LOCALIZATION Right 12/30/2017   Procedure: BREAST LUMPECTOMY WITH RADIOACTIVE SEED LOCALIZATION X'S 3;  Surgeon: Rolm Bookbinder, MD;  [facility];  Service: General;  Laterality: Right;  . BUNIONECTOMY Bilateral   . EYE SURGERY Bilateral    cataract removal    Current Medications: Current Meds  Medication Sig  . albuterol (PROVENTIL HFA;VENTOLIN HFA) 108 (90 Base) MCG/ACT inhaler TAKE 2 PUFFS BY MOUTH EVERY 4 HOURS AS  NEEDED  . cholecalciferol (VITAMIN D) 1000 units tablet Take 1,000 Units by mouth daily.  . clobetasol ointment (TEMOVATE) 4.58 % Apply 1 application topically 2 (two) times daily as needed (for skin irritation).  . fish oil-omega-3 fatty acids 1000 MG capsule Take 1 g by mouth daily.   . Fluticasone-Salmeterol (WIXELA INHUB) 250-50 MCG/DOSE AEPB Inhale 1 puff into the lungs 2 (two) times daily.  Marland Kitchen ipratropium-albuterol (DUONEB) 0.5-2.5 (3) MG/3ML SOLN Take 3 mLs by nebulization every 6 (six) hours as needed.  Marland Kitchen letrozole (FEMARA) 2.5 MG tablet Take 1 tablet (2.5 mg total) by mouth daily.  Marland Kitchen MEGARED OMEGA-3 KRILL OIL 500 MG CAPS Take 500 mg by mouth daily.   . Multiple Vitamin (MULTIVITAMIN) tablet Take 1 tablet by mouth daily.   . traMADol (ULTRAM) 50 MG tablet Take 1 tablet (50 mg total) by mouth every 6 (six) hours as needed.  . triamcinolone ointment (KENALOG) 0.1 % Apply 1 application topically 2 (two) times daily as needed (for itching).  Alveda Reasons 20 MG TABS tablet TAKE 1 TABLET BY MOUTH EVERY DAY WITH SUPPER (Patient taking differently: TAKE 1 TABLET BY MOUTH EVERY DAY)   Current Facility-Administered Medications for the 05/03/18 encounter (Office Visit) with Jerline Pain, MD  Medication  . Mepolizumab SOLR 100 mg     Allergies:   Clarithromycin; Ibuprofen; Codeine; Codeine phosphate; Diltiazem; Tape; Tapentadol;  and Verapamil hcl er   Social History   Socioeconomic History  . Marital status: Divorced    Spouse name: Not on file  . Number of children: Not on file  . Years of education: Not on file  . Highest education level: Not on file  Occupational History  . Occupation: retired  Scientific laboratory technician  . Financial resource strain: Not on file  . Food insecurity:    Worry: Not on file    Inability: Not on file  . Transportation needs:    Medical: Not on file    Non-medical: Not on file  Tobacco Use  . Smoking status: Former Smoker    Years: 0.00    Last attempt to quit:  05/24/1977    Years since quitting: 40.9  . Smokeless tobacco: Never Used  Substance and Sexual Activity  . Alcohol use: No  . Drug use: No  . Sexual activity: Not on file  Lifestyle  . Physical activity:    Days per week: Not on file    Minutes per session: Not on file  . Stress: Not on file  Relationships  . Social connections:    Talks on phone: Not on file    Gets together: Not on file    Attends religious service: Not on file    Active member of club or organization: Not on file    Attends meetings of clubs or organizations: Not on file    Relationship status: Not on file  Other Topics Concern  . Not on file  Social History Narrative  . Not on file     Family History: The patient's family history includes COPD in her father; Cancer in her other; Cirrhosis in her sister; Colon cancer in her cousin, maternal uncle, and maternal uncle; Colon cancer (age of onset: 43) in her mother; Heart attack in her paternal uncle; Heart disease in her maternal grandfather; Kidney cancer (age of onset: 67) in her sister; Kidney cancer (age of onset: 66) in her brother; Lung cancer in her sister; Melanoma (age of onset: 36) in her sister; Ovarian cancer in her maternal aunt and maternal grandmother; Ovarian cancer (age of onset: 68) in her sister; Parkinson's disease in her brother; Rectal cancer in her paternal grandfather. There is no history of Breast cancer.  ROS:   Please see the history of present illness.    Positive for occasional shortness of breath, no chest pain syncope bleeding orthopnea PND all other systems reviewed and are negative.  EKGs/Labs/Other Studies Reviewed:    The following studies were reviewed today: Echo 2017-Left ventricle: The cavity size was mildly dilated. Wall thickness was increased in a pattern of mild LVH. Systolic function was normal. The estimated ejection fraction was in the range of 55% to 60%. Wall motion was normal; there were no regional wall  motion abnormalities. Doppler parameters are consistent with abnormal left ventricular relaxation (grade 1 diastolic dysfunction). Doppler parameters are consistent with high ventricular filling pressure. - Mitral valve: Severely calcified annulus. There was mild regurgitation. Valve area by continuity equation (using LVOT flow): 2.36 cm^2. - Left atrium: The atrium was severely dilated. - Right ventricle: The cavity size was normal. Wall thickness was normal. Systolic function was normal. - Right atrium: The atrium was mildly dilated. - Atrial septum: No defect or patent foramen ovale was identified by color flow Doppler. - Tricuspid valve: There was trivial regurgitation. - Pulmonary arteries: Systolic pressure was within the normal range. PA peak pressure: 24 mm Hg (  S).   EKG:  EKG is not ordered today.  Prior from 12/20/2017 shows sinus rhythm without any other significant abnormalities.  Nonspecific T wave changes.  Recent Labs: 11/16/2017: ALT 23 12/22/2017: BUN 10; Creatinine, Ser 0.81; Hemoglobin 14.4; Platelets 260; Potassium 3.9; Sodium 141  Recent Lipid Panel    Component Value Date/Time   CHOL 214 (H) 01/13/2016 0922   TRIG 164.0 (H) 01/13/2016 0922   TRIG 183 (H) 03/04/2006 1034   HDL 52.00 01/13/2016 0922   CHOLHDL 4 01/13/2016 0922   VLDL 32.8 01/13/2016 0922   LDLCALC 129 (H) 01/13/2016 0922   LDLDIRECT 106.0 08/13/2015 0743    Physical Exam:    VS:  BP 110/60   Pulse 66   Ht 5\' 10"  (1.778 m)   Wt 207 lb 12.8 oz (94.3 kg)   SpO2 95%   BMI 29.82 kg/m     Wt Readings from Last 3 Encounters:  05/03/18 207 lb 12.8 oz (94.3 kg)  04/17/18 207 lb (93.9 kg)  02/22/18 210 lb (95.3 kg)     GEN:  Well nourished, well developed in no acute distress HEENT: Normal NECK: No JVD; No carotid bruits LYMPHATICS: No lymphadenopathy CARDIAC: RRR, no murmurs, rubs, gallops RESPIRATORY: Poor air movement bilaterally without rales, wheezing or rhonchi    ABDOMEN: Soft, non-tender, non-distended MUSCULOSKELETAL:  No edema; No deformity  SKIN: Warm and dry NEUROLOGIC:  Alert and oriented x 3 PSYCHIATRIC:  Normal affect   ASSESSMENT:    1. Paroxysmal atrial flutter ([place])   2. Chronic anticoagulation   3. History of TIA (transient ischemic attack)   4. Mild intermittent chronic asthma without complication    PLAN:    In order of problems listed above:  Typical atrial flutter -Episode 2017 with auto conversion. -If returns, ablative therapy would be helpful.  Chronic anticoagulation - She used to take Plavix and aspirin because of prior TIA/thrombosis to left eye.  Continue with Xarelto 20 mg a day.  She has a friend who works for [employer] and she was asking about the differences.  After discussing, she will continue with Xarelto.  Chronic asthma - Inhalers noted.  Dr. Annamaria Boots.  Medications reviewed.   Medication Adjustments/Labs and Tests Ordered: Current medicines are reviewed at length with the patient today.  Concerns regarding medicines are outlined above.  No orders of the defined types were placed in this encounter.  No orders of the defined types were placed in this encounter.   Patient Instructions  Medication Instructions:  No change If you need a refill on your cardiac medications before your next appointment, please call your pharmacy.   Lab work: none If you have labs (blood work) drawn today and your tests are completely normal, you will receive your results only by: Marland Kitchen MyChart Message (if you have MyChart) OR . A paper copy in the mail If you have any lab test that is abnormal or we need to change your treatment, we will call you to review the results.  Testing/Procedures: none  Follow-Up: At [facility], you and your health needs are our priority.  As part of our continuing mission to provide you with exceptional heart care, we have created designated Provider Care Teams.  These Care Teams include your  primary Cardiologist (physician) and Advanced Practice Providers (APPs -  Physician Assistants and Nurse Practitioners) who all work together to provide you with the care you need, when you need it. You will need a follow up appointment in 12 months.  Please call our office 2 months in advance to schedule this appointment.  You may see Candee Furbish, MD or one of the following Advanced Practice Providers on your designated Care Team:   Truitt Merle, NP Cecilie Kicks, NP . Kathyrn Drown, NP  Any Other Special Instructions Will Be Listed Below (If Applicable).       Signed, Candee Furbish, MD  05/03/2018 9:20 AM    [facility]

## 2018-05-03 NOTE — Patient Instructions (Signed)
Medication Instructions:  No change If you need a refill on your cardiac medications before your next appointment, please call your pharmacy.   Lab work: none If you have labs (blood work) drawn today and your tests are completely normal, you will receive your results only by: Marland Kitchen MyChart Message (if you have MyChart) OR . A paper copy in the mail If you have any lab test that is abnormal or we need to change your treatment, we will call you to review the results.  Testing/Procedures: none  Follow-Up: At [facility], you and your health needs are our priority.  As part of our continuing mission to provide you with exceptional heart care, we have created designated Provider Care Teams.  These Care Teams include your primary Cardiologist (physician) and Advanced Practice Providers (APPs -  Physician Assistants and Nurse Practitioners) who all work together to provide you with the care you need, when you need it. You will need a follow up appointment in 12 months.  Please call our office 2 months in advance to schedule this appointment.  You may see Candee Furbish, MD or one of the following Advanced Practice Providers on your designated Care Team:   Truitt Merle, NP Cecilie Kicks, NP . Kathyrn Drown, NP  Any Other Special Instructions Will Be Listed Below (If Applicable).

## 2018-05-11 ENCOUNTER — Encounter: Admit: 2018-05-11 | Discharge: 2018-05-12 | Payer: MEDICARE

## 2018-05-11 DIAGNOSIS — L309 Dermatitis, unspecified: Principal | ICD-10-CM

## 2018-05-11 NOTE — Unmapped (Signed)
After obtaining consent, and per orders of Dr. Gabriel Rainwater, injection of Dupixent 300mg /68ml given by Irene Limbo. Patient instructed to remain in clinic for 20 minutes afterwards, and to report any adverse reaction to me immediately.

## 2018-05-18 ENCOUNTER — Telehealth: Payer: Self-pay | Admitting: Internal Medicine

## 2018-05-18 MED ORDER — IPRATROPIUM-ALBUTEROL 0.5-2.5 (3) MG/3ML IN SOLN: 3.0000 mL | Freq: Four times a day (QID) | RESPIRATORY_TRACT | 12 refills | Status: DC | PRN

## 2018-05-18 NOTE — Telephone Encounter (Signed)
Pt is aware Rx has been sent to [place] as requested. Nothing more needed at this time.

## 2018-05-26 ENCOUNTER — Encounter: Admit: 2018-05-26 | Discharge: 2018-05-27 | Payer: MEDICARE

## 2018-05-26 DIAGNOSIS — T887XXA Unspecified adverse effect of drug or medicament, initial encounter: Secondary | ICD-10-CM

## 2018-05-26 DIAGNOSIS — L309 Dermatitis, unspecified: Secondary | ICD-10-CM

## 2018-05-26 DIAGNOSIS — Z79899 Other long term (current) drug therapy: Principal | ICD-10-CM

## 2018-05-26 DIAGNOSIS — L280 Lichen simplex chronicus: Secondary | ICD-10-CM | POA: Diagnosis not present

## 2018-05-26 DIAGNOSIS — L308 Other specified dermatitis: Secondary | ICD-10-CM | POA: Diagnosis not present

## 2018-05-26 NOTE — Unmapped (Signed)
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 She has failed methotrexate due to side effects, and triamcinolone and clobetasol ointment due to lack of efficacy  ?? Failed dupilumab due to new development of hives and tongue feeling funny  ?? Prednisone responsive, but patient prefers to avoid prednisone and only use if severe flare  ?? Discussed other treatment options and agreed to a trial of Imuran  ?? If labs normal, start 50mg  daily    Long term use of high risk medication (Imuran, new start):  ?? TPMT today  ?? Routine monitoring labs (CBC, AST/ALT, BUN/Cr)    RTC 1 month        CC:  Rash    HPI:  Courtney Walker is a friendly 78 y.o. female last seen 03/17/18 for eczematous dermatitis here for follow-up.  At the last visit, the decision was made to start dupilumab.  The hope was that this medication would help her eczematous dermatitis and her asthma.  She had her first injection 04/14/18, second on 04/28/18 and third on 05/11/18.  She was due for her fourth injection on 05/25/18, but elected to skip the injections as she reported have side effects to the nurse.  Injections have been administered in our office as patient has been fearful to self administer.      Since last visit, she reports side effects of the dupilumab.  She reports itchy hives that come in clusters or solitary, tongue feeling funny, eyes watery and worsening vision.  She called the dupilumab pharmacist and he recommended she not take the shot.  Just to clarify, she never really had hives before dupilumab, mainly eczema.  Her eczema improved, but elbows never really cleared.      She has required prednisone tapers in the past for severe flares.  Last prednisone was 5mg  a day for one month, last dose was end of November.  This dose is not really enough to keep the eczema under control.  Pred 10mg  a day works a little better.    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   Neuro: Alert and oriented, and answers questions appropriately.  Skin: Per patient request, focused exam with inspection and palpation of the face, neck, arms, back, legs was performed today. Findings were normal with exception of the following:  - Erythematous edematous plaques on elbows, chest  - Eczematous erythematous plaques on upper arms  - All other areas examined were normal or had no significant findings

## 2018-05-26 NOTE — Unmapped (Signed)
STOP dupilumab!    Labs today or next week.    If okay we will start the new medicine: Imuran (azathioprine)    Biggest side effect is nausea

## 2018-05-27 DIAGNOSIS — J449 Chronic obstructive pulmonary disease, unspecified: Secondary | ICD-10-CM | POA: Diagnosis not present

## 2018-05-29 ENCOUNTER — Encounter: Payer: Self-pay | Admitting: Primary Care

## 2018-05-29 ENCOUNTER — Ambulatory Visit: Payer: Medicare Other | Admitting: Primary Care

## 2018-05-29 DIAGNOSIS — J449 Chronic obstructive pulmonary disease, unspecified: Secondary | ICD-10-CM

## 2018-05-29 MED ORDER — PREDNISONE 10 MG PO TABS: ORAL_TABLET | ORAL | 0 refills | Status: DC

## 2018-05-29 NOTE — Assessment & Plan Note (Signed)
-   Asthma exacerbation d/t viral upper respiratory infection  - Prednisone taper (40mg  x 2 days; 30mg  x 2 days; 20mg  x 2 days; 10mg  x 2 days) - Continue Wixela 1 puff twice a day - Use duoneb nebulizer every 6 hours for shortness of breath or wheezing  - Delsym 5-80ml every 12 hours for cough  - Return or call if not better in 7-10 days or if symptoms worsen

## 2018-05-29 NOTE — Patient Instructions (Signed)
Asthma exacerbation d/t viral upper respiratory infection   Rx: Prednisone taper (40mg  x 2 days; 30mg  x 2 days; 20mg  x 2 days; 10mg  x 2 days)  Recommendations: Continue Wixela 1 puff twice a day Use duoneb nebulizer every 6 hours for shortness of breath or wheezing (try taking for the next 3-5 days) Delsym 5-35ml every 12 hours for cough   Follow-up: Return or call if not better in 7-10 days or if symptoms worsen

## 2018-05-29 NOTE — Progress Notes (Signed)
@Patient  ID: Laura Mendez, female    DOB: 03/17/1941, 78 y.o.   MRN: [id number]  Chief Complaint  Patient presents with  . Acute Visit    SOB with exertion, cough with stringy mucus x3 weeks    Referring provider: Hoyt Koch, *  HPI: 78 year old female, former smoker quit in 1979.  Past medical history significant for asthma with COPD, seasonal allergic rhinitis, breast cancer, proximal aflutter, diastolic dysfunction.  Patient of Dr. Annamaria Boots, last seen 04/17/2018.  Maintained on Dupixent, Wixela 1 puff BID, prn duonebs.  Following with dermatology for urticaria.   Allergy profile in 2018 with total IgE 409, positive for dust mite and cockroach.  Eosinophil count 3300.  Spirometry in 2019 with severe obstructive airway disease. FEV1 37%, ratio 43.  05/29/2018 Patient presents today for acute visit with complaints of cough since Christmas. Associated shortness of breath. Thinks she was exposure to the flu. Continues Wixela twice daily. Using Duonebs every 6 hours for the last three days. States that did loosen it up. Mucus is still stingy. Afebrile.       Allergies  Allergen Reactions  . Clarithromycin Anaphylaxis    "just about killed me"  . Ibuprofen Hives  . Codeine Nausea Only  . Codeine Phosphate Nausea Only  . Diltiazem Rash  . Tape Itching and Other (See Comments)    Tears skin  . Tapentadol Itching    Other reaction(s): Other (See Comments) Tears skin  . Verapamil Hcl Er Rash    Immunization History  Administered Date(s) Administered  . H1N1 04/24/2008  . Influenza Split 02/03/2011, 02/18/2012, 02/09/2013, 03/17/2015  . Influenza Whole 02/22/2007, 02/16/2008, 02/21/2009, 01/23/2010  . Influenza, High Dose Seasonal PF 03/05/2016, 03/09/2017, 02/22/2018  . Influenza,inj,Quad PF,6+ Mos 02/09/2013, 02/22/2014  . Influenza-Unspecified 02/03/2011, 02/09/2013, 02/22/2014  . Pneumococcal Conjugate-13 01/08/2015  . Pneumococcal Polysaccharide-23 05/25/2003,  02/16/2017  . Td 05/24/2004  . Tdap 06/24/2015  . Zoster 08/22/2011    Past Medical History:  Diagnosis Date  . Asthma   . Chronic airway obstruction, not elsewhere classified   . Diastolic dysfunction without heart failure   . Disorder of bone and cartilage, unspecified   . Dysrhythmia    A-Fib  . Family history of colon cancer   . Family history of kidney cancer   . Family history of ovarian cancer   . Paroxysmal atrial flutter ([place])   . Stroke [facility])    TIA  . TIA (transient ischemic attack)   . Unspecified asthma(493.90)     Tobacco History: Social History   Tobacco Use  Smoking Status Former Smoker  . Years: 0.00  . Last attempt to quit: 05/24/1977  . Years since quitting: 41.0  Smokeless Tobacco Never Used   Counseling given: Not Answered   Outpatient Medications Prior to Visit  Medication Sig Dispense Refill  . albuterol (PROVENTIL HFA;VENTOLIN HFA) 108 (90 Base) MCG/ACT inhaler TAKE 2 PUFFS BY MOUTH EVERY 4 HOURS AS NEEDED 18 g 3  . cholecalciferol (VITAMIN D) 1000 units tablet Take 1,000 Units by mouth daily.    . clobetasol ointment (TEMOVATE) 0.86 % Apply 1 application topically 2 (two) times daily as needed (for skin irritation).    . fish oil-omega-3 fatty acids 1000 MG capsule Take 1 g by mouth daily.     . Fluticasone-Salmeterol (WIXELA INHUB) 250-50 MCG/DOSE AEPB Inhale 1 puff into the lungs 2 (two) times daily.    Marland Kitchen ipratropium-albuterol (DUONEB) 0.5-2.5 (3) MG/3ML SOLN Take 3 mLs by nebulization every  6 (six) hours as needed. 75 mL 12  . letrozole (FEMARA) 2.5 MG tablet Take 1 tablet (2.5 mg total) by mouth daily. 90 tablet 3  . MEGARED OMEGA-3 KRILL OIL 500 MG CAPS Take 500 mg by mouth daily.     . Multiple Vitamin (MULTIVITAMIN) tablet Take 1 tablet by mouth daily.     . traMADol (ULTRAM) 50 MG tablet Take 1 tablet (50 mg total) by mouth every 6 (six) hours as needed. 8 tablet 0  . triamcinolone ointment (KENALOG) 0.1 % Apply 1 application topically 2  (two) times daily as needed (for itching).    Alveda Reasons 20 MG TABS tablet TAKE 1 TABLET BY MOUTH EVERY DAY WITH SUPPER (Patient taking differently: TAKE 1 TABLET BY MOUTH EVERY DAY) 30 tablet 6   Facility-Administered Medications Prior to Visit  Medication Dose Route Frequency Provider Last Rate Last Dose  . Mepolizumab SOLR 100 mg  100 mg Subcutaneous Q28 days Baird Lyons D, MD   100 mg at 12/20/17 1455    Review of Systems  Review of Systems  Constitutional: Negative.   HENT: Negative.   Respiratory: Positive for cough, shortness of breath and wheezing.   Cardiovascular: Negative.     Physical Exam  BP 120/70 (BP Location: Left Arm, Cuff Size: Normal)   Pulse 76   Temp 97.8 F (36.6 C)   Ht 5\' 10"  (1.778 m)   Wt 208 lb (94.3 kg)   SpO2 92%   BMI 29.84 kg/m  Physical Exam Constitutional:      Appearance: Normal appearance. She is not ill-appearing.  HENT:     Head: Normocephalic and atraumatic.     Right Ear: Tympanic membrane normal.     Left Ear: Tympanic membrane normal.     Nose: Nose normal.     Mouth/Throat:     Mouth: Mucous membranes are moist.     Pharynx: Oropharynx is clear.  Eyes:     Extraocular Movements: Extraocular movements intact.     Pupils: Pupils are equal, round, and reactive to light.  Neck:     Musculoskeletal: Normal range of motion and neck supple.  Cardiovascular:     Rate and Rhythm: Normal rate and regular rhythm.  Pulmonary:     Effort: Pulmonary effort is normal. No respiratory distress.     Breath sounds: No rhonchi.     Comments: Reactive upper airway cough/wheeze Musculoskeletal: Normal range of motion.  Skin:    General: Skin is warm and dry.  Neurological:     General: No focal deficit present.     Mental Status: She is alert and oriented to person, place, and time. Mental status is at baseline.  Psychiatric:        Mood and Affect: Mood normal.        Behavior: Behavior normal.        Thought Content: Thought content  normal.        Judgment: Judgment normal.      Lab Results:  CBC    Component Value Date/Time   WBC 7.8 12/22/2017 1402   RBC 4.87 12/22/2017 1402   HGB 14.4 12/22/2017 1402   HGB 14.3 11/16/2017 0812   HCT 46.5 (H) 12/22/2017 1402   PLT 260 12/22/2017 1402   PLT 234 11/16/2017 0812   MCV 95.5 12/22/2017 1402   MCH 29.6 12/22/2017 1402   MCHC 31.0 12/22/2017 1402   RDW 13.9 12/22/2017 1402   LYMPHSABS 1.5 11/16/2017 0812   MONOABS 0.8  11/16/2017 0812   EOSABS 0.1 11/16/2017 0812   BASOSABS 0.0 11/16/2017 0812    BMET    Component Value Date/Time   NA 141 12/22/2017 1402   K 3.9 12/22/2017 1402   CL 105 12/22/2017 1402   CO2 25 12/22/2017 1402   GLUCOSE 89 12/22/2017 1402   GLUCOSE 111 (H) 03/04/2006 1034   BUN 10 12/22/2017 1402   CREATININE 0.81 12/22/2017 1402   CREATININE 0.96 11/16/2017 0812   CALCIUM 10.0 12/22/2017 1402   GFRNONAA >60 12/22/2017 1402   GFRNONAA 56 (L) 11/16/2017 0812   GFRAA >60 12/22/2017 1402   GFRAA >60 11/16/2017 0812    BNP No results found for: BNP  ProBNP No results found for: PROBNP  Imaging: No results found.   Assessment & Plan:   Asthma with COPD ([place]) - Asthma exacerbation d/t viral upper respiratory infection  - Prednisone taper (40mg  x 2 days; 30mg  x 2 days; 20mg  x 2 days; 10mg  x 2 days) - Continue Wixela 1 puff twice a day - Use duoneb nebulizer every 6 hours for shortness of breath or wheezing  - Delsym 5-95ml every 12 hours for cough  - Return or call if not better in 7-10 days or if symptoms worsen    Martyn Ehrich, NP 05/29/2018

## 2018-05-31 MED ORDER — PREDNISONE 5 MG TABLET
ORAL_TABLET | 1 refills | 0 days | Status: CP
Start: 2018-05-31 — End: 2018-06-26

## 2018-06-01 LAB — CBC W/ DIFFERENTIAL
BANDED NEUTROPHILS ABSOLUTE COUNT: 0 10*3/uL (ref 0.0–0.1)
BASOPHILS RELATIVE PERCENT: 1 %
EOSINOPHILS ABSOLUTE COUNT: 0.1 10*3/uL (ref 0.0–0.4)
EOSINOPHILS RELATIVE PERCENT: 1 %
HEMATOCRIT: 42.4 % (ref 34.0–46.6)
IMMATURE GRANULOCYTES: 0 %
LYMPHOCYTES ABSOLUTE COUNT: 1.5 10*3/uL (ref 0.7–3.1)
LYMPHOCYTES RELATIVE PERCENT: 17 %
MEAN CORPUSCULAR HEMOGLOBIN CONC: 34.4 g/dL (ref 31.5–35.7)
MEAN CORPUSCULAR VOLUME: 89 fL (ref 79–97)
MONOCYTES ABSOLUTE COUNT: 0.4 10*3/uL (ref 0.1–0.9)
MONOCYTES RELATIVE PERCENT: 5 %
NEUTROPHILS ABSOLUTE COUNT: 6.4 10*3/uL (ref 1.4–7.0)
NEUTROPHILS RELATIVE PERCENT: 76 %
PLATELET COUNT: 324 10*3/uL (ref 150–450)
RED BLOOD CELL COUNT: 4.75 x10E6/uL (ref 3.77–5.28)
RED CELL DISTRIBUTION WIDTH: 12.9 % (ref 12.3–15.4)
WHITE BLOOD CELL COUNT: 8.5 10*3/uL (ref 3.4–10.8)

## 2018-06-01 LAB — AST (SGOT): Lab: 28

## 2018-06-01 LAB — CREATININE
CREATININE: 0.95 mg/dL (ref 0.57–1.00)
GFR MDRD NON AF AMER: 58 mL/min/{1.73_m2} — ABNORMAL LOW

## 2018-06-01 LAB — THIOPURINE MTRAN: Lab: 32.4

## 2018-06-01 LAB — MEAN CORPUSCULAR HEMOGLOBIN: Lab: 30.7

## 2018-06-01 LAB — ALT (SGPT): Lab: 23

## 2018-06-01 LAB — BLOOD UREA NITROGEN: Lab: 12

## 2018-06-01 LAB — GFR MDRD AF AMER: Lab: 67

## 2018-06-01 LAB — THIOPURINE METHYLTRANFERASE: THIOPURINE MTRAN: 32.4 U/mL{RBCs}

## 2018-06-01 MED ORDER — AZATHIOPRINE 50 MG TABLET
ORAL_TABLET | Freq: Every day | ORAL | 0 refills | 0.00000 days | Status: CP
Start: 2018-06-01 — End: 2018-06-26

## 2018-06-01 NOTE — Unmapped (Signed)
Patient notified via Mychart of normal labs.  Okay to start Imuran as discussed at last visit.

## 2018-06-01 NOTE — Unmapped (Signed)
Addended by: Inis Sizer A on: 06/01/2018 08:38 AM     Modules accepted: Orders

## 2018-06-01 NOTE — Unmapped (Signed)
Addended by: Inis Sizer A on: 06/01/2018 08:39 AM     Modules accepted: Orders

## 2018-06-04 NOTE — Unmapped (Signed)
Result note unread.  Called patient by phone to discuss.  She understands the plan.

## 2018-06-17 ENCOUNTER — Other Ambulatory Visit: Payer: Self-pay | Admitting: Cardiology

## 2018-06-19 NOTE — Telephone Encounter (Signed)
Pt last saw Dr Marlou Porch 05/03/18, last labs 12/22/17 Creat 0.81, age 78, weight 94.3kg, CrCl 86.59, based on CrCl pt is on appropriate dosage of Xarelto 20mg  QD.  Will refill rx.

## 2018-06-26 ENCOUNTER — Encounter: Admit: 2018-06-26 | Discharge: 2018-06-27 | Payer: MEDICARE

## 2018-06-26 DIAGNOSIS — L209 Atopic dermatitis, unspecified: Principal | ICD-10-CM

## 2018-06-26 DIAGNOSIS — Z79899 Other long term (current) drug therapy: Secondary | ICD-10-CM

## 2018-06-26 DIAGNOSIS — L309 Dermatitis, unspecified: Secondary | ICD-10-CM | POA: Diagnosis not present

## 2018-06-26 NOTE — Unmapped (Addendum)
Use triamcinolone ointment twice daily to the areas of rash as needed until clear then stop.   We talked about light therapy.

## 2018-06-26 NOTE — Unmapped (Signed)
Dermatology Follow-up Note    A/P:    Eczematous dermatitis, well controlled today:  - Unfortunately unable to tolerate Imuran 50 mg daily due to side effects of GI upset, fatigue and sensitive skin, will stop this for now   - Has also failed MTX due to side effects and  dupilumab due to hives  - Given good control today, will continue on topicals only. Advised to use triamcinolone ointment BID to the areas of rash PRN until clear then stop. She knows to use under occlusion for persistent areas.  - Discussed trial of light therapy if flares off of all systemics in the future given side effects on multiple oral therapies. Discussed risk of sunburn and skin cancer with light therapy. Will hold off for now per patient request.      Long term use of high risk medication (Imuran):  ?? Routine monitoring labs (CBC, AST/ALT, BUN/Cr) checked today.    Return in about 4 weeks (around 07/24/2018) for Recheck.        CC:  Rash    HPI:  Courtney Walker is a 78 y.o. female last seen 05/26/2018 by Dr. Orma Flaming for eczematous rash who presents today for follow up of the same.     At last visit, she was started on Imuran 50 mg daily. She previously had been on dupilumab, however she had side effects of hives so this was stopped.     Since LV, reports that her rash has been under good control. Only has a few areas of rash on the bl arms. Not using topicals currently, however these are helpful in controlling her symptoms when she uses them. She stopped the prednisone last week. She unfortuantely did not tolerate Imuran well due to side effects of fatigue, GI upset and skin sensitivity. She does not want to continue on this.     She does report that she is sensitive to the sun and gets sun burned easily.     She has required prednisone tapers in the past for severe flares.      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 No fevers, chills, headaches, joint pains or other skin concerns.  +GI upset, fatigue    PE:  General: Well-developed, well-nourished female, in no acute distress.   Neuro: Alert and oriented, and answers questions appropriately.  Skin: Per patient request, focused exam with inspection and palpation of the face, neck, arms, back, legs, abdomen was performed today. Findings were normal with exception of the following:  - Few thin eczematous plaques on bl arms  - Trunk and legs clear   - All other areas examined were normal or had no significant findings.      The patient was seen and examined by Dr. Orma Flaming who agrees with the assessment and plan  as above.

## 2018-06-27 DIAGNOSIS — J449 Chronic obstructive pulmonary disease, unspecified: Secondary | ICD-10-CM | POA: Diagnosis not present

## 2018-06-27 LAB — BLOOD UREA NITROGEN: Lab: 11

## 2018-06-27 LAB — AST (SGOT): Lab: 44 — ABNORMAL HIGH

## 2018-06-27 LAB — CBC W/ DIFFERENTIAL
BANDED NEUTROPHILS ABSOLUTE COUNT: 0 10*3/uL (ref 0.0–0.1)
BASOPHILS ABSOLUTE COUNT: 0 10*3/uL (ref 0.0–0.2)
BASOPHILS RELATIVE PERCENT: 1 %
EOSINOPHILS RELATIVE PERCENT: 5 %
HEMATOCRIT: 43.9 % (ref 34.0–46.6)
HEMOGLOBIN: 14.3 g/dL (ref 11.1–15.9)
IMMATURE GRANULOCYTES: 0 %
LYMPHOCYTES ABSOLUTE COUNT: 1.3 10*3/uL (ref 0.7–3.1)
LYMPHOCYTES RELATIVE PERCENT: 24 %
MEAN CORPUSCULAR HEMOGLOBIN: 30.1 pg (ref 26.6–33.0)
MEAN CORPUSCULAR VOLUME: 92 fL (ref 79–97)
MONOCYTES ABSOLUTE COUNT: 0.4 10*3/uL (ref 0.1–0.9)
MONOCYTES RELATIVE PERCENT: 8 %
NEUTROPHILS ABSOLUTE COUNT: 3.2 10*3/uL (ref 1.4–7.0)
NEUTROPHILS RELATIVE PERCENT: 62 %
PLATELET COUNT: 312 10*3/uL (ref 150–450)
RED BLOOD CELL COUNT: 4.75 x10E6/uL (ref 3.77–5.28)
RED CELL DISTRIBUTION WIDTH: 13.1 % (ref 11.7–15.4)
WHITE BLOOD CELL COUNT: 5.1 10*3/uL (ref 3.4–10.8)

## 2018-06-27 LAB — ALT (SGPT): Lab: 38 — ABNORMAL HIGH

## 2018-06-27 LAB — CREATININE
CREATININE: 0.93 mg/dL (ref 0.57–1.00)
GFR MDRD AF AMER: 69 mL/min/{1.73_m2}

## 2018-06-27 LAB — BANDED NEUTROPHILS ABSOLUTE COUNT: Lab: 0

## 2018-06-27 LAB — GFR MDRD AF AMER: Lab: 69

## 2018-06-27 NOTE — Unmapped (Signed)
Hi Courtney Walker,  Just a quick note to let you know I got your labs.  Your liver tests were the tiniest bit above the normal limit, but nothing dangerous.  That may well have been due to the medication.  Since you are no longer taking it, I think it is fine!    Let me know if you have any questions!  Dr. Orma Flaming

## 2018-07-03 ENCOUNTER — Telehealth: Payer: Self-pay | Admitting: Internal Medicine

## 2018-07-04 ENCOUNTER — Telehealth: Payer: Self-pay | Admitting: Hematology and Oncology

## 2018-07-04 NOTE — Telephone Encounter (Signed)
[facility] CME 2/28. Moved f/u to 3/3. Left message schedule mailed.

## 2018-07-11 NOTE — Telephone Encounter (Signed)
Spoke with Kathlee Nations. She is investigating this. Will leave message open per her request.

## 2018-07-21 ENCOUNTER — Ambulatory Visit: Payer: Medicare Other | Admitting: Hematology and Oncology

## 2018-07-25 ENCOUNTER — Ambulatory Visit: Payer: Medicare Other | Admitting: Hematology and Oncology

## 2018-07-25 NOTE — Progress Notes (Signed)
Patient Care Team: Hoyt Koch, MD as PCP - General (Internal Medicine) Jerline Pain, MD as PCP - Cardiology (Cardiology) Rolm Bookbinder, MD as Consulting Physician (General Surgery) Nicholas Lose, MD as Consulting Physician (Hematology and Oncology) Gery Pray, MD as Consulting Physician (Radiation Oncology)  DIAGNOSIS:    ICD-10-CM   1. Malignant neoplasm of lower-outer quadrant of right breast of female, estrogen receptor positive ([place]) C50.511 MM DIAG BREAST TOMO BILATERAL   Z17.0     SUMMARY OF ONCOLOGIC HISTORY:   Malignant neoplasm of lower-outer quadrant of right breast of female, estrogen receptor positive ([place])   11/10/2017 Initial Diagnosis    Six-month follow-up of right breast masses: Anterior mass unchanged and posterior mass showed spiculation.  Biopsy attempt was made on posterior mass but accidentally the anterior mass was actually biopsied.  It revealed grade 1 invasive ductal carcinoma ER 100%, PR 100%, Ki-67 1%, HER-2 negative ratio 1.77; second attempt to biopsy the posterior mass resulted in the biopsy of an intermediate zone between the 2 masses that revealed ADH and ALH, (posterior mass still needs to be biopsied) T1 a N0 stage I a clinical stage    12/30/2017 Surgery    Right lumpectomy: IDC grade 1, 0.6 cm, ADH, ER 100%, PR 100%, HER-2 negative ratio 1.77, Ki-67 1%, margins negative.  Lymph nodes not evaluated.  T1b N0 stage Ia    12/30/2017 Surgery    Right lumpectomy: IDC with calcifications grade 1, 0.6 cm, ADH, margins negative, ER 100%, PR 90%, HER-2 negative ratio 1.77, Ki-67 1%, T1 be N0 stage Ia     01/05/2018 Genetic Testing    Negative genetic testing on the multicancer panel.  The Multi-Gene Panel offered by [facility] includes sequencing and/or deletion duplication testing of the following 84 genes: AIP, ALK, APC, ATM, AXIN2,BAP1,  BARD1, BLM, BMPR1A, BRCA1, BRCA2, BRIP1, CASR, CDC73, CDH1, CDK4, CDKN1B, CDKN1C, CDKN2A (p14ARF), CDKN2A  (p16INK4a), CEBPA, CHEK2, CTNNA1, DICER1, DIS3L2, EGFR (c.2369C>T, p.Thr790Met variant only), EPCAM (Deletion/duplication testing only), FH, FLCN, GATA2, GPC3, GREM1 (Promoter region deletion/duplication testing only), HOXB13 (c.251G>A, p.Gly84Glu), HRAS, KIT, MAX, MEN1, MET, MITF (c.952G>A, p.Glu318Lys variant only), MLH1, MSH2, MSH3, MSH6, MUTYH, NBN, NF1, NF2, NTHL1, PALB2, PDGFRA, PHOX2B, PMS2, POLD1, POLE, POT1, PRKAR1A, PTCH1, PTEN, RAD50, RAD51C, RAD51D, RB1, RECQL4, RET, RUNX1, SDHAF2, SDHA (sequence changes only), SDHB, SDHC, SDHD, SMAD4, SMARCA4, SMARCB1, SMARCE1, STK11, SUFU, TERC, TERT, TMEM127, TP53, TSC1, TSC2, VHL, WRN and WT1.  The report date is January 05, 2018.    01/11/2018 Cancer Staging    Staging form: Breast, AJCC 8th Edition - Pathologic: Stage IA (pT1b, pN0, cM0, G1, ER+, PR+, HER2-) - Signed by Gardenia Phlegm, NP on 01/11/2018    01/12/2018 -  Anti-estrogen oral therapy    Letrozole 2.5 mg daily     CHIEF COMPLIANT: Follow-up of letrozole therapy  INTERVAL HISTORY: Laura Mendez is a 78 y.o. with above-mentioned history of right breast cancer who underwent a lumpectomy and is currently on anti-estrogen therapy with letrozole. She presents to the clinic alone today and notes hair loss for which she has started taking biotin with moderate improvement. She denies hot flashes or myalgias. She reports occasional sharp pain in her right breast for which she rubs Vitamin E on the skin. She drinks a lot of water and exercises 3 times per week.   REVIEW OF SYSTEMS:   Constitutional: Denies fevers, chills or abnormal weight loss (+) hair loss Eyes: Denies blurriness of vision Ears, nose, mouth, throat, and  face: Denies mucositis or sore throat Respiratory: Denies cough, dyspnea or wheezes Cardiovascular: Denies palpitation, chest discomfort Gastrointestinal: Denies nausea, heartburn or change in bowel habits Skin: Denies abnormal skin rashes Lymphatics: Denies new  lymphadenopathy or easy bruising Neurological: Denies numbness, tingling or new weaknesses Behavioral/Psych: Mood is stable, no new changes  Extremities: No lower extremity edema Breast: denies any lumps or nodules in either breasts (+) occasional, sharp pain in right breast All other systems were reviewed with the patient and are negative.  I have reviewed the past medical history, past surgical history, social history and family history with the patient and they are unchanged from previous note.  ALLERGIES:  is allergic to clarithromycin; ibuprofen; codeine; codeine phosphate; diltiazem; tape; tapentadol; and verapamil hcl er.  MEDICATIONS:  Current Outpatient Medications  Medication Sig Dispense Refill  . albuterol (PROVENTIL HFA;VENTOLIN HFA) 108 (90 Base) MCG/ACT inhaler TAKE 2 PUFFS BY MOUTH EVERY 4 HOURS AS NEEDED 18 g 3  . cholecalciferol (VITAMIN D) 1000 units tablet Take 1,000 Units by mouth daily.    . clobetasol ointment (TEMOVATE) 5.27 % Apply 1 application topically 2 (two) times daily as needed (for skin irritation).    . fish oil-omega-3 fatty acids 1000 MG capsule Take 1 g by mouth daily.     . Fluticasone-Salmeterol (WIXELA INHUB) 250-50 MCG/DOSE AEPB Inhale 1 puff into the lungs 2 (two) times daily.    Marland Kitchen ipratropium-albuterol (DUONEB) 0.5-2.5 (3) MG/3ML SOLN Take 3 mLs by nebulization every 6 (six) hours as needed. 75 mL 12  . letrozole (FEMARA) 2.5 MG tablet Take 1 tablet (2.5 mg total) by mouth daily. 90 tablet 3  . MEGARED OMEGA-3 KRILL OIL 500 MG CAPS Take 500 mg by mouth daily.     . Multiple Vitamin (MULTIVITAMIN) tablet Take 1 tablet by mouth daily.     . predniSONE (DELTASONE) 10 MG tablet Take 4 tabs po daily x 2 days; then 3 tabs for 2 days; then 2 tabs for 2 days; then 1 tab for 2 days 20 tablet 0  . traMADol (ULTRAM) 50 MG tablet Take 1 tablet (50 mg total) by mouth every 6 (six) hours as needed. 8 tablet 0  . triamcinolone ointment (KENALOG) 0.1 % Apply 1  application topically 2 (two) times daily as needed (for itching).    Alveda Reasons 20 MG TABS tablet TAKE 1 TABLET BY MOUTH EVERY DAY WITH SUPPER 90 tablet 2   Current Facility-Administered Medications  Medication Dose Route Frequency Provider Last Rate Last Dose  . Mepolizumab SOLR 100 mg  100 mg Subcutaneous Q28 days Baird Lyons D, MD   100 mg at 12/20/17 1455    PHYSICAL EXAMINATION: ECOG PERFORMANCE STATUS: 1 - Symptomatic but completely ambulatory  Vitals:   07/26/18 1513  BP: (!) 152/74  Pulse: 62  Resp: 17  Temp: 98.6 F (37 C)  SpO2: 96%   Filed Weights   07/26/18 1513  Weight: 208 lb 14.4 oz (94.8 kg)    GENERAL: alert, no distress and comfortable SKIN: skin color, texture, turgor are normal, no rashes or significant lesions EYES: normal, Conjunctiva are pink and non-injected, sclera clear OROPHARYNX: no exudate, no erythema and lips, buccal mucosa, and tongue normal  NECK: supple, thyroid normal size, non-tender, without nodularity LYMPH: no palpable lymphadenopathy in the cervical, axillary or inguinal LUNGS: clear to auscultation and percussion with normal breathing effort HEART: regular rate & rhythm and no murmurs and no lower extremity edema ABDOMEN: abdomen soft, non-tender and normal bowel  sounds MUSCULOSKELETAL: no cyanosis of digits and no clubbing  NEURO: alert & oriented x 3 with fluent speech, no focal motor/sensory deficits EXTREMITIES: No lower extremity edema BREAST: No palpable masses or nodules in either right or left breasts. No palpable axillary supraclavicular or infraclavicular adenopathy no breast tenderness or nipple discharge. (exam performed in the presence of a chaperone)  LABORATORY DATA:  I have reviewed the data as listed CMP Latest Ref Rng & Units 12/22/2017 11/16/2017 02/16/2017  Glucose 70 - 99 mg/dL 89 90 103(H)  BUN 8 - 23 mg/dL 10 14 16   Creatinine 0.44 - 1.00 mg/dL 0.81 0.96 1.02  Sodium 135 - 145 mmol/L 141 141 143  Potassium  3.5 - 5.1 mmol/L 3.9 4.0 4.3  Chloride 98 - 111 mmol/L 105 104 104  CO2 22 - 32 mmol/L 25 27 30   Calcium 8.9 - 10.3 mg/dL 10.0 10.2 10.2  Total Protein 6.5 - 8.1 g/dL - 7.7 7.5  Total Bilirubin 0.3 - 1.2 mg/dL - 0.5 0.6  Alkaline Phos 38 - 126 U/L - 78 57  AST 15 - 41 U/L - 27 27  ALT 0 - 44 U/L - 23 24    Lab Results  Component Value Date   WBC 7.8 12/22/2017   HGB 14.4 12/22/2017   HCT 46.5 (H) 12/22/2017   MCV 95.5 12/22/2017   PLT 260 12/22/2017   NEUTROABS 5.5 11/16/2017    ASSESSMENT & PLAN:  Malignant neoplasm of lower-outer quadrant of right breast of female, estrogen receptor positive ([place]) 12/30/2017:Right lumpectomy: IDC with calcifications grade 1, 0.6 cm, ADH, margins negative, ER 100%, PR 90%, HER-2 negative ratio 1.77, Ki-67 1%, T1 be N0 stage Ia Did not need adjuvant radiation because of favorable tumor characteristics and age over 3  Current treatment: Letrozole 2.5 mg daily started August 2019 Letrozole toxicities: Denies any adverse effects letrozole.  Breast cancer surveillance: 1.  Breast exam 07/26/2018: Benign 2.  Mammogram: Will need to be scheduled for June 2020  She does work during the voting season.  She is excited about the [employer] coming up.  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 MM DIAG BREAST TOMO BILATERAL    Standing Status:   Future    Standing Expiration Date:   07/26/2019    Order Specific Question:   Reason for Exam (SYMPTOM  OR DIAGNOSIS REQUIRED)    Answer:   annual mammograms with breast cancer history    Order Specific Question:   Preferred imaging location?    Answer:   [facility]   The patient has a good understanding of the overall plan. she agrees with it. she will call with any problems that may develop before the next visit here.  Nicholas Lose, MD 07/26/2018  Julious Oka Dorshimer am acting as scribe for Dr. Nicholas Lose.  I  have reviewed the above documentation for accuracy and completeness, and I agree with the above.

## 2018-07-26 ENCOUNTER — Telehealth: Payer: Self-pay | Admitting: Hematology and Oncology

## 2018-07-26 ENCOUNTER — Inpatient Hospital Stay: Payer: Medicare Other | Attending: Hematology and Oncology | Admitting: Hematology and Oncology

## 2018-07-26 DIAGNOSIS — C50511 Malignant neoplasm of lower-outer quadrant of right female breast: Secondary | ICD-10-CM

## 2018-07-26 DIAGNOSIS — Z170 Estrogen receptor positive status [ER+]: Secondary | ICD-10-CM | POA: Diagnosis not present

## 2018-07-26 DIAGNOSIS — Z79899 Other long term (current) drug therapy: Secondary | ICD-10-CM

## 2018-07-26 DIAGNOSIS — Z7901 Long term (current) use of anticoagulants: Secondary | ICD-10-CM | POA: Diagnosis not present

## 2018-07-26 DIAGNOSIS — Z79811 Long term (current) use of aromatase inhibitors: Secondary | ICD-10-CM | POA: Diagnosis not present

## 2018-07-26 DIAGNOSIS — Z7952 Long term (current) use of systemic steroids: Secondary | ICD-10-CM | POA: Insufficient documentation

## 2018-07-26 DIAGNOSIS — J449 Chronic obstructive pulmonary disease, unspecified: Secondary | ICD-10-CM | POA: Diagnosis not present

## 2018-07-26 MED ORDER — LETROZOLE 2.5 MG PO TABS: 2.5000 mg | ORAL_TABLET | Freq: Every day | ORAL | 3 refills | Status: DC

## 2018-07-26 NOTE — Telephone Encounter (Signed)
Gave avs and calendar ° °

## 2018-07-26 NOTE — Assessment & Plan Note (Signed)
12/30/2017:Right lumpectomy: IDC with calcifications grade 1, 0.6 cm, ADH, margins negative, ER 100%, PR 90%, HER-2 negative ratio 1.77, Ki-67 1%, T1 be N0 stage Ia Did not need adjuvant radiation because of favorable tumor characteristics and age over 48  Current treatment: Letrozole 2.5 mg daily started August 2019 Letrozole toxicities: Denies any adverse effects letrozole.  Breast cancer surveillance: 1.  Breast exam 07/26/2018: Benign 2.  Mammogram: Will need to be scheduled for May 2020  Return to clinic in 1 year for follow-up

## 2018-07-27 NOTE — Telephone Encounter (Signed)
Laura Mendez please provide update thank you.

## 2018-08-03 NOTE — Telephone Encounter (Signed)
Attempted to call pt but unable to reach. Left message for pt to return call. 

## 2018-08-03 NOTE — Telephone Encounter (Signed)
Pt is calling back [phone]

## 2018-08-03 NOTE — Telephone Encounter (Signed)
Kathlee Nations will need to handle this matter.

## 2018-08-04 ENCOUNTER — Encounter: Admit: 2018-08-04 | Discharge: 2018-08-05 | Payer: MEDICARE

## 2018-08-04 DIAGNOSIS — L309 Dermatitis, unspecified: Principal | ICD-10-CM

## 2018-08-04 DIAGNOSIS — J45909 Unspecified asthma, uncomplicated: Principal | ICD-10-CM

## 2018-08-04 NOTE — Unmapped (Signed)
You have already tried:  Methotrexate (side effects of tiredness among others)  Dupilumab (gave hives)  Imuran (side effects of tiredness)  Prednisone (works, but not a good long term option)    The next option would be:  Cellcept/mycophenolate - this is another medication used to treat dermatitis when it is severe!    For now continue on the topical medications:  Triamcinolone (tub) - for most places  Clobetasol (tube) - for worst areas

## 2018-08-04 NOTE — Unmapped (Signed)
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 Routine monitoring labs (CBC, AST/ALT, BUN/Cr) checked today.    RTC 2 months        CC:  Rash    HPI:  Courtney Walker is a 78 y.o. female last seen 05/26/2018 by Dr. Orma Flaming for eczematous rash who presents today for follow up of the same.     At the last visit, she was unable to tolerate Imuran 50mg  daily due to side effects of GI upset, fatigue and sensitive skin. She was fairly well controlled at that time (had just completed a prednisone course) and we decided to just continue topical steroids.    Today, she reports that the rash has started coming back over the last few weeks all over her body with associated itching and redness.  She broke out around her neck last week pretty bad and used the triamcinolone which helped.  She has recurrent areas on her elbows and arms that have not responded as well.  She also feels her asthma has been acting up lately.  She was fairly well controlled on xolair previously, but this was stopped when they thought the skin rash might be an allergy to the xolair.    She does report that she is sensitive to the sun and gets sun burned easily.     She has required prednisone tapers in the past for severe flares.      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 No fevers, chills, headaches, joint pains or other skin concerns.     PE:  General: Well-developed, well-nourished female, in no acute distress.   Neuro: Alert and oriented, and answers questions appropriately.  Skin: Per patient request, focused exam with inspection and palpation of the face, neck, arms, legs was performed today. Findings were normal with exception of the following:  - Few thin eczematous plaques on the bilateral ankles  - ERythematous, eczemaotus plaques fairly thick on the elbows and forearms  - Diffuse mottled erythema over the chest  - Dorsal hands with mild erythema  - All other areas examined were normal or had no significant findings.

## 2018-08-14 NOTE — Telephone Encounter (Signed)
Routing message to Kathlee Nations for update. Pt would like update at phone [phone]  Kathlee Nations please advise, thank you.

## 2018-08-18 ENCOUNTER — Ambulatory Visit: Payer: Medicare Other | Admitting: Internal Medicine

## 2018-08-22 NOTE — Telephone Encounter (Signed)
Spoke with Kathlee Nations in her office today regarding pt's concerns. Kathlee Nations stated that she will call the patient today. She is aware of this message. Awaiting response from [place].

## 2018-08-26 DIAGNOSIS — J449 Chronic obstructive pulmonary disease, unspecified: Secondary | ICD-10-CM | POA: Diagnosis not present

## 2018-08-28 NOTE — Telephone Encounter (Signed)
Communicated to [place] by skype to see if she is was able to contact patient last week.  Kathlee Nations please advise if patient has been contacted. Thank you.

## 2018-08-30 NOTE — Telephone Encounter (Signed)
Per Kathlee Nations this has been taken care of and encounter can be closed Will sign off

## 2018-09-25 DIAGNOSIS — J449 Chronic obstructive pulmonary disease, unspecified: Secondary | ICD-10-CM | POA: Diagnosis not present

## 2018-10-04 ENCOUNTER — Telehealth: Payer: Self-pay | Admitting: Internal Medicine

## 2018-10-05 ENCOUNTER — Telehealth: Payer: Self-pay | Admitting: Internal Medicine

## 2018-10-05 NOTE — Telephone Encounter (Signed)
Patient has called requesting a call from Kathlee Nations re guarding [employer] billing.  Per message [employer] account for Xolair has billing issue. Left Patient a message on machine to let her know we received her message and to let her know someone will be following up on her concerns.  Message routed to Kathlee Nations to follow up

## 2018-10-09 NOTE — Telephone Encounter (Signed)
Laura Mendez, please update on the below concerns of pt's Xolair billing questions. Thank you.

## 2018-10-09 NOTE — Telephone Encounter (Signed)
Pt is requesting to speak with Kathlee Nations regarding a bill rec'd for Xolair injections. Kathlee Nations please advise. Thank you.

## 2018-10-18 NOTE — Telephone Encounter (Signed)
Kathlee Nations please advise if we are able to close this encounter.  Thanks

## 2018-10-23 NOTE — Telephone Encounter (Signed)
Laura Mendez, please provide update for patient's Xolair injection concerns on billing. Thank you

## 2018-10-26 DIAGNOSIS — J449 Chronic obstructive pulmonary disease, unspecified: Secondary | ICD-10-CM | POA: Diagnosis not present

## 2018-11-03 NOTE — Telephone Encounter (Signed)
Please below. Can you provide an update on the billing information below? Thank you much.

## 2018-11-07 NOTE — Telephone Encounter (Signed)
Attempted to call pt to follow up on this to see if she had been contacted by Kathlee Nations but unable to reach pt. Left message for pt to return call.

## 2018-11-10 NOTE — Telephone Encounter (Signed)
Spoke with pt, she states he had not heard back from [place] yet about his account. I will email her since she does not work in [employer] with billing and may not see this message.

## 2018-11-14 NOTE — Telephone Encounter (Signed)
No I havent received a response from Kathlee Nations about this pt in my email.

## 2018-11-14 NOTE — Telephone Encounter (Signed)
Jonelle Sidle, please advise if you received a response from Kathlee Nations in regards to the email you sent her. Thanks!

## 2018-11-15 NOTE — Telephone Encounter (Signed)
Waiting for Kathlee Nations to respond.

## 2018-11-20 NOTE — Telephone Encounter (Signed)
Called and spoke with pt to see if she had received a call from Kathlee Nations in regards to her Xolair bill and pt stated she had not. Pt said that she had PAN that takes care of her Xolair injections but stated there was one month that was missed with the PAN. Pt stated she has a follow up appt with CY 7/7 and said if this had not been fixed by that visit, she would bring the bill to office with her for [place] to give to Kathlee Nations at that time. Nothing further needed.

## 2018-11-21 DIAGNOSIS — H35052 Retinal neovascularization, unspecified, left eye: Secondary | ICD-10-CM | POA: Diagnosis not present

## 2018-11-21 DIAGNOSIS — Z961 Presence of intraocular lens: Secondary | ICD-10-CM | POA: Diagnosis not present

## 2018-11-21 DIAGNOSIS — H26491 Other secondary cataract, right eye: Secondary | ICD-10-CM | POA: Diagnosis not present

## 2018-11-22 ENCOUNTER — Ambulatory Visit: Payer: Medicare Other | Admitting: Internal Medicine

## 2018-11-23 ENCOUNTER — Ambulatory Visit
Admission: RE | Admit: 2018-11-23 | Discharge: 2018-11-23 | Disposition: A | Discharge status: Home / Self Care | Payer: Medicare Other | Source: Ambulatory Visit | Attending: Hematology and Oncology | Admitting: Hematology and Oncology

## 2018-11-23 ENCOUNTER — Other Ambulatory Visit: Payer: Self-pay

## 2018-11-23 DIAGNOSIS — C50511 Malignant neoplasm of lower-outer quadrant of right female breast: Secondary | ICD-10-CM

## 2018-11-23 DIAGNOSIS — R928 Other abnormal and inconclusive findings on diagnostic imaging of breast: Secondary | ICD-10-CM | POA: Diagnosis not present

## 2018-11-23 IMAGING — MG DIGITAL DIAGNOSTIC BILATERAL MAMMOGRAM WITH TOMO AND CAD
6 of 9 series · 6 of 25 positions shown · non-contrast
Comparison: [DATE] and earlier

CLINICAL DATA: Status post RIGHT lumpectomy in [DATE].

EXAM:
DIGITAL DIAGNOSTIC BILATERAL MAMMOGRAM WITH CAD AND TOMO

[R CC]
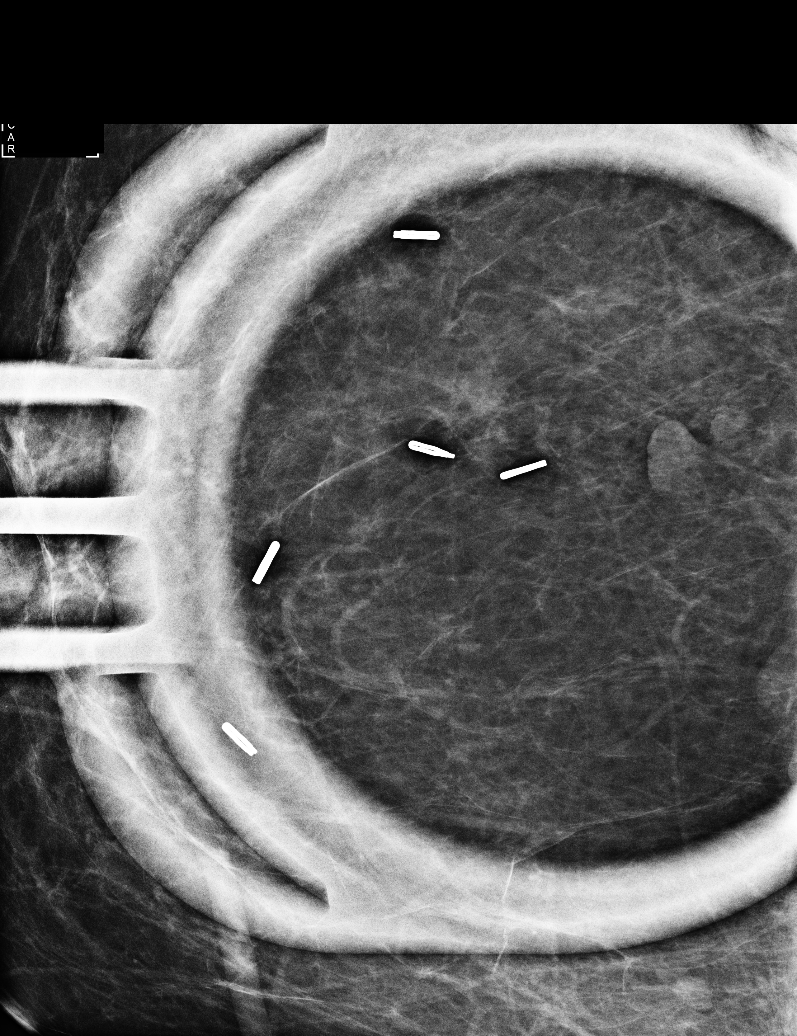

[L CC synth-2D]
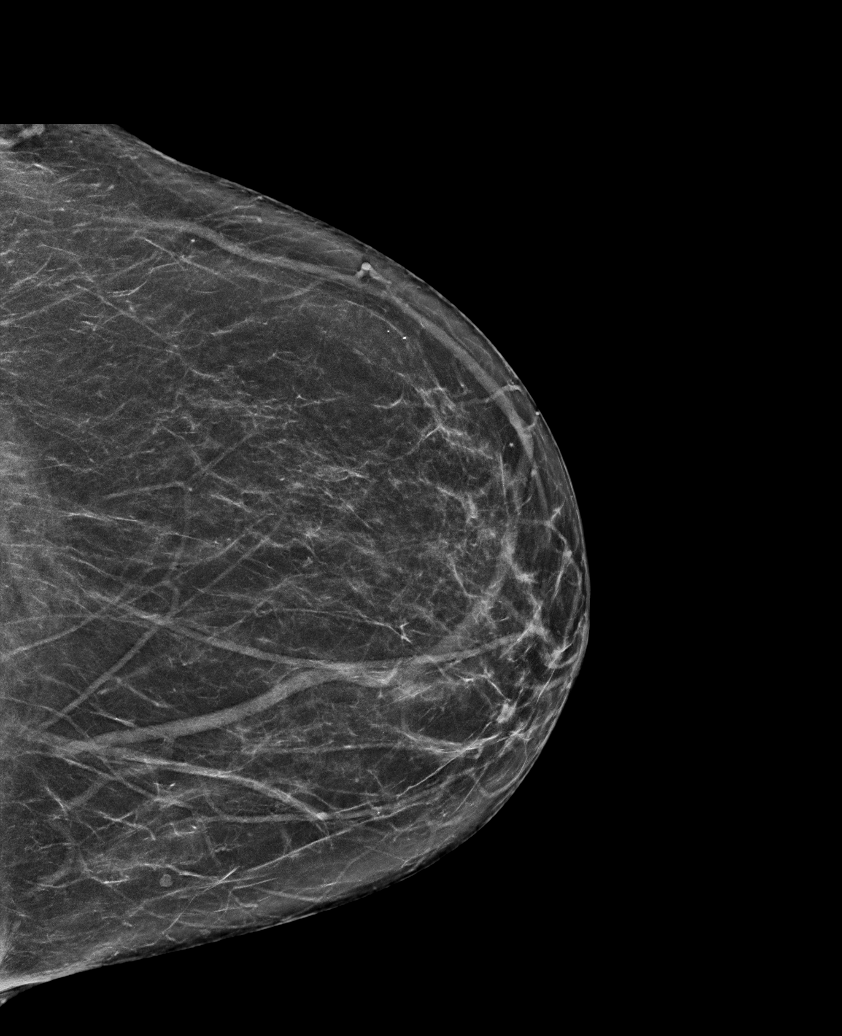

[R MLO synth-2D]
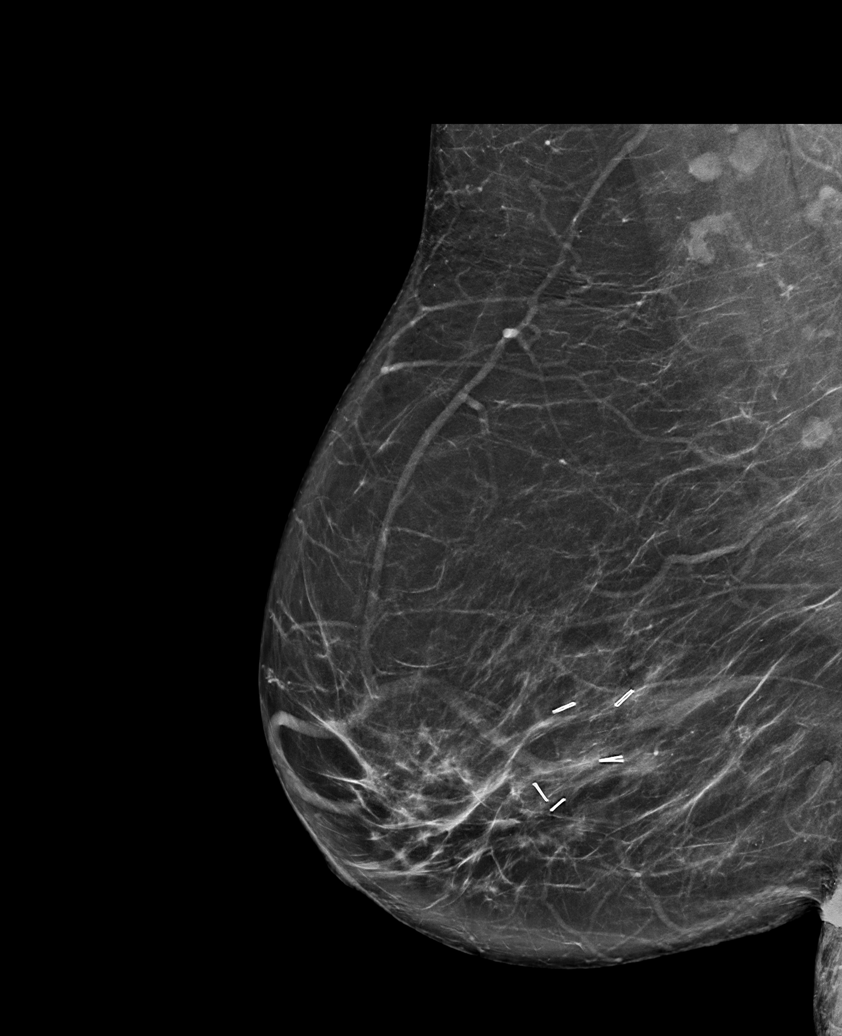

[L MLO synth-2D]
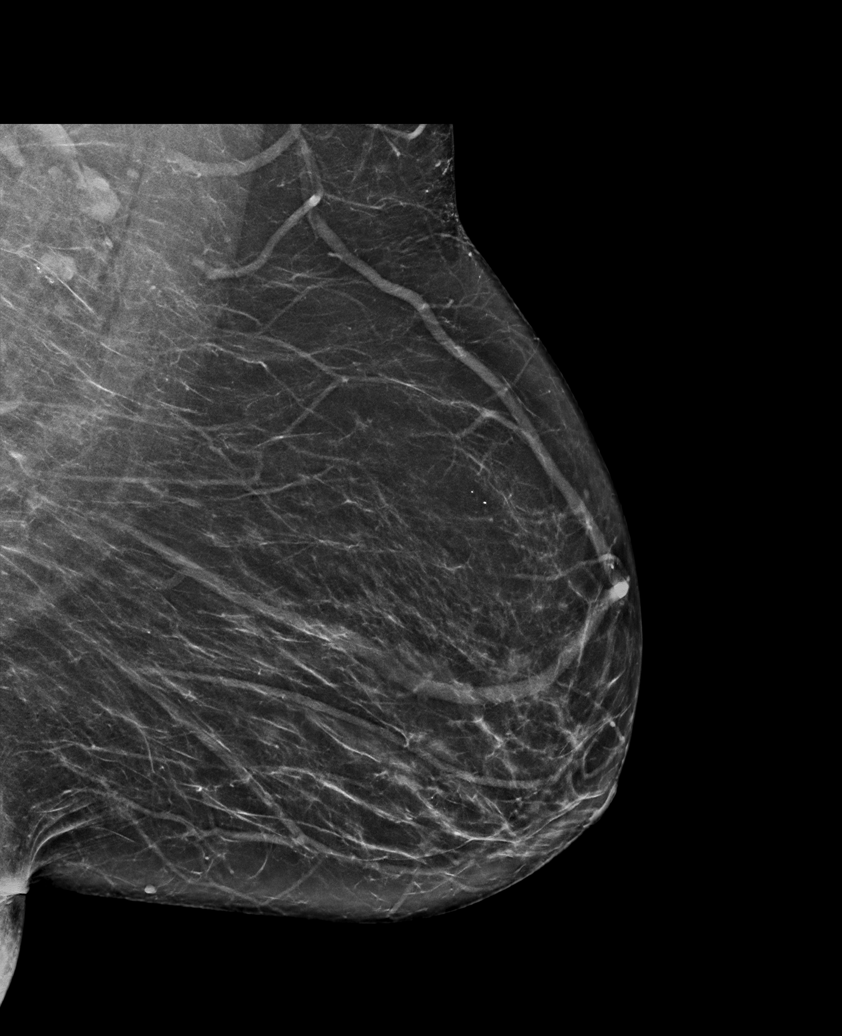

[R CC synth-2D]
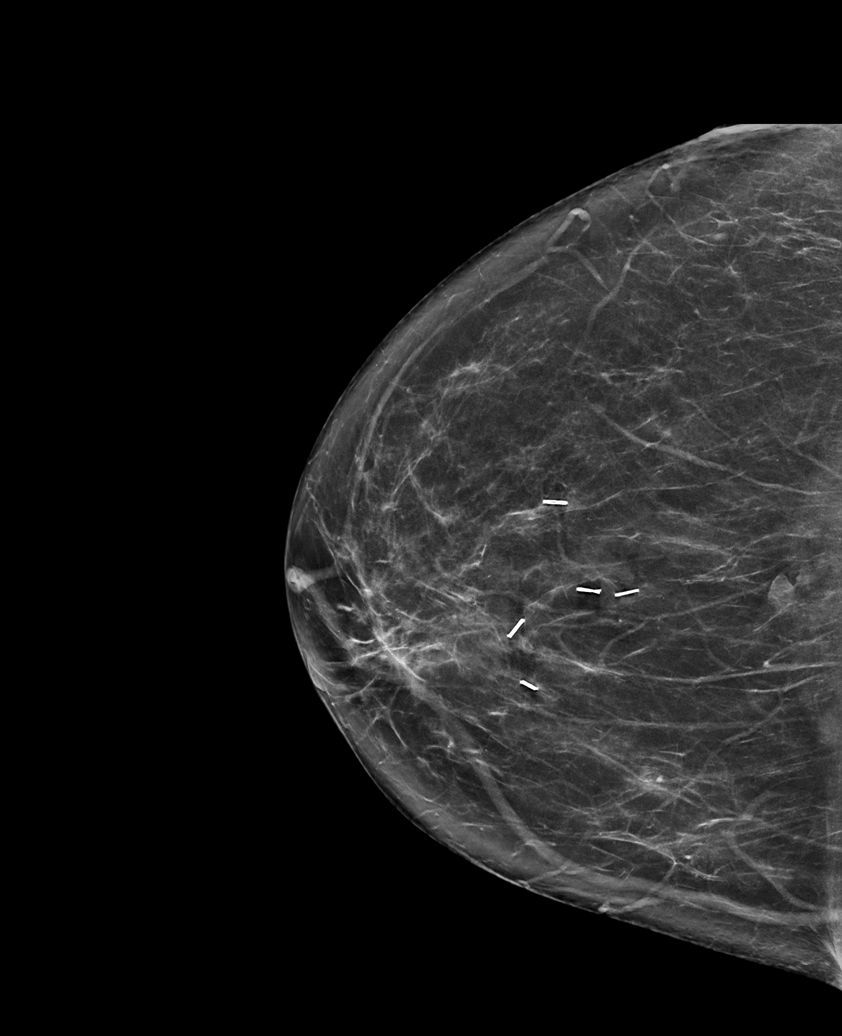

[L CC tomo · tomo slice 33/65.0]
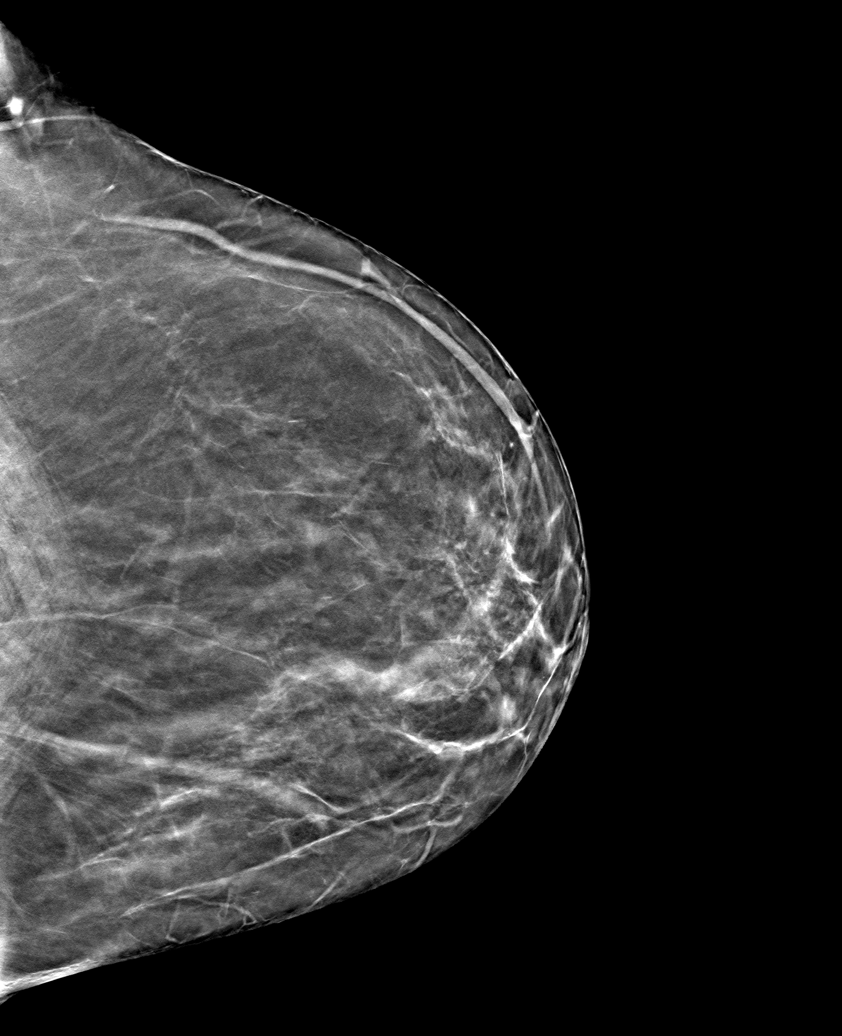

[6 of 25 positions shown; findings below may reference images not displayed]

ACR Breast Density Category b: There are scattered areas of
fibroglandular density.
FINDINGS: Post operative changes are seen in the RIGHTbreast. No suspicious
mass, distortion, or microcalcifications are identified to suggest
presence of malignancy.

Mammographic images were processed with CAD.
IMPRESSION: No mammographic evidence for malignancy.

RECOMMENDATION:
Diagnostic mammogram is suggested in 1 year. (Code:[2I])

I have discussed the findings and recommendations with the patient.
Results were also provided in writing at the conclusion of the
visit. If applicable, a reminder letter will be sent to the patient
regarding the next appointment.

BI-RADS CATEGORY  2: Benign.

## 2018-11-28 ENCOUNTER — Encounter: Payer: Self-pay | Admitting: Internal Medicine

## 2018-11-28 ENCOUNTER — Ambulatory Visit (INDEPENDENT_AMBULATORY_CARE_PROVIDER_SITE_OTHER): Payer: Medicare Other | Admitting: Internal Medicine

## 2018-11-28 ENCOUNTER — Other Ambulatory Visit: Payer: Self-pay

## 2018-11-28 ENCOUNTER — Ambulatory Visit (INDEPENDENT_AMBULATORY_CARE_PROVIDER_SITE_OTHER)
Admission: RE | Admit: 2018-11-28 | Discharge: 2018-11-28 | Disposition: A | Discharge status: Home / Self Care | Payer: Medicare Other | Source: Ambulatory Visit | Attending: Internal Medicine | Admitting: Internal Medicine

## 2018-11-28 VITALS — BP 118/60 | HR 72 | Temp 98.1°F | Ht 71.0 in | Wt 207.6 lb

## 2018-11-28 DIAGNOSIS — J449 Chronic obstructive pulmonary disease, unspecified: Secondary | ICD-10-CM

## 2018-11-28 DIAGNOSIS — R21 Rash and other nonspecific skin eruption: Secondary | ICD-10-CM | POA: Diagnosis not present

## 2018-11-28 DIAGNOSIS — I700 Atherosclerosis of aorta: Secondary | ICD-10-CM | POA: Diagnosis not present

## 2018-11-28 DIAGNOSIS — J984 Other disorders of lung: Secondary | ICD-10-CM | POA: Diagnosis not present

## 2018-11-28 DIAGNOSIS — J439 Emphysema, unspecified: Secondary | ICD-10-CM | POA: Diagnosis not present

## 2018-11-28 IMAGING — DX CHEST - 2 VIEW
2 series · 2 of 2 positions shown · non-contrast
Comparison: PA and lateral chest [DATE].

CLINICAL DATA: Physical exam.  No current complaints.

EXAM:
CHEST - 2 VIEW

[chest pa]
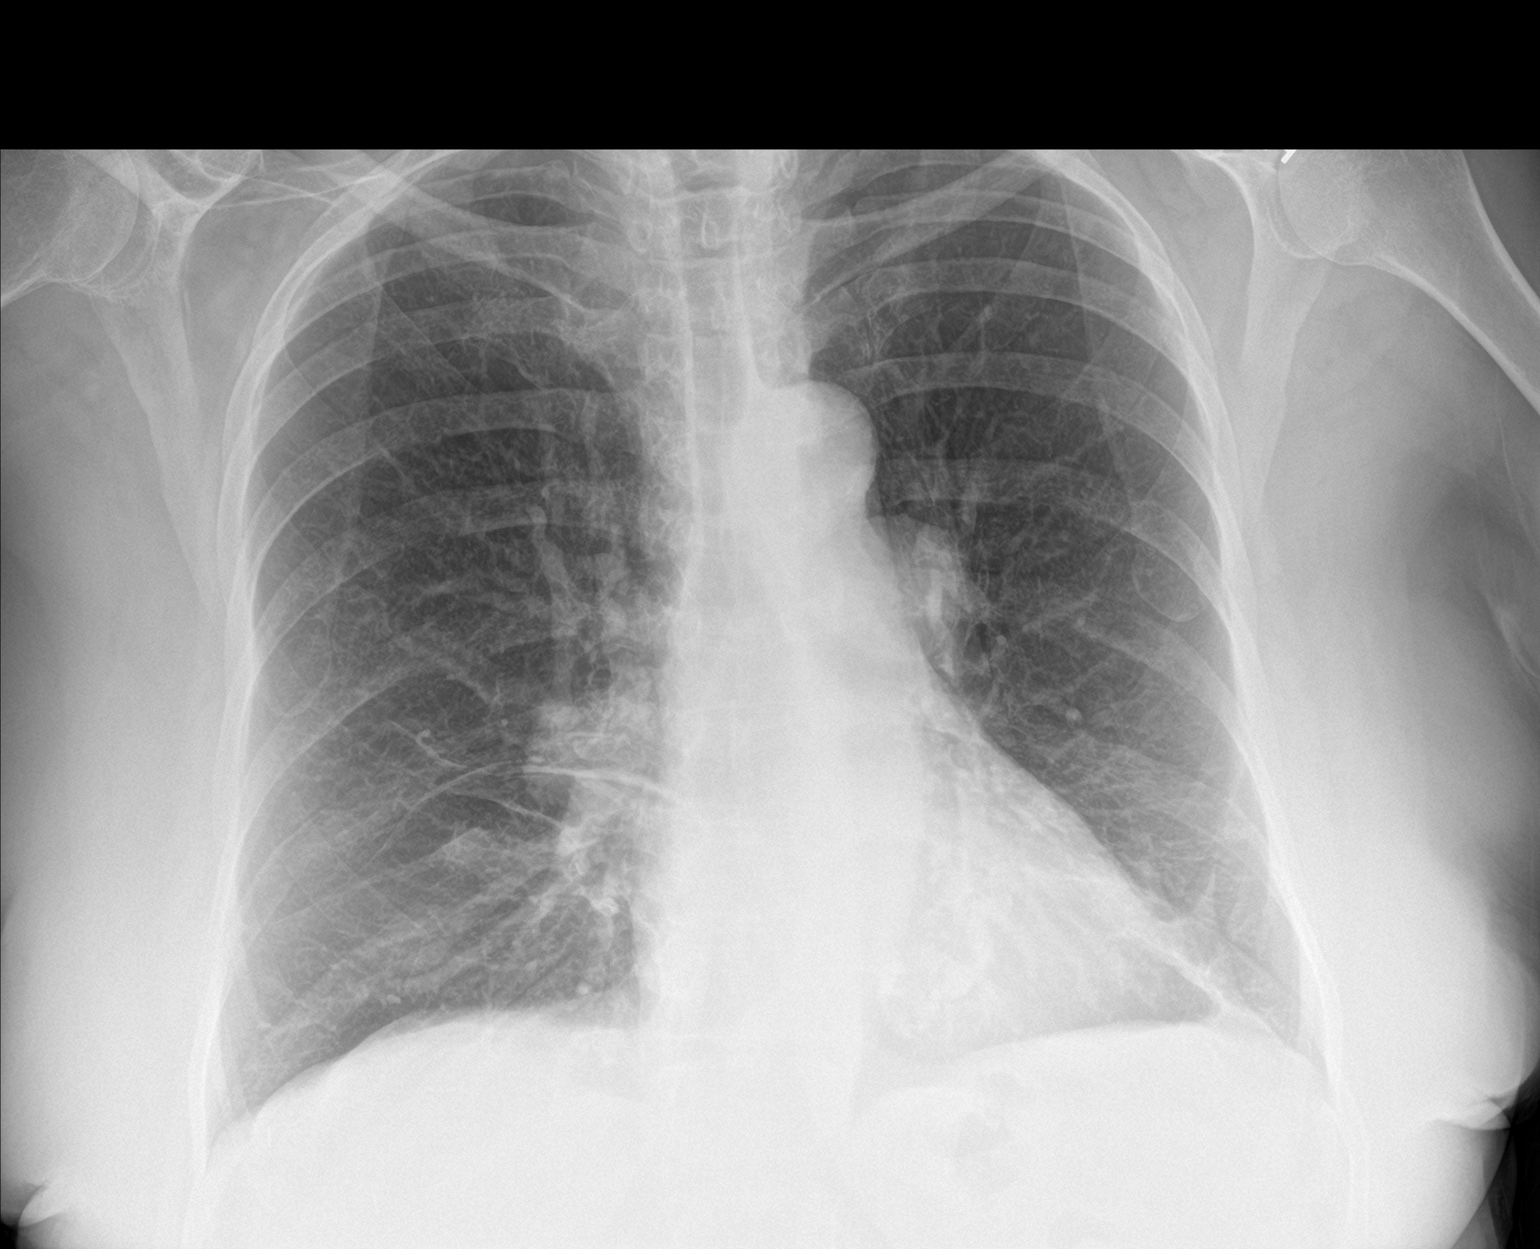

[chest lat]
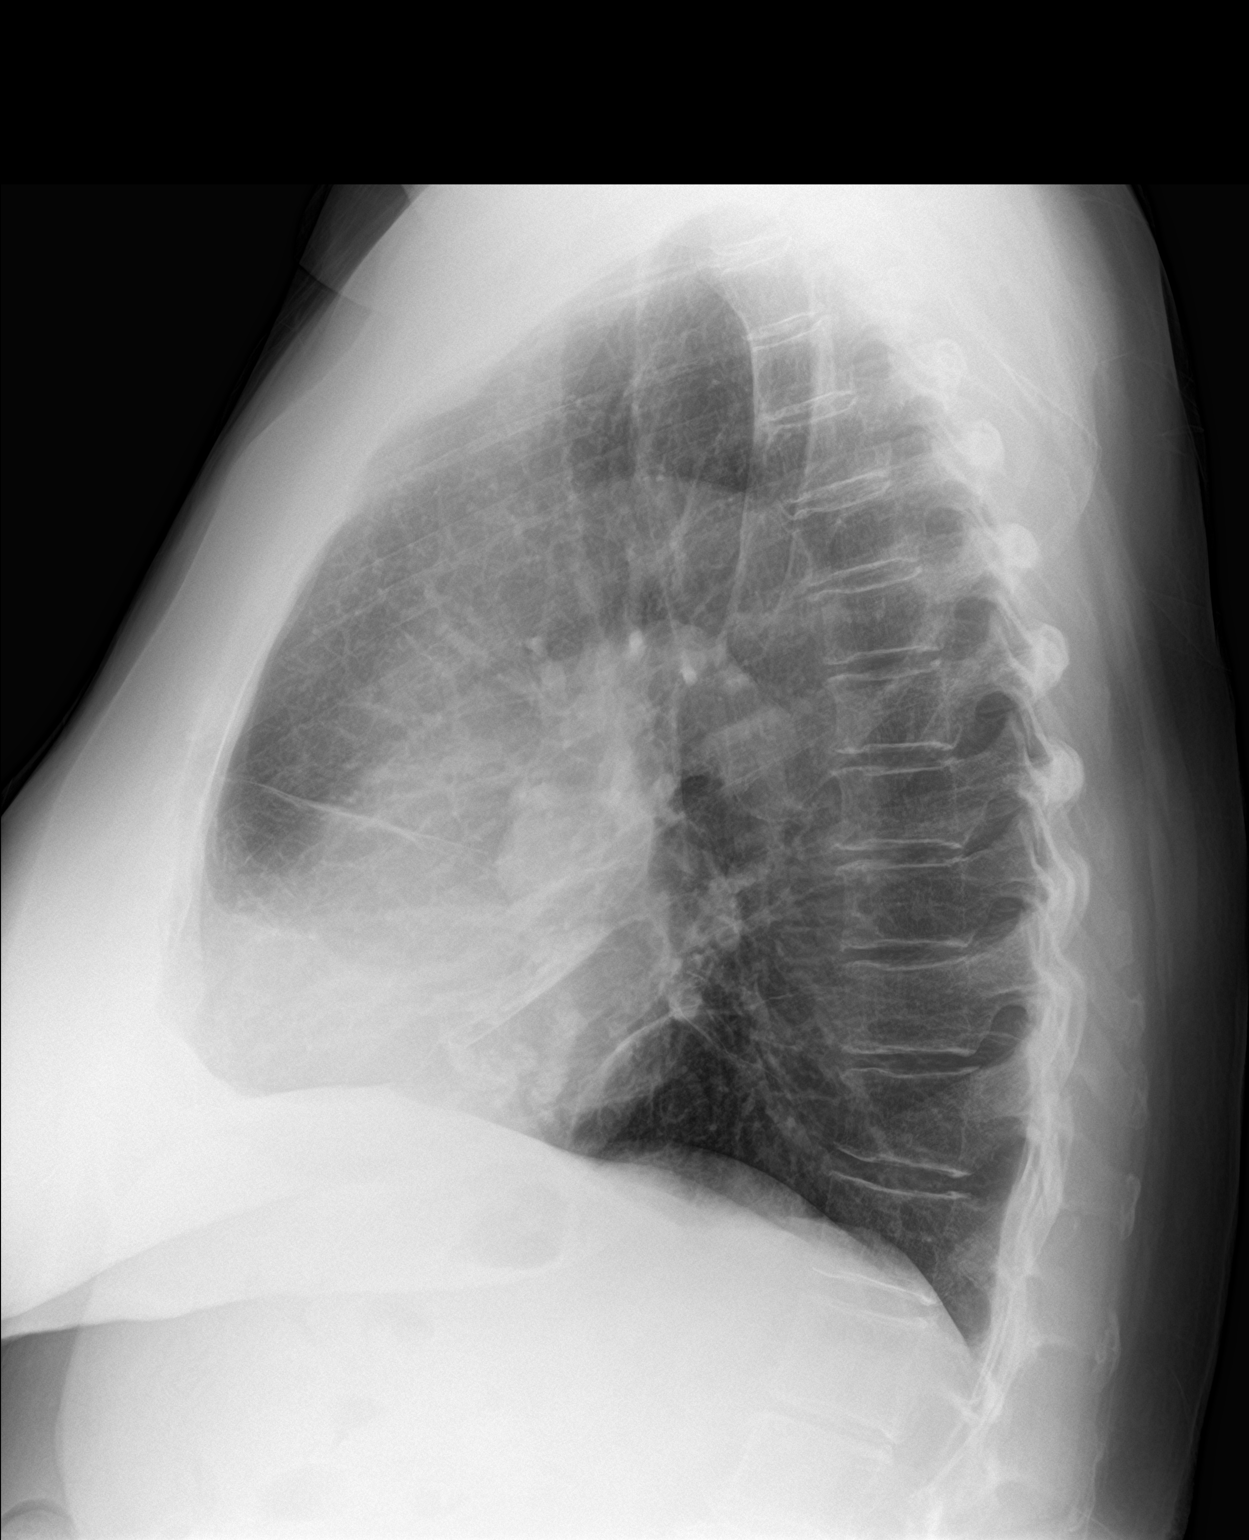

[2 of 2 positions shown; findings below may reference images not displayed]

FINDINGS: The chest is hyperexpanded with attenuation of the pulmonary
vasculature. Scar in the left lung base is unchanged. No
consolidative process, pneumothorax or effusion. Heart size is
normal. Aortic atherosclerosis is noted. No acute or focal bony
abnormality.
IMPRESSION: No acute disease.

Emphysema.

## 2018-11-28 NOTE — Progress Notes (Signed)
Patient ID: MCKAYLA MULCAHEY, female    DOB: April 25, 1941, 78 y.o.   MRN: [id number]  HPI F former smoker followed for Severe asthma/ COPD FEV1 59%,  complicated by allergic rhinitis,  paroxysmal A. Fib Labs 11/23/16- allergy profile total IgE 409 on Xolair, elevated specific IgE for dust mite and cockroach. EOS 3,300, Nl ANA and alpha-gal Derm Path report reviewed- Spongiotic/ eczema process- does not describe it as urticaria. Xolair was changed to [facility] June, 2018, to see if Xolair was causing her rash-no improvement after 5 months Office Spirometry 12/20/2017-very severe obstructive airways disease.  FVC 2.09/60%, FEV1 0.90/34%, ratio 0.43 Quantiferon tb Gold 06/22/17- Neg ---------------------------------------------------------------.  04/17/2018- 78 year old female former smoker followed for severe asthma/COPD/Nucala, complicated by allergic rhinitis, paroxysmal A. Fib, rash, breast cancer R,  Xolair was changed to Dupixent at request of her dermatologist, for eczema, with coverage also asthma/COPD.  Patient is being given by her dermatologist. -----4 month follow up for asthma. Per patient she is currently using Dupixent. Received her first injection 11/22. Since then, she has been more congested and increased SOB.  Wixela 250, Albuterol hfa,  She is unsure about the effectiveness of Dupixent but had it less than 2 weeks ago for the first dose. She says in the last couple of weeks she has been sleepier with less energy, more cough at night productive of white sputum and more dyspnea on exertion climbing stairs.  No acute event.  We realize the symptoms may be associated with the change of seasons. She is up-to-date on flu vaccine. Anticipating potential winter viral exacerbations-her compressor nebulizer machine is very old and she needs updated prescription. CXR 12/20/17-  IMPRESSION: COPD. Stable pleuroparenchymal scarring. No acute pneumonia nor CHF. Mitral annular calcification.  Calcification in the wall of the aortic arch.  11/28/2018- 79 year old female former smoker followed for severe asthma/COPD/N[place]ala, complicated by allergic rhinitis, paroxysmal A. Fib/ Xarelto, rash, breast cancer R,  Xolair was changed to Dupixent at request of her dermatologist, for eczema/ rash, with coverage also for her asthma/COPD. Now both are stopped and she feels stable with mild chronic dry cough typical for time of year. ------pt states her breathing is at baseline, has a cough every now and then, allergies d/t allergy season; using Wixela daily & proventil as needed Prednisone taper for viral exacerb during the winter.  Covid isolating at home. Using rescue 3x/ day.  Review of Systems-see HPI   + = positive Constitutional:   No-   weight loss, night sweats, fevers, chills, fatigue, lassitude. HEENT:   No-  headaches, difficulty swallowing, tooth/dental problems, sore throat,       No-  sneezing, +itching, ear ache,  nasal congestion, +post nasal drip,  CV:  No-   chest pain, orthopnea, PND, swelling in lower extremities, anasarca, dizziness, palpitations Resp:  + shortness of breath with exertion or at rest- unchanged             No-   productive cough,  + non-productive cough,  No-  coughing up of blood.              No-   change in color of mucus.  + wheezing.   Skin:  +rash or lesions. GI:  No-   heartburn, indigestion, abdominal pain, nausea, vomiting, GU:  MS:  +   joint pain or swelling.   Neuro- nothing unusual  Psych:  No- change in mood or affect. No depression or anxiety.  No memory loss  Objective:  General- Alert, Oriented, Affect-appropriate, Distress- none acute, Pleasant, + Overweight Skin- + numerous unremarkable keratoses on her back with mild diffuse erythema suggesting excoriation. Lymphadenopathy- none Head- atraumatic            Eyes- Gross vision intact, PERRLA, conjunctivae clear secretions            Ears- Hearing, canals normal            Nose-  Mild turbinate edema, No-Septal dev, mucus, polyps, erosion, perforation             Throat- Mallampati II , mucosa -not red, drainage- none, tonsils- atrophic Neck- flexible , trachea midline, no stridor , thyroid nl, carotid no bruit Chest - symmetrical excursion , unlabored           Heart/CV- RRR +, no murmur , no gallop  , no rub, nl s1 s2                           - JVD- none , edema- none, stasis changes- none, varices- none           Lung- +clear to P&A/ distant/unlabored, wheeze- none,  cough+ minimal, dullness-none, rub- none           Chest wall-  Abd-  Br/ Gen/ Rectal- Not done, not indicated Extrem- cyanosis- none, clubbing, none, atrophy- none, strength- nl Neuro- grossly intact to observation

## 2018-11-28 NOTE — Patient Instructions (Signed)
Ok to continue current meds  Order- CXR   Dx COPD mixed type  Please call if we can help

## 2018-11-28 NOTE — Assessment & Plan Note (Signed)
Well controlled now off Biologics. Felt Xolair had been best, but no longer needed. Feeling typical for time of year. Meds appropriate. Plan- CXr

## 2018-11-28 NOTE — Assessment & Plan Note (Signed)
  Rash finally resolved- not clear if spontaneous or due to treatments.

## 2018-12-12 DIAGNOSIS — C50911 Malignant neoplasm of unspecified site of right female breast: Secondary | ICD-10-CM | POA: Diagnosis not present

## 2018-12-28 NOTE — Unmapped (Signed)
Telephone triage completed for appt scheduled 12/29/2018.

## 2018-12-29 ENCOUNTER — Ambulatory Visit: Admit: 2018-12-29 | Discharge: 2018-12-30 | Payer: MEDICARE

## 2018-12-29 DIAGNOSIS — L309 Dermatitis, unspecified: Principal | ICD-10-CM

## 2018-12-29 MED ORDER — MIRTAZAPINE 15 MG TABLET
ORAL_TABLET | Freq: Every evening | ORAL | 2 refills | 30 days | Status: CP
Start: 2018-12-29 — End: 2019-03-29

## 2018-12-29 NOTE — Unmapped (Signed)
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:  - Currently failing triamcinolone 0.1% and possibly clobetasol ointment with intolerance to MTX, Imuran, Dupilumab  - Discussed that there are not many systemic options that remain - Cellcept (mycophenolate), could also consider plaquenil given sun sensitivity  -After lengthy discussion of risks associated with other systemic medication she elects to continue with topical therapy alone despite lack of complete control   -Continue to use triamcinolone ointment BID to the areas of rash PRN  - Use clobetasol ointment under occlusion to recalcitrant areas (like elbows); cotton gloves were provided today for hand occlusion  - START mirtazapine 15 mg nightly to help with itch and inadvertent scratching during sleep  - Consider superimposed allergic contact dermatitis and patch testing in the future  -Consider trial of light therapy, but patient notes photosensitive nature of rash (consider photoallergic reaction)  - Consider rebiopsy if needed    She will call and let me know the name of her super potent topical steroid that she is using.    RTC 6 months        CC:  Rash    HPI:  Courtney Walker is a 78 y.o. female last seen 08/04/2018 by Dr. Orma Flaming for eczematous rash who presents today for follow up of the same.     At the last visit, she was unable to tolerate Imuran 50 mg daily due to side effects of GI upset, fatigue and sensitive skin so this medication was stopped.  She was doing fairly well and decided to continue on topical medications alone given her side effects with other systemic medications.  She was advised to continue triamcinolone ointment twice daily as needed and clobetasol under occlusion to the most severe areas.  We also discussed a trial of light therapy in the future if needed.    Today, she reports she is flaring on her arms and legs.  Associated with itch, but no pain.  She has creams (triamcinolone and possibly clobetasol, though she cannot remember the name) that are helping with itch but only momentarily.  She does not use them under occlusion when needed.  She finds that the itching is throughout the day, but overnight she is unable to stop herself from scratching and wakes up with bloody sheets.    She does report that she is sensitive to the sun and gets sun burned easily.     Medications tried:  - Imuran - stopped due to side effects of GI upset, fatigue and sensitive skin at 50mg  daily.  She was fairly well controlled at that time (had just completed a prednisone course) and we decided to just continue topical steroids.  - Xolair - for asthma x 10 years, stopped because she was starting dupixent and  because she thought the rash on her skin might be an allergy to [employer]. No flare of asthma since discontinuation.  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 No fevers, chills, headaches, joint pains or other skin concerns.     PE:  General: Well-developed, well-nourished female, in no acute distress.   Neuro: Alert and oriented, and answers questions appropriately.  Skin: Per patient request, focused exam with inspection and palpation of the face, neck, bilateral upper extremities and bilateral lower extremities was performed today. Findings were normal with exception of the following:  - ERythematous, eczemaotus plaques on the elbows, forearms, palmar hands, right calf, left knee and ankle  - All other areas examined were normal or had no significant findings.

## 2018-12-29 NOTE — Unmapped (Addendum)
Topical steroids:  Triamcinolone 5  Clobetasol 10  Betamethasone 10    New itch medication:  Mirtazapine 15mg  every night before bed     [facility] [place] Hand Formula

## 2019-02-05 ENCOUNTER — Telehealth: Payer: Self-pay | Admitting: Internal Medicine

## 2019-02-05 NOTE — Telephone Encounter (Signed)
Called and spoke with pt regarding CY's results. Pt verbalized understanding and agreed to receiving her flu vaccine tomorrow 02/06/2019 at 9:00 AM EDT. Pt is negative for COVID screen. Appt has been scheduled. Nothing further needed at this time.

## 2019-02-05 NOTE — Telephone Encounter (Signed)
I suggest she get it any time now. It should last till Spring, and with Covid we are recommending not to wait.

## 2019-02-05 NOTE — Telephone Encounter (Signed)
Called and spoke with pt. Pt states she is curious as to when she should get her flu vaccine. She states CY usually recommends she receive the flu vaccine in late October so it will last her through March; however, due to [place], she is inquiring if she should take it earlier. She is requesting CY's recommendations.   CY, please advise. Thank you.

## 2019-02-06 ENCOUNTER — Ambulatory Visit (INDEPENDENT_AMBULATORY_CARE_PROVIDER_SITE_OTHER): Payer: Medicare Other

## 2019-02-06 ENCOUNTER — Other Ambulatory Visit: Payer: Self-pay

## 2019-02-06 DIAGNOSIS — Z23 Encounter for immunization: Secondary | ICD-10-CM

## 2019-02-19 ENCOUNTER — Telehealth: Payer: Self-pay | Admitting: *Deleted

## 2019-02-19 ENCOUNTER — Other Ambulatory Visit: Payer: Self-pay | Admitting: Cardiology

## 2019-02-19 NOTE — Telephone Encounter (Signed)
9f 94.2kg Scr 0.81 ccr 85.16mlmin Lovw/skains 05/03/18

## 2019-02-19 NOTE — Telephone Encounter (Signed)
Prescription refill request received for Xarelto. Pt is overdue for blood work.  Called and explained to pt that to make sure she is on the right dose of Xarelto we want to make sure she has updated blood work.  Pt has an annual physical on 02/26/2019 with her PCP. Pt stated she would ask them to check a CBC and a BMET when they check other blood work. Pt's Xarelto refill was already sent today by CMA

## 2019-02-23 DIAGNOSIS — L2089 Other atopic dermatitis: Secondary | ICD-10-CM

## 2019-02-23 MED ORDER — TRIAMCINOLONE ACETONIDE 0.1 % TOPICAL OINTMENT
INTRAMUSCULAR | 3 refills | 0.00000 days | Status: CP
Start: 2019-02-23 — End: ?

## 2019-02-26 ENCOUNTER — Other Ambulatory Visit: Payer: Self-pay

## 2019-02-26 ENCOUNTER — Encounter: Payer: Self-pay | Admitting: Internal Medicine

## 2019-02-26 ENCOUNTER — Other Ambulatory Visit: Payer: Self-pay | Admitting: *Deleted

## 2019-02-26 ENCOUNTER — Other Ambulatory Visit (INDEPENDENT_AMBULATORY_CARE_PROVIDER_SITE_OTHER): Payer: Medicare Other

## 2019-02-26 ENCOUNTER — Ambulatory Visit (INDEPENDENT_AMBULATORY_CARE_PROVIDER_SITE_OTHER): Payer: Medicare Other | Admitting: Internal Medicine

## 2019-02-26 VITALS — BP 110/80 | HR 71 | Temp 97.6°F | Ht 71.0 in | Wt 211.0 lb

## 2019-02-26 DIAGNOSIS — Z0000 Encounter for general adult medical examination without abnormal findings: Secondary | ICD-10-CM

## 2019-02-26 DIAGNOSIS — I4892 Unspecified atrial flutter: Secondary | ICD-10-CM

## 2019-02-26 DIAGNOSIS — J449 Chronic obstructive pulmonary disease, unspecified: Secondary | ICD-10-CM | POA: Diagnosis not present

## 2019-02-26 LAB — CBC
HCT: 45.9 % (ref 36.0–46.0)
Hemoglobin: 15.2 g/dL — ABNORMAL HIGH (ref 12.0–15.0)
MCHC: 33.1 g/dL (ref 30.0–36.0)
MCV: 92.1 fl (ref 78.0–100.0)
Platelets: 252 10*3/uL (ref 150.0–400.0)
RBC: 4.99 Mil/uL (ref 3.87–5.11)
RDW: 13.9 % (ref 11.5–15.5)
WBC: 6.2 10*3/uL (ref 4.0–10.5)

## 2019-02-26 LAB — LIPID PANEL
Cholesterol: 187 mg/dL (ref 0–200)
HDL: 39.1 mg/dL (ref >39.00)
NonHDL: 147.53
Total CHOL/HDL Ratio: 5
Triglycerides: 235 mg/dL — ABNORMAL HIGH (ref 0.0–149.0)
VLDL: 47 mg/dL — ABNORMAL HIGH (ref 0.0–40.0)

## 2019-02-26 LAB — TSH: TSH: 2.28 u[IU]/mL (ref 0.35–4.50)

## 2019-02-26 LAB — COMPREHENSIVE METABOLIC PANEL
ALT: 19 U/L (ref 0–35)
AST: 26 U/L (ref 0–37)
Albumin: 4.3 g/dL (ref 3.5–5.2)
Alkaline Phosphatase: 72 U/L (ref 39–117)
BUN: 20 mg/dL (ref 6–23)
CO2: 31 mEq/L (ref 19–32)
Calcium: 10.4 mg/dL (ref 8.4–10.5)
Chloride: 106 mEq/L (ref 96–112)
Creatinine, Ser: 0.97 mg/dL (ref 0.40–1.20)
GFR: 55.48 mL/min — ABNORMAL LOW (ref >60.00)
Glucose, Bld: 99 mg/dL (ref 70–99)
Potassium: 5.3 mEq/L — ABNORMAL HIGH (ref 3.5–5.1)
Sodium: 143 mEq/L (ref 135–145)
Total Bilirubin: 0.6 mg/dL (ref 0.2–1.2)
Total Protein: 7.5 g/dL (ref 6.0–8.3)

## 2019-02-26 LAB — HEMOGLOBIN A1C: Hgb A1c MFr Bld: 5.7 % (ref 4.6–6.5)

## 2019-02-26 LAB — LDL CHOLESTEROL, DIRECT: Direct LDL: 115 mg/dL

## 2019-02-26 MED ORDER — RIVAROXABAN 20 MG PO TABS: ORAL_TABLET | ORAL | 2 refills | Status: DC

## 2019-02-26 MED ORDER — CLOBETASOL 0.05 % TOPICAL OINTMENT
Freq: Two times a day (BID) | TOPICAL | 5 refills | 0 days | Status: CP
Start: 2019-02-26 — End: 2020-02-26

## 2019-02-26 NOTE — Progress Notes (Signed)
Subjective:   Patient ID: Laura Mendez, female    DOB: [birth year], 78 y.o.   MRN: [id number]  HPI Here for medicare wellness and physical, no new complaints. Please see A/P for status and treatment of chronic medical problems.   Diet: heart healthy Physical activity: walks daily Depression/mood screen: negative Hearing: intact to whispered voice Visual acuity: grossly normal, wears readers, performs annual eye exam  ADLs: capable Fall risk: low Home safety: good Cognitive evaluation: intact to orientation, naming, recall and repetition EOL planning: adv directives discussed    Office Visit from 02/26/2019 in [place]  PHQ-2 Total Score  0      I have personally reviewed and have noted 1. The patient's medical and social history - reviewed today no changes 2. Their use of alcohol, tobacco or illicit drugs 3. Their current medications and supplements 4. The patient's functional ability including ADL's, fall risks, home safety risks and hearing or visual impairment. 5. Diet and physical activities 6. Evidence for depression or mood disorders 7. Care team reviewed and updated  Patient Care Team: Hoyt Koch, MD as PCP - General (Internal Medicine) Jerline Pain, MD as PCP - Cardiology (Cardiology) Rolm Bookbinder, MD as Consulting Physician (General Surgery) Nicholas Lose, MD as Consulting Physician (Hematology and Oncology) Gery Pray, MD as Consulting Physician (Radiation Oncology) Past Medical History:  Diagnosis Date  . Asthma   . Chronic airway obstruction, not elsewhere classified   . Diastolic dysfunction without heart failure   . Disorder of bone and cartilage, unspecified   . Dysrhythmia    A-Fib  . Family history of colon cancer   . Family history of kidney cancer   . Family history of ovarian cancer   . Paroxysmal atrial flutter ([place])   . Stroke [facility])    TIA  . TIA (transient ischemic attack)   .  Unspecified asthma(493.90)    Past Surgical History:  Procedure Laterality Date  . ABDOMINAL HYSTERECTOMY    . BREAST LUMPECTOMY Right 12/30/2017  . BREAST LUMPECTOMY WITH RADIOACTIVE SEED LOCALIZATION Right 12/30/2017   Procedure: BREAST LUMPECTOMY WITH RADIOACTIVE SEED LOCALIZATION X'S 3;  Surgeon: Rolm Bookbinder, MD;  Location: [place];  Service: General;  Laterality: Right;  . BUNIONECTOMY Bilateral   . EYE SURGERY Bilateral    cataract removal   Family History  Problem Relation Age of Onset  . COPD Father   . Colon cancer Mother 41       mets to pancreas and liver  . Kidney cancer Sister 74       d. 40  . Lung cancer Sister        d. 62, smoker  . Ovarian cancer Sister 73       d. 35  . Cirrhosis Sister        d. 80  . Melanoma Sister 21  . Ovarian cancer Maternal Grandmother        d. 69  . Heart disease Maternal Grandfather        rheumatic heart disease  . Rectal cancer Paternal Grandfather        d. 35  . Parkinson's disease Brother   . Ovarian cancer Maternal Aunt   . Colon cancer Maternal Uncle        d. 72-73  . Heart attack Paternal Uncle        d. 60  . Kidney cancer Brother 40  . Colon cancer Maternal Uncle  d. 23  . Cancer Other        MGMs pat 1/2 sister with uterine and rectal cancer  . Colon cancer Cousin        d. 42 - mat first cousin  . Breast cancer Neg Hx    Review of Systems  Constitutional: Negative.   HENT: Negative.   Eyes: Negative.   Respiratory: Negative for cough, chest tightness and shortness of breath.   Cardiovascular: Negative for chest pain, palpitations and leg swelling.  Gastrointestinal: Negative for abdominal distention, abdominal pain, constipation, diarrhea, nausea and vomiting.  Musculoskeletal: Negative.   Skin: Negative.   Neurological: Negative.   Psychiatric/Behavioral: Negative.     Objective:  Physical Exam Constitutional:      Appearance: She is well-developed.  HENT:     Head: Normocephalic and  atraumatic.  Neck:     Musculoskeletal: Normal range of motion.  Cardiovascular:     Rate and Rhythm: Normal rate and regular rhythm.  Pulmonary:     Effort: Pulmonary effort is normal. No respiratory distress.     Breath sounds: Normal breath sounds. No wheezing or rales.  Abdominal:     General: Bowel sounds are normal. There is no distension.     Palpations: Abdomen is soft.     Tenderness: There is no abdominal tenderness. There is no rebound.  Skin:    General: Skin is warm and dry.  Neurological:     Mental Status: She is alert and oriented to person, place, and time.     Coordination: Coordination normal.     Vitals:   02/26/19 0834  BP: 110/80  Pulse: 71  Temp: 97.6 F (36.4 C)  TempSrc: Oral  SpO2: 97%  Weight: 211 lb (95.7 kg)  Height: 5\' 11"  (1.803 m)    Assessment & Plan:

## 2019-02-26 NOTE — Assessment & Plan Note (Signed)
Flu shot complete. Pneumonia complete. Shingrix counseled. Tetanus due 2027. Colonoscopy aged out prior to recall. Mammogram yearly due to recent cancer, pap smear aged out and dexa due 2021. Counseled about sun safety and mole surveillance. Counseled about the dangers of distracted driving. Given 10 year screening recommendations.

## 2019-02-26 NOTE — Assessment & Plan Note (Signed)
Doing well and sees pulmonary yearly. No flare today.

## 2019-02-26 NOTE — Patient Instructions (Signed)
Health Maintenance, Female Adopting a healthy lifestyle and getting preventive care are important in promoting health and wellness. Ask your health care provider about:  The right schedule for you to have regular tests and exams.  Things you can do on your own to prevent diseases and keep yourself healthy. What should I know about diet, weight, and exercise? Eat a healthy diet   Eat a diet that includes plenty of vegetables, fruits, low-fat dairy products, and lean protein.  Do not eat a lot of foods that are high in solid fats, added sugars, or sodium. Maintain a healthy weight Body mass index (BMI) is used to identify weight problems. It estimates body fat based on height and weight. Your health care provider can help determine your BMI and help you achieve or maintain a healthy weight. Get regular exercise Get regular exercise. This is one of the most important things you can do for your health. Most adults should:  Exercise for at least 150 minutes each week. The exercise should increase your heart rate and make you sweat (moderate-intensity exercise).  Do strengthening exercises at least twice a week. This is in addition to the moderate-intensity exercise.  Spend less time sitting. Even light physical activity can be beneficial. Watch cholesterol and blood lipids Have your blood tested for lipids and cholesterol at 78 years of age, then have this test every 5 years. Have your cholesterol levels checked more often if:  Your lipid or cholesterol levels are high.  You are older than 78 years of age.  You are at high risk for heart disease. What should I know about cancer screening? Depending on your health history and family history, you may need to have cancer screening at various ages. This may include screening for:  Breast cancer.  Cervical cancer.  Colorectal cancer.  Skin cancer.  Lung cancer. What should I know about heart disease, diabetes, and high blood  pressure? Blood pressure and heart disease  High blood pressure causes heart disease and increases the risk of stroke. This is more likely to develop in people who have high blood pressure readings, are of African descent, or are overweight.  Have your blood pressure checked: ? Every 3-5 years if you are 18-39 years of age. ? Every year if you are 40 years old or older. Diabetes Have regular diabetes screenings. This checks your fasting blood sugar level. Have the screening done:  Once every three years after age 40 if you are at a normal weight and have a low risk for diabetes.  More often and at a younger age if you are overweight or have a high risk for diabetes. What should I know about preventing infection? Hepatitis B If you have a higher risk for hepatitis B, you should be screened for this virus. Talk with your health care provider to find out if you are at risk for hepatitis B infection. Hepatitis C Testing is recommended for:  Everyone born from 1945 through 1965.  Anyone with known risk factors for hepatitis C. Sexually transmitted infections (STIs)  Get screened for STIs, including gonorrhea and chlamydia, if: ? You are sexually active and are younger than 78 years of age. ? You are older than 78 years of age and your health care provider tells you that you are at risk for this type of infection. ? Your sexual activity has changed since you were last screened, and you are at increased risk for chlamydia or gonorrhea. Ask your health care provider if   you are at risk.  Ask your health care provider about whether you are at high risk for HIV. Your health care provider may recommend a prescription medicine to help prevent HIV infection. If you choose to take medicine to prevent HIV, you should first get tested for HIV. You should then be tested every 3 months for as long as you are taking the medicine. Pregnancy  If you are about to stop having your period (premenopausal) and  you may become pregnant, seek counseling before you get pregnant.  Take 400 to 800 micrograms (mcg) of folic acid every day if you become pregnant.  Ask for birth control (contraception) if you want to prevent pregnancy. Osteoporosis and menopause Osteoporosis is a disease in which the bones lose minerals and strength with aging. This can result in bone fractures. If you are 65 years old or older, or if you are at risk for osteoporosis and fractures, ask your health care provider if you should:  Be screened for bone loss.  Take a calcium or vitamin D supplement to lower your risk of fractures.  Be given hormone replacement therapy (HRT) to treat symptoms of menopause. Follow these instructions at home: Lifestyle  Do not use any products that contain nicotine or tobacco, such as cigarettes, e-cigarettes, and chewing tobacco. If you need help quitting, ask your health care provider.  Do not use street drugs.  Do not share needles.  Ask your health care provider for help if you need support or information about quitting drugs. Alcohol use  Do not drink alcohol if: ? Your health care provider tells you not to drink. ? You are pregnant, may be pregnant, or are planning to become pregnant.  If you drink alcohol: ? Limit how much you use to 0-1 drink a day. ? Limit intake if you are breastfeeding.  Be aware of how much alcohol is in your drink. In the [place] one drink equals one 12 oz bottle of beer (355 mL), one 5 oz glass of wine (148 mL), or one 1 oz glass of hard liquor (44 mL). General instructions  Schedule regular health, dental, and eye exams.  Stay current with your vaccines.  Tell your health care provider if: ? You often feel depressed. ? You have ever been abused or do not feel safe at home. Summary  Adopting a healthy lifestyle and getting preventive care are important in promoting health and wellness.  Follow your health care provider's instructions about healthy  diet, exercising, and getting tested or screened for diseases.  Follow your health care provider's instructions on monitoring your cholesterol and blood pressure. This information is not intended to replace advice given to you by your health care provider. Make sure you discuss any questions you have with your health care provider. Document Released: 11/23/2010 Document Revised: 05/03/2018 Document Reviewed: 05/03/2018 [employer] Patient Education  2020 [employer]  

## 2019-02-26 NOTE — Assessment & Plan Note (Signed)
Appears in sinus currently, taking xarelto daily.

## 2019-02-26 NOTE — Telephone Encounter (Signed)
Patient called and stated she wanted to update [place] and let us know she had her labs drawn at her PCP office today to ensure her Xarelto dose. Assessed the chart and able to view labs.  Xarelto 20mg  refill request received; pt is 78 years old, weight-95.7kg, Crea-0.97 on 02/26/2019, last seen by Dr.Skains on 05/03/18, Diagnosis-Afib, CrCl- 72.45ml/min; Dose is appropriate based on dosing criteria. Will send in refill to requested pharmacy.

## 2019-02-27 ENCOUNTER — Telehealth: Payer: Self-pay | Admitting: Internal Medicine

## 2019-02-27 MED ORDER — ALBUTEROL SULFATE HFA 108 (90 BASE) MCG/ACT IN AERS: INHALATION_SPRAY | RESPIRATORY_TRACT | 3 refills | Status: DC

## 2019-02-27 MED ORDER — FLUTICASONE-SALMETEROL 250-50 MCG/DOSE IN AEPB: 1.0000 | INHALATION_SPRAY | Freq: Two times a day (BID) | RESPIRATORY_TRACT | 0 refills | Status: DC

## 2019-02-27 NOTE — Telephone Encounter (Signed)
Called and spoke w/ pt. Pt requested refills for albuterol and Wixela sent to [facility] on [address]. Pt last seen 11/28/2018 with CY, where CY recommended pt continue on current medications. I let pt know we have placed the order for refills per her request, which she verbalized understanding to. Nothing further needed at this time.

## 2019-03-12 ENCOUNTER — Ambulatory Visit
Admit: 2019-03-12 | Discharge: 2019-03-13 | Payer: MEDICARE | Attending: Student in an Organized Health Care Education/Training Program | Primary: Student in an Organized Health Care Education/Training Program

## 2019-03-12 DIAGNOSIS — Z79899 Other long term (current) drug therapy: Principal | ICD-10-CM

## 2019-03-12 DIAGNOSIS — L309 Dermatitis, unspecified: Secondary | ICD-10-CM | POA: Diagnosis not present

## 2019-03-12 NOTE — Unmapped (Addendum)
Patient Education        mycophenolate mofetil (oral/injection)  Pronunciation:  MYE koe FEN oh late MOE fe til  Brand:  CellCept  What is the most important information I should know about mycophenolate mofetil?  This medicine can cause a miscarriage or birth defects when used during pregnancy. Both men and women using mycophenolate mofetil should use effective birth control to prevent pregnancy.  Mycophenolate mofetil may cause your body to overproduce white blood cells. This can lead to cancer, severe brain infection causing disability or death, or a viral infection causing kidney transplant failure.  Call your doctor right away if you have symptoms such as: fever, swollen glands, night sweats, weight loss, vomiting or diarrhea, burning when you urinate, a new skin lesion, any change in your mental state, weakness on one side of your body, problems with speech or vision, or tenderness near your transplanted kidney.  What is mycophenolate mofetil?  Mycophenolate mofetil is used with other medicines to prevent organ rejection after a kidney, liver, or heart transplant. Organ rejection happens when the immune system treats the new organ as an invader and attacks it.  Mycophenolate mofetil may also be used for purposes not listed in this medication guide.  What should I discuss with my healthcare provider before using mycophenolate mofetil?  You should not use mycophenolate mofetil if you are allergic to it, mycophenolic acid (Myfortic), or to an ingredient called Polysorbate 80.  Talk with your doctor about the risks and benefits of mycophenolate mofetil. This medicine can affect your immune system, and may cause overproduction of certain white blood cells. This can lead to cancer, severe brain infection causing disability or death, or a viral infection causing kidney transplant failure.  Tell your doctor if you have ever had:  ?? a stomach ulcer or problems with digestion;  ?? diabetes;  ?? hepatitis B or C; ?? phenylketonuria, or PKU (the liquid form of this medicine may contain phenylalanine); or  ?? a rare inherited enzyme deficiency such as Lesch-Nyhan syndrome or Kelley-Seegmiller syndrome.  Mycophenolate mofetil can cause a miscarriage or birth defects if the mother or the father is using this medicine.  ?? If you are a woman, do not use mycophenolate mofetil if you are pregnant. Use effective birth control to prevent pregnancy while you are using this medicine and for at least 6 weeks after your last dose.  ?? If you are a man, use effective birth control if your sex partner is able to get pregnant. Keep using birth control for at least 90 days after your last dose.  ?? Tell your doctor right away if a pregnancy occurs while either the mother or the father is using mycophenolate mofetil.  You may need to have a negative pregnancy test before starting this treatment.  Mycophenolate mofetil can make birth control pills less effective. Ask your doctor about using a non-hormonal birth control (condom, diaphragm, cervical cap, or contraceptive sponge) to prevent pregnancy.  If a pregnancy occurs during treatment, do not stop using mycophenolate mofetil. Call your doctor for instructions. Also call the Mycophenolate Pregnancy Registry [phone]).  You should not breastfeed while using mycophenolate mofetil.  Mycophenolate mofetil is not approved for use for heart or liver transplants by anyone younger than 78 years old. This medicine is not approved for use for kidney transplants by anyone younger than 62 months old.  How should I use mycophenolate mofetil?  Follow all directions on your prescription label and read all medication guides or  instruction sheets. Use the medicine exactly as directed.  You must remain under the care of a doctor while you are using mycophenolate mofetil.  Mycophenolate mofetil injection is given as an infusion into a vein. A healthcare provider will give you this injection. Take oral mycophenolate mofetil on an empty stomach, at least 1 hour before or 2 hours after a meal.  Swallow the capsule whole and do not crush, chew, break, or open it. Tell your doctor if you have trouble swallowing a tablet or capsule.  Shake the oral suspension (liquid) before you measure a dose. Use the dosing syringe provided, or use a medicine dose-measuring device (not a kitchen spoon).  Read and carefully follow any Instructions for Use provided with your medicine. Ask your doctor or pharmacist if you do not understand these instructions.  Your dose needs may change if you switch to a different brand, strength, or form of this medicine.  Avoid medication errors by using only the form and strength your doctor prescribes.  You will need frequent medical tests.  If you've ever had hepatitis B or C, using mycophenolate mofetil can cause this virus to become active or get worse. You may need frequent liver function tests while using this medicine and for several months after you stop.  Store at room temperature away from moisture and heat. Keep the bottle tightly closed when not in use. Throw away any unused liquid that is older than 60 days.  The liquid medicine may also be stored in the refrigerator. Do not freeze.  What happens if I miss a dose?  Use the medicine as soon as you can, but skip the missed dose if it is less than 2 hours away for your next dose. Do not use two doses at one time.  What happens if I overdose?  Seek emergency medical attention or call the Poison Help line at [phone].  What should I avoid while using mycophenolate mofetil?  Avoid driving or hazardous activity until you know how this medicine will affect you. Your reactions could be impaired.  You must not donate blood or sperm while using this medicine, and for at least 6 weeks (for blood) or 90 days (for sperm) after your last dose. Do not receive a live vaccine while using mycophenolate mofetil. The vaccine may not work as well and may not fully protect you from disease. Live vaccines include measles, mumps, rubella (MMR), rotavirus, typhoid, yellow fever, varicella (chickenpox), zoster (shingles), and nasal flu (influenza) vaccine.  Mycophenolate mofetil can make you sunburn more easily. Avoid sunlight or tanning beds. Wear protective clothing and use sunscreen (SPF 30 or higher) when you are outdoors.  What are the possible side effects of mycophenolate mofetil?  Get emergency medical help if you have signs of an allergic reaction: hives; difficult breathing; swelling of your face, lips, tongue, or throat.  Mycophenolate mofetil can affect your immune system, and may cause certain white blood cells to grow out of control. Call your doctor right away if you have:  ?? fever, swollen glands, painful mouth sores, cold or flu symptoms, headache, ear pain;  ?? stomach pain, vomiting, diarrhea, weight loss;  ?? weakness on one side of your body, loss of muscle control;  ?? confusion, thinking problems, loss of interest in things that normally interest you;  ?? pain or burning when you urinate;  ?? tenderness around the transplanted kidney;  ?? swelling, warmth, redness, or oozing around a skin wound; or  ?? a new skin  lesion, or a mole that has changed in size or color.  Also call your doctor at once if you have:  ?? bloody or tarry stools, coughing up blood or vomit that looks like coffee grounds;  ?? a blood infection (sepsis) --fever, flu symptoms, mouth and throat ulcers, rapid heart rate, shallow breathing; or  ?? low blood cell counts --fever, chills, tiredness, mouth sores, skin sores, easy bruising, unusual bleeding, pale skin, cold hands and feet, feeling light-headed or short of breath.  Common side effects may include:  ?? stomach pain, nausea, vomiting, diarrhea, constipation;  ?? swelling in your ankles or feet;  ?? rash; ?? headache, dizziness, tremors;  ?? fever, sore throat, cold symptoms, or other signs of infections;  ?? high blood sugar;  ?? abnormal blood tests;  ?? pain anywhere in your body;  ?? low blood cell counts; or  ?? increased blood pressure or heart rate.  This is not a complete list of side effects and others may occur. Call your doctor for medical advice about side effects. You may report side effects to FDA at 1-800-FDA-1088.  What other drugs will affect mycophenolate mofetil?  If you take sevelamer or an antacid, take your oral mycophenolate mofetil dose 2 hours before you take these other medicines.  Sometimes it is not safe to use certain medications at the same time. Some drugs can affect your blood levels of other drugs you take, which may increase side effects or make the medications less effective.  Tell your doctor about all your other medicines, especially:  ?? azathioprine;  ?? birth control pills or hormone replacement therapy;  ?? cholestyramine;  ?? antiviral medicine --acyclovir, ganciclovir, valacyclovir, valganciclovir;  ?? an antibiotic --amoxicillin, ciprofloxacin, metronidazole, norfloxacin, rifampin, sulfa drugs (SMX-TMP or SMZ-TMP, and others); or  ?? a stomach acid reducer --esomeprazole, lansoprazole, omeprazole, Nexium, Prevacid, Prilosec, Protonix, and others.  This list is not complete. Other drugs may affect mycophenolate mofetil, including prescription and over-the-counter medicines, vitamins, and herbal products. Not all possible drug interactions are listed here.  Where can I get more information?  Your doctor or pharmacist can provide more information about mycophenolate mofetil.  Remember, keep this and all other medicines out of the reach of children, never share your medicines with others, and use this medication only for the indication prescribed. Every effort has been made to ensure that the information provided by [employer] ('[employer]') is accurate, up-to-date, and complete, but no guarantee is made to that effect. Drug information contained herein may be time sensitive. [employer] information has been compiled for use by healthcare practitioners and consumers in the [place] and therefore [employer] does not warrant that uses outside of the [place] are appropriate, unless specifically indicated otherwise. [employer]'s drug information does not endorse drugs, diagnose patients or recommend therapy. [employer]'s drug information is an Investment [employer], corporate to assist licensed healthcare practitioners in caring for their patients and/or to serve consumers viewing this service as a supplement to, and not a substitute for, the expertise, skill, knowledge and judgment of healthcare practitioners. The absence of a warning for a given drug or drug combination in no way should be construed to indicate that the drug or drug combination is safe, effective or appropriate for any given patient. [employer] does not assume any responsibility for any aspect of healthcare administered with the aid of information [employer] provides. The information contained herein is not intended to cover all possible uses, directions, precautions, warnings, drug interactions, allergic reactions, or  adverse effects. If you have questions about the drugs you are taking, check with your doctor, nurse or pharmacist.  Copyright [id number] [employer] Version: 10.01. Revision date: 06/16/2018.  Care instructions adapted under license by [facility]. If you have questions about a medical condition or this instruction, always ask your healthcare professional. [employer] disclaims any warranty or liability for your use of this information.     Patient Education        mycophenolate mofetil (oral/injection)  Pronunciation:  MYE koe FEN oh late MOE fe til  Brand:  CellCept What is the most important information I should know about mycophenolate mofetil?  This medicine can cause a miscarriage or birth defects when used during pregnancy. Both men and women using mycophenolate mofetil should use effective birth control to prevent pregnancy.  Mycophenolate mofetil may cause your body to overproduce white blood cells. This can lead to cancer, severe brain infection causing disability or death, or a viral infection causing kidney transplant failure.  Call your doctor right away if you have symptoms such as: fever, swollen glands, night sweats, weight loss, vomiting or diarrhea, burning when you urinate, a new skin lesion, any change in your mental state, weakness on one side of your body, problems with speech or vision, or tenderness near your transplanted kidney.  What is mycophenolate mofetil?  Mycophenolate mofetil is used with other medicines to prevent organ rejection after a kidney, liver, or heart transplant. Organ rejection happens when the immune system treats the new organ as an invader and attacks it.  Mycophenolate mofetil may also be used for purposes not listed in this medication guide.  What should I discuss with my healthcare provider before using mycophenolate mofetil?  You should not use mycophenolate mofetil if you are allergic to it, mycophenolic acid (Myfortic), or to an ingredient called Polysorbate 80.  Talk with your doctor about the risks and benefits of mycophenolate mofetil. This medicine can affect your immune system, and may cause overproduction of certain white blood cells. This can lead to cancer, severe brain infection causing disability or death, or a viral infection causing kidney transplant failure.  Tell your doctor if you have ever had:  ?? a stomach ulcer or problems with digestion;  ?? diabetes;  ?? hepatitis B or C;  ?? phenylketonuria, or PKU (the liquid form of this medicine may contain phenylalanine); or ?? a rare inherited enzyme deficiency such as Lesch-Nyhan syndrome or Kelley-Seegmiller syndrome.  Mycophenolate mofetil can cause a miscarriage or birth defects if the mother or the father is using this medicine.  ?? If you are a woman, do not use mycophenolate mofetil if you are pregnant. Use effective birth control to prevent pregnancy while you are using this medicine and for at least 6 weeks after your last dose.  ?? If you are a man, use effective birth control if your sex partner is able to get pregnant. Keep using birth control for at least 90 days after your last dose.  ?? Tell your doctor right away if a pregnancy occurs while either the mother or the father is using mycophenolate mofetil.  You may need to have a negative pregnancy test before starting this treatment.  Mycophenolate mofetil can make birth control pills less effective. Ask your doctor about using a non-hormonal birth control (condom, diaphragm, cervical cap, or contraceptive sponge) to prevent pregnancy.  If a pregnancy occurs during treatment, do not stop using mycophenolate mofetil. Call your doctor for instructions. Also call the  Mycophenolate Pregnancy Registry [phone]).  You should not breastfeed while using mycophenolate mofetil.  Mycophenolate mofetil is not approved for use for heart or liver transplants by anyone younger than 77 years old. This medicine is not approved for use for kidney transplants by anyone younger than 57 months old.  How should I use mycophenolate mofetil?  Follow all directions on your prescription label and read all medication guides or instruction sheets. Use the medicine exactly as directed.  You must remain under the care of a doctor while you are using mycophenolate mofetil.  Mycophenolate mofetil injection is given as an infusion into a vein. A healthcare provider will give you this injection.  Take oral mycophenolate mofetil on an empty stomach, at least 1 hour before or 2 hours after a meal. Swallow the capsule whole and do not crush, chew, break, or open it. Tell your doctor if you have trouble swallowing a tablet or capsule.  Shake the oral suspension (liquid) before you measure a dose. Use the dosing syringe provided, or use a medicine dose-measuring device (not a kitchen spoon).  Read and carefully follow any Instructions for Use provided with your medicine. Ask your doctor or pharmacist if you do not understand these instructions.  Your dose needs may change if you switch to a different brand, strength, or form of this medicine.  Avoid medication errors by using only the form and strength your doctor prescribes.  You will need frequent medical tests.  If you've ever had hepatitis B or C, using mycophenolate mofetil can cause this virus to become active or get worse. You may need frequent liver function tests while using this medicine and for several months after you stop.  Store at room temperature away from moisture and heat. Keep the bottle tightly closed when not in use. Throw away any unused liquid that is older than 60 days.  The liquid medicine may also be stored in the refrigerator. Do not freeze.  What happens if I miss a dose?  Use the medicine as soon as you can, but skip the missed dose if it is less than 2 hours away for your next dose. Do not use two doses at one time.  What happens if I overdose?  Seek emergency medical attention or call the Poison Help line at [phone].  What should I avoid while using mycophenolate mofetil?  Avoid driving or hazardous activity until you know how this medicine will affect you. Your reactions could be impaired.  You must not donate blood or sperm while using this medicine, and for at least 6 weeks (for blood) or 90 days (for sperm) after your last dose. Do not receive a live vaccine while using mycophenolate mofetil. The vaccine may not work as well and may not fully protect you from disease. Live vaccines include measles, mumps, rubella (MMR), rotavirus, typhoid, yellow fever, varicella (chickenpox), zoster (shingles), and nasal flu (influenza) vaccine.  Mycophenolate mofetil can make you sunburn more easily. Avoid sunlight or tanning beds. Wear protective clothing and use sunscreen (SPF 30 or higher) when you are outdoors.  What are the possible side effects of mycophenolate mofetil?  Get emergency medical help if you have signs of an allergic reaction: hives; difficult breathing; swelling of your face, lips, tongue, or throat.  Mycophenolate mofetil can affect your immune system, and may cause certain white blood cells to grow out of control. Call your doctor right away if you have:  ?? fever, swollen glands, painful mouth sores, cold or flu  symptoms, headache, ear pain;  ?? stomach pain, vomiting, diarrhea, weight loss;  ?? weakness on one side of your body, loss of muscle control;  ?? confusion, thinking problems, loss of interest in things that normally interest you;  ?? pain or burning when you urinate;  ?? tenderness around the transplanted kidney;  ?? swelling, warmth, redness, or oozing around a skin wound; or  ?? a new skin lesion, or a mole that has changed in size or color.  Also call your doctor at once if you have:  ?? bloody or tarry stools, coughing up blood or vomit that looks like coffee grounds;  ?? a blood infection (sepsis) --fever, flu symptoms, mouth and throat ulcers, rapid heart rate, shallow breathing; or  ?? low blood cell counts --fever, chills, tiredness, mouth sores, skin sores, easy bruising, unusual bleeding, pale skin, cold hands and feet, feeling light-headed or short of breath.  Common side effects may include:  ?? stomach pain, nausea, vomiting, diarrhea, constipation;  ?? swelling in your ankles or feet;  ?? rash; ?? headache, dizziness, tremors;  ?? fever, sore throat, cold symptoms, or other signs of infections;  ?? high blood sugar;  ?? abnormal blood tests;  ?? pain anywhere in your body;  ?? low blood cell counts; or  ?? increased blood pressure or heart rate.  This is not a complete list of side effects and others may occur. Call your doctor for medical advice about side effects. You may report side effects to FDA at 1-800-FDA-1088.  What other drugs will affect mycophenolate mofetil?  If you take sevelamer or an antacid, take your oral mycophenolate mofetil dose 2 hours before you take these other medicines.  Sometimes it is not safe to use certain medications at the same time. Some drugs can affect your blood levels of other drugs you take, which may increase side effects or make the medications less effective.  Tell your doctor about all your other medicines, especially:  ?? azathioprine;  ?? birth control pills or hormone replacement therapy;  ?? cholestyramine;  ?? antiviral medicine --acyclovir, ganciclovir, valacyclovir, valganciclovir;  ?? an antibiotic --amoxicillin, ciprofloxacin, metronidazole, norfloxacin, rifampin, sulfa drugs (SMX-TMP or SMZ-TMP, and others); or  ?? a stomach acid reducer --esomeprazole, lansoprazole, omeprazole, Nexium, Prevacid, Prilosec, Protonix, and others.  This list is not complete. Other drugs may affect mycophenolate mofetil, including prescription and over-the-counter medicines, vitamins, and herbal products. Not all possible drug interactions are listed here.  Where can I get more information?  Your doctor or pharmacist can provide more information about mycophenolate mofetil.  Remember, keep this and all other medicines out of the reach of children, never share your medicines with others, and use this medication only for the indication prescribed. Every effort has been made to ensure that the information provided by [employer]') is accurate, up-to-date, and complete, but no guarantee is made to that effect. Drug information contained herein may be time sensitive. [employer] information has been compiled for use by healthcare practitioners and consumers in the [place] and therefore [employer] does not warrant that uses outside of the [place] are appropriate, unless specifically indicated otherwise. [employer]'s drug information does not endorse drugs, diagnose patients or recommend therapy. [employer]'s drug information is an [employer], corporate to assist licensed healthcare practitioners in caring for their patients and/or to serve consumers viewing this service as a supplement to, and not a substitute for, the expertise, skill, knowledge and judgment of healthcare practitioners. The absence of  a warning for a given drug or drug combination in no way should be construed to indicate that the drug or drug combination is safe, effective or appropriate for any given patient. [employer] does not assume any responsibility for any aspect of healthcare administered with the aid of information [employer] provides. The information contained herein is not intended to cover all possible uses, directions, precautions, warnings, drug interactions, allergic reactions, or adverse effects. If you have questions about the drugs you are taking, check with your doctor, nurse or pharmacist.  Copyright [phone] [employer] Version: 10.01. Revision date: 06/16/2018.  Care instructions adapted under license by [facility]. If you have questions about a medical condition or this instruction, always ask your healthcare professional. [employer] disclaims any warranty or liability for your use of this information.

## 2019-03-12 NOTE — Unmapped (Signed)
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:  - Currently failing triamcinolone 0.1% and clobetasol ointment with intolerance to MTX, Imuran, Dupilumab  - Discussed that there are not many systemic options that remain - Cellcept (mycophenolate), could also consider plaquenil given sun sensitivity  -After lengthy discussion of risks associated with other systemic medication she agrees to attempt therapy with cellcept. Additional patient information provided.  -Start cellcept 500 mg BID pending normal baseline labs  -Continue to use triamcinolone ointment BID to the areas of rash PRN  - Use clobetasol ointment under occlusion to recalcitrant areas (like elbows); cotton gloves were provided today for hand occlusion  - Stop mirtazapine due to intolerance with associated grogginess  - Consider superimposed allergic contact dermatitis and patch testing in the future  - Consider rebiopsy if needed  - May consider light therapy if unable to tolerate cellcept    High risk medication use  - CBC; Future  - AST; Future  - ALT; Future  - BUN; Future  - Creatinine - Serum; Future    RTC 4 weeks for recheck        CC:  Rash    HPI:  Courtney Walker is a 78 y.o. female last seen 12/29/2018 by Dr. Orma Flaming for eczematous rash who presents today for follow up of the same.     At last visit, joint decision made to attempt therapy with topicals alone given intolerance of multiple systemics including Imuran, Dupilumab, and methotrexate. Since LV, notes that her rash has continued to be very itchy and continues to flare primarily on her arms and legs. Attempted use of mirtazapine as well but notes that even taking one half pill made her groggy for 3 days and thus she prefers to avoid that medication. She is open to trying other systemic agents given her continued severe itch.    Of note, considered light therapy at last visit. However, she does report that she is sensitive to the sun and gets sun burned easily. Medications tried:  - Imuran - stopped due to side effects of GI upset, fatigue and sensitive skin at 50mg  daily.  She was fairly well controlled at that time (had just completed a prednisone course) and we decided to just continue topical steroids.  - Xolair - for asthma x 10 years, stopped because she was starting dupixent and  because she thought the rash on her skin might be an allergy to [employer]. No flare of asthma since discontinuation.  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 No fevers, chills, headaches, joint pains or other skin concerns.     PE:  General: Well-developed, well-nourished female, in no acute distress.   Neuro: Alert and oriented, and answers questions appropriately.  Skin: Per  patient request, focused exam with inspection and palpation of the face, neck, bilateral upper extremities and bilateral lower extremities was performed today. Findings were normal with exception of the following:  - Erythematous, eczematous plaques on the elbows, forearms, palmar hands, and lower legs  - All other areas examined were normal or had no significant findings.    The patient was discussed with Dr. Orma Flaming who agrees with the assessment and plan as above.

## 2019-03-16 ENCOUNTER — Other Ambulatory Visit: Payer: Self-pay | Admitting: Internal Medicine

## 2019-03-19 ENCOUNTER — Ambulatory Visit: Payer: Medicare Other

## 2019-03-19 ENCOUNTER — Telehealth: Payer: Self-pay | Admitting: Internal Medicine

## 2019-03-19 MED ORDER — FLUTICASONE-SALMETEROL 250-50 MCG/DOSE IN AEPB: 1.0000 | INHALATION_SPRAY | Freq: Two times a day (BID) | RESPIRATORY_TRACT | 12 refills | Status: DC

## 2019-03-19 MED ORDER — MIRTAZAPINE 15 MG TABLET
ORAL_TABLET | 1 refills | 0 days | Status: CP
Start: 2019-03-19 — End: ?

## 2019-03-19 NOTE — Telephone Encounter (Signed)
Rx has been sent in per pt's request. Pt is aware. Nothing further is needed.

## 2019-03-27 DIAGNOSIS — Z79899 Other long term (current) drug therapy: Secondary | ICD-10-CM | POA: Diagnosis not present

## 2019-03-28 LAB — CBC
HEMOGLOBIN: 15.3 g/dL (ref 11.1–15.9)
MEAN CORPUSCULAR HEMOGLOBIN CONC: 33.1 g/dL (ref 31.5–35.7)
MEAN CORPUSCULAR HEMOGLOBIN: 30 pg (ref 26.6–33.0)
MEAN CORPUSCULAR VOLUME: 91 fL (ref 79–97)
WHITE BLOOD CELL COUNT: 6.9 10*3/uL (ref 3.4–10.8)

## 2019-03-28 LAB — WHITE BLOOD CELL COUNT: Leukocytes:NCnc:Pt:Bld:Qn:Automated count: 6.9

## 2019-03-28 LAB — AST (SGOT): Aspartate aminotransferase:CCnc:Pt:Ser/Plas:Qn:: 33

## 2019-03-28 LAB — GFR MDRD AF AMER: Glomerular filtration rate/1.73 sq M.predicted.black:ArVRat:Pt:Ser/Plas/Bld:Qn:Creatinine-based formula (CKD-EPI): 62

## 2019-03-28 LAB — CREATININE: CREATININE: 1 mg/dL (ref 0.57–1.00)

## 2019-03-28 LAB — BLOOD UREA NITROGEN: Urea nitrogen:MCnc:Pt:Ser/Plas:Qn:: 13

## 2019-03-28 LAB — ALT (SGPT): Alanine aminotransferase:CCnc:Pt:Ser/Plas:Qn:: 24

## 2019-03-29 MED ORDER — MYCOPHENOLATE MOFETIL 500 MG TABLET
ORAL_TABLET | Freq: Two times a day (BID) | ORAL | 0 refills | 30.00000 days | Status: CP
Start: 2019-03-29 — End: 2019-04-28

## 2019-03-29 MED ORDER — TRIAMCINOLONE ACETONIDE 0.1 % TOPICAL CREAM
Freq: Two times a day (BID) | TOPICAL | 3 refills | 0 days | Status: CP
Start: 2019-03-29 — End: 2020-03-28

## 2019-03-29 NOTE — Unmapped (Signed)
Addended by: Jed Limerick A on: 03/29/2019 02:17 PM     Modules accepted: Orders

## 2019-03-29 NOTE — Unmapped (Signed)
Courtney Walker was notified that all labs returned without concerning abnormalities. Will plan to start cellcept 500 mg BID and follow-up in about 2 weeks as previously planned.

## 2019-04-09 ENCOUNTER — Encounter
Admit: 2019-04-09 | Discharge: 2019-04-10 | Payer: MEDICARE | Attending: Student in an Organized Health Care Education/Training Program | Primary: Student in an Organized Health Care Education/Training Program

## 2019-04-09 DIAGNOSIS — L309 Dermatitis, unspecified: Principal | ICD-10-CM

## 2019-04-09 NOTE — Unmapped (Signed)
Dermatology Follow-up Note    A/P:    Eczematous dermatitis, improved:  - Feels that triamcinolone 0.1% cream mixed with cetaphil twice daily has provided significant improvement. As a result, advised to continue this regimen and stop the cellcept which she recently started only 11 days ago as this is unlikely to be providing significant benefit so early. She noted mild stomach upset with cellcept but otherwise tolerated the medication, so this is still an option for her if her current regimen proves to be inadequate in the future.  - Noted to have prior intolerance to MTX, Imuran, Dupilumab  - Consider superimposed allergic contact dermatitis and patch testing in the future  - Consider rebiopsy if needed  - May consider light therapy if able to obtain in future    RTC Return in about 4 months (around 08/07/2019) for Recheck.      CC:  Rash    HPI:  Courtney Walker is a 78 y.o. female last seen 03/12/2019 by Dr. Orma Flaming and myself for eczematous rash who presents today for follow up of the same. At [place], started cellcept given intractable rash and multiple prior failed therapies including Imuran, Dupilumab, and methotrexate. Since LV, notes that her rash has significantly improved with use of triamcinolone cream and Cetaphil moisturizer mixed together and applied twice daily. She notes that her itching is much better and that she is very happy with this improvement. Has been taking cellcept for the last 11 days and is unsure if this has provided any added benefit. Had some mild GI upset with the cellcept but otherwise tolerated this well. Denies any other skin concerns today.    Medications tried:  - Imuran - stopped due to side effects of GI upset, fatigue and sensitive skin at 50mg  daily.  She was fairly well controlled at that time (had just completed a prednisone course) and we decided to just continue topical steroids. - Xolair - for asthma x 10 years, stopped because she was starting dupixent and  because she thought the rash on her skin might be an allergy to [employer]. No flare of asthma since discontinuation.  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 No fevers, chills, headaches, joint pains or other skin concerns.     PE:  General: Well-developed, well-nourished female, in no acute distress.   Neuro: Alert and oriented, and answers questions appropriately.  Skin: Per patient request, focused exam with inspection and palpation of the face, neck, bilateral upper extremities and bilateral lower extremities was performed today. Findings were normal with exception of the following:  - Few scattered erythematous, eczematous plaques on the elbows and lower legs, improved from prior  - All other areas examined were normal or had no significant findings.    The patient was discussed  with Dr. Orma Flaming who agrees with the assessment and plan as above.

## 2019-04-09 NOTE — Unmapped (Signed)
You may stop the cellcept at this time given that your rash has improved on the cream alone.    Continue triamcinolone cream mixed with cetaphil moisturizer to the affected areas twice daily.

## 2019-04-24 DIAGNOSIS — L309 Dermatitis, unspecified: Principal | ICD-10-CM

## 2019-04-30 ENCOUNTER — Encounter: Payer: Self-pay | Admitting: Cardiology

## 2019-04-30 ENCOUNTER — Telehealth: Payer: Self-pay | Admitting: Internal Medicine

## 2019-04-30 ENCOUNTER — Other Ambulatory Visit: Payer: Self-pay

## 2019-04-30 ENCOUNTER — Ambulatory Visit: Payer: Medicare Other | Admitting: Cardiology

## 2019-04-30 VITALS — BP 124/70 | HR 68 | Ht 71.0 in | Wt 211.8 lb

## 2019-04-30 DIAGNOSIS — Z8673 Personal history of transient ischemic attack (TIA), and cerebral infarction without residual deficits: Secondary | ICD-10-CM

## 2019-04-30 DIAGNOSIS — I4892 Unspecified atrial flutter: Secondary | ICD-10-CM

## 2019-04-30 DIAGNOSIS — J4520 Mild intermittent asthma, uncomplicated: Secondary | ICD-10-CM | POA: Diagnosis not present

## 2019-04-30 DIAGNOSIS — Z7901 Long term (current) use of anticoagulants: Secondary | ICD-10-CM

## 2019-04-30 NOTE — Telephone Encounter (Signed)
Spoke with the pt and notified of recs per CDY  She verbalized understanding  Nothing further needed 

## 2019-04-30 NOTE — Patient Instructions (Signed)
Medication Instructions:  The current medical regimen is effective;  continue present plan and medications.  *If you need a refill on your cardiac medications before your next appointment, please call your pharmacy*  Follow-Up: At [facility], you and your health needs are our priority.  As part of our continuing mission to provide you with exceptional heart care, we have created designated Provider Care Teams.  These Care Teams include your primary Cardiologist (physician) and Advanced Practice Providers (APPs -  Physician Assistants and Nurse Practitioners) who all work together to provide you with the care you need, when you need it.  Your next appointment:   12 month(s)  The format for your next appointment:   In Person  Provider:   Mark Skains, MD   Thank you for choosing [facility]!!     

## 2019-04-30 NOTE — Progress Notes (Signed)
Cardiology Office Note:    Date:  04/30/2019   ID:  Laura Mendez, DOB 1941-03-07, MRN [id number]  PCP:  Hoyt Koch, MD  Cardiologist:  Candee Furbish, MD  Electrophysiologist:  None   Referring MD: Hoyt Koch, *     History of Present Illness:    Laura Mendez is a 78 y.o. female here for follow-up of atrial flutter.  She has been seen in the past by Roderic Palau, NP in the atrial fibrillation clinic.  This was diagnosed on 12/11/2015.    She auto converted after receiving 5 mg of metoprolol.  Asthma exacerbated.  Xarelto.  No major bleeding.  EF normal but left atrium was severely dilated 55 mm.    Cardizem was stopped because of rash and verapamil was started.  Methotrexate was started by dermatology.  Did not seem to be related to drugs.  Chads Vasc score is 5 including TIA prior left eye partial vision loss.  Also diagnosed with breast cancer.  04/30/2019-here for the follow-up of atrial fibrillation paroxysmal.  Overall doing quite well.  No fevers chills nausea vomiting syncope bleeding.  Covid precautions.  Been walking around the neighborhood.  No longer going to the gym because of Covid.  Feeling well.  Very rare palpitations occasionally.  No sustained episodes of arrhythmia.  Past Medical History:  Diagnosis Date  . Asthma   . Chronic airway obstruction, not elsewhere classified   . Diastolic dysfunction without heart failure   . Disorder of bone and cartilage, unspecified   . Dysrhythmia    A-Fib  . Family history of colon cancer   . Family history of kidney cancer   . Family history of ovarian cancer   . Paroxysmal atrial flutter ([place])   . Stroke [facility])    TIA  . TIA (transient ischemic attack)   . Unspecified asthma(493.90)     Past Surgical History:  Procedure Laterality Date  . ABDOMINAL HYSTERECTOMY    . BREAST LUMPECTOMY Right 12/30/2017  . BREAST LUMPECTOMY WITH RADIOACTIVE SEED LOCALIZATION Right 12/30/2017   Procedure: BREAST LUMPECTOMY WITH RADIOACTIVE SEED LOCALIZATION X'S 3;  Surgeon: Rolm Bookbinder, MD;  Location: [facility];  Service: General;  Laterality: Right;  . BUNIONECTOMY Bilateral   . EYE SURGERY Bilateral    cataract removal    Current Medications: Current Meds  Medication Sig  . albuterol (VENTOLIN HFA) 108 (90 Base) MCG/ACT inhaler TAKE 2 PUFFS BY MOUTH EVERY 4 HOURS AS NEEDED  . cholecalciferol (VITAMIN D) 1000 units tablet Take 1,000 Units by mouth daily.  . clobetasol ointment (TEMOVATE) AB-123456789 % Apply 1 application topically 2 (two) times daily as needed (for skin irritation).  . fish oil-omega-3 fatty acids 1000 MG capsule Take 1 g by mouth daily.   . Fluticasone-Salmeterol (WIXELA INHUB) 250-50 MCG/DOSE AEPB Inhale 1 puff into the lungs 2 (two) times daily.  Marland Kitchen ipratropium-albuterol (DUONEB) 0.5-2.5 (3) MG/3ML SOLN Take 3 mLs by nebulization every 6 (six) hours as needed.  Marland Kitchen letrozole (FEMARA) 2.5 MG tablet Take 1 tablet (2.5 mg total) by mouth daily.  Marland Kitchen MEGARED OMEGA-3 KRILL OIL 500 MG CAPS Take 500 mg by mouth daily.   . Multiple Vitamin (MULTIVITAMIN) tablet Take 1 tablet by mouth daily.   . rivaroxaban (XARELTO) 20 MG TABS tablet TAKE 1 TABLET BY MOUTH EVERY DAY WITH SUPPER  . traMADol (ULTRAM) 50 MG tablet Take 1 tablet (50 mg total) by mouth every 6 (six) hours as needed.  . triamcinolone ointment (  KENALOG) 0.1 % Apply 1 application topically 2 (two) times daily as needed (for itching).   Current Facility-Administered Medications for the 04/30/19 encounter (Office Visit) with Jerline Pain, MD  Medication  . Mepolizumab SOLR 100 mg     Allergies:   Clarithromycin, Ibuprofen, Codeine, Codeine phosphate, Diltiazem, Tape, Tapentadol, and Verapamil hcl er   Social History   Socioeconomic History  . Marital status: Divorced    Spouse name: Not on file  . Number of children: Not on file  . Years of education: Not on file  . Highest education level: Not on file   Occupational History  . Occupation: retired  Scientific laboratory technician  . Financial resource strain: Not on file  . Food insecurity    Worry: Not on file    Inability: Not on file  . Transportation needs    Medical: Not on file    Non-medical: Not on file  Tobacco Use  . Smoking status: Former Smoker    Years: 0.00    Types: Cigarettes    Quit date: 05/24/1977    Years since quitting: 41.9  . Smokeless tobacco: Never Used  . Tobacco comment: 7/7 pt states she occasionally smoked, let cigs "burn" more than she smoked them  Substance and Sexual Activity  . Alcohol use: No  . Drug use: No  . Sexual activity: Not on file  Lifestyle  . Physical activity    Days per week: Not on file    Minutes per session: Not on file  . Stress: Not on file  Relationships  . Social Herbalist on phone: Not on file    Gets together: Not on file    Attends religious service: Not on file    Active member of club or organization: Not on file    Attends meetings of clubs or organizations: Not on file    Relationship status: Not on file  Other Topics Concern  . Not on file  Social History Narrative  . Not on file     Family History: The patient's family history includes COPD in her father; Cancer in an other family member; Cirrhosis in her sister; Colon cancer in her cousin, maternal uncle, and maternal uncle; Colon cancer (age of onset: 75) in her mother; Heart attack in her paternal uncle; Heart disease in her maternal grandfather; Kidney cancer (age of onset: 3) in her sister; Kidney cancer (age of onset: 18) in her brother; Lung cancer in her sister; Melanoma (age of onset: 34) in her sister; Ovarian cancer in her maternal aunt and maternal grandmother; Ovarian cancer (age of onset: 20) in her sister; Parkinson's disease in her brother; Rectal cancer in her paternal grandfather. There is no history of Breast cancer.  ROS:   Please see the history of present illness.    all other systems reviewed  and are negative.  EKGs/Labs/Other Studies Reviewed:    The following studies were reviewed today:  Echo 2017-Left ventricle: The cavity size was mildly dilated. Wall thickness was increased in a pattern of mild LVH. Systolic function was normal. The estimated ejection fraction was in the range of 55% to 60%. Wall motion was normal; there were no regional wall motion abnormalities. Doppler parameters are consistent with abnormal left ventricular relaxation (grade 1 diastolic dysfunction). Doppler parameters are consistent with high ventricular filling pressure. - Mitral valve: Severely calcified annulus. There was mild regurgitation. Valve area by continuity equation (using LVOT flow): 2.36 cm^2. - Left atrium: The atrium  was severely dilated. - Right ventricle: The cavity size was normal. Wall thickness was normal. Systolic function was normal. - Right atrium: The atrium was mildly dilated. - Atrial septum: No defect or patent foramen ovale was identified by color flow Doppler. - Tricuspid valve: There was trivial regurgitation. - Pulmonary arteries: Systolic pressure was within the normal range. PA peak pressure: 24 mm Hg (S).   EKG:    04/30/2019-sinus rhythm 68 with PAC with no other significant abnormalities.  Prior from 12/20/2017 shows sinus rhythm without any other significant abnormalities.  Nonspecific T wave changes.  Recent Labs: 02/26/2019: ALT 19; BUN 20; Creatinine, Ser 0.97; Hemoglobin 15.2; Platelets 252.0; Potassium 5.3; Sodium 143; TSH 2.28  Recent Lipid Panel    Component Value Date/Time   CHOL 187 02/26/2019 0859   TRIG 235.0 (H) 02/26/2019 0859   TRIG 183 (H) 03/04/2006 1034   HDL 39.10 02/26/2019 0859   CHOLHDL 5 02/26/2019 0859   VLDL 47.0 (H) 02/26/2019 0859   LDLCALC 129 (H) 01/13/2016 0922   LDLDIRECT 115.0 02/26/2019 0859    Physical Exam:    VS:  BP 124/70   Pulse 68   Ht 5\' 11"  (1.803 m)   Wt 211 lb 12.8 oz (96.1  kg)   SpO2 94%   BMI 29.54 kg/m     Wt Readings from Last 3 Encounters:  04/30/19 211 lb 12.8 oz (96.1 kg)  02/26/19 211 lb (95.7 kg)  11/28/18 207 lb 9.6 oz (94.2 kg)    GEN: Well nourished, well developed, in no acute distress  HEENT: normal  Neck: no JVD, carotid bruits, or masses Cardiac: RRR; no murmurs, rubs, or gallops,no edema  Respiratory:  clear to auscultation bilaterally, normal work of breathing GI: soft, nontender, nondistended, + BS MS: no deformity or atrophy  Skin: warm and dry, no rash Neuro:  Alert and Oriented x 3, Strength and sensation are intact Psych: euthymic mood, full affect   ASSESSMENT:    1. Paroxysmal atrial flutter ([place])   2. Chronic anticoagulation   3. History of TIA (transient ischemic attack)   4. Mild intermittent chronic asthma without complication    PLAN:    In order of problems listed above:  Typical atrial flutter paroxysmal -Episode 2017 with auto conversion. -If returns, ablative therapy would be helpful since it is typical in appearance.  Chronic anticoagulation - She used to take Plavix and aspirin because of prior TIA/thrombosis to left eye.  Continue with Xarelto 20 mg a day.  She has a friend who works for [employer] and she was asking about the differences.  After discussing, she will continue with Xarelto.  No bleeding.  Doing well.  Hemoglobin being monitored.  15 last check, creatinine 0.97.  Cost quite high in the donut hole.  Chronic asthma - Inhalers noted.  Dr. Annamaria Boots.  Medications reviewed.  May have been a trigger for the atrial flutter.  1 year follow-up   Medication Adjustments/Labs and Tests Ordered: Current medicines are reviewed at length with the patient today.  Concerns regarding medicines are outlined above.  Orders Placed This Encounter  Procedures  . EKG 12-Lead   No orders of the defined types were placed in this encounter.   Patient Instructions  Medication Instructions:  The current medical  regimen is effective;  continue present plan and medications.  *If you need a refill on your cardiac medications before your next appointment, please call your pharmacy*  Follow-Up: At [facility], you and your health needs  are our priority.  As part of our continuing mission to provide you with exceptional heart care, we have created designated Provider Care Teams.  These Care Teams include your primary Cardiologist (physician) and Advanced Practice Providers (APPs -  Physician Assistants and Nurse Practitioners) who all work together to provide you with the care you need, when you need it.  Your next appointment:   12 month(s)  The format for your next appointment:   In Person  Provider:   Candee Furbish, MD  Thank you for choosing [facility]!!        Signed, Candee Furbish, MD  04/30/2019 9:10 AM    [place]

## 2019-04-30 NOTE — Telephone Encounter (Signed)
Patient is requesting Laura Mendez's recommendations re: covid vaccines when they become available. Laura Mendez please advise.  Thanks!

## 2019-04-30 NOTE — Telephone Encounter (Signed)
Laura Mendez should be a candidate, as I plan to be. We don't know when a vaccine will actually be available.

## 2019-05-31 ENCOUNTER — Ambulatory Visit: Payer: Medicare Other | Admitting: Internal Medicine

## 2019-05-31 ENCOUNTER — Encounter: Payer: Self-pay | Admitting: Internal Medicine

## 2019-05-31 ENCOUNTER — Other Ambulatory Visit: Payer: Self-pay

## 2019-05-31 DIAGNOSIS — J302 Other seasonal allergic rhinitis: Secondary | ICD-10-CM

## 2019-05-31 DIAGNOSIS — J449 Chronic obstructive pulmonary disease, unspecified: Secondary | ICD-10-CM

## 2019-05-31 DIAGNOSIS — J3089 Other allergic rhinitis: Secondary | ICD-10-CM

## 2019-05-31 NOTE — Patient Instructions (Signed)
We can continue current meds  Please call if we can help 

## 2019-05-31 NOTE — Progress Notes (Signed)
Patient ID: Laura Mendez, female    DOB: [birth year], 79 y.o.   MRN: [id number]  HPI F former smoker followed for Severe asthma/ COPD FEV1 [id number],  complicated by allergic rhinitis,  paroxysmal A. Fib Labs 11/23/16- allergy profile total IgE 409 on Xolair, elevated specific IgE for dust mite and cockroach. EOS 3,300, Nl ANA and alpha-gal Derm Path report reviewed- Spongiotic/ eczema process- does not describe it as urticaria. Xolair was changed to [facility] June, 2018, to see if Xolair was causing her rash-no improvement after 5 months Office Spirometry 12/20/2017-very severe obstructive airways disease.  FVC 2.09/60%, FEV1 0.90/34%, ratio 0.43 Quantiferon tb Gold 06/22/17- Neg ---------------------------------------------------------------.   11/28/2018- 79 year old female former smoker followed for severe asthma/COPD/Nucala, complicated by allergic rhinitis, paroxysmal A. Fib/ Xarelto, rash, breast cancer R,  Xolair was changed to Dupixent at request of her dermatologist, for eczema/ rash, with coverage also for her asthma/COPD. Now both are stopped and she feels stable with mild chronic dry cough typical for time of year. ------pt states her breathing is at baseline, has a cough every now and then, allergies d/t allergy season; using Wixela daily & proventil as needed Prednisone taper for viral exacerb during the winter.  Covid isolating at home. Using rescue 3x/ day.  05/31/19- 79 year old female former smoker followed for severe asthma/COPD/Nucala, complicated by allergic rhinitis, paroxysmal A. Fib/ Xarelto, rash, breast cancer R,  Xarelto,  neb Duoneb, Wixela 250, ventolin hfa More runny nose/ pndrip in cold weather. Blames this for tighter chest- used rescue x 3 yesterday, but not needing nebulizer. Continues Wixela. Feels stable over time off Biologics. Has stayed in house since March. No new concerns.  CXR 11/28/2018-  The chest is hyperexpanded with attenuation of the pulmonary vasculature.  Scar in the left lung base is unchanged. No consolidative process, pneumothorax or effusion. Heart size is normal. Aortic atherosclerosis is noted. No acute or focal bony abnormality. IMPRESSION: No acute disease.  Review of Systems-see HPI   + = positive Constitutional:   No-   weight loss, night sweats, fevers, chills, fatigue, lassitude. HEENT:   No-  headaches, difficulty swallowing, tooth/dental problems, sore throat,       No-  sneezing, +itching, ear ache,  nasal congestion, +post nasal drip,  CV:  No-   chest pain, orthopnea, PND, swelling in lower extremities, anasarca, dizziness, palpitations Resp:  + shortness of breath with exertion or at rest- unchanged             No-   productive cough,  + non-productive cough,  No-  coughing up of blood.              No-   change in color of mucus.  + wheezing.   Skin:  +rash or lesions. GI:  No-   heartburn, indigestion, abdominal pain, nausea, vomiting, GU:  MS:  +   joint pain or swelling.   Neuro- nothing unusual  Psych:  No- change in mood or affect. No depression or anxiety.  No memory loss  Objective:     General- Alert, Oriented, Affect-appropriate, Distress- none acute, Pleasant, + Overweight Skin- + numerous unremarkable keratoses on her back with mild diffuse erythema suggesting excoriation. Lymphadenopathy- none Head- atraumatic            Eyes- Gross vision intact, PERRLA, conjunctivae clear secretions            Ears- Hearing, canals normal            Nose-  Mild turbinate edema, No-Septal dev, mucus, polyps, erosion, perforation             Throat- Mallampati II , mucosa -not red, drainage- none, tonsils- atrophic Neck- flexible , trachea midline, no stridor , thyroid nl, carotid no bruit Chest - symmetrical excursion , unlabored           Heart/CV- RRR +, no murmur , no gallop  , no rub, nl s1 s2                           - JVD- none , edema- none, stasis changes- none, varices- none           Lung- +clear to P&A/  distant/unlabored, wheeze- none,  cough+ minimal, dullness-none, rub- none           Chest wall-  Abd-  Br/ Gen/ Rectal- Not done, not indicated Extrem- cyanosis- none, clubbing, none, atrophy- none, strength- nl Neuro- grossly intact to observation

## 2019-05-31 NOTE — Assessment & Plan Note (Signed)
Increased rhinorhea now likely reflects vasomotor process and indoor heat rather than allergy. Ok to use saline or flonase nasal sprays as discussed.

## 2019-05-31 NOTE — Assessment & Plan Note (Signed)
Never a heavy smoker. Stable off Biologics. Meds appropriate. Discussed Covid vaccine.

## 2019-06-19 DIAGNOSIS — Z23 Encounter for immunization: Secondary | ICD-10-CM | POA: Diagnosis not present

## 2019-06-23 ENCOUNTER — Ambulatory Visit: Payer: Medicare Other

## 2019-07-10 ENCOUNTER — Other Ambulatory Visit: Payer: Self-pay | Admitting: Internal Medicine

## 2019-07-25 NOTE — Progress Notes (Signed)
Patient Care Team: Hoyt Koch, MD as PCP - General (Internal Medicine) Jerline Pain, MD as PCP - Cardiology (Cardiology) Rolm Bookbinder, MD as Consulting Physician (General Surgery) Nicholas Lose, MD as Consulting Physician (Hematology and Oncology) Gery Pray, MD as Consulting Physician (Radiation Oncology)  DIAGNOSIS:    ICD-10-CM   1. Malignant neoplasm of lower-outer quadrant of right breast of female, estrogen receptor positive ([place])  C50.511    Z17.0     SUMMARY OF ONCOLOGIC HISTORY: Oncology History  Malignant neoplasm of lower-outer quadrant of right breast of female, estrogen receptor positive ([place])  11/10/2017 Initial Diagnosis   Six-month follow-up of right breast masses: Anterior mass unchanged and posterior mass showed spiculation.  Biopsy attempt was made on posterior mass but accidentally the anterior mass was actually biopsied.  It revealed grade 1 invasive ductal carcinoma ER 100%, PR 100%, Ki-67 1%, HER-2 negative ratio 1.77; second attempt to biopsy the posterior mass resulted in the biopsy of an intermediate zone between the 2 masses that revealed ADH and ALH, (posterior mass still needs to be biopsied) T1 a N0 stage I a clinical stage   12/30/2017 Surgery   Right lumpectomy: IDC grade 1, 0.6 cm, ADH, ER 100%, PR 100%, HER-2 negative ratio 1.77, Ki-67 1%, margins negative.  Lymph nodes not evaluated.  T1b N0 stage Ia   12/30/2017 Surgery   Right lumpectomy: IDC with calcifications grade 1, 0.6 cm, ADH, margins negative, ER 100%, PR 90%, HER-2 negative ratio 1.77, Ki-67 1%, T1 be N0 stage Ia    01/05/2018 Genetic Testing   Negative genetic testing on the multicancer panel.  The Multi-Gene Panel offered by [facility] includes sequencing and/or deletion duplication testing of the following 84 genes: AIP, ALK, APC, ATM, AXIN2,BAP1,  BARD1, BLM, BMPR1A, BRCA1, BRCA2, BRIP1, CASR, CDC73, CDH1, CDK4, CDKN1B, CDKN1C, CDKN2A (p14ARF), CDKN2A (p16INK4a), CEBPA,  CHEK2, CTNNA1, DICER1, DIS3L2, EGFR (c.2369C>T, p.Thr790Met variant only), EPCAM (Deletion/duplication testing only), FH, FLCN, GATA2, GPC3, GREM1 (Promoter region deletion/duplication testing only), HOXB13 (c.251G>A, p.Gly84Glu), HRAS, KIT, MAX, MEN1, MET, MITF (c.952G>A, p.Glu318Lys variant only), MLH1, MSH2, MSH3, MSH6, MUTYH, NBN, NF1, NF2, NTHL1, PALB2, PDGFRA, PHOX2B, PMS2, POLD1, POLE, POT1, PRKAR1A, PTCH1, PTEN, RAD50, RAD51C, RAD51D, RB1, RECQL4, RET, RUNX1, SDHAF2, SDHA (sequence changes only), SDHB, SDHC, SDHD, SMAD4, SMARCA4, SMARCB1, SMARCE1, STK11, SUFU, TERC, TERT, TMEM127, TP53, TSC1, TSC2, VHL, WRN and WT1.  The report date is January 05, 2018.   01/11/2018 Cancer Staging   Staging form: Breast, AJCC 8th Edition - Pathologic: Stage IA (pT1b, pN0, cM0, G1, ER+, PR+, HER2-) - Signed by Gardenia Phlegm, NP on 01/11/2018   01/12/2018 -  Anti-estrogen oral therapy   Letrozole 2.5 mg daily     CHIEF COMPLIANT: Follow-up of right breast cancer letrozole therapy  INTERVAL HISTORY: Laura Mendez is a 79 y.o. with above-mentioned history of right breast cancer who underwent a lumpectomy and is currently on anti-estrogen therapy with letrozole. Mammogram on 11/23/18 showed no evidence of malignancy bilaterally. She presents to the clinic today for annual follow-up.  She gets a little bit of heartburn after taking the medication but otherwise she is doing quite well.  ALLERGIES:  is allergic to clarithromycin; ibuprofen; codeine; codeine phosphate; diltiazem; tape; tapentadol; and verapamil hcl er.  MEDICATIONS:  Current Outpatient Medications  Medication Sig Dispense Refill  . albuterol (VENTOLIN HFA) 108 (90 Base) MCG/ACT inhaler INHALE 2 PUFFS BY MOUTH EVERY 4 HOURS AS NEEDED 18 g 3  . cholecalciferol (VITAMIN D) 1000 units  tablet Take 1,000 Units by mouth daily.    . clobetasol ointment (TEMOVATE) 2.72 % Apply 1 application topically 2 (two) times daily as needed (for skin  irritation).    . fish oil-omega-3 fatty acids 1000 MG capsule Take 1 g by mouth daily.     . Fluticasone-Salmeterol (WIXELA INHUB) 250-50 MCG/DOSE AEPB Inhale 1 puff into the lungs 2 (two) times daily. 60 each 12  . ipratropium-albuterol (DUONEB) 0.5-2.5 (3) MG/3ML SOLN Take 3 mLs by nebulization every 6 (six) hours as needed. 75 mL 12  . letrozole (FEMARA) 2.5 MG tablet Take 1 tablet (2.5 mg total) by mouth daily. 90 tablet 3  . MEGARED OMEGA-3 KRILL OIL 500 MG CAPS Take 500 mg by mouth daily.     . Multiple Vitamin (MULTIVITAMIN) tablet Take 1 tablet by mouth daily.     . rivaroxaban (XARELTO) 20 MG TABS tablet TAKE 1 TABLET BY MOUTH EVERY DAY WITH SUPPER 90 tablet 2  . triamcinolone ointment (KENALOG) 0.1 % Apply 1 application topically 2 (two) times daily as needed (for itching).     Current Facility-Administered Medications  Medication Dose Route Frequency Provider Last Rate Last Admin  . Mepolizumab SOLR 100 mg  100 mg Subcutaneous Q28 days Baird Lyons D, MD   100 mg at 12/20/17 1455    PHYSICAL EXAMINATION: ECOG PERFORMANCE STATUS: 1 - Symptomatic but completely ambulatory  Vitals:   07/26/19 0950  BP: 122/73  Pulse: 71  Resp: 18  Temp: 98.2 F (36.8 C)  SpO2: 96%   Filed Weights   07/26/19 0950  Weight: 211 lb 8 oz (95.9 kg)    BREAST: No palpable masses or nodules in either right or left breasts. No palpable axillary supraclavicular or infraclavicular adenopathy no breast tenderness or nipple discharge. (exam performed in the presence of a chaperone)  LABORATORY DATA:  I have reviewed the data as listed CMP Latest Ref Rng & Units 02/26/2019 12/22/2017 11/16/2017  Glucose 70 - 99 mg/dL 99 89 90  BUN 6 - 23 mg/dL 20 10 14   Creatinine 0.40 - 1.20 mg/dL 0.97 0.81 0.96  Sodium 135 - 145 mEq/L 143 141 141  Potassium 3.5 - 5.1 mEq/L 5.3(H) 3.9 4.0  Chloride 96 - 112 mEq/L 106 105 104  CO2 19 - 32 mEq/L 31 25 27   Calcium 8.4 - 10.5 mg/dL 10.4 10.0 10.2  Total Protein 6.0  - 8.3 g/dL 7.5 - 7.7  Total Bilirubin 0.2 - 1.2 mg/dL 0.6 - 0.5  Alkaline Phos 39 - 117 U/L 72 - 78  AST 0 - 37 U/L 26 - 27  ALT 0 - 35 U/L 19 - 23    Lab Results  Component Value Date   WBC 6.2 02/26/2019   HGB 15.2 (H) 02/26/2019   HCT 45.9 02/26/2019   MCV 92.1 02/26/2019   PLT 252.0 02/26/2019   NEUTROABS 5.5 11/16/2017    ASSESSMENT & PLAN:  Malignant neoplasm of lower-outer quadrant of right breast of female, estrogen receptor positive ([place]) 12/30/2017:Right lumpectomy: IDC with calcifications grade 1, 0.6 cm, ADH, margins negative, ER 100%, PR 90%, HER-2 negative ratio 1.77, Ki-67 1%, T1 be N0 stage Ia Did not need adjuvant radiation because of favorable tumor characteristics and age over 28  Current treatment: Letrozole 2.5 mg daily started August 2019 Letrozole toxicities: Denies any adverse effects letrozole.  Breast cancer surveillance: 1.  Breast exam 07/26/2019: Benign 2.  Mammogram: 11/23/2018: Benign breast density category B  Return to clinic in 1 year  for follow-up    No orders of the defined types were placed in this encounter.  The patient has a good understanding of the overall plan. she agrees with it. she will call with any problems that may develop before the next visit here.  Total time spent: 20 mins including face to face time and time spent for planning, charting and coordination of care  Nicholas Lose, MD 07/26/2019  I, Cloyde Reams Dorshimer, am acting as scribe for Dr. Nicholas Lose.  I have reviewed the above documentation for accuracy and completeness, and I agree with the above.

## 2019-07-26 ENCOUNTER — Inpatient Hospital Stay: Payer: Medicare Other | Attending: Hematology and Oncology | Admitting: Hematology and Oncology

## 2019-07-26 ENCOUNTER — Other Ambulatory Visit: Payer: Self-pay

## 2019-07-26 DIAGNOSIS — Z79899 Other long term (current) drug therapy: Secondary | ICD-10-CM | POA: Diagnosis not present

## 2019-07-26 DIAGNOSIS — Z170 Estrogen receptor positive status [ER+]: Secondary | ICD-10-CM | POA: Diagnosis not present

## 2019-07-26 DIAGNOSIS — Z79811 Long term (current) use of aromatase inhibitors: Secondary | ICD-10-CM | POA: Insufficient documentation

## 2019-07-26 DIAGNOSIS — C50511 Malignant neoplasm of lower-outer quadrant of right female breast: Secondary | ICD-10-CM | POA: Diagnosis not present

## 2019-07-26 DIAGNOSIS — Z7951 Long term (current) use of inhaled steroids: Secondary | ICD-10-CM | POA: Insufficient documentation

## 2019-07-26 DIAGNOSIS — Z7901 Long term (current) use of anticoagulants: Secondary | ICD-10-CM | POA: Insufficient documentation

## 2019-07-26 MED ORDER — LETROZOLE 2.5 MG PO TABS: 2.5000 mg | ORAL_TABLET | Freq: Every day | ORAL | 3 refills | Status: DC

## 2019-07-26 NOTE — Assessment & Plan Note (Signed)
12/30/2017:Right lumpectomy: IDC with calcifications grade 1, 0.6 cm, ADH, margins negative, ER 100%, PR 90%, HER-2 negative ratio 1.77, Ki-67 1%, T1 be N0 stage Ia Did not need adjuvant radiation because of favorable tumor characteristics and age over 14  Current treatment: Letrozole 2.5 mg daily started August 2019 Letrozole toxicities: Denies any adverse effects letrozole.  Breast cancer surveillance: 1.  Breast exam 07/26/2019: Benign 2.  Mammogram: 11/23/2018: Benign breast density category B  Return to clinic in 1 year for follow-up

## 2019-07-27 ENCOUNTER — Telehealth: Payer: Self-pay | Admitting: Hematology and Oncology

## 2019-07-27 NOTE — Telephone Encounter (Signed)
I left a message regarding schedule  

## 2019-08-07 DIAGNOSIS — M25561 Pain in right knee: Secondary | ICD-10-CM | POA: Insufficient documentation

## 2019-08-10 ENCOUNTER — Encounter: Admit: 2019-08-10 | Discharge: 2019-08-11 | Payer: MEDICARE

## 2019-08-10 DIAGNOSIS — L820 Inflamed seborrheic keratosis: Principal | ICD-10-CM

## 2019-08-10 DIAGNOSIS — L309 Dermatitis, unspecified: Principal | ICD-10-CM

## 2019-08-10 DIAGNOSIS — L308 Other specified dermatitis: Secondary | ICD-10-CM | POA: Diagnosis not present

## 2019-08-10 NOTE — Unmapped (Signed)
Cryosurgery  Cryosurgery (“freezing”) uses liquid nitrogen to destroy certain types of skin lesions. Lowering the temperature of the lesion in a small area surrounding skin destroys the lesion. Immediately following cryosurgery, you will notice redness and swelling of the treatment area. Blistering or weeping may occur, lasting approximately one week which will then be followed by crusting. Most areas will heal completely in 10 to 14 days.    Wash the treated areas daily. Allow soap and water to run over the areas, but do not scrub. Should a scab or crust form, allow it to fall off on its own. Do not remove or pick at it. Application of an ointment  and a bandage may make you feel more comfortable, but it is not necessary. Some people develop an allergy to Neosporin, so we recommend that Vaseline or  Aquaphor be used.    The cryotherapy site will be more sensitive than your surrounding skin. Keep it covered, and remember to apply sunscreen every day to all your sun exposed skin. A scar may remain which is lighter or pinker than your normal skin. Your body will continue to improve your scar for up to one year; however a light-colored scar may remain.    Infection following cryotherapy is rare. However if you are worried about the appearance of the treated area, contact your doctor. We have a physician on call at all times. If you have any concerns about the site, please call our clinic at [phone]

## 2019-08-10 NOTE — Unmapped (Signed)
Dermatology Note     Assessment and Plan:      Eczematous dermatitis, chronic illness with continued disease activity:  -Continued disease activity over elbows and left shin  -Patient medication:continue triamcinolone 0.1% cream mixed with Eucerin twice daily to active areas  -CellCept remains an option if this treatment fails  -Diagnosis, treatment options, prognosis, risk/ benefit, and side effects were discussed with the patient     Inflamed seborrheic keratosis, right neck:  -Given inflammation on exam today treatment is medically necessary and cryotherapy was offered which patient agreed  After verbal informed consent discussing the risks and benefits of the procedure including scarring, pigment alteration, recurrence, or persistence of the lesion, cryotherapy using liquid nitrogen was performed. Number treated: 1    The patient was advised to call for an appointment should any new, changing ,or symptomatic lesions or a flare develop.     RTC: Return in about 6 months (around 02/10/2020) for Recheck. or sooner as needed   _____________________________________________________________________________________    Referring Provider: Kipp Brood,*     Chief Complaint     Chief Complaint   Patient presents with   ??? Eczema     flare-ups throughout body, using triamcinolone crm/ cetapphil as directed       HPI     Courtney Walker is a pleasant 79 y.o. female, who presents as a returning patient (last seen 04/09/2019) to  [facility] for follow up of  eczematous dermatitis.      At last visit she felt that the triamcinolone 0.1% cream mixed with cetaphil was very helpful.  She stopped Cellcept (had only started 11 days prior).  She noted mild stomach upset with cellcept, so this is still an option if needed.      Today patient reports she is flaring up all over her body and using triamcinolone and cetaphil cream.  This treatment is helping and she uses it twice daily to active areas.  She only has active itching areas today on her bilateral elbows and left shin, but her disease activity is very manageable.    She also brings up a lesion on her right neck is been present for months and is quite irritated getting caught on things and painful.  No treatments tried.    Pertinent history (data extracted and summarized from prior notes for review purposes only):   - Imuran - stopped due to side effects of GI upset, fatigue and sensitive skin at 50mg  daily.  She was fairly well controlled at that time (had just completed a prednisone course) and we decided to just continue topical steroids.  - Xolair - for asthma x 10 years, stopped because she was starting dupixent and  because she thought the rash on her skin might be an allergy to [employer]. No flare of asthma since discontinuation.  - Methotrexate -improved on this medication, but stopped 07/2017 due to fatigue and other side effects  - Dupilumab - helped dermatitis, but she got hives and tongue issues and watery eyes/vision and unable to give herself the shots  - Prednisone -responsive to prednisone, but she understands the long-term risk associated with continued use    The patient denies any other new/changing lesions or other issues of concern.     Pertinent Past Medical History     No history of skin cancer    Past Medical History, Family History, Social History , Med List, Allergies, Problem List reviewed in the rooming section of Epic  ROS: Other than symptoms mentioned in the HPI,   No fevers, chills, or other skin complaints.    Physical Examination     General: Well-appearing female, in no acute distress, resting comfortably  Neuro: Alert and oriented, answers questions appropriately  Focal Skin Exam: Per patient request, Focal examination of face, neck bilateral upper and lower extremities performed.  Skin examination was notable for the following:  Seborrheic Keratosis: A stuck-on appearing keratotic papule located On the right neck    with irritation (redness, crusting, edema, and /or partial avulsion   -Eczematous plaques on the bilateral elbows and left shin  All areas not commented on are within normal limits or unremarkable.

## 2019-08-16 DIAGNOSIS — M1711 Unilateral primary osteoarthritis, right knee: Secondary | ICD-10-CM | POA: Diagnosis not present

## 2019-08-16 DIAGNOSIS — M170 Bilateral primary osteoarthritis of knee: Secondary | ICD-10-CM | POA: Diagnosis not present

## 2019-08-16 DIAGNOSIS — M25561 Pain in right knee: Secondary | ICD-10-CM | POA: Diagnosis not present

## 2019-09-04 DIAGNOSIS — R519 Headache, unspecified: Secondary | ICD-10-CM | POA: Diagnosis not present

## 2019-09-21 DIAGNOSIS — M79671 Pain in right foot: Secondary | ICD-10-CM | POA: Diagnosis not present

## 2019-10-15 ENCOUNTER — Other Ambulatory Visit: Payer: Self-pay | Admitting: Hematology and Oncology

## 2019-10-15 DIAGNOSIS — Z853 Personal history of malignant neoplasm of breast: Secondary | ICD-10-CM

## 2019-10-15 DIAGNOSIS — Z9889 Other specified postprocedural states: Secondary | ICD-10-CM

## 2019-11-20 ENCOUNTER — Other Ambulatory Visit: Payer: Self-pay | Admitting: Cardiology

## 2019-11-20 NOTE — Telephone Encounter (Signed)
Xarelto 20mg  refill request received. Pt is 79 years old, weight-95.9kg, Crea-0.97 on 02/26/2019, last seen by Dr. Marlou Porch on 04/30/2019, Diagnosis-Afib, CrCl-83.59ml/min; Dose is appropriate based on dosing criteria. Will send in refill to requested pharmacy.

## 2019-11-27 ENCOUNTER — Other Ambulatory Visit: Payer: Self-pay

## 2019-11-27 ENCOUNTER — Ambulatory Visit
Admission: RE | Admit: 2019-11-27 | Discharge: 2019-11-27 | Disposition: A | Discharge status: Home / Self Care | Payer: Medicare Other | Source: Ambulatory Visit | Attending: Hematology and Oncology | Admitting: Hematology and Oncology

## 2019-11-27 DIAGNOSIS — R928 Other abnormal and inconclusive findings on diagnostic imaging of breast: Secondary | ICD-10-CM | POA: Diagnosis not present

## 2019-11-27 DIAGNOSIS — Z9889 Other specified postprocedural states: Secondary | ICD-10-CM

## 2019-11-27 DIAGNOSIS — Z853 Personal history of malignant neoplasm of breast: Secondary | ICD-10-CM

## 2019-11-27 IMAGING — MG DIGITAL DIAGNOSTIC BILAT W/ TOMO W/ CAD
6 of 9 series · 6 of 21 positions shown · non-contrast
Comparison: Previous exam(s).

CLINICAL DATA: 79-year-old female status post malignant right
lumpectomy in [9G]. No current issues.

EXAM:
DIGITAL DIAGNOSTIC BILATERAL MAMMOGRAM WITH TOMO AND CAD

[R CC (1 of 2)]
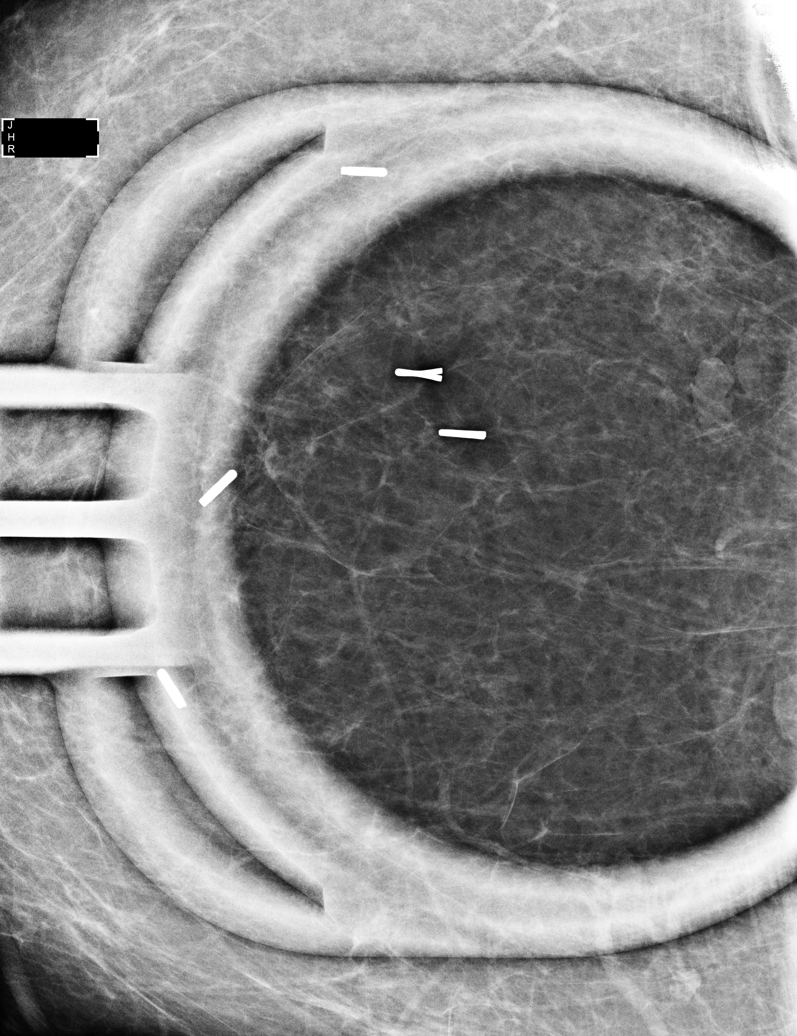

[R CC (2 of 2)]
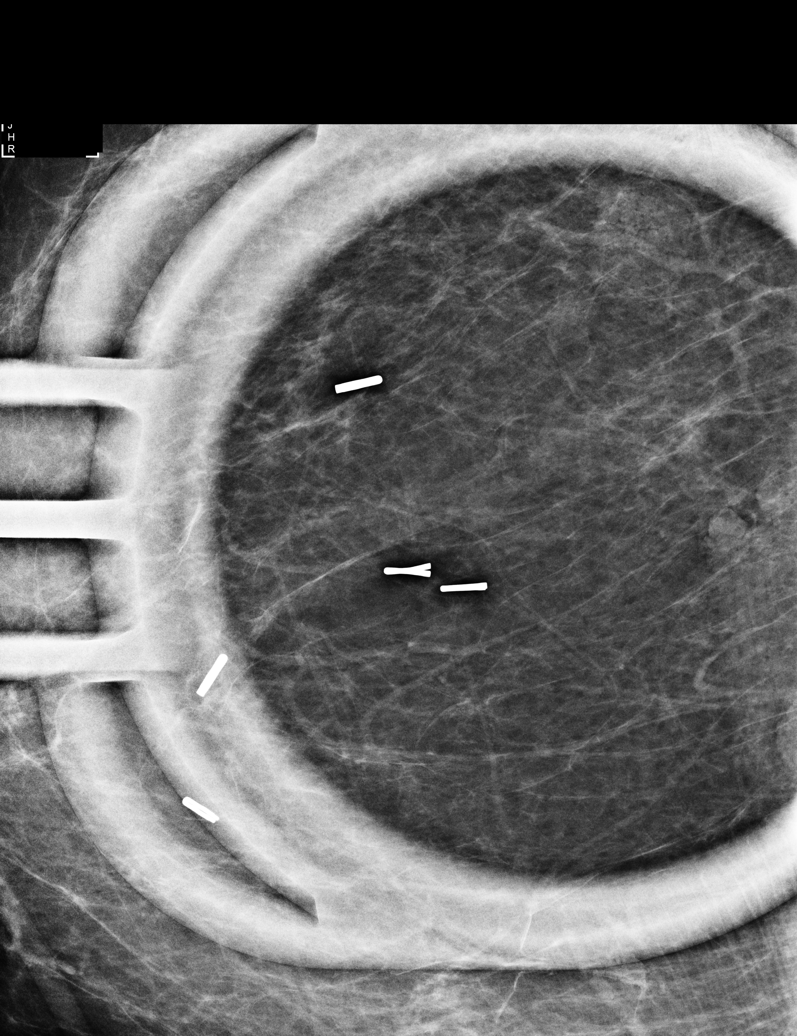

[R MLO synth-2D]
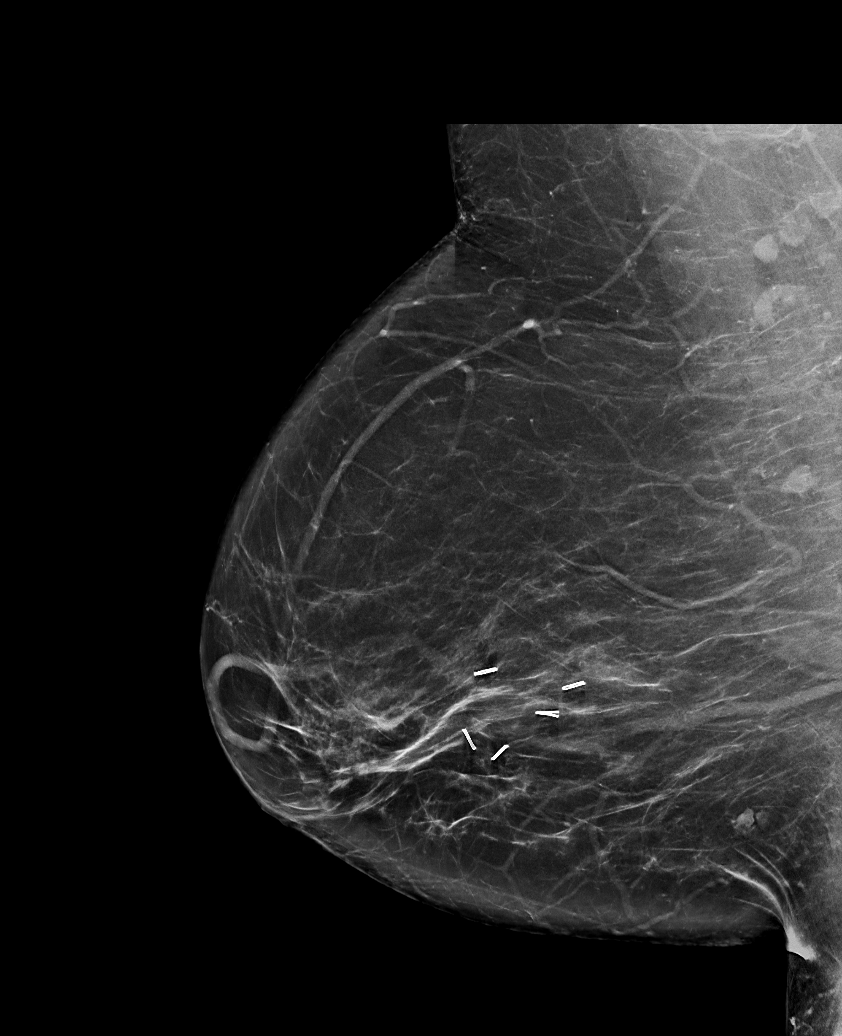

[L MLO synth-2D]
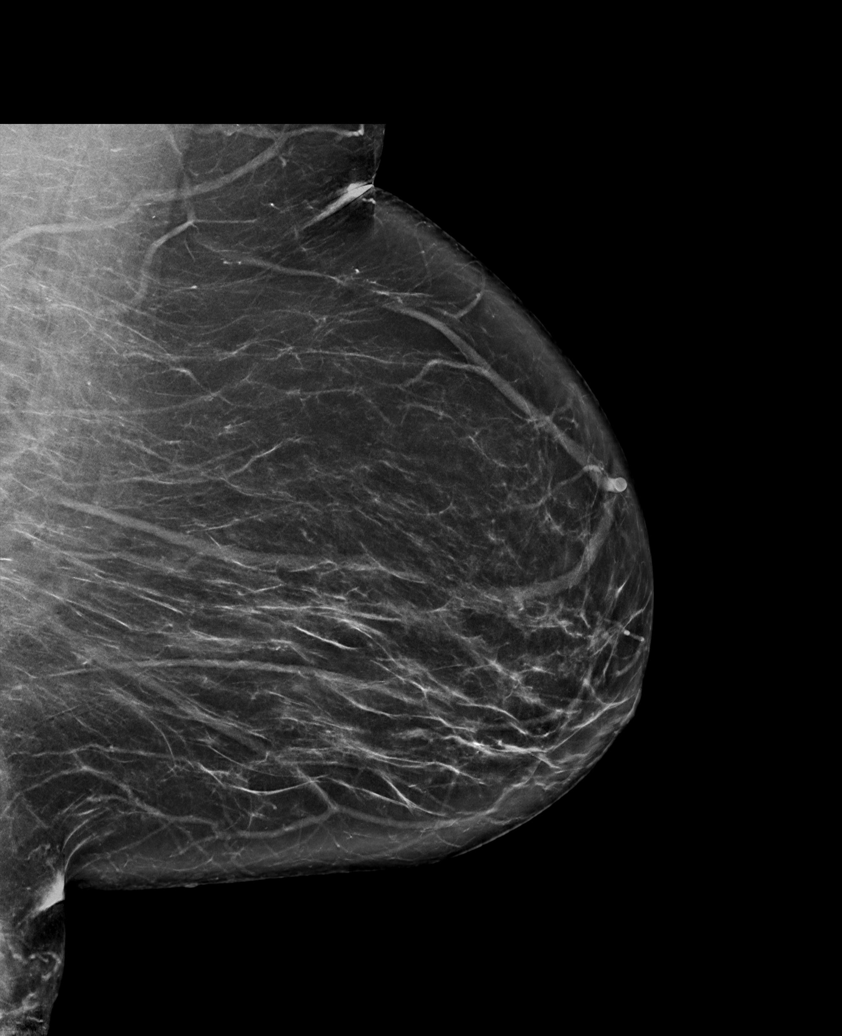

[L CC synth-2D]
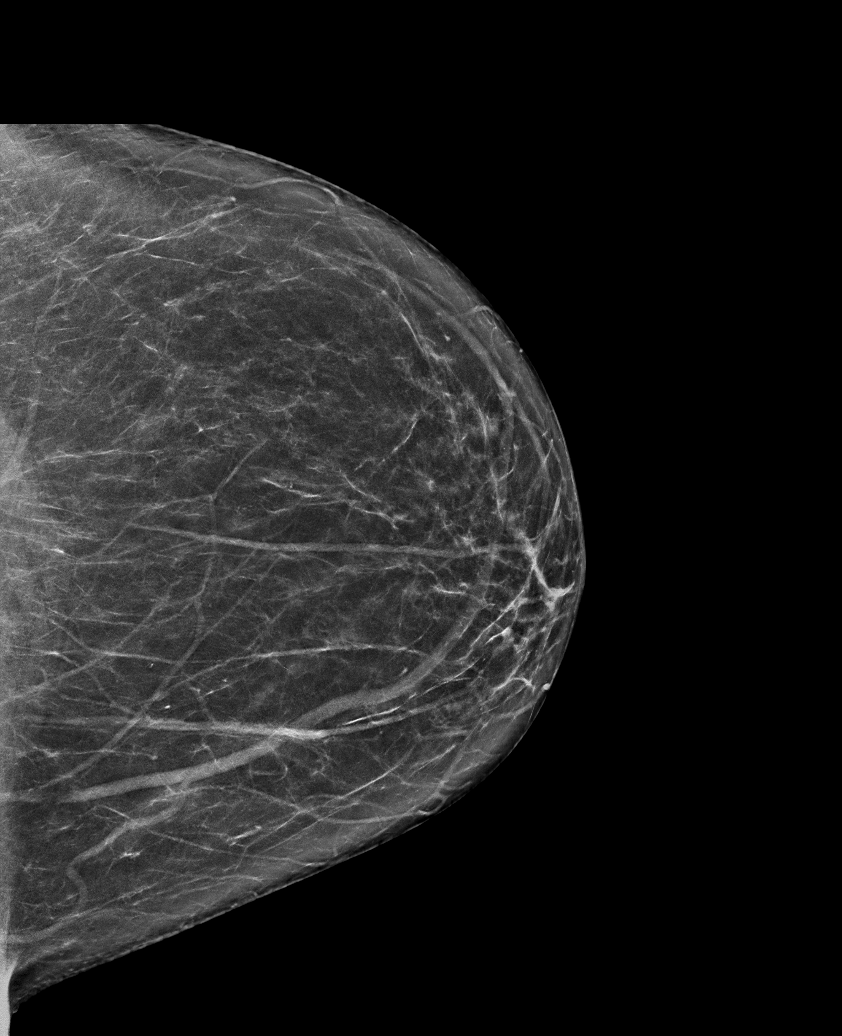

[R CC synth-2D]
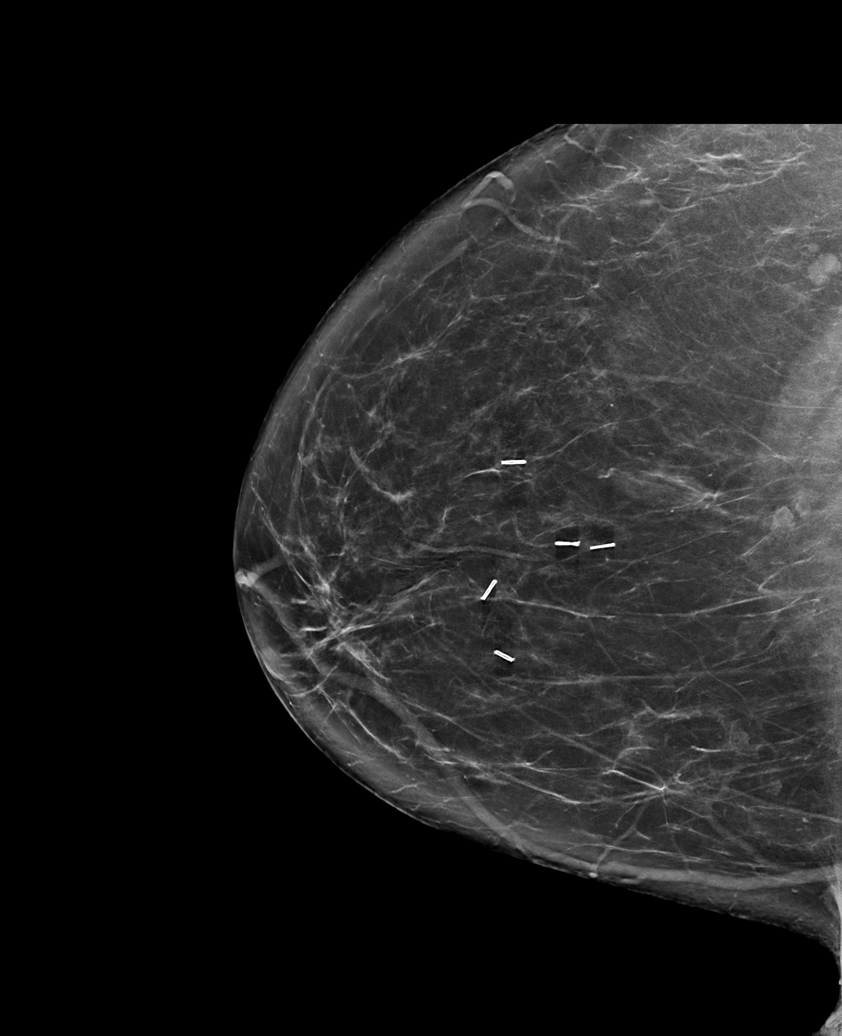

[6 of 21 positions shown; findings below may reference images not displayed]

ACR Breast Density Category b: There are scattered areas of
fibroglandular density.
FINDINGS: Stable postsurgical changes are noted in the central right breast.
No new or suspicious findings are identified in either breast. The
parenchymal pattern is stable.

Mammographic images were processed with CAD.
IMPRESSION: 1. No mammographic evidence of malignancy in either breast.
2. Stable right breast posttreatment changes.

RECOMMENDATION:
Diagnostic mammogram is suggested in 1 year. (Code:[9G])

I have discussed the findings and recommendations with the patient.
If applicable, a reminder letter will be sent to the patient
regarding the next appointment.

BI-RADS CATEGORY  2: Benign.

## 2019-11-28 ENCOUNTER — Encounter: Payer: Self-pay | Admitting: Internal Medicine

## 2019-11-28 ENCOUNTER — Ambulatory Visit (INDEPENDENT_AMBULATORY_CARE_PROVIDER_SITE_OTHER): Payer: Medicare Other

## 2019-11-28 ENCOUNTER — Other Ambulatory Visit: Payer: Self-pay

## 2019-11-28 ENCOUNTER — Ambulatory Visit: Payer: Medicare Other | Admitting: Internal Medicine

## 2019-11-28 VITALS — BP 134/68 | HR 65 | Temp 97.5°F | Ht 70.5 in | Wt 213.0 lb

## 2019-11-28 DIAGNOSIS — R21 Rash and other nonspecific skin eruption: Secondary | ICD-10-CM | POA: Diagnosis not present

## 2019-11-28 DIAGNOSIS — H40013 Open angle with borderline findings, low risk, bilateral: Secondary | ICD-10-CM | POA: Diagnosis not present

## 2019-11-28 DIAGNOSIS — J449 Chronic obstructive pulmonary disease, unspecified: Secondary | ICD-10-CM

## 2019-11-28 DIAGNOSIS — H5213 Myopia, bilateral: Secondary | ICD-10-CM | POA: Diagnosis not present

## 2019-11-28 DIAGNOSIS — H349 Unspecified retinal vascular occlusion: Secondary | ICD-10-CM | POA: Diagnosis not present

## 2019-11-28 DIAGNOSIS — Z961 Presence of intraocular lens: Secondary | ICD-10-CM | POA: Diagnosis not present

## 2019-11-28 IMAGING — DX DG CHEST 2V
2 series · 2 of 2 positions shown · non-contrast
Comparison: [DATE]

CLINICAL DATA: Asthma, COPD

EXAM:
CHEST - 2 VIEW

[chest pa]
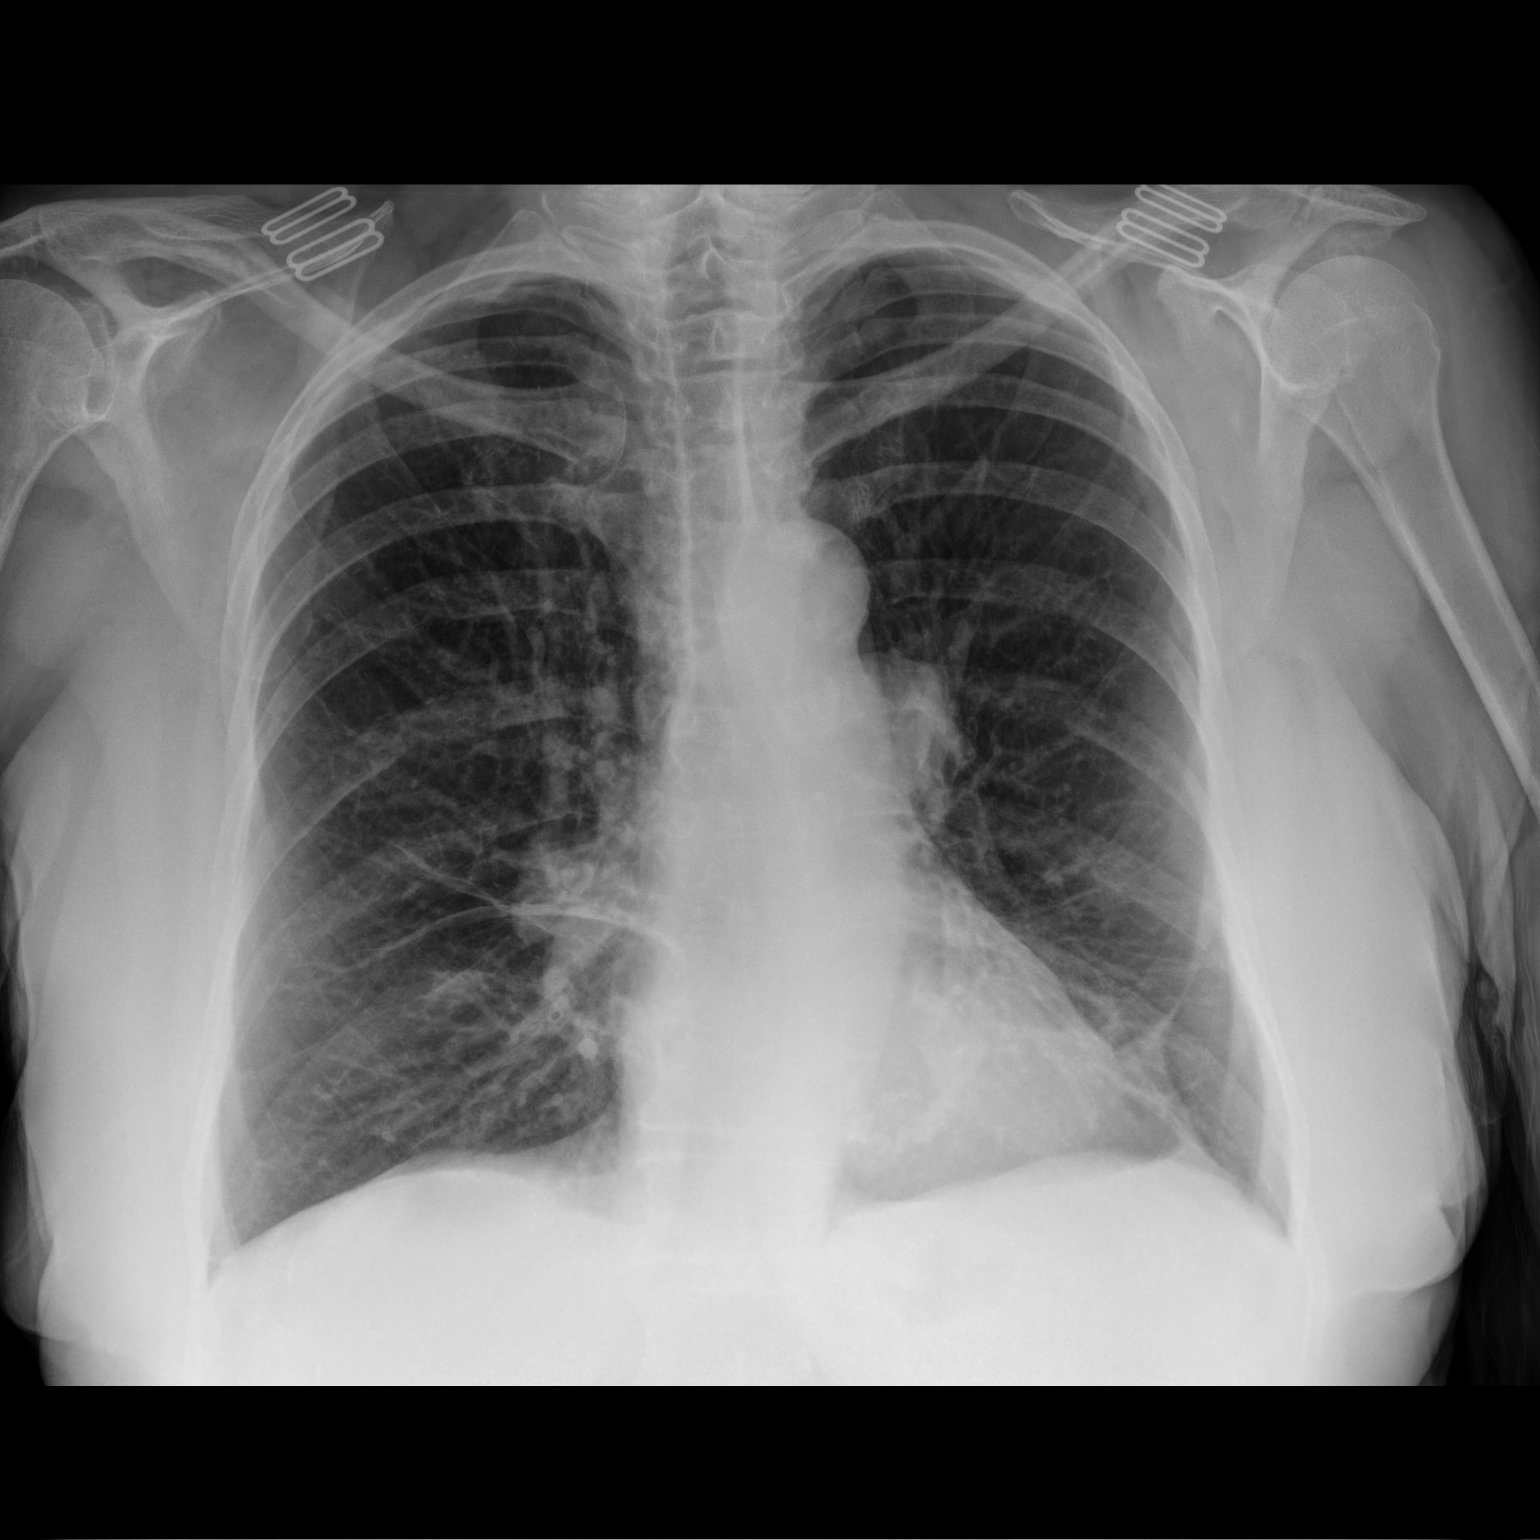

[chest lat]
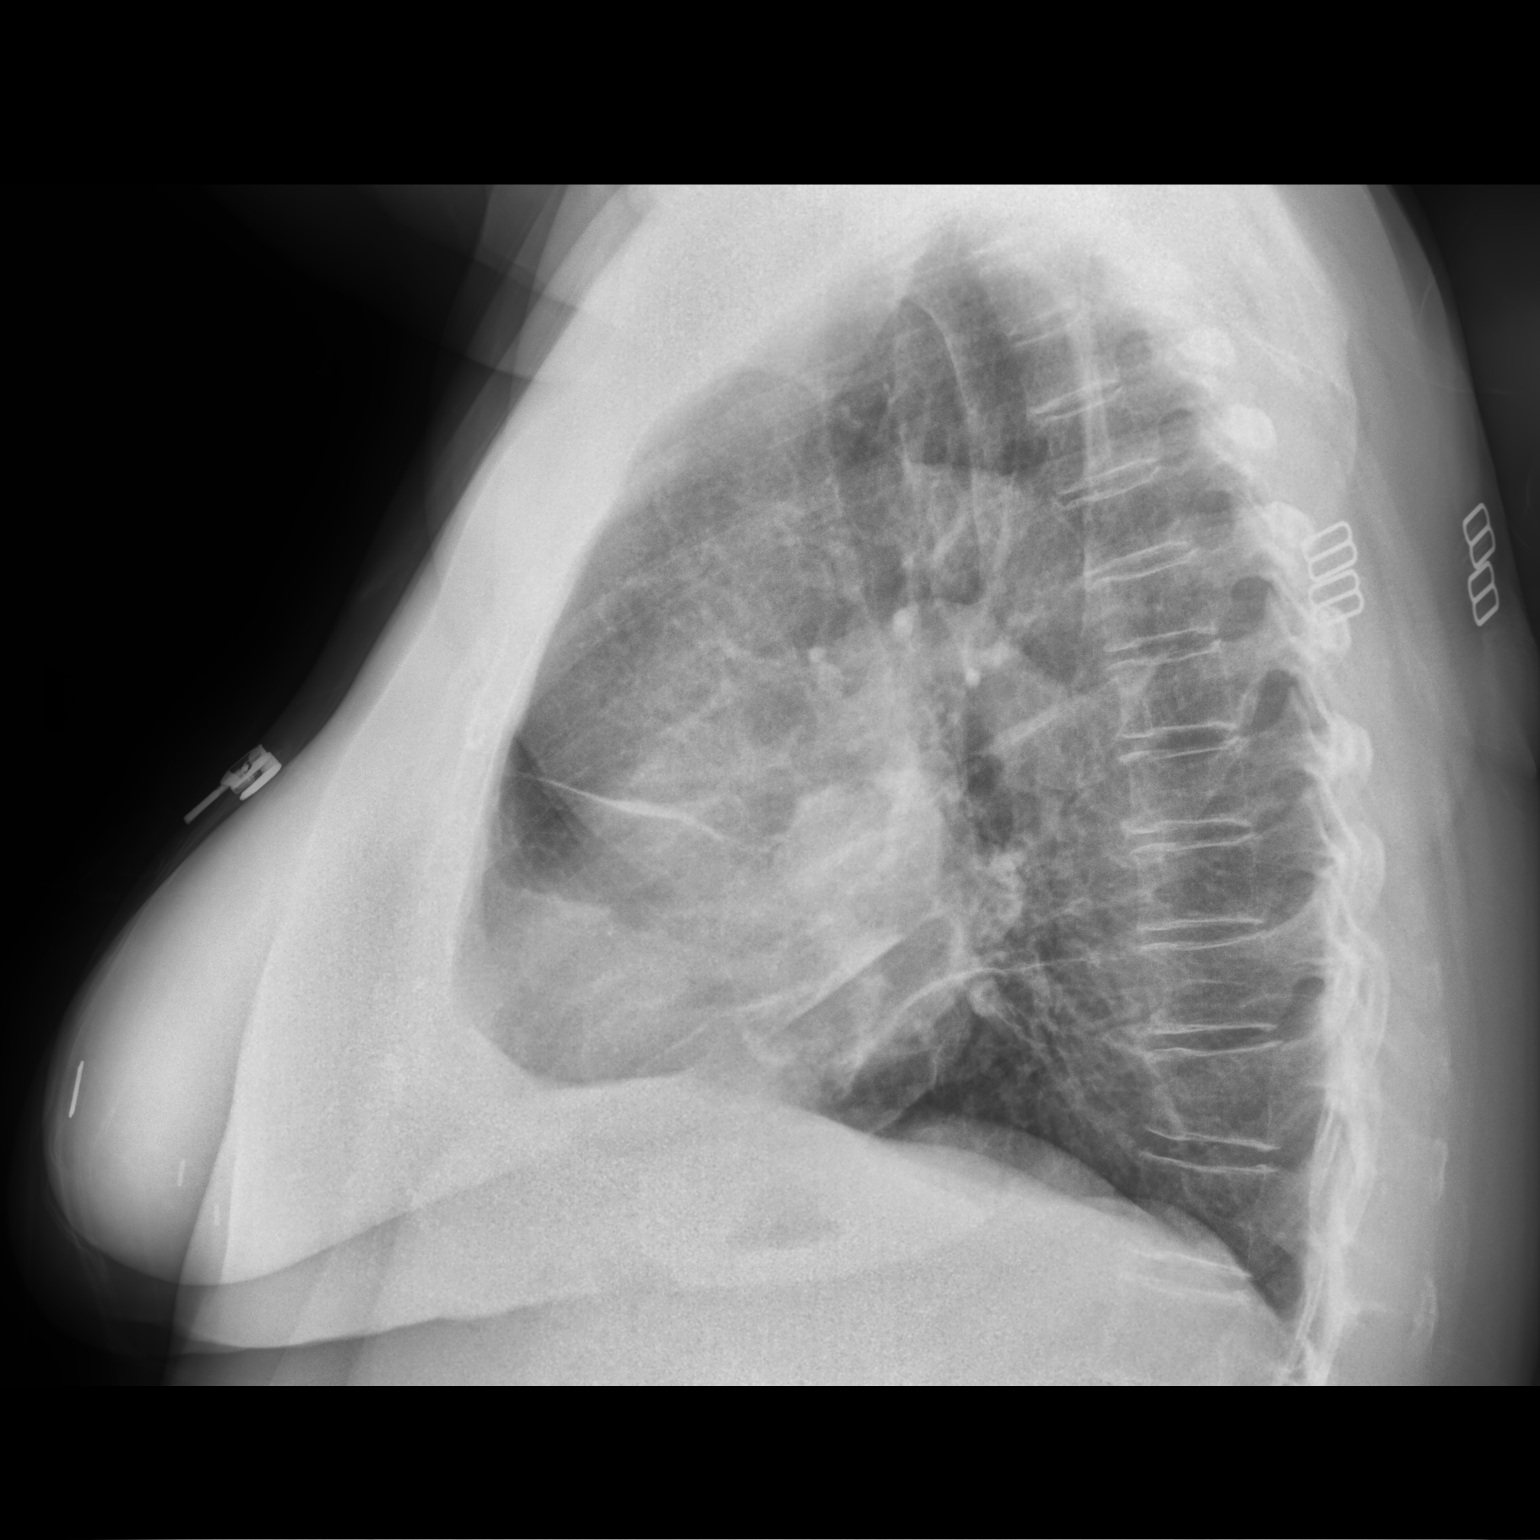

[2 of 2 positions shown; findings below may reference images not displayed]

FINDINGS: Normal heart size, mediastinal contours, and pulmonary vascularity.

Bibasilar scarring.

No acute infiltrate, pleural effusion or pneumothorax.

Osseous structures unremarkable.
IMPRESSION: Bibasilar scarring.

No acute abnormalities.

## 2019-11-28 NOTE — Patient Instructions (Signed)
Order- CXR    Dx COPD asthma/ COPD overlap  We can continue current meds

## 2019-11-28 NOTE — Progress Notes (Signed)
Patient ID: Laura Mendez, female    DOB: 18-Oct-1940, 79 y.o.   MRN: [id number]  HPI F former smoker followed for Severe asthma/ COPD FEV1 82%,  complicated by allergic rhinitis,  paroxysmal A. Fib Labs 11/23/16- allergy profile total IgE 409 on Xolair, elevated specific IgE for dust mite and cockroach. EOS 3,300, Nl ANA and alpha-gal Derm Path report reviewed- Spongiotic/ eczema process- does not describe it as urticaria. Xolair was changed to [facility] June, 2018, to see if Xolair was causing her rash-no improvement after 5 months Office Spirometry 12/20/2017-very severe obstructive airways disease.  FVC 2.09/60%, FEV1 0.90/34%, ratio 0.43 Quantiferon tb Gold 06/22/17- Neg ---------------------------------------------------------------.   05/31/19- 79 year old female former smoker followed for severe asthma/COPD(formerly used Xolair/Nucala), complicated by allergic rhinitis, paroxysmal A. Fib/ Xarelto, rash, breast cancer R,  Xarelto,  neb Duoneb, Wixela 250, ventolin hfa More runny nose/ pndrip in cold weather. Blames this for tighter chest- used rescue x 3 yesterday, but not needing nebulizer. Continues Wixela. Feels stable over time off Biologics. Has stayed in house since March. No new concerns.  CXR 11/28/2018-  The chest is hyperexpanded with attenuation of the pulmonary vasculature. Scar in the left lung base is unchanged. No consolidative process, pneumothorax or effusion. Heart size is normal. Aortic atherosclerosis is noted. No acute or focal bony abnormality. IMPRESSION: No acute disease.  11/28/19- 79 year old female former smoker followed for severe asthma/COPD(formerly used Xolair/Nucala), complicated by allergic rhinitis, paroxysmal A. Fib/ Xarelto, rash, breast cancer R,  Xarelto,  neb Duoneb, Wixela 250, ventolin hfa -----having runny nose, denies any other problems Using rescue 0-2x/ day, varies with weather/ season.  Not much sleep disturbance. Still using topical steroid  ointment for chronic, idiopathic rash, followed by dermatology. Rash was originally blamed on Xolair. Feels well. .   Review of Systems-see HPI   + = positive Constitutional:   No-   weight loss, night sweats, fevers, chills, fatigue, lassitude. HEENT:   No-  headaches, difficulty swallowing, tooth/dental problems, sore throat,       No-  sneezing, +itching, ear ache,  nasal congestion, +post nasal drip,  CV:  No-   chest pain, orthopnea, PND, swelling in lower extremities, anasarca, dizziness, palpitations Resp:  + shortness of breath with exertion or at rest- unchanged             No-   productive cough,  + non-productive cough,  No-  coughing up of blood.              No-   change in color of mucus.  + wheezing.   Skin:  +rash or lesions. GI:  No-   heartburn, indigestion, abdominal pain, nausea, vomiting, GU:  MS:  +   joint pain or swelling.   Neuro- nothing unusual  Psych:  No- change in mood or affect. No depression or anxiety.  No memory loss  Objective:     General- Alert, Oriented, Affect-appropriate, Distress- none acute, Pleasant, + Overweight Skin- + numerous unremarkable keratoses on her back with mild diffuse erythema suggesting excoriation. Lymphadenopathy- none Head- atraumatic            Eyes- Gross vision intact, PERRLA, conjunctivae clear secretions            Ears- Hearing, canals normal            Nose- Mild turbinate edema, No-Septal dev, mucus, polyps, erosion, perforation             Throat- Mallampati II ,  mucosa -not red, drainage- none, tonsils- atrophic Neck- flexible , trachea midline, no stridor , thyroid nl, carotid no bruit Chest - symmetrical excursion , unlabored           Heart/CV- RRR +, no murmur , no gallop  , no rub, nl s1 s2                           - JVD- none , edema- none, stasis changes- none, varices- none           Lung- +clear to P&A/ distant/unlabored, wheeze- none,  cough+ minimal, dullness-none, rub- none           Chest wall-   Abd-  Br/ Gen/ Rectal- Not done, not indicated Extrem- cyanosis- none, clubbing, none, atrophy- none, strength- nl Neuro- grossly intact to observation

## 2019-12-06 DIAGNOSIS — H1132 Conjunctival hemorrhage, left eye: Secondary | ICD-10-CM | POA: Diagnosis not present

## 2019-12-11 ENCOUNTER — Other Ambulatory Visit: Payer: Self-pay | Admitting: Hematology and Oncology

## 2019-12-11 DIAGNOSIS — Z9889 Other specified postprocedural states: Secondary | ICD-10-CM

## 2020-01-07 DIAGNOSIS — L301 Dyshidrosis [pompholyx]: Secondary | ICD-10-CM | POA: Diagnosis not present

## 2020-01-07 DIAGNOSIS — L280 Lichen simplex chronicus: Secondary | ICD-10-CM | POA: Diagnosis not present

## 2020-02-07 DIAGNOSIS — L280 Lichen simplex chronicus: Secondary | ICD-10-CM | POA: Diagnosis not present

## 2020-02-08 ENCOUNTER — Other Ambulatory Visit: Payer: Self-pay | Admitting: Internal Medicine

## 2020-02-08 ENCOUNTER — Telehealth: Payer: Self-pay | Admitting: Internal Medicine

## 2020-02-08 NOTE — Telephone Encounter (Signed)
Spoke with pt and advised that Albuterol refill has been sent to pharmacy. Pt verbalized understanding. Nothing further needed.

## 2020-02-14 ENCOUNTER — Telehealth: Payer: Self-pay | Admitting: Internal Medicine

## 2020-02-14 NOTE — Telephone Encounter (Signed)
I do recommend a covid booster 6-8 months after the last previous covid vaccine dose.

## 2020-02-14 NOTE — Telephone Encounter (Signed)
Called and spoke to pt. Pt is questioning if she should get the COVID booster. She is requesting Dr. Janee Morn opinion.   Dr. Annamaria Boots please advise. Thanks.

## 2020-02-14 NOTE — Telephone Encounter (Signed)
Called and spoke with pt letting her know the info stated by CY and she verbalized understanding. Nothing further needed. 

## 2020-02-17 NOTE — Assessment & Plan Note (Signed)
Remains quite well controlled without significant exacerbation Plan- continue present meds, CXR

## 2020-02-17 NOTE — Assessment & Plan Note (Signed)
Not related to use of Biologics Xolair, Nucala Followed by Dermatology

## 2020-02-27 ENCOUNTER — Encounter: Payer: Medicare Other | Admitting: Internal Medicine

## 2020-03-18 ENCOUNTER — Ambulatory Visit: Payer: Medicare Other | Attending: Internal Medicine

## 2020-03-18 DIAGNOSIS — Z23 Encounter for immunization: Secondary | ICD-10-CM

## 2020-03-18 NOTE — Progress Notes (Signed)
   Covid-19 Vaccination Clinic  Name:  Laura Mendez    MRN: [id number] DOB: [birth year]  03/18/2020  Laura Mendez was observed post Covid-19 immunization for 15 minutes without incident. She was provided with Vaccine Information Sheet and instruction to access the V-Safe system.   Laura Mendez was instructed to call 911 with any severe reactions post vaccine: Marland Kitchen Difficulty breathing  . Swelling of face and throat  . A fast heartbeat  . A bad rash all over body  . Dizziness and weakness

## 2020-03-20 ENCOUNTER — Ambulatory Visit: Payer: Self-pay | Admitting: *Deleted

## 2020-03-20 NOTE — Telephone Encounter (Signed)
Patient is calling to report that she had joint pain and mental fogginess with the Center booster vaccine. Patient states she is doing better- but wanted to make someone aware of her SE. Note to be forwarded to PCP.   Reason for Disposition . COVID-19 vaccine, systemic reactions (e.g., fatigue, fever, muscle aches), questions about  Answer Assessment - Initial Assessment Questions 1. MAIN CONCERN OR SYMPTOM:  "What is your main concern right now?" "What question do you have?" "What's the main symptom you're worried about?" (e.g., fever, pain, redness, swelling)     Joint pain, thought process foggy 2. VACCINE: "What vaccination did you receive?" "Is this your first or second shot?" (e.g., none; AstraZeneca, J&J, Zena, Coca-Cola, other)     Coca-Cola booster 3. SYMPTOM ONSET: "When did the symptoms begin?" (e.g., not relevant; hours, days)      yesterday 4. SYMPTOM SEVERITY: "How bad is it?"      Patient is improving 5. FEVER: "Is there a fever?" If Yes, ask: "What is it, how was it measured, and when did it start?"      No fever 6. PAST REACTIONS: "Have you reacted to immunizations before?" If Yes, ask: "What happened?"     Patient states she die have reaction with second dose- felt bad- fatigued 7. OTHER SYMPTOMS: "Do you have any other symptoms?"     Mental fogginess  Protocols used: CORONAVIRUS (COVID-19) VACCINE QUESTIONS AND REACTIONS-A-AH

## 2020-03-25 DIAGNOSIS — Z5181 Encounter for therapeutic drug level monitoring: Secondary | ICD-10-CM | POA: Diagnosis not present

## 2020-03-25 DIAGNOSIS — L309 Dermatitis, unspecified: Secondary | ICD-10-CM | POA: Diagnosis not present

## 2020-04-08 ENCOUNTER — Telehealth: Payer: Self-pay | Admitting: Internal Medicine

## 2020-04-08 ENCOUNTER — Ambulatory Visit (INDEPENDENT_AMBULATORY_CARE_PROVIDER_SITE_OTHER): Payer: Medicare Other | Admitting: Internal Medicine

## 2020-04-08 ENCOUNTER — Encounter: Payer: Self-pay | Admitting: Internal Medicine

## 2020-04-08 ENCOUNTER — Other Ambulatory Visit: Payer: Self-pay

## 2020-04-08 VITALS — BP 140/76 | HR 70 | Temp 98.0°F | Ht 70.5 in | Wt 208.0 lb

## 2020-04-08 DIAGNOSIS — Z170 Estrogen receptor positive status [ER+]: Secondary | ICD-10-CM

## 2020-04-08 DIAGNOSIS — R899 Unspecified abnormal finding in specimens from other organs, systems and tissues: Secondary | ICD-10-CM

## 2020-04-08 DIAGNOSIS — Z23 Encounter for immunization: Secondary | ICD-10-CM | POA: Diagnosis not present

## 2020-04-08 NOTE — Telephone Encounter (Signed)
Left vm for pt to return call to office regarding message

## 2020-04-08 NOTE — Patient Instructions (Signed)
We will check the labs today and let you know about the results.    

## 2020-04-08 NOTE — Telephone Encounter (Signed)
We are checking labs for monitoring of her breast cancer medicine so that code is used.

## 2020-04-08 NOTE — Telephone Encounter (Signed)
Spoke with pt she was very upset stated that her and Dr. Sharlet Salina did not discuss her breast cancer and it should have not been put on AVS today as reason for visit. Pt states she came in for kidney function check due to labs done at her dermatologist office. Pt stated that she will be here tomorrow 04/09/2020 to get a reprinted AVS with the correct information on it. Pt was very upset and wants answers to the reason of the breast cancer being documented.

## 2020-04-08 NOTE — Telephone Encounter (Signed)
   Patient calling to discuss diagnosis on AVS from today. She is concerned that AVS mentions breast cancer   Requesting call

## 2020-04-08 NOTE — Telephone Encounter (Cosign Needed)
Please advise  Patient calling to discuss diagnosis on AVS from today. She is concerned that AVS mentions breast cancer   Requesting call  Stated that she did not discuss this with you and would like to make sure its taking out of her chart.

## 2020-04-08 NOTE — Progress Notes (Signed)
   Subjective:   Patient ID: Laura Mendez, female    DOB: 01/22/1941, 79 y.o.   MRN: [id number]  HPI The patient is a 79 YO female coming in for concerns about abnormal labs. She saw dermatology Dr Delman Cheadle and they did labs trying to figure out a rash. They told her she had decreased kidney function but not any numbers and asked her to see [place] quickly. She was told that they would fax [place] this information. She denies pain in back. Denies urinary pain or urgency. Urine appears normal in color.   Review of Systems  Constitutional: Negative.   HENT: Negative.   Eyes: Negative.   Respiratory: Negative for cough, chest tightness and shortness of breath.   Cardiovascular: Negative for chest pain, palpitations and leg swelling.  Gastrointestinal: Negative for abdominal distention, abdominal pain, constipation, diarrhea, nausea and vomiting.  Musculoskeletal: Negative.   Skin: Negative.   Neurological: Negative.   Psychiatric/Behavioral: Negative.     Objective:  Physical Exam Constitutional:      Appearance: She is well-developed.  HENT:     Head: Normocephalic and atraumatic.  Cardiovascular:     Rate and Rhythm: Normal rate and regular rhythm.  Pulmonary:     Effort: Pulmonary effort is normal. No respiratory distress.     Breath sounds: Normal breath sounds. No wheezing or rales.  Abdominal:     General: Bowel sounds are normal. There is no distension.     Palpations: Abdomen is soft.     Tenderness: There is no abdominal tenderness. There is no rebound.  Musculoskeletal:     Cervical back: Normal range of motion.  Skin:    General: Skin is warm and dry.  Neurological:     Mental Status: She is alert and oriented to person, place, and time.     Coordination: Coordination normal.     Vitals:   04/08/20 0919  BP: 140/76  Pulse: 70  Temp: 98 F (36.7 C)  TempSrc: Oral  SpO2: 94%  Weight: 208 lb (94.3 kg)  Height: 5' 10.5" (1.791 m)    This visit occurred during  the SARS-CoV-2 public health emergency.  Safety protocols were in place, including screening questions prior to the visit, additional usage of staff PPE, and extensive cleaning of exam room while observing appropriate contact time as indicated for disinfecting solutions.   Flu shot given at visit  Assessment & Plan:  Visit time 10 minutes in face to face communication with patient and coordination of care, additional 20 minutes spent in record review, coordination or care, ordering tests, communicating/referring to other healthcare professionals including excessive time spent on the phone trying to reach dermatology office without success of anyone answering phones, documenting in medical records all on the same day of the visit for total time 30 minutes spent on the visit.

## 2020-04-09 ENCOUNTER — Other Ambulatory Visit (INDEPENDENT_AMBULATORY_CARE_PROVIDER_SITE_OTHER): Payer: Medicare Other

## 2020-04-09 DIAGNOSIS — C50511 Malignant neoplasm of lower-outer quadrant of right female breast: Secondary | ICD-10-CM

## 2020-04-09 DIAGNOSIS — Z170 Estrogen receptor positive status [ER+]: Secondary | ICD-10-CM | POA: Diagnosis not present

## 2020-04-09 LAB — LIPID PANEL
Cholesterol: 177 mg/dL (ref 0–200)
HDL: 36.1 mg/dL — ABNORMAL LOW (ref >39.00)
NonHDL: 141.08
Total CHOL/HDL Ratio: 5
Triglycerides: 222 mg/dL — ABNORMAL HIGH (ref 0.0–149.0)
VLDL: 44.4 mg/dL — ABNORMAL HIGH (ref 0.0–40.0)

## 2020-04-09 LAB — COMPREHENSIVE METABOLIC PANEL
ALT: 18 U/L (ref 0–35)
AST: 23 U/L (ref 0–37)
Albumin: 4.1 g/dL (ref 3.5–5.2)
Alkaline Phosphatase: 57 U/L (ref 39–117)
BUN: 14 mg/dL (ref 6–23)
CO2: 30 mEq/L (ref 19–32)
Calcium: 9.6 mg/dL (ref 8.4–10.5)
Chloride: 103 mEq/L (ref 96–112)
Creatinine, Ser: 0.92 mg/dL (ref 0.40–1.20)
GFR: 59.21 mL/min — ABNORMAL LOW (ref >60.00)
Glucose, Bld: 141 mg/dL — ABNORMAL HIGH (ref 70–99)
Potassium: 3.8 mEq/L (ref 3.5–5.1)
Sodium: 142 mEq/L (ref 135–145)
Total Bilirubin: 0.5 mg/dL (ref 0.2–1.2)
Total Protein: 7.2 g/dL (ref 6.0–8.3)

## 2020-04-09 LAB — CBC
HCT: 43.6 % (ref 36.0–46.0)
Hemoglobin: 14.3 g/dL (ref 12.0–15.0)
MCHC: 32.8 g/dL (ref 30.0–36.0)
MCV: 90.4 fl (ref 78.0–100.0)
Platelets: 248 10*3/uL (ref 150.0–400.0)
RBC: 4.83 Mil/uL (ref 3.87–5.11)
RDW: 13.9 % (ref 11.5–15.5)
WBC: 6 10*3/uL (ref 4.0–10.5)

## 2020-04-09 LAB — LDL CHOLESTEROL, DIRECT: Direct LDL: 108 mg/dL

## 2020-04-10 DIAGNOSIS — R899 Unspecified abnormal finding in specimens from other organs, systems and tissues: Secondary | ICD-10-CM | POA: Insufficient documentation

## 2020-04-10 NOTE — Assessment & Plan Note (Signed)
We were finally able to get the information from dermatology several days after visit with Cr 1.07 which they deemed abnormal. Checked CMP at visit with stable Cr from prior. This does not represent a change in kidney function.

## 2020-04-24 ENCOUNTER — Encounter: Payer: Medicare Other | Admitting: Internal Medicine

## 2020-04-30 ENCOUNTER — Encounter: Payer: Self-pay | Admitting: Cardiology

## 2020-04-30 ENCOUNTER — Other Ambulatory Visit: Payer: Self-pay

## 2020-04-30 ENCOUNTER — Ambulatory Visit: Payer: Medicare Other | Admitting: Cardiology

## 2020-04-30 VITALS — BP 112/70 | HR 65 | Ht 70.5 in | Wt 208.0 lb

## 2020-04-30 DIAGNOSIS — I4892 Unspecified atrial flutter: Secondary | ICD-10-CM | POA: Diagnosis not present

## 2020-04-30 DIAGNOSIS — Z7901 Long term (current) use of anticoagulants: Secondary | ICD-10-CM | POA: Diagnosis not present

## 2020-04-30 DIAGNOSIS — Z8673 Personal history of transient ischemic attack (TIA), and cerebral infarction without residual deficits: Secondary | ICD-10-CM | POA: Diagnosis not present

## 2020-04-30 NOTE — Progress Notes (Signed)
Cardiology Office Note:    Date:  04/30/2020   ID:  Laura Mendez, DOB 07/06/1940, MRN [id number]  PCP:  Hoyt Koch, MD  [facility] Cardiologist:  Candee Furbish, MD  [facility] Electrophysiologist:  None   Referring MD: Hoyt Koch, *     History of Present Illness:    Laura Mendez is a 79 y.o. female here for the follow-up of atrial flutter.  Atrial flutter was diagnosed on 12/11/2015. She auto converted after receiving 5 mg of metoprolol.  Asthma seem to exacerbate this.  Has been on Xarelto with no major bleeding.  Echocardiogram in 2017 showed normal EF with severely dilated left atrium of 55 mm.  Cardizem was previously stopped because of rash.  Verapamil was started.  Methotrexate was started by dermatology but now off, working on mab.  The rash did not seem to be related to drugs.  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 Asthma   . Chronic airway obstruction, not elsewhere classified   . Diastolic dysfunction without heart failure   . Disorder of bone and cartilage, unspecified   . Dysrhythmia    A-Fib  . Family history of colon cancer   . Family history of kidney cancer   . Family history of ovarian cancer   . Paroxysmal atrial flutter ([place])   . Stroke [facility])    TIA  . TIA (transient ischemic attack)   . Unspecified asthma(493.90)     Past Surgical History:  Procedure Laterality Date  . ABDOMINAL HYSTERECTOMY    . BREAST LUMPECTOMY Right 12/30/2017  . BREAST LUMPECTOMY WITH RADIOACTIVE SEED LOCALIZATION Right 12/30/2017   Procedure: BREAST LUMPECTOMY WITH RADIOACTIVE SEED LOCALIZATION X'S 3;  Surgeon: Rolm Bookbinder, MD;  Location: [place];  Service: General;  Laterality: Right;  . BUNIONECTOMY Bilateral   . EYE SURGERY Bilateral    cataract removal    Current Medications: Current Meds  Medication Sig  . albuterol (VENTOLIN HFA) 108 (90  Base) MCG/ACT inhaler TAKE 2 PUFFS BY MOUTH EVERY 4 HOURS AS NEEDED  . cholecalciferol (VITAMIN D) 1000 units tablet Take 1,000 Units by mouth daily.  . clobetasol cream (TEMOVATE) 6.76 % Apply 1 application topically as needed.  . fish oil-omega-3 fatty acids 1000 MG capsule Take 1 g by mouth daily.   . Fluticasone-Salmeterol (WIXELA INHUB) 250-50 MCG/DOSE AEPB Inhale 1 puff into the lungs 2 (two) times daily.  Marland Kitchen ipratropium-albuterol (DUONEB) 0.5-2.5 (3) MG/3ML SOLN Take 3 mLs by nebulization every 6 (six) hours as needed.  Marland Kitchen letrozole (FEMARA) 2.5 MG tablet Take 1 tablet (2.5 mg total) by mouth daily.  Marland Kitchen MEGARED OMEGA-3 KRILL OIL 500 MG CAPS Take 500 mg by mouth daily.   . Multiple Vitamin (MULTIVITAMIN) tablet Take 1 tablet by mouth daily.   Marland Kitchen triamcinolone ointment (KENALOG) 0.1 % Apply 1 application topically 2 (two) times daily as needed (for itching).  Alveda Reasons 20 MG TABS tablet TAKE 1 TABLET BY MOUTH EVERY DAY WITH SUPPER   Current Facility-Administered Medications for the 04/30/20 encounter (Office Visit) with Jerline Pain, MD  Medication  . Mepolizumab SOLR 100 mg     Allergies:   Clarithromycin, Ibuprofen, Codeine, Codeine phosphate, Diltiazem, Tape, Tapentadol, and Verapamil hcl er   Social History   Socioeconomic History  . Marital status: Divorced    Spouse name: Not on file  . Number of children: Not on  file  . Years of education: Not on file  . Highest education level: Not on file  Occupational History  . Occupation: retired  Tobacco Use  . Smoking status: Former Smoker    Years: 0.00    Types: Cigarettes    Quit date: 05/24/1977    Years since quitting: 42.9  . Smokeless tobacco: Never Used  . Tobacco comment: 7/7 pt states she occasionally smoked, let cigs "burn" more than she smoked them  Vaping Use  . Vaping Use: Never used  Substance and Sexual Activity  . Alcohol use: No  . Drug use: No  . Sexual activity: Not on file  Other Topics Concern  . Not on  file  Social History Narrative  . Not on file   Social Determinants of Health   Financial Resource Strain:   . Difficulty of Paying Living Expenses: Not on file  Food Insecurity:   . Worried About Charity fundraiser in the Last Year: Not on file  . Ran Out of Food in the Last Year: Not on file  Transportation Needs:   . Lack of Transportation (Medical): Not on file  . Lack of Transportation (Non-Medical): Not on file  Physical Activity:   . Days of Exercise per Week: Not on file  . Minutes of Exercise per Session: Not on file  Stress:   . Feeling of Stress : Not on file  Social Connections:   . Frequency of Communication with Friends and Family: Not on file  . Frequency of Social Gatherings with Friends and Family: Not on file  . Attends Religious Services: Not on file  . Active Member of Clubs or Organizations: Not on file  . Attends Archivist Meetings: Not on file  . Marital Status: Not on file     Family History: The patient's family history includes COPD in her father; Cancer in an other family member; Cirrhosis in her sister; Colon cancer in her cousin, maternal uncle, and maternal uncle; Colon cancer (age of onset: 35) in her mother; Heart attack in her paternal uncle; Heart disease in her maternal grandfather; Kidney cancer (age of onset: 68) in her sister; Kidney cancer (age of onset: 58) in her brother; Lung cancer in her sister; Melanoma (age of onset: 71) in her sister; Ovarian cancer in her maternal aunt and maternal grandmother; Ovarian cancer (age of onset: 21) in her sister; Parkinson's disease in her brother; Rectal cancer in her paternal grandfather. There is no history of Breast cancer.  ROS:   Please see the history of present illness.     All other systems reviewed and are negative.  EKGs/Labs/Other Studies Reviewed:    The following studies were reviewed today: Prior echocardiogram from 2017 reviewed as above.  EKG:  EKG is  ordered today.  The  ekg ordered today demonstrates sinus rhythm 65 with no other abnormalities.  Recent Labs: 04/09/2020: ALT 18; BUN 14; Creatinine, Ser 0.92; Hemoglobin 14.3; Platelets 248.0; Potassium 3.8; Sodium 142  Recent Lipid Panel    Component Value Date/Time   CHOL 177 04/09/2020 0955   TRIG 222.0 (H) 04/09/2020 0955   TRIG 183 (H) 03/04/2006 1034   HDL 36.10 (L) 04/09/2020 0955   CHOLHDL 5 04/09/2020 0955   VLDL 44.4 (H) 04/09/2020 0955   LDLCALC 129 (H) 01/13/2016 0922   LDLDIRECT 108.0 04/09/2020 0955         Physical Exam:    VS:  BP 112/70 (BP Location: Left Arm, Patient Position: Sitting,  Cuff Size: Normal)   Pulse 65   Ht 5' 10.5" (1.791 m)   Wt 208 lb (94.3 kg)   SpO2 95%   BMI 29.42 kg/m     Wt Readings from Last 3 Encounters:  04/30/20 208 lb (94.3 kg)  04/08/20 208 lb (94.3 kg)  11/28/19 213 lb (96.6 kg)     GEN:  Well nourished, well developed in no acute distress HEENT: Normal NECK: No JVD; No carotid bruits LYMPHATICS: No lymphadenopathy CARDIAC: RRR, no murmurs, rubs, gallops RESPIRATORY:  Clear to auscultation without rales, wheezing or rhonchi  ABDOMEN: Soft, non-tender, non-distended MUSCULOSKELETAL:  No edema; No deformity  SKIN: Warm and dry, rash noted.  NEUROLOGIC:  Alert and oriented x 3 PSYCHIATRIC:  Normal affect   ASSESSMENT:    1. Paroxysmal atrial flutter ([place])   2. Chronic anticoagulation   3. History of TIA (transient ischemic attack)    PLAN:    In order of problems listed above:  Typical atrial flutter paroxysmal -2017 episode with auto conversion after metoprolol 5 mg. -If this returns, she would be a good candidate for ablative therapy.  Current EKG reassuring with sinus rhythm 65 bpm.  Chronic anticoagulation -She is continuing with Xarelto 20 mg a day.  Hemoglobin 14.3 creatinine 0.92 at last check.  From outside labs.  Chronic asthma -Dr. Annamaria Boots.  Doing well.  Stable.  She states that her inhaler was $40 at [facility] and it is $9  at [facility].  She changed pharmacies.  Rash --Challenging for her.  Has seen several different dermatologist.  Blood work.  Unable to obtain a unifying diagnosis.  She is currently trying to get an immunomodulator approved by insurance.  Hopefully this will help.  1 year follow up  Shared Decision Making/Informed Consent        Medication Adjustments/Labs and Tests Ordered: Current medicines are reviewed at length with the patient today.  Concerns regarding medicines are outlined above.  Orders Placed This Encounter  Procedures  . EKG 12-Lead   No orders of the defined types were placed in this encounter.   Patient Instructions  Medication Instructions:  Your physician recommends that you continue on your current medications as directed. Please refer to the Current Medication list given to you today.  *If you need a refill on your cardiac medications before your next appointment, please call your pharmacy*   Lab Work: None Ordered If you have labs (blood work) drawn today and your tests are completely normal, you will receive your results only by: Marland Kitchen MyChart Message (if you have MyChart) OR . A paper copy in the mail If you have any lab test that is abnormal or we need to change your treatment, we will call you to review the results.   Testing/Procedures: None Ordered   Follow-Up: At [facility], you and your health needs are our priority.  As part of our continuing mission to provide you with exceptional heart care, we have created designated Provider Care Teams.  These Care Teams include your primary Cardiologist (physician) and Advanced Practice Providers (APPs -  Physician Assistants and Nurse Practitioners) who all work together to provide you with the care you need, when you need it.  We recommend signing up for the patient portal called "MyChart".  Sign up information is provided on this After Visit Summary.  MyChart is used to connect with patients for Virtual  Visits (Telemedicine).  Patients are able to view lab/test results, encounter notes, upcoming appointments, etc.  Non-urgent  messages can be sent to your provider as well.   To learn more about what you can do with MyChart, go to [email or web address].    Your next appointment:   1 year(s)  The format for your next appointment:   In Person  Provider:   You may see Candee Furbish, MD or one of the following Advanced Practice Providers on your designated Care Team:    Truitt Merle, NP  Cecilie Kicks, NP  Kathyrn Drown, NP        Signed, Candee Furbish, MD  04/30/2020 9:56 AM    [place]

## 2020-04-30 NOTE — Patient Instructions (Signed)
Medication Instructions:  Your physician recommends that you continue on your current medications as directed. Please refer to the Current Medication list given to you today.  *If you need a refill on your cardiac medications before your next appointment, please call your pharmacy*   Lab Work: None Ordered If you have labs (blood work) drawn today and your tests are completely normal, you will receive your results only by: Marland Kitchen MyChart Message (if you have MyChart) OR . A paper copy in the mail If you have any lab test that is abnormal or we need to change your treatment, we will call you to review the results.   Testing/Procedures: None Ordered   Follow-Up: At [facility], you and your health needs are our priority.  As part of our continuing mission to provide you with exceptional heart care, we have created designated Provider Care Teams.  These Care Teams include your primary Cardiologist (physician) and Advanced Practice Providers (APPs -  Physician Assistants and Nurse Practitioners) who all work together to provide you with the care you need, when you need it.  We recommend signing up for the patient portal called "MyChart".  Sign up information is provided on this After Visit Summary.  MyChart is used to connect with patients for Virtual Visits (Telemedicine).  Patients are able to view lab/test results, encounter notes, upcoming appointments, etc.  Non-urgent messages can be sent to your provider as well.   To learn more about what you can do with MyChart, go to [email or web address].    Your next appointment:   1 year(s)  The format for your next appointment:   In Person  Provider:   You may see Candee Furbish, MD or one of the following Advanced Practice Providers on your designated Care Team:    Truitt Merle, NP  Cecilie Kicks, NP  Kathyrn Drown, NP

## 2020-05-14 ENCOUNTER — Telehealth: Payer: Self-pay | Admitting: Internal Medicine

## 2020-05-14 NOTE — Telephone Encounter (Signed)
I have called and spoke with Laura Mendez and she is aware of CY recs.  Nothing further is needed.

## 2020-05-14 NOTE — Telephone Encounter (Signed)
It has just been approved, but isn't packaged, shipped or in retail stores yet. I would wait. If she is triple vaccinated she is in good shape for now.

## 2020-05-14 NOTE — Telephone Encounter (Signed)
Called and spoke with pt and she said that her son asked her to call CY and see about this new covid pill.  paxlovid was listed on the news today and she wanted to see if she needs to keep a prescription on hand just in case.   CY please advise. Thanks

## 2020-05-29 NOTE — Progress Notes (Signed)
Patient ID: Laura Mendez, female    DOB: 1940/11/23, 80 y.o.   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 Fib Labs 11/23/16- allergy profile total IgE 409 on Xolair, elevated specific IgE for dust mite and cockroach. EOS 3,300, Nl ANA and alpha-gal Derm Path report reviewed- Spongiotic/ eczema process- does not describe it as urticaria. Xolair was changed to [facility] June, 2018, to see if Xolair was causing her rash-no improvement after 5 months Office Spirometry 12/20/2017-very severe obstructive airways disease.  FVC 2.09/60%, FEV1 0.90/34%, ratio 0.43 Quantiferon tb Gold 06/22/17- Neg ---------------------------------------------------------------.   11/28/19- 80 year old female former smoker followed for severe asthma/COPD(formerly used Xolair/Nucala), complicated by allergic rhinitis, paroxysmal A. Fib/ Xarelto, rash, breast cancer R,  Xarelto,  neb Duoneb, Wixela 250, ventolin hfa -----having runny nose, denies any other problems Using rescue 0-2x/ day, varies with weather/ season.  Not much sleep disturbance. Still using topical steroid ointment for chronic, idiopathic rash, followed by dermatology. Rash was originally blamed on Xolair. Feels well. .   05/30/20- 80 year old female former smoker followed for severe asthma/COPD(formerly used Xolair/Nucala), complicated by allergic rhinitis, paroxysmal A. Fib/ Xarelto, CVA, ,  rash, breast cancer R,  Xarelto,  neb Duoneb, Wixela 250, ventolin hfa Covid vax-3 Phizer Flu vax- had Blames weather for more short of breath this week. Dry cough/ throat clearing. Dermatology still working on her rash> she is waiting to start Dupixent for this.  CXR 11/28/19- IMPRESSION: Bibasilar scarring. No acute abnormalities.   Review of Systems-see HPI   + = positive Constitutional:   No-   weight loss, night sweats, fevers, chills, fatigue, lassitude. HEENT:   No-   headaches, difficulty swallowing, tooth/dental problems, sore throat,       No-  sneezing, +itching, ear ache,  nasal congestion, +post nasal drip,  CV:  No-   chest pain, orthopnea, PND, swelling in lower extremities, anasarca, dizziness, palpitations Resp:  + shortness of breath with exertion or at rest- unchanged             No-   productive cough,  + non-productive cough,  No-  coughing up of blood.              No-   change in color of mucus.  + wheezing.   Skin:  +rash or lesions. GI:  No-   heartburn, indigestion, abdominal pain, nausea, vomiting, GU:  MS:  +   joint pain or swelling.   Neuro- nothing unusual  Psych:  No- change in mood or affect. No depression or anxiety.  No memory loss  Objective:     General- Alert, Oriented, Affect-appropriate, Distress- none acute, Pleasant, + Overweight Skin- + numerous unremarkable keratoses on her back with mild diffuse erythema suggesting excoriation. Lymphadenopathy- none Head- atraumatic            Eyes- Gross vision intact, PERRLA, conjunctivae clear secretions            Ears- Hearing, canals normal            Nose- Mild turbinate edema, No-Septal dev, mucus, polyps, erosion, perforation             Throat- Mallampati II , mucosa -not red, drainage- none, tonsils- atrophic Neck- flexible , trachea midline, no stridor , thyroid nl, carotid no bruit Chest - symmetrical excursion , unlabored           Heart/CV- RRR +,  no murmur , no gallop  , no rub, nl s1 s2                           - JVD- none , edema- none, stasis changes- none, varices- none           Lung- +clear to P&A/ distant/unlabored, wheeze- none,  cough+ minimal, dullness-none, rub- none           Chest wall-  Abd-  Br/ Gen/ Rectal- Not done, not indicated Extrem- cyanosis- none, clubbing, none, atrophy- none, strength- nl Neuro- grossly intact to observation

## 2020-05-30 ENCOUNTER — Encounter: Payer: Self-pay | Admitting: Internal Medicine

## 2020-05-30 ENCOUNTER — Other Ambulatory Visit: Payer: Self-pay

## 2020-05-30 ENCOUNTER — Ambulatory Visit: Payer: Medicare Other | Admitting: Internal Medicine

## 2020-05-30 DIAGNOSIS — J449 Chronic obstructive pulmonary disease, unspecified: Secondary | ICD-10-CM | POA: Diagnosis not present

## 2020-05-30 DIAGNOSIS — R21 Rash and other nonspecific skin eruption: Secondary | ICD-10-CM | POA: Diagnosis not present

## 2020-05-30 MED ORDER — PREDNISONE 10 MG PO TABS: 10.0000 mg | ORAL_TABLET | Freq: Every day | ORAL | 0 refills | Status: DC

## 2020-05-30 MED ORDER — IPRATROPIUM-ALBUTEROL 0.5-2.5 (3) MG/3ML IN SOLN: 3.0000 mL | Freq: Four times a day (QID) | RESPIRATORY_TRACT | 12 refills | Status: DC | PRN

## 2020-05-30 NOTE — Patient Instructions (Signed)
Scripts sent for prednisone taper and Duoneb neb solution  Order- depo 80    Dx exacerbation COPD  Please call if we can help

## 2020-06-04 ENCOUNTER — Other Ambulatory Visit: Payer: Self-pay | Admitting: Cardiology

## 2020-06-04 NOTE — Telephone Encounter (Signed)
Prescription refill request for Xarelto received.   Indication: A flutter Last office visit: [place], 04/30/2020 Weight: 93 kg  Age: 80 yo Scr: 0.92, 04/09/2020 CrCl: 72 ml/min   Prescription refill sent.

## 2020-07-01 ENCOUNTER — Other Ambulatory Visit: Payer: Self-pay | Admitting: Internal Medicine

## 2020-07-03 DIAGNOSIS — M25561 Pain in right knee: Secondary | ICD-10-CM | POA: Diagnosis not present

## 2020-07-13 NOTE — Assessment & Plan Note (Signed)
Always felt Xolair really helped her breathing. Rash developed after years on Xolair and has persisted depite stopping, so doubt Xolair was the cause. Her dermatologist now wants to treat Dupixent and I think they are waiting for insurance approval. Breathing stayed stable off Xolair.  Now nonspecific exacerbation- doubt infection. Plan- refill neb solution. Script to hold for prednisone as discussed

## 2020-07-13 NOTE — Assessment & Plan Note (Signed)
Eczematoid rash may be mostly dry skin, but she is working with dermatologist.

## 2020-07-22 DIAGNOSIS — L309 Dermatitis, unspecified: Secondary | ICD-10-CM | POA: Diagnosis not present

## 2020-07-22 DIAGNOSIS — Z5181 Encounter for therapeutic drug level monitoring: Secondary | ICD-10-CM | POA: Diagnosis not present

## 2020-07-23 ENCOUNTER — Other Ambulatory Visit: Payer: Self-pay | Admitting: Internal Medicine

## 2020-07-24 ENCOUNTER — Telehealth: Payer: Self-pay | Admitting: Internal Medicine

## 2020-07-24 MED ORDER — FLUTICASONE-SALMETEROL 250-50 MCG/DOSE IN AEPB: 1.0000 | INHALATION_SPRAY | Freq: Two times a day (BID) | RESPIRATORY_TRACT | 10 refills | Status: DC

## 2020-07-24 NOTE — Assessment & Plan Note (Signed)
12/30/2017:Right lumpectomy: IDC with calcifications grade 1, 0.6 cm, ADH, margins negative, ER 100%, PR 90%, HER-2 negative ratio 1.77, Ki-67 1%, T1 be N0 stage Ia Did not need adjuvant radiation because of favorable tumor characteristics and age over 52  Current treatment: Letrozole 2.5 mg daily started August 2019 Letrozole toxicities: Denies any adverse effects letrozole.  Breast cancer surveillance: 1.Breast exam 07/25/2020: Benign 2.Mammogram: 12/01/2020: Benign breast density category B  Return to clinic in 1 year for follow-up

## 2020-07-24 NOTE — Progress Notes (Signed)
Patient Care Team: Hoyt Koch, MD as PCP - General (Internal Medicine) Jerline Pain, MD as PCP - Cardiology (Cardiology) Rolm Bookbinder, MD as Consulting Physician (General Surgery) Nicholas Lose, MD as Consulting Physician (Hematology and Oncology) Gery Pray, MD as Consulting Physician (Radiation Oncology)  DIAGNOSIS:    ICD-10-CM   1. Malignant neoplasm of lower-outer quadrant of right breast of female, estrogen receptor positive ([place])  C50.511    Z17.0     SUMMARY OF ONCOLOGIC HISTORY: Oncology History  Malignant neoplasm of lower-outer quadrant of right breast of female, estrogen receptor positive ([place])  11/10/2017 Initial Diagnosis   Six-month follow-up of right breast masses: Anterior mass unchanged and posterior mass showed spiculation.  Biopsy attempt was made on posterior mass but accidentally the anterior mass was actually biopsied.  It revealed grade 1 invasive ductal carcinoma ER 100%, PR 100%, Ki-67 1%, HER-2 negative ratio 1.77; second attempt to biopsy the posterior mass resulted in the biopsy of an intermediate zone between the 2 masses that revealed ADH and ALH, (posterior mass still needs to be biopsied) T1 a N0 stage I a clinical stage   12/30/2017 Surgery   Right lumpectomy: IDC grade 1, 0.6 cm, ADH, ER 100%, PR 100%, HER-2 negative ratio 1.77, Ki-67 1%, margins negative.  Lymph nodes not evaluated.  T1b N0 stage Ia   12/30/2017 Surgery   Right lumpectomy: IDC with calcifications grade 1, 0.6 cm, ADH, margins negative, ER 100%, PR 90%, HER-2 negative ratio 1.77, Ki-67 1%, T1 be N0 stage Ia    01/05/2018 Genetic Testing   Negative genetic testing on the multicancer panel.  The Multi-Gene Panel offered by [facility] includes sequencing and/or deletion duplication testing of the following 84 genes: AIP, ALK, APC, ATM, AXIN2,BAP1,  BARD1, BLM, BMPR1A, BRCA1, BRCA2, BRIP1, CASR, CDC73, CDH1, CDK4, CDKN1B, CDKN1C, CDKN2A (p14ARF), CDKN2A (p16INK4a), CEBPA,  CHEK2, CTNNA1, DICER1, DIS3L2, EGFR (c.2369C>T, p.Thr790Met variant only), EPCAM (Deletion/duplication testing only), FH, FLCN, GATA2, GPC3, GREM1 (Promoter region deletion/duplication testing only), HOXB13 (c.251G>A, p.Gly84Glu), HRAS, KIT, MAX, MEN1, MET, MITF (c.952G>A, p.Glu318Lys variant only), MLH1, MSH2, MSH3, MSH6, MUTYH, NBN, NF1, NF2, NTHL1, PALB2, PDGFRA, PHOX2B, PMS2, POLD1, POLE, POT1, PRKAR1A, PTCH1, PTEN, RAD50, RAD51C, RAD51D, RB1, RECQL4, RET, RUNX1, SDHAF2, SDHA (sequence changes only), SDHB, SDHC, SDHD, SMAD4, SMARCA4, SMARCB1, SMARCE1, STK11, SUFU, TERC, TERT, TMEM127, TP53, TSC1, TSC2, VHL, WRN and WT1.  The report date is January 05, 2018.   01/11/2018 Cancer Staging   Staging form: Breast, AJCC 8th Edition - Pathologic: Stage IA (pT1b, pN0, cM0, G1, ER+, PR+, HER2-) - Signed by Gardenia Phlegm, NP on 01/11/2018   01/12/2018 -  Anti-estrogen oral therapy   Letrozole 2.5 mg daily     CHIEF COMPLIANT: Follow-up of right breast cancer letrozole therapy  INTERVAL HISTORY: Laura Mendez is a 80 y.o. with above-mentioned history of right breast cancer who underwent a lumpectomy and is currently on anti-estrogen therapy with letrozole.Mammogram on 11/27/19 showed no evidence of malignancy bilaterally. She presents to the clinictoday for annual follow-up.  ALLERGIES:  is allergic to clarithromycin, ibuprofen, codeine, codeine phosphate, diltiazem, tape, tapentadol, and verapamil hcl er.  MEDICATIONS:  Current Outpatient Medications  Medication Sig Dispense Refill  . albuterol (VENTOLIN HFA) 108 (90 Base) MCG/ACT inhaler INHALE 2 PUFFS BY MOUTH EVERY 4 HOURS AS NEEDED 18 g 3  . cholecalciferol (VITAMIN D) 1000 units tablet Take 1,000 Units by mouth daily.    . clobetasol cream (TEMOVATE) 1.28 % Apply 1 application topically as  needed.    . fish oil-omega-3 fatty acids 1000 MG capsule Take 1 g by mouth daily.    . Fluticasone-Salmeterol (WIXELA INHUB) 250-50 MCG/DOSE  AEPB Inhale 1 puff into the lungs 2 (two) times daily. 60 each 10  . ipratropium-albuterol (DUONEB) 0.5-2.5 (3) MG/3ML SOLN Take 3 mLs by nebulization every 6 (six) hours as needed. 75 mL 12  . letrozole (FEMARA) 2.5 MG tablet Take 1 tablet (2.5 mg total) by mouth daily. 90 tablet 3  . MEGARED OMEGA-3 KRILL OIL 500 MG CAPS Take 500 mg by mouth daily.     . Multiple Vitamin (MULTIVITAMIN) tablet Take 1 tablet by mouth daily.    . predniSONE (DELTASONE) 10 MG tablet Take 1 tablet (10 mg total) by mouth daily with breakfast. 20 tablet 0  . triamcinolone ointment (KENALOG) 0.1 % Apply 1 application topically 2 (two) times daily as needed (for itching).    Alveda Reasons 20 MG TABS tablet TAKE 1 TABLET BY MOUTH ONCE DAILY WITH  SUPPER 90 tablet 1   Current Facility-Administered Medications  Medication Dose Route Frequency Provider Last Rate Last Admin  . Mepolizumab SOLR 100 mg  100 mg Subcutaneous Q28 days Baird Lyons D, MD   100 mg at 12/20/17 1455    PHYSICAL EXAMINATION: ECOG PERFORMANCE STATUS: 1 - Symptomatic but completely ambulatory  There were no vitals filed for this visit. There were no vitals filed for this visit.  BREAST: No palpable masses or nodules in either right or left breasts. No palpable axillary supraclavicular or infraclavicular adenopathy no breast tenderness or nipple discharge. (exam performed in the presence of a chaperone)  LABORATORY DATA:  I have reviewed the data as listed CMP Latest Ref Rng & Units 04/09/2020 02/26/2019 12/22/2017  Glucose 70 - 99 mg/dL 141(H) 99 89  BUN 6 - 23 mg/dL _0 Creatinine 0.40 - 1.20 mg/dL 0.92 0.97 0.81  Sodium 135 - 145 mEq/L 142 143 141  Potassium 3.5 - 5.1 mEq/L 3.8 5.3(H) 3.9  Chloride 96 - 112 mEq/L 103 106 105  CO2 19 - 32 mEq/L _1 Calcium 8.4 - 10.5 mg/dL 9.6 10.4 10.0  Total Protein 6.0 - 8.3 g/dL 7.2 7.5 -  Total Bilirubin 0.2 - 1.2 mg/dL 0.5 0.6 -  Alkaline Phos 39 - 117 U/L 57 72 -  AST 0 - 37 U/L 23 26 -   ALT 0 - 35 U/L 18 19 -    Lab Results  Component Value Date   WBC 6.0 04/09/2020   HGB 14.3 04/09/2020   HCT 43.6 04/09/2020   MCV 90.4 04/09/2020   PLT 248.0 04/09/2020   NEUTROABS 5.5 11/16/2017    ASSESSMENT & PLAN:  Malignant neoplasm of lower-outer quadrant of right breast of female, estrogen receptor positive ([place]) 12/30/2017:Right lumpectomy: IDC with calcifications grade 1, 0.6 cm, ADH, margins negative, ER 100%, PR 90%, HER-2 negative ratio 1.77, Ki-67 1%, T1 be N0 stage Ia Did not need adjuvant radiation because of favorable tumor characteristics and age over 89  Current treatment: Letrozole 2.5 mg daily started August 2019 Letrozole toxicities: Denies any adverse effects letrozole.  Breast cancer surveillance: 1.Breast exam 07/25/2020: Benign 2.Mammogram: 12/01/2020: Benign breast density category B  Return to clinic in 1 year for follow-up    No orders of the defined types were placed in this encounter.  The patient has a good understanding of the overall plan. she agrees with it. she will call with any problems that may  develop before the next visit here.  Total time spent: 20 mins including face to face time and time spent for planning, charting and coordination of care  Rulon Eisenmenger, MD, MPH 07/25/2020  I, Cloyde Reams Dorshimer, am acting as scribe for Dr. Nicholas Lose.  I have reviewed the above documentation for accuracy and completeness, and I agree with the above.

## 2020-07-24 NOTE — Telephone Encounter (Signed)
Spoke with patient. She stated that her Wixela inhaler RX was denied yesterday. She was calling to see why. I reviewed her chart and advised her that it appears it was denied by accident as CY mentioned in her last OV that she was stable on Wixela.  She is aware that I have sent her new RX to [facility] on [address]. Advised her to call us back if she has any problems at the pharmacy.   Nothing further needed at time of call.

## 2020-07-25 ENCOUNTER — Other Ambulatory Visit: Payer: Self-pay

## 2020-07-25 ENCOUNTER — Inpatient Hospital Stay: Payer: Medicare Other | Attending: Hematology and Oncology | Admitting: Hematology and Oncology

## 2020-07-25 DIAGNOSIS — Z79811 Long term (current) use of aromatase inhibitors: Secondary | ICD-10-CM | POA: Diagnosis not present

## 2020-07-25 DIAGNOSIS — Z79899 Other long term (current) drug therapy: Secondary | ICD-10-CM | POA: Insufficient documentation

## 2020-07-25 DIAGNOSIS — C50511 Malignant neoplasm of lower-outer quadrant of right female breast: Secondary | ICD-10-CM | POA: Insufficient documentation

## 2020-07-25 DIAGNOSIS — Z170 Estrogen receptor positive status [ER+]: Secondary | ICD-10-CM | POA: Diagnosis not present

## 2020-07-25 DIAGNOSIS — Z7952 Long term (current) use of systemic steroids: Secondary | ICD-10-CM | POA: Diagnosis not present

## 2020-07-25 DIAGNOSIS — Z7901 Long term (current) use of anticoagulants: Secondary | ICD-10-CM | POA: Insufficient documentation

## 2020-07-25 MED ORDER — DUPIXENT 100 MG/0.67ML ~~LOC~~ SOSY: 1.0000 | PREFILLED_SYRINGE | SUBCUTANEOUS | Status: DC

## 2020-07-28 ENCOUNTER — Encounter: Payer: Self-pay | Admitting: Internal Medicine

## 2020-07-28 ENCOUNTER — Other Ambulatory Visit: Payer: Self-pay

## 2020-07-28 ENCOUNTER — Ambulatory Visit (INDEPENDENT_AMBULATORY_CARE_PROVIDER_SITE_OTHER): Payer: Medicare Other | Admitting: Internal Medicine

## 2020-07-28 VITALS — BP 122/88 | HR 65 | Temp 97.6°F | Resp 18 | Ht 70.0 in | Wt 209.0 lb

## 2020-07-28 DIAGNOSIS — R300 Dysuria: Secondary | ICD-10-CM | POA: Diagnosis not present

## 2020-07-28 DIAGNOSIS — M2550 Pain in unspecified joint: Secondary | ICD-10-CM | POA: Insufficient documentation

## 2020-07-28 LAB — URINALYSIS, ROUTINE W REFLEX MICROSCOPIC
Bilirubin Urine: NEGATIVE
Hgb urine dipstick: NEGATIVE
Ketones, ur: NEGATIVE
Leukocytes,Ua: NEGATIVE
Nitrite: NEGATIVE
RBC / HPF: NONE SEEN (ref 0–?)
Specific Gravity, Urine: 1.015 (ref 1.000–1.030)
Total Protein, Urine: NEGATIVE
Urine Glucose: NEGATIVE
Urobilinogen, UA: 0.2 (ref 0.0–1.0)
pH: 7.5 (ref 5.0–8.0)

## 2020-07-28 NOTE — Progress Notes (Signed)
   Subjective:   Patient ID: Laura Mendez, female    DOB: [birth year], 80 y.o.   MRN: [id number]  HPI The patient is a 80 YO female coming in for concerns about left ankle and foot swelling. Started dupixant beginning of march and this is new since that time. She is having pain in the knees and the left ankle pain. Is taking her xarelto 20 mg daily without missing recently. Denies other changes to medications. No fall or injury recently. Denies fevers or chills. Also thinks she may have early UTI and wants urine checked if possible.   Review of Systems  Constitutional: Negative.   HENT: Negative.   Eyes: Negative.   Respiratory: Negative for cough, chest tightness and shortness of breath.   Cardiovascular: Negative for chest pain, palpitations and leg swelling.  Gastrointestinal: Negative for abdominal distention, abdominal pain, constipation, diarrhea, nausea and vomiting.  Musculoskeletal: Positive for arthralgias, joint swelling and myalgias.  Skin: Negative.   Neurological: Negative.   Psychiatric/Behavioral: Negative.     Objective:  Physical Exam Constitutional:      Appearance: She is well-developed and well-nourished.  HENT:     Head: Normocephalic and atraumatic.  Eyes:     Extraocular Movements: EOM normal.  Cardiovascular:     Rate and Rhythm: Normal rate and regular rhythm.  Pulmonary:     Effort: Pulmonary effort is normal. No respiratory distress.     Breath sounds: Normal breath sounds. No wheezing or rales.  Abdominal:     General: Bowel sounds are normal. There is no distension.     Palpations: Abdomen is soft.     Tenderness: There is no abdominal tenderness. There is no rebound.  Musculoskeletal:        General: Tenderness present. No edema.     Cervical back: Normal range of motion.     Comments: Left ankle edema 1+ non-pitting, no left calf tenderness or significant swelling, left and right knee no significant joint effusion.   Skin:    General:  Skin is warm and dry.  Neurological:     Mental Status: She is alert and oriented to person, place, and time.     Coordination: Coordination normal.  Psychiatric:        Mood and Affect: Mood and affect normal.     Vitals:   07/28/20 1007  BP: 122/88  Pulse: 65  Resp: 18  Temp: 97.6 F (36.4 C)  TempSrc: Oral  SpO2: 95%  Weight: 209 lb (94.8 kg)  Height: 5\' 10"  (1.778 m)    This visit occurred during the SARS-CoV-2 public health emergency.  Safety protocols were in place, including screening questions prior to the visit, additional usage of staff PPE, and extensive cleaning of exam room while observing appropriate contact time as indicated for disinfecting solutions.   Assessment & Plan:

## 2020-07-28 NOTE — Patient Instructions (Signed)
We will check the urine today for infection.

## 2020-07-28 NOTE — Assessment & Plan Note (Signed)
Checking U/A and treat as appropriate.

## 2020-07-28 NOTE — Assessment & Plan Note (Signed)
We discussed how dupixant could be the cause of this. We did discuss how typically this does go away either with or without stopping medication. She would like to stop since this has not helped with her rash which was the intended function. Since she is on xarelto for her A fib it would be extremely unlikely to be DVT and she has not clinical signs of this on exam.

## 2020-07-29 ENCOUNTER — Telehealth: Payer: Self-pay | Admitting: Internal Medicine

## 2020-07-29 DIAGNOSIS — L308 Other specified dermatitis: Secondary | ICD-10-CM | POA: Diagnosis not present

## 2020-07-29 DIAGNOSIS — L309 Dermatitis, unspecified: Secondary | ICD-10-CM | POA: Diagnosis not present

## 2020-07-29 NOTE — Telephone Encounter (Signed)
Patient is requesting a call back in regards to recent lab work. She can be reached at [phone]

## 2020-07-29 NOTE — Telephone Encounter (Signed)
See result note dated 07/29/2020.

## 2020-08-05 DIAGNOSIS — L400 Psoriasis vulgaris: Secondary | ICD-10-CM | POA: Diagnosis not present

## 2020-08-08 ENCOUNTER — Encounter (HOSPITAL_COMMUNITY): Payer: Self-pay | Admitting: Emergency Medicine

## 2020-08-08 ENCOUNTER — Other Ambulatory Visit: Payer: Self-pay

## 2020-08-08 ENCOUNTER — Ambulatory Visit (HOSPITAL_COMMUNITY)
Admission: EM | Admit: 2020-08-08 | Discharge: 2020-08-08 | Disposition: A | Discharge status: Home / Self Care | Payer: Medicare Other

## 2020-08-08 DIAGNOSIS — M79601 Pain in right arm: Secondary | ICD-10-CM

## 2020-08-08 DIAGNOSIS — S4991XA Unspecified injury of right shoulder and upper arm, initial encounter: Secondary | ICD-10-CM | POA: Diagnosis not present

## 2020-08-08 DIAGNOSIS — W19XXXA Unspecified fall, initial encounter: Secondary | ICD-10-CM

## 2020-08-08 MED ORDER — BACITRACIN ZINC 500 UNIT/GM EX OINT
TOPICAL_OINTMENT | CUTANEOUS | Status: AC
  Filled 2020-08-08: qty 0.9

## 2020-08-08 NOTE — Discharge Instructions (Signed)
Change the dressing to your arm tomorrow.  Clean with warm soapy water and pat dry. Do not rub it.   You can use antibacterial lotion.   Return or go to the Emergency Department if symptoms worsen or do not improve in the next few days.

## 2020-08-08 NOTE — ED Provider Notes (Signed)
[place]    CSN: [id number] Arrival date & time: 08/08/20  1243      History   Chief Complaint Chief Complaint  Patient presents with  . Arm Injury    HPI Sabreena Vogan is a 80 y.o. female.   Tehya Leath is 80 year old with complaint of right arm injury.  Reports door knocked into arm.  Reports bleeding and bruising initially but bleeding is now controlled.  Denies any pain, numbness or tingling.  Does take blood thinners.   Denies any fevers, chest pain, shortness of breath, N/V/D, abdominal pain, or headaches.    ROS: As per HPI, all other pertinent ROS negative   The history is provided by the patient.  Arm Injury Location:  Arm Arm location:  R forearm   Past Medical History:  Diagnosis Date  . Asthma   . Chronic airway obstruction, not elsewhere classified   . Diastolic dysfunction without heart failure   . Disorder of bone and cartilage, unspecified   . Dysrhythmia    A-Fib  . Family history of colon cancer   . Family history of kidney cancer   . Family history of ovarian cancer   . Paroxysmal atrial flutter ([place])   . Stroke [facility])    TIA  . TIA (transient ischemic attack)   . Unspecified asthma(493.90)     Patient Active Problem List   Diagnosis Date Noted  . Arthralgia 07/28/2020  . Dysuria 07/28/2020  . Abnormal laboratory test 04/10/2020  . Genetic testing 01/05/2018  . Malignant neoplasm of lower-outer quadrant of right breast of female, estrogen receptor positive ([place]) 11/15/2017  . Diastolic dysfunction 31/51/7616  . Rash and nonspecific skin eruption 04/01/2016  . Paroxysmal atrial flutter ([place]) 01/13/2016  . Routine general medical examination at a health care facility 01/13/2016  . Retinal vein occlusion 06/16/2015  . Seasonal and perennial allergic rhinitis 08/06/2011  . Asthma with COPD ([place]) 08/04/2007  . Osteopenia 03/31/2007    Past Surgical History:  Procedure Laterality Date  . ABDOMINAL  HYSTERECTOMY    . BREAST LUMPECTOMY Right 12/30/2017  . BREAST LUMPECTOMY WITH RADIOACTIVE SEED LOCALIZATION Right 12/30/2017   Procedure: BREAST LUMPECTOMY WITH RADIOACTIVE SEED LOCALIZATION X'S 3;  Surgeon: Rolm Bookbinder, MD;  Location: [place];  Service: General;  Laterality: Right;  . BUNIONECTOMY Bilateral   . EYE SURGERY Bilateral    cataract removal    OB History   No obstetric history on file.      Home Medications    Prior to Admission medications   Medication Sig Start Date End Date Taking? Authorizing Provider  albuterol (VENTOLIN HFA) 108 (90 Base) MCG/ACT inhaler INHALE 2 PUFFS BY MOUTH EVERY 4 HOURS AS NEEDED 07/02/20   Baird Lyons D, MD  cholecalciferol (VITAMIN D) 1000 units tablet Take 1,000 Units by mouth daily.    [provider]  clobetasol cream (TEMOVATE) 0.73 % Apply 1 application topically as needed.    [provider]  DUPIXENT 300 MG/2ML SOPN Inject into the skin every 14 (fourteen) days. 05/07/20   [provider]  fish oil-omega-3 fatty acids 1000 MG capsule Take 1 g by mouth daily.    [provider]  Fluticasone-Salmeterol (WIXELA INHUB) 250-50 MCG/DOSE AEPB Inhale 1 puff into the lungs 2 (two) times daily. 07/24/20   Baird Lyons D, MD  ipratropium-albuterol (DUONEB) 0.5-2.5 (3) MG/3ML SOLN Take 3 mLs by nebulization every 6 (six) hours as needed. 05/30/20   Deneise Lever,  MD  letrozole (FEMARA) 2.5 MG tablet Take 1 tablet (2.5 mg total) by mouth daily. 07/26/19   Nicholas Lose, MD  MEGARED OMEGA-3 KRILL OIL 500 MG CAPS Take 500 mg by mouth daily.     [provider]  Multiple Vitamin (MULTIVITAMIN) tablet Take 1 tablet by mouth daily.    [provider]  triamcinolone ointment (KENALOG) 0.1 % Apply 1 application topically 2 (two) times daily as needed (for itching).    [provider]  XARELTO 20 MG TABS tablet TAKE 1 TABLET BY MOUTH ONCE DAILY WITH  SUPPER 06/04/20   Jerline Pain, MD     Family History Family History  Problem Relation Age of Onset  . COPD Father   . Colon cancer Mother 38       mets to pancreas and liver  . Kidney cancer Sister 59       d. 4  . Lung cancer Sister        d. 15, smoker  . Ovarian cancer Sister 9       d. 64  . Cirrhosis Sister        d. 50  . Melanoma Sister 87  . Ovarian cancer Maternal Grandmother        d. 58  . Heart disease Maternal Grandfather        rheumatic heart disease  . Rectal cancer Paternal Grandfather        d. 65  . Parkinson's disease Brother   . Ovarian cancer Maternal Aunt   . Colon cancer Maternal Uncle        d. 72-73  . Heart attack Paternal Uncle        d. 36  . Kidney cancer Brother 47  . Colon cancer Maternal Uncle        d. 64  . Cancer Other        MGMs pat 1/2 sister with uterine and rectal cancer  . Colon cancer Cousin        d. 78 - mat first cousin  . Breast cancer Neg Hx     Social History Social History   Tobacco Use  . Smoking status: Former Smoker    Years: 0.00    Types: Cigarettes    Quit date: 05/24/1977    Years since quitting: 43.2  . Smokeless tobacco: Never Used  . Tobacco comment: 7/7 pt states she occasionally smoked, let cigs "burn" more than she smoked them  Vaping Use  . Vaping Use: Never used  Substance Use Topics  . Alcohol use: No  . Drug use: No     Allergies   Clarithromycin, Ibuprofen, Codeine, Codeine phosphate, Diltiazem, Tape, Tapentadol, and Verapamil hcl er   Review of Systems Review of Systems  Skin: Positive for wound.     Physical Exam Triage Vital Signs ED Triage Vitals  Enc Vitals Group     BP 08/08/20 1307 131/67     Pulse Rate 08/08/20 1307 66     Resp 08/08/20 1307 15     Temp 08/08/20 1307 98 F (36.7 C)     Temp Source 08/08/20 1307 Oral     SpO2 08/08/20 1307 96 %     Weight --      Height --      Head Circumference --      Peak Flow --      Pain Score 08/08/20 1306 0     Pain Loc --      Pain  Edu? --       Excl. in [place]? --    No data found.  Updated Vital Signs BP 131/67 (BP Location: Left Arm)   Pulse 66   Temp 98 F (36.7 C) (Oral)   Resp 15   SpO2 96%   Visual Acuity Right Eye Distance:   Left Eye Distance:   Bilateral Distance:    Right Eye Near:   Left Eye Near:    Bilateral Near:     Physical Exam Vitals and nursing note reviewed.  Constitutional:      General: She is not in acute distress.    Appearance: Normal appearance. She is not ill-appearing, toxic-appearing or diaphoretic.  HENT:     Head: Normocephalic and atraumatic.  Eyes:     Conjunctiva/sclera: Conjunctivae normal.  Cardiovascular:     Rate and Rhythm: Normal rate.     Pulses: Normal pulses.  Pulmonary:     Effort: Pulmonary effort is normal.  Abdominal:     General: Abdomen is flat.  Musculoskeletal:        General: Normal range of motion.     Cervical back: Normal range of motion.  Skin:    General: Skin is warm and dry.     Findings: Bruising and laceration (skin tear to right forearm, bleeding controlled, bruising around wound) present.  Neurological:     General: No focal deficit present.     Mental Status: She is alert and oriented to person, place, and time.  Psychiatric:        Mood and Affect: Mood normal.      UC Treatments / Results  Labs (all labs ordered are listed, but only abnormal results are displayed) Labs Reviewed - No data to display  EKG   Radiology No results found.  Procedures Procedures (including critical care time)  Medications Ordered in UC Medications - No data to display  Initial Impression / Assessment and Plan / UC Course  I have reviewed the triage vital signs and the nursing notes.  Pertinent labs & imaging results that were available during my care of the patient were reviewed by me and considered in my medical decision making (see chart for details).     Injury of right arm Fall Assessment negative for red flags or concerns.   Wound care  to right forearm skin tear completed by NP Tamala Julian.  Wound cleaned with shur-clens.  Antibiotic ointment applied.  Nonadherent dressing and coban applied.  Patient tolerated.   Keep wound clean with warm soapy water.  Apply new dressing daily as needed.   Final Clinical Impressions(s) / UC Diagnoses   Final diagnoses:  Injury of right upper extremity, initial encounter  Right arm pain  Fall, initial encounter     Discharge Instructions     Change the dressing to your arm tomorrow.  Clean with warm soapy water and pat dry. Do not rub it.   You can use antibacterial lotion.   Return or go to the Emergency Department if symptoms worsen or do not improve in the next few days.      ED Prescriptions    None     PDMP not reviewed this encounter.   Pearson [place], NP 08/08/20 1336

## 2020-08-08 NOTE — ED Triage Notes (Signed)
Patient c/o RT arm skin tear that happened today.   Patient states "I was at the bookstore when my arm hit the door and my arm started bleeding".   Patient states she was able to control the bleeding.   Patient is currently on blood thinners.

## 2020-08-12 ENCOUNTER — Ambulatory Visit (HOSPITAL_COMMUNITY)
Admission: EM | Admit: 2020-08-12 | Discharge: 2020-08-12 | Disposition: A | Discharge status: Home / Self Care | Payer: Medicare Other | Attending: Family Medicine | Admitting: Family Medicine

## 2020-08-12 ENCOUNTER — Encounter (HOSPITAL_COMMUNITY): Payer: Self-pay | Admitting: Emergency Medicine

## 2020-08-12 ENCOUNTER — Other Ambulatory Visit: Payer: Self-pay

## 2020-08-12 DIAGNOSIS — S41111A Laceration without foreign body of right upper arm, initial encounter: Secondary | ICD-10-CM

## 2020-08-12 NOTE — ED Triage Notes (Addendum)
Pt presents today with skin tear to right forearm. She reports hitting it on door at book store on 08/08/20. She has been changing bandage daily and applying Neosporin, but is requesting an antibiotic today. +anticoagulant

## 2020-08-13 ENCOUNTER — Ambulatory Visit: Payer: Medicare Other | Admitting: Internal Medicine

## 2020-08-13 NOTE — ED Provider Notes (Signed)
[place]   [id number] 08/12/20 Arrival Time: 3762  ASSESSMENT & PLAN:  1. Skin tear of right upper extremity    No signs of infection. Continue current wound care. Wound does look good; reassured.    Follow-up Information    Hoyt Koch, MD.   Specialty: Internal Medicine Why: As needed. Contact information: [place] [address] [phone]        [facility].   Specialty: Wound Care Why: If worsening or failing to improve as anticipated. Contact information: [place], [place] [id number] [place] [phone]             Reviewed expectations re: course of current medical issues. Questions answered. Outlined signs and symptoms indicating need for more acute intervention. Patient verbalized understanding. After Visit Summary given.   SUBJECTIVE:  Laura Mendez is a 80 y.o. female who presents with a skin tear of RUE; seen here on 08/08/20; note reviewed. Here for wound check. No significant pain. Without bleeding. Applying Neosporin.  OBJECTIVE:  Vitals:   08/12/20 1355  BP: (!) 143/82  Pulse: 74  Temp: 98.5 F (36.9 C)  TempSrc: Oral  SpO2: 93%     General appearance: alert; no distress Skin: skin tear of RUE; with surrounding bruising; without drainage or bleeding; no signs of infection Psychological: alert and cooperative; normal mood and affect    Labs Reviewed - No data to display  No results found.  Allergies  Allergen Reactions  . Clarithromycin Anaphylaxis    "just about killed me"  . Ibuprofen Hives  . Codeine Nausea Only  . Codeine Phosphate Nausea Only  . Diltiazem Rash  . Tape Itching and Other (See Comments)    Tears skin  . Tapentadol Itching    Other reaction(s): Other (See Comments) Tears skin  . Verapamil Hcl Er Rash    Past Medical History:  Diagnosis Date  . Asthma   . Chronic airway obstruction, not  elsewhere classified   . Diastolic dysfunction without heart failure   . Disorder of bone and cartilage, unspecified   . Dysrhythmia    A-Fib  . Family history of colon cancer   . Family history of kidney cancer   . Family history of ovarian cancer   . Paroxysmal atrial flutter ([employer])   . Stroke [facility])    TIA  . TIA (transient ischemic attack)   . Unspecified asthma(493.90)    Social History   Socioeconomic History  . Marital status: Divorced    Spouse name: Not on file  . Number of children: Not on file  . Years of education: Not on file  . Highest education level: Not on file  Occupational History  . Occupation: retired  Tobacco Use  . Smoking status: Former Smoker    Years: 0.00    Types: Cigarettes    Quit date: 05/24/1977    Years since quitting: 43.2  . Smokeless tobacco: Never Used  . Tobacco comment: 7/7 pt states she occasionally smoked, let cigs "burn" more than she smoked them  Vaping Use  . Vaping Use: Never used  Substance and Sexual Activity  . Alcohol use: No  . Drug use: No  . Sexual activity: Not on file  Other Topics Concern  . Not on file  Social History Narrative  . Not on file   Social Determinants of Health   Financial Resource Strain: Not on file  Food Insecurity: Not on  file  Transportation Needs: Not on file  Physical Activity: Not on file  Stress: Not on file  Social Connections: Not on file         Vanessa Kick, MD 08/13/20 [phone]

## 2020-08-20 ENCOUNTER — Telehealth: Payer: Self-pay | Admitting: Internal Medicine

## 2020-08-20 NOTE — Telephone Encounter (Signed)
She is eligible. If she tolerated the earlier vaccine injections ok, then it would be reasonable to go ahead and get the fourth one now.

## 2020-08-20 NOTE — Telephone Encounter (Signed)
Pt had the Pfizer vaccine: 06/19/19, 07/10/19, and booster on 03/18/20.  Pt is wanting to know if it is recommended for her to get a fourth vaccine. Dr. Annamaria Boots, please advise.  Allergies  Allergen Reactions  . Clarithromycin Anaphylaxis    "just about killed me"  . Ibuprofen Hives  . Codeine Nausea Only  . Codeine Phosphate Nausea Only  . Diltiazem Rash  . Tape Itching and Other (See Comments)    Tears skin  . Tapentadol Itching    Other reaction(s): Other (See Comments) Tears skin  . Verapamil Hcl Er Rash     Current Outpatient Medications:  .  albuterol (VENTOLIN HFA) 108 (90 Base) MCG/ACT inhaler, INHALE 2 PUFFS BY MOUTH EVERY 4 HOURS AS NEEDED, Disp: 18 g, Rfl: 3 .  cholecalciferol (VITAMIN D) 1000 units tablet, Take 1,000 Units by mouth daily., Disp: , Rfl:  .  clobetasol cream (TEMOVATE) 1.49 %, Apply 1 application topically as needed., Disp: , Rfl:  .  DUPIXENT 300 MG/2ML SOPN, Inject into the skin every 14 (fourteen) days., Disp: , Rfl:  .  fish oil-omega-3 fatty acids 1000 MG capsule, Take 1 g by mouth daily., Disp: , Rfl:  .  Fluticasone-Salmeterol (WIXELA INHUB) 250-50 MCG/DOSE AEPB, Inhale 1 puff into the lungs 2 (two) times daily., Disp: 60 each, Rfl: 10 .  ipratropium-albuterol (DUONEB) 0.5-2.5 (3) MG/3ML SOLN, Take 3 mLs by nebulization every 6 (six) hours as needed., Disp: 75 mL, Rfl: 12 .  letrozole (FEMARA) 2.5 MG tablet, Take 1 tablet (2.5 mg total) by mouth daily., Disp: 90 tablet, Rfl: 3 .  MEGARED OMEGA-3 KRILL OIL 500 MG CAPS, Take 500 mg by mouth daily. , Disp: , Rfl:  .  Multiple Vitamin (MULTIVITAMIN) tablet, Take 1 tablet by mouth daily., Disp: , Rfl:  .  triamcinolone ointment (KENALOG) 0.1 %, Apply 1 application topically 2 (two) times daily as needed (for itching)., Disp: , Rfl:  .  XARELTO 20 MG TABS tablet, TAKE 1 TABLET BY MOUTH ONCE DAILY WITH  SUPPER, Disp: 90 tablet, Rfl: 1  Current Facility-Administered Medications:  Marland Kitchen  Mepolizumab SOLR 100 mg,  100 mg, Subcutaneous, Q28 days, Young, Clinton D, MD, 100 mg at 12/20/17 1455

## 2020-08-21 ENCOUNTER — Other Ambulatory Visit: Payer: Self-pay | Admitting: Hematology and Oncology

## 2020-08-21 NOTE — Telephone Encounter (Signed)
Called and spoke with pt letting her know the info stated by CY that he said it is okay for her to get 4th covid vaccine as long as she tolerated the other injections well. Pt verbalized understanding. Nothing further needed.

## 2020-09-01 DIAGNOSIS — M25561 Pain in right knee: Secondary | ICD-10-CM | POA: Diagnosis not present

## 2020-09-02 DIAGNOSIS — L400 Psoriasis vulgaris: Secondary | ICD-10-CM | POA: Diagnosis not present

## 2020-10-15 DIAGNOSIS — L308 Other specified dermatitis: Secondary | ICD-10-CM | POA: Diagnosis not present

## 2020-10-21 DIAGNOSIS — Z79899 Other long term (current) drug therapy: Secondary | ICD-10-CM | POA: Diagnosis not present

## 2020-10-24 DIAGNOSIS — Z79899 Other long term (current) drug therapy: Secondary | ICD-10-CM | POA: Diagnosis not present

## 2020-10-28 DIAGNOSIS — L400 Psoriasis vulgaris: Secondary | ICD-10-CM | POA: Diagnosis not present

## 2020-11-04 ENCOUNTER — Encounter: Payer: Self-pay | Admitting: Internal Medicine

## 2020-11-04 ENCOUNTER — Other Ambulatory Visit: Payer: Self-pay

## 2020-11-04 ENCOUNTER — Ambulatory Visit (INDEPENDENT_AMBULATORY_CARE_PROVIDER_SITE_OTHER): Payer: Medicare Other | Admitting: Internal Medicine

## 2020-11-04 VITALS — BP 130/88 | HR 74 | Temp 98.7°F | Ht 70.5 in | Wt 208.8 lb

## 2020-11-04 DIAGNOSIS — I4892 Unspecified atrial flutter: Secondary | ICD-10-CM | POA: Diagnosis not present

## 2020-11-04 DIAGNOSIS — R21 Rash and other nonspecific skin eruption: Secondary | ICD-10-CM | POA: Diagnosis not present

## 2020-11-04 DIAGNOSIS — Z170 Estrogen receptor positive status [ER+]: Secondary | ICD-10-CM | POA: Diagnosis not present

## 2020-11-04 DIAGNOSIS — Z0000 Encounter for general adult medical examination without abnormal findings: Secondary | ICD-10-CM | POA: Diagnosis not present

## 2020-11-04 DIAGNOSIS — C50511 Malignant neoplasm of lower-outer quadrant of right female breast: Secondary | ICD-10-CM

## 2020-11-04 NOTE — Patient Instructions (Addendum)
You can go up to 4 pills a day on the zyrtec for itching.  You can get shingrix at the pharmacy.

## 2020-11-04 NOTE — Assessment & Plan Note (Signed)
Still taking femara.

## 2020-11-04 NOTE — Progress Notes (Signed)
Subjective:   Patient ID: Laura Mendez, female    DOB: [birth year], 80 y.o.   MRN: [id number]  HPI Here for medicare wellness and physical, no new complaints. Please see A/P for status and treatment of chronic medical problems.   Diet: heart healthy Physical activity: sedentary Depression/mood screen: negative Hearing: intact to whispered voice Visual acuity: grossly normal, performs annual eye exam  ADLs: capable Fall risk: none Home safety: good Cognitive evaluation: intact to orientation, naming, recall and repetition EOL planning: adv directives discussed, in place  [employer] Visit from 11/04/2020 in [place] at [facility] Total Score 0       I have personally reviewed and have noted 1. The patient's medical and social history - reviewed today no changes 2. Their use of alcohol, tobacco or illicit drugs 3. Their current medications and supplements 4. The patient's functional ability including ADL's, fall risks, home safety risks and hearing or visual impairment. 5. Diet and physical activities 6. Evidence for depression or mood disorders 7. Care team reviewed and updated 8.  The patient is not on an opioid pain medication.  Patient Care Team: Hoyt Koch, MD as PCP - General (Internal Medicine) Jerline Pain, MD as PCP - Cardiology (Cardiology) Rolm Bookbinder, MD as Consulting Physician (General Surgery) Nicholas Lose, MD as Consulting Physician (Hematology and Oncology) Gery Pray, MD as Consulting Physician (Radiation Oncology) Past Medical History:  Diagnosis Date   Asthma    Chronic airway obstruction, not elsewhere classified    Diastolic dysfunction without heart failure    Disorder of bone and cartilage, unspecified    Dysrhythmia    A-Fib   Family history of colon cancer    Family history of kidney cancer    Family history of ovarian cancer    Paroxysmal atrial flutter ([facility])    Stroke [facility])    TIA    TIA (transient ischemic attack)    Unspecified asthma(493.90)    Past Surgical History:  Procedure Laterality Date   ABDOMINAL HYSTERECTOMY     BREAST LUMPECTOMY Right 12/30/2017   BREAST LUMPECTOMY WITH RADIOACTIVE SEED LOCALIZATION Right 12/30/2017   Procedure: BREAST LUMPECTOMY WITH RADIOACTIVE SEED LOCALIZATION X'S 3;  Surgeon: Rolm Bookbinder, MD;  Location: [place];  Service: General;  Laterality: Right;   BUNIONECTOMY Bilateral    EYE SURGERY Bilateral    cataract removal   Family History  Problem Relation Age of Onset   COPD Father    Colon cancer Mother 30       mets to pancreas and liver   Kidney cancer Sister 10       d. 34   Lung cancer Sister        d. 45, smoker   Ovarian cancer Sister 73       d. 72   Cirrhosis Sister        d. 58   Melanoma Sister 7   Ovarian cancer Maternal Grandmother        d. 93   Heart disease Maternal Grandfather        rheumatic heart disease   Rectal cancer Paternal Grandfather        d. 76   Parkinson's disease Brother    Ovarian cancer Maternal Aunt    Colon cancer Maternal Uncle        d. 72-73   Heart attack Paternal Uncle        d. 63   Kidney cancer Brother 64  Colon cancer Maternal Uncle        d. 41   Cancer Other        MGMs pat 1/2 sister with uterine and rectal cancer   Colon cancer Cousin        d. 35 - mat first cousin   Breast cancer Neg Hx     Review of Systems  Constitutional: Negative.   HENT: Negative.    Eyes: Negative.   Respiratory:  Negative for cough, chest tightness and shortness of breath.   Cardiovascular:  Negative for chest pain, palpitations and leg swelling.  Gastrointestinal:  Negative for abdominal distention, abdominal pain, constipation, diarrhea, nausea and vomiting.  Musculoskeletal: Negative.   Skin:  Positive for rash.  Neurological: Negative.   Psychiatric/Behavioral: Negative.     Objective:  Physical Exam Constitutional:      Appearance: She is well-developed.  HENT:      Head: Normocephalic and atraumatic.  Cardiovascular:     Rate and Rhythm: Normal rate and regular rhythm.  Pulmonary:     Effort: Pulmonary effort is normal. No respiratory distress.     Breath sounds: Normal breath sounds. No wheezing or rales.  Abdominal:     General: Bowel sounds are normal. There is no distension.     Palpations: Abdomen is soft.     Tenderness: There is no abdominal tenderness. There is no rebound.  Musculoskeletal:     Cervical back: Normal range of motion.  Skin:    General: Skin is warm and dry.     Findings: Rash present.  Neurological:     Mental Status: She is alert and oriented to person, place, and time.     Coordination: Coordination normal.    Vitals:   11/04/20 0908  BP: 130/88  Pulse: 74  Temp: 98.7 F (37.1 C)  TempSrc: Oral  SpO2: 94%  Weight: 208 lb 12.8 oz (94.7 kg)  Height: 5' 10.5" (1.791 m)    This visit occurred during the SARS-CoV-2 public health emergency.  Safety protocols were in place, including screening questions prior to the visit, additional usage of staff PPE, and extensive cleaning of exam room while observing appropriate contact time as indicated for disinfecting solutions.   Assessment & Plan:

## 2020-11-04 NOTE — Assessment & Plan Note (Signed)
Flu shot yearly. Covid-19 4 shots. Pneumonia complete. Shingrix counseled to get at pharmacy. Tetanus due 2027. Colonoscopy aged out. Mammogram yearly, pap smear aged out and dexa declines further. Counseled about sun safety and mole surveillance. Counseled about the dangers of distracted driving. Given 10 year screening recommendations.

## 2020-11-04 NOTE — Assessment & Plan Note (Signed)
Taking xarelto. Not on beta blocker and no flare recently.

## 2020-11-04 NOTE — Assessment & Plan Note (Signed)
Advised she can increase zyrtec for the itching to help reduce this. She is using topical and working with dermatology to try to figure this out.

## 2020-11-05 DIAGNOSIS — Z79899 Other long term (current) drug therapy: Secondary | ICD-10-CM | POA: Diagnosis not present

## 2020-11-11 ENCOUNTER — Telehealth: Payer: Self-pay | Admitting: Internal Medicine

## 2020-11-11 NOTE — Telephone Encounter (Signed)
Called patient. LVM asking her to give our office a call to discuss her swelling. Office number was provided

## 2020-11-11 NOTE — Telephone Encounter (Signed)
   Seeking advice  Patient seeking advice for swollen feet  Declined appointment at this time

## 2020-11-26 NOTE — Progress Notes (Signed)
Patient ID: Laura Mendez, female    DOB: 1941/05/18, 80 y.o.   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 Fib Labs 11/23/16- allergy profile total IgE 409 on Xolair, elevated specific IgE for dust mite and cockroach. EOS 3,300, Nl ANA and alpha-gal Derm Path report reviewed- Spongiotic/ eczema process- does not describe it as urticaria. Xolair was changed to Laura Mendez June, 2018, to see if Xolair was causing her rash-no improvement after 5 months Office Spirometry 12/20/2017-very severe obstructive airways disease.  FVC 2.09/60%, FEV1 0.90/34%, ratio 0.43 Quantiferon tb Gold 06/22/17- Neg ---------------------------------------------------------------.   05/30/20- 80 year old female former smoker followed for severe asthma/COPD(formerly used Xolair/Nucala), complicated by allergic rhinitis, paroxysmal A. Fib/ Xarelto, CVA, ,  rash, breast cancer R,  Xarelto,  neb Duoneb, Wixela 250, ventolin hfa Covid vax-3 Phizer Flu vax- had Laura Mendez for more short of breath this week. Dry cough/ throat clearing. Dermatology still working on her rash> she is waiting to start [name] for this.  CXR 11/28/19- IMPRESSION: Bibasilar scarring. No acute abnormalities.  11/27/20- 80 year old female former smoker followed for severe asthma/COPD(formerly used Xolair/Nucala), complicated by allergic rhinitis, paroxysmal A. Fib/ Xarelto, CVA, rash, breast cancer R,  -Xarelto,  neb Duoneb, Wixela 250, ventolin hfa Covid vax-3 Phizer ------Sob, cough-clear Rescue inhaler 2x/ day, Wixela Chronic pruritic rash unchanged after seeing several Dermatologists. Staying home to avoid infection. Laura Mendez does her errands. Morning throat clearing.  Review of Systems-see HPI   + = positive Constitutional:   No-   weight loss, night sweats, fevers, chills, fatigue, lassitude. HEENT:   No-  headaches, difficulty swallowing, tooth/dental  problems, sore throat,       No-  sneezing, +itching, ear ache,  nasal congestion, +post nasal drip,  CV:  No-   chest pain, orthopnea, PND, swelling in lower extremities, anasarca, dizziness, palpitations Resp:  + shortness of breath with exertion or at rest- unchanged             No-   productive cough,  + non-productive cough,  No-  coughing up of blood.              No-   change in color of mucus.  + wheezing.   Skin:  +rash or lesions. GI:  No-   heartburn, indigestion, abdominal pain, nausea, vomiting, GU:  MS:  +   joint pain or swelling.   Neuro- nothing unusual  Psych:  No- change in mood or affect. No depression or anxiety.  No memory loss  Objective:     General- Alert, Oriented, Affect-appropriate, Distress- none acute, Pleasant, + Overweight Skin- +diffuse patchy ecxematoid rash with excoriation. Lymphadenopathy- none Head- atraumatic            Eyes- Gross vision intact, PERRLA, conjunctivae clear secretions            Ears- Hearing, canals normal            Nose- Mild turbinate edema, No-Septal dev, mucus, polyps, erosion, perforation             Throat- Mallampati II , mucosa -not red, drainage- none, tonsils- atrophic Neck- flexible , trachea midline, no stridor , thyroid nl, carotid no bruit Chest - symmetrical excursion , unlabored           Heart/CV- RRR +, no murmur , no gallop  , no rub, nl s1 s2                           -  JVD- none , edema- none, stasis changes- none, varices- none           Lung- +clear to P&A/ distant/unlabored, wheeze- none,  Cough-none, dullness-none, rub- none           Chest wall-  Abd-  Br/ Gen/ Rectal- Not done, not indicated Extrem- cyanosis- none, clubbing, none, atrophy- none, strength- nl Neuro- grossly intact to observation

## 2020-11-27 ENCOUNTER — Encounter: Payer: Self-pay | Admitting: Internal Medicine

## 2020-11-27 ENCOUNTER — Ambulatory Visit: Payer: Medicare Other | Admitting: Internal Medicine

## 2020-11-27 ENCOUNTER — Other Ambulatory Visit: Payer: Self-pay

## 2020-11-27 ENCOUNTER — Ambulatory Visit (INDEPENDENT_AMBULATORY_CARE_PROVIDER_SITE_OTHER): Payer: Medicare Other

## 2020-11-27 VITALS — BP 112/72 | HR 62 | Temp 98.2°F | Ht 70.0 in | Wt 210.6 lb

## 2020-11-27 DIAGNOSIS — R21 Rash and other nonspecific skin eruption: Secondary | ICD-10-CM

## 2020-11-27 DIAGNOSIS — J449 Chronic obstructive pulmonary disease, unspecified: Secondary | ICD-10-CM

## 2020-11-27 IMAGING — DX DG CHEST 2V
2 series · 2 of 2 positions shown · non-contrast
Comparison: [DATE].

CLINICAL DATA: COPD.

EXAM:
CHEST - 2 VIEW

[chest pa]
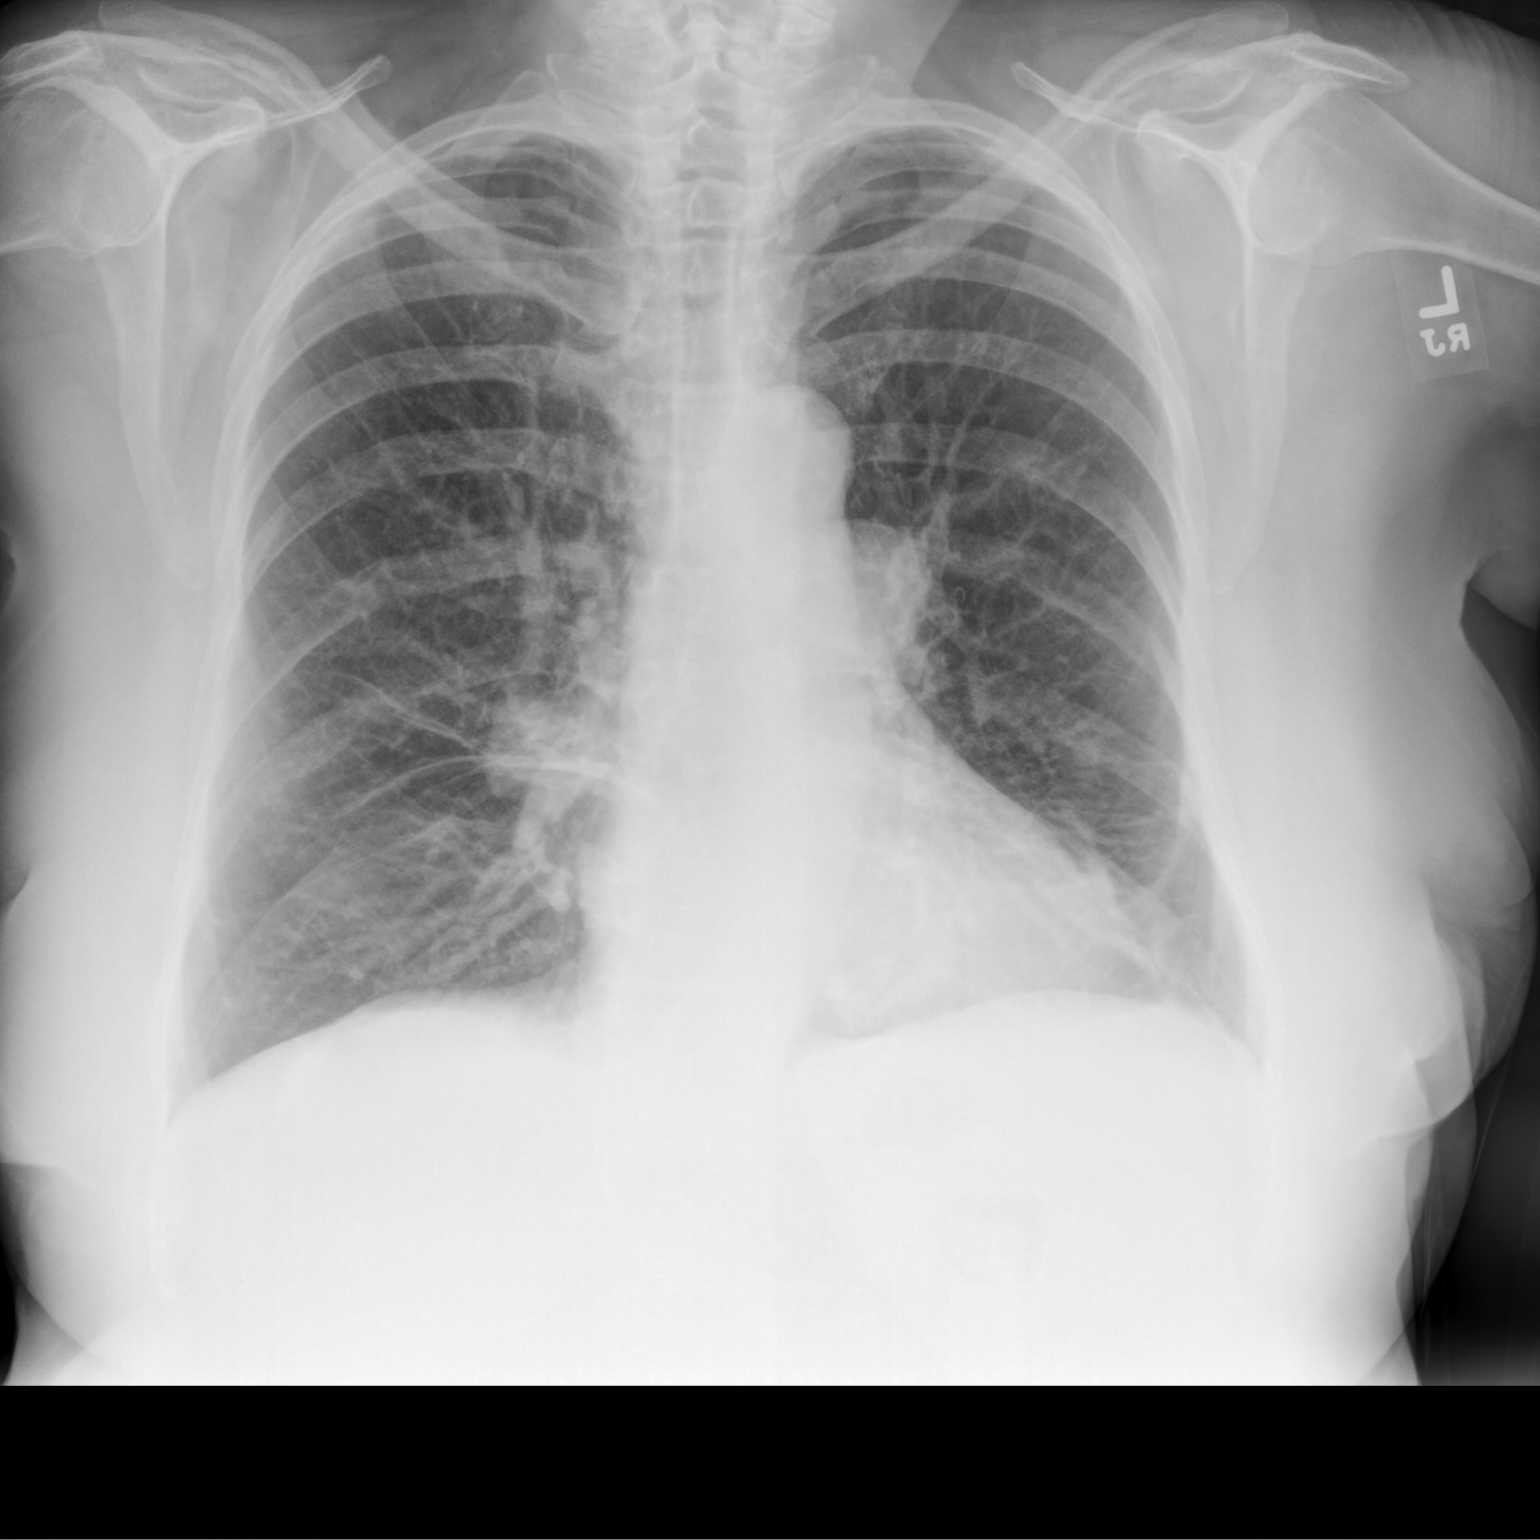

[chest lat]
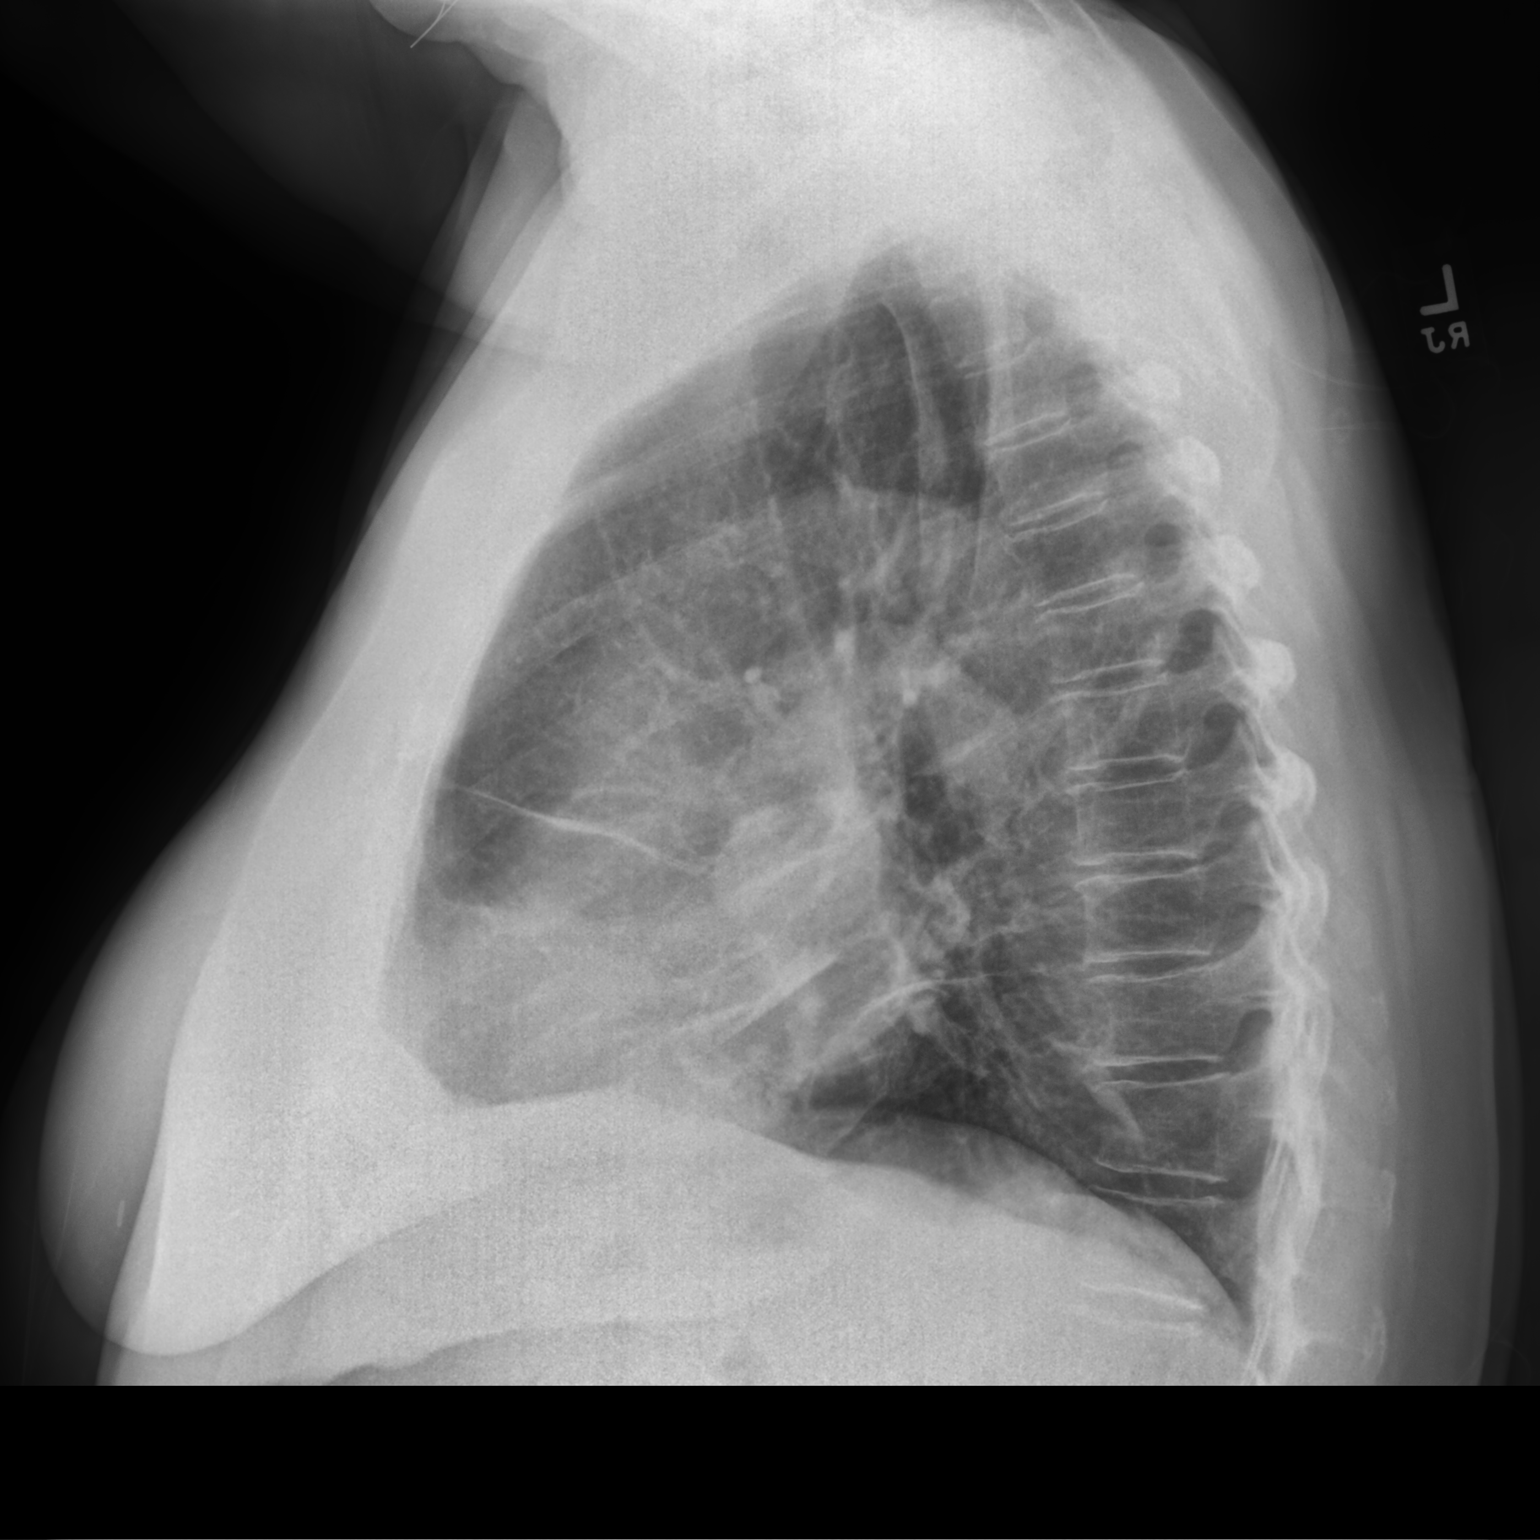

[2 of 2 positions shown; findings below may reference images not displayed]

FINDINGS: The heart size and mediastinal contours are within normal limits.
Stable bibasilar scarring is noted. No definite acute abnormality is
noted. The visualized skeletal structures are unremarkable.
IMPRESSION: Stable bibasilar scarring.  No definite acute abnormality is noted.

## 2020-11-27 NOTE — Patient Instructions (Signed)
Order-  CXR    dx  COPD mixed type  We can continue current meds  I suggest you go ahead and get your second Covid booster now. Then in the Fall get   Your flu shot and when available, get the updated Covid booster.

## 2020-11-30 ENCOUNTER — Other Ambulatory Visit: Payer: Self-pay

## 2020-11-30 ENCOUNTER — Ambulatory Visit (HOSPITAL_COMMUNITY)
Admission: EM | Admit: 2020-11-30 | Discharge: 2020-11-30 | Disposition: A | Discharge status: Home / Self Care | Payer: Medicare Other | Attending: Emergency Medicine | Admitting: Emergency Medicine

## 2020-11-30 DIAGNOSIS — R21 Rash and other nonspecific skin eruption: Secondary | ICD-10-CM

## 2020-11-30 MED ORDER — PREDNISONE 20 MG PO TABS: 20.0000 mg | ORAL_TABLET | Freq: Every day | ORAL | 0 refills | Status: DC

## 2020-11-30 NOTE — Discharge Instructions (Addendum)
Take the prednisone daily for the next 5 days.  Continue to use the hydroxyzine and cream that dermatology prescribed.  Avoid using hot water when showering or bathing as this can make the itching worse.  You can apply ice intermittently for comfort.  Use a good emollient lotion such as CeraVe, Cetaphil, or Aquaphor.  It is best to apply lotion to wet skin after bathing.    Follow up with dermatology as soon as possible for re-evaluation.

## 2020-11-30 NOTE — ED Provider Notes (Signed)
[facility]    CSN: [id number] Arrival date & time: 11/30/20  1020      History   Chief Complaint Chief Complaint  Patient presents with   Rash    HPI Laura Mendez is a 80 y.o. female.   Patient here for evaluation of rash to bilateral upper extremities and back that has been ongoing for the past several weeks.  Reports being evaluated by dermatology and was given hydroxyzine as well as a lotion which has only provided minimal relief.  Denies any known contacts with allergens.  Denies any changes to lotion, soaps, or detergents.  Denies any trauma, injury, or other precipitating event.  Denies any specific alleviating or aggravating factors.  Denies any fevers, chest pain, shortness of breath, N/V/D, numbness, tingling, weakness, abdominal pain, or headaches.     The history is provided by the patient.  Rash  Past Medical History:  Diagnosis Date   Asthma    Chronic airway obstruction, not elsewhere classified    Diastolic dysfunction without heart failure    Disorder of bone and cartilage, unspecified    Dysrhythmia    A-Fib   Family history of colon cancer    Family history of kidney cancer    Family history of ovarian cancer    Paroxysmal atrial flutter ([place])    Stroke ([place])    TIA   TIA (transient ischemic attack)    Unspecified asthma(493.90)     Patient Active Problem List   Diagnosis Date Noted   Arthralgia 07/28/2020   Abnormal laboratory test 04/10/2020   Genetic testing 01/05/2018   Malignant neoplasm of lower-outer quadrant of right breast of female, estrogen receptor positive ([place]) 56/38/9373   Diastolic dysfunction 42/87/6811   Rash and nonspecific skin eruption 04/01/2016   Paroxysmal atrial flutter ([place]) 01/13/2016   Routine general medical examination at a health care facility 01/13/2016   Retinal vein occlusion 06/16/2015   Seasonal and perennial allergic rhinitis 08/06/2011   Asthma with COPD ([place]) 08/04/2007   Osteopenia  03/31/2007    Past Surgical History:  Procedure Laterality Date   ABDOMINAL HYSTERECTOMY     BREAST LUMPECTOMY Right 12/30/2017   BREAST LUMPECTOMY WITH RADIOACTIVE SEED LOCALIZATION Right 12/30/2017   Procedure: BREAST LUMPECTOMY WITH RADIOACTIVE SEED LOCALIZATION X'S 3;  Surgeon: Rolm Bookbinder, MD;  Location: [facility];  Service: General;  Laterality: Right;   BUNIONECTOMY Bilateral    EYE SURGERY Bilateral    cataract removal    OB History   No obstetric history on file.      Home Medications    Prior to Admission medications   Medication Sig Start Date End Date Taking? Authorizing Provider  albuterol (VENTOLIN HFA) 108 (90 Base) MCG/ACT inhaler INHALE 2 PUFFS BY MOUTH EVERY 4 HOURS AS NEEDED 07/02/20  Yes Young, Tarri Fuller D, MD  cholecalciferol (VITAMIN D) 1000 units tablet Take 1,000 Units by mouth daily.   Yes [provider]  clobetasol cream (TEMOVATE) 5.72 % Apply 1 application topically as needed.   Yes [provider]  fish oil-omega-3 fatty acids 1000 MG capsule Take 1 g by mouth daily.   Yes [provider]  Fluticasone-Salmeterol (WIXELA INHUB) 250-50 MCG/DOSE AEPB Inhale 1 puff into the lungs 2 (two) times daily. 07/24/20  Yes Young, Clinton D, MD  ipratropium-albuterol (DUONEB) 0.5-2.5 (3) MG/3ML SOLN Take 3 mLs by nebulization every 6 (six) hours as needed. 05/30/20  Yes Young, Kasandra Knudsen, MD  MEGARED OMEGA-3 KRILL OIL 500 MG CAPS  Take 500 mg by mouth daily.    Yes [provider]  Multiple Vitamin (MULTIVITAMIN) tablet Take 1 tablet by mouth daily.   Yes [provider]  predniSONE (DELTASONE) 20 MG tablet Take 1 tablet (20 mg total) by mouth daily for 5 days. 11/30/20 12/05/20 Yes Pearson Forster, NP  XARELTO 20 MG TABS tablet TAKE 1 TABLET BY MOUTH ONCE DAILY WITH  SUPPER 06/04/20  Yes Jerline Pain, MD  letrozole [facility]) 2.5 MG tablet Take 1 tablet by mouth once daily 08/21/20   Nicholas Lose, MD  triamcinolone ointment  (KENALOG) 0.1 % Apply 1 application topically 2 (two) times daily as needed (for itching).    [provider]    Family History Family History  Problem Relation Age of Onset   COPD Father    Colon cancer Mother 60       mets to pancreas and liver   Kidney cancer Sister 51       d. 33   Lung cancer Sister        d. 31, smoker   Ovarian cancer Sister 32       d. 74   Cirrhosis Sister        d. 41   Melanoma Sister 59   Ovarian cancer Maternal Grandmother        d. 71   Heart disease Maternal Grandfather        rheumatic heart disease   Rectal cancer Paternal Grandfather        d. 10   Parkinson's disease Brother    Ovarian cancer Maternal Aunt    Colon cancer Maternal Uncle        d. 72-73   Heart attack Paternal Uncle        d. 52   Kidney cancer Brother 65   Colon cancer Maternal Uncle        d. 40   Cancer Other        MGMs pat 1/2 sister with uterine and rectal cancer   Colon cancer Cousin        d. 54 - mat first cousin   Breast cancer Neg Hx     Social History Social History   Tobacco Use   Smoking status: Former    Years: 0.00    Pack years: 0.00    Types: Cigarettes    Quit date: 05/24/1977    Years since quitting: 43.5   Smokeless tobacco: Never   Tobacco comments:    7/7 pt states she occasionally smoked, let cigs "burn" more than she smoked them  Vaping Use   Vaping Use: Never used  Substance Use Topics   Alcohol use: No   Drug use: No     Allergies   Clarithromycin, Ibuprofen, Codeine, Codeine phosphate, Diltiazem, Tape, Tapentadol, and Verapamil hcl er   Review of Systems Review of Systems  Skin:  Positive for rash.  All other systems reviewed and are negative.   Physical Exam Triage Vital Signs ED Triage Vitals [11/30/20 1046]  Enc Vitals Group     BP (!) 144/76     Pulse Rate 73     Resp 18     Temp 98.7 F (37.1 C)     Temp src      SpO2 97 %     Weight      Height      Head Circumference      Peak Flow       Pain Score  0     Pain Loc      Pain Edu?      Excl. in [place]?    No data found.  Updated Vital Signs BP (!) 144/76   Pulse 73   Temp 98.7 F (37.1 C)   Resp 18   SpO2 97%   Visual Acuity Right Eye Distance:   Left Eye Distance:   Bilateral Distance:    Right Eye Near:   Left Eye Near:    Bilateral Near:     Physical Exam Vitals and nursing note reviewed.  Constitutional:      General: She is not in acute distress.    Appearance: Normal appearance. She is not ill-appearing, toxic-appearing or diaphoretic.  HENT:     Head: Normocephalic and atraumatic.  Eyes:     Conjunctiva/sclera: Conjunctivae normal.  Cardiovascular:     Rate and Rhythm: Normal rate.     Pulses: Normal pulses.  Pulmonary:     Effort: Pulmonary effort is normal.  Abdominal:     General: Abdomen is flat.  Musculoskeletal:        General: Normal range of motion.     Cervical back: Normal range of motion.  Skin:    General: Skin is warm and dry.     Findings: Rash present. Rash is papular (diffuse placement across bilateral upper extremities, lower extremities, and back).  Neurological:     General: No focal deficit present.     Mental Status: She is alert and oriented to person, place, and time.  Psychiatric:        Mood and Affect: Mood normal.     UC Treatments / Results  Labs (all labs ordered are listed, but only abnormal results are displayed) Labs Reviewed - No data to display  EKG   Radiology No results found.  Procedures Procedures (including critical care time)  Medications Ordered in UC Medications - No data to display  Initial Impression / Assessment and Plan / UC Course  I have reviewed the triage vital signs and the nursing notes.  Pertinent labs & imaging results that were available during my care of the patient were reviewed by me and considered in my medical decision making (see chart for details).    Assessment for red flags or concerns.  Likely contact  dermatitis.  We will treat with prednisone daily for the next 5 days.  Continue to use hydroxyzine and cream that had been previously prescribed by dermatology.  Recommend avoiding hot water when showering or bathing. May apply ice for comfort.  Recommend an emollient lotion, such as CeraVe, Cetaphil, or Aquaphor.  Follow up with dermatology as soon as possible.   Final Clinical Impressions(s) / UC Diagnoses   Final diagnoses:  Rash     Discharge Instructions      Take the prednisone daily for the next 5 days.  Continue to use the hydroxyzine and cream that dermatology prescribed.  Avoid using hot water when showering or bathing as this can make the itching worse.  You can apply ice intermittently for comfort.  Use a good emollient lotion such as CeraVe, Cetaphil, or Aquaphor.  It is best to apply lotion to wet skin after bathing.    Follow up with dermatology as soon as possible for re-evaluation.       ED Prescriptions     Medication Sig Dispense Auth. Provider   predniSONE (DELTASONE) 20 MG tablet Take 1 tablet (20 mg total) by mouth daily for 5 days.  5 tablet Pearson Forster, NP      PDMP not reviewed this encounter.   Pearson Forster, NP 11/30/20 1118

## 2020-11-30 NOTE — ED Triage Notes (Signed)
Pt has had a rash for one week to Bil shoulders RT leg. Pt has seen a Dermatologist who wanted to give her a sleeping pill.

## 2020-12-01 ENCOUNTER — Ambulatory Visit
Admission: RE | Admit: 2020-12-01 | Discharge: 2020-12-01 | Disposition: A | Discharge status: Home / Self Care | Payer: Medicare Other | Source: Ambulatory Visit | Attending: Hematology and Oncology | Admitting: Hematology and Oncology

## 2020-12-01 ENCOUNTER — Other Ambulatory Visit: Payer: Self-pay | Admitting: Hematology and Oncology

## 2020-12-01 DIAGNOSIS — Z1231 Encounter for screening mammogram for malignant neoplasm of breast: Secondary | ICD-10-CM | POA: Diagnosis not present

## 2020-12-01 DIAGNOSIS — Z9889 Other specified postprocedural states: Secondary | ICD-10-CM

## 2020-12-01 IMAGING — MG MM DIGITAL SCREENING BILAT W/ TOMO AND CAD
8 series · 8 of 24 positions shown · non-contrast
Comparison: Previous exam(s).

CLINICAL DATA: Screening.

EXAM:
DIGITAL SCREENING BILATERAL MAMMOGRAM WITH TOMOSYNTHESIS AND CAD
TECHNIQUE: Bilateral screening digital craniocaudal and mediolateral oblique
mammograms were obtained. Bilateral screening digital breast
tomosynthesis was performed. The images were evaluated with
computer-aided detection.

[L MLO synth-2D]
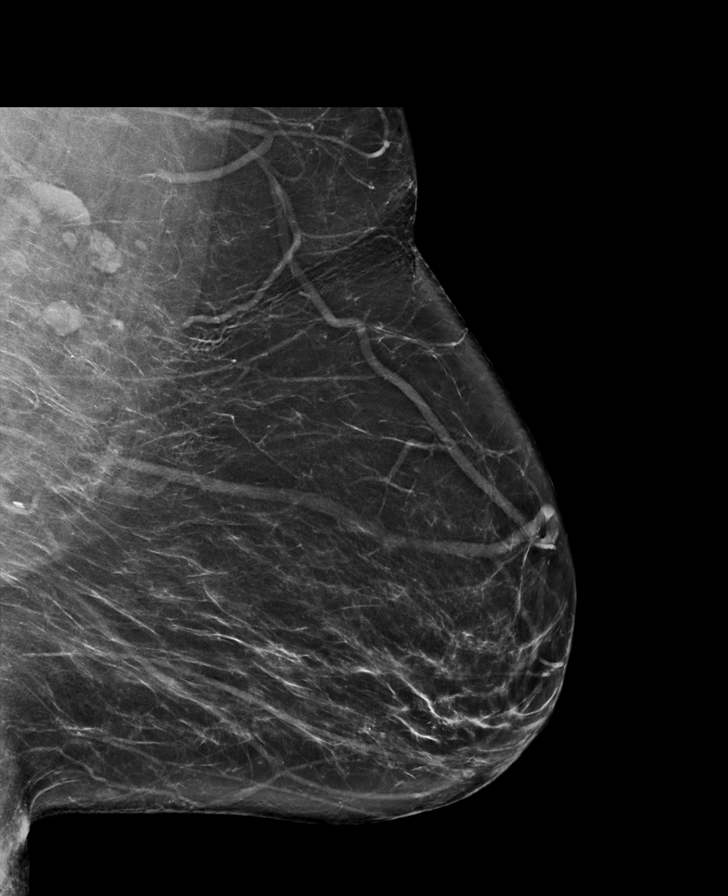

[R CC synth-2D]
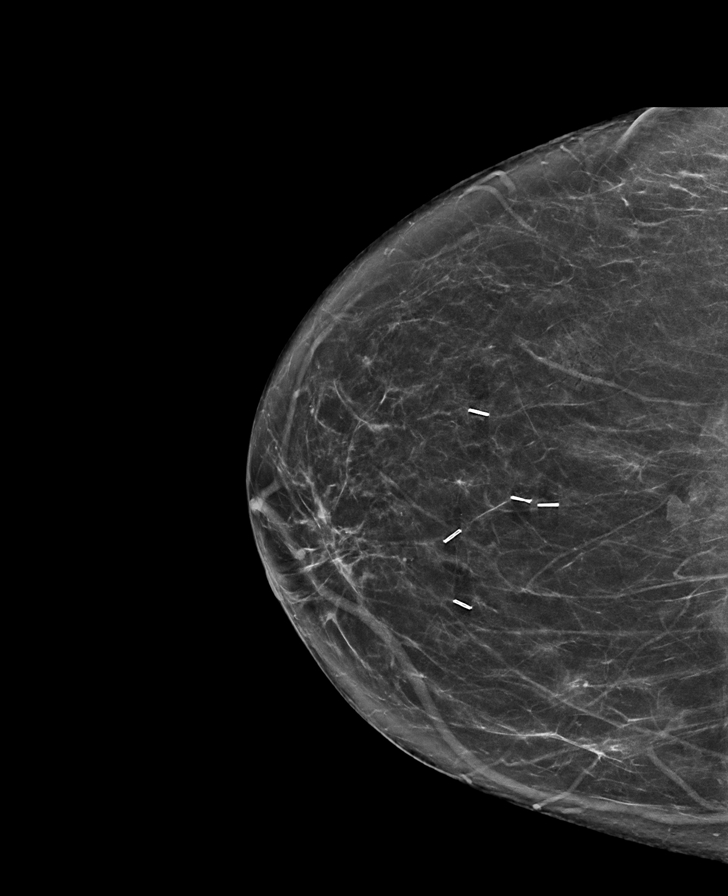

[R MLO synth-2D]
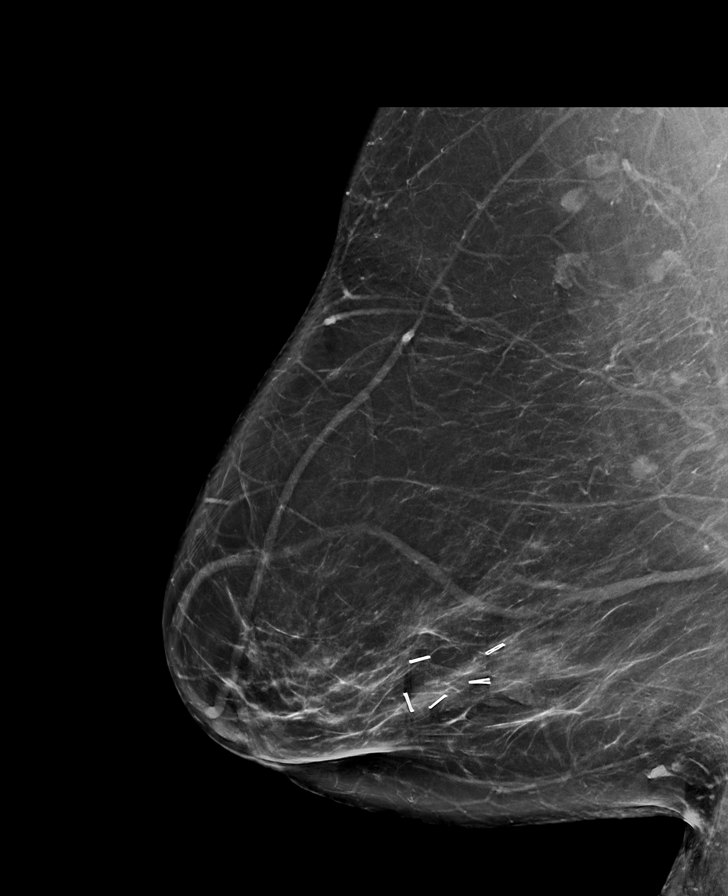

[L CC synth-2D]
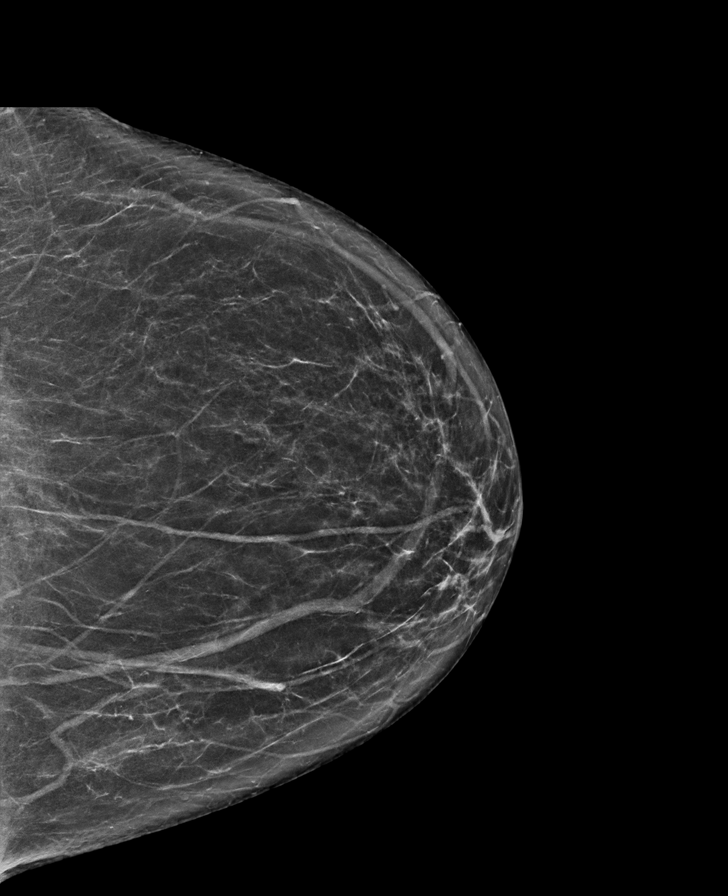

[L CC tomo · tomo slice 33/66.0]
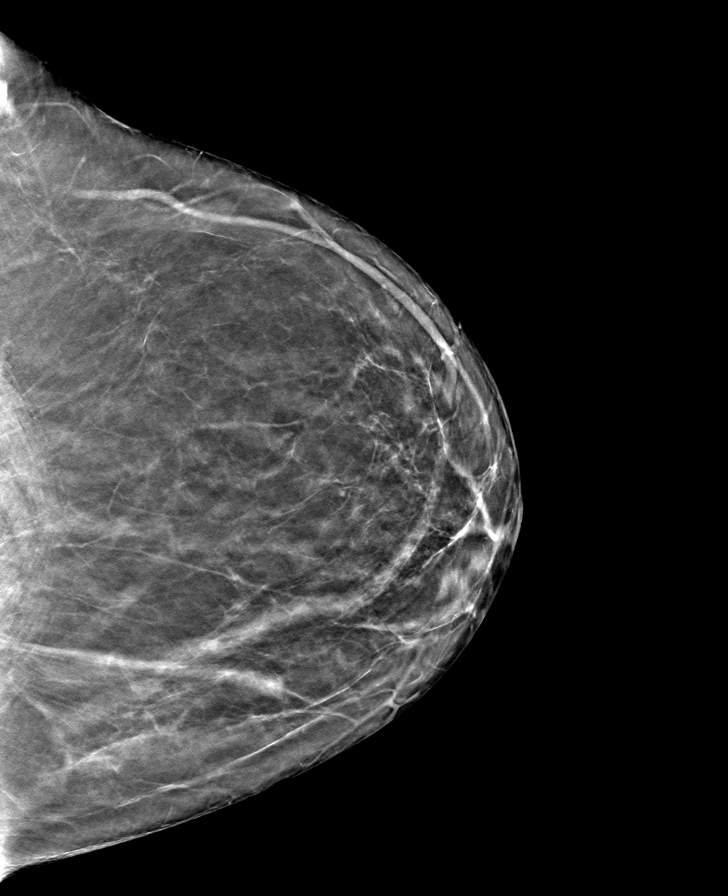

[R MLO tomo · tomo slice 43/84.0]
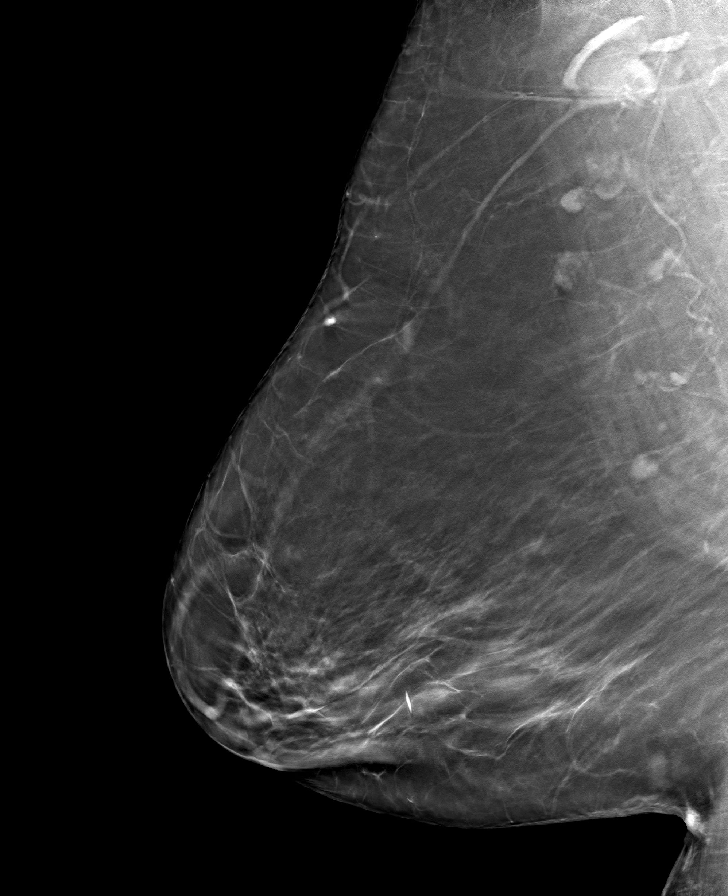

[R CC tomo · tomo slice 35/69.0]
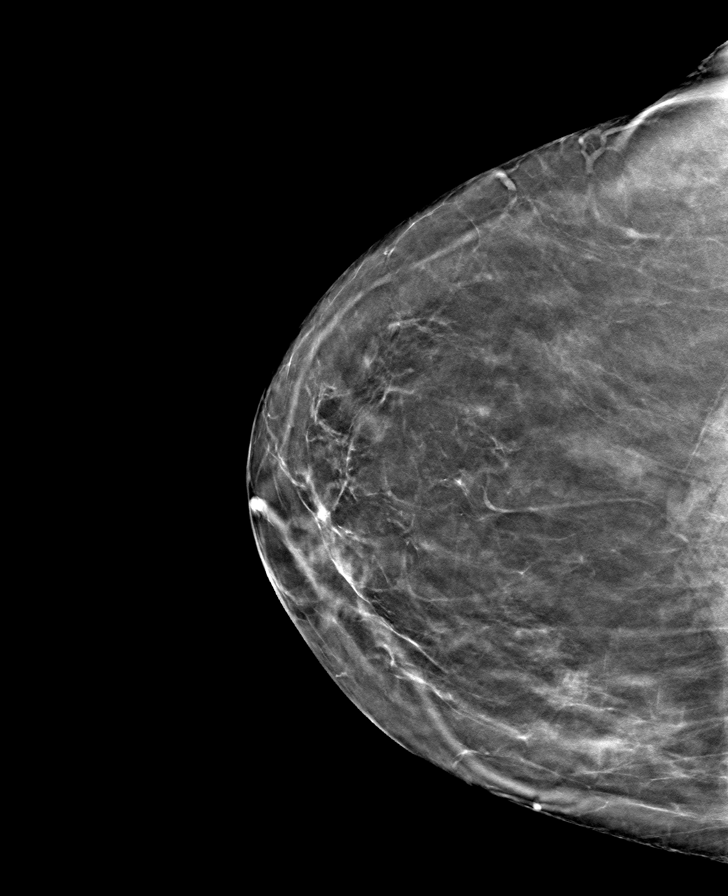

[L MLO tomo · tomo slice 41/80.0]
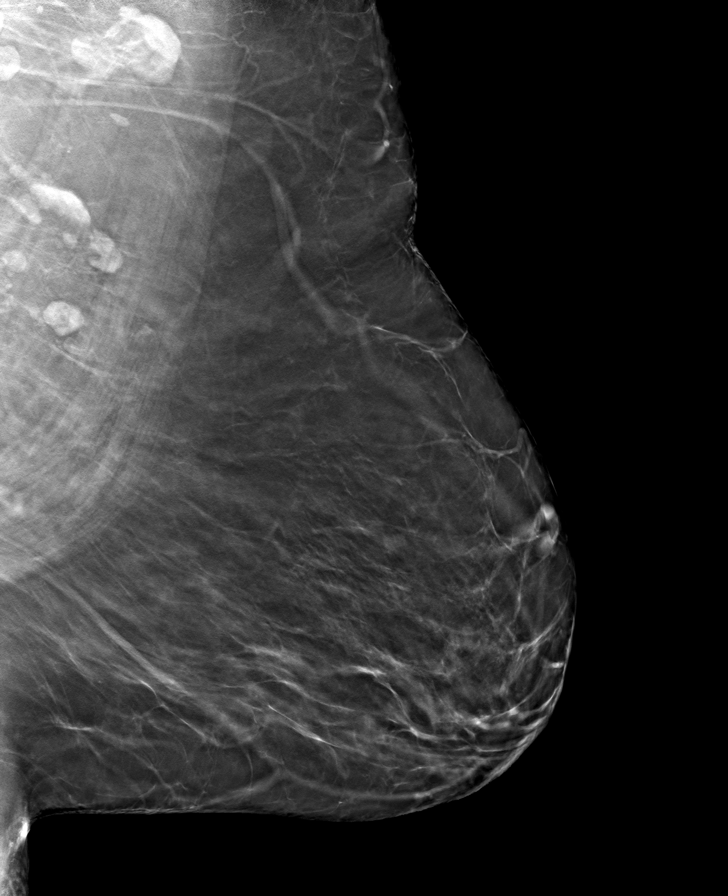

[8 of 24 positions shown; findings below may reference images not displayed]

ACR Breast Density Category b: There are scattered areas of
fibroglandular density.
FINDINGS: There are no findings suspicious for malignancy.
IMPRESSION: No mammographic evidence of malignancy. A result letter of this
screening mammogram will be mailed directly to the patient.

RECOMMENDATION:
Screening mammogram in one year. (Code:[BY])

BI-RADS CATEGORY  1: Negative.

## 2020-12-02 DIAGNOSIS — H5213 Myopia, bilateral: Secondary | ICD-10-CM | POA: Diagnosis not present

## 2020-12-02 DIAGNOSIS — Z961 Presence of intraocular lens: Secondary | ICD-10-CM | POA: Diagnosis not present

## 2020-12-02 DIAGNOSIS — H349 Unspecified retinal vascular occlusion: Secondary | ICD-10-CM | POA: Diagnosis not present

## 2020-12-02 DIAGNOSIS — H26491 Other secondary cataract, right eye: Secondary | ICD-10-CM | POA: Diagnosis not present

## 2020-12-08 DIAGNOSIS — L400 Psoriasis vulgaris: Secondary | ICD-10-CM | POA: Diagnosis not present

## 2020-12-19 ENCOUNTER — Other Ambulatory Visit: Payer: Self-pay | Admitting: Internal Medicine

## 2020-12-22 ENCOUNTER — Ambulatory Visit (INDEPENDENT_AMBULATORY_CARE_PROVIDER_SITE_OTHER): Payer: Medicare Other | Admitting: Internal Medicine

## 2020-12-22 ENCOUNTER — Other Ambulatory Visit: Payer: Self-pay

## 2020-12-22 ENCOUNTER — Encounter: Payer: Self-pay | Admitting: Internal Medicine

## 2020-12-22 VITALS — BP 126/72 | HR 76 | Temp 98.4°F | Resp 18 | Ht 70.0 in | Wt 213.4 lb

## 2020-12-22 DIAGNOSIS — R21 Rash and other nonspecific skin eruption: Secondary | ICD-10-CM

## 2020-12-22 LAB — TSH: TSH: 2.54 u[IU]/mL (ref 0.35–5.50)

## 2020-12-22 LAB — VITAMIN D 25 HYDROXY (VIT D DEFICIENCY, FRACTURES): VITD: 48.97 ng/mL (ref 30.00–100.00)

## 2020-12-22 LAB — VITAMIN B12: Vitamin B-12: 640 pg/mL (ref 211–911)

## 2020-12-22 LAB — HEMOGLOBIN A1C: Hgb A1c MFr Bld: 5.6 % (ref 4.6–6.5)

## 2020-12-22 NOTE — Patient Instructions (Signed)
We will check the labs today to see if there is some auto-immune cause of another cause for the rash.

## 2020-12-22 NOTE — Progress Notes (Signed)
   Subjective:   Patient ID: Laura Mendez, female    DOB: August 14, 1940, 80 y.o.   MRN: [id number]  HPI The patient is an 80 YO female coming in for rash. Seen dermatology and they have tried several treatments. They do not know the cause and staff there told her they could not help her and she should not return. She is seeing another dermatologist through [facility] in the near future. Seen at urgent care recently and they did give her prednisone which is the only thing which has helped this but only she stops this does not help. She is taking zyrtec BID (we had advised up to QID to help with itching).   Review of Systems  Constitutional: Negative.   HENT: Negative.    Eyes: Negative.   Respiratory:  Negative for cough, chest tightness and shortness of breath.   Cardiovascular:  Negative for chest pain, palpitations and leg swelling.  Gastrointestinal:  Negative for abdominal distention, abdominal pain, constipation, diarrhea, nausea and vomiting.  Musculoskeletal: Negative.   Skin:  Positive for rash.  Neurological: Negative.   Psychiatric/Behavioral: Negative.     Objective:  Physical Exam Constitutional:      Appearance: She is well-developed.  HENT:     Head: Normocephalic and atraumatic.  Cardiovascular:     Rate and Rhythm: Normal rate and regular rhythm.  Pulmonary:     Effort: Pulmonary effort is normal. No respiratory distress.     Breath sounds: Normal breath sounds. No wheezing or rales.  Abdominal:     General: Bowel sounds are normal. There is no distension.     Palpations: Abdomen is soft.     Tenderness: There is no abdominal tenderness. There is no rebound.  Musculoskeletal:     Cervical back: Normal range of motion.  Skin:    General: Skin is warm and dry.     Findings: Rash present.     Comments: Rash head to toe, on the palms of the hands, macular without bullous lesions or blistering  Neurological:     Mental Status: She is alert and oriented to  person, place, and time.     Coordination: Coordination normal.    Vitals:   12/22/20 1500  BP: 126/72  Pulse: 76  Resp: 18  Temp: 98.4 F (36.9 C)  TempSrc: Oral  SpO2: 96%  Weight: 213 lb 6.4 oz (96.8 kg)  Height: '5\' 10"'$  (1.778 m)    This visit occurred during the SARS-CoV-2 public health emergency.  Safety protocols were in place, including screening questions prior to the visit, additional usage of staff PPE, and extensive cleaning of exam room while observing appropriate contact time as indicated for disinfecting solutions.   Assessment & Plan:

## 2020-12-23 ENCOUNTER — Other Ambulatory Visit: Payer: Self-pay | Admitting: Cardiology

## 2020-12-23 ENCOUNTER — Other Ambulatory Visit: Payer: Self-pay | Admitting: Internal Medicine

## 2020-12-23 NOTE — Telephone Encounter (Signed)
Prescription refill request for Xarelto received.  Indication: afib  Last office visit: [facility] 04/30/2020 Weight: 96.8 kg  Age: 80 yo  Scr: 0.92, 04/09/2020 CrCl: 74 ml/min   Refill sent

## 2020-12-23 NOTE — Assessment & Plan Note (Signed)
Checking TSH, Hga1c, RPR, ANA panel, Vitamin B12 and D to assess. She has had recent CBC and CMP. She is using ointment and this is not helping much. We talked about possibility of immune reaction causing this versus medication reaction. There are some post-marketing reports with femara causing rash although this is not new medication.

## 2020-12-24 LAB — ANA, IFA COMPREHENSIVE PANEL
Anti Nuclear Antibody (ANA): NEGATIVE
ENA SM Ab Ser-aCnc: <1 AI
SM/RNP: <1 AI
SSA (Ro) (ENA) Antibody, IgG: <1 AI
SSB (La) (ENA) Antibody, IgG: <1 AI
Scleroderma (Scl-70) (ENA) Antibody, IgG: <1 AI
ds DNA Ab: 18 IU/mL — ABNORMAL HIGH

## 2020-12-24 LAB — RPR: RPR Ser Ql: NONREACTIVE

## 2020-12-25 ENCOUNTER — Telehealth: Payer: Self-pay | Admitting: Internal Medicine

## 2020-12-25 NOTE — Telephone Encounter (Signed)
   Patient requesting order for Ketoconazole  to use on rash.  Pharmacy [place], [place]

## 2020-12-25 NOTE — Telephone Encounter (Signed)
See below

## 2020-12-26 NOTE — Telephone Encounter (Signed)
See my chart message

## 2020-12-26 NOTE — Telephone Encounter (Signed)
I do not think this is a fungal rash so I do not think ketoconazole cream will help the rash.

## 2021-01-05 DIAGNOSIS — L400 Psoriasis vulgaris: Secondary | ICD-10-CM | POA: Diagnosis not present

## 2021-02-13 DIAGNOSIS — J3089 Other allergic rhinitis: Secondary | ICD-10-CM | POA: Diagnosis not present

## 2021-02-13 DIAGNOSIS — L299 Pruritus, unspecified: Secondary | ICD-10-CM | POA: Diagnosis not present

## 2021-02-13 DIAGNOSIS — J4540 Moderate persistent asthma, uncomplicated: Secondary | ICD-10-CM | POA: Diagnosis not present

## 2021-02-13 DIAGNOSIS — L409 Psoriasis, unspecified: Secondary | ICD-10-CM | POA: Diagnosis not present

## 2021-03-02 DIAGNOSIS — Z23 Encounter for immunization: Secondary | ICD-10-CM | POA: Diagnosis not present

## 2021-03-02 DIAGNOSIS — L820 Inflamed seborrheic keratosis: Secondary | ICD-10-CM | POA: Diagnosis not present

## 2021-03-02 DIAGNOSIS — L209 Atopic dermatitis, unspecified: Secondary | ICD-10-CM | POA: Diagnosis not present

## 2021-03-09 ENCOUNTER — Other Ambulatory Visit: Payer: Self-pay

## 2021-03-09 ENCOUNTER — Telehealth: Payer: Self-pay | Admitting: Internal Medicine

## 2021-03-09 ENCOUNTER — Ambulatory Visit (INDEPENDENT_AMBULATORY_CARE_PROVIDER_SITE_OTHER): Payer: Medicare Other | Admitting: Internal Medicine

## 2021-03-09 DIAGNOSIS — Z23 Encounter for immunization: Secondary | ICD-10-CM

## 2021-03-09 NOTE — Telephone Encounter (Signed)
Patient states feet and right ankle is "puffy"  Patient requesting a call back  Please advise

## 2021-03-10 NOTE — Telephone Encounter (Signed)
Called patient. LVM asking her to give our office a call back to discuss her ankle. Office number was provided.

## 2021-03-13 DIAGNOSIS — L2084 Intrinsic (allergic) eczema: Secondary | ICD-10-CM | POA: Diagnosis not present

## 2021-03-16 ENCOUNTER — Ambulatory Visit: Payer: Medicare Other | Admitting: Internal Medicine

## 2021-03-16 ENCOUNTER — Telehealth: Payer: Self-pay | Admitting: Internal Medicine

## 2021-03-16 ENCOUNTER — Other Ambulatory Visit: Payer: Self-pay

## 2021-03-16 ENCOUNTER — Encounter: Payer: Self-pay | Admitting: Internal Medicine

## 2021-03-16 VITALS — BP 150/80 | HR 79 | Temp 98.1°F | Ht 70.5 in | Wt 210.8 lb

## 2021-03-16 DIAGNOSIS — T63481A Toxic effect of venom of other arthropod, accidental (unintentional), initial encounter: Secondary | ICD-10-CM

## 2021-03-16 DIAGNOSIS — J449 Chronic obstructive pulmonary disease, unspecified: Secondary | ICD-10-CM

## 2021-03-16 MED ORDER — METHYLPREDNISOLONE ACETATE 80 MG/ML IJ SUSP
80.0000 mg | Freq: Once | INTRAMUSCULAR | Status: AC
  Administered 2021-03-16: 80 mg via INTRAMUSCULAR

## 2021-03-16 NOTE — Progress Notes (Signed)
Patient ID: TEQUILLA COUSINEAU, female    DOB: [birth year], 80 y.o.   MRN: [id number]  HPI F former smoker followed for Severe asthma/ COPD FEV1 37%,  complicated by allergic rhinitis,  paroxysmal A. Fib Labs 11/23/16- allergy profile total IgE 409 on Xolair, elevated specific IgE for dust mite and cockroach. EOS 3,300, Nl ANA and alpha-gal Derm Path report reviewed- Spongiotic/ eczema process- does not describe it as urticaria. Xolair was changed to [facility] June, 2018, to see if Xolair was causing her rash-no improvement after 5 months Office Spirometry 12/20/2017-very severe obstructive airways disease.  FVC 2.09/60%, FEV1 0.90/34%, ratio 0.43 Quantiferon tb Gold 06/22/17- Neg ---------------------------------------------------------------. 11/27/20- 80 year old female former smoker followed for severe asthma/COPD(formerly used Xolair/Nucala), complicated by allergic rhinitis, paroxysmal A. Fib/ Xarelto, CVA, rash, breast cancer R,  -Xarelto,  neb Duoneb, Wixela 250, ventolin hfa Covid vax-3 Phizer ------Sob, cough-clear Rescue inhaler 2x/ day, Wixela  03/16/21-  80 year old female former smoker followed for severe asthma/COPD(formerly used Xolair/Nucala), complicated by allergic rhinitis, paroxysmal A. Fib/ Xarelto, CVA, rash/ Atopic Dermatitis, breast cancer R,  -Xarelto,  neb Duoneb, Wixela 250, ventolin hfa Covid vax-3 Phizer Flu vax- had -----Patient was stung by a bee yesterday and is requesting a depo shot. [place] sting yesterday evening.  No systemic reaction.  Today feels she may have more than usual chest congestion. Has been going to [facility] dermatology for her rash and is on daily Zyrtec. CXR 11/27/20 IMPRESSION: Stable bibasilar scarring.  No definite acute abnormality is noted.   Review of Systems-see HPI   + = positive Constitutional:   No-   weight loss, night sweats, fevers, chills, fatigue, lassitude. HEENT:   No-  headaches, difficulty swallowing, tooth/dental  problems, sore throat,       No-  sneezing, +itching, ear ache,  nasal congestion, +post nasal drip,  CV:  No-   chest pain, orthopnea, PND, swelling in lower extremities, anasarca, dizziness, palpitations Resp:  + shortness of breath with exertion or at rest- unchanged             No-   productive cough,  + non-productive cough,  No-  coughing up of blood.              No-   change in color of mucus.  + wheezing.   Skin:  +rash or lesions. GI:  No-   heartburn, indigestion, abdominal pain, nausea, vomiting, GU:  MS:  +   joint pain or swelling.   Neuro- nothing unusual  Psych:  No- change in mood or affect. No depression or anxiety.  No memory loss  Objective:    General- Alert, Oriented, Affect-appropriate, Distress- none acute, Pleasant, + Overweight Skin- + numerous unremarkable keratoses on her back with areas of excoriation especially on back and thighs Lymphadenopathy- none Head- atraumatic            Eyes- Gross vision intact, PERRLA, conjunctivae clear secretions            Ears- Hearing, canals normal            Nose- Mild turbinate edema, No-Septal dev, mucus, polyps, erosion, perforation             Throat- Mallampati II , mucosa -not red, drainage- none, tonsils- atrophic Neck- flexible , trachea midline, no stridor , thyroid nl, carotid no bruit Chest - symmetrical excursion , unlabored           Heart/CV- RRR +, no murmur , no  gallop  , no rub, nl s1 s2                           - JVD- none , edema- none, stasis changes- none, varices- none           Lung- +clear to P&A/ distant/unlabored, wheeze- none,  cough+ minimal, dullness-none, rub- none           Chest wall-  Abd-  Br/ Gen/ Rectal- Not done, not indicated Extrem- cyanosis- none, clubbing, none, atrophy- none, strength- nl Neuro- grossly intact to observation

## 2021-03-16 NOTE — Patient Instructions (Addendum)
Order- depo 80    dx hymenoptera sting  Keep benadryl at home for first aid and consider going to Urgent Care for immediate attention if concerned with future stings   Keep January 9 appointment- call earlier as needed

## 2021-03-16 NOTE — Telephone Encounter (Signed)
Patient called stating she was stung yesterday around 7pm by a yellow jacket. Patient is requesting a depo shot today, because she is having some chest congestion and hoarseness.  Patient stated she feels the shot would be better then taking anything by mouth and does not want any prednisone.   Message routed to Dr. Annamaria Boots to advise  Allergies  Allergen Reactions   Clarithromycin Anaphylaxis    "just about killed me"   Ibuprofen Hives   Codeine Nausea Only   Codeine Phosphate Nausea Only   Diltiazem Rash   Tape Itching and Other (See Comments)    Tears skin   Tapentadol Itching    Other reaction(s): Other (See Comments) Tears skin   Verapamil Hcl Er Rash   Current Outpatient Medications on File Prior to Visit  Medication Sig Dispense Refill   albuterol (VENTOLIN HFA) 108 (90 Base) MCG/ACT inhaler INHALE 2 PUFFS INTO LUNGS EVERY 4 HOURS AS NEEDED 18 g 5   cholecalciferol (VITAMIN D) 1000 units tablet Take 1,000 Units by mouth daily.     clobetasol cream (TEMOVATE) 5.63 % Apply 1 application topically as needed.     fish oil-omega-3 fatty acids 1000 MG capsule Take 1 g by mouth daily.     Fluticasone-Salmeterol (WIXELA INHUB) 250-50 MCG/DOSE AEPB Inhale 1 puff into the lungs 2 (two) times daily. 60 each 10   ipratropium-albuterol (DUONEB) 0.5-2.5 (3) MG/3ML SOLN Take 3 mLs by nebulization every 6 (six) hours as needed. 75 mL 12   letrozole (FEMARA) 2.5 MG tablet Take 1 tablet by mouth once daily 90 tablet 3   MEGARED OMEGA-3 KRILL OIL 500 MG CAPS Take 500 mg by mouth daily.      Multiple Vitamin (MULTIVITAMIN) tablet Take 1 tablet by mouth daily.     rivaroxaban (XARELTO) 20 MG TABS tablet TAKE 1 TABLET BY MOUTH ONCE DAILY WITH SUPPER 90 tablet 1   triamcinolone ointment (KENALOG) 0.1 % Apply 1 application topically 2 (two) times daily as needed (for itching).     Current Facility-Administered Medications on File Prior to Visit  Medication Dose Route Frequency Provider Last Rate Last  Admin   Mepolizumab SOLR 100 mg  100 mg Subcutaneous Q28 days Baird Lyons D, MD   100 mg at 12/20/17 1455

## 2021-03-16 NOTE — Telephone Encounter (Signed)
Ok to add her on with me or an app this afternoon for depo shot

## 2021-03-16 NOTE — Telephone Encounter (Signed)
Patient scheduled 4:30 with Dr. Annamaria Boots for depo shot. Nothing further at this time.

## 2021-03-17 MED ORDER — PREDNISONE 10 MG PO TABS: ORAL_TABLET | ORAL | 0 refills | Status: DC

## 2021-03-17 NOTE — Telephone Encounter (Signed)
Prednisone 10 mg, # 20, 4 X 2 DAYS, 3 X 2 DAYS, 2 X 2 DAYS, 1 X 2 DAYS  

## 2021-03-17 NOTE — Telephone Encounter (Signed)
Called and spoke with patient. She was seen yesterday by Dr. Annamaria Boots for a bee sting. She was given a depo injection in office due to the chest congestion she was having. She woke up this morning and the congestion seems to be worse. She is still SOB. She also has a rash that is all over but she stated that this is not new.   She wanted to see if Dr. Annamaria Boots could send in a prednisone taper for her. She forgot to ask yesterday during her OV.   Pharmacy is [facility] [address].   Dr. Annamaria Boots, can you please advise? Thanks!

## 2021-03-17 NOTE — Telephone Encounter (Signed)
Called and spoke with patient to let her know that Dr. Annamaria Boots will send in RX for Prednisone for her. She verified preferred pharmacy. Nothing further needed at this time.

## 2021-04-03 ENCOUNTER — Telehealth: Payer: Self-pay | Admitting: Internal Medicine

## 2021-04-03 NOTE — Telephone Encounter (Signed)
Patient called stating she has been feeling bad for the last 3 days, coughing, congestion, mucous, been using her portable nebulizer  Patient states she doesn't have a home covid test, and knows she doesn't have covid  Patient states she will reach out to her Pulmonary MD since her Provider is not in office

## 2021-04-06 ENCOUNTER — Encounter: Payer: Self-pay | Admitting: Internal Medicine

## 2021-04-06 NOTE — Assessment & Plan Note (Signed)
Well controlled with no recent exacerbation Plan- continue current meds. Discussed vaccines.

## 2021-04-06 NOTE — Assessment & Plan Note (Signed)
Eczematoid rash has never cleared.  Plan- follow Dermatology advice- moisturizing therapies.

## 2021-04-13 ENCOUNTER — Other Ambulatory Visit: Payer: Self-pay

## 2021-04-13 ENCOUNTER — Encounter (HOSPITAL_COMMUNITY): Payer: Self-pay | Admitting: Emergency Medicine

## 2021-04-13 ENCOUNTER — Ambulatory Visit (HOSPITAL_COMMUNITY)
Admission: EM | Admit: 2021-04-13 | Discharge: 2021-04-13 | Disposition: A | Discharge status: Home / Self Care | Payer: Medicare Other | Attending: Emergency Medicine | Admitting: Emergency Medicine

## 2021-04-13 DIAGNOSIS — R21 Rash and other nonspecific skin eruption: Secondary | ICD-10-CM | POA: Diagnosis not present

## 2021-04-13 MED ORDER — PREDNISONE 10 MG (21) PO TBPK: ORAL_TABLET | Freq: Every day | ORAL | 0 refills | Status: DC

## 2021-04-13 MED ORDER — METHYLPREDNISOLONE SODIUM SUCC 125 MG IJ SOLR
INTRAMUSCULAR | Status: AC
  Filled 2021-04-13: qty 2

## 2021-04-13 MED ORDER — METHYLPREDNISOLONE SODIUM SUCC 125 MG IJ SOLR
60.0000 mg | Freq: Once | INTRAMUSCULAR | Status: AC
  Administered 2021-04-13: 60 mg via INTRAMUSCULAR

## 2021-04-13 NOTE — ED Triage Notes (Signed)
Pt states she has had a rash for over a year, has been seen several times by a dermatologist with another appt in December, but is here to get a steroid shot to help with sx relief. States the rash is all over.

## 2021-04-13 NOTE — Discharge Instructions (Signed)
Beginning tomorrow take prednisone every morning with food as directed on package  May follow-up with urgent care as needed

## 2021-04-13 NOTE — ED Provider Notes (Signed)
Laura Mendez    CSN: [id number] Arrival date & time: 04/13/21  1049      History   Chief Complaint Chief Complaint  Patient presents with   Rash    HPI Laura Mendez is a 80 y.o. female.   Patient presents with chronic erythematous and pruritic rash to bilateral arms and legs.  Followed by dermatology currently taking CellCept and topical clobetasol.  Has upcoming dermatology appointment for follow-up in December.  Requesting steroid course to help with symptoms over holiday weekend.   Past Medical History:  Diagnosis Date   Asthma    Chronic airway obstruction, not elsewhere classified    Diastolic dysfunction without heart failure    Disorder of bone and cartilage, unspecified    Dysrhythmia    A-Fib   Family history of colon cancer    Family history of kidney cancer    Family history of ovarian cancer    Paroxysmal atrial flutter ([place])    Stroke ([place])    TIA   TIA (transient ischemic attack)    Unspecified asthma(493.90)     Patient Active Problem List   Diagnosis Date Noted   Arthralgia 07/28/2020   Abnormal laboratory test 04/10/2020   Genetic testing 01/05/2018   Malignant neoplasm of lower-outer quadrant of right breast of female, estrogen receptor positive ([place]) 35/57/3220   Diastolic dysfunction 25/42/7062   Rash and nonspecific skin eruption 04/01/2016   Paroxysmal atrial flutter ([place]) 01/13/2016   Routine general medical examination at a health care facility 01/13/2016   Retinal vein occlusion 06/16/2015   Seasonal and perennial allergic rhinitis 08/06/2011   Asthma with COPD ([place]) 08/04/2007   Osteopenia 03/31/2007    Past Surgical History:  Procedure Laterality Date   ABDOMINAL HYSTERECTOMY     BREAST LUMPECTOMY Right 12/30/2017   BREAST LUMPECTOMY WITH RADIOACTIVE SEED LOCALIZATION Right 12/30/2017   Procedure: BREAST LUMPECTOMY WITH RADIOACTIVE SEED LOCALIZATION X'S 3;  Surgeon: Rolm Bookbinder, MD;  Location: [facility];   Service: General;  Laterality: Right;   BUNIONECTOMY Bilateral    EYE SURGERY Bilateral    cataract removal    OB History   No obstetric history on file.      Home Medications    Prior to Admission medications   Medication Sig Start Date End Date Taking? Authorizing Provider  predniSONE (STERAPRED UNI-PAK 21 TAB) 10 MG (21) TBPK tablet Take by mouth daily. Take 6 tabs by mouth daily  for 2 days, then 5 tabs for 2 days, then 4 tabs for 2 days, then 3 tabs for 2 days, 2 tabs for 2 days, then 1 tab by mouth daily for 2 days 04/13/21  Yes Cecilie Heidel R, NP  albuterol (VENTOLIN HFA) 108 (90 Base) MCG/ACT inhaler INHALE 2 PUFFS INTO LUNGS EVERY 4 HOURS AS NEEDED 12/24/20   Baird Lyons D, MD  cholecalciferol (VITAMIN D) 1000 units tablet Take 1,000 Units by mouth daily.    [provider]  clobetasol cream (TEMOVATE) 3.76 % Apply 1 application topically as needed.    [provider]  fish oil-omega-3 fatty acids 1000 MG capsule Take 1 g by mouth daily.    [provider]  Fluticasone-Salmeterol (WIXELA INHUB) 250-50 MCG/DOSE AEPB Inhale 1 puff into the lungs 2 (two) times daily. 07/24/20   Baird Lyons D, MD  ipratropium-albuterol (DUONEB) 0.5-2.5 (3) MG/3ML SOLN Take 3 mLs by nebulization every 6 (six) hours as needed. 05/30/20   Deneise Lever, MD  letrozole [facility]) 2.5 MG  tablet Take 1 tablet by mouth once daily 08/21/20   Nicholas Lose, MD  MEGARED OMEGA-3 KRILL OIL 500 MG CAPS Take 500 mg by mouth daily.     [provider]  Multiple Vitamin (MULTIVITAMIN) tablet Take 1 tablet by mouth daily.    [provider]  rivaroxaban (XARELTO) 20 MG TABS tablet TAKE 1 TABLET BY MOUTH ONCE DAILY WITH SUPPER 12/23/20   Jerline Pain, MD  triamcinolone ointment (KENALOG) 0.1 % Apply 1 application topically 2 (two) times daily as needed (for itching).    [provider]    Family History Family History  Problem Relation Age of Onset   COPD  Father    Colon cancer Mother 90       mets to pancreas and liver   Kidney cancer Sister 44       d. 21   Lung cancer Sister        d. 30, smoker   Ovarian cancer Sister 39       d. 33   Cirrhosis Sister        d. 53   Melanoma Sister 38   Ovarian cancer Maternal Grandmother        d. 79   Heart disease Maternal Grandfather        rheumatic heart disease   Rectal cancer Paternal Grandfather        d. 36   Parkinson's disease Brother    Ovarian cancer Maternal Aunt    Colon cancer Maternal Uncle        d. 72-73   Heart attack Paternal Uncle        d. 64   Kidney cancer Brother 27   Colon cancer Maternal Uncle        d. 53   Cancer Other        MGMs pat 1/2 sister with uterine and rectal cancer   Colon cancer Cousin        d. 17 - mat first cousin   Breast cancer Neg Hx     Social History Social History   Tobacco Use   Smoking status: Former    Years: 0.00    Types: Cigarettes    Quit date: 05/24/1977    Years since quitting: 43.9   Smokeless tobacco: Never   Tobacco comments:    7/7 pt states she occasionally smoked, let cigs "burn" more than she smoked them  Vaping Use   Vaping Use: Never used  Substance Use Topics   Alcohol use: No   Drug use: No     Allergies   Clarithromycin, Ibuprofen, Codeine, Codeine phosphate, Diltiazem, Tape, Tapentadol, and Verapamil hcl er   Review of Systems Review of Systems  Constitutional: Negative.   Respiratory: Negative.    Cardiovascular: Negative.   Skin:  Positive for rash. Negative for color change, pallor and wound.    Physical Exam Triage Vital Signs ED Triage Vitals  Enc Vitals Group     BP 04/13/21 1212 (!) 143/74     Pulse Rate 04/13/21 1212 76     Resp 04/13/21 1212 18     Temp 04/13/21 1212 97.6 F (36.4 C)     Temp Source 04/13/21 1212 Oral     SpO2 04/13/21 1212 94 %     Weight --      Height --      Head Circumference --      Peak Flow --      Pain Score 04/13/21 1213  0     Pain Loc --       Pain Edu? --      Excl. in [place]? --    No data found.  Updated Vital Signs BP (!) 143/74   Pulse 76   Temp 97.6 F (36.4 C) (Oral)   Resp 18   SpO2 94%   Visual Acuity Right Eye Distance:   Left Eye Distance:   Bilateral Distance:    Right Eye Near:   Left Eye Near:    Bilateral Near:     Physical Exam Constitutional:      Appearance: Normal appearance. She is normal weight.  HENT:     Head: Normocephalic.  Eyes:     Extraocular Movements: Extraocular movements intact.  Pulmonary:     Effort: Pulmonary effort is normal.  Skin:    Comments: Macular erythematous rash present over bilateral forearms and bilateral lower leg, excoriations present  Neurological:     Mental Status: She is alert and oriented to person, place, and time. Mental status is at baseline.  Psychiatric:        Mood and Affect: Mood normal.        Behavior: Behavior normal.     UC Treatments / Results  Labs (all labs ordered are listed, but only abnormal results are displayed) Labs Reviewed - No data to display  EKG   Radiology No results found.  Procedures Procedures (including critical care time)  Medications Ordered in UC Medications  methylPREDNISolone sodium succinate (SOLU-MEDROL) 125 mg/2 mL injection 60 mg (60 mg Intramuscular Given 04/13/21 1245)    Initial Impression / Assessment and Plan / UC Course  I have reviewed the triage vital signs and the nursing notes.  Pertinent labs & imaging results that were available during my care of the patient were reviewed by me and considered in my medical decision making (see chart for details).  Rash  1.  Methylprednisolone 60 mg IM now 2.  Prednisone 60 mg taper to begin tomorrow 3.  Urgent care follow-up as needed 4.  Recommended continue use of dermatology treatment and follow-up with in December Final Clinical Impressions(s) / UC Diagnoses   Final diagnoses:  Rash     Discharge Instructions      Beginning tomorrow take  prednisone every morning with food as directed on package  May follow-up with urgent care as needed   ED Prescriptions     Medication Sig Dispense Auth. Provider   predniSONE (STERAPRED UNI-PAK 21 TAB) 10 MG (21) TBPK tablet Take by mouth daily. Take 6 tabs by mouth daily  for 2 days, then 5 tabs for 2 days, then 4 tabs for 2 days, then 3 tabs for 2 days, 2 tabs for 2 days, then 1 tab by mouth daily for 2 days 42 tablet Keosha Rossa, Leitha Schuller, NP      PDMP not reviewed this encounter.   Hans Eden, NP 04/13/21 1401

## 2021-04-16 ENCOUNTER — Encounter (HOSPITAL_BASED_OUTPATIENT_CLINIC_OR_DEPARTMENT_OTHER): Payer: Self-pay | Admitting: Obstetrics and Gynecology

## 2021-04-16 ENCOUNTER — Other Ambulatory Visit: Payer: Self-pay

## 2021-04-16 ENCOUNTER — Emergency Department (HOSPITAL_BASED_OUTPATIENT_CLINIC_OR_DEPARTMENT_OTHER)
Admission: EM | Admit: 2021-04-16 | Discharge: 2021-04-16 | Disposition: A | Discharge status: Home / Self Care | Payer: Medicare Other | Attending: Emergency Medicine | Admitting: Emergency Medicine

## 2021-04-16 DIAGNOSIS — J45909 Unspecified asthma, uncomplicated: Secondary | ICD-10-CM | POA: Insufficient documentation

## 2021-04-16 DIAGNOSIS — S6992XA Unspecified injury of left wrist, hand and finger(s), initial encounter: Secondary | ICD-10-CM | POA: Diagnosis present

## 2021-04-16 DIAGNOSIS — Z7901 Long term (current) use of anticoagulants: Secondary | ICD-10-CM | POA: Insufficient documentation

## 2021-04-16 DIAGNOSIS — Z23 Encounter for immunization: Secondary | ICD-10-CM | POA: Insufficient documentation

## 2021-04-16 DIAGNOSIS — W548XXA Other contact with dog, initial encounter: Secondary | ICD-10-CM | POA: Diagnosis not present

## 2021-04-16 DIAGNOSIS — S61512A Laceration without foreign body of left wrist, initial encounter: Secondary | ICD-10-CM | POA: Diagnosis not present

## 2021-04-16 DIAGNOSIS — Z87891 Personal history of nicotine dependence: Secondary | ICD-10-CM | POA: Insufficient documentation

## 2021-04-16 MED ORDER — TETANUS-DIPHTH-ACELL PERTUSSIS 5-2.5-18.5 LF-MCG/0.5 IM SUSY
0.5000 mL | PREFILLED_SYRINGE | Freq: Once | INTRAMUSCULAR | Status: AC
  Administered 2021-04-16: 0.5 mL via INTRAMUSCULAR
  Filled 2021-04-16: qty 0.5

## 2021-04-16 MED ORDER — AEROCHAMBER PLUS FLO-VU MISC
1.0000 | Freq: Once | Status: DC
  Filled 2021-04-16: qty 1

## 2021-04-16 NOTE — ED Provider Notes (Signed)
[facility] Provider Note  CSN: [id number] Arrival date & time: 04/16/21 2153    History Chief Complaint  Patient presents with   skin tear    Laura Mendez is a 80 y.o. female reports her son's dogs jumped on her this evening and she sustained skin tears to L dorsal wrist, no other concerns. Unsure of tdap.    Past Medical History:  Diagnosis Date   Asthma    Chronic airway obstruction, not elsewhere classified    Diastolic dysfunction without heart failure    Disorder of bone and cartilage, unspecified    Dysrhythmia    A-Fib   Family history of colon cancer    Family history of kidney cancer    Family history of ovarian cancer    Paroxysmal atrial flutter ([facility])    Stroke ([place])    TIA   TIA (transient ischemic attack)    Unspecified asthma(493.90)     Past Surgical History:  Procedure Laterality Date   ABDOMINAL HYSTERECTOMY     BREAST LUMPECTOMY Right 12/30/2017   BREAST LUMPECTOMY WITH RADIOACTIVE SEED LOCALIZATION Right 12/30/2017   Procedure: BREAST LUMPECTOMY WITH RADIOACTIVE SEED LOCALIZATION X'S 3;  Surgeon: Rolm Bookbinder, MD;  Location: [facility];  Service: General;  Laterality: Right;   BUNIONECTOMY Bilateral    EYE SURGERY Bilateral    cataract removal    Family History  Problem Relation Age of Onset   COPD Father    Colon cancer Mother 31       mets to pancreas and liver   Kidney cancer Sister 25       d. 35   Lung cancer Sister        d. 91, smoker   Ovarian cancer Sister 50       d. 16   Cirrhosis Sister        d. 72   Melanoma Sister 27   Ovarian cancer Maternal Grandmother        d. 45   Heart disease Maternal Grandfather        rheumatic heart disease   Rectal cancer Paternal Grandfather        d. 33   Parkinson's disease Brother    Ovarian cancer Maternal Aunt    Colon cancer Maternal Uncle        d. 72-73   Heart attack Paternal Uncle        d. 32   Kidney cancer Brother 47   Colon  cancer Maternal Uncle        d. 32   Cancer Other        MGMs pat 1/2 sister with uterine and rectal cancer   Colon cancer Cousin        d. 51 - mat first cousin   Breast cancer Neg Hx     Social History   Tobacco Use   Smoking status: Former    Years: 0.00    Types: Cigarettes    Quit date: 05/24/1977    Years since quitting: 43.9   Smokeless tobacco: Never   Tobacco comments:    7/7 pt states she occasionally smoked, let cigs "burn" more than she smoked them  Vaping Use   Vaping Use: Never used  Substance Use Topics   Alcohol use: No   Drug use: No     Home Medications Prior to Admission medications   Medication Sig Start Date End Date Taking? Authorizing Provider  albuterol (VENTOLIN HFA) 108 (90 Base) MCG/ACT  inhaler INHALE 2 PUFFS INTO LUNGS EVERY 4 HOURS AS NEEDED 12/24/20   Baird Lyons D, MD  cholecalciferol (VITAMIN D) 1000 units tablet Take 1,000 Units by mouth daily.    [provider]  clobetasol cream (TEMOVATE) 6.28 % Apply 1 application topically as needed.    [provider]  fish oil-omega-3 fatty acids 1000 MG capsule Take 1 g by mouth daily.    [provider]  Fluticasone-Salmeterol (WIXELA INHUB) 250-50 MCG/DOSE AEPB Inhale 1 puff into the lungs 2 (two) times daily. 07/24/20   Baird Lyons D, MD  ipratropium-albuterol (DUONEB) 0.5-2.5 (3) MG/3ML SOLN Take 3 mLs by nebulization every 6 (six) hours as needed. 05/30/20   Baird Lyons D, MD  letrozole [facility]) 2.5 MG tablet Take 1 tablet by mouth once daily 08/21/20   Nicholas Lose, MD  MEGARED OMEGA-3 KRILL OIL 500 MG CAPS Take 500 mg by mouth daily.     [provider]  Multiple Vitamin (MULTIVITAMIN) tablet Take 1 tablet by mouth daily.    [provider]  predniSONE (STERAPRED UNI-PAK 21 TAB) 10 MG (21) TBPK tablet Take by mouth daily. Take 6 tabs by mouth daily  for 2 days, then 5 tabs for 2 days, then 4 tabs for 2 days, then 3 tabs for 2 days, 2 tabs for 2 days,  then 1 tab by mouth daily for 2 days 04/13/21   Hans Eden, NP  rivaroxaban (XARELTO) 20 MG TABS tablet TAKE 1 TABLET BY MOUTH ONCE DAILY WITH SUPPER 12/23/20   Jerline Pain, MD  triamcinolone ointment (KENALOG) 0.1 % Apply 1 application topically 2 (two) times daily as needed (for itching).    [provider]     Allergies    Clarithromycin, Ibuprofen, Codeine, Codeine phosphate, Diltiazem, Tape, Tapentadol, and Verapamil hcl er   Review of Systems   Review of Systems A comprehensive review of systems was completed and negative except as noted in HPI.    Physical Exam BP (!) 180/88 (BP Location: Right Arm)   Pulse 100   Temp 97.9 F (36.6 C) (Oral)   Resp 19   Ht 5\' 10"  (1.778 m)   Wt 90.7 kg   SpO2 97%   BMI 28.70 kg/m   Physical Exam Vitals and nursing note reviewed.  HENT:     Head: Normocephalic.     Nose: Nose normal.  Eyes:     Extraocular Movements: Extraocular movements intact.  Pulmonary:     Effort: Pulmonary effort is normal.  Musculoskeletal:        General: Normal range of motion.     Cervical back: Neck supple.  Skin:    Findings: No rash (on exposed skin).     Comments: Two superficial well approximated skin tears on L dorsal wrist. No deformity or signs of bony injury. No signs of infection.   Neurological:     Mental Status: She is alert and oriented to person, place, and time.  Psychiatric:        Mood and Affect: Mood normal.     ED Results / Procedures / Treatments   Labs (all labs ordered are listed, but only abnormal results are displayed) Labs Reviewed - No data to display  EKG None   Radiology No results found.  Procedures Procedures  Medications Ordered in the ED Medications  aerochamber plus with mask device 1 each (has no administration in time range)  Tdap (BOOSTRIX) injection 0.5 mL (has no administration in time range)  MDM Rules/Calculators/A&P MDM Nonadherent dressing applied here. TDAP  updated, Wound care instructions given. PCP follow up.   ED Course  I have reviewed the triage vital signs and the nursing notes.  Pertinent labs & imaging results that were available during my care of the patient were reviewed by me and considered in my medical decision making (see chart for details).     Final Clinical Impression(s) / ED Diagnoses Final diagnoses:  Tear of skin of left wrist, initial encounter    Rx / DC Orders ED Discharge Orders     None        Truddie Hidden, MD 04/16/21 2217

## 2021-04-16 NOTE — ED Notes (Signed)
RT educated pt on proper use of MDI w/spacer. Pt able to perform w/out difficulty.

## 2021-04-16 NOTE — ED Triage Notes (Signed)
Patient reports to the ER for a skin rear on the right arm/wrist area. States her son brought the dogs for Thanksgiving and they were a bit overzealous and pulled her skin

## 2021-05-21 DIAGNOSIS — L299 Pruritus, unspecified: Secondary | ICD-10-CM | POA: Diagnosis not present

## 2021-05-21 DIAGNOSIS — L2084 Intrinsic (allergic) eczema: Secondary | ICD-10-CM | POA: Diagnosis not present

## 2021-05-21 DIAGNOSIS — Z5181 Encounter for therapeutic drug level monitoring: Secondary | ICD-10-CM | POA: Diagnosis not present

## 2021-05-29 DIAGNOSIS — T63481A Toxic effect of venom of other arthropod, accidental (unintentional), initial encounter: Secondary | ICD-10-CM | POA: Insufficient documentation

## 2021-05-29 NOTE — Assessment & Plan Note (Signed)
Now almost 24 hours after a local reaction.  EpiPen was not indicated.  She is still concerned. Plan-Depo-Medrol.  Education.

## 2021-05-29 NOTE — Progress Notes (Signed)
Patient ID: Laura Mendez, female    DOB: [birth year], 81 y.o.   MRN: [id number]  HPI F former smoker followed for Severe asthma/ COPD FEV1 31%,  complicated by allergic rhinitis,  paroxysmal A. Fib Labs 11/23/16- allergy profile total IgE 409 on Xolair, elevated specific IgE for dust mite and cockroach. EOS 3,300, Nl ANA and alpha-gal Derm Path report reviewed- Spongiotic/ eczema process- does not describe it as urticaria. Xolair was changed to [facility] June, 2018, to see if Xolair was causing her rash-no improvement after 5 months Office Spirometry 12/20/2017-very severe obstructive airways disease.  FVC 2.09/60%, FEV1 0.90/34%, ratio 0.43 Quantiferon tb Gold 06/22/17- Neg ---------------------------------------------------------------.   03/16/21-  81 year old female former smoker followed for severe asthma/COPD(formerly used Xolair/Nucala), complicated by allergic rhinitis, paroxysmal A. Fib/ Xarelto, CVA, rash/ Atopic Dermatitis, breast cancer R,  -Xarelto,  neb Duoneb, Wixela 250, ventolin hfa Covid vax-3 Phizer Flu vax- had -----Patient was stung by a bee yesterday and is requesting a depo shot. [place] sting yesterday evening.  No systemic reaction.  Today feels she may have more than usual chest congestion. Has been going to [facility] dermatology for her rash and is on daily Zyrtec. CXR 11/27/20 IMPRESSION: Stable bibasilar scarring.  No definite acute abnormality is noted.  06/01/21-  81 year old female former smoker followed for severe asthma/COPD(formerly used Xolair/Nucala), complicated by allergic rhinitis, paroxysmal A. Fib/ Xarelto, CVA, Rash/ Atopic Dermatitis, breast cancer R,  -Xarelto,  neb Duoneb, Wixela 250, ventolin hfa, Dupixent,  Covid vax-3 Phizer Flu vax- had Dermatology treats with Dupixent.  [facility] dermatology Dr. Clovis Riley is taking over her Dupixent injections for her skin.  She shows me extensive excoriation. Watery sniffle blamed on "pollen" but  otherwise she says she feels well.  Rarely needs nebulizer machine.  Very restricted social exposure-son still does most of her shopping.  Review of Systems-see HPI   + = positive Constitutional:   No-   weight loss, night sweats, fevers, chills, fatigue, lassitude. HEENT:   No-  headaches, difficulty swallowing, tooth/dental problems, sore throat,       No-  sneezing, +itching, ear ache,  nasal congestion, +post nasal drip,  CV:  No-   chest pain, orthopnea, PND, swelling in lower extremities, anasarca, dizziness, palpitations Resp:  + shortness of breath with exertion or at rest- unchanged             No-   productive cough,  + non-productive cough,  No-  coughing up of blood.              No-   change in color of mucus.  + wheezing.   Skin:  +rash or lesions. GI:  No-   heartburn, indigestion, abdominal pain, nausea, vomiting, GU:  MS:  +   joint pain or swelling.   Neuro- nothing unusual  Psych:  No- change in mood or affect. No depression or anxiety.  No memory loss  Objective:    General- Alert, Oriented, Affect-appropriate, Distress- none acute, Pleasant, + Overweight Skin- + numerous unremarkable keratoses on her back with areas of excoriation especially on back and thighs Lymphadenopathy- none Head- atraumatic            Eyes- Gross vision intact, PERRLA, conjunctivae clear secretions            Ears- Hearing, canals normal            Nose- Mild turbinate edema, No-Septal dev, mucus, polyps, erosion, perforation  Throat- Mallampati II , mucosa -not red, drainage- none, tonsils- atrophic Neck- flexible , trachea midline, no stridor , thyroid nl, carotid no bruit Chest - symmetrical excursion , unlabored           Heart/CV- RRR +, no murmur , no gallop  , no rub, nl s1 s2                           - JVD- none , edema- none, stasis changes- none, varices- none           Lung- +clear to P&A/ distant/unlabored, wheeze- none,  Cough-none, dullness-none, rub- none            Chest wall-  Abd-  Br/ Gen/ Rectal- Not done, not indicated Extrem- cyanosis- none, clubbing, none, atrophy- none, strength- nl Neuro- grossly intact to observation

## 2021-05-29 NOTE — Progress Notes (Deleted)
Patient ID: Laura Mendez, female    DOB: [birth year], 81 y.o.   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 Fib Labs 11/23/16- allergy profile total IgE 409 on Xolair, elevated specific IgE for dust mite and cockroach. EOS 3,300, Nl ANA and alpha-gal Derm Path report reviewed- Spongiotic/ eczema process- does not describe it as urticaria. Xolair was changed to [facility] June, 2018, to see if Xolair was causing her rash-no improvement after 5 months Office Spirometry 12/20/2017-very severe obstructive airways disease.  FVC 2.09/60%, FEV1 0.90/34%, ratio 0.43 Quantiferon tb Gold 06/22/17- Neg ---------------------------------------------------------------. 11/27/20- 81 year old female former smoker followed for severe asthma/COPD(formerly used Xolair/Nucala), complicated by allergic rhinitis, paroxysmal A. Fib/ Xarelto, CVA, rash, breast cancer R,  -Xarelto,  neb Duoneb, Wixela 250, ventolin hfa Covid vax-3 Phizer ------Sob, cough-clear Rescue inhaler 2x/ day, Wixela  03/16/21-  81 year old female former smoker followed for severe asthma/COPD(formerly used Xolair/Nucala), complicated by allergic rhinitis, paroxysmal A. Fib/ Xarelto, CVA, rash/ Atopic Dermatitis, breast cancer R,  -Xarelto,  neb Duoneb, Wixela 250, ventolin hfa Covid vax-3 Phizer Flu vax- had -----Patient was stung by a bee yesterday and is requesting a depo shot.   CXR 11/27/20 IMPRESSION: Stable bibasilar scarring.  No definite acute abnormality is noted.   Review of Systems-see HPI   + = positive Constitutional:   No-   weight loss, night sweats, fevers, chills, fatigue, lassitude. HEENT:   No-  headaches, difficulty swallowing, tooth/dental problems, sore throat,       No-  sneezing, +itching, ear ache,  nasal congestion, +post nasal drip,  CV:  No-   chest pain, orthopnea, PND, swelling in lower extremities, anasarca, dizziness,  palpitations Resp:  + shortness of breath with exertion or at rest- unchanged             No-   productive cough,  + non-productive cough,  No-  coughing up of blood.              No-   change in color of mucus.  + wheezing.   Skin:  +rash or lesions. GI:  No-   heartburn, indigestion, abdominal pain, nausea, vomiting, GU:  MS:  +   joint pain or swelling.   Neuro- nothing unusual  Psych:  No- change in mood or affect. No depression or anxiety.  No memory loss  Objective:     General- Alert, Oriented, Affect-appropriate, Distress- none acute, Pleasant, + Overweight Skin- + numerous unremarkable keratoses on her back with mild diffuse erythema suggesting excoriation. Lymphadenopathy- none Head- atraumatic            Eyes- Gross vision intact, PERRLA, conjunctivae clear secretions            Ears- Hearing, canals normal            Nose- Mild turbinate edema, No-Septal dev, mucus, polyps, erosion, perforation             Throat- Mallampati II , mucosa -not red, drainage- none, tonsils- atrophic Neck- flexible , trachea midline, no stridor , thyroid nl, carotid no bruit Chest - symmetrical excursion , unlabored           Heart/CV- RRR +, no murmur , no gallop  , no rub, nl s1 s2                           -  JVD- none , edema- none, stasis changes- none, varices- none           Lung- +clear to P&A/ distant/unlabored, wheeze- none,  cough+ minimal, dullness-none, rub- none           Chest wall-  Abd-  Br/ Gen/ Rectal- Not done, not indicated Extrem- cyanosis- none, clubbing, none, atrophy- none, strength- nl Neuro- grossly intact to observation

## 2021-05-29 NOTE — Assessment & Plan Note (Signed)
She will keep planned follow-up appointment

## 2021-06-01 ENCOUNTER — Ambulatory Visit: Payer: Medicare Other | Admitting: Internal Medicine

## 2021-06-01 ENCOUNTER — Encounter: Payer: Self-pay | Admitting: Internal Medicine

## 2021-06-01 ENCOUNTER — Other Ambulatory Visit: Payer: Self-pay

## 2021-06-01 DIAGNOSIS — J449 Chronic obstructive pulmonary disease, unspecified: Secondary | ICD-10-CM | POA: Diagnosis not present

## 2021-06-01 DIAGNOSIS — R21 Rash and other nonspecific skin eruption: Secondary | ICD-10-CM

## 2021-06-01 NOTE — Patient Instructions (Signed)
We can continue current meds  Please call if we can help 

## 2021-06-01 NOTE — Assessment & Plan Note (Signed)
Well controlled, uncomplicated.  Dupixent given for her skin quite likely helps this as well.  We reviewed her inhalers. Plan-no changes

## 2021-06-01 NOTE — Assessment & Plan Note (Signed)
She does not think there is a working diagnosis.  [facility] is taking over for [place] therapy.

## 2021-06-05 DIAGNOSIS — L2084 Intrinsic (allergic) eczema: Secondary | ICD-10-CM | POA: Diagnosis not present

## 2021-06-05 DIAGNOSIS — L299 Pruritus, unspecified: Secondary | ICD-10-CM | POA: Diagnosis not present

## 2021-06-26 ENCOUNTER — Other Ambulatory Visit: Payer: Self-pay

## 2021-06-26 ENCOUNTER — Ambulatory Visit (HOSPITAL_COMMUNITY)
Admission: EM | Admit: 2021-06-26 | Discharge: 2021-06-26 | Disposition: A | Discharge status: Home / Self Care | Payer: Medicare Other | Attending: Family Medicine | Admitting: Family Medicine

## 2021-06-26 ENCOUNTER — Encounter (HOSPITAL_COMMUNITY): Payer: Self-pay

## 2021-06-26 ENCOUNTER — Ambulatory Visit (INDEPENDENT_AMBULATORY_CARE_PROVIDER_SITE_OTHER): Payer: Medicare Other

## 2021-06-26 DIAGNOSIS — R5383 Other fatigue: Secondary | ICD-10-CM | POA: Diagnosis not present

## 2021-06-26 DIAGNOSIS — R0989 Other specified symptoms and signs involving the circulatory and respiratory systems: Secondary | ICD-10-CM

## 2021-06-26 DIAGNOSIS — R0789 Other chest pain: Secondary | ICD-10-CM | POA: Diagnosis not present

## 2021-06-26 DIAGNOSIS — J45909 Unspecified asthma, uncomplicated: Secondary | ICD-10-CM | POA: Diagnosis not present

## 2021-06-26 IMAGING — DX DG CHEST 2V
2 series · 2 of 2 positions shown · non-contrast
Comparison: [DATE]

CLINICAL DATA: Burning in chest, asthma

EXAM:
CHEST - 2 VIEW

[chest pa]
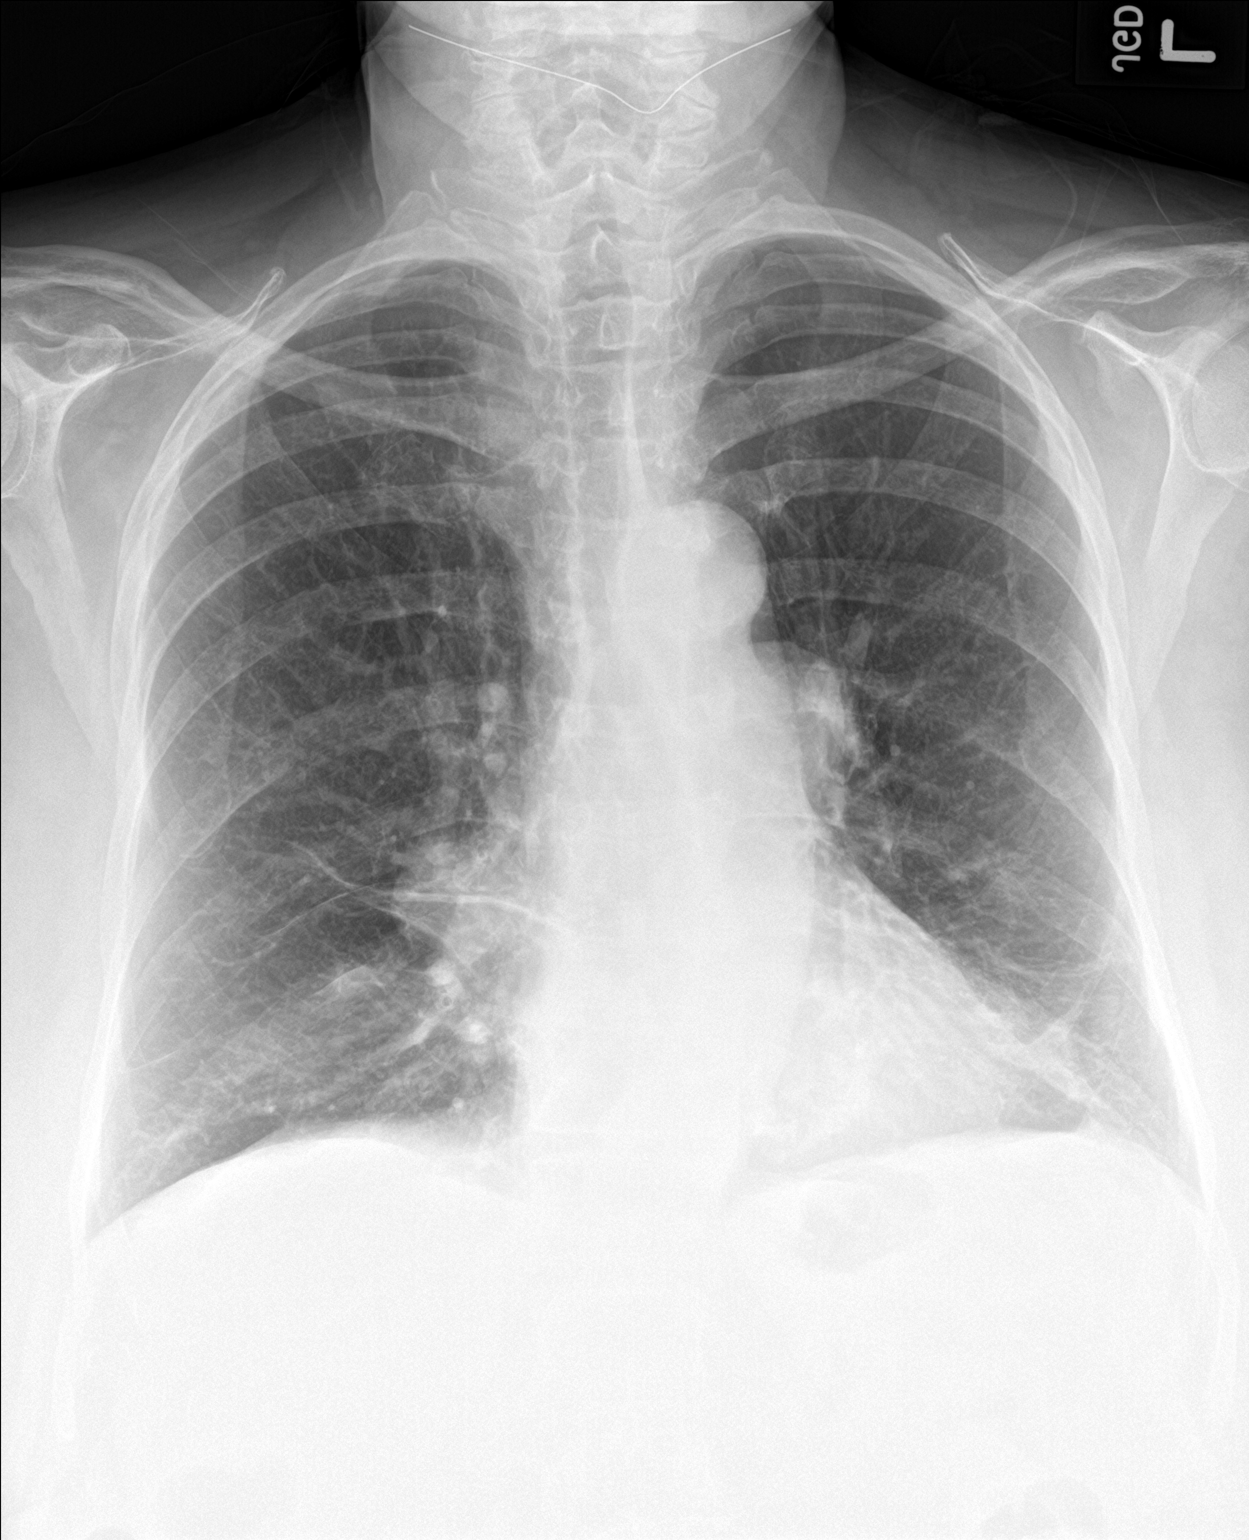

[chest lat]
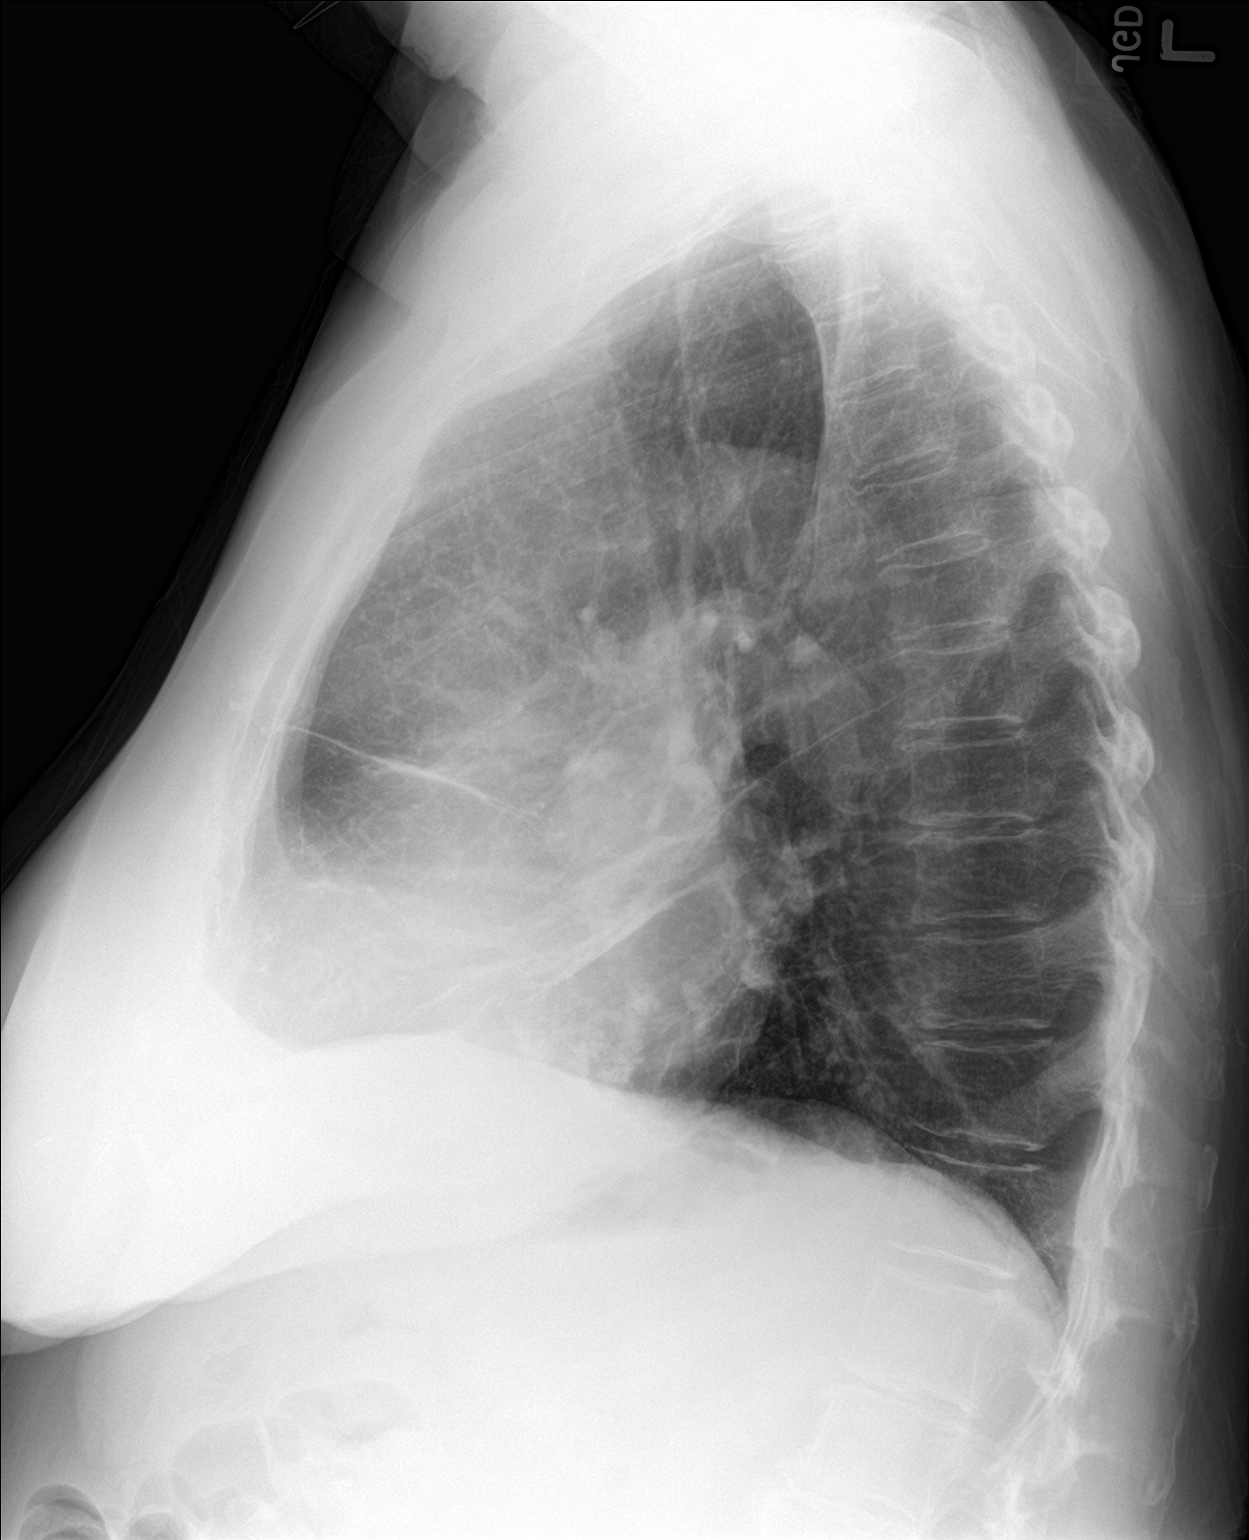

[2 of 2 positions shown; findings below may reference images not displayed]

FINDINGS: Linear densities in the lung bases could reflect atelectasis or
scarring. Heart is normal size. No effusions or acute bony
abnormalities.
IMPRESSION: Bibasilar scarring or atelectasis.

## 2021-06-26 MED ORDER — MOLNUPIRAVIR EUA 200MG CAPSULE: 4.0000 | ORAL_CAPSULE | Freq: Two times a day (BID) | ORAL | 0 refills | Status: DC

## 2021-06-26 NOTE — ED Triage Notes (Signed)
Pt presents for burning in chest and seeing spots. This started this morning. She does not report any pain at this time.

## 2021-06-26 NOTE — ED Provider Notes (Addendum)
[place]    CSN: [id number] Arrival date & time: 06/26/21  1815      History   Chief Complaint No chief complaint on file.   HPI Laura Mendez is a 81 y.o. female.   HPI Here with burning in her chest since this morning.  She is also had decreased appetite.  And malaise.  No fever or chills noted.  No vomiting or diarrhea.  Past medical history is significant for asthma and COPD.  She uses Advair twice daily and Ventolin as needed.  She also is on Xarelto for stroke prophylaxis as she has a history of atrial fibrillation.  She had a shot of Dupixent last week for a rare dermatitis that she has  Past Medical History:  Diagnosis Date   Asthma    Chronic airway obstruction, not elsewhere classified    Diastolic dysfunction without heart failure    Disorder of bone and cartilage, unspecified    Dysrhythmia    A-Fib   Family history of colon cancer    Family history of kidney cancer    Family history of ovarian cancer    Paroxysmal atrial flutter ([place])    Stroke ([place])    TIA   TIA (transient ischemic attack)    Unspecified asthma(493.90)     Patient Active Problem List   Diagnosis Date Noted   Insect sting 05/29/2021   Arthralgia 07/28/2020   Abnormal laboratory test 04/10/2020   Genetic testing 01/05/2018   Malignant neoplasm of lower-outer quadrant of right breast of female, estrogen receptor positive ([place]) 93/23/5573   Diastolic dysfunction 22/06/5425   Rash and nonspecific skin eruption 04/01/2016   Paroxysmal atrial flutter ([place]) 01/13/2016   Routine general medical examination at a health care facility 01/13/2016   Retinal vein occlusion 06/16/2015   Seasonal and perennial allergic rhinitis 08/06/2011   Asthma with COPD ([place]) 08/04/2007   Osteopenia 03/31/2007    Past Surgical History:  Procedure Laterality Date   ABDOMINAL HYSTERECTOMY     BREAST LUMPECTOMY Right 12/30/2017   BREAST LUMPECTOMY WITH RADIOACTIVE SEED LOCALIZATION  Right 12/30/2017   Procedure: BREAST LUMPECTOMY WITH RADIOACTIVE SEED LOCALIZATION X'S 3;  Surgeon: Rolm Bookbinder, MD;  Location: [facility] OR;  Service: General;  Laterality: Right;   BUNIONECTOMY Bilateral    EYE SURGERY Bilateral    cataract removal    OB History   No obstetric history on file.      Home Medications    Prior to Admission medications   Medication Sig Start Date End Date Taking? Authorizing Provider  molnupiravir EUA (LAGEVRIO) 200 mg CAPS capsule Take 4 capsules (800 mg total) by mouth 2 (two) times daily for 5 days. 06/26/21 07/01/21 Yes Barrett Henle, MD  albuterol (VENTOLIN HFA) 108 (90 Base) MCG/ACT inhaler INHALE 2 PUFFS INTO LUNGS EVERY 4 HOURS AS NEEDED 12/24/20   Baird Lyons D, MD  cholecalciferol (VITAMIN D) 1000 units tablet Take 1,000 Units by mouth daily.    [provider]  clobetasol cream (TEMOVATE) 0.62 % Apply 1 application topically as needed.    [provider]  fish oil-omega-3 fatty acids 1000 MG capsule Take 1 g by mouth daily.    [provider]  Fluticasone-Salmeterol (WIXELA INHUB) 250-50 MCG/DOSE AEPB Inhale 1 puff into the lungs 2 (two) times daily. 07/24/20   Baird Lyons D, MD  ipratropium-albuterol (DUONEB) 0.5-2.5 (3) MG/3ML SOLN Take 3 mLs by nebulization every 6 (six) hours as needed. 05/30/20   Deneise Lever,  MD  letrozole (FEMARA) 2.5 MG tablet Take 1 tablet by mouth once daily 08/21/20   Nicholas Lose, MD  MEGARED OMEGA-3 KRILL OIL 500 MG CAPS Take 500 mg by mouth daily.     [provider]  Multiple Vitamin (MULTIVITAMIN) tablet Take 1 tablet by mouth daily.    [provider]  predniSONE (STERAPRED UNI-PAK 21 TAB) 10 MG (21) TBPK tablet Take by mouth daily. Take 6 tabs by mouth daily  for 2 days, then 5 tabs for 2 days, then 4 tabs for 2 days, then 3 tabs for 2 days, 2 tabs for 2 days, then 1 tab by mouth daily for 2 days 04/13/21   Hans Eden, NP  rivaroxaban (XARELTO) 20 MG TABS  tablet TAKE 1 TABLET BY MOUTH ONCE DAILY WITH SUPPER 12/23/20   Jerline Pain, MD  triamcinolone ointment (KENALOG) 0.1 % Apply 1 application topically 2 (two) times daily as needed (for itching).    [provider]    Family History Family History  Problem Relation Age of Onset   COPD Father    Colon cancer Mother 101       mets to pancreas and liver   Kidney cancer Sister 21       d. 49   Lung cancer Sister        d. 3, smoker   Ovarian cancer Sister 50       d. 39   Cirrhosis Sister        d. 77   Melanoma Sister 44   Ovarian cancer Maternal Grandmother        d. 56   Heart disease Maternal Grandfather        rheumatic heart disease   Rectal cancer Paternal Grandfather        d. 73   Parkinson's disease Brother    Ovarian cancer Maternal Aunt    Colon cancer Maternal Uncle        d. 72-73   Heart attack Paternal Uncle        d. 56   Kidney cancer Brother 12   Colon cancer Maternal Uncle        d. 85   Cancer Other        MGMs pat 1/2 sister with uterine and rectal cancer   Colon cancer Cousin        d. 63 - mat first cousin   Breast cancer Neg Hx     Social History Social History   Tobacco Use   Smoking status: Former    Years: 0.00    Types: Cigarettes    Quit date: 05/24/1977    Years since quitting: 44.1   Smokeless tobacco: Never   Tobacco comments:    7/7 pt states she occasionally smoked, let cigs "burn" more than she smoked them  Vaping Use   Vaping Use: Never used  Substance Use Topics   Alcohol use: No   Drug use: No     Allergies   Clarithromycin, Ibuprofen, Codeine, Codeine phosphate, Diltiazem, Tape, Tapentadol, and Verapamil hcl er   Review of Systems Review of Systems   Physical Exam Triage Vital Signs ED Triage Vitals [06/26/21 1927]  Enc Vitals Group     BP (!) 176/72     Pulse Rate 70     Resp 18     Temp 98.8 F (37.1 C)     Temp Source Oral     SpO2 100 %     Weight  Height      Head Circumference       Peak Flow      Pain Score 0     Pain Loc      Pain Edu?      Excl. in [place]?    No data found.  Updated Vital Signs BP (!) 176/72 (BP Location: Left Arm)    Pulse 70    Temp 98.8 F (37.1 C) (Oral)    Resp 18    SpO2 100%   Visual Acuity Right Eye Distance:   Left Eye Distance:   Bilateral Distance:    Right Eye Near:   Left Eye Near:    Bilateral Near:     Physical Exam Vitals reviewed.  Constitutional:      General: She is not in acute distress.    Appearance: She is not ill-appearing, toxic-appearing or diaphoretic.  HENT:     Nose: Nose normal.     Mouth/Throat:     Mouth: Mucous membranes are moist.     Pharynx: No oropharyngeal exudate or posterior oropharyngeal erythema.  Eyes:     Extraocular Movements: Extraocular movements intact.     Conjunctiva/sclera: Conjunctivae normal.     Pupils: Pupils are equal, round, and reactive to light.  Cardiovascular:     Rate and Rhythm: Normal rate and regular rhythm.     Heart sounds: No murmur heard. Pulmonary:     Effort: Pulmonary effort is normal.     Breath sounds: Normal breath sounds. No wheezing, rhonchi or rales.  Chest:     Chest wall: No tenderness.  Musculoskeletal:     Cervical back: Neck supple.  Lymphadenopathy:     Cervical: No cervical adenopathy.  Skin:    Coloration: Skin is not jaundiced or pale.  Neurological:     General: No focal deficit present.     Mental Status: She is alert.  Psychiatric:        Behavior: Behavior normal.     UC Treatments / Results  Labs (all labs ordered are listed, but only abnormal results are displayed) Labs Reviewed - No data to display  EKG   Radiology DG Chest 2 View  Result Date: 06/26/2021 CLINICAL DATA:  Burning in chest, asthma EXAM: CHEST - 2 VIEW COMPARISON:  11/27/2020 FINDINGS: Linear densities in the lung bases could reflect atelectasis or scarring. Heart is normal size. No effusions or acute bony abnormalities. IMPRESSION: Bibasilar scarring or  atelectasis. Electronically Signed   By: Rolm Baptise M.D.   On: 06/26/2021 20:05    Procedures Procedures (including critical care time)  Medications Ordered in UC Medications - No data to display  Initial Impression / Assessment and Plan / UC Course  I have reviewed the triage vital signs and the nursing notes.  Pertinent labs & imaging results that were available during my care of the patient were reviewed by me and considered in my medical decision making (see chart for details).     Chest x-ray is negative for infiltrate.  There is some old scarring.  Discussed most likely viral nature of her symptoms.  She will continue to make sure she is drinking plenty of oral fluids and take her medicines as prescribed.  If she feels worse in any way she will proceed to the ER for possible further evaluation or imaging  She is going to do a home covid test when she gets home. Final Clinical Impressions(s) / UC Diagnoses   Final diagnoses:  Atypical chest  pain  Fatigue, unspecified type     Discharge Instructions      Your chest x-ray is negative.  If you were to be positive for COVID, you would start taking the Molnupiravir within 5 days of your symptoms starting. The dose of it is 4 capsules twice daily for 5 days. If you are worsening in any way please proceed to the emergency room     ED Prescriptions     Medication Sig Dispense Auth. Provider   molnupiravir EUA (LAGEVRIO) 200 mg CAPS capsule Take 4 capsules (800 mg total) by mouth 2 (two) times daily for 5 days. 40 capsule Windy Carina Gwenlyn Perking, MD      PDMP not reviewed this encounter.   Barrett Henle, MD 06/26/21 2026    Barrett Henle, MD 06/26/21 2028

## 2021-06-26 NOTE — Discharge Instructions (Addendum)
Your chest x-ray is negative.  If you were to be positive for COVID, you would start taking the Molnupiravir within 5 days of your symptoms starting. The dose of it is 4 capsules twice daily for 5 days. If you are worsening in any way please proceed to the emergency room

## 2021-06-28 ENCOUNTER — Emergency Department (HOSPITAL_COMMUNITY): Payer: Medicare Other

## 2021-06-28 ENCOUNTER — Other Ambulatory Visit: Payer: Self-pay

## 2021-06-28 ENCOUNTER — Encounter (HOSPITAL_COMMUNITY): Payer: Self-pay

## 2021-06-28 ENCOUNTER — Inpatient Hospital Stay (HOSPITAL_COMMUNITY)
Admission: EM | Admit: 2021-06-28 | Discharge: 2021-07-01 | DRG: 065 | Disposition: A | Discharge status: Rehabilitation Facility | Payer: Medicare Other | Attending: Internal Medicine | Admitting: Internal Medicine

## 2021-06-28 DIAGNOSIS — S8002XA Contusion of left knee, initial encounter: Secondary | ICD-10-CM

## 2021-06-28 DIAGNOSIS — Z20822 Contact with and (suspected) exposure to covid-19: Secondary | ICD-10-CM | POA: Diagnosis not present

## 2021-06-28 DIAGNOSIS — R6889 Other general symptoms and signs: Secondary | ICD-10-CM | POA: Diagnosis not present

## 2021-06-28 DIAGNOSIS — Z87892 Personal history of anaphylaxis: Secondary | ICD-10-CM | POA: Diagnosis not present

## 2021-06-28 DIAGNOSIS — I6381 Other cerebral infarction due to occlusion or stenosis of small artery: Secondary | ICD-10-CM | POA: Diagnosis not present

## 2021-06-28 DIAGNOSIS — C50511 Malignant neoplasm of lower-outer quadrant of right female breast: Secondary | ICD-10-CM

## 2021-06-28 DIAGNOSIS — I672 Cerebral atherosclerosis: Secondary | ICD-10-CM | POA: Diagnosis not present

## 2021-06-28 DIAGNOSIS — L309 Dermatitis, unspecified: Secondary | ICD-10-CM | POA: Diagnosis present

## 2021-06-28 DIAGNOSIS — R29810 Facial weakness: Secondary | ICD-10-CM | POA: Diagnosis not present

## 2021-06-28 DIAGNOSIS — Z043 Encounter for examination and observation following other accident: Secondary | ICD-10-CM | POA: Diagnosis not present

## 2021-06-28 DIAGNOSIS — Z7901 Long term (current) use of anticoagulants: Secondary | ICD-10-CM | POA: Diagnosis not present

## 2021-06-28 DIAGNOSIS — I480 Paroxysmal atrial fibrillation: Secondary | ICD-10-CM | POA: Diagnosis present

## 2021-06-28 DIAGNOSIS — R4781 Slurred speech: Secondary | ICD-10-CM | POA: Diagnosis not present

## 2021-06-28 DIAGNOSIS — J4489 Other specified chronic obstructive pulmonary disease: Secondary | ICD-10-CM | POA: Diagnosis present

## 2021-06-28 DIAGNOSIS — M1712 Unilateral primary osteoarthritis, left knee: Secondary | ICD-10-CM | POA: Diagnosis not present

## 2021-06-28 DIAGNOSIS — Z888 Allergy status to other drugs, medicaments and biological substances status: Secondary | ICD-10-CM

## 2021-06-28 DIAGNOSIS — I6310 Cerebral infarction due to embolism of unspecified precerebral artery: Secondary | ICD-10-CM

## 2021-06-28 DIAGNOSIS — I517 Cardiomegaly: Secondary | ICD-10-CM | POA: Diagnosis not present

## 2021-06-28 DIAGNOSIS — R29702 NIHSS score 2: Secondary | ICD-10-CM | POA: Diagnosis present

## 2021-06-28 DIAGNOSIS — R531 Weakness: Secondary | ICD-10-CM

## 2021-06-28 DIAGNOSIS — Z853 Personal history of malignant neoplasm of breast: Secondary | ICD-10-CM

## 2021-06-28 DIAGNOSIS — R21 Rash and other nonspecific skin eruption: Secondary | ICD-10-CM | POA: Diagnosis present

## 2021-06-28 DIAGNOSIS — Z79811 Long term (current) use of aromatase inhibitors: Secondary | ICD-10-CM | POA: Diagnosis not present

## 2021-06-28 DIAGNOSIS — Z170 Estrogen receptor positive status [ER+]: Secondary | ICD-10-CM | POA: Diagnosis not present

## 2021-06-28 DIAGNOSIS — Y92009 Unspecified place in unspecified non-institutional (private) residence as the place of occurrence of the external cause: Secondary | ICD-10-CM | POA: Diagnosis not present

## 2021-06-28 DIAGNOSIS — I63233 Cerebral infarction due to unspecified occlusion or stenosis of bilateral carotid arteries: Secondary | ICD-10-CM | POA: Diagnosis not present

## 2021-06-28 DIAGNOSIS — G8194 Hemiplegia, unspecified affecting left nondominant side: Secondary | ICD-10-CM | POA: Diagnosis present

## 2021-06-28 DIAGNOSIS — J449 Chronic obstructive pulmonary disease, unspecified: Secondary | ICD-10-CM | POA: Diagnosis not present

## 2021-06-28 DIAGNOSIS — I6389 Other cerebral infarction: Secondary | ICD-10-CM | POA: Diagnosis not present

## 2021-06-28 DIAGNOSIS — Z91048 Other nonmedicinal substance allergy status: Secondary | ICD-10-CM

## 2021-06-28 DIAGNOSIS — Z87891 Personal history of nicotine dependence: Secondary | ICD-10-CM

## 2021-06-28 DIAGNOSIS — Z825 Family history of asthma and other chronic lower respiratory diseases: Secondary | ICD-10-CM

## 2021-06-28 DIAGNOSIS — Z90710 Acquired absence of both cervix and uterus: Secondary | ICD-10-CM | POA: Diagnosis not present

## 2021-06-28 DIAGNOSIS — R471 Dysarthria and anarthria: Secondary | ICD-10-CM | POA: Diagnosis not present

## 2021-06-28 DIAGNOSIS — W19XXXA Unspecified fall, initial encounter: Secondary | ICD-10-CM

## 2021-06-28 DIAGNOSIS — Z79899 Other long term (current) drug therapy: Secondary | ICD-10-CM

## 2021-06-28 DIAGNOSIS — D6859 Other primary thrombophilia: Secondary | ICD-10-CM

## 2021-06-28 DIAGNOSIS — M79632 Pain in left forearm: Secondary | ICD-10-CM | POA: Diagnosis not present

## 2021-06-28 DIAGNOSIS — I6523 Occlusion and stenosis of bilateral carotid arteries: Secondary | ICD-10-CM | POA: Diagnosis not present

## 2021-06-28 DIAGNOSIS — I110 Hypertensive heart disease with heart failure: Secondary | ICD-10-CM | POA: Diagnosis present

## 2021-06-28 DIAGNOSIS — S51012A Laceration without foreign body of left elbow, initial encounter: Secondary | ICD-10-CM

## 2021-06-28 DIAGNOSIS — I6340 Cerebral infarction due to embolism of unspecified cerebral artery: Secondary | ICD-10-CM | POA: Diagnosis not present

## 2021-06-28 DIAGNOSIS — I5032 Chronic diastolic (congestive) heart failure: Secondary | ICD-10-CM | POA: Diagnosis not present

## 2021-06-28 DIAGNOSIS — I6319 Cerebral infarction due to embolism of other precerebral artery: Secondary | ICD-10-CM | POA: Diagnosis not present

## 2021-06-28 DIAGNOSIS — J3489 Other specified disorders of nose and nasal sinuses: Secondary | ICD-10-CM | POA: Diagnosis not present

## 2021-06-28 DIAGNOSIS — E785 Hyperlipidemia, unspecified: Secondary | ICD-10-CM | POA: Diagnosis not present

## 2021-06-28 DIAGNOSIS — M25562 Pain in left knee: Secondary | ICD-10-CM | POA: Diagnosis not present

## 2021-06-28 DIAGNOSIS — I4892 Unspecified atrial flutter: Secondary | ICD-10-CM

## 2021-06-28 DIAGNOSIS — Z881 Allergy status to other antibiotic agents status: Secondary | ICD-10-CM

## 2021-06-28 DIAGNOSIS — I639 Cerebral infarction, unspecified: Secondary | ICD-10-CM | POA: Diagnosis not present

## 2021-06-28 DIAGNOSIS — I63411 Cerebral infarction due to embolism of right middle cerebral artery: Secondary | ICD-10-CM | POA: Diagnosis not present

## 2021-06-28 DIAGNOSIS — M7989 Other specified soft tissue disorders: Secondary | ICD-10-CM | POA: Diagnosis not present

## 2021-06-28 DIAGNOSIS — G319 Degenerative disease of nervous system, unspecified: Secondary | ICD-10-CM | POA: Diagnosis not present

## 2021-06-28 DIAGNOSIS — Z885 Allergy status to narcotic agent status: Secondary | ICD-10-CM

## 2021-06-28 DIAGNOSIS — Z743 Need for continuous supervision: Secondary | ICD-10-CM | POA: Diagnosis not present

## 2021-06-28 DIAGNOSIS — M25462 Effusion, left knee: Secondary | ICD-10-CM | POA: Diagnosis not present

## 2021-06-28 DIAGNOSIS — R58 Hemorrhage, not elsewhere classified: Secondary | ICD-10-CM | POA: Diagnosis not present

## 2021-06-28 DIAGNOSIS — Z8249 Family history of ischemic heart disease and other diseases of the circulatory system: Secondary | ICD-10-CM

## 2021-06-28 LAB — URINALYSIS, MICROSCOPIC (REFLEX): Bacteria, UA: NONE SEEN

## 2021-06-28 LAB — CBC WITH DIFFERENTIAL/PLATELET
Abs Immature Granulocytes: 0.04 10*3/uL (ref 0.00–0.07)
Basophils Absolute: 0 10*3/uL (ref 0.0–0.1)
Basophils Relative: 0 %
Eosinophils Absolute: 0.1 10*3/uL (ref 0.0–0.5)
Eosinophils Relative: 2 %
HCT: 42 % (ref 36.0–46.0)
Hemoglobin: 13.5 g/dL (ref 12.0–15.0)
Immature Granulocytes: 0 %
Lymphocytes Relative: 14 %
Lymphs Abs: 1.3 10*3/uL (ref 0.7–4.0)
MCH: 29.7 pg (ref 26.0–34.0)
MCHC: 32.1 g/dL (ref 30.0–36.0)
MCV: 92.3 fL (ref 80.0–100.0)
Monocytes Absolute: 0.9 10*3/uL (ref 0.1–1.0)
Monocytes Relative: 10 %
Neutro Abs: 6.8 10*3/uL (ref 1.7–7.7)
Neutrophils Relative %: 74 %
Platelets: 285 10*3/uL (ref 150–400)
RBC: 4.55 MIL/uL (ref 3.87–5.11)
RDW: 12.7 % (ref 11.5–15.5)
WBC: 9.2 10*3/uL (ref 4.0–10.5)
nRBC: 0 % (ref 0.0–0.2)

## 2021-06-28 LAB — URINALYSIS, ROUTINE W REFLEX MICROSCOPIC
Bilirubin Urine: NEGATIVE
Glucose, UA: NEGATIVE mg/dL
Ketones, ur: NEGATIVE mg/dL
Leukocytes,Ua: NEGATIVE
Nitrite: NEGATIVE
Protein, ur: NEGATIVE mg/dL
Specific Gravity, Urine: 1.01 (ref 1.005–1.030)
pH: 7.5 (ref 5.0–8.0)

## 2021-06-28 LAB — I-STAT CHEM 8, ED
BUN: 8 mg/dL (ref 8–23)
Calcium, Ion: 1.22 mmol/L (ref 1.15–1.40)
Chloride: 102 mmol/L (ref 98–111)
Creatinine, Ser: 0.8 mg/dL (ref 0.44–1.00)
Glucose, Bld: 113 mg/dL — ABNORMAL HIGH (ref 70–99)
HCT: 42 % (ref 36.0–46.0)
Hemoglobin: 14.3 g/dL (ref 12.0–15.0)
Potassium: 3.6 mmol/L (ref 3.5–5.1)
Sodium: 141 mmol/L (ref 135–145)
TCO2: 27 mmol/L (ref 22–32)

## 2021-06-28 LAB — COMPREHENSIVE METABOLIC PANEL
ALT: 15 U/L (ref 0–44)
AST: 36 U/L (ref 15–41)
Albumin: 3.5 g/dL (ref 3.5–5.0)
Alkaline Phosphatase: 59 U/L (ref 38–126)
Anion gap: 11 (ref 5–15)
BUN: 6 mg/dL — ABNORMAL LOW (ref 8–23)
CO2: 24 mmol/L (ref 22–32)
Calcium: 9.3 mg/dL (ref 8.9–10.3)
Chloride: 105 mmol/L (ref 98–111)
Creatinine, Ser: 0.87 mg/dL (ref 0.44–1.00)
GFR, Estimated: >60 mL/min (ref >60)
Glucose, Bld: 113 mg/dL — ABNORMAL HIGH (ref 70–99)
Potassium: 3.6 mmol/L (ref 3.5–5.1)
Sodium: 140 mmol/L (ref 135–145)
Total Bilirubin: 0.7 mg/dL (ref 0.3–1.2)
Total Protein: 6.7 g/dL (ref 6.5–8.1)

## 2021-06-28 LAB — PROTIME-INR
INR: 1.7 — ABNORMAL HIGH (ref 0.8–1.2)
Prothrombin Time: 19.8 seconds — ABNORMAL HIGH (ref 11.4–15.2)

## 2021-06-28 LAB — RAPID URINE DRUG SCREEN, HOSP PERFORMED
Amphetamines: NOT DETECTED
Barbiturates: NOT DETECTED
Benzodiazepines: NOT DETECTED
Cocaine: NOT DETECTED
Opiates: NOT DETECTED
Tetrahydrocannabinol: NOT DETECTED

## 2021-06-28 LAB — ETHANOL: Alcohol, Ethyl (B): <10 mg/dL (ref <10)

## 2021-06-28 LAB — RESP PANEL BY RT-PCR (FLU A&B, COVID) ARPGX2
Influenza A by PCR: NEGATIVE
Influenza B by PCR: NEGATIVE
SARS Coronavirus 2 by RT PCR: NEGATIVE

## 2021-06-28 LAB — APTT: aPTT: 37 seconds — ABNORMAL HIGH (ref 24–36)

## 2021-06-28 IMAGING — CT CT KNEE*L* W/O CM
3 series · 15 of 33 positions shown, 18 images · non-contrast
Comparison: None.

CLINICAL DATA: Evaluate for hemarthrosis. Bruising and swelling to
LEFT knee.

EXAM:
CT OF THE  KNEE WITHOUT CONTRAST
TECHNIQUE: Multidetector CT imaging of the knee was performed according to the
standard protocol. Multiplanar CT image reconstructions were also
generated.
RADIATION DOSE REDUCTION: This exam was performed according to the
departmental dose-optimization program which includes automated
exposure control, adjustment of the mA and/or kV according to
patient size and/or use of iterative reconstruction technique.

[Series 4: lower ext 1.5 st · axial · 0.45mm/px · z∈[+545,+765]mm · 7 of 156 slices shown, 9 images]
[im 12/156  soft-tissue]
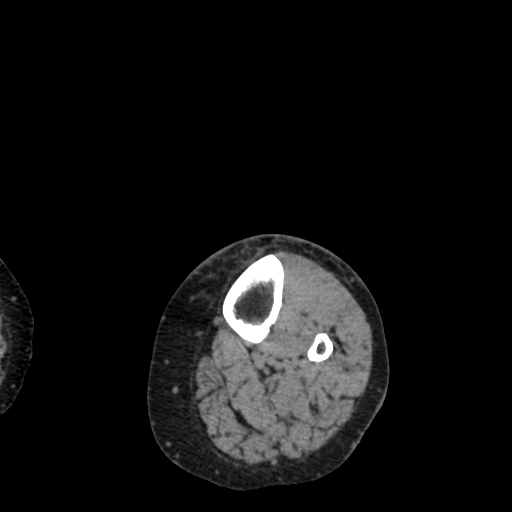
[im 12/156  bone]
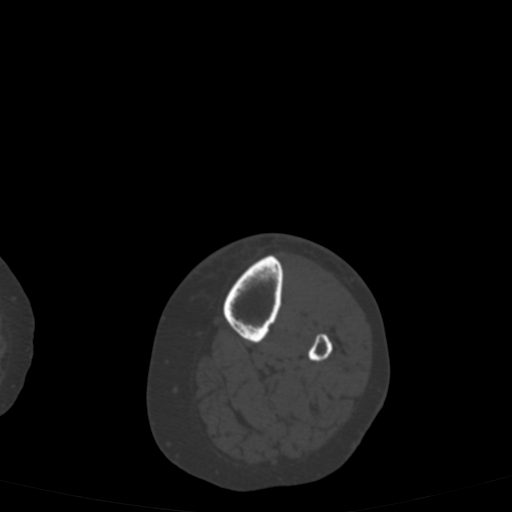
[im 36/156  bone]
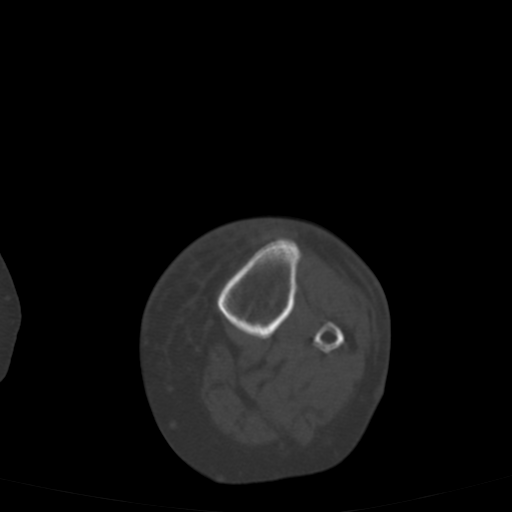
[im 60/156  bone]
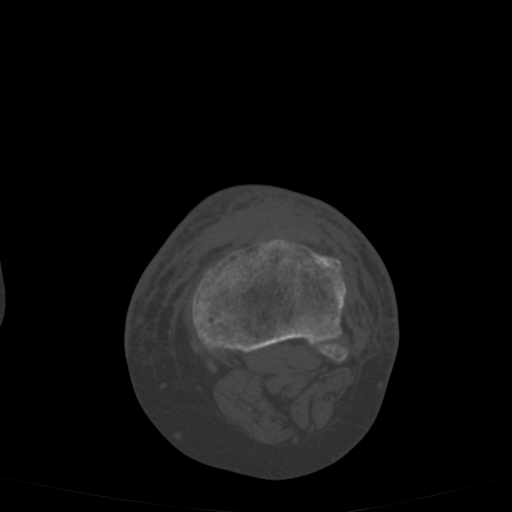
[im 84/156  bone]
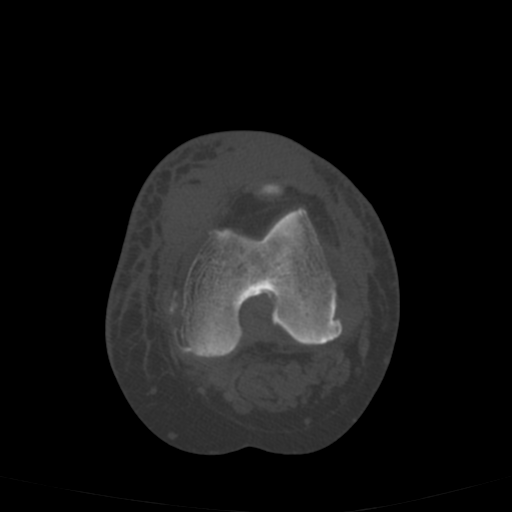
[im 96/156  soft-tissue]
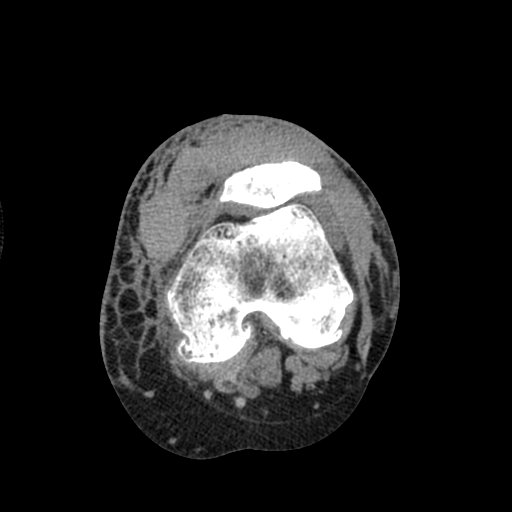
[im 96/156  bone]
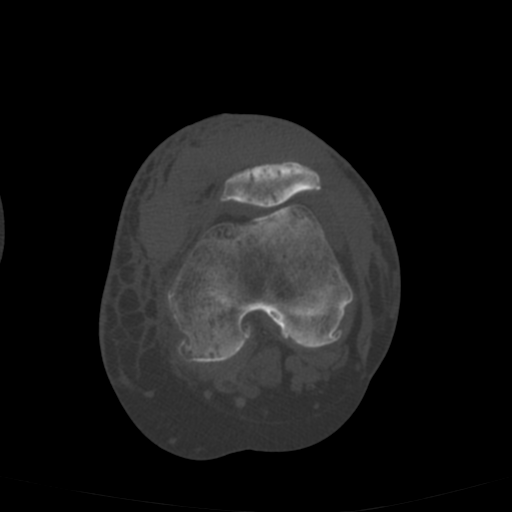
[im 120/156  bone]
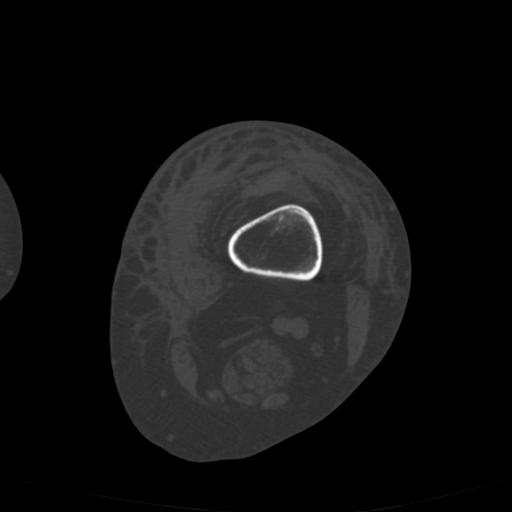
[im 144/156  bone]
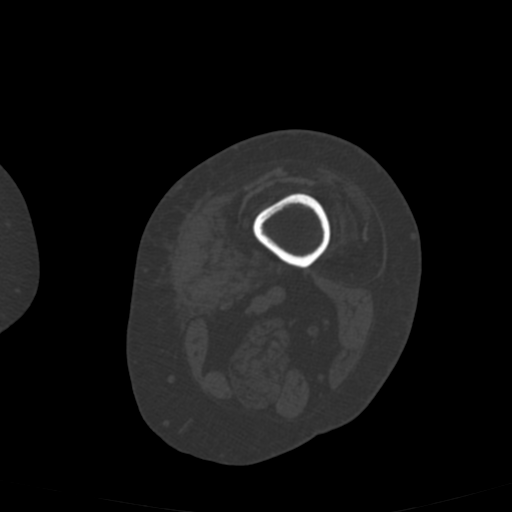

[Series 8: lower ext cor st · coronal · 0.51mm/px · 3 of 167 slices shown]
[im 34/167  bone]
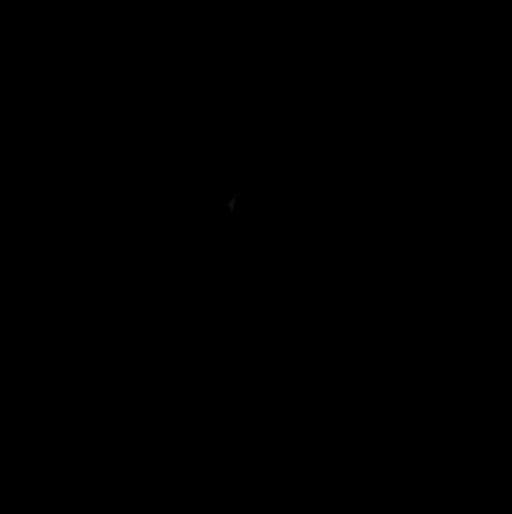
[im 67/167  bone]
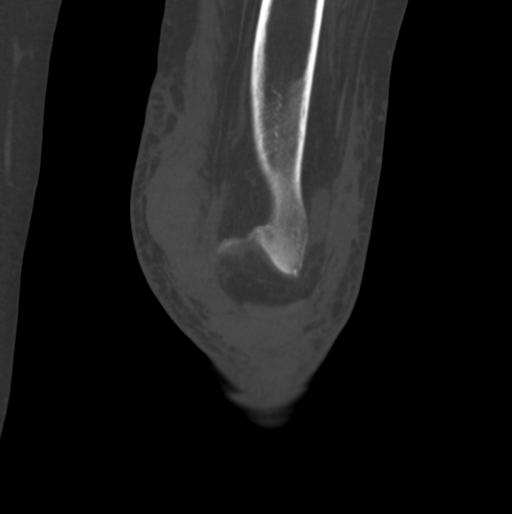
[im 100/167  bone]
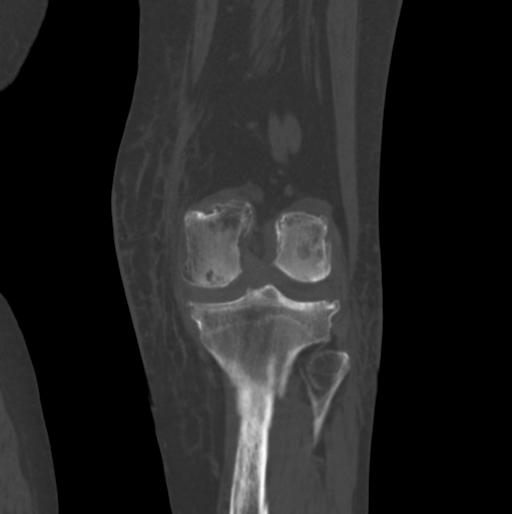

[Series 10: lower ext sag st · sagittal · 0.50mm/px · 5 of 167 slices shown, 6 images]
[im 56/167  bone]
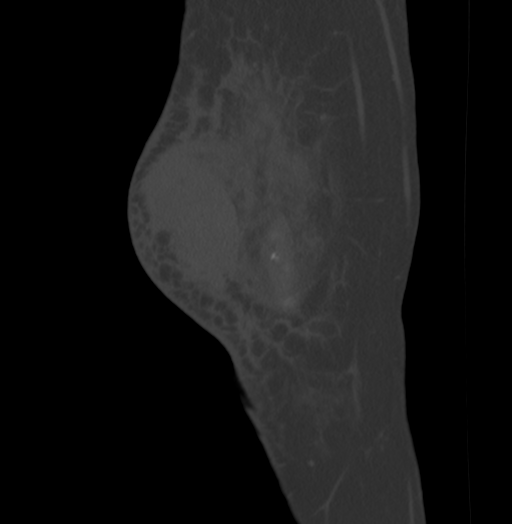
[im 70/167  bone]
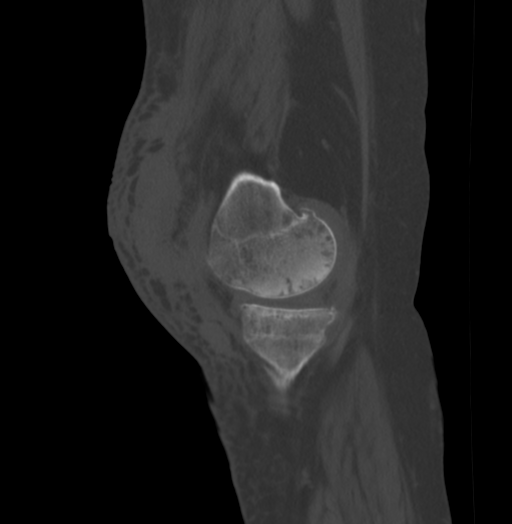
[im 84/167  soft-tissue]
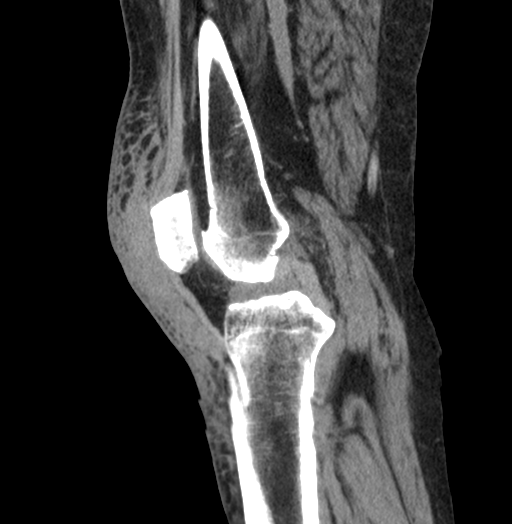
[im 84/167  bone]
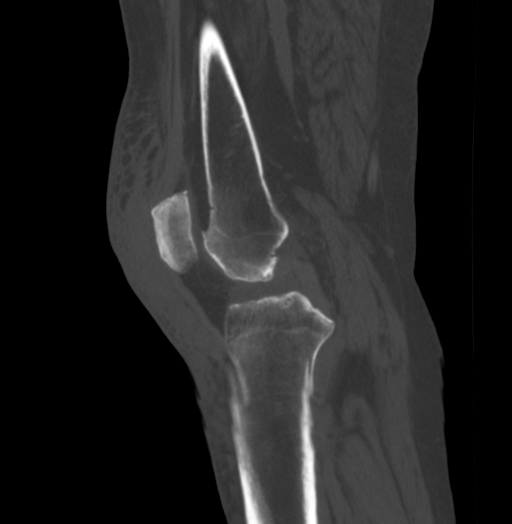
[im 97/167  bone]
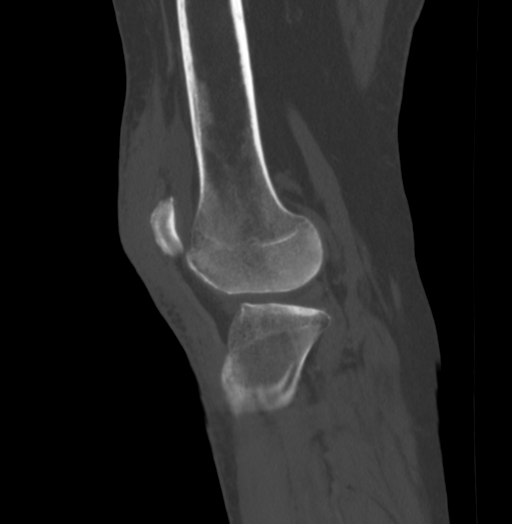
[im 111/167  bone]
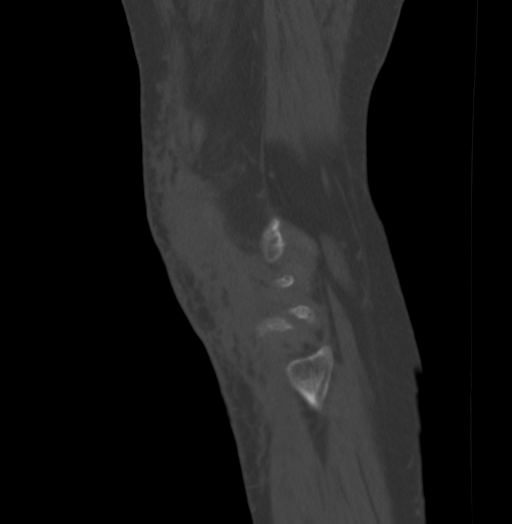

[15 of 33 positions shown; findings below may reference images not displayed]

FINDINGS: No osseous fracture line or displaced fracture fragment seen.
Tricompartmental degenerative osteoarthritic changes, with joint
space narrowings and osseous spurring, most pronounced at the
lateral compartment.

Small joint effusion, mostly localized to the suprapatellar fossa,
without layering density to suggest hemarthrosis.

Pronounced masslike thickening within the subcutaneous soft tissues
overlying the knee (series 4, images 55 through 98), measuring up to
2.5 cm thickness (series 4, image 67) most pronounced anteriorly and
medially. This could represent a soft tissue mass or
phlegmonous-like infectious collection. Additional ill-defined fluid
stranding throughout the subcutaneous soft tissues about the knee
and proximal tib-fib, suggesting underlying cellulitis.
IMPRESSION: 1. Pronounced masslike thickening within the subcutaneous soft
tissues overlying the knee, measuring up to 2.5 cm thickness, most
pronounced anteriorly and medially. This could represent a soft
tissue mass or phlegmonous-like infectious collection. If any recent
injury, this could represent soft tissue hematoma.
2. Additional ill-defined fluid stranding throughout the
subcutaneous soft tissues about the knee and proximal tib-fib,
suggesting underlying cellulitis.
3. Small joint effusion, mostly localized to the suprapatellar
fossa, without layering density to suggest hemarthrosis.
4. Tricompartmental degenerative osteoarthritic changes, most
pronounced at the lateral compartment.
5. No acute-appearing osseous abnormality.

## 2021-06-28 IMAGING — MR MR MRA HEAD W/O CM
2 series · 16 of 48 positions shown · non-contrast
Comparison: Noncontrast head CT and CT angiogram head/neck
performed earlier today [DATE].

CLINICAL DATA: Mental status change, unknown cause.

EXAM:
MRI HEAD WITHOUT CONTRAST
MRA HEAD WITHOUT CONTRAST
MRA NECK WITHOUT CONTRAST
TECHNIQUE: Multiplanar, multi-echo pulse sequences of the brain and surrounding
structures were acquired without intravenous contrast. Angiographic
images of the Circle of Willis were acquired using MRA technique
without intravenous contrast. Angiographic images of the neck were
acquired using MRA technique without intravenous contrast. Carotid
stenosis measurements (when applicable) are obtained utilizing
NASCET criteria, using the distal internal carotid diameter as the
denominator.

[Series 2: ax (id) · axial · 1.0mm · 0.43mm/px · z∈[-92,-9]mm · 14 of 176 slices shown]
[im 1/176]
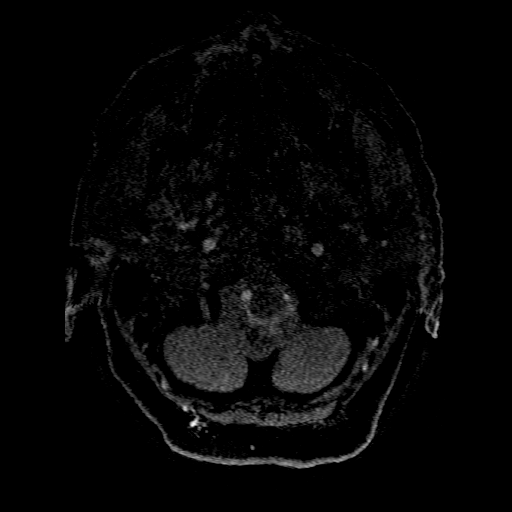
[im 4/176]
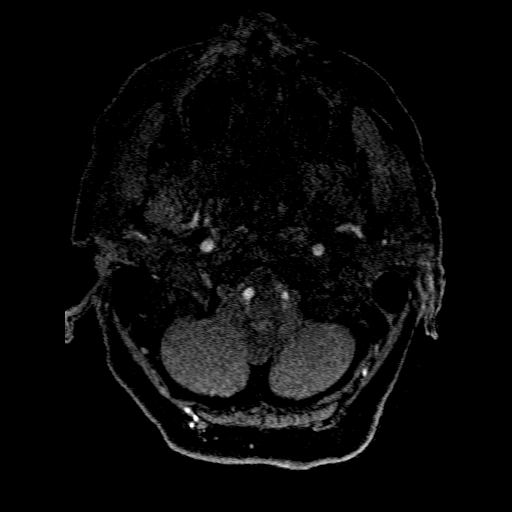
[im 8/176]
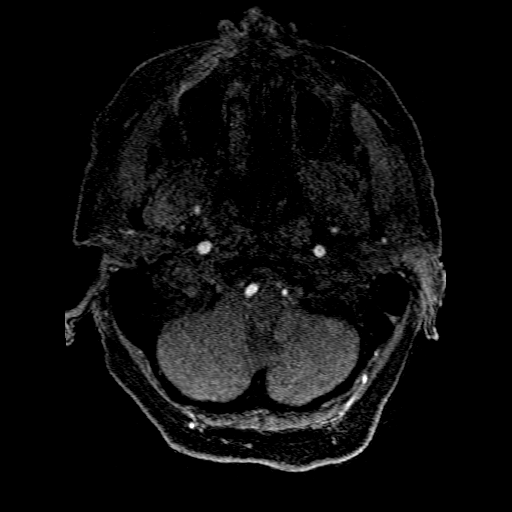
[im 12/176]
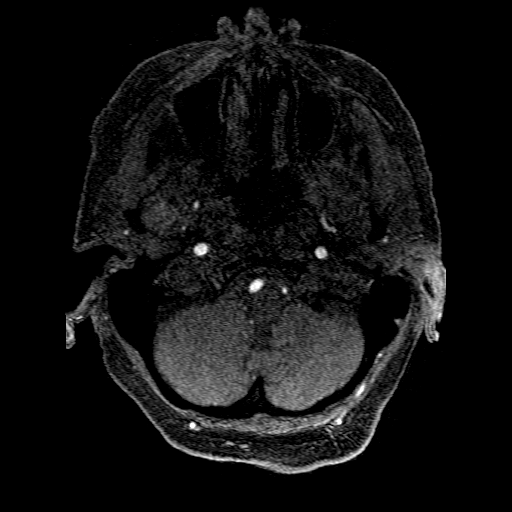
[im 28/176]
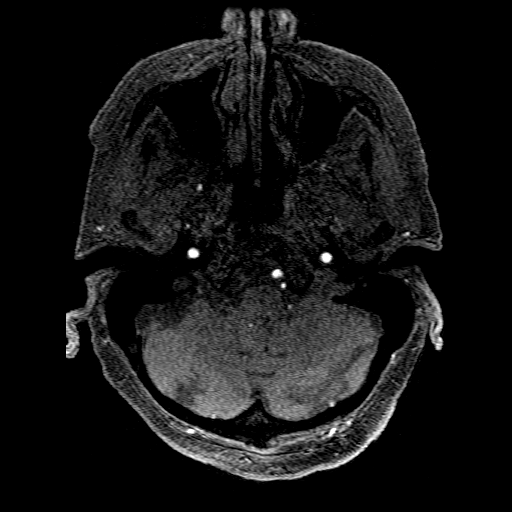
[im 32/176]
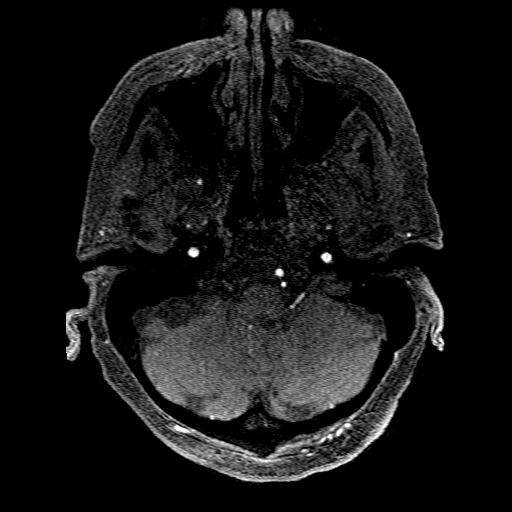
[im 55/176]
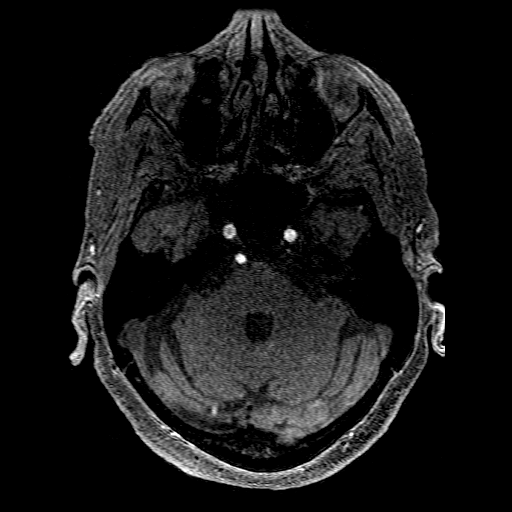
[im 78/176]
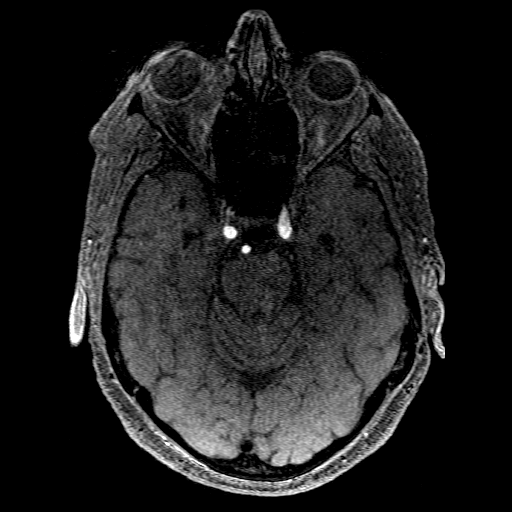
[im 90/176]
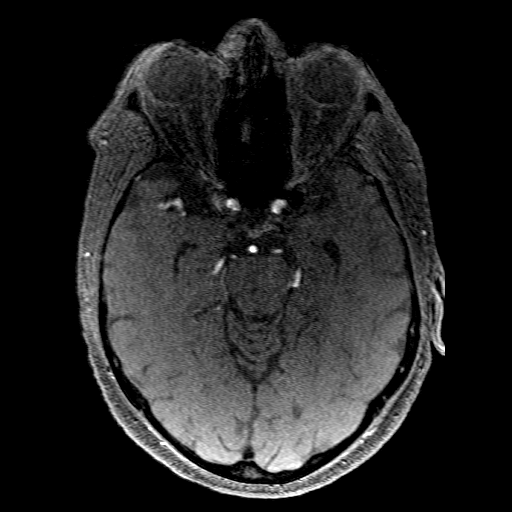
[im 98/176]
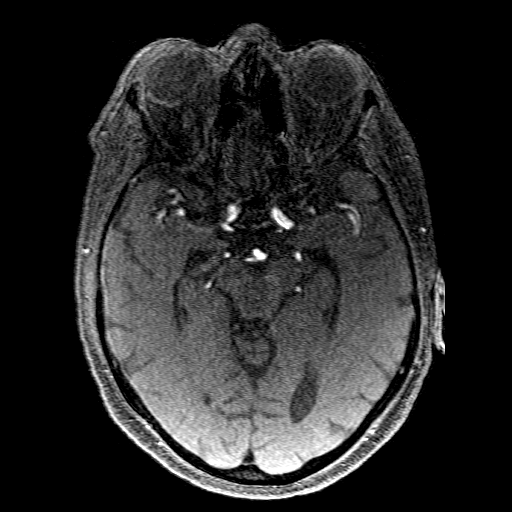
[im 121/176]
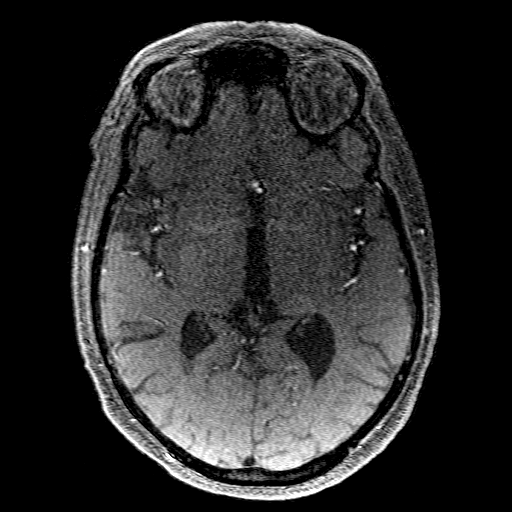
[im 144/176]
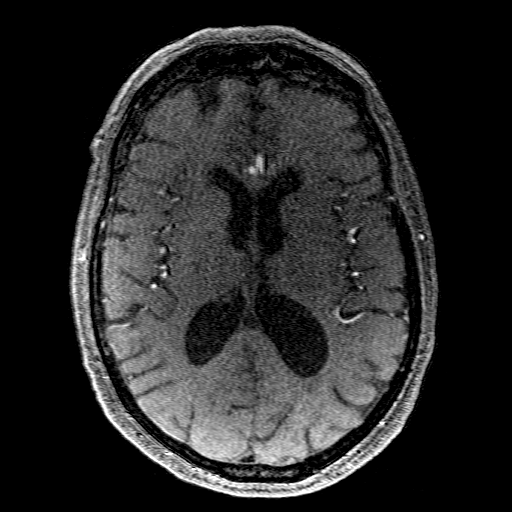
[im 148/176]
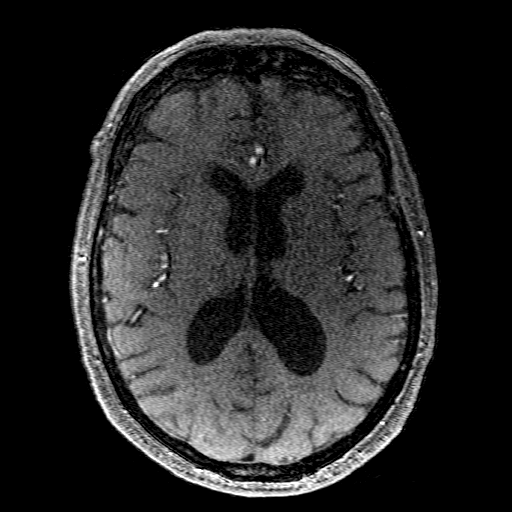
[im 168/176]
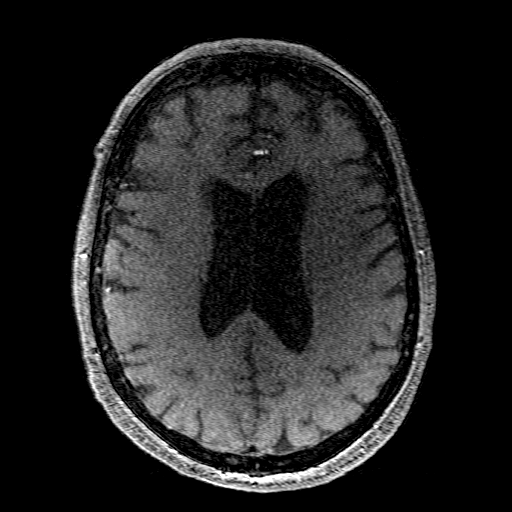

[Series 201: pjn:ax (id) · sagittal · 1.0mm · 0.43mm/px · 2 of 8 slices shown]
[im 1/8]
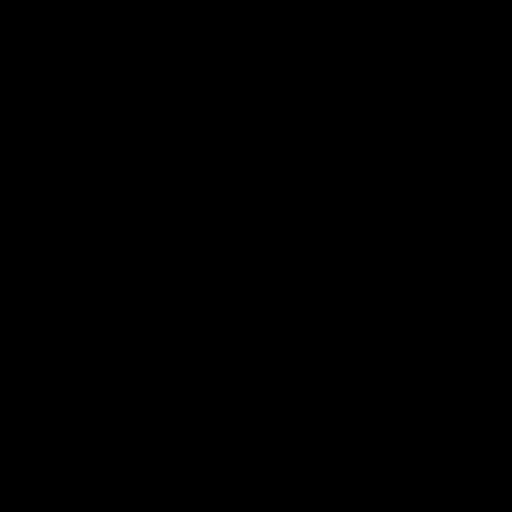
[im 8/8]
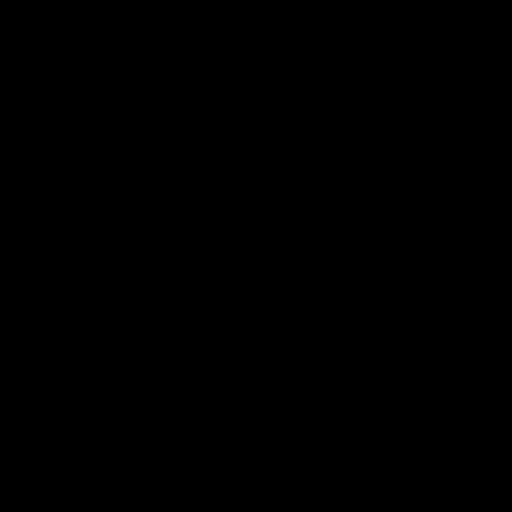

[16 of 48 positions shown; findings below may reference images not displayed]

FINDINGS: MRI HEAD FINDINGS

Brain:

Mild intermittent motion degradation.

Mild generalized cerebral and cerebellar atrophy.

There are fairly numerous scattered subcentimeter acute infarcts
within the bilateral cerebral hemispheres, right basal ganglia,
posterior limb of right internal capsule, left thalamus and within
the bilateral cerebellar hemispheres. Involvement of multiple
vascular territories raises suspicion for an embolic process.

Background minimal multifocal T2 FLAIR hyperintense signal
abnormality within the cerebral white matter, nonspecific but
compatible with chronic small vessel ischemic disease.

Chronic lacunar infarcts within the right (and possibly left)
caudate nuclei.

There are a few nonspecific supratentorial chronic parenchymal
microhemorrhages.

No evidence of an intracranial mass.

No extra-axial fluid collection.

No midline shift.

Vascular: Maintained flow voids within the proximal large arterial
vessels.

Skull and upper cervical spine: No focal suspicious marrow lesion.

Sinuses/Orbits: Visualized orbits show no acute finding. Bilateral
ocular lens replacements. Mild mucosal thickening within the left
frontal sinus and within a posterior left ethmoid air cell.

MRA HEAD FINDINGS

Mildly motion degraded exam.

Anterior circulation:

The intracranial internal carotid arteries are patent. Minimal
atherosclerotic irregularity of both vessels. The M1 middle cerebral
arteries are patent. No M2 proximal branch occlusion or high-grade
proximal stenosis is identified. Mild atherosclerotic irregularity
of the M2 and more distal MCA vessels, bilaterally. The anterior
cerebral arteries are patent. No intracranial aneurysm is
identified.

Posterior circulation:

The intracranial vertebral arteries are patent. The non dominant
left vertebral artery is developmentally diminutive beyond the
origin of the left PICA. The basilar artery is patent. The posterior
cerebral arteries are patent. Posterior communicating arteries are
present bilaterally.

Anatomic variants: None significant.

MRA NECK FINDINGS

Aortic arch: Motion degradation and noncontrast technique limits
evaluation of the aortic arch and origins of the great vessels of
the neck.

Right carotid system: Within described limitations, the CCA and ICA
are patent within the neck without hemodynamically significant
stenosis (50% or greater). Mild atherosclerotic plaque within the
proximal ICA.

Left carotid system: Within described limitations, the CCA and ICA
are patent within the neck without hemodynamically significant
stenosis. Mild atherosclerotic plaque within the carotid bifurcation
and proximal ICA.

Vertebral arteries: Within described limitations, the vertebral
arteries are patent within the neck without hemodynamically
significant stenosis. The right vertebral artery is dominant.
IMPRESSION: MRI brain:

1. Mildly motion degraded exam.
2. Fairly numerous subcentimeter acute infarcts within the bilateral
cerebral hemispheres, right basal ganglia, posterior limb of right
internal capsule, left thalamus and within the bilateral cerebellar
hemispheres. Involvement of multiple vascular territories raises
suspicion for an embolic process.
3. Background minimal chronic small-vessel ischemic changes within
the cerebral white matter.
4. Chronic lacunar infarcts within the right (and possibly left)
caudate nuclei.
5. Mild generalized parenchymal atrophy.
6. Mild paranasal sinus disease, as described.

MRA head:

1. No intracranial large vessel occlusion or proximal high-grade
arterial stenosis.
2. Intracranial atherosclerotic disease, as described.

MRA neck:

1. Motion degradation and non-contrast technique limits evaluation
of the aortic arch and proximal major branch vessels of the neck.
2. Within this limitation, the common carotid, internal carotid and
vertebral arteries are patent within neck without hemodynamically
significant stenosis. Mild atherosclerotic plaque within the
bilateral carotid systems within the neck, as described.

## 2021-06-28 IMAGING — DX DG FOREARM 2V*L*
1 series · 2 of 2 positions shown · non-contrast
Comparison: None.

CLINICAL DATA: Status post fall, left forearm pain

EXAM:
LEFT FOREARM - 2 VIEW

[Series 1: forearmbone · 0.14mm/px · 2 of 2 slices shown]
[im 1/2]
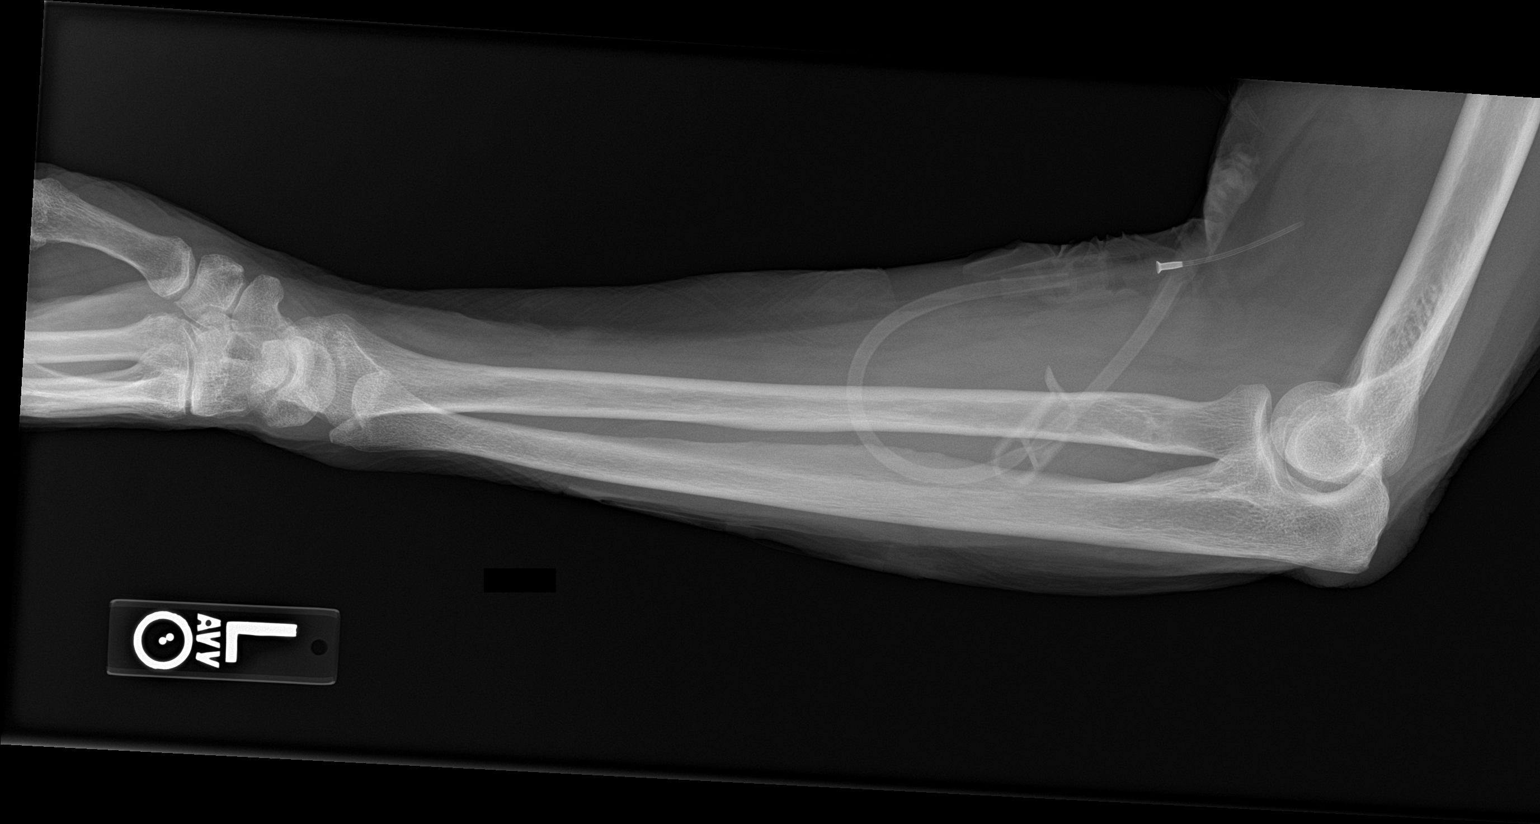
[im 2/2]
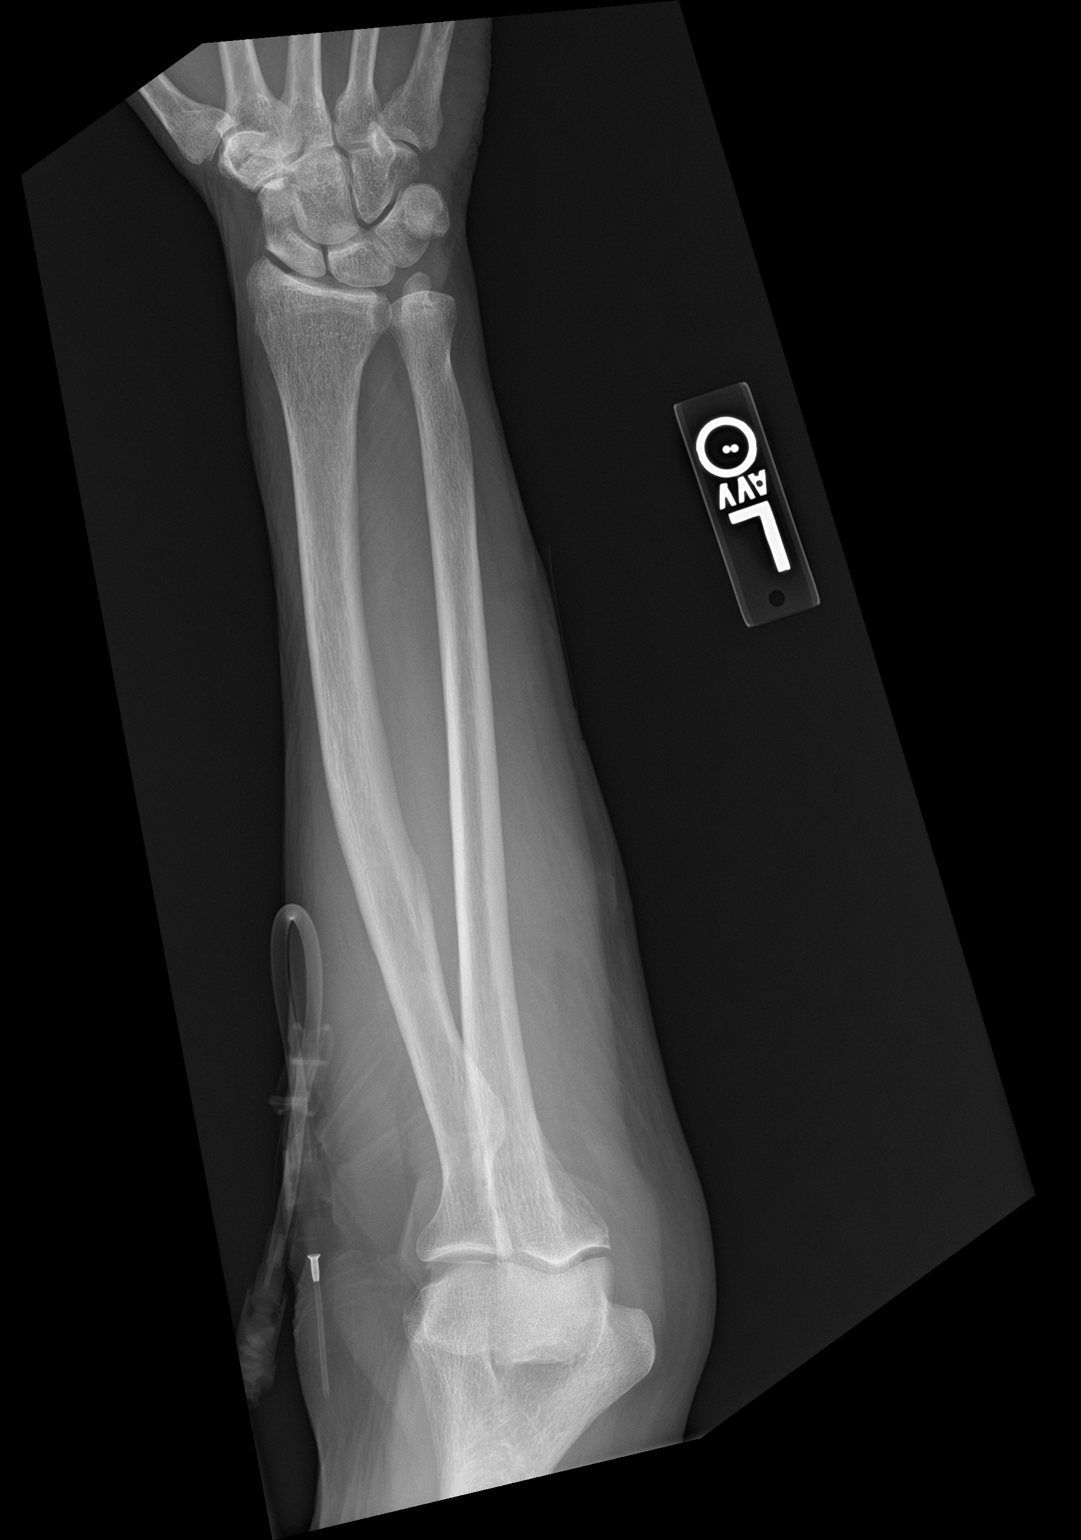

[2 of 2 positions shown; findings below may reference images not displayed]

FINDINGS: No acute fracture or dislocation. No aggressive osseous lesion.
Normal alignment.

Soft tissue are unremarkable. No radiopaque foreign body or soft
tissue emphysema.
IMPRESSION: 1.  No acute osseous injury of the left forearm.

## 2021-06-28 IMAGING — MR MR HEAD W/O CM
5 of 10 series · 25 of 48 positions shown · non-contrast
Comparison: Noncontrast head CT and CT angiogram head/neck
performed earlier today [DATE].

CLINICAL DATA: Mental status change, unknown cause.

EXAM:
MRI HEAD WITHOUT CONTRAST
MRA HEAD WITHOUT CONTRAST
MRA NECK WITHOUT CONTRAST
TECHNIQUE: Multiplanar, multi-echo pulse sequences of the brain and surrounding
structures were acquired without intravenous contrast. Angiographic
images of the Circle of Willis were acquired using MRA technique
without intravenous contrast. Angiographic images of the neck were
acquired using MRA technique without intravenous contrast. Carotid
stenosis measurements (when applicable) are obtained utilizing
NASCET criteria, using the distal internal carotid diameter as the
denominator.

[Series 5: DWI · axial · 3.0mm · 0.94mm/px · z∈[-108,+55]mm · 9 of 112 slices shown (1 of 2)]
[im 1/112]
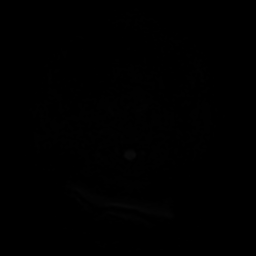
[im 14/112]
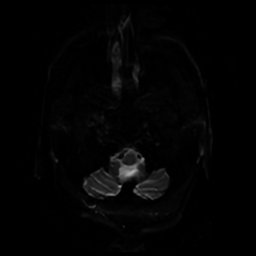
[im 28/112]
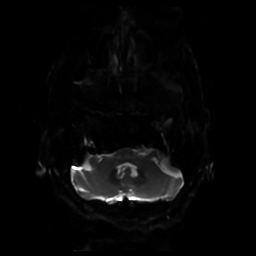
[im 42/112]
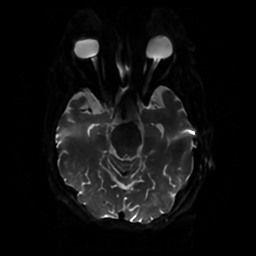
[im 56/112]
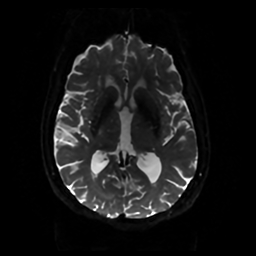
[im 70/112]
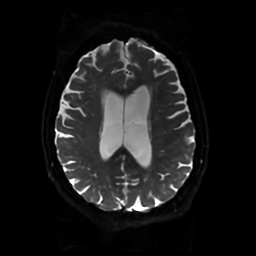
[im 84/112]
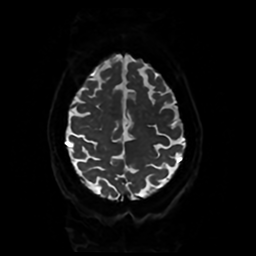
[im 98/112]
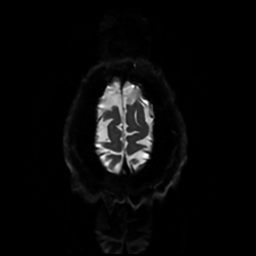
[im 112/112]
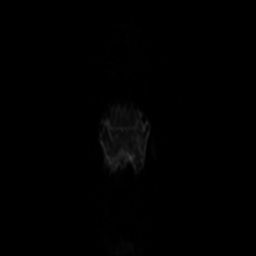

[Series 6: DWI · coronal · 4.0mm · 0.94mm/px · 7 of 78 slices shown (2 of 2)]
[im 1/78]
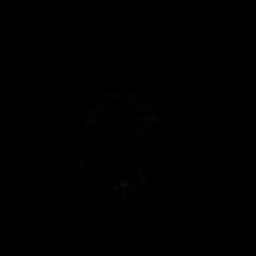
[im 13/78]
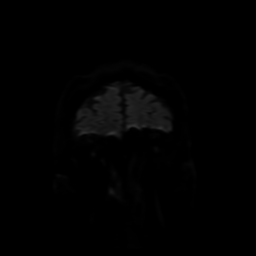
[im 26/78]
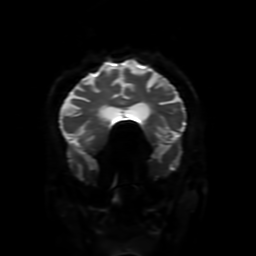
[im 39/78]
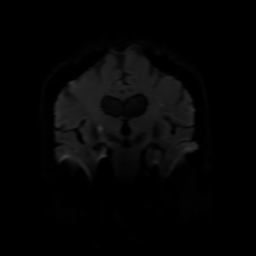
[im 52/78]
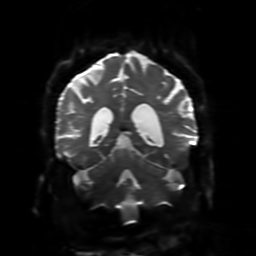
[im 65/78]
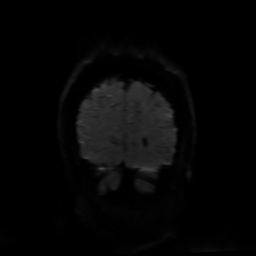
[im 78/78]
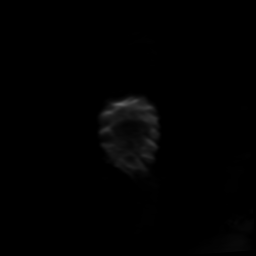

[Series 7: FLAIR · sagittal · 5.0mm · 0.23mm/px · 2 of 25 slices shown (1 of 2)]
[im 1/25]
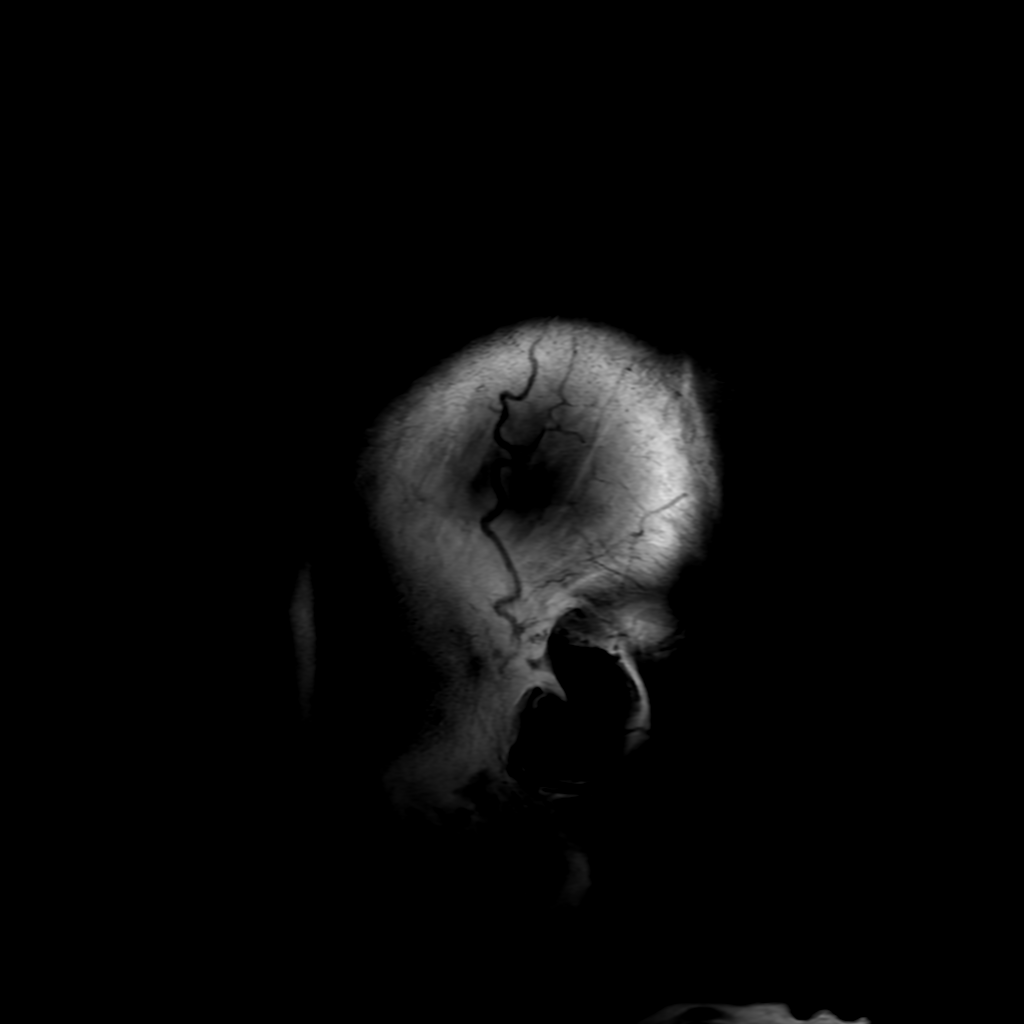
[im 25/25]
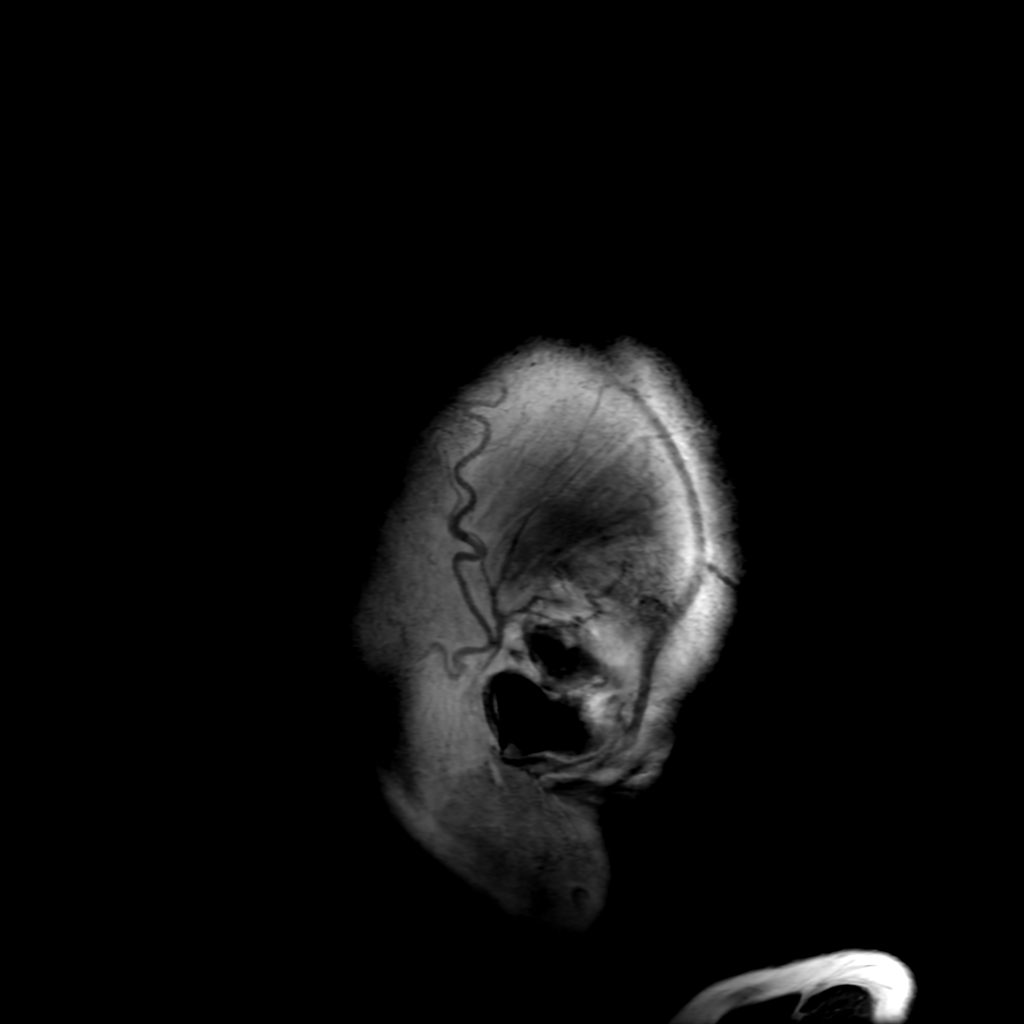

[Series 10: FLAIR · axial · 5.0mm · 0.47mm/px · z∈[-107,+53]mm · 3 of 28 slices shown (2 of 2)]
[im 1/28]
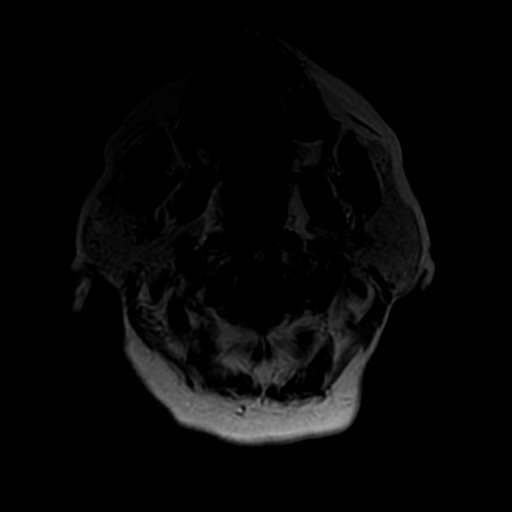
[im 14/28]
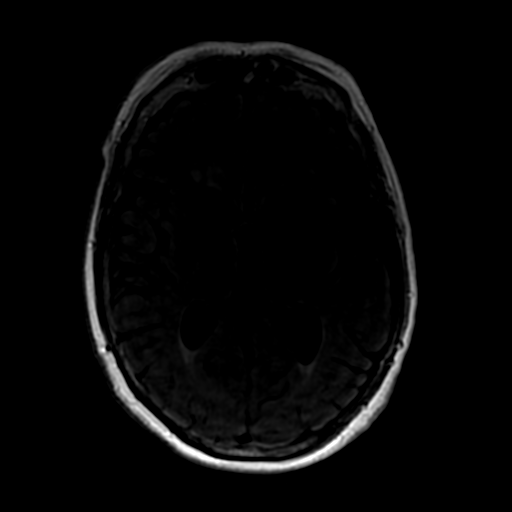
[im 28/28]
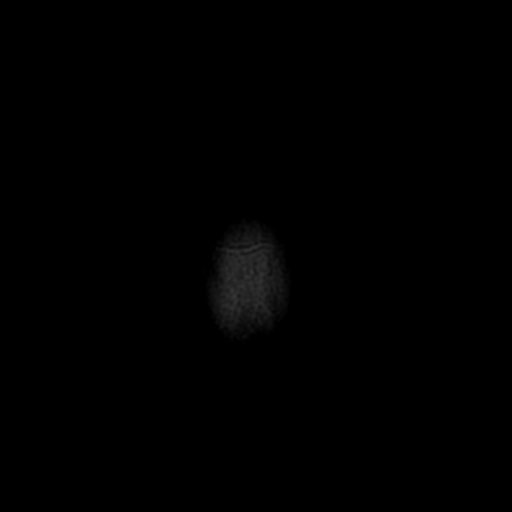

[Series 550: ADC · axial · 3.0mm · 0.94mm/px · z∈[-108,+13]mm · 4 of 56 slices shown]
[im 1/56]
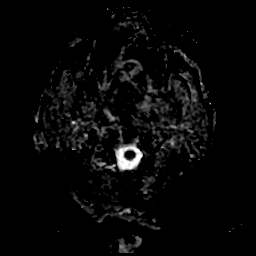
[im 14/56]
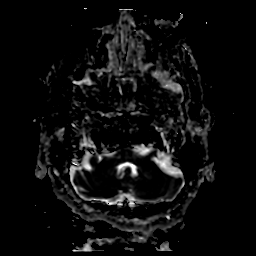
[im 28/56]
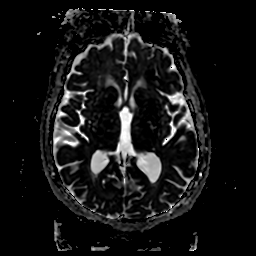
[im 42/56]
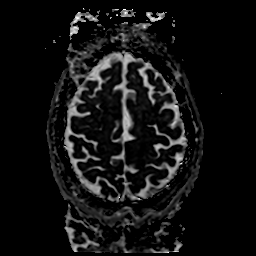

[25 of 48 positions shown; findings below may reference images not displayed]

FINDINGS: MRI HEAD FINDINGS

Brain:

Mild intermittent motion degradation.

Mild generalized cerebral and cerebellar atrophy.

There are fairly numerous scattered subcentimeter acute infarcts
within the bilateral cerebral hemispheres, right basal ganglia,
posterior limb of right internal capsule, left thalamus and within
the bilateral cerebellar hemispheres. Involvement of multiple
vascular territories raises suspicion for an embolic process.

Background minimal multifocal T2 FLAIR hyperintense signal
abnormality within the cerebral white matter, nonspecific but
compatible with chronic small vessel ischemic disease.

Chronic lacunar infarcts within the right (and possibly left)
caudate nuclei.

There are a few nonspecific supratentorial chronic parenchymal
microhemorrhages.

No evidence of an intracranial mass.

No extra-axial fluid collection.

No midline shift.

Vascular: Maintained flow voids within the proximal large arterial
vessels.

Skull and upper cervical spine: No focal suspicious marrow lesion.

Sinuses/Orbits: Visualized orbits show no acute finding. Bilateral
ocular lens replacements. Mild mucosal thickening within the left
frontal sinus and within a posterior left ethmoid air cell.

MRA HEAD FINDINGS

Mildly motion degraded exam.

Anterior circulation:

The intracranial internal carotid arteries are patent. Minimal
atherosclerotic irregularity of both vessels. The M1 middle cerebral
arteries are patent. No M2 proximal branch occlusion or high-grade
proximal stenosis is identified. Mild atherosclerotic irregularity
of the M2 and more distal MCA vessels, bilaterally. The anterior
cerebral arteries are patent. No intracranial aneurysm is
identified.

Posterior circulation:

The intracranial vertebral arteries are patent. The non dominant
left vertebral artery is developmentally diminutive beyond the
origin of the left PICA. The basilar artery is patent. The posterior
cerebral arteries are patent. Posterior communicating arteries are
present bilaterally.

Anatomic variants: None significant.

MRA NECK FINDINGS

Aortic arch: Motion degradation and noncontrast technique limits
evaluation of the aortic arch and origins of the great vessels of
the neck.

Right carotid system: Within described limitations, the CCA and ICA
are patent within the neck without hemodynamically significant
stenosis (50% or greater). Mild atherosclerotic plaque within the
proximal ICA.

Left carotid system: Within described limitations, the CCA and ICA
are patent within the neck without hemodynamically significant
stenosis. Mild atherosclerotic plaque within the carotid bifurcation
and proximal ICA.

Vertebral arteries: Within described limitations, the vertebral
arteries are patent within the neck without hemodynamically
significant stenosis. The right vertebral artery is dominant.
IMPRESSION: MRI brain:

1. Mildly motion degraded exam.
2. Fairly numerous subcentimeter acute infarcts within the bilateral
cerebral hemispheres, right basal ganglia, posterior limb of right
internal capsule, left thalamus and within the bilateral cerebellar
hemispheres. Involvement of multiple vascular territories raises
suspicion for an embolic process.
3. Background minimal chronic small-vessel ischemic changes within
the cerebral white matter.
4. Chronic lacunar infarcts within the right (and possibly left)
caudate nuclei.
5. Mild generalized parenchymal atrophy.
6. Mild paranasal sinus disease, as described.

MRA head:

1. No intracranial large vessel occlusion or proximal high-grade
arterial stenosis.
2. Intracranial atherosclerotic disease, as described.

MRA neck:

1. Motion degradation and non-contrast technique limits evaluation
of the aortic arch and proximal major branch vessels of the neck.
2. Within this limitation, the common carotid, internal carotid and
vertebral arteries are patent within neck without hemodynamically
significant stenosis. Mild atherosclerotic plaque within the
bilateral carotid systems within the neck, as described.

## 2021-06-28 IMAGING — CT CT ANGIO HEAD-NECK (W OR W/O PERF)
2 of 11 series · 6 of 36 positions shown · non-contrast
Comparison: No pertinent prior exams available for comparison.

CLINICAL DATA: Provided history: Neuro deficit, acute, stroke
suspected. Additional history provided: Fall, left arm weakness,
slurred speech.

EXAM:
CT ANGIOGRAPHY HEAD AND NECK
TECHNIQUE: Multidetector CT imaging of the head and neck was performed using
the standard protocol during bolus administration of intravenous
contrast. Multiplanar CT image reconstructions and MIPs were
obtained to evaluate the vascular anatomy. Carotid stenosis
measurements (when applicable) are obtained utilizing NASCET
criteria, using the distal internal carotid diameter as the
denominator.

[Series 10: sag soft · sagittal · 0.34mm/px · 1 of 57 slices shown]
[im 9/57  soft-tissue]
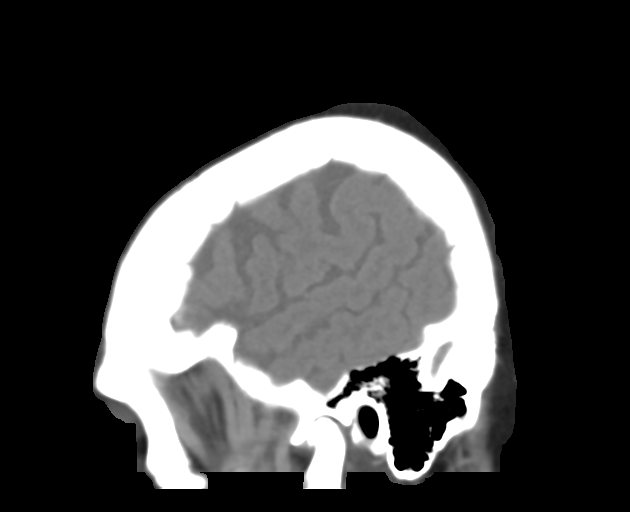

[Series 11: ax thins · axial · 0.39mm/px · z∈[-271,-52]mm · 5 of 329 slices shown]
[im 55/329  soft-tissue]
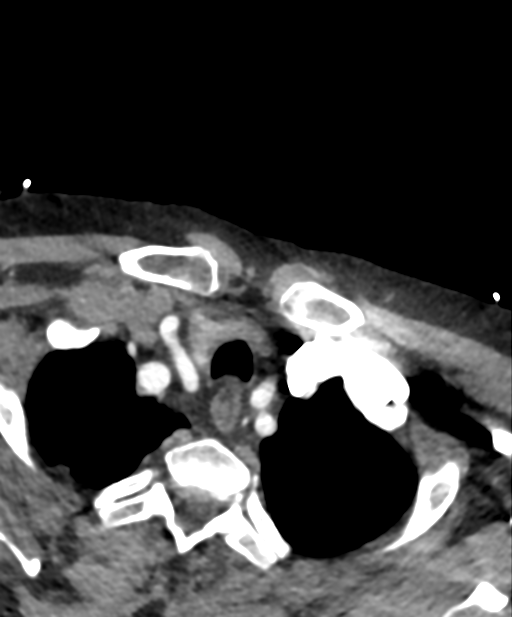
[im 110/329  bone]
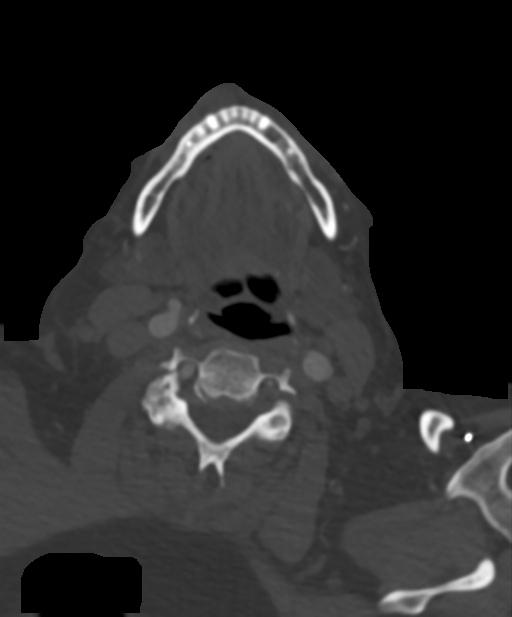
[im 165/329  soft-tissue]
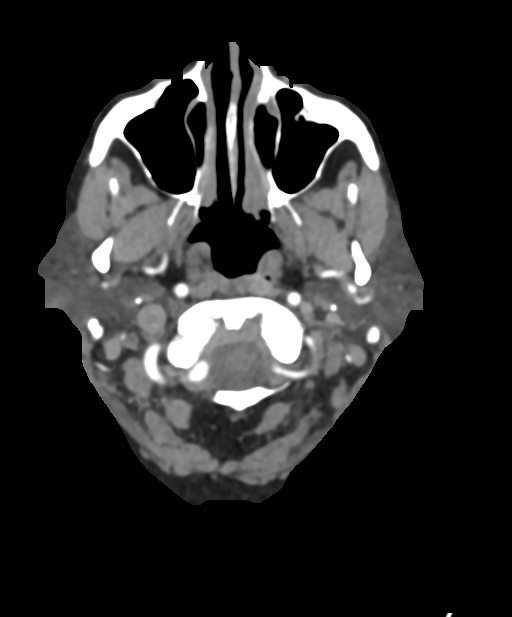
[im 219/329  bone]
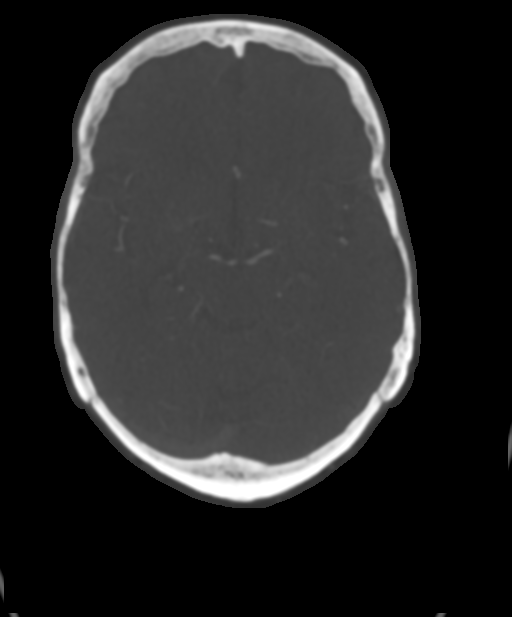
[im 274/329  soft-tissue]
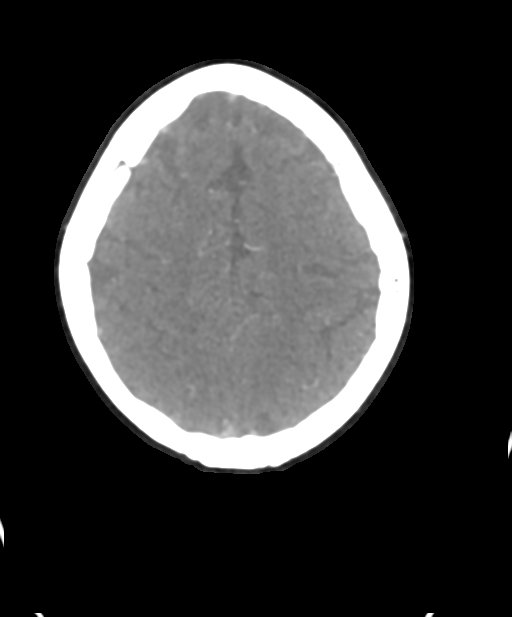

[6 of 36 positions shown; findings below may reference images not displayed]

RADIATION DOSE REDUCTION: This exam was performed according to the
departmental dose-optimization program which includes automated
exposure control, adjustment of the mA and/or kV according to
patient size and/or use of iterative reconstruction technique.

CONTRAST:  75mL OMNIPAQUE IOHEXOL 350 MG/ML SOLN
FINDINGS: CT HEAD FINDINGS

Brain:

Mild generalized cerebral and cerebellar atrophy.

Chronic lacunar infarct within the right caudate head.

There is no acute intracranial hemorrhage.

No demarcated cortical infarct.

No extra-axial fluid collection.

No evidence of an intracranial mass.

No midline shift.

Vascular: No hyperdense vessel.  Atherosclerotic calcifications.

Skull: Normal. Negative for fracture or focal lesion.

Sinuses: Minimal scattered paranasal sinus mucosal thickening.

Orbits: No orbital mass or acute orbital finding.

Review of the MIP images confirms the above findings

CTA NECK FINDINGS

Aortic arch: The left vertebral artery arises directly from the
aortic arch. Atherosclerotic plaque within the visualized aortic
arch and proximal major branch vessels of the neck. Streak and beam
hardening artifact arising from a dense left-sided contrast bolus
partially obscures the left subclavian artery. Within this
limitation, there is no appreciable hemodynamically significant
innominate or proximal subclavian artery stenosis.

Right carotid system: CCA and ICA patent within the neck without
significant stenosis (50% or greater). Mild atherosclerotic plaque a
within the proximal ICA.

Left carotid system: CCA and ICA patent within the neck without
significant stenosis (50% or greater). Mild atherosclerotic plaque
about the carotid bifurcation and within the proximal ICA.

Vertebral arteries: Vertebral arteries patent within the neck
without significant stenosis. Right vertebral artery dominant.

Skeleton: Cervical spine separately reported on concurrently
performed CT of the cervical spine. Elsewhere, no acute bony
abnormality is identified.

Other neck: No neck mass or cervical lymphadenopathy.

Upper chest: No consolidation within the imaged lung apices.
Centrilobular emphysema.

Review of the MIP images confirms the above findings

CTA HEAD FINDINGS

Anterior circulation:

The intracranial internal carotid arteries are patent. Nonstenotic
calcified plaque within both vessels. The M1 middle cerebral
arteries are patent. Atherosclerotic irregularity of the M2 and more
distal MCA vessels, bilaterally. No M2 proximal branch occlusion or
high-grade proximal stenosis is identified. The anterior cerebral
arteries are patent. No intracranial aneurysm is identified.

Posterior circulation:

The non dominant left vertebral artery terminates predominantly as
the left PICA. The dominant intracranial right vertebral artery is
patent without stenosis. The basilar artery is patent. The posterior
cerebral arteries are patent.

Venous sinuses: Within the limitations of contrast timing, no
convincing thrombus.

Anatomic variants: Posterior communicating arteries are present
bilaterally.

Review of the MIP images confirms the above findings
IMPRESSION: CT head:

1. No evidence of acute intracranial abnormality.
2. Chronic lacunar infarct within the right caudate head.
3. Mild generalized cerebral and cerebellar atrophy.

CTA neck:

1. The common carotid, internal carotid and vertebral arteries are
patent within the neck without hemodynamically significant stenosis.
Mild atherosclerotic plaque within the bilateral carotid systems
within the neck, as described.
2. Aortic Atherosclerosis ([T2]-[T2]) and Emphysema ([T2]-[T2]).

CTA head:

1. No intracranial large vessel occlusion or proximal high-grade
arterial stenosis identified.
2. Nonstenotic atherosclerotic plaque within the intracranial ICAs.
3. Atherosclerotic irregularity of the M2 and more distal MCA
vessels, bilaterally.

## 2021-06-28 IMAGING — DX DG KNEE COMPLETE 4+V*L*
1 series · 4 of 4 positions shown · non-contrast
Comparison: None.

CLINICAL DATA: Status post fall.  Left arm pain.  Left knee pain.

EXAM:
LEFT KNEE - COMPLETE 4+ VIEW

[Series 1: knee · 0.14mm/px · 4 of 4 slices shown]
[im 1/4]
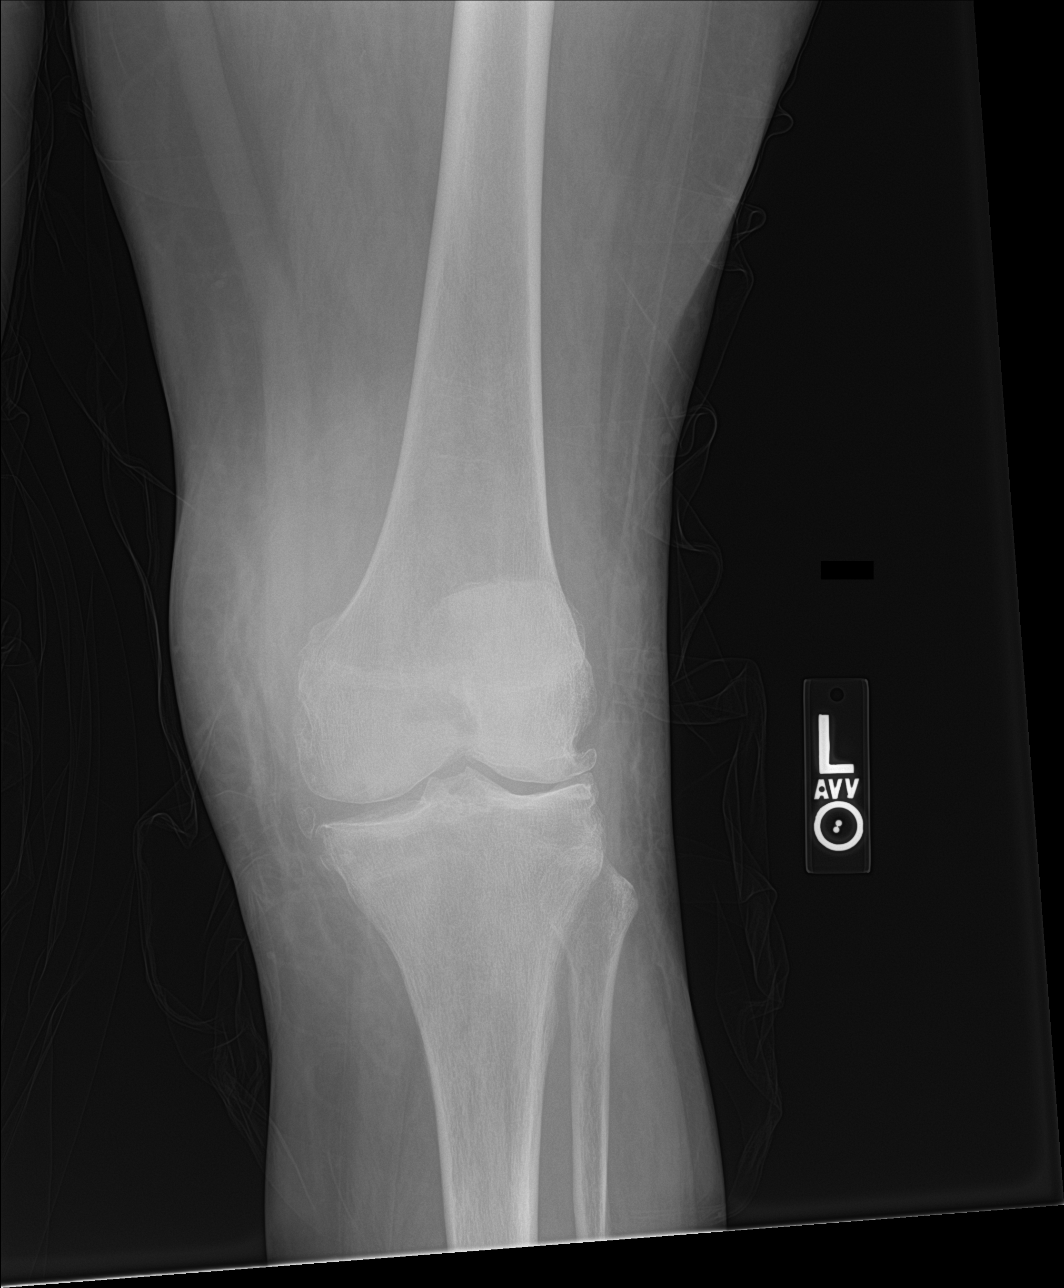
[im 2/4]
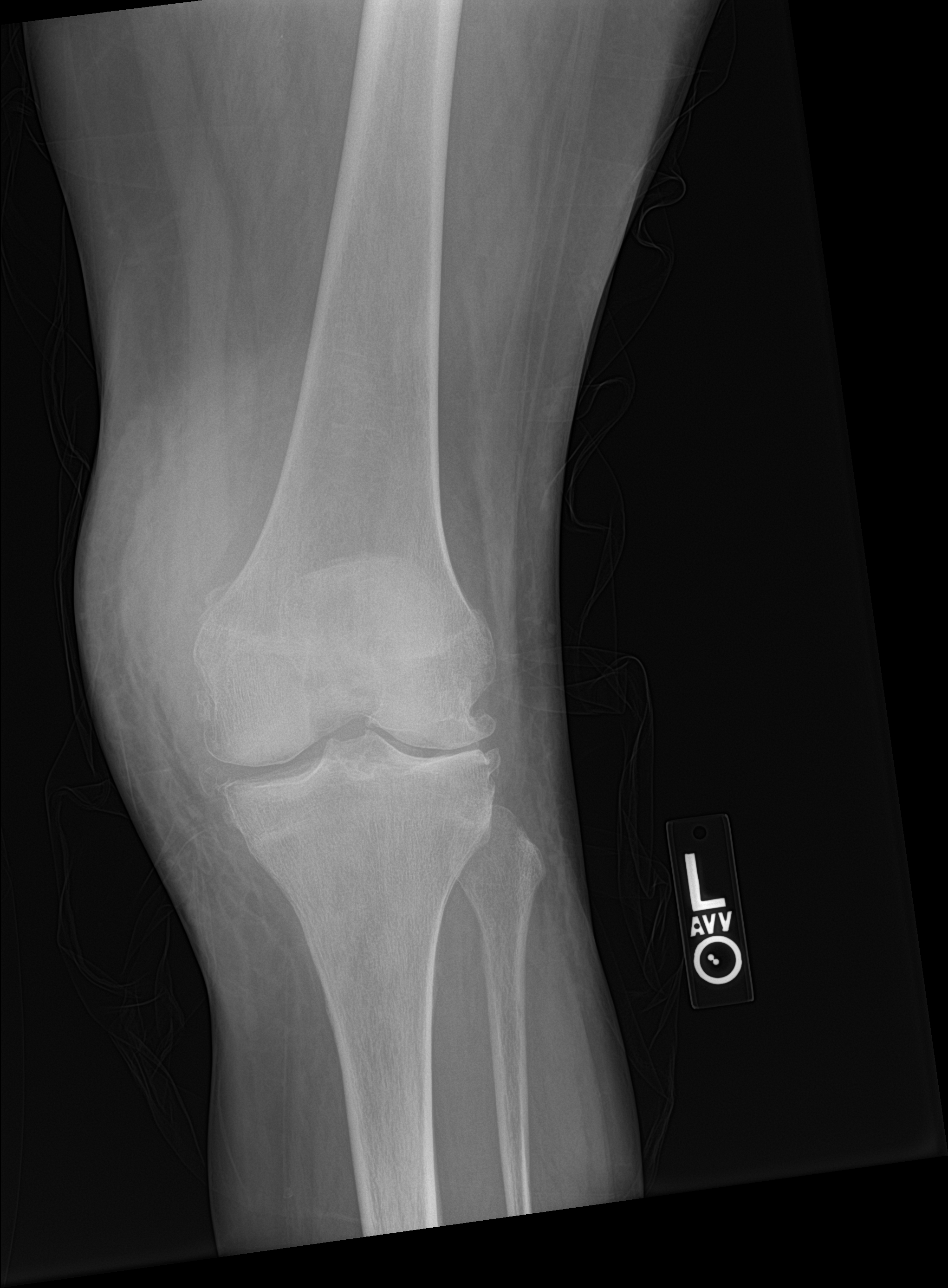
[im 3/4]
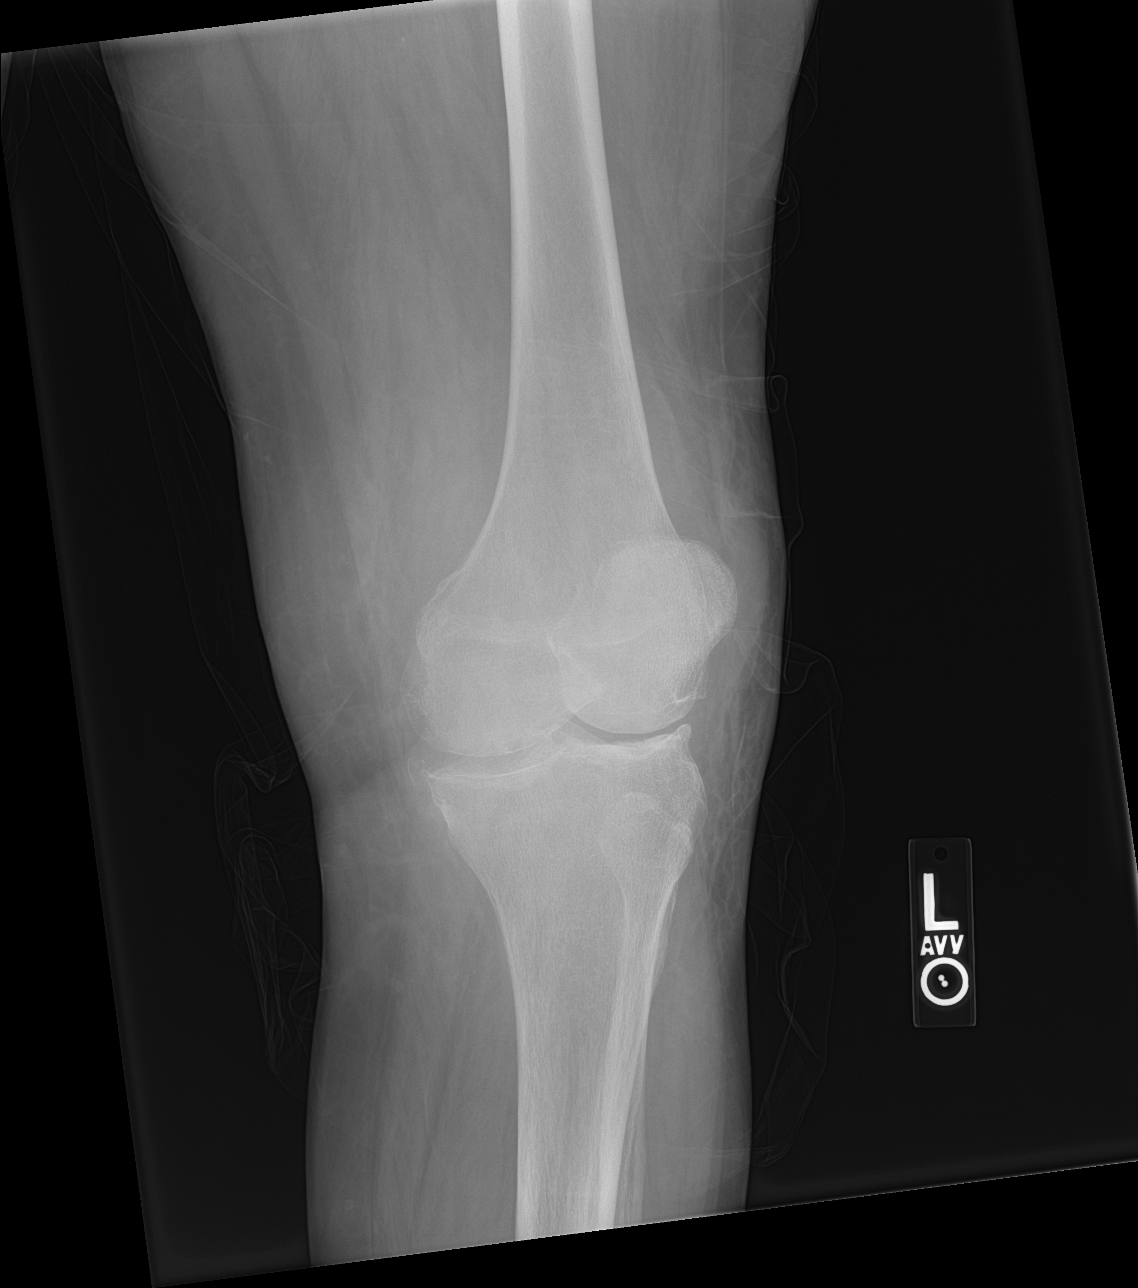
[im 4/4]
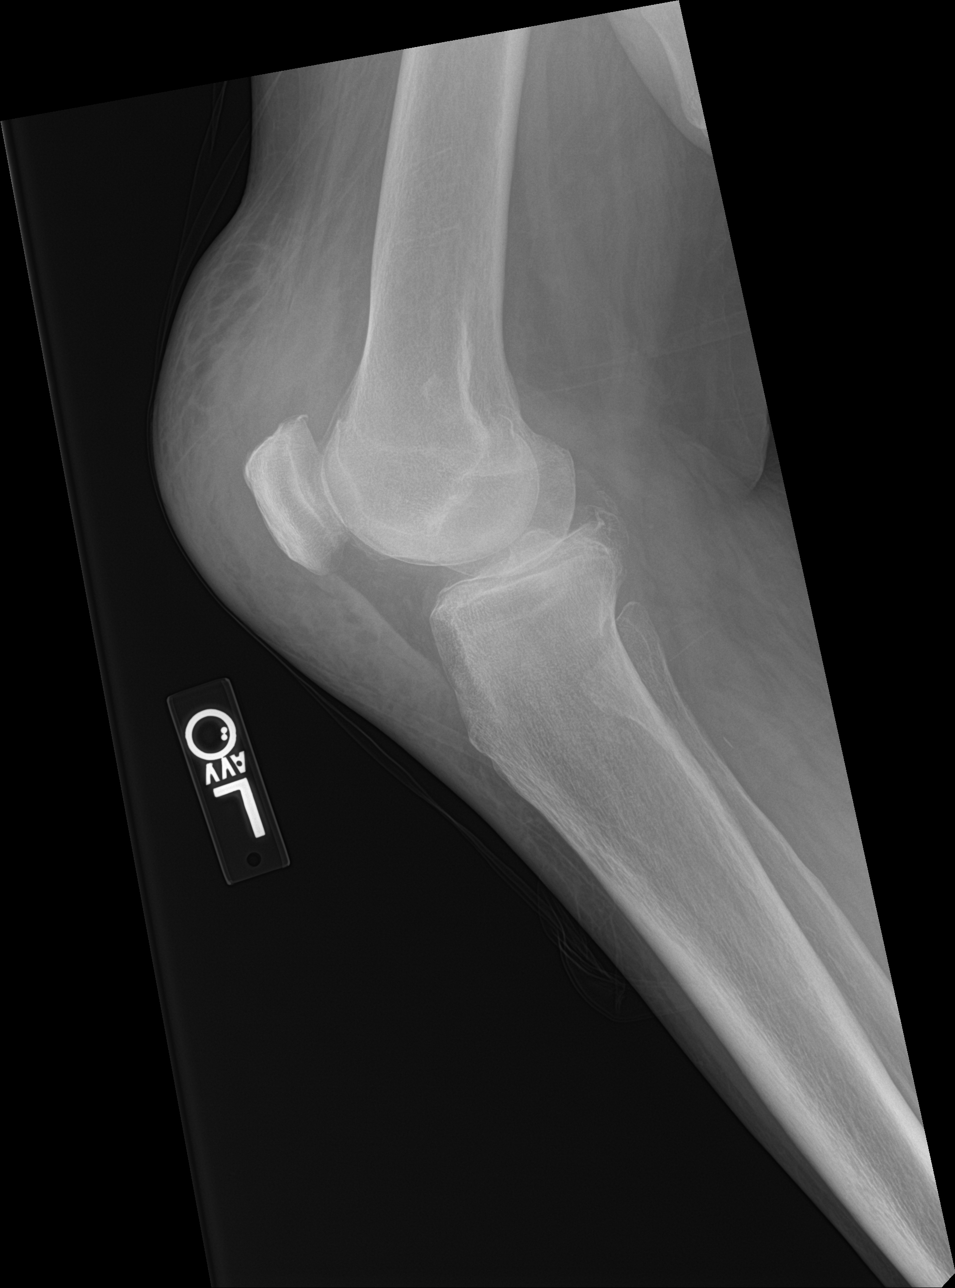

[4 of 4 positions shown; findings below may reference images not displayed]

FINDINGS: No acute fracture or dislocation. No aggressive osseous lesion.
Normal alignment. Generalized osteopenia. Moderate left lateral
femorotibial compartment joint space narrowing with marginal
osteophytes. Mild medial femorotibial compartment joint space
narrowing marginal osteophytes. Severe soft tissue swelling along
the anterior aspect of the knee.

No radiopaque foreign body or soft tissue emphysema.
IMPRESSION: 1. No acute osseous injury of the left knee.
2. Severe soft tissue swelling along the anterior aspect of the knee
consistent with soft tissue contusion.

## 2021-06-28 IMAGING — CT CT CERVICAL SPINE W/O CM
3 of 5 series · 15 of 33 positions shown, 18 images · IV contrast (APPLIED)
Comparison: None.

CLINICAL DATA: 80-year-old female with fall.



[Series 4: c-spine axial thins · axial · 0.36mm/px · z∈[-256,-138]mm · 7 of 158 slices shown, 9 images]
[im 20/158  soft-tissue]
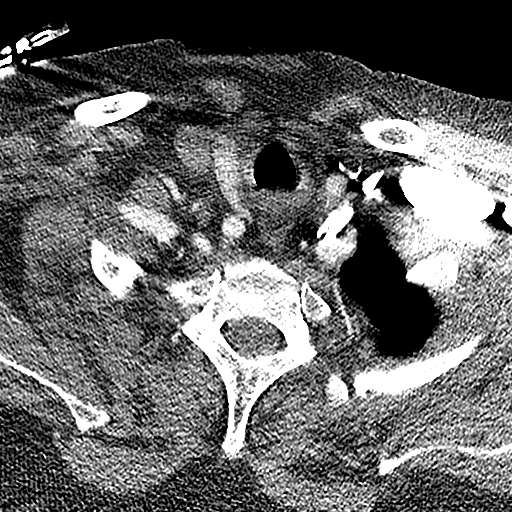
[im 20/158  bone]
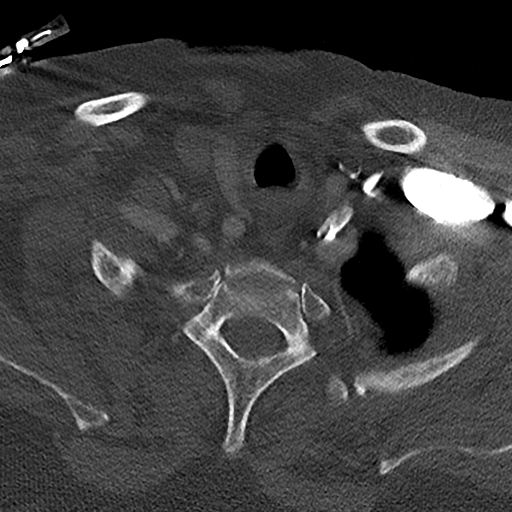
[im 40/158  bone]
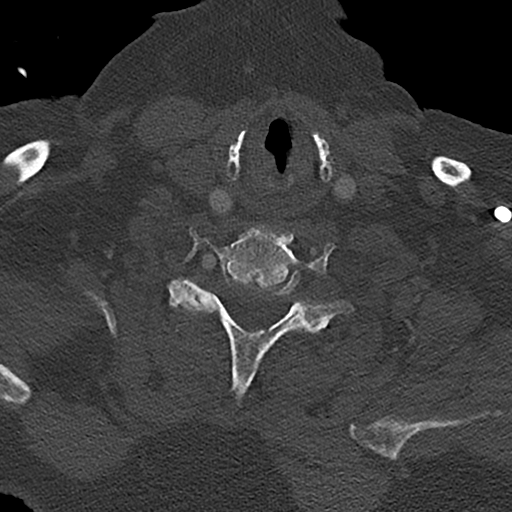
[im 59/158  bone]
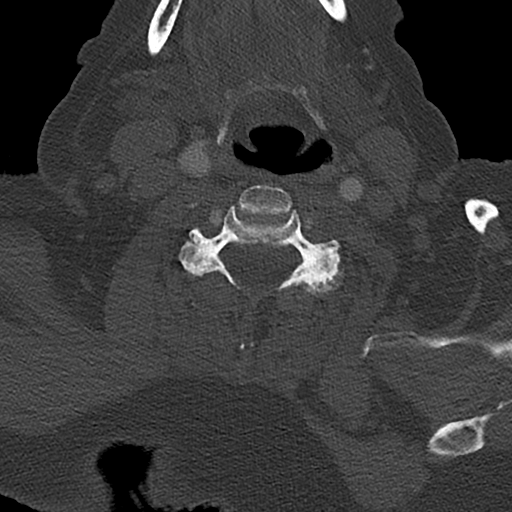
[im 79/158  bone]
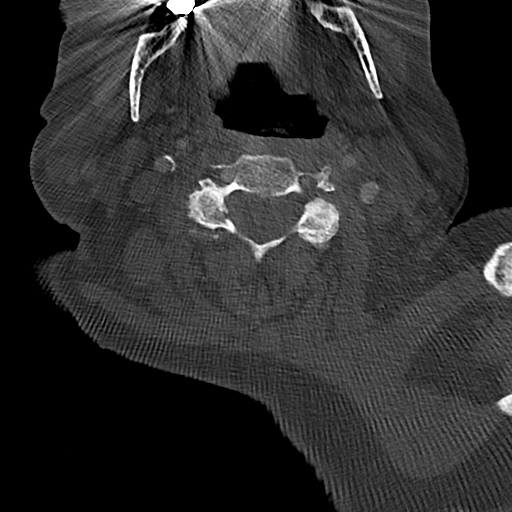
[im 99/158  soft-tissue]
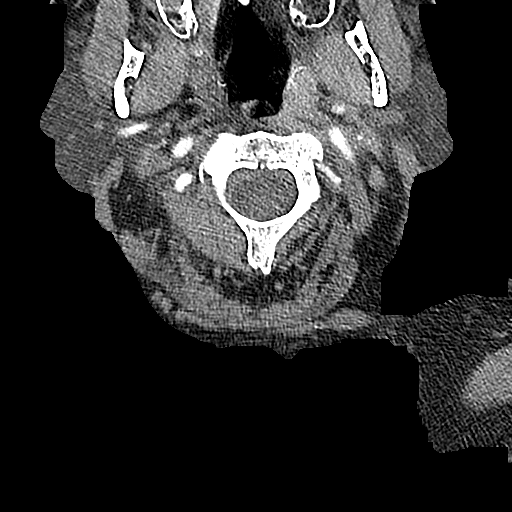
[im 99/158  bone]
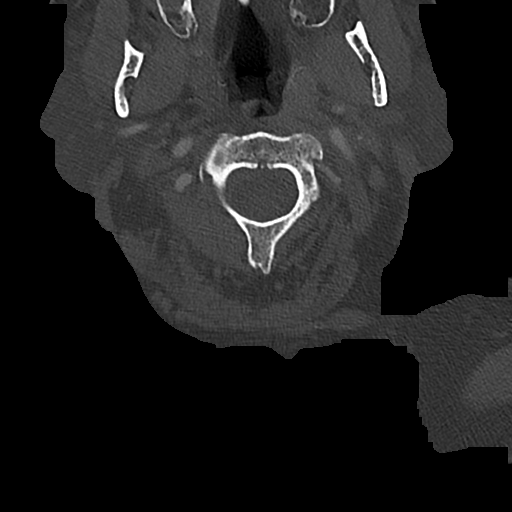
[im 118/158  bone]
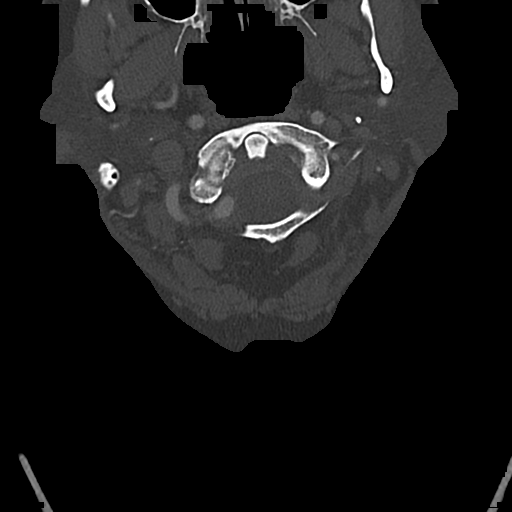
[im 138/158  bone]
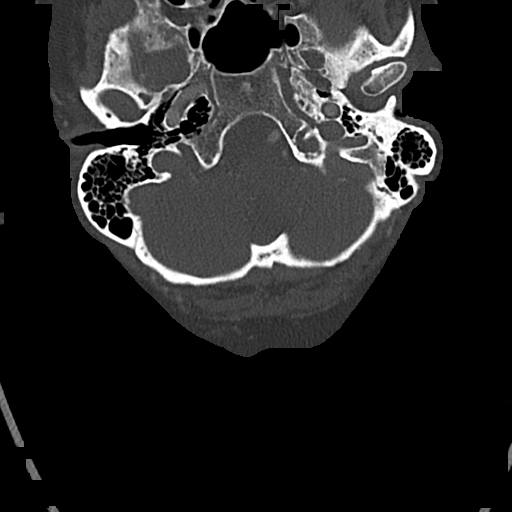

[Series 5: cor thins · coronal · 0.32mm/px · 3 of 61 slices shown]
[im 2/61  bone]
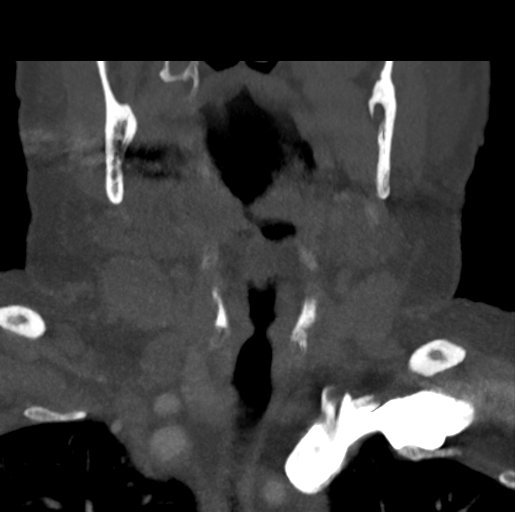
[im 31/61  bone]
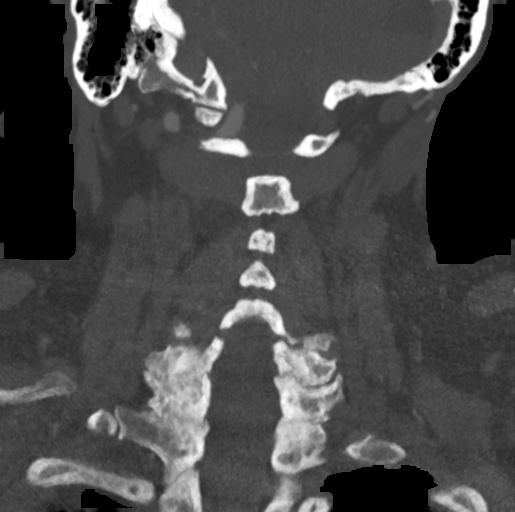
[im 60/61  bone]
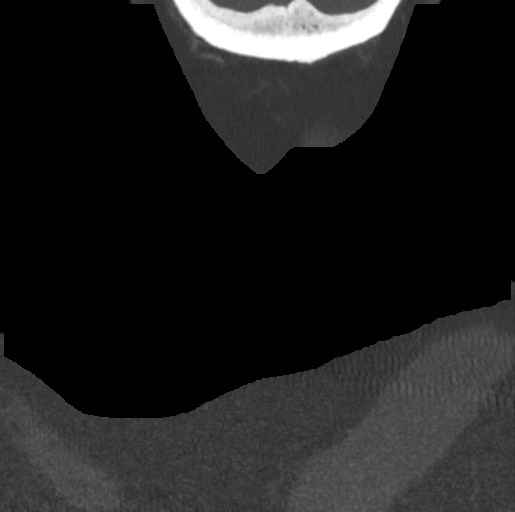

[Series 7: sag · sagittal · 0.29mm/px · 5 of 95 slices shown, 6 images]
[im 32/95  bone]
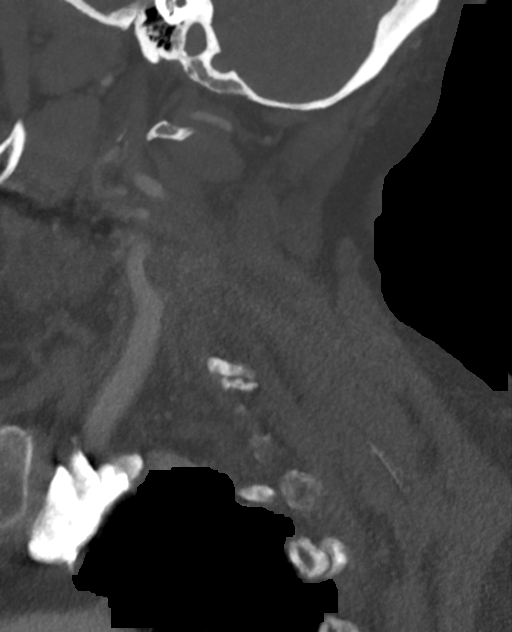
[im 40/95  bone]
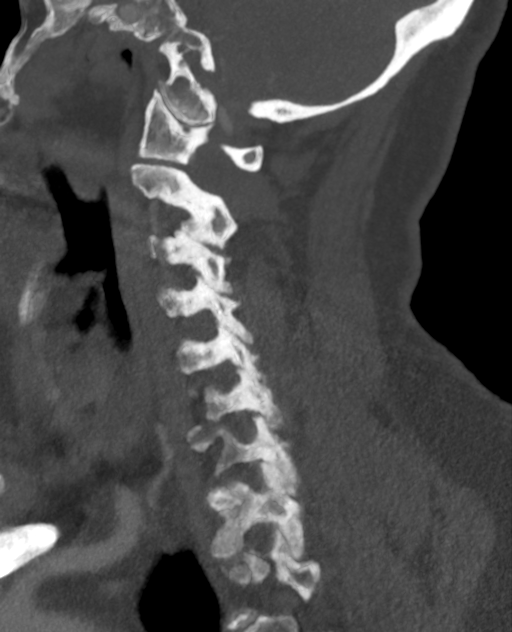
[im 48/95  soft-tissue]
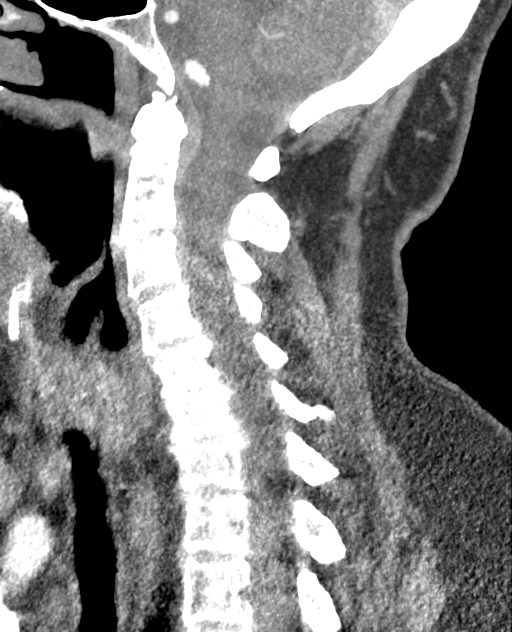
[im 48/95  bone]
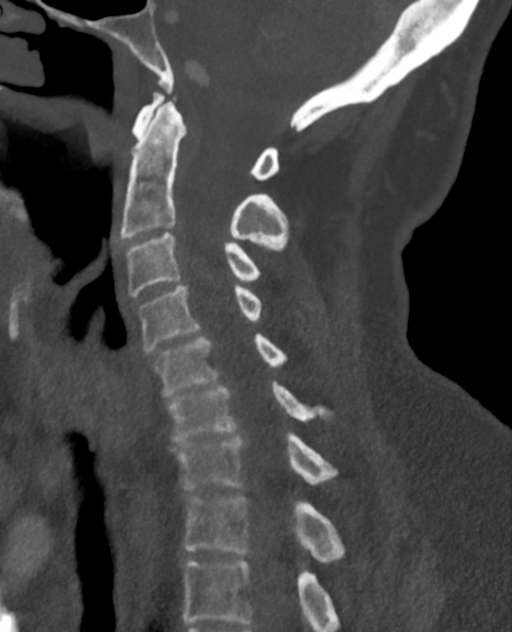
[im 55/95  bone]
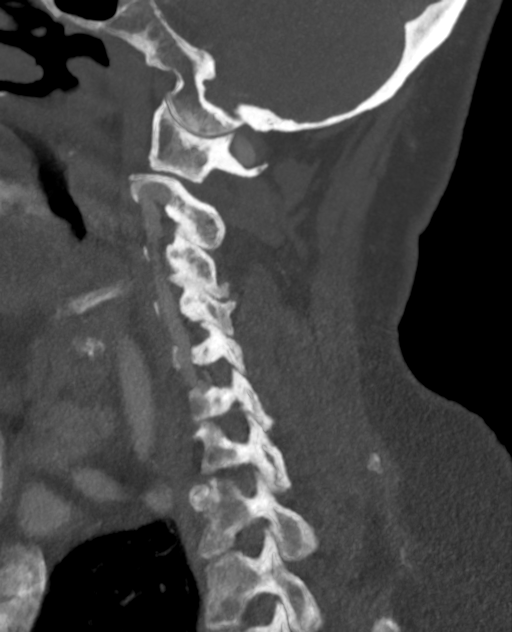
[im 63/95  bone]
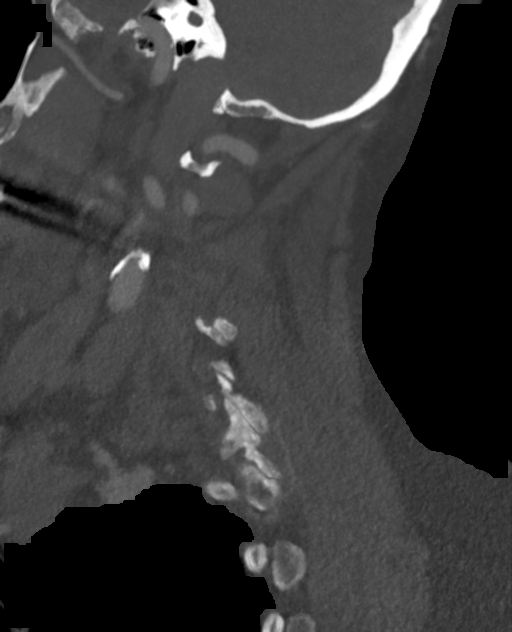

[15 of 33 positions shown; findings below may reference images not displayed]

FINDINGS: Alignment: Normal.

Skull base and vertebrae: No acute fracture. No primary bone lesion
or focal pathologic process.

Soft tissues and spinal canal: No prevertebral fluid or swelling. No
visible canal hematoma.

Disc levels: Mild multilevel degenerative disc disease/spondylosis
and mild to moderate multilevel facet arthropathy noted. These
findings contribute to mild bony RIGHT foraminal narrowing at C3-4.

Upper chest: No acute abnormality

Other: None
IMPRESSION: 1. No static evidence of acute injury to the cervical spine.
2. Mild multilevel degenerative disc disease/spondylosis and mild to
moderate multilevel facet arthropathy.

CTA head/neck is dictated in a separate report.

## 2021-06-28 IMAGING — MR MR MRA NECK W/O CM
2 series · 16 of 48 positions shown · non-contrast
Comparison: Noncontrast head CT and CT angiogram head/neck
performed earlier today [DATE].

CLINICAL DATA: Mental status change, unknown cause.

EXAM:
MRI HEAD WITHOUT CONTRAST
MRA HEAD WITHOUT CONTRAST
MRA NECK WITHOUT CONTRAST
TECHNIQUE: Multiplanar, multi-echo pulse sequences of the brain and surrounding
structures were acquired without intravenous contrast. Angiographic
images of the Circle of Willis were acquired using MRA technique
without intravenous contrast. Angiographic images of the neck were
acquired using MRA technique without intravenous contrast. Carotid
stenosis measurements (when applicable) are obtained utilizing
NASCET criteria, using the distal internal carotid diameter as the
denominator.

[Series 4: TOF · axial · 2.4mm · 0.47mm/px · z∈[-244,-81]mm · 11 of 152 slices shown (1 of 2)]
[im 8/152]
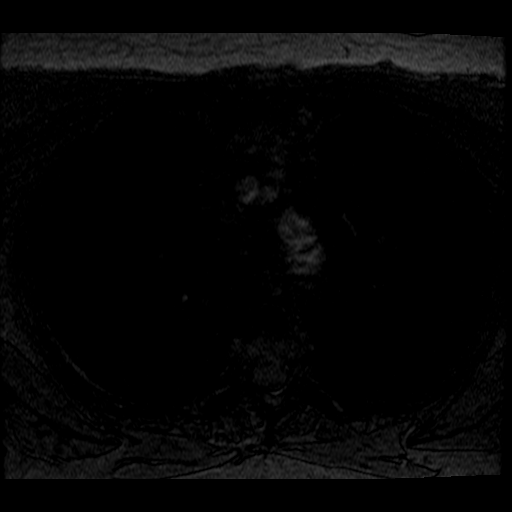
[im 26/152]
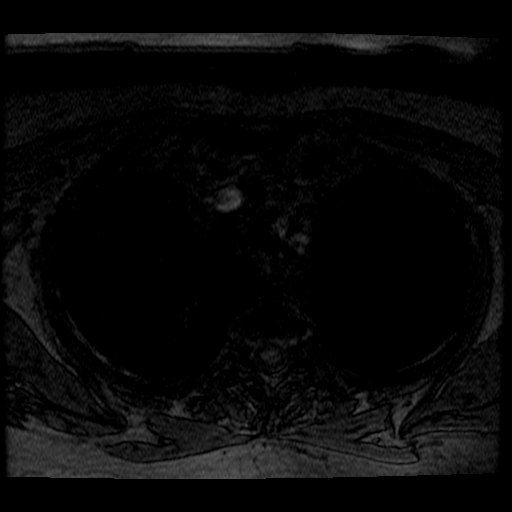
[im 29/152]
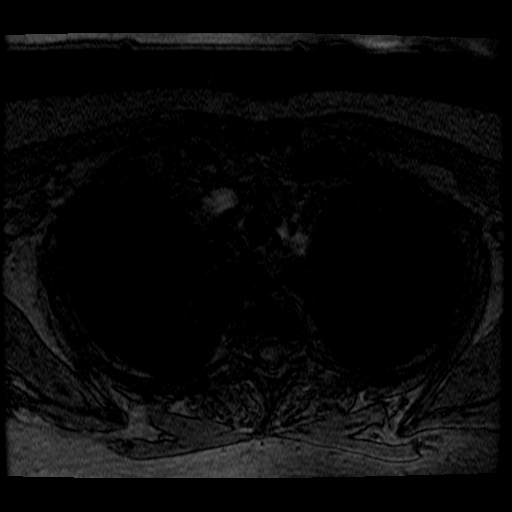
[im 47/152]
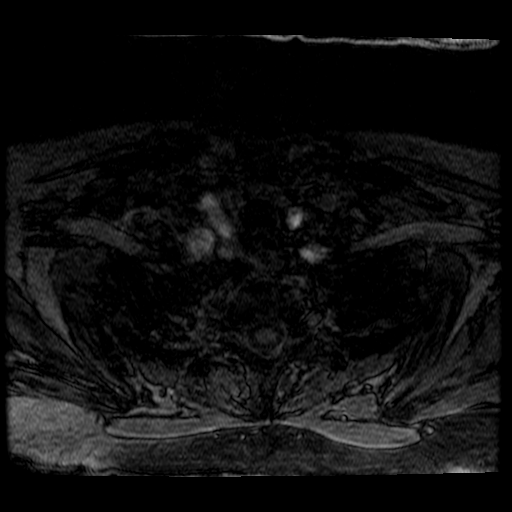
[im 65/152]
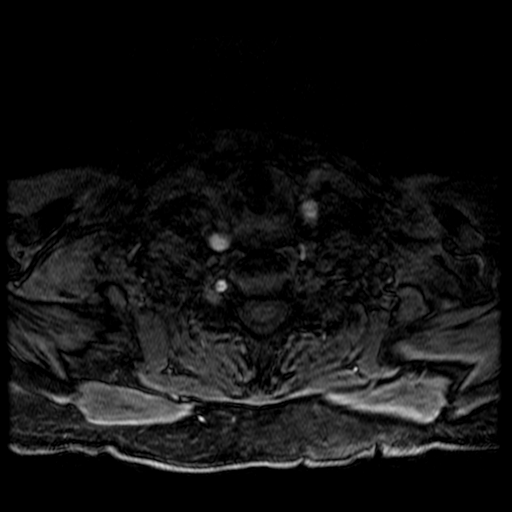
[im 76/152]
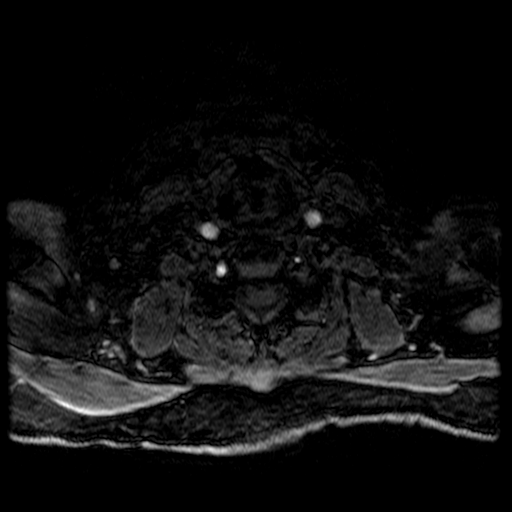
[im 87/152]
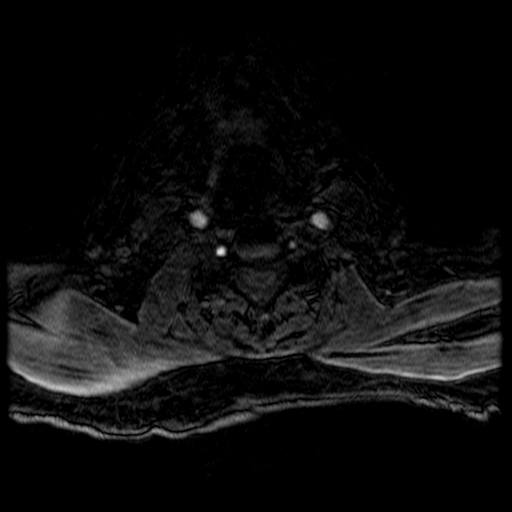
[im 105/152]
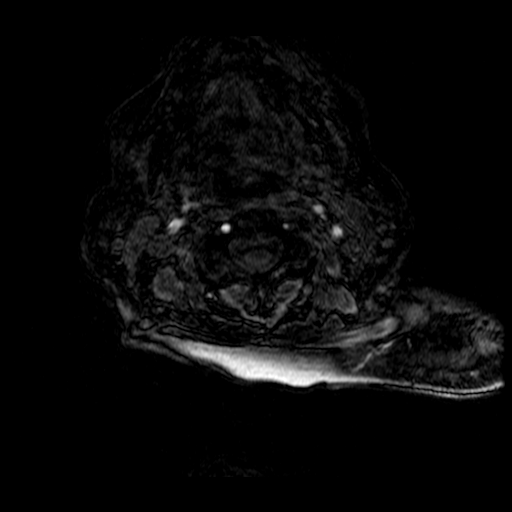
[im 123/152]
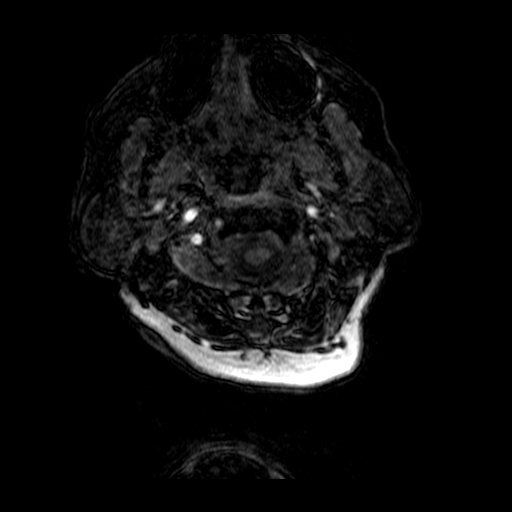
[im 126/152]
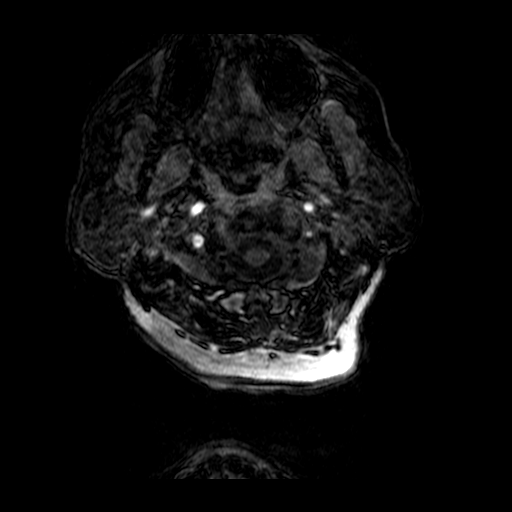
[im 144/152]
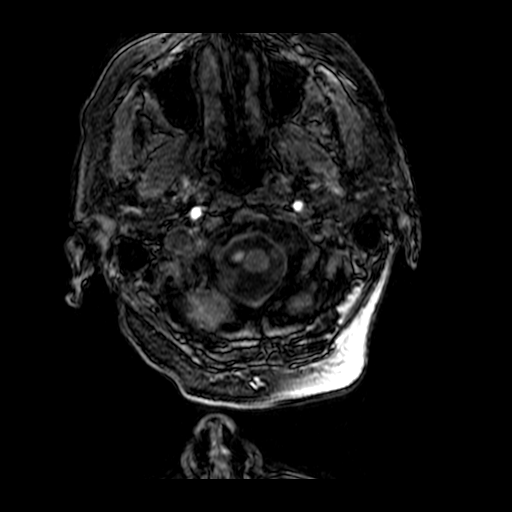

[Series 401: TOF · sagittal · 2.4mm · 0.47mm/px · 5 of 19 slices shown (2 of 2)]
[im 1/19]
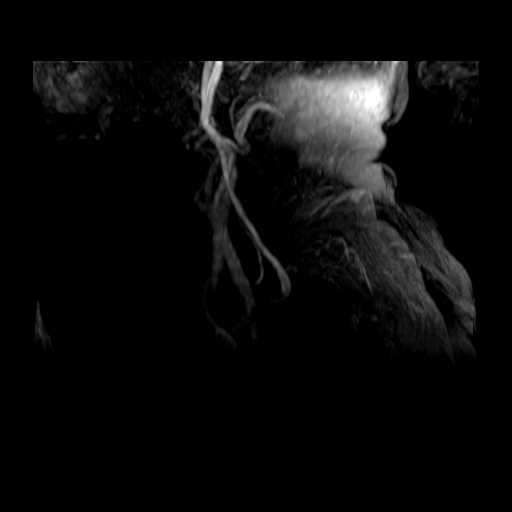
[im 5/19]
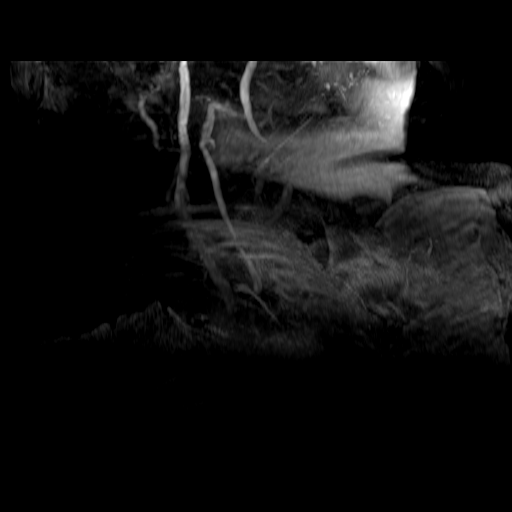
[im 10/19]
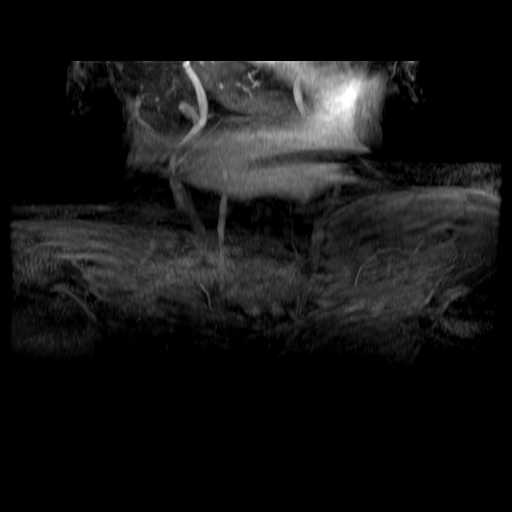
[im 14/19]
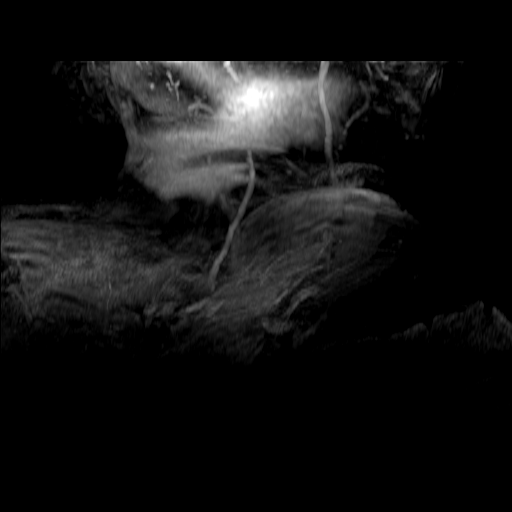
[im 19/19]
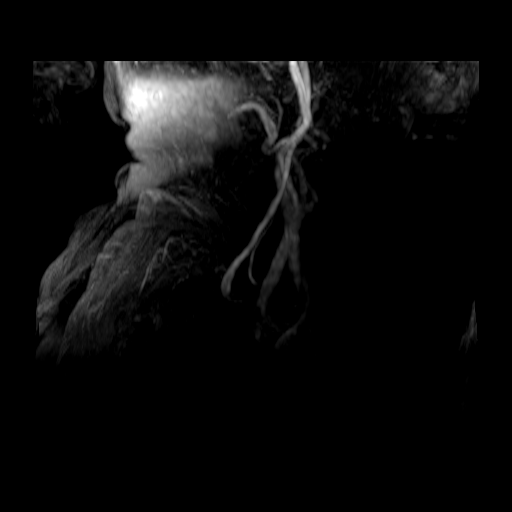

[16 of 48 positions shown; findings below may reference images not displayed]

FINDINGS: MRI HEAD FINDINGS

Brain:

Mild intermittent motion degradation.

Mild generalized cerebral and cerebellar atrophy.

There are fairly numerous scattered subcentimeter acute infarcts
within the bilateral cerebral hemispheres, right basal ganglia,
posterior limb of right internal capsule, left thalamus and within
the bilateral cerebellar hemispheres. Involvement of multiple
vascular territories raises suspicion for an embolic process.

Background minimal multifocal T2 FLAIR hyperintense signal
abnormality within the cerebral white matter, nonspecific but
compatible with chronic small vessel ischemic disease.

Chronic lacunar infarcts within the right (and possibly left)
caudate nuclei.

There are a few nonspecific supratentorial chronic parenchymal
microhemorrhages.

No evidence of an intracranial mass.

No extra-axial fluid collection.

No midline shift.

Vascular: Maintained flow voids within the proximal large arterial
vessels.

Skull and upper cervical spine: No focal suspicious marrow lesion.

Sinuses/Orbits: Visualized orbits show no acute finding. Bilateral
ocular lens replacements. Mild mucosal thickening within the left
frontal sinus and within a posterior left ethmoid air cell.

MRA HEAD FINDINGS

Mildly motion degraded exam.

Anterior circulation:

The intracranial internal carotid arteries are patent. Minimal
atherosclerotic irregularity of both vessels. The M1 middle cerebral
arteries are patent. No M2 proximal branch occlusion or high-grade
proximal stenosis is identified. Mild atherosclerotic irregularity
of the M2 and more distal MCA vessels, bilaterally. The anterior
cerebral arteries are patent. No intracranial aneurysm is
identified.

Posterior circulation:

The intracranial vertebral arteries are patent. The non dominant
left vertebral artery is developmentally diminutive beyond the
origin of the left PICA. The basilar artery is patent. The posterior
cerebral arteries are patent. Posterior communicating arteries are
present bilaterally.

Anatomic variants: None significant.

MRA NECK FINDINGS

Aortic arch: Motion degradation and noncontrast technique limits
evaluation of the aortic arch and origins of the great vessels of
the neck.

Right carotid system: Within described limitations, the CCA and ICA
are patent within the neck without hemodynamically significant
stenosis (50% or greater). Mild atherosclerotic plaque within the
proximal ICA.

Left carotid system: Within described limitations, the CCA and ICA
are patent within the neck without hemodynamically significant
stenosis. Mild atherosclerotic plaque within the carotid bifurcation
and proximal ICA.

Vertebral arteries: Within described limitations, the vertebral
arteries are patent within the neck without hemodynamically
significant stenosis. The right vertebral artery is dominant.
IMPRESSION: MRI brain:

1. Mildly motion degraded exam.
2. Fairly numerous subcentimeter acute infarcts within the bilateral
cerebral hemispheres, right basal ganglia, posterior limb of right
internal capsule, left thalamus and within the bilateral cerebellar
hemispheres. Involvement of multiple vascular territories raises
suspicion for an embolic process.
3. Background minimal chronic small-vessel ischemic changes within
the cerebral white matter.
4. Chronic lacunar infarcts within the right (and possibly left)
caudate nuclei.
5. Mild generalized parenchymal atrophy.
6. Mild paranasal sinus disease, as described.

MRA head:

1. No intracranial large vessel occlusion or proximal high-grade
arterial stenosis.
2. Intracranial atherosclerotic disease, as described.

MRA neck:

1. Motion degradation and non-contrast technique limits evaluation
of the aortic arch and proximal major branch vessels of the neck.
2. Within this limitation, the common carotid, internal carotid and
vertebral arteries are patent within neck without hemodynamically
significant stenosis. Mild atherosclerotic plaque within the
bilateral carotid systems within the neck, as described.

## 2021-06-28 IMAGING — DX DG CHEST 1V PORT
1 series · 1 of 1 positions shown · non-contrast
Comparison: None.

CLINICAL DATA: Fall

EXAM:
PORTABLE CHEST 1 VIEW

[chest]
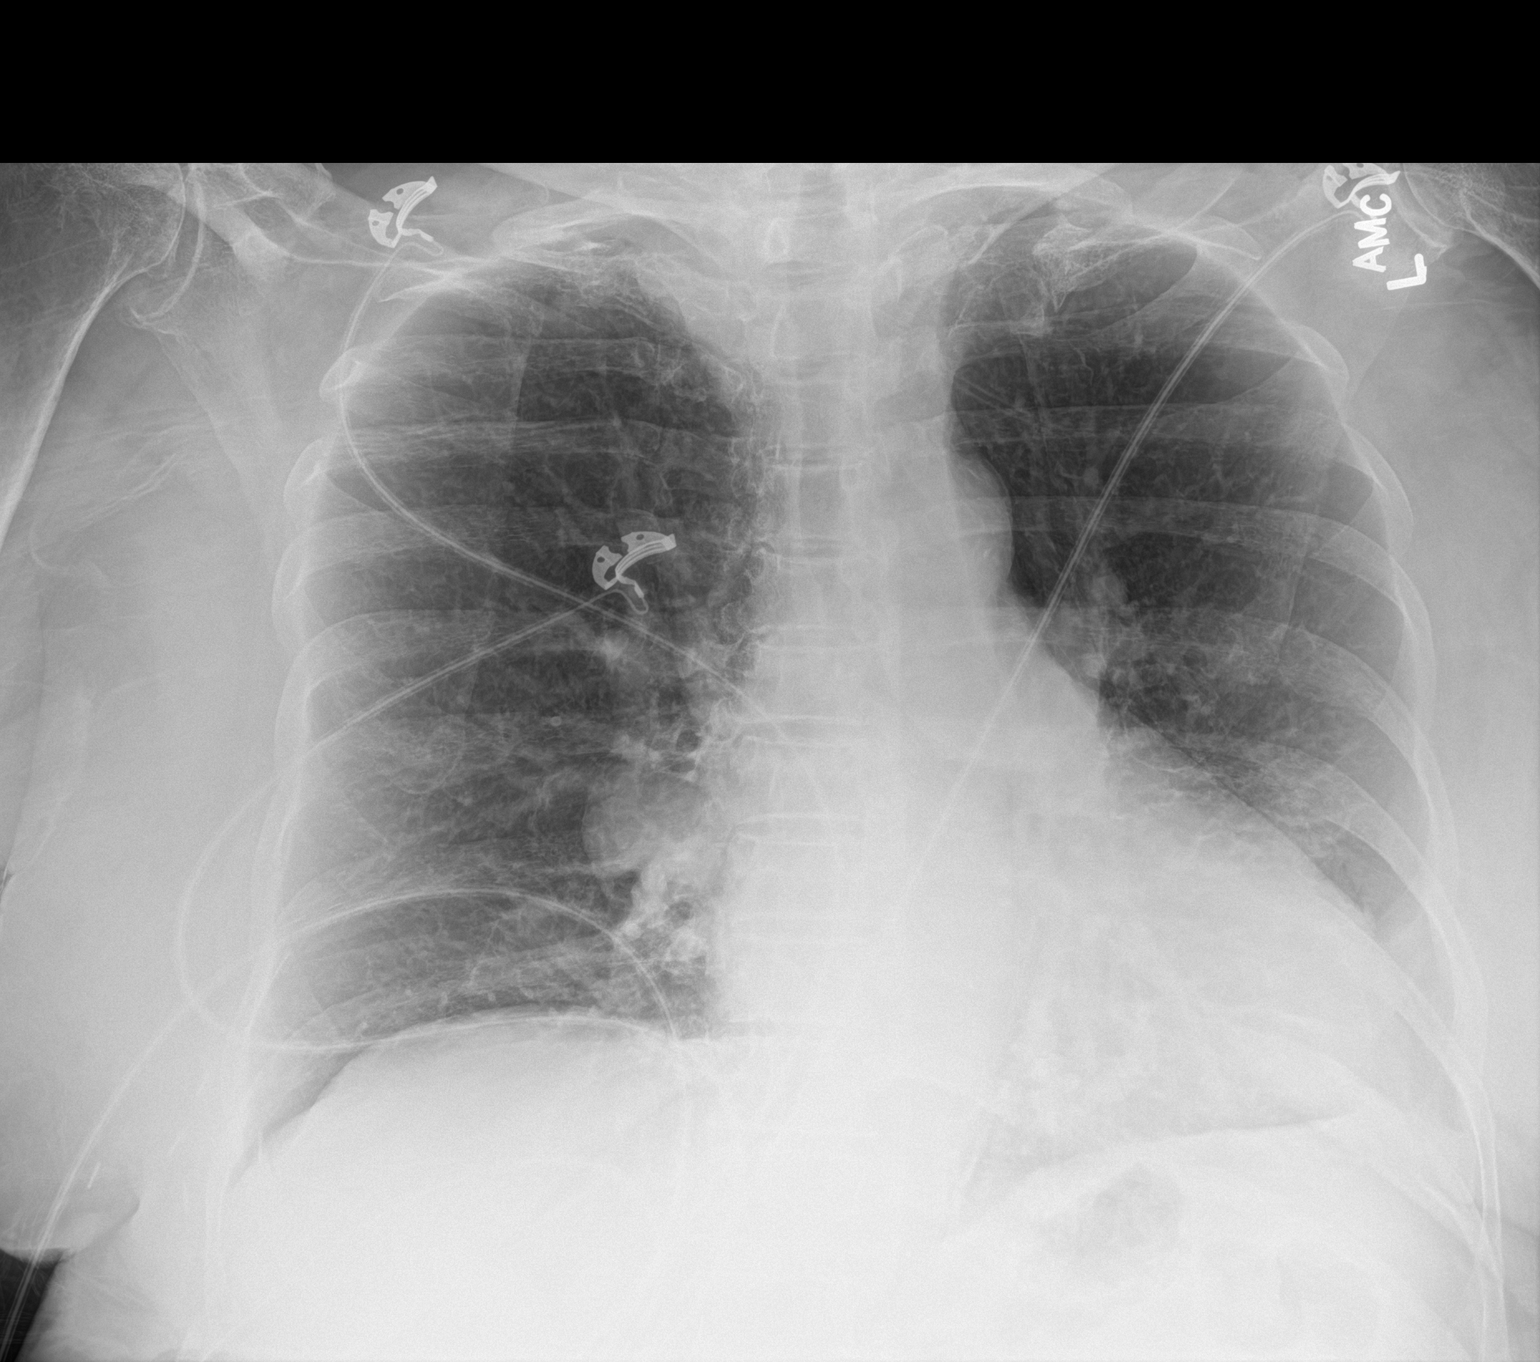

[1 of 1 positions shown; findings below may reference images not displayed]

FINDINGS: Lungs are clear. No pneumothorax or pleural effusion. Cardiac size
is mildly enlarged. Prominent calcification of the mitral valve
annulus noted. Rounded opacity within the right perihilar region
represents vascular shadow, better assessed on prior CT examination
of [DATE]. Pulmonary vascularity is otherwise normal. No acute
bone abnormality.
IMPRESSION: No radiographic evidence of acute cardiopulmonary disease.

Mild cardiomegaly

## 2021-06-28 MED ORDER — IOHEXOL 350 MG/ML SOLN
75.0000 mL | Freq: Once | INTRAVENOUS | Status: AC | PRN
  Administered 2021-06-28: 75 mL via INTRAVENOUS

## 2021-06-28 MED ORDER — RIVAROXABAN 20 MG PO TABS: 20.0000 mg | ORAL_TABLET | Freq: Every day | ORAL | Status: DC

## 2021-06-28 NOTE — Assessment & Plan Note (Signed)
Supportive measures, monitor while on xarelto Monitor for any sign of infection

## 2021-06-28 NOTE — Subjective & Objective (Signed)
Patient woke up at 3 AM and was walking around her bed when she fell down hitting her knee and elbow.  She could not get up try to get herself to phone and called EMS. It was noted the patient has left arm and leg weakness.  Which is likely what resulted in her fall.  Fall has resulted in severe pain if her knee. She is chronically anticoagulated with Xarelto

## 2021-06-28 NOTE — Assessment & Plan Note (Signed)
Not on any beta-blocker currently rate controlled continue Xarelto Given embolic CVA obtain echogram

## 2021-06-28 NOTE — Consult Note (Addendum)
Neurology Consult H&P  Laura Mendez MR# [id number] 06/28/2021   CC: left arm and leg weakness  History is obtained from: Patient, son and chart.  HPI: Laura Mendez is a 81 y.o. female PMHx as reviewed below , PAF (rivaroxaban) woke at 0300 and started to walk around the foot of her bed and fell hitting her knee and elbow. She could not get up and dragged herself to the phone and EMS transported her to ED. She has never had this before and has not missed dose of rivaroxaban  LKW: 2100 yesterday tNK given: No OSW IR Thrombectomy Not candidate Modified Rankin Scale: 0-Completely asymptomatic and back to baseline post- stroke NIHSS: 3  LOC Responsiveness 0 LOC Questions 0 LOC Commands 0 Horizontal eye movement 0 Visual field 0 Facial palsy 1 Motor arm - Right arm 0 Motor arm - Left arm 1 Motor leg - Right leg 0 Motor leg - Left leg 1 Limb ataxia 0 Sensory test 0 Language 0 Speech 0 Extinction and inattention 0  ROS: A complete ROS was performed and is negative except as noted in the HPI.   Past Medical History:  Diagnosis Date   Asthma    Chronic airway obstruction, not elsewhere classified    Diastolic dysfunction without heart failure    Disorder of bone and cartilage, unspecified    Dysrhythmia    A-Fib   Family history of colon cancer    Family history of kidney cancer    Family history of ovarian cancer    Paroxysmal atrial flutter (HCC)    Stroke ([place])    TIA   TIA (transient ischemic attack)    Unspecified asthma(493.90)      Family History  Problem Relation Age of Onset   COPD Father    Colon cancer Mother 48       mets to pancreas and liver   Kidney cancer Sister 84       d. 38   Lung cancer Sister        d. 92, smoker   Ovarian cancer Sister 49       d. 73   Cirrhosis Sister        d. 48   Melanoma Sister 61   Ovarian cancer Maternal Grandmother        d. 9   Heart disease Maternal Grandfather        rheumatic heart  disease   Rectal cancer Paternal Grandfather        d. 19   Parkinson's disease Brother    Ovarian cancer Maternal Aunt    Colon cancer Maternal Uncle        d. 72-73   Heart attack Paternal Uncle        d. 32   Kidney cancer Brother 23   Colon cancer Maternal Uncle        d. 41   Cancer Other        MGMs pat 1/2 sister with uterine and rectal cancer   Colon cancer Cousin        d. 81 - mat first cousin   Breast cancer Neg Hx     Social History:  reports that she quit smoking about 44 years ago. Her smoking use included cigarettes. She has never used smokeless tobacco. She reports that she does not drink alcohol and does not use drugs.   Prior to Admission medications   Medication Sig Start Date End Date Taking? Authorizing Provider  albuterol (VENTOLIN HFA)  108 (90 Base) MCG/ACT inhaler INHALE 2 PUFFS INTO LUNGS EVERY 4 HOURS AS NEEDED 12/24/20   Baird Lyons D, MD  cholecalciferol (VITAMIN D) 1000 units tablet Take 1,000 Units by mouth daily.    [provider]  clobetasol cream (TEMOVATE) 8.09 % Apply 1 application topically as needed.    [provider]  fish oil-omega-3 fatty acids 1000 MG capsule Take 1 g by mouth daily.    [provider]  Fluticasone-Salmeterol (WIXELA INHUB) 250-50 MCG/DOSE AEPB Inhale 1 puff into the lungs 2 (two) times daily. 07/24/20   Baird Lyons D, MD  ipratropium-albuterol (DUONEB) 0.5-2.5 (3) MG/3ML SOLN Take 3 mLs by nebulization every 6 (six) hours as needed. 05/30/20   Baird Lyons D, MD  letrozole [facility]) 2.5 MG tablet Take 1 tablet by mouth once daily 08/21/20   Nicholas Lose, MD  MEGARED OMEGA-3 KRILL OIL 500 MG CAPS Take 500 mg by mouth daily.     [provider]  molnupiravir EUA (LAGEVRIO) 200 mg CAPS capsule Take 4 capsules (800 mg total) by mouth 2 (two) times daily for 5 days. 06/26/21 07/01/21  Barrett Henle, MD  Multiple Vitamin (MULTIVITAMIN) tablet Take 1 tablet by mouth daily.    [provider]  predniSONE (STERAPRED UNI-PAK 21 TAB) 10 MG (21) TBPK tablet Take by mouth daily. Take 6 tabs by mouth daily  for 2 days, then 5 tabs for 2 days, then 4 tabs for 2 days, then 3 tabs for 2 days, 2 tabs for 2 days, then 1 tab by mouth daily for 2 days 04/13/21   Hans Eden, NP  rivaroxaban (XARELTO) 20 MG TABS tablet TAKE 1 TABLET BY MOUTH ONCE DAILY WITH SUPPER 12/23/20   Jerline Pain, MD  triamcinolone ointment (KENALOG) 0.1 % Apply 1 application topically 2 (two) times daily as needed (for itching).    [provider]    Exam: Current vital signs: BP (!) 108/91    Pulse 67    Temp 98.7 F (37.1 C) (Oral)    Resp (!) 22    Ht 5\' 11"  (1.803 m)    Wt 88.5 kg    SpO2 94%    BMI 27.20 kg/m   Physical Exam  Constitutional: Appears well-developed and well-nourished.  Psych: Affect appropriate to situation Eyes: No scleral injection HENT: No OP obstruction. Head: Normocephalic.  Cardiovascular: Normal rate and regular rhythm.  Respiratory: Effort normal, symmetric excursions bilaterally, no audible wheezing. GI: Soft.  No distension. There is no tenderness.  Skin: WDI  Neuro: Mental Status: Patient is awake, alert, oriented to person, place, month, year, and situation. Patient is able to give a clear and coherent history. Speech fluent, intact comprehension and repetition. No signs of aphasia or neglect. Visual Fields are full. Pupils are equal, round, and reactive to light. EOMI without ptosis or diplopia.  Facial sensation is symmetric to temperature Facial movement is weak lower face however smile is symmetric.  Hearing is intact to voice. Uvula midline and palate elevates symmetrically. Shoulder shrug is symmetric. Tongue is midline without atrophy or fasciculations.  Tone is normal. Bulk is normal. Left upper extremity ~4+/5 proximal, 4/5 forearm, 2+/5 hand. LLE 4/5 hip flexor and extensor. Sensation is symmetric to light touch and temperature in  the arms and legs. Deep Tendon Reflexes: 2+ and symmetric in the biceps and patellae. Toes mute on left down going right.  FNF and HKS are intact bilaterally. Gait - Deferred  I have reviewed  labs in epic and the pertinent results are: No labs available at time of evaluation  I have reviewed the images obtained: NCT head showed no evidence of acute intracranial abnormality. 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 CTA head and neck showed no intracranial large vessel occlusion or proximal high-grade arterial stenosis identified. Nonstenotic atherosclerotic plaque within the intracranial ICAs. Atherosclerotic irregularity of the M2 and more distal MCA vessels, bilaterally.  Assessment: Laura Mendez is a 81 y.o. female PMHx as noted above PAF (rivaroxaban no missed doses) woke this morning, immediately falling and not able to get up. Based on her exam I would say say she has had some small multifocal areas of stroke which is supported by her history of PAF and will need further admission.  Plan: - MRI brain without contrast. - Recommend labs: HbA1c, lipid panel, TSH. - Recommend Statin if LDL > 70 - Continue rivaroxaban - SBP goal <180. - Telemetry monitoring for arrhythmia. - Recommend bedside Swallow screen. - Recommend Stroke education. - Recommend PT/OT/SLP consult.  This patient is critically ill and at significant risk of neurological worsening, death and care requires constant monitoring of vital signs, hemodynamics,respiratory and cardiac monitoring, neurological assessment, discussion with family, other specialists and medical decision making of high complexity. I spent 73 minutes of neurocritical care time  in the care of  this patient. This was time spent independent of any time provided by nurse practitioner or PA.  Electronically signed by:  Lynnae Sandhoff, MD Page: [phone] 06/28/2021, 5:04 PM  If 7pm- 7am, please page neurology on call as listed  in [place].

## 2021-06-28 NOTE — H&P (Signed)
Laura Mendez VOZ:[id number] DOB: 1941/04/18 DOA: 06/28/2021     PCP: Hoyt Koch, MD   Outpatient Specialists:   CARDS: Dr. Marlou Porch  Oncology : Dr. Lindi Adie Pulmonary    Dr.Young     Patient arrived to ER on 06/28/21 at 1112 Referred by Attending Toy Baker, MD   Patient coming from: home Lives alone,     Chief Complaint:   Chief Complaint  Patient presents with   Stroke Symptoms    Decreased sensation to L arm, slurred speech   Fall    W/ skin tear to L arm    HPI: Laura Mendez is a 81 y.o. female with medical history significant of atrial fibrillation on Xarelto, COPD, remote history of breast cancer,    Presented with fall secondary to left-sided weakness also associated speech difficulty Patient woke up at 3 AM and was walking around her bed when she fell down hitting her knee and elbow.  She could not get up try to get herself to phone and called EMS. It was noted the patient has left arm and leg weakness.  Which is likely what resulted in her fall.  Fall has resulted in severe pain if her knee. She is chronically anticoagulated with Xarelto  Patient hit her left knee with a lot of swelling and pain.  Final son finally arrived around 6 AM and noted that patient at that time had slurred speech vital signs stable per EMS Quit tobacco in 1979  She was seen in ER for chest discomfort The was prescribed  Largvrio just in case she needs it but Covid was negative  Never missed a dose of xarelto Has   been vaccinated against COVID     Initial COVID TEST  NEGATIVE   Lab Results  Component Value Date   [place] 06/28/2021     Regarding pertinent Chronic problems:    History of breast cancer on Femara      chronic CHF diastolic/ - last echo      COPD -  followed by pulmonology  not  on baseline oxygen *L,       A. 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 Involvement of multiple vascular territories raises suspicion for an embolic process.  MRA head and neck no hemodynamic stenosis CXR -  cardiomegaly   Left knee CT - no hemearthrosis  Following Medications were ordered in ER: Medications  iohexol (OMNIPAQUE) 350 MG/ML injection 75 mL (75 mLs Intravenous Contrast Given 06/28/21 1308)    _______________________________________________________ ER Provider Called: Neurology   Dr. Theda Sers They Recommend admit to medicine    SEEN in ER   ED Triage Vitals  Enc Vitals Group     BP 06/28/21 1140 136/64     Pulse Rate 06/28/21 1140 83     Resp 06/28/21 1140 (!) 22     Temp 06/28/21 1140 98.7 F (37.1 C)     Temp Source 06/28/21 1140 Oral  SpO2 06/28/21 1117 95 %     Weight 06/28/21 1129 195 lb (88.5 kg)     Height 06/28/21 1129 5\' 11"  (1.803 m)     Head Circumference --      Peak Flow --      Pain Score 06/28/21 1128 8     Pain Loc --      Pain Edu? --      Excl. in [place]? --   TMAX(24)@     _________________________________________ Significant initial  Findings: Abnormal Labs Reviewed  PROTIME-INR - Abnormal; Notable for the following components:      Result Value   Prothrombin Time 19.8 (*)    INR 1.7 (*)    All other components within normal limits  APTT - Abnormal; Notable for the following components:   aPTT 37 (*)    All other components within normal limits  COMPREHENSIVE METABOLIC PANEL - Abnormal; Notable for the following components:   Glucose, Bld 113 (*)    BUN 6 (*)    All  other components within normal limits  URINALYSIS, ROUTINE W REFLEX MICROSCOPIC - Abnormal; Notable for the following components:   Hgb urine dipstick TRACE (*)    All other components within normal limits  I-STAT CHEM 8, ED - Abnormal; Notable for the following components:   Glucose, Bld 113 (*)    All other components within normal limits       ECG: Ordered Personally reviewed by me showing: HR : 67 Rhythm: NSR,     no evidence of ischemic changes QTC 394   The recent clinical data is shown below. Vitals:   06/28/21 1630 06/28/21 1715 06/28/21 1800 06/28/21 1830  BP: 116/63 122/80 129/70 116/81  Pulse: 70 66 63 68  Resp: 20 (!) 21 20 (!) 21  Temp:      TempSrc:      SpO2: 95% 95% 96% 97%  Weight:      Height:        WBC     Component Value Date/Time   WBC 9.2 06/28/2021 1126   LYMPHSABS 1.3 06/28/2021 1126   MONOABS 0.9 06/28/2021 1126   EOSABS 0.1 06/28/2021 1126   BASOSABS 0.0 06/28/2021 1126      UA   no evidence of UTI    Urine analysis:    Component Value Date/Time   COLORURINE YELLOW 06/28/2021 [place] 06/28/2021 1533   LABSPEC 1.010 06/28/2021 1533   PHURINE 7.5 06/28/2021 1533   GLUCOSEU NEGATIVE 06/28/2021 1533   GLUCOSEU NEGATIVE 07/28/2020 1042   HGBUR TRACE (A) 06/28/2021 1533   HGBUR negative 09/18/2009 0846   BILIRUBINUR NEGATIVE 06/28/2021 1533   KETONESUR NEGATIVE 06/28/2021 1533   PROTEINUR NEGATIVE 06/28/2021 1533   UROBILINOGEN 0.2 07/28/2020 1042   NITRITE NEGATIVE 06/28/2021 1533   LEUKOCYTESUR NEGATIVE 06/28/2021 1533    Results for orders placed or performed during the hospital encounter of 06/28/21  Resp Panel by RT-PCR (Flu A&B, Covid) Nasopharyngeal Swab     Status: None   Collection Time: 06/28/21 11:55 AM   Specimen: Nasopharyngeal Swab; Nasopharyngeal(NP) swabs in vial transport medium  Result Value Ref Range Status   SARS Coronavirus 2 by RT PCR NEGATIVE NEGATIVE Final         Influenza A by PCR  NEGATIVE NEGATIVE Final   Influenza B by PCR NEGATIVE NEGATIVE Final          _______________________________________________ Hospitalist was called for admission for CVA  The following Work  up has been ordered so far:  Orders Placed This Encounter  Procedures   Critical Care   Resp Panel by RT-PCR (Flu A&B, Covid) Nasopharyngeal Swab   DG Forearm Left   DG Knee Complete 4 Views Left   CT C-SPINE NO CHARGE   CT ANGIO HEAD NECK W WO CM   MR BRAIN WO CONTRAST   MR ANGIO HEAD WO CONTRAST   MR ANGIO NECK WO CONTRAST   CT Knee Left Wo Contrast   DG Chest Portable 1 View   Ethanol   Protime-INR   APTT   Comprehensive metabolic panel   Urine rapid drug screen (hosp performed)   Urinalysis, Routine w reflex microscopic   CBC WITH DIFFERENTIAL   Urinalysis, Microscopic (reflex)   Diet NPO time specified   Vital signs   Cardiac Monitoring   Modified Stroke Scale (mNIHSS) Document mNIHSS assessment every 2 hours for a total of 12 hours   Stroke swallow screen   Initiate Carrier Fluid Protocol   If O2 sat If O2 Sat < 94%, administer O2 at 2 liters/minute via nasal cannula.   Apply [place] cervical collar   Cardiac monitoring   Consult to neurology   Consult for [facility] Admission   Pulse oximetry, continuous   I-stat chem 8, ED   ED EKG   EKG 12-Lead   Saline lock IV   Admit to Inpatient (patient's expected length of stay will be greater than 2 midnights or inpatient only procedure)     OTHER Significant initial  Findings:  labs showing:    Recent Labs  Lab 06/28/21 1126 06/28/21 1214  NA 140 141  K 3.6 3.6  CO2 24  --   GLUCOSE 113* 113*  BUN 6* 8  CREATININE 0.87 0.80  CALCIUM 9.3  --     Cr    stable,    Lab Results  Component Value Date   CREATININE 0.80 06/28/2021   CREATININE 0.87 06/28/2021   CREATININE 0.92 04/09/2020    Recent Labs  Lab 06/28/21 1126  AST 36  ALT 15  ALKPHOS 59  BILITOT 0.7  PROT 6.7  ALBUMIN 3.5   Lab  Results  Component Value Date   CALCIUM 9.3 06/28/2021          Plt: Lab Results  Component Value Date   PLT 285 06/28/2021    COVID-19 Labs  No results for input(s): DDIMER, FERRITIN, LDH, CRP in the last 72 hours.  Lab Results  Component Value Date   [place] NEGATIVE 06/28/2021        Recent Labs  Lab 06/28/21 1126 06/28/21 1214  WBC 9.2  --   NEUTROABS 6.8  --   HGB 13.5 14.3  HCT 42.0 42.0  MCV 92.3  --   PLT 285  --     HG/HCT stable,      Component Value Date/Time   HGB 14.3 06/28/2021 1214   HGB 14.3 11/16/2017 0812   HCT 42.0 06/28/2021 1214   MCV 92.3 06/28/2021 1126      DM  labs:  HbA1C: Recent Labs    12/22/20 1533  HGBA1C 5.6    CBG (last 3)  No results for input(s): GLUCAP in the last 72 hours.    Cultures: No results found for: SDES, SPECREQUEST, CULT, REPTSTATUS   Radiological Exams on Admission: CT ANGIO HEAD NECK W WO CM  Result Date: 06/28/2021 CLINICAL DATA:  Provided history: Neuro deficit, acute, stroke suspected. Additional history provided: Fall,  left arm weakness, slurred speech. EXAM: CT ANGIOGRAPHY HEAD AND NECK TECHNIQUE: Multidetector CT imaging of the head and neck was performed using the standard protocol during bolus administration of intravenous contrast. Multiplanar CT image reconstructions and MIPs were obtained to evaluate the vascular anatomy. Carotid stenosis measurements (when applicable) are obtained utilizing NASCET criteria, using the distal internal carotid diameter as the denominator. RADIATION DOSE REDUCTION: This exam was performed according to the departmental dose-optimization program which includes automated exposure control, adjustment of the mA and/or kV according to patient size and/or use of iterative reconstruction technique. CONTRAST:  67mL OMNIPAQUE IOHEXOL 350 MG/ML SOLN COMPARISON:  No pertinent prior exams available for comparison. FINDINGS: CT HEAD FINDINGS Brain: Mild generalized cerebral and  cerebellar atrophy. 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 There is no acute intracranial hemorrhage. No demarcated cortical infarct. No extra-axial fluid collection. No evidence of an intracranial mass. No midline shift. Vascular: No hyperdense vessel.  Atherosclerotic calcifications. Skull: Normal. Negative for fracture or focal lesion. Sinuses: Minimal scattered paranasal sinus mucosal thickening. Orbits: No orbital mass or acute orbital finding. Review of the MIP images confirms the above findings CTA NECK FINDINGS Aortic arch: The left vertebral artery arises directly from the aortic arch. Atherosclerotic plaque within the visualized aortic arch and proximal major branch vessels of the neck. Streak and beam hardening artifact arising from a dense left-sided contrast bolus partially obscures the left subclavian artery. Within this limitation, there is no appreciable hemodynamically significant innominate or proximal subclavian artery stenosis. Right carotid system: CCA and ICA patent within the neck without significant stenosis (50% or greater). Mild atherosclerotic plaque a within the proximal ICA. Left carotid system: CCA and ICA patent within the neck without significant stenosis (50% or greater). Mild atherosclerotic plaque about the carotid bifurcation and within the proximal ICA. Vertebral arteries: Vertebral arteries patent within the neck without significant stenosis. Right vertebral artery dominant. Skeleton: Cervical spine separately reported on concurrently performed CT of the cervical spine. Elsewhere, no acute bony abnormality is identified. Other neck: No neck mass or cervical lymphadenopathy. Upper chest: No consolidation within the imaged lung apices. Centrilobular emphysema. Review of the MIP images confirms the above findings CTA HEAD FINDINGS Anterior circulation: The intracranial internal carotid arteries are patent. Nonstenotic calcified plaque within both vessels. The M1  middle cerebral arteries are patent. Atherosclerotic irregularity of the M2 and more distal MCA vessels, bilaterally. No M2 proximal branch occlusion or high-grade proximal stenosis is identified. The anterior cerebral arteries are patent. No intracranial aneurysm is identified. Posterior circulation: The non dominant left vertebral artery terminates predominantly as the left PICA. The dominant intracranial right vertebral artery is patent without stenosis. The basilar artery is patent. The posterior cerebral arteries are patent. Venous sinuses: Within the limitations of contrast timing, no convincing thrombus. Anatomic variants: Posterior communicating arteries are present bilaterally. Review of the MIP images confirms the above findings IMPRESSION: CT head: 1. No evidence of acute intracranial abnormality. 2. 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 3. Mild generalized cerebral and cerebellar atrophy. CTA neck: 1. The common carotid, internal carotid and vertebral arteries are patent within the neck without hemodynamically significant stenosis. Mild atherosclerotic plaque within the bilateral carotid systems within the neck, as described. 2. Aortic Atherosclerosis (ICD10-I70.0) and Emphysema (ICD10-J43.9). CTA head: 1. No intracranial large vessel occlusion or proximal high-grade arterial stenosis identified. 2. Nonstenotic atherosclerotic plaque within the intracranial ICAs. 3. Atherosclerotic irregularity of the M2 and more distal MCA vessels, bilaterally. Electronically Signed  By: Kellie Simmering D.O.   On: 06/28/2021 13:25   DG Forearm Left  Result Date: 06/28/2021 CLINICAL DATA:  Status post fall, left forearm pain EXAM: LEFT FOREARM - 2 VIEW COMPARISON:  None. FINDINGS: No acute fracture or dislocation. No aggressive osseous lesion. Normal alignment. Soft tissue are unremarkable. No radiopaque foreign body or soft tissue emphysema. IMPRESSION: 1.  No acute osseous injury of the left forearm.  Electronically Signed   By: Kathreen Devoid M.D.   On: 06/28/2021 12:52   CT Knee Left Wo Contrast  Result Date: 06/28/2021 CLINICAL DATA:  Evaluate for hemarthrosis. Bruising and swelling to LEFT knee. EXAM: CT OF THE  KNEE WITHOUT CONTRAST TECHNIQUE: Multidetector CT imaging of the knee was performed according to the standard protocol. Multiplanar CT image reconstructions were also generated. RADIATION DOSE REDUCTION: This exam was performed according to the departmental dose-optimization program which includes automated exposure control, adjustment of the mA and/or kV according to patient size and/or use of iterative reconstruction technique. COMPARISON:  None. FINDINGS: No osseous fracture line or displaced fracture fragment seen. Tricompartmental degenerative osteoarthritic changes, with joint space narrowings and osseous spurring, most pronounced at the lateral compartment. Small joint effusion, mostly localized to the suprapatellar fossa, without layering density to suggest hemarthrosis. Pronounced masslike thickening within the subcutaneous soft tissues overlying the knee (series 4, images 55 through 98), measuring up to 2.5 cm thickness (series 4, image 67) most pronounced anteriorly and medially. This could represent a soft tissue mass or phlegmonous-like infectious collection. Additional ill-defined fluid stranding throughout the subcutaneous soft tissues about the knee and proximal tib-fib, suggesting underlying cellulitis. IMPRESSION: 1. Pronounced masslike thickening within the subcutaneous soft tissues overlying the knee, measuring up to 2.5 cm thickness, most pronounced anteriorly and medially. This could represent a soft tissue mass or phlegmonous-like infectious collection. If any recent injury, this could represent soft tissue hematoma. 2. Additional ill-defined fluid stranding throughout the subcutaneous soft tissues about the knee and proximal tib-fib, suggesting underlying cellulitis. 3. Small  joint effusion, mostly localized to the suprapatellar fossa, without layering density to suggest hemarthrosis. 4. Tricompartmental degenerative osteoarthritic changes, most pronounced at the lateral compartment. 5. No acute-appearing osseous abnormality. Electronically Signed   By: Franki Cabot M.D.   On: 06/28/2021 19:23   MR ANGIO HEAD WO CONTRAST  Result Date: 06/28/2021 CLINICAL DATA:  Mental status change, unknown cause. EXAM: MRI HEAD WITHOUT CONTRAST MRA HEAD WITHOUT CONTRAST MRA NECK WITHOUT CONTRAST TECHNIQUE: Multiplanar, multi-echo pulse sequences of the brain and surrounding structures were acquired without intravenous contrast. Angiographic images of the Circle of Willis were acquired using MRA technique without intravenous contrast. Angiographic images of the neck were acquired using MRA technique without intravenous contrast. Carotid stenosis measurements (when applicable) are obtained utilizing NASCET criteria, using the distal internal carotid diameter as the denominator. COMPARISON:  Noncontrast head CT and CT angiogram head/neck performed earlier today 06/28/2021. FINDINGS: MRI HEAD FINDINGS Brain: Mild intermittent motion degradation. Mild generalized cerebral and cerebellar atrophy. There are fairly numerous scattered subcentimeter acute infarcts within the bilateral cerebral hemispheres, right basal ganglia, posterior limb of right internal capsule, left thalamus and within the bilateral cerebellar hemispheres. Involvement of multiple vascular territories raises suspicion for an embolic process. Background minimal multifocal T2 FLAIR hyperintense signal abnormality within the cerebral white matter, nonspecific but compatible with chronic small vessel ischemic disease. 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 There are a few nonspecific supratentorial chronic parenchymal microhemorrhages. No evidence of an  intracranial mass. No extra-axial fluid  collection. No midline shift. Vascular: Maintained flow voids within the proximal large arterial vessels. Skull and upper cervical spine: No focal suspicious marrow lesion. Sinuses/Orbits: Visualized orbits show no acute finding. Bilateral ocular lens replacements. Mild mucosal thickening within the left frontal sinus and within a posterior left ethmoid air cell. MRA HEAD FINDINGS Mildly motion degraded exam. Anterior circulation: The intracranial internal carotid arteries are patent. Minimal atherosclerotic irregularity of both vessels. The M1 middle cerebral arteries are patent. No M2 proximal branch occlusion or high-grade proximal stenosis is identified. Mild atherosclerotic irregularity of the M2 and more distal MCA vessels, bilaterally. The anterior cerebral arteries are patent. No intracranial aneurysm is identified. Posterior circulation: The intracranial vertebral arteries are patent. The non dominant left vertebral artery is developmentally diminutive beyond the origin of the left PICA. The basilar artery is patent. The posterior cerebral arteries are patent. Posterior communicating arteries are present bilaterally. Anatomic variants: None significant. MRA NECK FINDINGS Aortic arch: Motion degradation and noncontrast technique limits evaluation of the aortic arch and origins of the great vessels of the neck. Right carotid system: Within described limitations, the CCA and ICA are patent within the neck without hemodynamically significant stenosis (50% or greater). Mild atherosclerotic plaque within the proximal ICA. Left carotid system: Within described limitations, the CCA and ICA are patent within the neck without hemodynamically significant stenosis. Mild atherosclerotic plaque within the carotid bifurcation and proximal ICA. Vertebral arteries: Within described limitations, the vertebral arteries are patent within the neck without hemodynamically significant stenosis. The right vertebral artery is  dominant. IMPRESSION: MRI brain: 1. Mildly motion degraded exam. 2. Fairly numerous subcentimeter acute infarcts within the bilateral cerebral hemispheres, right basal ganglia, posterior limb of right internal capsule, left thalamus and within the bilateral cerebellar hemispheres. Involvement of multiple vascular territories raises suspicion for an embolic process. 3. Background minimal chronic small-vessel ischemic changes within the cerebral white matter. 4. 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 5. Mild generalized parenchymal atrophy. 6. Mild paranasal sinus disease, as described. MRA head: 1. No intracranial large vessel occlusion or proximal high-grade arterial stenosis. 2. Intracranial atherosclerotic disease, as described. MRA neck: 1. Motion degradation and non-contrast technique limits evaluation of the aortic arch and proximal major branch vessels of the neck. 2. Within this limitation, the common carotid, internal carotid and vertebral arteries are patent within neck without hemodynamically significant stenosis. Mild atherosclerotic plaque within the bilateral carotid systems within the neck, as described. Electronically Signed   By: Kellie Simmering D.O.   On: 06/28/2021 18:07   MR ANGIO NECK WO CONTRAST  Result Date: 06/28/2021 CLINICAL DATA:  Mental status change, unknown cause. EXAM: MRI HEAD WITHOUT CONTRAST MRA HEAD WITHOUT CONTRAST MRA NECK WITHOUT CONTRAST TECHNIQUE: Multiplanar, multi-echo pulse sequences of the brain and surrounding structures were acquired without intravenous contrast. Angiographic images of the Circle of Willis were acquired using MRA technique without intravenous contrast. Angiographic images of the neck were acquired using MRA technique without intravenous contrast. Carotid stenosis measurements (when applicable) are obtained utilizing NASCET criteria, using the distal internal carotid diameter as the denominator. COMPARISON:   Noncontrast head CT and CT angiogram head/neck performed earlier today 06/28/2021. FINDINGS: MRI HEAD FINDINGS Brain: Mild intermittent motion degradation. Mild generalized cerebral and cerebellar atrophy. There are fairly numerous scattered subcentimeter acute infarcts within the bilateral cerebral hemispheres, right basal ganglia, posterior limb of right internal capsule, left thalamus and within the bilateral cerebellar hemispheres. Involvement of multiple  vascular territories raises suspicion for an embolic process. Background minimal multifocal T2 FLAIR hyperintense signal abnormality within the cerebral white matter, nonspecific but compatible with chronic small vessel ischemic disease. 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 There are a few nonspecific supratentorial chronic parenchymal microhemorrhages. No evidence of an intracranial mass. No extra-axial fluid collection. No midline shift. Vascular: Maintained flow voids within the proximal large arterial vessels. Skull and upper cervical spine: No focal suspicious marrow lesion. Sinuses/Orbits: Visualized orbits show no acute finding. Bilateral ocular lens replacements. Mild mucosal thickening within the left frontal sinus and within a posterior left ethmoid air cell. MRA HEAD FINDINGS Mildly motion degraded exam. Anterior circulation: The intracranial internal carotid arteries are patent. Minimal atherosclerotic irregularity of both vessels. The M1 middle cerebral arteries are patent. No M2 proximal branch occlusion or high-grade proximal stenosis is identified. Mild atherosclerotic irregularity of the M2 and more distal MCA vessels, bilaterally. The anterior cerebral arteries are patent. No intracranial aneurysm is identified. Posterior circulation: The intracranial vertebral arteries are patent. The non dominant left vertebral artery is developmentally diminutive beyond the origin of the left PICA. The basilar artery  is patent. The posterior cerebral arteries are patent. Posterior communicating arteries are present bilaterally. Anatomic variants: None significant. MRA NECK FINDINGS Aortic arch: Motion degradation and noncontrast technique limits evaluation of the aortic arch and origins of the great vessels of the neck. Right carotid system: Within described limitations, the CCA and ICA are patent within the neck without hemodynamically significant stenosis (50% or greater). Mild atherosclerotic plaque within the proximal ICA. Left carotid system: Within described limitations, the CCA and ICA are patent within the neck without hemodynamically significant stenosis. Mild atherosclerotic plaque within the carotid bifurcation and proximal ICA. Vertebral arteries: Within described limitations, the vertebral arteries are patent within the neck without hemodynamically significant stenosis. The right vertebral artery is dominant. IMPRESSION: MRI brain: 1. Mildly motion degraded exam. 2. Fairly numerous subcentimeter acute infarcts within the bilateral cerebral hemispheres, right basal ganglia, posterior limb of right internal capsule, left thalamus and within the bilateral cerebellar hemispheres. Involvement of multiple vascular territories raises suspicion for an embolic process. 3. Background minimal chronic small-vessel ischemic changes within the cerebral white matter. 4. 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 5. Mild generalized parenchymal atrophy. 6. Mild paranasal sinus disease, as described. MRA head: 1. No intracranial large vessel occlusion or proximal high-grade arterial stenosis. 2. Intracranial atherosclerotic disease, as described. MRA neck: 1. Motion degradation and non-contrast technique limits evaluation of the aortic arch and proximal major branch vessels of the neck. 2. Within this limitation, the common carotid, internal carotid and vertebral arteries are patent within neck  without hemodynamically significant stenosis. Mild atherosclerotic plaque within the bilateral carotid systems within the neck, as described. Electronically Signed   By: Kellie Simmering D.O.   On: 06/28/2021 18:07   MR BRAIN WO CONTRAST  Result Date: 06/28/2021 CLINICAL DATA:  Mental status change, unknown cause. EXAM: MRI HEAD WITHOUT CONTRAST MRA HEAD WITHOUT CONTRAST MRA NECK WITHOUT CONTRAST TECHNIQUE: Multiplanar, multi-echo pulse sequences of the brain and surrounding structures were acquired without intravenous contrast. Angiographic images of the Circle of Willis were acquired using MRA technique without intravenous contrast. Angiographic images of the neck were acquired using MRA technique without intravenous contrast. Carotid stenosis measurements (when applicable) are obtained utilizing NASCET criteria, using the distal internal carotid diameter as the denominator. COMPARISON:  Noncontrast head CT and CT angiogram head/neck  performed earlier today 06/28/2021. FINDINGS: MRI HEAD FINDINGS Brain: Mild intermittent motion degradation. Mild generalized cerebral and cerebellar atrophy. There are fairly numerous scattered subcentimeter acute infarcts within the bilateral cerebral hemispheres, right basal ganglia, posterior limb of right internal capsule, left thalamus and within the bilateral cerebellar hemispheres. Involvement of multiple vascular territories raises suspicion for an embolic process. Background minimal multifocal T2 FLAIR hyperintense signal abnormality within the cerebral white matter, nonspecific but compatible with chronic small vessel ischemic disease. 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 There are a few nonspecific supratentorial chronic parenchymal microhemorrhages. No evidence of an intracranial mass. No extra-axial fluid collection. No midline shift. Vascular: Maintained flow voids within the proximal large arterial vessels. Skull and upper  cervical spine: No focal suspicious marrow lesion. Sinuses/Orbits: Visualized orbits show no acute finding. Bilateral ocular lens replacements. Mild mucosal thickening within the left frontal sinus and within a posterior left ethmoid air cell. MRA HEAD FINDINGS Mildly motion degraded exam. Anterior circulation: The intracranial internal carotid arteries are patent. Minimal atherosclerotic irregularity of both vessels. The M1 middle cerebral arteries are patent. No M2 proximal branch occlusion or high-grade proximal stenosis is identified. Mild atherosclerotic irregularity of the M2 and more distal MCA vessels, bilaterally. The anterior cerebral arteries are patent. No intracranial aneurysm is identified. Posterior circulation: The intracranial vertebral arteries are patent. The non dominant left vertebral artery is developmentally diminutive beyond the origin of the left PICA. The basilar artery is patent. The posterior cerebral arteries are patent. Posterior communicating arteries are present bilaterally. Anatomic variants: None significant. MRA NECK FINDINGS Aortic arch: Motion degradation and noncontrast technique limits evaluation of the aortic arch and origins of the great vessels of the neck. Right carotid system: Within described limitations, the CCA and ICA are patent within the neck without hemodynamically significant stenosis (50% or greater). Mild atherosclerotic plaque within the proximal ICA. Left carotid system: Within described limitations, the CCA and ICA are patent within the neck without hemodynamically significant stenosis. Mild atherosclerotic plaque within the carotid bifurcation and proximal ICA. Vertebral arteries: Within described limitations, the vertebral arteries are patent within the neck without hemodynamically significant stenosis. The right vertebral artery is dominant. IMPRESSION: MRI brain: 1. Mildly motion degraded exam. 2. Fairly numerous subcentimeter acute infarcts within the  bilateral cerebral hemispheres, right basal ganglia, posterior limb of right internal capsule, left thalamus and within the bilateral cerebellar hemispheres. Involvement of multiple vascular territories raises suspicion for an embolic process. 3. Background minimal chronic small-vessel ischemic changes within the cerebral white matter. 4. 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 5. Mild generalized parenchymal atrophy. 6. Mild paranasal sinus disease, as described. MRA head: 1. No intracranial large vessel occlusion or proximal high-grade arterial stenosis. 2. Intracranial atherosclerotic disease, as described. MRA neck: 1. Motion degradation and non-contrast technique limits evaluation of the aortic arch and proximal major branch vessels of the neck. 2. Within this limitation, the common carotid, internal carotid and vertebral arteries are patent within neck without hemodynamically significant stenosis. Mild atherosclerotic plaque within the bilateral carotid systems within the neck, as described. Electronically Signed   By: Kellie Simmering D.O.   On: 06/28/2021 18:07   CT C-SPINE NO CHARGE  Result Date: 06/28/2021 CLINICAL DATA:  81 year old female with fall. EXAM: CT CERVICAL SPINE WITHOUT CONTRAST TECHNIQUE: Multidetector CT imaging of the cervical spine was performed without intravenous contrast. Multiplanar CT image reconstructions were also generated. RADIATION DOSE REDUCTION: This exam was performed according  to the departmental dose-optimization program which includes automated exposure control, adjustment of the mA and/or kV according to patient size and/or use of iterative reconstruction technique. COMPARISON:  None. FINDINGS: Alignment: Normal. Skull base and vertebrae: No acute fracture. No primary bone lesion or focal pathologic process. Soft tissues and spinal canal: No prevertebral fluid or swelling. No visible canal hematoma. Disc levels: Mild multilevel  degenerative disc disease/spondylosis and mild to moderate multilevel facet arthropathy noted. These findings contribute to mild bony RIGHT foraminal narrowing at C3-4. Upper chest: No acute abnormality Other: None IMPRESSION: 1. No static evidence of acute injury to the cervical spine. 2. Mild multilevel degenerative disc disease/spondylosis and mild to moderate multilevel facet arthropathy. CTA head/neck is dictated in a separate report. Electronically Signed   By: Margarette Canada M.D.   On: 06/28/2021 13:25   DG Chest Portable 1 View  Result Date: 06/28/2021 CLINICAL DATA:  Fall EXAM: PORTABLE CHEST 1 VIEW COMPARISON:  None. FINDINGS: Lungs are clear. No pneumothorax or pleural effusion. Cardiac size is mildly enlarged. Prominent calcification of the mitral valve annulus noted. Rounded opacity within the right perihilar region represents vascular shadow, better assessed on prior CT examination of 07/28/2006. Pulmonary vascularity is otherwise normal. No acute bone abnormality. IMPRESSION: No radiographic evidence of acute cardiopulmonary disease. Mild cardiomegaly Electronically Signed   By: Fidela Salisbury M.D.   On: 06/28/2021 19:41   DG Knee Complete 4 Views Left  Result Date: 06/28/2021 CLINICAL DATA:  Status post fall.  Left arm pain.  Left knee pain. EXAM: LEFT KNEE - COMPLETE 4+ VIEW COMPARISON:  None. FINDINGS: No acute fracture or dislocation. No aggressive osseous lesion. Normal alignment. Generalized osteopenia. Moderate left lateral femorotibial compartment joint space narrowing with marginal osteophytes. Mild medial femorotibial compartment joint space narrowing marginal osteophytes. Severe soft tissue swelling along the anterior aspect of the knee. No radiopaque foreign body or soft tissue emphysema. IMPRESSION: 1. No acute osseous injury of the left knee. 2. Severe soft tissue swelling along the anterior aspect of the knee consistent with soft tissue contusion. Electronically Signed   By: Kathreen Devoid M.D.   On: 06/28/2021 12:53   _______________________________________________________________________________________________________ Latest   Blood pressure 116/81, pulse 68, temperature 98.7 F (37.1 C), temperature source Oral, resp. rate (!) 21, height 5\' 11"  (1.803 m), weight 88.5 kg, SpO2 97 %.   Vitals  labs and radiology finding personally reviewed  Review of Systems:    Pertinent positives include:  localizing neurological complaints,  Constitutional:  No weight loss, night sweats, Fevers, chills, fatigue, weight loss  HEENT:  No headaches, Difficulty swallowing,Tooth/dental problems,Sore throat,  No sneezing, itching, ear ache, nasal congestion, post nasal drip,  Cardio-vascular:  No chest pain, Orthopnea, PND, anasarca, dizziness, palpitations.no Bilateral lower extremity swelling  GI:  No heartburn, indigestion, abdominal pain, nausea, vomiting, diarrhea, change in bowel habits, loss of appetite, melena, blood in stool, hematemesis Resp:  no shortness of breath at rest. No dyspnea on exertion, No excess mucus, no productive cough, No non-productive cough, No coughing up of blood.No change in color of mucus.No wheezing. Skin:  no rash or lesions. No jaundice GU:  no dysuria, change in color of urine, no urgency or frequency. No straining to urinate.  No flank pain.  Musculoskeletal:  No joint pain or no joint swelling. No decreased range of motion. No back pain.  Psych:  No change in mood or affect. No depression or anxiety. No memory loss.  Neuro: no  no tingling, no  weakness, no double vision, no gait abnormality, no slurred speech, no confusion  All systems reviewed and apart from [place] all are negative _______________________________________________________________________________________________ Past Medical History:   Past Medical History:  Diagnosis Date   Asthma    Chronic airway obstruction, not elsewhere classified    Diastolic dysfunction without  heart failure    Disorder of bone and cartilage, unspecified    Dysrhythmia    A-Fib   Family history of colon cancer    Family history of kidney cancer    Family history of ovarian cancer    Paroxysmal atrial flutter ([place])    Stroke ([place])    TIA   TIA (transient ischemic attack)    Unspecified asthma(493.90)      Past Surgical History:  Procedure Laterality Date   ABDOMINAL HYSTERECTOMY     BREAST LUMPECTOMY Right 12/30/2017   BREAST LUMPECTOMY WITH RADIOACTIVE SEED LOCALIZATION Right 12/30/2017   Procedure: BREAST LUMPECTOMY WITH RADIOACTIVE SEED LOCALIZATION X'S 3;  Surgeon: Rolm Bookbinder, MD;  Location: [facility];  Service: General;  Laterality: Right;   BUNIONECTOMY Bilateral    EYE SURGERY Bilateral    cataract removal    Social History:  Ambulatory   independently     reports that she quit smoking about 44 years ago. Her smoking use included cigarettes. She has never used smokeless tobacco. She reports that she does not drink alcohol and does not use drugs.     Family History:   Family History  Problem Relation Age of Onset   COPD Father    Colon cancer Mother 18       mets to pancreas and liver   Kidney cancer Sister 57       d. 51   Lung cancer Sister        d. 48, smoker   Ovarian cancer Sister 79       d. 17   Cirrhosis Sister        d. 67   Melanoma Sister 72   Ovarian cancer Maternal Grandmother        d. 84   Heart disease Maternal Grandfather        rheumatic heart disease   Rectal cancer Paternal Grandfather        d. 70   Parkinson's disease Brother    Ovarian cancer Maternal Aunt    Colon cancer Maternal Uncle        d. 72-73   Heart attack Paternal Uncle        d. 24   Kidney cancer Brother 88   Colon cancer Maternal Uncle        d. 77   Cancer Other        MGMs pat 1/2 sister with uterine and rectal cancer   Colon cancer Cousin        d. 36 - mat first cousin   Breast cancer Neg Hx     ______________________________________________________________________________________________ Allergies: Allergies  Allergen Reactions   Clarithromycin Anaphylaxis    "just about killed me"   Ibuprofen Hives   Codeine Nausea Only   Codeine Phosphate Nausea Only   Diltiazem Rash   Tape Itching and Other (See Comments)    Tears skin   Tapentadol Itching    Other reaction(s): Other (See Comments) Tears skin   Verapamil Hcl Er Rash     Prior to Admission medications   Medication Sig Start Date End Date Taking? Authorizing Provider  albuterol (VENTOLIN HFA) 108 (90 Base) MCG/ACT inhaler INHALE  2 PUFFS INTO LUNGS EVERY 4 HOURS AS NEEDED 12/24/20   Baird Lyons D, MD  cholecalciferol (VITAMIN D) 1000 units tablet Take 1,000 Units by mouth daily.    [provider]  clobetasol cream (TEMOVATE) 0.99 % Apply 1 application topically as needed.    [provider]  fish oil-omega-3 fatty acids 1000 MG capsule Take 1 g by mouth daily.    [provider]  Fluticasone-Salmeterol (WIXELA INHUB) 250-50 MCG/DOSE AEPB Inhale 1 puff into the lungs 2 (two) times daily. 07/24/20   Baird Lyons D, MD  ipratropium-albuterol (DUONEB) 0.5-2.5 (3) MG/3ML SOLN Take 3 mLs by nebulization every 6 (six) hours as needed. 05/30/20   Baird Lyons D, MD  letrozole [facility]) 2.5 MG tablet Take 1 tablet by mouth once daily 08/21/20   Nicholas Lose, MD  MEGARED OMEGA-3 KRILL OIL 500 MG CAPS Take 500 mg by mouth daily.     [provider]  molnupiravir EUA (LAGEVRIO) 200 mg CAPS capsule Take 4 capsules (800 mg total) by mouth 2 (two) times daily for 5 days. 06/26/21 07/01/21  Barrett Henle, MD  Multiple Vitamin (MULTIVITAMIN) tablet Take 1 tablet by mouth daily.    [provider]  predniSONE (STERAPRED UNI-PAK 21 TAB) 10 MG (21) TBPK tablet Take by mouth daily. Take 6 tabs by mouth daily  for 2 days, then 5 tabs for 2 days, then 4 tabs for 2 days, then 3 tabs for 2 days, 2  tabs for 2 days, then 1 tab by mouth daily for 2 days 04/13/21   Hans Eden, NP  rivaroxaban (XARELTO) 20 MG TABS tablet TAKE 1 TABLET BY MOUTH ONCE DAILY WITH SUPPER 12/23/20   Jerline Pain, MD  triamcinolone ointment (KENALOG) 0.1 % Apply 1 application topically 2 (two) times daily as needed (for itching).    [provider]    ___________________________________________________________________________________________________ Physical Exam: Vitals with BMI 06/28/2021 06/28/2021 06/28/2021  Height - - -  Weight - - -  BMI - - -  Systolic [phone]  Diastolic 81 70 80  Pulse 68 63 66     1. General:  in No  Acute distress   Chronically ill   -appearing 2. Psychological: Alert and  Oriented 3. Head/ENT:   Dry Mucous Membranes                          Head Non traumatic, neck supple                           Poor Dentition 4. SKIN:  decreased Skin turgor,  Skin clean Dry multiple abrasions, left knee with severe swelling and blister on top 5. Heart: Regular rate and rhythm no  Murmur, no Rub or gallop 6. Lungs:  no wheezes or crackles   7. Abdomen: Soft,  non-tender, Non distended   obese  bowel sounds present 8. Lower extremities: no clubbing, cyanosis, no  edema 9. Neurologically left side weakness mild left facial droop 10. MSK: Normal range of motion    Chart has been reviewed  ______________________________________________________________________________________________  Assessment/Plan 81 y.o. female with medical history significant of atrial fibrillation on Xarelto, COPD, remote history of breast cancer,  Admitted for multiple embolic CVAs  Present on Admission:  CVA (cerebral vascular accident) ([place])  Asthma with COPD ([place])  Paroxysmal atrial flutter ([place])  Chronic diastolic CHF (congestive heart failure) ([place])  Thrombophilia ([place])  Hematoma of left knee region     Chronic diastolic CHF (congestive heart failure) (HCC) Chronic stable, avoid fluid  overload  Asthma with COPD ([place]) Stable continue home meds  Thrombophilia ([place]) In the setting of atrial fibrillation.  Patient chronically on the Xarelto  CVA (cerebral vascular accident) [facility])  - will admit based on TIA/CVA protocol,        Monitor on Tele       MRA/MRI  Resulted -showing multiple embolic acute ischemic CVAs     MRA neck no significant obstruction       Echo to evaluate for possible embolic source,        obtain cardiac enzymes,  ECG,   Lipid panel, TSH.        Order PT/OT evaluation.        keep nothing by mouth until passes swallow eval        Will make sure patient is on Xarelto if no evidence of hemarthrosis in the knee. CT scan neg for hemarthrosis Will restart xarelto       Allow permissive Hypertension keep BP <180 /120        Neurology consulted Have seen pt in ER     Hematoma of left knee region Supportive measures, monitor while on xarelto Monitor for any sign of infection  Malignant neoplasm of lower-outer quadrant of right breast of female, estrogen receptor positive (HCC) Chronic on Femara will continue followed by Dr. Lindi Adie  Fall at home, initial encounter PT oT assessment  Paroxysmal atrial flutter ([place]) Not on any beta-blocker currently rate controlled continue Xarelto Given embolic CVA obtain echogram   Other plan as per orders.  DVT prophylaxis:   Xarelto    Code Status:    Code Status: Not on file FULL CODE  as per patient   I had personally discussed CODE STATUS with patient and family     Family Communication:   Family  at  Bedside  plan of care was discussed   with  Son,   Disposition Plan:     likely will need placement for rehabilitation                           Following barriers for discharge:                                                         Pain controlled with PO medications                                                           Will need to be able to tolerate PO                                                    Will need consultants to evaluate patient prior to discharge  Would benefit from PT/OT eval prior to DC  Ordered                   Swallow eval - SLP ordered                     Consults called: Neurology is aware    Admission status:  ED Disposition     ED Disposition  [place]: [facility] [[id number]]  Level of Care: Telemetry Medical [104]  May admit patient to [facility] or [facility] if equivalent level of care is available:: No  Covid Evaluation: Asymptomatic Screening Protocol (No Symptoms)  Diagnosis: CVA (cerebral vascular accident) [facility]) [[id number]]  Admitting Physician: Toy Baker [[id number]]  Attending Physician: Toy Baker [[id number]]  Estimated length of stay: past midnight tomorrow  Certification:: I certify this patient will need inpatient services for at least 2 midnights            inpatient     I Expect 2 midnight stay secondary to severity of patient's current illness need for inpatient interventions justified by the following:    Severe lab/radiological/exam abnormalities including:   New multiple embolic CVAs and extensive comorbidities including:  CHF    COPD/asthma     malignancy,   Chronic anticoagulation  That are currently affecting medical management.   I expect  patient to be hospitalized for 2 midnights requiring inpatient medical care.     Need for stroke work-up   Level of care     tele  indefinitely please discontinue once patient no longer qualifies COVID-19 Labs    Lab Results  Component Value Date   [place] NEGATIVE 06/28/2021     Precautions: admitted as   Covid Negative     Saivon Prowse 06/28/2021, 9:40 PM    [facility]     after 2 AM please page floor coverage PA If 7AM-7PM, please contact the day team taking care of the patient using [email or web address]   Patient was evaluated in the context of the  global COVID-19 pandemic, which necessitated consideration that the patient might be at risk for infection with the SARS-CoV-2 virus that causes COVID-19. Institutional protocols and algorithms that pertain to the evaluation of patients at risk for COVID-19 are in a state of rapid change based on information released by regulatory bodies including the CDC and federal and state organizations. These policies and algorithms were followed during the patient's care.

## 2021-06-28 NOTE — Assessment & Plan Note (Signed)
In the setting of atrial fibrillation.  Patient chronically on the Xarelto

## 2021-06-28 NOTE — ED Notes (Addendum)
Pt reports her inhaler is missing. Pt had inhaler in her hand prior to CT and reports she hasn't seen it since returning back from CT. Calling CT to see if they can locate inhaler. Per CT, pt had inhaler in her hand the entire time she was with them and was returned to her room in the ED with the inhaler in hand. Called MRI and they report pt did not have inhaler with her at that time.

## 2021-06-28 NOTE — Assessment & Plan Note (Signed)
PT oT assessment

## 2021-06-28 NOTE — Assessment & Plan Note (Addendum)
Stable - continue home meds 

## 2021-06-28 NOTE — ED Provider Notes (Addendum)
[facility] Provider Note   CSN: [id number] Arrival date & time: 06/28/21  1112     History  Chief Complaint  Patient presents with   Stroke Symptoms    Decreased sensation to L arm, slurred speech   Fall    W/ skin tear to L arm    Laura Mendez is a 81 y.o. female.   Fall   81 year old female presenting to the emergency department with concern for stroke.  The history is provided by the patient in addition to EMS.  The patient presents with strokelike symptoms.  She was last known normal at 2100 when she went to bed.  This morning at 0 300 she woke up and felt due to weakness in her legs.  She described "my legs giving out" and she subsequently fell to the ground landing on her left elbow and left knee.  She sustained acute skin tear to the left elbow.  She states that her tetanus is up-to-date.  She also sustained a hematoma to the left knee.  She is on Xarelto.  She denies any head trauma or loss of consciousness.  At that same time, she felt lightheaded and felt left arm weakness after the fall.  Her son arrived to the house around 0 600 at 0 700 and she was noted to have slurred speech but no other abnormalities.  Home Medications Prior to Admission medications   Medication Sig Start Date End Date Taking? Authorizing Provider  albuterol (VENTOLIN HFA) 108 (90 Base) MCG/ACT inhaler INHALE 2 PUFFS INTO LUNGS EVERY 4 HOURS AS NEEDED 12/24/20  Yes Young, Tarri Fuller D, MD  cholecalciferol (VITAMIN D) 1000 units tablet Take 1,000 Units by mouth daily.   Yes [provider]  clobetasol cream (TEMOVATE) 8.28 % Apply 1 application topically as needed (rash).   Yes [provider]  DUPIXENT 300 MG/2ML SOPN Inject 2 mLs into the skin every 30 (thirty) days. 06/26/21  Yes [provider]  fish oil-omega-3 fatty acids 1000 MG capsule Take 1 g by mouth daily.   Yes [provider]  Fluticasone-Salmeterol (WIXELA  INHUB) 250-50 MCG/DOSE AEPB Inhale 1 puff into the lungs 2 (two) times daily. 07/24/20  Yes Young, Clinton D, MD  ipratropium-albuterol (DUONEB) 0.5-2.5 (3) MG/3ML SOLN Take 3 mLs by nebulization every 6 (six) hours as needed. 05/30/20  Yes Baird Lyons D, MD  letrozole [facility]) 2.5 MG tablet Take 1 tablet by mouth once daily Patient taking differently: Take 2.5 mg by mouth at bedtime. 08/21/20  Yes Nicholas Lose, MD  Multiple Vitamin (MULTIVITAMIN) tablet Take 1 tablet by mouth daily.   Yes [provider]  rivaroxaban (XARELTO) 20 MG TABS tablet TAKE 1 TABLET BY MOUTH ONCE DAILY WITH SUPPER 12/23/20  Yes Jerline Pain, MD  triamcinolone ointment (KENALOG) 0.1 % Apply 1 application topically 2 (two) times daily as needed (for itching).   Yes [provider]  hydrOXYzine (ATARAX) 10 MG tablet Take 10-20 mg by mouth at bedtime. Patient not taking: Reported on 06/28/2021 06/05/21   [provider]  MEGARED OMEGA-3 KRILL OIL 500 MG CAPS Take 500 mg by mouth daily.  Patient not taking: Reported on 06/28/2021    [provider]  molnupiravir EUA (LAGEVRIO) 200 mg CAPS capsule Take 4 capsules (800 mg total) by mouth 2 (two) times daily for 5 days. Patient not taking: Reported on 06/28/2021 06/26/21 07/01/21  Barrett Henle, MD  predniSONE (STERAPRED UNI-PAK 21 TAB) 10  MG (21) TBPK tablet Take by mouth daily. Take 6 tabs by mouth daily  for 2 days, then 5 tabs for 2 days, then 4 tabs for 2 days, then 3 tabs for 2 days, 2 tabs for 2 days, then 1 tab by mouth daily for 2 days Patient not taking: Reported on 06/28/2021 04/13/21   Hans Eden, NP      Allergies    Clarithromycin, Ibuprofen, Codeine, Codeine phosphate, Diltiazem, Tape, Tapentadol, and Verapamil hcl er    Review of Systems   Review of Systems  Skin:  Positive for wound.  Neurological:  Positive for speech difficulty and weakness.  All other systems reviewed and are negative.  Physical Exam Updated Vital  Signs BP 116/81    Pulse 68    Temp 98.7 F (37.1 C) (Oral)    Resp (!) 21    Ht 5\' 11"  (1.803 m)    Wt 88.5 kg    SpO2 97%    BMI 27.20 kg/m  Physical Exam Vitals and nursing note reviewed.  Constitutional:      General: She is not in acute distress.    Appearance: She is well-developed.     Comments: GCS 15, ABC intact  HENT:     Head: Normocephalic and atraumatic.  Eyes:     Extraocular Movements: Extraocular movements intact.     Conjunctiva/sclera: Conjunctivae normal.     Pupils: Pupils are equal, round, and reactive to light.  Neck:     Comments: No midline tenderness to palpation of the cervical spine.  Range of motion intact Cardiovascular:     Rate and Rhythm: Normal rate and regular rhythm.     Heart sounds: No murmur heard. Pulmonary:     Effort: Pulmonary effort is normal. No respiratory distress.     Breath sounds: Normal breath sounds.  Chest:     Comments: Clavicles stable nontender to AP compression.  Chest wall stable and nontender to AP and lateral compression. Abdominal:     General: There is no distension.     Palpations: Abdomen is soft.     Tenderness: There is no abdominal tenderness. There is no guarding.  Musculoskeletal:        General: No deformity or signs of injury.     Cervical back: Neck supple.     Comments: No midline tenderness to palpation of the thoracic or lumbar spine.  Extremities atraumatic with intact range of motion  Skin:    General: Skin is warm and dry.     Findings: No lesion or rash.  Neurological:     General: No focal deficit present.     Mental Status: She is alert. Mental status is at baseline.     Comments: MENTAL STATUS EXAM:    Orientation: Alert and oriented to person, place and time.  Memory: Cooperative, follows commands well.  Language: Speech is clear and language is normal.   CRANIAL NERVES:    CN 2 (Optic): Visual fields intact to confrontation.  CN 3,4,6 (EOM): Pupils equal and reactive to light. Full  extraocular eye movement without nystagmus.  CN 5 (Trigeminal): Facial sensation is normal, no weakness of masticatory muscles.  CN 7 (Facial): No facial weakness or asymmetry.  CN 8 (Auditory): Auditory acuity grossly normal.  CN 9,10 (Glossophar): The uvula is midline, the palate elevates symmetrically.  CN 11 (spinal access): Normal sternocleidomastoid and trapezius strength.  CN 12 (Hypoglossal): The tongue is midline. No atrophy or fasciculations.Marland Kitchen   MOTOR:  Muscle Strength: 5/5RUE, 4/5LUE, 5/5RLE, 5/5LLE.   COORDINATION:   Intact finger-to-nose, no tremor.   SENSATION:   Intact to light touch all four extremities.       ED Results / Procedures / Treatments   Labs (all labs ordered are listed, but only abnormal results are displayed) Labs Reviewed  PROTIME-INR - Abnormal; Notable for the following components:      Result Value   Prothrombin Time 19.8 (*)    INR 1.7 (*)    All other components within normal limits  APTT - Abnormal; Notable for the following components:   aPTT 37 (*)    All other components within normal limits  COMPREHENSIVE METABOLIC PANEL - Abnormal; Notable for the following components:   Glucose, Bld 113 (*)    BUN 6 (*)    All other components within normal limits  URINALYSIS, ROUTINE W REFLEX MICROSCOPIC - Abnormal; Notable for the following components:   Hgb urine dipstick TRACE (*)    All other components within normal limits  I-STAT CHEM 8, ED - Abnormal; Notable for the following components:   Glucose, Bld 113 (*)    All other components within normal limits  RESP PANEL BY RT-PCR (FLU A&B, COVID) ARPGX2  ETHANOL  RAPID URINE DRUG SCREEN, HOSP PERFORMED  CBC WITH DIFFERENTIAL/PLATELET  URINALYSIS, MICROSCOPIC (REFLEX)  CK  MAGNESIUM  HEPATIC FUNCTION PANEL  PHOSPHORUS    EKG EKG Interpretation  Date/Time:  Sunday June 28 2021 11:40:50 EST Ventricular Rate:  67 PR Interval:  136 QRS Duration: 95 QT Interval:  373 QTC  Calculation: 394 R Axis:   36 Text Interpretation: Sinus rhythm Confirmed by Regan Lemming ([id number]) on 06/28/2021 5:13:54 PM  Radiology CT ANGIO HEAD NECK W WO CM  Result Date: 06/28/2021 CLINICAL DATA:  Provided history: Neuro deficit, acute, stroke suspected. Additional history provided: Fall, left arm weakness, slurred speech. EXAM: CT ANGIOGRAPHY HEAD AND NECK TECHNIQUE: Multidetector CT imaging of the head and neck was performed using the standard protocol during bolus administration of intravenous contrast. Multiplanar CT image reconstructions and MIPs were obtained to evaluate the vascular anatomy. Carotid stenosis measurements (when applicable) are obtained utilizing NASCET criteria, using the distal internal carotid diameter as the denominator. RADIATION DOSE REDUCTION: This exam was performed according to the departmental dose-optimization program which includes automated exposure control, adjustment of the mA and/or kV according to patient size and/or use of iterative reconstruction technique. CONTRAST:  70mL OMNIPAQUE IOHEXOL 350 MG/ML SOLN COMPARISON:  No pertinent prior exams available for comparison. FINDINGS: CT HEAD FINDINGS Brain: Mild generalized cerebral and cerebellar atrophy. 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 There is no acute intracranial hemorrhage. No demarcated cortical infarct. No extra-axial fluid collection. No evidence of an intracranial mass. No midline shift. Vascular: No hyperdense vessel.  Atherosclerotic calcifications. Skull: Normal. Negative for fracture or focal lesion. Sinuses: Minimal scattered paranasal sinus mucosal thickening. Orbits: No orbital mass or acute orbital finding. Review of the MIP images confirms the above findings CTA NECK FINDINGS Aortic arch: The left vertebral artery arises directly from the aortic arch. Atherosclerotic plaque within the visualized aortic arch and proximal major branch vessels of the neck. Streak and beam hardening  artifact arising from a dense left-sided contrast bolus partially obscures the left subclavian artery. Within this limitation, there is no appreciable hemodynamically significant innominate or proximal subclavian artery stenosis. Right carotid system: CCA and ICA patent within the neck without significant stenosis (50% or greater). Mild atherosclerotic plaque a within the  proximal ICA. Left carotid system: CCA and ICA patent within the neck without significant stenosis (50% or greater). Mild atherosclerotic plaque about the carotid bifurcation and within the proximal ICA. Vertebral arteries: Vertebral arteries patent within the neck without significant stenosis. Right vertebral artery dominant. Skeleton: Cervical spine separately reported on concurrently performed CT of the cervical spine. Elsewhere, no acute bony abnormality is identified. Other neck: No neck mass or cervical lymphadenopathy. Upper chest: No consolidation within the imaged lung apices. Centrilobular emphysema. Review of the MIP images confirms the above findings CTA HEAD FINDINGS Anterior circulation: The intracranial internal carotid arteries are patent. Nonstenotic calcified plaque within both vessels. The M1 middle cerebral arteries are patent. Atherosclerotic irregularity of the M2 and more distal MCA vessels, bilaterally. No M2 proximal branch occlusion or high-grade proximal stenosis is identified. The anterior cerebral arteries are patent. No intracranial aneurysm is identified. Posterior circulation: The non dominant left vertebral artery terminates predominantly as the left PICA. The dominant intracranial right vertebral artery is patent without stenosis. The basilar artery is patent. The posterior cerebral arteries are patent. Venous sinuses: Within the limitations of contrast timing, no convincing thrombus. Anatomic variants: Posterior communicating arteries are present bilaterally. Review of the MIP images confirms the above findings  IMPRESSION: CT head: 1. No evidence of acute intracranial abnormality. 2. 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 3. Mild generalized cerebral and cerebellar atrophy. CTA neck: 1. The common carotid, internal carotid and vertebral arteries are patent within the neck without hemodynamically significant stenosis. Mild atherosclerotic plaque within the bilateral carotid systems within the neck, as described. 2. Aortic Atherosclerosis (ICD10-I70.0) and Emphysema (ICD10-J43.9). CTA head: 1. No intracranial large vessel occlusion or proximal high-grade arterial stenosis identified. 2. Nonstenotic atherosclerotic plaque within the intracranial ICAs. 3. Atherosclerotic irregularity of the M2 and more distal MCA vessels, bilaterally. Electronically Signed   By: Kellie Simmering D.O.   On: 06/28/2021 13:25   DG Forearm Left  Result Date: 06/28/2021 CLINICAL DATA:  Status post fall, left forearm pain EXAM: LEFT FOREARM - 2 VIEW COMPARISON:  None. FINDINGS: No acute fracture or dislocation. No aggressive osseous lesion. Normal alignment. Soft tissue are unremarkable. No radiopaque foreign body or soft tissue emphysema. IMPRESSION: 1.  No acute osseous injury of the left forearm. Electronically Signed   By: Kathreen Devoid M.D.   On: 06/28/2021 12:52   CT Knee Left Wo Contrast  Result Date: 06/28/2021 CLINICAL DATA:  Evaluate for hemarthrosis. Bruising and swelling to LEFT knee. EXAM: CT OF THE  KNEE WITHOUT CONTRAST TECHNIQUE: Multidetector CT imaging of the knee was performed according to the standard protocol. Multiplanar CT image reconstructions were also generated. RADIATION DOSE REDUCTION: This exam was performed according to the departmental dose-optimization program which includes automated exposure control, adjustment of the mA and/or kV according to patient size and/or use of iterative reconstruction technique. COMPARISON:  None. FINDINGS: No osseous fracture line or displaced fracture fragment seen.  Tricompartmental degenerative osteoarthritic changes, with joint space narrowings and osseous spurring, most pronounced at the lateral compartment. Small joint effusion, mostly localized to the suprapatellar fossa, without layering density to suggest hemarthrosis. Pronounced masslike thickening within the subcutaneous soft tissues overlying the knee (series 4, images 55 through 98), measuring up to 2.5 cm thickness (series 4, image 67) most pronounced anteriorly and medially. This could represent a soft tissue mass or phlegmonous-like infectious collection. Additional ill-defined fluid stranding throughout the subcutaneous soft tissues about the knee and proximal tib-fib, suggesting underlying cellulitis. IMPRESSION: 1.  Pronounced masslike thickening within the subcutaneous soft tissues overlying the knee, measuring up to 2.5 cm thickness, most pronounced anteriorly and medially. This could represent a soft tissue mass or phlegmonous-like infectious collection. If any recent injury, this could represent soft tissue hematoma. 2. Additional ill-defined fluid stranding throughout the subcutaneous soft tissues about the knee and proximal tib-fib, suggesting underlying cellulitis. 3. Small joint effusion, mostly localized to the suprapatellar fossa, without layering density to suggest hemarthrosis. 4. Tricompartmental degenerative osteoarthritic changes, most pronounced at the lateral compartment. 5. No acute-appearing osseous abnormality. Electronically Signed   By: Franki Cabot M.D.   On: 06/28/2021 19:23   MR ANGIO HEAD WO CONTRAST  Result Date: 06/28/2021 CLINICAL DATA:  Mental status change, unknown cause. EXAM: MRI HEAD WITHOUT CONTRAST MRA HEAD WITHOUT CONTRAST MRA NECK WITHOUT CONTRAST TECHNIQUE: Multiplanar, multi-echo pulse sequences of the brain and surrounding structures were acquired without intravenous contrast. Angiographic images of the Circle of Willis were acquired using MRA technique without  intravenous contrast. Angiographic images of the neck were acquired using MRA technique without intravenous contrast. Carotid stenosis measurements (when applicable) are obtained utilizing NASCET criteria, using the distal internal carotid diameter as the denominator. COMPARISON:  Noncontrast head CT and CT angiogram head/neck performed earlier today 06/28/2021. FINDINGS: MRI HEAD FINDINGS Brain: Mild intermittent motion degradation. Mild generalized cerebral and cerebellar atrophy. There are fairly numerous scattered subcentimeter acute infarcts within the bilateral cerebral hemispheres, right basal ganglia, posterior limb of right internal capsule, left thalamus and within the bilateral cerebellar hemispheres. Involvement of multiple vascular territories raises suspicion for an embolic process. Background minimal multifocal T2 FLAIR hyperintense signal abnormality within the cerebral white matter, nonspecific but compatible with chronic small vessel ischemic disease. 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 There are a few nonspecific supratentorial chronic parenchymal microhemorrhages. No evidence of an intracranial mass. No extra-axial fluid collection. No midline shift. Vascular: Maintained flow voids within the proximal large arterial vessels. Skull and upper cervical spine: No focal suspicious marrow lesion. Sinuses/Orbits: Visualized orbits show no acute finding. Bilateral ocular lens replacements. Mild mucosal thickening within the left frontal sinus and within a posterior left ethmoid air cell. MRA HEAD FINDINGS Mildly motion degraded exam. Anterior circulation: The intracranial internal carotid arteries are patent. Minimal atherosclerotic irregularity of both vessels. The M1 middle cerebral arteries are patent. No M2 proximal branch occlusion or high-grade proximal stenosis is identified. Mild atherosclerotic irregularity of the M2 and more distal MCA vessels,  bilaterally. The anterior cerebral arteries are patent. No intracranial aneurysm is identified. Posterior circulation: The intracranial vertebral arteries are patent. The non dominant left vertebral artery is developmentally diminutive beyond the origin of the left PICA. The basilar artery is patent. The posterior cerebral arteries are patent. Posterior communicating arteries are present bilaterally. Anatomic variants: None significant. MRA NECK FINDINGS Aortic arch: Motion degradation and noncontrast technique limits evaluation of the aortic arch and origins of the great vessels of the neck. Right carotid system: Within described limitations, the CCA and ICA are patent within the neck without hemodynamically significant stenosis (50% or greater). Mild atherosclerotic plaque within the proximal ICA. Left carotid system: Within described limitations, the CCA and ICA are patent within the neck without hemodynamically significant stenosis. Mild atherosclerotic plaque within the carotid bifurcation and proximal ICA. Vertebral arteries: Within described limitations, the vertebral arteries are patent within the neck without hemodynamically significant stenosis. The right vertebral artery is dominant. IMPRESSION: MRI brain: 1. Mildly motion degraded exam. 2. Fairly  numerous subcentimeter acute infarcts within the bilateral cerebral hemispheres, right basal ganglia, posterior limb of right internal capsule, left thalamus and within the bilateral cerebellar hemispheres. Involvement of multiple vascular territories raises suspicion for an embolic process. 3. Background minimal chronic small-vessel ischemic changes within the cerebral white matter. 4. 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 5. Mild generalized parenchymal atrophy. 6. Mild paranasal sinus disease, as described. MRA head: 1. No intracranial large vessel occlusion or proximal high-grade arterial stenosis. 2. Intracranial  atherosclerotic disease, as described. MRA neck: 1. Motion degradation and non-contrast technique limits evaluation of the aortic arch and proximal major branch vessels of the neck. 2. Within this limitation, the common carotid, internal carotid and vertebral arteries are patent within neck without hemodynamically significant stenosis. Mild atherosclerotic plaque within the bilateral carotid systems within the neck, as described. Electronically Signed   By: Kellie Simmering D.O.   On: 06/28/2021 18:07   MR ANGIO NECK WO CONTRAST  Result Date: 06/28/2021 CLINICAL DATA:  Mental status change, unknown cause. EXAM: MRI HEAD WITHOUT CONTRAST MRA HEAD WITHOUT CONTRAST MRA NECK WITHOUT CONTRAST TECHNIQUE: Multiplanar, multi-echo pulse sequences of the brain and surrounding structures were acquired without intravenous contrast. Angiographic images of the Circle of Willis were acquired using MRA technique without intravenous contrast. Angiographic images of the neck were acquired using MRA technique without intravenous contrast. Carotid stenosis measurements (when applicable) are obtained utilizing NASCET criteria, using the distal internal carotid diameter as the denominator. COMPARISON:  Noncontrast head CT and CT angiogram head/neck performed earlier today 06/28/2021. FINDINGS: MRI HEAD FINDINGS Brain: Mild intermittent motion degradation. Mild generalized cerebral and cerebellar atrophy. There are fairly numerous scattered subcentimeter acute infarcts within the bilateral cerebral hemispheres, right basal ganglia, posterior limb of right internal capsule, left thalamus and within the bilateral cerebellar hemispheres. Involvement of multiple vascular territories raises suspicion for an embolic process. Background minimal multifocal T2 FLAIR hyperintense signal abnormality within the cerebral white matter, nonspecific but compatible with chronic small vessel ischemic disease. Chronic lacunar infarcts within the right (and  possibly left) caudate nuclei. There are a few nonspecific supratentorial chronic parenchymal microhemorrhages. No evidence of an intracranial mass. No extra-axial fluid collection. No midline shift. Vascular: Maintained flow voids within the proximal large arterial vessels. Skull and upper cervical spine: No focal suspicious marrow lesion. Sinuses/Orbits: Visualized orbits show no acute finding. Bilateral ocular lens replacements. Mild mucosal thickening within the left frontal sinus and within a posterior left ethmoid air cell. MRA HEAD FINDINGS Mildly motion degraded exam. Anterior circulation: The intracranial internal carotid arteries are patent. Minimal atherosclerotic irregularity of both vessels. The M1 middle cerebral arteries are patent. No M2 proximal branch occlusion or high-grade proximal stenosis is identified. Mild atherosclerotic irregularity of the M2 and more distal MCA vessels, bilaterally. The anterior cerebral arteries are patent. No intracranial aneurysm is identified. Posterior circulation: The intracranial vertebral arteries are patent. The non dominant left vertebral artery is developmentally diminutive beyond the origin of the left PICA. The basilar artery is patent. The posterior cerebral arteries are patent. Posterior communicating arteries are present bilaterally. Anatomic variants: None significant. MRA NECK FINDINGS Aortic arch: Motion degradation and noncontrast technique limits evaluation of the aortic arch and origins of the great vessels of the neck. Right carotid system: Within described limitations, the CCA and ICA are patent within the neck without hemodynamically significant stenosis (50% or greater). Mild atherosclerotic plaque within the proximal ICA. Left carotid system: Within described limitations, the CCA and  ICA are patent within the neck without hemodynamically significant stenosis. Mild atherosclerotic plaque within the carotid bifurcation and proximal ICA. Vertebral  arteries: Within described limitations, the vertebral arteries are patent within the neck without hemodynamically significant stenosis. The right vertebral artery is dominant. IMPRESSION: MRI brain: 1. Mildly motion degraded exam. 2. Fairly numerous subcentimeter acute infarcts within the bilateral cerebral hemispheres, right basal ganglia, posterior limb of right internal capsule, left thalamus and within the bilateral cerebellar hemispheres. Involvement of multiple vascular territories raises suspicion for an embolic process. 3. Background minimal chronic small-vessel ischemic changes within the cerebral white matter. 4. 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 5. Mild generalized parenchymal atrophy. 6. Mild paranasal sinus disease, as described. MRA head: 1. No intracranial large vessel occlusion or proximal high-grade arterial stenosis. 2. Intracranial atherosclerotic disease, as described. MRA neck: 1. Motion degradation and non-contrast technique limits evaluation of the aortic arch and proximal major branch vessels of the neck. 2. Within this limitation, the common carotid, internal carotid and vertebral arteries are patent within neck without hemodynamically significant stenosis. Mild atherosclerotic plaque within the bilateral carotid systems within the neck, as described. Electronically Signed   By: Kellie Simmering D.O.   On: 06/28/2021 18:07   MR BRAIN WO CONTRAST  Result Date: 06/28/2021 CLINICAL DATA:  Mental status change, unknown cause. EXAM: MRI HEAD WITHOUT CONTRAST MRA HEAD WITHOUT CONTRAST MRA NECK WITHOUT CONTRAST TECHNIQUE: Multiplanar, multi-echo pulse sequences of the brain and surrounding structures were acquired without intravenous contrast. Angiographic images of the Circle of Willis were acquired using MRA technique without intravenous contrast. Angiographic images of the neck were acquired using MRA technique without intravenous contrast. Carotid  stenosis measurements (when applicable) are obtained utilizing NASCET criteria, using the distal internal carotid diameter as the denominator. COMPARISON:  Noncontrast head CT and CT angiogram head/neck performed earlier today 06/28/2021. FINDINGS: MRI HEAD FINDINGS Brain: Mild intermittent motion degradation. Mild generalized cerebral and cerebellar atrophy. There are fairly numerous scattered subcentimeter acute infarcts within the bilateral cerebral hemispheres, right basal ganglia, posterior limb of right internal capsule, left thalamus and within the bilateral cerebellar hemispheres. Involvement of multiple vascular territories raises suspicion for an embolic process. Background minimal multifocal T2 FLAIR hyperintense signal abnormality within the cerebral white matter, nonspecific but compatible with chronic small vessel ischemic disease. 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 There are a few nonspecific supratentorial chronic parenchymal microhemorrhages. No evidence of an intracranial mass. No extra-axial fluid collection. No midline shift. Vascular: Maintained flow voids within the proximal large arterial vessels. Skull and upper cervical spine: No focal suspicious marrow lesion. Sinuses/Orbits: Visualized orbits show no acute finding. Bilateral ocular lens replacements. Mild mucosal thickening within the left frontal sinus and within a posterior left ethmoid air cell. MRA HEAD FINDINGS Mildly motion degraded exam. Anterior circulation: The intracranial internal carotid arteries are patent. Minimal atherosclerotic irregularity of both vessels. The M1 middle cerebral arteries are patent. No M2 proximal branch occlusion or high-grade proximal stenosis is identified. Mild atherosclerotic irregularity of the M2 and more distal MCA vessels, bilaterally. The anterior cerebral arteries are patent. No intracranial aneurysm is identified. Posterior circulation: The  intracranial vertebral arteries are patent. The non dominant left vertebral artery is developmentally diminutive beyond the origin of the left PICA. The basilar artery is patent. The posterior cerebral arteries are patent. Posterior communicating arteries are present bilaterally. Anatomic variants: None significant. MRA NECK FINDINGS Aortic arch: Motion degradation and noncontrast technique  limits evaluation of the aortic arch and origins of the great vessels of the neck. Right carotid system: Within described limitations, the CCA and ICA are patent within the neck without hemodynamically significant stenosis (50% or greater). Mild atherosclerotic plaque within the proximal ICA. Left carotid system: Within described limitations, the CCA and ICA are patent within the neck without hemodynamically significant stenosis. Mild atherosclerotic plaque within the carotid bifurcation and proximal ICA. Vertebral arteries: Within described limitations, the vertebral arteries are patent within the neck without hemodynamically significant stenosis. The right vertebral artery is dominant. IMPRESSION: MRI brain: 1. Mildly motion degraded exam. 2. Fairly numerous subcentimeter acute infarcts within the bilateral cerebral hemispheres, right basal ganglia, posterior limb of right internal capsule, left thalamus and within the bilateral cerebellar hemispheres. Involvement of multiple vascular territories raises suspicion for an embolic process. 3. Background minimal chronic small-vessel ischemic changes within the cerebral white matter. 4. 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 5. Mild generalized parenchymal atrophy. 6. Mild paranasal sinus disease, as described. MRA head: 1. No intracranial large vessel occlusion or proximal high-grade arterial stenosis. 2. Intracranial atherosclerotic disease, as described. MRA neck: 1. Motion degradation and non-contrast technique limits evaluation of the  aortic arch and proximal major branch vessels of the neck. 2. Within this limitation, the common carotid, internal carotid and vertebral arteries are patent within neck without hemodynamically significant stenosis. Mild atherosclerotic plaque within the bilateral carotid systems within the neck, as described. Electronically Signed   By: Kellie Simmering D.O.   On: 06/28/2021 18:07   CT C-SPINE NO CHARGE  Result Date: 06/28/2021 CLINICAL DATA:  81 year old female with fall. EXAM: CT CERVICAL SPINE WITHOUT CONTRAST TECHNIQUE: Multidetector CT imaging of the cervical spine was performed without intravenous contrast. Multiplanar CT image reconstructions were also generated. RADIATION DOSE REDUCTION: This exam was performed according to the departmental dose-optimization program which includes automated exposure control, adjustment of the mA and/or kV according to patient size and/or use of iterative reconstruction technique. COMPARISON:  None. FINDINGS: Alignment: Normal. Skull base and vertebrae: No acute fracture. No primary bone lesion or focal pathologic process. Soft tissues and spinal canal: No prevertebral fluid or swelling. No visible canal hematoma. Disc levels: Mild multilevel degenerative disc disease/spondylosis and mild to moderate multilevel facet arthropathy noted. These findings contribute to mild bony RIGHT foraminal narrowing at C3-4. Upper chest: No acute abnormality Other: None IMPRESSION: 1. No static evidence of acute injury to the cervical spine. 2. Mild multilevel degenerative disc disease/spondylosis and mild to moderate multilevel facet arthropathy. CTA head/neck is dictated in a separate report. Electronically Signed   By: Margarette Canada M.D.   On: 06/28/2021 13:25   DG Chest Portable 1 View  Result Date: 06/28/2021 CLINICAL DATA:  Fall EXAM: PORTABLE CHEST 1 VIEW COMPARISON:  None. FINDINGS: Lungs are clear. No pneumothorax or pleural effusion. Cardiac size is mildly enlarged. Prominent  calcification of the mitral valve annulus noted. Rounded opacity within the right perihilar region represents vascular shadow, better assessed on prior CT examination of 07/28/2006. Pulmonary vascularity is otherwise normal. No acute bone abnormality. IMPRESSION: No radiographic evidence of acute cardiopulmonary disease. Mild cardiomegaly Electronically Signed   By: Fidela Salisbury M.D.   On: 06/28/2021 19:41   DG Knee Complete 4 Views Left  Result Date: 06/28/2021 CLINICAL DATA:  Status post fall.  Left arm pain.  Left knee pain. EXAM: LEFT KNEE - COMPLETE 4+ VIEW COMPARISON:  None. FINDINGS: No acute fracture or dislocation. No  aggressive osseous lesion. Normal alignment. Generalized osteopenia. Moderate left lateral femorotibial compartment joint space narrowing with marginal osteophytes. Mild medial femorotibial compartment joint space narrowing marginal osteophytes. Severe soft tissue swelling along the anterior aspect of the knee. No radiopaque foreign body or soft tissue emphysema. IMPRESSION: 1. No acute osseous injury of the left knee. 2. Severe soft tissue swelling along the anterior aspect of the knee consistent with soft tissue contusion. Electronically Signed   By: Kathreen Devoid M.D.   On: 06/28/2021 12:53    Procedures .Critical Care Performed by: Regan Lemming, MD Authorized by: Regan Lemming, MD   Critical care provider statement:    Critical care time (minutes):  35   Critical care was necessary to treat or prevent imminent or life-threatening deterioration of the following conditions:  CNS failure or compromise   Critical care was time spent personally by me on the following activities:  Discussions with consultants, development of treatment plan with patient or surrogate, obtaining history from patient or surrogate, ordering and performing treatments and interventions, ordering and review of laboratory studies, ordering and review of radiographic studies, pulse oximetry, re-evaluation  of patient's condition and review of old charts   Care discussed with: admitting provider      Medications Ordered in ED Medications  rivaroxaban (XARELTO) tablet 20 mg (has no administration in time range)  iohexol (OMNIPAQUE) 350 MG/ML injection 75 mL (75 mLs Intravenous Contrast Given 06/28/21 1308)    ED Course/ Medical Decision Making/ A&P                           Medical Decision Making Amount and/or Complexity of Data Reviewed Labs: ordered. Radiology: ordered.  Risk Prescription drug management. Decision regarding hospitalization.   81 year old female presenting to the emergency department with concern for stroke.  The history is provided by the patient in addition to EMS.  The patient presents with strokelike symptoms.  She was last known normal at 2100 when she went to bed.  This morning at 0 300 she woke up and felt due to weakness in her legs.  She described "my legs giving out" and she subsequently fell to the ground landing on her left elbow and left knee.  She sustained acute skin tear to the left elbow.  She states that her tetanus is up-to-date.  She also sustained a hematoma to the left knee.  She is on Xarelto.  She denies any head trauma or loss of consciousness.  At that same time, she felt lightheaded and felt left arm weakness after the fall.  Her son arrived to the house around 0 600 at 0 700 and she was noted to have slurred speech but no other abnormalities.  Blood glucose normal with EMS.  On arrival, the patient was afebrile, hemodynamically stable, with slight left upper extremity 4 out of 5 weakness on exam.  She presents with concern for possible stroke given persistence of symptoms with a last known normal at 2100.  Work-up was initiated to include screening labs and CT imaging.  The patient underwent urinalysis without evidence of UTI, UDS normal, COVID-19 and influenza PCR normal, Chem-8 without significant abnormalities, ethanol level normal, CMP  unremarkable, CBC unremarkable.  The patient underwent trauma imaging of her C-spine, knee and left forearm without acute fracture or malalignment with no other acute abnormalities noted.  Her tetanus is up-to-date.  I independently interpreted the patient's x-rays and agree with no acute fracture on imaging.  Soft tissue contusion along the medial aspect of the left knee is concerning for hematoma formation in the setting of the patient's Xarelto use.  CT of the left knee was performed to evaluate for traumatic hemarthrosis.  The patient was found to have a soft tissue hematoma, 2.5 cm in thickness most anteriorly and medially consistent with soft tissue hematoma.  Small joint effusion was present, mostly localized to the suprapatellar fossa without layering density to suggest hemarthrosis.  CTA head, neck, CT head did not reveal acute abnormalities.  MRI brain, MRA head and neck was ordered per neurology after neurology consultation.  I did speak with Dr. Theda Sers who recommended MRI imaging.  Concern for stroke.  325 mg of aspirin was ordered.  MRI imaging was concerning for fairly numerous subcentimeter acute infarcts within the bilateral cerebral hemispheres, right basal ganglia, posterior limb of the right internal capsule, left somnolescent within the bilateral cerebellar hemispheres with involvement of multiple vascular territories which raises suspicion for an embolic process.  MRA imaging did not reveal any acute vascular abnormality.  Following work-up, neurology recommended admission for further stroke evaluation.  Hospitalist medicine was consulted for admission and Dr. Roel Cluck accepted the patient in admission.  Final Clinical Impression(s) / ED Diagnoses Final diagnoses:  Weakness  Cerebrovascular accident (CVA), unspecified mechanism ([place])  Hematoma of left knee region  Skin tear of left elbow without complication, initial encounter    Rx / DC Orders ED Discharge Orders     None          Regan Lemming, MD 06/28/21 1821    Regan Lemming, MD 06/28/21 2155

## 2021-06-28 NOTE — ED Notes (Signed)
Pt stretcher searched again and inhaler found

## 2021-06-28 NOTE — Assessment & Plan Note (Signed)
Chronic stable, avoid fluid overload

## 2021-06-28 NOTE — Assessment & Plan Note (Signed)
Chronic on Femara will continue followed by Dr. Lindi Adie

## 2021-06-28 NOTE — Assessment & Plan Note (Addendum)
-   will admit based on TIA/CVA protocol,        Monitor on Tele       MRA/MRI  Resulted -showing multiple embolic acute ischemic CVAs     MRA neck no significant obstruction       Echo to evaluate for possible embolic source,        obtain cardiac enzymes,  ECG,   Lipid panel, TSH.        Order PT/OT evaluation.        keep nothing by mouth until passes swallow eval        Will make sure patient is on Xarelto if no evidence of hemarthrosis in the knee. CT scan neg for hemarthrosis Will restart xarelto       Allow permissive Hypertension keep BP <180 /120        Neurology consulted Have seen pt in ER

## 2021-06-28 NOTE — ED Triage Notes (Signed)
Pt arrived to ED via EMS w/ c/o stroke-like symptoms. LKW 2100 yesterday, woke up at 0300 and fell reporting her legs gave out. Pt has skin tear to L FA and swelling, bruising and pain to L knee. Pt on xarelto and did not hit her head. Pt reports L arm felt weak at 0300 after the fall. Upon son's arrival around 0600-0700 it was noted that pt had slurred speech. VSS w/ EMS

## 2021-06-29 ENCOUNTER — Inpatient Hospital Stay (HOSPITAL_COMMUNITY): Payer: Medicare Other

## 2021-06-29 DIAGNOSIS — R21 Rash and other nonspecific skin eruption: Secondary | ICD-10-CM

## 2021-06-29 DIAGNOSIS — I6389 Other cerebral infarction: Secondary | ICD-10-CM

## 2021-06-29 LAB — ECHOCARDIOGRAM COMPLETE
Area-P 1/2: 1.82 cm2
Height: 71 in
MV VTI: 2.1 cm2
S' Lateral: 3.1 cm
Weight: 3120 oz

## 2021-06-29 LAB — HEPATIC FUNCTION PANEL
ALT: 15 U/L (ref 0–44)
AST: 30 U/L (ref 15–41)
Albumin: 3.2 g/dL — ABNORMAL LOW (ref 3.5–5.0)
Alkaline Phosphatase: 56 U/L (ref 38–126)
Bilirubin, Direct: <0.1 mg/dL (ref 0.0–0.2)
Total Bilirubin: 0.7 mg/dL (ref 0.3–1.2)
Total Protein: 6.5 g/dL (ref 6.5–8.1)

## 2021-06-29 LAB — MAGNESIUM: Magnesium: 1.8 mg/dL (ref 1.7–2.4)

## 2021-06-29 LAB — LIPID PANEL
Cholesterol: 158 mg/dL (ref 0–200)
HDL: 29 mg/dL — ABNORMAL LOW (ref >40)
LDL Cholesterol: 109 mg/dL — ABNORMAL HIGH (ref 0–99)
Total CHOL/HDL Ratio: 5.4 RATIO
Triglycerides: 102 mg/dL (ref <150)
VLDL: 20 mg/dL (ref 0–40)

## 2021-06-29 LAB — HEMOGLOBIN A1C
Hgb A1c MFr Bld: 5.1 % (ref 4.8–5.6)
Mean Plasma Glucose: 99.67 mg/dL

## 2021-06-29 LAB — CK: Total CK: 236 U/L — ABNORMAL HIGH (ref 38–234)

## 2021-06-29 LAB — PHOSPHORUS: Phosphorus: 3.3 mg/dL (ref 2.5–4.6)

## 2021-06-29 MED ORDER — SENNOSIDES-DOCUSATE SODIUM 8.6-50 MG PO TABS: 1.0000 | ORAL_TABLET | Freq: Every evening | ORAL | Status: DC | PRN

## 2021-06-29 MED ORDER — ALBUTEROL SULFATE (2.5 MG/3ML) 0.083% IN NEBU: 2.5000 mg | INHALATION_SOLUTION | RESPIRATORY_TRACT | Status: DC | PRN

## 2021-06-29 MED ORDER — APIXABAN 5 MG PO TABS
5.0000 mg | ORAL_TABLET | Freq: Two times a day (BID) | ORAL | Status: DC
  Administered 2021-06-29 – 2021-07-01 (×5): 5 mg via ORAL
  Filled 2021-06-29 (×5): qty 1

## 2021-06-29 MED ORDER — ACETAMINOPHEN 650 MG RE SUPP: 650.0000 mg | RECTAL | Status: DC | PRN

## 2021-06-29 MED ORDER — ALBUTEROL SULFATE HFA 108 (90 BASE) MCG/ACT IN AERS: 2.0000 | INHALATION_SPRAY | RESPIRATORY_TRACT | Status: DC | PRN

## 2021-06-29 MED ORDER — STROKE: EARLY STAGES OF RECOVERY BOOK
Freq: Once | Status: AC
  Administered 2021-06-29: 1
  Filled 2021-06-29: qty 1

## 2021-06-29 MED ORDER — LETROZOLE 2.5 MG PO TABS
2.5000 mg | ORAL_TABLET | Freq: Every day | ORAL | Status: DC
  Administered 2021-06-29 – 2021-06-30 (×2): 2.5 mg via ORAL
  Filled 2021-06-29 (×4): qty 1

## 2021-06-29 MED ORDER — CALCIUM CARBONATE ANTACID 500 MG PO CHEW
1.0000 | CHEWABLE_TABLET | Freq: Three times a day (TID) | ORAL | Status: DC | PRN
  Administered 2021-06-29: 200 mg via ORAL
  Filled 2021-06-29: qty 1

## 2021-06-29 MED ORDER — ACETAMINOPHEN 325 MG PO TABS
650.0000 mg | ORAL_TABLET | ORAL | Status: DC | PRN
  Administered 2021-06-29 – 2021-06-30 (×2): 650 mg via ORAL
  Filled 2021-06-29 (×2): qty 2

## 2021-06-29 MED ORDER — PANTOPRAZOLE SODIUM 40 MG PO TBEC
40.0000 mg | DELAYED_RELEASE_TABLET | Freq: Every day | ORAL | Status: DC
  Administered 2021-06-29 – 2021-07-01 (×3): 40 mg via ORAL
  Filled 2021-06-29 (×3): qty 1

## 2021-06-29 MED ORDER — ACETAMINOPHEN 160 MG/5ML PO SOLN: 650.0000 mg | ORAL | Status: DC | PRN

## 2021-06-29 MED ORDER — SODIUM CHLORIDE 0.9 % IV SOLN: INTRAVENOUS | Status: DC

## 2021-06-29 MED ORDER — ATORVASTATIN CALCIUM 10 MG PO TABS
20.0000 mg | ORAL_TABLET | Freq: Every day | ORAL | Status: DC
  Administered 2021-06-29 – 2021-07-01 (×3): 20 mg via ORAL
  Filled 2021-06-29 (×3): qty 2

## 2021-06-29 NOTE — Assessment & Plan Note (Addendum)
Multiple vascular territories bilaterally with predominance of symptoms on left suggestive that most symptomatic infarcts are from right PLIC. Strong suspicion for embolic phenomenon despite adherence to xarelto. No CES on echo.  - Continue anticoagulation, swtiched xarelto to eliquis.   - Started atorvastatin for LDL 109 (goal < 70). HbA1c wnl. - PT/OT/SLP recommend CIR, pending insurance authorization

## 2021-06-29 NOTE — Assessment & Plan Note (Addendum)
Euvolemic. 

## 2021-06-29 NOTE — Evaluation (Signed)
Occupational Therapy Evaluation Patient Details Name: Laura Mendez MRN: [id number] DOB: 02/23/1941 Today's Date: 06/29/2021   History of Present Illness Pt is a 81 y/o female presenting on 2/5 after fall due to L sided weakness, speech difficulty. 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 PMH includes: TIA, asthma, afib, breast CA.   Clinical Impression   PTA patient independent and driving. Admitted for above and is limited by problem list below, including L UE weakness and decreased coordination, impaired balance, L knee pain, decreased activity tolerance and endurance.  Patient currently mod assist +2 for transfers, mod assist for UB ADLs and up to total assist +2 for LB ADLs.  Patient highly motivated and demonstrates good awareness to deficits, but decreased problem solving and sequencing.  Family reports plan to return home with support. She will benefit from continued OT services acutely and after dc at [facility] to optimize independence and return to PLOF. Will follow.      Recommendations for follow up therapy are one component of a multi-disciplinary discharge planning process, led by the attending physician.  Recommendations may be updated based on patient status, additional functional criteria and insurance authorization.   Follow Up Recommendations  Acute inpatient rehab (3hours/day)    Assistance Recommended at Discharge Frequent or constant Supervision/Assistance  Patient can return home with the following Two people to help with walking and/or transfers;A lot of help with bathing/dressing/bathroom;Assistance with cooking/housework;Direct supervision/assist for medications management;Direct supervision/assist for financial management;Assist for transportation;Help with stairs or ramp for entrance    Functional Status Assessment  Patient  has had a recent decline in their functional status and demonstrates the ability to make significant improvements in function in a reasonable and predictable amount of time.  Equipment Recommendations  Other (comment) (TBD)    Recommendations for Other Services       Precautions / Restrictions Precautions Precautions: Fall Restrictions Weight Bearing Restrictions: No      Mobility Bed Mobility Overal bed mobility: Needs Assistance Bed Mobility: Supine to Sit, Sit to Supine     Supine to sit: Min assist, +2 for safety/equipment Sit to supine: Max assist, +2 for safety/equipment   General bed mobility comments: Required min A for trunk assist and assist to scoot to EOB. Required max A +2 for LE land trunk assist to return to supine.    Transfers                          Balance Overall balance assessment: Needs assistance Sitting-balance support: No upper extremity supported, Feet supported Sitting balance-Leahy Scale: Fair     Standing balance support: Bilateral upper extremity supported, During functional activity Standing balance-Leahy Scale: Poor Standing balance comment: Reliant on BUE and external support                           ADL either performed or assessed with clinical judgement   ADL Overall ADL's : Needs assistance/impaired     Grooming: Moderate assistance;Sitting           Upper Body Dressing : Moderate assistance;Sitting   Lower Body Dressing: Total assistance;+2 for physical assistance;+2 for safety/equipment;Sit to/from Health and safety inspector Details (indicate cue type and reason): unable, mod assist +2 sit to stand         Functional mobility during  ADLs: Moderate assistance;+2 for physical assistance;+2 for safety/equipment       Vision   Vision Assessment?: No apparent visual deficits Additional Comments: continue assessment     Perception     Praxis      Pertinent Vitals/Pain Pain  Assessment Pain Assessment: No/denies pain     Hand Dominance Right   Extremity/Trunk Assessment Upper Extremity Assessment Upper Extremity Assessment: Defer to OT evaluation LUE Deficits / Details: decreased strength (3-/5MMT), coordination LUE Sensation: decreased proprioception LUE Coordination: decreased fine motor;decreased gross motor   Lower Extremity Assessment Lower Extremity Assessment: LLE deficits/detail;Generalized weakness LLE Deficits / Details: At least 3/5 throughout. Pt unable to have formal MMT as pt with L knee contusion and increased pain.   Cervical / Trunk Assessment Cervical / Trunk Assessment: Kyphotic   Communication Communication Communication: No difficulties   Cognition Arousal/Alertness: Awake/alert Behavior During Therapy: WFL for tasks assessed/performed Overall Cognitive Status: Impaired/Different from baseline Area of Impairment: Problem solving, Safety/judgement                         Safety/Judgement: Decreased awareness of safety   Problem Solving: Slow processing, Requires verbal cues, Difficulty sequencing General Comments: increaed time to process and sequence, fair awareness to deficits and safety     General Comments  Pt's son present during session    Exercises     Shoulder Instructions      Home Living Family/patient expects to be discharged to:: Private residence Living Arrangements: Alone Available Help at Discharge: Family;Available 24 hours/day Type of Home: Other(Comment) (townhouse) Home Access: Stairs to enter [employer] of Steps: 2 (2 curb steps) Entrance Stairs-Rails: Right Home Layout: Two level Alternate Level Stairs-Number of Steps: flight Alternate Level Stairs-Rails: Left;Right Bathroom Shower/Tub: Walk-in shower;Tub/shower unit   [employer]: Standard     Home Equipment: Grab bars - tub/shower;Shower seat;Cane - single [employer]ista (2 wheels)          Prior  Functioning/Environment Prior Level of Function : Independent/Modified Independent;Driving                        OT Problem List: Decreased strength;Decreased range of motion;Decreased activity tolerance;Impaired balance (sitting and/or standing);Impaired vision/perception;Decreased coordination;Decreased cognition;Decreased safety awareness;Decreased knowledge of use of DME or AE;Decreased knowledge of precautions;Impaired UE functional use;Pain      OT Treatment/Interventions: Self-care/ADL training;Neuromuscular education;DME and/or AE instruction;Energy conservation;Therapeutic activities;Cognitive remediation/compensation;Patient/family education;Balance training;Visual/perceptual remediation/compensation    OT Goals(Current goals can be found in the care plan section) Acute Rehab OT Goals Patient Stated Goal: get better OT Goal Formulation: With patient Time For Goal Achievement: 07/13/21 Potential to Achieve Goals: Good  OT Frequency: Min 2X/week    Co-evaluation PT/OT/SLP Co-Evaluation/Treatment: Yes Reason for Co-Treatment: Complexity of the patient's impairments (multi-system involvement);For patient/therapist safety;To address functional/ADL transfers PT goals addressed during session: Mobility/safety with mobility;Balance OT goals addressed during session: ADL's and self-care      AM-PAC OT "6 Clicks" Daily Activity     Outcome Measure Help from another person eating meals?: A Little Help from another person taking care of personal grooming?: A Lot Help from another person toileting, which includes using toliet, bedpan, or urinal?: Total Help from another person bathing (including washing, rinsing, drying)?: A Lot Help from another person to put on and taking off regular upper body clothing?: A Lot Help from another person to put on and taking off regular lower body clothing?: Total 6 Click Score: 11  End of Session Equipment Utilized During Treatment: Gait  belt Nurse Communication: Mobility status  Activity Tolerance: Patient tolerated treatment well Patient left: in bed;with call bell/phone within reach;with family/visitor present  OT Visit Diagnosis: Other abnormalities of gait and mobility (R26.89);Muscle weakness (generalized) (M62.81);Hemiplegia and hemiparesis Hemiplegia - Right/Left: Left Hemiplegia - dominant/non-dominant: Non-Dominant Hemiplegia - caused by: Cerebral infarction                Time: 2336-1224 OT Time Calculation (min): 19 min Charges:  OT General Charges $OT Visit: 1 Visit OT Evaluation $OT Eval Moderate Complexity: 1 Mod  Jolaine Artist, OT Acute Rehabilitation Services Pager [phone] Office [phone]   Delight Stare 06/29/2021, 12:54 PM

## 2021-06-29 NOTE — ED Notes (Signed)
Pt noted to be resting comfortably.  NAD noted.

## 2021-06-29 NOTE — Progress Notes (Signed)
°  Echocardiogram 2D Echocardiogram has been performed.  Darlina Sicilian M 06/29/2021, 3:19 PM

## 2021-06-29 NOTE — Discharge Instructions (Signed)
Information on my medicine - ELIQUIS® (apixaban) ° °Why was Eliquis® prescribed for you? °Eliquis® was prescribed for you to reduce the risk of forming blood clots that can cause a stroke if you have a medical condition called atrial fibrillation (a type of irregular heartbeat) OR to reduce the risk of a blood clots forming after orthopedic surgery. ° °What do You need to know about Eliquis® ? °Take your Eliquis® TWICE DAILY - one tablet in the morning and one tablet in the evening with or without food.  It would be best to take the doses about the same time each day. ° °If you have difficulty swallowing the tablet whole please discuss with your pharmacist how to take the medication safely. ° °Take Eliquis® exactly as prescribed by your doctor and DO NOT stop taking Eliquis® without talking to the doctor who prescribed the medication.  Stopping may increase your risk of developing a new clot or stroke.  Refill your prescription before you run out. ° °After discharge, you should have regular check-up appointments with your healthcare provider that is prescribing your Eliquis®.  In the future your dose may need to be changed if your kidney function or weight changes by a significant amount or as you get older. ° °What do you do if you miss a dose? °If you miss a dose, take it as soon as you remember on the same day and resume taking twice daily.  Do not take more than one dose of ELIQUIS at the same time. ° °Important Safety Information °A possible side effect of Eliquis® is bleeding. You should call your healthcare provider right away if you experience any of the following: °Bleeding from an injury or your nose that does not stop. °Unusual colored urine (red or dark brown) or unusual colored stools (red or black). °Unusual bruising for unknown reasons. °A serious fall or if you hit your head (even if there is no bleeding). ° °Some medicines may interact with Eliquis® and might increase your risk of bleeding or  clotting while on Eliquis®. To help avoid this, consult your healthcare provider or pharmacist prior to using any new prescription or non-prescription medications, including herbals, vitamins, non-steroidal anti-inflammatory drugs (NSAIDs) and supplements. ° °This website has more information on Eliquis® (apixaban): http://www.eliquis.com/eliquis/home °  °

## 2021-06-29 NOTE — Assessment & Plan Note (Addendum)
Sinus arrhythmia at time of admission.  - Continue anticoagulation due to high CHA2DS2-VASc score.

## 2021-06-29 NOTE — ED Notes (Signed)
PT at bedside.

## 2021-06-29 NOTE — Progress Notes (Signed)
Inpatient Rehab Admissions Coordinator:  ? ?Per therapy recommendations,  patient was screened for CIR candidacy by Myanna Ziesmer, MS, CCC-SLP. At this time, Pt. Appears to be a a potential candidate for CIR. I will place   order for rehab consult per protocol for full assessment. Please contact me any with questions. ? ?Tene Gato, MS, CCC-SLP ?Rehab Admissions Coordinator  ?[phone] (celll) ?[phone] (office) ? ?

## 2021-06-29 NOTE — Assessment & Plan Note (Signed)
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 On dupixent with improvement.

## 2021-06-29 NOTE — Assessment & Plan Note (Signed)
Rather severe, followed by pulmonology, FVC 60%, FEV1 34%. Quiescent currently. Nonacute CXR.  - Continue inhaled treatments

## 2021-06-29 NOTE — Progress Notes (Addendum)
STROKE TEAM PROGRESS NOTE   SUBJECTIVE (INTERVAL HISTORY) Her son is at the bedside.  Overall her condition is gradually improving. Patient reports that she had the acute onset of L sided weakness and fell. She was able to call her son who activated EMS. This morning, she reports some residual weakness but notes improvements. She also notes some mild dysarthria. She reports that she takes Xarelto daily for Afib and has been compliant with her medication regimen.   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 CTA with mild atherosclerotic plaques of bilateral Carotids at the level of the neck and intracranially. MRI with many acute infarcts of bilateral cerebral hemispheres, R basal ganglia, post limb of R internal capsule, L thalamus, and bilateral cerebellar hemispheres, as well as chronic small vessel ischemic changes of cerebral white matter and chronic lacunar infarcts of bilateral caudate nuclei.    OBJECTIVE CBC:  Recent Labs  Lab 06/28/21 1126 06/28/21 1214  WBC 9.2  --   NEUTROABS 6.8  --   HGB 13.5 14.3  HCT 42.0 42.0  MCV 92.3  --   PLT 285  --    Basic Metabolic Panel:  Recent Labs  Lab 06/28/21 1126 06/28/21 1214 06/29/21 0230  NA 140 141  --   K 3.6 3.6  --   CL 105 102  --   CO2 24  --   --   GLUCOSE 113* 113*  --   BUN 6* 8  --   CREATININE 0.87 0.80  --   CALCIUM 9.3  --   --   MG  --   --  1.8  PHOS  --   --  3.3   Lipid Panel:  Recent Labs  Lab 06/29/21 0230  CHOL 158  TRIG 102  HDL 29*  CHOLHDL 5.4  VLDL 20  LDLCALC 109*   HgbA1c:  Recent Labs  Lab 06/29/21 0229  HGBA1C 5.1   Urine Drug Screen:  Recent Labs  Lab 06/28/21 1533  LABOPIA NONE DETECTED  COCAINSCRNUR NONE DETECTED  LABBENZ NONE DETECTED  AMPHETMU NONE DETECTED  THCU NONE DETECTED  LABBARB NONE DETECTED    Alcohol Level  Recent Labs  Lab 06/28/21 1126  ETH <10    IMAGING  past 24 hours CT Knee Left Wo Contrast  Result Date: 06/28/2021 CLINICAL DATA:  Evaluate for hemarthrosis. Bruising and swelling to LEFT knee. EXAM: CT OF THE  KNEE WITHOUT CONTRAST TECHNIQUE: Multidetector CT imaging of the knee was performed according to the standard protocol. Multiplanar CT image reconstructions were also generated. RADIATION DOSE REDUCTION: This exam was performed according to the departmental dose-optimization program which includes automated exposure control, adjustment of the mA and/or kV according to patient size and/or use of iterative reconstruction technique. COMPARISON:  None. FINDINGS: No osseous fracture line or displaced fracture fragment seen. Tricompartmental degenerative osteoarthritic changes, with joint space narrowings and osseous spurring, most pronounced at the lateral compartment. Small joint effusion, mostly localized to the suprapatellar fossa, without layering density to suggest hemarthrosis. Pronounced masslike thickening within the subcutaneous soft tissues overlying the knee (series 4, images 55 through 98), measuring up to 2.5 cm thickness (series 4, image 67) most pronounced anteriorly and medially. This could represent a soft tissue mass or phlegmonous-like infectious collection. Additional ill-defined fluid stranding throughout the subcutaneous soft tissues about the knee and proximal tib-fib, suggesting underlying cellulitis. IMPRESSION: 1. Pronounced masslike thickening within  the subcutaneous soft tissues overlying the knee, measuring up to 2.5 cm thickness, most pronounced anteriorly and medially. This could represent a soft tissue mass or phlegmonous-like infectious collection. If any recent injury, this could represent soft tissue hematoma. 2. Additional ill-defined fluid stranding throughout the subcutaneous soft tissues about the knee and proximal tib-fib, suggesting underlying cellulitis. 3. Small joint effusion, mostly localized to the suprapatellar  fossa, without layering density to suggest hemarthrosis. 4. Tricompartmental degenerative osteoarthritic changes, most pronounced at the lateral compartment. 5. No acute-appearing osseous abnormality. Electronically Signed   By: Franki Cabot M.D.   On: 06/28/2021 19:23   MR ANGIO HEAD WO CONTRAST  Result Date: 06/28/2021 CLINICAL DATA:  Mental status change, unknown cause. EXAM: MRI HEAD WITHOUT CONTRAST MRA HEAD WITHOUT CONTRAST MRA NECK WITHOUT CONTRAST TECHNIQUE: Multiplanar, multi-echo pulse sequences of the brain and surrounding structures were acquired without intravenous contrast. Angiographic images of the Circle of Willis were acquired using MRA technique without intravenous contrast. Angiographic images of the neck were acquired using MRA technique without intravenous contrast. Carotid stenosis measurements (when applicable) are obtained utilizing NASCET criteria, using the distal internal carotid diameter as the denominator. COMPARISON:  Noncontrast head CT and CT angiogram head/neck performed earlier today 06/28/2021. FINDINGS: MRI HEAD FINDINGS Brain: Mild intermittent motion degradation. Mild generalized cerebral and cerebellar atrophy. There are fairly numerous scattered subcentimeter acute infarcts within the bilateral cerebral hemispheres, right basal ganglia, posterior limb of right internal capsule, left thalamus and within the bilateral cerebellar hemispheres. Involvement of multiple vascular territories raises suspicion for an embolic process. Background minimal multifocal T2 FLAIR hyperintense signal abnormality within the cerebral white matter, nonspecific but compatible with chronic small vessel ischemic disease. 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 There are a few nonspecific supratentorial chronic parenchymal microhemorrhages. No evidence of an intracranial mass. No extra-axial fluid collection. No midline shift. Vascular: Maintained flow voids  within the proximal large arterial vessels. Skull and upper cervical spine: No focal suspicious marrow lesion. Sinuses/Orbits: Visualized orbits show no acute finding. Bilateral ocular lens replacements. Mild mucosal thickening within the left frontal sinus and within a posterior left ethmoid air cell. MRA HEAD FINDINGS Mildly motion degraded exam. Anterior circulation: The intracranial internal carotid arteries are patent. Minimal atherosclerotic irregularity of both vessels. The M1 middle cerebral arteries are patent. No M2 proximal branch occlusion or high-grade proximal stenosis is identified. Mild atherosclerotic irregularity of the M2 and more distal MCA vessels, bilaterally. The anterior cerebral arteries are patent. No intracranial aneurysm is identified. Posterior circulation: The intracranial vertebral arteries are patent. The non dominant left vertebral artery is developmentally diminutive beyond the origin of the left PICA. The basilar artery is patent. The posterior cerebral arteries are patent. Posterior communicating arteries are present bilaterally. Anatomic variants: None significant. MRA NECK FINDINGS Aortic arch: Motion degradation and noncontrast technique limits evaluation of the aortic arch and origins of the great vessels of the neck. Right carotid system: Within described limitations, the CCA and ICA are patent within the neck without hemodynamically significant stenosis (50% or greater). Mild atherosclerotic plaque within the proximal ICA. Left carotid system: Within described limitations, the CCA and ICA are patent within the neck without hemodynamically significant stenosis. Mild atherosclerotic plaque within the carotid bifurcation and proximal ICA. Vertebral arteries: Within described limitations, the vertebral arteries are patent within the neck without hemodynamically significant stenosis. The right vertebral artery is dominant. IMPRESSION: MRI brain: 1. Mildly motion degraded exam. 2.  Fairly numerous subcentimeter acute  infarcts within the bilateral cerebral hemispheres, right basal ganglia, posterior limb of right internal capsule, left thalamus and within the bilateral cerebellar hemispheres. Involvement of multiple vascular territories raises suspicion for an embolic process. 3. Background minimal chronic small-vessel ischemic changes within the cerebral white matter. 4. 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 5. Mild generalized parenchymal atrophy. 6. Mild paranasal sinus disease, as described. MRA head: 1. No intracranial large vessel occlusion or proximal high-grade arterial stenosis. 2. Intracranial atherosclerotic disease, as described. MRA neck: 1. Motion degradation and non-contrast technique limits evaluation of the aortic arch and proximal major branch vessels of the neck. 2. Within this limitation, the common carotid, internal carotid and vertebral arteries are patent within neck without hemodynamically significant stenosis. Mild atherosclerotic plaque within the bilateral carotid systems within the neck, as described. Electronically Signed   By: Kellie Simmering D.O.   On: 06/28/2021 18:07   MR ANGIO NECK WO CONTRAST  Result Date: 06/28/2021 CLINICAL DATA:  Mental status change, unknown cause. EXAM: MRI HEAD WITHOUT CONTRAST MRA HEAD WITHOUT CONTRAST MRA NECK WITHOUT CONTRAST TECHNIQUE: Multiplanar, multi-echo pulse sequences of the brain and surrounding structures were acquired without intravenous contrast. Angiographic images of the Circle of Willis were acquired using MRA technique without intravenous contrast. Angiographic images of the neck were acquired using MRA technique without intravenous contrast. Carotid stenosis measurements (when applicable) are obtained utilizing NASCET criteria, using the distal internal carotid diameter as the denominator. COMPARISON:  Noncontrast head CT and CT angiogram head/neck performed earlier today  06/28/2021. FINDINGS: MRI HEAD FINDINGS Brain: Mild intermittent motion degradation. Mild generalized cerebral and cerebellar atrophy. There are fairly numerous scattered subcentimeter acute infarcts within the bilateral cerebral hemispheres, right basal ganglia, posterior limb of right internal capsule, left thalamus and within the bilateral cerebellar hemispheres. Involvement of multiple vascular territories raises suspicion for an embolic process. Background minimal multifocal T2 FLAIR hyperintense signal abnormality within the cerebral white matter, nonspecific but compatible with chronic small vessel ischemic disease. 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 There are a few nonspecific supratentorial chronic parenchymal microhemorrhages. No evidence of an intracranial mass. No extra-axial fluid collection. No midline shift. Vascular: Maintained flow voids within the proximal large arterial vessels. Skull and upper cervical spine: No focal suspicious marrow lesion. Sinuses/Orbits: Visualized orbits show no acute finding. Bilateral ocular lens replacements. Mild mucosal thickening within the left frontal sinus and within a posterior left ethmoid air cell. MRA HEAD FINDINGS Mildly motion degraded exam. Anterior circulation: The intracranial internal carotid arteries are patent. Minimal atherosclerotic irregularity of both vessels. The M1 middle cerebral arteries are patent. No M2 proximal branch occlusion or high-grade proximal stenosis is identified. Mild atherosclerotic irregularity of the M2 and more distal MCA vessels, bilaterally. The anterior cerebral arteries are patent. No intracranial aneurysm is identified. Posterior circulation: The intracranial vertebral arteries are patent. The non dominant left vertebral artery is developmentally diminutive beyond the origin of the left PICA. The basilar artery is patent. The posterior cerebral arteries are patent. Posterior  communicating arteries are present bilaterally. Anatomic variants: None significant. MRA NECK FINDINGS Aortic arch: Motion degradation and noncontrast technique limits evaluation of the aortic arch and origins of the great vessels of the neck. Right carotid system: Within described limitations, the CCA and ICA are patent within the neck without hemodynamically significant stenosis (50% or greater). Mild atherosclerotic plaque within the proximal ICA. Left carotid system: Within described limitations, the CCA and ICA are patent  within the neck without hemodynamically significant stenosis. Mild atherosclerotic plaque within the carotid bifurcation and proximal ICA. Vertebral arteries: Within described limitations, the vertebral arteries are patent within the neck without hemodynamically significant stenosis. The right vertebral artery is dominant. IMPRESSION: MRI brain: 1. Mildly motion degraded exam. 2. Fairly numerous subcentimeter acute infarcts within the bilateral cerebral hemispheres, right basal ganglia, posterior limb of right internal capsule, left thalamus and within the bilateral cerebellar hemispheres. Involvement of multiple vascular territories raises suspicion for an embolic process. 3. Background minimal chronic small-vessel ischemic changes within the cerebral white matter. 4. 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 5. Mild generalized parenchymal atrophy. 6. Mild paranasal sinus disease, as described. MRA head: 1. No intracranial large vessel occlusion or proximal high-grade arterial stenosis. 2. Intracranial atherosclerotic disease, as described. MRA neck: 1. Motion degradation and non-contrast technique limits evaluation of the aortic arch and proximal major branch vessels of the neck. 2. Within this limitation, the common carotid, internal carotid and vertebral arteries are patent within neck without hemodynamically significant stenosis. Mild atherosclerotic  plaque within the bilateral carotid systems within the neck, as described. Electronically Signed   By: Kellie Simmering D.O.   On: 06/28/2021 18:07   MR BRAIN WO CONTRAST  Result Date: 06/28/2021 CLINICAL DATA:  Mental status change, unknown cause. EXAM: MRI HEAD WITHOUT CONTRAST MRA HEAD WITHOUT CONTRAST MRA NECK WITHOUT CONTRAST TECHNIQUE: Multiplanar, multi-echo pulse sequences of the brain and surrounding structures were acquired without intravenous contrast. Angiographic images of the Circle of Willis were acquired using MRA technique without intravenous contrast. Angiographic images of the neck were acquired using MRA technique without intravenous contrast. Carotid stenosis measurements (when applicable) are obtained utilizing NASCET criteria, using the distal internal carotid diameter as the denominator. COMPARISON:  Noncontrast head CT and CT angiogram head/neck performed earlier today 06/28/2021. FINDINGS: MRI HEAD FINDINGS Brain: Mild intermittent motion degradation. Mild generalized cerebral and cerebellar atrophy. There are fairly numerous scattered subcentimeter acute infarcts within the bilateral cerebral hemispheres, right basal ganglia, posterior limb of right internal capsule, left thalamus and within the bilateral cerebellar hemispheres. Involvement of multiple vascular territories raises suspicion for an embolic process. Background minimal multifocal T2 FLAIR hyperintense signal abnormality within the cerebral white matter, nonspecific but compatible with chronic small vessel ischemic disease. 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 There are a few nonspecific supratentorial chronic parenchymal microhemorrhages. No evidence of an intracranial mass. No extra-axial fluid collection. No midline shift. Vascular: Maintained flow voids within the proximal large arterial vessels. Skull and upper cervical spine: No focal suspicious marrow lesion. Sinuses/Orbits:  Visualized orbits show no acute finding. Bilateral ocular lens replacements. Mild mucosal thickening within the left frontal sinus and within a posterior left ethmoid air cell. MRA HEAD FINDINGS Mildly motion degraded exam. Anterior circulation: The intracranial internal carotid arteries are patent. Minimal atherosclerotic irregularity of both vessels. The M1 middle cerebral arteries are patent. No M2 proximal branch occlusion or high-grade proximal stenosis is identified. Mild atherosclerotic irregularity of the M2 and more distal MCA vessels, bilaterally. The anterior cerebral arteries are patent. No intracranial aneurysm is identified. Posterior circulation: The intracranial vertebral arteries are patent. The non dominant left vertebral artery is developmentally diminutive beyond the origin of the left PICA. The basilar artery is patent. The posterior cerebral arteries are patent. Posterior communicating arteries are present bilaterally. Anatomic variants: None significant. MRA NECK FINDINGS Aortic arch: Motion degradation and noncontrast technique limits evaluation of the  aortic arch and origins of the great vessels of the neck. Right carotid system: Within described limitations, the CCA and ICA are patent within the neck without hemodynamically significant stenosis (50% or greater). Mild atherosclerotic plaque within the proximal ICA. Left carotid system: Within described limitations, the CCA and ICA are patent within the neck without hemodynamically significant stenosis. Mild atherosclerotic plaque within the carotid bifurcation and proximal ICA. Vertebral arteries: Within described limitations, the vertebral arteries are patent within the neck without hemodynamically significant stenosis. The right vertebral artery is dominant. IMPRESSION: MRI brain: 1. Mildly motion degraded exam. 2. Fairly numerous subcentimeter acute infarcts within the bilateral cerebral hemispheres, right basal ganglia, posterior limb of  right internal capsule, left thalamus and within the bilateral cerebellar hemispheres. Involvement of multiple vascular territories raises suspicion for an embolic process. 3. Background minimal chronic small-vessel ischemic changes within the cerebral white matter. 4. 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 5. Mild generalized parenchymal atrophy. 6. Mild paranasal sinus disease, as described. MRA head: 1. No intracranial large vessel occlusion or proximal high-grade arterial stenosis. 2. Intracranial atherosclerotic disease, as described. MRA neck: 1. Motion degradation and non-contrast technique limits evaluation of the aortic arch and proximal major branch vessels of the neck. 2. Within this limitation, the common carotid, internal carotid and vertebral arteries are patent within neck without hemodynamically significant stenosis. Mild atherosclerotic plaque within the bilateral carotid systems within the neck, as described. Electronically Signed   By: Kellie Simmering D.O.   On: 06/28/2021 18:07   DG Chest Portable 1 View  Result Date: 06/28/2021 CLINICAL DATA:  Fall EXAM: PORTABLE CHEST 1 VIEW COMPARISON:  None. FINDINGS: Lungs are clear. No pneumothorax or pleural effusion. Cardiac size is mildly enlarged. Prominent calcification of the mitral valve annulus noted. Rounded opacity within the right perihilar region represents vascular shadow, better assessed on prior CT examination of 07/28/2006. Pulmonary vascularity is otherwise normal. No acute bone abnormality. IMPRESSION: No radiographic evidence of acute cardiopulmonary disease. Mild cardiomegaly Electronically Signed   By: Fidela Salisbury M.D.   On: 06/28/2021 19:41     PHYSICAL EXAM Temp:  [98.4 F (36.9 C)-98.6 F (37 C)] 98.4 F (36.9 C) (02/06 1224) Pulse Rate:  [29-85] 72 (02/06 1224) Resp:  [17-25] 18 (02/06 1224) BP: (104-155)/(60-126) 113/98 (02/06 1224) SpO2:  [94 %-97 %] 95 % (02/06 1224)  General  - Well nourished, well developed, in no apparent distress.  Cardiovascular - A Fib on telemetry.  Mental Status -  Level of arousal and orientation to time, place, and person were intact. Language including expression, naming, repetition, comprehension was assessed and found intact. Mild dysarthria present Attention span and concentration were normal. Recent and remote memory were largely intact. 2/3 recall, able to name 11 animals that walk on four legs Fund of Knowledge was assessed and was intact.  Cranial Nerves II - XII - II - Visual field intact OU. III, IV, VI - Extraocular movements intact. With L ptosis at rest. Able to open eye fully V - Facial sensation intact bilaterally. VII - L sided facial droop resting and smiling. Able to lift eyebrows appropriately VIII - Hearing & vestibular intact bilaterally. X - Palate elevates symmetrically. XI - Chin turning & shoulder shrug intact bilaterally. XII - Tongue protrusion intact.  Motor Strength - The patients strength was decreased in LUE and LLE, 3/5 (vs 4/5 on RUE and RLE) but pronator drift was absent.  Bulk was normal and fasciculations were absent.  Motor Tone - Muscle tone was assessed at the neck and appendages and was normal.  Sensory - Light touch was assessed and symmetrical.    Coordination - The patient had normal movements in the hands and feet with no ataxia or dysmetria.  Tremor was absent.  Gait and Station - deferred.    ASSESSMENT/PLAN [name] is a 81 y.o. female with history of PAF on Xarelto, prior stroke/TIA, and chronic asthma  presenting with acute onset L sided weakness and fall.   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  MRA  No large vessel occlusions or stenosis. Mild atherosclerotic plaques of bilateral carotids within the neck 2D Echo LVEF 60-65%, Mildly dilated LV internal cavity, grade I diastolic dysfunction, moderately dilated LA, moderate mitral annular calcification LDL 109 HgbA1c 5.1 VTE prophylaxis - Eliquis Xarelto (rivaroxaban) daily prior to admission, now on Eliquis (apixaban) daily. 5 mg BID Therapy recommendations:  CIR Disposition:  CIR, pending insurance approval  Hypertension Home meds:  N/A Stable BP goal < 180/0105 given on AC Long-term BP goal normotensive  Hyperlipidemia Home meds:  Fish oil 1000 mg LDL 109, goal < 70 Add Atorvastatin 20 mg daily High intensity statin not indicated given advanced age and LDL not far from goal Continue statin at discharge  Other Stroke Risk Factors Advanced Age >/= 63  Former Cigarette smoker, quit 44 years ago Denies ETOH use, alcohol level <10 Hx stroke/TIA  Other Active Problems Chronic Asthma COPD  Hospital day # 1  Rosezetta Schlatter, MD Stroke Neurology- Neuro Psych Resident 06/29/2021 1:32 PM  ATTENDING NOTE: I reviewed above note and agree with the assessment and plan. Pt was seen and examined.   81 year old female with history of PAF on Xarelto, COPD, stroke admitted for left-sided weakness, fall and slurred speech.  CT no acute abnormality but chronic right caudate head infarct.  CT head and neck unremarkable.  MRI showed embolic shower.  EF 60 to 65%, UDS negative, UA negative.  LDL 109, A1c 5.1, creatinine 0.8.  On exam, patient's son at bedside, patient awake alert, orientated x3.  Mild to moderate  dysarthria, no aphasia, fluent language, follows simple commands, able to name and repeat.  No gaze palsy, visual field full, left facial droop, tongue midline, left upper extremity 3/5, left lower 3/5 comparing with right upper and lower extremity at least 4/5.  Sensation symmetrical, finger-to-nose left ataxic but not out of proportion to weakness.  Gait not tested  Etiology of patient's stroke likely embolic pattern from A-fib even on Xarelto.  Patient now clearly had Xarelto failure, will switch to Eliquis for further stroke prevention.  Put on Lipitor 20 for HLD.  Risk factor modification, PT/OT recommended CIR.  For detailed assessment and plan, please refer to above as I have made changes wherever appropriate.   I had long discussion with patient and son at bedside, updated pt current condition, treatment plan and potential prognosis, and answered  all the questions.  They expressed understanding and appreciation.   Neurology will sign off. Please call with questions. Pt will follow up with stroke clinic NP at [facility] in about 4 weeks. Thanks for the consult.  Laura Hawking, MD PhD Stroke Neurology 06/29/2021 5:01 PM    To contact Stroke Continuity provider, please refer to [email or web address] After hours, contact General Neurology

## 2021-06-29 NOTE — ED Notes (Signed)
Pt to use bedpan. Pts bed sheets changed and repositioned. No other needs from pt.

## 2021-06-29 NOTE — Hospital Course (Addendum)
Laura Mendez is an 81 y.o. female with a history of PAF rigidly adherent to xarelto, breast CA requiring only lumpectomy on letrozole, COPD who presented to the ED 2/5 with left-sided weakness causing fall when getting out of bed at 3am also with left knee pain. She was noted to have dysarthria and left facial droop. CT initially showed no acute infarcts, though subsequent MRI confirmed numerous acute infarcts bilaterally including right basa ganglia, right PLIC, left thalamus, and cerebellum. No LVO on MRA. Left knee XR than CT revealed soft tissue swelling and mild suprapatellar effusion with no acute osseous injury. Neurology was consulted. PT/OT/SLP recommend CIR. Insurance is pending.

## 2021-06-29 NOTE — Plan of Care (Signed)
°  Problem: Education: Goal: Knowledge of disease or condition will improve Outcome: Progressing   Problem: Ischemic Stroke/TIA Tissue Perfusion: Goal: Complications of ischemic stroke/TIA will be minimized Outcome: Progressing   

## 2021-06-29 NOTE — Progress Notes (Signed)
Progress Note  Patient: Laura Mendez DXA:[id number] DOB: [birth year]  DOA: 06/28/2021  DOS: 06/29/2021    Brief hospital course: Theola Cuellar is an 81 y.o. female with a history of PAF rigidly adherent to xarelto, breast CA requiring only lumpectomy on letrozole, COPD who presented to the ED 2/5 with left-sided weakness causing fall when getting out of bed at 3am also with left knee pain. She was noted to have dysarthria and left facial droop. CT initially showed no acute infarcts, though subsequent MRI confirmed numerous acute infarcts bilaterally including right basa ganglia, right PLIC, left thalamus, and cerebellum. No LVO on MRA. Left knee XR than CT revealed soft tissue swelling and mild suprapatellar effusion with no acute osseous injury. Neurology was consulted. PT/OT/SLP and echo are pending.   Assessment and Plan: * CVA (cerebral vascular accident) ([place])- (present on admission) Multiple vascular territories bilaterally with predominance of symptoms on left suggestive that most symptomatic infarcts are from right PLIC. Strong suspicion for embolic phenomenon despite adherence to xarelto.  - Echo pending to eval CES. If IAS, would need LE U/S.  - Continue anticoagulation, will switch to eliquis since she has "failed" xarelto. Follows for oncology with Dr. Lindi Adie. May need input regarding change to anticoagulation. - Start atorvastatin for LDL 109 (goal < 70). HbA1c wnl. - PT/OT/SLP evaluations pending. Significant symptoms, lives alone, anticipate facility/inpatient rehab recommendation. - Stroke neurology to follow up today.  Fall at home, initial encounter PT/OT  Hematoma of left knee region- (present on admission) Monitor for stability. Without significant hemarthrosis, will continue anticoagulation. May need drainage if not resorbing on its own.  Chronic diastolic CHF (congestive heart failure) ([place])- (present on admission) Euvolemic, will decrease IVF rate, DC once  cleared for oral intake.  Malignant neoplasm of lower-outer quadrant of right breast of female, estrogen receptor positive [facility]) Dx June 2019 stage IA s/p lumpectomy. No chemo/XRT recommended.  - Continue letrozole (+ER, +PR, -HER2)  Rash and nonspecific skin eruption- (present on admission) Followed for nonspecific dermatitis by dermatology at [facility]. On dupixent with improvement.   Paroxysmal atrial flutter ([place])- (present on admission) Sinus arrhythmia at time of admission.  - Continue anticoagulation due to high CHA2DS2-VASc score. May change agent.  Asthma with COPD ([place])- (present on admission) Rather severe, followed by pulmonology, FVC 60%, FEV1 34%. Quiescent currently. Nonacute CXR.  - Continue inhaled treatments   Subjective: No new deficits, though stable weakness on her left side including non-dominant left hand. Left knee was not swollen before fall.   Objective: Vitals:   06/29/21 0800 06/29/21 0830 06/29/21 1042 06/29/21 1045  BP: (!) 123/93 (!) 108/91  122/77  Pulse: (!) 29 67  83  Resp: (!) 24 20  (!) 25  Temp:   98.6 F (37 C)   TempSrc:   Oral   SpO2: 95% 95%  95%  Weight:      Height:       Gen: Nontoxic 81 y.o. female in no distress Pulm: Nonlabored breathing room air. No wheezes or crackles CV: Regular rate and rhythm. No murmur, rub, or gallop. No JVD, no pitting dependent edema. GI: Abdomen soft, non-tender, non-distended, with normoactive bowel sounds.  Ext: Warm, Anteromedial left knee swelling, tender. No increased warmth.  Skin: Dorsal elbows with erythematous irritation bilaterally. Mild, no wounds. This is chronic and improving followed by dermatology per pt. No rashes, lesions or ulcers on visualized skin. Neuro: Alert and oriented. Mild dysarthria, left upper and lower facial droop, Left hand,  elbow, and shoulder weakness 3/5. R normal. LLE weakness as well. Psych: Judgement and insight appear fair. Mood euthymic & affect congruent. Behavior is  appropriate.    Data Personally reviewed: LDL: 109, HDL 29   HbA1c 5.1% INR 1.7, LFTs wnl. BMP and CBC unremarkable.   CT head:  1. No evidence of acute intracranial abnormality. 2. 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 3. Mild generalized cerebral and cerebellar atrophy. CTA neck: 1. The common carotid, internal carotid and vertebral arteries are patent within the neck without hemodynamically significant stenosis. Mild atherosclerotic plaque within the bilateral carotid systems within the neck, as described. 2. Aortic Atherosclerosis (ICD10-I70.0) and Emphysema (ICD10-J43.9).   CTA head:  1. No intracranial large vessel occlusion or proximal high-grade arterial stenosis identified. 2. Nonstenotic atherosclerotic plaque within the intracranial ICAs. 3. Atherosclerotic irregularity of the M2 and more distal MCA vessels, bilaterally.   MRI brain:  1. Mildly motion degraded exam. 2. Fairly numerous subcentimeter acute infarcts within the bilateral cerebral hemispheres, right basal ganglia, posterior limb of right internal capsule, left thalamus and within the bilateral cerebellar hemispheres. Involvement of multiple vascular territories raises suspicion for an embolic process. 3. Background minimal chronic small-vessel ischemic changes within the cerebral white matter. 4. 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 5. Mild generalized parenchymal atrophy. 6. Mild paranasal sinus disease, as described.   MRA head:  1. No intracranial large vessel occlusion or proximal high-grade arterial stenosis. 2. Intracranial atherosclerotic disease, as described.   MRA neck:  1. Motion degradation and non-contrast technique limits evaluation of the aortic arch and proximal major branch vessels of the neck. 2. Within this limitation, the common carotid, internal carotid and vertebral arteries are patent within neck without hemodynamically significant stenosis.  Mild atherosclerotic plaque within the bilateral carotid systems within the neck, as described.   Left knee XR 4v: no acute osseous changes. Severe soft tissue swelling.  Left knee CT:  Pronounced masslike thickening within the subcutaneous soft tissues overlying the knee, measuring up to 2.5 cm thickness, most pronounced anteriorly and medially. This could represent a soft tissue mass or phlegmonous-like infectious collection. If any recent injury, this could represent soft tissue hematoma.  2. Additional ill-defined fluid stranding throughout the subcutaneous soft tissues about the knee and proximal tib-fib, suggesting underlying cellulitis.  3. Small joint effusion, mostly localized to the suprapatellar fossa, without layering density to suggest hemarthrosis.  4. Tricompartmental degenerative osteoarthritic changes, most pronounced at the lateral compartment.  5. No acute-appearing osseous abnormality.   CT C-SPINE NO CHARGE  1. No static evidence of acute injury to the cervical spine. 2. Mild multilevel degenerative disc disease/spondylosis and mild to moderate multilevel facet arthropathy.    DG Forearm Left No acute osseous injury of the left forearm.   CXR: Non acute SARS-CoV-2 PCR (2/5): Negative Influenza A/B: Negative  Family Communication: Son at bedside  Disposition: Status is: Inpatient Remains inpatient appropriate because: Ongoing stroke work up, therapy evaluation for ongoing stroke-related deficits. Unable to safely discharge home at this time. Planned Discharge Destination:  TBD  Patrecia Pour, MD 06/29/2021 11:08 AM Page by [email or web address]

## 2021-06-29 NOTE — Progress Notes (Signed)
°  Transition of Care [facility]) Screening Note   Patient Details  Name: Laura Mendez Date of Birth: [birth year]   Transition of Care [facility]) CM/SW Contact:    Pollie Friar, RN Phone Number: 06/29/2021, 1:58 PM    Transition of Care Department [facility]) has reviewed patient. We will continue to monitor patient advancement through interdisciplinary progression rounds. If new patient transition needs arise, please place a TOC consult.

## 2021-06-29 NOTE — Assessment & Plan Note (Signed)
Dx June 2019 stage IA s/p lumpectomy. No chemo/XRT recommended.  - Continue letrozole (+ER, +PR, -HER2)

## 2021-06-29 NOTE — Assessment & Plan Note (Signed)
PT/OT

## 2021-06-29 NOTE — Assessment & Plan Note (Addendum)
Showing evidence of improvement. Without significant hemarthrosis, will continue anticoagulation. May need drainage if not resorbing on its own.

## 2021-06-29 NOTE — Evaluation (Signed)
Physical Therapy Evaluation Patient Details Name: Laura Mendez MRN: [id number] DOB: 1941/03/16 Today's Date: 06/29/2021  History of Present Illness  Pt is a 81 y/o female presenting on 2/5 after fall due to L sided weakness, speech difficulty. 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 PMH includes: TIA, asthma, afib, breast CA.  Clinical Impression  Pt admitted secondary to problem above with deficits below. Pt requiring mod to max A +2 for mobility tasks this session. Presenting with increased pain and weakness in LLE and demonstrated buckling in standing this session. Pt previously independent. Recommending acute inpatient rehab to maximize functional mobility independence and safety. Pt also with good family support. Will continue to follow acutely.        Recommendations for follow up therapy are one component of a multi-disciplinary discharge planning process, led by the attending physician.  Recommendations may be updated based on patient status, additional functional criteria and insurance authorization.  Follow Up Recommendations Acute inpatient rehab (3hours/day)    Assistance Recommended at Discharge Frequent or constant Supervision/Assistance  Patient can return home with the following  Two people to help with walking and/or transfers;Two people to help with bathing/dressing/bathroom    Equipment Recommendations Wheelchair cushion (measurements PT);Wheelchair (measurements PT)  Recommendations for Other Services  Rehab consult    Functional Status Assessment Patient has had a recent decline in their functional status and demonstrates the ability to make significant improvements in function in a reasonable and predictable amount of time.     Precautions / Restrictions Precautions Precautions: Fall Restrictions Weight Bearing  Restrictions: No      Mobility  Bed Mobility Overal bed mobility: Needs Assistance Bed Mobility: Supine to Sit, Sit to Supine     Supine to sit: Min assist, +2 for safety/equipment Sit to supine: Max assist, +2 for physical assistance   General bed mobility comments: Required min A for trunk assist and assist to scoot to EOB. Required max A +2 for LE land trunk assist to return to supine.    Transfers Overall transfer level: Needs assistance Equipment used: 2 person hand held assist Transfers: Sit to/from Stand Sit to Stand: Mod assist, +2 physical assistance           General transfer comment: Mod A +2 for lift assist and steadying. Functional weakness noted in LLE with some buckling noted. Unable to take steps.    Ambulation/Gait                  Stairs            Wheelchair Mobility    Modified Rankin (Stroke Patients Only) Modified Rankin (Stroke Patients Only) Pre-Morbid Rankin Score: No symptoms Modified Rankin: Moderately severe disability     Balance Overall balance assessment: Needs assistance Sitting-balance support: No upper extremity supported, Feet supported Sitting balance-Leahy Scale: Fair     Standing balance support: Bilateral upper extremity supported Standing balance-Leahy Scale: Poor Standing balance comment: Reliant on BUE and external support                             Pertinent Vitals/Pain Pain Assessment Pain Assessment: No/denies pain    Home Living Family/patient expects to be discharged to:: Private residence Living Arrangements: Alone Available Help at Discharge: Family;Available 24 hours/day Type of Home: Other(Comment) (townhouse) Home Access: Stairs to enter Entrance Stairs-Rails: Right  Entrance Stairs-Number of Steps: 2 (2 curb steps) Alternate Level Stairs-Number of Steps: flight Home Layout: Two level Home Equipment: Grab bars - tub/shower;Shower seat;Cane - single Barista (2  wheels)      Prior Function Prior Level of Function : Independent/Modified Independent;Driving                     Hand Dominance   Dominant Hand: Right    Extremity/Trunk Assessment   Upper Extremity Assessment Upper Extremity Assessment: Defer to OT evaluation LUE Deficits / Details: decreased strength (3-/5MMT), coordination LUE Sensation: decreased proprioception LUE Coordination: decreased fine motor;decreased gross motor    Lower Extremity Assessment Lower Extremity Assessment: LLE deficits/detail;Generalized weakness LLE Deficits / Details: At least 3/5 throughout. Pt unable to have formal MMT as pt with L knee contusion and increased pain.    Cervical / Trunk Assessment Cervical / Trunk Assessment: Kyphotic  Communication   Communication: No difficulties  Cognition Arousal/Alertness: Awake/alert Behavior During Therapy: WFL for tasks assessed/performed Overall Cognitive Status: Impaired/Different from baseline Area of Impairment: Problem solving, Safety/judgement                         Safety/Judgement: Decreased awareness of safety   Problem Solving: Slow processing, Requires verbal cues, Difficulty sequencing General Comments: increaed time to process and sequence, fair awareness to deficits and safety        General Comments General comments (skin integrity, edema, etc.): Pt's son present during session    Exercises     Assessment/Plan    PT Assessment Patient needs continued PT services  PT Problem List Decreased strength;Decreased balance;Decreased mobility;Decreased cognition;Decreased knowledge of use of DME;Decreased safety awareness;Decreased knowledge of precautions       PT Treatment Interventions DME instruction;Gait training;Functional mobility training;Stair training;Therapeutic activities;Therapeutic exercise;Balance training;Patient/family education    PT Goals (Current goals can be found in the Care Plan section)   Acute Rehab PT Goals Patient Stated Goal: to get stronger before going home PT Goal Formulation: With patient Time For Goal Achievement: 07/13/21 Potential to Achieve Goals: Good    Frequency Min 4X/week     Co-evaluation PT/OT/SLP Co-Evaluation/Treatment: Yes Reason for Co-Treatment: Complexity of the patient's impairments (multi-system involvement);For patient/therapist safety;To address functional/ADL transfers PT goals addressed during session: Mobility/safety with mobility;Balance OT goals addressed during session: ADL's and self-care       AM-PAC PT "6 Clicks" Mobility  Outcome Measure Help needed turning from your back to your side while in a flat bed without using bedrails?: A Lot Help needed moving from lying on your back to sitting on the side of a flat bed without using bedrails?: A Lot Help needed moving to and from a bed to a chair (including a wheelchair)?: Total Help needed standing up from a chair using your arms (e.g., wheelchair or bedside chair)?: Total Help needed to walk in hospital room?: Total Help needed climbing 3-5 steps with a railing? : Total 6 Click Score: 8    End of Session Equipment Utilized During Treatment: Gait belt Activity Tolerance: Patient tolerated treatment well Patient left: in bed;with call bell/phone within reach;with family/visitor present (on stretcher in ED) Nurse Communication: Mobility status PT Visit Diagnosis: Muscle weakness (generalized) (M62.81);Unsteadiness on feet (R26.81);Hemiplegia and hemiparesis Hemiplegia - Right/Left: Left Hemiplegia - dominant/non-dominant: Non-dominant Hemiplegia - caused by: Cerebral infarction    Time: 2376-2831 PT Time Calculation (min) (ACUTE ONLY): 20 min   Charges:   PT Evaluation $PT Eval Moderate  Complexity: Sarah Ann, PT, DPT  Acute Rehabilitation Services  Pager: [phone] Office: [phone]   Rudean Hitt 06/29/2021, 12:23 PM

## 2021-06-29 NOTE — Progress Notes (Addendum)
Inpatient Rehabilitation Admissions Coordinator   I met at bedside with patient and her son, Aaron Edelman, from [place]. We discussed goals and expectations for a possible [facility] admit. They prefer [facility] and then to go home with son, Al from [facility]. I will begin insurance Auth with [place] health Care Medicare.  Danne Baxter, RN, MSN Rehab Admissions Coordinator [phone] 06/29/2021 2:24 PM

## 2021-06-30 ENCOUNTER — Ambulatory Visit: Payer: Medicare Other | Admitting: Cardiology

## 2021-06-30 NOTE — Progress Notes (Addendum)
Physical Therapy Treatment Patient Details Name: Laura Mendez MRN: [id number] DOB: [birth year] Today's Date: 06/30/2021   History of Present Illness Pt is a 81 y/o female presenting on 2/5 after fall due to L sided weakness, speech difficulty. 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 PMH includes: TIA, asthma, afib, breast CA.    PT Comments    Patient making good progress with mobility and reduced assist needed to move to EOB this session. Pt eager to mobilize but fearful of falling due to Lt weakness. Mod+2 for safety to rise from EOB to walker and assist to stabilize Lt UE with RW. Pt's Lt knee flexed in stance and facilitation provided at Lt knee and Lt elbow for extension to maintain upright posture during gait. Pt ambulated ~10 feet with RW and Mod+2 assist before fatiguing. She remains highly motivated to participate in therapy and will benefit from intense PT follow up at AIR setting. Will progress as able in acute setting.    Recommendations for follow up therapy are one component of a multi-disciplinary discharge planning process, led by the attending physician.  Recommendations may be updated based on patient status, additional functional criteria and insurance authorization.  Follow Up Recommendations  Acute inpatient rehab (3hours/day)     Assistance Recommended at Discharge Frequent or constant Supervision/Assistance  Patient can return home with the following Two people to help with walking and/or transfers;Two people to help with bathing/dressing/bathroom   Equipment Recommendations  Wheelchair cushion (measurements PT);Wheelchair (measurements PT)    Recommendations for Other Services Rehab consult     Precautions / Restrictions Precautions Precautions: Fall Restrictions Weight Bearing Restrictions: No      Mobility  Bed Mobility Overal bed mobility: Needs Assistance Bed Mobility: Supine to Sit     Supine to sit: Min assist, +2 for safety/equipment, HOB elevated     General bed mobility comments: Min assist with cues for bringing LE's off EOB and to reach for bed rail. Assist to fully raise trunk upright. pt steady once sitting EOB.    Transfers Overall transfer level: Needs assistance Equipment used: Rolling walker (2 wheels) Transfers: Sit to/from Stand, Bed to chair/wheelchair/BSC Sit to Stand: Mod assist, +2 safety/equipment   Step pivot transfers: Mod assist, +2 safety/equipment       General transfer comment: Mod Assist to power up from EOB with cues needed for using bil UE's on bed to initiate. Pt with reduced strength in Lt UE and asisst to bring hand on RW, pt choosing to reduce weight on Lt LE in standing due to weakness and knee pain.    Ambulation/Gait Ambulation/Gait assistance: Mod assist, +2 safety/equipment Gait Distance (Feet): 10 Feet Assistive device: Rolling walker (2 wheels) Gait Pattern/deviations: Step-to pattern, Decreased step length - right, Decreased step length - left, Decreased stance time - left, Decreased stride length, Decreased dorsiflexion - left, Decreased weight shift to left, Knee flexed in stance - left, Wide base of support Gait velocity: decr     General Gait Details: Mod assist for gait awith +2 for chair follow for safety. Manual facilitation of Lt elbow extension with Rt step to improve use of RW and blocking at Lt knee to prevent flexion in stance phase. as pt fatigued bil UE's becoming weaker and trunk more flexed. seated rest provided after short distance in room.   Stairs  Wheelchair Mobility    Modified Rankin (Stroke Patients Only)       Balance Overall balance assessment: Needs assistance Sitting-balance support: No upper extremity supported, Feet supported Sitting balance-Leahy Scale: Fair      Standing balance support: Bilateral upper extremity supported, During functional activity, Reliant on assistive device for balance Standing balance-Leahy Scale: Poor Standing balance comment: Reliant on BUE and external support                            Cognition Arousal/Alertness: Awake/alert Behavior During Therapy: WFL for tasks assessed/performed   Area of Impairment: Problem solving, Following commands, Safety/judgement                       Following Commands: Follows one step commands consistently, Follows multi-step commands consistently, Follows multi-step commands with increased time Safety/Judgement: Decreased awareness of safety   Problem Solving: Slow processing, Difficulty sequencing, Requires verbal cues General Comments: pt is pleasant and aware of deficits. oriented to self, place, situation. eager to participate in therapy.        Exercises Other Exercises Other Exercises: ankle pumps for LE circulation at rest, 10 reps sitting    General Comments General comments (skin integrity, edema, etc.): Lt UE IV site appears infiltrated. RN notified.      Pertinent Vitals/Pain Pain Assessment Pain Assessment: No/denies pain    Home Living     Available Help at Discharge: Family;Available 24 hours/day Type of Home:  (townhome)                  Prior Function            PT Goals (current goals can now be found in the care plan section) Acute Rehab PT Goals Patient Stated Goal: to get stronger before going home PT Goal Formulation: With patient Time For Goal Achievement: 07/13/21 Potential to Achieve Goals: Good Progress towards PT goals: Progressing toward goals    Frequency    Min 4X/week      PT Plan Current plan remains appropriate    Co-evaluation              AM-PAC PT "6 Clicks" Mobility   Outcome Measure  Help needed turning from your back to your side while in a flat bed without using bedrails?: A  Little Help needed moving from lying on your back to sitting on the side of a flat bed without using bedrails?: A Little Help needed moving to and from a bed to a chair (including a wheelchair)?: A Lot Help needed standing up from a chair using your arms (e.g., wheelchair or bedside chair)?: A Lot Help needed to walk in hospital room?: A Lot Help needed climbing 3-5 steps with a railing? : Total 6 Click Score: 13    End of Session Equipment Utilized During Treatment: Gait belt Activity Tolerance: Patient tolerated treatment well Patient left: in bed;with call bell/phone within reach;with family/visitor present Nurse Communication: Mobility status PT Visit Diagnosis: Muscle weakness (generalized) (M62.81);Unsteadiness on feet (R26.81);Hemiplegia and hemiparesis Hemiplegia - Right/Left: Left Hemiplegia - dominant/non-dominant: Non-dominant Hemiplegia - caused by: Cerebral infarction     Time: 1207-1227 PT Time Calculation (min) (ACUTE ONLY): 20 min  Charges:  $Neuromuscular Re-education: 8-22 mins                     Verner Mould, DPT Acute Rehabilitation Services Office [phone] Pager [phone]  Jacques Navy 06/30/2021, 12:59 PM

## 2021-06-30 NOTE — Evaluation (Signed)
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/28/2021   Hematoma of left knee region 06/28/2021   Fall at home, initial encounter 06/28/2021   Malignant neoplasm of lower-outer quadrant of right breast of female, estrogen receptor positive ([place]) 62/95/2841   Diastolic dysfunction 32/44/0102   Rash and nonspecific skin eruption 04/01/2016   Paroxysmal atrial flutter ([place]) 01/13/2016   Routine general medical examination at a health care facility 01/13/2016   Retinal vein occlusion 06/16/2015   Seasonal and perennial allergic rhinitis 08/06/2011   Asthma with COPD ([place]) 08/04/2007   Osteopenia 03/31/2007   Past Medical History:  Past Medical History:  Diagnosis Date   Asthma    Chronic airway obstruction, not elsewhere classified    Diastolic dysfunction without heart failure    Disorder of bone and cartilage, unspecified    Dysrhythmia    A-Fib   Family history of colon cancer    Family history of kidney cancer    Family history of ovarian cancer    Paroxysmal atrial flutter ([place])    Stroke ([place])    TIA   TIA (transient ischemic attack)    Unspecified asthma(493.90)    Past Surgical History:  Past Surgical History:  Procedure Laterality Date   ABDOMINAL HYSTERECTOMY     BREAST LUMPECTOMY Right 12/30/2017   BREAST LUMPECTOMY WITH RADIOACTIVE SEED LOCALIZATION Right 12/30/2017   Procedure: BREAST LUMPECTOMY WITH RADIOACTIVE SEED LOCALIZATION X'S 3;  Surgeon: Rolm Bookbinder, MD;  Location: [facility];  Service: General;  Laterality: Right;   BUNIONECTOMY Bilateral    EYE SURGERY Bilateral    cataract removal    HPI:  Pt is a 81 y/o female presenting on 2/5 after fall due to L sided weakness, speech difficulty. 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 PMH includes: TIA, asthma, afib, breast CA.   Assessment / Plan / Recommendation Clinical Impression  Pt presents with a mild dysarthria of speech, mild deficits in retrieval of verbal information, working and prospective memories.  Demonstrates fluctuating awareness and insight into deficits.Expressive and receptive language are WNL.  Pt was I PTA and will benefit from AIR for intensive rehab with SLP focus on money/med management and recall strategies. Pt/son agree with plan.    SLP Assessment  SLP Recommendation/Assessment: Patient needs continued Speech [place] Pathology Services SLP Visit Diagnosis: Cognitive communication deficit (R41.841)    Recommendations for follow up therapy are one component of a multi-disciplinary discharge planning process, led by the attending physician.  Recommendations may be updated based on patient status, additional functional criteria and insurance authorization.    Follow Up Recommendations  Acute inpatient rehab (3hours/day)    Assistance Recommended at Discharge     Functional Status Assessment Patient has had a recent decline in their functional status and demonstrates the ability to make significant improvements in function in a reasonable and predictable amount of time.  Frequency and Duration min 2x/week  1 week      SLP Evaluation Cognition  Overall Cognitive Status: Impaired/Different from baseline Arousal/Alertness: Awake/alert Orientation Level: Oriented to place;Oriented to time;Oriented to situation;Oriented to  person Attention: Selective Selective Attention: Appears intact Memory: Impaired Memory Impairment: Retrieval deficit;Prospective  memory Awareness: Impaired Awareness Impairment: Intellectual impairment Problem Solving: Impaired Problem Solving Impairment: Verbal basic Safety/Judgment: Impaired       Comprehension  Auditory Comprehension Overall Auditory Comprehension: Appears within functional limits for tasks assessed    Expression Expression Primary Mode of Expression: Verbal Verbal Expression Overall Verbal Expression: Appears within functional limits for tasks assessed Written Expression Dominant Hand: Right   Oral / Motor  Motor Speech Overall Motor Speech: Impaired Respiration: Within functional limits Phonation: Normal Resonance: Within functional limits Articulation: Impaired Level of Impairment: Conversation Intelligibility: Intelligible Motor Planning: Witnin functional limits            Laura Mendez 06/30/2021, 11:59 AM Estill Bamberg L. Tivis Ringer, [place] Office number [phone] Pager [phone]

## 2021-06-30 NOTE — Progress Notes (Signed)
Inpatient Rehabilitation Admissions Coordinator   CIR has been approved after peer to peer with Dr Bonner Puna. I have contacted pt's son, Aaron Edelman and he is aware. We will plan to admit tomorrow to CIR.  Danne Baxter, RN, MSN Rehab Admissions Coordinator [phone] 06/30/2021 6:02 PM

## 2021-06-30 NOTE — Progress Notes (Signed)
Inpatient Rehabilitation Admissions Coordinator   I met with son and patient at bedside to review cost of care if insurance approves CIR. I await [employer] medicare determination over the next 24 to 48 hrs.  Danne Baxter, RN, MSN Rehab Admissions Coordinator [phone] 06/30/2021 12:59 PM

## 2021-06-30 NOTE — Plan of Care (Signed)
°  Problem: Education: Goal: Knowledge of disease or condition will improve Outcome: Progressing   Problem: Ischemic Stroke/TIA Tissue Perfusion: Goal: Complications of ischemic stroke/TIA will be minimized Outcome: Progressing   

## 2021-06-30 NOTE — Progress Notes (Addendum)
Progress Note  Patient: Laura Mendez CHY:[id number] DOB: September 19, 1940  DOA: 06/28/2021  DOS: 06/30/2021    Brief hospital course: Sherin Murdoch is an 81 y.o. female with a history of PAF rigidly adherent to xarelto, breast CA requiring only lumpectomy on letrozole, COPD who presented to the ED 2/5 with left-sided weakness causing fall when getting out of bed at 3am also with left knee pain. She was noted to have dysarthria and left facial droop. CT initially showed no acute infarcts, though subsequent MRI confirmed numerous acute infarcts bilaterally including right basa ganglia, right PLIC, left thalamus, and cerebellum. No LVO on MRA. Left knee XR than CT revealed soft tissue swelling and mild suprapatellar effusion with no acute osseous injury. Neurology was consulted. PT/OT/SLP recommend CIR. Insurance is pending.  Assessment and Plan: * CVA (cerebral vascular accident) ([place])- (present on admission) Multiple vascular territories bilaterally with predominance of symptoms on left suggestive that most symptomatic infarcts are from right PLIC. Strong suspicion for embolic phenomenon despite adherence to xarelto. No CES on echo.  - Continue anticoagulation, swtiched xarelto to eliquis.   - Started atorvastatin for LDL 109 (goal < 70). HbA1c wnl. - PT/OT/SLP recommend CIR, pending insurance authorization  Fall at home, initial encounter PT/OT  Hematoma of left knee region- (present on admission) Showing evidence of improvement. Without significant hemarthrosis, will continue anticoagulation. May need drainage if not resorbing on its own.  Chronic diastolic CHF (congestive heart failure) ([place])- (present on admission) Euvolemic  Malignant neoplasm of lower-outer quadrant of right breast of female, estrogen receptor positive [facility]) Dx June 2019 stage IA s/p lumpectomy. No chemo/XRT recommended.  - Continue letrozole (+ER, +PR, -HER2)  Rash and nonspecific skin eruption- (present on  admission) Followed for nonspecific dermatitis by dermatology at [facility]. On dupixent with improvement.   Paroxysmal atrial flutter ([place])- (present on admission) Sinus arrhythmia at time of admission.  - Continue anticoagulation due to high CHA2DS2-VASc score.  Asthma with COPD ([place])- (present on admission) Rather severe, followed by pulmonology, FVC 60%, FEV1 34%. Quiescent currently. Nonacute CXR.  - Continue inhaled treatments  Subjective: Pain in left knee is improving, swelling improved. The knee did get a skin tear due to table. No chest pain, dyspnea, palpitations. She's motivated to get rehabilitation going.   Objective: Vitals:   06/29/21 2338 06/30/21 0315 06/30/21 0725 06/30/21 1135  BP: (!) 120/57 (!) 119/59 123/80 135/63  Pulse: 61 66 96 63  Resp: _0 Temp: 98.4 F (36.9 C) 98.3 F (36.8 C) 98.7 F (37.1 C) 98.2 F (36.8 C)  TempSrc: Oral  Oral Oral  SpO2: 94% 95% 95% 94%  Weight:      Height:       Gen: Pleasant 81 y.o. female in no distress Pulm: Nonlabored breathing room air. CV: Irreg. No murmur, rub, or gallop. No JVD, no dependent edema. GI: Abdomen soft, non-tender, non-distended, with normoactive bowel sounds.  Ext: Warm, dry Skin: Left knee with focal anteromedial swelling without palpable effusion, overlying skin tear with beefy red wound, no active bleeding. Ecchymosis evolving as usual, no other rashes, lesions or ulcers on visualized skin. Neuro: Alert and oriented. Mild dysarthria, left upper and lower facial droop, Left hand, elbow, and shoulder weakness 3/5. R normal (4+/5). LLE weakness as well. Psych: Judgement and insight appear fair. Mood euthymic & affect congruent. Behavior is appropriate.    Data Personally reviewed: LDL: 109, HDL 29   HbA1c 5.1% INR 1.7, LFTs wnl. BMP and CBC  unremarkable.    CT head:  1. No evidence of acute intracranial abnormality. 2. 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 3. Mild generalized  cerebral and cerebellar atrophy. CTA neck: 1. The common carotid, internal carotid and vertebral arteries are patent within the neck without hemodynamically significant stenosis. Mild atherosclerotic plaque within the bilateral carotid systems within the neck, as described. 2. Aortic Atherosclerosis (ICD10-I70.0) and Emphysema (ICD10-J43.9).    CTA head:  1. No intracranial large vessel occlusion or proximal high-grade arterial stenosis identified. 2. Nonstenotic atherosclerotic plaque within the intracranial ICAs. 3. Atherosclerotic irregularity of the M2 and more distal MCA vessels, bilaterally.    MRI brain:  1. Mildly motion degraded exam. 2. Fairly numerous subcentimeter acute infarcts within the bilateral cerebral hemispheres, right basal ganglia, posterior limb of right internal capsule, left thalamus and within the bilateral cerebellar hemispheres. Involvement of multiple vascular territories raises suspicion for an embolic process. 3. Background minimal chronic small-vessel ischemic changes within the cerebral white matter. 4. 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 5. Mild generalized parenchymal atrophy. 6. Mild paranasal sinus disease, as described.    MRA head:  1. No intracranial large vessel occlusion or proximal high-grade arterial stenosis. 2. Intracranial atherosclerotic disease, as described.    MRA neck:  1. Motion degradation and non-contrast technique limits evaluation of the aortic arch and proximal major branch vessels of the neck. 2. Within this limitation, the common carotid, internal carotid and vertebral arteries are patent within neck without hemodynamically significant stenosis. Mild atherosclerotic plaque within the bilateral carotid systems within the neck, as described.    Left knee XR 4v: no acute osseous changes. Severe soft tissue swelling.  Left knee CT:  Pronounced masslike thickening within the subcutaneous soft tissues  overlying the knee, measuring up to 2.5 cm thickness, most pronounced anteriorly and medially. This could represent a soft tissue mass or phlegmonous-like infectious collection. If any recent injury, this could represent soft tissue hematoma.  2. Additional ill-defined fluid stranding throughout the subcutaneous soft tissues about the knee and proximal tib-fib, suggesting underlying cellulitis.  3. Small joint effusion, mostly localized to the suprapatellar fossa, without layering density to suggest hemarthrosis.  4. Tricompartmental degenerative osteoarthritic changes, most pronounced at the lateral compartment.  5. No acute-appearing osseous abnormality.    CT C-SPINE NO CHARGE  1. No static evidence of acute injury to the cervical spine. 2. Mild multilevel degenerative disc disease/spondylosis and mild to moderate multilevel facet arthropathy.     DG Forearm Left No acute osseous injury of the left forearm.   CXR: Non acute SARS-CoV-2 PCR (2/5): Negative Influenza A/B: Negative  Family Communication: Son at bedside  Disposition: Status is: Inpatient Remains inpatient appropriate because: Unsafe DC, needs CIR Planned Discharge Destination:  CIR vs. SNF  Patrecia Pour, MD 06/30/2021 3:01 PM Page by [email or web address]

## 2021-07-01 ENCOUNTER — Inpatient Hospital Stay (HOSPITAL_COMMUNITY)
Admission: RE | Admit: 2021-07-01 | Discharge: 2021-07-15 | DRG: 057 | Disposition: A | Discharge status: Home / Self Care | Payer: Medicare Other | Source: Intra-hospital | Attending: Physical Medicine & Rehabilitation | Admitting: Physical Medicine & Rehabilitation

## 2021-07-01 ENCOUNTER — Other Ambulatory Visit: Payer: Self-pay

## 2021-07-01 ENCOUNTER — Encounter (HOSPITAL_COMMUNITY): Payer: Self-pay | Admitting: Physical Medicine & Rehabilitation

## 2021-07-01 DIAGNOSIS — M25562 Pain in left knee: Secondary | ICD-10-CM | POA: Diagnosis not present

## 2021-07-01 DIAGNOSIS — Z8673 Personal history of transient ischemic attack (TIA), and cerebral infarction without residual deficits: Secondary | ICD-10-CM | POA: Diagnosis present

## 2021-07-01 DIAGNOSIS — Z8249 Family history of ischemic heart disease and other diseases of the circulatory system: Secondary | ICD-10-CM

## 2021-07-01 DIAGNOSIS — I69319 Unspecified symptoms and signs involving cognitive functions following cerebral infarction: Secondary | ICD-10-CM

## 2021-07-01 DIAGNOSIS — E785 Hyperlipidemia, unspecified: Secondary | ICD-10-CM | POA: Diagnosis present

## 2021-07-01 DIAGNOSIS — I110 Hypertensive heart disease with heart failure: Secondary | ICD-10-CM | POA: Diagnosis present

## 2021-07-01 DIAGNOSIS — M1712 Unilateral primary osteoarthritis, left knee: Secondary | ICD-10-CM | POA: Diagnosis present

## 2021-07-01 DIAGNOSIS — I69354 Hemiplegia and hemiparesis following cerebral infarction affecting left non-dominant side: Secondary | ICD-10-CM | POA: Diagnosis not present

## 2021-07-01 DIAGNOSIS — Z87891 Personal history of nicotine dependence: Secondary | ICD-10-CM

## 2021-07-01 DIAGNOSIS — I4891 Unspecified atrial fibrillation: Secondary | ICD-10-CM | POA: Diagnosis not present

## 2021-07-01 DIAGNOSIS — I63411 Cerebral infarction due to embolism of right middle cerebral artery: Secondary | ICD-10-CM

## 2021-07-01 DIAGNOSIS — I6319 Cerebral infarction due to embolism of other precerebral artery: Secondary | ICD-10-CM | POA: Diagnosis not present

## 2021-07-01 DIAGNOSIS — Z6828 Body mass index (BMI) 28.0-28.9, adult: Secondary | ICD-10-CM

## 2021-07-01 DIAGNOSIS — I6340 Cerebral infarction due to embolism of unspecified cerebral artery: Principal | ICD-10-CM

## 2021-07-01 DIAGNOSIS — Z79899 Other long term (current) drug therapy: Secondary | ICD-10-CM

## 2021-07-01 DIAGNOSIS — J449 Chronic obstructive pulmonary disease, unspecified: Secondary | ICD-10-CM | POA: Diagnosis present

## 2021-07-01 DIAGNOSIS — Z885 Allergy status to narcotic agent status: Secondary | ICD-10-CM

## 2021-07-01 DIAGNOSIS — Z888 Allergy status to other drugs, medicaments and biological substances status: Secondary | ICD-10-CM | POA: Diagnosis not present

## 2021-07-01 DIAGNOSIS — Z7951 Long term (current) use of inhaled steroids: Secondary | ICD-10-CM | POA: Diagnosis not present

## 2021-07-01 DIAGNOSIS — Z886 Allergy status to analgesic agent status: Secondary | ICD-10-CM

## 2021-07-01 DIAGNOSIS — Z825 Family history of asthma and other chronic lower respiratory diseases: Secondary | ICD-10-CM | POA: Diagnosis not present

## 2021-07-01 DIAGNOSIS — Z853 Personal history of malignant neoplasm of breast: Secondary | ICD-10-CM

## 2021-07-01 DIAGNOSIS — I5032 Chronic diastolic (congestive) heart failure: Secondary | ICD-10-CM | POA: Diagnosis present

## 2021-07-01 DIAGNOSIS — E663 Overweight: Secondary | ICD-10-CM | POA: Diagnosis present

## 2021-07-01 DIAGNOSIS — Z79811 Long term (current) use of aromatase inhibitors: Secondary | ICD-10-CM | POA: Diagnosis not present

## 2021-07-01 DIAGNOSIS — Z881 Allergy status to other antibiotic agents status: Secondary | ICD-10-CM | POA: Diagnosis not present

## 2021-07-01 DIAGNOSIS — I6310 Cerebral infarction due to embolism of unspecified precerebral artery: Secondary | ICD-10-CM | POA: Diagnosis not present

## 2021-07-01 DIAGNOSIS — Z7901 Long term (current) use of anticoagulants: Secondary | ICD-10-CM | POA: Diagnosis not present

## 2021-07-01 MED ORDER — ATORVASTATIN CALCIUM 20 MG PO TABS: 20.0000 mg | ORAL_TABLET | Freq: Every day | ORAL | 0 refills | Status: DC

## 2021-07-01 MED ORDER — ACETAMINOPHEN 650 MG RE SUPP: 650.0000 mg | RECTAL | Status: DC | PRN

## 2021-07-01 MED ORDER — FLUTICASONE FUROATE-VILANTEROL 200-25 MCG/ACT IN AEPB
1.0000 | INHALATION_SPRAY | Freq: Every day | RESPIRATORY_TRACT | Status: DC
  Administered 2021-07-02 – 2021-07-15 (×14): 1 via RESPIRATORY_TRACT
  Filled 2021-07-01: qty 28

## 2021-07-01 MED ORDER — APIXABAN 5 MG PO TABS
5.0000 mg | ORAL_TABLET | Freq: Two times a day (BID) | ORAL | Status: DC
  Administered 2021-07-01 – 2021-07-15 (×28): 5 mg via ORAL
  Filled 2021-07-01 (×28): qty 1

## 2021-07-01 MED ORDER — ACETAMINOPHEN 160 MG/5ML PO SOLN: 650.0000 mg | ORAL | Status: DC | PRN

## 2021-07-01 MED ORDER — ACETAMINOPHEN 325 MG PO TABS
650.0000 mg | ORAL_TABLET | ORAL | Status: DC | PRN
  Administered 2021-07-07 – 2021-07-12 (×5): 650 mg via ORAL
  Filled 2021-07-01 (×5): qty 2

## 2021-07-01 MED ORDER — LETROZOLE 2.5 MG PO TABS
2.5000 mg | ORAL_TABLET | Freq: Every day | ORAL | Status: DC
  Administered 2021-07-01 – 2021-07-14 (×14): 2.5 mg via ORAL
  Filled 2021-07-01 (×14): qty 1

## 2021-07-01 MED ORDER — CALCIUM CARBONATE ANTACID 500 MG PO CHEW
1.0000 | CHEWABLE_TABLET | Freq: Three times a day (TID) | ORAL | Status: DC | PRN
  Administered 2021-07-10: 200 mg via ORAL
  Filled 2021-07-01: qty 1

## 2021-07-01 MED ORDER — ALBUTEROL SULFATE (2.5 MG/3ML) 0.083% IN NEBU
2.5000 mg | INHALATION_SOLUTION | RESPIRATORY_TRACT | Status: DC | PRN
  Administered 2021-07-07 – 2021-07-13 (×4): 2.5 mg via RESPIRATORY_TRACT
  Filled 2021-07-01 (×5): qty 3

## 2021-07-01 MED ORDER — PANTOPRAZOLE SODIUM 40 MG PO TBEC: 40.0000 mg | DELAYED_RELEASE_TABLET | Freq: Every day | ORAL | 0 refills | Status: DC

## 2021-07-01 MED ORDER — SENNOSIDES-DOCUSATE SODIUM 8.6-50 MG PO TABS
1.0000 | ORAL_TABLET | Freq: Every evening | ORAL | Status: DC | PRN
  Administered 2021-07-02: 1 via ORAL
  Filled 2021-07-01: qty 1

## 2021-07-01 MED ORDER — PANTOPRAZOLE SODIUM 40 MG PO TBEC
40.0000 mg | DELAYED_RELEASE_TABLET | Freq: Every day | ORAL | Status: DC
  Administered 2021-07-02 – 2021-07-15 (×14): 40 mg via ORAL
  Filled 2021-07-01 (×14): qty 1

## 2021-07-01 MED ORDER — ATORVASTATIN CALCIUM 10 MG PO TABS
20.0000 mg | ORAL_TABLET | Freq: Every day | ORAL | Status: DC
  Administered 2021-07-02 – 2021-07-15 (×14): 20 mg via ORAL
  Filled 2021-07-01 (×14): qty 2

## 2021-07-01 MED ORDER — APIXABAN 5 MG PO TABS: 5.0000 mg | ORAL_TABLET | Freq: Two times a day (BID) | ORAL | 2 refills | Status: DC

## 2021-07-01 NOTE — Progress Notes (Signed)
Patient admitted to floor from 3W via bed by  nurse with son by side. Denies pain at this time. Patient oriented to floor and safety. Call bell within reach and bed in lowest position.Will continue to monitor,

## 2021-07-01 NOTE — H&P (Signed)
Physical Medicine and Rehabilitation Admission H&P  CC: Acute infarct within bilateral cerebral hemispheres, right basal ganglia, posterior limb of right internal capsule, left thalamus and within the bilateral cerebellar hemispheres  HPI: Laura Mendez is an 81 year old right-handed female with history of TIA, atrial fibrillation maintained on Xarelto followed by Dr. Marlou Porch, COPD followed by Dr. Annamaria Boots, diastolic congestive heart failure, remote history of right breast cancer with lumpectomy radioactive seed localization 2019 followed by Dr. Lindi Adie, quit smoking 44 years ago.  Per chart review patient lives alone.  Two-level home.  Independent prior to admission. Good family support. Presented 06/28/2021 after a fall while walking from the foot of her bed striking her left knee with noted left-sided weakness and slurred speech.  MRI showed acute infarct within bilateral cerebral hemispheres, right basal ganglia, posterior limb of right internal capsule, left thalamus and within the bilateral cerebellar hemispheres.  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  MRA of head showed no large vessel occlusion or high-grade stenosis.  MRA of the neck without hemodynamically significant stenosis.  CT of the left knee showed small joint effusion as well as tricompartmental degenerative osteoarthritis changes mostly pronounced at the lateral compartment.  Admission chemistries unremarkable except glucose 113, urine drug screen negative, urinalysis negative nitrite, CK 236.  Echocardiogram with ejection fraction of 60 to 65% no wall motion abnormalities grade 1 diastolic dysfunction.  Neurology follow-up and she was transition from [place] to [place].  Tolerating a regular diet.  Therapy evaluations completed due to patient's left-sided weakness was admitted for a comprehensive rehab program.  Review of Systems  Constitutional:  Negative for chills and fever.  HENT:  Negative for hearing loss.    Eyes:  Negative for blurred vision and double vision.  Respiratory:  Negative for cough and shortness of breath.   Cardiovascular:  Positive for palpitations. Negative for chest pain and leg swelling.  Gastrointestinal:  Positive for constipation. Negative for heartburn, nausea and vomiting.  Genitourinary:  Negative for dysuria, flank pain and hematuria.  Musculoskeletal:  Positive for joint pain and myalgias.  Skin:  Negative for rash.  Neurological:  Positive for speech change and weakness.  All other systems reviewed and are negative. Past Medical History:  Diagnosis Date   Asthma    Chronic airway obstruction, not elsewhere classified    Diastolic dysfunction without heart failure    Disorder of bone and cartilage, unspecified    Dysrhythmia    A-Fib   Family history of colon cancer    Family history of kidney cancer    Family history of ovarian cancer    Paroxysmal atrial flutter (HCC)    Stroke ([place])    TIA   TIA (transient ischemic attack)    Unspecified asthma(493.90)    Past Surgical History:  Procedure Laterality Date   ABDOMINAL HYSTERECTOMY     BREAST LUMPECTOMY Right 12/30/2017   BREAST LUMPECTOMY WITH RADIOACTIVE SEED LOCALIZATION Right 12/30/2017   Procedure: BREAST LUMPECTOMY WITH RADIOACTIVE SEED LOCALIZATION X'S 3;  Surgeon: Rolm Bookbinder, MD;  Location: [facility];  Service: General;  Laterality: Right;   BUNIONECTOMY Bilateral    EYE SURGERY Bilateral    cataract removal   Family History  Problem Relation Age of Onset   COPD Father    Colon cancer Mother 85       mets to pancreas and liver   Kidney cancer Sister 35       d. 70   Lung cancer Sister  d. 73, smoker   Ovarian cancer Sister 34       d. 18   Cirrhosis Sister        d. 36   Melanoma Sister 77   Ovarian cancer Maternal Grandmother        d. 69   Heart disease Maternal Grandfather        rheumatic heart disease   Rectal cancer Paternal Grandfather        d. 71   Parkinson's  disease Brother    Ovarian cancer Maternal Aunt    Colon cancer Maternal Uncle        d. 72-73   Heart attack Paternal Uncle        d. 23   Kidney cancer Brother 53   Colon cancer Maternal Uncle        d. 33   Cancer Other        MGMs pat 1/2 sister with uterine and rectal cancer   Colon cancer Cousin        d. 28 - mat first cousin   Breast cancer Neg Hx    Social History:  reports that she quit smoking about 44 years ago. Her smoking use included cigarettes. She has never used smokeless tobacco. She reports that she does not drink alcohol and does not use drugs. Allergies:  Allergies  Allergen Reactions   Clarithromycin Anaphylaxis    "just about killed me"   Ibuprofen Hives   Codeine Nausea Only   Codeine Phosphate Nausea Only   Diltiazem Rash   Tape Itching and Other (See Comments)    Tears skin   Tapentadol Itching    Other reaction(s): Other (See Comments) Tears skin   Verapamil Hcl Er Rash   Facility-Administered Medications Prior to Admission  Medication Dose Route Frequency Provider Last Rate Last Admin   Mepolizumab SOLR 100 mg  100 mg Subcutaneous Q28 days Annamaria Boots, Clinton D, MD   100 mg at 12/20/17 1455   Medications Prior to Admission  Medication Sig Dispense Refill   albuterol (VENTOLIN HFA) 108 (90 Base) MCG/ACT inhaler INHALE 2 PUFFS INTO LUNGS EVERY 4 HOURS AS NEEDED 18 g 5   apixaban (ELIQUIS) 5 MG TABS tablet Take 1 tablet (5 mg total) by mouth 2 (two) times daily. 60 tablet 2   [START ON 07/02/2021] atorvastatin (LIPITOR) 20 MG tablet Take 1 tablet (20 mg total) by mouth daily. 30 tablet 0   cholecalciferol (VITAMIN D) 1000 units tablet Take 1,000 Units by mouth daily.     clobetasol cream (TEMOVATE) 1.63 % Apply 1 application topically as needed (rash).     DUPIXENT 300 MG/2ML SOPN Inject 2 mLs into the skin every 30 (thirty) days.     fish oil-omega-3 fatty acids 1000 MG capsule Take 1 g by mouth daily.     Fluticasone-Salmeterol (WIXELA INHUB) 250-50  MCG/DOSE AEPB Inhale 1 puff into the lungs 2 (two) times daily. 60 each 10   ipratropium-albuterol (DUONEB) 0.5-2.5 (3) MG/3ML SOLN Take 3 mLs by nebulization every 6 (six) hours as needed. 75 mL 12   letrozole (FEMARA) 2.5 MG tablet Take 1 tablet by mouth once daily (Patient taking differently: Take 2.5 mg by mouth at bedtime.) 90 tablet 3   Multiple Vitamin (MULTIVITAMIN) tablet Take 1 tablet by mouth daily.     [START ON 07/02/2021] pantoprazole (PROTONIX) 40 MG tablet Take 1 tablet (40 mg total) by mouth daily. 30 tablet 0   triamcinolone ointment (KENALOG) 0.1 % Apply 1  application topically 2 (two) times daily as needed (for itching).    Home: Home Living Family/patient expects to be discharged to:: Private residence Living Arrangements: Alone Available Help at Discharge: Family, Available 24 hours/day Type of Home:  (townhome) Home Access: Stairs to enter [employer] of Steps: 2 (2 curb steps) Entrance Stairs-Rails: Right Home Layout: Two level Alternate Level Stairs-Number of Steps: flight Alternate Level Stairs-Rails: Left, Right Bathroom Shower/Tub: Gaffer, Chiropodist: Standard Home Equipment: Grab bars - tub/shower, Civil engineer, contracting, Radio producer - single point, Conservation officer, nature (2 wheels)  Lives With: Alone   Functional History: Prior Function Prior Level of Function : Independent/Modified Independent, Driving   Functional Status:  Mobility: Bed Mobility Overal bed mobility: Needs Assistance Bed Mobility: Supine to Sit Supine to sit: Min assist, +2 for safety/equipment, HOB elevated Sit to supine: Max assist, +2 for physical assistance General bed mobility comments: Min assist with cues for bringing LE's off EOB and to reach for bed rail. Assist to fully raise trunk upright. pt steady once sitting EOB. Transfers Overall transfer level: Needs assistance Equipment used: Rolling walker (2 wheels) Transfers: Sit to/from Stand, Bed to  chair/wheelchair/BSC Sit to Stand: Mod assist, +2 safety/equipment Bed to/from chair/wheelchair/BSC transfer type:: Step pivot Step pivot transfers: Mod assist, +2 safety/equipment General transfer comment: Mod Assist to power up from EOB with cues needed for using bil UE's on bed to initiate. Pt with reduced strength in Lt UE and asisst to bring hand on RW, pt choosing to reduce weight on Lt LE in standing due to weakness and knee pain. Ambulation/Gait Ambulation/Gait assistance: Mod assist, +2 safety/equipment Gait Distance (Feet): 10 Feet Assistive device: Rolling walker (2 wheels) Gait Pattern/deviations: Step-to pattern, Decreased step length - right, Decreased step length - left, Decreased stance time - left, Decreased stride length, Decreased dorsiflexion - left, Decreased weight shift to left, Knee flexed in stance - left, Wide base of support General Gait Details: Mod assist for gait awith +2 for chair follow for safety. Manual facilitation of Lt elbow extension with Rt step to improve use of RW and blocking at Lt knee to prevent flexion in stance phase. as pt fatigued bil UE's becoming weaker and trunk more flexed. seated rest provided after short distance in room. Gait velocity: decr   ADL: ADL Overall ADL's : Needs assistance/impaired Grooming: Moderate assistance, Sitting Upper Body Dressing : Moderate assistance, Sitting Lower Body Dressing: Total assistance, +2 for physical assistance, +2 for safety/equipment, Sit to/from stand Toilet Transfer Details (indicate cue type and reason): unable, mod assist +2 sit to stand Functional mobility during ADLs: Moderate assistance, +2 for physical assistance, +2 for safety/equipment   Cognition: Cognition Overall Cognitive Status: Impaired/Different from baseline Arousal/Alertness: Awake/alert Orientation Level: Oriented to place, Oriented to time, Oriented to situation, Oriented to person Attention: Selective Selective Attention:  Appears intact Memory: Impaired Memory Impairment: Retrieval deficit, Prospective memory Awareness: Impaired Awareness Impairment: Intellectual impairment Problem Solving: Impaired Problem Solving Impairment: Verbal basic Safety/Judgment: Impaired Cognition Arousal/Alertness: Awake/alert Behavior During Therapy: WFL for tasks assessed/performed Overall Cognitive Status: Impaired/Different from baseline Area of Impairment: Problem solving, Following commands, Safety/judgement Following Commands: Follows one step commands consistently, Follows multi-step commands consistently, Follows multi-step commands with increased time Safety/Judgement: Decreased awareness of safety Problem Solving: Slow processing, Difficulty sequencing, Requires verbal cues General Comments: pt is pleasant and aware of deficits. oriented to self, place, situation. eager to participate in therapy.  Physical Exam: Blood pressure 137/68, pulse 63, temperature (!) 97.5 F (36.4  C), temperature source Oral, resp. rate 19, height 5\' 10"  (1.778 m), weight 91.1 kg, SpO2 97 %. Gen: no distress, normal appearing HEENT: oral mucosa pink and moist, NCAT Cardio: Reg rate Chest: normal effort, normal rate of breathing Abd: soft, non-distended Ext: no edema Psych: pleasant, normal affect Skin: intact Neurological:     Comments: Patient is alert.  Makes eye contact with examiner.  Mild dysarthria fully intelligible.  Follows commands.  Provides name and age. 3/5 strength left side, 4/5 strength right side  No results found for this or any previous visit (from the past 48 hour(s)). No results found.    Blood pressure 137/68, pulse 63, temperature (!) 97.5 F (36.4 C), temperature source Oral, resp. rate 19, height 5\' 10"  (1.778 m), weight 91.1 kg, SpO2 97 %.  Medical Problem List and Plan: 1. Functional deficits secondary to acute infarct bilateral cerebral hemispheres, right basal ganglia posterior limb right internal  capsule left thalamus/embolic shower likely due to embolism source with A-fib  -patient may shower  -ELOS/Goals: 10-14 days modI  -Admit to CIR 2.  Antithrombotics: -DVT/anticoagulation:  Pharmaceutical: Other (comment) Eliquis  -antiplatelet therapy: N/A 3. Left knee pain: Recommended applying ice 15 minutes three times per day.Tylenol as needed 4. Mood: Provide emotional support  -antipsychotic agents: N/A 5. Neuropsych: This patient is capable of making decisions on her own behalf. 6. Skin/Wound Care: Routine skin checks 7. Fluids/Electrolytes/Nutrition: Routine in and outs with follow-up chemistries 8.  Hyperlipidemia.  Continue Lipitor 9.  Permissive hypertension.  Monitor with increased mobility 10.  History of breast cancer.  Continue Femara 11.  COPD/asthma.  Monitor oxygen saturations every shift.  Continue nebulizer as needed.  Follow-up Dr. Annamaria Boots 12.  Atrial fibrillation.  Cardiac rate controlled.  Continue Eliquis.  Follow-up Dr. Marlou Porch 13.  Diastolic congestive heart failure.  Monitor for any signs of fluid overload 14. Overweight: BMI 28.82- provide dietary education    Izora Ribas, MD 07/01/2021

## 2021-07-01 NOTE — Discharge Summary (Signed)
Physician Discharge Summary   Patient: Katiya Fike MRN: [id number] DOB: [birth year]  Admit date:     06/28/2021  Discharge date: 07/01/21  Discharge Physician: Hosie Poisson   PCP: Hoyt Koch, MD   Recommendations at discharge:  Please follow up neurology as recommended.  Discharge Diagnoses: Principal Problem:   CVA (cerebral vascular accident) ([place]) Active Problems:   Asthma with COPD ([place])   Paroxysmal atrial flutter ([facility])   Rash and nonspecific skin eruption   Malignant neoplasm of lower-outer quadrant of right breast of female, estrogen receptor positive ([facility])   Chronic diastolic CHF (congestive heart failure) ([place])   Thrombophilia ([place])   Hematoma of left knee region   Fall at home, initial encounter     Hospital Course: Armeda Plumb is an 81 y.o. female with a history of PAF rigidly adherent to xarelto, breast CA requiring only lumpectomy on letrozole, COPD who presented to the ED 2/5 with left-sided weakness causing fall when getting out of bed at 3am also with left knee pain. She was noted to have dysarthria and left facial droop. CT initially showed no acute infarcts, though subsequent MRI confirmed numerous acute infarcts bilaterally including right basa ganglia, right PLIC, left thalamus, and cerebellum. No LVO on MRA. Left knee XR than CT revealed soft tissue swelling and mild suprapatellar effusion with no acute osseous injury. Neurology was consulted. PT/OT/SLP recommend CIR  Assessment and Plan: * CVA (cerebral vascular accident) ([place])- (present on admission) Multiple vascular territories bilaterally with predominance of symptoms on left suggestive that most symptomatic infarcts are from right PLIC. Strong suspicion for embolic phenomenon despite adherence to xarelto. No CES on echo.  - Continue anticoagulation, swtiched xarelto to eliquis.   - Started atorvastatin for LDL 109 (goal < 70). HbA1c wnl. - PT/OT/SLP recommend CIR, pending  insurance authorization  Fall at home, initial encounter PT/OT  Hematoma of left knee region- (present on admission) Showing evidence of improvement. Without significant hemarthrosis, will continue anticoagulation. May need drainage if not resorbing on its own.  Chronic diastolic CHF (congestive heart failure) ([place])- (present on admission) Euvolemic  Malignant neoplasm of lower-outer quadrant of right breast of female, estrogen receptor positive [facility]) Dx June 2019 stage IA s/p lumpectomy. No chemo/XRT recommended.  - Continue letrozole (+ER, +PR, -HER2)  Rash and nonspecific skin eruption- (present on admission) Followed for nonspecific dermatitis by dermatology at [facility]. On dupixent with improvement.   Paroxysmal atrial flutter ([place])- (present on admission) Sinus arrhythmia at time of admission.  - Continue anticoagulation due to high CHA2DS2-VASc score.  Asthma with COPD ([place])- (present on admission) Rather severe, followed by pulmonology, FVC 60%, FEV1 34%. Quiescent currently. Nonacute CXR.  - Continue inhaled treatments     Consultants: neurology Procedures performed: none  Disposition: Rehabilitation facility Diet recommendation:  Discharge Diet Orders (From admission, onward)     Start     Ordered   07/01/21 0000  Diet - low sodium heart healthy        07/01/21 1102           Regular diet  DISCHARGE MEDICATION: Allergies as of 07/01/2021       Reactions   Clarithromycin Anaphylaxis   "just about killed me"   Ibuprofen Hives   Codeine Nausea Only   Codeine Phosphate Nausea Only   Diltiazem Rash   Tape Itching, Other (See Comments)   Tears skin   Tapentadol Itching   Other reaction(s): Other (See Comments) Tears skin   Verapamil Hcl  Er Rash        Medication List     STOP taking these medications    hydrOXYzine 10 MG tablet Commonly known as: ATARAX   MegaRed Omega-3 Krill Oil 500 MG Caps   molnupiravir EUA 200 mg Caps capsule Commonly  known as: LAGEVRIO   predniSONE 10 MG (21) Tbpk tablet Commonly known as: STERAPRED UNI-PAK 21 TAB   Xarelto 20 MG Tabs tablet Generic drug: rivaroxaban       TAKE these medications    albuterol 108 (90 Base) MCG/ACT inhaler Commonly known as: VENTOLIN HFA INHALE 2 PUFFS INTO LUNGS EVERY 4 HOURS AS NEEDED   apixaban 5 MG Tabs tablet Commonly known as: ELIQUIS Take 1 tablet (5 mg total) by mouth 2 (two) times daily.   atorvastatin 20 MG tablet Commonly known as: LIPITOR Take 1 tablet (20 mg total) by mouth daily. Start taking on: July 02, 2021   cholecalciferol 1000 units tablet Commonly known as: VITAMIN D Take 1,000 Units by mouth daily.   clobetasol cream 0.05 % Commonly known as: TEMOVATE Apply 1 application topically as needed (rash).   Dupixent 300 MG/2ML Sopn Generic drug: Dupilumab Inject 2 mLs into the skin every 30 (thirty) days.   fish oil-omega-3 fatty acids 1000 MG capsule Take 1 g by mouth daily.   Fluticasone-Salmeterol 250-50 MCG/DOSE Aepb Commonly known as: Wixela Inhub Inhale 1 puff into the lungs 2 (two) times daily.   ipratropium-albuterol 0.5-2.5 (3) MG/3ML Soln Commonly known as: DUONEB Take 3 mLs by nebulization every 6 (six) hours as needed.   letrozole 2.5 MG tablet Commonly known as: FEMARA Take 1 tablet by mouth once daily What changed: when to take this   multivitamin tablet Take 1 tablet by mouth daily.   pantoprazole 40 MG tablet Commonly known as: PROTONIX Take 1 tablet (40 mg total) by mouth daily. Start taking on: July 02, 2021   triamcinolone ointment 0.1 % Commonly known as: KENALOG Apply 1 application topically 2 (two) times daily as needed (for itching).        Follow-up Information     [facility]. Schedule an appointment as soon as possible for a visit in 1 month(s).   Specialty: Neurology Why: stroke clinic Contact information: [place] [phone]                Discharge Exam: Danley Danker Weights   06/28/21 1129  Weight: 88.5 kg     Condition at discharge: good  The results of significant diagnostics from this hospitalization (including imaging, microbiology, ancillary and laboratory) are listed below for reference.   Imaging Studies: CT ANGIO HEAD NECK W WO CM  Result Date: 06/28/2021 CLINICAL DATA:  Provided history: Neuro deficit, acute, stroke suspected. Additional history provided: Fall, left arm weakness, slurred speech. EXAM: CT ANGIOGRAPHY HEAD AND NECK TECHNIQUE: Multidetector CT imaging of the head and neck was performed using the standard protocol during bolus administration of intravenous contrast. Multiplanar CT image reconstructions and MIPs were obtained to evaluate the vascular anatomy. Carotid stenosis measurements (when applicable) are obtained utilizing NASCET criteria, using the distal internal carotid diameter as the denominator. RADIATION DOSE REDUCTION: This exam was performed according to the departmental dose-optimization program which includes automated exposure control, adjustment of the mA and/or kV according to patient size and/or use of iterative reconstruction technique. CONTRAST:  37m OMNIPAQUE IOHEXOL 350 MG/ML SOLN COMPARISON:  No pertinent prior exams available for comparison. FINDINGS: CT HEAD  FINDINGS Brain: Mild generalized cerebral and cerebellar atrophy. 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 There is no acute intracranial hemorrhage. No demarcated cortical infarct. No extra-axial fluid collection. No evidence of an intracranial mass. No midline shift. Vascular: No hyperdense vessel.  Atherosclerotic calcifications. Skull: Normal. Negative for fracture or focal lesion. Sinuses: Minimal scattered paranasal sinus mucosal thickening. Orbits: No orbital mass or acute orbital finding. Review of the MIP images confirms the above findings CTA NECK FINDINGS Aortic  arch: The left vertebral artery arises directly from the aortic arch. Atherosclerotic plaque within the visualized aortic arch and proximal major branch vessels of the neck. Streak and beam hardening artifact arising from a dense left-sided contrast bolus partially obscures the left subclavian artery. Within this limitation, there is no appreciable hemodynamically significant innominate or proximal subclavian artery stenosis. Right carotid system: CCA and ICA patent within the neck without significant stenosis (50% or greater). Mild atherosclerotic plaque a within the proximal ICA. Left carotid system: CCA and ICA patent within the neck without significant stenosis (50% or greater). Mild atherosclerotic plaque about the carotid bifurcation and within the proximal ICA. Vertebral arteries: Vertebral arteries patent within the neck without significant stenosis. Right vertebral artery dominant. Skeleton: Cervical spine separately reported on concurrently performed CT of the cervical spine. Elsewhere, no acute bony abnormality is identified. Other neck: No neck mass or cervical lymphadenopathy. Upper chest: No consolidation within the imaged lung apices. Centrilobular emphysema. Review of the MIP images confirms the above findings CTA HEAD FINDINGS Anterior circulation: The intracranial internal carotid arteries are patent. Nonstenotic calcified plaque within both vessels. The M1 middle cerebral arteries are patent. Atherosclerotic irregularity of the M2 and more distal MCA vessels, bilaterally. No M2 proximal branch occlusion or high-grade proximal stenosis is identified. The anterior cerebral arteries are patent. No intracranial aneurysm is identified. Posterior circulation: The non dominant left vertebral artery terminates predominantly as the left PICA. The dominant intracranial right vertebral artery is patent without stenosis. The basilar artery is patent. The posterior cerebral arteries are patent. Venous sinuses:  Within the limitations of contrast timing, no convincing thrombus. Anatomic variants: Posterior communicating arteries are present bilaterally. Review of the MIP images confirms the above findings IMPRESSION: CT head: 1. No evidence of acute intracranial abnormality. 2. 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 3. Mild generalized cerebral and cerebellar atrophy. CTA neck: 1. The common carotid, internal carotid and vertebral arteries are patent within the neck without hemodynamically significant stenosis. Mild atherosclerotic plaque within the bilateral carotid systems within the neck, as described. 2. Aortic Atherosclerosis (ICD10-I70.0) and Emphysema (ICD10-J43.9). CTA head: 1. No intracranial large vessel occlusion or proximal high-grade arterial stenosis identified. 2. Nonstenotic atherosclerotic plaque within the intracranial ICAs. 3. Atherosclerotic irregularity of the M2 and more distal MCA vessels, bilaterally. Electronically Signed   By: Kellie Simmering D.O.   On: 06/28/2021 13:25   DG Chest 2 View  Result Date: 06/26/2021 CLINICAL DATA:  Burning in chest, asthma EXAM: CHEST - 2 VIEW COMPARISON:  11/27/2020 FINDINGS: Linear densities in the lung bases could reflect atelectasis or scarring. Heart is normal size. No effusions or acute bony abnormalities. IMPRESSION: Bibasilar scarring or atelectasis. Electronically Signed   By: Rolm Baptise M.D.   On: 06/26/2021 20:05   DG Forearm Left  Result Date: 06/28/2021 CLINICAL DATA:  Status post fall, left forearm pain EXAM: LEFT FOREARM - 2 VIEW COMPARISON:  None. FINDINGS: No acute fracture or dislocation. No aggressive osseous lesion. Normal alignment. Soft tissue  are unremarkable. No radiopaque foreign body or soft tissue emphysema. IMPRESSION: 1.  No acute osseous injury of the left forearm. Electronically Signed   By: Kathreen Devoid M.D.   On: 06/28/2021 12:52   CT Knee Left Wo Contrast  Result Date: 06/28/2021 CLINICAL DATA:  Evaluate for  hemarthrosis. Bruising and swelling to LEFT knee. EXAM: CT OF THE  KNEE WITHOUT CONTRAST TECHNIQUE: Multidetector CT imaging of the knee was performed according to the standard protocol. Multiplanar CT image reconstructions were also generated. RADIATION DOSE REDUCTION: This exam was performed according to the departmental dose-optimization program which includes automated exposure control, adjustment of the mA and/or kV according to patient size and/or use of iterative reconstruction technique. COMPARISON:  None. FINDINGS: No osseous fracture line or displaced fracture fragment seen. Tricompartmental degenerative osteoarthritic changes, with joint space narrowings and osseous spurring, most pronounced at the lateral compartment. Small joint effusion, mostly localized to the suprapatellar fossa, without layering density to suggest hemarthrosis. Pronounced masslike thickening within the subcutaneous soft tissues overlying the knee (series 4, images 55 through 98), measuring up to 2.5 cm thickness (series 4, image 67) most pronounced anteriorly and medially. This could represent a soft tissue mass or phlegmonous-like infectious collection. Additional ill-defined fluid stranding throughout the subcutaneous soft tissues about the knee and proximal tib-fib, suggesting underlying cellulitis. IMPRESSION: 1. Pronounced masslike thickening within the subcutaneous soft tissues overlying the knee, measuring up to 2.5 cm thickness, most pronounced anteriorly and medially. This could represent a soft tissue mass or phlegmonous-like infectious collection. If any recent injury, this could represent soft tissue hematoma. 2. Additional ill-defined fluid stranding throughout the subcutaneous soft tissues about the knee and proximal tib-fib, suggesting underlying cellulitis. 3. Small joint effusion, mostly localized to the suprapatellar fossa, without layering density to suggest hemarthrosis. 4. Tricompartmental degenerative  osteoarthritic changes, most pronounced at the lateral compartment. 5. No acute-appearing osseous abnormality. Electronically Signed   By: Franki Cabot M.D.   On: 06/28/2021 19:23   MR ANGIO HEAD WO CONTRAST  Result Date: 06/28/2021 CLINICAL DATA:  Mental status change, unknown cause. EXAM: MRI HEAD WITHOUT CONTRAST MRA HEAD WITHOUT CONTRAST MRA NECK WITHOUT CONTRAST TECHNIQUE: Multiplanar, multi-echo pulse sequences of the brain and surrounding structures were acquired without intravenous contrast. Angiographic images of the Circle of Willis were acquired using MRA technique without intravenous contrast. Angiographic images of the neck were acquired using MRA technique without intravenous contrast. Carotid stenosis measurements (when applicable) are obtained utilizing NASCET criteria, using the distal internal carotid diameter as the denominator. COMPARISON:  Noncontrast head CT and CT angiogram head/neck performed earlier today 06/28/2021. FINDINGS: MRI HEAD FINDINGS Brain: Mild intermittent motion degradation. Mild generalized cerebral and cerebellar atrophy. There are fairly numerous scattered subcentimeter acute infarcts within the bilateral cerebral hemispheres, right basal ganglia, posterior limb of right internal capsule, left thalamus and within the bilateral cerebellar hemispheres. Involvement of multiple vascular territories raises suspicion for an embolic process. Background minimal multifocal T2 FLAIR hyperintense signal abnormality within the cerebral white matter, nonspecific but compatible with chronic small vessel ischemic disease. 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 There are a few nonspecific supratentorial chronic parenchymal microhemorrhages. No evidence of an intracranial mass. No extra-axial fluid collection. No midline shift. Vascular: Maintained flow voids within the proximal large arterial vessels. Skull and upper cervical spine: No focal  suspicious marrow lesion. Sinuses/Orbits: Visualized orbits show no acute finding. Bilateral ocular lens replacements. Mild mucosal thickening within the left frontal sinus and within  a posterior left ethmoid air cell. MRA HEAD FINDINGS Mildly motion degraded exam. Anterior circulation: The intracranial internal carotid arteries are patent. Minimal atherosclerotic irregularity of both vessels. The M1 middle cerebral arteries are patent. No M2 proximal branch occlusion or high-grade proximal stenosis is identified. Mild atherosclerotic irregularity of the M2 and more distal MCA vessels, bilaterally. The anterior cerebral arteries are patent. No intracranial aneurysm is identified. Posterior circulation: The intracranial vertebral arteries are patent. The non dominant left vertebral artery is developmentally diminutive beyond the origin of the left PICA. The basilar artery is patent. The posterior cerebral arteries are patent. Posterior communicating arteries are present bilaterally. Anatomic variants: None significant. MRA NECK FINDINGS Aortic arch: Motion degradation and noncontrast technique limits evaluation of the aortic arch and origins of the great vessels of the neck. Right carotid system: Within described limitations, the CCA and ICA are patent within the neck without hemodynamically significant stenosis (50% or greater). Mild atherosclerotic plaque within the proximal ICA. Left carotid system: Within described limitations, the CCA and ICA are patent within the neck without hemodynamically significant stenosis. Mild atherosclerotic plaque within the carotid bifurcation and proximal ICA. Vertebral arteries: Within described limitations, the vertebral arteries are patent within the neck without hemodynamically significant stenosis. The right vertebral artery is dominant. IMPRESSION: MRI brain: 1. Mildly motion degraded exam. 2. Fairly numerous subcentimeter acute infarcts within the bilateral cerebral  hemispheres, right basal ganglia, posterior limb of right internal capsule, left thalamus and within the bilateral cerebellar hemispheres. Involvement of multiple vascular territories raises suspicion for an embolic process. 3. Background minimal chronic small-vessel ischemic changes within the cerebral white matter. 4. 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 5. Mild generalized parenchymal atrophy. 6. Mild paranasal sinus disease, as described. MRA head: 1. No intracranial large vessel occlusion or proximal high-grade arterial stenosis. 2. Intracranial atherosclerotic disease, as described. MRA neck: 1. Motion degradation and non-contrast technique limits evaluation of the aortic arch and proximal major branch vessels of the neck. 2. Within this limitation, the common carotid, internal carotid and vertebral arteries are patent within neck without hemodynamically significant stenosis. Mild atherosclerotic plaque within the bilateral carotid systems within the neck, as described. Electronically Signed   By: Kellie Simmering D.O.   On: 06/28/2021 18:07   MR ANGIO NECK WO CONTRAST  Result Date: 06/28/2021 CLINICAL DATA:  Mental status change, unknown cause. EXAM: MRI HEAD WITHOUT CONTRAST MRA HEAD WITHOUT CONTRAST MRA NECK WITHOUT CONTRAST TECHNIQUE: Multiplanar, multi-echo pulse sequences of the brain and surrounding structures were acquired without intravenous contrast. Angiographic images of the Circle of Willis were acquired using MRA technique without intravenous contrast. Angiographic images of the neck were acquired using MRA technique without intravenous contrast. Carotid stenosis measurements (when applicable) are obtained utilizing NASCET criteria, using the distal internal carotid diameter as the denominator. COMPARISON:  Noncontrast head CT and CT angiogram head/neck performed earlier today 06/28/2021. FINDINGS: MRI HEAD FINDINGS Brain: Mild intermittent motion  degradation. Mild generalized cerebral and cerebellar atrophy. There are fairly numerous scattered subcentimeter acute infarcts within the bilateral cerebral hemispheres, right basal ganglia, posterior limb of right internal capsule, left thalamus and within the bilateral cerebellar hemispheres. Involvement of multiple vascular territories raises suspicion for an embolic process. Background minimal multifocal T2 FLAIR hyperintense signal abnormality within the cerebral white matter, nonspecific but compatible with chronic small vessel ischemic disease. 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 There are a few nonspecific supratentorial chronic parenchymal microhemorrhages. No  evidence of an intracranial mass. No extra-axial fluid collection. No midline shift. Vascular: Maintained flow voids within the proximal large arterial vessels. Skull and upper cervical spine: No focal suspicious marrow lesion. Sinuses/Orbits: Visualized orbits show no acute finding. Bilateral ocular lens replacements. Mild mucosal thickening within the left frontal sinus and within a posterior left ethmoid air cell. MRA HEAD FINDINGS Mildly motion degraded exam. Anterior circulation: The intracranial internal carotid arteries are patent. Minimal atherosclerotic irregularity of both vessels. The M1 middle cerebral arteries are patent. No M2 proximal branch occlusion or high-grade proximal stenosis is identified. Mild atherosclerotic irregularity of the M2 and more distal MCA vessels, bilaterally. The anterior cerebral arteries are patent. No intracranial aneurysm is identified. Posterior circulation: The intracranial vertebral arteries are patent. The non dominant left vertebral artery is developmentally diminutive beyond the origin of the left PICA. The basilar artery is patent. The posterior cerebral arteries are patent. Posterior communicating arteries are present bilaterally. Anatomic variants: None  significant. MRA NECK FINDINGS Aortic arch: Motion degradation and noncontrast technique limits evaluation of the aortic arch and origins of the great vessels of the neck. Right carotid system: Within described limitations, the CCA and ICA are patent within the neck without hemodynamically significant stenosis (50% or greater). Mild atherosclerotic plaque within the proximal ICA. Left carotid system: Within described limitations, the CCA and ICA are patent within the neck without hemodynamically significant stenosis. Mild atherosclerotic plaque within the carotid bifurcation and proximal ICA. Vertebral arteries: Within described limitations, the vertebral arteries are patent within the neck without hemodynamically significant stenosis. The right vertebral artery is dominant. IMPRESSION: MRI brain: 1. Mildly motion degraded exam. 2. Fairly numerous subcentimeter acute infarcts within the bilateral cerebral hemispheres, right basal ganglia, posterior limb of right internal capsule, left thalamus and within the bilateral cerebellar hemispheres. Involvement of multiple vascular territories raises suspicion for an embolic process. 3. Background minimal chronic small-vessel ischemic changes within the cerebral white matter. 4. 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 5. Mild generalized parenchymal atrophy. 6. Mild paranasal sinus disease, as described. MRA head: 1. No intracranial large vessel occlusion or proximal high-grade arterial stenosis. 2. Intracranial atherosclerotic disease, as described. MRA neck: 1. Motion degradation and non-contrast technique limits evaluation of the aortic arch and proximal major branch vessels of the neck. 2. Within this limitation, the common carotid, internal carotid and vertebral arteries are patent within neck without hemodynamically significant stenosis. Mild atherosclerotic plaque within the bilateral carotid systems within the neck, as  described. Electronically Signed   By: Kellie Simmering D.O.   On: 06/28/2021 18:07   MR BRAIN WO CONTRAST  Result Date: 06/28/2021 CLINICAL DATA:  Mental status change, unknown cause. EXAM: MRI HEAD WITHOUT CONTRAST MRA HEAD WITHOUT CONTRAST MRA NECK WITHOUT CONTRAST TECHNIQUE: Multiplanar, multi-echo pulse sequences of the brain and surrounding structures were acquired without intravenous contrast. Angiographic images of the Circle of Willis were acquired using MRA technique without intravenous contrast. Angiographic images of the neck were acquired using MRA technique without intravenous contrast. Carotid stenosis measurements (when applicable) are obtained utilizing NASCET criteria, using the distal internal carotid diameter as the denominator. COMPARISON:  Noncontrast head CT and CT angiogram head/neck performed earlier today 06/28/2021. FINDINGS: MRI HEAD FINDINGS Brain: Mild intermittent motion degradation. Mild generalized cerebral and cerebellar atrophy. There are fairly numerous scattered subcentimeter acute infarcts within the bilateral cerebral hemispheres, right basal ganglia, posterior limb of right internal capsule, left thalamus and within the bilateral cerebellar hemispheres. Involvement of  multiple vascular territories raises suspicion for an embolic process. Background minimal multifocal T2 FLAIR hyperintense signal abnormality within the cerebral white matter, nonspecific but compatible with chronic small vessel ischemic disease. 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 There are a few nonspecific supratentorial chronic parenchymal microhemorrhages. No evidence of an intracranial mass. No extra-axial fluid collection. No midline shift. Vascular: Maintained flow voids within the proximal large arterial vessels. Skull and upper cervical spine: No focal suspicious marrow lesion. Sinuses/Orbits: Visualized orbits show no acute finding. Bilateral ocular lens  replacements. Mild mucosal thickening within the left frontal sinus and within a posterior left ethmoid air cell. MRA HEAD FINDINGS Mildly motion degraded exam. Anterior circulation: The intracranial internal carotid arteries are patent. Minimal atherosclerotic irregularity of both vessels. The M1 middle cerebral arteries are patent. No M2 proximal branch occlusion or high-grade proximal stenosis is identified. Mild atherosclerotic irregularity of the M2 and more distal MCA vessels, bilaterally. The anterior cerebral arteries are patent. No intracranial aneurysm is identified. Posterior circulation: The intracranial vertebral arteries are patent. The non dominant left vertebral artery is developmentally diminutive beyond the origin of the left PICA. The basilar artery is patent. The posterior cerebral arteries are patent. Posterior communicating arteries are present bilaterally. Anatomic variants: None significant. MRA NECK FINDINGS Aortic arch: Motion degradation and noncontrast technique limits evaluation of the aortic arch and origins of the great vessels of the neck. Right carotid system: Within described limitations, the CCA and ICA are patent within the neck without hemodynamically significant stenosis (50% or greater). Mild atherosclerotic plaque within the proximal ICA. Left carotid system: Within described limitations, the CCA and ICA are patent within the neck without hemodynamically significant stenosis. Mild atherosclerotic plaque within the carotid bifurcation and proximal ICA. Vertebral arteries: Within described limitations, the vertebral arteries are patent within the neck without hemodynamically significant stenosis. The right vertebral artery is dominant. IMPRESSION: MRI brain: 1. Mildly motion degraded exam. 2. Fairly numerous subcentimeter acute infarcts within the bilateral cerebral hemispheres, right basal ganglia, posterior limb of right internal capsule, left thalamus and within the bilateral  cerebellar hemispheres. Involvement of multiple vascular territories raises suspicion for an embolic process. 3. Background minimal chronic small-vessel ischemic changes within the cerebral white matter. 4. 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 5. Mild generalized parenchymal atrophy. 6. Mild paranasal sinus disease, as described. MRA head: 1. No intracranial large vessel occlusion or proximal high-grade arterial stenosis. 2. Intracranial atherosclerotic disease, as described. MRA neck: 1. Motion degradation and non-contrast technique limits evaluation of the aortic arch and proximal major branch vessels of the neck. 2. Within this limitation, the common carotid, internal carotid and vertebral arteries are patent within neck without hemodynamically significant stenosis. Mild atherosclerotic plaque within the bilateral carotid systems within the neck, as described. Electronically Signed   By: Kellie Simmering D.O.   On: 06/28/2021 18:07   CT C-SPINE NO CHARGE  Result Date: 06/28/2021 CLINICAL DATA:  81 year old female with fall. EXAM: CT CERVICAL SPINE WITHOUT CONTRAST TECHNIQUE: Multidetector CT imaging of the cervical spine was performed without intravenous contrast. Multiplanar CT image reconstructions were also generated. RADIATION DOSE REDUCTION: This exam was performed according to the departmental dose-optimization program which includes automated exposure control, adjustment of the mA and/or kV according to patient size and/or use of iterative reconstruction technique. COMPARISON:  None. FINDINGS: Alignment: Normal. Skull base and vertebrae: No acute fracture. No primary bone lesion or focal pathologic process. Soft tissues and spinal  canal: No prevertebral fluid or swelling. No visible canal hematoma. Disc levels: Mild multilevel degenerative disc disease/spondylosis and mild to moderate multilevel facet arthropathy noted. These findings contribute to mild bony RIGHT  foraminal narrowing at C3-4. Upper chest: No acute abnormality Other: None IMPRESSION: 1. No static evidence of acute injury to the cervical spine. 2. Mild multilevel degenerative disc disease/spondylosis and mild to moderate multilevel facet arthropathy. CTA head/neck is dictated in a separate report. Electronically Signed   By: Margarette Canada M.D.   On: 06/28/2021 13:25   DG Chest Portable 1 View  Result Date: 06/28/2021 CLINICAL DATA:  Fall EXAM: PORTABLE CHEST 1 VIEW COMPARISON:  None. FINDINGS: Lungs are clear. No pneumothorax or pleural effusion. Cardiac size is mildly enlarged. Prominent calcification of the mitral valve annulus noted. Rounded opacity within the right perihilar region represents vascular shadow, better assessed on prior CT examination of 07/28/2006. Pulmonary vascularity is otherwise normal. No acute bone abnormality. IMPRESSION: No radiographic evidence of acute cardiopulmonary disease. Mild cardiomegaly Electronically Signed   By: Fidela Salisbury M.D.   On: 06/28/2021 19:41   DG Knee Complete 4 Views Left  Result Date: 06/28/2021 CLINICAL DATA:  Status post fall.  Left arm pain.  Left knee pain. EXAM: LEFT KNEE - COMPLETE 4+ VIEW COMPARISON:  None. FINDINGS: No acute fracture or dislocation. No aggressive osseous lesion. Normal alignment. Generalized osteopenia. Moderate left lateral femorotibial compartment joint space narrowing with marginal osteophytes. Mild medial femorotibial compartment joint space narrowing marginal osteophytes. Severe soft tissue swelling along the anterior aspect of the knee. No radiopaque foreign body or soft tissue emphysema. IMPRESSION: 1. No acute osseous injury of the left knee. 2. Severe soft tissue swelling along the anterior aspect of the knee consistent with soft tissue contusion. Electronically Signed   By: Kathreen Devoid M.D.   On: 06/28/2021 12:53   ECHOCARDIOGRAM COMPLETE  Result Date: 06/29/2021    ECHOCARDIOGRAM REPORT   Patient Name:   ROSINA CRESSLER Date of Exam: 06/29/2021 Medical Rec #:  [id number]              Height:       71.0 in Accession #:    [id number]             Weight:       195.0 lb Date of Birth:  1940-12-03               BSA:          2.086 m Patient Age:    40 years               BP:           113/98 mmHg Patient Gender: F                      HR:           72 bpm. Exam Location:  Inpatient Procedure: 2D Echo, Cardiac Doppler and Color Doppler Indications:    Stroke I63.9  History:        Patient has prior history of Echocardiogram examinations, most                 recent 12/29/2015. CHF, COPD, Stroke and TIA; Arrythmias:Atrial                 Fibrillation and Atrial Flutter.  Sonographer:    Darlina Sicilian RDCS Referring Phys: Louise  1. Left ventricular ejection fraction, by  estimation, is 60 to 65%. The left ventricle has normal function. The left ventricle has no regional wall motion abnormalities. The left ventricular internal cavity size was mildly dilated. Left ventricular diastolic parameters are consistent with Grade I diastolic dysfunction (impaired relaxation).  2. Right ventricular systolic function is normal. The right ventricular size is normal. There is normal pulmonary artery systolic pressure.  3. Left atrial size was moderately dilated.  4. The mitral valve is normal in structure. No evidence of mitral valve regurgitation. No evidence of mitral stenosis. Moderate mitral annular calcification.  5. The aortic valve is normal in structure. Aortic valve regurgitation is not visualized. No aortic stenosis is present.  6. The inferior vena cava is normal in size with greater than 50% respiratory variability, suggesting right atrial pressure of 3 mmHg. Conclusion(s)/Recommendation(s): No intracardiac source of embolism detected on this transthoracic study. Consider a transesophageal echocardiogram to exclude cardiac source of embolism if clinically indicated. FINDINGS  Left Ventricle: Left  ventricular ejection fraction, by estimation, is 60 to 65%. The left ventricle has normal function. The left ventricle has no regional wall motion abnormalities. The left ventricular internal cavity size was mildly dilated. There is  no left ventricular hypertrophy. Left ventricular diastolic parameters are consistent with Grade I diastolic dysfunction (impaired relaxation). Right Ventricle: The right ventricular size is normal. No increase in right ventricular wall thickness. Right ventricular systolic function is normal. There is normal pulmonary artery systolic pressure. The tricuspid regurgitant velocity is 2.37 m/s, and  with an assumed right atrial pressure of 8 mmHg, the estimated right ventricular systolic pressure is 38.8 mmHg. Left Atrium: Left atrial size was moderately dilated. Right Atrium: Right atrial size was normal in size. Pericardium: There is no evidence of pericardial effusion. Mitral Valve: The mitral valve is normal in structure. There is moderate thickening of the mitral valve leaflet(s). There is moderate calcification of the mitral valve leaflet(s). Moderate mitral annular calcification. No evidence of mitral valve regurgitation. No evidence of mitral valve stenosis. MV peak gradient, 11.7 mmHg. The mean mitral valve gradient is 4.0 mmHg. Tricuspid Valve: The tricuspid valve is normal in structure. Tricuspid valve regurgitation is mild . No evidence of tricuspid stenosis. Aortic Valve: The aortic valve is normal in structure. Aortic valve regurgitation is not visualized. No aortic stenosis is present. Pulmonic Valve: The pulmonic valve was normal in structure. Pulmonic valve regurgitation is not visualized. No evidence of pulmonic stenosis. Aorta: The aortic root is normal in size and structure. Venous: The inferior vena cava is normal in size with greater than 50% respiratory variability, suggesting right atrial pressure of 3 mmHg. IAS/Shunts: No atrial level shunt detected by color flow  Doppler.  LEFT VENTRICLE PLAX 2D LVIDd:         5.60 cm   Diastology LVIDs:         3.10 cm   LV e' medial:    4.21 cm/s LV PW:         1.00 cm   LV E/e' medial:  31.4 LV IVS:        0.90 cm   LV e' lateral:   6.13 cm/s LVOT diam:     2.00 cm   LV E/e' lateral: 21.5 LV SV:         74 LV SV Index:   36 LVOT Area:     3.14 cm  RIGHT VENTRICLE RV S prime:     18.30 cm/s TAPSE (M-mode): 1.9 cm LEFT ATRIUM  Index        RIGHT ATRIUM           Index LA diam:        5.00 cm 2.40 cm/m   RA Area:     15.90 cm LA Vol (A2C):   93.1 ml 44.63 ml/m  RA Volume:   37.50 ml  17.98 ml/m LA Vol (A4C):   85.9 ml 41.18 ml/m LA Biplane Vol: 89.7 ml 43.00 ml/m  AORTIC VALVE LVOT Vmax:   162.00 cm/s LVOT Vmean:  81.300 cm/s LVOT VTI:    0.236 m  AORTA Ao Root diam: 2.90 cm Ao Asc diam:  3.10 cm MITRAL VALVE                TRICUSPID VALVE MV Area (PHT): 1.82 cm     TR Peak grad:   22.5 mmHg MV Area VTI:   2.10 cm     TR Vmax:        237.00 cm/s MV Peak grad:  11.7 mmHg MV Mean grad:  4.0 mmHg     SHUNTS MV Vmax:       1.71 m/s     Systemic VTI:  0.24 m MV Vmean:      101.0 cm/s   Systemic Diam: 2.00 cm MV Decel Time: 416 msec MV E velocity: 132.00 cm/s MV A velocity: 169.00 cm/s MV E/A ratio:  0.78 Candee Furbish MD Electronically signed by Candee Furbish MD Signature Date/Time: 06/29/2021/3:32:31 PM    Final     Microbiology: Results for orders placed or performed during the hospital encounter of 06/28/21  Resp Panel by RT-PCR (Flu A&B, Covid) Nasopharyngeal Swab     Status: None   Collection Time: 06/28/21 11:55 AM   Specimen: Nasopharyngeal Swab; Nasopharyngeal(NP) swabs in vial transport medium  Result Value Ref Range Status   SARS Coronavirus 2 by RT PCR NEGATIVE NEGATIVE Final    Comment: (NOTE) SARS-CoV-2 target nucleic acids are NOT DETECTED.  The SARS-CoV-2 RNA is generally detectable in upper respiratory specimens during the acute phase of infection. The lowest concentration of SARS-CoV-2 viral copies  this assay can detect is 138 copies/mL. A negative result does not preclude SARS-Cov-2 infection and should not be used as the sole basis for treatment or other patient management decisions. A negative result may occur with  improper specimen collection/handling, submission of specimen other than nasopharyngeal swab, presence of viral mutation(s) within the areas targeted by this assay, and inadequate number of viral copies(<138 copies/mL). A negative result must be combined with clinical observations, patient history, and epidemiological information. The expected result is Negative.  Fact Sheet for Patients:  EntrepreneurPulse.com.au  Fact Sheet for Healthcare Providers:  IncredibleEmployment.be  This test is no t yet approved or cleared by the [place] FDA and  has been authorized for detection and/or diagnosis of SARS-CoV-2 by FDA under an Emergency Use Authorization (EUA). This EUA will remain  in effect (meaning this test can be used) for the duration of the COVID-19 declaration under Section 564(b)(1) of the Act, 21 U.S.C.section 360bbb-3(b)(1), unless the authorization is terminated  or revoked sooner.       Influenza A by PCR NEGATIVE NEGATIVE Final   Influenza B by PCR NEGATIVE NEGATIVE Final    Comment: (NOTE) The Xpert Xpress SARS-CoV-2/FLU/RSV plus assay is intended as an aid in the diagnosis of influenza from Nasopharyngeal swab specimens and should not be used as a sole basis for treatment. Nasal washings and aspirates are unacceptable for  Xpert Xpress SARS-CoV-2/FLU/RSV testing.  Fact Sheet for Patients: EntrepreneurPulse.com.au  Fact Sheet for Healthcare Providers: IncredibleEmployment.be  This test is not yet approved or cleared by the [place] FDA and has been authorized for detection and/or diagnosis of SARS-CoV-2 by FDA under an Emergency Use Authorization (EUA). This EUA  will remain in effect (meaning this test can be used) for the duration of the COVID-19 declaration under Section 564(b)(1) of the Act, 21 U.S.C. section 360bbb-3(b)(1), unless the authorization is terminated or revoked.  Performed at [facility], [place] [address], [place], [place] [address]     Labs: CBC: Recent Labs  Lab 06/28/21 1126 06/28/21 1214  WBC 9.2  --   NEUTROABS 6.8  --   HGB 13.5 14.3  HCT 42.0 42.0  MCV 92.3  --   PLT 285  --    Basic Metabolic Panel: Recent Labs  Lab 06/28/21 1126 06/28/21 1214 06/29/21 0230  NA 140 141  --   K 3.6 3.6  --   CL 105 102  --   CO2 24  --   --   GLUCOSE 113* 113*  --   BUN 6* 8  --   CREATININE 0.87 0.80  --   CALCIUM 9.3  --   --   MG  --   --  1.8  PHOS  --   --  3.3   Liver Function Tests: Recent Labs  Lab 06/28/21 1126 06/29/21 0230  AST 36 30  ALT 15 15  ALKPHOS 59 56  BILITOT 0.7 0.7  PROT 6.7 6.5  ALBUMIN 3.5 3.2*   CBG: No results for input(s): GLUCAP in the last 168 hours.  Discharge time spent: greater than 30 minutes.  Signed: Hosie Poisson, MD [facility] 07/01/2021

## 2021-07-01 NOTE — TOC Transition Note (Signed)
Transition of Care [facility]) - CM/SW Discharge Note   Patient Details  Name: Laura Mendez MRN: [id number] Date of Birth: [birth year]  [facility] CM/SW Contact:  Pollie Friar, RN Phone Number: 07/01/2021, 10:47 AM   Clinical Narrative:    Patient is discharging to CIR today. CM signing off.    Final next level of care: IP Rehab Facility Barriers to Discharge: No Barriers Identified   Patient Goals and CMS Choice     Choice offered to / list presented to : Patient  Discharge Placement                       Discharge Plan and Services                                     Social Determinants of Health (SDOH) Interventions     Readmission Risk Interventions No flowsheet data found.

## 2021-07-01 NOTE — H&P (Incomplete)
Physical Medicine and Rehabilitation Admission H&P    Chief Complaint  Patient presents with   Stroke Symptoms    Decreased sensation to L arm, slurred speech   Fall    W/ skin tear to L arm  : HPI: Laura Mendez is an 81 year old right-handed female with history of TIA, atrial fibrillation maintained on Xarelto followed by Dr. Marlou Porch, COPD followed by Dr. Annamaria Boots, diastolic congestive heart failure, remote history of right breast cancer with lumpectomy radioactive seed localization 2019 followed by Dr. Lindi Adie, quit smoking 44 years ago.  Per chart review patient lives alone.  Two-level home.  Independent prior to admission. Good family support. Presented 06/28/2021 after a fall while walking from the foot of her bed striking her left knee with noted left-sided weakness and slurred speech.  MRI showed acute infarct within bilateral cerebral hemispheres, right basal ganglia, posterior limb of right internal capsule, left thalamus and within the bilateral cerebellar hemispheres.  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  MRA of head showed no large vessel occlusion or high-grade stenosis.  MRA of the neck without hemodynamically significant stenosis.  CT of the left knee showed small joint effusion as well as tricompartmental degenerative osteoarthritis changes mostly pronounced at the lateral compartment.  Admission chemistries unremarkable except glucose 113, urine drug screen negative, urinalysis negative nitrite, CK 236.  Echocardiogram with ejection fraction of 60 to 65% no wall motion abnormalities grade 1 diastolic dysfunction.  Neurology follow-up and she was transition from [facility] to [facility].  Tolerating a regular diet.  Therapy evaluations completed due to patient's left-sided weakness was admitted for a comprehensive rehab program.  Review of Systems  Constitutional:  Negative for chills and fever.  HENT:  Negative for hearing loss.   Eyes:  Negative for blurred vision  and double vision.  Respiratory:  Negative for cough and shortness of breath.   Cardiovascular:  Positive for palpitations. Negative for chest pain and leg swelling.  Gastrointestinal:  Positive for constipation. Negative for heartburn, nausea and vomiting.  Genitourinary:  Negative for dysuria, flank pain and hematuria.  Musculoskeletal:  Positive for joint pain and myalgias.  Skin:  Negative for rash.  Neurological:  Positive for speech change and weakness.  All other systems reviewed and are negative. Past Medical History:  Diagnosis Date   Asthma    Chronic airway obstruction, not elsewhere classified    Diastolic dysfunction without heart failure    Disorder of bone and cartilage, unspecified    Dysrhythmia    A-Fib   Family history of colon cancer    Family history of kidney cancer    Family history of ovarian cancer    Paroxysmal atrial flutter (HCC)    Stroke ([place])    TIA   TIA (transient ischemic attack)    Unspecified asthma(493.90)    Past Surgical History:  Procedure Laterality Date   ABDOMINAL HYSTERECTOMY     BREAST LUMPECTOMY Right 12/30/2017   BREAST LUMPECTOMY WITH RADIOACTIVE SEED LOCALIZATION Right 12/30/2017   Procedure: BREAST LUMPECTOMY WITH RADIOACTIVE SEED LOCALIZATION X'S 3;  Surgeon: Rolm Bookbinder, MD;  Location: [facility];  Service: General;  Laterality: Right;   BUNIONECTOMY Bilateral    EYE SURGERY Bilateral    cataract removal   Family History  Problem Relation Age of Onset   COPD Father    Colon cancer Mother 57       mets to pancreas and liver   Kidney cancer Sister 76  d. 4   Lung cancer Sister        d. 42, smoker   Ovarian cancer Sister 72       d. 47   Cirrhosis Sister        d. 70   Melanoma Sister 15   Ovarian cancer Maternal Grandmother        d. 62   Heart disease Maternal Grandfather        rheumatic heart disease   Rectal cancer Paternal Grandfather        d. 81   Parkinson's disease Brother    Ovarian cancer  Maternal Aunt    Colon cancer Maternal Uncle        d. 72-73   Heart attack Paternal Uncle        d. 29   Kidney cancer Brother 36   Colon cancer Maternal Uncle        d. 24   Cancer Other        MGMs pat 1/2 sister with uterine and rectal cancer   Colon cancer Cousin        d. 21 - mat first cousin   Breast cancer Neg Hx    Social History:  reports that she quit smoking about 44 years ago. Her smoking use included cigarettes. She has never used smokeless tobacco. She reports that she does not drink alcohol and does not use drugs. Allergies:  Allergies  Allergen Reactions   Clarithromycin Anaphylaxis    "just about killed me"   Ibuprofen Hives   Codeine Nausea Only   Codeine Phosphate Nausea Only   Diltiazem Rash   Tape Itching and Other (See Comments)    Tears skin   Tapentadol Itching    Other reaction(s): Other (See Comments) Tears skin   Verapamil Hcl Er Rash   Facility-Administered Medications Prior to Admission  Medication Dose Route Frequency Provider Last Rate Last Admin   Mepolizumab SOLR 100 mg  100 mg Subcutaneous Q28 days Annamaria Boots, Clinton D, MD   100 mg at 12/20/17 1455   Medications Prior to Admission  Medication Sig Dispense Refill   albuterol (VENTOLIN HFA) 108 (90 Base) MCG/ACT inhaler INHALE 2 PUFFS INTO LUNGS EVERY 4 HOURS AS NEEDED 18 g 5   cholecalciferol (VITAMIN D) 1000 units tablet Take 1,000 Units by mouth daily.     clobetasol cream (TEMOVATE) 5.17 % Apply 1 application topically as needed (rash).     DUPIXENT 300 MG/2ML SOPN Inject 2 mLs into the skin every 30 (thirty) days.     fish oil-omega-3 fatty acids 1000 MG capsule Take 1 g by mouth daily.     Fluticasone-Salmeterol (WIXELA INHUB) 250-50 MCG/DOSE AEPB Inhale 1 puff into the lungs 2 (two) times daily. 60 each 10   ipratropium-albuterol (DUONEB) 0.5-2.5 (3) MG/3ML SOLN Take 3 mLs by nebulization every 6 (six) hours as needed. 75 mL 12   letrozole (FEMARA) 2.5 MG tablet Take 1 tablet by mouth  once daily (Patient taking differently: Take 2.5 mg by mouth at bedtime.) 90 tablet 3   Multiple Vitamin (MULTIVITAMIN) tablet Take 1 tablet by mouth daily.     rivaroxaban (XARELTO) 20 MG TABS tablet TAKE 1 TABLET BY MOUTH ONCE DAILY WITH SUPPER 90 tablet 1   triamcinolone ointment (KENALOG) 0.1 % Apply 1 application topically 2 (two) times daily as needed (for itching).     hydrOXYzine (ATARAX) 10 MG tablet Take 10-20 mg by mouth at bedtime. (Patient not taking: Reported on 06/28/2021)  MEGARED OMEGA-3 KRILL OIL 500 MG CAPS Take 500 mg by mouth daily.  (Patient not taking: Reported on 06/28/2021)     molnupiravir EUA (LAGEVRIO) 200 mg CAPS capsule Take 4 capsules (800 mg total) by mouth 2 (two) times daily for 5 days. (Patient not taking: Reported on 06/28/2021) 40 capsule 0   predniSONE (STERAPRED UNI-PAK 21 TAB) 10 MG (21) TBPK tablet Take by mouth daily. Take 6 tabs by mouth daily  for 2 days, then 5 tabs for 2 days, then 4 tabs for 2 days, then 3 tabs for 2 days, 2 tabs for 2 days, then 1 tab by mouth daily for 2 days (Patient not taking: Reported on 06/28/2021) 42 tablet 0      Home: Home Living Family/patient expects to be discharged to:: Private residence Living Arrangements: Alone Available Help at Discharge: Family, Available 24 hours/day Type of Home:  (townhome) Home Access: Stairs to enter [employer] of Steps: 2 (2 curb steps) Entrance Stairs-Rails: Right Home Layout: Two level Alternate Level Stairs-Number of Steps: flight Alternate Level Stairs-Rails: Left, Right Bathroom Shower/Tub: Gaffer, Chiropodist: Standard Home Equipment: Grab bars - tub/shower, Civil engineer, contracting, Radio producer - single point, Conservation officer, nature (2 wheels)  Lives With: Alone   Functional History: Prior Function Prior Level of Function : Independent/Modified Independent, Driving  Functional Status:  Mobility: Bed Mobility Overal bed mobility: Needs Assistance Bed Mobility:  Supine to Sit Supine to sit: Min assist, +2 for safety/equipment, HOB elevated Sit to supine: Max assist, +2 for physical assistance General bed mobility comments: Min assist with cues for bringing LE's off EOB and to reach for bed rail. Assist to fully raise trunk upright. pt steady once sitting EOB. Transfers Overall transfer level: Needs assistance Equipment used: Rolling walker (2 wheels) Transfers: Sit to/from Stand, Bed to chair/wheelchair/BSC Sit to Stand: Mod assist, +2 safety/equipment Bed to/from chair/wheelchair/BSC transfer type:: Step pivot Step pivot transfers: Mod assist, +2 safety/equipment General transfer comment: Mod Assist to power up from EOB with cues needed for using bil UE's on bed to initiate. Pt with reduced strength in Lt UE and asisst to bring hand on RW, pt choosing to reduce weight on Lt LE in standing due to weakness and knee pain. Ambulation/Gait Ambulation/Gait assistance: Mod assist, +2 safety/equipment Gait Distance (Feet): 10 Feet Assistive device: Rolling walker (2 wheels) Gait Pattern/deviations: Step-to pattern, Decreased step length - right, Decreased step length - left, Decreased stance time - left, Decreased stride length, Decreased dorsiflexion - left, Decreased weight shift to left, Knee flexed in stance - left, Wide base of support General Gait Details: Mod assist for gait awith +2 for chair follow for safety. Manual facilitation of Lt elbow extension with Rt step to improve use of RW and blocking at Lt knee to prevent flexion in stance phase. as pt fatigued bil UE's becoming weaker and trunk more flexed. seated rest provided after short distance in room. Gait velocity: decr    ADL: ADL Overall ADL's : Needs assistance/impaired Grooming: Moderate assistance, Sitting Upper Body Dressing : Moderate assistance, Sitting Lower Body Dressing: Total assistance, +2 for physical assistance, +2 for safety/equipment, Sit to/from stand Toilet Transfer  Details (indicate cue type and reason): unable, mod assist +2 sit to stand Functional mobility during ADLs: Moderate assistance, +2 for physical assistance, +2 for safety/equipment  Cognition: Cognition Overall Cognitive Status: Impaired/Different from baseline Arousal/Alertness: Awake/alert Orientation Level: Oriented to place, Oriented to time, Oriented to situation, Oriented to person Attention: Selective Selective Attention:  Appears intact Memory: Impaired Memory Impairment: Retrieval deficit, Prospective memory Awareness: Impaired Awareness Impairment: Intellectual impairment Problem Solving: Impaired Problem Solving Impairment: Verbal basic Safety/Judgment: Impaired Cognition Arousal/Alertness: Awake/alert Behavior During Therapy: WFL for tasks assessed/performed Overall Cognitive Status: Impaired/Different from baseline Area of Impairment: Problem solving, Following commands, Safety/judgement Following Commands: Follows one step commands consistently, Follows multi-step commands consistently, Follows multi-step commands with increased time Safety/Judgement: Decreased awareness of safety Problem Solving: Slow processing, Difficulty sequencing, Requires verbal cues General Comments: pt is pleasant and aware of deficits. oriented to self, place, situation. eager to participate in therapy.  Physical Exam: Blood pressure 123/63, pulse 65, temperature 97.6 F (36.4 C), temperature source Oral, resp. rate 17, height 5\' 11"  (1.803 m), weight 88.5 kg, SpO2 93 %. Physical Exam Neurological:     Comments: Patient is alert.  Makes eye contact with examiner.  Mild dysarthria fully intelligible.  Follows commands.  Provides name and age.    No results found for this or any previous visit (from the past 48 hour(s)). ECHOCARDIOGRAM COMPLETE  Result Date: 06/29/2021    ECHOCARDIOGRAM REPORT   Patient Name:   SHELBIA SCINTO Date of Exam: 06/29/2021 Medical Rec #:  [id number]               Height:       71.0 in Accession #:    [id number]             Weight:       195.0 lb Date of Birth:  Apr 12, 1941               BSA:          2.086 m Patient Age:    63 years               BP:           113/98 mmHg Patient Gender: F                      HR:           72 bpm. Exam Location:  Inpatient Procedure: 2D Echo, Cardiac Doppler and Color Doppler Indications:    Stroke I63.9  History:        Patient has prior history of Echocardiogram examinations, most                 recent 12/29/2015. CHF, COPD, Stroke and TIA; Arrythmias:Atrial                 Fibrillation and Atrial Flutter.  Sonographer:    Darlina Sicilian RDCS Referring Phys: [place]  1. Left ventricular ejection fraction, by estimation, is 60 to 65%. The left ventricle has normal function. The left ventricle has no regional wall motion abnormalities. The left ventricular internal cavity size was mildly dilated. Left ventricular diastolic parameters are consistent with Grade I diastolic dysfunction (impaired relaxation).  2. Right ventricular systolic function is normal. The right ventricular size is normal. There is normal pulmonary artery systolic pressure.  3. Left atrial size was moderately dilated.  4. The mitral valve is normal in structure. No evidence of mitral valve regurgitation. No evidence of mitral stenosis. Moderate mitral annular calcification.  5. The aortic valve is normal in structure. Aortic valve regurgitation is not visualized. No aortic stenosis is present.  6. The inferior vena cava is normal in size with greater than 50% respiratory variability, suggesting right atrial pressure of 3 mmHg. Conclusion(s)/Recommendation(s): No intracardiac source  of embolism detected on this transthoracic study. Consider a transesophageal echocardiogram to exclude cardiac source of embolism if clinically indicated. FINDINGS  Left Ventricle: Left ventricular ejection fraction, by estimation, is 60 to 65%. The left ventricle  has normal function. The left ventricle has no regional wall motion abnormalities. The left ventricular internal cavity size was mildly dilated. There is  no left ventricular hypertrophy. Left ventricular diastolic parameters are consistent with Grade I diastolic dysfunction (impaired relaxation). Right Ventricle: The right ventricular size is normal. No increase in right ventricular wall thickness. Right ventricular systolic function is normal. There is normal pulmonary artery systolic pressure. The tricuspid regurgitant velocity is 2.37 m/s, and  with an assumed right atrial pressure of 8 mmHg, the estimated right ventricular systolic pressure is 90.3 mmHg. Left Atrium: Left atrial size was moderately dilated. Right Atrium: Right atrial size was normal in size. Pericardium: There is no evidence of pericardial effusion. Mitral Valve: The mitral valve is normal in structure. There is moderate thickening of the mitral valve leaflet(s). There is moderate calcification of the mitral valve leaflet(s). Moderate mitral annular calcification. No evidence of mitral valve regurgitation. No evidence of mitral valve stenosis. MV peak gradient, 11.7 mmHg. The mean mitral valve gradient is 4.0 mmHg. Tricuspid Valve: The tricuspid valve is normal in structure. Tricuspid valve regurgitation is mild . No evidence of tricuspid stenosis. Aortic Valve: The aortic valve is normal in structure. Aortic valve regurgitation is not visualized. No aortic stenosis is present. Pulmonic Valve: The pulmonic valve was normal in structure. Pulmonic valve regurgitation is not visualized. No evidence of pulmonic stenosis. Aorta: The aortic root is normal in size and structure. Venous: The inferior vena cava is normal in size with greater than 50% respiratory variability, suggesting right atrial pressure of 3 mmHg. IAS/Shunts: No atrial level shunt detected by color flow Doppler.  LEFT VENTRICLE PLAX 2D LVIDd:         5.60 cm   Diastology LVIDs:          3.10 cm   LV e' medial:    4.21 cm/s LV PW:         1.00 cm   LV E/e' medial:  31.4 LV IVS:        0.90 cm   LV e' lateral:   6.13 cm/s LVOT diam:     2.00 cm   LV E/e' lateral: 21.5 LV SV:         74 LV SV Index:   36 LVOT Area:     3.14 cm  RIGHT VENTRICLE RV S prime:     18.30 cm/s TAPSE (M-mode): 1.9 cm LEFT ATRIUM             Index        RIGHT ATRIUM           Index LA diam:        5.00 cm 2.40 cm/m   RA Area:     15.90 cm LA Vol (A2C):   93.1 ml 44.63 ml/m  RA Volume:   37.50 ml  17.98 ml/m LA Vol (A4C):   85.9 ml 41.18 ml/m LA Biplane Vol: 89.7 ml 43.00 ml/m  AORTIC VALVE LVOT Vmax:   162.00 cm/s LVOT Vmean:  81.300 cm/s LVOT VTI:    0.236 m  AORTA Ao Root diam: 2.90 cm Ao Asc diam:  3.10 cm MITRAL VALVE                TRICUSPID VALVE MV Area (  PHT): 1.82 cm     TR Peak grad:   22.5 mmHg MV Area VTI:   2.10 cm     TR Vmax:        237.00 cm/s MV Peak grad:  11.7 mmHg MV Mean grad:  4.0 mmHg     SHUNTS MV Vmax:       1.71 m/s     Systemic VTI:  0.24 m MV Vmean:      101.0 cm/s   Systemic Diam: 2.00 cm MV Decel Time: 416 msec MV E velocity: 132.00 cm/s MV A velocity: 169.00 cm/s MV E/A ratio:  0.78 Candee Furbish MD Electronically signed by Candee Furbish MD Signature Date/Time: 06/29/2021/3:32:31 PM    Final       Blood pressure 123/63, pulse 65, temperature 97.6 F (36.4 C), temperature source Oral, resp. rate 17, height 5\' 11"  (1.803 m), weight 88.5 kg, SpO2 93 %.  Medical Problem List and Plan: 1. Functional deficits secondary to acute infarct bilateral cerebral hemispheres, right basal ganglia posterior limb right internal capsule left thalamus/embolic shower likely due to embolism source with A-fib  -patient may *** shower  -ELOS/Goals: *** 2.  Antithrombotics: -DVT/anticoagulation:  Pharmaceutical: Other (comment) Eliquis  -antiplatelet therapy: N/A 3. Pain Management: Tylenol as needed 4. Mood: Provide emotional support  -antipsychotic agents: N/A 5. Neuropsych: This patient is  capable of making decisions on her own behalf. 6. Skin/Wound Care: Routine skin checks 7. Fluids/Electrolytes/Nutrition: Routine in and outs with follow-up chemistries 8.  Hyperlipidemia.  Lipitor 9.  Permissive hypertension.  Monitor with increased mobility 10.  History of breast cancer.  Continue Femara 11.  COPD/asthma.  Monitor oxygen saturations every shift.  Continue nebulizer as needed.  Follow-up Dr. Annamaria Boots 12.  Atrial fibrillation.  Cardiac rate controlled.  Continue Eliquis.  Follow-up Dr. Marlou Porch 13.  Diastolic congestive heart failure.  Monitor for any signs of fluid overload    Cathlyn Parsons, PA-C 07/01/2021

## 2021-07-01 NOTE — Progress Notes (Signed)
Inpatient Rehabilitation Admission Medication Review by a Pharmacist  A complete drug regimen review was completed for this patient to identify any potential clinically significant medication issues.  High Risk Drug Classes Is patient taking? Indication by Medication  Antipsychotic No   Anticoagulant Yes Eliquis for afib/flutter s/p CVA  Antibiotic No   Opioid No   Antiplatelet No   Hypoglycemics/insulin No   Vasoactive Medication No   Chemotherapy No   Other Yes Letrozole for Malignant neoplasm of lower-outer quadrant of right breast of female, estrogen receptor positive      Type of Medication Issue Identified Description of Issue Recommendation(s)  Drug Interaction(s) (clinically significant)     Duplicate Therapy     Allergy     No Medication Administration End Date     Incorrect Dose     Additional Drug Therapy Needed  Duonebs prn, Dupixent, Vit D, MV Reorder meds as needed Ask pt to bring in [place] if still here when due on 2/27.  Significant med changes from prior encounter (inform family/care partners about these prior to discharge).    Other       Clinically significant medication issues were identified that warrant physician communication and completion of prescribed/recommended actions by midnight of the next day:  [facility]  Name of provider notified for urgent issues identified: Marlowe Shores, PA  Provider Method of Notification: chat  Pharmacist comments: May we resume home COPD maintenance inhaler?>>>Yes!   Time spent performing this drug regimen review (minutes): 75min  Khadeejah Castner S. Alford Highland, PharmD, BCPS Clinical Staff Pharmacist [email or web address] [place] 07/01/2021 2:13 PM

## 2021-07-01 NOTE — Progress Notes (Signed)
Mottinger, Tessie Fass, PT  Physical Therapist Physical Therapy Progress Notes    Signed Date of Service:  07/01/2021  1:31 PM  Related encounter: ED to Hosp-Admission (Discharged) from 06/28/2021 in [facility]   Signed      Show:Clear all [x] Written[x] Templated[] Copied  Added by: [x] Mottinger, Tessie Fass, PT  [] Hover for details                                                                                                                Physical Therapy Treatment Patient Details Name: Laura Mendez MRN: [id number] DOB: 1941-04-30 Today's Date: 07/01/2021     History of Present Illness Pt is a 81 y/o female presenting on 2/5 after fall due to L sided weakness, speech difficulty. 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 PMH includes: TIA, asthma, afib, breast CA.      PT Comments      Emphasis on transitions and transfer safety/technique.  Worked on pre-gait over 4 trials and progressed gait for stability and quality.    Recommendations for follow up therapy are one component of a multi-disciplinary discharge planning process, led by the attending physician.  Recommendations may be updated based on patient status, additional functional criteria and insurance authorization.   Follow Up Recommendations   Acute inpatient rehab (3hours/day)      Assistance Recommended at Discharge Frequent or constant Supervision/Assistance  Patient can return home with the following Two people to help with walking and/or transfers;Two people to help with bathing/dressing/bathroom    Equipment Recommendations   Wheelchair cushion (measurements PT);Wheelchair (measurements PT)     Recommendations for Other Services Rehab consult        Precautions / Restrictions Precautions Precautions: Fall        Mobility   Bed Mobility Overal bed mobility: Needs Assistance Bed Mobility: Supine to Sit Supine to sit: Min assist General bed mobility comments: cues for normalized movement,  could not remember the multistep command to get to EOB.  minimal truncal assist up to L elbow.   Transfers Overall transfer level: Needs assistance Equipment used: Rolling walker (2 wheels) Transfers: Sit to/from Stand, Bed to chair/wheelchair/BSC Step pivot transfers: Mod assist, +2 safety/equipment General transfer comment: cues for technique and hand placement.  assist forward, stability and boosting  x4 into RW for pre gait.   Ambulation/Gait Ambulation/Gait assistance: Mod assist, +2 physical assistance, +2 safety/equipment Gait Distance (Feet): 12 Feet Assistive device: Rolling walker (2 wheels) Gait Pattern/deviations: Step-to pattern, Step-through pattern Gait velocity interpretation: <1.31 ft/sec, indicative of household ambulator General Gait Details: paretic gait pattern L LE.  Work on upright posture, control of L knee and heel/toe pattern with stability and w/shift assist.     Stairs     Wheelchair Mobility   Modified Rankin (Stroke Patients Only) Modified Rankin (Stroke Patients Only) Pre-Morbid Rankin Score: No symptoms Modified Rankin: Moderately severe disability  Balance Overall balance assessment: Needs assistance Sitting-balance support: No upper extremity supported, Feet supported Sitting balance-Leahy Scale: Fair Standing balance support: Bilateral upper extremity supported, During functional activity, Reliant on assistive device for balance Standing balance-Leahy Scale: Poor Standing balance comment: Reliant on BUE and external support      Cognition Arousal/Alertness: Awake/alert Behavior During Therapy: WFL for tasks assessed/performed Overall Cognitive Status:  (NT formally today.  follow commands well, mild L inattention)      Exercises      General  Comments General comments (skin integrity, edema, etc.): son present        Pertinent Vitals/Pain       Home Living       Prior Function        PT Goals (current goals can now be found in the care plan section) Acute Rehab PT Goals Patient Stated Goal: to get stronger before going home PT Goal Formulation: With patient Time For Goal Achievement: 07/13/21 Potential to Achieve Goals: Good Progress towards PT goals: Progressing toward goals      Frequency       Min 4X/week          PT Plan Current plan remains appropriate      Co-evaluation      AM-PAC PT "6 Clicks" Mobility   Outcome Measure   Help needed turning from your back to your side while in a flat bed without using bedrails?: A Little Help needed moving from lying on your back to sitting on the side of a flat bed without using bedrails?: A Little Help needed moving to and from a bed to a chair (including a wheelchair)?: A Lot Help needed standing up from a chair using your arms (e.g., wheelchair or bedside chair)?: A Lot Help needed to walk in hospital room?: A Lot Help needed climbing 3-5 steps with a railing? : Total 6 Click Score: 13      End of Session Equipment Utilized During Treatment: Gait belt Activity Tolerance: Patient tolerated treatment well Patient left: in chair;with call bell/phone within reach;with chair alarm set;with family/visitor present Nurse Communication: Mobility status PT Visit Diagnosis: Muscle weakness (generalized) (M62.81);Unsteadiness on feet (R26.81);Hemiplegia and hemiparesis Hemiplegia - Right/Left: Left Hemiplegia - dominant/non-dominant: Non-dominant Hemiplegia - caused by: Cerebral infarction        Time: 6767-2094 PT Time Calculation (min) (ACUTE ONLY): 23 min   Charges:  $Gait Training: 8-22 mins $Therapeutic Activity: 8-22 mins                                                                                                                            07/01/2021    Ginger Carne., PT Acute Rehabilitation Services [phone]  (pager) [phone]  (office)     Tessie Fass Mottinger 07/01/2021, 1:31 PM           Note Details  Author Mottinger, Tessie Fass, PT File Time 07/01/2021  1:33 PM  Author Type Physical Therapist Status Signed  Last  Editor Mottinger, Tessie Fass, [facility] # [id number] Admit Date 07/01/2021

## 2021-07-01 NOTE — Progress Notes (Signed)
Physical Therapy Treatment Patient Details Name: Laura Mendez MRN: [id number] DOB: Mar 19, 1941 Today's Date: 07/01/2021   History of Present Illness Pt is a 81 y/o female presenting on 2/5 after fall due to L sided weakness, speech difficulty. 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 PMH includes: TIA, asthma, afib, breast CA.    PT Comments    Emphasis on transitions and transfer safety/technique.  Worked on pre-gait over 4 trials and progressed gait for stability and quality.    Recommendations for follow up therapy are one component of a multi-disciplinary discharge planning process, led by the attending physician.  Recommendations may be updated based on patient status, additional functional criteria and insurance authorization.  Follow Up Recommendations  Acute inpatient rehab (3hours/day)     Assistance Recommended at Discharge Frequent or constant Supervision/Assistance  Patient can return home with the following Two people to help with walking and/or transfers;Two people to help with bathing/dressing/bathroom   Equipment Recommendations  Wheelchair cushion (measurements PT);Wheelchair (measurements PT)    Recommendations for Other Services Rehab consult     Precautions / Restrictions Precautions Precautions: Fall     Mobility  Bed Mobility Overal bed mobility: Needs Assistance Bed Mobility: Supine to Sit     Supine to sit: Min assist     General bed mobility comments: cues for normalized movement,  could not remember the multistep command to get to EOB.  minimal truncal assist up to L elbow.    Transfers Overall transfer level: Needs assistance Equipment used: Rolling walker (2 wheels) Transfers: Sit to/from Stand, Bed to chair/wheelchair/BSC     Step pivot transfers: Mod assist, +2 safety/equipment        General transfer comment: cues for technique and hand placement.  assist forward, stability and boosting  x4 into RW for pre gait.    Ambulation/Gait Ambulation/Gait assistance: Mod assist, +2 physical assistance, +2 safety/equipment Gait Distance (Feet): 12 Feet Assistive device: Rolling walker (2 wheels) Gait Pattern/deviations: Step-to pattern, Step-through pattern   Gait velocity interpretation: <1.31 ft/sec, indicative of household ambulator   General Gait Details: paretic gait pattern L LE.  Work on upright posture, control of L knee and heel/toe pattern with stability and w/shift assist.   Stairs             Wheelchair Mobility    Modified Rankin (Stroke Patients Only) Modified Rankin (Stroke Patients Only) Pre-Morbid Rankin Score: No symptoms Modified Rankin: Moderately severe disability     Balance Overall balance assessment: Needs assistance Sitting-balance support: No upper extremity supported, Feet supported Sitting balance-Leahy Scale: Fair     Standing balance support: Bilateral upper extremity supported, During functional activity, Reliant on assistive device for balance Standing balance-Leahy Scale: Poor Standing balance comment: Reliant on BUE and external support                            Cognition Arousal/Alertness: Awake/alert Behavior During Therapy: WFL for tasks assessed/performed Overall Cognitive Status:  (NT formally today.  follow commands well, mild L inattention)                                          Exercises      General Comments General comments (skin integrity, edema, etc.): son present  Pertinent Vitals/Pain      Home Living                          Prior Function            PT Goals (current goals can now be found in the care plan section) Acute Rehab PT Goals Patient Stated Goal: to get stronger before going home PT Goal Formulation: With patient Time For Goal  Achievement: 07/13/21 Potential to Achieve Goals: Good Progress towards PT goals: Progressing toward goals    Frequency    Min 4X/week      PT Plan Current plan remains appropriate    Co-evaluation              AM-PAC PT "6 Clicks" Mobility   Outcome Measure  Help needed turning from your back to your side while in a flat bed without using bedrails?: A Little Help needed moving from lying on your back to sitting on the side of a flat bed without using bedrails?: A Little Help needed moving to and from a bed to a chair (including a wheelchair)?: A Lot Help needed standing up from a chair using your arms (e.g., wheelchair or bedside chair)?: A Lot Help needed to walk in hospital room?: A Lot Help needed climbing 3-5 steps with a railing? : Total 6 Click Score: 13    End of Session Equipment Utilized During Treatment: Gait belt Activity Tolerance: Patient tolerated treatment well Patient left: in chair;with call bell/phone within reach;with chair alarm set;with family/visitor present Nurse Communication: Mobility status PT Visit Diagnosis: Muscle weakness (generalized) (M62.81);Unsteadiness on feet (R26.81);Hemiplegia and hemiparesis Hemiplegia - Right/Left: Left Hemiplegia - dominant/non-dominant: Non-dominant Hemiplegia - caused by: Cerebral infarction     Time: 1324-4010 PT Time Calculation (min) (ACUTE ONLY): 23 min  Charges:  $Gait Training: 8-22 mins $Therapeutic Activity: 8-22 mins                     07/01/2021  Ginger Carne., PT Acute Rehabilitation Services [phone]  (pager) [phone]  (office)   Tessie Fass Dhruv Christina 07/01/2021, 1:31 PM

## 2021-07-01 NOTE — PMR Pre-admission (Signed)
PMR Admission Coordinator Pre-Admission Assessment  Patient: Laura Mendez is an 81 y.o., female MRN: [id number] DOB: 1941/04/10 Height: 5' 11" (180.3 cm) Weight: 88.5 kg  Insurance Information HMO:     PPO:      PCP:      IPA:      80/20:      OTHER:  PRIMARY: U[employer] medicare      Policy#: [id number]      Subscriber: pt CM Name: Aldean Jewett      Phone#: [phone] option #7     Fax#: [phone] Pre-Cert#: [id number]   approved for 7 days   Employer:  Benefits:  Phone #: [phone]     Name: 2/6 Eff. Date: 05/24/2021     Deduct: none      Out of Pocket Max: $3600      Life Max: none CIR: $295 co pay per day days 1 until 5      SNF: no copay days 1 until 20; $196 co pay per day days 21 until 39; no copay days 40 until 100 Outpatient: $20 per visit     Co-Pay: visits per medical neccesity Home Health: 100%      Co-Pay: visits per medical neccesity DME: 80%     Co-Pay: 20% Providers: in network  SECONDARY: none  Financial Counselor:       Phone#:   [facility] for patients in Inpatient Rehabilitation Facilities with attached Privacy Act [place] Records was provided and verbally reviewed with: Patient and Family  Emergency Contact Information Contact Information     Name Relation Home Work Mobile   [name] [phone]  [phone]   [name], [name]   [phone]       Current Medical History  Patient Admitting Diagnosis: CVA  History of Present Illness: 81 year old right-handed female with history of TIA, atrial fibrillation maintained on Xarelto followed by Dr. Marlou Porch, COPD followed by Dr. Annamaria Boots, diastolic congestive heart failure, remote history of right breast cancer with lumpectomy radioactive seed localization 2019 followed by Dr. Lindi Adie, quit smoking 44 years ago.  . Presented 06/28/2021 after a fall while walking from the foot of her bed striking her left knee with noted left-sided weakness and slurred speech.  MRI  showed acute infarct within bilateral cerebral hemispheres, right basal ganglia, posterior limb of right internal capsule, left thalamus and within the bilateral cerebellar hemispheres.  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  MRA of head showed no large vessel occlusion or high-grade stenosis.  MRA of the neck without hemodynamically significant stenosis.  CT of the left knee showed small joint effusion as well as tricompartmental degenerative osteoarthritis changes mostly pronounced at the lateral compartment.  Admission chemistries unremarkable except glucose 113, urine drug screen negative, urinalysis negative nitrite, CK 236.  Echocardiogram with ejection fraction of 60 to 65% no wall motion abnormalities grade 1 diastolic dysfunction.  Neurology follow-up and she was transition from [place] to [place].  Tolerating a regular diet.     Complete NIHSS TOTAL: 3  Patient's medical record from [facility] has been reviewed by the rehabilitation admission coordinator and physician.  Past Medical History  Past Medical History:  Diagnosis Date   Asthma    Chronic airway obstruction, not elsewhere classified    Diastolic dysfunction without heart failure    Disorder of bone and cartilage, unspecified    Dysrhythmia    A-Fib   Family history of colon cancer    Family history of kidney  cancer    Family history of ovarian cancer    Paroxysmal atrial flutter (HCC)    Stroke ([place])    TIA   TIA (transient ischemic attack)    Unspecified asthma(493.90)    Has the patient had major surgery during 100 days prior to admission? No  Family History   family history includes COPD in her father; Cancer in an other family member; Cirrhosis in her sister; Colon cancer in her cousin, maternal uncle, and maternal uncle; Colon cancer (age of onset: 35) in her mother; Heart attack in her paternal uncle; Heart disease in her maternal grandfather; Kidney cancer (age of onset: 20) in her  sister; Kidney cancer (age of onset: 53) in her brother; Lung cancer in her sister; Melanoma (age of onset: 30) in her sister; Ovarian cancer in her maternal aunt and maternal grandmother; Ovarian cancer (age of onset: 22) in her sister; Parkinson's disease in her brother; Rectal cancer in her paternal grandfather.  Current Medications  Current Facility-Administered Medications:    acetaminophen (TYLENOL) tablet 650 mg, 650 mg, Oral, Q4H PRN, 650 mg at 06/30/21 1024 **OR** acetaminophen (TYLENOL) 160 MG/5ML solution 650 mg, 650 mg, Per Tube, Q4H PRN **OR** acetaminophen (TYLENOL) suppository 650 mg, 650 mg, Rectal, Q4H PRN, Doutova, Anastassia, MD   albuterol (PROVENTIL) (2.5 MG/3ML) 0.083% nebulizer solution 2.5 mg, 2.5 mg, Inhalation, Q4H PRN, Doutova, Anastassia, MD   apixaban (ELIQUIS) tablet 5 mg, 5 mg, Oral, BID, Bertis Ruddy, RPH, 5 mg at 07/01/21 0857   atorvastatin (LIPITOR) tablet 20 mg, 20 mg, Oral, Daily, Rosalin Hawking, MD, 20 mg at 07/01/21 3419   calcium carbonate (TUMS - dosed in mg elemental calcium) chewable tablet 200 mg of elemental calcium, 1 tablet, Oral, TID PRN, Vance Gather B, MD, 200 mg of elemental calcium at 06/29/21 1527   letrozole [facility]) tablet 2.5 mg, 2.5 mg, Oral, Daily, Doutova, Anastassia, MD, 2.5 mg at 06/30/21 2149   pantoprazole (PROTONIX) EC tablet 40 mg, 40 mg, Oral, Daily, Vance Gather B, MD, 40 mg at 07/01/21 0857   senna-docusate (Senokot-S) tablet 1 tablet, 1 tablet, Oral, QHS PRN, Toy Baker, MD  Patients Current Diet:  Diet Order             Diet Heart Room service appropriate? Yes; Fluid consistency: Thin  Diet effective now                  Precautions / Restrictions Precautions Precautions: Fall Restrictions Weight Bearing Restrictions: No   Has the patient had 2 or more falls or a fall with injury in the past year? No  Prior Activity Level Community (5-7x/wk): independent and living alone  Prior Functional Level Self  Care: Did the patient need help bathing, dressing, using the toilet or eating? Independent  Indoor Mobility: Did the patient need assistance with walking from room to room (with or without device)? Independent  Stairs: Did the patient need assistance with internal or external stairs (with or without device)? Independent  Functional Cognition: Did the patient need help planning regular tasks such as shopping or remembering to take medications? Independent  Patient Information Are you of Hispanic, Latino/a,or Spanish origin?: A. No, not of Hispanic, Latino/a, or Spanish origin What is your race?: A. White Do you need or want an interpreter to communicate with a doctor or health care staff?: 0. No  Patient's Response To:  Health Literacy and Transportation Is the patient able to respond to health literacy and transportation needs?: Yes Health Literacy -  How often do you need to have someone help you when you read instructions, pamphlets, or other written material from your doctor or pharmacy?: Never In the past 12 months, has lack of transportation kept you from medical appointments or from getting medications?: No In the past 12 months, has lack of transportation kept you from meetings, work, or from getting things needed for daily living?: No  Development worker, international aid / [place] Devices/Equipment: None Home Equipment: Grab bars - tub/shower, Shower seat, Radio producer - single point, Conservation officer, nature (2 wheels)  Prior Device Use: Indicate devices/aids used by the patient prior to current illness, exacerbation or injury?  cane  Current Functional Level Cognition  Arousal/Alertness: Awake/alert Overall Cognitive Status: Impaired/Different from baseline Orientation Level: Oriented X4 Following Commands: Follows one step commands consistently, Follows multi-step commands consistently, Follows multi-step commands with increased time Safety/Judgement: Decreased awareness of  safety General Comments: pt is pleasant and aware of deficits. oriented to self, place, situation. eager to participate in therapy. Attention: Selective Selective Attention: Appears intact Memory: Impaired Memory Impairment: Retrieval deficit, Prospective memory Awareness: Impaired Awareness Impairment: Intellectual impairment Problem Solving: Impaired Problem Solving Impairment: Verbal basic Safety/Judgment: Impaired    Extremity Assessment (includes Sensation/Coordination)  Upper Extremity Assessment: Defer to OT evaluation LUE Deficits / Details: decreased strength (3-/5MMT), coordination LUE Sensation: decreased proprioception LUE Coordination: decreased fine motor, decreased gross motor  Lower Extremity Assessment: LLE deficits/detail, Generalized weakness LLE Deficits / Details: At least 3/5 throughout. Pt unable to have formal MMT as pt with L knee contusion and increased pain.    ADLs  Overall ADL's : Needs assistance/impaired Grooming: Moderate assistance, Sitting Upper Body Dressing : Moderate assistance, Sitting Lower Body Dressing: Total assistance, +2 for physical assistance, +2 for safety/equipment, Sit to/from stand Toilet Transfer Details (indicate cue type and reason): unable, mod assist +2 sit to stand Functional mobility during ADLs: Moderate assistance, +2 for physical assistance, +2 for safety/equipment    Mobility  Overal bed mobility: Needs Assistance Bed Mobility: Supine to Sit Supine to sit: Min assist, +2 for safety/equipment, HOB elevated Sit to supine: Max assist, +2 for physical assistance General bed mobility comments: Min assist with cues for bringing LE's off EOB and to reach for bed rail. Assist to fully raise trunk upright. pt steady once sitting EOB.    Transfers  Overall transfer level: Needs assistance Equipment used: Rolling walker (2 wheels) Transfers: Sit to/from Stand, Bed to chair/wheelchair/BSC Sit to Stand: Mod assist, +2  safety/equipment Bed to/from chair/wheelchair/BSC transfer type:: Step pivot Step pivot transfers: Mod assist, +2 safety/equipment General transfer comment: Mod Assist to power up from EOB with cues needed for using bil UE's on bed to initiate. Pt with reduced strength in Lt UE and asisst to bring hand on RW, pt choosing to reduce weight on Lt LE in standing due to weakness and knee pain.    Ambulation / Gait / Stairs / Wheelchair Mobility  Ambulation/Gait Ambulation/Gait assistance: Mod assist, +2 safety/equipment Gait Distance (Feet): 10 Feet Assistive device: Rolling walker (2 wheels) Gait Pattern/deviations: Step-to pattern, Decreased step length - right, Decreased step length - left, Decreased stance time - left, Decreased stride length, Decreased dorsiflexion - left, Decreased weight shift to left, Knee flexed in stance - left, Wide base of support General Gait Details: Mod assist for gait awith +2 for chair follow for safety. Manual facilitation of Lt elbow extension with Rt step to improve use of RW and blocking at Lt knee to prevent flexion in  stance phase. as pt fatigued bil UE's becoming weaker and trunk more flexed. seated rest provided after short distance in room. Gait velocity: decr    Posture / Balance Balance Overall balance assessment: Needs assistance Sitting-balance support: No upper extremity supported, Feet supported Sitting balance-Leahy Scale: Fair Standing balance support: Bilateral upper extremity supported, During functional activity, Reliant on assistive device for balance Standing balance-Leahy Scale: Poor Standing balance comment: Reliant on BUE and external support    Special needs/care consideration    Previous Home Environment  Living Arrangements: Alone  Lives With: Alone Available Help at Discharge:  (to stay with local son and his wife) Type of Home: Other(Comment) Home Layout:  (townhome) Alternate Level Stairs-Rails: Left, Right Alternate Level  Stairs-Number of Steps: flight Home Access: Stairs to enter Entrance Stairs-Rails: Right Entrance Stairs-Number of Steps: 2 Bathroom Shower/Tub: Gaffer, Chiropodist: Standard Bathroom Accessibility: Yes How Accessible: Accessible via walker [place]: No  Discharge Living Setting Plans for Discharge Living Setting: Lives with (comment) (to Live with son, AL and his wife short term only) Type of Home at Discharge: House Discharge Home Layout: One level, Laundry or work area in basement (She will be on main level) Discharge Home Access: Stairs to enter Entrance Stairs-Rails: Right Entrance Stairs-Number of Steps: 3 Discharge Bathroom Shower/Tub: Walk-in shower Discharge Bathroom Toilet: Standard Discharge Bathroom Accessibility: Yes How Accessible: Accessible via walker Does the patient have any problems obtaining your medications?: No  Social/Family/Support Systems Patient Roles: Parent Contact Information: Al, son local Anticipated Caregiver: Al and his wife. Wife works from home Anticipated [place]: [phone] Ability/Limitations of Caregiver: none Caregiver Availability: 24/7 Discharge Plan Discussed with Primary Caregiver: Yes Is Caregiver In Agreement with Plan?: Yes Does Caregiver/Family have Issues with Lodging/Transportation while Pt is in Rehab?: No  Goals Patient/Family Goal for Rehab: supervision to min assist PT and OT, supervision SLP Expected length of stay: ELOS 10 to 14 days Additional Information: She may have stair lift placecd in her town home for eventual return home alone Pt/Family Agrees to Admission and willing to participate: Yes Program Orientation Provided & Reviewed with Pt/Caregiver Including Roles  & Responsibilities: Yes  Decrease burden of Care through IP rehab admission: n/a  Possible need for SNF placement upon discharge: not anticipated  Patient Condition: I have reviewed  medical records from [facility] , spoken with CM, and patient and son. I met with patient at the bedside for inpatient rehabilitation assessment.  Patient will benefit from ongoing PT, OT, and SLP, can actively participate in 3 hours of therapy a day 5 days of the week, and can make measurable gains during the admission.  Patient will also benefit from the coordinated team approach during an Inpatient Acute Rehabilitation admission.  The patient will receive intensive therapy as well as Rehabilitation physician, nursing, social worker, and care management interventions.  Due to bladder management, bowel management, safety, skin/wound care, disease management, medication administration, pain management, and patient education the patient requires 24 hour a day rehabilitation nursing.  The patient is currently mod assist overall with mobility and basic ADLs.  Discharge setting and therapy post discharge at home with home health is anticipated.  Patient has agreed to participate in the Acute Inpatient Rehabilitation Program and will admit today.  Preadmission Screen Completed By:  Cleatrice Burke, 07/01/2021 10:26 AM ______________________________________________________________________   Discussed status with Dr. Ranell Patrick on 07/01/2021 at 1027 and received approval for admission today.  Admission Coordinator:  Julious Payer,  Audelia Acton, RN, time  1027 Date  07/01/2021   Assessment/Plan: Diagnosis: CVA Does the need for close, 24 hr/day Medical supervision in concert with the patient's rehab needs make it unreasonable for this patient to be served in a less intensive setting? Yes Co-Morbidities requiring supervision/potential complications: overweight, asthma with COPD, paroxysmal atrial flutter, rash and nonspecific skin eruption, malignant breast cancer Due to bladder management, bowel management, safety, skin/wound care, disease management, medication administration, pain management, and patient  education, does the patient require 24 hr/day rehab nursing? Yes Does the patient require coordinated care of a physician, rehab nurse, PT, OT, and SLP to address physical and functional deficits in the context of the above medical diagnosis(es)? Yes Addressing deficits in the following areas: balance, endurance, locomotion, strength, transferring, bowel/bladder control, bathing, dressing, feeding, grooming, toileting, cognition, and psychosocial support Can the patient actively participate in an intensive therapy program of at least 3 hrs of therapy 5 days a week? Yes The potential for patient to make measurable gains while on inpatient rehab is excellent Anticipated functional outcomes upon discharge from inpatient rehab: supervision PT, supervision OT, supervision SLP Estimated rehab length of stay to reach the above functional goals is: 10-14 days Anticipated discharge destination: Home 10. Overall Rehab/Functional Prognosis: excellent   MD Signature: Leeroy Cha, MD

## 2021-07-01 NOTE — Progress Notes (Signed)
Inpatient Rehabilitation Admissions Coordinator   CIR approved and bed is available to admit today. I met with patient and her son, Al at bedside and they are in agreement. Acute team and TOC made aware. I will make the arrangements to admit today.  Danne Baxter, RN, MSN Rehab Admissions Coordinator [phone] 07/01/2021 10:23 AM

## 2021-07-01 NOTE — Discharge Instructions (Addendum)
Inpatient Rehab Discharge Instructions  Laura Mendez Discharge date and time: No discharge date for patient encounter.   Activities/Precautions/ Functional Status: Activity: activity as tolerated Diet: regular diet Wound Care: Routine skin checks Functional status:  ___ No restrictions     ___ Walk up steps independently ___ 24/7 supervision/assistance   ___ Walk up steps with assistance ___ Intermittent supervision/assistance  ___ Bathe/dress independently ___ Walk with walker     __x_ Bathe/dress with assistance ___ Walk Independently    ___ Shower independently ___ Walk with assistance    ___ Shower with assistance ___ No alcohol     ___ Return to work/school ________  Special Instructions:  No driving smoking or alcoholSTROKE/TIA DISCHARGE INSTRUCTIONS SMOKING Cigarette smoking nearly doubles your risk of having a stroke & is the single most alterable risk factor  If you smoke or have smoked in the last 12 months, you are advised to quit smoking for your health. Most of the excess cardiovascular risk related to smoking disappears within a year of stopping. Ask you doctor about anti-smoking medications [place] Quit Line: 1-800-QUIT NOW Free Smoking Cessation Classes [phone]  CHOLESTEROL Know your levels; limit fat & cholesterol in your diet  Lipid Panel     Component Value Date/Time   CHOL 158 06/29/2021 0230   TRIG 102 06/29/2021 0230   TRIG 183 (H) 03/04/2006 1034   HDL 29 (L) 06/29/2021 0230   CHOLHDL 5.4 06/29/2021 0230   VLDL 20 06/29/2021 0230   LDLCALC 109 (H) 06/29/2021 0230     Many patients benefit from treatment even if their cholesterol is at goal. Goal: Total Cholesterol (CHOL) less than 160 Goal:  Triglycerides (TRIG) less than 150 Goal:  HDL greater than 40 Goal:  LDL (LDLCALC) less than 100   BLOOD PRESSURE American Stroke Association blood pressure target is less that 120/80 mm/Hg  Your discharge blood pressure is:    Monitor your blood  pressure Limit your salt and alcohol intake Many individuals will require more than one medication for high blood pressure  DIABETES (A1c is a blood sugar average for last 3 months) Goal HGBA1c is under 7% (HBGA1c is blood sugar average for last 3 months)  Diabetes: No known diagnosis of diabetes    Lab Results  Component Value Date   HGBA1C 5.1 06/29/2021    Your HGBA1c can be lowered with medications, healthy diet, and exercise. Check your blood sugar as directed by your physician Call your physician if you experience unexplained or low blood sugars.  PHYSICAL ACTIVITY/REHABILITATION Goal is 30 minutes at least 4 days per week  Activity: Increase activity slowly, Therapies: Physical Therapy: Home Health Return to work:  Activity decreases your risk of heart attack and stroke and makes your heart stronger.  It helps control your weight and blood pressure; helps you relax and can improve your mood. Participate in a regular exercise program. Talk with your doctor about the best form of exercise for you (dancing, walking, swimming, cycling).  DIET/WEIGHT Goal is to maintain a healthy weight  Your discharge diet is:  Diet Order             Diet Heart Room service appropriate? Yes; Fluid consistency: Thin  Diet effective now                   liquids Your height is:    Your current weight is:   Your Body Mass Index (BMI) is:    Following the type of diet  specifically designed for you will help prevent another stroke. Your goal weight range is:   Your goal Body Mass Index (BMI) is 19-24. Healthy food habits can help reduce 3 risk factors for stroke:  High cholesterol, hypertension, and excess weight.  RESOURCES Stroke/Support Group:  Call [phone]   STROKE EDUCATION PROVIDED/REVIEWED AND GIVEN TO PATIENT Stroke warning signs and symptoms How to activate emergency medical system (call 911). Medications prescribed at discharge. Need for follow-up after discharge. Personal  risk factors for stroke. Pneumonia vaccine given: No Flu vaccine given: No My questions have been answered, the writing is legible, and I understand these instructions.  I will adhere to these goals & educational materials that have been provided to me after my discharge from the hospital.      My questions have been answered and I understand these instructions. I will adhere to these goals and the provided educational materials after my discharge from the hospital.  Patient/Caregiver Signature _______________________________ Date __________  Clinician Signature _______________________________________ Date __________  Please bring this form and your medication list with you to all your follow-up doctor's appointments.  Information on my medicine - ELIQUIS (apixaban)  This medication education was reviewed with me or my healthcare representative as part of my discharge preparation.  Why was Eliquis prescribed for you? Eliquis was prescribed for you to reduce the risk of a blood clot forming that can cause a stroke if you have a medical condition called atrial fibrillation (a type of irregular heartbeat).  What do You need to know about Eliquis ? Take your Eliquis TWICE DAILY - one tablet in the morning and one tablet in the evening with or without food. If you have difficulty swallowing the tablet whole please discuss with your pharmacist how to take the medication safely.  Take Eliquis exactly as prescribed by your doctor and DO NOT stop taking Eliquis without talking to the doctor who prescribed the medication.  Stopping may increase your risk of developing a stroke.  Refill your prescription before you run out.  After discharge, you should have regular check-up appointments with your healthcare provider that is prescribing your Eliquis.  In the future your dose may need to be changed if your kidney function or weight changes by a significant amount or as you get older.  What do  you do if you miss a dose? If you miss a dose, take it as soon as you remember on the same day and resume taking twice daily.  Do not take more than one dose of ELIQUIS at the same time to make up a missed dose.  Important Safety Information A possible side effect of Eliquis is bleeding. You should call your healthcare provider right away if you experience any of the following: Bleeding from an injury or your nose that does not stop. Unusual colored urine (red or dark brown) or unusual colored stools (red or black). Unusual bruising for unknown reasons. A serious fall or if you hit your head (even if there is no bleeding).  Some medicines may interact with Eliquis and might increase your risk of bleeding or clotting while on Eliquis. To help avoid this, consult your healthcare provider or pharmacist prior to using any new prescription or non-prescription medications, including herbals, vitamins, non-steroidal anti-inflammatory drugs (NSAIDs) and supplements.  This website has more information on Eliquis (apixaban): http://[email or web address].com/eliquis/home

## 2021-07-02 DIAGNOSIS — I6319 Cerebral infarction due to embolism of other precerebral artery: Secondary | ICD-10-CM

## 2021-07-02 LAB — COMPREHENSIVE METABOLIC PANEL
ALT: 16 U/L (ref 0–44)
AST: 33 U/L (ref 15–41)
Albumin: 2.9 g/dL — ABNORMAL LOW (ref 3.5–5.0)
Alkaline Phosphatase: 45 U/L (ref 38–126)
Anion gap: 8 (ref 5–15)
BUN: 8 mg/dL (ref 8–23)
CO2: 24 mmol/L (ref 22–32)
Calcium: 8.7 mg/dL — ABNORMAL LOW (ref 8.9–10.3)
Chloride: 108 mmol/L (ref 98–111)
Creatinine, Ser: 0.81 mg/dL (ref 0.44–1.00)
GFR, Estimated: >60 mL/min (ref >60)
Glucose, Bld: 112 mg/dL — ABNORMAL HIGH (ref 70–99)
Potassium: 3.6 mmol/L (ref 3.5–5.1)
Sodium: 140 mmol/L (ref 135–145)
Total Bilirubin: 0.4 mg/dL (ref 0.3–1.2)
Total Protein: 6 g/dL — ABNORMAL LOW (ref 6.5–8.1)

## 2021-07-02 LAB — CBC WITH DIFFERENTIAL/PLATELET
Abs Immature Granulocytes: 0.02 10*3/uL (ref 0.00–0.07)
Basophils Absolute: 0 10*3/uL (ref 0.0–0.1)
Basophils Relative: 1 %
Eosinophils Absolute: 0.5 10*3/uL (ref 0.0–0.5)
Eosinophils Relative: 7 %
HCT: 35.7 % — ABNORMAL LOW (ref 36.0–46.0)
Hemoglobin: 11.5 g/dL — ABNORMAL LOW (ref 12.0–15.0)
Immature Granulocytes: 0 %
Lymphocytes Relative: 17 %
Lymphs Abs: 1.2 10*3/uL (ref 0.7–4.0)
MCH: 29.6 pg (ref 26.0–34.0)
MCHC: 32.2 g/dL (ref 30.0–36.0)
MCV: 91.8 fL (ref 80.0–100.0)
Monocytes Absolute: 0.7 10*3/uL (ref 0.1–1.0)
Monocytes Relative: 9 %
Neutro Abs: 4.7 10*3/uL (ref 1.7–7.7)
Neutrophils Relative %: 66 %
Platelets: 270 10*3/uL (ref 150–400)
RBC: 3.89 MIL/uL (ref 3.87–5.11)
RDW: 12.7 % (ref 11.5–15.5)
WBC: 7.1 10*3/uL (ref 4.0–10.5)
nRBC: 0 % (ref 0.0–0.2)

## 2021-07-02 NOTE — Plan of Care (Signed)
°  Problem: RH Balance Goal: LTG Patient will maintain dynamic standing with ADLs (OT) Description: LTG:  Patient will maintain dynamic standing balance with assist during activities of daily living (OT)  Flowsheets (Taken 07/02/2021 1243) LTG: Pt will maintain dynamic standing balance during ADLs with: Supervision/Verbal cueing   Problem: Sit to Stand Goal: LTG:  Patient will perform sit to stand in prep for activites of daily living with assistance level (OT) Description: LTG:  Patient will perform sit to stand in prep for activites of daily living with assistance level (OT) Flowsheets (Taken 07/02/2021 1243) LTG: PT will perform sit to stand in prep for activites of daily living with assistance level: Supervision/Verbal cueing   Problem: RH Grooming Goal: LTG Patient will perform grooming w/assist,cues/equip (OT) Description: LTG: Patient will perform grooming with assist, with/without cues using equipment (OT) Flowsheets (Taken 07/02/2021 1243) LTG: Pt will perform grooming with assistance level of: Independent with assistive device    Problem: RH Bathing Goal: LTG Patient will bathe all body parts with assist levels (OT) Description: LTG: Patient will bathe all body parts with assist levels (OT) Flowsheets (Taken 07/02/2021 1243) LTG: Pt will perform bathing with assistance level/cueing: Supervision/Verbal cueing   Problem: RH Dressing Goal: LTG Patient will perform upper body dressing (OT) Description: LTG Patient will perform upper body dressing with assist, with/without cues (OT). Flowsheets (Taken 07/02/2021 1243) LTG: Pt will perform upper body dressing with assistance level of: Supervision/Verbal cueing Goal: LTG Patient will perform lower body dressing w/assist (OT) Description: LTG: Patient will perform lower body dressing with assist, with/without cues in positioning using equipment (OT) Flowsheets (Taken 07/02/2021 1243) LTG: Pt will perform lower body dressing with assistance level  of: Supervision/Verbal cueing   Problem: RH Toileting Goal: LTG Patient will perform toileting task (3/3 steps) with assistance level (OT) Description: LTG: Patient will perform toileting task (3/3 steps) with assistance level (OT)  Flowsheets (Taken 07/02/2021 1243) LTG: Pt will perform toileting task (3/3 steps) with assistance level: Supervision/Verbal cueing   Problem: RH Functional Use of Upper Extremity Goal: LTG Patient will use RT/LT upper extremity as a (OT) Description: LTG: Patient will use right/left upper extremity as a stabilizer/gross assist/diminished/nondominant/dominant level with assist, with/without cues during functional activity (OT) Flowsheets (Taken 07/02/2021 1243) LTG: Use of upper extremity in functional activities: LUE as gross assist level LTG: Pt will use upper extremity in functional activity with assistance level of: Supervision/Verbal cueing   Problem: RH Toilet Transfers Goal: LTG Patient will perform toilet transfers w/assist (OT) Description: LTG: Patient will perform toilet transfers with assist, with/without cues using equipment (OT) Flowsheets (Taken 07/02/2021 1243) LTG: Pt will perform toilet transfers with assistance level of: Supervision/Verbal cueing   Problem: RH Tub/Shower Transfers Goal: LTG Patient will perform tub/shower transfers w/assist (OT) Description: LTG: Patient will perform tub/shower transfers with assist, with/without cues using equipment (OT) Flowsheets (Taken 07/02/2021 1243) LTG: Pt will perform tub/shower stall transfers with assistance level of: Supervision/Verbal cueing   Problem: RH Memory Goal: LTG Patient will demonstrate ability for day to day recall/carry over during activities of daily living with assistance level (OT) Description: LTG:  Patient will demonstrate ability for day to day recall/carry over during activities of daily living with assistance level (OT). Flowsheets (Taken 07/02/2021 1243) LTG:  Patient will  demonstrate ability for day to day recall/carry over during activities of daily living with assistance level (OT): Modified Independent

## 2021-07-02 NOTE — Plan of Care (Signed)
°  Problem: RH Balance Goal: LTG Patient will maintain dynamic sitting balance (PT) Description: LTG:  Patient will maintain dynamic sitting balance with assistance during mobility activities (PT) Flowsheets (Taken 07/02/2021 1740) LTG: Pt will maintain dynamic sitting balance during mobility activities with:: Independent with assistive device  Goal: LTG Patient will maintain dynamic standing balance (PT) Description: LTG:  Patient will maintain dynamic standing balance with assistance during mobility activities (PT) Flowsheets (Taken 07/02/2021 1740) LTG: Pt will maintain dynamic standing balance during mobility activities with:: Supervision/Verbal cueing   Problem: RH Bed Mobility Goal: LTG Patient will perform bed mobility with assist (PT) Description: LTG: Patient will perform bed mobility with assistance, with/without cues (PT). Flowsheets (Taken 07/02/2021 1740) LTG: Pt will perform bed mobility with assistance level of: Independent with assistive device    Problem: RH Bed to Chair Transfers Goal: LTG Patient will perform bed/chair transfers w/assist (PT) Description: LTG: Patient will perform bed to chair transfers with assistance (PT). Flowsheets (Taken 07/02/2021 1740) LTG: Pt will perform Bed to Chair Transfers with assistance level: Independent with assistive device    Problem: RH Car Transfers Goal: LTG Patient will perform car transfers with assist (PT) Description: LTG: Patient will perform car transfers with assistance (PT). Flowsheets (Taken 07/02/2021 1740) LTG: Pt will perform car transfers with assist:: Contact Guard/Touching assist   Problem: RH Furniture Transfers Goal: LTG Patient will perform furniture transfers w/assist (OT/PT) Description: LTG: Patient will perform furniture transfers  with assistance (OT/PT). Flowsheets (Taken 07/02/2021 1740) LTG: Pt will perform furniture transfers with assist:: Independent with assistive device    Problem: RH Ambulation Goal: LTG  Patient will ambulate in controlled environment (PT) Description: LTG: Patient will ambulate in a controlled environment, # of feet with assistance (PT). Flowsheets (Taken 07/02/2021 1740) LTG: Pt will ambulate in controlled environ  assist needed:: Supervision/Verbal cueing LTG: Ambulation distance in controlled environment: 128ft with LRAD Goal: LTG Patient will ambulate in home environment (PT) Description: LTG: Patient will ambulate in home environment, # of feet with assistance (PT). Flowsheets (Taken 07/02/2021 1740) LTG: Pt will ambulate in home environ  assist needed:: Supervision/Verbal cueing LTG: Ambulation distance in home environment: 59ft with LRAD   Problem: RH Wheelchair Mobility Goal: LTG Patient will propel w/c in controlled environment (PT) Description: LTG: Patient will propel wheelchair in controlled environment, # of feet with assist (PT) Flowsheets (Taken 07/02/2021 1740) LTG: Pt will propel w/c in controlled environ  assist needed:: Supervision/Verbal cueing LTG: Propel w/c distance in controlled environment: 1107ft   Problem: RH Stairs Goal: LTG Patient will ambulate up and down stairs w/assist (PT) Description: LTG: Patient will ambulate up and down # of stairs with assistance (PT) Flowsheets (Taken 07/02/2021 1740) LTG: Pt will ambulate up/down stairs assist needed:: Contact Guard/Touching assist LTG: Pt will  ambulate up and down number of stairs: 3 steps with 1 rail to access home

## 2021-07-02 NOTE — Evaluation (Signed)
Speech Language Pathology Assessment and Plan  Patient Details  Name: Laura Mendez MRN: [id number] Date of Birth: [birth year]  SLP Diagnosis: Cognitive Impairments  Rehab Potential: Excellent ELOS: 10-14 days    Today's Date: 07/02/2021 SLP Individual Time: 0800-0900 SLP Individual Time Calculation (min): 19 min   Hospital Problem: Principal Problem:   Embolic cerebral infarction [facility])  Past Medical History:  Past Medical History:  Diagnosis Date   Asthma    Chronic airway obstruction, not elsewhere classified    Diastolic dysfunction without heart failure    Disorder of bone and cartilage, unspecified    Dysrhythmia    A-Fib   Family history of colon cancer    Family history of kidney cancer    Family history of ovarian cancer    Paroxysmal atrial flutter ([place])    Stroke ([place])    TIA   TIA (transient ischemic attack)    Unspecified asthma(493.90)    Past Surgical History:  Past Surgical History:  Procedure Laterality Date   ABDOMINAL HYSTERECTOMY     BREAST LUMPECTOMY Right 12/30/2017   BREAST LUMPECTOMY WITH RADIOACTIVE SEED LOCALIZATION Right 12/30/2017   Procedure: BREAST LUMPECTOMY WITH RADIOACTIVE SEED LOCALIZATION X'S 3;  Surgeon: Rolm Bookbinder, MD;  Location: [place];  Service: General;  Laterality: Right;   BUNIONECTOMY Bilateral    EYE SURGERY Bilateral    cataract removal    Assessment / Plan / Recommendation Clinical Impression   CC: Acute infarct within bilateral cerebral hemispheres, right basal ganglia, posterior limb of right internal capsule, left thalamus and within the bilateral cerebellar hemispheres  Laura Mendez is an 81 year old right-handed female with history of TIA, atrial fibrillation maintained on Xarelto followed by Dr. Marlou Porch, COPD followed by Dr. Annamaria Boots, diastolic congestive heart failure, remote history of right breast cancer with lumpectomy radioactive seed localization 2019 followed by Dr. Lindi Adie, quit smoking 44 years  ago.  Per chart review patient lives alone.  Two-level home.  Independent prior to admission. Good family support. Presented 06/28/2021 after a fall while walking from the foot of her bed striking her left knee with noted left-sided weakness and slurred speech.  MRI showed acute infarct within bilateral cerebral hemispheres, right basal ganglia, posterior limb of right internal capsule, left thalamus and within the bilateral cerebellar hemispheres.  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  MRA of head showed no large vessel occlusion or high-grade stenosis.  MRA of the neck without hemodynamically significant stenosis.  CT of the left knee showed small joint effusion as well as tricompartmental degenerative osteoarthritis changes mostly pronounced at the lateral compartment.  Admission chemistries unremarkable except glucose 113, urine drug screen negative, urinalysis negative nitrite, CK 236.  Echocardiogram with ejection fraction of 60 to 65% no wall motion abnormalities grade 1 diastolic dysfunction.  Neurology follow-up and she was transition from [place] to [place].  Tolerating a regular diet.  Therapy evaluations completed due to patient's left-sided weakness was admitted for a comprehensive rehab program.  Pt was seen in-room for cognitive-linguistic evaluation this AM. Pt was pleasant and demonstrated willingness to participate in evaluation and subsequent therapy if warranted. Prior to this CVA, pt was living independently, managing all iADLs with no reported difficulty. Pt reports that she is active and has great family support. 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 SLP evaluated pt using [facility] Mental Status (SLUMS) and other informal measures. Pt scored a 22/30 on the SLUMS ( >27 WNL) indicating mild cognitive  impairment. Majority of pt's errors on SLUMS were in sections requiring working and short term memory. SLP observed pt to have difficulty  storing and manipulating information, requiring repetition throughout assessment. Pt was able to complete simple and multifactorial problem solving independently. Initially, pt reported she had no difficulties, but as we continued through the evaluation, pt reported that she "could have done that before". Mild dysarthria noted; however, it was not pronounced. Pt reported she has to "move her mouth more" when she is talking. SLP provided edu to pt regarding dysarthria and how speech intelligibility can fluctuate with time of day/fatigue. Due to PLOF, SLP rec skilled speech services to address memory and awareness to increase safety at home, as well as, dysarthria strategies. SLP rec further assessment of financial and medication management.    Skilled Therapeutic Interventions          SLP completed cognitive-linguistic evaluation. See above for details.   SLP Assessment  Patient will need skilled Speech Lanaguage Pathology Services during CIR admission    Recommendations  Follow up Recommendations: Outpatient SLP Equipment Recommended: None recommended by SLP    SLP Frequency 3 to 5 out of 7 days   SLP Duration  SLP Intensity  SLP Treatment/Interventions 10-14 days  Minumum of 1-2 x/day, 30 to 90 minutes  Cognitive remediation/compensation;Internal/external aids;Cueing hierarchy;Environmental controls;Therapeutic Activities;Functional tasks;Patient/family education;Therapeutic Exercise    Pain Pain Assessment Pain Scale: 0-10 Pain Score: 0-No pain  Prior Functioning Type of Home: Other(Comment)  Lives With: Alone Available Help at Discharge: Family;Available 24 hours/day (to stay with local son and his wife) Vocation: Retired  Programmer, systems Overall Cognitive Status: Impaired/Different from baseline Arousal/Alertness: Awake/alert Orientation Level: Oriented X4 Year: 2023 Month: February Day of Week: Correct Attention: Selective Selective Attention: Appears  intact Alternating Attention: Appears intact Memory: Impaired Memory Impairment: Storage deficit;Retrieval deficit Immediate Memory Recall: Sock;Blue;Bed Memory Recall Sock: Not able to recall Memory Recall Blue: Without Cue Memory Recall Bed: Without Cue Awareness: Impaired Awareness Impairment: Emergent impairment Problem Solving: Appears intact Problem Solving Impairment: Verbal complex;Verbal basic Executive Function: Writer: Impaired Organizing Impairment: Verbal complex Safety/Judgment: Appears intact [employer] Scales of Cognitive Functioning: Automatic/appropriate  Comprehension Auditory Comprehension Overall Auditory Comprehension: Appears within functional limits for tasks assessed Expression Expression Primary Mode of Expression: Verbal Verbal Expression Overall Verbal Expression: Appears within functional limits for tasks assessed Written Expression Dominant Hand: Right Oral Motor Oral Motor/Sensory Function Overall Oral Motor/Sensory Function: Within functional limits Motor Speech Overall Motor Speech: Impaired Respiration: Within functional limits Phonation: Normal Resonance: Within functional limits Articulation: Impaired Level of Impairment: Conversation Intelligibility: Intelligible Motor Planning: Witnin functional limits Effective Techniques: Over-articulate  Care Tool Care Tool Cognition Ability to hear (with hearing aid or hearing appliances if normally used Ability to hear (with hearing aid or hearing appliances if normally used): 1. Minimal difficulty - difficulty in some environments (e.g. when person speaks softly or setting is noisy)   Expression of Ideas and Wants Expression of Ideas and Wants: 4. Without difficulty (complex and basic) - expresses complex messages without difficulty and with speech that is clear and easy to understand   Understanding Verbal and Non-Verbal Content Understanding Verbal and Non-Verbal  Content: 4. Understands (complex and basic) - clear comprehension without cues or repetitions  Memory/Recall Ability Memory/Recall Ability : Current season;That he or she is in a hospital/hospital unit     Short Term Goals: Week 1: SLP Short Term Goal 1 (Week 1): Patient will recall 2-3 speech intelligibility strategies  to increase intelligibility when fatigued with minA verbal cues. SLP Short Term Goal 2 (Week 1): Patient will recall and demonstrate use of memory compensations to increase independence and safety at home with minA verbal cues. SLP Short Term Goal 3 (Week 1): Patient will identify 2-3 areas of impairment to increase awareness of deficits with minA verbal cues.  Refer to Care Plan for Long Term Goals  Recommendations for other services: None   Discharge Criteria: Patient will be discharged from SLP if patient refuses treatment 3 consecutive times without medical reason, if treatment goals not met, if there is a change in medical status, if patient makes no progress towards goals or if patient is discharged from hospital.  The above assessment, treatment plan, treatment alternatives and goals were discussed and mutually agreed upon: by patient  Verdene Lennert MS, CCC-SLP, CBIS  07/02/2021, 3:30 PM

## 2021-07-02 NOTE — Progress Notes (Signed)
Patient ID: Laura Mendez, female   DOB: 18-Nov-1940, 81 y.o.   MRN: [id number] Met with the patient to introduce self, role, review rehab process, and team conference with plan of care. Discussed situation, secondary risk including HF, HLD (LDL 109) and Afib on Xarelto now changed to Eliquis. Patient noted she was active PTA; went to the gym once a week and felt like she was fairly healthy for her age and able to get around on her own. Reports weakness in left side and forced speech at present. Discussed medications and HH dietary modification recommendations. Patient expressed interest in stair lift for her home as all bedrooms are upstairs; given handouts for resources available in the community for lifts. Continue to follow along to discharge to address educational needs and facilitate preparation for discharge. Margarito Liner

## 2021-07-02 NOTE — Evaluation (Signed)
Physical Therapy Assessment and Plan  Patient Details  Name: Laura Mendez MRN: [id number] Date of Birth: 10/25/1940  PT Diagnosis: Abnormal posture, Abnormality of gait, Coordination disorder, Hemiplegia non-dominant, and Impaired cognition Rehab Potential: Excellent ELOS: 12-16 days   Today's Date: 07/02/2021 PT Individual Time:  -       Hospital Problem: Principal Problem:   Embolic cerebral infarction [facility])   Past Medical History:  Past Medical History:  Diagnosis Date   Asthma    Chronic airway obstruction, not elsewhere classified    Diastolic dysfunction without heart failure    Disorder of bone and cartilage, unspecified    Dysrhythmia    A-Fib   Family history of colon cancer    Family history of kidney cancer    Family history of ovarian cancer    Paroxysmal atrial flutter ([place])    Stroke ([place])    TIA   TIA (transient ischemic attack)    Unspecified asthma(493.90)    Past Surgical History:  Past Surgical History:  Procedure Laterality Date   ABDOMINAL HYSTERECTOMY     BREAST LUMPECTOMY Right 12/30/2017   BREAST LUMPECTOMY WITH RADIOACTIVE SEED LOCALIZATION Right 12/30/2017   Procedure: BREAST LUMPECTOMY WITH RADIOACTIVE SEED LOCALIZATION X'S 3;  Surgeon: Rolm Bookbinder, MD;  Location: [facility] OR;  Service: General;  Laterality: Right;   BUNIONECTOMY Bilateral    EYE SURGERY Bilateral    cataract removal    Assessment & Plan Clinical Impression: Patient is a 81 year old right-handed female with history of TIA, atrial fibrillation maintained on Xarelto followed by Dr. Marlou Porch, COPD followed by Dr. Annamaria Boots, diastolic congestive heart failure, remote history of right breast cancer with lumpectomy radioactive seed localization 2019 followed by Dr. Lindi Adie, quit smoking 44 years ago.  Per chart review patient lives alone.  Two-level home.  Independent prior to admission. Good family support. Presented 06/28/2021 after a fall while walking from the foot of her bed  striking her left knee with noted left-sided weakness and slurred speech.  MRI showed acute infarct within bilateral cerebral hemispheres, right basal ganglia, posterior limb of right internal capsule, left thalamus and within the bilateral cerebellar hemispheres.  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  MRA of head showed no large vessel occlusion or high-grade stenosis.  MRA of the neck without hemodynamically significant stenosis.  CT of the left knee showed small joint effusion as well as tricompartmental degenerative osteoarthritis changes mostly pronounced at the lateral compartment.  Admission chemistries unremarkable except glucose 113, urine drug screen negative, urinalysis negative nitrite, CK 236.  Echocardiogram with ejection fraction of 60 to 65% no wall motion abnormalities grade 1 diastolic dysfunction.  Neurology follow-up and she was transition from [place] to [place].  Tolerating a regular diet.  Therapy evaluations completed due to patient's left-sided weakness was admitted for a comprehensive rehab program. Patient transferred to CIR on 07/01/2021 .   Patient currently requires mod with mobility secondary to muscle weakness and muscle joint tightness, decreased cardiorespiratoy endurance, unbalanced muscle activation and decreased motor planning, decreased attention, decreased awareness, decreased problem solving, decreased safety awareness, and delayed processing, and decreased sitting balance, decreased standing balance, decreased postural control, hemiplegia, and decreased balance strategies.  Prior to hospitalization, patient was independent  with mobility and lived with Alone in a Other(Comment) home.  Home access is 3Stairs to enter.  Patient will benefit from skilled PT intervention to maximize safe functional mobility, minimize fall risk, and decrease caregiver burden for planned discharge home with intermittent assist.  Anticipate patient will benefit from follow up  [place] at discharge.  PT - End of Session Activity Tolerance: Tolerates 10 - 20 min activity with multiple rests Endurance Deficit: Yes PT Assessment Rehab Potential (ACUTE/IP ONLY): Excellent PT Barriers to Discharge: [place] home environment;Decreased caregiver support;Home environment access/layout;Wound Care;Insurance for SNF coverage PT Patient demonstrates impairments in the following area(s): Balance;Behavior;Edema;Endurance;Nutrition;Pain;Motor;Safety;Skin Integrity PT Transfers Functional Problem(s): Bed Mobility;Bed to Chair;Car;Furniture;Floor PT Locomotion Functional Problem(s): Ambulation;Wheelchair Mobility;Stairs PT Plan PT Intensity: Minimum of 1-2 x/day ,45 to 90 minutes PT Frequency: 5 out of 7 days PT Duration Estimated Length of Stay: 12-16 days PT Treatment/Interventions: Ambulation/gait training;Balance/vestibular training;Cognitive remediation/compensation;Disease management/prevention;Discharge planning;Community reintegration;Neuromuscular re-education;Functional electrical stimulation;Functional mobility training;Patient/family education;Pain management;DME/adaptive equipment instruction;Psychosocial support;Skin care/wound management;Splinting/orthotics;Therapeutic Exercise;Stair training;UE/LE Strength taining/ROM;Visual/perceptual remediation/compensation;UE/LE Coordination activities;Therapeutic Activities;Wheelchair propulsion/positioning PT Transfers Anticipated Outcome(s): Mod I with LRAD PT Locomotion Anticipated Outcome(s): Supervision asist with LRAD PT Recommendation Recommendations for Other Services: Therapeutic Recreation consult Therapeutic Recreation Interventions: Stress management;Outing/community reintergration Follow Up Recommendations: Home health PT Patient destination: Home Equipment Recommended: To be determined   PT Evaluation Precautions/Restrictions   General   Vital Signs Pain Pain Assessment Pain Scale: 0-10 Pain Score: 0-No  pain Pain Interference   Home Living/Prior Functioning Home Living Available Help at Discharge: Family;Available 24 hours/day (to stay with local son and his wife) Type of Home: Other(Comment) Home Access: Stairs to enter Entrance Stairs-Number of Steps: 3 Entrance Stairs-Rails: Right Home Layout: Able to live on main level with bedroom/bathroom;Multi-level (townhome) Alternate Level Stairs-Number of Steps: flight Alternate Level Stairs-Rails: Left;Right Bathroom Shower/Tub: Multimedia programmer: Standard Bathroom Accessibility: Yes Additional Comments: plans to d/c to son's home  Lives With: Alone Prior Function Level of Independence: Independent with basic ADLs;Independent with gait;Independent with homemaking with ambulation;Independent with transfers  Able to Take Stairs?: Yes Driving: Yes Vocation: Retired Vision/Perception  Vision - Risk analyst: Within Functional Limits Ocular Range of Motion: Within Functional Limits Alignment/Gaze Preference: Within Defined Limits Tracking/Visual Pursuits: Able to track stimulus in all quads without difficulty Saccades: Decreased speed of saccadic movement;Additional head turns occurred during testing Convergence: Impaired (comment) Perception Perception: Within Functional Limits Praxis Praxis: Impaired Praxis Impairment Details: Motor planning  Cognition Overall Cognitive Status: Impaired/Different from baseline Arousal/Alertness: Awake/alert Orientation Level: Oriented X4 Year: 2023 Month: February Day of Week: Correct Attention: Selective Selective Attention: Appears intact Alternating Attention: Appears intact Memory: Impaired Memory Impairment: Storage deficit;Retrieval deficit Awareness: Impaired Awareness Impairment: Emergent impairment Problem Solving: Appears intact Problem Solving Impairment: Verbal complex;Verbal basic Executive Function: Writer: Impaired Organizing  Impairment: Verbal complex Safety/Judgment: Appears intact [employer] Scales of Cognitive Functioning: Publishing rights manager Light Touch: Appears Intact Hot/Cold: Appears Intact Proprioception: Impaired Detail Proprioception Impaired Details: Impaired LUE Stereognosis: Impaired Detail Stereognosis Impaired Details: Impaired LUE Coordination Gross Motor Movements are Fluid and Coordinated: No Fine Motor Movements are Fluid and Coordinated: No Coordination and Movement Description: mild L hemi Finger Nose Finger Test: slowed, mild dysmetria Heel Shin Test: limted ROM due to weakness on the LLE with mild dysmetria Motor  Motor Motor: Hemiplegia;Abnormal postural alignment and control Motor - Skilled Clinical Observations: mild L hemi   Trunk/Postural Assessment  Cervical Assessment Cervical Assessment: Within Functional Limits Thoracic Assessment Thoracic Assessment: Exceptions to [facility] (mild rounded shoulders) Lumbar Assessment Lumbar Assessment: Exceptions to [facility] (mild posterior pelvic tilt in sitting) Postural Control Postural Control: Deficits on evaluation (mild L lean in standing with no BUE support)  Balance Balance Balance Assessed: Yes Static Sitting Balance Static Sitting - Balance  Support: Feet supported;No upper extremity supported Static Sitting - Level of Assistance: 5: Stand by assistance Dynamic Sitting Balance Dynamic Sitting - Balance Support: Feet unsupported Dynamic Sitting - Level of Assistance: 5: Stand by assistance;4: Min assist Static Standing Balance Static Standing - Balance Support: Bilateral upper extremity supported;During functional activity Static Standing - Level of Assistance: 4: Min assist;3: Mod assist Dynamic Standing Balance Dynamic Standing - Balance Support: During functional activity;Bilateral upper extremity supported Dynamic Standing - Level of Assistance: 4: Min assist Extremity Assessment      RLE  Assessment RLE Assessment: Within Functional Limits General Strength Comments: grossly 4+/5 LLE Assessment LLE Assessment: Exceptions to [facility] General Strength Comments: 4-/5 hip flexion and ankle PF. all others 4/5 with MMT  Care Tool Care Tool Bed Mobility Roll left and right activity   Roll left and right assist level: Contact Guard/Touching assist    Sit to lying activity   Sit to lying assist level: Moderate Assistance - Patient 50 - 74%    Lying to sitting on side of bed activity   Lying to sitting on side of bed assist level: the ability to move from lying on the back to sitting on the side of the bed with no back support.: Moderate Assistance - Patient 50 - 74%     Care Tool Transfers Sit to stand transfer   Sit to stand assist level: Moderate Assistance - Patient 50 - 74%    Chair/bed transfer   Chair/bed transfer assist level: Moderate Assistance - Patient 50 - 74%     Toilet transfer   Assist Level: Moderate Assistance - Patient 50 - 74%    Car transfer Car transfer activity did not occur: Refused        Care Tool Locomotion Ambulation   Assist level: Minimal Assistance - Patient > 75% Assistive device: Walker-rolling Max distance: 20  Walk 10 feet activity   Assist level: Minimal Assistance - Patient > 75% Assistive device: Walker-rolling   Walk 50 feet with 2 turns activity Walk 50 feet with 2 turns activity did not occur: Refused      Walk 150 feet activity Walk 150 feet activity did not occur: Safety/medical concerns      Walk 10 feet on uneven surfaces activity Walk 10 feet on uneven surfaces activity did not occur: Safety/medical concerns      Stairs Stair activity did not occur: Safety/medical concerns        Walk up/down 1 step activity Walk up/down 1 step or curb (drop down) activity did not occur: Safety/medical concerns      Walk up/down 4 steps activity Walk up/down 4 steps activity did not occur: Safety/medical concerns      Walk  up/down 12 steps activity Walk up/down 12 steps activity did not occur: Safety/medical concerns      Pick up small objects from floor   Pick up small object from the floor assist level: Maximal Assistance - Patient 25 - 49%    Wheelchair Is the patient using a wheelchair?: Yes Type of Wheelchair: Manual   Wheelchair assist level: Minimal Assistance - Patient > 75% Max wheelchair distance: 150  Wheel 50 feet with 2 turns activity   Assist Level: Minimal Assistance - Patient > 75%  Wheel 150 feet activity   Assist Level: Minimal Assistance - Patient > 75%    Refer to Care Plan for [place] 1    Recommendations for other services: Therapeutic Recreation  Stress  management and Outing/community reintegration  Skilled Therapeutic Intervention Mobility Bed Mobility Bed Mobility: Supine to Sit;Sit to Supine Supine to Sit: Moderate Assistance - Patient 50-74% Sit to Supine: Moderate Assistance - Patient 50-74% Transfers Transfers: Sit to Stand;Stand Pivot Transfers;Stand to Sit Sit to Stand: Moderate Assistance - Patient 50-74% Stand to Sit: Moderate Assistance - Patient 50-74% Stand Pivot Transfers: Moderate Assistance - Patient 50 - 74% Stand Pivot Transfer Details: Verbal cues for technique;Verbal cues for precautions/safety;Tactile cues for posture;Manual facilitation for weight shifting;Verbal cues for safe use of DME/AE Transfer (Assistive device): Rolling walker Locomotion  Gait Ambulation: Yes Gait Assistance: Minimal Assistance - Patient > 75% Gait Distance (Feet): 20 Feet Assistive device: Rolling walker Gait Gait: Yes Gait Pattern: Impaired Gait Pattern: Right flexed knee in stance;Poor foot clearance - right;Lateral hip instability Stairs / Additional Locomotion Stairs: Yes Stairs Assistance: Minimal Assistance - Patient > 75% Stair Management Technique: Two rails Number of Stairs: 4 Height of Stairs: 3 (and 6) Wheelchair  Mobility Wheelchair Mobility: Yes Wheelchair Assistance: Minimal assistance - Patient >75% Wheelchair Propulsion: Both upper extremities Wheelchair Parts Management: Needs assistance Distance: 180f   Pt received sitting in WC and agreeable to PT. PT instructed patient in PT Evaluation and initiated treatment intervention; see above for results. PT educated patient in [place] rehab potential, rehab goals, and discharge recommendations along with recommendation for follow-up rehabilitation services. Gait training in parallel bars with min assist for safety. No knee instability noted. Gait training then performed with RW as listed above. Stair management training with BUE support on 3" and 6" step with min assist overall and max cues for step to gait pattern. Pt decliened car transfer due to painin knee. WC mobility with min assist as listed. PT instructed pt in grasp stregnthening per pt request with yellow foam. [facility] to [facility] and [facility] and all needs met.        Discharge Criteria: Patient will be discharged from PT if patient refuses treatment 3 consecutive times without medical reason, if treatment goals not met, if there is a change in medical status, if patient makes no progress towards goals or if patient is discharged from hospital.  The above assessment, treatment plan, treatment alternatives and goals were discussed and mutually agreed upon: by patient  ALorie Phenix2/01/2022, 6:23 PM

## 2021-07-02 NOTE — Progress Notes (Signed)
[place] Individual Statement of Services  Patient Name:  Laura Mendez  Date:  07/02/2021  Welcome to the [facility].  Our goal is to provide you with an individualized program based on your diagnosis and situation, designed to meet your specific needs.  With this comprehensive rehabilitation program, you will be expected to participate in at least 3 hours of rehabilitation therapies Monday-Friday, with modified therapy programming on the weekends.  Your rehabilitation program will include the following services:  Physical Therapy (PT), Occupational Therapy (OT), Speech Therapy (ST), 24 hour per day rehabilitation nursing, Therapeutic Recreaction (TR), Neuropsychology, Care Coordinator, Rehabilitation Medicine, Nutrition Services, Pharmacy Services, and Other  Weekly team conferences will be held on Wednesdays to discuss your progress.  Your Inpatient Rehabilitation Care Coordinator will talk with you frequently to get your input and to update you on team discussions.  Team conferences with you and your family in attendance may also be held.  Expected length of stay:  10-14 Days  Overall anticipated outcome:  Supervision to Min A  Depending on your progress and recovery, your program may change. Your Inpatient Rehabilitation Care Coordinator will coordinate services and will keep you informed of any changes. Your Inpatient Rehabilitation Care Coordinator's name and contact numbers are listed  below.  The following services may also be recommended but are not provided by the [facility]:   [place] will be made to provide these services after discharge if needed.  Arrangements include referral to agencies that provide these services.  Your insurance has been verified to be:   [facility] Your primary doctor is:  Pricilla Holm, MD  Pertinent  information will be shared with your doctor and your insurance company.  Inpatient Rehabilitation Care Coordinator:  Erlene Quan, [place] or [phone]  Information discussed with and copy given to patient by: Dyanne Iha, 07/02/2021, 11:18 AM

## 2021-07-02 NOTE — Progress Notes (Signed)
PROGRESS NOTE   Subjective/Complaints:  No issues overnite, no confusion   ROS- neg CP, SOB , + Left knee pain   Objective:   No results found. Recent Labs    07/02/21 0504  WBC 7.1  HGB 11.5*  HCT 35.7*  PLT 270   Recent Labs    07/02/21 0504  NA 140  K 3.6  CL 108  CO2 24  GLUCOSE 112*  BUN 8  CREATININE 0.81  CALCIUM 8.7*    Intake/Output Summary (Last 24 hours) at 07/02/2021 0742 Last data filed at 07/01/2021 1902 Gross per 24 hour  Intake 240 ml  Output 200 ml  Net 40 ml        Physical Exam: Vital Signs Blood pressure 136/66, pulse 68, temperature 98.1 F (36.7 C), temperature source Oral, resp. rate 14, height 5\' 10"  (1.778 m), weight 91.1 kg, SpO2 94 %.  General: No acute distress Mood and affect are appropriate Heart: Regular rate and rhythm no rubs murmurs or extra sounds Lungs: Clear to auscultation, breathing unlabored, no rales or wheezes Abdomen: Positive bowel sounds, soft nontender to palpation, nondistended Extremities: No clubbing, cyanosis, or edema Skin: Left pre patellar abrasion , no active leaking cont Xeroform + foam dressing  Neurologic: Cranial nerves II through XII intact, motor strength is 5/5 in RIgh tan d4/5 Left deltoid, bicep, tricep, grip, hip flexor, knee extensors, ankle dorsiflexor and plantar flexor Sensory exam normal sensation to light touch and  in bilateral upper and lower extremities  Musculoskeletal: Reduced ROM due to pain LLE     Assessment/Plan: 1. Functional deficits which require 3+ hours per day of interdisciplinary therapy in a comprehensive inpatient rehab setting. Physiatrist is providing close team supervision and 24 hour management of active medical problems listed below. Physiatrist and rehab team continue to assess barriers to discharge/monitor patient progress toward functional and medical goals  Care Tool:  Bathing               Bathing assist       Upper Body Dressing/Undressing Upper body dressing        Upper body assist      Lower Body Dressing/Undressing Lower body dressing            Lower body assist       Toileting Toileting    Toileting assist Assist for toileting: Contact Guard/Touching assist     Transfers Chair/bed transfer  Transfers assist           Locomotion Ambulation   Ambulation assist              Walk 10 feet activity   Assist           Walk 50 feet activity   Assist           Walk 150 feet activity   Assist           Walk 10 feet on uneven surface  activity   Assist           Wheelchair     Assist               Wheelchair 50 feet with 2  turns activity    Assist            Wheelchair 150 feet activity     Assist          Blood pressure 136/66, pulse 68, temperature 98.1 F (36.7 C), temperature source Oral, resp. rate 14, height 5\' 10"  (1.778 m), weight 91.1 kg, SpO2 94 %.  Medical Problem List and Plan: 1. Functional deficits secondary to acute infarct bilateral cerebral hemispheres, right basal ganglia posterior limb right internal capsule left thalamus/embolic shower likely due to embolism source with A-fib failed Xarelto now on Eliquis               -patient may shower             -ELOS/Goals: 10-14 days modI             -Admit to CIR 2.  Antithrombotics: -DVT/anticoagulation:  Pharmaceutical: Other (comment) Eliquis             -antiplatelet therapy: N/A 3. Left knee pain: Recommended applying ice 15 minutes three times per day.Tylenol as needed 4. Mood: Provide emotional support             -antipsychotic agents: N/A 5. Neuropsych: This patient is capable of making decisions on her own behalf. 6. Skin/Wound Care: Routine skin checks 7. Fluids/Electrolytes/Nutrition: Routine in and outs with follow-up chemistries 8.  Hyperlipidemia.  Continue Lipitor 9.  not on antihypertensive  meds at home , will cont to monitor , normotensive today  Vitals:   07/01/21 1403 07/01/21 2015  BP: 137/68 136/66  Pulse: 63 68  Resp: 19 14  Temp: (!) 97.5 F (36.4 C) 98.1 F (36.7 C)  SpO2: 97% 94%    10.  History of breast cancer.  Continue Femara 11.  COPD/asthma.  Monitor oxygen saturations every shift.  Continue nebulizer as needed.  Follow-up Dr. Annamaria Boots 12.  Atrial fibrillation.  Cardiac rate controlled.  Continue Eliquis.  Follow-up Dr. Marlou Porch- rate controlled no BB 13.  Diastolic congestive heart failure.  Monitor for any signs of fluid overload 14. Overweight: BMI 28.82- provide dietary education    LOS: 1 days A FACE TO FACE EVALUATION WAS PERFORMED  Charlett Blake 07/02/2021, 7:42 AM

## 2021-07-02 NOTE — Progress Notes (Signed)
Inpatient Rehabilitation Care Coordinator Assessment and Plan Patient Details  Name: Laura Mendez MRN: [id number] Date of Birth: 10-31-1940  Today's Date: 07/02/2021  Hospital Problems: Principal Problem:   Embolic cerebral infarction [facility])  Past Medical History:  Past Medical History:  Diagnosis Date   Asthma    Chronic airway obstruction, not elsewhere classified    Diastolic dysfunction without heart failure    Disorder of bone and cartilage, unspecified    Dysrhythmia    A-Fib   Family history of colon cancer    Family history of kidney cancer    Family history of ovarian cancer    Paroxysmal atrial flutter ([place])    Stroke ([place])    TIA   TIA (transient ischemic attack)    Unspecified asthma(493.90)    Past Surgical History:  Past Surgical History:  Procedure Laterality Date   ABDOMINAL HYSTERECTOMY     BREAST LUMPECTOMY Right 12/30/2017   BREAST LUMPECTOMY WITH RADIOACTIVE SEED LOCALIZATION Right 12/30/2017   Procedure: BREAST LUMPECTOMY WITH RADIOACTIVE SEED LOCALIZATION X'S 3;  Surgeon: Rolm Bookbinder, MD;  Location: [place];  Service: General;  Laterality: Right;   BUNIONECTOMY Bilateral    EYE SURGERY Bilateral    cataract removal   Social History:  reports that she quit smoking about 44 years ago. Her smoking use included cigarettes. She has never used smokeless tobacco. She reports that she does not drink alcohol and does not use drugs.  Family / Support Systems Marital Status: Single Children: Al (Son), Aaron Edelman (Son) Anticipated Caregiver: Al and DIL Ability/Limitations of Caregiver: none Caregiver Availability: 24/7 Family Dynamics: support from patient sons and DIL  Social History Preferred language: English Religion: None Health Literacy - How often do you need to have someone help you when you read instructions, pamphlets, or other written material from your doctor or pharmacy?: Never Writes: Yes Employment Status: Unemployed Special educational needs teacher Issues: n/a Guardian/Conservator: n/a   Abuse/Neglect Abuse/Neglect Assessment Can Be Completed: Yes Physical Abuse: Denies Verbal Abuse: Denies Sexual Abuse: Denies Exploitation of patient/patient's resources: Denies Self-Neglect: Denies  Patient response to: Social Isolation - How often do you feel lonely or isolated from those around you?: Never  Emotional Status Pt's affect, behavior and adjustment status: n/a Recent Psychosocial Issues: coping Psychiatric History: n/a Substance Abuse History: quit smoking 44 years ago  Patient / Family Perceptions, Expectations & Goals Pt/Family understanding of illness & functional limitations: yes Premorbid pt/family roles/activities: indpendent and living alone Anticipated changes in roles/activities/participation: plans to stay with son Al and DIL Pt/family expectations/goals: supervision to min [place]: None Premorbid Home Care/DME Agencies: Other (Comment) [employer], [place]. [place]) Transportation available at discharge: son and DIL able to transport Is the patient able to respond to transportation needs?: Yes In the past 12 months, has lack of transportation kept you from medical appointments or from getting medications?: No In the past 12 months, has lack of transportation kept you from meetings, work, or from getting things needed for daily living?: No  Discharge Planning Living Arrangements: Alone Support Systems: Children Type of Residence: Private residence Insurance Resources: Multimedia programmer (specify) Sports administrator) Financial Resources: Family Support Financial Screen Referred: No Living Expenses: Lives with family Money Management: Patient Does the patient have any problems obtaining your medications?: No Home Management: independent Patient/Family Preliminary Plans: son and DIL able to assist if needed Care Coordinator Barriers to Discharge:  Insurance for SNF coverage Care Coordinator Anticipated Follow Up Needs: HH/OP Expected  length of stay: 10-14 Days  Clinical Impression SW met with patient, introduced self, explained role and addressed questions and concerns. Patient plans to d/c home with son or have stair lift in place at her home. Sw will cont to follow up.  Dyanne Iha 07/02/2021, 12:50 PM

## 2021-07-02 NOTE — Evaluation (Addendum)
Occupational Therapy Assessment and Plan  Patient Details  Name: Laura Mendez MRN: [id number] Date of Birth: 26-Aug-1940  OT Diagnosis: abnormal posture, cognitive deficits, hemiplegia affecting non-dominant side, muscle weakness (generalized), and decreased motor planning, activity tolerance, static/dynamic standing balance Rehab Potential: Rehab Potential (ACUTE ONLY): Good ELOS: 14 to 16 days.   Today's Date: 07/02/2021 OT Individual Time: 1002-1102 OT Individual Time Calculation (min): 60 min     Hospital Problem: Principal Problem:   Embolic cerebral infarction [facility])   Past Medical History:  Past Medical History:  Diagnosis Date   Asthma    Chronic airway obstruction, not elsewhere classified    Diastolic dysfunction without heart failure    Disorder of bone and cartilage, unspecified    Dysrhythmia    A-Fib   Family history of colon cancer    Family history of kidney cancer    Family history of ovarian cancer    Paroxysmal atrial flutter ([place])    Stroke ([place])    TIA   TIA (transient ischemic attack)    Unspecified asthma(493.90)    Past Surgical History:  Past Surgical History:  Procedure Laterality Date   ABDOMINAL HYSTERECTOMY     BREAST LUMPECTOMY Right 12/30/2017   BREAST LUMPECTOMY WITH RADIOACTIVE SEED LOCALIZATION Right 12/30/2017   Procedure: BREAST LUMPECTOMY WITH RADIOACTIVE SEED LOCALIZATION X'S 3;  Surgeon: Rolm Bookbinder, MD;  Location: [facility] OR;  Service: General;  Laterality: Right;   BUNIONECTOMY Bilateral    EYE SURGERY Bilateral    cataract removal    Assessment & Plan Clinical Impression: Patient is a 81 y.o. year old female with history of TIA, atrial fibrillation maintained on Xarelto followed by Dr. Marlou Porch, COPD followed by Dr. Annamaria Boots, diastolic congestive heart failure, remote history of right breast cancer with lumpectomy radioactive seed localization 2019 followed by Dr. Lindi Adie, quit smoking 44 years ago.  Per chart review patient  lives alone.  Two-level home.  Independent prior to admission. Good family support. Presented 06/28/2021 after a fall while walking from the foot of her bed striking her left knee with noted left-sided weakness and slurred speech.  MRI showed acute infarct within bilateral cerebral hemispheres, right basal ganglia, posterior limb of right internal capsule, left thalamus and within the bilateral cerebellar hemispheres.  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  MRA of head showed no large vessel occlusion or high-grade stenosis.  MRA of the neck without hemodynamically significant stenosis.  CT of the left knee showed small joint effusion as well as tricompartmental degenerative osteoarthritis changes mostly pronounced at the lateral compartment.  Admission chemistries unremarkable except glucose 113, urine drug screen negative, urinalysis negative nitrite, CK 236.  Echocardiogram with ejection fraction of 60 to 65% no wall motion abnormalities grade 1 diastolic dysfunction.  Neurology follow-up and she was transition from [place] to [place].  Tolerating a regular diet.  Therapy evaluations completed due to patient's left-sided weakness was admitted for a comprehensive rehab program. Patient transferred to CIR on 07/01/2021 .    Patient currently requires  mod A  with basic self-care skills secondary to muscle weakness, decreased cardiorespiratoy endurance, impaired timing and sequencing, unbalanced muscle activation, decreased coordination, and decreased motor planning, decreased problem solving and decreased memory, and decreased standing balance, decreased postural control, hemiplegia, and decreased balance strategies.  Prior to hospitalization, patient could complete BADL/IADL/ mobility with independent .  Patient will benefit from skilled intervention to decrease level of assist with basic self-care skills, increase independence with basic self-care skills, and  increase level of independence  with iADL prior to discharge home with care partner.  Anticipate patient will require intermittent supervision and follow up outpatient.  OT - End of Session Activity Tolerance: Tolerates 30+ min activity with multiple rests Endurance Deficit: Yes OT Assessment Rehab Potential (ACUTE ONLY): Good OT Barriers to Discharge: Decreased caregiver support;Inaccessible home environment;Home environment access/layout;Insurance for SNF coverage OT Patient demonstrates impairments in the following area(s): Balance;Cognition;Endurance;Motor;Perception OT Basic ADL's Functional Problem(s): Grooming;Bathing;Dressing;Toileting OT Transfers Functional Problem(s): Toilet;Tub/Shower OT Additional Impairment(s): Fuctional Use of Upper Extremity OT Plan OT Intensity: Minimum of 1-2 x/day, 45 to 90 minutes OT Frequency: 5 out of 7 days OT Duration/Estimated Length of Stay: 14 to 16 days. OT Treatment/Interventions: Balance/vestibular training;Disease mangement/prevention;Neuromuscular re-education;Self Care/advanced ADL retraining;Therapeutic Exercise;Wheelchair propulsion/positioning;UE/LE Strength taining/ROM;Skin care/wound managment;Pain management;DME/adaptive equipment instruction;Cognitive remediation/compensation;Community reintegration;Functional electrical stimulation;Patient/family education;UE/LE Coordination activities;Therapeutic Activities;Psychosocial support;Functional mobility training;Discharge planning;Splinting/orthotics OT Self Feeding Anticipated Outcome(s): set-up A OT Basic Self-Care Anticipated Outcome(s): S OT Toileting Anticipated Outcome(s): S OT Bathroom Transfers Anticipated Outcome(s): S OT Recommendation: OP OT Patient destination: Home Equipment Recommended: To be determined   OT Evaluation Precautions/Restrictions  Precautions Precautions: Fall Precaution Comments: mild L hemi, wound on L knee Restrictions Weight Bearing Restrictions: No General Chart Reviewed:  Yes Response to Previous Treatment: Not applicable Family/Caregiver Present: No  Pain Pain Assessment Pain Scale: 0-10 Home Living/Prior Functioning Home Living Family/patient expects to be discharged to:: Private residence Living Arrangements: Alone Available Help at Discharge: Family, Available 24 hours/day (to stay with local son and his wife) Type of Home: Other(Comment) Home Access: Stairs to enter [employer] of Steps: 2 Entrance Stairs-Rails: Right Home Layout: Other (Comment), Bed/bath upstairs, 1/2 bath on main level (townhome) Alternate Level Stairs-Number of Steps: flight Alternate Level Stairs-Rails: Left, Right Bathroom Shower/Tub: Gaffer, Chiropodist: Standard Bathroom Accessibility: Yes  Lives With: Alone IADL History Homemaking Responsibilities: Yes Meal Prep Responsibility: Primary Laundry Responsibility: Primary Cleaning Responsibility: Primary Bill Paying/Finance Responsibility: Primary Shopping Responsibility: Primary Child Care Responsibility: Secondary Occupation: Retired Type of Occupation: Theme park manager Leisure and Hobbies: taking care of son's dog, going to the gym Prior Function Level of Independence: Independent with basic ADLs, Independent with gait, Independent with homemaking with ambulation, Independent with transfers  Able to Take Stairs?: Yes Driving: Yes Vision Baseline Vision/History: 1 Wears glasses (readers) Ability to See in Adequate Light: 0 Adequate Patient Visual Report: No change from baseline Vision Assessment?: Yes Eye Alignment: Within Functional Limits Ocular Range of Motion: Within Functional Limits Alignment/Gaze Preference: Within Defined Limits Tracking/Visual Pursuits: Able to track stimulus in all quads without difficulty Saccades: Decreased speed of saccadic movement;Additional head turns occurred during testing Convergence: Impaired - to be further tested in functional  context Visual Fields: No apparent deficits Perception  Perception: Within Functional Limits Praxis Praxis: Impaired Praxis Impairment Details: Motor planning Cognition Overall Cognitive Status: Impaired/Different from baseline (reports decreased STM) Arousal/Alertness: Awake/alert Orientation Level: Person;Place;Situation Person: Oriented Place: Oriented Situation: Oriented Year: 2023 Month: February Day of Week: Correct Memory: Impaired Memory Impairment: Storage deficit;Retrieval deficit Immediate Memory Recall: Sock;Blue;Bed Memory Recall Sock: Not able to recall Memory Recall Blue: Without Cue Memory Recall Bed: Without Cue Attention: Selective Selective Attention: Appears intact Awareness: Impaired Awareness Impairment: Emergent impairment Problem Solving: Impaired Problem Solving Impairment: Functional basic;Functional complex Safety/Judgment: Appears intact Sensation Sensation Light Touch: Appears Intact Hot/Cold: Appears Intact Proprioception: Impaired Detail Proprioception Impaired Details: Impaired LUE Stereognosis: Impaired Detail Stereognosis Impaired Details: Impaired LUE Coordination Gross Motor Movements are Fluid and Coordinated: No  Fine Motor Movements are Fluid and Coordinated: No Coordination and Movement Description: mild L hemi Finger Nose Finger Test: slowed, mild dysmetria 9 Hole Peg Test: R: 25 seconds; L: able to remove all and place one peg in in 3 min Motor  Motor Motor: Hemiplegia;Abnormal postural alignment and control Motor - Skilled Clinical Observations: mild L hemi  Trunk/Postural Assessment  Cervical Assessment Cervical Assessment: Within Functional Limits Thoracic Assessment Thoracic Assessment: Exceptions to [facility] (mild rounded shoulders) Lumbar Assessment Lumbar Assessment: Exceptions to [facility] (mild posterior pelvic tilt in sitting) Postural Control Postural Control: Deficits on evaluation (mild L lean in standing with no BUE  support)  Balance Balance Balance Assessed: Yes Static Sitting Balance Static Sitting - Balance Support: Feet supported;No upper extremity supported Static Sitting - Level of Assistance: 5: Stand by assistance Dynamic Sitting Balance Dynamic Sitting - Balance Support: Feet unsupported Dynamic Sitting - Level of Assistance: 5: Stand by assistance;4: Min assist Static Standing Balance Static Standing - Balance Support: Bilateral upper extremity supported;During functional activity Static Standing - Level of Assistance: 4: Min assist;3: Mod assist Dynamic Standing Balance Dynamic Standing - Balance Support: During functional activity;Bilateral upper extremity supported Dynamic Standing - Level of Assistance: 3: Mod assist Extremity/Trunk Assessment RUE Assessment RUE Assessment: Within Functional Limits LUE Assessment LUE Assessment: Exceptions to [facility] Strength Comments: 3+/5 throughout LUE Body System: Neuro Brunstrum levels for arm and hand: Arm;Hand Brunstrum level for arm: Stage IV Movement is deviating from synergy Brunstrum level for hand: Stage V Independence from basic synergies  Care Tool Care Tool Self Care Eating   Eating Assist Level: Set up assist    Oral Care    Oral Care Assist Level: Supervision/Verbal cueing    Bathing   Body parts bathed by patient: Face;Right arm;Left arm;Chest;Abdomen;Front perineal area;Right upper leg;Left upper leg;Right lower leg;Left lower leg Body parts bathed by helper: Buttocks   Assist Level: Minimal Assistance - Patient > 75%    Upper Body Dressing(including orthotics)   What is the patient wearing?:  (robe)   Assist Level: Moderate Assistance - Patient 50 - 74% (standing)    Lower Body Dressing (excluding footwear)   What is the patient wearing?: Underwear/pull up Assist for lower body dressing: Moderate Assistance - Patient 50 - 74%    Putting on/Taking off footwear   What is the patient wearing?: Non-skid slipper  socks;Ted hose Assist for footwear: Total Assistance - Patient < 25%       Care Tool Toileting Toileting activity   Assist for toileting: Moderate Assistance - Patient 50 - 74%     Care Tool Bed Mobility Roll left and right activity        Sit to lying activity        Lying to sitting on side of bed activity   Lying to sitting on side of bed assist level: the ability to move from lying on the back to sitting on the side of the bed with no back support.: Moderate Assistance - Patient 50 - 74%     Care Tool Transfers Sit to stand transfer   Sit to stand assist level: Moderate Assistance - Patient 50 - 74%    Chair/bed transfer   Chair/bed transfer assist level: Moderate Assistance - Patient 50 - 74%     Toilet transfer   Assist Level: Moderate Assistance - Patient 50 - 74%     Care Tool Cognition  Expression of Ideas and Wants Expression of Ideas and Wants: 4. Without difficulty (complex  and basic) - expresses complex messages without difficulty and with speech that is clear and easy to understand  Understanding Verbal and Non-Verbal Content Understanding Verbal and Non-Verbal Content: 4. Understands (complex and basic) - clear comprehension without cues or repetitions   Memory/Recall Ability Memory/Recall Ability : Current season;That he or she is in a hospital/hospital unit   Refer to Care Plan for [place] 1 OT Short Term Goal 1 (Week 1): Pt will complete toilet transfer with min A and LRAD. OT Short Term Goal 2 (Week 1): Pt will complete standing grooming task with min A. OT Short Term Goal 3 (Week 1): Pt will require no more than min cuing for LUE positioning.  Recommendations for other services: Therapeutic Recreation  Pet therapy, Kitchen group, Stress management, and Outing/community reintegration   Skilled Therapeutic Intervention ADL ADL Eating: Set up Where Assessed-Eating: Wheelchair Grooming: Setup;Supervision/safety Where  Assessed-Grooming: Wheelchair Upper Body Bathing: Minimal assistance Where Assessed-Upper Body Bathing: Edge of bed Lower Body Bathing: Moderate assistance Where Assessed-Lower Body Bathing: Edge of bed Upper Body Dressing: Moderate assistance Where Assessed-Upper Body Dressing: Standing at sink Lower Body Dressing: Moderate assistance Where Assessed-Lower Body Dressing: Standing at sink Toileting: Moderate assistance Where Assessed-Toileting: Bedside Commode Toilet Transfer: Moderate assistance Toilet Transfer Method: Stand pivot Tub/Shower Transfer: Not assessed Social research officer, government: Not assessed Mobility  Bed Mobility Bed Mobility: Supine to Sit Supine to Sit: Moderate Assistance - Patient 50-74% Transfers Sit to Stand: Moderate Assistance - Patient 50-74% Stand to Sit: Moderate Assistance - Patient 50-74%  Session Note: Pt received semi-reclined in bed, agreeable to OT eval, denies pain. Reviewed role of CIR OT, evaluation process, ADL/func mobility retraining, goals for therapy, and safety plan. Evaluation completed as documented above. SatO2 at 96% - 98% on RA throughout session. Total A to don B teds/socks at bed level. Came to sitting EOB with light max A to progress BLE off bed and to lift trunk, cues for sequencing. Stand-pivot > w/c on her R with mod A to power up and to facilitate upright posture + RW. Donned underwear with mod A for balance in stance and pulling up over L hip/in back. In stance donned robe with mod A for balance and threading LUE.   Completed 9HPT as documented above.  Pt left seated in w/c with safety belt placed, call bell in reach, and all immediate needs met.   Discharge Criteria: Patient will be discharged from OT if patient refuses treatment 3 consecutive times without medical reason, if treatment goals not met, if there is a change in medical status, if patient makes no progress towards goals or if patient is discharged from hospital.  The  above assessment, treatment plan, treatment alternatives and goals were discussed and mutually agreed upon: by patient  Volanda Napoleon MS, OTR/L  07/02/2021, 1:06 PM

## 2021-07-03 MED ORDER — SORBITOL 70 % SOLN: 30.0000 mL | Freq: Every day | Status: DC | PRN

## 2021-07-03 NOTE — Progress Notes (Signed)
PROGRESS NOTE   Subjective/Complaints:  Slept well, tolerated therpy, discussed weekend therapy schedule   ROS- neg CP, SOB , - Left knee pain   Objective:   No results found. Recent Labs    07/02/21 0504  WBC 7.1  HGB 11.5*  HCT 35.7*  PLT 270    Recent Labs    07/02/21 0504  NA 140  K 3.6  CL 108  CO2 24  GLUCOSE 112*  BUN 8  CREATININE 0.81  CALCIUM 8.7*     Intake/Output Summary (Last 24 hours) at 07/03/2021 1443 Last data filed at 07/02/2021 1809 Gross per 24 hour  Intake 780 ml  Output --  Net 780 ml         Physical Exam: Vital Signs Blood pressure 137/74, pulse 68, temperature (!) 97.3 F (36.3 C), temperature source Oral, resp. rate 18, height 5\' 10"  (1.778 m), weight 91.1 kg, SpO2 95 %.  General: No acute distress Mood and affect are appropriate Heart: Regular rate and rhythm no rubs murmurs or extra sounds Lungs: Clear to auscultation, breathing unlabored, no rales or wheezes Abdomen: Positive bowel sounds, soft nontender to palpation, nondistended Extremities: No clubbing, cyanosis, or edema Skin: Left pre patellar abrasion , no active leaking cont Xeroform + foam dressing  Neurologic: Cranial nerves II through XII intact, motor strength is 5/5 in RIgh tan d4/5 Left deltoid, bicep, tricep, grip, hip flexor, knee extensors, ankle dorsiflexor and plantar flexor Sensory exam normal sensation to light touch and  in bilateral upper and lower extremities  Musculoskeletal: Reduced ROM due to pain LLE     Assessment/Plan: 1. Functional deficits which require 3+ hours per day of interdisciplinary therapy in a comprehensive inpatient rehab setting. Physiatrist is providing close team supervision and 24 hour management of active medical problems listed below. Physiatrist and rehab team continue to assess barriers to discharge/monitor patient progress toward functional and medical goals  Care  Tool:  Bathing    Body parts bathed by patient: Face, Right arm, Left arm, Chest, Abdomen, Front perineal area, Right upper leg, Left upper leg, Right lower leg, Left lower leg   Body parts bathed by helper: Buttocks     Bathing assist Assist Level: Minimal Assistance - Patient > 75%     Upper Body Dressing/Undressing Upper body dressing   What is the patient wearing?: Hospital gown only    Upper body assist Assist Level: Minimal Assistance - Patient > 75%    Lower Body Dressing/Undressing Lower body dressing      What is the patient wearing?: Hospital gown only     Lower body assist Assist for lower body dressing: Minimal Assistance - Patient > 75%     Toileting Toileting    Toileting assist Assist for toileting: Moderate Assistance - Patient 50 - 74%     Transfers Chair/bed transfer  Transfers assist     Chair/bed transfer assist level: Moderate Assistance - Patient 50 - 74%     Locomotion Ambulation   Ambulation assist      Assist level: Minimal Assistance - Patient > 75% Assistive device: Walker-rolling Max distance: 20   Walk 10 feet activity   Assist  Assist level: Minimal Assistance - Patient > 75% Assistive device: Walker-rolling   Walk 50 feet activity   Assist Walk 50 feet with 2 turns activity did not occur: Refused         Walk 150 feet activity   Assist Walk 150 feet activity did not occur: Safety/medical concerns         Walk 10 feet on uneven surface  activity   Assist Walk 10 feet on uneven surfaces activity did not occur: Safety/medical concerns         Wheelchair     Assist Is the patient using a wheelchair?: No Type of Wheelchair: Manual    Wheelchair assist level: Minimal Assistance - Patient > 75% Max wheelchair distance: 150    Wheelchair 50 feet with 2 turns activity    Assist        Assist Level: Minimal Assistance - Patient > 75%   Wheelchair 150 feet activity      Assist      Assist Level: Minimal Assistance - Patient > 75%   Blood pressure 137/74, pulse 68, temperature (!) 97.3 F (36.3 C), temperature source Oral, resp. rate 18, height 5\' 10"  (1.778 m), weight 91.1 kg, SpO2 95 %.  Medical Problem List and Plan: 1. Functional deficits secondary to acute infarct bilateral cerebral hemispheres, right basal ganglia posterior limb right internal capsule left thalamus/embolic shower likely due to embolism source with A-fib failed Xarelto now on Eliquis               -patient may shower             -ELOS/Goals: 10-14 days modI             -CIR PT,OT, SLP  2.  Antithrombotics: -DVT/anticoagulation:  Pharmaceutical: Other (comment) Eliquis             -antiplatelet therapy: N/A 3. Left knee pain: Recommended applying ice 15 minutes three times per day.Tylenol as needed 4. Mood: Provide emotional support             -antipsychotic agents: N/A 5. Neuropsych: This patient is capable of making decisions on her own behalf. 6. Skin/Wound Care: Routine skin checks 7. Fluids/Electrolytes/Nutrition: Routine in and outs with follow-up chemistries 8.  Hyperlipidemia.  Continue Lipitor 9.  not on antihypertensive meds at home , will cont to monitor , normotensive today  Vitals:   07/02/21 2007 07/03/21 0347  BP: (!) 142/77 137/74  Pulse: 66 68  Resp: 20 18  Temp: 98.1 F (36.7 C) (!) 97.3 F (36.3 C)  SpO2: 96% 95%   Controlled BP 2/10 10.  History of breast cancer.  Continue Femara 11.  COPD/asthma.  Monitor oxygen saturations every shift.  Continue nebulizer as needed.  Follow-up Dr. Annamaria Boots 12.  Atrial fibrillation.  Cardiac rate controlled.  Continue Eliquis.  Follow-up Dr. Marlou Porch- rate controlled no BB 13.  Diastolic congestive heart failure.  No signs of fluid overload 14. Overweight: BMI 28.82- provide dietary education    LOS: 2 days A FACE TO FACE EVALUATION WAS PERFORMED  Charlett Blake 07/03/2021, 7:22 AM

## 2021-07-03 NOTE — Progress Notes (Signed)
Occupational Therapy Session Note  Patient Details  Name: Laura Mendez MRN: [id number] Date of Birth: 1941/01/24  Today's Date: 07/03/2021 OT Individual Time: 3524-8185 OT Individual Time Calculation (min): 53 min    Short Term Goals: Week 1:  OT Short Term Goal 1 (Week 1): Pt will complete toilet transfer with min A and LRAD. OT Short Term Goal 2 (Week 1): Pt will complete standing grooming task with min A. OT Short Term Goal 3 (Week 1): Pt will require no more than min cuing for LUE positioning.  Skilled Therapeutic Interventions/Progress Updates:    Pt received seated in w/c, reports ongoing L knee pain but, agreeable to therapy. Session focus on self-care retraining, activity tolerance, transfer retraining, LUE NMR/FMC in prep for improved ADL/IADL/func mobility performance + decreased caregiver burden.  Ambulated to and from sink with overall min A. Step to gait pattern due to L knee pain and pt not full Wbing through LLE. Completed oral care in stance with CGA. Total A w/c transport to and from gym.  Pt participated in various L FMC/grasp strengthening activities including: therex with yellow theraputty: putty squeezes, rolling into log shape, digit extension, pinch and pull via pincer grasp. Placing and removing resistive clothes pin on horizontal bar levels 1-3 with palmar grasp. Completed 2x10 of the following to target LUE NMR + bimanual integration with 2 lb dowel rod: forward/backward circles, B shoulder flexion, chest press.  Reviewed common s/sx of CVA and modifiable risk factors, reports having blood pressure cuff at home, but hasn't used.  Pt left seated in w/c with safety belt call bell in reach, and all immediate needs met.    Therapy Documentation Precautions:  Precautions Precautions: Fall Precaution Comments: mild L hemi, wound on L knee Restrictions Weight Bearing Restrictions: No  Pain: see session note   ADL: See Care Tool for more  details.  Therapy/Group: Individual Therapy  Volanda Napoleon MS, OTR/L  07/03/2021, 6:52 AM

## 2021-07-03 NOTE — IPOC Note (Addendum)
Overall Plan of Care [facility]) Patient Details Name: Laura Mendez MRN: [id number] DOB: [birth year]  Admitting Diagnosis: Embolic cerebral infarction [facility])  Hospital Problems: Principal Problem:   Embolic cerebral infarction [facility])     Functional Problem List: Nursing Safety, Medication Management, Endurance  PT Balance, Behavior, Edema, Endurance, Nutrition, Pain, Motor, Safety, Skin Integrity  OT Balance, Cognition, Endurance, Motor, Perception  SLP Cognition  TR         Basic ADLs: OT Grooming, Bathing, Dressing, Toileting     Advanced  ADLs: OT       Transfers: PT Bed Mobility, Bed to Chair, Car, [employer], Floor  OT Toilet, Metallurgist: PT Ambulation, Emergency planning/management officer, Stairs     Additional Impairments: OT Fuctional Use of Upper Extremity  SLP Social Cognition   Memory, Awareness  TR      Anticipated Outcomes Item Anticipated Outcome  Self Feeding set-up A  Swallowing      Basic self-care  S  Toileting  S   Bathroom Transfers S  Bowel/Bladder  N/A  Transfers  Mod I with LRAD  Locomotion  Supervision asist with LRAD  Communication  spv  Cognition  Spv  Pain  N/A  Safety/Judgment  maintain safety w cues   Therapy Plan: PT Intensity: Minimum of 1-2 x/day ,45 to 90 minutes PT Frequency: 5 out of 7 days PT Duration Estimated Length of Stay: 12-16 days OT Intensity: Minimum of 1-2 x/day, 45 to 90 minutes OT Frequency: 5 out of 7 days OT Duration/Estimated Length of Stay: 14 to 16 days. SLP Intensity: Minumum of 1-2 x/day, 30 to 90 minutes SLP Frequency: 3 to 5 out of 7 days SLP Duration/Estimated Length of Stay: 10-14 days   Due to the current state of emergency, patients may not be receiving their 3-hours of Medicare-mandated therapy.   Team Interventions: Nursing Interventions Disease Management/Prevention, Medication Management, Discharge Planning, Patient/Family Education  PT interventions Ambulation/gait  training, Training and development officer, Cognitive remediation/compensation, Disease management/prevention, Discharge planning, Community reintegration, Neuromuscular re-education, Functional electrical stimulation, Functional mobility training, Patient/family education, Pain management, DME/adaptive equipment instruction, Psychosocial support, Skin care/wound management, Splinting/orthotics, Therapeutic Exercise, Stair training, UE/LE Strength taining/ROM, Visual/perceptual remediation/compensation, UE/LE Coordination activities, Therapeutic Activities, Wheelchair propulsion/positioning  OT Interventions Balance/vestibular training, Disease mangement/prevention, Neuromuscular re-education, Self Care/advanced ADL retraining, Therapeutic Exercise, Wheelchair propulsion/positioning, UE/LE Strength taining/ROM, Skin care/wound managment, Pain management, DME/adaptive equipment instruction, Cognitive remediation/compensation, Community reintegration, Technical sales engineer stimulation, Patient/family education, UE/LE Coordination activities, Therapeutic Activities, Psychosocial support, Functional mobility training, Discharge planning, Splinting/orthotics  SLP Interventions Cognitive remediation/compensation, Internal/external aids, English as a second language teacher, Environmental controls, Therapeutic Activities, Functional tasks, Patient/family education, Therapeutic Exercise  TR Interventions    SW/CM Interventions Discharge Planning, Psychosocial Support, Patient/Family Education, Disease Management/Prevention   Barriers to Discharge MD  Medical stability  Nursing Decreased caregiver support, Home environment access/layout 2 level townhome, main B+B w son  PT Inaccessible home environment, Decreased caregiver support, Home environment access/layout, Wound Care, Insurance for SNF coverage    OT Decreased caregiver support, Inaccessible home environment, Home environment Child psychotherapist, Insurance underwriter for SNF coverage    [place] for SNF coverage     Team Discharge Planning: Destination: PT-Home ,OT- Home , SLP- Home Projected Follow-up: PT-Home health PT, OT-  Outpatient OT, SLP-Outpatient SLP Projected Equipment Needs: PT-To be determined, OT- To be determined, SLP-None recommended by SLP Equipment Details: PT- , OT-  Patient/family involved in discharge planning: PT- Patient,  OT-Patient, SLP-Patient  MD ELOS: 10  to 14 days Medical Rehab Prognosis:  Good Assessment: 81 year old right-handed female with history of TIA, atrial fibrillation maintained on Xarelto followed by Dr. Marlou Porch, COPD followed by Dr. Annamaria Boots, diastolic congestive heart failure, remote history of right breast cancer with lumpectomy radioactive seed localization 2019 followed by Dr. Lindi Adie, quit smoking 44 years ago.  Per chart review patient lives alone.  Two-level home.  Independent prior to admission. Good family support. Presented 06/28/2021 after a fall while walking from the foot of her bed striking her left knee with noted left-sided weakness and slurred speech.  MRI showed acute infarct within bilateral cerebral hemispheres, right basal ganglia, posterior limb of right internal capsule, left thalamus and within the bilateral cerebellar hemispheres.  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  MRA of head showed no large vessel occlusion or high-grade stenosis.  MRA of the neck without hemodynamically significant stenosis.  CT of the left knee showed small joint effusion as well as tricompartmental degenerative osteoarthritis changes mostly pronounced at the lateral compartment.  Admission chemistries unremarkable except glucose 113, urine drug screen negative, urinalysis negative nitrite, CK 236.  Echocardiogram with ejection fraction of 60 to 65% no wall motion abnormalities grade 1 diastolic dysfunction.  Neurology follow-up and she was transition from [place] [facility] [place].     See Team Conference Notes for weekly updates  to the plan of care

## 2021-07-03 NOTE — Progress Notes (Signed)
Physical Therapy Session Note  Patient Details  Name: Laura Mendez MRN: [id number] Date of Birth: August 25, 1940  Today's Date: 07/03/2021 PT Individual Time: 1445-1555 PT Individual Time Calculation (min): 70 min   Short Term Goals: Week 1:  PT Short Term Goal 1 (Week 1): pt will perform bed mobility with supeivsion assist PT Short Term Goal 2 (Week 1): Pt will consistenly perform stand pivot trasnfers with RW nad CGA PT Short Term Goal 3 (Week 1): Pt will ambulate 68ft with CGA and LRAD PT Short Term Goal 4 (Week 1): Pt will ascend 4 steps with min assist and 1 rail  Skilled Therapeutic Interventions/Progress Updates:  Patient seated upright in w/c on entrance to room. Patient fatigued and not immeidately willing to work. Requires time spent building therapeutic alliance and educating on importance of continued therapy even into fatigue with careful, watchful eye for safety. Then pt alert and agreeable to PT session although relates that she has had a big day already and is very tired.  Patient with no pain complaint throughout session.  Therapeutic Activity: Transfers: Patient performed sit<>stand and stand pivot transfers throughout session with MinA initially and improving to CGA. Provided verbal cues for technique in forward lean and extensor push into upright positioning.   Neuromuscular Re-ed: NMR facilitated during session with focus on sit<>stand performance and technique. Pt guided in NDT cueing for improving forward lean, anterior weight shift to feet/ toes. With improving technique, pt is able to rise to stand with CGA to HHA from therapist to steady self for standing balance. Sanding balance training with pt able to find balance and maintain for up to 75min with no UE support. Can even withstand min perturbations with no LOB.   To promote increased anterior weight shifting, pt guided in ant weight shifting withuse of 2# weighted bar touches to target held by therapist just  outside of BOS requiring dynamic forward lean and balance.   NMR performed for improvements in motor control and coordination, balance, sequencing, judgement, and self confidence/ efficacy in performing all aspects of mobility at highest level of independence.   Therapeutic Exercise: Pt guided in continuous reciprocation of BUE and BLE using NuStep L2 x 44min with focus on maintaining pace and full UE/ LE movements into extension/ flexion. Pt is able to maintain ~48 steps/ min pace, completing 156 steps at 1.6MET effort.   Patient seated upright  in w/c at end of session with brakes locked, belt alarm set, and all needs within reach. No green alarm box located so st==   Therapy Documentation Precautions:  Precautions Precautions: Fall Precaution Comments: mild L hemi, wound on L knee Restrictions Weight Bearing Restrictions: No General:   Vital Signs: Therapy Vitals Temp: 98.1 F (36.7 C) Temp Source: Oral Pulse Rate: 70 Resp: 17 BP: 105/80 Patient Position (if appropriate): Sitting Oxygen Therapy SpO2: 96 % O2 Device: Room Air Pain:  No pain complaint this session, but does c/o fatigue.   Therapy/Group: Individual Therapy  Alger Simons PT, DPT 07/03/2021, 4:24 PM

## 2021-07-03 NOTE — Progress Notes (Signed)
Physical Therapy Session Note  Patient Details  Name: Laura Mendez MRN: [id number] Date of Birth: 1941/03/09  Today's Date: 07/03/2021 PT Individual Time: 0900-1000 PT Individual Time Calculation (min): 60 min   Short Term Goals: Week 1:  PT Short Term Goal 1 (Week 1): pt will perform bed mobility with supeivsion assist PT Short Term Goal 2 (Week 1): Pt will consistenly perform stand pivot trasnfers with RW nad CGA PT Short Term Goal 3 (Week 1): Pt will ambulate 48ft with CGA and LRAD PT Short Term Goal 4 (Week 1): Pt will ascend 4 steps with min assist and 1 rail  Skilled Therapeutic Interventions/Progress Updates:   Pt received supine in bed and agreeable to PT. Donning Ted hose  Supine>sit transfer with supervision assist and cues for improved attention to the LLE.   Gait training with RW 2 x 80ft with min assist for safety and cues for increased step length on the RLE to force activation of L side knee/hip extension. Noted to have improved step length and cadence with increased distance.   Patient demonstrates increased fall risk as noted by score of   20/56 on Berg Balance Scale.  (<36= high risk for falls, close to 100%; 37-45 significant >80%; 46-51 moderate >50%; 52-55 lower >25%)   Pt performed 5 time sit<>stand (5xSTS): 51 sec (>15 sec indicates increased fall risk)        Therapy Documentation Precautions:  Precautions Precautions: Fall Precaution Comments: mild L hemi, wound on L knee Restrictions Weight Bearing Restrictions: No  Vital Signs: Therapy Vitals Temp: 98.5 F (36.9 C) Temp Source: Oral Pulse Rate: 76 Resp: 16 BP: 130/65 Patient Position (if appropriate): Lying Oxygen Therapy SpO2: 95 % O2 Device: Room Air Pain: denies    Therapy/Group: Individual Therapy  [name] 07/03/2021, 11:32 PM

## 2021-07-04 NOTE — Progress Notes (Signed)
Physical Therapy Session Note  Patient Details  Name: Laura Mendez MRN: [id number] Date of Birth: Dec 30, 1940  Today's Date: 07/04/2021 PT Individual Time: 1014-1109 PT Individual Time Calculation (min): 55 min   Short Term Goals: Week 1:  PT Short Term Goal 1 (Week 1): pt will perform bed mobility with supeivsion assist PT Short Term Goal 2 (Week 1): Pt will consistenly perform stand pivot trasnfers with RW nad CGA PT Short Term Goal 3 (Week 1): Pt will ambulate 79ft with CGA and LRAD PT Short Term Goal 4 (Week 1): Pt will ascend 4 steps with min assist and 1 rail  Skilled Therapeutic Interventions/Progress Updates:    Pt received sitting in w/c with her son, Al, exiting upon therapist arrival. Pt agreeable to therapy session.  Transported to/from gym in w/c for time management and energy conservation.   Sit>stand w/c>RW with 3x attempts prior to coming to stand due to pt having difficulty powering up from sitting - pt self initiates scooting forward to front of seat to increase independence - heavy min assist for lifting to stand.   Gait training 68ft x2 + 17ft using RW with CGA/light min assist - demos antalgic gait pattern due to L knee pain from where she fell on it prior to admissions and where she has a skin tear - slow gait speed with more of a step-to pattern leading with L LE with decreased L LE foot clearance - during 2nd walk pt demos improving upright posture and increasing step lengths - by 3rd walk pt continuing to demo improvement; however, L knee starts to slightly give way towards end but no true buckling and pt able to maintain upright without increased assist.  Seated L LE long arc quads during rest break while pt caught her breath x10 reps  Pt reports hx of asthma - SpO2 95% after gait training and maintained >94% throughout session as she does demo labored breathing after gait due to impaired endurance.  Repeated sit<>stands to/from elevated mat (23in height) to  RW with CGA x5 reps - pt does best by pushing up with both hands when going from sit>stand - requires cuing to bring trunk forward while lowering to sit as pt with poor eccentric control   Lowered mat to 21in height x5 reps again with CGA and reinforcing the above cuing.  Gait training ~20ft back to w/c using RW with CGA/light min assist and pt continuing to demo excessive trunk flexion with increased WBing down through B UEs due to antalgic gait pattern with L LE knee pain/discomfort - achieving more of a reciprocal pattern though continues to have decreased L stance time.   Pt reports fatigue. Transported back to her room and left seated in w/c with needs in reach and nurse arriving to assist pt with toileting.    Therapy Documentation Precautions:  Precautions Precautions: Fall Precaution Comments: mild L hemi, wound on L knee Restrictions Weight Bearing Restrictions: No   Pain:  Pain in L knee joint and at L knee skin tear with pt reporting the skin tear is the most uncomfortable of the 2 - pt's wound was covered with a dressing prior to therapist's arrival - seated rest breaks provided throughout session for pain management.   Therapy/Group: Individual Therapy  Tawana Scale , PT, DPT, NCS, CSRS  07/04/2021, 8:15 AM

## 2021-07-04 NOTE — Progress Notes (Signed)
Speech Language Pathology Daily Session Note  Patient Details  Name: Laura Mendez MRN: [id number] Date of Birth: Jun 11, 1940  Today's Date: 07/04/2021 SLP Individual Time: 1300-1345 SLP Individual Time Calculation (min): 45 min  Short Term Goals: Week 1: SLP Short Term Goal 1 (Week 1): Patient will recall 2-3 speech intelligibility strategies to increase intelligibility when fatigued with minA verbal cues. SLP Short Term Goal 2 (Week 1): Patient will recall and demonstrate use of memory compensations to increase independence and safety at home with minA verbal cues. SLP Short Term Goal 3 (Week 1): Patient will identify 2-3 areas of impairment to increase awareness of deficits with minA verbal cues.  Skilled Therapeutic Interventions: Pt seen for skilled ST with focus on cognitive goals, pt upright in wheelchair and agreeable to skilled ST. Pt reports improving cognitive function everyday, feels close to baseline but is noting some higher level word finding deficits. With min A, pt able to ID 1-2 additional areas of change s/p CVA that will impact function at this time. SLP facilitating moderately complex problem solving task by providing overall min A verbal cues for 90% accuracy. Pt does have good support system at home that can help with complex cognitive tasks, however she is motivated to continue independent management of her life. Pt left in wheelchair with call button within reach. Cont ST POC.   Pain Pain Assessment Pain Scale: 0-10 Pain Score: 0-No pain  Therapy/Group: Individual Therapy  Dewaine Conger 07/04/2021, 3:36 PM

## 2021-07-04 NOTE — Plan of Care (Signed)
°  Problem: Consults Goal: RH STROKE PATIENT EDUCATION Description: See Patient Education module for education specifics  Outcome: Progressing   Problem: RH KNOWLEDGE DEFICIT Goal: RH STG INCREASE KNOWLEDGE OF STROKE PROPHYLAXIS Description: Patient will be able to manage secondary stroke risks with medications and dietary modifications using handouts and educational resources independently Outcome: Progressing

## 2021-07-04 NOTE — Plan of Care (Signed)
°  Problem: Consults Goal: RH STROKE PATIENT EDUCATION Description: See Patient Education module for education specifics  07/04/2021 0514 by Greggory Stallion, Thurston Hole, RN Outcome: Progressing 07/04/2021 0513 by Henrine Screws, RN Outcome: Progressing   Problem: RH KNOWLEDGE DEFICIT Goal: RH STG INCREASE KNOWLEDGE OF STROKE PROPHYLAXIS Description: Patient will be able to manage secondary stroke risks with medications and dietary modifications using handouts and educational resources independently 07/04/2021 0514 by Henrine Screws, RN Outcome: Progressing 07/04/2021 0513 by Henrine Screws, RN Outcome: Progressing

## 2021-07-04 NOTE — Progress Notes (Signed)
Occupational Therapy Session Note  Patient Details  Name: Laura Mendez MRN: [id number] Date of Birth: Sep 10, 1940  Today's Date: 07/04/2021 OT Individual Time: 9688-6484 OT Individual Time Calculation (min): 71 min    Short Term Goals: Week 1:  OT Short Term Goal 1 (Week 1): Pt will complete toilet transfer with min A and LRAD. OT Short Term Goal 2 (Week 1): Pt will complete standing grooming task with min A. OT Short Term Goal 3 (Week 1): Pt will require no more than min cuing for LUE positioning.  Skilled Therapeutic Interventions/Progress Updates:    Pt received semi-reclined in bed with son present, no c/o pain, agreeable to therapy. Session focus on self-care retraining, activity tolerance, LUE NMR/FMC, transfer retraining in prep for improved ADL/IADL/func mobility performance + decreased caregiver burden. MD in/out morning assessment. Covered L knee bandage with plastic, pt came to sitting EOB with close S and increased time. Ambulated > toilet > TTB with light min A to power up from bed and use of RW. Required heavier min A to stand-up from low TTB. Completed toileting tasks close S post continent void of bladder. Bathed full-body while seated on TTB, able to reach periarea via lateral leans.   Donned underwear/pants with min A to thread LLE, min A for sit to stand from elevated surface. Set-up A to don shirt, apply deodorant, and to open soap for shower due to L grip weakness.  Practiced placing and removing small beads into theraputty with L hand; able to complete with increased time. Stand-pivot > w/c with light min A to power up from bed.  Pt left seated in w/c with son present with safety belt alarm engaged, call bell in reach, and all immediate needs met.    Therapy Documentation Precautions:  Precautions Precautions: Fall Precaution Comments: mild L hemi, wound on L knee Restrictions Weight Bearing Restrictions: No  Pain:  See session note ADL: See Care Tool  for more details.  Therapy/Group: Individual Therapy  Volanda Napoleon MS, OTR/L  07/04/2021, 6:53 AM

## 2021-07-05 NOTE — Progress Notes (Signed)
Speech Language Pathology Daily Session Note  Patient Details  Name: Laura Mendez MRN: [id number] Date of Birth: 18-Apr-1941  Today's Date: 07/05/2021 SLP Individual Time: 0935-1030 SLP Individual Time Calculation (min): 55 min  Short Term Goals: Week 1: SLP Short Term Goal 1 (Week 1): Patient will recall 2-3 speech intelligibility strategies to increase intelligibility when fatigued with minA verbal cues. SLP Short Term Goal 2 (Week 1): Patient will recall and demonstrate use of memory compensations to increase independence and safety at home with minA verbal cues. SLP Short Term Goal 3 (Week 1): Patient will identify 2-3 areas of impairment to increase awareness of deficits with minA verbal cues.  Skilled Therapeutic Interventions: Skilled SLP intervention focused on cognition. Pt completed mental time calculations related to schedule with min A. She demonstrated good awareness of current physical limitations and asked relavant questions about assistance she will need at home following rehab stay. Pt completed calendar organization task with supervision A and occasional verba lcues to reason information that should be included on calendar for recall/referencing later. She answered moderately complex problem solving questions about information on calendar with supervision A. Cont with therapy per plan of care.   Pain Pain Assessment Pain Scale: Faces Faces Pain Scale: No hurt  Therapy/Group: Individual Therapy  Darrol Poke Shalik Sanfilippo 07/05/2021, 10:34 AM

## 2021-07-05 NOTE — Progress Notes (Signed)
PROGRESS NOTE   Subjective/Complaints:  Pt reports did well yesterday- feels better today- likes the food.  Raised in [place], so feels like food great.    ROS- Pt denies SOB, abd pain, CP, N/V/C/D, and vision changes    Objective:   No results found. No results for input(s): WBC, HGB, HCT, PLT in the last 72 hours. No results for input(s): NA, K, CL, CO2, GLUCOSE, BUN, CREATININE, CALCIUM in the last 72 hours.  Intake/Output Summary (Last 24 hours) at 07/05/2021 1419 Last data filed at 07/05/2021 1300 Gross per 24 hour  Intake 720 ml  Output --  Net 720 ml        Physical Exam: Vital Signs Blood pressure 119/77, pulse 95, temperature 98 F (36.7 C), resp. rate 16, height 5\' 10"  (1.778 m), weight 91.1 kg, SpO2 95 %.   General: awake, alert, appropriate, NAD HENT: conjugate gaze; oropharynx moist CV: regular rate; no JVD Pulmonary: CTA B/L; no W/R/R- good air movement GI: soft, NT, ND, (+)BS Psychiatric: appropriate Neurological: Ox3  Extremities: No clubbing, cyanosis, or edema Skin: Left pre patellar abrasion , no active leaking cont Xeroform + foam dressing -covered for shower Neurologic: Cranial nerves II through XII intact, motor strength is 5/5 in RIgh tan d4/5 Left deltoid, bicep, tricep, grip, hip flexor, knee extensors, ankle dorsiflexor and plantar flexor Sensory exam normal sensation to light touch and  in bilateral upper and lower extremities  Musculoskeletal: Reduced ROM due to pain LLE     Assessment/Plan: 1. Functional deficits which require 3+ hours per day of interdisciplinary therapy in a comprehensive inpatient rehab setting. Physiatrist is providing close team supervision and 24 hour management of active medical problems listed below. Physiatrist and rehab team continue to assess barriers to discharge/monitor patient progress toward functional and medical goals  Care Tool:  Bathing     Body parts bathed by patient: Face, Right arm, Left arm, Chest, Abdomen, Front perineal area, Right upper leg, Left upper leg, Right lower leg, Left lower leg   Body parts bathed by helper: Buttocks     Bathing assist Assist Level: Minimal Assistance - Patient > 75%     Upper Body Dressing/Undressing Upper body dressing   What is the patient wearing?: Hospital gown only    Upper body assist Assist Level: Minimal Assistance - Patient > 75%    Lower Body Dressing/Undressing Lower body dressing      What is the patient wearing?: Hospital gown only     Lower body assist Assist for lower body dressing: Minimal Assistance - Patient > 75%     Toileting Toileting    Toileting assist Assist for toileting: Moderate Assistance - Patient 50 - 74%     Transfers Chair/bed transfer  Transfers assist     Chair/bed transfer assist level: Minimal Assistance - Patient > 75% Chair/bed transfer assistive device: Walker, Clinical biochemist   Ambulation assist      Assist level: Minimal Assistance - Patient > 75% Assistive device: Walker-rolling Max distance: 5ft   Walk 10 feet activity   Assist     Assist level: Minimal Assistance - Patient > 75% Assistive device: Walker-rolling  Walk 50 feet activity   Assist Walk 50 feet with 2 turns activity did not occur: Refused         Walk 150 feet activity   Assist Walk 150 feet activity did not occur: Safety/medical concerns         Walk 10 feet on uneven surface  activity   Assist Walk 10 feet on uneven surfaces activity did not occur: Safety/medical concerns         Wheelchair     Assist Is the patient using a wheelchair?: No Type of Wheelchair: Manual    Wheelchair assist level: Minimal Assistance - Patient > 75% Max wheelchair distance: 150    Wheelchair 50 feet with 2 turns activity    Assist        Assist Level: Minimal Assistance - Patient > 75%   Wheelchair  150 feet activity     Assist      Assist Level: Minimal Assistance - Patient > 75%   Blood pressure 119/77, pulse 95, temperature 98 F (36.7 C), resp. rate 16, height 5\' 10"  (1.778 m), weight 91.1 kg, SpO2 95 %.  Medical Problem List and Plan: 1. 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  Antithrombotics: -DVT/anticoagulation:  Pharmaceutical: Other (comment) Eliquis             -antiplatelet therapy: N/A 3. Left knee pain: Recommended applying ice 15 minutes three times per day.Tylenol as needed  2/12- pain controlled- con't regimen 4. Mood: Provide emotional support             -antipsychotic agents: N/A 5. Neuropsych: This patient is capable of making decisions on her own behalf. 6. Skin/Wound Care: Routine skin checks 7. Fluids/Electrolytes/Nutrition: Routine in and outs with follow-up chemistries 8.  Hyperlipidemia.  Continue Lipitor 9.  not on antihypertensive meds at home , will cont to monitor , normotensive today  Vitals:   07/05/21 0432 07/05/21 1321  BP: 136/65 119/77  Pulse: 64 95  Resp: 16 16  Temp: 98.7 F (37.1 C) 98 F (36.7 C)  SpO2: 94% 95%   2/12- BP controlled con't regimen 10.  History of breast cancer.  Continue Femara 11.  COPD/asthma.  Monitor oxygen saturations every shift.  Continue nebulizer as needed.  Follow-up Dr. Annamaria Boots 12.  Atrial fibrillation.  Cardiac rate controlled.  Continue Eliquis.  Follow-up Dr. Marlou Porch- rate controlled no BB 13.  Diastolic congestive heart failure.  No signs of fluid overload 14. Overweight: BMI 28.82- provide dietary education    LOS: 4 days A FACE TO FACE EVALUATION WAS PERFORMED  Gwen Sarvis 07/05/2021, 2:19 PM

## 2021-07-06 NOTE — Progress Notes (Signed)
Speech Language Pathology Daily Session Note  Patient Details  Name: An Lannan MRN: [id number] Date of Birth: 10-21-1940  Today's Date: 07/06/2021 SLP Individual Time: 8264-1583 SLP Individual Time Calculation (min): 50 min  Short Term Goals: Week 1: SLP Short Term Goal 1 (Week 1): Patient will recall 2-3 speech intelligibility strategies to increase intelligibility when fatigued with minA verbal cues. SLP Short Term Goal 2 (Week 1): Patient will recall and demonstrate use of memory compensations to increase independence and safety at home with minA verbal cues. SLP Short Term Goal 3 (Week 1): Patient will identify 2-3 areas of impairment to increase awareness of deficits with minA verbal cues.  Skilled Therapeutic Interventions: Skilled treatment session focused on cognitive goals. SLP facilitated session by providing extra time for complex problem solving during a medication management task. Patient verbalized appropriate problem solving/medication administration system for home regarding BID medications. Patient also completed a complex money management task of balancing a checkbook as she does at home with overall extra time. Patient left upright in wheelchair with alarm on. Continue with current plan of care.      Pain No/Denies Pain   Therapy/Group: Individual Therapy  Miriya Cloer 07/06/2021, 3:20 PM

## 2021-07-06 NOTE — Progress Notes (Signed)
PROGRESS NOTE   Subjective/Complaints:  No issues overnite, used commode for BM , wantsw to try toilet today   ROS- Pt denies SOB, abd pain, CP, N/V/C/D,   Objective:   No results found. No results for input(s): WBC, HGB, HCT, PLT in the last 72 hours. No results for input(s): NA, K, CL, CO2, GLUCOSE, BUN, CREATININE, CALCIUM in the last 72 hours.  Intake/Output Summary (Last 24 hours) at 07/06/2021 0751 Last data filed at 07/05/2021 2030 Gross per 24 hour  Intake 480 ml  Output --  Net 480 ml         Physical Exam: Vital Signs Blood pressure 130/67, pulse 69, temperature 98.6 F (37 C), temperature source Oral, resp. rate 16, height 5\' 10"  (1.778 m), weight 91.1 kg, SpO2 95 %.  General: No acute distress Mood and affect are appropriate Heart: Regular rate and rhythm no rubs murmurs or extra sounds Lungs: Clear to auscultation, breathing unlabored, no rales or wheezes Abdomen: Positive bowel sounds, soft nontender to palpation, nondistended  Extremities: No clubbing, cyanosis, or edema Skin: Left pre patellar abrasion , no active leaking cont Xeroform + foam dressing -covered for shower Neurologic: Cranial nerves II through XII intact, motor strength is 5/5 in RIgh tan d4/5 Left deltoid, bicep, tricep, grip, hip flexor, knee extensors, ankle dorsiflexor and plantar flexor Sensory exam normal sensation to light touch and  in bilateral upper and lower extremities  Musculoskeletal: Reduced ROM due to pain LLE     Assessment/Plan: 1. Functional deficits which require 3+ hours per day of interdisciplinary therapy in a comprehensive inpatient rehab setting. Physiatrist is providing close team supervision and 24 hour management of active medical problems listed below. Physiatrist and rehab team continue to assess barriers to discharge/monitor patient progress toward functional and medical goals  Care  Tool:  Bathing    Body parts bathed by patient: Face, Right arm, Left arm, Chest, Abdomen, Front perineal area, Right upper leg, Left upper leg, Right lower leg, Left lower leg   Body parts bathed by helper: Buttocks     Bathing assist Assist Level: Minimal Assistance - Patient > 75%     Upper Body Dressing/Undressing Upper body dressing   What is the patient wearing?: Hospital gown only    Upper body assist Assist Level: Minimal Assistance - Patient > 75%    Lower Body Dressing/Undressing Lower body dressing      What is the patient wearing?: Hospital gown only     Lower body assist Assist for lower body dressing: Minimal Assistance - Patient > 75%     Toileting Toileting    Toileting assist Assist for toileting: Moderate Assistance - Patient 50 - 74%     Transfers Chair/bed transfer  Transfers assist     Chair/bed transfer assist level: Minimal Assistance - Patient > 75% Chair/bed transfer assistive device: Walker, Clinical biochemist   Ambulation assist      Assist level: Minimal Assistance - Patient > 75% Assistive device: Walker-rolling Max distance: 29ft   Walk 10 feet activity   Assist     Assist level: Minimal Assistance - Patient > 75% Assistive device: [employer]  50 feet activity   Assist Walk 50 feet with 2 turns activity did not occur: Refused         Walk 150 feet activity   Assist Walk 150 feet activity did not occur: Safety/medical concerns         Walk 10 feet on uneven surface  activity   Assist Walk 10 feet on uneven surfaces activity did not occur: Safety/medical concerns         Wheelchair     Assist Is the patient using a wheelchair?: No Type of Wheelchair: Manual    Wheelchair assist level: Minimal Assistance - Patient > 75% Max wheelchair distance: 150    Wheelchair 50 feet with 2 turns activity    Assist        Assist Level: Minimal Assistance - Patient >  75%   Wheelchair 150 feet activity     Assist      Assist Level: Minimal Assistance - Patient > 75%   Blood pressure 130/67, pulse 69, temperature 98.6 F (37 C), temperature source Oral, resp. rate 16, height 5\' 10"  (1.778 m), weight 91.1 kg, SpO2 95 %.  Medical Problem List and Plan: 1. 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  Antithrombotics: -DVT/anticoagulation:  Pharmaceutical: Other (comment) Eliquis             -antiplatelet therapy: N/A 3. Left knee pain: Recommended applying ice 15 minutes three times per day.Tylenol as needed  Abrasion healing well  4. Mood: Provide emotional support             -antipsychotic agents: N/A 5. Neuropsych: This patient is capable of making decisions on her own behalf. 6. Skin/Wound Care: Routine skin checks 7. Fluids/Electrolytes/Nutrition: Routine in and outs with follow-up chemistries 8.  Hyperlipidemia.  Continue Lipitor 9.  not on antihypertensive meds at home , will cont to monitor , normotensive today  Vitals:   07/05/21 1827 07/06/21 0426  BP: 129/61 130/67  Pulse: 73 69  Resp: 18 16  Temp: 98.6 F (37 C) 98.6 F (37 C)  SpO2: 95% 95%  2/13 controlled off meds  10.  History of breast cancer.  Continue Femara 11.  COPD/asthma.  Monitor oxygen saturations every shift.  Continue nebulizer as needed.  Follow-up Dr. Annamaria Boots 12.  Atrial fibrillation.  Cardiac rate controlled.  Continue Eliquis.  Follow-up Dr. Marlou Porch- rate controlled no BB 13.  Diastolic congestive heart failure.  No signs of fluid overload 14. Overweight: BMI 28.82- provide dietary education    LOS: 5 days A FACE TO FACE EVALUATION WAS  PERFORMED  Charlett Blake 07/06/2021, 7:51 AM

## 2021-07-06 NOTE — Progress Notes (Signed)
Occupational Therapy Session Note  Patient Details  Name: Laura Mendez MRN: [id number] Date of Birth: 02-25-1941  Today's Date: 07/06/2021 OT Individual Time: 6484-7207 OT Individual Time Calculation (min): 56 min    Short Term Goals: Week 1:  OT Short Term Goal 1 (Week 1): Pt will complete toilet transfer with min A and LRAD. OT Short Term Goal 2 (Week 1): Pt will complete standing grooming task with min A. OT Short Term Goal 3 (Week 1): Pt will require no more than min cuing for LUE positioning.  Skilled Therapeutic Interventions/Progress Updates:    Pt greeted semi-reclined in bed and agreeable to OT treatment session. Pt reported she had already washed off a little and declined further need for bathing. Pt agreeable to get dressing. Pt completed bed mobility with HOB elevated and supervision. Pt got dressed at EOB with min A for LB dressing. OT educated on friction reducing bag to assist with donning TED hose. Pt then stood and pivoted to wc with RW and min A with verbal cues for hand placement. Pt brought to therapy gym and completed 6 mins on [employer] arm bike for UB there-ex. Functional ambulation with RW and min A for obstacle course. Pt needed cues for proximity to RW during ambulation. Pt needed multiple rest breaks after obstacle course. Pt returned to room and left seated in wc awaiting nurse tech to go to the bathroom.   Therapy Documentation Precautions:  Precautions Precautions: Fall Precaution Comments: mild L hemi, wound on L knee Restrictions Weight Bearing Restrictions: No Pain:  Denies pain   Therapy/Group: Individual Therapy  Valma Cava 07/06/2021, 9:24 AM

## 2021-07-06 NOTE — Progress Notes (Signed)
Occupational Therapy Session Note  Patient Details  Name: Laura Mendez MRN: [id number] Date of Birth: 1941/04/20  Today's Date: 07/06/2021 OT Individual Time: 1020-1050 OT Individual Time Calculation (min): 30 min    Short Term Goals: Week 1:  OT Short Term Goal 1 (Week 1): Pt will complete toilet transfer with min A and LRAD. OT Short Term Goal 2 (Week 1): Pt will complete standing grooming task with min A. OT Short Term Goal 3 (Week 1): Pt will require no more than min cuing for LUE positioning.  Skilled Therapeutic Interventions/Progress Updates:  Skilled OT intervention completed with focus on fine motor and NMR of LUE, cognitive strategies, standing tolerance. Pt received seated in recliner, agreeable to session. Pt reported fatigue from previous session, with request to complete in room therapy. Pt participated in table top activity while in stance with RW and CGA. Pt able to stand with min A for powering up using RW, then maintain standing to promote endurance needed for self-care tasks. Pt did require 1 episode of min A to correct balance as pt shifted weight into L leg, and her knee slightly buckled and had LOB, but pt and therapist able to correct with min A. At table top, pt was able to use L hand to transfer/flip game card over for NMR, and place them into matching piles for cognitive matching strategies. Pt with min difficulty with finger translation skill due to decreased fine motor precision during separating cards stuck together. Pt was left seated in w/c, with chair alarm on and all needs in reach at end of session.   Therapy Documentation Precautions:  Precautions Precautions: Fall Precaution Comments: mild L hemi, wound on L knee Restrictions Weight Bearing Restrictions: No  Pain: No c/o pain   Therapy/Group: Individual Therapy  Masaki Rothbauer E Brysun Eschmann 07/06/2021, 7:26 AM

## 2021-07-07 ENCOUNTER — Other Ambulatory Visit: Payer: Self-pay | Admitting: Internal Medicine

## 2021-07-07 MED ORDER — FLUTICASONE PROPIONATE 50 MCG/ACT NA SUSP
1.0000 | Freq: Every day | NASAL | Status: DC
  Filled 2021-07-07: qty 16

## 2021-07-07 NOTE — Progress Notes (Signed)
Physical Therapy Session Note  Patient Details  Name: Laura Mendez MRN: [id number] Date of Birth: [birth year]  Today's Date: 07/06/2021 PT Individual Time:  1301-1359  PT Individual Time Calculation (min): 58 min    Short Term Goals: Week 1:  PT Short Term Goal 1 (Week 1): pt will perform bed mobility with supeivsion assist PT Short Term Goal 2 (Week 1): Pt will consistenly perform stand pivot trasnfers with RW nad CGA PT Short Term Goal 3 (Week 1): Pt will ambulate 40ft with CGA and LRAD PT Short Term Goal 4 (Week 1): Pt will ascend 4 steps with min assist and 1 rail  Skilled Therapeutic Interventions/Progress Updates:  Patient seated upright in w/c on entrance to room. Patient alert and agreeable to PT session. Although initially hesitant to participate d/t recent pain in L knee.   Patient with pain complaint tof brief shooting pains behind L patella when weight bearing just prior to PT's arrival. "I don't believe my knee will hold me today."  Therapeutic Activity: Transfers: Patient performed sit<>stand and stand pivot transfers throughout session with Min/ ModA for power up. Pt prefers to count to three with two attempts prior to final push to stand. NMR provided  during session for improved performance. Provided verbal cues for technique and positioning.  Toilet transfer performed with Min/ ModA for power up from seat. Pt requires MinA for clothing mgmt off/ on. Manages pericare with SUP.   Gait Training:  Patient ambulated 44' x1/ 45' x1 using RW with CGA. Close w/c follow for pain/ fatigue. Demonstrated slow hesitant pace leading with LLE to guard for pain in knee. Provided vc/ tc for increasing UE support initially and slowly increasing weight to LLE in order to test knee stability. Pt with no pain throughout gait and no report of instability.   Neuromuscular Re-ed: NMR facilitated during session with focus on standing balance, sit<>stand training and weight shifting. Pt  guided in attempt to decrease need for preparatory attempts to stand prior to final push with effort. Guided pt in increased need for forward lean over feet with assisted push from hands at armrests to increase lean without physical assist. Pt continues to demo light fear in forward lean requiring at least MinA for power up.  NMR performed for improvements in motor control and coordination, balance, sequencing, judgement, and self confidence/ efficacy in performing all aspects of mobility at highest level of independence.   Patient seated upright  in w/c at end of session with brakes locked, belt alarm set, and all needs within reach.     Therapy Documentation Precautions:  Precautions Precautions: Fall Precaution Comments: mild L hemi, wound on L knee Restrictions Weight Bearing Restrictions: No General:    Pain:  Pt relates instance of pain posterior to L patella prior to PT's arrival but no sharp pains related during session.   Therapy/Group: Individual Therapy  Alger Simons PT, DPT 07/06/2021, 5:12 PM

## 2021-07-07 NOTE — Progress Notes (Signed)
Speech Language Pathology Daily Session Note  Patient Details  Name: Laura Mendez MRN: [id number] Date of Birth: [birth year]  Today's Date: 07/07/2021 SLP Individual Time: 1100-1200 SLP Individual Time Calculation (min): 60 min  Short Term Goals: Week 1: SLP Short Term Goal 1 (Week 1): Patient will recall 2-3 speech intelligibility strategies to increase intelligibility when fatigued with minA verbal cues. SLP Short Term Goal 2 (Week 1): Patient will recall and demonstrate use of memory compensations to increase independence and safety at home with minA verbal cues. SLP Short Term Goal 3 (Week 1): Patient will identify 2-3 areas of impairment to increase awareness of deficits with minA verbal cues.  Skilled Therapeutic Interventions: Skilled ST treatment focused on cognitive goals. SLP facilitated session by providing overall supervision level assist for problem solving and reasoning with hypothetical scenarios pertaining to home medication management system. Patient completed medication label comprehension with 100% accuracy at independent level. Educated patient on compensatory memory strategies to aid recall for taking BID medications such as using alarm on phone or written reminders. Pt was known to ask the same questions throughout session with minimal awareness, as well as decreased recall of medication names/purposes following 5 minute delay. Patient was left in wheelchair with alarm activated and immediate needs within reach at end of session. Continue per current plan of care.      Pain Pain Assessment Pain Scale: 0-10 Pain Score: 0-No pain  Therapy/Group: Individual Therapy  Patty Sermons 07/07/2021, 12:34 PM

## 2021-07-07 NOTE — Progress Notes (Signed)
Slept well last night. Bathroom privileges as needed. Denies any pain or SOB. VS stable. Safety maintained at all times.

## 2021-07-07 NOTE — Plan of Care (Signed)
°  Problem: Consults Goal: RH STROKE PATIENT EDUCATION Description: See Patient Education module for education specifics  Outcome: Progressing   Problem: RH SAFETY Goal: RH STG ADHERE TO SAFETY PRECAUTIONS W/ASSISTANCE/DEVICE Description: STG Adhere to Safety Precautions With cues Assistance/Device. Outcome: Progressing   Problem: RH KNOWLEDGE DEFICIT Goal: RH STG INCREASE KNOWLEGDE OF HYPERLIPIDEMIA Description: Patient will be able to manage HLD with medications and dietary modifications using handouts and educational resources independently Outcome: Progressing Goal: RH STG INCREASE KNOWLEDGE OF STROKE PROPHYLAXIS Description: Patient will be able to manage secondary stroke risks with medications and dietary modifications using handouts and educational resources independently Outcome: Progressing

## 2021-07-07 NOTE — Progress Notes (Signed)
Neb treatment given for wheezing noted on assessment and as per orders.

## 2021-07-07 NOTE — Progress Notes (Signed)
Occupational Therapy Session Note  Patient Details  Name: Laura Mendez MRN: [id number] Date of Birth: 1941/01/07  Today's Date: 07/07/2021 OT Individual Time: 7026-3785 OT Individual Time Calculation (min): 58 min +29 min   Short Term Goals: Week 1:  OT Short Term Goal 1 (Week 1): Pt will complete toilet transfer with min A and LRAD. OT Short Term Goal 2 (Week 1): Pt will complete standing grooming task with min A. OT Short Term Goal 3 (Week 1): Pt will require no more than min cuing for LUE positioning.  Skilled Therapeutic Interventions/Progress Updates:     Session 1 [id number]): Pt received semi-reclined in bed finishing breakfast, denies pain, agreeable to therapy. Session focus on self-care retraining, activity tolerance, transfer retraining in prep for improved ADL/IADL/func mobility performance + decreased caregiver burden. Covered L knee bandage with plastic wrap to water proof in prep for shower. Came to sitting EOB with close S and use of bed rails.   Ambulated > toilet > TTB with CGA and use of RW, able to recall split hand technique independently. Close S for toileting tasks post continent void of bladder. Bathed full body while seated with close S and able to reach periarea via lateral leans. Stood from elevated height TTB with CGA and use of RUE on grab bar. Set-up A seated EOB for grooming tasks and UB dressing. Min A to thread BLE into pants due to them being caught on gripper socks, able to doff B socks with S, total A to don B teds hose. Donned B shoes max A, and able to tie R shoe with S and mod A to tie L via figure 4 position. MD in/out morning assessment and son present at end of session. Stand-pivot > w/c with CGA + RW. Able to blow dry and comb hair with S and use of BUE.  Pt left seated in w/c with son present, safety belt alarm engaged, call bell in reach, and all immediate needs met.    Session 2 [phone]): Pt received seated in w/c with son present,  reports ongoing L knee pain - LPN notified for request for rx, agreeable to therapy. Session focus on self-care retraining, activity tolerance, transfer retraining in prep for improved ADL/IADL/func mobility performance + decreased caregiver burden. Ambulatory toilet transfer with RW and CGA, completed toileting tasks with close S post continent void of bladder. Completed hand hygiene at sink close S.   Ambulated to and from ADL apartment with CGA, cues to lighten BUE pressure on RW and for upright posture. Pt completed sit to stand from low heigh sofa with heavy mod A to power up and facilitate trunk / hip extension.   Pt left seated in w/c with safety belt placed, call bell in reach, and all immediate needs met.    Therapy Documentation Precautions:  Precautions Precautions: Fall Precaution Comments: mild L hemi, wound on L knee Restrictions Weight Bearing Restrictions: No  Pain: see session note   ADL: See Care Tool for more details.   Therapy/Group: Individual Therapy  Volanda Napoleon MS, OTR/L  07/07/2021, 6:52 AM

## 2021-07-07 NOTE — Progress Notes (Signed)
Physical Therapy Session Note  Patient Details  Name: Laura Mendez MRN: [id number] Date of Birth: 09-18-1940  Today's Date: 07/07/2021 PT Individual Time: 0901-0959 PT Individual Time Calculation (min): 58 min   Short Term Goals: Week 1:  PT Short Term Goal 1 (Week 1): pt will perform bed mobility with supeivsion assist PT Short Term Goal 2 (Week 1): Pt will consistenly perform stand pivot trasnfers with RW nad CGA PT Short Term Goal 3 (Week 1): Pt will ambulate 33ft with CGA and LRAD PT Short Term Goal 4 (Week 1): Pt will ascend 4 steps with min assist and 1 rail  Skilled Therapeutic Interventions/Progress Updates:  Patient seated upright in w/c on entrance to room. Patient alert and agreeable to PT session. Son present in room.   Patient with no pain complaint throughout session. Pt relates using "[place] Dream" cream on her knee that has helped with her pain.   Therapeutic Activity: Transfers: Patient performed sit<>stand and stand pivot transfers throughout session with up to [place] for powerup  and assist into forward lean with pt continuing to require multiple attempts with true effort on second or third "count to three". VC/ tc throughout.   Toilet transfer performed with CGA for power up from seat using safety rail to pull. SUP for donning pants. SUP for pericare.   Gait Training:  Patient ambulated 38' x1/ 35' x1 using RW with CGA. Demonstrated improving step length with LLE. VC for upright posture and level gaze.   Pt guided in stair training. Despite only 2 steps and one threshold step to enter home, pt willing to complete 4 steps. Reviewed the technique of leading with stronger LE to ascend and affected LE to descend. Using BHR, pt is able to complete 4 steps with CGA/ MinA for safety with constant vc/ tc for proper technique, hand progression and assist.. min guard to L knee on descent.   Pt also guided in use of RW to ascend/ descend 5" curb step. Requires  consistent vc and completes with CGA.   Neuromuscular Re-ed: NMR facilitated during session with focus on transfer training technique. Pt guided in NDT training and cueing for improving forward lean. Pt is able to improve during session but minimal carryover noted following focused training. NMR performed for improvements in motor control and coordination, balance, sequencing, judgement, and self confidence/ efficacy in performing all aspects of mobility at highest level of independence.   Patient seated upright  in w/c at end of session with brakes locked, belt alarm set, and all needs within reach.  Therapy Documentation Precautions:  Precautions Precautions: Fall Precaution Comments: mild L hemi, wound on L knee Restrictions Weight Bearing Restrictions: No General:   Vital Signs: Therapy Vitals Temp: 98 F (36.7 C) Pulse Rate: 64 Resp: 15 BP: 138/70 Patient Position (if appropriate): Lying Oxygen Therapy SpO2: 95 % Pain:  No pain complaint this session.  Therapy/Group: Individual Therapy  Alger Simons PT, DPT 07/07/2021, 7:53 AM

## 2021-07-07 NOTE — Progress Notes (Signed)
PROGRESS NOTE   Subjective/Complaints:  Runny nose, some wheezing , used her home inhaler this am   ROS- Pt denies SOB, abd pain, CP, N/V/C/D,   Objective:   No results found. No results for input(s): WBC, HGB, HCT, PLT in the last 72 hours. No results for input(s): NA, K, CL, CO2, GLUCOSE, BUN, CREATININE, CALCIUM in the last 72 hours.  Intake/Output Summary (Last 24 hours) at 07/07/2021 0731 Last data filed at 07/06/2021 1821 Gross per 24 hour  Intake 358 ml  Output --  Net 358 ml         Physical Exam: Vital Signs Blood pressure 138/70, pulse 64, temperature 98 F (36.7 C), resp. rate 15, height 5\' 10"  (1.778 m), weight 91.1 kg, SpO2 95 %.  General: No acute distress Mood and affect are appropriate Heart: Regular rate and rhythm no rubs murmurs or extra sounds Lungs: Clear to auscultation, breathing unlabored, no rales , mild bilateral  wheezes Abdomen: Positive bowel sounds, soft nontender to palpation, nondistended  Extremities: No clubbing, cyanosis, or edema Skin: Left pre patellar abrasion , no active leaking cont Xeroform + foam dressing -covered for shower Neurologic: Cranial nerves II through XII intact, motor strength is 5/5 in RIght and 4/5 Left deltoid, bicep, tricep, grip, hip flexor, knee extensors, ankle dorsiflexor and plantar flexor Sensory exam normal sensation to light touch and  in bilateral upper and lower extremities  Musculoskeletal: Reduced ROM due to pain LLE     Assessment/Plan: 1. Functional deficits which require 3+ hours per day of interdisciplinary therapy in a comprehensive inpatient rehab setting. Physiatrist is providing close team supervision and 24 hour management of active medical problems listed below. Physiatrist and rehab team continue to assess barriers to discharge/monitor patient progress toward functional and medical goals  Care Tool:  Bathing    Body parts  bathed by patient: Face, Right arm, Left arm, Chest, Abdomen, Front perineal area, Right upper leg, Left upper leg, Right lower leg, Left lower leg   Body parts bathed by helper: Buttocks     Bathing assist Assist Level: Minimal Assistance - Patient > 75%     Upper Body Dressing/Undressing Upper body dressing   What is the patient wearing?: Hospital gown only    Upper body assist Assist Level: Minimal Assistance - Patient > 75%    Lower Body Dressing/Undressing Lower body dressing      What is the patient wearing?: Hospital gown only     Lower body assist Assist for lower body dressing: Minimal Assistance - Patient > 75%     Toileting Toileting    Toileting assist Assist for toileting: Moderate Assistance - Patient 50 - 74%     Transfers Chair/bed transfer  Transfers assist     Chair/bed transfer assist level: Minimal Assistance - Patient > 75% Chair/bed transfer assistive device: Walker, Clinical biochemist   Ambulation assist      Assist level: Minimal Assistance - Patient > 75% Assistive device: Walker-rolling Max distance: 70ft   Walk 10 feet activity   Assist     Assist level: Minimal Assistance - Patient > 75% Assistive device: Walker-rolling   Walk 50 feet  activity   Assist Walk 50 feet with 2 turns activity did not occur: Refused         Walk 150 feet activity   Assist Walk 150 feet activity did not occur: Safety/medical concerns         Walk 10 feet on uneven surface  activity   Assist Walk 10 feet on uneven surfaces activity did not occur: Safety/medical concerns         Wheelchair     Assist Is the patient using a wheelchair?: No Type of Wheelchair: Manual    Wheelchair assist level: Minimal Assistance - Patient > 75% Max wheelchair distance: 150    Wheelchair 50 feet with 2 turns activity    Assist        Assist Level: Minimal Assistance - Patient > 75%   Wheelchair 150 feet  activity     Assist      Assist Level: Minimal Assistance - Patient > 75%   Blood pressure 138/70, pulse 64, temperature 98 F (36.7 C), resp. rate 15, height 5\' 10"  (1.778 m), weight 91.1 kg, SpO2 95 %.  Medical Problem List and Plan: 1. 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  Antithrombotics: -DVT/anticoagulation:  Pharmaceutical: Other (comment) Eliquis             -antiplatelet therapy: N/A 3. Left knee pain: Recommended applying ice 15 minutes three times per day.Tylenol as needed  Abrasion healing well  4. Mood: Provide emotional support             -antipsychotic agents: N/A 5. Neuropsych: This patient is capable of making decisions on her own behalf. 6. Skin/Wound Care: Routine skin checks 7. Fluids/Electrolytes/Nutrition: Routine in and outs with follow-up chemistries 8.  Hyperlipidemia.  Continue Lipitor 9.  not on antihypertensive meds at home , will cont to monitor , normotensive today  Vitals:   07/06/21 1928 07/07/21 0551  BP: (!) 153/63 138/70  Pulse: 67 64  Resp: 18 15  Temp: 97.6 F (36.4 C) 98 F (36.7 C)  SpO2: 96% 95%  2/14 controlled off meds  10.  History of breast cancer.  Continue Femara 11.  COPD/asthma.  Monitor oxygen saturations every shift.  Continue nebulizer as needed.  Follow-up Dr. Annamaria Boots, add flonase for rhinitis  12.  Atrial fibrillation.  Cardiac rate controlled.  Continue Eliquis.  Follow-up Dr. Marlou Porch- rate controlled no BB 13.  Diastolic congestive heart failure.  No signs of fluid overload 14. Overweight: BMI 28.82- provide dietary education    LOS: 6 days A FACE TO FACE EVALUATION WAS PERFORMED  Laura Mendez 07/07/2021, 7:31 AM

## 2021-07-08 ENCOUNTER — Other Ambulatory Visit: Payer: Self-pay

## 2021-07-08 ENCOUNTER — Telehealth: Payer: Self-pay | Admitting: Internal Medicine

## 2021-07-08 ENCOUNTER — Inpatient Hospital Stay (HOSPITAL_COMMUNITY): Payer: Medicare Other

## 2021-07-08 DIAGNOSIS — J449 Chronic obstructive pulmonary disease, unspecified: Secondary | ICD-10-CM

## 2021-07-08 IMAGING — US US EXTREM LOW*L* COMPLETE
1 series · 14 of 23 positions shown · non-contrast
Comparison: [DATE]

CLINICAL DATA: Left knee pain, fell 10 days ago

EXAM:
LEFT LOWER EXTREMITY SOFT TISSUE ULTRASOUND COMPLETE
TECHNIQUE: Ultrasound examination was performed including evaluation of the
muscles, tendons, joint, and adjacent soft tissues.

[Series 1: us extrem low*left* complete · 0.06mm/px · 14 of 23 slices shown]
[im 1/23]
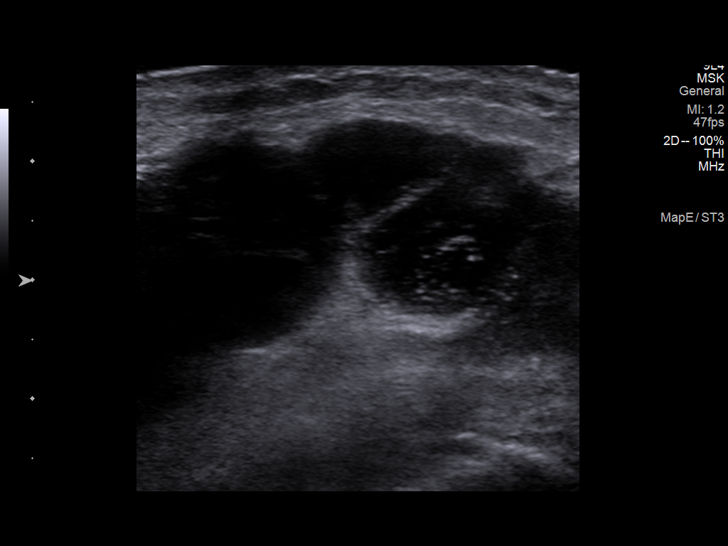
[im 3/23]
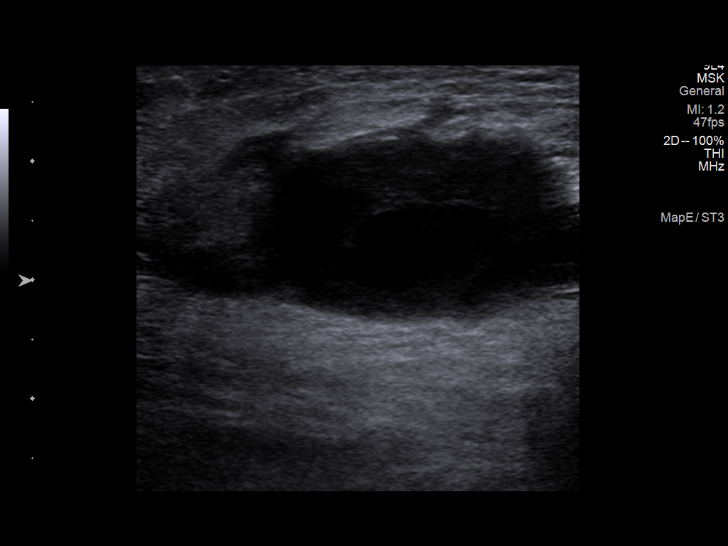
[im 5/23]
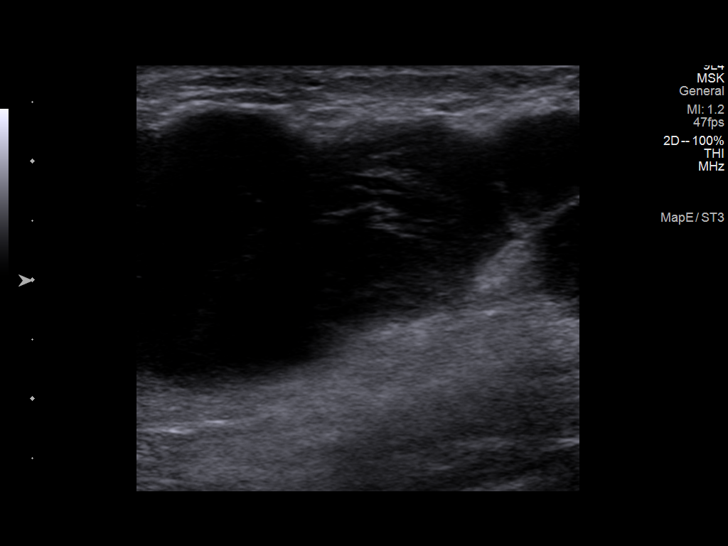
[im 6/23]
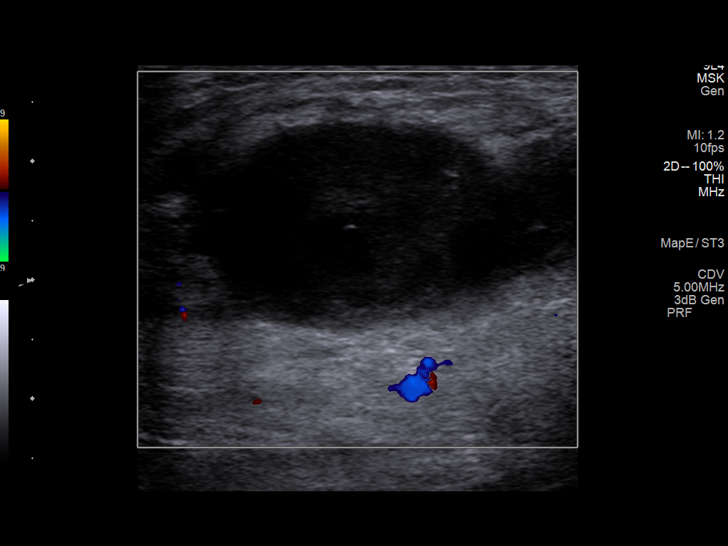
[im 8/23]
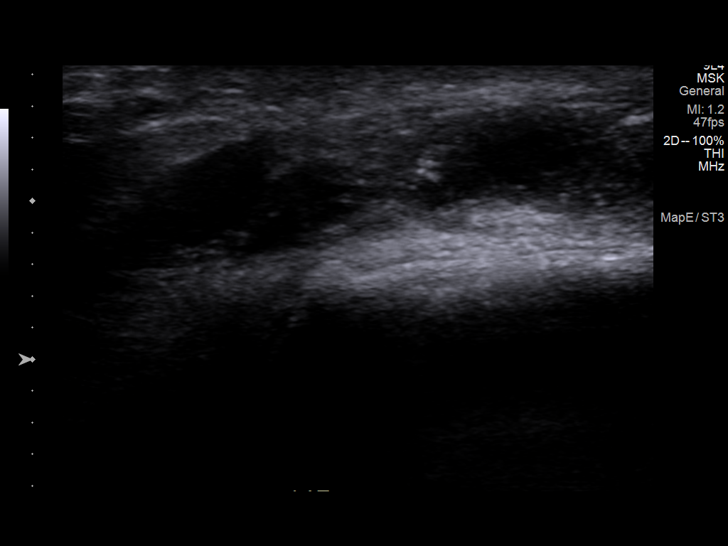
[im 10/23]
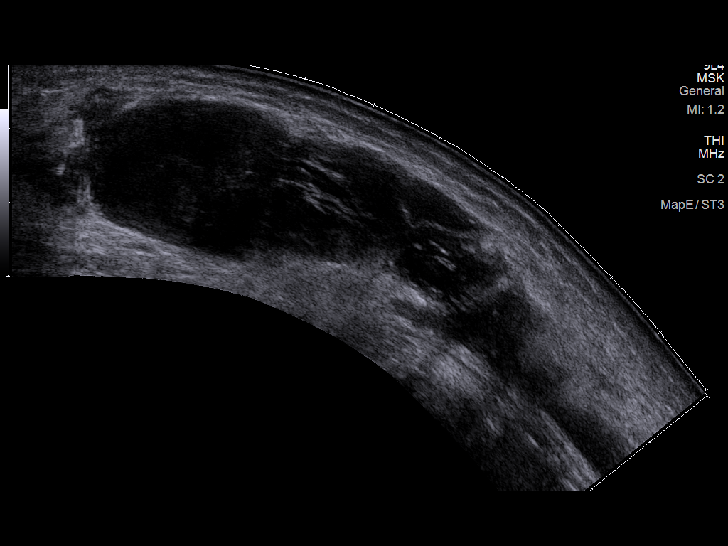
[im 11/23]
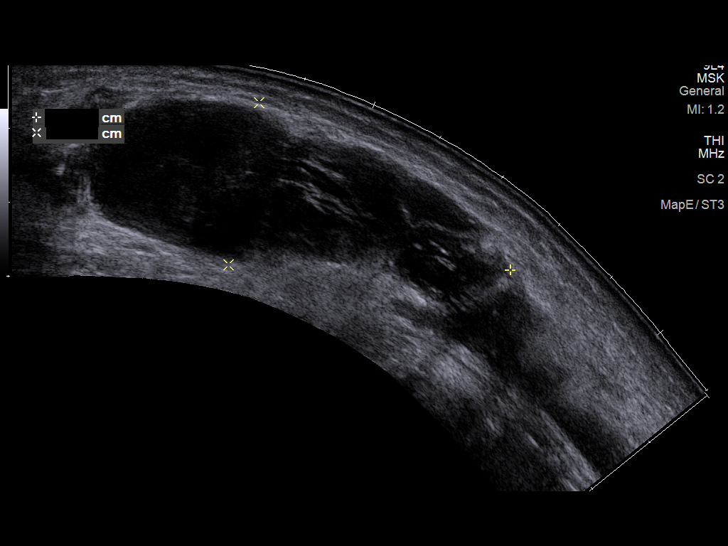
[im 13/23]
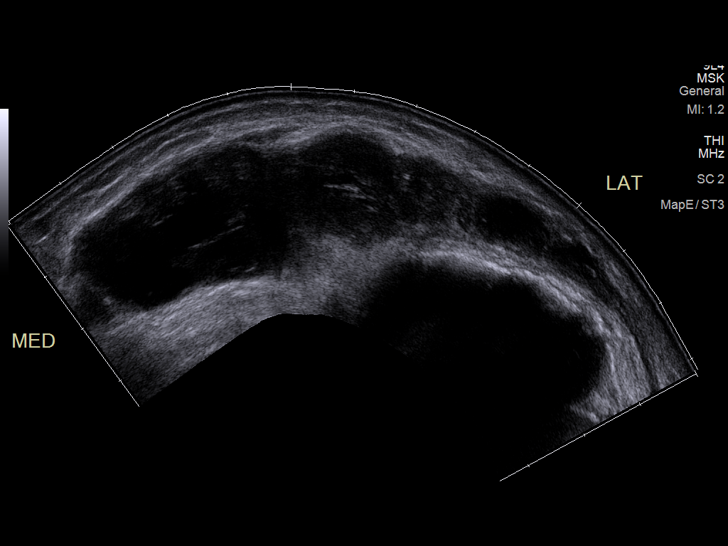
[im 14/23]
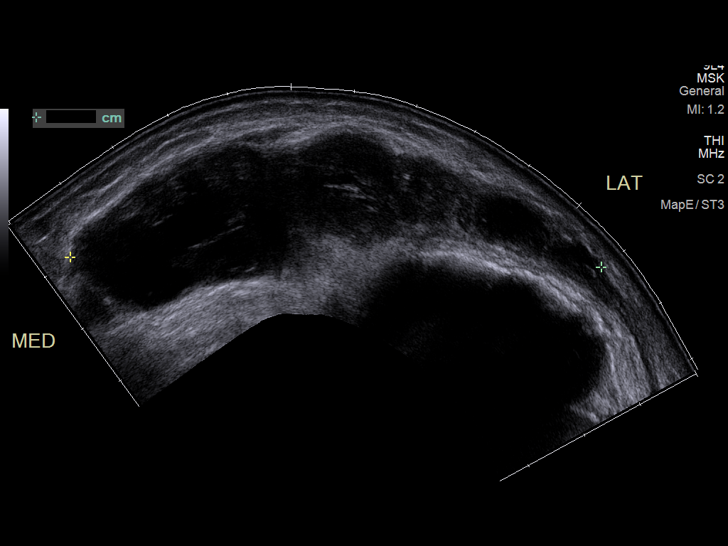
[im 16/23]
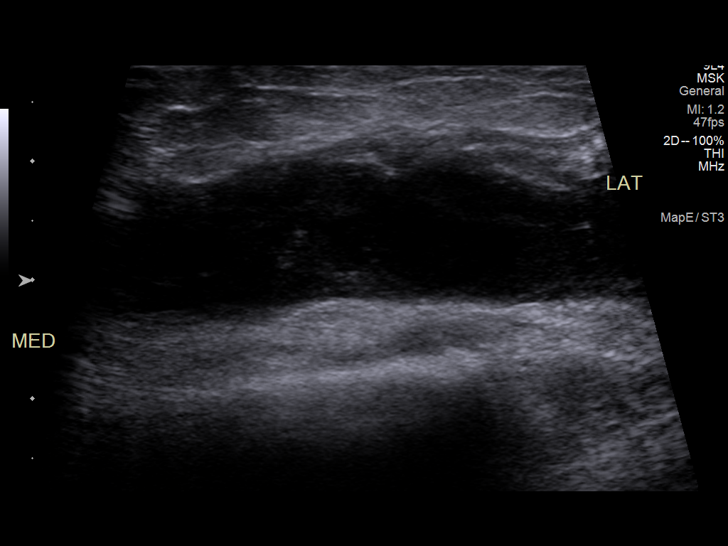
[im 18/23]
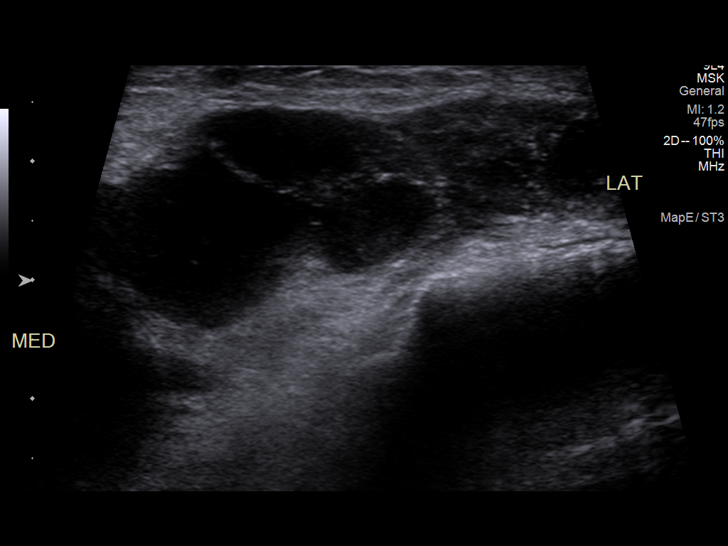
[im 19/23]
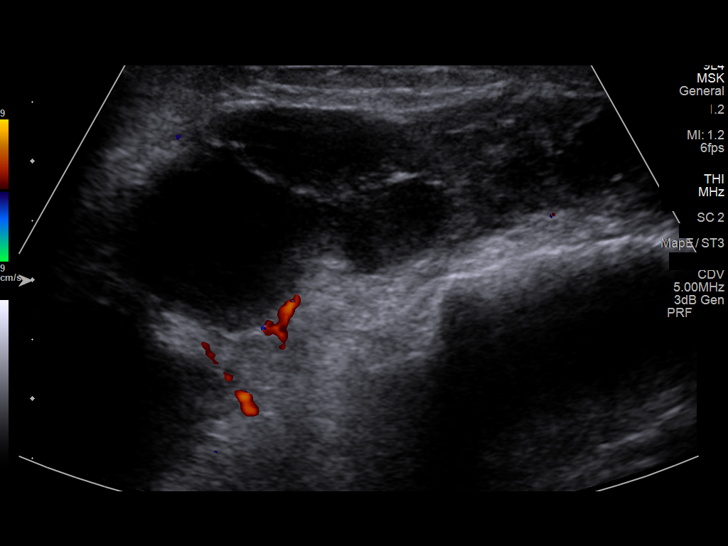
[im 21/23]
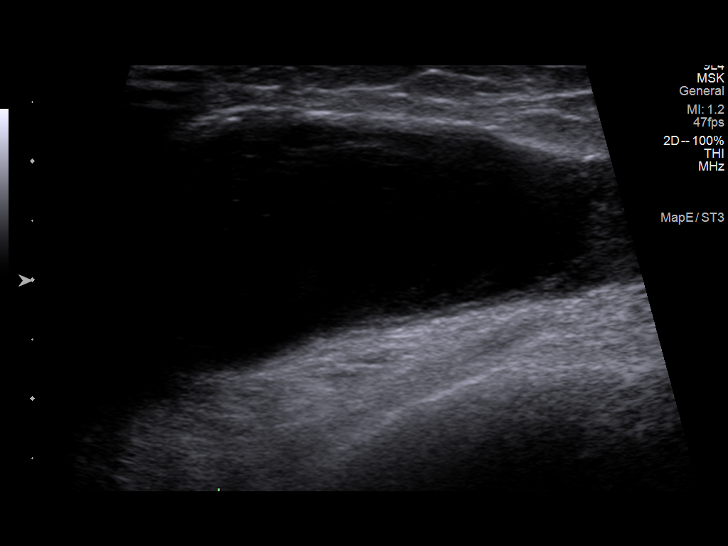
[im 23/23]
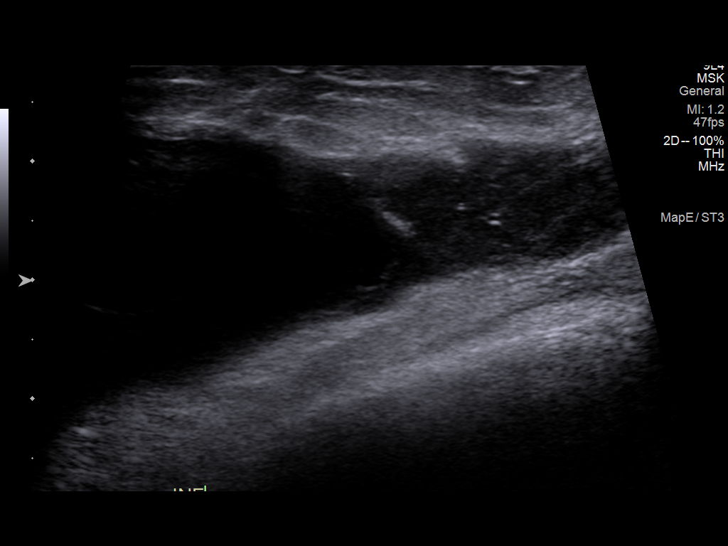

[14 of 23 positions shown; findings below may reference images not displayed]

FINDINGS: Sonographic evaluation of the palpable area anterior to the left
patella was performed, in the region of soft tissue thickening on
recent CT. In the subcutaneous tissues there is an 8.3 by 6.2 x
cm complex hypoechoic fluid collection, with numerous internal
septations. Given history of recent trauma, this is most consistent
with subcutaneous hematoma. No internal vascularity.
IMPRESSION: 1. Complex subcutaneous fluid collection overlying the left patella,
most consistent with subcutaneous hematoma given history of recent
trauma. This corresponds to previous CT findings.

## 2021-07-08 MED ORDER — ALBUTEROL SULFATE HFA 108 (90 BASE) MCG/ACT IN AERS: INHALATION_SPRAY | RESPIRATORY_TRACT | 5 refills | Status: DC

## 2021-07-08 MED ORDER — CAMPHOR-MENTHOL 0.5-0.5 % EX LOTN
TOPICAL_LOTION | CUTANEOUS | Status: DC | PRN
  Filled 2021-07-08: qty 222

## 2021-07-08 NOTE — Progress Notes (Signed)
Speech Language Pathology Daily Session Note  Patient Details  Name: Laura Mendez MRN: [id number] Date of Birth: [birth year]  Today's Date: 07/08/2021 SLP Individual Time: 1102-1117 SLP Individual Time Calculation (min): 15 min and Today's Date: 07/08/2021 SLP Missed Time: 15 Minutes Missed Time Reason: Other (Comment) (Ultrasound)  Short Term Goals: Week 1: SLP Short Term Goal 1 (Week 1): Patient will recall 2-3 speech intelligibility strategies to increase intelligibility when fatigued with minA verbal cues. SLP Short Term Goal 2 (Week 1): Patient will recall and demonstrate use of memory compensations to increase independence and safety at home with minA verbal cues. SLP Short Term Goal 3 (Week 1): Patient will identify 2-3 areas of impairment to increase awareness of deficits with minA verbal cues.  Skilled Therapeutic Interventions: Skilled treatment session focused on cognitive goals. Upon arrival, patient was awake in the wheelchair and agreeable to ST session. Patient reported fatigue due to extensive therapy schedule. Patient reporting intermittent discomfort in knee and asked what "tests" the physician ordered today. At that time, an ultrasound technician arrived. Patient transferred back to bed with extra time and Min A. Patient missed remaining 15 minutes of session due to testing. Patient left upright in bed with all needs within reach and tech present. Continue with current plan of care.      Pain Discomfort in left knee   Therapy/Group: Individual Therapy  Pheng Prokop, [place] 07/08/2021, 3:13 PM

## 2021-07-08 NOTE — Progress Notes (Signed)
PROGRESS NOTE   Subjective/Complaints:  Did better with nebulizer than with inhaler for wheezing Left knee remains swollen  ROS- Pt denies SOB, abd pain, CP, N/V/C/D,   Objective:   No results found. No results for input(s): WBC, HGB, HCT, PLT in the last 72 hours. No results for input(s): NA, K, CL, CO2, GLUCOSE, BUN, CREATININE, CALCIUM in the last 72 hours.  Intake/Output Summary (Last 24 hours) at 07/08/2021 0824 Last data filed at 07/07/2021 2309 Gross per 24 hour  Intake 480 ml  Output --  Net 480 ml         Physical Exam: Vital Signs Blood pressure 119/80, pulse 66, temperature (!) 97.5 F (36.4 C), temperature source Oral, resp. rate 18, height 5\' 10"  (1.778 m), weight 91.1 kg, SpO2 98 %.  General: No acute distress Mood and affect are appropriate Heart: Regular rate and rhythm no rubs murmurs or extra sounds Lungs: Clear to auscultation, breathing unlabored, no rales , mild bilateral  wheezes Abdomen: Positive bowel sounds, soft nontender to palpation, nondistended  Extremities: No clubbing, cyanosis, or edema Skin: Left pre patellar abrasion , no active leaking cont Xeroform + foam dressing -covered for shower Neurologic: Cranial nerves II through XII intact, motor strength is 5/5 in RIght and 4/5 Left deltoid, bicep, tricep, grip, hip flexor, knee extensors, ankle dorsiflexor and plantar flexor Sensory exam normal sensation to light touch and  in bilateral upper and lower extremities  Musculoskeletal: Reduced ROM due to pain LLE , pre patellar swelling and ecchymosis, no sig joint effusion , mild tenderness    Assessment/Plan: 1. Functional deficits which require 3+ hours per day of interdisciplinary therapy in a comprehensive inpatient rehab setting. Physiatrist is providing close team supervision and 24 hour management of active medical problems listed below. Physiatrist and rehab team continue to  assess barriers to discharge/monitor patient progress toward functional and medical goals  Care Tool:  Bathing    Body parts bathed by patient: Face, Right arm, Left arm, Chest, Abdomen, Front perineal area, Right upper leg, Left upper leg, Right lower leg, Left lower leg   Body parts bathed by helper: Buttocks     Bathing assist Assist Level: Minimal Assistance - Patient > 75%     Upper Body Dressing/Undressing Upper body dressing   What is the patient wearing?: Hospital gown only    Upper body assist Assist Level: Minimal Assistance - Patient > 75%    Lower Body Dressing/Undressing Lower body dressing      What is the patient wearing?: Hospital gown only     Lower body assist Assist for lower body dressing: Minimal Assistance - Patient > 75%     Toileting Toileting    Toileting assist Assist for toileting: Moderate Assistance - Patient 50 - 74%     Transfers Chair/bed transfer  Transfers assist     Chair/bed transfer assist level: Minimal Assistance - Patient > 75% Chair/bed transfer assistive device: Walker, Clinical biochemist   Ambulation assist      Assist level: Minimal Assistance - Patient > 75% Assistive device: Walker-rolling Max distance: 71ft   Walk 10 feet activity   Assist  Assist level: Minimal Assistance - Patient > 75% Assistive device: Walker-rolling   Walk 50 feet activity   Assist Walk 50 feet with 2 turns activity did not occur: Refused         Walk 150 feet activity   Assist Walk 150 feet activity did not occur: Safety/medical concerns         Walk 10 feet on uneven surface  activity   Assist Walk 10 feet on uneven surfaces activity did not occur: Safety/medical concerns         Wheelchair     Assist Is the patient using a wheelchair?: No Type of Wheelchair: Manual    Wheelchair assist level: Minimal Assistance - Patient > 75% Max wheelchair distance: 150    Wheelchair 50  feet with 2 turns activity    Assist        Assist Level: Minimal Assistance - Patient > 75%   Wheelchair 150 feet activity     Assist      Assist Level: Minimal Assistance - Patient > 75%   Blood pressure 119/80, pulse 66, temperature (!) 97.5 F (36.4 C), temperature source Oral, resp. rate 18, height 5\' 10"  (1.778 m), weight 91.1 kg, SpO2 98 %.  Medical Problem List and Plan: 1. 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  Antithrombotics: -DVT/anticoagulation:  Pharmaceutical: Other (comment) Eliquis             -antiplatelet therapy: N/A 3. Left knee pain: Recommended applying ice 15 minutes three times per day.Tylenol as needed  Abrasion healing well but has persistent prepatellar swelling ? Hemorrhagic vs effusion, check [place], may need ortho to eval 4. Mood: Provide emotional support             -antipsychotic agents: N/A 5. Neuropsych: This patient is capable of making decisions on her own behalf. 6. Skin/Wound Care: Routine skin checks 7. Fluids/Electrolytes/Nutrition: Routine in and outs with follow-up chemistries 8.  Hyperlipidemia.  Continue Lipitor 9.  not on antihypertensive meds at home , will cont to monitor , normotensive today  Vitals:   07/07/21 1944 07/08/21 0325  BP: 128/75 119/80  Pulse: 67 66  Resp: 16 18  Temp: 98 F (36.7 C) (!) 97.5 F (36.4 C)  SpO2: 96% 98%  2/15 controlled off meds  10.  History of breast cancer.  Continue Femara 11.  COPD/asthma.  Monitor oxygen saturations every shift.  Continue nebulizer as needed.  Follow-up Dr. Annamaria Boots, add flonase for rhinitis  12.  Atrial fibrillation.  Cardiac rate controlled.  Continue  Eliquis.  Follow-up Dr. Marlou Porch- rate controlled no BB 13.  Diastolic congestive heart failure.  No signs of fluid overload 14. Overweight: BMI 28.82- provide dietary education    LOS: 7 days A FACE TO FACE EVALUATION WAS PERFORMED  Charlett Blake 07/08/2021, 8:24 AM

## 2021-07-08 NOTE — Patient Care Conference (Addendum)
Inpatient RehabilitationTeam Conference and Plan of Care Update Date: 07/08/2021   Time: 10:20 AM    Patient Name: Laura Mendez      Medical Record Number: [id number]  Date of Birth: [birth year] Sex: Female         Room/Bed: 4M02C/4M02C-01 Payor Info: Payor: [employer] MEDICARE / Plan: [facility] MEDICARE / Product Type: *No Product type* /    Admit Date/Time:  07/01/2021  2:03 PM  Primary Diagnosis:  Embolic cerebral infarction [facility])  Hospital Problems: Principal Problem:   Embolic cerebral infarction [facility])    Expected Discharge Date: Expected Discharge Date: 07/15/21  Team Members Present: Physician leading conference: Dr. Alysia Penna Social Worker Present: Erlene Quan, BSW Nurse Present: Dorien Chihuahua, RN PT Present: Barrie Folk, PT OT Present: Providence Lanius, OT SLP Present: Sherren Kerns, SLP PPS Coordinator present : Gunnar Fusi, SLP     Current Status/Progress Goal Weekly Team Focus  Bowel/Bladder   pt is continent of b/b; LBM 2/12  Pt will remain continet  Q2h toileting/ PRN   Swallow/Nutrition/ Hydration             ADL's   min A LBD, CGA ambulatory bathroom transfers with RW, toileting tasks; set-up for seated UB ADL; primarily limited by LLE weakness/decreased activity tolerance; incorporating LUE into bimanual tasks with encouragement  S overall  L NMR, self-care/balance/transfer retraining, pt/family/AE/DME education   Mobility   bed mobility = MinA; Transfers = minA; Ambulation = MinA with RW, stairs = CGA/ MinA with BHR  Bed mobility with IND, Transfers with IND, Car transfer with SUP, Ambulation with SUP using LRAD; steps with CGA  LLE NMR, transfer training, gait training, standing balance, family education, stair negotiation, memory training   Communication   mod I  IND  speech inteligiblity strategies   Safety/Cognition/ Behavioral Observations  sup-to-min A for complex cognition and recall  sup A  compensatory memory  strategies, error awareness   Pain   pt c/o LLE pain, PRN tylenol effective  pt will be free of pain.  Assess pain shift qshift   Skin   pt presents with significant brusing r/t to the fall prior to admission. pt L knee, swollen, warm and tender to touch; edges of the warm and red areas outlined for assessment purposes.  Pt skin will remain intact and free of beakdown.  Assess skin qshift/prn.     Discharge Planning:  Discharging home with sons and DIL to assist. 24/7 supervision avaliable. Son plans to get stair lift   Team Discussion: Patient with knee contusion/patellar bursa edema; MD ordered US of knee (muscular skeletal r/o hematoma) Patient on target to meet rehab goals: yes, currently CGA for toileting, seated for bathing at the shower level. Supervision overall but CGA for transfers and gait , min assist to manage curbs with an antalgic gait pattern due to knee instability. Goals for discharge set for CGA for steps and supervision overall. Patient with high level cognitive impairments, working on awareness, recall and memory.  *See Care Plan and progress notes for long and short-term goals.   Revisions to Treatment Plan:  N/A   Teaching Needs: Safety, transfers, toileting, medications, secondary risk management, etc  Current Barriers to Discharge: Decreased caregiver support and Home enviroment access/layout  Possible Resolutions to Barriers: Family education Stair lift information given to son for patient's home Ramp for entry to son's home recommended [facility] follow up services     Medical Summary Current Status: left knee contusion with pre patellar swelling,  hx of COPD requiring nebulizers more frequently  Barriers to Discharge: Medical stability;Other (comments)  Barriers to Discharge Comments: Left knee contusion /pain     Continued Need for Acute Rehabilitation Level of Care: The patient requires daily medical management by a physician with specialized training in  physical medicine and rehabilitation for the following reasons: Direction of a multidisciplinary physical rehabilitation program to maximize functional independence : Yes Medical management of patient stability for increased activity during participation in an intensive rehabilitation regime.: Yes Analysis of laboratory values and/or radiology reports with any subsequent need for medication adjustment and/or medical intervention. : Yes   I attest that I was present, lead the team conference, and concur with the assessment and plan of the team.   Dorien Chihuahua B 07/08/2021, 2:54 PM

## 2021-07-08 NOTE — Progress Notes (Signed)
Physical Therapy Weekly Progress Note  Patient Details  Name: Laura Mendez MRN: [id number] Date of Birth: [birth year]  Beginning of progress report period: July 02, 2021 End of progress report period: July 08, 2021  Today's Date: 07/08/2021 PT Individual Time: 4562-5638 PT Individual Time Calculation (min): 54 min   Patient has met 4 of 4 short term goals.  Pt is making steady progress towards LTG of modified independent for transfers and supervision assist overall for gait with LRAD. Pt has progressed to intermittent supervision assist-CGA for transfers, gait, and stair management with use of UE support to reduce fall risk. [facility] education has not been initiated to this   Patient continues to demonstrate the following deficits muscle weakness and muscle joint tightness, decreased cardiorespiratoy endurance, decreased coordination, decreased attention, decreased awareness, decreased problem solving, decreased safety awareness, decreased memory, and delayed processing, and decreased standing balance, decreased postural control, hemiplegia, and decreased balance strategies and therefore will continue to benefit from skilled PT intervention to increase functional independence with mobility.  Patient progressing toward long term goals..  Continue plan of care.  PT Short Term Goals Week 1:  PT Short Term Goal 1 (Week 1): pt will perform bed mobility with supeivsion assist PT Short Term Goal 1 - Progress (Week 1): Met PT Short Term Goal 2 (Week 1): Pt will consistenly perform stand pivot trasnfers with RW nad CGA PT Short Term Goal 2 - Progress (Week 1): Met PT Short Term Goal 3 (Week 1): Pt will ambulate 40f with CGA and LRAD PT Short Term Goal 3 - Progress (Week 1): Met PT Short Term Goal 4 (Week 1): Pt will ascend 4 steps with min assist and 1 rail PT Short Term Goal 4 - Progress (Week 1): Met Week 2:  PT Short Term Goal 1 (Week 2): STG=LTG due to elos  Skilled Therapeutic  Interventions/Progress Updates:   Pt received supine in bed and agreeable to PT. Supine>sit transfer with supervision assist from elevated bed height. Donning hospital gown sitting EOB with supervision assist.   [facility] from elevated bed height and RW. Cues for improved anterior weigh shift for all sit<>stand transfers throughout session x 12 and supervision assist for safety.   Gait training x 947fwith CGA-supervision assist. Mild antalgic gait pattern with improved posture and step length with verbal cues from PT.   Curb management with RW to ascend/descend x 2. Min assist for safety, improved step to gait pattern, and AD management a  Stair management with 1 rail on the R to simulate access to son's house x 4 with min assist. Increasing antalgic gait pattern due to need for descent leading with the LLE.   WC mobility through hall x 15064fith cues for doorway management to force use of BUE.   [facility] returned to [facility] and [facility] and all needs met.         Therapy Documentation Precautions:  Precautions Precautions: Fall Precaution Comments: mild L hemi, wound on L knee Restrictions Weight Bearing Restrictions: No   Pain:   Denies Therapy/Group: Individual Therapy  AusLorie Phenix15/2023, 9:03 AM

## 2021-07-08 NOTE — Progress Notes (Signed)
Physical Therapy Session Note  Patient Details  Name: Laura Mendez MRN: [id number] Date of Birth: 11/28/1940  Today's Date: 07/08/2021 PT Individual Time: 1135-1205 PT Individual Time Calculation (min): 30 min   Short Term Goals: Week 1:  PT Short Term Goal 1 (Week 1): pt will perform bed mobility with supeivsion assist PT Short Term Goal 1 - Progress (Week 1): Met PT Short Term Goal 2 (Week 1): Pt will consistenly perform stand pivot trasnfers with RW nad CGA PT Short Term Goal 2 - Progress (Week 1): Met PT Short Term Goal 3 (Week 1): Pt will ambulate 36f with CGA and LRAD PT Short Term Goal 3 - Progress (Week 1): Met PT Short Term Goal 4 (Week 1): Pt will ascend 4 steps with min assist and 1 rail PT Short Term Goal 4 - Progress (Week 1): Met  Skilled Therapeutic Interventions/Progress Updates:    Pt met sitting in w/c w/ family present, and reported L knee tender to the touch. Dependent transfer to ortho gym for car transfer. Sit<>stand CGA and 5 ft ambulation w/ RW CGA to car and pt completed car transfer w/ S and VC. Increased difficulty bringing LLE into car due to pain. Recommended sliding seat further back to allow for more room during transfer. Pt ambulated ~526fw/ RW CGA from car to hallway antalgic step to gait pattern. Discussed d/c planning during rest break and use of ice for L knee bruising and swelling. Pt ambulated w/ RW CGA ~3021fack to room, ice pack applied to L knee, seat belt alarm on, call bell in hand, family present, and all needs met.  Therapy Documentation Precautions:  Precautions Precautions: Fall Precaution Comments: mild L hemi, wound on L knee Restrictions Weight Bearing Restrictions: No General:    Therapy/Group: Individual Therapy  ConLucile ShuttersPT  07/08/2021, 12:35 PM
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
## 2021-07-08 NOTE — Telephone Encounter (Signed)
Called and spoke with patient and informed her that I did refill her albuterol and sent it to the [facility] on [address]. Nothing further needed

## 2021-07-08 NOTE — Progress Notes (Signed)
Occupational Therapy Session Note  Patient Details  Name: Laura Mendez MRN: [id number] Date of Birth: 05-01-1941  Today's Date: 07/08/2021 OT Individual Time: 1035-1100 OT Individual Time Calculation (min): 25 min + 54 min   Short Term Goals: Week 1:  OT Short Term Goal 1 (Week 1): Pt will complete toilet transfer with min A and LRAD. OT Short Term Goal 2 (Week 1): Pt will complete standing grooming task with min A. OT Short Term Goal 3 (Week 1): Pt will require no more than min cuing for LUE positioning.  Skilled Therapeutic Interventions/Progress Updates:    Session 1 (1035-1100): Pt received seated in w/c no c/o pain and, agreeable to therapy. Session focus on activity tolerance, LUE NMR, BUE coordination, bimanual integration in prep for improved ADL/IADL/func mobility performance + decreased caregiver burden. Total A w/c transport to and form gym for time management/energy conservation. Pet therapy present momentarily during session, pt very appreciative and able to use LUE to pet dog.   Participated in various activities to target LUE NMR including: tossing bean bags > cornhole board, tossing/throwing ball from various vantage points with BUE, and 1x10 of the following with 1 kg medicine ball: chest press, russian core twists, overhead triceps dips.  Pt left seated in w/c with NT present and safety belt placed, call bell in reach, and all immediate needs met.    Session 2 [phone]): Pt received seated in w/c, ongoing L knee pain but did not rate and declined intervention at this time, agreeable to therapy. Session focus on activity tolerance, transfer retraining, dual tasking, LUE NMR in prep for improved ADL/IADL/func mobility performance + decreased caregiver burden.  Total A w/c transport to and from gym for time management/energy conservation. Pt Completed various sit to stands from high > lowest height on mat table; requires close S from highest height and CGA from  lowest height.  Ambulated back and forth to retrieve peg pieces to copy level 1 difficulty peg design; required overall min A to recall colors needed and for error correction; able to utilize LUE to place/pick up pieces.   Pt left seated in w/c with safety belt placed, call bell in reach, and all immediate needs met.    Therapy Documentation Precautions:  Precautions Precautions: Fall Precaution Comments: mild L hemi, wound on L knee Restrictions Weight Bearing Restrictions: No  Pain: see session note ADL: See Care Tool for more details.   Therapy/Group: Individual Therapy  Volanda Napoleon MS, OTR/L  07/08/2021, 6:52 AM

## 2021-07-08 NOTE — Progress Notes (Signed)
Patient ID: Laura Mendez, female   DOB: [birth year], 81 y.o.   MRN: [id number]  Team Conference Report to Patient/Family  Team Conference discussion was reviewed with the patient and caregiver, including goals, any changes in plan of care and target discharge date.  Patient and caregiver express understanding and are in agreement.  The patient has a target discharge date of 07/15/21.  SW met with patient and patient son ([name]). Provided team conference updates. Patient and son requesting to have knee iced daily to assist with pain and swelling. SW will provide patient son with a prescription for a stair lift. No additional questions or concerns.   Dyanne Iha 07/08/2021, 2:05 PM

## 2021-07-09 MED ORDER — TRIAMCINOLONE 0.1 % CREAM:EUCERIN CREAM 1:1
TOPICAL_CREAM | Freq: Three times a day (TID) | CUTANEOUS | Status: DC
  Filled 2021-07-09 (×2): qty 1

## 2021-07-09 MED ORDER — LORATADINE 10 MG PO TABS
10.0000 mg | ORAL_TABLET | Freq: Once | ORAL | Status: AC
  Administered 2021-07-09: 10 mg via ORAL
  Filled 2021-07-09: qty 1

## 2021-07-09 NOTE — Progress Notes (Signed)
PROGRESS NOTE   Subjective/Complaints:  Itching last noc denies using new creams or soaps , tolerating ambulation with walker in terms of knee pain Discussed stroke etiology, cardioembolic   ROS- Pt denies SOB, abd pain, CP, N/V/C/D,   Objective:   [place] COMPLETE JOINT SPACE STRUCTURES LOW LEFT  Result Date: 07/08/2021 CLINICAL DATA:  Left knee pain, fell 10 days ago EXAM: LEFT LOWER EXTREMITY SOFT TISSUE ULTRASOUND COMPLETE TECHNIQUE: Ultrasound examination was performed including evaluation of the muscles, tendons, joint, and adjacent soft tissues. COMPARISON:  06/28/2021 FINDINGS: Sonographic evaluation of the palpable area anterior to the left patella was performed, in the region of soft tissue thickening on recent CT. In the subcutaneous tissues there is an 8.3 by 6.2 x 2.2 cm complex hypoechoic fluid collection, with numerous internal septations. Given history of recent trauma, this is most consistent with subcutaneous hematoma. No internal vascularity. IMPRESSION: 1. Complex subcutaneous fluid collection overlying the left patella, most consistent with subcutaneous hematoma given history of recent trauma. This corresponds to previous CT findings. Electronically Signed   By: Randa Ngo M.D.   On: 07/08/2021 16:04   No results for input(s): WBC, HGB, HCT, PLT in the last 72 hours. No results for input(s): NA, K, CL, CO2, GLUCOSE, BUN, CREATININE, CALCIUM in the last 72 hours.  Intake/Output Summary (Last 24 hours) at 07/09/2021 0743 Last data filed at 07/08/2021 2235 Gross per 24 hour  Intake 840 ml  Output --  Net 840 ml         Physical Exam: Vital Signs Blood pressure 129/71, pulse 63, temperature (!) 97.5 F (36.4 C), temperature source Oral, resp. rate 17, height 5\' 10"  (1.778 m), weight 91.1 kg, SpO2 97 %.  General: No acute distress Mood and affect are appropriate Heart: Regular rate and rhythm no rubs murmurs or  extra sounds Lungs: Clear to auscultation, breathing unlabored, no rales , mild bilateral  wheezes Abdomen: Positive bowel sounds, soft nontender to palpation, nondistended  Extremities: No clubbing, cyanosis, or edema Skin: Left pre patellar abrasion , no active leaking cont Xeroform + foam dressing -covered for shower Neurologic: Cranial nerves II through XII intact, motor strength is 5/5 in RIght and 4/5 Left deltoid, bicep, tricep, grip, hip flexor, knee extensors, ankle dorsiflexor and plantar flexor Sensory exam normal sensation to light touch and  in bilateral upper and lower extremities  Musculoskeletal: Reduced ROM due to pain LLE , pre patellar swelling and ecchymosis, no sig joint effusion , mild tenderness    Assessment/Plan: 1. Functional deficits which require 3+ hours per day of interdisciplinary therapy in a comprehensive inpatient rehab setting. Physiatrist is providing close team supervision and 24 hour management of active medical problems listed below. Physiatrist and rehab team continue to assess barriers to discharge/monitor patient progress toward functional and medical goals  Care Tool:  Bathing    Body parts bathed by patient: Face, Right arm, Left arm, Chest, Abdomen, Front perineal area, Right upper leg, Left upper leg, Right lower leg, Left lower leg   Body parts bathed by helper: Buttocks     Bathing assist Assist Level: Minimal Assistance - Patient > 75%     Upper Body  Dressing/Undressing Upper body dressing   What is the patient wearing?: Hospital gown only    Upper body assist Assist Level: Minimal Assistance - Patient > 75%    Lower Body Dressing/Undressing Lower body dressing      What is the patient wearing?: Hospital gown only     Lower body assist Assist for lower body dressing: Minimal Assistance - Patient > 75%     Toileting Toileting    Toileting assist Assist for toileting: Moderate Assistance - Patient 50 - 74%      Transfers Chair/bed transfer  Transfers assist     Chair/bed transfer assist level: Minimal Assistance - Patient > 75% Chair/bed transfer assistive device: Walker, Clinical biochemist   Ambulation assist      Assist level: Minimal Assistance - Patient > 75% Assistive device: Walker-rolling Max distance: 45ft   Walk 10 feet activity   Assist     Assist level: Minimal Assistance - Patient > 75% Assistive device: Walker-rolling   Walk 50 feet activity   Assist Walk 50 feet with 2 turns activity did not occur: Refused         Walk 150 feet activity   Assist Walk 150 feet activity did not occur: Safety/medical concerns         Walk 10 feet on uneven surface  activity   Assist Walk 10 feet on uneven surfaces activity did not occur: Safety/medical concerns         Wheelchair     Assist Is the patient using a wheelchair?: No Type of Wheelchair: Manual    Wheelchair assist level: Minimal Assistance - Patient > 75% Max wheelchair distance: 150    Wheelchair 50 feet with 2 turns activity    Assist        Assist Level: Minimal Assistance - Patient > 75%   Wheelchair 150 feet activity     Assist      Assist Level: Minimal Assistance - Patient > 75%   Blood pressure 129/71, pulse 63, temperature (!) 97.5 F (36.4 C), temperature source Oral, resp. rate 17, height 5\' 10"  (1.778 m), weight 91.1 kg, SpO2 97 %.  Medical Problem List and Plan: 1. Functional deficits secondary to acute infarct bilateral cerebral hemispheres, right basal ganglia posterior limb right internal capsule left thalamus/embolic shower likely due to embolism source with A-fib failed Xarelto now on Eliquis               -patient may shower             -ELOS/Goals: 2/22 modI             -Continue CIR- PT, OT and SLP  2.  Antithrombotics: -DVT/anticoagulation:  Pharmaceutical: Other (comment) Eliquis             -antiplatelet therapy: N/A 3.  Left knee pain: Recommended applying ice 15 minutes three times per day.Tylenol as needed  Abrasion healing well but has persistent prepatellar swelling ? Hemorrhagic vs effusion, check [place], may need ortho to eval 4. Mood: Provide emotional support             -antipsychotic agents: N/A 5. Neuropsych: This patient is capable of making decisions on her own behalf. 6. Skin/Wound Care: Routine skin checks 7. Fluids/Electrolytes/Nutrition: Routine in and outs with follow-up chemistries 8.  Hyperlipidemia.  Continue Lipitor 9.  not on antihypertensive meds at home , will cont to monitor , normotensive today  Vitals:   07/08/21 2011 07/09/21 0348  BP:  126/76 129/71  Pulse: (!) 51 63  Resp: 18 17  Temp: 97.7 F (36.5 C) (!) 97.5 F (36.4 C)  SpO2: 95% 97%  2/16 controlled off meds  10.  History of breast cancer.  Continue Femara 11.  COPD/asthma.  Monitor oxygen saturations every shift.  Continue nebulizer as needed.  Follow-up Dr. Annamaria Boots, add flonase for rhinitis  12.  Atrial fibrillation.  Cardiac rate controlled.  Continue Eliquis.  Follow-up Dr. Marlou Porch- rate controlled no BB 13.  Diastolic congestive heart failure.  No signs of fluid overload 14. Overweight: BMI 28.82- provide dietary education    LOS: 8 days A FACE TO FACE EVALUATION WAS PERFORMED  Charlett Blake 07/09/2021, 7:43 AM

## 2021-07-09 NOTE — Progress Notes (Signed)
Received a call from nurse reporting Laura Mendez complaining of itching. She stated to the nurse she had chocolate ice cream earlier, she usually doesn't eat chocolate ice cream. Nurse assessed patient, small area of redness noted, offered Sarna lotion. Laura Mendez refused, she states she usually takes Zyrtec. Pharmacy called, Laura Mendez doesn't carry Zyrtec, Claritin ordered. Nurse is aware and verbalizes understanding.

## 2021-07-09 NOTE — Evaluation (Signed)
Recreational Therapy Assessment and Plan  Patient Details  Name: Laura Mendez MRN: [id number] Date of Birth: April 25, 1941 Today's Date: 07/09/2021  Rehab Potential:  Good ELOS:   d/c 2/22  Assessment Hospital Problem: Principal Problem:   Embolic cerebral infarction [facility])     Past Medical History:      Past Medical History:  Diagnosis Date   Asthma     Chronic airway obstruction, not elsewhere classified     Diastolic dysfunction without heart failure     Disorder of bone and cartilage, unspecified     Dysrhythmia      A-Fib   Family history of colon cancer     Family history of kidney cancer     Family history of ovarian cancer     Paroxysmal atrial flutter ([place])     Stroke ([place])      TIA   TIA (transient ischemic attack)     Unspecified asthma(493.90)      Past Surgical History:       Past Surgical History:  Procedure Laterality Date   ABDOMINAL HYSTERECTOMY       BREAST LUMPECTOMY Right 12/30/2017   BREAST LUMPECTOMY WITH RADIOACTIVE SEED LOCALIZATION Right 12/30/2017    Procedure: BREAST LUMPECTOMY WITH RADIOACTIVE SEED LOCALIZATION X'S 3;  Surgeon: Rolm Bookbinder, MD;  Location: [facility] OR;  Service: General;  Laterality: Right;   BUNIONECTOMY Bilateral     EYE SURGERY Bilateral      cataract removal      Assessment & Plan Clinical Impression: Patient is a 81 y.o. year old female with history of TIA, atrial fibrillation maintained on Xarelto followed by Dr. Marlou Porch, COPD followed by Dr. Annamaria Boots, diastolic congestive heart failure, remote history of right breast cancer with lumpectomy radioactive seed localization 2019 followed by Dr. Lindi Adie, quit smoking 44 years ago.  Per chart review patient lives alone.  Two-level home.  Independent prior to admission. Good family support. Presented 06/28/2021 after a fall while walking from the foot of her bed striking her left knee with noted left-sided weakness and slurred speech.  MRI showed acute infarct within bilateral  cerebral hemispheres, right basal ganglia, posterior limb of right internal capsule, left thalamus and within the bilateral cerebellar hemispheres.  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  MRA of head showed no large vessel occlusion or high-grade stenosis.  MRA of the neck without hemodynamically significant stenosis.  CT of the left knee showed small joint effusion as well as tricompartmental degenerative osteoarthritis changes mostly pronounced at the lateral compartment.  Admission chemistries unremarkable except glucose 113, urine drug screen negative, urinalysis negative nitrite, CK 236.  Echocardiogram with ejection fraction of 60 to 65% no wall motion abnormalities grade 1 diastolic dysfunction.  Neurology follow-up and she was transition from [place] to [place].  Tolerating a regular diet.  Therapy evaluations completed due to patient's left-sided weakness was admitted for a comprehensive rehab program. Patient transferred to CIR on 07/01/2021 .     Pt presents with decreased activity tolerance, decreased functional mobility, decreased balance, decreased problem solving and decreased memory Limiting pt's independence with leisure/community pursuits.  Met with pt today to discuss TR services including leisure education, activity analysis/modifications and stress management.  Also discussed the importance of social, emotional, spiritual health in addition to physical health and their effects on overall health and wellness.  Pt stated understanding.   Plan  Min 1 TR pt education session >45 minutes during LOS  Recommendations for other services: None  Discharge Criteria: Patient will be discharged from TR if patient refuses treatment 3 consecutive times without medical reason.  If treatment goals not met, if there is a change in medical status, if patient makes no progress towards goals or if patient is discharged from hospital.  The above assessment, treatment plan, treatment  alternatives and goals were discussed and mutually agreed upon: by patient  Session note:  No c/o pain.  Pt participated in therapy group session with an emphasis on energy conservation, home management/safety & community reintegration.  Discussed safety concerns for both home and community pursuits.  Discussion included accessing community settings, physical access, public restroom access, identification and negotiation of obstacles.  Pt easily engage in education session and was encouraging to other participants.  [place] 07/09/2021, 3:41 PM

## 2021-07-09 NOTE — Progress Notes (Signed)
Physical Therapy Session Note  Patient Details  Name: Laura Mendez MRN: [id number] Date of Birth: [birth year]  Today's Date: 07/09/2021 PT Individual Time: 1410-1505 PT Individual Time Calculation (min): 55 min   Short Term Goals:  Week 2:  PT Short Term Goal 1 (Week 2): STG=LTG due to elos  Skilled Therapeutic Interventions/Progress Updates:   Pt received sitting in WC and agreeable to PT. Pt transported to rehab gym in [facility]. Gait training with RW 2 x 145f with min assist and cues for decreased force of heel contact on the R side to force improved L gluteal activation. Sit<>stand transfers withCGA-min assist from PT througout session with min cues for increased anterior weight shift.  Pt performed dynamic standing balance/tolerance training while engaged in gross motor task of Wii bowling performed 8 frames in standing with intermittent 1-2 support. No LOB noted, but mild asymetric WB through BLE due to L knee pain.   Pt returned to room and performed stand pivot transfer to bed with RW . Sit>supine completed with supervision assist, and left supine in bed with call bell in reach and all needs met.  Ice pack applied to the L knee for pain management.       Therapy Documentation Precautions:  Precautions Precautions: Fall Precaution Comments: mild L hemi, wound on L knee Restrictions Weight Bearing Restrictions: No  Vital Signs: Therapy Vitals Pulse Rate: 71 Resp: 20 BP: 122/67 Patient Position (if appropriate): Sitting Oxygen Therapy SpO2: 97 % O2 Device: Room Air Pain: Denies at rest      Therapy/Group: Individual Therapy  ALorie Phenix2/16/2023, 5:36 PM

## 2021-07-09 NOTE — Progress Notes (Signed)
Occupational Therapy Session Note  Patient Details  Name: Laura Mendez MRN: [id number] Date of Birth: [birth year]  Today's Date: 07/09/2021 OT Group Time: 1030-1130 OT Group Time Calculation (min): 60 min   Short Term Goals: Week 1:  OT Short Term Goal 1 (Week 1): Pt will complete toilet transfer with min A and LRAD. OT Short Term Goal 2 (Week 1): Pt will complete standing grooming task with min A. OT Short Term Goal 3 (Week 1): Pt will require no more than min cuing for LUE positioning.  Skilled Therapeutic Interventions/Progress Updates:  Pt participated in group session with a focus on community re-entry, energy conservation strategies ( ECS) and safe home set- up for DC. Pt reports he wants to be able to go to [employer] and get her coffee. Discussed compensatory methods for task at home.  Discussed safe recommendations for navigating various surface terrains as pt currently using RW for functional mobility. Discussed energy conservation strategies in relation to community re-entry such as always having a plan, pacing yourself, using handicap placard, and how to navigate community toileting . Education also provided on ECS for navigating stores etc such as using motorized cart when necessary, being aware of various obstacles in stores,  and delegating shopping tasks to family members as appropriate. Pt very talkative during session sharing ideas to other group members. Issued pt educational handouts to increase carryover about ECS and community re-entry. Pt transported back to room by NT.  Therapy Documentation Precautions:  Precautions Precautions: Fall Precaution Comments: mild L hemi, wound on L knee Restrictions Weight Bearing Restrictions: No   Pain: no pain reported during session     Therapy/Group: Group Therapy  Precious Haws 07/09/2021, 12:27 PM

## 2021-07-09 NOTE — Progress Notes (Signed)
Pt c/o itching all over, a few small red patches noted on the pts arms. Pt denies SOB or chest pain. On call provider E. Marcello Moores has been made aware.

## 2021-07-09 NOTE — Progress Notes (Signed)
Occupational Therapy Session Note  Patient Details  Name: Laura Mendez MRN: [id number] Date of Birth: Jan 03, 1941  Today's Date: 07/09/2021 OT Individual Time: 1300-1345 OT Individual Time Calculation (min): 45 min    Short Term Goals: Week 1:  OT Short Term Goal 1 (Week 1): Pt will complete toilet transfer with min A and LRAD. OT Short Term Goal 2 (Week 1): Pt will complete standing grooming task with min A. OT Short Term Goal 3 (Week 1): Pt will require no more than min cuing for LUE positioning.  Skilled Therapeutic Interventions/Progress Updates:     Pt received in w/c with ice pack on L knee and c/o mild L knee pain with STS, but rest provided PRN  ADL: Pt completes ADL at overall CGA Level. Skilled interventions include: VC for anterior weight shift and MIN facilitation of weight shift in transitional movements, cuing for RW management to keep walker with pt when backing up to surfaces, upright posture facilitation with tactile cuing and MIN A to maintain LLE in figure 4 position for fastening shoes.    Therapeutic activity Pt completes functonal mobility with RW to tx gym with CGA and w/c follow with cuing for forward gaze and posture. Pt completes seated [place] of small puzzle pieces for palm<>finger translation, stacking and connecting pieces with cuing to decrease gravity compensations.   Pt left at end of session in w/c with exit alarm on, call light in reach and all needs met   Therapy Documentation Precautions:  Precautions Precautions: Fall Precaution Comments: mild L hemi, wound on L knee Restrictions Weight Bearing Restrictions: No General:     Therapy/Group: Individual Therapy  Tonny Branch 07/09/2021, 6:58 AM

## 2021-07-09 NOTE — Progress Notes (Signed)
Patient refused kerlix and ace wrap on her left knee; prefers ice pack and open to air.

## 2021-07-09 NOTE — Progress Notes (Signed)
Speech Language Pathology Weekly Progress and Session Note  Patient Details  Name: Laura Mendez MRN: [id number] Date of Birth: 10-21-1940  Beginning of progress report period: July 02, 2021 End of progress report period: July 09, 2021  Today's Date: 07/09/2021 SLP Individual Time: 1121-6244 SLP Individual Time Calculation (min): 30 min  Short Term Goals: Week 1: SLP Short Term Goal 1 (Week 1): Patient will recall 2-3 speech intelligibility strategies to increase intelligibility when fatigued with minA verbal cues. SLP Short Term Goal 1 - Progress (Week 1): Met SLP Short Term Goal 2 (Week 1): Patient will recall and demonstrate use of memory compensations to increase independence and safety at home with minA verbal cues. SLP Short Term Goal 2 - Progress (Week 1): Met SLP Short Term Goal 3 (Week 1): Patient will identify 2-3 areas of impairment to increase awareness of deficits with minA verbal cues. SLP Short Term Goal 3 - Progress (Week 1): Met  New Short Term Goals: Week 2: SLP Short Term Goal 1 (Week 2): STG=LTG due to ELOS  Weekly Progress Updates: Patient has made excellent gains and has met 3 of 3 STGs this reporting period. Patient is currently completing functional and complex cognitive tasks with sup A cues in regards to recall and emergent awareness. Patient benefits from supervision cues for use of internal and external compensatory memory aids. Patient met goals for speech intelligibility and currently implements speech strategies with independence to achieve 100% intelligibility. Patient and family education is ongoing. Patient would benefit from continued skilled SLP intervention to maximize cognitive functioning and overall functional independence prior to discharge.   Intensity: Minumum of 1-2 x/day, 30 to 90 minutes Frequency: 3 to 5 out of 7 days Duration/Length of Stay: 2/22 Treatment/Interventions: Cognitive remediation/compensation;Internal/external  aids;Cueing hierarchy;Environmental controls;Therapeutic Activities;Functional tasks;Patient/family education;Therapeutic Exercise   Daily Session Skilled Therapeutic Interventions: Skilled ST treatment focused on cognitive goals. SLP facilitated session by providing sup A verbal cues for use of internal memory aids to increase recall for word list retention by attribute, as well as visual memory task to achieve 85% accuracy. Pt also engaged in discussion on external memory strategies and their application in functional settings such as Dr's office, grocery store, for paying bills, medication management, etc. Pt verbalized understanding via teach back and providing times in which such strategies would be beneficial. Patient was left in wheelchair with alarm belt activated. Left patient in ADL apartment for following session. Continue per current plan of care.      General    Pain Pain Assessment Pain Scale: 0-10 Pain Score: 0-No pain  Therapy/Group: Individual Therapy  Patty Sermons 07/09/2021, 12:19 PM

## 2021-07-10 LAB — CBC
HCT: 37.5 % (ref 36.0–46.0)
Hemoglobin: 11.8 g/dL — ABNORMAL LOW (ref 12.0–15.0)
MCH: 28.9 pg (ref 26.0–34.0)
MCHC: 31.5 g/dL (ref 30.0–36.0)
MCV: 91.7 fL (ref 80.0–100.0)
Platelets: 273 10*3/uL (ref 150–400)
RBC: 4.09 MIL/uL (ref 3.87–5.11)
RDW: 12.9 % (ref 11.5–15.5)
WBC: 6.5 10*3/uL (ref 4.0–10.5)
nRBC: 0 % (ref 0.0–0.2)

## 2021-07-10 NOTE — Progress Notes (Signed)
Occupational Therapy Session Note  Patient Details  Name: Laura Mendez MRN: [id number] Date of Birth: Apr 09, 1941  Today's Date: 07/10/2021 OT Individual Time: 1015-1100 OT Individual Time Calculation (min): 45 min    Short Term Goals: Week 1:  OT Short Term Goal 1 (Week 1): Pt will complete toilet transfer with min A and LRAD. OT Short Term Goal 2 (Week 1): Pt will complete standing grooming task with min A. OT Short Term Goal 3 (Week 1): Pt will require no more than min cuing for LUE positioning.  Skilled Therapeutic Interventions/Progress Updates:    Pt received in bed with slight discomfort but unrated pain in L knee from hematoma. Pt with ADL needs met   Therapeutic activity Pt completes functional mobility to/from tx gym with RW with VC for upright posture and proximity to walker when fatigued + CGA overall. Pt completes standing in regular and close stance for 2 trials of standing 10-15 min per trial while playing checkers for forced use of LUE for NMR/functional reach. Pt tolerates well but requires increased A (up to min A) for static stance with no UE support on table with feet close together and VC for equal weight bearing over BLE.   Pt left at end of session in w/c with supervision by rehab tech awaiting group   Therapy Documentation Precautions:  Precautions Precautions: Fall Precaution Comments: mild L hemi, wound on L knee Restrictions Weight Bearing Restrictions: No  Therapy/Group: Individual Therapy  Tonny Branch 07/10/2021, 6:57 AM

## 2021-07-10 NOTE — Progress Notes (Signed)
Recreational Therapy Session Note  Patient Details  Name: Aniah Pauli MRN: [id number] Date of Birth: November 08, 1940 Today's Date: 07/10/2021 Time:  11-12 Pain: no c/o Skilled Therapeutic Interventions/Progress Updates: Pt participated in pt education group with an emphasis on stress managment/coping.  Pt actively participated in group discussion focused on stress exploration including factors that contribute to stress, factors that protect against stress and potential coping strategies.  Coping strategies included deep breathing, progressive muscle relaxation, imagery & challenging irrational thoughts.  Handouts provided.  Therapy/Group: Group Therapy  Blimi Godby 07/10/2021, 12:30 PM

## 2021-07-10 NOTE — Progress Notes (Signed)
Physical Therapy Session Note  Patient Details  Name: Laura Mendez MRN: [id number] Date of Birth: 10-23-1940  Today's Date: 07/10/2021 PT Individual Time: 0805-0902 PT Individual Time Calculation (min): 57 min   Short Term Goals: Week 2:  PT Short Term Goal 1 (Week 2): STG=LTG due to elos  Skilled Therapeutic Interventions/Progress Updates:   Pt received sitting EOB and agreeable to PT. PT assisted to don shoes sitting EOB.   Ambulatory transfer from bed to [facility] at elevated height with RW and supervision assist. Gait training with RW and supervision assist x 133f + 1624fto room. Cues for improved posture. Noted to have increased step length and normalized heel contact compared to previous sessions.   Dynamic gait training in parallel bars side stepping R and L  2 bouts,  2 x 1052fCues for increased step length on the RLE and sustained knee extension on the LLE   Blocked practice sit<>stand x 5 with supervision assist and min-mod cues for improved anterior weight shift prior to push to standing or initiation of hip extension.     Patient returned to room and left sitting in WC [facility] call bell in reach and all needs met.  Ice pack on the LLE for edema control.    Therapy Documentation Precautions:  Precautions Precautions: Fall Precaution Comments: mild L hemi, wound on L knee Restrictions Weight Bearing Restrictions: No  Vital Signs: Oxygen Therapy SpO2: 94 % O2 Device: Room Air Pain:  Denies at rest     Therapy/Group: Individual Therapy  AusLorie Phenix17/2023, 9:08 AM

## 2021-07-10 NOTE — Progress Notes (Signed)
Occupational Therapy Weekly Progress Note  Patient Details  Name: Laura Mendez MRN: [id number] Date of Birth: August 31, 1940  Beginning of progress report period: July 02, 2021 End of progress report period: July 10, 2021   Patient has met 3 of 3 short term goals.  Pt has made great progress this week towards LTG. Pt has demonstrated improved functional use of LUE, dynamic standing balance, ability to compensate for deficits, activity tolerance to presently complete ADL and bathroom transfers at overall CGA level with use of RW. Pt cont to be primarily limited by L knee pain and mild L hemiparesis. LUE and hand currently assessed at Brummstrom level V and VI respectively. Anticipate intermittent S and S physical assist required upon DC. Hands on family education not yet initiated.   Patient continues to demonstrate the following deficits: muscle weakness, decreased cardiorespiratoy endurance, impaired timing and sequencing, decreased coordination, and decreased motor planning, decreased attention to left and decreased motor planning, decreased problem solving and decreased memory, and decreased standing balance, decreased postural control, hemiplegia, and decreased balance strategies and therefore will continue to benefit from skilled OT intervention to enhance overall performance with BADL, iADL, and Reduce care partner burden.  Patient progressing toward long term goals..  Continue plan of care.  OT Short Term Goals Week 1:  OT Short Term Goal 1 (Week 1): Pt will complete toilet transfer with min A and LRAD. OT Short Term Goal 1 - Progress (Week 1): Met OT Short Term Goal 2 (Week 1): Pt will complete standing grooming task with min A. OT Short Term Goal 2 - Progress (Week 1): Met OT Short Term Goal 3 (Week 1): Pt will require no more than min cuing for LUE positioning. OT Short Term Goal 3 - Progress (Week 1): Met Week 2:  OT Short Term Goal 1 (Week 2): STG = LTG 2/2  ELOS   Volanda Napoleon MS, OTR/L  07/10/2021, 1:09 PM

## 2021-07-10 NOTE — Progress Notes (Signed)
Occupational Therapy Session Note  Patient Details  Name: Laura Mendez MRN: [id number] Date of Birth: 25-Aug-1940  Today's Date: 07/10/2021 OT Group Time: 1100-1200 OT Group Time Calculation (min): 60 min   Short Term Goals: Week 1:  OT Short Term Goal 1 (Week 1): Pt will complete toilet transfer with min A and LRAD. OT Short Term Goal 2 (Week 1): Pt will complete standing grooming task with min A. OT Short Term Goal 3 (Week 1): Pt will require no more than min cuing for LUE positioning.  Skilled Therapeutic Interventions/Progress Updates:  Pt participated in group session with a focus on stress mgmt, education on healthy coping strategies, and social interaction. Focus of session on providing coping strategies to manage new current level of function as a result of new diagnosis.  Session focus on breaking down stressors into daily hassles, major life stressors and life circumstances in an effort to allow pts to chunk their stressors into groups. Pt actively sharing stressors and contributing to group conversation. Provided active listening, emotional support and therapeutic use of self. Offered education on factors that protect [place] against stress such as daily uplifts, healthy coping strategies and protective factors. Encouraged all group members to make an effort to actively recall one event from their day that was a daily uplift in an effort to protect their mindset from stressors as well as sharing this information with their caregivers to facilitate improved caregiver communication and decrease overall burden of care.  Issued pt handouts on healthy coping strategies to implement into routine. Pt transported back to room by RT.  Therapy Documentation Precautions:  Precautions Precautions: Fall Precaution Comments: mild L hemi, wound on L knee Restrictions Weight Bearing Restrictions: No   Pain: no pain reported during session     Therapy/Group: Group  Therapy  Precious Haws 07/10/2021, 12:35 PM

## 2021-07-10 NOTE — Progress Notes (Signed)
PROGRESS NOTE   Subjective/Complaints: Reviewed CT knee Left knee pain improving, no sweats or chills , no increased redness , CBC essentially normal (borderline anemia)  Discussed stroke etiology, cardioembolic   ROS- Pt denies SOB, abd pain, CP, N/V/C/D,   Objective:   [place] COMPLETE JOINT SPACE STRUCTURES LOW LEFT  Result Date: 07/08/2021 CLINICAL DATA:  Left knee pain, fell 10 days ago EXAM: LEFT LOWER EXTREMITY SOFT TISSUE ULTRASOUND COMPLETE TECHNIQUE: Ultrasound examination was performed including evaluation of the muscles, tendons, joint, and adjacent soft tissues. COMPARISON:  06/28/2021 FINDINGS: Sonographic evaluation of the palpable area anterior to the left patella was performed, in the region of soft tissue thickening on recent CT. In the subcutaneous tissues there is an 8.3 by 6.2 x 2.2 cm complex hypoechoic fluid collection, with numerous internal septations. Given history of recent trauma, this is most consistent with subcutaneous hematoma. No internal vascularity. IMPRESSION: 1. Complex subcutaneous fluid collection overlying the left patella, most consistent with subcutaneous hematoma given history of recent trauma. This corresponds to previous CT findings. Electronically Signed   By: Randa Ngo M.D.   On: 07/08/2021 16:04   Recent Labs    07/10/21 0544  WBC 6.5  HGB 11.8*  HCT 37.5  PLT 273   No results for input(s): NA, K, CL, CO2, GLUCOSE, BUN, CREATININE, CALCIUM in the last 72 hours.  Intake/Output Summary (Last 24 hours) at 07/10/2021 0739 Last data filed at 07/09/2021 1305 Gross per 24 hour  Intake 360 ml  Output --  Net 360 ml         Physical Exam: Vital Signs Blood pressure (!) 112/57, pulse (!) 57, temperature 97.6 F (36.4 C), temperature source Oral, resp. rate 14, height 5\' 10"  (1.778 m), weight 91.1 kg, SpO2 94 %.  General: No acute distress Mood and affect are appropriate Heart:  Regular rate and rhythm no rubs murmurs or extra sounds Lungs: Clear to auscultation, breathing unlabored, no rales , mild bilateral  wheezes Abdomen: Positive bowel sounds, soft nontender to palpation, nondistended  Extremities: No clubbing, cyanosis, or edema Skin: Left pre patellar abrasion , no active leaking cont Xeroform + foam dressing -covered for shower Neurologic: Cranial nerves II through XII intact, motor strength is 5/5 in RIght and 4/5 Left deltoid, bicep, tricep, grip, hip flexor, knee extensors, ankle dorsiflexor and plantar flexor Sensory exam normal sensation to light touch and  in bilateral upper and lower extremities  Musculoskeletal: Reduced ROM due to pain LLE , pre patellar swelling and ecchymosis, no sig joint effusion , mild tenderness    Assessment/Plan: 1. Functional deficits which require 3+ hours per day of interdisciplinary therapy in a comprehensive inpatient rehab setting. Physiatrist is providing close team supervision and 24 hour management of active medical problems listed below. Physiatrist and rehab team continue to assess barriers to discharge/monitor patient progress toward functional and medical goals  Care Tool:  Bathing    Body parts bathed by patient: Face, Right arm, Left arm, Chest, Abdomen, Front perineal area, Right upper leg, Left upper leg, Right lower leg, Left lower leg   Body parts bathed by helper: Buttocks     Bathing assist Assist Level: Minimal  Assistance - Patient > 75%     Upper Body Dressing/Undressing Upper body dressing   What is the patient wearing?: Hospital gown only    Upper body assist Assist Level: Minimal Assistance - Patient > 75%    Lower Body Dressing/Undressing Lower body dressing      What is the patient wearing?: Hospital gown only     Lower body assist Assist for lower body dressing: Minimal Assistance - Patient > 75%     Toileting Toileting    Toileting assist Assist for toileting: Moderate  Assistance - Patient 50 - 74%     Transfers Chair/bed transfer  Transfers assist     Chair/bed transfer assist level: Minimal Assistance - Patient > 75% Chair/bed transfer assistive device: Walker, Clinical biochemist   Ambulation assist      Assist level: Minimal Assistance - Patient > 75% Assistive device: Walker-rolling Max distance: 9ft   Walk 10 feet activity   Assist     Assist level: Minimal Assistance - Patient > 75% Assistive device: Walker-rolling   Walk 50 feet activity   Assist Walk 50 feet with 2 turns activity did not occur: Refused         Walk 150 feet activity   Assist Walk 150 feet activity did not occur: Safety/medical concerns         Walk 10 feet on uneven surface  activity   Assist Walk 10 feet on uneven surfaces activity did not occur: Safety/medical concerns         Wheelchair     Assist Is the patient using a wheelchair?: No Type of Wheelchair: Manual    Wheelchair assist level: Minimal Assistance - Patient > 75% Max wheelchair distance: 150    Wheelchair 50 feet with 2 turns activity    Assist        Assist Level: Minimal Assistance - Patient > 75%   Wheelchair 150 feet activity     Assist      Assist Level: Minimal Assistance - Patient > 75%   Blood pressure (!) 112/57, pulse (!) 57, temperature 97.6 F (36.4 C), temperature source Oral, resp. rate 14, height 5\' 10"  (1.778 m), weight 91.1 kg, SpO2 94 %.  Medical Problem List and Plan: 1. Functional deficits secondary to acute infarct bilateral cerebral hemispheres, right basal ganglia posterior limb right internal capsule left thalamus/embolic shower likely due to embolism source with A-fib failed Xarelto now on Eliquis               -patient may shower             -ELOS/Goals: 2/22 modI             -Continue CIR- PT, OT and SLP  2.  Antithrombotics: -DVT/anticoagulation:  Pharmaceutical: Other (comment) Eliquis              -antiplatelet therapy: N/A 3. Left knee pain: Recommended applying ice 15 minutes three times per day.Tylenol as needed- reviewed CT L knee, OA, soft tissue hematoma and blood in patellar bursa   Abrasion healing well but has persistent prepatellar swelling + Hemorrhagic effusion, WBAT, no sign of soft tissue infection  4. Mood: Provide emotional support             -antipsychotic agents: N/A 5. Neuropsych: This patient is capable of making decisions on her own behalf. 6. Skin/Wound Care: Routine skin checks 7. Fluids/Electrolytes/Nutrition: Routine in and outs with follow-up chemistries 8.  Hyperlipidemia.  Continue Lipitor  9.  not on antihypertensive meds at home , will cont to monitor , normotensive today  Vitals:   07/10/21 0435 07/10/21 0727  BP: (!) 112/57   Pulse: (!) 57   Resp: 14   Temp: 97.6 F (36.4 C)   SpO2: 93% 94%  2/17 controlled off meds  10.  History of breast cancer.  Continue Femara 11.  COPD/asthma.  Monitor oxygen saturations every shift.  Continue nebulizer as needed.  Follow-up Dr. Annamaria Boots, add flonase for rhinitis  12.  Atrial fibrillation.  Cardiac rate controlled.  Continue Eliquis.  Follow-up Dr. Marlou Porch- rate controlled no BB 13.  Diastolic congestive heart failure.  No signs of fluid overload 14. Overweight: BMI 28.82- provide dietary education    LOS: 9 days A FACE TO FACE EVALUATION WAS PERFORMED  Charlett Blake 07/10/2021, 7:39 AM

## 2021-07-11 NOTE — Progress Notes (Signed)
Speech Language Pathology Discharge Summary  Patient Details  Name: Laura Mendez MRN: [id number] Date of Birth: 02/13/1941  Today's Date: 07/11/2021 SLP Individual Time: 1015-1055 SLP Individual Time Calculation (min): 40 min   Skilled Therapeutic Interventions:  Skilled treatment session focused on cognitive goals. SLP facilitated session by  re-administering the [facility] Mental Status Examination (SLUMS). Patient scored  27/30 points with a score of 27 or above considered normal. SLP also provided education regarding memory compensatory strategies and how to maintain a healthy brain lifestyle. Handouts were given to reinforce information. Due to patient's progress and reports that she is at her cognitive baseline, patient will be discharged from skilled SLP intervention with f/u not warranted at this time. Patient verbalized understanding and agreement.     Patient has met 3 of 3 long term goals.  Patient to discharge at overall Modified Independent level.   Reasons goals not met: N/A   Clinical Impression/Discharge Summary: Patient has made excellent gains and has met 3 of 3 LTGs this admission. Currently, patient is completing functional and mildly complex tasks safely with extra time and intermittent supervision level verbal cues in regards to recall and problem solving. Patient is also 100% intelligible at the conversation level and is Mod I for use of speech intelligibility strategies. Patient education is complete and due to patient's progress, patient will be discharged from skilled SLP intervention with f/u not warranted at this time. Patient verbalized understanding and agreement.   Care Partner:  Caregiver Able to Provide Assistance: Yes     Recommendation:  None      Equipment: N/A   Reasons for discharge: Treatment goals met   Patient/Family Agrees with Progress Made and Goals Achieved: Yes    [place], [place] 07/11/2021, 10:51 AM

## 2021-07-11 NOTE — Progress Notes (Signed)
Physical Therapy Session Note  Patient Details  Name: Laura Mendez MRN: [id number] Date of Birth: 1940-05-31  Today's Date: 07/11/2021 PT Individual Time: 0803-0900 PT Individual Time Calculation (min): 57 min   Short Term Goals: Week 2:  PT Short Term Goal 1 (Week 2): STG=LTG due to elos   Skilled Therapeutic Interventions/Progress Updates:  Patient semi-reclined in bed on entrance to room. Son present. Patient alert and agreeable to PT session. Bandage has been removed from L knee and although she demos continued bruising and swelling over L knee, it is much reduced since previous week.  Son questioning pt's ability to progress to cane for AD.   Patient with mild pain complaint throughout session at L knee. But relates that pain at knee continues to improve with reduction in swelling and bruising.  Therapeutic Activity: Bed Mobility: Patient performed supine --> sit with supervision and use of bed features. VC/ tc required for effort. Transfers: Patient performed sit<>stand and stand pivot transfers throughout session with CGA/ supervision. Much improvement noted since previously working with pt earlier this week. Improved forward lean without consistent vc as reminder.   Gait Training:  Patient ambulated 54' x2 using RW with CGA/ supervision. Demonstrated lean into RW especially with WB to LLE. Attempt for amb with no AD, and pt noted in taking smaller steps with greater heistancy to bear weight over LLE. With reintroduction of RW, quality of gait improves. Related to son that pt will be able to progress away from RW with continued healing to L knee, however at this time, RW is best option for safe ambulation. Will need to address with next level of care.   She is able to ascend and descend four 6" steps with light CGA/ supervision to ascend and HHA/ MinA to RUE with LUE on railing and CGA to descend. Improvement over previous attempt. Will need family education on assist  required for descent.   Patient seated  in w/c at end of session with brakes locked, belt alarm set, and all needs within reach.     Therapy Documentation Precautions:  Precautions Precautions: Fall Precaution Comments: mild L hemi, wound on L knee Restrictions Weight Bearing Restrictions: No General:   Vital Signs: Therapy Vitals Pulse Rate: (!) 59 Resp: 16 Oxygen Therapy SpO2: 96 % O2 Device: Room Air Pain: Pain Assessment Pain Scale: 0-10 Pain Score: 0-No pain  Therapy/Group: Individual Therapy  Alger Simons PT, DPT 07/11/2021, 12:22 PM

## 2021-07-11 NOTE — Progress Notes (Signed)
Physical Therapy Session Note  Patient Details  Name: Laura Mendez MRN: [id number] Date of Birth: 04-17-1941  Today's Date: 07/11/2021 PT Individual Time: 1440-1510 PT Individual Time Calculation (min): 30 min   Short Term Goals:  Week 2:  PT Short Term Goal 1 (Week 2): STG=LTG due to elos   Skilled Therapeutic Interventions/Progress Updates:   Pt received supine in bed and agreeable to PT. Supine>sit transfer with out assist assist and mild use of bed features. PT assisted to don socks and shoes sitting EOB.   Stand pivot transfer to [facility] with distant supervision assist and cues for decreased speed with descent in to chair. Gait training with RW x 29f with distant supervision assist and RW. No antalgic gait pattern noted.  Dynamic gait training to simulate stepping over 1 inch threshold x 6 with supoervision assist and cues for improved AD management to increases step length to clear obstacles.   Step training with RW to ascend/descend 6inch step simulating access to family room at son's house. Pt performed with supervision assist with only min cues for Ad management and step to gait pattern to reduce fall risk.   Pt returned to room and performed stand pivot transfer to bed with supervision assist and RW. Sit>supine completed without assist and left supine in bed with call bell in reach and all needs met.        Therapy Documentation Precautions:  Precautions Precautions: Fall Precaution Comments: mild L hemi, wound on L knee Restrictions Weight Bearing Restrictions: No    Vital Signs: Therapy Vitals Temp: 97.8 F (36.6 C) Pulse Rate: 69 Resp: 18 BP: 137/71 Patient Position (if appropriate): Sitting Oxygen Therapy SpO2: 97 % O2 Device: Room Air Pain:  denies    Therapy/Group: Individual Therapy  ALorie Phenix2/18/2023, 3:12 PM

## 2021-07-12 NOTE — Progress Notes (Signed)
PROGRESS NOTE   Subjective/Complaints:  No issues overnite , practiced going up and down steps .  Duiscussed prognosis for functional recovery , ability to go home, transition to cane   ROS- Pt denies SOB, abd pain, CP, N/V/C/D,   Objective:   No results found. Recent Labs    07/10/21 0544  WBC 6.5  HGB 11.8*  HCT 37.5  PLT 273    No results for input(s): NA, K, CL, CO2, GLUCOSE, BUN, CREATININE, CALCIUM in the last 72 hours.  Intake/Output Summary (Last 24 hours) at 07/12/2021 0756 Last data filed at 07/11/2021 1900 Gross per 24 hour  Intake 404 ml  Output --  Net 404 ml         Physical Exam: Vital Signs Blood pressure 136/70, pulse 69, temperature 97.9 F (36.6 C), resp. rate 16, height 5\' 10"  (1.778 m), weight 91.1 kg, SpO2 96 %.  General: No acute distress Mood and affect are appropriate Heart: Regular rate and rhythm no rubs murmurs or extra sounds Lungs: Clear to auscultation, breathing unlabored, no rales , mild bilateral  wheezes Abdomen: Positive bowel sounds, soft nontender to palpation, nondistended  Extremities: No clubbing, cyanosis, or edema Skin: Left pre patellar abrasion healed  Neurologic: Cranial nerves II through XII intact, motor strength is 5/5 in RIght and 4/5 Left deltoid, bicep, tricep, grip, hip flexor, knee extensors, ankle dorsiflexor and plantar flexor Sensory exam normal sensation to light touch and  in bilateral upper and lower extremities  Musculoskeletal: Reduced ROM due to pain LLE , pre patellar swelling a no sig joint effusion , mild tenderness    Assessment/Plan: 1. Functional deficits which require 3+ hours per day of interdisciplinary therapy in a comprehensive inpatient rehab setting. Physiatrist is providing close team supervision and 24 hour management of active medical problems listed below. Physiatrist and rehab team continue to assess barriers to  discharge/monitor patient progress toward functional and medical goals  Care Tool:  Bathing    Body parts bathed by patient: Face, Right arm, Left arm, Chest, Abdomen, Front perineal area, Right upper leg, Left upper leg, Right lower leg, Left lower leg   Body parts bathed by helper: Buttocks     Bathing assist Assist Level: Minimal Assistance - Patient > 75%     Upper Body Dressing/Undressing Upper body dressing   What is the patient wearing?: Hospital gown only    Upper body assist Assist Level: Minimal Assistance - Patient > 75%    Lower Body Dressing/Undressing Lower body dressing      What is the patient wearing?: Hospital gown only     Lower body assist Assist for lower body dressing: Minimal Assistance - Patient > 75%     Toileting Toileting    Toileting assist Assist for toileting: Moderate Assistance - Patient 50 - 74%     Transfers Chair/bed transfer  Transfers assist     Chair/bed transfer assist level: Minimal Assistance - Patient > 75% Chair/bed transfer assistive device: Walker, Clinical biochemist   Ambulation assist      Assist level: Minimal Assistance - Patient > 75% Assistive device: Walker-rolling Max distance: 89ft   Walk 10 feet  activity   Assist     Assist level: Minimal Assistance - Patient > 75% Assistive device: Walker-rolling   Walk 50 feet activity   Assist Walk 50 feet with 2 turns activity did not occur: Refused         Walk 150 feet activity   Assist Walk 150 feet activity did not occur: Safety/medical concerns         Walk 10 feet on uneven surface  activity   Assist Walk 10 feet on uneven surfaces activity did not occur: Safety/medical concerns         Wheelchair     Assist Is the patient using a wheelchair?: No Type of Wheelchair: Manual    Wheelchair assist level: Minimal Assistance - Patient > 75% Max wheelchair distance: 150    Wheelchair 50 feet with 2 turns  activity    Assist        Assist Level: Minimal Assistance - Patient > 75%   Wheelchair 150 feet activity     Assist      Assist Level: Minimal Assistance - Patient > 75%   Blood pressure 136/70, pulse 69, temperature 97.9 F (36.6 C), resp. rate 16, height 5\' 10"  (1.778 m), weight 91.1 kg, SpO2 96 %.  Medical Problem List and Plan: 1. Functional deficits secondary to acute infarct bilateral cerebral hemispheres, right basal ganglia posterior limb right internal capsule left thalamus/embolic shower likely due to embolism source with A-fib failed Xarelto now on Eliquis               -patient may shower             -ELOS/Goals: 2/22 modI             -Continue CIR- PT, OT and SLP  2.  Antithrombotics: -DVT/anticoagulation:  Pharmaceutical: Other (comment) Eliquis             -antiplatelet therapy: N/A 3. Left knee pain: Recommended applying ice 15 minutes three times per day.Tylenol as needed- reviewed CT L knee, OA, soft tissue hematoma and blood in patellar bursa   Abrasion healing well but has persistent prepatellar swelling + Hemorrhagic effusion, WBAT, no sign of soft tissue infection  4. Mood: Provide emotional support             -antipsychotic agents: N/A 5. Neuropsych: This patient is capable of making decisions on her own behalf. 6. Skin/Wound Care: Routine skin checks 7. Fluids/Electrolytes/Nutrition: Routine in and outs with follow-up chemistries 8.  Hyperlipidemia.  Continue Lipitor 9.  not on antihypertensive meds at home , will cont to monitor , normotensive today  Vitals:   07/11/21 1955 07/12/21 0354  BP: 133/65 136/70  Pulse: 62 69  Resp: 16 16  Temp: 98 F (36.7 C) 97.9 F (36.6 C)  SpO2: 97% 96%  2/19 controlled off meds  10.  History of breast cancer.  Continue Femara 11.  COPD/asthma.  Monitor oxygen saturations every shift.  Continue nebulizer as needed.  Follow-up Dr. Annamaria Boots, add flonase for rhinitis  12.  Atrial fibrillation.  Cardiac rate  controlled.  Continue Eliquis.  Follow-up Dr. Marlou Porch- rate controlled no BB 13.  Diastolic congestive heart failure.  No signs of fluid overload 14. Overweight: BMI 28.82- provide dietary education    LOS: 11 days A FACE TO FACE EVALUATION WAS PERFORMED  Laura Mendez 07/12/2021, 7:56 AM

## 2021-07-13 LAB — GLUCOSE, CAPILLARY: Glucose-Capillary: 104 mg/dL — ABNORMAL HIGH (ref 70–99)

## 2021-07-13 NOTE — Progress Notes (Signed)
Occupational Therapy Session Note  Patient Details  Name: Laura Mendez MRN: [id number] Date of Birth: 04/27/1941  Today's Date: 07/13/2021 OT Individual Time: 1402-1500 OT Individual Time Calculation (min): 58 min    Short Term Goals: Week 2:  OT Short Term Goal 1 (Week 2): STG = LTG 2/2 ELOS  Skilled Therapeutic Interventions/Progress Updates:    Pt greeted seated In wc and agreeable to OT treatment session. OT showed pt a walker tray and discussed how she could utilize it to be more independent when transferring items from the kitchen. Pt declined to try this stating her son will carry everything for her. Also educated on a standard RW bag, but she also declined this. Discussed patients goals for this therapy session and she wanted to work on stairs, stepping, and balance. OT had patient practice walking up a 4 steps with R handrail only in simulated home environment. She needed freuqent cues to recall which leg goes first, but OT educated on different ways to remember. Pt then practiced stepping up and down off of platform to simulate her sons sunken living room step down. Standing balance/endurance with standing [employer] activity focusing on hip and ankle strategy with weight shifting. Pt returned to room and OT provided ice pack for pain management. Pt left seated in wc with alarm on, call bell in reach, and needs met.   Therapy Documentation Precautions:  Precautions Precautions: Fall Precaution Comments: mild L hemi, wound on L knee Restrictions Weight Bearing Restrictions: No Pain:  Pain in L knee, no number given. Ice pack for pain management.    Therapy/Group: Individual Therapy  Valma Cava 07/13/2021, 4:10 PM

## 2021-07-13 NOTE — Progress Notes (Signed)
Occupational Therapy Session Note  Patient Details  Name: Laura Mendez MRN: [id number] Date of Birth: 11/13/1940  Today's Date: 07/13/2021 OT Individual Time: 1100-1200 OT Individual Time Calculation (min): 60 min    Short Term Goals: Week 1:  OT Short Term Goal 1 (Week 1): Pt will complete toilet transfer with min A and LRAD. OT Short Term Goal 1 - Progress (Week 1): Met OT Short Term Goal 2 (Week 1): Pt will complete standing grooming task with min A. OT Short Term Goal 2 - Progress (Week 1): Met OT Short Term Goal 3 (Week 1): Pt will require no more than min cuing for LUE positioning. OT Short Term Goal 3 - Progress (Week 1): Met  Skilled Therapeutic Interventions/Progress Updates:     Pt received in w/c with no pain reported and son present  Therapeutic activity Pt and son present for discussion and education on DC destination, DME, and AD. Pt son bought pt a quad cane and OT educates that per PT, pt is not ready for this AD, however will be progressed appropriately through [place]. OT educated on energy conservation and recommended walker tray for pt to be able to transport items for self as pts daughter in law will be working during the day, but home to supervise as needed. Pt very eager to go home but able to state she needs some assistance initially. Pt's son, Al, exits and pt and OT practice shower transfer using posterior method with RW. Pt educated on placing shower chair in position so pt is able ot sit into chair and use a HH shower head. Pt verbalized udnerstanding of needing someone present initially for bathing/shower transfers. Edu re use of terry cloth robe for energy conservation as well as non skid footwear out of bathroom after bathing.  Pt left at end of session in w/c with exit alarm on, call light in reach and all needs met   Therapy Documentation Precautions:  Precautions Precautions: Fall Precaution Comments: mild L hemi, wound on L  knee Restrictions Weight Bearing Restrictions: No General:   Vital Signs:   Pain:     Therapy/Group: Individual Therapy  Tonny Branch 07/13/2021, 6:53 AM

## 2021-07-13 NOTE — Progress Notes (Signed)
Physical Therapy Session Note  Patient Details  Name: Laura Mendez MRN: [id number] Date of Birth: 1940/11/05  Today's Date: 07/13/2021 PT Individual Time: 0900-1000 PT Individual Time Calculation (min): 60 min   Short Term Goals: Week 1:  PT Short Term Goal 1 (Week 1): pt will perform bed mobility with supeivsion assist PT Short Term Goal 1 - Progress (Week 1): Met PT Short Term Goal 2 (Week 1): Pt will consistenly perform stand pivot trasnfers with RW nad CGA PT Short Term Goal 2 - Progress (Week 1): Met PT Short Term Goal 3 (Week 1): Pt will ambulate 45f with CGA and LRAD PT Short Term Goal 3 - Progress (Week 1): Met PT Short Term Goal 4 (Week 1): Pt will ascend 4 steps with min assist and 1 rail PT Short Term Goal 4 - Progress (Week 1): Met Week 2:  PT Short Term Goal 1 (Week 2): STG=LTG due to elos  Skilled Therapeutic Interventions/Progress Updates:  Patient semireclined in bed on entrance to room. Patient alert and agreeable to PT session.   Patient with mild pain complaint at L knee throughout session when WB to LLE.  Therapeutic Activity: Bed Mobility: Patient performed supine --> sit with supervision and use of bedrail. No cueing required for technique. While seated EOB, pt is able to doff gown, don shirt and underwear with supervision, wash UB and face with washcloth as well as brush teeth with SETUP, dons pants with minA d/t grip socks providing resistance. MaxA for socks and shoes d/t L knee pain. Transfers: Patient performed sit<>stand and stand pivot transfers throughout session with supervision with ability to complete on first attempt. MinA required from low height bed, but pt relates that her bed is on risers at home. Provided min verbal cues for forward lean.  Toilet transfer performed x3 with close supervision and pt completing pericare with SETUP and clothing mgmt with close supervision.   Car transfer performed at estimated tall height of son's SUV as pt  believes she may d/c home from hospital in this vehicle. Pt able to use RW for transfer with close supervision, assists LLE into footwell of car using BUE and completes transfer with close supervision requiring vc for technique. Same for exiting vehicle and return to w/c.   Gait Training:  Patient ambulated 125' x2 using RW with close supervision. Demonstrated increased WB into UE during stance phase on LLE. Provided vc/ tc for upright posture.  Patient seated  in w/c at end of session with brakes locked, belt alarm set, and all needs within reach.     Therapy Documentation Precautions:  Precautions Precautions: Fall Precaution Comments: mild L hemi, wound on L knee Restrictions Weight Bearing Restrictions: No General:   Vital Signs:   Pain:  Mild pain complaint in L knee with weight bearing during session and addressed with repositioning and provided with ice pack following session.   Therapy/Group: Individual Therapy  JAlger SimonsPT, DPT 07/13/2021, 8:29 AM

## 2021-07-13 NOTE — Discharge Summary (Signed)
Physician Discharge Summary  Patient ID: Laura Mendez MRN: [id number] DOB/AGE: November 02, 1940 81 y.o.  Admit date: 07/01/2021 Discharge date: 07/15/2021  Discharge Diagnoses:  Principal Problem:   Embolic cerebral infarction [facility]) DVT prophylaxis Soft tissue hematoma patella bursa Hyperlipidemia History of breast cancer COPD/asthma Atrial fibrillation Diastolic congestive heart failure Overweight/BMI 28.82  Discharged Condition: Stable  Significant Diagnostic Studies: CT ANGIO HEAD NECK W WO CM  Result Date: 06/28/2021 CLINICAL DATA:  Provided history: Neuro deficit, acute, stroke suspected. Additional history provided: Fall, left arm weakness, slurred speech. EXAM: CT ANGIOGRAPHY HEAD AND NECK TECHNIQUE: Multidetector CT imaging of the head and neck was performed using the standard protocol during bolus administration of intravenous contrast. Multiplanar CT image reconstructions and MIPs were obtained to evaluate the vascular anatomy. Carotid stenosis measurements (when applicable) are obtained utilizing NASCET criteria, using the distal internal carotid diameter as the denominator. RADIATION DOSE REDUCTION: This exam was performed according to the departmental dose-optimization program which includes automated exposure control, adjustment of the mA and/or kV according to patient size and/or use of iterative reconstruction technique. CONTRAST:  31mL OMNIPAQUE IOHEXOL 350 MG/ML SOLN COMPARISON:  No pertinent prior exams available for comparison. FINDINGS: CT HEAD FINDINGS Brain: Mild generalized cerebral and cerebellar atrophy. 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 There is no acute intracranial hemorrhage. No demarcated cortical infarct. No extra-axial fluid collection. No evidence of an intracranial mass. No midline shift. Vascular: No hyperdense vessel.  Atherosclerotic calcifications. Skull: Normal. Negative for fracture or focal lesion. Sinuses: Minimal scattered  paranasal sinus mucosal thickening. Orbits: No orbital mass or acute orbital finding. Review of the MIP images confirms the above findings CTA NECK FINDINGS Aortic arch: The left vertebral artery arises directly from the aortic arch. Atherosclerotic plaque within the visualized aortic arch and proximal major branch vessels of the neck. Streak and beam hardening artifact arising from a dense left-sided contrast bolus partially obscures the left subclavian artery. Within this limitation, there is no appreciable hemodynamically significant innominate or proximal subclavian artery stenosis. Right carotid system: CCA and ICA patent within the neck without significant stenosis (50% or greater). Mild atherosclerotic plaque a within the proximal ICA. Left carotid system: CCA and ICA patent within the neck without significant stenosis (50% or greater). Mild atherosclerotic plaque about the carotid bifurcation and within the proximal ICA. Vertebral arteries: Vertebral arteries patent within the neck without significant stenosis. Right vertebral artery dominant. Skeleton: Cervical spine separately reported on concurrently performed CT of the cervical spine. Elsewhere, no acute bony abnormality is identified. Other neck: No neck mass or cervical lymphadenopathy. Upper chest: No consolidation within the imaged lung apices. Centrilobular emphysema. Review of the MIP images confirms the above findings CTA HEAD FINDINGS Anterior circulation: The intracranial internal carotid arteries are patent. Nonstenotic calcified plaque within both vessels. The M1 middle cerebral arteries are patent. Atherosclerotic irregularity of the M2 and more distal MCA vessels, bilaterally. No M2 proximal branch occlusion or high-grade proximal stenosis is identified. The anterior cerebral arteries are patent. No intracranial aneurysm is identified. Posterior circulation: The non dominant left vertebral artery terminates predominantly as the left PICA.  The dominant intracranial right vertebral artery is patent without stenosis. The basilar artery is patent. The posterior cerebral arteries are patent. Venous sinuses: Within the limitations of contrast timing, no convincing thrombus. Anatomic variants: Posterior communicating arteries are present bilaterally. Review of the MIP images confirms the above findings IMPRESSION: CT head: 1. No evidence of acute intracranial abnormality. 2. Chronic lacunar infarct within  the right caudate head. 3. Mild generalized cerebral and cerebellar atrophy. CTA neck: 1. The common carotid, internal carotid and vertebral arteries are patent within the neck without hemodynamically significant stenosis. Mild atherosclerotic plaque within the bilateral carotid systems within the neck, as described. 2. Aortic Atherosclerosis (ICD10-I70.0) and Emphysema (ICD10-J43.9). CTA head: 1. No intracranial large vessel occlusion or proximal high-grade arterial stenosis identified. 2. Nonstenotic atherosclerotic plaque within the intracranial ICAs. 3. Atherosclerotic irregularity of the M2 and more distal MCA vessels, bilaterally. Electronically Signed   By: Kellie Simmering D.O.   On: 06/28/2021 13:25   DG Chest 2 View  Result Date: 06/26/2021 CLINICAL DATA:  Burning in chest, asthma EXAM: CHEST - 2 VIEW COMPARISON:  11/27/2020 FINDINGS: Linear densities in the lung bases could reflect atelectasis or scarring. Heart is normal size. No effusions or acute bony abnormalities. IMPRESSION: Bibasilar scarring or atelectasis. Electronically Signed   By: Rolm Baptise M.D.   On: 06/26/2021 20:05   DG Forearm Left  Result Date: 06/28/2021 CLINICAL DATA:  Status post fall, left forearm pain EXAM: LEFT FOREARM - 2 VIEW COMPARISON:  None. FINDINGS: No acute fracture or dislocation. No aggressive osseous lesion. Normal alignment. Soft tissue are unremarkable. No radiopaque foreign body or soft tissue emphysema. IMPRESSION: 1.  No acute osseous injury of the  left forearm. Electronically Signed   By: Kathreen Devoid M.D.   On: 06/28/2021 12:52   CT Knee Left Wo Contrast  Result Date: 06/28/2021 CLINICAL DATA:  Evaluate for hemarthrosis. Bruising and swelling to LEFT knee. EXAM: CT OF THE  KNEE WITHOUT CONTRAST TECHNIQUE: Multidetector CT imaging of the knee was performed according to the standard protocol. Multiplanar CT image reconstructions were also generated. RADIATION DOSE REDUCTION: This exam was performed according to the departmental dose-optimization program which includes automated exposure control, adjustment of the mA and/or kV according to patient size and/or use of iterative reconstruction technique. COMPARISON:  None. FINDINGS: No osseous fracture line or displaced fracture fragment seen. Tricompartmental degenerative osteoarthritic changes, with joint space narrowings and osseous spurring, most pronounced at the lateral compartment. Small joint effusion, mostly localized to the suprapatellar fossa, without layering density to suggest hemarthrosis. Pronounced masslike thickening within the subcutaneous soft tissues overlying the knee (series 4, images 55 through 98), measuring up to 2.5 cm thickness (series 4, image 67) most pronounced anteriorly and medially. This could represent a soft tissue mass or phlegmonous-like infectious collection. Additional ill-defined fluid stranding throughout the subcutaneous soft tissues about the knee and proximal tib-fib, suggesting underlying cellulitis. IMPRESSION: 1. Pronounced masslike thickening within the subcutaneous soft tissues overlying the knee, measuring up to 2.5 cm thickness, most pronounced anteriorly and medially. This could represent a soft tissue mass or phlegmonous-like infectious collection. If any recent injury, this could represent soft tissue hematoma. 2. Additional ill-defined fluid stranding throughout the subcutaneous soft tissues about the knee and proximal tib-fib, suggesting underlying  cellulitis. 3. Small joint effusion, mostly localized to the suprapatellar fossa, without layering density to suggest hemarthrosis. 4. Tricompartmental degenerative osteoarthritic changes, most pronounced at the lateral compartment. 5. No acute-appearing osseous abnormality. Electronically Signed   By: Franki Cabot M.D.   On: 06/28/2021 19:23   MR ANGIO HEAD WO CONTRAST  Result Date: 06/28/2021 CLINICAL DATA:  Mental status change, unknown cause. EXAM: MRI HEAD WITHOUT CONTRAST MRA HEAD WITHOUT CONTRAST MRA NECK WITHOUT CONTRAST TECHNIQUE: Multiplanar, multi-echo pulse sequences of the brain and surrounding structures were acquired without intravenous contrast. Angiographic images of the Circle of Willis  were acquired using MRA technique without intravenous contrast. Angiographic images of the neck were acquired using MRA technique without intravenous contrast. Carotid stenosis measurements (when applicable) are obtained utilizing NASCET criteria, using the distal internal carotid diameter as the denominator. COMPARISON:  Noncontrast head CT and CT angiogram head/neck performed earlier today 06/28/2021. FINDINGS: MRI HEAD FINDINGS Brain: Mild intermittent motion degradation. Mild generalized cerebral and cerebellar atrophy. There are fairly numerous scattered subcentimeter acute infarcts within the bilateral cerebral hemispheres, right basal ganglia, posterior limb of right internal capsule, left thalamus and within the bilateral cerebellar hemispheres. Involvement of multiple vascular territories raises suspicion for an embolic process. Background minimal multifocal T2 FLAIR hyperintense signal abnormality within the cerebral white matter, nonspecific but compatible with chronic small vessel ischemic disease. 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 There are a few nonspecific supratentorial chronic parenchymal microhemorrhages. No evidence of an intracranial mass. No  extra-axial fluid collection. No midline shift. Vascular: Maintained flow voids within the proximal large arterial vessels. Skull and upper cervical spine: No focal suspicious marrow lesion. Sinuses/Orbits: Visualized orbits show no acute finding. Bilateral ocular lens replacements. Mild mucosal thickening within the left frontal sinus and within a posterior left ethmoid air cell. MRA HEAD FINDINGS Mildly motion degraded exam. Anterior circulation: The intracranial internal carotid arteries are patent. Minimal atherosclerotic irregularity of both vessels. The M1 middle cerebral arteries are patent. No M2 proximal branch occlusion or high-grade proximal stenosis is identified. Mild atherosclerotic irregularity of the M2 and more distal MCA vessels, bilaterally. The anterior cerebral arteries are patent. No intracranial aneurysm is identified. Posterior circulation: The intracranial vertebral arteries are patent. The non dominant left vertebral artery is developmentally diminutive beyond the origin of the left PICA. The basilar artery is patent. The posterior cerebral arteries are patent. Posterior communicating arteries are present bilaterally. Anatomic variants: None significant. MRA NECK FINDINGS Aortic arch: Motion degradation and noncontrast technique limits evaluation of the aortic arch and origins of the great vessels of the neck. Right carotid system: Within described limitations, the CCA and ICA are patent within the neck without hemodynamically significant stenosis (50% or greater). Mild atherosclerotic plaque within the proximal ICA. Left carotid system: Within described limitations, the CCA and ICA are patent within the neck without hemodynamically significant stenosis. Mild atherosclerotic plaque within the carotid bifurcation and proximal ICA. Vertebral arteries: Within described limitations, the vertebral arteries are patent within the neck without hemodynamically significant stenosis. The right  vertebral artery is dominant. IMPRESSION: MRI brain: 1. Mildly motion degraded exam. 2. Fairly numerous subcentimeter acute infarcts within the bilateral cerebral hemispheres, right basal ganglia, posterior limb of right internal capsule, left thalamus and within the bilateral cerebellar hemispheres. Involvement of multiple vascular territories raises suspicion for an embolic process. 3. Background minimal chronic small-vessel ischemic changes within the cerebral white matter. 4. 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 5. Mild generalized parenchymal atrophy. 6. Mild paranasal sinus disease, as described. MRA head: 1. No intracranial large vessel occlusion or proximal high-grade arterial stenosis. 2. Intracranial atherosclerotic disease, as described. MRA neck: 1. Motion degradation and non-contrast technique limits evaluation of the aortic arch and proximal major branch vessels of the neck. 2. Within this limitation, the common carotid, internal carotid and vertebral arteries are patent within neck without hemodynamically significant stenosis. Mild atherosclerotic plaque within the bilateral carotid systems within the neck, as described. Electronically Signed   By: Kellie Simmering D.O.   On: 06/28/2021 18:07  MR ANGIO NECK WO CONTRAST  Result Date: 06/28/2021 CLINICAL DATA:  Mental status change, unknown cause. EXAM: MRI HEAD WITHOUT CONTRAST MRA HEAD WITHOUT CONTRAST MRA NECK WITHOUT CONTRAST TECHNIQUE: Multiplanar, multi-echo pulse sequences of the brain and surrounding structures were acquired without intravenous contrast. Angiographic images of the Circle of Willis were acquired using MRA technique without intravenous contrast. Angiographic images of the neck were acquired using MRA technique without intravenous contrast. Carotid stenosis measurements (when applicable) are obtained utilizing NASCET criteria, using the distal internal carotid diameter as the denominator.  COMPARISON:  Noncontrast head CT and CT angiogram head/neck performed earlier today 06/28/2021. FINDINGS: MRI HEAD FINDINGS Brain: Mild intermittent motion degradation. Mild generalized cerebral and cerebellar atrophy. There are fairly numerous scattered subcentimeter acute infarcts within the bilateral cerebral hemispheres, right basal ganglia, posterior limb of right internal capsule, left thalamus and within the bilateral cerebellar hemispheres. Involvement of multiple vascular territories raises suspicion for an embolic process. Background minimal multifocal T2 FLAIR hyperintense signal abnormality within the cerebral white matter, nonspecific but compatible with chronic small vessel ischemic disease. 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 There are a few nonspecific supratentorial chronic parenchymal microhemorrhages. No evidence of an intracranial mass. No extra-axial fluid collection. No midline shift. Vascular: Maintained flow voids within the proximal large arterial vessels. Skull and upper cervical spine: No focal suspicious marrow lesion. Sinuses/Orbits: Visualized orbits show no acute finding. Bilateral ocular lens replacements. Mild mucosal thickening within the left frontal sinus and within a posterior left ethmoid air cell. MRA HEAD FINDINGS Mildly motion degraded exam. Anterior circulation: The intracranial internal carotid arteries are patent. Minimal atherosclerotic irregularity of both vessels. The M1 middle cerebral arteries are patent. No M2 proximal branch occlusion or high-grade proximal stenosis is identified. Mild atherosclerotic irregularity of the M2 and more distal MCA vessels, bilaterally. The anterior cerebral arteries are patent. No intracranial aneurysm is identified. Posterior circulation: The intracranial vertebral arteries are patent. The non dominant left vertebral artery is developmentally diminutive beyond the origin of the left PICA. The  basilar artery is patent. The posterior cerebral arteries are patent. Posterior communicating arteries are present bilaterally. Anatomic variants: None significant. MRA NECK FINDINGS Aortic arch: Motion degradation and noncontrast technique limits evaluation of the aortic arch and origins of the great vessels of the neck. Right carotid system: Within described limitations, the CCA and ICA are patent within the neck without hemodynamically significant stenosis (50% or greater). Mild atherosclerotic plaque within the proximal ICA. Left carotid system: Within described limitations, the CCA and ICA are patent within the neck without hemodynamically significant stenosis. Mild atherosclerotic plaque within the carotid bifurcation and proximal ICA. Vertebral arteries: Within described limitations, the vertebral arteries are patent within the neck without hemodynamically significant stenosis. The right vertebral artery is dominant. IMPRESSION: MRI brain: 1. Mildly motion degraded exam. 2. Fairly numerous subcentimeter acute infarcts within the bilateral cerebral hemispheres, right basal ganglia, posterior limb of right internal capsule, left thalamus and within the bilateral cerebellar hemispheres. Involvement of multiple vascular territories raises suspicion for an embolic process. 3. Background minimal chronic small-vessel ischemic changes within the cerebral white matter. 4. 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 5. Mild generalized parenchymal atrophy. 6. Mild paranasal sinus disease, as described. MRA head: 1. No intracranial large vessel occlusion or proximal high-grade arterial stenosis. 2. Intracranial atherosclerotic disease, as described. MRA neck: 1. Motion degradation and non-contrast technique limits evaluation of the aortic arch and proximal major branch  vessels of the neck. 2. Within this limitation, the common carotid, internal carotid and vertebral arteries are patent  within neck without hemodynamically significant stenosis. Mild atherosclerotic plaque within the bilateral carotid systems within the neck, as described. Electronically Signed   By: Kellie Simmering D.O.   On: 06/28/2021 18:07   MR BRAIN WO CONTRAST  Result Date: 06/28/2021 CLINICAL DATA:  Mental status change, unknown cause. EXAM: MRI HEAD WITHOUT CONTRAST MRA HEAD WITHOUT CONTRAST MRA NECK WITHOUT CONTRAST TECHNIQUE: Multiplanar, multi-echo pulse sequences of the brain and surrounding structures were acquired without intravenous contrast. Angiographic images of the Circle of Willis were acquired using MRA technique without intravenous contrast. Angiographic images of the neck were acquired using MRA technique without intravenous contrast. Carotid stenosis measurements (when applicable) are obtained utilizing NASCET criteria, using the distal internal carotid diameter as the denominator. COMPARISON:  Noncontrast head CT and CT angiogram head/neck performed earlier today 06/28/2021. FINDINGS: MRI HEAD FINDINGS Brain: Mild intermittent motion degradation. Mild generalized cerebral and cerebellar atrophy. There are fairly numerous scattered subcentimeter acute infarcts within the bilateral cerebral hemispheres, right basal ganglia, posterior limb of right internal capsule, left thalamus and within the bilateral cerebellar hemispheres. Involvement of multiple vascular territories raises suspicion for an embolic process. Background minimal multifocal T2 FLAIR hyperintense signal abnormality within the cerebral white matter, nonspecific but compatible with chronic small vessel ischemic disease. 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 There are a few nonspecific supratentorial chronic parenchymal microhemorrhages. No evidence of an intracranial mass. No extra-axial fluid collection. No midline shift. Vascular: Maintained flow voids within the proximal large arterial vessels. Skull and  upper cervical spine: No focal suspicious marrow lesion. Sinuses/Orbits: Visualized orbits show no acute finding. Bilateral ocular lens replacements. Mild mucosal thickening within the left frontal sinus and within a posterior left ethmoid air cell. MRA HEAD FINDINGS Mildly motion degraded exam. Anterior circulation: The intracranial internal carotid arteries are patent. Minimal atherosclerotic irregularity of both vessels. The M1 middle cerebral arteries are patent. No M2 proximal branch occlusion or high-grade proximal stenosis is identified. Mild atherosclerotic irregularity of the M2 and more distal MCA vessels, bilaterally. The anterior cerebral arteries are patent. No intracranial aneurysm is identified. Posterior circulation: The intracranial vertebral arteries are patent. The non dominant left vertebral artery is developmentally diminutive beyond the origin of the left PICA. The basilar artery is patent. The posterior cerebral arteries are patent. Posterior communicating arteries are present bilaterally. Anatomic variants: None significant. MRA NECK FINDINGS Aortic arch: Motion degradation and noncontrast technique limits evaluation of the aortic arch and origins of the great vessels of the neck. Right carotid system: Within described limitations, the CCA and ICA are patent within the neck without hemodynamically significant stenosis (50% or greater). Mild atherosclerotic plaque within the proximal ICA. Left carotid system: Within described limitations, the CCA and ICA are patent within the neck without hemodynamically significant stenosis. Mild atherosclerotic plaque within the carotid bifurcation and proximal ICA. Vertebral arteries: Within described limitations, the vertebral arteries are patent within the neck without hemodynamically significant stenosis. The right vertebral artery is dominant. IMPRESSION: MRI brain: 1. Mildly motion degraded exam. 2. Fairly numerous subcentimeter acute infarcts within  the bilateral cerebral hemispheres, right basal ganglia, posterior limb of right internal capsule, left thalamus and within the bilateral cerebellar hemispheres. Involvement of multiple vascular territories raises suspicion for an embolic process. 3. Background minimal chronic small-vessel ischemic changes within the cerebral white matter. 4. Chronic lacunar infarcts within the right (and  possibly left) caudate nuclei. 5. Mild generalized parenchymal atrophy. 6. Mild paranasal sinus disease, as described. MRA head: 1. No intracranial large vessel occlusion or proximal high-grade arterial stenosis. 2. Intracranial atherosclerotic disease, as described. MRA neck: 1. Motion degradation and non-contrast technique limits evaluation of the aortic arch and proximal major branch vessels of the neck. 2. Within this limitation, the common carotid, internal carotid and vertebral arteries are patent within neck without hemodynamically significant stenosis. Mild atherosclerotic plaque within the bilateral carotid systems within the neck, as described. Electronically Signed   By: Kellie Simmering D.O.   On: 06/28/2021 18:07   [place] COMPLETE JOINT SPACE STRUCTURES LOW LEFT  Result Date: 07/08/2021 CLINICAL DATA:  Left knee pain, fell 10 days ago EXAM: LEFT LOWER EXTREMITY SOFT TISSUE ULTRASOUND COMPLETE TECHNIQUE: Ultrasound examination was performed including evaluation of the muscles, tendons, joint, and adjacent soft tissues. COMPARISON:  06/28/2021 FINDINGS: Sonographic evaluation of the palpable area anterior to the left patella was performed, in the region of soft tissue thickening on recent CT. In the subcutaneous tissues there is an 8.3 by 6.2 x 2.2 cm complex hypoechoic fluid collection, with numerous internal septations. Given history of recent trauma, this is most consistent with subcutaneous hematoma. No internal vascularity. IMPRESSION: 1. Complex subcutaneous fluid collection overlying the left patella, most consistent  with subcutaneous hematoma given history of recent trauma. This corresponds to previous CT findings. Electronically Signed   By: Randa Ngo M.D.   On: 07/08/2021 16:04   CT C-SPINE NO CHARGE  Result Date: 06/28/2021 CLINICAL DATA:  81 year old female with fall. EXAM: CT CERVICAL SPINE WITHOUT CONTRAST TECHNIQUE: Multidetector CT imaging of the cervical spine was performed without intravenous contrast. Multiplanar CT image reconstructions were also generated. RADIATION DOSE REDUCTION: This exam was performed according to the departmental dose-optimization program which includes automated exposure control, adjustment of the mA and/or kV according to patient size and/or use of iterative reconstruction technique. COMPARISON:  None. FINDINGS: Alignment: Normal. Skull base and vertebrae: No acute fracture. No primary bone lesion or focal pathologic process. Soft tissues and spinal canal: No prevertebral fluid or swelling. No visible canal hematoma. Disc levels: Mild multilevel degenerative disc disease/spondylosis and mild to moderate multilevel facet arthropathy noted. These findings contribute to mild bony RIGHT foraminal narrowing at C3-4. Upper chest: No acute abnormality Other: None IMPRESSION: 1. No static evidence of acute injury to the cervical spine. 2. Mild multilevel degenerative disc disease/spondylosis and mild to moderate multilevel facet arthropathy. CTA head/neck is dictated in a separate report. Electronically Signed   By: Margarette Canada M.D.   On: 06/28/2021 13:25   DG Chest Portable 1 View  Result Date: 06/28/2021 CLINICAL DATA:  Fall EXAM: PORTABLE CHEST 1 VIEW COMPARISON:  None. FINDINGS: Lungs are clear. No pneumothorax or pleural effusion. Cardiac size is mildly enlarged. Prominent calcification of the mitral valve annulus noted. Rounded opacity within the right perihilar region represents vascular shadow, better assessed on prior CT examination of 07/28/2006. Pulmonary vascularity is  otherwise normal. No acute bone abnormality. IMPRESSION: No radiographic evidence of acute cardiopulmonary disease. Mild cardiomegaly Electronically Signed   By: Fidela Salisbury M.D.   On: 06/28/2021 19:41   DG Knee Complete 4 Views Left  Result Date: 06/28/2021 CLINICAL DATA:  Status post fall.  Left arm pain.  Left knee pain. EXAM: LEFT KNEE - COMPLETE 4+ VIEW COMPARISON:  None. FINDINGS: No acute fracture or dislocation. No aggressive osseous lesion. Normal alignment. Generalized osteopenia. Moderate left lateral femorotibial compartment joint  space narrowing with marginal osteophytes. Mild medial femorotibial compartment joint space narrowing marginal osteophytes. Severe soft tissue swelling along the anterior aspect of the knee. No radiopaque foreign body or soft tissue emphysema. IMPRESSION: 1. No acute osseous injury of the left knee. 2. Severe soft tissue swelling along the anterior aspect of the knee consistent with soft tissue contusion. Electronically Signed   By: Kathreen Devoid M.D.   On: 06/28/2021 12:53   ECHOCARDIOGRAM COMPLETE  Result Date: 06/29/2021    ECHOCARDIOGRAM REPORT   Patient Name:   Laura Mendez Date of Exam: 06/29/2021 Medical Rec #:  [id number]              Height:       71.0 in Accession #:    [id number]             Weight:       195.0 lb Date of Birth:  06/14/1940               BSA:          2.086 m Patient Age:    54 years               BP:           113/98 mmHg Patient Gender: F                      HR:           72 bpm. Exam Location:  Inpatient Procedure: 2D Echo, Cardiac Doppler and Color Doppler Indications:    Stroke I63.9  History:        Patient has prior history of Echocardiogram examinations, most                 recent 12/29/2015. CHF, COPD, Stroke and TIA; Arrythmias:Atrial                 Fibrillation and Atrial Flutter.  Sonographer:    Darlina Sicilian RDCS Referring Phys: [place]  1. Left ventricular ejection fraction, by estimation, is  60 to 65%. The left ventricle has normal function. The left ventricle has no regional wall motion abnormalities. The left ventricular internal cavity size was mildly dilated. Left ventricular diastolic parameters are consistent with Grade I diastolic dysfunction (impaired relaxation).  2. Right ventricular systolic function is normal. The right ventricular size is normal. There is normal pulmonary artery systolic pressure.  3. Left atrial size was moderately dilated.  4. The mitral valve is normal in structure. No evidence of mitral valve regurgitation. No evidence of mitral stenosis. Moderate mitral annular calcification.  5. The aortic valve is normal in structure. Aortic valve regurgitation is not visualized. No aortic stenosis is present.  6. The inferior vena cava is normal in size with greater than 50% respiratory variability, suggesting right atrial pressure of 3 mmHg. Conclusion(s)/Recommendation(s): No intracardiac source of embolism detected on this transthoracic study. Consider a transesophageal echocardiogram to exclude cardiac source of embolism if clinically indicated. FINDINGS  Left Ventricle: Left ventricular ejection fraction, by estimation, is 60 to 65%. The left ventricle has normal function. The left ventricle has no regional wall motion abnormalities. The left ventricular internal cavity size was mildly dilated. There is  no left ventricular hypertrophy. Left ventricular diastolic parameters are consistent with Grade I diastolic dysfunction (impaired relaxation). Right Ventricle: The right ventricular size is normal. No increase in right ventricular wall thickness. Right ventricular systolic function is normal. There is  normal pulmonary artery systolic pressure. The tricuspid regurgitant velocity is 2.37 m/s, and  with an assumed right atrial pressure of 8 mmHg, the estimated right ventricular systolic pressure is 32.4 mmHg. Left Atrium: Left atrial size was moderately dilated. Right Atrium:  Right atrial size was normal in size. Pericardium: There is no evidence of pericardial effusion. Mitral Valve: The mitral valve is normal in structure. There is moderate thickening of the mitral valve leaflet(s). There is moderate calcification of the mitral valve leaflet(s). Moderate mitral annular calcification. No evidence of mitral valve regurgitation. No evidence of mitral valve stenosis. MV peak gradient, 11.7 mmHg. The mean mitral valve gradient is 4.0 mmHg. Tricuspid Valve: The tricuspid valve is normal in structure. Tricuspid valve regurgitation is mild . No evidence of tricuspid stenosis. Aortic Valve: The aortic valve is normal in structure. Aortic valve regurgitation is not visualized. No aortic stenosis is present. Pulmonic Valve: The pulmonic valve was normal in structure. Pulmonic valve regurgitation is not visualized. No evidence of pulmonic stenosis. Aorta: The aortic root is normal in size and structure. Venous: The inferior vena cava is normal in size with greater than 50% respiratory variability, suggesting right atrial pressure of 3 mmHg. IAS/Shunts: No atrial level shunt detected by color flow Doppler.  LEFT VENTRICLE PLAX 2D LVIDd:         5.60 cm   Diastology LVIDs:         3.10 cm   LV e' medial:    4.21 cm/s LV PW:         1.00 cm   LV E/e' medial:  31.4 LV IVS:        0.90 cm   LV e' lateral:   6.13 cm/s LVOT diam:     2.00 cm   LV E/e' lateral: 21.5 LV SV:         74 LV SV Index:   36 LVOT Area:     3.14 cm  RIGHT VENTRICLE RV S prime:     18.30 cm/s TAPSE (M-mode): 1.9 cm LEFT ATRIUM             Index        RIGHT ATRIUM           Index LA diam:        5.00 cm 2.40 cm/m   RA Area:     15.90 cm LA Vol (A2C):   93.1 ml 44.63 ml/m  RA Volume:   37.50 ml  17.98 ml/m LA Vol (A4C):   85.9 ml 41.18 ml/m LA Biplane Vol: 89.7 ml 43.00 ml/m  AORTIC VALVE LVOT Vmax:   162.00 cm/s LVOT Vmean:  81.300 cm/s LVOT VTI:    0.236 m  AORTA Ao Root diam: 2.90 cm Ao Asc diam:  3.10 cm MITRAL VALVE                 TRICUSPID VALVE MV Area (PHT): 1.82 cm     TR Peak grad:   22.5 mmHg MV Area VTI:   2.10 cm     TR Vmax:        237.00 cm/s MV Peak grad:  11.7 mmHg MV Mean grad:  4.0 mmHg     SHUNTS MV Vmax:       1.71 m/s     Systemic VTI:  0.24 m MV Vmean:      101.0 cm/s   Systemic Diam: 2.00 cm MV Decel Time: 416 msec MV E velocity: 132.00 cm/s MV A velocity: 169.00  cm/s MV E/A ratio:  0.78 Candee Furbish MD Electronically signed by Candee Furbish MD Signature Date/Time: 06/29/2021/3:32:31 PM    Final     Labs:  Basic Metabolic Panel: No results for input(s): NA, K, CL, CO2, GLUCOSE, BUN, CREATININE, CALCIUM, MG, PHOS in the last 168 hours.  CBC: Recent Labs  Lab 07/10/21 0544  WBC 6.5  HGB 11.8*  HCT 37.5  MCV 91.7  PLT 273    CBG: Recent Labs  Lab 07/13/21 0601  GLUCAP 104*    Family history.  Father with COPD.  Mother with colon cancer Sister with lung cancer and ovarian cancer.  Paternal grandfather with rectal cancer.  Negative for breast cancer.  Brief HPI:   Laura Mendez is a 81 y.o. right-handed female with history of TIA, atrial fibrillation maintained on Xarelto followed by Dr. Marlou Porch, COPD followed by Dr. Annamaria Boots, diastolic congestive heart failure, remote right breast cancer with lumpectomy radioactive seed localization 2019 followed by Dr.Gudena, quit smoking 44 years ago.  Per chart review lives alone independent prior to admission.  Presented 06/28/2021 after a fall while walking from the foot of her bed striking her left knee with noted left-sided weakness and slurred speech.  MRI showed acute infarction within bilateral cerebral hemispheres right basal ganglia posterior limb of right internal capsule left thalamus and within the bilateral cerebellar hemisphere.  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  MRA of the head showed no large vessel occlusion or high-grade stenosis.  MRA of the neck without hemodynamically significant stenosis.  CT of the left knee shows  small joint effusion as well as tricompartmental degenerative osteoarthritis changes mostly pronounced at the lateral compartment.  Admission chemistries unremarkable urine drug screen negative.  Echocardiogram with ejection fraction of 60 to 65% no wall motion abnormalities grade 1 diastolic dysfunction.  Neurology follow-up transition from Xarelto to Eliquis.  Therapy evaluations completed due to patient decreased functional mobility was admitted for a comprehensive rehab program.   Hospital Course: Laura Mendez was admitted to rehab 07/01/2021 for inpatient therapies to consist of PT, ST and OT at least three hours five days a week. Past admission physiatrist, therapy team and rehab RN have worked together to provide customized collaborative inpatient rehab.  Pertaining to patient's acute infarct bilateral cerebral hemispheres, right basal ganglia posterior limb right internal capsule left thalamus/embolic shower likely due to embolism source with atrial fibrillation failed Xarelto patient now on Eliquis and would follow-up neurology service as well as cardiology services.  Noted left knee pain CT left knee showed soft tissue hematoma and blood in patellar bursa noted patient with fall prior to admission striking her knee.  Weightbearing as tolerated no signs of infection.  Lipitor ongoing for hyperlipidemia.  Blood pressure controlled no current antihypertensive medications follow-up outpatient.  History of breast cancer maintained on Femara.  COPD/asthma she quit smoking many years ago followed by Dr. Annamaria Boots oxygen saturations maintained 90%.  Atrial fibrillation cardiac rate controlled continue Eliquis follow-up outpatient cardiology services.  History of diastolic congestive heart failure exhibited no signs of fluid overload.  Patient overweight BMI 28.82 dietary follow-up.   Blood pressures were monitored on TID basis and controlled     Rehab course: During patient's stay in rehab weekly  team conferences were held to monitor patient's progress, set goals and discuss barriers to discharge. At admission, patient required moderate assist 10 feet rolling walker  Physical exam.  Blood pressure 137/68 pulse 63 temperature 97.5 respirations 19 oxygen saturations 97%  room air Constitutional.  No acute distress HEENT Head.  Normocephalic and atraumatic Eyes.  Pupils round and reactive to light no discharge without nystagmus Neck.  Supple nontender no JVD without thyromegaly Cardiac regular rate rhythm any extra sounds or murmur heard Abdomen.  Soft nontender positive bowel sounds without rebound Respiratory effort normal no respiratory distress without wheeze Skin.  Intact Neurologic.  Alert makes eye contact with examiner.  Mild dysarthria fully intelligible follows commands.  3/5 strength left side 4/5 strength right side  He/She  has had improvement in activity tolerance, balance, postural control as well as ability to compensate for deficits. He/She has had improvement in functional use RUE/LUE  and RLE/LLE as well as improvement in awareness.  Supine to sit transfers without assistance.  Stand pivot transfer to wheelchair with distant supervision.  Ambulates 50 feet rolling walker distant supervision.  Stair negotiation ascending and descending supervision assist.  Patient demonstrated improved functional use of left upper extremity dynamic standing balance ability to compensate for deficits.  She can gather her belongings for activities day living and homemaking.  Speech therapy facilitated sessions by readministering the [place] mental status exam.  Patient scored 27/30 with a score of 27 or above considered normal.  Full family teaching completed plan discharged to home       Disposition: Discharge to home    Diet: Regular  Special Instructions: No driving smoking or alcohol  Medications at discharge 1.  Tylenol as needed 2.  Eliquis 5 mg p.o. twice  daily 3.  Lipitor 20 mg p.o. daily 4.  Tums 1 p.o. 3 times daily as needed 5.  Flonase 1 spray each nostril daily 6.  Breo Ellipta 1 puff daily 7.  Femara 2.5 mg p.o. daily 8.  Protonix 40 mg p.o. daily   30-35 minutes were spent completing discharge summary and discharge planning  Discharge Instructions     Ambulatory referral to Neurology   Complete by: As directed    An appointment is requested in approximately: 4 weeks bilateral cerebral hemispheric CVA   Ambulatory referral to Physical Medicine Rehab   Complete by: As directed    Moderate complexity follow-up 1 to 2 weeks bilateral cerebral hemisphere, right basal ganglia posterior limb right internal capsule infarction        Follow-up Information     Kirsteins, Luanna Salk, MD Follow up.   Specialty: Physical Medicine and Rehabilitation Why: Office to call for appointment Contact information: [place] [address] [phone]         Deneise Lever, MD Follow up.   Specialty: Pulmonary Disease Why: Call for appointment Contact information: [place] [address] [phone]         Jerline Pain, MD Follow up.   Specialty: Cardiology Why: Call for appointment Contact information: [address] [address] [phone]         Nicholas Lose, MD Follow up.   Specialty: Hematology and Oncology Why: As needed Contact information: [place] [address] [phone]                 Signed: Lavon Paganini [place] 07/15/2021, 5:15 AM

## 2021-07-13 NOTE — Progress Notes (Signed)
PROGRESS NOTE   Subjective/Complaints: No new complaints this morning Would like to be able to use her nebulizer today It will be easier for her son to pick her up on Thursday morning rather than Wednesday  ROS- +shortness of breath, abd pain, CP, N/V/C/D,   Objective:   No results found. No results for input(s): WBC, HGB, HCT, PLT in the last 72 hours. No results for input(s): NA, K, CL, CO2, GLUCOSE, BUN, CREATININE, CALCIUM in the last 72 hours.  Intake/Output Summary (Last 24 hours) at 07/13/2021 1331 Last data filed at 07/12/2021 1857 Gross per 24 hour  Intake 354 ml  Output --  Net 354 ml        Physical Exam: Vital Signs Blood pressure 135/78, pulse 68, temperature 98.1 F (36.7 C), temperature source Oral, resp. rate 17, height 5\' 10"  (1.778 m), weight 91.1 kg, SpO2 97 %. Gen: no distress, normal appearing HEENT: oral mucosa pink and moist, NCAT Cardio: Reg rate Chest: normal effort, normal rate of breathing Abdomen: Positive bowel sounds, soft nontender to palpation, nondistended  Extremities: No clubbing, cyanosis, or edema Skin: Left pre patellar abrasion healed  Neurologic: Cranial nerves II through XII intact, motor strength is 5/5 in RIght and 4/5 Left deltoid, bicep, tricep, grip, hip flexor, knee extensors, ankle dorsiflexor and plantar flexor Sensory exam normal sensation to light touch and  in bilateral upper and lower extremities  Musculoskeletal: Reduced ROM due to pain LLE , pre patellar swelling a no sig joint effusion , mild tenderness    Assessment/Plan: 1. Functional deficits which require 3+ hours per day of interdisciplinary therapy in a comprehensive inpatient rehab setting. Physiatrist is providing close team supervision and 24 hour management of active medical problems listed below. Physiatrist and rehab team continue to assess barriers to discharge/monitor patient progress toward  functional and medical goals  Care Tool:  Bathing    Body parts bathed by patient: Face, Right arm, Left arm, Chest, Abdomen, Front perineal area, Right upper leg, Left upper leg, Right lower leg, Left lower leg   Body parts bathed by helper: Buttocks     Bathing assist Assist Level: Minimal Assistance - Patient > 75%     Upper Body Dressing/Undressing Upper body dressing   What is the patient wearing?: Hospital gown only    Upper body assist Assist Level: Minimal Assistance - Patient > 75%    Lower Body Dressing/Undressing Lower body dressing      What is the patient wearing?: Hospital gown only     Lower body assist Assist for lower body dressing: Minimal Assistance - Patient > 75%     Toileting Toileting    Toileting assist Assist for toileting: Moderate Assistance - Patient 50 - 74%     Transfers Chair/bed transfer  Transfers assist     Chair/bed transfer assist level: Minimal Assistance - Patient > 75% Chair/bed transfer assistive device: Walker, Clinical biochemist   Ambulation assist      Assist level: Minimal Assistance - Patient > 75% Assistive device: Walker-rolling Max distance: 10ft   Walk 10 feet activity   Assist     Assist level: Minimal Assistance - Patient >  75% Assistive device: Walker-rolling   Walk 50 feet activity   Assist Walk 50 feet with 2 turns activity did not occur: Refused         Walk 150 feet activity   Assist Walk 150 feet activity did not occur: Safety/medical concerns         Walk 10 feet on uneven surface  activity   Assist Walk 10 feet on uneven surfaces activity did not occur: Safety/medical concerns         Wheelchair     Assist Is the patient using a wheelchair?: No Type of Wheelchair: Manual    Wheelchair assist level: Minimal Assistance - Patient > 75% Max wheelchair distance: 150    Wheelchair 50 feet with 2 turns activity    Assist        Assist  Level: Minimal Assistance - Patient > 75%   Wheelchair 150 feet activity     Assist      Assist Level: Minimal Assistance - Patient > 75%   Blood pressure 135/78, pulse 68, temperature 98.1 F (36.7 C), temperature source Oral, resp. rate 17, height 5\' 10"  (1.778 m), weight 91.1 kg, SpO2 97 %.  Medical Problem List and Plan: 1. Functional deficits secondary to acute infarct bilateral cerebral hemispheres, right basal ganglia posterior limb right internal capsule left thalamus/embolic shower likely due to embolism source with A-fib failed Xarelto now on Eliquis               -patient may shower             -ELOS/Goals: 2/22 modI             -Continue CIR- PT, OT and SLP  She says it would be easier for her son to pick her up on Thursday morning- messaged team to let them know this 2.  Antithrombotics: -DVT/anticoagulation:  Pharmaceutical: Other (comment) Eliquis             -antiplatelet therapy: N/A 3. Left knee pain: Recommended applying ice 15 minutes three times per day.Tylenol as needed- reviewed CT L knee, OA, soft tissue hematoma and blood in patellar bursa   Abrasion healing well but has persistent prepatellar swelling + Hemorrhagic effusion, WBAT, no sign of soft tissue infection  4. Mood: Provide emotional support             -antipsychotic agents: N/A 5. Neuropsych: This patient is capable of making decisions on her own behalf. 6. Skin/Wound Care: Routine skin checks 7. Fluids/Electrolytes/Nutrition: Routine in and outs with follow-up chemistries 8.  Hyperlipidemia.  Conitnue Lipitor 9.  not on antihypertensive meds at home , will cont to monitor , normotensive today  Vitals:   07/13/21 0743 07/13/21 1130  BP: 140/73 135/78  Pulse: 67 68  Resp: 16 17  Temp: 97.8 F (36.6 C) 98.1 F (36.7 C)  SpO2: 98% 97%  2/20 controlled off meds  10.  History of breast cancer.  Continue Femara 11.  COPD/asthma.  Monitor oxygen saturations every shift.  Continue nebulizer as  needed.  Follow-up Dr. Annamaria Boots, add flonase for rhinitis  12.  Atrial fibrillation.  Cardiac rate controlled.  Continue Eliquis.  Follow-up Dr. Marlou Porch- rate controlled no BB 13.  Diastolic congestive heart failure.  No signs of fluid overload 14. Overweight: BMI 28.82- provide dietary education    LOS: 12 days A FACE TO FACE EVALUATION WAS PERFORMED  Martha Clan P Durrell Barajas 07/13/2021, 1:31 PM

## 2021-07-14 ENCOUNTER — Other Ambulatory Visit (HOSPITAL_COMMUNITY): Payer: Self-pay

## 2021-07-14 MED ORDER — ALBUTEROL SULFATE HFA 108 (90 BASE) MCG/ACT IN AERS
INHALATION_SPRAY | RESPIRATORY_TRACT | 5 refills | Status: DC
  Filled 2021-07-14: qty 8.5, 25d supply, fill #0

## 2021-07-14 MED ORDER — ACETAMINOPHEN 325 MG PO TABS: 650.0000 mg | ORAL_TABLET | ORAL | Status: DC | PRN

## 2021-07-14 MED ORDER — FLUTICASONE FUROATE-VILANTEROL 200-25 MCG/ACT IN AEPB
1.0000 | INHALATION_SPRAY | Freq: Every day | RESPIRATORY_TRACT | 0 refills | Status: DC
  Filled 2021-07-14: qty 60, 30d supply, fill #0

## 2021-07-14 MED ORDER — PANTOPRAZOLE SODIUM 40 MG PO TBEC
40.0000 mg | DELAYED_RELEASE_TABLET | Freq: Every day | ORAL | 0 refills | Status: DC
  Filled 2021-07-14: qty 30, 30d supply, fill #0

## 2021-07-14 MED ORDER — ATORVASTATIN CALCIUM 20 MG PO TABS
20.0000 mg | ORAL_TABLET | Freq: Every day | ORAL | 0 refills | Status: DC
  Filled 2021-07-14: qty 30, 30d supply, fill #0

## 2021-07-14 MED ORDER — APIXABAN 5 MG PO TABS
5.0000 mg | ORAL_TABLET | Freq: Two times a day (BID) | ORAL | 0 refills | Status: DC
  Filled 2021-07-14: qty 60, 30d supply, fill #0

## 2021-07-14 NOTE — Plan of Care (Signed)
Problem: RH Balance Goal: LTG Patient will maintain dynamic standing with ADLs (OT) Description: LTG:  Patient will maintain dynamic standing balance with assist during activities of daily living (OT)  Outcome: Completed/Met   Problem: Sit to Stand Goal: LTG:  Patient will perform sit to stand in prep for activites of daily living with assistance level (OT) Description: LTG:  Patient will perform sit to stand in prep for activites of daily living with assistance level (OT) Outcome: Completed/Met   Problem: RH Grooming Goal: LTG Patient will perform grooming w/assist,cues/equip (OT) Description: LTG: Patient will perform grooming with assist, with/without cues using equipment (OT) Outcome: Completed/Met   Problem: RH Bathing Goal: LTG Patient will bathe all body parts with assist levels (OT) Description: LTG: Patient will bathe all body parts with assist levels (OT) Outcome: Completed/Met   Problem: RH Dressing Goal: LTG Patient will perform upper body dressing (OT) Description: LTG Patient will perform upper body dressing with assist, with/without cues (OT). Outcome: Completed/Met Goal: LTG Patient will perform lower body dressing w/assist (OT) Description: LTG: Patient will perform lower body dressing with assist, with/without cues in positioning using equipment (OT) Outcome: Completed/Met   Problem: RH Toileting Goal: LTG Patient will perform toileting task (3/3 steps) with assistance level (OT) Description: LTG: Patient will perform toileting task (3/3 steps) with assistance level (OT)  Outcome: Completed/Met   Problem: RH Functional Use of Upper Extremity Goal: LTG Patient will use RT/LT upper extremity as a (OT) Description: LTG: Patient will use right/left upper extremity as a stabilizer/gross assist/diminished/nondominant/dominant level with assist, with/without cues during functional activity (OT) Outcome: Completed/Met   Problem: RH Toilet Transfers Goal: LTG Patient  will perform toilet transfers w/assist (OT) Description: LTG: Patient will perform toilet transfers with assist, with/without cues using equipment (OT) Outcome: Completed/Met   Problem: RH Tub/Shower Transfers Goal: LTG Patient will perform tub/shower transfers w/assist (OT) Description: LTG: Patient will perform tub/shower transfers with assist, with/without cues using equipment (OT) Outcome: Completed/Met   Problem: RH Memory Goal: LTG Patient will demonstrate ability for day to day recall/carry over during activities of daily living with assistance level (OT) Description: LTG:  Patient will demonstrate ability for day to day recall/carry over during activities of daily living with assistance level (OT). Outcome: Completed/Met   Problem: RH Furniture Transfers Goal: LTG Patient will perform furniture transfers w/assist (OT/PT) Description: LTG: Patient will perform furniture transfers  with assistance (OT/PT). Outcome: Completed/Met

## 2021-07-14 NOTE — Progress Notes (Signed)
Inpatient Rehabilitation Discharge Medication Review by a Pharmacist  A complete drug regimen review was completed for this patient to identify any potential clinically significant medication issues.  High Risk Drug Classes Is patient taking? Indication by Medication  Antipsychotic No   Anticoagulant Yes Apixaban- VTE treatment/prophylaxis  Antibiotic No   Opioid No   Antiplatelet No   Hypoglycemics/insulin No   Vasoactive Medication No   Chemotherapy Yes, Oral Chemotherapy and No Femera- Breast CA  Other Yes Nucala, dupixent, albuterol, Wixela- asthma Protonix- GERD Lipitor- HLD     Type of Medication Issue Identified Description of Issue Recommendation(s)  Drug Interaction(s) (clinically significant)     Duplicate Therapy     Allergy     No Medication Administration End Date     Incorrect Dose     Additional Drug Therapy Needed     Significant med changes from prior encounter (inform family/care partners about these prior to discharge).    Other       Clinically significant medication issues were identified that warrant physician communication and completion of prescribed/recommended actions by midnight of the next day:  No   Time spent performing this drug regimen review (minutes):  30   Laura Mendez BS, PharmD, BCPS Clinical Pharmacist 07/14/2021 10:44 AM

## 2021-07-14 NOTE — Progress Notes (Signed)
Occupational Therapy Session Note  Patient Details  Name: Laura Mendez MRN: [id number] Date of Birth: 1940-10-25  Today's Date: 07/14/2021 OT Individual Time: 0700-0800 OT Individual Time Calculation (min): 60 min    Short Term Goals: Week 1:  OT Short Term Goal 1 (Week 1): Pt will complete toilet transfer with min A and LRAD. OT Short Term Goal 1 - Progress (Week 1): Met OT Short Term Goal 2 (Week 1): Pt will complete standing grooming task with min A. OT Short Term Goal 2 - Progress (Week 1): Met OT Short Term Goal 3 (Week 1): Pt will require no more than min cuing for LUE positioning. OT Short Term Goal 3 - Progress (Week 1): Met  Skilled Therapeutic Interventions/Progress Updates:     Pt received in bed with no pain   ADL: Pt completes ADL at overall supervision/set up Level. Skilled interventions include: reinforcement of education to have someone supervise mobility especially during first few days as pt continues to state, "I dont want to be a bother to my family," education to wear non skid footwear whenever mobilizing even out of the shower/bathroom as pt likes to walk barefoot, reinforcement that pt should use the RW until PT clears to begin with quad cane, and review of posterior method of walk in shower transfer. Pt able ot complete all ADL items and mobility with supervision and good carryover of safety strategies. [place] provided for family as stated below.   Provided written handout for family with the following info:  General mobility- they should always use their RW. They may benefit from a walker tray to transport items from room to room if walking with a walker is recommended by physical therapy. The walker should be kept within reach so they can pull it close to get up and keep with them to back up to any surface they want to sit on. When getting up, they should push up from the surface they are getting up from and reach back when sitting to a new surface, no  plopping.  You are their shadow. Especially in the beginning. You, as the helper should be in reach of the patient when mobilizing. You should be either beside or behind them so if they lose their balance you can assist by helping correct at the hips. This is likely closer than you are used to being- be in their personal space. Use a gait belt if that makes you feel more comfortable If you are attempting to get up/transfer and it is not going well, reset. Have them sit back down. Make sure they are close to the edge of the seat, feet are underneath them at hips distance, and they are leaning forward to stand up. Bathing- they should sit to bathe on a shower chair, especially for washing legs/feet. Sitting will save energy and increase safety. For a tub shower with shower chair: Use the walker to walk up to shower/tub edge and leave it to the side, but close. they can use the wall to steady as they step over or a grab bar. Do your best to dry off the floor prior to getting out of the shower For tub shower with Tub bench: use walker to get to the edge of the tub bench, back up to the edge, reach back prior to sitting down. Turn to swing legs into tub and scoot across. Reverse to exit the tub. Make sure both legs are out of the tub prior to standing to exit the bathroom  Walk in shower: walk up to the shower ledge, turn around and back up to the ledge with the walker. Keep both hands-on walker while they step back one foot at a time Dressing- all should be done from a SEATED level, especially to put underwear and pants over feet.  Toileting- the RW can be walked right over the toilet for standing urination if applicable. If seated toileting is more appropriate, have them walk up to the toilet and keep walker with them as they turn to sit to toilet or BSC. Before they stand to pull up pants past hips they should pull pants/underwear up past their knees to decrease the need to bend forward to the floor.  Sometimes this makes people dizzy if incontinence/bathroom accidents are an issue attempt to toilet every 2-3 hours to improve success with toileting and decrease accidents.  Energy conservation principles- Prioritize what needs to be done and what can be moved to another day Plan out their days, weeks, months to spread out taxing (physical or cognitively tiring) activities to not put too much at one time Pace activities- rest before feeling tired and have designated places to rest if they feel tired and need to take a brake Position for success: sit when able to conserve 25% more energy than standing    Pt left at end of session in w/c with exit alarm on, call light in reach and all needs met   Therapy Documentation Precautions:  Precautions Precautions: Fall Precaution Comments: mild L hemi, wound on L knee Restrictions Weight Bearing Restrictions: No General:     Therapy/Group: Individual Therapy  Tonny Branch 07/14/2021, 6:46 AM

## 2021-07-14 NOTE — Progress Notes (Signed)
PROGRESS NOTE   Subjective/Complaints: No issues overnite  ROS- +shortness of breath, abd pain, CP, N/V/C/D,   Objective:   No results found. No results for input(s): WBC, HGB, HCT, PLT in the last 72 hours. No results for input(s): NA, K, CL, CO2, GLUCOSE, BUN, CREATININE, CALCIUM in the last 72 hours.  Intake/Output Summary (Last 24 hours) at 07/14/2021 0757 Last data filed at 07/13/2021 1820 Gross per 24 hour  Intake 480 ml  Output --  Net 480 ml         Physical Exam: Vital Signs Blood pressure 112/72, pulse 61, temperature 98.1 F (36.7 C), temperature source Oral, resp. rate 14, height 5\' 10"  (1.778 m), weight 91.1 kg, SpO2 94 %.    Extremities: No clubbing, cyanosis, or edema Skin: Left pre patellar abrasion healed  Neurologic: Cranial nerves II through XII intact, motor strength is 5/5 in RIght and 4/5 Left deltoid, bicep, tricep, grip, hip flexor, knee extensors, ankle dorsiflexor and plantar flexor Sensory exam normal sensation to light touch and  in bilateral upper and lower extremities  Musculoskeletal: Reduced ROM due to pain LLE , pre patellar swelling a no sig joint effusion , mild tenderness    Assessment/Plan: 1. Functional deficits which require 3+ hours per day of interdisciplinary therapy in a comprehensive inpatient rehab setting. Physiatrist is providing close team supervision and 24 hour management of active medical problems listed below. Physiatrist and rehab team continue to assess barriers to discharge/monitor patient progress toward functional and medical goals  Care Tool:  Bathing    Body parts bathed by patient: Face, Right arm, Left arm, Chest, Abdomen, Front perineal area, Right upper leg, Left upper leg, Right lower leg, Left lower leg   Body parts bathed by helper: Buttocks     Bathing assist Assist Level: Minimal Assistance - Patient > 75%     Upper Body  Dressing/Undressing Upper body dressing   What is the patient wearing?: Hospital gown only    Upper body assist Assist Level: Minimal Assistance - Patient > 75%    Lower Body Dressing/Undressing Lower body dressing      What is the patient wearing?: Hospital gown only     Lower body assist Assist for lower body dressing: Minimal Assistance - Patient > 75%     Toileting Toileting    Toileting assist Assist for toileting: Moderate Assistance - Patient 50 - 74%     Transfers Chair/bed transfer  Transfers assist     Chair/bed transfer assist level: Minimal Assistance - Patient > 75% Chair/bed transfer assistive device: Walker, Clinical biochemist   Ambulation assist      Assist level: Minimal Assistance - Patient > 75% Assistive device: Walker-rolling Max distance: 90ft   Walk 10 feet activity   Assist     Assist level: Minimal Assistance - Patient > 75% Assistive device: Walker-rolling   Walk 50 feet activity   Assist Walk 50 feet with 2 turns activity did not occur: Refused         Walk 150 feet activity   Assist Walk 150 feet activity did not occur: Safety/medical concerns  Walk 10 feet on uneven surface  activity   Assist Walk 10 feet on uneven surfaces activity did not occur: Safety/medical concerns         Wheelchair     Assist Is the patient using a wheelchair?: No Type of Wheelchair: Manual    Wheelchair assist level: Minimal Assistance - Patient > 75% Max wheelchair distance: 150    Wheelchair 50 feet with 2 turns activity    Assist        Assist Level: Minimal Assistance - Patient > 75%   Wheelchair 150 feet activity     Assist      Assist Level: Minimal Assistance - Patient > 75%   Blood pressure 112/72, pulse 61, temperature 98.1 F (36.7 C), temperature source Oral, resp. rate 14, height 5\' 10"  (1.778 m), weight 91.1 kg, SpO2 94 %.  Medical Problem List and Plan: 1.  Functional deficits secondary to acute infarct bilateral cerebral hemispheres, right basal ganglia posterior limb right internal capsule left thalamus/embolic shower likely due to embolism source with A-fib failed Xarelto now on Eliquis               -patient may shower             -ELOS/Goals: 2/22 modI             -Continue CIR- PT, OT and SLP  She says it would be easier for her son to pick her up on Wednesday  morning- messaged team to let them know this 2.  Antithrombotics: -DVT/anticoagulation:  Pharmaceutical: Other (comment) Eliquis             -antiplatelet therapy: N/A 3. Left knee pain: Recommended applying ice 15 minutes three times per day.Tylenol as needed- reviewed CT L knee, OA, soft tissue hematoma and blood in patellar bursa   Abrasion healing well but has persistent prepatellar swelling + Hemorrhagic effusion, WBAT, no sign of soft tissue infection  4. Mood: Provide emotional support             -antipsychotic agents: N/A 5. Neuropsych: This patient is capable of making decisions on her own behalf. 6. Skin/Wound Care: Routine skin checks 7. Fluids/Electrolytes/Nutrition: Routine in and outs with follow-up chemistries 8.  Hyperlipidemia.  Conitnue Lipitor 9.  not on antihypertensive meds at home , will cont to monitor , normotensive today  Vitals:   07/14/21 0538 07/14/21 0540  BP: 112/72   Pulse: (!) 57 61  Resp: 14 14  Temp: 98.1 F (36.7 C)   SpO2: 93% 94%  2/21 controlled off meds  10.  History of breast cancer.  Continue Femara 11.  COPD/asthma.  Monitor oxygen saturations every shift.  Continue nebulizer as needed.  Follow-up Dr. Annamaria Boots, add flonase for rhinitis  12.  Atrial fibrillation.  Cardiac rate controlled.  Continue Eliquis.  Follow-up Dr. Marlou Porch- rate controlled no BB 13.  Diastolic congestive heart failure.  No signs of fluid overload 14. Overweight: BMI 28.82- provide dietary education    LOS: 13 days A FACE TO FACE EVALUATION WAS  PERFORMED  Laura Mendez 07/14/2021, 7:57 AM

## 2021-07-14 NOTE — Plan of Care (Signed)
Problem: RH Bed to Chair Transfers Goal: LTG Patient will perform bed/chair transfers w/assist (PT) Description: LTG: Patient will perform bed to chair transfers with assistance (PT). Outcome: Adequate for Discharge Flowsheets (Taken 07/14/2021 1640) LTG: Pt will perform Bed to Chair Transfers with assistance level: Supervision/Verbal cueing Pt's L knee injury prevented full progress to Mod I with transfers. Is going home supervision level with functional transfers with 24/7 supervision and is adequate for d/c home with son and dtr-in-law.    Problem: RH Balance Goal: LTG Patient will maintain dynamic sitting balance (PT) Description: LTG:  Patient will maintain dynamic sitting balance with assistance during mobility activities (PT) Outcome: Completed/Met Flowsheets (Taken 07/14/2021 1640) LTG: Pt will maintain dynamic sitting balance during mobility activities with:: Independent Goal: LTG Patient will maintain dynamic standing balance (PT) Description: LTG:  Patient will maintain dynamic standing balance with assistance during mobility activities (PT) Outcome: Completed/Met Flowsheets (Taken 07/14/2021 1640) LTG: Pt will maintain dynamic standing balance during mobility activities with:: Supervision/Verbal cueing   Problem: RH Bed Mobility Goal: LTG Patient will perform bed mobility with assist (PT) Description: LTG: Patient will perform bed mobility with assistance, with/without cues (PT). Outcome: Completed/Met Flowsheets (Taken 07/14/2021 1640) LTG: Pt will perform bed mobility with assistance level of: Independent   Problem: RH Car Transfers Goal: LTG Patient will perform car transfers with assist (PT) Description: LTG: Patient will perform car transfers with assistance (PT). Outcome: Completed/Met Flowsheets (Taken 07/14/2021 1640) LTG: Pt will perform car transfers with assist:: Supervision/Verbal cueing   Problem: RH Ambulation Goal: LTG Patient will ambulate in controlled  environment (PT) Description: LTG: Patient will ambulate in a controlled environment, # of feet with assistance (PT). Outcome: Completed/Met Flowsheets (Taken 07/14/2021 1640) LTG: Pt will ambulate in controlled environ  assist needed:: Supervision/Verbal cueing LTG: Ambulation distance in controlled environment: 200 ft using RW Goal: LTG Patient will ambulate in home environment (PT) Description: LTG: Patient will ambulate in home environment, # of feet with assistance (PT). Outcome: Completed/Met Flowsheets Taken 07/14/2021 1640 by Alger Simons, PT LTG: Pt will ambulate in home environ  assist needed:: Supervision/Verbal cueing Taken 07/02/2021 1740 by [name], PT LTG: Ambulation distance in home environment: 63ft with LRAD   Problem: RH Wheelchair Mobility Goal: LTG Patient will propel w/c in controlled environment (PT) Description: LTG: Patient will propel wheelchair in controlled environment, # of feet with assist (PT) Outcome: Completed/Met Flowsheets Taken 07/14/2021 1640 by Alger Simons, PT LTG: Pt will propel w/c in controlled environ  assist needed:: Supervision/Verbal cueing Taken 07/02/2021 1740 by [name], PT LTG: Propel w/c distance in controlled environment: 122ft   Problem: RH Stairs Goal: LTG Patient will ambulate up and down stairs w/assist (PT) Description: LTG: Patient will ambulate up and down # of stairs with assistance (PT) Outcome: Completed/Met Flowsheets Taken 07/14/2021 1640 by Judieth Keens A, PT LTG: Pt will ambulate up/down stairs assist needed:: Contact Guard/Touching assist Taken 07/02/2021 1740 by [name], PT LTG: Pt will  ambulate up and down number of stairs: 3 steps with 1 rail to access home

## 2021-07-14 NOTE — Progress Notes (Signed)
Occupational Therapy Discharge Summary  Patient Details  Name: Laura Mendez MRN: [id number] Date of Birth: Jul 29, 1940  Today's Date: 07/14/2021 OT Individual Time: 1003-1100 OT Individual Time Calculation (min): 57 min    Patient has met 12 of 12 long term goals due to improved activity tolerance, improved balance, postural control, ability to compensate for deficits, functional use of  LEFT upper extremity, improved attention, improved awareness, and improved coordination.  Patient to discharge at overall Supervision level.  Patient's care partner is independent to provide the necessary physical and cognitive assistance at discharge.    Reasons goals not met: NA  Recommendation:  Patient will benefit from ongoing skilled OT services in outpatient setting to continue to advance functional skills in the area of BADL, iADL, and Reduce care partner burden.  Equipment: No equipment provided  Reasons for discharge: treatment goals met and discharge from hospital  Patient/family agrees with progress made and goals achieved: Yes  OT Discharge Precautions/Restrictions  Precautions Precautions: Fall Precaution Comments: mild L hemi, wound on L knee Restrictions Weight Bearing Restrictions: No  Pain Pain Assessment Pain Scale: Faces Faces Pain Scale: No hurt ADL ADL Eating: Set up Where Assessed-Eating: Wheelchair Grooming: Modified independent Where Assessed-Grooming: Sitting at sink Upper Body Bathing: Modified independent Where Assessed-Upper Body Bathing: Shower Lower Body Bathing: Supervision/safety Where Assessed-Lower Body Bathing: Shower Upper Body Dressing: Supervision/safety Where Assessed-Upper Body Dressing: Wheelchair Lower Body Dressing: Supervision/safety Where Assessed-Lower Body Dressing: Edge of bed Toileting: Supervision/safety Where Assessed-Toileting: Glass blower/designer: Close supervision Toilet Transfer Method: Information systems manager: Grab bars, Raised toilet seat, Bedside commode Tub/Shower Transfer: Close supervison Social research officer, government: Close supervision Social research officer, government Method: Heritage manager: Gaffer Baseline Vision/History: 1 Wears glasses (readers) Patient Visual Report: No change from baseline Vision Assessment?: Yes Eye Alignment: Within Functional Limits Ocular Range of Motion: Within Functional Limits Alignment/Gaze Preference: Within Defined Limits Tracking/Visual Pursuits: Able to track stimulus in all quads without difficulty;Requires cues, head turns, or add eye shifts to track Saccades: Within functional limits Convergence: Impaired (comment) Visual Fields: No apparent deficits Perception  Perception: Within Functional Limits Praxis Praxis: Intact Cognition Overall Cognitive Status: Within Functional Limits for tasks assessed Arousal/Alertness: Awake/alert Orientation Level: Oriented X4 Year: 2023 Month: February Day of Week: Correct Attention: Alternating Alternating Attention: Appears intact Memory: Appears intact Immediate Memory Recall: Sock;Blue;Bed Memory Recall Sock: Not able to recall Memory Recall Blue: Without Cue Memory Recall Bed: Not able to recall Awareness: Appears intact Awareness Impairment: Emergent impairment Problem Solving: Appears intact Safety/Judgment: Appears intact Sensation Sensation Light Touch: Appears Intact Hot/Cold: Appears Intact Proprioception: Appears Intact Stereognosis: Appears Intact Coordination Gross Motor Movements are Fluid and Coordinated: No Fine Motor Movements are Fluid and Coordinated: No Coordination and Movement Description: mild L hemi, L knee pain but much improved from eval Finger Nose Finger Test: slowed on L but grossly [facility] 9 Hole Peg Test: R: 25 seconds; L: 58 seconds improved from unable to complete at eval Motor  Motor Motor: Hemiplegia Motor - Discharge  Observations: mild L hemi and knee pain, much improved from eval Mobility  Bed Mobility Bed Mobility: Not assessed Transfers Sit to Stand: Supervision/Verbal cueing Stand to Sit: Supervision/Verbal cueing  Trunk/Postural Assessment  Cervical Assessment Cervical Assessment: Within Functional Limits Thoracic Assessment Thoracic Assessment: Exceptions to [facility] (mild rounded shoulders) Lumbar Assessment Lumbar Assessment: Exceptions to [facility] (mild posterior pelvic tilt in sitting) Postural Control Postural Control: Within Functional Limits  Balance Balance Balance Assessed: Yes  Static Sitting Balance Static Sitting - Balance Support: Feet supported;No upper extremity supported Static Sitting - Level of Assistance: 6: Modified independent (Device/Increase time) Dynamic Sitting Balance Dynamic Sitting - Balance Support: Feet unsupported Dynamic Sitting - Level of Assistance: 5: Stand by assistance Static Standing Balance Static Standing - Balance Support: Bilateral upper extremity supported;During functional activity Static Standing - Level of Assistance: 5: Stand by assistance Dynamic Standing Balance Dynamic Standing - Balance Support: During functional activity;Bilateral upper extremity supported Dynamic Standing - Level of Assistance: 5: Stand by assistance Extremity/Trunk Assessment RUE Assessment RUE Assessment: Within Functional Limits General Strength Comments: 5/5 throughout, 25 lb grip strength LUE Assessment LUE Assessment: Exceptions to [facility] General Strength Comments: 4-/5 throughout, 13 lbs grip strength LUE Body System: Neuro Brunstrum levels for arm and hand: Arm;Hand Brunstrum level for arm: Stage V Relative Independence from Synergy Brunstrum level for hand: Stage VI Isolated joint movements  Session 1 (1003-1100): Pt received seated in w/c, reports ongoing L knee pain, but much improved, agreeable to therapy. Session focus on self-care/IADL retraining, activity  tolerance, transfer retraining, dynamic standing balance, LUE NMR/FMC in prep for improved ADL/IADL/func mobility performance + decreased caregiver burden. Ambulated to and from gym with RW and close S. Administered DC reassessments + 9HPT + assessed B grip strength as documented above. Issued green compliant sponge to promote L grip strength + pt practiced picking up and placing push pins into cork board to promote LUE grip strength/FMC. Reviewed additional [facility] pt can complete at home for HEP in addition to theraputty/sponge.   Additionally reviewed common s/sx of CVA and modifiable lifestyle factors to reduce risk for recurrent CVA; issued stroke support group flyer and Be Fast acronym wallet card.   Pt simulated making oatmeal in kitchen with overall close S. Reviewed item transport with RW and energy conservation techniques. Continue to recommend close S with mobility upon DC.  Ice provided for L knee pain management. Pt left seated in w/c with safety belt alarm engaged, call bell in reach, and all immediate needs met.   Volanda Napoleon MS, OTR/L  07/14/2021, 12:46 PM

## 2021-07-14 NOTE — Progress Notes (Signed)
Physical Therapy Discharge Summary  Patient Details  Name: Laura Mendez MRN: [id number] Date of Birth: 05/01/1941  Today's Date: 07/14/2021 PT Individual Time: 1332-1430 PT Individual Time Calculation (min): 58 min    Patient has met 8 of 9 long term goals due to improved activity tolerance, improved balance, increased strength, functional use of  left upper extremity and left lower extremity, and improved awareness.  Patient to discharge at an ambulatory level Supervision.   Patient's care partner is independent to provide the necessary physical and cognitive assistance at discharge.  Reasons goals not met: Pt's L knee injury continues to limit her stability during transfers and will require supervision for transfers from bed to seat. She is going home at supervision level for functional transfers with 24/7 supervision from son and daughter-in-law.  Recommendation:  Patient will benefit from ongoing skilled PT services in outpatient setting to continue to advance safe functional mobility, address ongoing impairments in strength, coordination, balance, activity tolerance, cognition, safety awareness, and to minimize fall risk.  Equipment: No equipment provided  Reasons for discharge: treatment goals met and discharge from hospital  Patient/family agrees with progress made and goals achieved: Yes  PT Discharge Precautions/Restrictions Precautions Precautions: Fall Precaution Comments: mild L hemi, healing wound on L knee Restrictions Weight Bearing Restrictions: No Vital Signs Therapy Vitals Temp: 97.7 F (36.5 C) Pulse Rate: 71 Resp: 17 BP: 123/64 Patient Position (if appropriate): Sitting Oxygen Therapy SpO2: 97 % O2 Device: Room Air Pain Pain Assessment Pain Scale: 0-10 Pain Score: 3  Pain Type: Acute pain Pain Location: Knee Pain Orientation: Left Pain Descriptors / Indicators: Aching Pain Onset: With Activity Pain Intervention(s): Cold  applied;Repositioned Pain Interference Pain Interference Pain Effect on Sleep: 1. Rarely or not at all Pain Interference with Therapy Activities: 1. Rarely or not at all Pain Interference with Day-to-Day Activities: 1. Rarely or not at all Vision/Perception  Vision - History Ability to See in Adequate Light: 0 Adequate Vision - Assessment Eye Alignment: Within Functional Limits Ocular Range of Motion: Within Functional Limits Alignment/Gaze Preference: Within Defined Limits Tracking/Visual Pursuits: Able to track stimulus in all quads without difficulty;Requires cues, head turns, or add eye shifts to track Saccades: Within functional limits Convergence: Impaired (comment) Perception Perception: Within Functional Limits Praxis Praxis: Intact  Cognition Overall Cognitive Status: Within Functional Limits for tasks assessed Arousal/Alertness: Awake/alert Orientation Level: Oriented X4 Year: 2023 Month: February Day of Week: Correct Attention: Alternating Selective Attention: Appears intact Alternating Attention: Appears intact Memory: Appears intact Immediate Memory Recall: Sock;Blue;Bed Memory Recall Sock: Not able to recall Memory Recall Blue: Without Cue Memory Recall Bed: Not able to recall Awareness: Appears intact Awareness Impairment: Emergent impairment Problem Solving: Appears intact Safety/Judgment: Appears intact Sensation Sensation Light Touch: Appears Intact Hot/Cold: Appears Intact Proprioception: Appears Intact Stereognosis: Appears Intact Coordination Gross Motor Movements are Fluid and Coordinated: No Fine Motor Movements are Fluid and Coordinated: No Coordination and Movement Description: mild L hemi, L knee pain but much improved from eval Finger Nose Finger Test: slowed on L but grossly [facility] Heel Shin Test: limted ROM due to healing L knee wound; RLE [facility] 9 Hole Peg Test: R: 25 seconds; L: 58 seconds improved from unable to complete at eval Motor   Motor Motor: Hemiplegia Motor - Discharge Observations: mild L hemipareisis, L knee injury healing and pain improved from eval  Mobility Bed Mobility Bed Mobility: Sit to Supine;Supine to Sit Supine to Sit: Supervision/Verbal cueing Sit to Supine: Independent Transfers Transfers: Sit to Stand;Stand  Pivot Transfers;Stand to Sit Sit to Stand: Supervision/Verbal cueing Stand to Sit: Supervision/Verbal cueing Stand Pivot Transfers: Supervision/Verbal cueing Stand Pivot Transfer Details: Verbal cues for safe use of DME/AE;Verbal cues for technique Transfer (Assistive device): Rolling walker Locomotion  Gait Ambulation: Yes Gait Assistance: Supervision/Verbal cueing Gait Distance (Feet): 200 Feet Assistive device: Rolling walker Gait Assistance Details: Verbal cues for safe use of DME/AE Gait Gait: Yes Gait Pattern: Trunk flexed Gait velocity: decreased Stairs / Additional Locomotion Stairs: Yes Stairs Assistance: Contact Guard/Touching assist;Minimal Assistance - Patient > 75% Stair Management Technique: One rail Right Number of Stairs: 4 Height of Stairs: 6 Curb: Contact Guard/Touching assist Wheelchair Mobility Wheelchair Mobility: No  Trunk/Postural Assessment  Cervical Assessment Cervical Assessment: Exceptions to [facility] (forward head) Thoracic Assessment Thoracic Assessment: Exceptions to [facility] (rounded shoulders) Lumbar Assessment Lumbar Assessment: Exceptions to [facility] (posterior pelvic tilt) Postural Control Postural Control: Within Functional Limits  Balance Balance Balance Assessed: Yes Standardized Balance Assessment Standardized Balance Assessment: Berg Balance Test Berg Balance Test Sit to Stand: Able to stand  independently using hands Standing Unsupported: Able to stand 2 minutes with supervision Sitting with Back Unsupported but Feet Supported on Floor or Stool: Able to sit safely and securely 2 minutes Stand to Sit: Controls descent by using  hands Transfers: Able to transfer safely, definite need of hands Standing Unsupported with Eyes Closed: Able to stand 10 seconds with supervision Standing Ubsupported with Feet Together: Needs help to attain position and unable to hold for 15 seconds From Standing, Reach Forward with Outstretched Arm: Can reach forward >12 cm safely (5") From Standing Position, Pick up Object from Floor: Unable to pick up and needs supervision From Standing Position, Turn to Look Behind Over each Shoulder: Looks behind one side only/other side shows less weight shift Turn 360 Degrees: Needs close supervision or verbal cueing Standing Unsupported, Alternately Place Feet on Step/Stool: Needs assistance to keep from falling or unable to try Standing Unsupported, One Foot in Front: Able to take small step independently and hold 30 seconds Standing on One Leg: Unable to try or needs assist to prevent fall Total Score: 29 Static Sitting Balance Static Sitting - Balance Support: Feet supported;No upper extremity supported Static Sitting - Level of Assistance: 6: Modified independent (Device/Increase time) Dynamic Sitting Balance Dynamic Sitting - Balance Support: Feet supported Dynamic Sitting - Level of Assistance: 6: Modified independent (Device/Increase time) Static Standing Balance Static Standing - Balance Support: Bilateral upper extremity supported;During functional activity Static Standing - Level of Assistance: 5: Stand by assistance Dynamic Standing Balance Dynamic Standing - Balance Support: During functional activity;Bilateral upper extremity supported Dynamic Standing - Level of Assistance: 5: Stand by assistance;4: Min assist (CGA/ supervision) Extremity Assessment  RUE Assessment RUE Assessment: Within Functional Limits General Strength Comments: 5/5 throughout, 25 lb grip strength LUE Assessment LUE Assessment: Exceptions to [facility] General Strength Comments: 4-/5 throughout, 13 lbs grip  strength LUE Body System: Neuro Brunstrum levels for arm and hand: Arm;Hand Brunstrum level for arm: Stage V Relative Independence from Synergy Brunstrum level for hand: Stage VI Isolated joint movements RLE Assessment General Strength Comments: grossly 4+/5 LLE Assessment General Strength Comments: 4- to 4/5 hip flexion and ankle PF. all others 4/5 with MMT   Skilled Therapeutic Interventions Patient seated upright in w/c on entrance to room. Son present for family education. Patient alert and agreeable to PT session.   Patient with no pain complaint throughout session.  Therapeutic Activity: Transfers: Patient performed sit<>stand and stand pivot transfers throughout session  with supervision using RW. Provided min verbal cues for forward lean in rise to stand.  Toilet transfer performed with distant supervision and pt manages clothing and performs pericare with distant supervision with no UE support.   Bed Mobility: Patient performed supine <> sit with IND in standard bed in ADL apartment. No vc required for technique.  Gait Training:  Family education focused on stair training with son to provide safe CGA for pt. Pt ascends steps using R HR and requires vc for leading with RLE and step-to with LLE. Descends using L UE on HR and RUE on therapist's arm with vc for leading with LLE to each step. Son observes on first bout and performs well providing good assist on second bout.   Patient ambulated 150 ft using RW with son providing close supervision/ CGA. Demonstrated good consistent but slow pace.  Patient seated upright  in w/c at end of session with brakes locked, belt alarm set, and all needs within reach.   Alger Simons 07/14/2021, 4:39 PM

## 2021-07-15 DIAGNOSIS — I6310 Cerebral infarction due to embolism of unspecified precerebral artery: Secondary | ICD-10-CM

## 2021-07-15 NOTE — Progress Notes (Signed)
Recreational Therapy Discharge Summary Patient Details  Name: Laura Mendez MRN: [id number] Date of Birth: May 23, 1941 Today's Date: 07/15/2021  Long term goals set: 1  Long term goals met: 1  Comments on progress toward goals: Pt has made excellent progress during LOS and is discharging home with family to provide supervision/assistance.  TR sessions focused on pt education.  Topics included leisure education, activity analysis/modifications, stress management/relaxation training, home safety and community reintegration.  Pt actively participated in sessions with supervision/cues.  Pt is extremely motivated and determined to regain further independence and returning to previously enjoyed activities.  Reasons goals not met: n/a  Equipment acquired: n/a  Reasons for discharge: discharge from hospital  Follow-up: Outpatient  Patient/family agrees with progress made and goals achieved: Yes  [name] 07/15/2021, 8:40 AM

## 2021-07-15 NOTE — Progress Notes (Signed)
Inpatient Rehabilitation Care Coordinator Discharge Note   Patient Details  Name: Laura Mendez MRN: [id number] Date of Birth: [birth year]   Discharge location: Home (son's home temp)  Length of Stay: 14 Days  Discharge activity level: sup/min  Home/community participation: patient son (al)  Patient response MS:[id number] Literacy - How often do you need to have someone help you when you read instructions, pamphlets, or other written material from your doctor or pharmacy?: Often  Patient response CE:YEMVVK Isolation - How often do you feel lonely or isolated from those around you?: Never  Services provided included: MD, RD, PT, OT, SLP, RN, CM, TR, Pharmacy, SW  Financial Services:  Financial Services Utilized: [place] offered to/list presented to: patient son  Follow-up services arranged:  Outpatient    Outpatient Servicies: [facility] PT      Patient response to transportation need: Is the patient able to respond to transportation needs?: Yes In the past 12 months, has lack of transportation kept you from medical appointments or from getting medications?: No In the past 12 months, has lack of transportation kept you from meetings, work, or from getting things needed for daily living?: No    Comments (or additional information):  Patient/Family verbalized understanding of follow-up arrangements:  Yes  Individual responsible for coordination of the follow-up plan: Al  [phone]  Confirmed correct DME delivered: Dyanne Iha 07/15/2021    Dyanne Iha

## 2021-07-15 NOTE — Progress Notes (Signed)
PROGRESS NOTE   Subjective/Complaints:  No issues overnite , discussed d/c , need for PCP appt , PMR f/u ROS- -shortness of breath, abd pain, CP, N/V/C/D,   Objective:   No results found. No results for input(s): WBC, HGB, HCT, PLT in the last 72 hours. No results for input(s): NA, K, CL, CO2, GLUCOSE, BUN, CREATININE, CALCIUM in the last 72 hours.  Intake/Output Summary (Last 24 hours) at 07/15/2021 0735 Last data filed at 07/14/2021 1835 Gross per 24 hour  Intake 360 ml  Output --  Net 360 ml         Physical Exam: Vital Signs Blood pressure 112/64, pulse 60, temperature 98.2 F (36.8 C), resp. rate 14, height 5\' 10"  (1.778 m), weight 91.1 kg, SpO2 94 %.    General: No acute distress Mood and affect are appropriate Heart: Regular rate and rhythm no rubs murmurs or extra sounds Lungs: Clear to auscultation, breathing unlabored, no rales or wheezes Abdomen: Positive bowel sounds, soft nontender to palpation, nondistended  Extremities: No clubbing, cyanosis, or edema Skin: Left pre patellar abrasion healed  Neurologic: Cranial nerves II through XII intact, motor strength is 5/5 in RIght and 4/5 Left deltoid, bicep, tricep, grip, hip flexor, knee extensors, ankle dorsiflexor and plantar flexor Sensory exam normal sensation to light touch and  in bilateral upper and lower extremities  Musculoskeletal: Reduced ROM due to pain LLE , pre patellar swelling a no sig joint effusion , mild tenderness    Assessment/Plan: 1. 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 Stable for D/C today F/u PCP in 3-4 weeks F/u PM&R 2 weeks See D/C summary See D/C instructions   Care Tool:  Bathing    Body parts bathed by patient: Face, Right arm, Left arm, Chest, Abdomen, Front perineal area, Right upper leg, Left upper leg, Right lower leg, Left lower leg, Buttocks   Body parts bathed by helper: Buttocks     Bathing  assist Assist Level: Supervision/Verbal cueing     Upper Body Dressing/Undressing Upper body dressing   What is the patient wearing?: Pull over shirt    Upper body assist Assist Level: Supervision/Verbal cueing    Lower Body Dressing/Undressing Lower body dressing      What is the patient wearing?: Underwear/pull up, Pants     Lower body assist Assist for lower body dressing: Supervision/Verbal cueing     Toileting Toileting    Toileting assist Assist for toileting: Supervision/Verbal cueing     Transfers Chair/bed transfer  Transfers assist     Chair/bed transfer assist level: Supervision/Verbal cueing Chair/bed transfer assistive device: Walker, Clinical biochemist   Ambulation assist      Assist level: Minimal Assistance - Patient > 75% Assistive device: Walker-rolling Max distance: 58ft   Walk 10 feet activity   Assist     Assist level: Minimal Assistance - Patient > 75% Assistive device: Walker-rolling   Walk 50 feet activity   Assist Walk 50 feet with 2 turns activity did not occur: Refused         Walk 150 feet activity   Assist Walk 150 feet activity did not occur: Safety/medical concerns  Walk 10 feet on uneven surface  activity   Assist Walk 10 feet on uneven surfaces activity did not occur: Safety/medical concerns         Wheelchair     Assist Is the patient using a wheelchair?: No Type of Wheelchair: Manual    Wheelchair assist level: Minimal Assistance - Patient > 75% Max wheelchair distance: 150    Wheelchair 50 feet with 2 turns activity    Assist        Assist Level: Minimal Assistance - Patient > 75%   Wheelchair 150 feet activity     Assist      Assist Level: Minimal Assistance - Patient > 75%   Blood pressure 112/64, pulse 60, temperature 98.2 F (36.8 C), resp. rate 14, height 5\' 10"  (1.778 m), weight 91.1 kg, SpO2 94 %.  Medical Problem List and Plan: 1.  Functional deficits secondary to acute infarct bilateral cerebral hemispheres, right basal ganglia posterior limb right internal capsule left thalamus/embolic shower likely due to embolism source with A-fib failed Xarelto now on Eliquis               -patient may shower             -ELOS/Goals: 2/22 modI         set up OP PT, OT    2.  Antithrombotics: -DVT/anticoagulation:  Pharmaceutical: Other (comment) Eliquis             -antiplatelet therapy: N/A 3. Left knee pain: Recommended applying ice 15 minutes three times per day.Tylenol as needed- reviewed CT L knee, OA, soft tissue hematoma and blood in patellar bursa   Abrasion healing well but has persistent prepatellar swelling + Hemorrhagic effusion, WBAT, no sign of soft tissue infection  4. Mood: Provide emotional support             -antipsychotic agents: N/A 5. Neuropsych: This patient is capable of making decisions on her own behalf. 6. Skin/Wound Care: Routine skin checks 7. Fluids/Electrolytes/Nutrition: Routine in and outs with follow-up chemistries 8.  Hyperlipidemia.  Conitnue Lipitor 9.  not on antihypertensive meds at home , will cont to monitor , normotensive today  Vitals:   07/14/21 2021 07/15/21 0526  BP: 120/84 112/64  Pulse: 66 60  Resp: 15 14  Temp: 98 F (36.7 C) 98.2 F (36.8 C)  SpO2: 93% 94%  2/22 controlled off meds  10.  History of breast cancer.  Continue Femara 11.  COPD/asthma.  Monitor oxygen saturations every shift.  Continue nebulizer as needed.  Follow-up Dr. Annamaria Boots, add flonase for rhinitis  12.  Atrial fibrillation.  Cardiac rate controlled.  Continue Eliquis.  Follow-up Dr. Marlou Porch- rate controlled no BB 13.  Diastolic congestive heart failure.  No signs of fluid overload 14. Overweight: BMI 28.82- provide dietary education    LOS: 14 days A FACE TO FACE EVALUATION WAS PERFORMED  Charlett Blake 07/15/2021, 7:35 AM

## 2021-07-22 DIAGNOSIS — I6340 Cerebral infarction due to embolism of unspecified cerebral artery: Secondary | ICD-10-CM | POA: Diagnosis not present

## 2021-07-24 DIAGNOSIS — I6340 Cerebral infarction due to embolism of unspecified cerebral artery: Secondary | ICD-10-CM | POA: Diagnosis not present

## 2021-07-27 ENCOUNTER — Ambulatory Visit: Payer: Medicare Other | Admitting: Hematology and Oncology

## 2021-07-27 DIAGNOSIS — I6340 Cerebral infarction due to embolism of unspecified cerebral artery: Secondary | ICD-10-CM | POA: Diagnosis not present

## 2021-07-29 ENCOUNTER — Encounter: Payer: Self-pay | Admitting: Internal Medicine

## 2021-07-29 ENCOUNTER — Other Ambulatory Visit: Payer: Self-pay

## 2021-07-29 ENCOUNTER — Encounter: Payer: Self-pay | Admitting: Registered Nurse

## 2021-07-29 ENCOUNTER — Other Ambulatory Visit (HOSPITAL_COMMUNITY): Payer: Self-pay

## 2021-07-29 ENCOUNTER — Encounter: Payer: Self-pay | Admitting: Cardiology

## 2021-07-29 ENCOUNTER — Encounter: Payer: Medicare Other | Attending: Registered Nurse | Admitting: Registered Nurse

## 2021-07-29 VITALS — BP 112/70 | HR 71 | Ht 70.0 in | Wt 194.0 lb

## 2021-07-29 DIAGNOSIS — I4891 Unspecified atrial fibrillation: Secondary | ICD-10-CM | POA: Insufficient documentation

## 2021-07-29 DIAGNOSIS — I4892 Unspecified atrial flutter: Secondary | ICD-10-CM

## 2021-07-29 DIAGNOSIS — J449 Chronic obstructive pulmonary disease, unspecified: Secondary | ICD-10-CM | POA: Diagnosis not present

## 2021-07-29 NOTE — Patient Instructions (Signed)
My Chart: [phone] ? ? ?[facility] : [phone] ?

## 2021-07-29 NOTE — Progress Notes (Signed)
Subjective:    Patient ID: Laura Mendez, female    DOB: [birth year], 81 y.o.   MRN: [id number]  HPI: Laura Mendez is a 81 y.o. female who is here for HFU appointment for F/ U of her Embolic Cerebral Infarction, Atrial Fibrillation and Asthma with COPD. She presented to [facility] on 06/28/2021 via EMS.  H&P: Dr Armandina Gemma: 06/28/2021 81 year old female presenting to the emergency department with concern for stroke.  The history is provided by the patient in addition to EMS.  The patient presents with strokelike symptoms.  She was last known normal at 2100 when she went to bed.  This morning at 0 300 she woke up and felt due to weakness in her legs.  She described "my legs giving out" and she subsequently fell to the ground landing on her left elbow and left knee.  She sustained acute skin tear to the left elbow.  She states that her tetanus is up-to-date.  She also sustained a hematoma to the left knee.  She is on Xarelto.  She denies any head trauma or loss of consciousness.  At that same time, she felt lightheaded and felt left arm weakness after the fall.  Her son arrived to the house around 0 600 at 0 700 and she was noted to have slurred speech but no other abnormalities. Neurology Consulted:  CT Spine:    IMPRESSION: 1. No static evidence of acute injury to the cervical spine. 2. Mild multilevel degenerative disc disease/spondylosis and mild to moderate multilevel facet arthropathy.   CTA head/neck is dictated in a separate report.    QIW:LNLGX IMPRESSION: CT head:   1. No evidence of acute intracranial abnormality. 2. 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 3. Mild generalized cerebral and cerebellar atrophy.   CTA neck:   1. The common carotid, internal carotid and vertebral arteries are patent within the neck without hemodynamically significant stenosis. Mild atherosclerotic plaque within the bilateral carotid systems within the neck, as  described. 2. Aortic Atherosclerosis (ICD10-I70.0) and Emphysema (ICD10-J43.9).   CTA head:   1. No intracranial large vessel occlusion or proximal high-grade arterial stenosis identified. 2. Nonstenotic atherosclerotic plaque within the intracranial ICAs. 3. Atherosclerotic irregularity of the M2 and more distal MCA vessels, bilaterally.   MRI:MRA:  IMPRESSION: MRI brain:   1. Mildly motion degraded exam. 2. Fairly numerous subcentimeter acute infarcts within the bilateral cerebral hemispheres, right basal ganglia, posterior limb of right internal capsule, left thalamus and within the bilateral cerebellar hemispheres. Involvement of multiple vascular territories raises suspicion for an embolic process. 3. Background minimal chronic small-vessel ischemic changes within the cerebral white matter. 4. 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 5. Mild generalized parenchymal atrophy. 6. Mild paranasal sinus disease, as described.   MRA head:   1. No intracranial large vessel occlusion or proximal high-grade arterial stenosis. 2. Intracranial atherosclerotic disease, as described.   MRA neck:   1. Motion degradation and non-contrast technique limits evaluation of the aortic arch and proximal major branch vessels of the neck. 2. Within this limitation, the common carotid, internal carotid and vertebral arteries are patent within neck without hemodynamically significant stenosis. Mild atherosclerotic plaque within the bilateral carotid systems within the neck, as described.   Neurology changed her Xarelto to Eliquis.  Ms. Miramontes was admitted to inpatient rehabilitation on 07/01/2021 and discharged home on 07/15/2021. She is receiving outpatient therapy at [facility]. She denies any pain. She rates her pain  0. Also reports she has a good appetite.   Son in room all questions answered.    Pain Inventory Average Pain 0 Pain Right Now 0 My  pain is  No pain  LOCATION OF PAIN  No pain  BOWEL Number of stools per week: 7   BLADDER Normal    Mobility use a walker do you drive?  yes  Function retired  Neuro/Psych No problems in this area  Prior Studies Any changes since last visit?  no  Physicians involved in your care Any changes since last visit?  no   Family History  Problem Relation Age of Onset   COPD Father    Colon cancer Mother 63       mets to pancreas and liver   Kidney cancer Sister 71       d. 13   Lung cancer Sister        d. 51, smoker   Ovarian cancer Sister 62       d. 11   Cirrhosis Sister        d. 58   Melanoma Sister 67   Ovarian cancer Maternal Grandmother        d. 50   Heart disease Maternal Grandfather        rheumatic heart disease   Rectal cancer Paternal Grandfather        d. 56   Parkinson's disease Brother    Ovarian cancer Maternal Aunt    Colon cancer Maternal Uncle        d. 72-73   Heart attack Paternal Uncle        d. 84   Kidney cancer Brother 42   Colon cancer Maternal Uncle        d. 63   Cancer Other        MGMs pat 1/2 sister with uterine and rectal cancer   Colon cancer Cousin        d. 55 - mat first cousin   Breast cancer Neg Hx    Social History   Socioeconomic History   Marital status: Divorced    Spouse name: Not on file   Number of children: Not on file   Years of education: Not on file   Highest education level: Not on file  Occupational History   Occupation: retired  Tobacco Use   Smoking status: Former    Years: 0.00    Types: Cigarettes    Quit date: 05/24/1977    Years since quitting: 44.2   Smokeless tobacco: Never   Tobacco comments:    7/7 pt states she occasionally smoked, let cigs "burn" more than she smoked them  Vaping Use   Vaping Use: Never used  Substance and Sexual Activity   Alcohol use: No   Drug use: No   Sexual activity: Not on file  Other Topics Concern   Not on file  Social History Narrative   Not on  file   Social Determinants of Health   Financial Resource Strain: Not on file  Food Insecurity: Not on file  Transportation Needs: Not on file  Physical Activity: Not on file  Stress: Not on file  Social Connections: Not on file   Past Surgical History:  Procedure Laterality Date   ABDOMINAL HYSTERECTOMY     BREAST LUMPECTOMY Right 12/30/2017   BREAST LUMPECTOMY WITH RADIOACTIVE SEED LOCALIZATION Right 12/30/2017   Procedure: BREAST LUMPECTOMY WITH RADIOACTIVE SEED LOCALIZATION X'S 3;  Surgeon: Rolm Bookbinder, MD;  Location: [place];  Service: General;  Laterality: Right;   BUNIONECTOMY Bilateral    EYE SURGERY Bilateral    cataract removal   Past Medical History:  Diagnosis Date   Asthma    Chronic airway obstruction, not elsewhere classified    Diastolic dysfunction without heart failure    Disorder of bone and cartilage, unspecified    Dysrhythmia    A-Fib   Family history of colon cancer    Family history of kidney cancer    Family history of ovarian cancer    Paroxysmal atrial flutter (HCC)    Stroke ([place])    TIA   TIA (transient ischemic attack)    Unspecified asthma(493.90)    BP 112/70    Pulse 71    Ht '5\' 10"'$  (1.778 m)    Wt 194 lb (88 kg)    SpO2 98%    BMI 27.84 kg/m   Opioid Risk Score:   Fall Risk Score:  `1  Depression screen PHQ 2/9  Depression screen [facility] 2/9 07/29/2021 11/04/2020 04/08/2020 02/26/2019 09/23/2017 09/17/2017 09/15/2017  Decreased Interest 0 0 0 0 0 0 0  Down, Depressed, Hopeless 0 0 0 0 0 0 0  PHQ - 2 Score 0 0 0 0 0 0 0  Altered sleeping 0 - - - - - -  Tired, decreased energy 0 - - - - - -  Change in appetite 0 - - - - - -  Feeling bad or failure about yourself  0 - - - - - -  Trouble concentrating 0 - - - - - -  Moving slowly or fidgety/restless 0 - - - - - -  Suicidal thoughts 0 - - - - - -  PHQ-9 Score 0 - - - - - -  Difficult doing work/chores Not difficult at all - - - - - -  Some recent data might be hidden       Review of  Systems  Constitutional: Negative.   HENT: Negative.    Eyes: Negative.   Respiratory: Negative.    Cardiovascular: Negative.   Gastrointestinal: Negative.   Endocrine: Negative.   Genitourinary: Negative.   Musculoskeletal: Negative.   Skin: Negative.   Allergic/Immunologic: Negative.   Neurological: Negative.   Hematological: Negative.   Psychiatric/Behavioral: Negative.        Objective:   Physical Exam Vitals and nursing note reviewed.  Constitutional:      Appearance: Normal appearance.  Cardiovascular:     Rate and Rhythm: Normal rate and regular rhythm.     Pulses: Normal pulses.     Heart sounds: Normal heart sounds.  Pulmonary:     Effort: Pulmonary effort is normal.     Breath sounds: Normal breath sounds.  Musculoskeletal:     Cervical back: Normal range of motion and neck supple.     Comments: Normal Muscle Bulk and Muscle Testing Reveals:  Upper Extremities: Right: Decreased ROM 45 Degrees and Muscle Strength 4/5 Left Upper Extremity: Full ROM and Muscle Strength 5/5 Lower Extremities: Full ROM and Muscle Strength 5/5 Arises from Table slowly using walker for support Narrow Based  Gait     Skin:    General: Skin is warm and dry.  Neurological:     Mental Status: She is alert and oriented to person, place, and time.  Psychiatric:        Mood and Affect: Mood normal.        Behavior: Behavior normal.  Assessment & Plan:  Embolic Cerebral Infarction: Continue Outpatient [facility] at [facility]: Continue Eliquis. [facility] was called to  schedule and appointment on 07/29/2021 NS 07/30/2021. Awaiting a return call.  ,Atrial Fibrillation: Cardiology Following. Continue Eliquis. She will call Dr Marlou Porch office to schedule HFU appointment  Asthma with COPD.: Pulmonology Following. Continue to monitor.   F/U with Dr Letta Pate in 4- 6 weeks

## 2021-07-30 ENCOUNTER — Other Ambulatory Visit: Payer: Self-pay | Admitting: Internal Medicine

## 2021-07-30 DIAGNOSIS — J449 Chronic obstructive pulmonary disease, unspecified: Secondary | ICD-10-CM

## 2021-07-30 DIAGNOSIS — I6340 Cerebral infarction due to embolism of unspecified cerebral artery: Secondary | ICD-10-CM | POA: Diagnosis not present

## 2021-07-30 MED ORDER — ATORVASTATIN CALCIUM 20 MG PO TABS: 20.0000 mg | ORAL_TABLET | Freq: Every day | ORAL | 3 refills | Status: DC

## 2021-07-31 NOTE — Telephone Encounter (Signed)
-   Albuterol refilled.

## 2021-08-03 DIAGNOSIS — I6340 Cerebral infarction due to embolism of unspecified cerebral artery: Secondary | ICD-10-CM | POA: Diagnosis not present

## 2021-08-04 ENCOUNTER — Other Ambulatory Visit: Payer: Self-pay | Admitting: Hematology and Oncology

## 2021-08-05 DIAGNOSIS — I6340 Cerebral infarction due to embolism of unspecified cerebral artery: Secondary | ICD-10-CM | POA: Diagnosis not present

## 2021-08-10 DIAGNOSIS — I6340 Cerebral infarction due to embolism of unspecified cerebral artery: Secondary | ICD-10-CM | POA: Diagnosis not present

## 2021-08-11 ENCOUNTER — Other Ambulatory Visit: Payer: Self-pay | Admitting: Hematology and Oncology

## 2021-08-12 DIAGNOSIS — I6340 Cerebral infarction due to embolism of unspecified cerebral artery: Secondary | ICD-10-CM | POA: Diagnosis not present

## 2021-08-17 DIAGNOSIS — I6340 Cerebral infarction due to embolism of unspecified cerebral artery: Secondary | ICD-10-CM | POA: Diagnosis not present

## 2021-08-17 MED ORDER — PANTOPRAZOLE SODIUM 40 MG PO TBEC: 40.0000 mg | DELAYED_RELEASE_TABLET | Freq: Every day | ORAL | 3 refills | Status: DC

## 2021-08-17 NOTE — Addendum Note (Signed)
Addended by: Pricilla Holm A on: 08/17/2021 11:35 AM ? ? Modules accepted: Orders ? ?

## 2021-08-19 DIAGNOSIS — I6340 Cerebral infarction due to embolism of unspecified cerebral artery: Secondary | ICD-10-CM | POA: Diagnosis not present

## 2021-08-24 DIAGNOSIS — I6340 Cerebral infarction due to embolism of unspecified cerebral artery: Secondary | ICD-10-CM | POA: Diagnosis not present

## 2021-08-26 DIAGNOSIS — I6340 Cerebral infarction due to embolism of unspecified cerebral artery: Secondary | ICD-10-CM | POA: Diagnosis not present

## 2021-09-01 ENCOUNTER — Ambulatory Visit: Payer: Medicare Other | Admitting: Primary Care

## 2021-09-01 ENCOUNTER — Encounter: Payer: Self-pay | Admitting: Primary Care

## 2021-09-01 ENCOUNTER — Ambulatory Visit (INDEPENDENT_AMBULATORY_CARE_PROVIDER_SITE_OTHER): Payer: Medicare Other

## 2021-09-01 VITALS — BP 130/82 | HR 66 | Temp 97.8°F | Ht 71.0 in | Wt 199.2 lb

## 2021-09-01 DIAGNOSIS — J4551 Severe persistent asthma with (acute) exacerbation: Secondary | ICD-10-CM | POA: Diagnosis not present

## 2021-09-01 DIAGNOSIS — J45901 Unspecified asthma with (acute) exacerbation: Secondary | ICD-10-CM | POA: Insufficient documentation

## 2021-09-01 DIAGNOSIS — J45909 Unspecified asthma, uncomplicated: Secondary | ICD-10-CM | POA: Diagnosis not present

## 2021-09-01 IMAGING — DX DG CHEST 2V
2 series · 2 of 2 positions shown · non-contrast
Comparison: [DATE], [DATE]

CLINICAL DATA: 80-year-old female with a history asthma

EXAM:
CHEST - 2 VIEW

[chest pa]
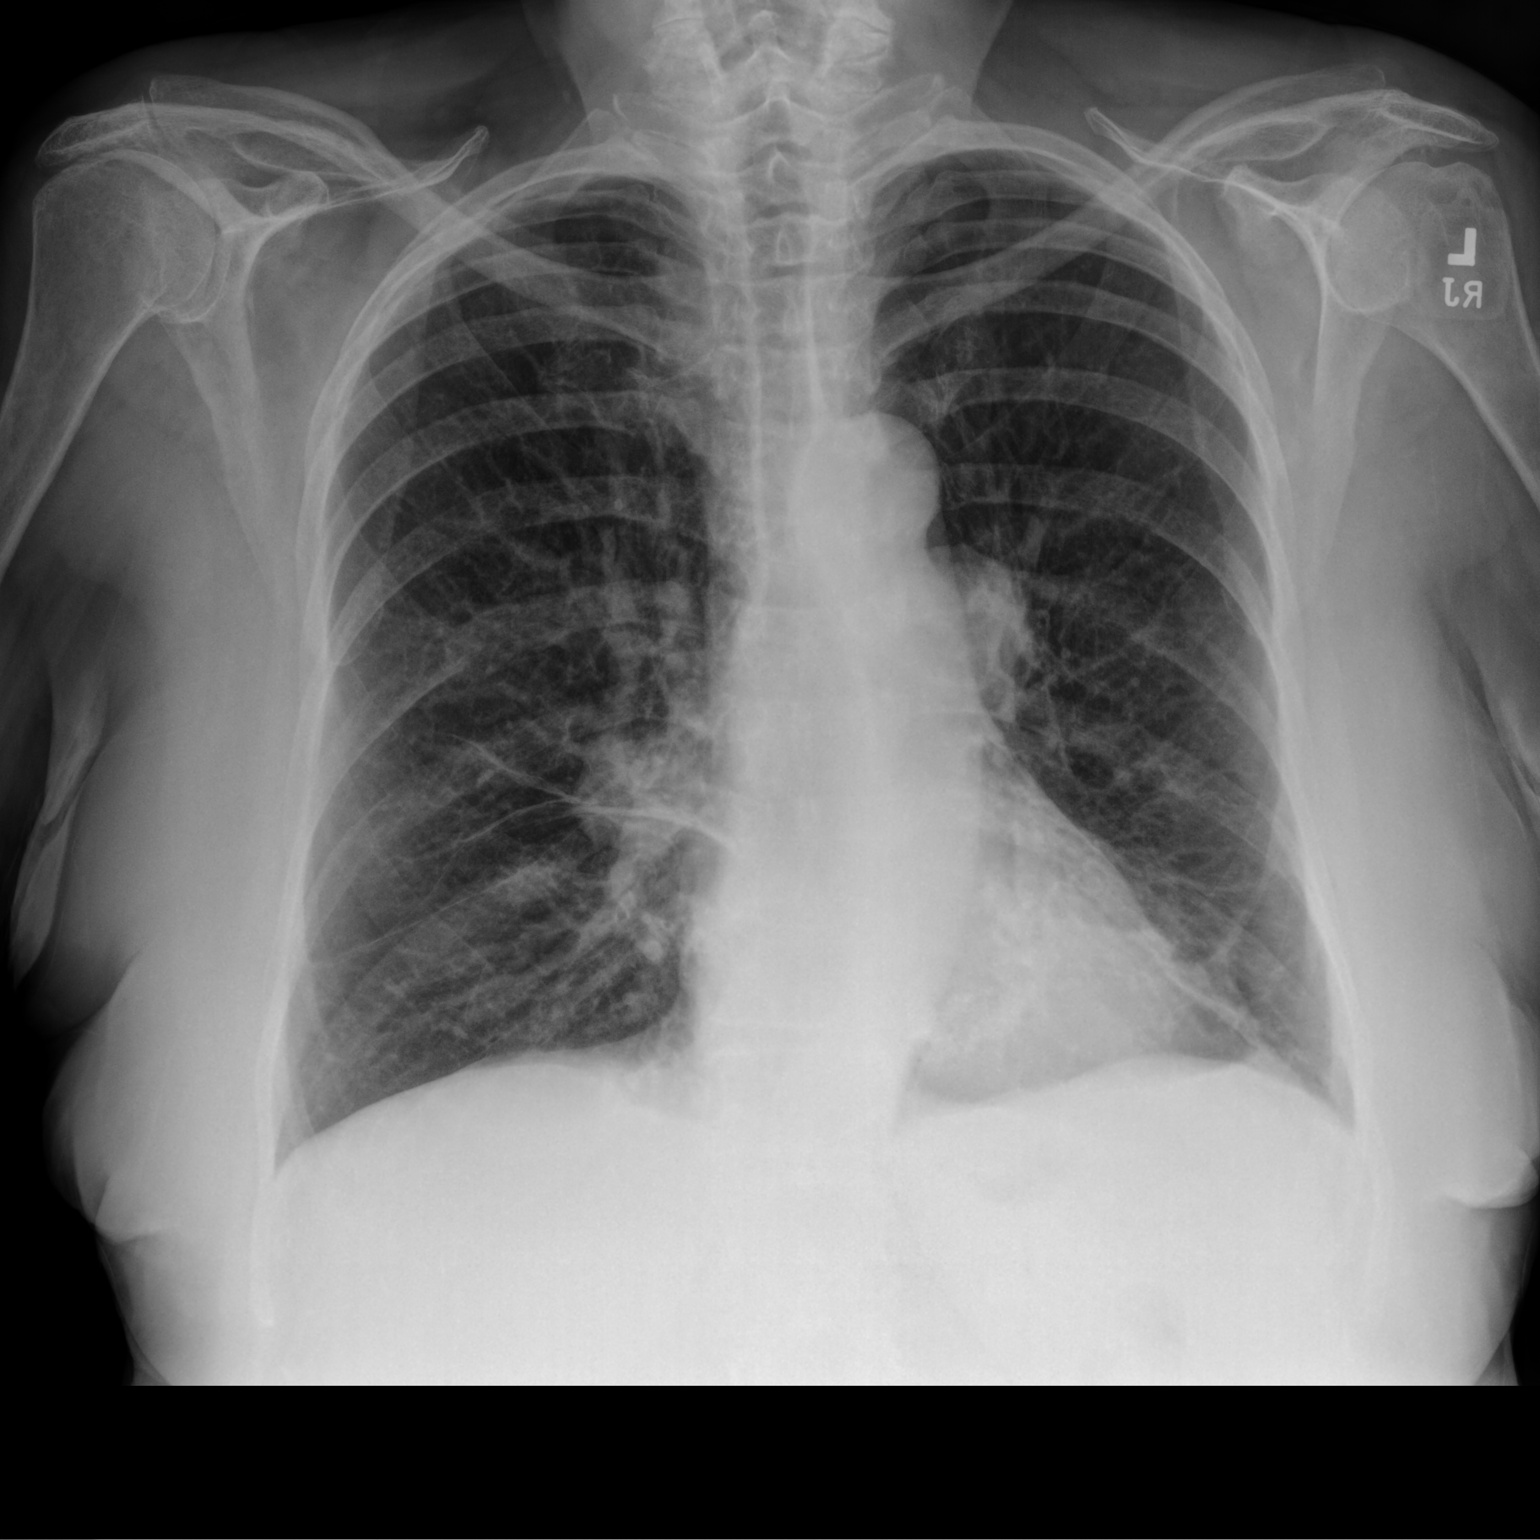

[chest lat]
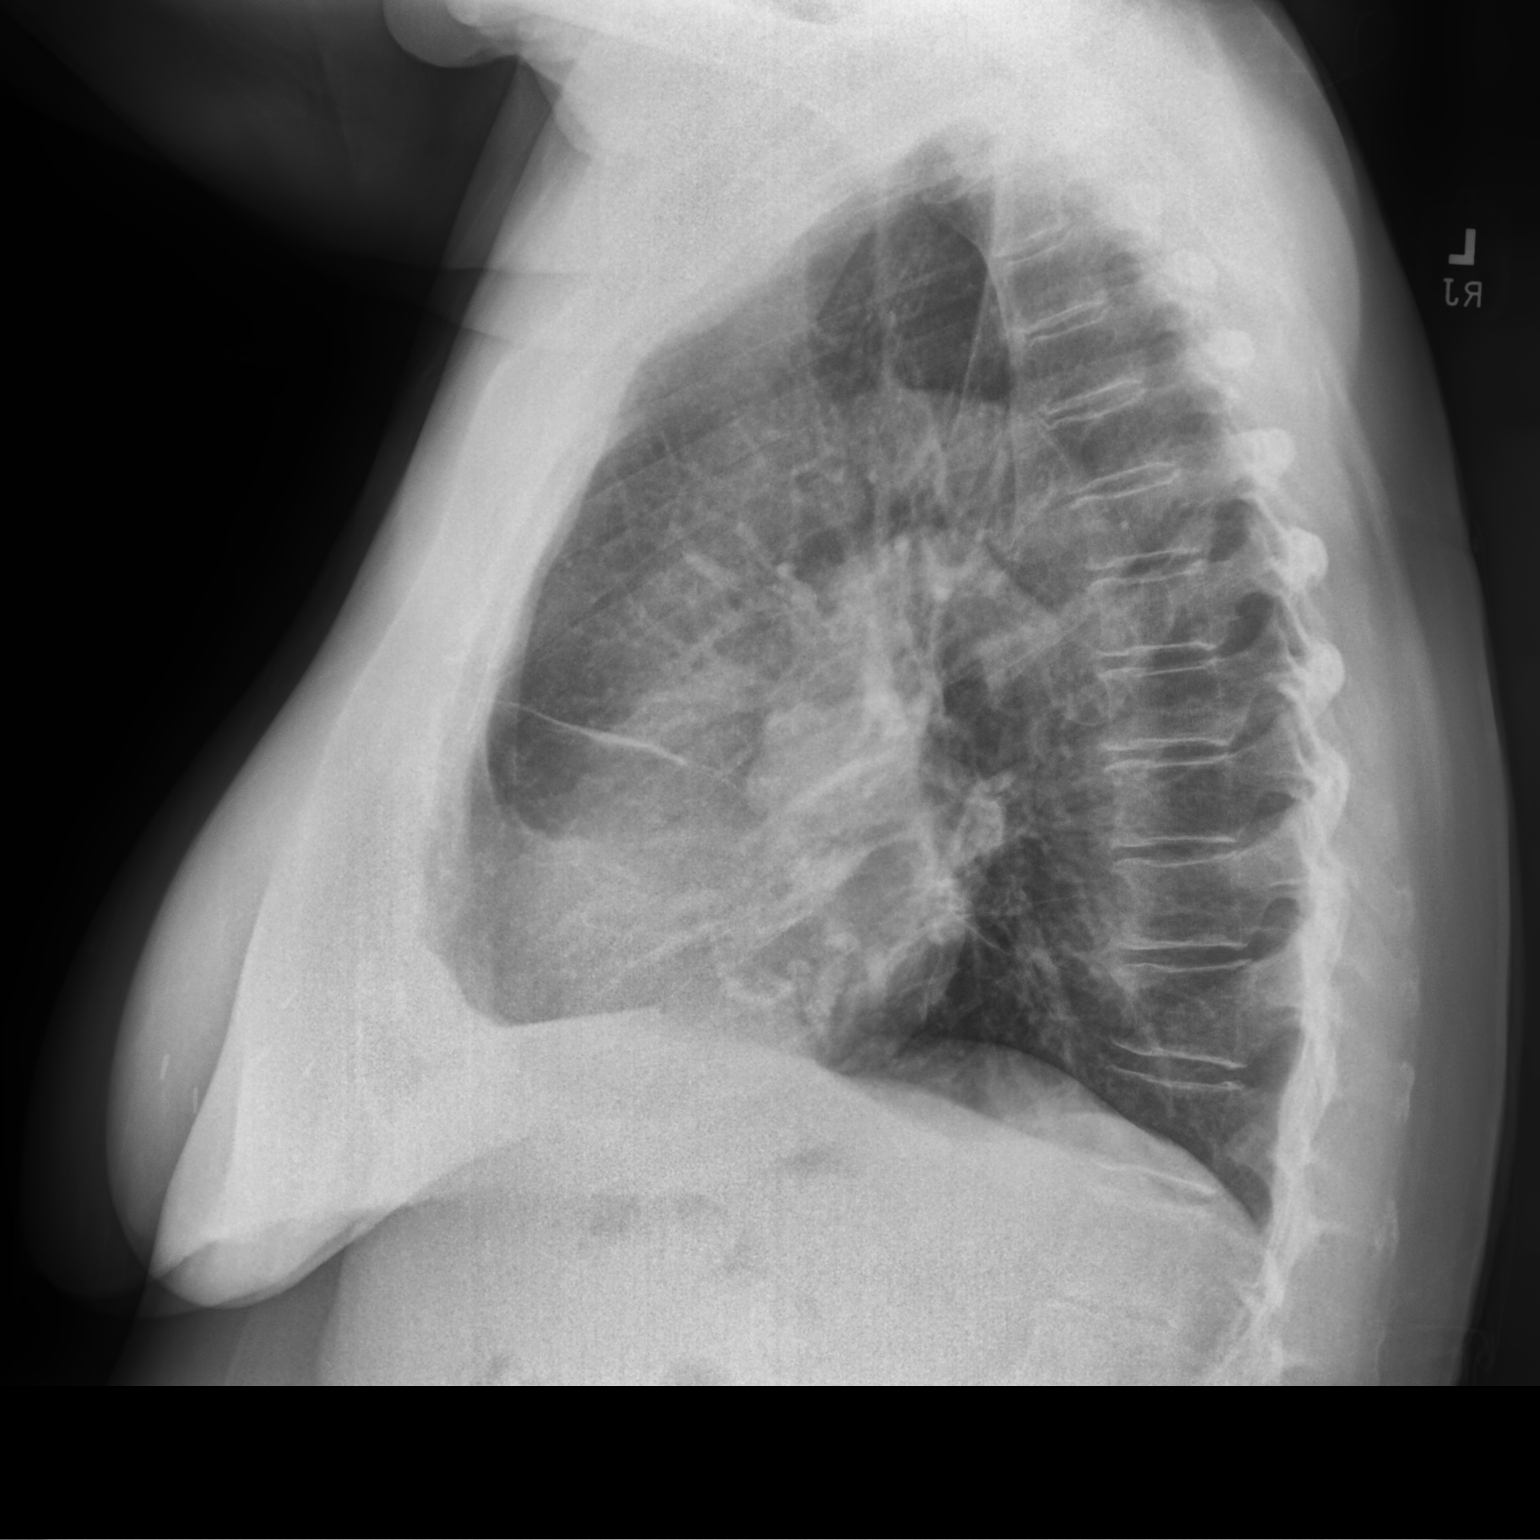

[2 of 2 positions shown; findings below may reference images not displayed]

FINDINGS: Cardiomediastinal silhouette within normal limits. Fullness in the
hilar regions on the frontal and lateral view unchanged dating to
comparison studies [DATE].

No interlobular septal thickening. No pneumothorax. No pleural
effusion. Linear opacity overlying the left heart border unchanged
from prior.

Coarsened interstitial markings with no confluent airspace disease.

Degenerative changes spine.  No displaced fracture.
IMPRESSION: Negative for acute cardiopulmonary disease

## 2021-09-01 MED ORDER — PREDNISONE 10 MG PO TABS: ORAL_TABLET | ORAL | 0 refills | Status: DC

## 2021-09-01 MED ORDER — METHYLPREDNISOLONE ACETATE 80 MG/ML IJ SUSP
80.0000 mg | Freq: Once | INTRAMUSCULAR | Status: AC
  Administered 2021-09-01: 80 mg via INTRAMUSCULAR

## 2021-09-01 NOTE — Progress Notes (Signed)
? ?'@Patient'$  ID: Laura Mendez, female    DOB: 20-Jul-1940, 81 y.o.   MRN: [id number] ? ?Chief Complaint  ?Patient presents with  ? Follow-up  ?  Dry cough and congestion  ? ? ?Referring provider: ?Hoyt Koch, * ? ?HPI: ?81 year old female, former smoker. PMH significant for diastolic HF, CVA, asthma with COPD, allergic rhinitis, breast cancer. Patient of Dr. Annamaria Boots, last seen on 06/01/21.  ? ?09/01/2021 ?Patient presents today for acute OV. She developed a cough 1 week ago. Cough is mainly dry, she will occasionally get up some clear sputum. She is going to her grandson's wedding this weekend and has not seen any improvement with OTC medication. She is using Advair 250-53mg 1 puff twice daily. She takes zyrtec in the morning. She remains on dupixent shots, he sister gives them to her. No post nasal drip. Denies chest tightness or shortness of breath.  ? ? ?Allergies  ?Allergen Reactions  ? Clarithromycin Anaphylaxis  ?  "just about killed me"  ? Ibuprofen Hives  ? Codeine Nausea Only  ? Codeine Phosphate Nausea Only  ? Diltiazem Rash  ? Tape Itching and Other (See Comments)  ?  Tears skin  ? Tapentadol Itching  ?  Other reaction(s): Other (See Comments) ?Tears skin  ? Verapamil Hcl Er Rash  ? ? ?Immunization History  ?Administered Date(s) Administered  ? Fluad Quad(high Dose 65+) 02/06/2019, 04/08/2020, 03/09/2021  ? H1N1 04/24/2008  ? Influenza Split 02/03/2011, 02/18/2012, 02/09/2013, 03/17/2015  ? Influenza Whole 02/22/2007, 02/16/2008, 02/21/2009, 01/23/2010  ? Influenza, High Dose Seasonal PF 03/05/2016, 03/09/2017, 02/22/2018  ? Influenza,inj,Quad PF,6+ Mos 02/09/2013, 02/22/2014  ? Influenza-Unspecified 02/03/2011, 02/09/2013, 02/22/2014  ? PFIZER(Purple Top)SARS-COV-2 Vaccination 06/19/2019, 07/10/2019, 03/18/2020  ? Pneumococcal Conjugate-13 01/08/2015  ? Pneumococcal Polysaccharide-23 05/25/2003, 02/16/2017  ? Td 05/24/2004  ? Tdap 06/24/2015, 04/16/2021  ? Zoster Recombinat (Shingrix)  11/04/2020  ? Zoster, Live 08/22/2011  ? ? ?Past Medical History:  ?Diagnosis Date  ? Asthma   ? Chronic airway obstruction, not elsewhere classified   ? Diastolic dysfunction without heart failure   ? Disorder of bone and cartilage, unspecified   ? Dysrhythmia   ? A-Fib  ? Family history of colon cancer   ? Family history of kidney cancer   ? Family history of ovarian cancer   ? Parox[place]mal atrial flutter ([place]   ? Stroke ([facility]   ? TIA  ? TIA (transient ischemic attack)   ? Unspecified asthma(493.90)   ? ? ?Tobacco History: ?Social History  ? ?Tobacco Use  ?Smoking Status Former  ? Years: 0.00  ? Types: Cigarettes  ? Quit date: 05/24/1977  ? Years since quitting: 44.3  ?Smokeless Tobacco Never  ?Tobacco Comments  ? 7/7 pt states she occasionally smoked, let cigs "burn" more than she smoked them  ? ?Counseling given: Not Answered ?Tobacco comments: 7/7 pt states she occasionally smoked, let cigs "burn" more than she smoked them ? ? ?Outpatient Medications Prior to Visit  ?Medication Sig Dispense Refill  ? acetaminophen (TYLENOL) 325 MG tablet Take 2 tablets (650 mg total) by mouth every 4 (four) hours as needed for mild pain (or temp > 37.5 C (99.5 F)).    ? albuterol (VENTOLIN HFA) 108 (90 Base) MCG/ACT inhaler INHALE 2 PUFFS BY MOUTH EVERY 4 HOURS AS NEEDED 18 g 12  ? apixaban (ELIQUIS) 5 MG TABS tablet Take 1 tablet (5 mg total) by mouth 2 (two) times daily. 60 tablet 0  ? atorvastatin (LIPITOR) 20 MG  tablet Take 1 tablet (20 mg total) by mouth daily. 90 tablet 3  ? cholecalciferol (VITAMIN D) 1000 units tablet Take 1,000 Units by mouth daily.    ? DUPIXENT 300 MG/2ML SOPN Inject 2 mLs into the skin every 30 (thirty) days.    ? fish oil-omega-3 fatty acids 1000 MG capsule Take 1 g by mouth daily.    ? Fluticasone-Salmeterol (WIXELA INHUB) 250-50 MCG/DOSE AEPB Inhale 1 puff into the lungs 2 (two) times daily. 60 each 10  ? letrozole (FEMARA) 2.5 MG tablet Take 1 tablet by mouth once daily (Patient taking differently:  Take 2.5 mg by mouth at bedtime.) 90 tablet 3  ? Multiple Vitamin (MULTIVITAMIN) tablet Take 1 tablet by mouth daily.    ? pantoprazole (PROTONIX) 40 MG tablet Take 1 tablet (40 mg total) by mouth daily. 90 tablet 3  ? fluticasone furoate-vilanterol (BREO ELLIPTA) 200-25 MCG/ACT AEPB Inhale 1 puff into the lungs daily. 60 each 0  ? ?Facility-Administered Medications Prior to Visit  ?Medication Dose Route Frequency Provider Last Rate Last Admin  ? Mepolizumab SOLR 100 mg  100 mg Subcutaneous Q28 days Baird Lyons D, MD   100 mg at 12/20/17 1455  ? ? ?Review of Systems ? ?Review of Systems  ?Constitutional: Negative.   ?HENT:  Positive for congestion. Negative for postnasal drip.   ?Respiratory:  Positive for cough and wheezing. Negative for chest tightness and shortness of breath.   ? ? ?Physical Exam ? ?BP 130/82 (BP Location: Left Arm, Cuff Size: Normal)   Pulse 66   Temp 97.8 ?F (36.6 ?C)   Ht '5\' 11"'$  (1.803 m)   Wt 199 lb 3.2 oz (90.4 kg)   SpO2 96%   BMI 27.78 kg/m?  ?Physical Exam ?Constitutional:   ?   Appearance: Normal appearance.  ?HENT:  ?   Head: Normocephalic and atraumatic.  ?   Mouth/Throat:  ?   Mouth: Mucous membranes are moist.  ?   Pharynx: Oropharynx is clear.  ?Cardiovascular:  ?   Rate and Rhythm: Normal rate and regular rhythm.  ?Pulmonary:  ?   Effort: Pulmonary effort is normal.  ?   Breath sounds: Normal breath sounds.  ?Skin: ?   General: Skin is warm and dry.  ?Neurological:  ?   General: No focal deficit present.  ?   Mental Status: She is alert and oriented to person, place, and time. Mental status is at baseline.  ?Psychiatric:     ?   Mood and Affect: Mood normal.     ?   Behavior: Behavior normal.     ?   Thought Content: Thought content normal.     ?   Judgment: Judgment normal.  ?  ? ?Lab Results: ? ?CBC ?   ?Component Value Date/Time  ? WBC 6.5 07/10/2021 0544  ? RBC 4.09 07/10/2021 0544  ? HGB 11.8 (L) 07/10/2021 0544  ? HGB 14.3 11/16/2017 0812  ? HCT 37.5 07/10/2021 0544   ? PLT 273 07/10/2021 0544  ? PLT 234 11/16/2017 0812  ? MCV 91.7 07/10/2021 0544  ? MCH 28.9 07/10/2021 0544  ? MCHC 31.5 07/10/2021 0544  ? RDW 12.9 07/10/2021 0544  ? LYMPHSABS 1.2 07/02/2021 0504  ? MONOABS 0.7 07/02/2021 0504  ? EOSABS 0.5 07/02/2021 0504  ? BASOSABS 0.0 07/02/2021 0504  ? ? ?BMET ?   ?Component Value Date/Time  ? NA 140 07/02/2021 0504  ? K 3.6 07/02/2021 0504  ? CL 108 07/02/2021 0504  ?  CO2 24 07/02/2021 0504  ? GLUCOSE 112 (H) 07/02/2021 0504  ? GLUCOSE 111 (H) 03/04/2006 1034  ? BUN 8 07/02/2021 0504  ? CREATININE 0.81 07/02/2021 0504  ? CREATININE 0.96 11/16/2017 0812  ? CALCIUM 8.7 (L) 07/02/2021 0504  ? GFRNONAA >60 07/02/2021 0504  ? GFRNONAA 56 (L) 11/16/2017 1638  ? GFRAA >60 12/22/2017 1402  ? GFRAA >60 11/16/2017 0812  ? ? ?BNP ?No results found for: BNP ? ?ProBNP ?No results found for: PROBNP ? ?Imaging: ?No results found. ? ? ?Assessment & Plan:  ? ?Asthma exacerbation ?- Patient developed np cough with associated wheezing 1 week ago. She is compliant with Advair, Dupixent and Zyrtec. She had wheezing on exam. Received depo medrol '80mg'$  IM x1. Sending in RX prednisone taper '40mg'$  x 2 days, '30mg'$  x 2 days, '20mg'$  x 2 days, '10mg'$  x 2 days. Checking CXR. Advised she take mucinex '600mg'$  twice daily prn cough/loosen phlegm. Return if symptoms do not improve or worsen.  ? ? ?Martyn Ehrich, NP ?09/01/2021 ? ?

## 2021-09-01 NOTE — Assessment & Plan Note (Addendum)
-   Patient developed np cough with associated wheezing 1 week ago. She is compliant with Advair, Dupixent and Zyrtec. She had wheezing on exam. Received depo medrol '80mg'$  IM x1. Sending in RX prednisone taper '40mg'$  x 2 days, '30mg'$  x 2 days, '20mg'$  x 2 days, '10mg'$  x 2 days. Checking CXR. Advised she take mucinex '600mg'$  twice daily prn cough/loosen phlegm. Return if symptoms do not improve or worsen.  ?

## 2021-09-01 NOTE — Patient Instructions (Signed)
Recommendations: ?Continue Advair 1 puff twice daily ?Take mucinex 1 tab twice daily as needed for chest congestion ? ?Orders: ?CXR today ? ?Office treatment: ?Depomedrol '80mg'$  x1 ? ?RX: ?Prednisone taper ? ?Follow-up ?If symptoms do not improve or worsen  ?

## 2021-09-03 ENCOUNTER — Telehealth: Payer: Self-pay | Admitting: Primary Care

## 2021-09-03 NOTE — Telephone Encounter (Signed)
Martyn Ehrich, NP  ?09/03/2021  9:48 AM EDT   ?  ?CXR showed no acute cardiopulmonary process  ? ? ?Called and spoke with patient. She verbalized understanding of results.  ? ?Nothing further needed at time of call.  ?

## 2021-09-03 NOTE — Progress Notes (Signed)
CXR showed no acute cardiopulmonary process

## 2021-09-03 NOTE — Telephone Encounter (Signed)
Patient is returning phone call. Patient phone number is [phone]. ?

## 2021-09-03 NOTE — Telephone Encounter (Signed)
Called patient and left voicemail for her to call office back in regards to her chest xray results.  ?

## 2021-09-07 DIAGNOSIS — I6340 Cerebral infarction due to embolism of unspecified cerebral artery: Secondary | ICD-10-CM | POA: Diagnosis not present

## 2021-09-08 ENCOUNTER — Encounter: Payer: Medicare Other | Attending: Registered Nurse | Admitting: Physical Medicine & Rehabilitation

## 2021-09-08 ENCOUNTER — Encounter: Payer: Self-pay | Admitting: Physical Medicine & Rehabilitation

## 2021-09-08 VITALS — BP 133/84 | HR 73 | Ht 71.0 in | Wt 190.0 lb

## 2021-09-08 DIAGNOSIS — I63443 Cerebral infarction due to embolism of bilateral cerebellar arteries: Secondary | ICD-10-CM | POA: Diagnosis not present

## 2021-09-08 NOTE — Progress Notes (Signed)
? ?Subjective:  ? ? Patient ID: Laura Mendez, female    DOB: 1940/11/01, 81 y.o.   MRN: [id number] ? 81 y.o. right-handed female with history of TIA, atrial fibrillation maintained on Xarelto followed by Dr. Marlou Porch, COPD followed by Dr. Annamaria Boots, diastolic congestive heart failure, remote right breast cancer with lumpectomy radioactive seed localization 2019 followed by Dr.Gudena, quit smoking 44 years ago.  Per chart review lives alone independent prior to admission.  Presented 06/28/2021 after a fall while walking from the foot of her bed striking her left knee with noted left-sided weakness and slurred speech.  MRI showed acute infarction within bilateral cerebral hemispheres right basal ganglia posterior limb of right internal capsule left thalamus and within the bilateral cerebellar hemisphere.  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  MRA of the head showed no large vessel occlusion or high-grade stenosis.  MRA of the neck without hemodynamically significant stenosis.  CT of the left knee shows small joint effusion as well as tricompartmental degenerative osteoarthritis changes mostly pronounced at the lateral compartment.  Admission chemistries unremarkable urine drug screen negative.  Echocardiogram with ejection fraction of 60 to 65% no wall motion abnormalities grade 1 diastolic dysfunction.  Neurology follow-up transition from Xarelto to Eliquis.  Therapy evaluations completed due to patient decreased functional mobility was admitted for a comprehensive rehab program.  ? ?  ?Admit date: 07/01/2021 ?Discharge date: 07/15/2021 ? ?HPI ?The patient returns for physical medicine rehabilitation follow-up.  She was seen in this clinic by my nurse practitioner last month.  She is living independently at home however her son does help with things such as shopping.  The patient has returned to driving.  She has been doing this for over a month.  She states that no doctor gave her the okay but both she and  her son states that she has been driving well without any issues.  The patient drives very limited distances according to the son only to the store into his house as well as to the pharmacy ? ?Goes to PT in [facility] 2 per wks, no cane in the home but uses cane for community, working on higher level balance  ? ?No falls ?Using lift chair to go from downstairs to upstair ? ?Mod I dressing and bathing  ?Uses shower chair ?Pain Inventory ?Average Pain 0 ?Pain Right Now 0 ?My pain is  no pain ? ?BOWEL ?Number of stools per week: 7 ? ?BLADDER ?Normal ? ?Mobility ?use a cane ? ?Function ?retired ? ?Neuro/Psych ?No problems in this area ? ?Prior Studies ?Any changes since last visit?  no ? ?Physicians involved in your care ?Any changes since last visit?  no ? ? ?Family History  ?Problem Relation Age of Onset  ? COPD Father   ? Colon cancer Mother 31  ?     mets to pancreas and liver  ? Kidney cancer Sister 102  ?     d. 60  ? Lung cancer Sister   ?     d. 74, smoker  ? Ovarian cancer Sister 39  ?     d. 53  ? Cirrhosis Sister   ?     d. 5  ? Melanoma Sister 20  ? Ovarian cancer Maternal Grandmother   ?     d. 12  ? Heart disease Maternal Grandfather   ?     rheumatic heart disease  ? Rectal cancer Paternal Grandfather   ?     d. 60  ?  Parkinson's disease Brother   ? Ovarian cancer Maternal Aunt   ? Colon cancer Maternal Uncle   ?     d. 72-73  ? Heart attack Paternal Uncle   ?     d. 26  ? Kidney cancer Brother 59  ? Colon cancer Maternal Uncle   ?     d. 15  ? Cancer Other   ?     MGMs pat 1/2 sister with uterine and rectal cancer  ? Colon cancer Cousin   ?     d. 34 - mat first cousin  ? Breast cancer Neg Hx   ? ?Social History  ? ?Socioeconomic History  ? Marital status: Divorced  ?  Spouse name: Not on file  ? Number of children: Not on file  ? Years of education: Not on file  ? Highest education level: Not on file  ?Occupational History  ? Occupation: retired  ?Tobacco Use  ? Smoking status: Former  ?  Years: 0.00  ?   Types: Cigarettes  ?  Quit date: 05/24/1977  ?  Years since quitting: 44.3  ? Smokeless tobacco: Never  ? Tobacco comments:  ?  7/7 pt states she occasionally smoked, let cigs "burn" more than she smoked them  ?Vaping Use  ? Vaping Use: Never used  ?Substance and Sexual Activity  ? Alcohol use: No  ? Drug use: No  ? Sexual activity: Not on file  ?Other Topics Concern  ? Not on file  ?Social History Narrative  ? Not on file  ? ?Social Determinants of Health  ? ?Financial Resource Strain: Not on file  ?Food Insecurity: Not on file  ?Transportation Needs: Not on file  ?Physical Activity: Not on file  ?Stress: Not on file  ?Social Connections: Not on file  ? ?Past Surgical History:  ?Procedure Laterality Date  ? ABDOMINAL HYSTERECTOMY    ? BREAST LUMPECTOMY Right 12/30/2017  ? BREAST LUMPECTOMY WITH RADIOACTIVE SEED LOCALIZATION Right 12/30/2017  ? Procedure: BREAST LUMPECTOMY WITH RADIOACTIVE SEED LOCALIZATION X'S 3;  Surgeon: Rolm Bookbinder, MD;  Location: [facility];  Service: General;  Laterality: Right;  ? BUNIONECTOMY Bilateral   ? EYE SURGERY Bilateral   ? cataract removal  ? ?Past Medical History:  ?Diagnosis Date  ? Asthma   ? Chronic airway obstruction, not elsewhere classified   ? Diastolic dysfunction without heart failure   ? Disorder of bone and cartilage, unspecified   ? Dysrhythmia   ? A-Fib  ? Family history of colon cancer   ? Family history of kidney cancer   ? Family history of ovarian cancer   ? Paroxysmal atrial flutter ([place])   ? Stroke [facility])   ? TIA  ? TIA (transient ischemic attack)   ? Unspecified asthma(493.90)   ? ?BP 133/84   Pulse 73   Ht '5\' 11"'$  (1.803 m)   Wt 190 lb (86.2 kg)   SpO2 93%   BMI 26.50 kg/m?  ? ?Opioid Risk Score:   ?Fall Risk Score:  `1 ? ?Depression screen PHQ 2/9 ? ? ?  09/08/2021  ?  1:05 PM 07/29/2021  ? 12:50 PM 11/04/2020  ?  9:19 AM 04/08/2020  ?  9:21 AM 02/26/2019  ?  8:37 AM 09/23/2017  ?  8:42 AM 09/17/2017  ?  9:16 AM  ?Depression screen PHQ 2/9  ?Decreased Interest 0 0 0  0 0 0 0  ?Down, Depressed, Hopeless 0 0 0 0 0 0 0  ?PHQ -  2 Score 0 0 0 0 0 0 0  ?Altered sleeping  0       ?Tired, decreased energy  0       ?Change in appetite  0       ?Feeling bad or failure about yourself   0       ?Trouble concentrating  0       ?Moving slowly or fidgety/restless  0       ?Suicidal thoughts  0       ?PHQ-9 Score  0       ?Difficult doing work/chores  Not difficult at all       ?  ? ?Review of Systems  ?Constitutional: Negative.   ?HENT: Negative.    ?Eyes: Negative.   ?Respiratory: Negative.    ?Cardiovascular: Negative.   ?Gastrointestinal: Negative.   ?Endocrine: Negative.   ?Genitourinary: Negative.   ?Musculoskeletal: Negative.   ?Skin: Negative.   ?Allergic/Immunologic: Negative.   ?Neurological: Negative.   ?Hematological:  Bruises/bleeds easily.  ?     Eliquis  ?Psychiatric/Behavioral:    ?     Still has short term memory issues  ?All other systems reviewed and are negative. ? ?   ?Objective:  ? Physical Exam ?Vitals and nursing note reviewed.  ?Constitutional:   ?   Appearance: She is normal weight.  ?HENT:  ?   Head: Normocephalic and atraumatic.  ?Eyes:  ?   Extraocular Movements: Extraocular movements intact.  ?   Conjunctiva/sclera: Conjunctivae normal.  ?   Pupils: Pupils are equal, round, and reactive to light.  ?Musculoskeletal:  ?   Comments: No pain with shoulder elbow wrist or hand range of motion no pain with lower extremity range of motion ?Ambulates without assistive device she has difficulty with toe walking and tandem gait but is able to perform heel walking.  ?Skin: ?   General: Skin is warm and dry.  ?Neurological:  ?   Mental Status: She is alert and oriented to person, place, and time.  ?   Cranial Nerves: No dysarthria.  ?   Motor: Motor function is intact. No abnormal muscle tone.  ?   Coordination: Coordination is intact.  ?   Gait: Tandem walk abnormal.  ?   Comments: 5/5 strength bilateral deltoid, bicep, tricep, grip, hip flexor, knee extensor, ankle  dorsiflexor and plantar flexor  ?Psychiatric:     ?   Mood and Affect: Mood normal.     ?   Behavior: Behavior normal.  ? ? ? ? ? ?   ?Assessment & Plan:  ? ?#1.  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

## 2021-09-09 DIAGNOSIS — I6340 Cerebral infarction due to embolism of unspecified cerebral artery: Secondary | ICD-10-CM | POA: Diagnosis not present

## 2021-09-10 ENCOUNTER — Ambulatory Visit: Payer: Medicare Other | Admitting: Family Medicine

## 2021-09-10 ENCOUNTER — Encounter: Payer: Self-pay | Admitting: Family Medicine

## 2021-09-10 VITALS — BP 155/79 | HR 63 | Ht 71.0 in | Wt 195.6 lb

## 2021-09-10 DIAGNOSIS — E785 Hyperlipidemia, unspecified: Secondary | ICD-10-CM | POA: Diagnosis not present

## 2021-09-10 DIAGNOSIS — I63443 Cerebral infarction due to embolism of bilateral cerebellar arteries: Secondary | ICD-10-CM

## 2021-09-10 DIAGNOSIS — I4892 Unspecified atrial flutter: Secondary | ICD-10-CM | POA: Diagnosis not present

## 2021-09-10 DIAGNOSIS — I10 Essential (primary) hypertension: Secondary | ICD-10-CM

## 2021-09-10 NOTE — Progress Notes (Signed)
?[facility] ?[address] ?[place]. [place] [address] ?[phone] ? ?     HOSPITAL FOLLOW UP NOTE ? ?[name] ?Date of Birth:  Jul 20, 1940 ?Medical Record Number:  [id number]  ? ?Reason for Referral:  hospital stroke follow up ? ? ? ?SUBJECTIVE: ? ? ?CHIEF COMPLAINT:  ?Chief Complaint  ?Patient presents with  ? Follow-up  ?  RM 16 with son, Al. Hospital follow up/CVA. Was at [facility] 07/01/21-07/15/21.  Doing PT twice weekly at [facility] (Mon/Therapy). Ambulates with cane. Balance has improved. Has chair lift in townhouse. Has no concerns at this time. Was told she did not need OT or ST.  ? ? ?HPI:  ? ?Laura Mendez is a 81 y.o. who  has a past medical history of Asthma, Chronic airway obstruction, not elsewhere classified, Diastolic dysfunction without heart failure, Disorder of bone and cartilage, unspecified, Dysrhythmia, Family history of colon cancer, Family history of kidney cancer, Family history of ovarian cancer, Paroxysmal atrial flutter ([place]), Stroke ([place]), TIA (transient ischemic attack), and Unspecified asthma(493.90).  Patient presented on 06/28/2021 with left sided weakness causing a fall when getting out of bed around 3am. She also reported left knee pain and noted to have dysarthria and left facial droop. EMS called and she was transported to [facility]. CT was showed chronic lacunar infarct of right caudate head and generalized cerebral and cerebellar atrophy. MRI confirmed numerous acute infarcts bilaterally in right basal ganglia, right PLIC, left thalamus, and cerebellum. She was referred to CIR started on atorvastatin and Xarelto changed to Eliquis. Admitted to rehab 07/01/2021. Personally reviewed hospitalization pertinent progress notes, lab work and imaging.  Evaluated by Dr Erlinda Hong.  ? ?Since discharge from CIR, she reports doing well. She continues PT twice weekly. She continues to have mild left lower extremity weakness. She lives alone in a  town home. She has installed a chair lift. She walks without assistive device in her home but uses cane for longer distances. No falls. She is sleeping well. Appetite is good. She is able to perform ADLs independently. She drives without difficulty.  ? ? ?PERTINENT IMAGING/LABS ? ?Code Stroke CTH no acute intracranial changes and chronic lacunar infarct of R caudate head as well as generalized cerebral and cerebellar atrophy ?CTA head & neck mild atherosclerotic plaques of bilateral Carotids at the level of the neck and intracranially ?MRI  many acute infarcts of bilateral cerebral hemispheres, R basal ganglia, post limb of R internal capsule, L thalamus, and bilateral cerebellar hemispheres, as well as chronic small vessel ischemic changes of cerebral white matter and chronic lacunar infarcts of bilateral caudate nuclei.  ?MRA  No large vessel occlusions or stenosis. Mild atherosclerotic plaques of bilateral carotids within the neck ?2D Echo LVEF 60-65%, Mildly dilated LV internal cavity, grade I diastolic dysfunction, moderately dilated LA, moderate mitral annular calcification ? ? ?A1C ?Lab Results  ?Component Value Date  ? HGBA1C 5.1 06/29/2021  ? ? ?Lipid Panel  ?   ?Component Value Date/Time  ? CHOL 158 06/29/2021 0230  ? TRIG 102 06/29/2021 0230  ? TRIG 183 (H) 03/04/2006 1034  ? HDL 29 (L) 06/29/2021 0230  ? CHOLHDL 5.4 06/29/2021 0230  ? VLDL 20 06/29/2021 0230  ? LDLCALC 109 (H) 06/29/2021 0230  ? LDLDIRECT 108.0 04/09/2020 0955  ? ? ?ROS:   ?14 system review of systems performed and negative with exception of those listed in HPI ? ?PMH:  ?Past Medical History:  ?Diagnosis Date  ?  Asthma   ? Chronic airway obstruction, not elsewhere classified   ? Diastolic dysfunction without heart failure   ? Disorder of bone and cartilage, unspecified   ? Dysrhythmia   ? A-Fib  ? Family history of colon cancer   ? Family history of kidney cancer   ? Family history of ovarian cancer   ? Paroxysmal atrial flutter ([place])    ? Stroke [facility])   ? TIA  ? TIA (transient ischemic attack)   ? Unspecified asthma(493.90)   ? ? ?PSH:  ?Past Surgical History:  ?Procedure Laterality Date  ? ABDOMINAL HYSTERECTOMY    ? BREAST LUMPECTOMY Right 12/30/2017  ? BREAST LUMPECTOMY WITH RADIOACTIVE SEED LOCALIZATION Right 12/30/2017  ? Procedure: BREAST LUMPECTOMY WITH RADIOACTIVE SEED LOCALIZATION X'S 3;  Surgeon: Rolm Bookbinder, MD;  Location: [place] [facility];  Service: General;  Laterality: Right;  ? BUNIONECTOMY Bilateral   ? EYE SURGERY Bilateral   ? cataract removal  ? ? ?Social History:  ?Social History  ? ?Socioeconomic History  ? Marital status: Divorced  ?  Spouse name: Not on file  ? Number of children: Not on file  ? Years of education: Not on file  ? Highest education level: Not on file  ?Occupational History  ? Occupation: retired  ?Tobacco Use  ? Smoking status: Former  ?  Years: 0.00  ?  Types: Cigarettes  ?  Quit date: 05/24/1977  ?  Years since quitting: 44.3  ? Smokeless tobacco: Never  ? Tobacco comments:  ?  7/7 pt states she occasionally smoked, let cigs "burn" more than she smoked them  ?Vaping Use  ? Vaping Use: Never used  ?Substance and Sexual Activity  ? Alcohol use: No  ? Drug use: No  ? Sexual activity: Not on file  ?Other Topics Concern  ? Not on file  ?Social History Narrative  ? Right handed  ? 1 coke per day  ? Lives alone  ? ?Social Determinants of Health  ? ?Financial Resource Strain: Not on file  ?Food Insecurity: Not on file  ?Transportation Needs: Not on file  ?Physical Activity: Not on file  ?Stress: Not on file  ?Social Connections: Not on file  ?Intimate Partner Violence: Not on file  ? ? ?Family History:  ?Family History  ?Problem Relation Age of Onset  ? COPD Father   ? Colon cancer Mother 64  ?     mets to pancreas and liver  ? Kidney cancer Sister 58  ?     d. 58  ? Lung cancer Sister   ?     d. 2, smoker  ? Ovarian cancer Sister 78  ?     d. 28  ? Cirrhosis Sister   ?     d. 4  ? Melanoma Sister 70  ? Ovarian cancer  Maternal Grandmother   ?     d. 65  ? Heart disease Maternal Grandfather   ?     rheumatic heart disease  ? Rectal cancer Paternal Grandfather   ?     d. 41  ? Parkinson's disease Brother   ? Ovarian cancer Maternal Aunt   ? Colon cancer Maternal Uncle   ?     d. 72-73  ? Heart attack Paternal Uncle   ?     d. 54  ? Kidney cancer Brother 61  ? Colon cancer Maternal Uncle   ?     d. 9  ? Cancer Other   ?  MGMs pat 1/2 sister with uterine and rectal cancer  ? Colon cancer Cousin   ?     d. 64 - mat first cousin  ? Breast cancer Neg Hx   ? ? ?Medications:   ?Current Outpatient Medications on File Prior to Visit  ?Medication Sig Dispense Refill  ? acetaminophen (TYLENOL) 325 MG tablet Take 2 tablets (650 mg total) by mouth every 4 (four) hours as needed for mild pain (or temp > 37.5 C (99.5 F)).    ? albuterol (VENTOLIN HFA) 108 (90 Base) MCG/ACT inhaler INHALE 2 PUFFS BY MOUTH EVERY 4 HOURS AS NEEDED 18 g 12  ? apixaban (ELIQUIS) 5 MG TABS tablet Take 1 tablet (5 mg total) by mouth 2 (two) times daily. 60 tablet 0  ? atorvastatin (LIPITOR) 20 MG tablet Take 1 tablet (20 mg total) by mouth daily. 90 tablet 3  ? cholecalciferol (VITAMIN D) 1000 units tablet Take 1,000 Units by mouth daily.    ? Fluticasone-Salmeterol (WIXELA INHUB) 250-50 MCG/DOSE AEPB Inhale 1 puff into the lungs 2 (two) times daily. 60 each 10  ? letrozole (FEMARA) 2.5 MG tablet Take 1 tablet by mouth once daily (Patient taking differently: Take 2.5 mg by mouth at bedtime.) 90 tablet 3  ? Multiple Vitamin (MULTIVITAMIN) tablet Take 1 tablet by mouth daily.    ? pantoprazole (PROTONIX) 40 MG tablet Take 1 tablet (40 mg total) by mouth daily. 90 tablet 3  ? ?Current Facility-Administered Medications on File Prior to Visit  ?Medication Dose Route Frequency Provider Last Rate Last Admin  ? Mepolizumab SOLR 100 mg  100 mg Subcutaneous Q28 days Baird Lyons D, MD   100 mg at 12/20/17 1455  ? ? ?Allergies:   ?Allergies  ?Allergen Reactions  ?  Clarithromycin Anaphylaxis  ?  "just about killed me"  ? Ibuprofen Hives  ?  Other reaction(s): Unknown  ? Methotrexate   ?  Other reaction(s): tongue swelling and mouth soreness  ? Codeine Nausea Only  ?  Other re

## 2021-09-10 NOTE — Patient Instructions (Signed)
Below is our plan: ? ?Stoke: embolic shower likely due to embolism source with atrial fibrillation on Xarelto : Residual deficit: left sided weakness. Continue Eliquis (apixaban) daily  and atorvastatin  for secondary stroke prevention.  Discussed secondary stroke prevention measures and importance of close PCP follow up for aggressive stroke risk factor management. I have gone over the pathophysiology of stroke, warning signs and symptoms, risk factors and their management in some detail with instructions to go to the closest emergency room for symptoms of concern. ?HTN: BP goal <130/90. 155/79 today.  Continue to follow PCP ?HLD: LDL goal <70. Recent LDL 109. Continue atorvastatin '20mg'$  daily per PCP.  ?DMII: A1c goal<7.0. Recent A1c 5.1.  ? ?Please make sure you are staying well hydrated. I recommend 50-60 ounces daily. Well balanced diet and regular exercise encouraged. Consistent sleep schedule with 6-8 hours recommended.  ? ?Please continue follow up with care team as directed.  ? ?Follow up with me in 6 months  ? ?You may receive a survey regarding today's visit. I encourage you to leave honest feed back as I do use this information to improve patient care. Thank you for seeing me today!  ? ? ?

## 2021-09-14 NOTE — Addendum Note (Signed)
Addended byUbaldo Glassing, Aryahi Denzler L on: 09/14/2021 11:42 AM ? ? Modules accepted: Level of Service ? ?

## 2021-09-16 DIAGNOSIS — I6340 Cerebral infarction due to embolism of unspecified cerebral artery: Secondary | ICD-10-CM | POA: Diagnosis not present

## 2021-09-21 DIAGNOSIS — I6340 Cerebral infarction due to embolism of unspecified cerebral artery: Secondary | ICD-10-CM | POA: Diagnosis not present

## 2021-09-22 ENCOUNTER — Encounter: Payer: Self-pay | Admitting: Internal Medicine

## 2021-09-23 DIAGNOSIS — I6340 Cerebral infarction due to embolism of unspecified cerebral artery: Secondary | ICD-10-CM | POA: Diagnosis not present

## 2021-09-28 DIAGNOSIS — I6340 Cerebral infarction due to embolism of unspecified cerebral artery: Secondary | ICD-10-CM | POA: Diagnosis not present

## 2021-09-30 DIAGNOSIS — I6340 Cerebral infarction due to embolism of unspecified cerebral artery: Secondary | ICD-10-CM | POA: Diagnosis not present

## 2021-10-01 ENCOUNTER — Other Ambulatory Visit: Payer: Self-pay | Admitting: Hematology and Oncology

## 2021-10-05 ENCOUNTER — Other Ambulatory Visit: Payer: Self-pay | Admitting: *Deleted

## 2021-10-05 ENCOUNTER — Telehealth: Payer: Self-pay | Admitting: Hematology and Oncology

## 2021-10-05 ENCOUNTER — Encounter: Payer: Self-pay | Admitting: Hematology and Oncology

## 2021-10-05 DIAGNOSIS — I6340 Cerebral infarction due to embolism of unspecified cerebral artery: Secondary | ICD-10-CM | POA: Diagnosis not present

## 2021-10-05 MED ORDER — LETROZOLE 2.5 MG PO TABS: 2.5000 mg | ORAL_TABLET | Freq: Every day | ORAL | 0 refills | Status: DC

## 2021-10-05 NOTE — Telephone Encounter (Signed)
Scheduled per 05/11 scheduled message, patient has been called and notified. ?

## 2021-10-07 DIAGNOSIS — I6340 Cerebral infarction due to embolism of unspecified cerebral artery: Secondary | ICD-10-CM | POA: Diagnosis not present

## 2021-10-13 DIAGNOSIS — I6340 Cerebral infarction due to embolism of unspecified cerebral artery: Secondary | ICD-10-CM | POA: Diagnosis not present

## 2021-10-15 DIAGNOSIS — I6340 Cerebral infarction due to embolism of unspecified cerebral artery: Secondary | ICD-10-CM | POA: Diagnosis not present

## 2021-10-20 DIAGNOSIS — I6340 Cerebral infarction due to embolism of unspecified cerebral artery: Secondary | ICD-10-CM | POA: Diagnosis not present

## 2021-10-21 ENCOUNTER — Ambulatory Visit (INDEPENDENT_AMBULATORY_CARE_PROVIDER_SITE_OTHER): Payer: Medicare Other | Admitting: Family Medicine

## 2021-10-21 ENCOUNTER — Encounter: Payer: Self-pay | Admitting: Family Medicine

## 2021-10-21 VITALS — BP 148/86 | HR 74 | Temp 97.2°F | Ht 71.0 in | Wt 195.0 lb

## 2021-10-21 DIAGNOSIS — L280 Lichen simplex chronicus: Secondary | ICD-10-CM | POA: Insufficient documentation

## 2021-10-21 DIAGNOSIS — J449 Chronic obstructive pulmonary disease, unspecified: Secondary | ICD-10-CM | POA: Diagnosis not present

## 2021-10-21 DIAGNOSIS — L298 Other pruritus: Secondary | ICD-10-CM

## 2021-10-21 DIAGNOSIS — L570 Actinic keratosis: Secondary | ICD-10-CM | POA: Insufficient documentation

## 2021-10-21 DIAGNOSIS — J302 Other seasonal allergic rhinitis: Secondary | ICD-10-CM | POA: Insufficient documentation

## 2021-10-21 DIAGNOSIS — L209 Atopic dermatitis, unspecified: Secondary | ICD-10-CM | POA: Insufficient documentation

## 2021-10-21 LAB — CBC WITH DIFFERENTIAL/PLATELET
Basophils Absolute: 0 10*3/uL (ref 0.0–0.1)
Basophils Relative: 0.6 % (ref 0.0–3.0)
Eosinophils Absolute: 0.2 10*3/uL (ref 0.0–0.7)
Eosinophils Relative: 2.3 % (ref 0.0–5.0)
HCT: 44.3 % (ref 36.0–46.0)
Hemoglobin: 14.5 g/dL (ref 12.0–15.0)
Lymphocytes Relative: 17.1 % (ref 12.0–46.0)
Lymphs Abs: 1.4 10*3/uL (ref 0.7–4.0)
MCHC: 32.7 g/dL (ref 30.0–36.0)
MCV: 89.6 fl (ref 78.0–100.0)
Monocytes Absolute: 0.9 10*3/uL (ref 0.1–1.0)
Monocytes Relative: 10.1 % (ref 3.0–12.0)
Neutro Abs: 5.9 10*3/uL (ref 1.4–7.7)
Neutrophils Relative %: 69.9 % (ref 43.0–77.0)
Platelets: 251 10*3/uL (ref 150.0–400.0)
RBC: 4.95 Mil/uL (ref 3.87–5.11)
RDW: 15.7 % — ABNORMAL HIGH (ref 11.5–15.5)
WBC: 8.5 10*3/uL (ref 4.0–10.5)

## 2021-10-21 LAB — COMPREHENSIVE METABOLIC PANEL
ALT: 15 U/L (ref 0–35)
AST: 20 U/L (ref 0–37)
Albumin: 4.2 g/dL (ref 3.5–5.2)
Alkaline Phosphatase: 78 U/L (ref 39–117)
BUN: 16 mg/dL (ref 6–23)
CO2: 30 mEq/L (ref 19–32)
Calcium: 9.9 mg/dL (ref 8.4–10.5)
Chloride: 105 mEq/L (ref 96–112)
Creatinine, Ser: 0.8 mg/dL (ref 0.40–1.20)
GFR: 69.27 mL/min (ref >60.00)
Glucose, Bld: 78 mg/dL (ref 70–99)
Potassium: 4.5 mEq/L (ref 3.5–5.1)
Sodium: 143 mEq/L (ref 135–145)
Total Bilirubin: 0.6 mg/dL (ref 0.2–1.2)
Total Protein: 7.2 g/dL (ref 6.0–8.3)

## 2021-10-21 LAB — TSH: TSH: 2.2 u[IU]/mL (ref 0.35–5.50)

## 2021-10-21 MED ORDER — PREDNISONE 10 MG (21) PO TBPK: ORAL_TABLET | Freq: Every day | ORAL | 0 refills | Status: DC

## 2021-10-21 NOTE — Progress Notes (Signed)
Subjective:     Patient ID: Laura Mendez, female    DOB: [birth year], 81 y.o.   MRN: [id number]  Chief Complaint  Patient presents with   Rash    Itchy rash all over body has been going on for 4-5 weeks. Not sure of the cause but states starts as a blister then eventually spreads.     HPI Patient is in today for a 4-5 week history of pruritic rash all over. She has a history of the same and has been under the care of dermatologist in past. She has not seen her dermatologist for new onset of rash.  Hx of atopic dermatitis  and lichenoid eczema.   She has been on several injectable medications for allergies, asthma and COPD. She was taking Dupixent and her sister was giving her the injections.   Under the care of pulmonologist.     Health Maintenance Due  Topic Date Due   Zoster Vaccines- Shingrix (2 of 2) 12/30/2020    Past Medical History:  Diagnosis Date   Asthma    Chronic airway obstruction, not elsewhere classified    Diastolic dysfunction without heart failure    Disorder of bone and cartilage, unspecified    Dysrhythmia    A-Fib   Family history of colon cancer    Family history of kidney cancer    Family history of ovarian cancer    Paroxysmal atrial flutter (HCC)    Stroke ([place])    TIA   TIA (transient ischemic attack)    Unspecified asthma(493.90)     Past Surgical History:  Procedure Laterality Date   ABDOMINAL HYSTERECTOMY     BREAST LUMPECTOMY Right 12/30/2017   BREAST LUMPECTOMY WITH RADIOACTIVE SEED LOCALIZATION Right 12/30/2017   Procedure: BREAST LUMPECTOMY WITH RADIOACTIVE SEED LOCALIZATION X'S 3;  Surgeon: Rolm Bookbinder, MD;  Location: [facility];  Service: General;  Laterality: Right;   BUNIONECTOMY Bilateral    EYE SURGERY Bilateral    cataract removal    Family History  Problem Relation Age of Onset   COPD Father    Colon cancer Mother 11       mets to pancreas and liver   Kidney cancer Sister 88       d. 38   Lung cancer  Sister        d. 86, smoker   Ovarian cancer Sister 38       d. 36   Cirrhosis Sister        d. 49   Melanoma Sister 56   Ovarian cancer Maternal Grandmother        d. 52   Heart disease Maternal Grandfather        rheumatic heart disease   Rectal cancer Paternal Grandfather        d. 90   Parkinson's disease Brother    Ovarian cancer Maternal Aunt    Colon cancer Maternal Uncle        d. 72-73   Heart attack Paternal Uncle        d. 40   Kidney cancer Brother 49   Colon cancer Maternal Uncle        d. 48   Cancer Other        MGMs pat 1/2 sister with uterine and rectal cancer   Colon cancer Cousin        d. 60 - mat first cousin   Breast cancer Neg Hx     Social History   Socioeconomic History  Marital status: Divorced    Spouse name: Not on file   Number of children: Not on file   Years of education: Not on file   Highest education level: Not on file  Occupational History   Occupation: retired  Tobacco Use   Smoking status: Former    Years: 0.00    Types: Cigarettes    Quit date: 05/24/1977    Years since quitting: 44.4   Smokeless tobacco: Never   Tobacco comments:    7/7 pt states she occasionally smoked, let cigs "burn" more than she smoked them  Vaping Use   Vaping Use: Never used  Substance and Sexual Activity   Alcohol use: No   Drug use: No   Sexual activity: Not on file  Other Topics Concern   Not on file  Social History Narrative   Right handed   1 coke per day   Lives alone   Social Determinants of Health   Financial Resource Strain: Not on file  Food Insecurity: Not on file  Transportation Needs: Not on file  Physical Activity: Not on file  Stress: Not on file  Social Connections: Not on file  Intimate Partner Violence: Not on file    Outpatient Medications Prior to Visit  Medication Sig Dispense Refill   acetaminophen (TYLENOL) 325 MG tablet Take 2 tablets (650 mg total) by mouth every 4 (four) hours as needed for mild pain (or  temp > 37.5 C (99.5 F)).     albuterol (VENTOLIN HFA) 108 (90 Base) MCG/ACT inhaler INHALE 2 PUFFS BY MOUTH EVERY 4 HOURS AS NEEDED 18 g 12   apixaban (ELIQUIS) 5 MG TABS tablet Take 1 tablet (5 mg total) by mouth 2 (two) times daily. 60 tablet 0   cholecalciferol (VITAMIN D) 1000 units tablet Take 1,000 Units by mouth daily.     Fluticasone-Salmeterol (WIXELA INHUB) 250-50 MCG/DOSE AEPB Inhale 1 puff into the lungs 2 (two) times daily. 60 each 10   letrozole (FEMARA) 2.5 MG tablet Take 1 tablet (2.5 mg total) by mouth daily. 90 tablet 0   Multiple Vitamin (MULTIVITAMIN) tablet Take 1 tablet by mouth daily.     pantoprazole (PROTONIX) 40 MG tablet Take 1 tablet (40 mg total) by mouth daily. 90 tablet 3   atorvastatin (LIPITOR) 20 MG tablet Take 1 tablet (20 mg total) by mouth daily. 90 tablet 3   Facility-Administered Medications Prior to Visit  Medication Dose Route Frequency Provider Last Rate Last Admin   Mepolizumab SOLR 100 mg  100 mg Subcutaneous Q28 days Young, Clinton D, MD   100 mg at 12/20/17 1455    Allergies  Allergen Reactions   Clarithromycin Anaphylaxis    "just about killed me"   Ibuprofen Hives    Other reaction(s): Unknown   Methotrexate     Other reaction(s): tongue swelling and mouth soreness   Codeine Nausea Only    Other reaction(s): Unknown   Codeine Phosphate Nausea Only   Diltiazem Rash   Tape Itching and Other (See Comments)    Tears skin   Tapentadol Itching    Other reaction(s): Other (See Comments) Tears skin   Verapamil Hcl Er Rash    ROS     Objective:    Physical Exam Constitutional:      General: She is not in acute distress.    Appearance: She is not ill-appearing.  Cardiovascular:     Rate and Rhythm: Normal rate.  Pulmonary:     Effort: Pulmonary effort is  normal.  Skin:    General: Skin is warm and dry.     Findings: Rash present.     Comments: Numerous areas of pruritic, red, raised areas, keratosis and urticaria type lesions on  her chest, back, abdomen, bilateral upper and lower extremities with excoriation noted. No sign of secondary infection   Neurological:     Mental Status: She is alert.    BP (!) 148/86 (BP Location: Right Arm, Patient Position: Sitting, Cuff Size: Normal)   Pulse 74   Temp (!) 97.2 F (36.2 C) (Temporal)   Ht '5\' 11"'$  (1.803 m)   Wt 195 lb (88.5 kg)   SpO2 96%   BMI 27.20 kg/m  Wt Readings from Last 3 Encounters:  10/21/21 195 lb (88.5 kg)  09/10/21 195 lb 9.6 oz (88.7 kg)  09/08/21 190 lb (86.2 kg)       Assessment & Plan:   Problem List Items Addressed This Visit       Respiratory   Asthma with COPD ([place])   Relevant Medications   predniSONE (STERAPRED UNI-PAK 21 TAB) 10 MG (21) TBPK tablet   Other Visit Diagnoses     Pruritic erythematous rash    -  Primary   Relevant Medications   predniSONE (STERAPRED UNI-PAK 21 TAB) 10 MG (21) TBPK tablet   Other Relevant Orders   CBC with Differential/Platelet (Completed)   Comprehensive metabolic panel (Completed)   TSH (Completed)      Reviewed notes from pulmonologist and dermatologist. Steroid dose pak prescribed. Check labs and advised her follow up with dermatologist.   I have discontinued Braxton C. Montano's atorvastatin. I am also having her start on predniSONE. Additionally, I am having her maintain her multivitamin, cholecalciferol, Fluticasone-Salmeterol, apixaban, acetaminophen, albuterol, pantoprazole, and letrozole. We will continue to administer mepolizumab.  Meds ordered this encounter  Medications   predniSONE (STERAPRED UNI-PAK 21 TAB) 10 MG (21) TBPK tablet    Sig: Take by mouth daily.    Dispense:  21 tablet    Refill:  0    Order Specific Question:   Supervising Provider    Answer:   Pricilla Holm A [[id number]]

## 2021-10-21 NOTE — Patient Instructions (Addendum)
Go to the lab on first floor before you leave.   Take the steroid dose pak.   Follow up with your dermatologist.

## 2021-10-22 ENCOUNTER — Other Ambulatory Visit: Payer: Self-pay | Admitting: Internal Medicine

## 2021-10-22 DIAGNOSIS — Z1231 Encounter for screening mammogram for malignant neoplasm of breast: Secondary | ICD-10-CM

## 2021-10-22 DIAGNOSIS — I6340 Cerebral infarction due to embolism of unspecified cerebral artery: Secondary | ICD-10-CM | POA: Diagnosis not present

## 2021-10-27 DIAGNOSIS — I6340 Cerebral infarction due to embolism of unspecified cerebral artery: Secondary | ICD-10-CM | POA: Diagnosis not present

## 2021-11-10 DIAGNOSIS — I6340 Cerebral infarction due to embolism of unspecified cerebral artery: Secondary | ICD-10-CM | POA: Diagnosis not present

## 2021-11-16 ENCOUNTER — Ambulatory Visit (INDEPENDENT_AMBULATORY_CARE_PROVIDER_SITE_OTHER): Payer: Medicare Other

## 2021-11-16 DIAGNOSIS — Z0000 Encounter for general adult medical examination without abnormal findings: Secondary | ICD-10-CM | POA: Diagnosis not present

## 2021-11-17 DIAGNOSIS — I6340 Cerebral infarction due to embolism of unspecified cerebral artery: Secondary | ICD-10-CM | POA: Diagnosis not present

## 2021-11-18 NOTE — Assessment & Plan Note (Signed)
Hospitalization: 11/29/27-5/62/13: Embolic stroke  0/12/6576:IONGE lumpectomy: IDC with calcifications grade 1, 0.6 cm, ADH, margins negative, ER 100%, PR 90%, HER-2 negative ratio 1.77, Ki-67 1%, T1 be N0 stage Ia Did not need adjuvant radiation because of favorable tumor characteristics and age over 84  Current treatment: Letrozole 2.5 mg daily started August 2019 Letrozole toxicities: Denies any adverse effects letrozole.  Breast cancer surveillance: 1.Breast exam 11/18/21: Benign 2.Mammogram:scheduled for 12/08/21  RTC in 1 year

## 2021-11-19 ENCOUNTER — Inpatient Hospital Stay: Payer: Medicare Other | Attending: Hematology and Oncology | Admitting: Hematology and Oncology

## 2021-11-19 ENCOUNTER — Other Ambulatory Visit: Payer: Self-pay

## 2021-11-19 DIAGNOSIS — Z7952 Long term (current) use of systemic steroids: Secondary | ICD-10-CM | POA: Diagnosis not present

## 2021-11-19 DIAGNOSIS — Z79899 Other long term (current) drug therapy: Secondary | ICD-10-CM | POA: Insufficient documentation

## 2021-11-19 DIAGNOSIS — R21 Rash and other nonspecific skin eruption: Secondary | ICD-10-CM | POA: Insufficient documentation

## 2021-11-19 DIAGNOSIS — Z7951 Long term (current) use of inhaled steroids: Secondary | ICD-10-CM | POA: Diagnosis not present

## 2021-11-19 DIAGNOSIS — Z7901 Long term (current) use of anticoagulants: Secondary | ICD-10-CM | POA: Diagnosis not present

## 2021-11-19 DIAGNOSIS — Z79811 Long term (current) use of aromatase inhibitors: Secondary | ICD-10-CM | POA: Insufficient documentation

## 2021-11-19 DIAGNOSIS — C50511 Malignant neoplasm of lower-outer quadrant of right female breast: Secondary | ICD-10-CM | POA: Diagnosis not present

## 2021-11-19 DIAGNOSIS — Z170 Estrogen receptor positive status [ER+]: Secondary | ICD-10-CM | POA: Diagnosis not present

## 2021-11-19 NOTE — Progress Notes (Signed)
Patient Care Team: [name], MD as PCP - General (Internal Medicine) Jake Bathe, MD as PCP - Cardiology (Cardiology) Emelia Loron, MD as Consulting Physician (General Surgery) Serena Croissant, MD as Consulting Physician (Hematology and Oncology) Antony Blackbird, MD as Consulting Physician (Radiation Oncology)  DIAGNOSIS:  Encounter Diagnosis  Name Primary?   Malignant neoplasm of lower-outer quadrant of right breast of female, estrogen receptor positive (HCC)     SUMMARY OF ONCOLOGIC HISTORY: Oncology History  Malignant neoplasm of lower-outer quadrant of right breast of female, estrogen receptor positive (HCC)  11/10/2017 Initial Diagnosis   Six-month follow-up of right breast masses: Anterior mass unchanged and posterior mass showed spiculation.  Biopsy attempt was made on posterior mass but accidentally the anterior mass was actually biopsied.  It revealed grade 1 invasive ductal carcinoma ER 100%, PR 100%, Ki-67 1%, HER-2 negative ratio 1.77; second attempt to biopsy the posterior mass resulted in the biopsy of an intermediate zone between the 2 masses that revealed ADH and ALH, (posterior mass still needs to be biopsied) T1 a N0 stage I a clinical stage   12/30/2017 Surgery   Right lumpectomy: IDC grade 1, 0.6 cm, ADH, ER 100%, PR 100%, HER-2 negative ratio 1.77, Ki-67 1%, margins negative.  Lymph nodes not evaluated.  T1b N0 stage Ia   12/30/2017 Surgery   Right lumpectomy: IDC with calcifications grade 1, 0.6 cm, ADH, margins negative, ER 100%, PR 90%, HER-2 negative ratio 1.77, Ki-67 1%, T1 be N0 stage Ia    01/05/2018 Genetic Testing   Negative genetic testing on the multicancer panel.  The Multi-Gene Panel offered by [facility] includes sequencing and/or deletion duplication testing of the following 84 genes: AIP, ALK, APC, ATM, AXIN2,BAP1,  BARD1, BLM, BMPR1A, BRCA1, BRCA2, BRIP1, CASR, CDC73, CDH1, CDK4, CDKN1B, CDKN1C, CDKN2A (p14ARF), CDKN2A (p16INK4a), CEBPA,  CHEK2, CTNNA1, DICER1, DIS3L2, EGFR (c.2369C>T, p.Thr790Met variant only), EPCAM (Deletion/duplication testing only), FH, FLCN, GATA2, GPC3, GREM1 (Promoter region deletion/duplication testing only), HOXB13 (c.251G>A, p.Gly84Glu), HRAS, KIT, MAX, MEN1, MET, MITF (c.952G>A, p.Glu318Lys variant only), MLH1, MSH2, MSH3, MSH6, MUTYH, NBN, NF1, NF2, NTHL1, PALB2, PDGFRA, PHOX2B, PMS2, POLD1, POLE, POT1, PRKAR1A, PTCH1, PTEN, RAD50, RAD51C, RAD51D, RB1, RECQL4, RET, RUNX1, SDHAF2, SDHA (sequence changes only), SDHB, SDHC, SDHD, SMAD4, SMARCA4, SMARCB1, SMARCE1, STK11, SUFU, TERC, TERT, TMEM127, TP53, TSC1, TSC2, VHL, WRN and WT1.  The report date is January 05, 2018.   01/11/2018 Cancer Staging   Staging form: Breast, AJCC 8th Edition - Pathologic: Stage IA (pT1b, pN0, cM0, G1, ER+, PR+, HER2-) - Signed by Loa Socks, NP on 01/11/2018   01/12/2018 -  Anti-estrogen oral therapy   Letrozole 2.5 mg daily     CHIEF COMPLIANT: Follow-up on Letrozole  INTERVAL HISTORY: Laura Mendez is a 81 y.o with the above mention. She presents to the clinic today for a follow-up. States that she had a stroke and it has affected her memory. States that now she has to use a cane for balance while she out. States that she has chronic asthma so she doesn't go into grocery stores. Denies pain and discomfort in breast. Complains of a rash that she states that they doesn't know what it is. States that she puts cream on it.   ALLERGIES:  is allergic to clarithromycin, ibuprofen, methotrexate, codeine, codeine phosphate, diltiazem, tape, tapentadol, and verapamil hcl er.  MEDICATIONS:  Current Outpatient Medications  Medication Sig Dispense Refill   acetaminophen (TYLENOL) 325 MG tablet Take 2 tablets (650 mg total) by  mouth every 4 (four) hours as needed for mild pain (or temp > 37.5 C (99.5 F)).     albuterol (VENTOLIN HFA) 108 (90 Base) MCG/ACT inhaler INHALE 2 PUFFS BY MOUTH EVERY 4 HOURS AS NEEDED 18 g  12   apixaban (ELIQUIS) 5 MG TABS tablet Take 1 tablet (5 mg total) by mouth 2 (two) times daily. 60 tablet 0   cholecalciferol (VITAMIN D) 1000 units tablet Take 1,000 Units by mouth daily.     Fluticasone-Salmeterol (WIXELA INHUB) 250-50 MCG/DOSE AEPB Inhale 1 puff into the lungs 2 (two) times daily. 60 each 10   letrozole (FEMARA) 2.5 MG tablet Take 1 tablet (2.5 mg total) by mouth daily. 90 tablet 0   Multiple Vitamin (MULTIVITAMIN) tablet Take 1 tablet by mouth daily.     pantoprazole (PROTONIX) 40 MG tablet Take 1 tablet (40 mg total) by mouth daily. 90 tablet 3   predniSONE (STERAPRED UNI-PAK 21 TAB) 10 MG (21) TBPK tablet Take by mouth daily. (Patient not taking: Reported on 11/16/2021) 21 tablet 0   Current Facility-Administered Medications  Medication Dose Route Frequency Provider Last Rate Last Admin   Mepolizumab SOLR 100 mg  100 mg Subcutaneous Q28 days Baird Lyons D, MD   100 mg at 12/20/17 1455    PHYSICAL EXAMINATION: ECOG PERFORMANCE STATUS: 1 - Symptomatic but completely ambulatory  Vitals:   11/19/21 1108  BP: (!) 150/86  Pulse: 85  Resp: 18  Temp: 97.9 F (36.6 C)  SpO2: 96%   Filed Weights   11/19/21 1108  Weight: 199 lb 14.4 oz (90.7 kg)      LABORATORY DATA:  I have reviewed the data as listed    Latest Ref Rng & Units 10/21/2021   11:54 AM 07/02/2021    5:04 AM 06/29/2021    2:30 AM  CMP  Glucose 70 - 99 mg/dL 78  112    BUN 6 - 23 mg/dL 16  8    Creatinine 0.40 - 1.20 mg/dL 0.80  0.81    Sodium 135 - 145 mEq/L 143  140    Potassium 3.5 - 5.1 mEq/L 4.5  3.6    Chloride 96 - 112 mEq/L 105  108    CO2 19 - 32 mEq/L 30  24    Calcium 8.4 - 10.5 mg/dL 9.9  8.7    Total Protein 6.0 - 8.3 g/dL 7.2  6.0  6.5   Total Bilirubin 0.2 - 1.2 mg/dL 0.6  0.4  0.7   Alkaline Phos 39 - 117 U/L 78  45  56   AST 0 - 37 U/L 20  33  30   ALT 0 - 35 U/L _0 Lab Results  Component Value Date   WBC 8.5 10/21/2021   HGB 14.5 10/21/2021   HCT 44.3  10/21/2021   MCV 89.6 10/21/2021   PLT 251.0 10/21/2021   NEUTROABS 5.9 10/21/2021    ASSESSMENT & PLAN:  Malignant neoplasm of lower-outer quadrant of right breast of female, estrogen receptor positive ([place]) Hospitalization: 06/02/39-7/40/81: Embolic stroke  08/26/8183:[facility] lumpectomy: IDC with calcifications grade 1, 0.6 cm, ADH, margins negative, ER 100%, PR 90%, HER-2 negative ratio 1.77, Ki-67 1%, T1 be N0 stage Ia Did not need adjuvant radiation because of favorable tumor characteristics and age over 35   Current treatment: Letrozole 2.5 mg daily started August 2019 Letrozole toxicities: Denies any adverse effects letrozole.   Breast cancer surveillance: 1.  Breast exam  11/18/21: Benign 2.  Mammogram:scheduled for 12/08/21  RTC in 1 year   No orders of the defined types were placed in this encounter.  The patient has a good understanding of the overall plan. she agrees with it. she will call with any problems that may develop before the next visit here. Total time spent: 30 mins including face to face time and time spent for planning, charting and co-ordination of care   Harriette Ohara, MD 11/19/21    I Gardiner Coins am scribing for Dr. Lindi Adie  I have reviewed the above documentation for accuracy and completeness, and I agree with the above.

## 2021-11-20 ENCOUNTER — Telehealth: Payer: Self-pay | Admitting: Hematology and Oncology

## 2021-11-20 ENCOUNTER — Other Ambulatory Visit: Payer: Self-pay | Admitting: Internal Medicine

## 2021-11-20 DIAGNOSIS — M25511 Pain in right shoulder: Secondary | ICD-10-CM | POA: Diagnosis not present

## 2021-11-20 MED ORDER — APIXABAN 5 MG PO TABS: 5.0000 mg | ORAL_TABLET | Freq: Two times a day (BID) | ORAL | 0 refills | Status: DC

## 2021-11-20 NOTE — Telephone Encounter (Signed)
Scheduled appointment per 6/29 los. Left message.

## 2021-11-20 NOTE — Telephone Encounter (Signed)
Caller & Relationship to patient: Cris Gibby  Call back number: [phone]  Date of last office visit: 12/22/20  Date of next office visit:   Medication(s) to be refilled: apixaban Arne Cleveland) 5 MG TABS tablet       Preferred Pharmacy:  [facility], [place] Phone:  [phone]  Fax:  [phone]

## 2021-11-21 ENCOUNTER — Encounter: Payer: Self-pay | Admitting: Cardiology

## 2021-11-21 ENCOUNTER — Other Ambulatory Visit: Payer: Self-pay | Admitting: Internal Medicine

## 2021-11-22 ENCOUNTER — Observation Stay (HOSPITAL_COMMUNITY)
Admission: EM | Admit: 2021-11-22 | Discharge: 2021-11-24 | Disposition: A | Discharge status: Home / Self Care | Payer: Medicare Other | Attending: Internal Medicine | Admitting: Internal Medicine

## 2021-11-22 DIAGNOSIS — R739 Hyperglycemia, unspecified: Secondary | ICD-10-CM | POA: Diagnosis not present

## 2021-11-22 DIAGNOSIS — R918 Other nonspecific abnormal finding of lung field: Secondary | ICD-10-CM | POA: Diagnosis not present

## 2021-11-22 DIAGNOSIS — J449 Chronic obstructive pulmonary disease, unspecified: Secondary | ICD-10-CM | POA: Insufficient documentation

## 2021-11-22 DIAGNOSIS — R778 Other specified abnormalities of plasma proteins: Secondary | ICD-10-CM | POA: Diagnosis not present

## 2021-11-22 DIAGNOSIS — I214 Non-ST elevation (NSTEMI) myocardial infarction: Secondary | ICD-10-CM | POA: Diagnosis not present

## 2021-11-22 DIAGNOSIS — Z79899 Other long term (current) drug therapy: Secondary | ICD-10-CM | POA: Diagnosis not present

## 2021-11-22 DIAGNOSIS — Z7901 Long term (current) use of anticoagulants: Secondary | ICD-10-CM | POA: Insufficient documentation

## 2021-11-22 DIAGNOSIS — R079 Chest pain, unspecified: Secondary | ICD-10-CM | POA: Diagnosis present

## 2021-11-22 DIAGNOSIS — J45909 Unspecified asthma, uncomplicated: Secondary | ICD-10-CM | POA: Diagnosis not present

## 2021-11-22 DIAGNOSIS — N179 Acute kidney failure, unspecified: Secondary | ICD-10-CM | POA: Diagnosis not present

## 2021-11-22 DIAGNOSIS — Z8673 Personal history of transient ischemic attack (TIA), and cerebral infarction without residual deficits: Secondary | ICD-10-CM | POA: Insufficient documentation

## 2021-11-22 DIAGNOSIS — I480 Paroxysmal atrial fibrillation: Secondary | ICD-10-CM | POA: Diagnosis not present

## 2021-11-22 DIAGNOSIS — Z170 Estrogen receptor positive status [ER+]: Secondary | ICD-10-CM | POA: Insufficient documentation

## 2021-11-22 DIAGNOSIS — Z853 Personal history of malignant neoplasm of breast: Secondary | ICD-10-CM | POA: Diagnosis not present

## 2021-11-22 DIAGNOSIS — N182 Chronic kidney disease, stage 2 (mild): Secondary | ICD-10-CM | POA: Insufficient documentation

## 2021-11-22 DIAGNOSIS — J4489 Other specified chronic obstructive pulmonary disease: Secondary | ICD-10-CM | POA: Diagnosis present

## 2021-11-22 DIAGNOSIS — I5032 Chronic diastolic (congestive) heart failure: Secondary | ICD-10-CM | POA: Diagnosis present

## 2021-11-22 DIAGNOSIS — Z87891 Personal history of nicotine dependence: Secondary | ICD-10-CM | POA: Insufficient documentation

## 2021-11-23 ENCOUNTER — Ambulatory Visit (HOSPITAL_COMMUNITY)
Admission: EM | Disposition: A | Discharge status: Home / Self Care | Payer: Self-pay | Source: Home / Self Care | Attending: Emergency Medicine

## 2021-11-23 ENCOUNTER — Inpatient Hospital Stay (HOSPITAL_COMMUNITY): Payer: Medicare Other

## 2021-11-23 ENCOUNTER — Encounter (HOSPITAL_COMMUNITY): Payer: Self-pay

## 2021-11-23 ENCOUNTER — Other Ambulatory Visit: Payer: Self-pay

## 2021-11-23 ENCOUNTER — Emergency Department (HOSPITAL_COMMUNITY): Payer: Medicare Other

## 2021-11-23 DIAGNOSIS — I5032 Chronic diastolic (congestive) heart failure: Secondary | ICD-10-CM

## 2021-11-23 DIAGNOSIS — I214 Non-ST elevation (NSTEMI) myocardial infarction: Secondary | ICD-10-CM

## 2021-11-23 DIAGNOSIS — J449 Chronic obstructive pulmonary disease, unspecified: Secondary | ICD-10-CM | POA: Diagnosis not present

## 2021-11-23 DIAGNOSIS — R918 Other nonspecific abnormal finding of lung field: Secondary | ICD-10-CM | POA: Diagnosis not present

## 2021-11-23 DIAGNOSIS — R778 Other specified abnormalities of plasma proteins: Secondary | ICD-10-CM | POA: Diagnosis not present

## 2021-11-23 DIAGNOSIS — I480 Paroxysmal atrial fibrillation: Secondary | ICD-10-CM

## 2021-11-23 DIAGNOSIS — N179 Acute kidney failure, unspecified: Secondary | ICD-10-CM | POA: Diagnosis not present

## 2021-11-23 DIAGNOSIS — N182 Chronic kidney disease, stage 2 (mild): Secondary | ICD-10-CM | POA: Diagnosis not present

## 2021-11-23 DIAGNOSIS — R7989 Other specified abnormal findings of blood chemistry: Secondary | ICD-10-CM | POA: Insufficient documentation

## 2021-11-23 HISTORY — PX: LEFT HEART CATH AND CORONARY ANGIOGRAPHY: CATH118249

## 2021-11-23 LAB — HEMOGLOBIN A1C
Hgb A1c MFr Bld: 5.9 % — ABNORMAL HIGH (ref 4.8–5.6)
Mean Plasma Glucose: 122.63 mg/dL

## 2021-11-23 LAB — CBC
HCT: 45.6 % (ref 36.0–46.0)
Hemoglobin: 15.4 g/dL — ABNORMAL HIGH (ref 12.0–15.0)
MCH: 31.1 pg (ref 26.0–34.0)
MCHC: 33.8 g/dL (ref 30.0–36.0)
MCV: 92.1 fL (ref 80.0–100.0)
Platelets: 401 10*3/uL — ABNORMAL HIGH (ref 150–400)
RBC: 4.95 MIL/uL (ref 3.87–5.11)
RDW: 14.7 % (ref 11.5–15.5)
WBC: 14.7 10*3/uL — ABNORMAL HIGH (ref 4.0–10.5)
nRBC: 0 % (ref 0.0–0.2)

## 2021-11-23 LAB — ECHOCARDIOGRAM COMPLETE
AR max vel: 3.46 cm2
AV Area VTI: 3.06 cm2
AV Area mean vel: 3.19 cm2
AV Mean grad: 5 mmHg
AV Peak grad: 10.5 mmHg
Ao pk vel: 1.62 m/s
Area-P 1/2: 1.69 cm2
Height: 70 in
MV M vel: 4.48 m/s
MV Peak grad: 80.3 mmHg
S' Lateral: 3.4 cm
Weight: 3120 oz

## 2021-11-23 LAB — URINALYSIS, COMPLETE (UACMP) WITH MICROSCOPIC
Bacteria, UA: NONE SEEN
Bilirubin Urine: NEGATIVE
Glucose, UA: NEGATIVE mg/dL
Hgb urine dipstick: NEGATIVE
Ketones, ur: NEGATIVE mg/dL
Leukocytes,Ua: NEGATIVE
Nitrite: NEGATIVE
Protein, ur: NEGATIVE mg/dL
Specific Gravity, Urine: 1.013 (ref 1.005–1.030)
pH: 6 (ref 5.0–8.0)

## 2021-11-23 LAB — TROPONIN I (HIGH SENSITIVITY)
Troponin I (High Sensitivity): 50 ng/L — ABNORMAL HIGH (ref <18)
Troponin I (High Sensitivity): 1138 ng/L (ref <18)
Troponin I (High Sensitivity): 4746 ng/L (ref <18)
Troponin I (High Sensitivity): 6542 ng/L (ref <18)

## 2021-11-23 LAB — CBG MONITORING, ED: Glucose-Capillary: 174 mg/dL — ABNORMAL HIGH (ref 70–99)

## 2021-11-23 LAB — CREATININE, URINE, RANDOM: Creatinine, Urine: 72.78 mg/dL

## 2021-11-23 LAB — BASIC METABOLIC PANEL
Anion gap: 15 (ref 5–15)
BUN: 25 mg/dL — ABNORMAL HIGH (ref 8–23)
CO2: 20 mmol/L — ABNORMAL LOW (ref 22–32)
Calcium: 9.4 mg/dL (ref 8.9–10.3)
Chloride: 102 mmol/L (ref 98–111)
Creatinine, Ser: 1.19 mg/dL — ABNORMAL HIGH (ref 0.44–1.00)
GFR, Estimated: 46 mL/min — ABNORMAL LOW (ref >60)
Glucose, Bld: 192 mg/dL — ABNORMAL HIGH (ref 70–99)
Potassium: 4.1 mmol/L (ref 3.5–5.1)
Sodium: 137 mmol/L (ref 135–145)

## 2021-11-23 LAB — GLUCOSE, CAPILLARY: Glucose-Capillary: 124 mg/dL — ABNORMAL HIGH (ref 70–99)

## 2021-11-23 LAB — HEPARIN LEVEL (UNFRACTIONATED): Heparin Unfractionated: >1.1 IU/mL — ABNORMAL HIGH (ref 0.30–0.70)

## 2021-11-23 LAB — APTT: aPTT: 186 seconds (ref 24–36)

## 2021-11-23 LAB — SODIUM, URINE, RANDOM: Sodium, Ur: 55 mmol/L

## 2021-11-23 SURGERY — LEFT HEART CATH AND CORONARY ANGIOGRAPHY
Anesthesia: LOCAL

## 2021-11-23 MED ORDER — MIDAZOLAM HCL 2 MG/2ML IJ SOLN
INTRAMUSCULAR | Status: DC | PRN
  Administered 2021-11-23: 2 mg via INTRAVENOUS

## 2021-11-23 MED ORDER — LIDOCAINE HCL (PF) 1 % IJ SOLN
INTRAMUSCULAR | Status: AC
  Filled 2021-11-23: qty 30

## 2021-11-23 MED ORDER — SODIUM CHLORIDE 0.9 % WEIGHT BASED INFUSION: 1.0000 mL/kg/h | INTRAVENOUS | Status: DC

## 2021-11-23 MED ORDER — SODIUM CHLORIDE 0.9% FLUSH: 3.0000 mL | INTRAVENOUS | Status: DC | PRN

## 2021-11-23 MED ORDER — ATORVASTATIN CALCIUM 80 MG PO TABS
80.0000 mg | ORAL_TABLET | Freq: Every day | ORAL | Status: DC
  Administered 2021-11-23 – 2021-11-24 (×2): 80 mg via ORAL
  Filled 2021-11-23: qty 1
  Filled 2021-11-23: qty 2

## 2021-11-23 MED ORDER — LETROZOLE 2.5 MG PO TABS
2.5000 mg | ORAL_TABLET | Freq: Every day | ORAL | Status: DC
  Administered 2021-11-24: 2.5 mg via ORAL
  Filled 2021-11-23 (×2): qty 1

## 2021-11-23 MED ORDER — SODIUM CHLORIDE 0.9 % IV SOLN: INTRAVENOUS | Status: AC

## 2021-11-23 MED ORDER — HEPARIN (PORCINE) IN NACL 1000-0.9 UT/500ML-% IV SOLN
INTRAVENOUS | Status: AC
  Filled 2021-11-23: qty 1000

## 2021-11-23 MED ORDER — HEPARIN SODIUM (PORCINE) 1000 UNIT/ML IJ SOLN
INTRAMUSCULAR | Status: AC
Start: 2021-11-23 — End: ?
  Filled 2021-11-23: qty 10

## 2021-11-23 MED ORDER — FENTANYL CITRATE (PF) 100 MCG/2ML IJ SOLN
INTRAMUSCULAR | Status: DC | PRN
  Administered 2021-11-23: 25 ug via INTRAVENOUS

## 2021-11-23 MED ORDER — ATORVASTATIN CALCIUM 40 MG PO TABS: 40.0000 mg | ORAL_TABLET | Freq: Every day | ORAL | Status: DC

## 2021-11-23 MED ORDER — VERAPAMIL HCL 2.5 MG/ML IV SOLN
INTRAVENOUS | Status: AC
  Filled 2021-11-23: qty 2

## 2021-11-23 MED ORDER — HEPARIN SODIUM (PORCINE) 1000 UNIT/ML IJ SOLN
INTRAMUSCULAR | Status: DC | PRN
  Administered 2021-11-23: 5000 [IU] via INTRAVENOUS

## 2021-11-23 MED ORDER — ASPIRIN 325 MG PO TABS
325.0000 mg | ORAL_TABLET | Freq: Every day | ORAL | Status: DC
  Filled 2021-11-23: qty 1

## 2021-11-23 MED ORDER — SODIUM CHLORIDE 0.9 % IV SOLN: 250.0000 mL | INTRAVENOUS | Status: DC | PRN

## 2021-11-23 MED ORDER — HYDRALAZINE HCL 20 MG/ML IJ SOLN: 10.0000 mg | INTRAMUSCULAR | Status: AC | PRN

## 2021-11-23 MED ORDER — ALBUTEROL SULFATE (2.5 MG/3ML) 0.083% IN NEBU: 2.5000 mg | INHALATION_SOLUTION | RESPIRATORY_TRACT | Status: DC | PRN

## 2021-11-23 MED ORDER — IOHEXOL 350 MG/ML SOLN
INTRAVENOUS | Status: DC | PRN
  Administered 2021-11-23: 40 mL

## 2021-11-23 MED ORDER — LIDOCAINE HCL (PF) 1 % IJ SOLN
INTRAMUSCULAR | Status: DC | PRN
  Administered 2021-11-23: 2 mL

## 2021-11-23 MED ORDER — ALBUTEROL SULFATE HFA 108 (90 BASE) MCG/ACT IN AERS: 2.0000 | INHALATION_SPRAY | RESPIRATORY_TRACT | Status: DC | PRN

## 2021-11-23 MED ORDER — METOPROLOL TARTRATE 5 MG/5ML IV SOLN: 5.0000 mg | Freq: Once | INTRAVENOUS | Status: DC

## 2021-11-23 MED ORDER — SODIUM CHLORIDE 0.9% FLUSH: 3.0000 mL | Freq: Two times a day (BID) | INTRAVENOUS | Status: DC

## 2021-11-23 MED ORDER — NITROGLYCERIN 1 MG/10 ML FOR IR/CATH LAB
INTRA_ARTERIAL | Status: AC
  Filled 2021-11-23: qty 10

## 2021-11-23 MED ORDER — HEPARIN (PORCINE) 25000 UT/250ML-% IV SOLN
1000.0000 [IU]/h | INTRAVENOUS | Status: DC
  Administered 2021-11-23: 1000 [IU]/h via INTRAVENOUS
  Filled 2021-11-23: qty 250

## 2021-11-23 MED ORDER — MOMETASONE FURO-FORMOTEROL FUM 200-5 MCG/ACT IN AERO
2.0000 | INHALATION_SPRAY | Freq: Two times a day (BID) | RESPIRATORY_TRACT | Status: DC
  Administered 2021-11-23 – 2021-11-24 (×2): 2 via RESPIRATORY_TRACT
  Filled 2021-11-23 (×2): qty 8.8

## 2021-11-23 MED ORDER — NITROGLYCERIN 1 MG/10 ML FOR IR/CATH LAB
INTRA_ARTERIAL | Status: DC | PRN
  Administered 2021-11-23: 200 ug via INTRA_ARTERIAL

## 2021-11-23 MED ORDER — SODIUM CHLORIDE 0.9 % WEIGHT BASED INFUSION: 3.0000 mL/kg/h | INTRAVENOUS | Status: DC

## 2021-11-23 MED ORDER — HEPARIN (PORCINE) IN NACL 1000-0.9 UT/500ML-% IV SOLN
INTRAVENOUS | Status: DC | PRN
  Administered 2021-11-23 (×2): 500 mL

## 2021-11-23 MED ORDER — SODIUM CHLORIDE 0.9% FLUSH
3.0000 mL | Freq: Two times a day (BID) | INTRAVENOUS | Status: DC
  Administered 2021-11-24: 3 mL via INTRAVENOUS

## 2021-11-23 MED ORDER — OXYCODONE HCL 5 MG PO TABS: 5.0000 mg | ORAL_TABLET | Freq: Four times a day (QID) | ORAL | Status: DC | PRN

## 2021-11-23 MED ORDER — ACETAMINOPHEN 325 MG PO TABS: 650.0000 mg | ORAL_TABLET | ORAL | Status: DC | PRN

## 2021-11-23 MED ORDER — MIDAZOLAM HCL 2 MG/2ML IJ SOLN
INTRAMUSCULAR | Status: AC
  Filled 2021-11-23: qty 2

## 2021-11-23 MED ORDER — ONDANSETRON HCL 4 MG/2ML IJ SOLN: 4.0000 mg | Freq: Four times a day (QID) | INTRAMUSCULAR | Status: DC | PRN

## 2021-11-23 MED ORDER — LABETALOL HCL 5 MG/ML IV SOLN: 10.0000 mg | INTRAVENOUS | Status: AC | PRN

## 2021-11-23 MED ORDER — FENTANYL CITRATE PF 50 MCG/ML IJ SOSY
25.0000 ug | PREFILLED_SYRINGE | Freq: Once | INTRAMUSCULAR | Status: AC
  Administered 2021-11-23: 25 ug via INTRAVENOUS
  Filled 2021-11-23: qty 1

## 2021-11-23 MED ORDER — FENTANYL CITRATE (PF) 100 MCG/2ML IJ SOLN
INTRAMUSCULAR | Status: AC
  Filled 2021-11-23: qty 2

## 2021-11-23 MED ORDER — NITROGLYCERIN 0.4 MG SL SUBL: 0.4000 mg | SUBLINGUAL_TABLET | SUBLINGUAL | Status: DC | PRN

## 2021-11-23 MED ORDER — INSULIN ASPART 100 UNIT/ML IJ SOLN
0.0000 [IU] | INTRAMUSCULAR | Status: DC
  Administered 2021-11-23: 1 [IU] via SUBCUTANEOUS

## 2021-11-23 MED ORDER — METOPROLOL TARTRATE 5 MG/5ML IV SOLN
5.0000 mg | Freq: Once | INTRAVENOUS | Status: AC
  Administered 2021-11-23: 5 mg via INTRAVENOUS
  Filled 2021-11-23: qty 5

## 2021-11-23 MED ORDER — HEPARIN (PORCINE) 25000 UT/250ML-% IV SOLN
1000.0000 [IU]/h | INTRAVENOUS | Status: DC
Start: 2021-11-24 — End: 2021-11-24
  Administered 2021-11-24: 1000 [IU]/h via INTRAVENOUS

## 2021-11-23 MED ORDER — PANTOPRAZOLE SODIUM 40 MG PO TBEC
40.0000 mg | DELAYED_RELEASE_TABLET | Freq: Every day | ORAL | Status: DC
  Administered 2021-11-24: 40 mg via ORAL
  Filled 2021-11-23 (×2): qty 1

## 2021-11-23 SURGICAL SUPPLY — 10 items
BAND CMPR LRG ZPHR (HEMOSTASIS) ×1
BAND ZEPHYR COMPRESS 30 LONG (HEMOSTASIS) ×1 IMPLANT
CATH 5FR JL3.5 JR4 ANG PIG MP (CATHETERS) ×1 IMPLANT
GLIDESHEATH SLEND SS 6F .021 (SHEATH) ×1 IMPLANT
GUIDEWIRE INQWIRE 1.5J.035X260 (WIRE) IMPLANT
INQWIRE 1.5J .035X260CM (WIRE) ×2
KIT HEART LEFT (KITS) ×2 IMPLANT
PACK CARDIAC CATHETERIZATION (CUSTOM PROCEDURE TRAY) ×2 IMPLANT
TRANSDUCER W/STOPCOCK (MISCELLANEOUS) ×2 IMPLANT
TUBING CIL FLEX 10 FLL-RA (TUBING) ×2 IMPLANT

## 2021-11-23 NOTE — ED Notes (Signed)
Pt ambulated to the toilet with can and returned unassisted, placed back on monitor. Cardiologist and son in room.

## 2021-11-23 NOTE — Progress Notes (Signed)
  Echocardiogram 2D Echocardiogram has been performed.  Joette Catching 11/23/2021, 10:42 AM

## 2021-11-23 NOTE — Progress Notes (Addendum)
Brief same-day note  Patient is 81 year old female with history of asthma, right breast cancer s/p lumpectomy on letrozole, paroxysmal A-fib on Eliquis, history of CVA, CKD stage II who presented from home with chest pain.  She drove herself here.  She was complaining of burning sensation on the upper chest on presentation she was hemodynamically stable.  EKG showed A-fib with RVR.  Lab work showed leukocytosis of 14.7.  Troponin showed gradual increment with positive delta.  Patient was admitted for further management, cardiology consulted. Patient seen and examined at the bedside this morning. Hemodynamically stable.  She remained comfortable.  Heart rate well controlled, rhythm was A-fib.  She denies any chest pain today.  I discussed with her about the need of staying in the hospital for further evaluation regarding possibility of NSTEMI. We will continue current management. I called and discussed with her son on phone about her plan

## 2021-11-23 NOTE — ED Provider Notes (Signed)
[facility] EMERGENCY DEPARTMENT Provider Note   CSN: [id number] Arrival date & time: 11/22/21  2350     History  Chief Complaint  Patient presents with   Chest Pain    Laura Mendez is a 81 y.o. female.  The history is provided by the patient and medical records.  Chest Pain  81 year old female with history of paroxysmal A-fib on Eliquis, CHF, asthma, seasonal allergies, prior stroke, presenting to the ED with chest pain.  Reports this began about 1 hour prior to arrival while she was on the phone with a friend.  States she got off the phone, took 2 Tums and a dose of her pantoprazole but did not have any relief.  Pain central in origin with some shortness of breath.  She has not had any nausea, vomiting, diaphoresis, or syncope.  Patient arrives in A-fib with RVR.  She does have a history of same and converted with metoprolol in the past.  She has been compliant with her Eliquis.  Home Medications Prior to Admission medications   Medication Sig Start Date End Date Taking? Authorizing Provider  acetaminophen (TYLENOL) 325 MG tablet Take 2 tablets (650 mg total) by mouth every 4 (four) hours as needed for mild pain (or temp > 37.5 C (99.5 F)). 07/14/21   Angiulli, Lavon Paganini, PA-C  albuterol (VENTOLIN HFA) 108 (90 Base) MCG/ACT inhaler INHALE 2 PUFFS BY MOUTH EVERY 4 HOURS AS NEEDED 07/31/21   Deneise Lever, MD  apixaban (ELIQUIS) 5 MG TABS tablet Take 1 tablet (5 mg total) by mouth 2 (two) times daily. 11/20/21   Hoyt Koch, MD  cholecalciferol (VITAMIN D) 1000 units tablet Take 1,000 Units by mouth daily.    [provider]  Fluticasone-Salmeterol (WIXELA INHUB) 250-50 MCG/DOSE AEPB Inhale 1 puff into the lungs 2 (two) times daily. 07/24/20   Baird Lyons D, MD  letrozole [facility]) 2.5 MG tablet Take 1 tablet (2.5 mg total) by mouth daily. 10/05/21   Nicholas Lose, MD  Multiple Vitamin (MULTIVITAMIN) tablet Take 1 tablet by mouth daily.     [provider]  pantoprazole (PROTONIX) 40 MG tablet Take 1 tablet (40 mg total) by mouth daily. 08/17/21   Hoyt Koch, MD  predniSONE (STERAPRED UNI-PAK 21 TAB) 10 MG (21) TBPK tablet Take by mouth daily. Patient not taking: Reported on 11/16/2021 10/21/21   Harland Dingwall L, NP-C      Allergies    Clarithromycin, Ibuprofen, Methotrexate, Codeine, Codeine phosphate, Diltiazem, Tape, Tapentadol, and Verapamil hcl er    Review of Systems   Review of Systems  Cardiovascular:  Positive for chest pain.  All other systems reviewed and are negative.   Physical Exam Updated Vital Signs BP 116/77   Pulse 74   Temp 98.1 F (36.7 C)   Resp 20   SpO2 93%  Physical Exam Vitals and nursing note reviewed.  Constitutional:      Appearance: She is well-developed.  HENT:     Head: Normocephalic and atraumatic.  Eyes:     Conjunctiva/sclera: Conjunctivae normal.     Pupils: Pupils are equal, round, and reactive to light.  Cardiovascular:     Rate and Rhythm: Tachycardia present. Rhythm irregularly irregular.     Heart sounds: Normal heart sounds.     Comments: AFIB RVR 150's Pulmonary:     Effort: Pulmonary effort is normal.     Breath sounds: Normal breath sounds.  Abdominal:     General: Bowel sounds  are normal.     Palpations: Abdomen is soft.  Musculoskeletal:        General: Normal range of motion.     Cervical back: Normal range of motion.  Skin:    General: Skin is warm and dry.  Neurological:     Mental Status: She is alert and oriented to person, place, and time.     ED Results / Procedures / Treatments   Labs (all labs ordered are listed, but only abnormal results are displayed) Labs Reviewed  BASIC METABOLIC PANEL - Abnormal; Notable for the following components:      Result Value   CO2 20 (*)    Glucose, Bld 192 (*)    BUN 25 (*)    Creatinine, Ser 1.19 (*)    GFR, Estimated 46 (*)    All other components within normal limits  CBC -  Abnormal; Notable for the following components:   WBC 14.7 (*)    Hemoglobin 15.4 (*)    Platelets 401 (*)    All other components within normal limits  TROPONIN I (HIGH SENSITIVITY) - Abnormal; Notable for the following components:   Troponin I (High Sensitivity) 50 (*)    All other components within normal limits  TROPONIN I (HIGH SENSITIVITY) - Abnormal; Notable for the following components:   Troponin I (High Sensitivity) 1,138 (*)    All other components within normal limits    EKG EKG Interpretation  Date/Time:  Monday November 23 2021 00:05:37 EDT Ventricular Rate:  156 PR Interval:    QRS Duration: 88 QT Interval:  296 QTC Calculation: 477 R Axis:   31 Text Interpretation: Atrial fibrillation with rapid ventricular response Septal infarct , age undetermined Abnormal ECG When compared with ECG of 29-Jun-2021 00:10, PREVIOUS ECG IS PRESENT Confirmed by Gerlene Fee [phone]) on 11/23/2021 12:36:52 AM  Radiology No results found.  Procedures Procedures   CRITICAL CARE Performed by: Larene Pickett   Total critical care time: 45 minutes  Critical care time was exclusive of separately billable procedures and treating other patients.  Critical care was necessary to treat or prevent imminent or life-threatening deterioration.  Critical care was time spent personally by me on the following activities: development of treatment plan with patient and/or surrogate as well as nursing, discussions with consultants, evaluation of patient's response to treatment, examination of patient, obtaining history from patient or surrogate, ordering and performing treatments and interventions, ordering and review of laboratory studies, ordering and review of radiographic studies, pulse oximetry and re-evaluation of patient's condition.   This patients CHA2DS2-VASc Score and unadjusted Ischemic Stroke Rate (% per year) is equal to 7.2 % stroke rate/year from a score of 5  Above score calculated as  1 point each if present [CHF, HTN, DM, Vascular=MI/PAD/Aortic Plaque, Age if 65-74, or Female] Above score calculated as 2 points each if present [Age > 75, or Stroke/TIA/TE]       Signed,  Larene Pickett, PA-C    11/23/2021 12:41 AM     Medications Ordered in ED Medications  aspirin tablet 325 mg (has no administration in time range)  letrozole [facility]) tablet 2.5 mg (has no administration in time range)  pantoprazole (PROTONIX) EC tablet 40 mg (has no administration in time range)  mometasone-formoterol (DULERA) 200-5 MCG/ACT inhaler 2 puff (has no administration in time range)  nitroGLYCERIN (NITROSTAT) SL tablet 0.4 mg (has no administration in time range)  acetaminophen (TYLENOL) tablet 650 mg (has no administration in time range)  ondansetron (ZOFRAN) injection 4 mg (has no administration in time range)  atorvastatin (LIPITOR) tablet 40 mg (has no administration in time range)  oxyCODONE (Oxy IR/ROXICODONE) immediate release tablet 5 mg (has no administration in time range)  insulin aspart (novoLOG) injection 0-6 Units (has no administration in time range)  0.9 %  sodium chloride infusion (has no administration in time range)  albuterol (PROVENTIL) (2.5 MG/3ML) 0.083% nebulizer solution 2.5 mg (has no administration in time range)  metoprolol tartrate (LOPRESSOR) injection 5 mg (5 mg Intravenous Given 11/23/21 0044)  fentaNYL (SUBLIMAZE) injection 25 mcg (25 mcg Intravenous Given 11/23/21 0243)    ED Course/ Medical Decision Making/ A&P                           Medical Decision Making Amount and/or Complexity of Data Reviewed Labs: ordered. Radiology: ordered.  Risk OTC drugs. Prescription drug management. Decision regarding hospitalization.   81 y.o. F presenting to the ED with chest pain that began 1 hour PTA-- central chest, some mild SOB associated.  No fever/chills.  She is in AFIB w/RVR.  Hx of same in the past and was able to convert with metoprolol in the ED.  She  has been compliant with her eliquis.  Labs pending along with CXR.  Will give dose of '5mg'$  IV metoprolol.  Initial troponin 50.  Mild leukocytosis.  No significant electrolyte derangement.  Creatinine appears around her baseline.  Chest x-ray with questionable atelectasis versus infiltrate.  She has not had any fevers or cough.  No sick contacts.  Feel pneumonia is less likely.  After IV metoprolol, heart rate is controlled in the 70s.  Will obtain delta troponin.  3:44 AM Delta trop 1138.  Patient pain free at this time, states she is feeling well.  Remains in AFIB but rate controlled.  Will discuss with cardiology.  4:40 AM Spoke with Dr. Humphrey Rolls with cardiology-- will observe overnight, plan for echo in AM, trend trops.  ASA now.  They will consult.  Admit to hospitalist.  Discussed with Dr. Myna Hidalgo-- will admit for ongoing care.  Final Clinical Impression(s) / ED Diagnoses Final diagnoses:  Paroxysmal atrial fibrillation ([place])  Elevated troponin    Rx / DC Orders ED Discharge Orders     None         Larene Pickett, PA-C 11/23/21 0541    Maudie Flakes, MD 11/23/21 [phone]

## 2021-11-23 NOTE — ED Notes (Signed)
Report given to Izell [place] PA trop. [place]

## 2021-11-23 NOTE — Progress Notes (Signed)
ANTICOAGULATION CONSULT NOTE - Initial Consult  Pharmacy Consult for Heparin (Apixaban on hold) Indication: chest pain/ACS, history of stroke and afib/flutter  Allergies  Allergen Reactions   Clarithromycin Anaphylaxis    "just about killed me"   Ibuprofen Hives    Other reaction(s): Unknown   Methotrexate     Other reaction(s): tongue swelling and mouth soreness   Codeine Nausea Only    Other reaction(s): Unknown   Codeine Phosphate Nausea Only   Diltiazem Rash   Tape Itching and Other (See Comments)    Tears skin   Tapentadol Itching    Other reaction(s): Other (See Comments) Tears skin   Verapamil Hcl Er Rash    Patient Measurements: Height: '5\' 10"'$  (177.8 cm) Weight: 88.5 kg (195 lb) IBW/kg (Calculated) : 68.5  Vital Signs: Temp: 98.2 F (36.8 C) (07/03 0359) Temp Source: Oral (07/03 0359) BP: 116/72 (07/03 0430) Pulse Rate: 72 (07/03 0430)  Labs: Recent Labs    11/23/21 0016 11/23/21 0245  HGB 15.4*  --   HCT 45.6  --   PLT 401*  --   CREATININE 1.19*  --   TROPONINIHS 50* 1,138*    Estimated Creatinine Clearance: 44.8 mL/min (A) (by C-G formula based on SCr of 1.19 mg/dL (H)).   Medical History: Past Medical History:  Diagnosis Date   Asthma    Chronic airway obstruction, not elsewhere classified    Diastolic dysfunction without heart failure    Disorder of bone and cartilage, unspecified    Dysrhythmia    A-Fib   Family history of colon cancer    Family history of kidney cancer    Family history of ovarian cancer    Paroxysmal atrial flutter (HCC)    Stroke ([place])    TIA   TIA (transient ischemic attack)    Unspecified asthma(493.90)     Assessment: 82 y/o F with chest pain and elevated troponin to start heparin. Pt is on apixaban PTA for history of stroke and afib/flutter. Last apixaban dose was on the evening of 7/2. Anticipate using aPTT to dose for now.   Goal of Therapy:  Heparin level 0.3-0.7 units/ml aPTT 66-102 seconds Monitor  platelets by anticoagulation protocol: Yes   Plan:  Start heparin drip at 1000 units/hr Check heparin level and aPTT at 1600 Daily CBC, heparin level, and aPTT Monitor for bleeding  Narda Bonds, PharmD, BCPS Clinical Pharmacist Phone: [phone]

## 2021-11-23 NOTE — ED Notes (Signed)
Cardiologist and Pt son in room with patient discussing going to [place] [facility] today with patient. Last took [facility] yesterday before coming into the ER.

## 2021-11-23 NOTE — ED Triage Notes (Signed)
Pt reports that she began to have epigastric CP that started about an hour ago with some SOB, denies n/v, pt took two tums PTA without relief

## 2021-11-23 NOTE — ED Notes (Signed)
Pt off the floor [facility] arrived and gathered pt along with her belongings after she returned from the toilet.

## 2021-11-23 NOTE — Progress Notes (Signed)
[place] for Heparin (Apixaban on hold) Indication: chest pain/ACS, history of stroke and afib/flutter  Allergies  Allergen Reactions   Clarithromycin Anaphylaxis    "just about killed me"   Ibuprofen Hives   Aspirin Hives   Methotrexate Other (See Comments)     tongue swelling and mouth soreness   Codeine Nausea And Vomiting   Codeine Phosphate Nausea And Vomiting   Diltiazem Rash   Tape Itching and Other (See Comments)    Tears skin   Tapentadol Itching    Other reaction(s): Other (See Comments) Tears skin   Verapamil Hcl Er Rash    Patient Measurements: Height: '5\' 10"'$  (177.8 cm) Weight: 88.5 kg (195 lb) IBW/kg (Calculated) : 68.5  Vital Signs: Temp: 97.6 F (36.4 C) (07/03 1900) Temp Source: Oral (07/03 1900) BP: 124/83 (07/03 1900) Pulse Rate: 41 (07/03 1900)  Labs: Recent Labs    11/23/21 0016 11/23/21 0245 11/23/21 0600 11/23/21 0911 11/23/21 1836  HGB 15.4*  --   --   --   --   HCT 45.6  --   --   --   --   PLT 401*  --   --   --   --   APTT  --   --   --   --  186*  HEPARINUNFRC  --   --   --   --  >1.10*  CREATININE 1.19*  --   --   --   --   TROPONINIHS 50* 1,138* 4,746* 6,542*  --      Estimated Creatinine Clearance: 44.8 mL/min (A) (by C-G formula based on SCr of 1.19 mg/dL (H)).   Assessment: 81 y/o F with chest pain and elevated troponin to start heparin. Pt is on apixaban PTA for history of stroke and afib/flutter. Last apixaban dose was on the evening of 7/2. Using aPTT for monitoring until aPTT and heparin level are correlating.  S/p cath 7/3 - patent coronary arteries. Plan to resume heparin 2 hours post TR band removal (RN said it should be off at 2230).  Goal of Therapy:  Heparin level 0.3-0.7 units/ml aPTT 66-102 seconds Monitor platelets by anticoagulation protocol: Yes   Plan:  At [place], start heparin drip at 1000 units/hr Will f/u 8 hr aPTT Noted plan to transition to apixaban tomorrrow  if no issues  Sherlon Handing, PharmD, BCPS Please see amion for complete clinical pharmacist phone list 11/23/2021 10:15 PM

## 2021-11-23 NOTE — Interval H&P Note (Signed)
Cath Lab Visit (complete for each Cath Lab visit)  Clinical Evaluation Leading to the Procedure:   ACS: Yes.    Non-ACS:    Anginal Classification: CCS IV  Anti-ischemic medical therapy: Minimal Therapy (1 class of medications)  Non-Invasive Test Results: No non-invasive testing performed  Prior CABG: No previous CABG      History and Physical Interval Note:  11/23/2021 5:29 PM  Laura Mendez  has presented today for surgery, with the diagnosis of nstemi.  The various methods of treatment have been discussed with the patient and family. After consideration of risks, benefits and other options for treatment, the patient has consented to  Procedure(s): LEFT HEART CATH AND CORONARY ANGIOGRAPHY (N/A) as a surgical intervention.  The patient's history has been reviewed, patient examined, no change in status, stable for surgery.  I have reviewed the patient's chart and labs.  Questions were answered to the patient's satisfaction.     Sherren Mocha

## 2021-11-23 NOTE — ED Notes (Addendum)
Pt IV infiltrated while heparin and fluids going. IV fluids stopped immediately. Consulted pharmacy with recommendation of warm compress. IV removed and warm compress applied.

## 2021-11-23 NOTE — H&P (Addendum)
History and Physical    Lakea Mendez JME:[id number] DOB: 1941-01-19 DOA: 11/22/2021  PCP: Hoyt Koch, MD   Patient coming from: Home   Chief Complaint: Chest pain  HPI: Laura Mendez is a pleasant 81 y.o. 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  The patient reports that she was in her usual state of health last night, he got ready for bed, and was watching TV when she developed cute onset of a burning discomfort in the central chest.  She rubbed her chest and took 2 Tums, but could not find any relief and eventually came into the ED after about an hour.  She continued to have this burning discomfort in the emergency department until she was treated with fentanyl.  He denies any known history of coronary artery disease.  She had some dyspnea associated with this but no cough and had not been short of breath or coughing earlier in the day.  Denies any recent fever or chills.  No leg swelling or tenderness. She had been laundry earlier in the day without SOB or chest discomfort.   ED Course: Upon arrival to the ED, patient is found to be afebrile and saturating mid 90s on room air with stable blood pressure.  EKG features atrial fibrillation with RVR.  Chest x-ray with patchy opacity in the bases.  Chemistry panel notable for glucose under 92 and creatinine 1.19.  CBC with leukocytosis to 14,700 and a slight thrombocytosis.  Initial troponin was 50, then increased to 1138.  Patient was given 5 mg IV Lopressor and IV fentanyl in the emergency department, 325 mg of aspirin was ordered, and cardiology was consulted by the ED PA.  Review of Systems:  All other systems reviewed and apart from HPI, are negative.  Past Medical History:  Diagnosis Date    Asthma    Chronic airway obstruction, not elsewhere classified    Diastolic dysfunction without heart failure    Disorder of bone and cartilage, unspecified    Dysrhythmia    A-Fib   Family history of colon cancer    Family history of kidney cancer    Family history of ovarian cancer    Paroxysmal atrial flutter (HCC)    Stroke ([place])    TIA   TIA (transient ischemic attack)    Unspecified asthma(493.90)     Past Surgical History:  Procedure Laterality Date   ABDOMINAL HYSTERECTOMY     BREAST LUMPECTOMY Right 12/30/2017   BREAST LUMPECTOMY WITH RADIOACTIVE SEED LOCALIZATION Right 12/30/2017   Procedure: BREAST LUMPECTOMY WITH RADIOACTIVE SEED LOCALIZATION X'S 3;  Surgeon: Rolm Bookbinder, MD;  Location: [facility];  Service: General;  Laterality: Right;   BUNIONECTOMY Bilateral    EYE SURGERY Bilateral    cataract removal    Social History:   reports that she quit smoking about 44 years ago. Her smoking use included cigarettes. She has never used smokeless tobacco. She reports that she does not drink alcohol and does not use drugs.  Allergies  Allergen Reactions   Clarithromycin Anaphylaxis    "just about killed me"   Ibuprofen Hives    Other reaction(s): Unknown   Methotrexate     Other reaction(s): tongue swelling and mouth soreness   Codeine Nausea Only    Other reaction(s): Unknown  Codeine Phosphate Nausea Only   Diltiazem Rash   Tape Itching and Other (See Comments)    Tears skin   Tapentadol Itching    Other reaction(s): Other (See Comments) Tears skin   Verapamil [facility] Rash    Family History  Problem Relation Age of Onset   COPD Father    Colon cancer Mother 17       mets to pancreas and liver   Kidney cancer Sister 73       d. 18   Lung cancer Sister        d. 75, smoker   Ovarian cancer Sister 52       d. 64   Cirrhosis Sister        d. 64   Melanoma Sister 76   Ovarian cancer Maternal Grandmother        d. 58   Heart disease Maternal  Grandfather        rheumatic heart disease   Rectal cancer Paternal Grandfather        d. 30   Parkinson's disease Brother    Ovarian cancer Maternal Aunt    Colon cancer Maternal Uncle        d. 72-73   Heart attack Paternal Uncle        d. 1   Kidney cancer Brother 45   Colon cancer Maternal Uncle        d. 68   Cancer Other        MGMs pat 1/2 sister with uterine and rectal cancer   Colon cancer Cousin        d. 64 - mat first cousin   Breast cancer Neg Hx      Prior to Admission medications   Medication Sig Start Date End Date Taking? Authorizing Provider  acetaminophen (TYLENOL) 325 MG tablet Take 2 tablets (650 mg total) by mouth every 4 (four) hours as needed for mild pain (or temp > 37.5 C (99.5 F)). 07/14/21   Angiulli, Lavon Paganini, PA-C  albuterol (VENTOLIN HFA) 108 (90 Base) MCG/ACT inhaler INHALE 2 PUFFS BY MOUTH EVERY 4 HOURS AS NEEDED 07/31/21   Deneise Lever, MD  apixaban (ELIQUIS) 5 MG TABS tablet Take 1 tablet (5 mg total) by mouth 2 (two) times daily. 11/20/21   Hoyt Koch, MD  cholecalciferol (VITAMIN D) 1000 units tablet Take 1,000 Units by mouth daily.    [provider]  Fluticasone-Salmeterol (WIXELA INHUB) 250-50 MCG/DOSE AEPB Inhale 1 puff into the lungs 2 (two) times daily. 07/24/20   Baird Lyons D, MD  letrozole [facility]) 2.5 MG tablet Take 1 tablet (2.5 mg total) by mouth daily. 10/05/21   Nicholas Lose, MD  Multiple Vitamin (MULTIVITAMIN) tablet Take 1 tablet by mouth daily.    [provider]  pantoprazole (PROTONIX) 40 MG tablet Take 1 tablet (40 mg total) by mouth daily. 08/17/21   Hoyt Koch, MD  predniSONE (STERAPRED UNI-PAK 21 TAB) 10 MG (21) TBPK tablet Take by mouth daily. Patient not taking: Reported on 11/16/2021 10/21/21   Girtha Rm, NP-C    Physical Exam: Vitals:   11/23/21 0345 11/23/21 0359 11/23/21 0400 11/23/21 0430  BP: 116/63 (!) 123/93 115/69 116/72  Pulse: (!) 104 66 76 72  Resp: '19 16 16  15  '$ Temp:  98.2 F (36.8 C)    TempSrc:  Oral    SpO2: 95% 96% 94% 94%  Weight:      Height:  Constitutional: NAD, calm  Eyes: PERTLA, lids and conjunctivae normal ENMT: Mucous membranes are moist. Posterior pharynx clear of any exudate or lesions.   Neck: supple, no masses  Respiratory: no wheezing, no crackles. No accessory muscle use.  Cardiovascular: S1 & S2 heard, regular rate and rhythm. No extremity edema.  Abdomen: No distension, no tenderness, soft. Bowel sounds active.  Musculoskeletal: no clubbing / cyanosis. No joint deformity upper and lower extremities.   Skin: no significant rashes, lesions, ulcers. Warm, dry, well-perfused. Neurologic: CN 2-12 grossly intact. Moving all extremities. Alert and oriented.  Psychiatric: Pleasant. Cooperative.    Labs and Imaging on Admission: I have personally reviewed following labs and imaging studies  CBC: Recent Labs  Lab 11/23/21 0016  WBC 14.7*  HGB 15.4*  HCT 45.6  MCV 92.1  PLT 841*   Basic Metabolic Panel: Recent Labs  Lab 11/23/21 0016  NA 137  K 4.1  CL 102  CO2 20*  GLUCOSE 192*  BUN 25*  CREATININE 1.19*  CALCIUM 9.4   GFR: Estimated Creatinine Clearance: 44.8 mL/min (A) (by C-G formula based on SCr of 1.19 mg/dL (H)). Liver Function Tests: No results for input(s): "AST", "ALT", "ALKPHOS", "BILITOT", "PROT", "ALBUMIN" in the last 168 hours. No results for input(s): "LIPASE", "AMYLASE" in the last 168 hours. No results for input(s): "AMMONIA" in the last 168 hours. Coagulation Profile: No results for input(s): "INR", "PROTIME" in the last 168 hours. Cardiac Enzymes: No results for input(s): "CKTOTAL", "CKMB", "CKMBINDEX", "TROPONINI" in the last 168 hours. BNP (last 3 results) No results for input(s): "PROBNP" in the last 8760 hours. HbA1C: No results for input(s): "HGBA1C" in the last 72 hours. CBG: No results for input(s): "GLUCAP" in the last 168 hours. Lipid Profile: No results for  input(s): "CHOL", "HDL", "LDLCALC", "TRIG", "CHOLHDL", "LDLDIRECT" in the last 72 hours. Thyroid Function Tests: No results for input(s): "TSH", "T4TOTAL", "FREET4", "T3FREE", "THYROIDAB" in the last 72 hours. Anemia Panel: No results for input(s): "VITAMINB12", "FOLATE", "FERRITIN", "TIBC", "IRON", "RETICCTPCT" in the last 72 hours. Urine analysis:    Component Value Date/Time   COLORURINE YELLOW 06/28/2021 [place] 06/28/2021 1533   LABSPEC 1.010 06/28/2021 1533   PHURINE 7.5 06/28/2021 1533   GLUCOSEU NEGATIVE 06/28/2021 1533   GLUCOSEU NEGATIVE 07/28/2020 1042   HGBUR TRACE (A) 06/28/2021 1533   HGBUR negative 09/18/2009 0846   BILIRUBINUR NEGATIVE 06/28/2021 1533   KETONESUR NEGATIVE 06/28/2021 1533   PROTEINUR NEGATIVE 06/28/2021 1533   UROBILINOGEN 0.2 07/28/2020 1042   NITRITE NEGATIVE 06/28/2021 1533   LEUKOCYTESUR NEGATIVE 06/28/2021 1533   Sepsis Labs: '@LABRCNTIP'$ (procalcitonin:4,lacticidven:4) )No results found for this or any previous visit (from the past 240 hour(s)).   Radiological Exams on Admission: DG Chest Portable 1 View  Result Date: 11/23/2021 CLINICAL DATA:  Chest pain and shortness of breath. EXAM: PORTABLE CHEST 1 VIEW COMPARISON:  Chest radiograph 09/01/2021 FINDINGS: Stable heart size and mediastinal contours. Mild chronic hyperinflation and bronchial thickening. There are ill-defined patchy bibasilar opacities not seen on prior exam. No pulmonary edema, pleural effusion, or pneumothorax. Grossly stable osseous structures. IMPRESSION: Ill-defined patchy bibasilar opacities, not seen on prior exam. This may represent atelectasis, pneumonia, or combination thereof. Electronically Signed   By: Keith Rake M.D.   On: 11/23/2021 01:02    EKG: Independently reviewed. Atrial fibrillation with RVR.   Assessment/Plan   1. Chest pain, elevated troponin  - Presents with chest pain, relieved with fentanyl in ED, and has troponin of 50 then  1138  two and a half hrs later concerning for NSTEMI, possibly type II in setting of rapid a fib  - Cardiology consulted by ED PA  - She recieved beta-blocker in ED and ASA was ordered  - Start IV heparin, administer Lipitor 80 mg, continue cardiac monitoring, trend troponin, check echocardiogram per cardiology recommendations    2. PAF with RVR  - In a fib RVR in ED with rate as high as 150s, down to 70s after 5 mg IV Lopressor   - Not on rate-control agent at home  - Continue anticoagulation for CVA prevention    3. Asthma  - Stable, no cough or wheeze on admission  - Continue ICS/LABA and as-needed albuterol    4. 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 AKI on CKD II  - SCr is 1.19 on admission, up from baseline closer to 0.8  - Check UA and urine chemistries, start gentle IVF hydration, repeat serum chemistries in am   6. Hyperglycemia  - Serum glucose 192 in ED without hx of DM  - Update A1c, monitor CBG, and use low-intensity SSI if needed     DVT prophylaxis: IV heparin  Code Status: Full  Level of Care: Level of care: Telemetry Cardiac Family Communication: None present  Disposition Plan:  Patient is from: home Anticipated d/c is to: Home  Anticipated d/c date is: 7/5 or 11/26/21  Patient currently: Pending echo, cardiology consultation  Consults called: Cardiology  Admission status: Inpatient    Vianne Bulls, MD [facility]  11/23/2021, 5:38 AM

## 2021-11-23 NOTE — Consult Note (Addendum)
Cardiology Consultation:   Patient ID: Laura Mendez MRN: [id number]; DOB: 1941-04-18  Admit date: 11/22/2021 Date of Consult: 11/23/2021  PCP:  Hoyt Koch, MD   [facility] Providers Cardiologist:  Candee Furbish, MD      Patient Profile:   Laura Mendez is a 81 y.o. female with a hx of P Atrial flutter since 2017 on eliquis, history of TIA, no prior hx of CAD who is being seen 11/23/2021 for the evaluation of chest pain, NSTEMI at the request of Dr. Tawanna Solo.  History of Present Illness:   Ms. Dowson is an 81 year old female with above medical history who has been followed by Dr. Marlou Porch. Patient was first diagnosed with atrial flutter in 12/2015, auto converted after receiving 5 mg of metoprolol. Echocardiogram in 12/2015 showed EF 55-60%, no regional wall motion abnormalities, grade I diastolic dysfunction, severely calcified mitral valve,severely dilated LA. She was started on cardizem, but this was stopped due to rash. Does not appear to be on any rate controlling medications. She has been on eliquis for anticoagulation.   Patient was recently admitted from 2/5-2/22 for treatment of a CVA. Imaging showed multiple vascular territories bilaterally. Strong suspicion for embolic phenomenon despite adherence to xarelto. Echocardiogram on 06/29/21 showed EF 24-09%, grade I diastolic dysfunction, moderately dilated AL, moderate mitral calcification.   Patient presented to the ED around midnight on 7/3 complaining of epigastric CP that began about an hour prior to presentation. Labs in the ED showed Ha 137, K 4.1, creatinine 1.19, WBC 14.7, hemoglobin 15.4, platelets 401. hsTn 50>>1138>>4746>>6542   CXR showed ill-defined patchy bibasilar opacities. EKG showed atrial fibrillation, HR 156 BPM. Patient was given a 5 mg IV dose of lopressor. Follow up ekg showed sinus rhythm with frequent PVCs, HR in the 90s.   Patient took her eliquis immediately before coming to the ED,  late in the evening on 7/2.   On interview, patient reports that she was getting ready for bed last night when she felt a sudden onset of chest pain. Felt like a burning feeling, not relieved with antiacids. Did not radiate. Denies any sob, palpitations, nausea, dizziness. Denies ever having similar chest pain prior. Does have a history of afib, denies any history of CAD or heart failure.  She was able to drive herself to the ED. Has not smoked any cigarettes since the 1970s, denies history of HLD, HTN, diabetes. Does not have a family history of heart disease.    Past Medical History:  Diagnosis Date   Asthma    Chronic airway obstruction, not elsewhere classified    Diastolic dysfunction without heart failure    Disorder of bone and cartilage, unspecified    Dysrhythmia    A-Fib   Family history of colon cancer    Family history of kidney cancer    Family history of ovarian cancer    Paroxysmal atrial flutter (HCC)    Stroke ([place])    TIA   TIA (transient ischemic attack)    Unspecified asthma(493.90)     Past Surgical History:  Procedure Laterality Date   ABDOMINAL HYSTERECTOMY     BREAST LUMPECTOMY Right 12/30/2017   BREAST LUMPECTOMY WITH RADIOACTIVE SEED LOCALIZATION Right 12/30/2017   Procedure: BREAST LUMPECTOMY WITH RADIOACTIVE SEED LOCALIZATION X'S 3;  Surgeon: Rolm Bookbinder, MD;  Location: [facility];  Service: General;  Laterality: Right;   BUNIONECTOMY Bilateral    EYE SURGERY Bilateral    cataract removal     Home Medications:  Prior to Admission medications   Medication Sig Start Date End Date Taking? Authorizing Provider  acetaminophen (TYLENOL) 325 MG tablet Take 2 tablets (650 mg total) by mouth every 4 (four) hours as needed for mild pain (or temp > 37.5 C (99.5 F)). Patient taking differently: Take 650 mg by mouth daily as needed for headache. 07/14/21  Yes Angiulli, Lavon Paganini, PA-C  albuterol (VENTOLIN HFA) 108 (90 Base) MCG/ACT inhaler INHALE 2 PUFFS BY MOUTH  EVERY 4 HOURS AS NEEDED Patient taking differently: Inhale 2 puffs into the lungs as needed (asthma). 07/31/21  Yes Young, Tarri Fuller D, MD  apixaban (ELIQUIS) 5 MG TABS tablet Take 1 tablet (5 mg total) by mouth 2 (two) times daily. 11/20/21  Yes Hoyt Koch, MD  atorvastatin (LIPITOR) 20 MG tablet Take 1 tablet by mouth daily.   Yes [provider]  Calcium Carbonate Antacid (TUMS PO) Take 0.5 tablets by mouth at bedtime as needed (stomach upset).   Yes [provider]  DUPIXENT 300 MG/2ML SOPN Inject 300 mg into the skin every 14 (fourteen) days. 10/30/21  Yes [provider]  Fluticasone-Salmeterol (WIXELA INHUB) 250-50 MCG/DOSE AEPB Inhale 1 puff into the lungs 2 (two) times daily. Patient taking differently: Inhale 1 puff into the lungs daily. 07/24/20  Yes Young, Tarri Fuller D, MD  Ketotifen Fumarate (ZADITOR OP) Place 1 drop into both eyes at bedtime as needed (allergies).   Yes [provider]  letrozole (FEMARA) 2.5 MG tablet Take 1 tablet (2.5 mg total) by mouth daily. 10/05/21  Yes Nicholas Lose, MD  Multiple Vitamin (MULTIVITAMIN) tablet Take 1 tablet by mouth daily.   Yes [provider]  pantoprazole (PROTONIX) 40 MG tablet Take 1 tablet (40 mg total) by mouth daily. 08/17/21  Yes Hoyt Koch, MD  predniSONE (STERAPRED UNI-PAK 21 TAB) 10 MG (21) TBPK tablet Take by mouth daily. Patient taking differently: Take 20 mg by mouth in the morning, at noon, and at bedtime. 10/21/21  Yes Henson, Vickie L, NP-C  PRESCRIPTION MEDICATION Inhale 1 ampule into the lungs daily as needed (SOB). Nebulizer solution   Yes [provider]  VITAMIN D PO Take 1 tablet by mouth daily.   Yes [provider]    Inpatient Medications: Scheduled Meds:  aspirin  325 mg Oral Daily   atorvastatin  80 mg Oral Daily   insulin aspart  0-6 Units Subcutaneous Q4H   letrozole  2.5 mg Oral Daily   mepolizumab  100 mg Subcutaneous Q28 days    mometasone-formoterol  2 puff Inhalation BID   pantoprazole  40 mg Oral Daily   Continuous Infusions:  sodium chloride 75 mL/hr at 11/23/21 0621   heparin 1,000 Units/hr (11/23/21 0625)   PRN Meds: acetaminophen, albuterol, nitroGLYCERIN, ondansetron (ZOFRAN) IV, oxyCODONE  Allergies:    Allergies  Allergen Reactions   Clarithromycin Anaphylaxis    "just about killed me"   Ibuprofen Hives   Aspirin Hives   Methotrexate Other (See Comments)     tongue swelling and mouth soreness   Codeine Nausea And Vomiting   Codeine Phosphate Nausea And Vomiting   Diltiazem Rash   Tape Itching and Other (See Comments)    Tears skin   Tapentadol Itching    Other reaction(s): Other (See Comments) Tears skin   Verapamil Hcl Er Rash    Social History:   Social History   Socioeconomic History   Marital status: Divorced    Spouse name: Not on file   Number  of children: Not on file   Years of education: Not on file   Highest education level: Not on file  Occupational History   Occupation: retired  Tobacco Use   Smoking status: Former    Years: 0.00    Types: Cigarettes    Quit date: 05/24/1977    Years since quitting: 44.5   Smokeless tobacco: Never   Tobacco comments:    7/7 pt states she occasionally smoked, let cigs "burn" more than she smoked them  Vaping Use   Vaping Use: Never used  Substance and Sexual Activity   Alcohol use: No   Drug use: No   Sexual activity: Not on file  Other Topics Concern   Not on file  Social History Narrative   Right handed   1 coke per day   Lives alone   Social Determinants of Health   Financial Resource Strain: Low Risk  (11/16/2021)   Overall Financial Resource Strain (CARDIA)    Difficulty of Paying Living Expenses: Not hard at all  Food Insecurity: No Food Insecurity (11/16/2021)   Hunger Vital Sign    Worried About Running Out of Food in the Last Year: Never true    Ran Out of Food in the Last Year: Never true  Transportation Needs:  No Transportation Needs (11/16/2021)   PRAPARE - Hydrologist (Medical): No    Lack of Transportation (Non-Medical): No  Physical Activity: Insufficiently Active (11/16/2021)   Exercise Vital Sign    Days of Exercise per Week: 2 days    Minutes of Exercise per Session: 30 min  Stress: No Stress Concern Present (11/16/2021)   [place]    Feeling of Stress : Not at all  Social Connections: Moderately Isolated (11/16/2021)   Social Connection and Isolation Panel [NHANES]    Frequency of Communication with Friends and Family: Three times a week    Frequency of Social Gatherings with Friends and Family: Three times a week    Attends Religious Services: More than 4 times per year    Active Member of Clubs or Organizations: No    Attends Archivist Meetings: Never    Marital Status: Divorced  Human resources officer Violence: Not At Risk (11/16/2021)   Humiliation, Afraid, Rape, and Kick questionnaire    Fear of Current or Ex-Partner: No    Emotionally Abused: No    Physically Abused: No    Sexually Abused: No    Family History:    Family History  Problem Relation Age of Onset   COPD Father    Colon cancer Mother 66       mets to pancreas and liver   Kidney cancer Sister 48       d. 81   Lung cancer Sister        d. 75, smoker   Ovarian cancer Sister 35       d. 38   Cirrhosis Sister        d. 1   Melanoma Sister 56   Ovarian cancer Maternal Grandmother        d. 71   Heart disease Maternal Grandfather        rheumatic heart disease   Rectal cancer Paternal Grandfather        d. 1   Parkinson's disease Brother    Ovarian cancer Maternal Aunt    Colon cancer Maternal Uncle        d.  72-73   Heart attack Paternal Uncle        d. 45   Kidney cancer Brother 76   Colon cancer Maternal Uncle        d. 32   Cancer Other        MGMs pat 1/2 sister with uterine and rectal cancer    Colon cancer Cousin        d. 34 - mat first cousin   Breast cancer Neg Hx      ROS:  Please see the history of present illness.   All other ROS reviewed and negative.     Physical Exam/Data:   Vitals:   11/23/21 1115 11/23/21 1145 11/23/21 1200 11/23/21 1215  BP: 138/61 (!) 125/56 123/72 (!) 111/54  Pulse: (!) 43 (!) 43 66 73  Resp: 20 (!) '22 17 16  '$ Temp:      TempSrc:      SpO2: 95% 95% 93% 93%  Weight:      Height:       No intake or output data in the 24 hours ending 11/23/21 1317    11/23/2021    2:51 AM 11/19/2021   11:08 AM 10/21/2021   11:16 AM  Last 3 Weights  Weight (lbs) 195 lb 199 lb 14.4 oz 195 lb  Weight (kg) 88.451 kg 90.674 kg 88.451 kg     Body mass index is 27.98 kg/m.  General:  Well nourished, well developed, in no acute distress HEENT: normal Neck: no JVD Vascular: Radial pulses 2+ bilaterally Cardiac:  normal S1, S2; irregular rate and rhythm  Lungs:  clear to auscultation bilaterally, no wheezing, rhonchi or rales  Abd: soft, nontender, no hepatomegaly  Ext: no edema Musculoskeletal:  No deformities, BUE and BLE strength normal and equal Skin: warm and dry  Neuro:  CNs 2-12 intact, no focal abnormalities noted Psych:  Normal affect   EKG:  The EKG was personally reviewed and demonstrates:  sinus arrhythmia, LVH Telemetry:  Telemetry was personally reviewed and demonstrates:  Sinus rhythm with frequent PVCs   Relevant CV Studies:  Echocardiogram 11/23/21 1. Left ventricular ejection fraction, by estimation, is 60 to 65%. The  left ventricle has normal function. The left ventricle has no regional  wall motion abnormalities. There is mild left ventricular hypertrophy.  Left ventricular diastolic parameters  are consistent with Grade I diastolic dysfunction (impaired relaxation).  Elevated left atrial pressure.   2. Right ventricular systolic function is normal. The right ventricular  size is normal. There is mildly elevated pulmonary  artery systolic  pressure.   3. Left atrial size was severely dilated.   4. The mitral valve is normal in structure. Trivial mitral valve  regurgitation. No evidence of mitral stenosis. Severe mitral annular  calcification.   5. The aortic valve is tricuspid. Aortic valve regurgitation is not  visualized. Aortic valve sclerosis is present, with no evidence of aortic  valve stenosis.   6. The inferior vena cava is dilated in size with >50% respiratory  variability, suggesting right atrial pressure of 8 mmHg.   Laboratory Data:  High Sensitivity Troponin:   Recent Labs  Lab 11/23/21 0016 11/23/21 0245 11/23/21 0600 11/23/21 0911  TROPONINIHS 50* 1,138* 4,746* 6,542*     Chemistry Recent Labs  Lab 11/23/21 0016  NA 137  K 4.1  CL 102  CO2 20*  GLUCOSE 192*  BUN 25*  CREATININE 1.19*  CALCIUM 9.4  GFRNONAA 46*  ANIONGAP 15    No results  for input(s): "PROT", "ALBUMIN", "AST", "ALT", "ALKPHOS", "BILITOT" in the last 168 hours. Lipids No results for input(s): "CHOL", "TRIG", "HDL", "LABVLDL", "LDLCALC", "CHOLHDL" in the last 168 hours.  Hematology Recent Labs  Lab 11/23/21 0016  WBC 14.7*  RBC 4.95  HGB 15.4*  HCT 45.6  MCV 92.1  MCH 31.1  MCHC 33.8  RDW 14.7  PLT 401*   Thyroid No results for input(s): "TSH", "FREET4" in the last 168 hours.  BNPNo results for input(s): "BNP", "PROBNP" in the last 168 hours.  DDimer No results for input(s): "DDIMER" in the last 168 hours.   Radiology/Studies:  ECHOCARDIOGRAM COMPLETE  Result Date: 11/23/2021    ECHOCARDIOGRAM REPORT   Patient Name:   Laura Mendez Date of Exam: 11/23/2021 Medical Rec #:  [id number]              Height:       70.0 in Accession #:    [id number]             Weight:       195.0 lb Date of Birth:  May 28, 1940               BSA:          2.065 m Patient Age:    53 years               BP:           119/62 mmHg Patient Gender: F                      HR:           87 bpm. Exam Location:  Inpatient  Procedure: 2D Echo, Cardiac Doppler and Color Doppler Indications:    NSTEMI  History:        Patient has prior history of Echocardiogram examinations, most                 recent 06/29/2021. Arrythmias:Atrial Fibrillation;                 Signs/Symptoms:Chest Pain.  Sonographer:    Joette Catching RCS Referring Phys: [phone] [place]  1. Left ventricular ejection fraction, by estimation, is 60 to 65%. The left ventricle has normal function. The left ventricle has no regional wall motion abnormalities. There is mild left ventricular hypertrophy. Left ventricular diastolic parameters are consistent with Grade I diastolic dysfunction (impaired relaxation). Elevated left atrial pressure.  2. Right ventricular systolic function is normal. The right ventricular size is normal. There is mildly elevated pulmonary artery systolic pressure.  3. Left atrial size was severely dilated.  4. The mitral valve is normal in structure. Trivial mitral valve regurgitation. No evidence of mitral stenosis. Severe mitral annular calcification.  5. The aortic valve is tricuspid. Aortic valve regurgitation is not visualized. Aortic valve sclerosis is present, with no evidence of aortic valve stenosis.  6. The inferior vena cava is dilated in size with >50% respiratory variability, suggesting right atrial pressure of 8 mmHg. FINDINGS  Left Ventricle: Left ventricular ejection fraction, by estimation, is 60 to 65%. The left ventricle has normal function. The left ventricle has no regional wall motion abnormalities. The left ventricular internal cavity size was normal in size. There is  mild left ventricular hypertrophy. Left ventricular diastolic parameters are consistent with Grade I diastolic dysfunction (impaired relaxation). Elevated left atrial pressure. Right Ventricle: The right ventricular size is normal. Right ventricular systolic function is normal. There  is mildly elevated pulmonary artery systolic pressure. The  tricuspid regurgitant velocity is 3.03 m/s, and with an assumed right atrial pressure of 8 mmHg, the estimated right ventricular systolic pressure is 73.7 mmHg. Left Atrium: Left atrial size was severely dilated. Right Atrium: Right atrial size was normal in size. Pericardium: There is no evidence of pericardial effusion. Mitral Valve: The mitral valve is normal in structure. Severe mitral annular calcification. Trivial mitral valve regurgitation. No evidence of mitral valve stenosis. Tricuspid Valve: The tricuspid valve is normal in structure. Tricuspid valve regurgitation is mild . No evidence of tricuspid stenosis. Aortic Valve: The aortic valve is tricuspid. Aortic valve regurgitation is not visualized. Aortic valve sclerosis is present, with no evidence of aortic valve stenosis. Aortic valve mean gradient measures 5.0 mmHg. Aortic valve peak gradient measures 10.5 mmHg. Aortic valve area, by VTI measures 3.06 cm. Pulmonic Valve: The pulmonic valve was normal in structure. Pulmonic valve regurgitation is trivial. No evidence of pulmonic stenosis. Aorta: The aortic root is normal in size and structure. Venous: The inferior vena cava is dilated in size with greater than 50% respiratory variability, suggesting right atrial pressure of 8 mmHg. IAS/Shunts: No atrial level shunt detected by color flow Doppler.  LEFT VENTRICLE PLAX 2D LVIDd:         5.00 cm   Diastology LVIDs:         3.40 cm   LV e' medial:    5.98 cm/s LV PW:         1.20 cm   LV E/e' medial:  20.4 LV IVS:        1.10 cm   LV e' lateral:   7.51 cm/s LVOT diam:     2.30 cm   LV E/e' lateral: 16.2 LV SV:         100 LV SV Index:   48 LVOT Area:     4.15 cm  RIGHT VENTRICLE             IVC RV Basal diam:  4.10 cm     IVC diam: 2.90 cm RV Mid diam:    2.00 cm RV S prime:     17.00 cm/s TAPSE (M-mode): 2.0 cm LEFT ATRIUM              Index        RIGHT ATRIUM           Index LA diam:        5.60 cm  2.71 cm/m   RA Area:     17.20 cm LA Vol (A2C):    95.8 ml  46.39 ml/m  RA Volume:   45.00 ml  21.79 ml/m LA Vol (A4C):   114.0 ml 55.21 ml/m LA Biplane Vol: 106.0 ml 51.33 ml/m  AORTIC VALVE                     PULMONIC VALVE AV Area (Vmax):    3.46 cm      PV Vmax:       1.21 m/s AV Area (Vmean):   3.19 cm      PV Peak grad:  5.9 mmHg AV Area (VTI):     3.06 cm AV Vmax:           162.00 cm/s AV Vmean:          107.000 cm/s AV VTI:            0.327 m AV Peak Grad:  10.5 mmHg AV Mean Grad:      5.0 mmHg LVOT Vmax:         135.00 cm/s LVOT Vmean:        82.100 cm/s LVOT VTI:          0.241 m LVOT/AV VTI ratio: 0.74  AORTA Ao Root diam: 3.20 cm Ao Asc diam:  3.20 cm MITRAL VALVE                TRICUSPID VALVE MV Area (PHT): 1.69 cm     TR Peak grad:   36.7 mmHg MV Decel Time: 448 msec     TR Vmax:        303.00 cm/s MR Peak grad: 80.3 mmHg MR Vmax:      448.00 cm/s   SHUNTS MV E velocity: 122.00 cm/s  Systemic VTI:  0.24 m MV A velocity: 169.00 cm/s  Systemic Diam: 2.30 cm MV E/A ratio:  0.72 Kirk Ruths MD Electronically signed by Kirk Ruths MD Signature Date/Time: 11/23/2021/12:14:44 PM    Final    DG Chest Portable 1 View  Result Date: 11/23/2021 CLINICAL DATA:  Chest pain and shortness of breath. EXAM: PORTABLE CHEST 1 VIEW COMPARISON:  Chest radiograph 09/01/2021 FINDINGS: Stable heart size and mediastinal contours. Mild chronic hyperinflation and bronchial thickening. There are ill-defined patchy bibasilar opacities not seen on prior exam. No pulmonary edema, pleural effusion, or pneumothorax. Grossly stable osseous structures. IMPRESSION: Ill-defined patchy bibasilar opacities, not seen on prior exam. This may represent atelectasis, pneumonia, or combination thereof. Electronically Signed   By: Keith Rake M.D.   On: 11/23/2021 01:02     Assessment and Plan:   NSTEMI  - Patient presents complaining of sudden onset of substernal burning yesterday evening. Not relieved with antacids, not associated with other symptoms. Patient  denies having any chest pain with Afib episodes in the past  - hsTn 50>>1138>>4746>>6542  - Now on IV heparin  - Plan for LHC today - Continue statin  - Documented intolerance to ASA  - Patient has been NPO - Start metoprolol 12.5 mg BID  - Echocardiogram this admission showed EF 60-65%, mild LVH, grade I diastolic dysfunction, normal RV systolic function, mildly elevated pulmonary artery systolic pressure, severely dilated LA, severe mitral annular calcification  Paroxymal Atrial Fibrillation  - Patient did present in afib with RVR-- now back in sinus rhythm with frequent PACs  - PTA-- patient on eliquis. Last dose late yesterday evening. Now held  - Now on IV heparin  - Patient not on rate controlling medications. HR well controlled per telemetry  - Start metoprolol as above   Risk Assessment/Risk Scores:   TIMI Risk Score for Unstable Angina or Non-ST Elevation MI:   The patient's TIMI risk score is 2, which indicates a 8% risk of all cause mortality, new or recurrent myocardial infarction or need for urgent revascularization in the next 14 days.    CHA2DS2-VASc Score = 5   This indicates a 7.2% annual risk of stroke. The patient's score is based upon: CHF History: 0 HTN History: 0 Diabetes History: 0 Stroke History: 2 Vascular Disease History: 0 Age Score: 2 Gender Score: 1        For questions or updates, please contact [place] Please consult [email or web address] for contact info under    Signed, Margie Billet, PA-C  11/23/2021 1:17 PM

## 2021-11-23 NOTE — H&P (View-Only) (Signed)
Cardiology Consultation:   Patient ID: Laura Mendez MRN: [id number]; DOB: 1940-08-02  Admit date: 11/22/2021 Date of Consult: 11/23/2021  PCP:  Hoyt Koch, MD   [facility] Providers Cardiologist:  Candee Furbish, MD      Patient Profile:   Laura Mendez is a 81 y.o. female with a hx of P Atrial flutter since 2017 on eliquis, history of TIA, no prior hx of CAD who is being seen 11/23/2021 for the evaluation of chest pain, NSTEMI at the request of Dr. Tawanna Solo.  History of Present Illness:   Laura Mendez is an 81 year old female with above medical history who has been followed by Dr. Marlou Porch. Patient was first diagnosed with atrial flutter in 12/2015, auto converted after receiving 5 mg of metoprolol. Echocardiogram in 12/2015 showed EF 55-60%, no regional wall motion abnormalities, grade I diastolic dysfunction, severely calcified mitral valve,severely dilated LA. She was started on cardizem, but this was stopped due to rash. Does not appear to be on any rate controlling medications. She has been on eliquis for anticoagulation.   Patient was recently admitted from 2/5-2/22 for treatment of a CVA. Imaging showed multiple vascular territories bilaterally. Strong suspicion for embolic phenomenon despite adherence to xarelto. Echocardiogram on 06/29/21 showed EF 60-73%, grade I diastolic dysfunction, moderately dilated AL, moderate mitral calcification.   Patient presented to the ED around midnight on 7/3 complaining of epigastric CP that began about an hour prior to presentation. Labs in the ED showed Ha 137, K 4.1, creatinine 1.19, WBC 14.7, hemoglobin 15.4, platelets 401. hsTn 50>>1138>>4746>>6542   CXR showed ill-defined patchy bibasilar opacities. EKG showed atrial fibrillation, HR 156 BPM. Patient was given a 5 mg IV dose of lopressor. Follow up ekg showed sinus rhythm with frequent PVCs, HR in the 90s.   Patient took her eliquis immediately before coming to the ED,  late in the evening on 7/2.   On interview, patient reports that she was getting ready for bed last night when she felt a sudden onset of chest pain. Felt like a burning feeling, not relieved with antiacids. Did not radiate. Denies any sob, palpitations, nausea, dizziness. Denies ever having similar chest pain prior. Does have a history of afib, denies any history of CAD or heart failure.  She was able to drive herself to the ED. Has not smoked any cigarettes since the 1970s, denies history of HLD, HTN, diabetes. Does not have a family history of heart disease.    Past Medical History:  Diagnosis Date   Asthma    Chronic airway obstruction, not elsewhere classified    Diastolic dysfunction without heart failure    Disorder of bone and cartilage, unspecified    Dysrhythmia    A-Fib   Family history of colon cancer    Family history of kidney cancer    Family history of ovarian cancer    Paroxysmal atrial flutter (HCC)    Stroke ([place])    TIA   TIA (transient ischemic attack)    Unspecified asthma(493.90)     Past Surgical History:  Procedure Laterality Date   ABDOMINAL HYSTERECTOMY     BREAST LUMPECTOMY Right 12/30/2017   BREAST LUMPECTOMY WITH RADIOACTIVE SEED LOCALIZATION Right 12/30/2017   Procedure: BREAST LUMPECTOMY WITH RADIOACTIVE SEED LOCALIZATION X'S 3;  Surgeon: Rolm Bookbinder, MD;  Location: [place];  Service: General;  Laterality: Right;   BUNIONECTOMY Bilateral    EYE SURGERY Bilateral    cataract removal     Home Medications:  Prior to Admission medications   Medication Sig Start Date End Date Taking? Authorizing Provider  acetaminophen (TYLENOL) 325 MG tablet Take 2 tablets (650 mg total) by mouth every 4 (four) hours as needed for mild pain (or temp > 37.5 C (99.5 F)). Patient taking differently: Take 650 mg by mouth daily as needed for headache. 07/14/21  Yes Angiulli, Lavon Paganini, PA-C  albuterol (VENTOLIN HFA) 108 (90 Base) MCG/ACT inhaler INHALE 2 PUFFS BY MOUTH  EVERY 4 HOURS AS NEEDED Patient taking differently: Inhale 2 puffs into the lungs as needed (asthma). 07/31/21  Yes Young, Tarri Fuller D, MD  apixaban (ELIQUIS) 5 MG TABS tablet Take 1 tablet (5 mg total) by mouth 2 (two) times daily. 11/20/21  Yes Hoyt Koch, MD  atorvastatin (LIPITOR) 20 MG tablet Take 1 tablet by mouth daily.   Yes [provider]  Calcium Carbonate Antacid (TUMS PO) Take 0.5 tablets by mouth at bedtime as needed (stomach upset).   Yes [provider]  DUPIXENT 300 MG/2ML SOPN Inject 300 mg into the skin every 14 (fourteen) days. 10/30/21  Yes [provider]  Fluticasone-Salmeterol (WIXELA INHUB) 250-50 MCG/DOSE AEPB Inhale 1 puff into the lungs 2 (two) times daily. Patient taking differently: Inhale 1 puff into the lungs daily. 07/24/20  Yes Young, Tarri Fuller D, MD  Ketotifen Fumarate (ZADITOR OP) Place 1 drop into both eyes at bedtime as needed (allergies).   Yes [provider]  letrozole (FEMARA) 2.5 MG tablet Take 1 tablet (2.5 mg total) by mouth daily. 10/05/21  Yes Nicholas Lose, MD  Multiple Vitamin (MULTIVITAMIN) tablet Take 1 tablet by mouth daily.   Yes [provider]  pantoprazole (PROTONIX) 40 MG tablet Take 1 tablet (40 mg total) by mouth daily. 08/17/21  Yes Hoyt Koch, MD  predniSONE (STERAPRED UNI-PAK 21 TAB) 10 MG (21) TBPK tablet Take by mouth daily. Patient taking differently: Take 20 mg by mouth in the morning, at noon, and at bedtime. 10/21/21  Yes Henson, Vickie L, NP-C  PRESCRIPTION MEDICATION Inhale 1 ampule into the lungs daily as needed (SOB). Nebulizer solution   Yes [provider]  VITAMIN D PO Take 1 tablet by mouth daily.   Yes [provider]    Inpatient Medications: Scheduled Meds:  aspirin  325 mg Oral Daily   atorvastatin  80 mg Oral Daily   insulin aspart  0-6 Units Subcutaneous Q4H   letrozole  2.5 mg Oral Daily   mepolizumab  100 mg Subcutaneous Q28 days    mometasone-formoterol  2 puff Inhalation BID   pantoprazole  40 mg Oral Daily   Continuous Infusions:  sodium chloride 75 mL/hr at 11/23/21 0621   heparin 1,000 Units/hr (11/23/21 0625)   PRN Meds: acetaminophen, albuterol, nitroGLYCERIN, ondansetron (ZOFRAN) IV, oxyCODONE  Allergies:    Allergies  Allergen Reactions   Clarithromycin Anaphylaxis    "just about killed me"   Ibuprofen Hives   Aspirin Hives   Methotrexate Other (See Comments)     tongue swelling and mouth soreness   Codeine Nausea And Vomiting   Codeine Phosphate Nausea And Vomiting   Diltiazem Rash   Tape Itching and Other (See Comments)    Tears skin   Tapentadol Itching    Other reaction(s): Other (See Comments) Tears skin   Verapamil Hcl Er Rash    Social History:   Social History   Socioeconomic History   Marital status: Divorced    Spouse name: Not on file   Number  of children: Not on file   Years of education: Not on file   Highest education level: Not on file  Occupational History   Occupation: retired  Tobacco Use   Smoking status: Former    Years: 0.00    Types: Cigarettes    Quit date: 05/24/1977    Years since quitting: 44.5   Smokeless tobacco: Never   Tobacco comments:    7/7 pt states she occasionally smoked, let cigs "burn" more than she smoked them  Vaping Use   Vaping Use: Never used  Substance and Sexual Activity   Alcohol use: No   Drug use: No   Sexual activity: Not on file  Other Topics Concern   Not on file  Social History Narrative   Right handed   1 coke per day   Lives alone   Social Determinants of Health   Financial Resource Strain: Low Risk  (11/16/2021)   Overall Financial Resource Strain (CARDIA)    Difficulty of Paying Living Expenses: Not hard at all  Food Insecurity: No Food Insecurity (11/16/2021)   Hunger Vital Sign    Worried About Running Out of Food in the Last Year: Never true    Ran Out of Food in the Last Year: Never true  Transportation Needs:  No Transportation Needs (11/16/2021)   PRAPARE - Hydrologist (Medical): No    Lack of Transportation (Non-Medical): No  Physical Activity: Insufficiently Active (11/16/2021)   Exercise Vital Sign    Days of Exercise per Week: 2 days    Minutes of Exercise per Session: 30 min  Stress: No Stress Concern Present (11/16/2021)   [place]    Feeling of Stress : Not at all  Social Connections: Moderately Isolated (11/16/2021)   Social Connection and Isolation Panel [NHANES]    Frequency of Communication with Friends and Family: Three times a week    Frequency of Social Gatherings with Friends and Family: Three times a week    Attends Religious Services: More than 4 times per year    Active Member of Clubs or Organizations: No    Attends Archivist Meetings: Never    Marital Status: Divorced  Human resources officer Violence: Not At Risk (11/16/2021)   Humiliation, Afraid, Rape, and Kick questionnaire    Fear of Current or Ex-Partner: No    Emotionally Abused: No    Physically Abused: No    Sexually Abused: No    Family History:    Family History  Problem Relation Age of Onset   COPD Father    Colon cancer Mother 21       mets to pancreas and liver   Kidney cancer Sister 62       d. 67   Lung cancer Sister        d. 48, smoker   Ovarian cancer Sister 73       d. 38   Cirrhosis Sister        d. 76   Melanoma Sister 24   Ovarian cancer Maternal Grandmother        d. 24   Heart disease Maternal Grandfather        rheumatic heart disease   Rectal cancer Paternal Grandfather        d. 75   Parkinson's disease Brother    Ovarian cancer Maternal Aunt    Colon cancer Maternal Uncle        d.  72-73   Heart attack Paternal Uncle        d. 45   Kidney cancer Brother 86   Colon cancer Maternal Uncle        d. 48   Cancer Other        MGMs pat 1/2 sister with uterine and rectal cancer    Colon cancer Cousin        d. 86 - mat first cousin   Breast cancer Neg Hx      ROS:  Please see the history of present illness.   All other ROS reviewed and negative.     Physical Exam/Data:   Vitals:   11/23/21 1115 11/23/21 1145 11/23/21 1200 11/23/21 1215  BP: 138/61 (!) 125/56 123/72 (!) 111/54  Pulse: (!) 43 (!) 43 66 73  Resp: 20 (!) '22 17 16  '$ Temp:      TempSrc:      SpO2: 95% 95% 93% 93%  Weight:      Height:       No intake or output data in the 24 hours ending 11/23/21 1317    11/23/2021    2:51 AM 11/19/2021   11:08 AM 10/21/2021   11:16 AM  Last 3 Weights  Weight (lbs) 195 lb 199 lb 14.4 oz 195 lb  Weight (kg) 88.451 kg 90.674 kg 88.451 kg     Body mass index is 27.98 kg/m.  General:  Well nourished, well developed, in no acute distress HEENT: normal Neck: no JVD Vascular: Radial pulses 2+ bilaterally Cardiac:  normal S1, S2; irregular rate and rhythm  Lungs:  clear to auscultation bilaterally, no wheezing, rhonchi or rales  Abd: soft, nontender, no hepatomegaly  Ext: no edema Musculoskeletal:  No deformities, BUE and BLE strength normal and equal Skin: warm and dry  Neuro:  CNs 2-12 intact, no focal abnormalities noted Psych:  Normal affect   EKG:  The EKG was personally reviewed and demonstrates:  sinus arrhythmia, LVH Telemetry:  Telemetry was personally reviewed and demonstrates:  Sinus rhythm with frequent PVCs   Relevant CV Studies:  Echocardiogram 11/23/21 1. Left ventricular ejection fraction, by estimation, is 60 to 65%. The  left ventricle has normal function. The left ventricle has no regional  wall motion abnormalities. There is mild left ventricular hypertrophy.  Left ventricular diastolic parameters  are consistent with Grade I diastolic dysfunction (impaired relaxation).  Elevated left atrial pressure.   2. Right ventricular systolic function is normal. The right ventricular  size is normal. There is mildly elevated pulmonary  artery systolic  pressure.   3. Left atrial size was severely dilated.   4. The mitral valve is normal in structure. Trivial mitral valve  regurgitation. No evidence of mitral stenosis. Severe mitral annular  calcification.   5. The aortic valve is tricuspid. Aortic valve regurgitation is not  visualized. Aortic valve sclerosis is present, with no evidence of aortic  valve stenosis.   6. The inferior vena cava is dilated in size with >50% respiratory  variability, suggesting right atrial pressure of 8 mmHg.   Laboratory Data:  High Sensitivity Troponin:   Recent Labs  Lab 11/23/21 0016 11/23/21 0245 11/23/21 0600 11/23/21 0911  TROPONINIHS 50* 1,138* 4,746* 6,542*     Chemistry Recent Labs  Lab 11/23/21 0016  NA 137  K 4.1  CL 102  CO2 20*  GLUCOSE 192*  BUN 25*  CREATININE 1.19*  CALCIUM 9.4  GFRNONAA 46*  ANIONGAP 15    No results  for input(s): "PROT", "ALBUMIN", "AST", "ALT", "ALKPHOS", "BILITOT" in the last 168 hours. Lipids No results for input(s): "CHOL", "TRIG", "HDL", "LABVLDL", "LDLCALC", "CHOLHDL" in the last 168 hours.  Hematology Recent Labs  Lab 11/23/21 0016  WBC 14.7*  RBC 4.95  HGB 15.4*  HCT 45.6  MCV 92.1  MCH 31.1  MCHC 33.8  RDW 14.7  PLT 401*   Thyroid No results for input(s): "TSH", "FREET4" in the last 168 hours.  BNPNo results for input(s): "BNP", "PROBNP" in the last 168 hours.  DDimer No results for input(s): "DDIMER" in the last 168 hours.   Radiology/Studies:  ECHOCARDIOGRAM COMPLETE  Result Date: 11/23/2021    ECHOCARDIOGRAM REPORT   Patient Name:   Laura Mendez Date of Exam: 11/23/2021 Medical Rec #:  [id number]              Height:       70.0 in Accession #:    [id number]             Weight:       195.0 lb Date of Birth:  27-Jul-1940               BSA:          2.065 m Patient Age:    2 years               BP:           119/62 mmHg Patient Gender: F                      HR:           87 bpm. Exam Location:  Inpatient  Procedure: 2D Echo, Cardiac Doppler and Color Doppler Indications:    NSTEMI  History:        Patient has prior history of Echocardiogram examinations, most                 recent 06/29/2021. Arrythmias:Atrial Fibrillation;                 Signs/Symptoms:Chest Pain.  Sonographer:    Joette Catching RCS Referring Phys: [phone] [place]  1. Left ventricular ejection fraction, by estimation, is 60 to 65%. The left ventricle has normal function. The left ventricle has no regional wall motion abnormalities. There is mild left ventricular hypertrophy. Left ventricular diastolic parameters are consistent with Grade I diastolic dysfunction (impaired relaxation). Elevated left atrial pressure.  2. Right ventricular systolic function is normal. The right ventricular size is normal. There is mildly elevated pulmonary artery systolic pressure.  3. Left atrial size was severely dilated.  4. The mitral valve is normal in structure. Trivial mitral valve regurgitation. No evidence of mitral stenosis. Severe mitral annular calcification.  5. The aortic valve is tricuspid. Aortic valve regurgitation is not visualized. Aortic valve sclerosis is present, with no evidence of aortic valve stenosis.  6. The inferior vena cava is dilated in size with >50% respiratory variability, suggesting right atrial pressure of 8 mmHg. FINDINGS  Left Ventricle: Left ventricular ejection fraction, by estimation, is 60 to 65%. The left ventricle has normal function. The left ventricle has no regional wall motion abnormalities. The left ventricular internal cavity size was normal in size. There is  mild left ventricular hypertrophy. Left ventricular diastolic parameters are consistent with Grade I diastolic dysfunction (impaired relaxation). Elevated left atrial pressure. Right Ventricle: The right ventricular size is normal. Right ventricular systolic function is normal. There  is mildly elevated pulmonary artery systolic pressure. The  tricuspid regurgitant velocity is 3.03 m/s, and with an assumed right atrial pressure of 8 mmHg, the estimated right ventricular systolic pressure is 79.8 mmHg. Left Atrium: Left atrial size was severely dilated. Right Atrium: Right atrial size was normal in size. Pericardium: There is no evidence of pericardial effusion. Mitral Valve: The mitral valve is normal in structure. Severe mitral annular calcification. Trivial mitral valve regurgitation. No evidence of mitral valve stenosis. Tricuspid Valve: The tricuspid valve is normal in structure. Tricuspid valve regurgitation is mild . No evidence of tricuspid stenosis. Aortic Valve: The aortic valve is tricuspid. Aortic valve regurgitation is not visualized. Aortic valve sclerosis is present, with no evidence of aortic valve stenosis. Aortic valve mean gradient measures 5.0 mmHg. Aortic valve peak gradient measures 10.5 mmHg. Aortic valve area, by VTI measures 3.06 cm. Pulmonic Valve: The pulmonic valve was normal in structure. Pulmonic valve regurgitation is trivial. No evidence of pulmonic stenosis. Aorta: The aortic root is normal in size and structure. Venous: The inferior vena cava is dilated in size with greater than 50% respiratory variability, suggesting right atrial pressure of 8 mmHg. IAS/Shunts: No atrial level shunt detected by color flow Doppler.  LEFT VENTRICLE PLAX 2D LVIDd:         5.00 cm   Diastology LVIDs:         3.40 cm   LV e' medial:    5.98 cm/s LV PW:         1.20 cm   LV E/e' medial:  20.4 LV IVS:        1.10 cm   LV e' lateral:   7.51 cm/s LVOT diam:     2.30 cm   LV E/e' lateral: 16.2 LV SV:         100 LV SV Index:   48 LVOT Area:     4.15 cm  RIGHT VENTRICLE             IVC RV Basal diam:  4.10 cm     IVC diam: 2.90 cm RV Mid diam:    2.00 cm RV S prime:     17.00 cm/s TAPSE (M-mode): 2.0 cm LEFT ATRIUM              Index        RIGHT ATRIUM           Index LA diam:        5.60 cm  2.71 cm/m   RA Area:     17.20 cm LA Vol (A2C):    95.8 ml  46.39 ml/m  RA Volume:   45.00 ml  21.79 ml/m LA Vol (A4C):   114.0 ml 55.21 ml/m LA Biplane Vol: 106.0 ml 51.33 ml/m  AORTIC VALVE                     PULMONIC VALVE AV Area (Vmax):    3.46 cm      PV Vmax:       1.21 m/s AV Area (Vmean):   3.19 cm      PV Peak grad:  5.9 mmHg AV Area (VTI):     3.06 cm AV Vmax:           162.00 cm/s AV Vmean:          107.000 cm/s AV VTI:            0.327 m AV Peak Grad:  10.5 mmHg AV Mean Grad:      5.0 mmHg LVOT Vmax:         135.00 cm/s LVOT Vmean:        82.100 cm/s LVOT VTI:          0.241 m LVOT/AV VTI ratio: 0.74  AORTA Ao Root diam: 3.20 cm Ao Asc diam:  3.20 cm MITRAL VALVE                TRICUSPID VALVE MV Area (PHT): 1.69 cm     TR Peak grad:   36.7 mmHg MV Decel Time: 448 msec     TR Vmax:        303.00 cm/s MR Peak grad: 80.3 mmHg MR Vmax:      448.00 cm/s   SHUNTS MV E velocity: 122.00 cm/s  Systemic VTI:  0.24 m MV A velocity: 169.00 cm/s  Systemic Diam: 2.30 cm MV E/A ratio:  0.72 Kirk Ruths MD Electronically signed by Kirk Ruths MD Signature Date/Time: 11/23/2021/12:14:44 PM    Final    DG Chest Portable 1 View  Result Date: 11/23/2021 CLINICAL DATA:  Chest pain and shortness of breath. EXAM: PORTABLE CHEST 1 VIEW COMPARISON:  Chest radiograph 09/01/2021 FINDINGS: Stable heart size and mediastinal contours. Mild chronic hyperinflation and bronchial thickening. There are ill-defined patchy bibasilar opacities not seen on prior exam. No pulmonary edema, pleural effusion, or pneumothorax. Grossly stable osseous structures. IMPRESSION: Ill-defined patchy bibasilar opacities, not seen on prior exam. This may represent atelectasis, pneumonia, or combination thereof. Electronically Signed   By: Keith Rake M.D.   On: 11/23/2021 01:02     Assessment and Plan:   NSTEMI  - Patient presents complaining of sudden onset of substernal burning yesterday evening. Not relieved with antacids, not associated with other symptoms. Patient  denies having any chest pain with Afib episodes in the past  - hsTn 50>>1138>>4746>>6542  - Now on IV heparin  - Plan for LHC today - Continue statin  - Documented intolerance to ASA  - Patient has been NPO - Start metoprolol 12.5 mg BID  - Echocardiogram this admission showed EF 60-65%, mild LVH, grade I diastolic dysfunction, normal RV systolic function, mildly elevated pulmonary artery systolic pressure, severely dilated LA, severe mitral annular calcification  Paroxymal Atrial Fibrillation  - Patient did present in afib with RVR-- now back in sinus rhythm with frequent PACs  - PTA-- patient on eliquis. Last dose late yesterday evening. Now held  - Now on IV heparin  - Patient not on rate controlling medications. HR well controlled per telemetry  - Start metoprolol as above   Risk Assessment/Risk Scores:   TIMI Risk Score for Unstable Angina or Non-ST Elevation MI:   The patient's TIMI risk score is 2, which indicates a 8% risk of all cause mortality, new or recurrent myocardial infarction or need for urgent revascularization in the next 14 days.    CHA2DS2-VASc Score = 5   This indicates a 7.2% annual risk of stroke. The patient's score is based upon: CHF History: 0 HTN History: 0 Diabetes History: 0 Stroke History: 2 Vascular Disease History: 0 Age Score: 2 Gender Score: 1        For questions or updates, please contact [place] Please consult [email or web address] for contact info under    Signed, Margie Billet, PA-C  11/23/2021 1:17 PM

## 2021-11-24 DIAGNOSIS — R739 Hyperglycemia, unspecified: Secondary | ICD-10-CM | POA: Diagnosis not present

## 2021-11-24 DIAGNOSIS — I5032 Chronic diastolic (congestive) heart failure: Secondary | ICD-10-CM | POA: Diagnosis not present

## 2021-11-24 DIAGNOSIS — Z7901 Long term (current) use of anticoagulants: Secondary | ICD-10-CM | POA: Diagnosis not present

## 2021-11-24 DIAGNOSIS — J45909 Unspecified asthma, uncomplicated: Secondary | ICD-10-CM | POA: Diagnosis not present

## 2021-11-24 DIAGNOSIS — R778 Other specified abnormalities of plasma proteins: Secondary | ICD-10-CM | POA: Diagnosis not present

## 2021-11-24 DIAGNOSIS — I21A1 Myocardial infarction type 2: Secondary | ICD-10-CM | POA: Diagnosis not present

## 2021-11-24 DIAGNOSIS — I480 Paroxysmal atrial fibrillation: Secondary | ICD-10-CM | POA: Diagnosis not present

## 2021-11-24 DIAGNOSIS — N179 Acute kidney failure, unspecified: Secondary | ICD-10-CM | POA: Diagnosis not present

## 2021-11-24 DIAGNOSIS — Z853 Personal history of malignant neoplasm of breast: Secondary | ICD-10-CM | POA: Diagnosis not present

## 2021-11-24 DIAGNOSIS — Z8673 Personal history of transient ischemic attack (TIA), and cerebral infarction without residual deficits: Secondary | ICD-10-CM | POA: Diagnosis not present

## 2021-11-24 DIAGNOSIS — Z79899 Other long term (current) drug therapy: Secondary | ICD-10-CM | POA: Diagnosis not present

## 2021-11-24 DIAGNOSIS — I214 Non-ST elevation (NSTEMI) myocardial infarction: Secondary | ICD-10-CM | POA: Diagnosis not present

## 2021-11-24 DIAGNOSIS — Z170 Estrogen receptor positive status [ER+]: Secondary | ICD-10-CM | POA: Diagnosis not present

## 2021-11-24 LAB — LIPID PANEL
Cholesterol: 129 mg/dL (ref 0–200)
HDL: 42 mg/dL (ref >40)
LDL Cholesterol: 69 mg/dL (ref 0–99)
Total CHOL/HDL Ratio: 3.1 RATIO
Triglycerides: 90 mg/dL (ref <150)
VLDL: 18 mg/dL (ref 0–40)

## 2021-11-24 LAB — BASIC METABOLIC PANEL
Anion gap: 8 (ref 5–15)
BUN: 22 mg/dL (ref 8–23)
CO2: 19 mmol/L — ABNORMAL LOW (ref 22–32)
Calcium: 8.2 mg/dL — ABNORMAL LOW (ref 8.9–10.3)
Chloride: 114 mmol/L — ABNORMAL HIGH (ref 98–111)
Creatinine, Ser: 0.73 mg/dL (ref 0.44–1.00)
GFR, Estimated: >60 mL/min (ref >60)
Glucose, Bld: 93 mg/dL (ref 70–99)
Potassium: 4.5 mmol/L (ref 3.5–5.1)
Sodium: 141 mmol/L (ref 135–145)

## 2021-11-24 LAB — CBC
HCT: 42.3 % (ref 36.0–46.0)
Hemoglobin: 13.4 g/dL (ref 12.0–15.0)
MCH: 29.8 pg (ref 26.0–34.0)
MCHC: 31.7 g/dL (ref 30.0–36.0)
MCV: 94.2 fL (ref 80.0–100.0)
Platelets: 283 10*3/uL (ref 150–400)
RBC: 4.49 MIL/uL (ref 3.87–5.11)
RDW: 15.3 % (ref 11.5–15.5)
WBC: 10.5 10*3/uL (ref 4.0–10.5)
nRBC: 0 % (ref 0.0–0.2)

## 2021-11-24 LAB — APTT: aPTT: 54 seconds — ABNORMAL HIGH (ref 24–36)

## 2021-11-24 LAB — LIPOPROTEIN A (LPA): Lipoprotein (a): 13.5 nmol/L (ref <75.0)

## 2021-11-24 MED ORDER — METOPROLOL SUCCINATE ER 25 MG PO TB24: 12.5000 mg | ORAL_TABLET | Freq: Every day | ORAL | 0 refills | Status: DC

## 2021-11-24 MED ORDER — APIXABAN 5 MG PO TABS
5.0000 mg | ORAL_TABLET | Freq: Two times a day (BID) | ORAL | Status: DC
  Administered 2021-11-24: 5 mg via ORAL
  Filled 2021-11-24: qty 1

## 2021-11-24 NOTE — TOC Transition Note (Signed)
Transition of Care [facility]) - CM/SW Discharge Note   Patient Details  Name: Laura Mendez MRN: [id number] Date of Birth: February 26, 1941  [facility]) CM/SW Contact:  Pollie Friar, RN Phone Number: 11/24/2021, 11:57 AM   Clinical Narrative:    Pt discharging home with self care.  Pt has: stair lift and cane at home Pt drives self but sons can assist.  Pt manages her own medications. She did run out of eliquis d/t not being reordered from PCP in timely manner. Son is going to talk with MD office and make sure this doesn't happen again.  Son transporting home.   Final next level of care: Home/Self Care Barriers to Discharge: No Barriers Identified   Patient Goals and CMS Choice        Discharge Placement                       Discharge Plan and Services                                     Social Determinants of Health (SDOH) Interventions     Readmission Risk Interventions     No data to display

## 2021-11-24 NOTE — Progress Notes (Signed)
Progress Note  Patient Name: Laura Mendez Date of Encounter: 11/24/2021  [facility] Cardiologist: Candee Furbish, MD   Patient Profile     81 y.o. female with a hx of P Atrial flutter since 2017 on eliquis, history of TIA, no prior hx of CAD who is being seen 11/23/2021 for the evaluation of chest pain, -ELEVATED TROPONIN (Type IIa  MI - DEMAND ISCHEMIA) at the request of Dr. Tawanna Solo. => Presented with acute onset substernal chest pain without sensation of palpitations.  Noted to be in A-fib RVR and subsequently converted to NSR.  Had never had chest pain before with arrhythmia.  Pain-free upon conversion.  Troponin increased to 6600 => referred for cardiac catheterization revealing minimal CAD.  This would argue not non-STEMI, but Type IV MI From Demand Ischemia and Tachycardia.  Assessment & Plan    Elevated Troponin-Demand Ischemia (not non-STEMI) Notable troponin elevation, but no STEMI disease on CAD.  This would argue against ACS presentation, likely demand ischemia.  Especially given no regional wall motion on regular echo. Started on low-dose beta-blocker Plan will be continued rate control. Continue statin  (PAF) with A-fib RVR on presentation -> back in sinus rhythm Started on low-dose beta-blocker, but previously not needed much rate control. - has been bradycardic - probably can reduce dose & consolidate to Toprol 12.5 mg daily. (1/2 of 25 mg tablet) If episodes recur, may need to consider AAD Converted from IV heparin to Eliquis.  Long-term anticoagulation: CHA2DS2-VASc score 5-age x2, stroke x2, gender x1.  Anticipate d/c today  [facility] will sign off.   Medication Recommendations:  would convert to Toprol 12.5 mg daily - has been bradycardic, but now rates in 70s with frequent PACs Other recommendations (labs, testing, etc):  n/a Follow up as an outpatient:  will arrange f/u with Dr. Marlou Porch or  APP  ===================================================================  Subjective   Feels well - no further CP or SOB.  HR stable - no sensation of PACs noted on monitor  Inpatient Medications    Scheduled Meds:  apixaban  5 mg Oral BID   atorvastatin  80 mg Oral Daily   letrozole  2.5 mg Oral Daily   mometasone-formoterol  2 puff Inhalation BID   pantoprazole  40 mg Oral Daily   sodium chloride flush  3 mL Intravenous Q12H   Continuous Infusions:  sodium chloride     PRN Meds: sodium chloride, acetaminophen, albuterol, nitroGLYCERIN, ondansetron (ZOFRAN) IV, oxyCODONE, sodium chloride flush   Vital Signs    Vitals:   11/24/21 0213 11/24/21 0500 11/24/21 0559 11/24/21 0736  BP: (!) 145/72  138/90   Pulse: (!) 41  65   Resp: 18     Temp: 98.1 F (36.7 C)  98 F (36.7 C)   TempSrc: Oral  Oral   SpO2: 95%  96% 95%  Weight:  90.4 kg    Height:        Intake/Output Summary (Last 24 hours) at 11/24/2021 1000 Last data filed at 11/24/2021 0830 Gross per 24 hour  Intake 1149.7 ml  Output 1200 ml  Net -50.3 ml      11/24/2021    5:00 AM 11/23/2021    2:51 AM 11/19/2021   11:08 AM  Last 3 Weights  Weight (lbs) 199 lb 4.7 oz 195 lb 199 lb 14.4 oz  Weight (kg) 90.4 kg 88.451 kg 90.674 kg      Telemetry    SR with frequent PACs - rates in 70s  -  Personally Reviewed  ECG    SR-72 with frequent PACS, LVH with repolarization changes (ST-T changes in Lat Leads - - likely repolarization changes  - Personally Reviewed  Physical Exam   GEN: No acute distress.   Neck: No JVD Cardiac: RRR with freqent ectopy, Soft 1/6 SEM @ RUSB, NO rubs, or gallops.  Respiratory: Clear to auscultation bilaterally. Non-labored, good air movement  GI: Soft, nontender, non-distended  MS: No edema; No deformity. Neuro:  Nonfocal  Psych: Normal affect   Labs    High Sensitivity Troponin:   Recent Labs  Lab 11/23/21 0016 11/23/21 0245 11/23/21 0600 11/23/21 0911  TROPONINIHS 50*  1,138* 4,746* 6,542*     Chemistry Recent Labs  Lab 11/23/21 0016 11/24/21 0203  NA 137 141  K 4.1 4.5  CL 102 114*  CO2 20* 19*  GLUCOSE 192* 93  BUN 25* 22  CREATININE 1.19* 0.73  CALCIUM 9.4 8.2*  GFRNONAA 46* >60  ANIONGAP 15 8    Lipids  Recent Labs  Lab 11/24/21 0203  CHOL 129  TRIG 90  HDL 42  LDLCALC 69  CHOLHDL 3.1    Hematology Recent Labs  Lab 11/23/21 0016 11/24/21 0203  WBC 14.7* 10.5  RBC 4.95 4.49  HGB 15.4* 13.4  HCT 45.6 42.3  MCV 92.1 94.2  MCH 31.1 29.8  MCHC 33.8 31.7  RDW 14.7 15.3  PLT 401* 283   Thyroid No results for input(s): "TSH", "FREET4" in the last 168 hours.  BNPNo results for input(s): "BNP", "PROBNP" in the last 168 hours.  DDimer No results for input(s): "DDIMER" in the last 168 hours.   Radiology    DG Chest Portable 1 View  Result Date: 11/23/2021 CLINICAL DATA:  Chest pain and shortness of breath. EXAM: PORTABLE CHEST 1 VIEW COMPARISON:  Chest radiograph 09/01/2021 FINDINGS: Stable heart size and mediastinal contours. Mild chronic hyperinflation and bronchial thickening. There are ill-defined patchy bibasilar opacities not seen on prior exam. No pulmonary edema, pleural effusion, or pneumothorax. Grossly stable osseous structures. IMPRESSION: Ill-defined patchy bibasilar opacities, not seen on prior exam. This may represent atelectasis, pneumonia, or combination thereof. Electronically Signed   By: Keith Rake M.D.   On: 11/23/2021 01:02    Cardiac Studies   Cardiac Cath 11/23/2021: Patent coronary arteries, right dominant.  Mild irregularities with no significant stenosis.  Normal LVEDP.  Suspect demand ischemia. => Recommended resuming heparin overnight and transition back to apixaban in the morning. TTE 11/23/2021: EF 60 to 65%.  No RWMA.  GR 1 DD.  Elevated LAP.  Mildly elevated PAP with severely dilated LA.  Severe MAC.  Trivial MR with no MS.  AOV sclerosis with no stenosis.  Mildly elevated RAP.  (No change)   For  questions or updates, please contact [place] Please consult [email or web address] for contact info under        Signed, Glenetta Hew, MD  11/24/2021, 10:00 AM

## 2021-11-24 NOTE — Discharge Summary (Addendum)
Physician Discharge Summary  Laura Mendez HEN:[id number] DOB: [birth year] DOA: 11/22/2021  PCP: Laura Koch, MD  Admit date: 11/22/2021 Discharge date: 11/24/2021  Admitted From: Home Disposition:  Home  Discharge Condition:Stable CODE STATUS:FULL Diet recommendation: Heart Healthy   Brief/Interim Summary:  Patient is 81 year old female with history of asthma, right breast cancer s/p lumpectomy on letrozole, paroxysmal A-fib on Eliquis, history of CVA, CKD stage II who presented from home with chest Mendez.  She drove herself here.  She was complaining of burning sensation on the upper chest on presentation she was hemodynamically stable.  EKG showed A-fib with RVR.  Lab work showed leukocytosis of 14.7.  Troponin showed gradual increment with positive delta.  Patient was admitted for further management, cardiology consulted.  Seen on cardiac cath without finding of any significant stenosis, coronary arteries were patent.  Demand ischemia suspected for the elevation of troponin.  Medically stable for discharge today.  Following problems were addressed during her hospitalization:  1. Chest Mendez, elevated troponin  - Presents with chest Mendez, relieved with fentanyl in ED, and has troponin elevation  - Cardiology consulted by ED PA  - She recieved beta-blocker in ED and ASA was ordered  -Underwent cardiac cath without finding of significant stenosis, patent coronaries -Elevated troponin could be most likely from supply demand ischemia  -Echo showed EF of 60 to 36%, grade 1 diastolic dysfunction.  2. PAF with RVR  - In a fib RVR in ED with rate as high as 150s, down to 70s after 5 mg IV Lopressor   - Not on rate-control agent at home  - Continue eliquis.  Started on low-dose metoprolol   3. Asthma  - Stable, no cough or wheeze on admission  - Continue home medications   4. 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 AKI on CKD II  - SCr is 1.19 on admission, up from baseline closer to 0.8  -Currently kidney function at baseline  6. Hyperglycemia  - Serum glucose 192 in ED without hx of DM  -A1c of 5.9.  Will recommend to monitor blood sugars at home    Discharge Diagnoses:  Principal Problem:   NSTEMI (non-ST elevated myocardial infarction) ([place]) Active Problems:   Asthma with COPD ([place])   Paroxysmal atrial fibrillation with RVR (HCC)   Malignant neoplasm of lower-outer quadrant of right breast of female, estrogen receptor positive (HCC)   Chronic diastolic CHF (congestive heart failure) (HCC)   Acute renal failure superimposed on stage 2 chronic kidney disease ([place])    Discharge Instructions  Discharge Instructions     Diet - low sodium heart healthy   Complete by: As directed    Discharge instructions   Complete by: As directed    1) Please follow up with your PCP and cardiologist as an outpatient.   Increase activity slowly   Complete by: As directed       Allergies as of 11/24/2021       Reactions   Clarithromycin Anaphylaxis   "just about killed me"   Ibuprofen Hives   Aspirin Hives   Methotrexate Other (See Comments)    tongue swelling and mouth soreness   Codeine Nausea And Vomiting   Codeine Phosphate Nausea And Vomiting   Diltiazem Rash   Tape Itching, Other (See Comments)   Tears skin   Tapentadol Itching  Other reaction(s): Other (See Comments) Tears skin   Verapamil Hcl Er Rash        Medication List     TAKE these medications    acetaminophen 325 MG tablet Commonly known as: TYLENOL Take 2 tablets (650 mg total) by mouth every 4 (four) hours as needed for mild Mendez (or temp > 37.5 C (99.5 F)). What changed:  when to take this reasons to take this   albuterol 108 (90 Base) MCG/ACT inhaler Commonly known as: VENTOLIN HFA INHALE 2 PUFFS BY MOUTH EVERY 4 HOURS AS NEEDED What changed:  how much to take how to take  this when to take this reasons to take this additional instructions   apixaban 5 MG Tabs tablet Commonly known as: ELIQUIS Take 1 tablet (5 mg total) by mouth 2 (two) times daily.   atorvastatin 20 MG tablet Commonly known as: LIPITOR Take 1 tablet by mouth daily.   Dupixent 300 MG/2ML Sopn Generic drug: Dupilumab Inject 300 mg into the skin every 14 (fourteen) days.   Fluticasone-Salmeterol 250-50 MCG/DOSE Aepb Commonly known as: Wixela Inhub Inhale 1 puff into the lungs 2 (two) times daily. What changed: when to take this   letrozole 2.5 MG tablet Commonly known as: FEMARA Take 1 tablet (2.5 mg total) by mouth daily.   metoprolol succinate 25 MG 24 hr tablet Commonly known as: Toprol XL Take 0.5 tablets (12.5 mg total) by mouth daily.   multivitamin tablet Take 1 tablet by mouth daily.   pantoprazole 40 MG tablet Commonly known as: PROTONIX Take 1 tablet (40 mg total) by mouth daily.   predniSONE 10 MG (21) Tbpk tablet Commonly known as: STERAPRED UNI-PAK 21 TAB Take by mouth daily. What changed:  how much to take when to take this   PRESCRIPTION MEDICATION Inhale 1 ampule into the lungs daily as needed (SOB). Nebulizer solution   TUMS PO Take 0.5 tablets by mouth at bedtime as needed (stomach upset).   VITAMIN D PO Take 1 tablet by mouth daily.   ZADITOR OP Place 1 drop into both eyes at bedtime as needed (allergies).        Follow-up Information     Laura Koch, MD. Schedule an appointment as soon as possible for a visit in 1 week(s).   Specialty: Internal Medicine Contact information: [place] [address] [phone]         Laura Pain, MD. Schedule an appointment as soon as possible for a visit in 4 week(s).   Specialty: Cardiology Contact information: [address] [place] [address] [phone]                Allergies  Allergen Reactions   Clarithromycin  Anaphylaxis    "just about killed me"   Ibuprofen Hives   Aspirin Hives   Methotrexate Other (See Comments)     tongue swelling and mouth soreness   Codeine Nausea And Vomiting   Codeine Phosphate Nausea And Vomiting   Diltiazem Rash   Tape Itching and Other (See Comments)    Tears skin   Tapentadol Itching    Other reaction(s): Other (See Comments) Tears skin   Verapamil Hcl Er Rash    Consultations: Cardiology   Procedures/Studies: CARDIAC CATHETERIZATION  Result Date: 11/23/2021 1) Patent coronary arteries, right dominant, with mild irregularity but no significant stenoses. 2) Normal LVEDP Suspect demand ischemic event.  The patient does not have any high-grade CAD.  Recommend medical therapy.  Resume heparin tonight.  Likely transition to apixaban again tomorrow.   ECHOCARDIOGRAM COMPLETE  Result Date: 11/23/2021    ECHOCARDIOGRAM REPORT   Patient Name:   Laura Mendez Date of Exam: 11/23/2021 Medical Rec #:  [id number]              Height:       70.0 in Accession #:    [id number]             Weight:       195.0 lb Date of Birth:  1940-06-17               BSA:          2.065 m Patient Age:    28 years               BP:           119/62 mmHg Patient Gender: F                      HR:           87 bpm. Exam Location:  Inpatient Procedure: 2D Echo, Cardiac Doppler and Color Doppler Indications:    NSTEMI  History:        Patient has prior history of Echocardiogram examinations, most                 recent 06/29/2021. Arrythmias:Atrial Fibrillation;                 Signs/Symptoms:Chest Mendez.  Sonographer:    Joette Catching RCS Referring Phys: [phone] [place]  1. Left ventricular ejection fraction, by estimation, is 60 to 65%. The left ventricle has normal function. The left ventricle has no regional wall motion abnormalities. There is mild left ventricular hypertrophy. Left ventricular diastolic parameters are consistent with Grade I diastolic dysfunction (impaired  relaxation). Elevated left atrial pressure.  2. Right ventricular systolic function is normal. The right ventricular size is normal. There is mildly elevated pulmonary artery systolic pressure.  3. Left atrial size was severely dilated.  4. The mitral valve is normal in structure. Trivial mitral valve regurgitation. No evidence of mitral stenosis. Severe mitral annular calcification.  5. The aortic valve is tricuspid. Aortic valve regurgitation is not visualized. Aortic valve sclerosis is present, with no evidence of aortic valve stenosis.  6. The inferior vena cava is dilated in size with >50% respiratory variability, suggesting right atrial pressure of 8 mmHg. FINDINGS  Left Ventricle: Left ventricular ejection fraction, by estimation, is 60 to 65%. The left ventricle has normal function. The left ventricle has no regional wall motion abnormalities. The left ventricular internal cavity size was normal in size. There is  mild left ventricular hypertrophy. Left ventricular diastolic parameters are consistent with Grade I diastolic dysfunction (impaired relaxation). Elevated left atrial pressure. Right Ventricle: The right ventricular size is normal. Right ventricular systolic function is normal. There is mildly elevated pulmonary artery systolic pressure. The tricuspid regurgitant velocity is 3.03 m/s, and with an assumed right atrial pressure of 8 mmHg, the estimated right ventricular systolic pressure is 37.1 mmHg. Left Atrium: Left atrial size was severely dilated. Right Atrium: Right atrial size was normal in size. Pericardium: There is no evidence of pericardial effusion. Mitral Valve: The mitral valve is normal in structure. Severe mitral annular calcification. Trivial mitral valve regurgitation. No evidence of mitral valve stenosis. Tricuspid Valve: The tricuspid valve is normal in structure. Tricuspid valve regurgitation is  mild . No evidence of tricuspid stenosis. Aortic Valve: The aortic valve is  tricuspid. Aortic valve regurgitation is not visualized. Aortic valve sclerosis is present, with no evidence of aortic valve stenosis. Aortic valve mean gradient measures 5.0 mmHg. Aortic valve peak gradient measures 10.5 mmHg. Aortic valve area, by VTI measures 3.06 cm. Pulmonic Valve: The pulmonic valve was normal in structure. Pulmonic valve regurgitation is trivial. No evidence of pulmonic stenosis. Aorta: The aortic root is normal in size and structure. Venous: The inferior vena cava is dilated in size with greater than 50% respiratory variability, suggesting right atrial pressure of 8 mmHg. IAS/Shunts: No atrial level shunt detected by color flow Doppler.  LEFT VENTRICLE PLAX 2D LVIDd:         5.00 cm   Diastology LVIDs:         3.40 cm   LV e' medial:    5.98 cm/s LV PW:         1.20 cm   LV E/e' medial:  20.4 LV IVS:        1.10 cm   LV e' lateral:   7.51 cm/s LVOT diam:     2.30 cm   LV E/e' lateral: 16.2 LV SV:         100 LV SV Index:   48 LVOT Area:     4.15 cm  RIGHT VENTRICLE             IVC RV Basal diam:  4.10 cm     IVC diam: 2.90 cm RV Mid diam:    2.00 cm RV S prime:     17.00 cm/s TAPSE (M-mode): 2.0 cm LEFT ATRIUM              Index        RIGHT ATRIUM           Index LA diam:        5.60 cm  2.71 cm/m   RA Area:     17.20 cm LA Vol (A2C):   95.8 ml  46.39 ml/m  RA Volume:   45.00 ml  21.79 ml/m LA Vol (A4C):   114.0 ml 55.21 ml/m LA Biplane Vol: 106.0 ml 51.33 ml/m  AORTIC VALVE                     PULMONIC VALVE AV Area (Vmax):    3.46 cm      PV Vmax:       1.21 m/s AV Area (Vmean):   3.19 cm      PV Peak grad:  5.9 mmHg AV Area (VTI):     3.06 cm AV Vmax:           162.00 cm/s AV Vmean:          107.000 cm/s AV VTI:            0.327 m AV Peak Grad:      10.5 mmHg AV Mean Grad:      5.0 mmHg LVOT Vmax:         135.00 cm/s LVOT Vmean:        82.100 cm/s LVOT VTI:          0.241 m LVOT/AV VTI ratio: 0.74  AORTA Ao Root diam: 3.20 cm Ao Asc diam:  3.20 cm MITRAL VALVE                 TRICUSPID VALVE MV Area (PHT): 1.69 cm  TR Peak grad:   36.7 mmHg MV Decel Time: 448 msec     TR Vmax:        303.00 cm/s MR Peak grad: 80.3 mmHg MR Vmax:      448.00 cm/s   SHUNTS MV E velocity: 122.00 cm/s  Systemic VTI:  0.24 m MV A velocity: 169.00 cm/s  Systemic Diam: 2.30 cm MV E/A ratio:  0.72 Kirk Ruths MD Electronically signed by Kirk Ruths MD Signature Date/Time: 11/23/2021/12:14:44 PM    Final    DG Chest Portable 1 View  Result Date: 11/23/2021 CLINICAL DATA:  Chest Mendez and shortness of breath. EXAM: PORTABLE CHEST 1 VIEW COMPARISON:  Chest radiograph 09/01/2021 FINDINGS: Stable heart size and mediastinal contours. Mild chronic hyperinflation and bronchial thickening. There are ill-defined patchy bibasilar opacities not seen on prior exam. No pulmonary edema, pleural effusion, or pneumothorax. Grossly stable osseous structures. IMPRESSION: Ill-defined patchy bibasilar opacities, not seen on prior exam. This may represent atelectasis, pneumonia, or combination thereof. Electronically Signed   By: Keith Rake M.D.   On: 11/23/2021 01:02      Subjective: Patient seen and examined at the bedside this morning.  Hemodynamically stable.  Chest Mendez free.  Very eager to go home.  Medically stable for discharge  Discharge Exam: Vitals:   11/24/21 0559 11/24/21 0736  BP: 138/90   Pulse: 65   Resp:    Temp: 98 F (36.7 C)   SpO2: 96% 95%   Vitals:   11/24/21 0213 11/24/21 0500 11/24/21 0559 11/24/21 0736  BP: (!) 145/72  138/90   Pulse: (!) 41  65   Resp: 18     Temp: 98.1 F (36.7 C)  98 F (36.7 C)   TempSrc: Oral  Oral   SpO2: 95%  96% 95%  Weight:  90.4 kg    Height:        General: Pt is alert, awake, not in acute distress Cardiovascular: RRR, S1/S2 +, no rubs, no gallops Respiratory: CTA bilaterally, no wheezing, no rhonchi Abdominal: Soft, NT, ND, bowel sounds + Extremities: no edema, no cyanosis    The results of significant diagnostics from this  hospitalization (including imaging, microbiology, ancillary and laboratory) are listed below for reference.     Microbiology: No results found for this or any previous visit (from the past 240 hour(s)).   Labs: BNP (last 3 results) No results for input(s): "BNP" in the last 8760 hours. Basic Metabolic Panel: Recent Labs  Lab 11/23/21 0016 11/24/21 0203  NA 137 141  K 4.1 4.5  CL 102 114*  CO2 20* 19*  GLUCOSE 192* 93  BUN 25* 22  CREATININE 1.19* 0.73  CALCIUM 9.4 8.2*   Liver Function Tests: No results for input(s): "AST", "ALT", "ALKPHOS", "BILITOT", "PROT", "ALBUMIN" in the last 168 hours. No results for input(s): "LIPASE", "AMYLASE" in the last 168 hours. No results for input(s): "AMMONIA" in the last 168 hours. CBC: Recent Labs  Lab 11/23/21 0016 11/24/21 0203  WBC 14.7* 10.5  HGB 15.4* 13.4  HCT 45.6 42.3  MCV 92.1 94.2  PLT 401* 283   Cardiac Enzymes: No results for input(s): "CKTOTAL", "CKMB", "CKMBINDEX", "TROPONINI" in the last 168 hours. BNP: Invalid input(s): "POCBNP" CBG: Recent Labs  Lab 11/23/21 0627 11/23/21 2112  GLUCAP 174* 124*   D-Dimer No results for input(s): "DDIMER" in the last 72 hours. Hgb A1c Recent Labs    11/23/21 0600  HGBA1C 5.9*   Lipid Profile Recent Labs  11/24/21 0203  CHOL 129  HDL 42  LDLCALC 69  TRIG 90  CHOLHDL 3.1   Thyroid function studies No results for input(s): "TSH", "T4TOTAL", "T3FREE", "THYROIDAB" in the last 72 hours.  Invalid input(s): "FREET3" Anemia work up No results for input(s): "VITAMINB12", "FOLATE", "FERRITIN", "TIBC", "IRON", "RETICCTPCT" in the last 72 hours. Urinalysis    Component Value Date/Time   COLORURINE YELLOW 11/23/2021 0619   APPEARANCEUR CLEAR 11/23/2021 0619   LABSPEC 1.013 11/23/2021 0619   PHURINE 6.0 11/23/2021 0619   GLUCOSEU NEGATIVE 11/23/2021 0619   GLUCOSEU NEGATIVE 07/28/2020 1042   HGBUR NEGATIVE 11/23/2021 0619   HGBUR negative 09/18/2009 0846    BILIRUBINUR NEGATIVE 11/23/2021 0619   KETONESUR NEGATIVE 11/23/2021 0619   PROTEINUR NEGATIVE 11/23/2021 0619   UROBILINOGEN 0.2 07/28/2020 1042   NITRITE NEGATIVE 11/23/2021 0619   LEUKOCYTESUR NEGATIVE 11/23/2021 0619   Sepsis Labs Recent Labs  Lab 11/23/21 0016 11/24/21 0203  WBC 14.7* 10.5   Microbiology No results found for this or any previous visit (from the past 240 hour(s)).  Please note: You were cared for by a hospitalist during your hospital stay. Once you are discharged, your primary care physician will handle any further medical issues. Please note that NO REFILLS for any discharge medications will be authorized once you are discharged, as it is imperative that you return to your primary care physician (or establish a relationship with a primary care physician if you do not have one) for your post hospital discharge needs so that they can reassess your need for medications and monitor your lab values.    Time coordinating discharge: 40 minutes  SIGNED:   Shelly Coss, MD  [facility] 11/24/2021, 10:39 AM Pager [phone]  If 7PM-7AM, please contact night-coverage [email or web address] Password [email or web address]

## 2021-11-24 NOTE — Care Management CC44 (Signed)
Condition Code 44 Documentation Completed  Patient Details  Name: Laura Mendez MRN: [id number] Date of Birth: 09-Dec-1940   Condition Code 44 given:  Yes Patient signature on Condition Code 44 notice:  Yes Documentation of 2 MD's agreement:  Yes Code 44 added to claim:  Yes    Pollie Friar, RN 11/24/2021, 11:04 AM

## 2021-11-24 NOTE — Care Management Obs Status (Signed)
[facility] NOTIFICATION   Patient Details  Name: Laura Mendez MRN: [id number] Date of Birth: February 11, 1941   Medicare Observation Status Notification Given:  Yes    Pollie Friar, RN 11/24/2021, 11:03 AM

## 2021-11-25 ENCOUNTER — Encounter (HOSPITAL_COMMUNITY): Payer: Self-pay | Admitting: Cardiovascular Disease

## 2021-11-26 MED FILL — Verapamil HCl IV Soln 2.5 MG/ML: INTRAVENOUS | Qty: 2 | Status: AC

## 2021-11-28 DIAGNOSIS — M25511 Pain in right shoulder: Secondary | ICD-10-CM | POA: Diagnosis not present

## 2021-11-30 ENCOUNTER — Ambulatory Visit: Payer: Medicare Other | Admitting: Internal Medicine

## 2021-11-30 NOTE — Progress Notes (Deleted)
Cardiology Clinic Note   Patient Name: Laura Mendez Date of Encounter: 11/30/2021  Primary Care Provider:  Hoyt Koch, MD Primary Cardiologist:  Candee Furbish, MD  Patient Profile    Laura Mendez is a 81 y.o. female who presents to the clinic today for a hospital follow-up.   Past Medical History    Past Medical History:  Diagnosis Date   Asthma    Chronic airway obstruction, not elsewhere classified    Diastolic dysfunction without heart failure    Disorder of bone and cartilage, unspecified    Dysrhythmia    A-Fib   Family history of colon cancer    Family history of kidney cancer    Family history of ovarian cancer    Paroxysmal atrial flutter ([facility])    Stroke ([place])    TIA   TIA (transient ischemic attack)    Unspecified asthma(493.90)    Past Surgical History:  Procedure Laterality Date   ABDOMINAL HYSTERECTOMY     BREAST LUMPECTOMY Right 12/30/2017   BREAST LUMPECTOMY WITH RADIOACTIVE SEED LOCALIZATION Right 12/30/2017   Procedure: BREAST LUMPECTOMY WITH RADIOACTIVE SEED LOCALIZATION X'S 3;  Surgeon: Rolm Bookbinder, MD;  Location: [facility];  Service: General;  Laterality: Right;   BUNIONECTOMY Bilateral    EYE SURGERY Bilateral    cataract removal   LEFT HEART CATH AND CORONARY ANGIOGRAPHY N/A 11/23/2021   Procedure: LEFT HEART CATH AND CORONARY ANGIOGRAPHY;  Surgeon: Sherren Mocha, MD;  Location: [facility];  Service: Cardiovascular;  Laterality: N/A;    Allergies  Allergies  Allergen Reactions   Clarithromycin Anaphylaxis    "just about killed me"   Ibuprofen Hives   Aspirin Hives   Methotrexate Other (See Comments)     tongue swelling and mouth soreness   Codeine Nausea And Vomiting   Codeine Phosphate Nausea And Vomiting   Diltiazem Rash   Tape Itching and Other (See Comments)    Tears skin   Tapentadol Itching    Other reaction(s): Other (See Comments) Tears skin   Verapamil Hcl Er Rash    History of Present  Illness    Laura Mendez is a 81 y.o. female with a PMH of paroxysmal Afib, atrial flutter (converted to SR after receiveing 5 mg of Lopressor), chronic diastolic CHF, CVA, arteriosclerotic cerebrovascular disease, NSTEMI, ** RV abnormality,  asthma with COPD, breast cancer, hemotoma of left knee, CKD? And retinal vein occlusion.   Abnormal echo Elevated troponin  She was last seen in the cardiology office by Dr. Marlou Porch on 04/30/2020. She remained in SR with 65 bpm. She was tolerating Xarelto 20 mg daily. Kidney function was stable. Her chronic asthma was stable and she was beeing followed by different dermatolgists for a rash. *** Cardizem - did not tolerate d/t rash. She was switched from Xarelto to Eliquis after her embolic CVA in 07/7626.  She was admitted to the hospital on 11/22/2021 with a NSTEMI. She presented with a CC of chest pain. Described it as a burning sensation along the upper chest. EKG revealed A-fib with RVR with HR as high as 150s  in the ED. IV Lopressor brought HR down to 70s after administration. She was started on low-dose Metoprolol for rate-control. Trops were elevated and leukocytosis noted. A cardiac cath done on 11/23/2021 was normal and showed patent coronary arteries with normal LVEDP. It was thought that her elevated trops were due to demand ischemic event. An echo done showed EF of 60-65%  and grade 1 diastolic dysfunction.     Home Medications    Current Outpatient Medications  Medication Sig Dispense Refill   acetaminophen (TYLENOL) 325 MG tablet Take 2 tablets (650 mg total) by mouth every 4 (four) hours as needed for mild pain (or temp > 37.5 C (99.5 F)). (Patient taking differently: Take 650 mg by mouth daily as needed for headache.)     albuterol (VENTOLIN HFA) 108 (90 Base) MCG/ACT inhaler INHALE 2 PUFFS BY MOUTH EVERY 4 HOURS AS NEEDED (Patient taking differently: Inhale 2 puffs into the lungs as needed (asthma).) 18 g 12   apixaban (ELIQUIS) 5 MG TABS tablet  Take 1 tablet (5 mg total) by mouth 2 (two) times daily. 180 tablet 0   atorvastatin (LIPITOR) 20 MG tablet Take 1 tablet by mouth daily.     Calcium Carbonate Antacid (TUMS PO) Take 0.5 tablets by mouth at bedtime as needed (stomach upset).     DUPIXENT 300 MG/2ML SOPN Inject 300 mg into the skin every 14 (fourteen) days.     Fluticasone-Salmeterol (WIXELA INHUB) 250-50 MCG/DOSE AEPB Inhale 1 puff into the lungs 2 (two) times daily. (Patient taking differently: Inhale 1 puff into the lungs daily.) 60 each 10   Ketotifen Fumarate (ZADITOR OP) Place 1 drop into both eyes at bedtime as needed (allergies).     letrozole (FEMARA) 2.5 MG tablet Take 1 tablet (2.5 mg total) by mouth daily. 90 tablet 0   metoprolol succinate (TOPROL XL) 25 MG 24 hr tablet Take 0.5 tablets (12.5 mg total) by mouth daily. 30 tablet 0   Multiple Vitamin (MULTIVITAMIN) tablet Take 1 tablet by mouth daily.     pantoprazole (PROTONIX) 40 MG tablet Take 1 tablet (40 mg total) by mouth daily. 90 tablet 3   predniSONE (STERAPRED UNI-PAK 21 TAB) 10 MG (21) TBPK tablet Take by mouth daily. (Patient taking differently: Take 20 mg by mouth in the morning, at noon, and at bedtime.) 21 tablet 0   PRESCRIPTION MEDICATION Inhale 1 ampule into the lungs daily as needed (SOB). Nebulizer solution     VITAMIN D PO Take 1 tablet by mouth daily.     Current Facility-Administered Medications  Medication Dose Route Frequency Provider Last Rate Last Admin   Mepolizumab SOLR 100 mg  100 mg Subcutaneous Q28 days Young, Clinton D, MD   100 mg at 12/20/17 1455     Family History    Family History  Problem Relation Age of Onset   COPD Father    Colon cancer Mother 7       mets to pancreas and liver   Kidney cancer Sister 73       d. 12   Lung cancer Sister        d. 59, smoker   Ovarian cancer Sister 25       d. 67   Cirrhosis Sister        d. 89   Melanoma Sister 24   Ovarian cancer Maternal Grandmother        d. 65   Heart disease  Maternal Grandfather        rheumatic heart disease   Rectal cancer Paternal Grandfather        d. 30   Parkinson's disease Brother    Ovarian cancer Maternal Aunt    Colon cancer Maternal Uncle        d. 72-73   Heart attack Paternal Uncle        d. 54  Kidney cancer Brother 28   Colon cancer Maternal Uncle        d. 71   Cancer Other        MGMs pat 1/2 sister with uterine and rectal cancer   Colon cancer Cousin        d. 79 - mat first cousin   Breast cancer Neg Hx    She indicated that her mother is deceased. She indicated that her father is deceased. She indicated that only one of her five sisters is alive. She indicated that two of her three brothers are alive. She indicated that her maternal grandmother is deceased. She indicated that her maternal grandfather is deceased. She indicated that her paternal grandmother is deceased. She indicated that her paternal grandfather is deceased. She indicated that both of her maternal aunts are deceased. She indicated that both of her maternal uncles are deceased. She indicated that her paternal aunt is deceased. She indicated that her paternal uncle is deceased. She indicated that her cousin is deceased. She indicated that the status of her neg hx is unknown. She indicated that her other is deceased.  Social History    Social History   Socioeconomic History   Marital status: Divorced    Spouse name: Not on file   Number of children: Not on file   Years of education: Not on file   Highest education level: Not on file  Occupational History   Occupation: retired  Tobacco Use   Smoking status: Former    Years: 0.00    Types: Cigarettes    Quit date: 05/24/1977    Years since quitting: 44.5   Smokeless tobacco: Never   Tobacco comments:    7/7 pt states she occasionally smoked, let cigs "burn" more than she smoked them  Vaping Use   Vaping Use: Never used  Substance and Sexual Activity   Alcohol use: No   Drug use: No   Sexual  activity: Not on file  Other Topics Concern   Not on file  Social History Narrative   Right handed   1 coke per day   Lives alone   Social Determinants of Health   Financial Resource Strain: Low Risk  (11/16/2021)   Overall Financial Resource Strain (CARDIA)    Difficulty of Paying Living Expenses: Not hard at all  Food Insecurity: No Food Insecurity (11/16/2021)   Hunger Vital Sign    Worried About Running Out of Food in the Last Year: Never true    Ran Out of Food in the Last Year: Never true  Transportation Needs: No Transportation Needs (11/16/2021)   PRAPARE - Hydrologist (Medical): No    Lack of Transportation (Non-Medical): No  Physical Activity: Insufficiently Active (11/16/2021)   Exercise Vital Sign    Days of Exercise per Week: 2 days    Minutes of Exercise per Session: 30 min  Stress: No Stress Concern Present (11/16/2021)   [place]    Feeling of Stress : Not at all  Social Connections: Moderately Isolated (11/16/2021)   Social Connection and Isolation Panel [NHANES]    Frequency of Communication with Friends and Family: Three times a week    Frequency of Social Gatherings with Friends and Family: Three times a week    Attends Religious Services: More than 4 times per year    Active Member of Clubs or Organizations: No    Attends Archivist Meetings:  Never    Marital Status: Divorced  Human resources officer Violence: Not At Risk (11/16/2021)   Humiliation, Afraid, Rape, and Kick questionnaire    Fear of Current or Ex-Partner: No    Emotionally Abused: No    Physically Abused: No    Sexually Abused: No     Review of Systems    General:  No chills, fever, night sweats or weight changes.  Cardiovascular:  No chest pain, dyspnea on exertion, edema, orthopnea, palpitations, paroxysmal nocturnal dyspnea. Dermatological: No rash, lesions/masses Respiratory: No cough,  dyspnea Urologic: No hematuria, dysuria Abdominal:   No nausea, vomiting, diarrhea, bright red blood per rectum, melena, or hematemesis Neurologic:  No visual changes, wkns, changes in mental status. All other systems reviewed and are otherwise negative except as noted above.     Physical Exam    VS:  There were no vitals taken for this visit. , BMI There is no height or weight on file to calculate BMI.     GEN: Well nourished, well developed, in no acute distress. HEENT: normal. Neck: Supple, no JVD, carotid bruits, or masses. Cardiac: RRR, no murmurs, rubs, or gallops. No clubbing, cyanosis, edema.  Radials/DP/PT 2+ and equal bilaterally.  Respiratory:  Respirations regular and unlabored, clear to auscultation bilaterally. GI: Soft, nontender, nondistended, BS + x 4. MS: no deformity or atrophy. Skin: warm and dry, no rash. Neuro:  Strength and sensation are intact. Psych: Normal affect.  Accessory Clinical Findings    ECG personally reviewed by me today- *** - No acute changes  Lab Results  Component Value Date   WBC 10.5 11/24/2021   HGB 13.4 11/24/2021   HCT 42.3 11/24/2021   MCV 94.2 11/24/2021   PLT 283 11/24/2021   Lab Results  Component Value Date   CREATININE 0.73 11/24/2021   BUN 22 11/24/2021   NA 141 11/24/2021   K 4.5 11/24/2021   CL 114 (H) 11/24/2021   CO2 19 (L) 11/24/2021   Lab Results  Component Value Date   ALT 15 10/21/2021   AST 20 10/21/2021   ALKPHOS 78 10/21/2021   BILITOT 0.6 10/21/2021   Lab Results  Component Value Date   CHOL 129 11/24/2021   HDL 42 11/24/2021   LDLCALC 69 11/24/2021   LDLDIRECT 108.0 04/09/2020   TRIG 90 11/24/2021   CHOLHDL 3.1 11/24/2021    Lab Results  Component Value Date   HGBA1C 5.9 (H) 11/23/2021    Assessment & Plan   1.  ***  Finis Bud, NP 11/30/2021, 9:32 PM

## 2021-12-01 ENCOUNTER — Ambulatory Visit: Payer: Medicare Other | Admitting: Physician Assistant

## 2021-12-01 NOTE — Progress Notes (Unsigned)
Cardiology Clinic Note   Patient Name: Laura Mendez Date of Encounter: 12/02/21  Primary Care Provider:  Hoyt Koch, MD Primary Cardiologist:  Candee Furbish, MD  Patient Profile    Laura Mendez is a 81 y.o. female who presents to the clinic today after her recent hospital stay for chest pain.   Past Medical History    Past Medical History:  Diagnosis Date   Asthma    Chronic airway obstruction, not elsewhere classified    Diastolic dysfunction without heart failure    Disorder of bone and cartilage, unspecified    Dysrhythmia    A-Fib   Family history of colon cancer    Family history of kidney cancer    Family history of ovarian cancer    Paroxysmal atrial flutter ([place])    Stroke ([place])    TIA   TIA (transient ischemic attack)    Unspecified asthma(493.90)    Past Surgical History:  Procedure Laterality Date   ABDOMINAL HYSTERECTOMY     BREAST LUMPECTOMY Right 12/30/2017   BREAST LUMPECTOMY WITH RADIOACTIVE SEED LOCALIZATION Right 12/30/2017   Procedure: BREAST LUMPECTOMY WITH RADIOACTIVE SEED LOCALIZATION X'S 3;  Surgeon: Rolm Bookbinder, MD;  Location: [place];  Service: General;  Laterality: Right;   BUNIONECTOMY Bilateral    EYE SURGERY Bilateral    cataract removal   LEFT HEART CATH AND CORONARY ANGIOGRAPHY N/A 11/23/2021   Procedure: LEFT HEART CATH AND CORONARY ANGIOGRAPHY;  Surgeon: Sherren Mocha, MD;  Location: [facility];  Service: Cardiovascular;  Laterality: N/A;    Allergies  Allergies  Allergen Reactions   Clarithromycin Anaphylaxis    "just about killed me"   Ibuprofen Hives   Aspirin Hives   Methotrexate Other (See Comments)     tongue swelling and mouth soreness   Codeine Nausea And Vomiting   Codeine Phosphate Nausea And Vomiting   Diltiazem Rash   Tape Itching and Other (See Comments)    Tears skin   Tapentadol Itching    Other reaction(s): Other (See Comments) Tears skin   Verapamil Hcl Er Rash     History of Present Illness    Laura Mendez is a very pleasant 81 y.o. female with a PMH of paroxysmal A-fib/flutter on Eliquis, asthma, right breast cancer status post lumpectomy on letrozole, past stroke, CKD stage 2 here for follow up after her recent hospital discharge for chest pain.  She had remote atrial flutter with spontaneous conversion to NSR. She had prior rash with diltiazem. She had a stroke in 06/2021 with imaging suggesting potential embolic phenomenon despite adherence to Xarelto so was changed to Eliquis.  More recently, she presented to the hospital on July 2nd, 2023 with chest pain she described her chest pain as a burning sensation of the upper chest. 12 lead EKG showed A-fib with RVR and Leukocytosis of 14.7. Her troponins revealed a trend of: 50, 1,138, and 4,746, and 6,542. She was diagnosed as a NSTEMI and cardiology was consulted. A cardiac cath was done 11/23/2021 which revealed patent coronary arteries, right dominant, mild regularity but no significant stenosis with normal LVEDP. It was suspected that the elevated troponin was due to demand ischemia. She was given 5 milligrams of IV Lopressor for her Afib RVR with a heart rate in the 150s, and after the Lopressor was given her heart rate went back down to the 70s. She was started on low dose metoprolol for rate control and eliquis was continued. She  was discharged on November 24, 2021.  Today she presents to the clinic for hospital follow-up. She states she is doing well after her recent NSTEMI on 11/2021. No chest pain, shortness of breath, palpitations, swelling, PND, bleeding, syncope/presyncope, dizziness. She participates in physical therapy once a week and stays active around the house. She tries to avoid outdoors activities this summer as she has said the summer heat makes it harder for her to breathe. She follows Dr. Annamaria Boots for her asthma, no recent acute exacerbations. She is following a heart healthy diet. Denies any  issues with her blood pressure and does not check it at home even though she says she has an arm cuff. She denies any other cardiac complaints today.   Home Medications    Current Outpatient Medications  Medication Sig Dispense Refill   acetaminophen (TYLENOL) 325 MG tablet Take 2 tablets (650 mg total) by mouth every 4 (four) hours as needed for mild pain (or temp > 37.5 C (99.5 F)). (Patient taking differently: Take 650 mg by mouth daily as needed for headache.)     albuterol (VENTOLIN HFA) 108 (90 Base) MCG/ACT inhaler INHALE 2 PUFFS BY MOUTH EVERY 4 HOURS AS NEEDED 18 g 12   apixaban (ELIQUIS) 5 MG TABS tablet Take 1 tablet (5 mg total) by mouth 2 (two) times daily. 180 tablet 0   Calcium Carbonate Antacid (TUMS PO) Take 0.5 tablets by mouth at bedtime as needed (stomach upset).     DUPIXENT 300 MG/2ML SOPN Inject 300 mg into the skin every 14 (fourteen) days.     Fluticasone-Salmeterol (WIXELA INHUB) 250-50 MCG/DOSE AEPB Inhale 1 puff into the lungs 2 (two) times daily. (Patient taking differently: Inhale 1 puff into the lungs daily.) 60 each 10   Ketotifen Fumarate (ZADITOR OP) Place 1 drop into both eyes at bedtime as needed (allergies).     letrozole (FEMARA) 2.5 MG tablet Take 1 tablet (2.5 mg total) by mouth daily. 90 tablet 0   metoprolol succinate (TOPROL XL) 25 MG 24 hr tablet Take 0.5 tablets (12.5 mg total) by mouth daily. 30 tablet 0   Multiple Vitamin (MULTIVITAMIN) tablet Take 1 tablet by mouth daily.     pantoprazole (PROTONIX) 40 MG tablet Take 1 tablet (40 mg total) by mouth daily. 90 tablet 3   predniSONE (STERAPRED UNI-PAK 21 TAB) 10 MG (21) TBPK tablet Take by mouth daily. (Patient taking differently: Take 20 mg by mouth in the morning, at noon, and at bedtime.) 21 tablet 0   PRESCRIPTION MEDICATION Inhale 1 ampule into the lungs daily as needed (SOB). Nebulizer solution     VITAMIN D PO Take 1 tablet by mouth daily.     Current Facility-Administered Medications   Medication Dose Route Frequency Provider Last Rate Last Admin   Mepolizumab SOLR 100 mg  100 mg Subcutaneous Q28 days Annamaria Boots, Clinton D, MD   100 mg at 12/20/17 1455     Family History    Family History  Problem Relation Age of Onset   COPD Father    Colon cancer Mother 24       mets to pancreas and liver   Kidney cancer Sister 47       d. 46   Lung cancer Sister        d. 69, smoker   Ovarian cancer Sister 61       d. 68   Cirrhosis Sister        d. 37   Melanoma Sister 108  Ovarian cancer Maternal Grandmother        d. 32   Heart disease Maternal Grandfather        rheumatic heart disease   Rectal cancer Paternal Grandfather        d. 68   Parkinson's disease Brother    Ovarian cancer Maternal Aunt    Colon cancer Maternal Uncle        d. 72-73   Heart attack Paternal Uncle        d. 26   Kidney cancer Brother 12   Colon cancer Maternal Uncle        d. 32   Cancer Other        MGMs pat 1/2 sister with uterine and rectal cancer   Colon cancer Cousin        d. 48 - mat first cousin   Breast cancer Neg Hx    She indicated that her mother is deceased. She indicated that her father is deceased. She indicated that only one of her five sisters is alive. She indicated that two of her three brothers are alive. She indicated that her maternal grandmother is deceased. She indicated that her maternal grandfather is deceased. She indicated that her paternal grandmother is deceased. She indicated that her paternal grandfather is deceased. She indicated that both of her maternal aunts are deceased. She indicated that both of her maternal uncles are deceased. She indicated that her paternal aunt is deceased. She indicated that her paternal uncle is deceased. She indicated that her cousin is deceased. She indicated that the status of her neg hx is unknown. She indicated that her other is deceased.  Social History    Social History   Socioeconomic History   Marital status: Divorced     Spouse name: Not on file   Number of children: Not on file   Years of education: Not on file   Highest education level: Not on file  Occupational History   Occupation: retired  Tobacco Use   Smoking status: Former    Years: 0.00    Types: Cigarettes    Quit date: 05/24/1977    Years since quitting: 44.5   Smokeless tobacco: Never   Tobacco comments:    7/7 pt states she occasionally smoked, let cigs "burn" more than she smoked them  Vaping Use   Vaping Use: Never used  Substance and Sexual Activity   Alcohol use: No   Drug use: No   Sexual activity: Not on file  Other Topics Concern   Not on file  Social History Narrative   Right handed   1 coke per day   Lives alone   Social Determinants of Health   Financial Resource Strain: Low Risk  (11/16/2021)   Overall Financial Resource Strain (CARDIA)    Difficulty of Paying Living Expenses: Not hard at all  Food Insecurity: No Food Insecurity (11/16/2021)   Hunger Vital Sign    Worried About Running Out of Food in the Last Year: Never true    Ran Out of Food in the Last Year: Never true  Transportation Needs: No Transportation Needs (11/16/2021)   PRAPARE - Hydrologist (Medical): No    Lack of Transportation (Non-Medical): No  Physical Activity: Insufficiently Active (11/16/2021)   Exercise Vital Sign    Days of Exercise per Week: 2 days    Minutes of Exercise per Session: 30 min  Stress: No Stress Concern Present (11/16/2021)   [employer] of  Occupational Health - Occupational Stress Questionnaire    Feeling of Stress : Not at all  Social Connections: Moderately Isolated (11/16/2021)   Social Connection and Isolation Panel [NHANES]    Frequency of Communication with Friends and Family: Three times a week    Frequency of Social Gatherings with Friends and Family: Three times a week    Attends Religious Services: More than 4 times per year    Active Member of Clubs or Organizations: No     Attends Archivist Meetings: Never    Marital Status: Divorced  Human resources officer Violence: Not At Risk (11/16/2021)   Humiliation, Afraid, Rape, and Kick questionnaire    Fear of Current or Ex-Partner: No    Emotionally Abused: No    Physically Abused: No    Sexually Abused: No     Review of Systems   Review of Systems  Constitutional: Negative.   HENT: Negative.    Eyes: Negative.   Respiratory:  Negative for cough, hemoptysis, sputum production, shortness of breath and wheezing.        Occasional shortness of breathe when outside in summer heat per her report  Cardiovascular: Negative.  Negative for chest pain, palpitations, orthopnea, claudication, leg swelling and PND.  Gastrointestinal: Negative.   Genitourinary: Negative.   Musculoskeletal: Negative.   Skin: Negative.   Neurological: Negative.   Endo/Heme/Allergies: Negative.   Psychiatric/Behavioral: Negative.     All other systems reviewed and are otherwise negative except as noted above.     Physical Exam   VS:  BP 104/64   Pulse 89   Ht '5\' 10"'$  (1.778 m)   Wt 198 lb 12.8 oz (90.2 kg)   SpO2 95%   BMI 28.52 kg/m  , BMI Body mass index is 28.52 kg/m.     Blood pressure rechecked on exam and was found to be 112/70.  GEN: Well nourished, well developed, in no acute distress. HEENT: normal. Neck: Supple, no JVD, carotid bruits, or masses. Cardiac: Regular rate with extra beats heard during ascultation, no murmurs, rubs, or gallops. No clubbing, cyanosis, edema.  Radials and PT 2+ and equal bilaterally.  Respiratory:  Respirations regular and unlabored, clear to auscultation bilaterally. GI: Soft, nontender, nondistended, BS + x 4. MS: no deformity or atrophy, slow gait, walks with straight cane Skin: warm and dry, no rash, right radial cath does not appear infected and contains a good pulse. One bruise noted on right arm more proximally up the forearm, otherwise no acute findings Neuro:  Strength and  sensation are intact. Psych: Normal affect.  Accessory Clinical Findings    ECG personally reviewed by me today- 12 lead ECG reading today revealed sinus arrhythmia with frequent PAC's and occasional PVC's. Nonspecific ST and T wave abnormality, however last EKG on 11/24/21 showed similar findings. No acute changes  Cardiac cath on 11/23/21 revealed: 1) Patent coronary arteries, right dominant, with mild irregularity but no significant stenoses. 2) Normal LVEDP Suspect demand ischemic event. The patient does not have any high-grade CAD. Recommend medical therapy. Resume heparin tonight. Likely transition to apixaban again tomorrow.   2D Echo on 11/23/21 revealed: 1. Left ventricular ejection fraction, by estimation, is 60 to 65%. The left ventricle has normal function. The left ventricle has no regional wall motion abnormalities. There is mild left ventricular hypertrophy.  Left ventricular diastolic parameters are consistent with Grade I diastolic dysfunction (impaired relaxation).  Elevated left atrial pressure.   2. Right ventricular systolic function is normal. The right  ventricular size is normal. There is mildly elevated pulmonary artery systolic  pressure.   3. Left atrial size was severely dilated.   4. The mitral valve is normal in structure. Trivial mitral valve regurgitation. No evidence of mitral stenosis. Severe mitral annular  calcification.   5. The aortic valve is tricuspid. Aortic valve regurgitation is not visualized. Aortic valve sclerosis is present, with no evidence of aortic valve stenosis.   6. The inferior vena cava is dilated in size with >50% respiratory variability, suggesting right atrial pressure of 8 mmHg.   Cardiac event monitor on 08/19/2015 revealed: Sinus rhythm with frequent premature atrial contractions (PACs). No evidence of atrial fibrillation, ventricular tachycardia or supra-ventricular tachycardia. There is artifact present on many of the tracings.      Lab Results  Component Value Date   WBC 10.5 11/24/2021   HGB 13.4 11/24/2021   HCT 42.3 11/24/2021   MCV 94.2 11/24/2021   PLT 283 11/24/2021   Lab Results  Component Value Date   CREATININE 0.73 11/24/2021   BUN 22 11/24/2021   NA 141 11/24/2021   K 4.5 11/24/2021   CL 114 (H) 11/24/2021   CO2 19 (L) 11/24/2021   Lab Results  Component Value Date   ALT 15 10/21/2021   AST 20 10/21/2021   ALKPHOS 78 10/21/2021   BILITOT 0.6 10/21/2021   Lab Results  Component Value Date   CHOL 129 11/24/2021   HDL 42 11/24/2021   LDLCALC 69 11/24/2021   LDLDIRECT 108.0 04/09/2020   TRIG 90 11/24/2021   CHOLHDL 3.1 11/24/2021    Lab Results  Component Value Date   HGBA1C 5.9 (H) 11/23/2021    Assessment & Plan   1. Recent demand ischemia s/p cardiac catheterization She is doing well from a cardiac perspective and does not have any acute complaints during this visit. Recent cath on 11/23/21 revealed patent coronary arteries, right dominant, with mild irregularity but no significant stenoses and normal LVEDP. She does not have any high grade coronary artery disease. I educated the patient about her condition and she verbalized understanding. Continue Eliquis 5 mg PO BID. Encouraged the patient to follow up regularly with her PCP and Dr. Annamaria Boots. Encouraged her to check her blood pressures daily and to maintain a heart healthy diet and gave her paperwork regarding this.   2. Paroxysmal A-fib,  3. Premature atrial contractions 4. Chronic anticoagulation Pt is no longer in A-fib but 12 lead EKG today shows frequent PAC's and patient is only on Metoprolol 12.5 mg PO daily. I explained to patient about the potential risk of going back into Atrial fibrillation and about consideration of an ablation. I educated her that I believe it would be best to refer her to EP to discuss about an ablation and she is agreeable to that. States she wants her sons to be there to discuss this with them. She  currently has no symptoms to suggest OSA.  5. Chronic diastolic CHF (HFpEF) Appears euvolemic on exam without any signs of fluid overload. Low sodium diet, fluid restriction <2L, and daily weights encouraged. Educated to contact our office for weight gain of 2 lbs overnight or 5 lbs in one week. Continue Metoprolol 12.5 mg PO daily.   6. Disposition: She is doing very well from a cardiac perspective. No medication changes or labs ordered for today. We will refer her to EP to discuss a possible ablation and she states she wants her sons to be a part of this  medical decision. No elevated blood pressure readings, but I discussed to monitor BP at home at least 2 hours after medications and sitting for 5-10 minutes with her arm cuff and to let us know if she has any low blood pressure readings or develops presyncope, lightheadedness, or dizziness or for any new or worsening symptoms. F/U in 6 months in the office or sooner if needed.   Finis Bud, NP 12/02/2021, 2:23 PM

## 2021-12-02 ENCOUNTER — Ambulatory Visit: Payer: Medicare Other

## 2021-12-02 ENCOUNTER — Encounter: Payer: Self-pay | Admitting: Nurse Practitioner

## 2021-12-02 ENCOUNTER — Ambulatory Visit: Payer: Medicare Other | Admitting: Nurse Practitioner

## 2021-12-02 VITALS — BP 104/64 | HR 89 | Ht 70.0 in | Wt 198.8 lb

## 2021-12-02 DIAGNOSIS — I5032 Chronic diastolic (congestive) heart failure: Secondary | ICD-10-CM

## 2021-12-02 DIAGNOSIS — I480 Paroxysmal atrial fibrillation: Secondary | ICD-10-CM

## 2021-12-02 DIAGNOSIS — I214 Non-ST elevation (NSTEMI) myocardial infarction: Secondary | ICD-10-CM | POA: Diagnosis not present

## 2021-12-02 DIAGNOSIS — Z7901 Long term (current) use of anticoagulants: Secondary | ICD-10-CM | POA: Diagnosis not present

## 2021-12-02 DIAGNOSIS — I491 Atrial premature depolarization: Secondary | ICD-10-CM | POA: Diagnosis not present

## 2021-12-02 DIAGNOSIS — Z9889 Other specified postprocedural states: Secondary | ICD-10-CM

## 2021-12-02 NOTE — Patient Instructions (Signed)
Medication Instructions:  Your physician recommends that you continue on your current medications as directed. Please refer to the Current Medication list given to you today. *If you need a refill on your cardiac medications before your next appointment, please call your pharmacy*   Lab Work: None Ordered   Testing/Procedures: None Ordered   Follow-Up: At [facility], you and your health needs are our priority.  As part of our continuing mission to provide you with exceptional heart care, we have created designated Provider Care Teams.  These Care Teams include your primary Cardiologist (physician) and Advanced Practice Providers (APPs -  Physician Assistants and Nurse Practitioners) who all work together to provide you with the care you need, when you need it.  We recommend signing up for the patient portal called "MyChart".  Sign up information is provided on this After Visit Summary.  MyChart is used to connect with patients for Virtual Visits (Telemedicine).  Patients are able to view lab/test results, encounter notes, upcoming appointments, etc.  Non-urgent messages can be sent to your provider as well.   To learn more about what you can do with MyChart, go to [email or web address].    Your next appointment:   6 month(s)  The format for your next appointment:   In Person  Provider:   Candee Furbish, MD     Other Instructions You have been referred to EP Dept. [facility] 6 handout given  Important Information About Sugar

## 2021-12-03 ENCOUNTER — Encounter: Payer: Self-pay | Admitting: Cardiology

## 2021-12-03 DIAGNOSIS — I214 Non-ST elevation (NSTEMI) myocardial infarction: Secondary | ICD-10-CM

## 2021-12-03 DIAGNOSIS — R2689 Other abnormalities of gait and mobility: Secondary | ICD-10-CM

## 2021-12-04 NOTE — Telephone Encounter (Signed)
Okay to refer to cardiac rehab if patient prefers this; however if she would like to go to her regular PT we can place a referral for this also.

## 2021-12-08 ENCOUNTER — Encounter: Payer: Self-pay | Admitting: Physical Medicine & Rehabilitation

## 2021-12-08 ENCOUNTER — Ambulatory Visit: Payer: Medicare Other

## 2021-12-08 ENCOUNTER — Encounter: Payer: Medicare Other | Attending: Registered Nurse | Admitting: Physical Medicine & Rehabilitation

## 2021-12-08 VITALS — BP 111/72 | HR 76 | Ht 70.0 in | Wt 191.0 lb

## 2021-12-08 DIAGNOSIS — R269 Unspecified abnormalities of gait and mobility: Secondary | ICD-10-CM | POA: Diagnosis not present

## 2021-12-08 DIAGNOSIS — I69398 Other sequelae of cerebral infarction: Secondary | ICD-10-CM | POA: Diagnosis not present

## 2021-12-08 NOTE — Progress Notes (Signed)
Subjective:    Patient ID: Laura Mendez, female    DOB: [birth year], 81 y.o.   MRN: [id number] 81 y.o. right-handed female with history of TIA, atrial fibrillation maintained on Xarelto followed by Dr. Marlou Porch, COPD followed by Dr. Annamaria Boots, diastolic congestive heart failure, remote right breast cancer with lumpectomy radioactive seed localization 2019 followed by Dr.Gudena, quit smoking 44 years ago.  Per chart review lives alone independent prior to admission.  Presented 06/28/2021 after a fall while walking from the foot of her bed striking her left knee with noted left-sided weakness and slurred speech.  MRI showed acute infarction within bilateral cerebral hemispheres right basal ganglia posterior limb of right internal capsule left thalamus and within the bilateral cerebellar hemisphere.  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  MRA of the head showed no large vessel occlusion or high-grade stenosis.  MRA of the neck without hemodynamically significant stenosis.  CT of the left knee shows small joint effusion as well as tricompartmental degenerative osteoarthritis changes mostly pronounced at the lateral compartment.  Admission chemistries unremarkable urine drug screen negative.  Echocardiogram with ejection fraction of 60 to 65% no wall motion abnormalities grade 1 diastolic dysfunction.  Neurology follow-up transition from Xarelto to Eliquis.  Therapy evaluations completed due to patient decreased functional mobility was admitted for a comprehensive rehab program.     Admit date: 07/01/2021 Discharge date: 07/15/2021 HPI  The patient is starting another round of therapy.  She is back to driving she is walking outside the home.  She does use a cane.  She is independent with her self-care Most recent falls. Interval medical history overnight hospitalization for chest pain cardiac cath was reportedly negative  A-fib with rapid ventricular response and started on metoprolol  Pain  Inventory Average Pain 0 Pain Right Now 0 My pain is  no pain  LOCATION OF PAIN  no pain  BOWEL Number of stools per week: 7   BLADDER Normal    Mobility use a cane how many minutes can you walk? No problem ability to climb steps?  yes do you drive?  yes Do you have any goals in this area?  yes  Function retired Do you have any goals in this area?  yes  Neuro/Psych No problems in this area  Prior Studies Any changes since last visit?  yes .in hospital on July 3-4 at [facility]  Physicians involved in your care Any changes since last visit?  no   Family History  Problem Relation Age of Onset   COPD Father    Colon cancer Mother 70       mets to pancreas and liver   Kidney cancer Sister 47       d. 68   Lung cancer Sister        d. 44, smoker   Ovarian cancer Sister 25       d. 89   Cirrhosis Sister        d. 77   Melanoma Sister 3   Ovarian cancer Maternal Grandmother        d. 78   Heart disease Maternal Grandfather        rheumatic heart disease   Rectal cancer Paternal Grandfather        d. 87   Parkinson's disease Brother    Ovarian cancer Maternal Aunt    Colon cancer Maternal Uncle        d. 72-73   Heart attack Paternal Uncle  d. 98   Kidney cancer Brother 67   Colon cancer Maternal Uncle        d. 76   Cancer Other        MGMs pat 1/2 sister with uterine and rectal cancer   Colon cancer Cousin        d. 63 - mat first cousin   Breast cancer Neg Hx    Social History   Socioeconomic History   Marital status: Divorced    Spouse name: Not on file   Number of children: Not on file   Years of education: Not on file   Highest education level: Not on file  Occupational History   Occupation: retired  Tobacco Use   Smoking status: Former    Years: 0.00    Types: Cigarettes    Quit date: 05/24/1977    Years since quitting: 44.5   Smokeless tobacco: Never   Tobacco comments:    7/7 pt states she occasionally smoked, let cigs "burn"  more than she smoked them  Vaping Use   Vaping Use: Never used  Substance and Sexual Activity   Alcohol use: No   Drug use: No   Sexual activity: Not on file  Other Topics Concern   Not on file  Social History Narrative   Right handed   1 coke per day   Lives alone   Social Determinants of Health   Financial Resource Strain: Low Risk  (11/16/2021)   Overall Financial Resource Strain (CARDIA)    Difficulty of Paying Living Expenses: Not hard at all  Food Insecurity: No Food Insecurity (11/16/2021)   Hunger Vital Sign    Worried About Running Out of Food in the Last Year: Never true    [place] in the Last Year: Never true  Transportation Needs: No Transportation Needs (11/16/2021)   PRAPARE - Hydrologist (Medical): No    Lack of Transportation (Non-Medical): No  Physical Activity: Insufficiently Active (11/16/2021)   Exercise Vital Sign    Days of Exercise per Week: 2 days    Minutes of Exercise per Session: 30 min  Stress: No Stress Concern Present (11/16/2021)   [place]    Feeling of Stress : Not at all  Social Connections: Moderately Isolated (11/16/2021)   Social Connection and Isolation Panel [NHANES]    Frequency of Communication with Friends and Family: Three times a week    Frequency of Social Gatherings with Friends and Family: Three times a week    Attends Religious Services: More than 4 times per year    Active Member of Clubs or Organizations: No    Attends Archivist Meetings: Never    Marital Status: Divorced   Past Surgical History:  Procedure Laterality Date   ABDOMINAL HYSTERECTOMY     BREAST LUMPECTOMY Right 12/30/2017   BREAST LUMPECTOMY WITH RADIOACTIVE SEED LOCALIZATION Right 12/30/2017   Procedure: BREAST LUMPECTOMY WITH RADIOACTIVE SEED LOCALIZATION X'S 3;  Surgeon: Rolm Bookbinder, MD;  Location: [facility];  Service: General;  Laterality:  Right;   BUNIONECTOMY Bilateral    EYE SURGERY Bilateral    cataract removal   LEFT HEART CATH AND CORONARY ANGIOGRAPHY N/A 11/23/2021   Procedure: LEFT HEART CATH AND CORONARY ANGIOGRAPHY;  Surgeon: Sherren Mocha, MD;  Location: [facility];  Service: Cardiovascular;  Laterality: N/A;   Past Medical History:  Diagnosis Date   Asthma  Chronic airway obstruction, not elsewhere classified    Diastolic dysfunction without heart failure    Disorder of bone and cartilage, unspecified    Dysrhythmia    A-Fib   Family history of colon cancer    Family history of kidney cancer    Family history of ovarian cancer    Paroxysmal atrial flutter (HCC)    Stroke ([place])    TIA   TIA (transient ischemic attack)    Unspecified asthma(493.90)    Ht '5\' 10"'$  (1.778 m)   Wt 191 lb (86.6 kg)   BMI 27.41 kg/m   Opioid Risk Score:   Fall Risk Score:  `1  Depression screen [facility] 2/9     11/16/2021    9:33 AM 11/16/2021    9:31 AM 09/08/2021    1:05 PM 07/29/2021   12:50 PM 11/04/2020    9:19 AM 04/08/2020    9:21 AM 02/26/2019    8:37 AM  Depression screen PHQ 2/9  Decreased Interest 0 0 0 0 0 0 0  Down, Depressed, Hopeless 0 0 0 0 0 0 0  PHQ - 2 Score 0 0 0 0 0 0 0  Altered sleeping    0     Tired, decreased energy    0     Change in appetite    0     Feeling bad or failure about yourself     0     Trouble concentrating    0     Moving slowly or fidgety/restless    0     Suicidal thoughts    0     PHQ-9 Score    0     Difficult doing work/chores    Not difficult at all       Review of Systems     Objective:   Physical Exam Vitals and nursing note reviewed.  Constitutional:      Appearance: She is normal weight.  HENT:     Head: Normocephalic and atraumatic.  Eyes:     Extraocular Movements: Extraocular movements intact.     Conjunctiva/sclera: Conjunctivae normal.     Pupils: Pupils are equal, round, and reactive to light.  Skin:    General: Skin is warm and dry.   Neurological:     Mental Status: She is alert and oriented to person, place, and time.     Comments: 5/5 strength left upper extremity left lower extremity 4+ right upper extremity and 5/5 right lower extremity and sensation is intact dysarthria or aphasia.  Cerebellar testing no dysmetria on finger-nose-finger testing Dysdiadochokinesis with rapid alternating supination pronation of the right upper extremity. Gait slightly wide-based with toe drag and instability  Psychiatric:        Mood and Affect: Mood normal.        Behavior: Behavior normal.           Assessment & Plan:   1.  Bilateral subcortical infarcts in the setting of atrial fibrillation. Excellent recovery.  Basically at a level, driving.  Follow-up physical medicine rehab on as needed basis

## 2021-12-13 ENCOUNTER — Encounter (INDEPENDENT_AMBULATORY_CARE_PROVIDER_SITE_OTHER): Payer: Medicare Other | Admitting: Cardiology

## 2021-12-13 DIAGNOSIS — I214 Non-ST elevation (NSTEMI) myocardial infarction: Secondary | ICD-10-CM

## 2021-12-15 NOTE — Telephone Encounter (Signed)

## 2021-12-16 DIAGNOSIS — I6340 Cerebral infarction due to embolism of unspecified cerebral artery: Secondary | ICD-10-CM | POA: Diagnosis not present

## 2021-12-17 ENCOUNTER — Ambulatory Visit
Admission: RE | Admit: 2021-12-17 | Discharge: 2021-12-17 | Disposition: A | Discharge status: Home / Self Care | Payer: Medicare Other | Source: Ambulatory Visit | Attending: Internal Medicine | Admitting: Internal Medicine

## 2021-12-17 DIAGNOSIS — Z1231 Encounter for screening mammogram for malignant neoplasm of breast: Secondary | ICD-10-CM

## 2021-12-17 HISTORY — DX: Malignant neoplasm of unspecified site of unspecified female breast: C50.919

## 2021-12-17 NOTE — Telephone Encounter (Signed)
Dr Marlou Porch' message has been reviewed via [place].  Metoprolol removed from pt's medication list.

## 2021-12-18 DIAGNOSIS — I6340 Cerebral infarction due to embolism of unspecified cerebral artery: Secondary | ICD-10-CM | POA: Diagnosis not present

## 2021-12-21 ENCOUNTER — Ambulatory Visit: Payer: Medicare Other | Admitting: Internal Medicine

## 2021-12-22 DIAGNOSIS — I6340 Cerebral infarction due to embolism of unspecified cerebral artery: Secondary | ICD-10-CM | POA: Diagnosis not present

## 2021-12-23 ENCOUNTER — Other Ambulatory Visit: Payer: Self-pay | Admitting: Hematology and Oncology

## 2022-01-04 ENCOUNTER — Other Ambulatory Visit: Payer: Self-pay | Admitting: Hematology and Oncology

## 2022-01-07 DIAGNOSIS — I6340 Cerebral infarction due to embolism of unspecified cerebral artery: Secondary | ICD-10-CM | POA: Diagnosis not present

## 2022-01-12 DIAGNOSIS — I6340 Cerebral infarction due to embolism of unspecified cerebral artery: Secondary | ICD-10-CM | POA: Diagnosis not present

## 2022-01-19 DIAGNOSIS — I6340 Cerebral infarction due to embolism of unspecified cerebral artery: Secondary | ICD-10-CM | POA: Diagnosis not present

## 2022-01-26 DIAGNOSIS — I6340 Cerebral infarction due to embolism of unspecified cerebral artery: Secondary | ICD-10-CM | POA: Diagnosis not present

## 2022-01-27 NOTE — Progress Notes (Signed)
Patient ID: Laura Mendez, female    DOB: 1940/06/20, 81 y.o.   MRN: [id number]  HPI F former smoker followed for Severe asthma/ COPD FEV1 09%,  complicated by allergic rhinitis,  paroxysmal A. Fib Labs 11/23/16- allergy profile total IgE 409 on Xolair, elevated specific IgE for dust mite and cockroach. EOS 3,300, Nl ANA and alpha-gal Derm Path report reviewed- Spongiotic/ eczema process- does not describe it as urticaria. Xolair was changed to [facility] June, 2018, to see if Xolair was causing her rash-no improvement after 5 months Office Spirometry 12/20/2017-very severe obstructive airways disease.  FVC 2.09/60%, FEV1 0.90/34%, ratio 0.43 Quantiferon tb Gold 06/22/17- Neg ---------------------------------------------------------------.   06/01/21-  81 year old female former smoker followed for severe asthma/COPD(formerly used Xolair/Nucala), complicated by allergic rhinitis, paroxysmal A. Fib/ Xarelto, CVA, Rash/ Atopic Dermatitis, breast cancer R,  -Xarelto,  neb Duoneb, Wixela 250, ventolin hfa, Dupixent,  Covid vax-3 Phizer Flu vax- had Dermatology treats with Dupixent.  [facility] dermatology Dr. Clovis Riley is taking over her Dupixent injections for her skin.  She shows me extensive excoriation. Watery sniffle blamed on "pollen" but otherwise she says she feels well.  Rarely needs nebulizer machine.  Very restricted social exposure-son still does most of her shopping.  01/28/22- 81 year old female former smoker followed for severe asthma/COPD(formerly used Xolair/Nucala), complicated by allergic rhinitis, paroxysmal A. Fib/ Xarelto., MI, CVA, Rash/ Atopic Dermatitis, breast cancer R,  -Xarelto,  neb Duoneb, Wixela 250, ventolin hfa, Dupixent,  Covid vax-4 Phizer Blanche East, NP 09/01/21- Exacerb Asthma>Depo 80, prednisone taper, CXR Hosp 11/22/21- NSTEMI ------She is about the same. She feels that she is having a flare in the last few weeks.  Bothersome pruritic rash ep wrists, shoulders,  elbows. Dermatologist has done"all they could". NSTEMI in July. Chest a little tight without cough or wheeze. She asks depomedrol for rash and breathing.  Sons watch over her. CXR 11/23/21 1V- IMPRESSION: Ill-defined patchy bibasilar opacities, not seen on prior exam. This may represent atelectasis, pneumonia, or combination thereof. CXR 09/01/21- IMPRESSION: Negative for acute cardiopulmonary disease   Review of Systems-see HPI   + = positive Constitutional:   No-   weight loss, night sweats, fevers, chills, fatigue, lassitude. HEENT:   No-  headaches, difficulty swallowing, tooth/dental problems, sore throat,       No-  sneezing, +itching, ear ache,  nasal congestion, +post nasal drip,  CV:  No-   chest pain, orthopnea, PND, swelling in lower extremities, anasarca, dizziness, palpitations Resp:  + shortness of breath with exertion or at rest- unchanged             No-   productive cough,  + non-productive cough,  No-  coughing up of blood.              No-   change in color of mucus.  + wheezing.   Skin:  +rash or lesions. GI:  No-   heartburn, indigestion, abdominal pain, nausea, vomiting, GU:  MS:  +   joint pain or swelling.   Neuro- nothing unusual  Psych:  No- change in mood or affect. No depression or anxiety.  No memory loss  Objective:    General- Alert, Oriented, Affect-appropriate, Distress- none acute, Pleasant, + Overweight Skin- + areas of excoriation Lymphadenopathy- none Head- atraumatic            Eyes- Gross vision intact, PERRLA, conjunctivae clear secretions            Ears- Hearing, canals normal  Nose- Mild turbinate edema, No-Septal dev, mucus, polyps, erosion, perforation             Throat- Mallampati II , mucosa -not red, drainage- none, tonsils- atrophic Neck- flexible , trachea midline, no stridor , thyroid nl, carotid no bruit Chest - symmetrical excursion , unlabored           Heart/CV- RRR +, no murmur , no gallop  , no rub, nl s1 s2                            - JVD- none , edema- none, stasis changes- none, varices- none           Lung- +clear to P&A/ distant/unlabored, wheeze- none,  Cough-none, dullness-none, rub- none           Chest wall-  Abd-  Br/ Gen/ Rectal- Not done, not indicated Extrem- cyanosis- none, clubbing, none, atrophy- none, strength- nl Neuro- grossly intact to observation

## 2022-01-28 ENCOUNTER — Ambulatory Visit: Payer: Medicare Other | Admitting: Internal Medicine

## 2022-01-28 ENCOUNTER — Encounter: Payer: Self-pay | Admitting: Internal Medicine

## 2022-01-28 ENCOUNTER — Ambulatory Visit (INDEPENDENT_AMBULATORY_CARE_PROVIDER_SITE_OTHER): Payer: Medicare Other

## 2022-01-28 VITALS — BP 124/80 | HR 80 | Temp 97.5°F | Ht 70.0 in | Wt 196.0 lb

## 2022-01-28 DIAGNOSIS — J449 Chronic obstructive pulmonary disease, unspecified: Secondary | ICD-10-CM

## 2022-01-28 DIAGNOSIS — J441 Chronic obstructive pulmonary disease with (acute) exacerbation: Secondary | ICD-10-CM

## 2022-01-28 DIAGNOSIS — R21 Rash and other nonspecific skin eruption: Secondary | ICD-10-CM

## 2022-01-28 MED ORDER — PREDNISONE 10 MG PO TABS: ORAL_TABLET | ORAL | 3 refills | Status: DC

## 2022-01-28 MED ORDER — METHYLPREDNISOLONE ACETATE 80 MG/ML IJ SUSP
80.0000 mg | Freq: Once | INTRAMUSCULAR | Status: AC
  Administered 2022-01-28: 80 mg via INTRAMUSCULAR

## 2022-01-28 NOTE — Patient Instructions (Addendum)
Order- depo 80    Dx exacerbation of COPD  Script sent for prednisone taper  Order - CXR  dx exacerbation COPD

## 2022-01-28 NOTE — Assessment & Plan Note (Addendum)
Dupixent seems to help. Meds reviewed. Plan- continue current meds. Update CXR  She asks prednisone taper to hold

## 2022-01-28 NOTE — Assessment & Plan Note (Signed)
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 Plan- depo 80

## 2022-02-01 DIAGNOSIS — Z961 Presence of intraocular lens: Secondary | ICD-10-CM | POA: Diagnosis not present

## 2022-02-01 DIAGNOSIS — H349 Unspecified retinal vascular occlusion: Secondary | ICD-10-CM | POA: Diagnosis not present

## 2022-02-01 DIAGNOSIS — H5213 Myopia, bilateral: Secondary | ICD-10-CM | POA: Diagnosis not present

## 2022-02-02 DIAGNOSIS — I6340 Cerebral infarction due to embolism of unspecified cerebral artery: Secondary | ICD-10-CM | POA: Diagnosis not present

## 2022-02-03 ENCOUNTER — Encounter: Payer: Self-pay | Admitting: Cardiology

## 2022-02-03 ENCOUNTER — Ambulatory Visit: Payer: Medicare Other | Attending: Cardiology | Admitting: Cardiology

## 2022-02-03 VITALS — BP 110/72 | HR 84 | Ht 70.0 in | Wt 194.0 lb

## 2022-02-03 DIAGNOSIS — I5032 Chronic diastolic (congestive) heart failure: Secondary | ICD-10-CM | POA: Diagnosis not present

## 2022-02-03 DIAGNOSIS — I6340 Cerebral infarction due to embolism of unspecified cerebral artery: Secondary | ICD-10-CM

## 2022-02-03 DIAGNOSIS — I480 Paroxysmal atrial fibrillation: Secondary | ICD-10-CM

## 2022-02-03 MED ORDER — APIXABAN 5 MG PO TABS: 5.0000 mg | ORAL_TABLET | Freq: Two times a day (BID) | ORAL | 1 refills | Status: DC

## 2022-02-03 NOTE — Progress Notes (Signed)
Electrophysiology Office Note:    Date:  02/03/2022   ID:  Laura Mendez, DOB 10-19-1940, MRN [id number]  PCP:  Hoyt Koch, MD  [facility] Cardiologist:  Candee Furbish, MD  [facility] Electrophysiologist:  Vickie Epley, MD   Referring MD: Finis Bud, NP   Chief Complaint: Atrial fibrillation  History of Present Illness:    Laura Mendez is a 81 y.o. female who presents for an evaluation of atrial fibrillation at the request of Finis Bud, NP. Their medical history includes chronic diastolic heart failure, stroke, TIA, atrial fibrillation.  The patient was seen by Finis Bud December 02, 2021 shortly after being hospitalized with chest pain.   She had previously been prescribed Xarelto for stroke prophylaxis after receiving diagnosis of atrial flutter but this was changed to Eliquis after experiencing an embolic stroke while on Xarelto.  She presented to the hospital November 22, 2021 with chest pain in the setting of A-fib with RVR.  In that setting she had an NSTEMI.  Cardiac catheterization showed patent coronary arteries.  She was started on low-dose metoprolol at that time.   At the appointment with Finis Bud she was referred to discuss catheter ablation today.  Today, she is accompanied by her son. He reports that she had missed 2 full days of Eliquis July 1-2 due to her prescription running out. They have had difficulty with obtaining refills of Eliquis.  After 2 weeks of starting metoprolol she was not eating well, and had no energy, so it was discontinued. She has been feeling better off of metoprolol.  Since her stroke in January she has been struggling with some memory loss. However, they do note gradual improvement in her memory.   She denies any palpitations, shortness of breath, or peripheral edema. No lightheadedness, headaches, syncope, orthopnea, or PND.     Past Medical History:  Diagnosis Date   Asthma    Breast  cancer ([place])    Chronic airway obstruction, not elsewhere classified    Diastolic dysfunction without heart failure    Disorder of bone and cartilage, unspecified    Dysrhythmia    A-Fib   Family history of colon cancer    Family history of kidney cancer    Family history of ovarian cancer    Paroxysmal atrial flutter ([place])    Stroke ([place])    TIA   TIA (transient ischemic attack)    Unspecified asthma(493.90)     Past Surgical History:  Procedure Laterality Date   ABDOMINAL HYSTERECTOMY     BREAST LUMPECTOMY Right 12/30/2017   BREAST LUMPECTOMY WITH RADIOACTIVE SEED LOCALIZATION Right 12/30/2017   Procedure: BREAST LUMPECTOMY WITH RADIOACTIVE SEED LOCALIZATION X'S 3;  Surgeon: Rolm Bookbinder, MD;  Location: [facility];  Service: General;  Laterality: Right;   BUNIONECTOMY Bilateral    EYE SURGERY Bilateral    cataract removal   LEFT HEART CATH AND CORONARY ANGIOGRAPHY N/A 11/23/2021   Procedure: LEFT HEART CATH AND CORONARY ANGIOGRAPHY;  Surgeon: Sherren Mocha, MD;  Location: [facility];  Service: Cardiovascular;  Laterality: N/A;    Current Medications: Current Meds  Medication Sig   acetaminophen (TYLENOL) 325 MG tablet Take 2 tablets (650 mg total) by mouth every 4 (four) hours as needed for mild pain (or temp > 37.5 C (99.5 F)). (Patient taking differently: Take 650 mg by mouth daily as needed for headache.)   albuterol (VENTOLIN HFA) 108 (90 Base) MCG/ACT inhaler INHALE 2 PUFFS BY MOUTH EVERY 4  HOURS AS NEEDED   Calcium Carbonate Antacid (TUMS PO) Take 0.5 tablets by mouth at bedtime as needed (stomach upset).   DUPIXENT 300 MG/2ML SOPN Inject 300 mg into the skin every 14 (fourteen) days.   Fluticasone-Salmeterol (WIXELA INHUB) 250-50 MCG/DOSE AEPB Inhale 1 puff into the lungs 2 (two) times daily. (Patient taking differently: Inhale 1 puff into the lungs daily.)   Ketotifen Fumarate (ZADITOR OP) Place 1 drop into both eyes at bedtime as needed (allergies).   letrozole  (FEMARA) 2.5 MG tablet Take 1 tablet by mouth once daily   Multiple Vitamin (MULTIVITAMIN) tablet Take 1 tablet by mouth daily.   pantoprazole (PROTONIX) 40 MG tablet Take 1 tablet (40 mg total) by mouth daily.   predniSONE (DELTASONE) 10 MG tablet 4 X 2 DAYS, 3 X 2 DAYS, 2 X 2 DAYS, 1 X 2 DAYS   PRESCRIPTION MEDICATION Inhale 1 ampule into the lungs daily as needed (SOB). Nebulizer solution   VITAMIN D PO Take 1 tablet by mouth daily.   [DISCONTINUED] apixaban (ELIQUIS) 5 MG TABS tablet Take 1 tablet (5 mg total) by mouth 2 (two) times daily.   Current Facility-Administered Medications for the 02/03/22 encounter (Office Visit) with Vickie Epley, MD  Medication   Mepolizumab SOLR 100 mg     Allergies:   Clarithromycin, Aspirin, Ibuprofen, Codeine, Codeine phosphate, Diltiazem, Methotrexate, Tape, Tapentadol, and Verapamil hcl er   Social History   Socioeconomic History   Marital status: Divorced    Spouse name: Not on file   Number of children: Not on file   Years of education: Not on file   Highest education level: Not on file  Occupational History   Occupation: retired  Tobacco Use   Smoking status: Former    Years: 0.00    Types: Cigarettes    Quit date: 05/24/1977    Years since quitting: 44.7   Smokeless tobacco: Never   Tobacco comments:    7/7 pt states she occasionally smoked, let cigs "burn" more than she smoked them  Vaping Use   Vaping Use: Never used  Substance and Sexual Activity   Alcohol use: No   Drug use: No   Sexual activity: Not on file  Other Topics Concern   Not on file  Social History Narrative   Right handed   1 coke per day   Lives alone   Social Determinants of Health   Financial Resource Strain: Low Risk  (11/16/2021)   Overall Financial Resource Strain (CARDIA)    Difficulty of Paying Living Expenses: Not hard at all  Food Insecurity: No Food Insecurity (11/16/2021)   Hunger Vital Sign    Worried About Running Out of Food in the Last  Year: Never true    Ran Out of Food in the Last Year: Never true  Transportation Needs: No Transportation Needs (11/16/2021)   PRAPARE - Hydrologist (Medical): No    Lack of Transportation (Non-Medical): No  Physical Activity: Insufficiently Active (11/16/2021)   Exercise Vital Sign    Days of Exercise per Week: 2 days    Minutes of Exercise per Session: 30 min  Stress: No Stress Concern Present (11/16/2021)   [place]    Feeling of Stress : Not at all  Social Connections: Moderately Isolated (11/16/2021)   Social Connection and Isolation Panel [NHANES]    Frequency of Communication with Friends and Family: Three times a week  Frequency of Social Gatherings with Friends and Family: Three times a week    Attends Religious Services: More than 4 times per year    Active Member of Clubs or Organizations: No    Attends Archivist Meetings: Never    Marital Status: Divorced     Family History: The patient's family history includes COPD in her father; Cancer in an other family member; Cirrhosis in her sister; Colon cancer in her cousin, maternal uncle, and maternal uncle; Colon cancer (age of onset: 28) in her mother; Heart attack in her paternal uncle; Heart disease in her maternal grandfather; Kidney cancer (age of onset: 52) in her sister; Kidney cancer (age of onset: 33) in her brother; Lung cancer in her sister; Melanoma (age of onset: 59) in her sister; Ovarian cancer in her maternal aunt and maternal grandmother; Ovarian cancer (age of onset: 91) in her sister; Parkinson's disease in her brother; Rectal cancer in her paternal grandfather. There is no history of Breast cancer.  ROS:   Please see the history of present illness.    (+) Memory loss (+) Burning sensations between shoulder blades and in central chest. All other systems reviewed and are negative.  EKGs/Labs/Other Studies  Reviewed:    The following studies were reviewed today:  November 23, 2021 left heart catheterization No significant coronary artery disease   November 23, 2021 echo EF 60% RV normal dilated left atrium Trivial MR MAC   December 02, 2021 EKG shows sinus rhythm with PACs and PVCs   EKG:   EKG is personally reviewed.  02/03/2022:  EKG was not ordered.  Recent Labs: 06/29/2021: Magnesium 1.8 10/21/2021: ALT 15; TSH 2.20 11/24/2021: BUN 22; Creatinine, Ser 0.73; Hemoglobin 13.4; Platelets 283; Potassium 4.5; Sodium 141   Recent Lipid Panel    Component Value Date/Time   CHOL 129 11/24/2021 0203   TRIG 90 11/24/2021 0203   TRIG 183 (H) 03/04/2006 1034   HDL 42 11/24/2021 0203   CHOLHDL 3.1 11/24/2021 0203   VLDL 18 11/24/2021 0203   LDLCALC 69 11/24/2021 0203   LDLDIRECT 108.0 04/09/2020 0955    Physical Exam:    VS:  BP 110/72   Pulse 84   Ht _0  (1.778 m)   Wt 194 lb (88 kg)   SpO2 92%   BMI 27.84 kg/m     Wt Readings from Last 3 Encounters:  02/03/22 194 lb (88 kg)  01/28/22 196 lb (88.9 kg)  12/08/21 191 lb (86.6 kg)     GEN: Well nourished, well developed in no acute distress HEENT: Normal NECK: No JVD; No carotid bruits LYMPHATICS: No lymphadenopathy CARDIAC: RRR, no murmurs, rubs, gallops RESPIRATORY:  Clear to auscultation without rales, wheezing or rhonchi  ABDOMEN: Soft, non-tender, non-distended MUSCULOSKELETAL:  No edema; No deformity  SKIN: Warm and dry NEUROLOGIC:  Alert and oriented x 3 PSYCHIATRIC:  Normal affect       ASSESSMENT:    1. Paroxysmal atrial fibrillation with RVR ([place])   2. Cerebrovascular accident (CVA) due to embolism of cerebral artery ([employer])   3. Chronic diastolic CHF (congestive heart failure) (HCC)    PLAN:    In order of problems listed above:  #Paroxysmal atrial fibrillation Recent hospitalization for A-fib with RVR and NSTEMI secondary to demand ischemia.  On Eliquis for stroke prophylaxis.  I discussed treatment options  today for her atrial fibrillation including antiarrhythmic drug therapy and catheter ablation.  Given her intolerance of metoprolol, I do not think she  will tolerate antiarrhythmic drugs.  I discussed the catheter ablation procedure during today's appointment.  After our discussion, the patient and her son have decided to continue with conservative management strategy and monitor the burden of atrial fibrillation in the near term.  If she were to develop symptomatic atrial fibrillation, would favor catheter ablation to help achieve rhythm control.  She will continue Eliquis for stroke prophylaxis.   #Stroke/TIA history Continue Eliquis for secondary prophylaxis   #Chronic diastolic heart failure NYHA class II.  Warm and dry on exam. Rhythm control as above.  Follow-up in 3 months with APP to review burden of atrial fibrillation and to determine whether or not rhythm control strategies are indicated.  Medication Adjustments/Labs and Tests Ordered: Current medicines are reviewed at length with the patient today.  Concerns regarding medicines are outlined above.   No orders of the defined types were placed in this encounter.  Meds ordered this encounter  Medications   apixaban (ELIQUIS) 5 MG TABS tablet    Sig: Take 1 tablet (5 mg total) by mouth 2 (two) times daily.    Dispense:  180 tablet    Refill:  1   I,Mathew Stumpf,acting as a scribe for Vickie Epley, MD.,have documented all relevant documentation on the behalf of Vickie Epley, MD,as directed by  Vickie Epley, MD while in the presence of Vickie Epley, MD.  I, Vickie Epley, MD, have reviewed all documentation for this visit. The documentation on 02/03/22 for the exam, diagnosis, procedures, and orders are all accurate and complete.   Signed, Hilton Cork. Quentin Ore, MD, [facility], [facility] 02/03/2022 2:01 PM    Electrophysiology [facility]

## 2022-02-03 NOTE — Patient Instructions (Signed)
Medication Instructions:  none *If you need a refill on your cardiac medications before your next appointment, please call your pharmacy*   Lab Work: none If you have labs (blood work) drawn today and your tests are completely normal, you will receive your results only by: [place] (if you have MyChart) OR A paper copy in the mail If you have any lab test that is abnormal or we need to change your treatment, we will call you to review the results.   Testing/Procedures: none   Follow-Up: At [facility], you and your health needs are our priority.  As part of our continuing mission to provide you with exceptional heart care, we have created designated Provider Care Teams.  These Care Teams include your primary Cardiologist (physician) and Advanced Practice Providers (APPs -  Physician Assistants and Nurse Practitioners) who all work together to provide you with the care you need, when you need it.  We recommend signing up for the patient portal called "MyChart".  Sign up information is provided on this After Visit Summary.  MyChart is used to connect with patients for Virtual Visits (Telemedicine).  Patients are able to view lab/test results, encounter notes, upcoming appointments, etc.  Non-urgent messages can be sent to your provider as well.   To learn more about what you can do with MyChart, go to [email or web address].    Your next appointment:   3 month(s)  The format for your next appointment:   In Person  Provider:   You will see one of the following Advanced Practice Providers on your designated Care Team:   Tommye Standard, [place] Legrand Como "Jonni Sanger" Chalmers Cater, [place]       Important Information About Sugar

## 2022-02-03 NOTE — Progress Notes (Deleted)
Electrophysiology Office Note:    Date:  02/03/2022   ID:  Laura Mendez, DOB [birth year], MRN [id number]  PCP:  Hoyt Koch, MD  [facility] Cardiologist:  Candee Furbish, MD  [place] Electrophysiologist:  None   Referring MD: Finis Bud, NP   Chief Complaint: Atrial fibrillation  History of Present Illness:    Laura Mendez is a 81 y.o. female who presents for an evaluation of atrial fibrillation at the request of Finis Bud, NP. Their medical history includes chronic diastolic heart failure, stroke, TIA, atrial fibrillation.  The patient was seen by Finis Bud December 02, 2021 shortly after being hospitalized with chest pain.  She had previously been prescribed Xarelto for stroke prophylaxis after receiving diagnosis of atrial flutter but this was changed to Eliquis after experiencing an embolic stroke while on Xarelto.  She presented to the hospital November 22, 2021 with chest pain in the setting of A-fib with RVR.  In that setting she had an NSTEMI.  Cardiac catheterization showed patent coronary arteries.  She was started on low-dose metoprolol at that time.  At the appointment with Finis Bud she was referred to discuss catheter ablation today.     Past Medical History:  Diagnosis Date   Asthma    Breast cancer ([place])    Chronic airway obstruction, not elsewhere classified    Diastolic dysfunction without heart failure    Disorder of bone and cartilage, unspecified    Dysrhythmia    A-Fib   Family history of colon cancer    Family history of kidney cancer    Family history of ovarian cancer    Paroxysmal atrial flutter ([place])    Stroke ([place])    TIA   TIA (transient ischemic attack)    Unspecified asthma(493.90)     Past Surgical History:  Procedure Laterality Date   ABDOMINAL HYSTERECTOMY     BREAST LUMPECTOMY Right 12/30/2017   BREAST LUMPECTOMY WITH RADIOACTIVE SEED LOCALIZATION Right 12/30/2017   Procedure: BREAST  LUMPECTOMY WITH RADIOACTIVE SEED LOCALIZATION X'S 3;  Surgeon: Rolm Bookbinder, MD;  Location: [facility];  Service: General;  Laterality: Right;   BUNIONECTOMY Bilateral    EYE SURGERY Bilateral    cataract removal   LEFT HEART CATH AND CORONARY ANGIOGRAPHY N/A 11/23/2021   Procedure: LEFT HEART CATH AND CORONARY ANGIOGRAPHY;  Surgeon: Sherren Mocha, MD;  Location: [facility];  Service: Cardiovascular;  Laterality: N/A;    Current Medications: No outpatient medications have been marked as taking for the 02/03/22 encounter (Appointment) with Vickie Epley, MD.   Current Facility-Administered Medications for the 02/03/22 encounter (Appointment) with Vickie Epley, MD  Medication   Mepolizumab SOLR 100 mg     Allergies:   Clarithromycin, Aspirin, Ibuprofen, Codeine, Codeine phosphate, Diltiazem, Methotrexate, Tape, Tapentadol, and Verapamil hcl er   Social History   Socioeconomic History   Marital status: Divorced    Spouse name: Not on file   Number of children: Not on file   Years of education: Not on file   Highest education level: Not on file  Occupational History   Occupation: retired  Tobacco Use   Smoking status: Former    Years: 0.00    Types: Cigarettes    Quit date: 05/24/1977    Years since quitting: 44.7   Smokeless tobacco: Never   Tobacco comments:    7/7 pt states she occasionally smoked, let cigs "burn" more than she smoked them  Vaping Use   Vaping  Use: Never used  Substance and Sexual Activity   Alcohol use: No   Drug use: No   Sexual activity: Not on file  Other Topics Concern   Not on file  Social History Narrative   Right handed   1 coke per day   Lives alone   Social Determinants of Health   Financial Resource Strain: Low Risk  (11/16/2021)   Overall Financial Resource Strain (CARDIA)    Difficulty of Paying Living Expenses: Not hard at all  Food Insecurity: No Food Insecurity (11/16/2021)   Hunger Vital Sign    Worried About  Running Out of Food in the Last Year: Never true    Ran Out of Food in the Last Year: Never true  Transportation Needs: No Transportation Needs (11/16/2021)   PRAPARE - Hydrologist (Medical): No    Lack of Transportation (Non-Medical): No  Physical Activity: Insufficiently Active (11/16/2021)   Exercise Vital Sign    Days of Exercise per Week: 2 days    Minutes of Exercise per Session: 30 min  Stress: No Stress Concern Present (11/16/2021)   [place]    Feeling of Stress : Not at all  Social Connections: Moderately Isolated (11/16/2021)   Social Connection and Isolation Panel [NHANES]    Frequency of Communication with Friends and Family: Three times a week    Frequency of Social Gatherings with Friends and Family: Three times a week    Attends Religious Services: More than 4 times per year    Active Member of Clubs or Organizations: No    Attends Archivist Meetings: Never    Marital Status: Divorced     Family History: The patient's family history includes COPD in her father; Cancer in an other family member; Cirrhosis in her sister; Colon cancer in her cousin, maternal uncle, and maternal uncle; Colon cancer (age of onset: 1) in her mother; Heart attack in her paternal uncle; Heart disease in her maternal grandfather; Kidney cancer (age of onset: 46) in her sister; Kidney cancer (age of onset: 4) in her brother; Lung cancer in her sister; Melanoma (age of onset: 100) in her sister; Ovarian cancer in her maternal aunt and maternal grandmother; Ovarian cancer (age of onset: 45) in her sister; Parkinson's disease in her brother; Rectal cancer in her paternal grandfather. There is no history of Breast cancer.  ROS:   Please see the history of present illness.    All other systems reviewed and are negative.  EKGs/Labs/Other Studies Reviewed:    The following studies were reviewed  today:  November 23, 2021 left heart catheterization No significant coronary artery disease  November 23, 2021 echo EF 60% RV normal dilated left atrium Trivial MR MAC  December 02, 2021 EKG shows sinus rhythm with PACs and PVCs   Recent Labs: 06/29/2021: Magnesium 1.8 10/21/2021: ALT 15; TSH 2.20 11/24/2021: BUN 22; Creatinine, Ser 0.73; Hemoglobin 13.4; Platelets 283; Potassium 4.5; Sodium 141  Recent Lipid Panel    Component Value Date/Time   CHOL 129 11/24/2021 0203   TRIG 90 11/24/2021 0203   TRIG 183 (H) 03/04/2006 1034   HDL 42 11/24/2021 0203   CHOLHDL 3.1 11/24/2021 0203   VLDL 18 11/24/2021 0203   LDLCALC 69 11/24/2021 0203   LDLDIRECT 108.0 04/09/2020 0955    Physical Exam:    VS:  There were no vitals taken for this visit.    Wt Readings from  Last 3 Encounters:  01/28/22 196 lb (88.9 kg)  12/08/21 191 lb (86.6 kg)  12/02/21 198 lb 12.8 oz (90.2 kg)     GEN: *** Well nourished, well developed in no acute distress HEENT: Normal NECK: No JVD; No carotid bruits LYMPHATICS: No lymphadenopathy CARDIAC: ***RRR, no murmurs, rubs, gallops RESPIRATORY:  Clear to auscultation without rales, wheezing or rhonchi  ABDOMEN: Soft, non-tender, non-distended MUSCULOSKELETAL:  No edema; No deformity  SKIN: Warm and dry NEUROLOGIC:  Alert and oriented x 3 PSYCHIATRIC:  Normal affect       ASSESSMENT:    1. Paroxysmal atrial fibrillation with RVR ([place])   2. Cerebrovascular accident (CVA) due to embolism of cerebral artery ([place])   3. Chronic diastolic CHF (congestive heart failure) (HCC)    PLAN:    In order of problems listed above:  #Paroxysmal atrial fibrillation Recent hospitalization for A-fib with RVR and NSTEMI secondary to demand ischemia.  On Eliquis for stroke prophylaxis.  Discussed treatment options today for his AF including antiarrhythmic drug therapy and ablation. Discussed risks, recovery and likelihood of success. Discussed potential need for repeat ablation  procedures and antiarrhythmic drugs after an initial ablation. They wish to proceed with scheduling.  Risk, benefits, and alternatives to EP study and radiofrequency ablation for afib were also discussed in detail today. These risks include but are not limited to stroke, bleeding, vascular damage, tamponade, perforation, damage to the esophagus, lungs, and other structures, pulmonary vein stenosis, worsening renal function, and death. The patient understands these risk and wishes to proceed.  We will therefore proceed with catheter ablation at the next available time.  Carto, ICE, anesthesia are requested for the procedure.  Will also obtain CT PV protocol prior to the procedure to exclude LAA thrombus and further evaluate atrial anatomy.  #Stroke/TIA history Continue Eliquis for secondary prophylaxis  #Chronic diastolic heart failure NYHA class II.  Warm and dry on exam. Rhythm control as above.      Total time spent with patient today *** minutes. This includes reviewing records, evaluating the patient and coordinating care.  Medication Adjustments/Labs and Tests Ordered: Current medicines are reviewed at length with the patient today.  Concerns regarding medicines are outlined above.  No orders of the defined types were placed in this encounter.  No orders of the defined types were placed in this encounter.    Signed, Hilton Cork. Quentin Ore, MD, [facility], [facility] 02/03/2022 5:38 AM    Electrophysiology [facility]

## 2022-02-09 DIAGNOSIS — I6340 Cerebral infarction due to embolism of unspecified cerebral artery: Secondary | ICD-10-CM | POA: Diagnosis not present

## 2022-03-08 DIAGNOSIS — T50B95A Adverse effect of other viral vaccines, initial encounter: Secondary | ICD-10-CM | POA: Diagnosis not present

## 2022-03-08 DIAGNOSIS — Z23 Encounter for immunization: Secondary | ICD-10-CM | POA: Diagnosis not present

## 2022-03-08 DIAGNOSIS — T8069XA Other serum reaction due to other serum, initial encounter: Secondary | ICD-10-CM | POA: Diagnosis not present

## 2022-03-10 ENCOUNTER — Encounter (HOSPITAL_COMMUNITY): Payer: Self-pay | Admitting: Emergency Medicine

## 2022-03-10 ENCOUNTER — Ambulatory Visit (HOSPITAL_COMMUNITY)
Admission: EM | Admit: 2022-03-10 | Discharge: 2022-03-10 | Disposition: A | Discharge status: Home / Self Care | Payer: Medicare Other | Attending: Emergency Medicine | Admitting: Emergency Medicine

## 2022-03-10 DIAGNOSIS — L299 Pruritus, unspecified: Secondary | ICD-10-CM | POA: Diagnosis not present

## 2022-03-10 DIAGNOSIS — T50905A Adverse effect of unspecified drugs, medicaments and biological substances, initial encounter: Secondary | ICD-10-CM

## 2022-03-10 MED ORDER — METHYLPREDNISOLONE SODIUM SUCC 125 MG IJ SOLR
INTRAMUSCULAR | Status: AC
  Filled 2022-03-10: qty 2

## 2022-03-10 MED ORDER — PREDNISONE 20 MG PO TABS: 40.0000 mg | ORAL_TABLET | Freq: Every day | ORAL | 0 refills | Status: DC

## 2022-03-10 MED ORDER — METHYLPREDNISOLONE SODIUM SUCC 125 MG IJ SOLR
60.0000 mg | Freq: Once | INTRAMUSCULAR | Status: AC
Start: 2022-03-10 — End: 2022-03-10
  Administered 2022-03-10: 60 mg via INTRAMUSCULAR

## 2022-03-10 MED ORDER — HYDROXYZINE HCL 25 MG PO TABS: 25.0000 mg | ORAL_TABLET | Freq: Four times a day (QID) | ORAL | 0 refills | Status: DC

## 2022-03-10 NOTE — ED Triage Notes (Signed)
Pt reports had itching all over that started yesterday that not any better.  Took Benadryl '50mg'$  this morning. Took Prednisone '20mg'$  around 2pm.   Pt report got flu shot either yesterday or day before.

## 2022-03-10 NOTE — Discharge Instructions (Signed)
Your symptoms are most likely reaction to the flu vaccination  You have been given an injection of steroids today in the office to help minimize the inflammatory response that occurs with reactions which ideally will reduce your symptoms  Starting tomorrow begin prednisone every morning with food to continue above process  May use hydroxyzine every 6 hours as needed to help manage itching, be mindful this medication may make you drowsy  May also use topical Benadryl cream or calamine lotion over the skin  Avoid scratching and attempt to rub to prevent breaks in skin which will put you at risk for secondary infection  May follow-up with his urgent care as needed

## 2022-03-10 NOTE — ED Provider Notes (Signed)
[place]    CSN: [id number] Arrival date & time: 03/10/22  1855      History   Chief Complaint Chief Complaint  Patient presents with   Pruritis    HPI Quantina Dershem is a 81 y.o. female.   Patient presents with generalized pruritus and persistent scratching beginning within the last 1 to 2 days after receiving flu vaccination.  Has not had reaction prior.  Endorses a erythematous rash to the right upper extremity.  Has attempted use of Benadryl and 20 mg of prednisone which has been ineffective denies drainage, fevers, chills shortness of breath wheezing.  Past Medical History:  Diagnosis Date   Asthma    Breast cancer ([place])    Chronic airway obstruction, not elsewhere classified    Diastolic dysfunction without heart failure    Disorder of bone and cartilage, unspecified    Dysrhythmia    A-Fib   Family history of colon cancer    Family history of kidney cancer    Family history of ovarian cancer    Paroxysmal atrial flutter ([place])    Stroke ([place])    TIA   TIA (transient ischemic attack)    Unspecified asthma(493.90)     Patient Active Problem List   Diagnosis Date Noted   NSTEMI (non-ST elevated myocardial infarction) ([place]) 11/23/2021   Acute renal failure superimposed on stage 2 chronic kidney disease ([place]) 11/23/2021   Elevated troponin    Atopic dermatitis 10/21/2021   Seasonal allergies 10/21/2021   Actinic keratosis 96/78/9381   Lichenified eczema 01/75/1025   Asthma exacerbation 85/27/7824   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   CVA (cerebral vascular accident) ([place]) 06/28/2021   Chronic diastolic CHF (congestive heart failure) ([place]) 06/28/2021   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   Hematoma of left knee region 06/28/2021   Fall at home, initial encounter 06/28/2021   Pain in right knee 08/07/2019   Pain in right foot 03/11/2018   Malignant neoplasm of lower-outer quadrant of right breast of female, estrogen receptor  positive ([place]) 23/53/6144   Diastolic dysfunction 31/54/0086   Rash and nonspecific skin eruption 04/01/2016   Paroxysmal atrial fibrillation with RVR ([place]) 01/13/2016   Retinal vein occlusion 06/16/2015   Benign thyroid cyst 11/14/2013   History of adenomatous polyp of colon 11/01/2013   Arteriosclerotic cerebrovascular disease 10/30/2012   Abnormality of right ventricle of heart 10/19/2011   Hyperlipidemia, unspecified 10/19/2011   Abnormal echocardiogram 10/19/2011   Visual field defect 10/06/2011   Seasonal and perennial allergic rhinitis 08/06/2011   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/04/2007   Osteopenia 03/31/2007    Past Surgical History:  Procedure Laterality Date   ABDOMINAL HYSTERECTOMY     BREAST LUMPECTOMY Right 12/30/2017   BREAST LUMPECTOMY WITH RADIOACTIVE SEED LOCALIZATION Right 12/30/2017   Procedure: BREAST LUMPECTOMY WITH RADIOACTIVE SEED LOCALIZATION X'S 3;  Surgeon: Rolm Bookbinder, MD;  Location: [facility];  Service: General;  Laterality: Right;   BUNIONECTOMY Bilateral    EYE SURGERY Bilateral    cataract removal   LEFT HEART CATH AND CORONARY ANGIOGRAPHY N/A 11/23/2021   Procedure: LEFT HEART CATH AND CORONARY ANGIOGRAPHY;  Surgeon: Sherren Mocha, MD;  Location: [facility];  Service: Cardiovascular;  Laterality: N/A;    OB History   No obstetric history on file.      Home Medications    Prior to Admission medications   Medication Sig Start Date End Date Taking? Authorizing Provider  acetaminophen (TYLENOL) 325 MG tablet Take 2 tablets (650  mg total) by mouth every 4 (four) hours as needed for mild pain (or temp > 37.5 C (99.5 F)). Patient taking differently: Take 650 mg by mouth daily as needed for headache. 07/14/21   Angiulli, Lavon Paganini, PA-C  albuterol (VENTOLIN HFA) 108 (90 Base) MCG/ACT inhaler INHALE 2 PUFFS BY MOUTH EVERY 4 HOURS AS NEEDED 07/31/21   Deneise Lever, MD  apixaban (ELIQUIS) 5 MG TABS tablet Take 1 tablet (5 mg total) by mouth 2  (two) times daily. 02/03/22   Vickie Epley, MD  Calcium Carbonate Antacid (TUMS PO) Take 0.5 tablets by mouth at bedtime as needed (stomach upset).    [provider]  DUPIXENT 300 MG/2ML SOPN Inject 300 mg into the skin every 14 (fourteen) days. 10/30/21   [provider]  Fluticasone-Salmeterol (WIXELA INHUB) 250-50 MCG/DOSE AEPB Inhale 1 puff into the lungs 2 (two) times daily. Patient taking differently: Inhale 1 puff into the lungs daily. 07/24/20   Deneise Lever, MD  Ketotifen Fumarate (ZADITOR OP) Place 1 drop into both eyes at bedtime as needed (allergies).    [provider]  letrozole [facility]) 2.5 MG tablet Take 1 tablet by mouth once daily 01/05/22   Nicholas Lose, MD  Multiple Vitamin (MULTIVITAMIN) tablet Take 1 tablet by mouth daily.    [provider]  pantoprazole (PROTONIX) 40 MG tablet Take 1 tablet (40 mg total) by mouth daily. 08/17/21   Hoyt Koch, MD  predniSONE (DELTASONE) 10 MG tablet 4 X 2 DAYS, 3 X 2 DAYS, 2 X 2 DAYS, 1 X 2 DAYS 01/28/22   Deneise Lever, MD  PRESCRIPTION MEDICATION Inhale 1 ampule into the lungs daily as needed (SOB). Nebulizer solution    [provider]  VITAMIN D PO Take 1 tablet by mouth daily.    [provider]    Family History Family History  Problem Relation Age of Onset   COPD Father    Colon cancer Mother 34       mets to pancreas and liver   Kidney cancer Sister 56       d. 72   Lung cancer Sister        d. 48, smoker   Ovarian cancer Sister 77       d. 80   Cirrhosis Sister        d. 39   Melanoma Sister 41   Ovarian cancer Maternal Grandmother        d. 94   Heart disease Maternal Grandfather        rheumatic heart disease   Rectal cancer Paternal Grandfather        d. 45   Parkinson's disease Brother    Ovarian cancer Maternal Aunt    Colon cancer Maternal Uncle        d. 72-73   Heart attack Paternal Uncle        d. 35   Kidney cancer Brother 23    Colon cancer Maternal Uncle        d. 100   Cancer Other        MGMs pat 1/2 sister with uterine and rectal cancer   Colon cancer Cousin        d. 64 - mat first cousin   Breast cancer Neg Hx     Social History Social History   Tobacco Use   Smoking status: Former    Years: 0.00    Types: Cigarettes    Quit date:  05/24/1977    Years since quitting: 44.8   Smokeless tobacco: Never   Tobacco comments:    7/7 pt states she occasionally smoked, let cigs "burn" more than she smoked them  Vaping Use   Vaping Use: Never used  Substance Use Topics   Alcohol use: No   Drug use: No     Allergies   Clarithromycin, Aspirin, Ibuprofen, Codeine, Codeine phosphate, Diltiazem, Methotrexate, Tape, Tapentadol, and Verapamil hcl er   Review of Systems Review of Systems  Constitutional: Negative.   Respiratory: Negative.    Cardiovascular: Negative.   Skin:  Positive for rash. Negative for color change, pallor and wound.     Physical Exam Triage Vital Signs ED Triage Vitals  Enc Vitals Group     BP 03/10/22 1920 137/79     Pulse Rate 03/10/22 1920 88     Resp 03/10/22 1920 20     Temp 03/10/22 1920 98.3 F (36.8 C)     Temp Source 03/10/22 1920 Oral     SpO2 03/10/22 1920 95 %     Weight --      Height --      Head Circumference --      Peak Flow --      Pain Score 03/10/22 1919 0     Pain Loc --      Pain Edu? --      Excl. in [place]? --    No data found.  Updated Vital Signs BP 137/79 (BP Location: Left Arm)   Pulse 88   Temp 98.3 F (36.8 C) (Oral)   Resp 20   SpO2 95%   Visual Acuity Right Eye Distance:   Left Eye Distance:   Bilateral Distance:    Right Eye Near:   Left Eye Near:    Bilateral Near:     Physical Exam Constitutional:      Appearance: Normal appearance.  Eyes:     Extraocular Movements: Extraocular movements intact.  Pulmonary:     Effort: Pulmonary effort is normal.  Skin:    Comments: Erythematous maculopapular rash present to the  posterior of the right upper extremity with excoriations scattered throughout, nondraining, nontender  Neurological:     Mental Status: She is alert and oriented to person, place, and time. Mental status is at baseline.      UC Treatments / Results  Labs (all labs ordered are listed, but only abnormal results are displayed) Labs Reviewed - No data to display  EKG   Radiology No results found.  Procedures Procedures (including critical care time)  Medications Ordered in UC Medications - No data to display  Initial Impression / Assessment and Plan / UC Course  I have reviewed the triage vital signs and the nursing notes.  Pertinent labs & imaging results that were available during my care of the patient were reviewed by me and considered in my medical decision making (see chart for details).  Medication reaction, initial encounter, pruritus  Patient is persistently scratching throughout the entirety of the exam, primarily over the area of the rash, methylprednisolone injection given in the office and prescribed prednisone taper and hydroxyzine for outpatient use, discussed side effects of medication, recommended topical calamine lotion and Benadryl cream for additional support, advised against scratching and patient may rub the skin to prevent secondary infection, may follow-up with urgent care if symptoms persist or worsen Final Clinical Impressions(s) / UC Diagnoses   Final diagnoses:  None   Discharge Instructions  None    ED Prescriptions   None    PDMP not reviewed this encounter.   Hans Eden, NP 03/10/22 1947

## 2022-03-18 ENCOUNTER — Ambulatory Visit: Payer: Medicare Other | Admitting: Family Medicine

## 2022-03-24 ENCOUNTER — Encounter (HOSPITAL_COMMUNITY): Payer: Self-pay | Admitting: Emergency Medicine

## 2022-03-24 ENCOUNTER — Other Ambulatory Visit: Payer: Self-pay

## 2022-03-24 ENCOUNTER — Ambulatory Visit (HOSPITAL_COMMUNITY)
Admission: EM | Admit: 2022-03-24 | Discharge: 2022-03-24 | Disposition: A | Discharge status: Home / Self Care | Payer: Medicare Other | Attending: Urgent Care | Admitting: Urgent Care

## 2022-03-24 DIAGNOSIS — L409 Psoriasis, unspecified: Secondary | ICD-10-CM | POA: Diagnosis not present

## 2022-03-24 DIAGNOSIS — L309 Dermatitis, unspecified: Secondary | ICD-10-CM | POA: Diagnosis not present

## 2022-03-24 MED ORDER — DEXAMETHASONE SODIUM PHOSPHATE 10 MG/ML IJ SOLN
10.0000 mg | Freq: Once | INTRAMUSCULAR | Status: AC
  Administered 2022-03-24: 10 mg via INTRAMUSCULAR

## 2022-03-24 MED ORDER — METHYLPREDNISOLONE 4 MG PO TBPK: ORAL_TABLET | ORAL | 0 refills | Status: DC

## 2022-03-24 MED ORDER — PIMECROLIMUS 1 % EX CREA: TOPICAL_CREAM | Freq: Two times a day (BID) | CUTANEOUS | 0 refills | Status: DC

## 2022-03-24 MED ORDER — DEXAMETHASONE SODIUM PHOSPHATE 10 MG/ML IJ SOLN
INTRAMUSCULAR | Status: AC
  Filled 2022-03-24: qty 1

## 2022-03-24 NOTE — ED Provider Notes (Signed)
[place]    CSN: [id number] Arrival date & time: 03/24/22  1539      History   Chief Complaint Chief Complaint  Patient presents with   Rash    HPI Shearon Clonch is a 81 y.o. female.   Pleasant 81 year old female presents today due to concerns of a worsening rash.  States she has had this rash intermittently for many years.  States it always follows the same pattern.  Gets an itchy red swollen scaly rash primarily to her dorsal forearms and around her olecranon.  She states she also has a similar rash to her right volar wrist, and to her bilateral buttocks.  She has been seeing dermatology for years, has been tried on numerous different oral and topical medications.  Has tried and failed methotrexate.  She continues on Dupixent, but does not feel it has been helpful for her symptoms.  Has had several biopsies.  Her first biopsy per her dermatology report was suggestive of psoriasis.  She had another biopsy done roughly a year ago which was suggestive of spongiotic dermatitis.  Patient uses both triamcinolone and clobetasol daily.  Patient states that when it starts to become so intensely pruritic that she cannot sleep, she will come in and get a steroid shot and oral pills.  She is specifically requesting this today as even Benadryl and hydroxyzine are not helping with the itch.  She denies any new symptoms.   Rash   Past Medical History:  Diagnosis Date   Asthma    Breast cancer ([place])    Chronic airway obstruction, not elsewhere classified    Diastolic dysfunction without heart failure    Disorder of bone and cartilage, unspecified    Dysrhythmia    A-Fib   Family history of colon cancer    Family history of kidney cancer    Family history of ovarian cancer    Paroxysmal atrial flutter ([place])    Stroke ([place])    TIA   TIA (transient ischemic attack)    Unspecified asthma(493.90)     Patient Active Problem List   Diagnosis Date Noted   NSTEMI (non-ST  elevated myocardial infarction) ([place]) 11/23/2021   Acute renal failure superimposed on stage 2 chronic kidney disease ([place]) 11/23/2021   Elevated troponin    Atopic dermatitis 10/21/2021   Seasonal allergies 10/21/2021   Actinic keratosis 42/35/3614   Lichenified eczema 43/15/4008   Asthma exacerbation 67/61/9509   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   CVA (cerebral vascular accident) ([place]) 06/28/2021   Chronic diastolic CHF (congestive heart failure) ([place]) 06/28/2021   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   Hematoma of left knee region 06/28/2021   Fall at home, initial encounter 06/28/2021   Pain in right knee 08/07/2019   Pain in right foot 03/11/2018   Malignant neoplasm of lower-outer quadrant of right breast of female, estrogen receptor positive ([place]) 32/67/1245   Diastolic dysfunction 80/99/8338   Rash and nonspecific skin eruption 04/01/2016   Paroxysmal atrial fibrillation with RVR ([place]) 01/13/2016   Retinal vein occlusion 06/16/2015   Benign thyroid cyst 11/14/2013   History of adenomatous polyp of colon 11/01/2013   Arteriosclerotic cerebrovascular disease 10/30/2012   Abnormality of right ventricle of heart 10/19/2011   Hyperlipidemia, unspecified 10/19/2011   Abnormal echocardiogram 10/19/2011   Visual field defect 10/06/2011   Seasonal and perennial allergic rhinitis 08/06/2011   Asthma with COPD ([place]) 08/04/2007   Osteopenia 03/31/2007    Past Surgical History:  Procedure Laterality Date  ABDOMINAL HYSTERECTOMY     BREAST LUMPECTOMY Right 12/30/2017   BREAST LUMPECTOMY WITH RADIOACTIVE SEED LOCALIZATION Right 12/30/2017   Procedure: BREAST LUMPECTOMY WITH RADIOACTIVE SEED LOCALIZATION X'S 3;  Surgeon: Rolm Bookbinder, MD;  Location: [facility];  Service: General;  Laterality: Right;   BUNIONECTOMY Bilateral    EYE SURGERY Bilateral    cataract removal   LEFT HEART CATH AND CORONARY ANGIOGRAPHY N/A 11/23/2021   Procedure: LEFT HEART CATH AND CORONARY  ANGIOGRAPHY;  Surgeon: Sherren Mocha, MD;  Location: [facility];  Service: Cardiovascular;  Laterality: N/A;    OB History   No obstetric history on file.      Home Medications    Prior to Admission medications   Medication Sig Start Date End Date Taking? Authorizing Provider  methylPREDNISolone (MEDROL DOSEPAK) 4 MG TBPK tablet Take per dose pack instructions 03/24/22  Yes Jerlene Rockers L, PA  pimecrolimus (ELIDEL) 1 % cream Apply topically 2 (two) times daily. Do not occlude, do not use longer than 6 weeks 03/24/22  Yes Lennyn Gange L, PA  acetaminophen (TYLENOL) 325 MG tablet Take 2 tablets (650 mg total) by mouth every 4 (four) hours as needed for mild pain (or temp > 37.5 C (99.5 F)). Patient taking differently: Take 650 mg by mouth daily as needed for headache. 07/14/21   Angiulli, Lavon Paganini, PA-C  albuterol (VENTOLIN HFA) 108 (90 Base) MCG/ACT inhaler INHALE 2 PUFFS BY MOUTH EVERY 4 HOURS AS NEEDED 07/31/21   Deneise Lever, MD  apixaban (ELIQUIS) 5 MG TABS tablet Take 1 tablet (5 mg total) by mouth 2 (two) times daily. 02/03/22   Vickie Epley, MD  Calcium Carbonate Antacid (TUMS PO) Take 0.5 tablets by mouth at bedtime as needed (stomach upset).    [provider]  DUPIXENT 300 MG/2ML SOPN Inject 300 mg into the skin every 14 (fourteen) days. 10/30/21   [provider]  Fluticasone-Salmeterol (WIXELA INHUB) 250-50 MCG/DOSE AEPB Inhale 1 puff into the lungs 2 (two) times daily. Patient taking differently: Inhale 1 puff into the lungs daily. 07/24/20   Deneise Lever, MD  hydrOXYzine (ATARAX) 25 MG tablet Take 1 tablet (25 mg total) by mouth every 6 (six) hours. 03/10/22   White, Leitha Schuller, NP  Ketotifen Fumarate (ZADITOR OP) Place 1 drop into both eyes at bedtime as needed (allergies).    [provider]  letrozole [facility]) 2.5 MG tablet Take 1 tablet by mouth once daily 01/05/22   Nicholas Lose, MD  Multiple Vitamin (MULTIVITAMIN) tablet Take  1 tablet by mouth daily.    [provider]  pantoprazole (PROTONIX) 40 MG tablet Take 1 tablet (40 mg total) by mouth daily. 08/17/21   Hoyt Koch, MD  PRESCRIPTION MEDICATION Inhale 1 ampule into the lungs daily as needed (SOB). Nebulizer solution    [provider]  VITAMIN D PO Take 1 tablet by mouth daily.    [provider]    Family History Family History  Problem Relation Age of Onset   COPD Father    Colon cancer Mother 3       mets to pancreas and liver   Kidney cancer Sister 55       d. 41   Lung cancer Sister        d. 54, smoker   Ovarian cancer Sister 50       d. 78   Cirrhosis Sister        d. 29  Melanoma Sister 46   Ovarian cancer Maternal Grandmother        d. 38   Heart disease Maternal Grandfather        rheumatic heart disease   Rectal cancer Paternal Grandfather        d. 66   Parkinson's disease Brother    Ovarian cancer Maternal Aunt    Colon cancer Maternal Uncle        d. 72-73   Heart attack Paternal Uncle        d. 54   Kidney cancer Brother 27   Colon cancer Maternal Uncle        d. 38   Cancer Other        MGMs pat 1/2 sister with uterine and rectal cancer   Colon cancer Cousin        d. 1 - mat first cousin   Breast cancer Neg Hx     Social History Social History   Tobacco Use   Smoking status: Former    Years: 0.00    Types: Cigarettes    Quit date: 05/24/1977    Years since quitting: 44.8   Smokeless tobacco: Never   Tobacco comments:    7/7 pt states she occasionally smoked, let cigs "burn" more than she smoked them  Vaping Use   Vaping Use: Never used  Substance Use Topics   Alcohol use: No   Drug use: No     Allergies   Clarithromycin, Aspirin, Ibuprofen, Codeine, Codeine phosphate, Diltiazem, Methotrexate, Tape, Tapentadol, and Verapamil hcl er   Review of Systems Review of Systems  Skin:  Positive for rash.  As per HPI   Physical Exam Triage Vital Signs ED Triage  Vitals  Enc Vitals Group     BP 03/24/22 1609 132/71     Pulse Rate 03/24/22 1609 83     Resp 03/24/22 1609 (!) 22     Temp 03/24/22 1609 97.6 F (36.4 C)     Temp Source 03/24/22 1609 Oral     SpO2 03/24/22 1609 96 %     Weight --      Height --      Head Circumference --      Peak Flow --      Pain Score 03/24/22 1608 0     Pain Loc --      Pain Edu? --      Excl. in [place]? --    No data found.  Updated Vital Signs BP 132/71 (BP Location: Right Arm)   Pulse 83   Temp 97.6 F (36.4 C) (Oral)   Resp (!) 22   SpO2 96%   Visual Acuity Right Eye Distance:   Left Eye Distance:   Bilateral Distance:    Right Eye Near:   Left Eye Near:    Bilateral Near:     Physical Exam Vitals and nursing note reviewed.  Constitutional:      General: She is not in acute distress.    Appearance: Normal appearance. She is obese. She is not ill-appearing, toxic-appearing or diaphoretic.  HENT:     Head: Normocephalic and atraumatic.     Right Ear: External ear normal.     Left Ear: External ear normal.     Nose: Nose normal.     Mouth/Throat:     Mouth: Mucous membranes are moist.  Eyes:     General:        Right eye: No discharge.  Left eye: No discharge.     Extraocular Movements: Extraocular movements intact.     Pupils: Pupils are equal, round, and reactive to light.  Cardiovascular:     Rate and Rhythm: Normal rate.  Pulmonary:     Effort: Pulmonary effort is normal. No respiratory distress.  Skin:    General: Skin is warm and dry.     Capillary Refill: Capillary refill takes less than 2 seconds.     Coloration: Skin is not jaundiced.     Findings: Rash (erythematous, slightly raised rash with fine silvery scales to bilateral forearms dorsally, extending to olecranon. Some lichenification noted. Similar rash to R volar wrist, and bilateral buttocks near gluteal cleft) present. No bruising.  Neurological:     General: No focal deficit present.     Mental Status: She  is alert and oriented to person, place, and time.     Sensory: No sensory deficit.     Motor: No weakness.      UC Treatments / Results  Labs (all labs ordered are listed, but only abnormal results are displayed) Labs Reviewed - No data to display  EKG   Radiology No results found.  Procedures Procedures (including critical care time)  Medications Ordered in UC Medications  dexamethasone (DECADRON) injection 10 mg (10 mg Intramuscular Given 03/24/22 1657)    Initial Impression / Assessment and Plan / UC Course  I have reviewed the triage vital signs and the nursing notes.  Pertinent labs & imaging results that were available during my care of the patient were reviewed by me and considered in my medical decision making (see chart for details).     Psoriasis -patient had a biopsy several years ago suggestive of psoriasis.  Clinically, it appears most consistent with this to me as well.  Patient continues with topical steroid cream daily.  I have recommended she try Elidel, and have called in for her today.  Also recommended that she use selenium sulfide shampoo to remove the scales prior to applying any topical creams. 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  Patient is extremely bothered by the pruritus.  IM steroids given today, will discharge home with Medrol Dosepak.  Long-term risks of recurrent steroids discussed with patient.  Patient is not diabetic.  Encouraged her to follow-up with her dermatologist for further recommendations   Final Clinical Impressions(s) / UC Diagnoses   Final diagnoses:  Psoriasis  Dermatitis of multiple sites     Discharge Instructions      Your most recent biopsy results from dermatology are consistent with possible psoriasis versus spongiotic dermatitis. You were given an injection of dexamethasone in our office. Please take the steroid pack as directed. Please purchase over-the-counter head and  shoulders/selenium sulfide shampoo.  You would benefit to scrub this on your rash to remove the scale, prior to putting your clobetasol on.  You may continue to use the topical steroid creams given to you by your dermatologist. Since you have never tried Elidel however, I think this would be beneficial to try. You cannot use this longer than 6 weeks.    ED Prescriptions     Medication Sig Dispense Auth. Provider   methylPREDNISolone (MEDROL DOSEPAK) 4 MG TBPK tablet Take per dose pack instructions 21 tablet Jaryn Rosko L, PA   pimecrolimus (ELIDEL) 1 % cream Apply topically 2 (two) times daily. Do not occlude, do not use longer than 6 weeks 30 g Paco Cislo, [place], [place]  PDMP not reviewed this encounter.   Chaney Malling, [place] 03/24/22 [phone]

## 2022-03-24 NOTE — Discharge Instructions (Addendum)
Your most recent biopsy results from dermatology are consistent with possible psoriasis versus spongiotic dermatitis. You were given an injection of dexamethasone in our office. Please take the steroid pack as directed. Please purchase over-the-counter head and shoulders/selenium sulfide shampoo.  You would benefit to scrub this on your rash to remove the scale, prior to putting your clobetasol on.  You may continue to use the topical steroid creams given to you by your dermatologist. Since you have never tried Elidel however, I think this would be beneficial to try. You cannot use this longer than 6 weeks.

## 2022-03-24 NOTE — ED Triage Notes (Signed)
Bilateral elbow itching, redness, splotchy rash.  Right elbow has scabbing.   Reports a history of this and steroids usually improve rash.  Patient says she has not had a diagnosis.  Patient reports other random areas of itching, but not red, raw like elbows

## 2022-03-29 ENCOUNTER — Encounter (HOSPITAL_COMMUNITY): Payer: Self-pay

## 2022-03-29 ENCOUNTER — Ambulatory Visit (INDEPENDENT_AMBULATORY_CARE_PROVIDER_SITE_OTHER): Payer: Medicare Other

## 2022-03-29 ENCOUNTER — Ambulatory Visit (HOSPITAL_COMMUNITY)
Admission: EM | Admit: 2022-03-29 | Discharge: 2022-03-29 | Disposition: A | Discharge status: Home / Self Care | Payer: Medicare Other | Attending: Internal Medicine | Admitting: Internal Medicine

## 2022-03-29 DIAGNOSIS — Z792 Long term (current) use of antibiotics: Secondary | ICD-10-CM | POA: Insufficient documentation

## 2022-03-29 DIAGNOSIS — R0602 Shortness of breath: Secondary | ICD-10-CM

## 2022-03-29 DIAGNOSIS — Z79899 Other long term (current) drug therapy: Secondary | ICD-10-CM | POA: Diagnosis not present

## 2022-03-29 DIAGNOSIS — J441 Chronic obstructive pulmonary disease with (acute) exacerbation: Secondary | ICD-10-CM | POA: Diagnosis not present

## 2022-03-29 DIAGNOSIS — J9811 Atelectasis: Secondary | ICD-10-CM | POA: Diagnosis not present

## 2022-03-29 DIAGNOSIS — Z1152 Encounter for screening for COVID-19: Secondary | ICD-10-CM | POA: Insufficient documentation

## 2022-03-29 MED ORDER — IPRATROPIUM-ALBUTEROL 0.5-2.5 (3) MG/3ML IN SOLN
RESPIRATORY_TRACT | Status: AC
  Filled 2022-03-29: qty 3

## 2022-03-29 MED ORDER — METHYLPREDNISOLONE SODIUM SUCC 125 MG IJ SOLR
INTRAMUSCULAR | Status: AC
  Filled 2022-03-29: qty 2

## 2022-03-29 MED ORDER — ALBUTEROL SULFATE (2.5 MG/3ML) 0.083% IN NEBU
2.5000 mg | INHALATION_SOLUTION | Freq: Once | RESPIRATORY_TRACT | Status: AC
  Administered 2022-03-29: 2.5 mg via RESPIRATORY_TRACT

## 2022-03-29 MED ORDER — ALBUTEROL SULFATE (2.5 MG/3ML) 0.083% IN NEBU
INHALATION_SOLUTION | RESPIRATORY_TRACT | Status: AC
  Filled 2022-03-29: qty 3

## 2022-03-29 MED ORDER — DOXYCYCLINE HYCLATE 100 MG PO CAPS: 100.0000 mg | ORAL_CAPSULE | Freq: Two times a day (BID) | ORAL | 0 refills | Status: DC

## 2022-03-29 MED ORDER — IPRATROPIUM-ALBUTEROL 0.5-2.5 (3) MG/3ML IN SOLN
3.0000 mL | Freq: Once | RESPIRATORY_TRACT | Status: AC
  Administered 2022-03-29: 3 mL via RESPIRATORY_TRACT

## 2022-03-29 MED ORDER — METHYLPREDNISOLONE SODIUM SUCC 125 MG IJ SOLR
80.0000 mg | Freq: Once | INTRAMUSCULAR | Status: AC
  Administered 2022-03-29: 80 mg via INTRAMUSCULAR

## 2022-03-29 MED ORDER — BENZONATATE 100 MG PO CAPS: 100.0000 mg | ORAL_CAPSULE | Freq: Three times a day (TID) | ORAL | 0 refills | Status: DC

## 2022-03-29 MED ORDER — IPRATROPIUM-ALBUTEROL 0.5-2.5 (3) MG/3ML IN SOLN: 3.0000 mL | Freq: Once | RESPIRATORY_TRACT | Status: DC

## 2022-03-29 NOTE — ED Notes (Signed)
Discharge pending completion of breathing treatment

## 2022-03-29 NOTE — ED Provider Notes (Signed)
[place]    CSN: [id number] Arrival date & time: 03/29/22  1128      History   Chief Complaint Chief Complaint  Patient presents with  . Asthma  . Shortness of Breath    HPI Laura Mendez is a 81 y.o. female.    Asthma Associated symptoms include shortness of breath.  Shortness of Breath  Past Medical History:  Diagnosis Date  . Asthma   . Breast cancer ([place])   . Chronic airway obstruction, not elsewhere classified   . Diastolic dysfunction without heart failure   . Disorder of bone and cartilage, unspecified   . Dysrhythmia    A-Fib  . Family history of colon cancer   . Family history of kidney cancer   . Family history of ovarian cancer   . Paroxysmal atrial flutter ([place])   . Stroke [facility])    TIA  . TIA (transient ischemic attack)   . Unspecified asthma(493.90)     Patient Active Problem List   Diagnosis Date Noted  . NSTEMI (non-ST elevated myocardial infarction) ([place]) 11/23/2021  . Acute renal failure superimposed on stage 2 chronic kidney disease ([place]) 11/23/2021  . Elevated troponin   . Atopic dermatitis 10/21/2021  . Seasonal allergies 10/21/2021  . Actinic keratosis 10/21/2021  . Lichenified eczema 76/81/1572  . Asthma exacerbation 09/01/2021  . 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 CVA (cerebral vascular accident) ([place]) 06/28/2021  . Chronic diastolic CHF (congestive heart failure) ([place]) 06/28/2021  . Thrombophilia ([place]) 06/28/2021  . Hematoma of left knee region 06/28/2021  . Fall at home, initial encounter 06/28/2021  . Pain in right knee 08/07/2019  . Pain in right foot 03/11/2018  . Malignant neoplasm of lower-outer quadrant of right breast of female, estrogen receptor positive ([place]) 11/15/2017  . Diastolic dysfunction 62/07/5595  . Rash and nonspecific skin eruption 04/01/2016  . Paroxysmal atrial fibrillation with RVR ([place]) 01/13/2016  . Retinal vein occlusion 06/16/2015  . Benign thyroid cyst 11/14/2013   . History of adenomatous polyp of colon 11/01/2013  . Arteriosclerotic cerebrovascular disease 10/30/2012  . Abnormality of right ventricle of heart 10/19/2011  . Hyperlipidemia, unspecified 10/19/2011  . Abnormal echocardiogram 10/19/2011  . Visual field defect 10/06/2011  . Seasonal and perennial allergic rhinitis 08/06/2011  . Asthma with COPD ([place]) 08/04/2007  . Osteopenia 03/31/2007    Past Surgical History:  Procedure Laterality Date  . ABDOMINAL HYSTERECTOMY    . BREAST LUMPECTOMY Right 12/30/2017  . BREAST LUMPECTOMY WITH RADIOACTIVE SEED LOCALIZATION Right 12/30/2017   Procedure: BREAST LUMPECTOMY WITH RADIOACTIVE SEED LOCALIZATION X'S 3;  Surgeon: Rolm Bookbinder, MD;  Location: [facility];  Service: General;  Laterality: Right;  . BUNIONECTOMY Bilateral   . EYE SURGERY Bilateral    cataract removal  . LEFT HEART CATH AND CORONARY ANGIOGRAPHY N/A 11/23/2021   Procedure: LEFT HEART CATH AND CORONARY ANGIOGRAPHY;  Surgeon: Sherren Mocha, MD;  Location: [facility];  Service: Cardiovascular;  Laterality: N/A;    OB History   No obstetric history on file.      Home Medications    Prior to Admission medications   Medication Sig Start Date End Date Taking? Authorizing Provider  benzonatate (TESSALON) 100 MG capsule Take 1 capsule (100 mg total) by mouth every 8 (eight) hours. 03/29/22  Yes Talbot Grumbling, FNP  doxycycline (VIBRAMYCIN) 100 MG capsule Take 1 capsule (100 mg total) by mouth 2 (two) times daily. 03/29/22  Yes Talbot Grumbling, FNP  acetaminophen (  TYLENOL) 325 MG tablet Take 2 tablets (650 mg total) by mouth every 4 (four) hours as needed for mild pain (or temp > 37.5 C (99.5 F)). Patient taking differently: Take 650 mg by mouth daily as needed for headache. 07/14/21   Angiulli, Lavon Paganini, PA-C  albuterol (VENTOLIN HFA) 108 (90 Base) MCG/ACT inhaler INHALE 2 PUFFS BY MOUTH EVERY 4 HOURS AS NEEDED 07/31/21   Deneise Lever, MD  apixaban (ELIQUIS) 5  MG TABS tablet Take 1 tablet (5 mg total) by mouth 2 (two) times daily. 02/03/22   Vickie Epley, MD  Calcium Carbonate Antacid (TUMS PO) Take 0.5 tablets by mouth at bedtime as needed (stomach upset).    [provider]  DUPIXENT 300 MG/2ML SOPN Inject 300 mg into the skin every 14 (fourteen) days. 10/30/21   [provider]  Fluticasone-Salmeterol (WIXELA INHUB) 250-50 MCG/DOSE AEPB Inhale 1 puff into the lungs 2 (two) times daily. Patient taking differently: Inhale 1 puff into the lungs daily. 07/24/20   Deneise Lever, MD  hydrOXYzine (ATARAX) 25 MG tablet Take 1 tablet (25 mg total) by mouth every 6 (six) hours. 03/10/22   White, Leitha Schuller, NP  Ketotifen Fumarate (ZADITOR OP) Place 1 drop into both eyes at bedtime as needed (allergies).    [provider]  letrozole [facility]) 2.5 MG tablet Take 1 tablet by mouth once daily 01/05/22   Nicholas Lose, MD  methylPREDNISolone (MEDROL DOSEPAK) 4 MG TBPK tablet Take per dose pack instructions 03/24/22   Geryl Councilman L, PA  Multiple Vitamin (MULTIVITAMIN) tablet Take 1 tablet by mouth daily.    [provider]  pantoprazole (PROTONIX) 40 MG tablet Take 1 tablet (40 mg total) by mouth daily. 08/17/21   Hoyt Koch, MD  pimecrolimus (ELIDEL) 1 % cream Apply topically 2 (two) times daily. Do not occlude, do not use longer than 6 weeks 03/24/22   Crain, Whitney L, PA  PRESCRIPTION MEDICATION Inhale 1 ampule into the lungs daily as needed (SOB). Nebulizer solution    [provider]  VITAMIN D PO Take 1 tablet by mouth daily.    [provider]    Family History Family History  Problem Relation Age of Onset  . COPD Father   . Colon cancer Mother 84       mets to pancreas and liver  . Kidney cancer Sister 59       d. 58  . Lung cancer Sister        d. 71, smoker  . Ovarian cancer Sister 22       d. 23  . Cirrhosis Sister        d. 28  . Melanoma Sister 76  . Ovarian cancer Maternal  Grandmother        d. 26  . Heart disease Maternal Grandfather        rheumatic heart disease  . Rectal cancer Paternal Grandfather        d. 7  . Parkinson's disease Brother   . Ovarian cancer Maternal Aunt   . Colon cancer Maternal Uncle        d. 72-73  . Heart attack Paternal Uncle        d. 54  . Kidney cancer Brother 63  . Colon cancer Maternal Uncle        d. 44  . Cancer Other        MGMs pat 1/2 sister with uterine and rectal cancer  . Colon  cancer Cousin        d. 32 - mat first cousin  . Breast cancer Neg Hx     Social History Social History   Tobacco Use  . Smoking status: Former    Years: 0.00    Types: Cigarettes    Quit date: 05/24/1977    Years since quitting: 44.8  . Smokeless tobacco: Never  . Tobacco comments:    7/7 pt states she occasionally smoked, let cigs "burn" more than she smoked them  Vaping Use  . Vaping Use: Never used  Substance Use Topics  . Alcohol use: No  . Drug use: No     Allergies   Clarithromycin, Aspirin, Ibuprofen, Codeine, Codeine phosphate, Diltiazem, Methotrexate, Tape, Tapentadol, and Verapamil hcl er   Review of Systems Review of Systems  Respiratory:  Positive for shortness of breath.     Physical Exam Triage Vital Signs ED Triage Vitals  Enc Vitals Group     BP 03/29/22 1136 127/65     Pulse Rate 03/29/22 1136 87     Resp 03/29/22 1136 (!) 21     Temp 03/29/22 1136 (!) 97.4 F (36.3 C)     Temp Source 03/29/22 1136 Oral     SpO2 03/29/22 1136 94 %     Weight --      Height --      Head Circumference --      Peak Flow --      Pain Score 03/29/22 1135 0     Pain Loc --      Pain Edu? --      Excl. in [place]? --    No data found.  Updated Vital Signs BP 127/65 (BP Location: Right Arm)   Pulse 87   Temp (!) 97.4 F (36.3 C) (Oral)   Resp (!) 21   SpO2 94%   Visual Acuity Right Eye Distance:   Left Eye Distance:   Bilateral Distance:    Right Eye Near:   Left Eye Near:    Bilateral Near:      Physical Exam   UC Treatments / Results  Labs (all labs ordered are listed, but only abnormal results are displayed) Labs Reviewed  SARS CORONAVIRUS 2 (TAT 6-24 HRS)    EKG   Radiology DG Chest 2 View  Result Date: 03/29/2022 CLINICAL DATA:  Shortness of breath. EXAM: CHEST - 2 VIEW COMPARISON:  January 28, 2022. FINDINGS: The heart size and mediastinal contours are within normal limits. Mild left basilar subsegmental atelectasis is noted. Minimal right midlung subsegmental atelectasis is noted. The visualized skeletal structures are unremarkable. IMPRESSION: Bilateral subsegmental atelectasis as described above. Electronically Signed   By: Marijo Conception M.D.   On: 03/29/2022 12:19    Procedures Procedures (including critical care time)  Medications Ordered in UC Medications  ipratropium-albuterol (DUONEB) 0.5-2.5 (3) MG/3ML nebulizer solution 3 mL (3 mLs Nebulization Not Given 03/29/22 1307)  ipratropium-albuterol (DUONEB) 0.5-2.5 (3) MG/3ML nebulizer solution 3 mL (3 mLs Nebulization Given 03/29/22 1157)  methylPREDNISolone sodium succinate (SOLU-MEDROL) 125 mg/2 mL injection 80 mg (80 mg Intramuscular Given 03/29/22 1241)  albuterol (PROVENTIL) (2.5 MG/3ML) 0.083% nebulizer solution 2.5 mg (2.5 mg Nebulization Given 03/29/22 1305)    Initial Impression / Assessment and Plan / UC Course  I have reviewed the triage vital signs and the nursing notes.  Pertinent labs & imaging results that were available during my care of the patient were reviewed by me and considered in my medical  decision making (see chart for details).     *** Final Clinical Impressions(s) / UC Diagnoses   Final diagnoses:  COPD exacerbation ([place])  Shortness of breath     Discharge Instructions      You have asthma/COPD exacerbation. We have tested you for COVID today and this result will come back in the next 24 hours.  We will call you if this is positive.   Take doxycycline antibiotic twice  daily for the next 7 days.  You may take Tessalon Perles every 8 hours as needed for cough.  Do albuterol nebulizer at home every 4-6 hours scheduled for the next 24 hours, then as needed.  This will allow the steroid medicine to kick in and help reduce inflammation in your lungs causing your breathing difficulty.   Continue taking the prednisone Dosepak prescribed by the provider at your last visit.  Take this medicine with food to avoid stomach upset.  We gave you a shot of steroid today to further reduce inflammation in your lungs.  If you develop any new or worsening symptoms or do not improve in the next 2 to 3 days, please return.  If your symptoms are severe, please go to the emergency room.  Follow-up with your primary care provider for further evaluation and management of your symptoms as well as ongoing wellness visits.  I hope you feel better!   ED Prescriptions     Medication Sig Dispense Auth. Provider   doxycycline (VIBRAMYCIN) 100 MG capsule Take 1 capsule (100 mg total) by mouth 2 (two) times daily. 20 capsule Joella Prince M, FNP   benzonatate (TESSALON) 100 MG capsule Take 1 capsule (100 mg total) by mouth every 8 (eight) hours. 21 capsule Talbot Grumbling, FNP      PDMP not reviewed this encounter.

## 2022-03-29 NOTE — Discharge Instructions (Addendum)
You have asthma/COPD exacerbation. We have tested you for COVID today and this result will come back in the next 24 hours.  We will call you if this is positive.   Take doxycycline antibiotic twice daily for the next 7 days.  You may take Tessalon Perles every 8 hours as needed for cough.  Do albuterol nebulizer at home every 4-6 hours scheduled for the next 24 hours, then as needed.  This will allow the steroid medicine to kick in and help reduce inflammation in your lungs causing your breathing difficulty.   Continue taking the prednisone Dosepak prescribed by the provider at your last visit.  Take this medicine with food to avoid stomach upset.  We gave you a shot of steroid today to further reduce inflammation in your lungs.  If you develop any new or worsening symptoms or do not improve in the next 2 to 3 days, please return.  If your symptoms are severe, please go to the emergency room.  Follow-up with your primary care provider for further evaluation and management of your symptoms as well as ongoing wellness visits.  I hope you feel better!

## 2022-03-29 NOTE — ED Triage Notes (Signed)
Patient has chronic Asthma and has been having SOB since being sick.  Patient has a albuterol neb at home that is not helping.

## 2022-03-30 LAB — SARS CORONAVIRUS 2 (TAT 6-24 HRS): SARS Coronavirus 2: NEGATIVE

## 2022-04-28 ENCOUNTER — Encounter (HOSPITAL_COMMUNITY): Payer: Self-pay

## 2022-04-28 ENCOUNTER — Ambulatory Visit (HOSPITAL_COMMUNITY)
Admission: EM | Admit: 2022-04-28 | Discharge: 2022-04-28 | Disposition: A | Discharge status: Home / Self Care | Payer: Medicare Other | Attending: Physician Assistant | Admitting: Physician Assistant

## 2022-04-28 DIAGNOSIS — L2089 Other atopic dermatitis: Secondary | ICD-10-CM | POA: Diagnosis not present

## 2022-04-28 DIAGNOSIS — L301 Dyshidrosis [pompholyx]: Secondary | ICD-10-CM | POA: Diagnosis not present

## 2022-04-28 MED ORDER — AMMONIUM LACTATE 12 % EX CREA: 1.0000 | TOPICAL_CREAM | CUTANEOUS | 1 refills | Status: AC | PRN

## 2022-04-28 MED ORDER — PREDNISONE 10 MG PO TABS: 10.0000 mg | ORAL_TABLET | Freq: Three times a day (TID) | ORAL | 0 refills | Status: DC

## 2022-04-28 MED ORDER — TRIAMCINOLONE ACETONIDE 0.1 % EX CREA: 1.0000 | TOPICAL_CREAM | Freq: Two times a day (BID) | CUTANEOUS | 0 refills | Status: DC

## 2022-04-28 NOTE — ED Provider Notes (Signed)
[place]    CSN: [id number] Arrival date & time: 04/28/22  1254      History   Chief Complaint Chief Complaint  Patient presents with   Rash    HPI Laura Mendez is a 81 y.o. female.   81 year old female presents with right forearm and bilateral hand rash.  Patient indicates that she has a history of asthma and she periodically does have rashes that develop and she has been diagnosed with atopic dermatitis in the past.  Patient indicates for the past week she has been having bilateral hand rash with redness and skin peeling.  She indicates that she has a similar rash developing on the right forearm inner aspect.  Patient indicates that the areas itch slightly.  She indicates she has been using some Vaseline cream but this has not given her any relief.  She also indicates she has not been using any type of unusual soaps, detergents, or shampoos.  She indicates she does not wash dishes.  She only uses Dove soap to wash her hair and to take a shower and it.  She also indicates that she has not changed her medicines over the past couple weeks and that she has not been working out in the yard in the garden with plants.  She indicates that she stays inside most of the time and does not touch dirt.   Rash   Past Medical History:  Diagnosis Date   Asthma    Breast cancer ([place])    Chronic airway obstruction, not elsewhere classified    Diastolic dysfunction without heart failure    Disorder of bone and cartilage, unspecified    Dysrhythmia    A-Fib   Family history of colon cancer    Family history of kidney cancer    Family history of ovarian cancer    Paroxysmal atrial flutter ([place])    Stroke ([place])    TIA   TIA (transient ischemic attack)    Unspecified asthma(493.90)     Patient Active Problem List   Diagnosis Date Noted   NSTEMI (non-ST elevated myocardial infarction) ([place]) 11/23/2021   Acute renal failure superimposed on stage 2 chronic kidney disease  ([place]) 11/23/2021   Elevated troponin    Atopic dermatitis 10/21/2021   Seasonal allergies 10/21/2021   Actinic keratosis 72/01/4708   Lichenified eczema 62/83/6629   Asthma exacerbation 47/65/4650   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   CVA (cerebral vascular accident) ([place]) 06/28/2021   Chronic diastolic CHF (congestive heart failure) ([place]) 06/28/2021   Thrombophilia ([place]) 06/28/2021   Hematoma of left knee region 06/28/2021   Fall at home, initial encounter 06/28/2021   Pain in right knee 08/07/2019   Pain in right foot 03/11/2018   Malignant neoplasm of lower-outer quadrant of right breast of female, estrogen receptor positive ([place]) 35/46/5681   Diastolic dysfunction 27/51/7001   Rash and nonspecific skin eruption 04/01/2016   Paroxysmal atrial fibrillation with RVR ([place]) 01/13/2016   Retinal vein occlusion 06/16/2015   Benign thyroid cyst 11/14/2013   History of adenomatous polyp of colon 11/01/2013   Arteriosclerotic cerebrovascular disease 10/30/2012   Abnormality of right ventricle of heart 10/19/2011   Hyperlipidemia, unspecified 10/19/2011   Abnormal echocardiogram 10/19/2011   Visual field defect 10/06/2011   Seasonal and perennial allergic rhinitis 08/06/2011   Asthma with COPD ([place]) 08/04/2007   Osteopenia 03/31/2007    Past Surgical History:  Procedure Laterality Date   ABDOMINAL HYSTERECTOMY     BREAST LUMPECTOMY Right  12/30/2017   BREAST LUMPECTOMY WITH RADIOACTIVE SEED LOCALIZATION Right 12/30/2017   Procedure: BREAST LUMPECTOMY WITH RADIOACTIVE SEED LOCALIZATION X'S 3;  Surgeon: Rolm Bookbinder, MD;  Location: [facility];  Service: General;  Laterality: Right;   BUNIONECTOMY Bilateral    EYE SURGERY Bilateral    cataract removal   LEFT HEART CATH AND CORONARY ANGIOGRAPHY N/A 11/23/2021   Procedure: LEFT HEART CATH AND CORONARY ANGIOGRAPHY;  Surgeon: Sherren Mocha, MD;  Location: [facility];  Service: Cardiovascular;  Laterality: N/A;    OB  History   No obstetric history on file.      Home Medications    Prior to Admission medications   Medication Sig Start Date End Date Taking? Authorizing Provider  ammonium lactate (LAC-HYDRIN) 12 % cream Apply 1 Application topically as needed for dry skin. 04/28/22  Yes Nyoka Lint, PA-C  predniSONE (DELTASONE) 10 MG tablet Take 1 tablet (10 mg total) by mouth 3 (three) times daily. 04/28/22  Yes Nyoka Lint, PA-C  triamcinolone cream (KENALOG) 0.1 % Apply 1 Application topically 2 (two) times daily. 04/28/22  Yes Nyoka Lint, PA-C  acetaminophen (TYLENOL) 325 MG tablet Take 2 tablets (650 mg total) by mouth every 4 (four) hours as needed for mild pain (or temp > 37.5 C (99.5 F)). Patient taking differently: Take 650 mg by mouth daily as needed for headache. 07/14/21   Angiulli, Lavon Paganini, PA-C  albuterol (VENTOLIN HFA) 108 (90 Base) MCG/ACT inhaler INHALE 2 PUFFS BY MOUTH EVERY 4 HOURS AS NEEDED 07/31/21   Deneise Lever, MD  apixaban (ELIQUIS) 5 MG TABS tablet Take 1 tablet (5 mg total) by mouth 2 (two) times daily. 02/03/22   Vickie Epley, MD  benzonatate (TESSALON) 100 MG capsule Take 1 capsule (100 mg total) by mouth every 8 (eight) hours. 03/29/22   Talbot Grumbling, FNP  Calcium Carbonate Antacid (TUMS PO) Take 0.5 tablets by mouth at bedtime as needed (stomach upset).    [provider]  doxycycline (VIBRAMYCIN) 100 MG capsule Take 1 capsule (100 mg total) by mouth 2 (two) times daily. 03/29/22   Talbot Grumbling, FNP  DUPIXENT 300 MG/2ML SOPN Inject 300 mg into the skin every 14 (fourteen) days. 10/30/21   [provider]  Fluticasone-Salmeterol (WIXELA INHUB) 250-50 MCG/DOSE AEPB Inhale 1 puff into the lungs 2 (two) times daily. Patient taking differently: Inhale 1 puff into the lungs daily. 07/24/20   Deneise Lever, MD  hydrOXYzine (ATARAX) 25 MG tablet Take 1 tablet (25 mg total) by mouth every 6 (six) hours. 03/10/22   White, Leitha Schuller, NP   Ketotifen Fumarate (ZADITOR OP) Place 1 drop into both eyes at bedtime as needed (allergies).    [provider]  letrozole [facility]) 2.5 MG tablet Take 1 tablet by mouth once daily 01/05/22   Nicholas Lose, MD  methylPREDNISolone (MEDROL DOSEPAK) 4 MG TBPK tablet Take per dose pack instructions 03/24/22   Geryl Councilman L, PA  Multiple Vitamin (MULTIVITAMIN) tablet Take 1 tablet by mouth daily.    [provider]  pantoprazole (PROTONIX) 40 MG tablet Take 1 tablet (40 mg total) by mouth daily. 08/17/21   Hoyt Koch, MD  pimecrolimus (ELIDEL) 1 % cream Apply topically 2 (two) times daily. Do not occlude, do not use longer than 6 weeks 03/24/22   Crain, Whitney L, PA  PRESCRIPTION MEDICATION Inhale 1 ampule into the lungs daily as needed (SOB). Nebulizer solution    [provider]  VITAMIN  D PO Take 1 tablet by mouth daily.    [provider]    Family History Family History  Problem Relation Age of Onset   COPD Father    Colon cancer Mother 73       mets to pancreas and liver   Kidney cancer Sister 53       d. 11   Lung cancer Sister        d. 41, smoker   Ovarian cancer Sister 72       d. 41   Cirrhosis Sister        d. 39   Melanoma Sister 34   Ovarian cancer Maternal Grandmother        d. 2   Heart disease Maternal Grandfather        rheumatic heart disease   Rectal cancer Paternal Grandfather        d. 26   Parkinson's disease Brother    Ovarian cancer Maternal Aunt    Colon cancer Maternal Uncle        d. 72-73   Heart attack Paternal Uncle        d. 21   Kidney cancer Brother 66   Colon cancer Maternal Uncle        d. 44   Cancer Other        MGMs pat 1/2 sister with uterine and rectal cancer   Colon cancer Cousin        d. 40 - mat first cousin   Breast cancer Neg Hx     Social History Social History   Tobacco Use   Smoking status: Former    Years: 0.00    Types: Cigarettes    Quit date: 05/24/1977    Years  since quitting: 44.9   Smokeless tobacco: Never   Tobacco comments:    7/7 pt states she occasionally smoked, let cigs "burn" more than she smoked them  Vaping Use   Vaping Use: Never used  Substance Use Topics   Alcohol use: No   Drug use: No     Allergies   Clarithromycin, Aspirin, Ibuprofen, Codeine, Codeine phosphate, Diltiazem, Methotrexate, Tape, Tapentadol, and Verapamil hcl er   Review of Systems Review of Systems  Skin:  Positive for rash (bilat hands and forearm).     Physical Exam Triage Vital Signs ED Triage Vitals [04/28/22 1530]  Enc Vitals Group     BP 128/89     Pulse Rate 82     Resp 12     Temp 98.4 F (36.9 C)     Temp Source Oral     SpO2 98 %     Weight      Height      Head Circumference      Peak Flow      Pain Score      Pain Loc      Pain Edu?      Excl. in [place]?    No data found.  Updated Vital Signs BP 128/89 (BP Location: Right Arm)   Pulse 82   Temp 98.4 F (36.9 C) (Oral)   Resp 12   SpO2 98%   Visual Acuity Right Eye Distance:   Left Eye Distance:   Bilateral Distance:    Right Eye Near:   Left Eye Near:    Bilateral Near:           Physical Exam Constitutional:      Appearance: Normal appearance.  Skin:  Comments: Bilateral anterior hand with a 3 x 2 cm areas of redness and mild skin peeling.  Posterior aspects of the hands are clear. Right inner forearm with an area of 6 x 4 cm redness with mild excoriations present.  (Refer to pictures for reference)  Neurological:     Mental Status: She is alert.      UC Treatments / Results  Labs (all labs ordered are listed, but only abnormal results are displayed) Labs Reviewed - No data to display  EKG   Radiology No results found.  Procedures Procedures (including critical care time)  Medications Ordered in UC Medications - No data to display  Initial Impression / Assessment and Plan / UC Course  I have reviewed the triage vital signs and  the nursing notes.  Pertinent labs & imaging results that were available during my care of the patient were reviewed by me and considered in my medical decision making (see chart for details).    Plan: 1.  The dyshidrotic hand dermatitis will be treated with the following: A.  Triamcinolone 0.1% cream mixed with Lac-Hydrin cream and apply to the area 3 times a day. 2.  The flexural atopic dermatitis be treated with the following: A.  Prednisone 10 mg 3 times daily until completed. 3.  Advised to follow-up with PCP or return to urgent care if symptoms fail to improve. Final Clinical Impressions(s) / UC Diagnoses   Final diagnoses:  Dyshidrotic hand dermatitis  Flexural atopic dermatitis     Discharge Instructions      Advised take prednisone 10 mg 3 times a day until completed as this will help reduce the inflammatory skin reaction. Advised to mix the Lac-Hydrin and the triamcinolone cream, equal amounts of both, mixed together and then applied to the skin and rub on 3 times daily. Advised follow-up PCP or return to urgent care if symptoms fail to improve.    ED Prescriptions     Medication Sig Dispense Auth. Provider   predniSONE (DELTASONE) 10 MG tablet Take 1 tablet (10 mg total) by mouth 3 (three) times daily. 18 tablet Nyoka Lint, PA-C   triamcinolone cream (KENALOG) 0.1 % Apply 1 Application topically 2 (two) times daily. 30 g Nyoka Lint, PA-C   ammonium lactate (LAC-HYDRIN) 12 % cream Apply 1 Application topically as needed for dry skin. 90 g Nyoka Lint, PA-C      PDMP not reviewed this encounter.   Nyoka Lint, PA-C 04/28/22 1601

## 2022-04-28 NOTE — ED Triage Notes (Signed)
Pt is here for rash on both arms , peeling of both hands x1wk

## 2022-04-28 NOTE — Discharge Instructions (Signed)
Advised take prednisone 10 mg 3 times a day until completed as this will help reduce the inflammatory skin reaction. Advised to mix the Lac-Hydrin and the triamcinolone cream, equal amounts of both, mixed together and then applied to the skin and rub on 3 times daily. Advised follow-up PCP or return to urgent care if symptoms fail to improve.

## 2022-05-13 ENCOUNTER — Other Ambulatory Visit: Payer: Self-pay | Admitting: Internal Medicine

## 2022-05-13 DIAGNOSIS — J4489 Other specified chronic obstructive pulmonary disease: Secondary | ICD-10-CM

## 2022-05-20 ENCOUNTER — Other Ambulatory Visit: Payer: Self-pay | Admitting: Internal Medicine

## 2022-06-04 ENCOUNTER — Other Ambulatory Visit: Payer: Self-pay | Admitting: Internal Medicine

## 2022-06-04 DIAGNOSIS — J4489 Other specified chronic obstructive pulmonary disease: Secondary | ICD-10-CM

## 2022-06-27 ENCOUNTER — Other Ambulatory Visit: Payer: Self-pay | Admitting: Internal Medicine

## 2022-06-27 DIAGNOSIS — J4489 Other specified chronic obstructive pulmonary disease: Secondary | ICD-10-CM

## 2022-07-06 DIAGNOSIS — L2084 Intrinsic (allergic) eczema: Secondary | ICD-10-CM | POA: Diagnosis not present

## 2022-07-06 DIAGNOSIS — Z5181 Encounter for therapeutic drug level monitoring: Secondary | ICD-10-CM | POA: Diagnosis not present

## 2022-07-06 DIAGNOSIS — L299 Pruritus, unspecified: Secondary | ICD-10-CM | POA: Diagnosis not present

## 2022-07-12 ENCOUNTER — Other Ambulatory Visit: Payer: Self-pay | Admitting: Internal Medicine

## 2022-07-12 DIAGNOSIS — J4489 Other specified chronic obstructive pulmonary disease: Secondary | ICD-10-CM

## 2022-07-14 NOTE — Telephone Encounter (Signed)
Last OV 01/28/22.  OK refill albuterol MDI.  Will need 6 month return OV per Dr. Annamaria Boots.  Return in about 6 months (around 07/29/2022).

## 2022-07-22 ENCOUNTER — Ambulatory Visit: Payer: Medicare Other | Attending: Cardiology | Admitting: Cardiology

## 2022-07-22 ENCOUNTER — Encounter: Payer: Self-pay | Admitting: Cardiology

## 2022-07-22 VITALS — BP 130/74 | HR 70 | Ht 70.0 in | Wt 203.0 lb

## 2022-07-22 DIAGNOSIS — I480 Paroxysmal atrial fibrillation: Secondary | ICD-10-CM

## 2022-07-22 DIAGNOSIS — I5032 Chronic diastolic (congestive) heart failure: Secondary | ICD-10-CM

## 2022-07-22 DIAGNOSIS — I21A1 Myocardial infarction type 2: Secondary | ICD-10-CM

## 2022-07-22 NOTE — Patient Instructions (Signed)
Medication Instructions:  Your physician recommends that you continue on your current medications as directed. Please refer to the Current Medication list given to you today.  *If you need a refill on your cardiac medications before your next appointment, please call your pharmacy*  Lab Work: Your physician recommends that you have lab work today- BMET and CBC  If you have labs (blood work) drawn today and your tests are completely normal, you will receive your results only by: MyChart Message (if you have MyChart) OR A paper copy in the mail If you have any lab test that is abnormal or we need to change your treatment, we will call you to review the results.  Testing/Procedures: None ordered today.  Follow-Up: At [facility], you and your health needs are our priority.  As part of our continuing mission to provide you with exceptional heart care, we have created designated Provider Care Teams.  These Care Teams include your primary Cardiologist (physician) and Advanced Practice Providers (APPs -  Physician Assistants and Nurse Practitioners) who all work together to provide you with the care you need, when you need it.  We recommend signing up for the patient portal called "MyChart".  Sign up information is provided on this After Visit Summary.  MyChart is used to connect with patients for Virtual Visits (Telemedicine).  Patients are able to view lab/test results, encounter notes, upcoming appointments, etc.  Non-urgent messages can be sent to your provider as well.   To learn more about what you can do with MyChart, go to [email or web address].    Your next appointment:   6 month(s)  Provider:   Nicholes Rough, PA-C, Melina Copa, PA-C, Ambrose Pancoast, NP, Cecilie Kicks, NP, Ermalinda Barrios, PA-C, Christen Bame, NP, or Richardson Dopp, PA-C

## 2022-07-22 NOTE — Progress Notes (Signed)
Cardiology Office Note:    Date:  07/22/2022   ID:  Laura Mendez, DOB August 31, 1940, MRN [id number]  PCP:  Hoyt Koch, MD  [facility] Cardiologist:  Candee Furbish, MD  [facility] Electrophysiologist:  Vickie Epley, MD   Referring MD: Hoyt Koch, *     History of Present Illness:    Laura Mendez is a 82 y.o. female here for the follow-up of atrial fibrillation/flutter.  Atrial flutter was diagnosed on 12/11/2015. She auto converted after receiving 5 mg of metoprolol.  Asthma seem to exacerbate this.  Has been on Eliquis with no major bleeding.  Echocardiogram in 2017 showed normal EF with severely dilated left atrium of 55 mm. Type 2 MI 2023 - cath with no significant flow limiting CAD  Dr. Quentin Ore discussion 2023 - she would like to hold off ablation.   Cardizem was previously stopped because of rash.  Verapamil was started.  Methotrexate was started by dermatology but now off, working on mab.  The rash did not seem to be related to drugs.  Still has the rash.  See below for details.  Denies any chest pain fevers chills nausea vomiting syncope bleeding her son was here today helps with historical content.  TIA with left eye partial vision loss Age greater than 29 Female   Past Medical History:  Diagnosis Date   Asthma    Breast cancer ([place])    Chronic airway obstruction, not elsewhere classified    Diastolic dysfunction without heart failure    Disorder of bone and cartilage, unspecified    Dysrhythmia    A-Fib   Family history of colon cancer    Family history of kidney cancer    Family history of ovarian cancer    Paroxysmal atrial flutter ([place])    Stroke ([place])    TIA   TIA (transient ischemic attack)    Unspecified asthma(493.90)     Past Surgical History:  Procedure Laterality Date   ABDOMINAL HYSTERECTOMY     BREAST LUMPECTOMY Right 12/30/2017   BREAST LUMPECTOMY WITH RADIOACTIVE SEED LOCALIZATION Right  12/30/2017   Procedure: BREAST LUMPECTOMY WITH RADIOACTIVE SEED LOCALIZATION X'S 3;  Surgeon: Rolm Bookbinder, MD;  Location: [place];  Service: General;  Laterality: Right;   BUNIONECTOMY Bilateral    EYE SURGERY Bilateral    cataract removal   LEFT HEART CATH AND CORONARY ANGIOGRAPHY N/A 11/23/2021   Procedure: LEFT HEART CATH AND CORONARY ANGIOGRAPHY;  Surgeon: Sherren Mocha, MD;  Location: [place] [facility];  Service: Cardiovascular;  Laterality: N/A;    Current Medications: Current Meds  Medication Sig   acetaminophen (TYLENOL) 325 MG tablet Take 2 tablets (650 mg total) by mouth every 4 (four) hours as needed for mild pain (or temp > 37.5 C (99.5 F)).   albuterol (VENTOLIN HFA) 108 (90 Base) MCG/ACT inhaler INHALE 2 PUFFS BY MOUTH EVERY 4 HOURS AS NEEDED   ammonium lactate (LAC-HYDRIN) 12 % cream Apply 1 Application topically as needed for dry skin.   apixaban (ELIQUIS) 5 MG TABS tablet Take 1 tablet (5 mg total) by mouth 2 (two) times daily.   DUPIXENT 300 MG/2ML SOPN Inject 300 mg into the skin every 14 (fourteen) days.   fluticasone-salmeterol (ADVAIR) 250-50 MCG/ACT AEPB INHALE 1 PUFF INTO LUNGS TWICE DAILY   hydrOXYzine (ATARAX) 25 MG tablet Take 1 tablet (25 mg total) by mouth every 6 (six) hours.   letrozole (FEMARA) 2.5 MG tablet Take 1 tablet by mouth once  daily   Multiple Vitamin (MULTIVITAMIN) tablet Take 1 tablet by mouth daily.   pantoprazole (PROTONIX) 40 MG tablet Take 1 tablet (40 mg total) by mouth daily.   pimecrolimus (ELIDEL) 1 % cream Apply topically 2 (two) times daily. Do not occlude, do not use longer than 6 weeks   PRESCRIPTION MEDICATION Inhale 1 ampule into the lungs daily as needed (SOB). Nebulizer solution   triamcinolone cream (KENALOG) 0.1 % Apply 1 Application topically 2 (two) times daily.   [DISCONTINUED] benzonatate (TESSALON) 100 MG capsule Take 1 capsule (100 mg total) by mouth every 8 (eight) hours.   [DISCONTINUED] Calcium Carbonate Antacid (TUMS  PO) Take 0.5 tablets by mouth at bedtime as needed (stomach upset).   [DISCONTINUED] doxycycline (VIBRAMYCIN) 100 MG capsule Take 1 capsule (100 mg total) by mouth 2 (two) times daily.   [DISCONTINUED] Ketotifen Fumarate (ZADITOR OP) Place 1 drop into both eyes at bedtime as needed (allergies).   [DISCONTINUED] methylPREDNISolone (MEDROL DOSEPAK) 4 MG TBPK tablet Take per dose pack instructions   [DISCONTINUED] predniSONE (DELTASONE) 10 MG tablet Take 1 tablet (10 mg total) by mouth 3 (three) times daily.   [DISCONTINUED] VITAMIN D PO Take 1 tablet by mouth daily.   Current Facility-Administered Medications for the 07/22/22 encounter (Office Visit) with Jerline Pain, MD  Medication   Mepolizumab SOLR 100 mg     Allergies:   Clarithromycin, Aspirin, Ibuprofen, Codeine, Codeine phosphate, Diltiazem, Methotrexate, Tape, Tapentadol, and Verapamil hcl er   Social History   Socioeconomic History   Marital status: Divorced    Spouse name: Not on file   Number of children: Not on file   Years of education: Not on file   Highest education level: Not on file  Occupational History   Occupation: retired  Tobacco Use   Smoking status: Former    Years: 0.00    Types: Cigarettes    Quit date: 05/24/1977    Years since quitting: 45.1   Smokeless tobacco: Never   Tobacco comments:    7/7 pt states she occasionally smoked, let cigs "burn" more than she smoked them  Vaping Use   Vaping Use: Never used  Substance and Sexual Activity   Alcohol use: No   Drug use: No   Sexual activity: Not on file  Other Topics Concern   Not on file  Social History Narrative   Right handed   1 coke per day   Lives alone   Social Determinants of Health   Financial Resource Strain: Low Risk  (11/16/2021)   Overall Financial Resource Strain (CARDIA)    Difficulty of Paying Living Expenses: Not hard at all  Food Insecurity: No Food Insecurity (11/16/2021)   Hunger Vital Sign    Worried About Running Out of  Food in the Last Year: Never true    Ran Out of Food in the Last Year: Never true  Transportation Needs: No Transportation Needs (11/16/2021)   PRAPARE - Hydrologist (Medical): No    Lack of Transportation (Non-Medical): No  Physical Activity: Insufficiently Active (11/16/2021)   Exercise Vital Sign    Days of Exercise per Week: 2 days    Minutes of Exercise per Session: 30 min  Stress: No Stress Concern Present (11/16/2021)   [place]    Feeling of Stress : Not at all  Social Connections: Moderately Isolated (11/16/2021)   Social Connection and Isolation Panel [NHANES]    Frequency  of Communication with Friends and Family: Three times a week    Frequency of Social Gatherings with Friends and Family: Three times a week    Attends Religious Services: More than 4 times per year    Active Member of Clubs or Organizations: No    Attends Archivist Meetings: Never    Marital Status: Divorced     Family History: The patient's family history includes COPD in her father; Cancer in an other family member; Cirrhosis in her sister; Colon cancer in her cousin, maternal uncle, and maternal uncle; Colon cancer (age of onset: 31) in her mother; Heart attack in her paternal uncle; Heart disease in her maternal grandfather; Kidney cancer (age of onset: 24) in her sister; Kidney cancer (age of onset: 20) in her brother; Lung cancer in her sister; Melanoma (age of onset: 81) in her sister; Ovarian cancer in her maternal aunt and maternal grandmother; Ovarian cancer (age of onset: 55) in her sister; Parkinson's disease in her brother; Rectal cancer in her paternal grandfather. There is no history of Breast cancer.  ROS:   Please see the history of present illness.     All other systems reviewed and are negative.  EKGs/Labs/Other Studies Reviewed:    The following studies were reviewed today: Prior  echocardiogram from 2017 reviewed as above.   Cardiac cath on 11/23/21 revealed: 1) Patent coronary arteries, right dominant, with mild irregularity but no significant stenoses. 2) Normal LVEDP Suspect demand ischemic event. The patient does not have any high-grade CAD.    EKG:  EKG is  ordered today.  The ekg ordered today demonstrates sinus rhythm 65 with no other abnormalities.  Recent Labs: 10/21/2021: ALT 15; TSH 2.20 11/24/2021: BUN 22; Creatinine, Ser 0.73; Hemoglobin 13.4; Platelets 283; Potassium 4.5; Sodium 141  Recent Lipid Panel    Component Value Date/Time   CHOL 129 11/24/2021 0203   TRIG 90 11/24/2021 0203   TRIG 183 (H) 03/04/2006 1034   HDL 42 11/24/2021 0203   CHOLHDL 3.1 11/24/2021 0203   VLDL 18 11/24/2021 0203   LDLCALC 69 11/24/2021 0203   LDLDIRECT 108.0 04/09/2020 0955        Physical Exam:    VS:  BP 130/74   Pulse 70   Ht '5\' 10"'$  (1.778 m)   Wt 203 lb (92.1 kg)   SpO2 94%   BMI 29.13 kg/m     Wt Readings from Last 3 Encounters:  07/22/22 203 lb (92.1 kg)  02/03/22 194 lb (88 kg)  01/28/22 196 lb (88.9 kg)     GEN:  Well nourished, well developed in no acute distress HEENT: Normal NECK: No JVD; No carotid bruits LYMPHATICS: No lymphadenopathy CARDIAC: RRR, no murmurs, rubs, gallops RESPIRATORY:  Clear to auscultation without rales, wheezing or rhonchi  ABDOMEN: Soft, non-tender, non-distended MUSCULOSKELETAL:  No edema; No deformity  SKIN: Warm and dry, rash noted.  NEUROLOGIC:  Alert and oriented x 3 PSYCHIATRIC:  Normal affect   ASSESSMENT:    1. Paroxysmal atrial fibrillation (HCC)   2. Chronic diastolic CHF (congestive heart failure) ([place])   3. Type 2 MI (myocardial infarction) ([place])     PLAN:    In order of problems listed above:  Typical atrial flutter/ fibrillation paroxysmal -2017 episode with auto conversion after metoprolol 5 mg. -Saw Dr. Quentin Ore 02/03/2022. At that time desired conservative approach.   Prior EKG  reassuring with sinus rhythm 65 bpm.  Chronic anticoagulation -She is continuing with Eliquis '5mg'$  BID.  Hemoglobin 13.3 creatinine 0.72 at last check.  From prior outside labs.  Type 2 MI --RVR with elevated trop. In 2023 troponin up to 5000.  Catheterization no significant disease  Stroke/ TIA --Eliquis, no bleeding.  No residual stroke symptoms.  Chronic asthma -Dr. Annamaria Boots.  Doing well.  Stable.  No changes made  Rash --Challenging for her.  Has seen several different dermatologist including [place].  Blood work.  Unable to obtain a unifying diagnosis.  Try to eliminate sugars in the diet.  Interestingly when she was in the hospital for 2 weeks for a stroke her rash went away.  Her son thinks that it could be Candida related.  He encourages her to stop sugar drinks like sweet tea Coca-Cola and also breads.  I agree.  1 year follow up  Shared Decision Making/Informed Consent        Medication Adjustments/Labs and Tests Ordered: Current medicines are reviewed at length with the patient today.  Concerns regarding medicines are outlined above.  Orders Placed This Encounter  Procedures   CBC with Differential/Platelet   Basic metabolic panel   No orders of the defined types were placed in this encounter.   Patient Instructions  Medication Instructions:  Your physician recommends that you continue on your current medications as directed. Please refer to the Current Medication list given to you today.  *If you need a refill on your cardiac medications before your next appointment, please call your pharmacy*  Lab Work: Your physician recommends that you have lab work today- BMET and CBC  If you have labs (blood work) drawn today and your tests are completely normal, you will receive your results only by: MyChart Message (if you have MyChart) OR A paper copy in the mail If you have any lab test that is abnormal or we need to change your treatment, we will call you to review the  results.  Testing/Procedures: None ordered today.  Follow-Up: At [facility], you and your health needs are our priority.  As part of our continuing mission to provide you with exceptional heart care, we have created designated Provider Care Teams.  These Care Teams include your primary Cardiologist (physician) and Advanced Practice Providers (APPs -  Physician Assistants and Nurse Practitioners) who all work together to provide you with the care you need, when you need it.  We recommend signing up for the patient portal called "MyChart".  Sign up information is provided on this After Visit Summary.  MyChart is used to connect with patients for Virtual Visits (Telemedicine).  Patients are able to view lab/test results, encounter notes, upcoming appointments, etc.  Non-urgent messages can be sent to your provider as well.   To learn more about what you can do with MyChart, go to [email or web address].    Your next appointment:   6 month(s)  Provider:   Nicholes Rough, PA-C, Melina Copa, PA-C, Ambrose Pancoast, NP, Cecilie Kicks, NP, Ermalinda Barrios, PA-C, Christen Bame, NP, or Richardson Dopp, PA-C        Signed, Candee Furbish, MD  07/22/2022 8:40 AM    [place]

## 2022-07-23 LAB — CBC WITH DIFFERENTIAL/PLATELET
Basophils Absolute: 0.1 10*3/uL (ref 0.0–0.2)
Basos: 0 %
EOS (ABSOLUTE): 0.3 10*3/uL (ref 0.0–0.4)
Eos: 2 %
Hematocrit: 44.3 % (ref 34.0–46.6)
Hemoglobin: 14.1 g/dL (ref 11.1–15.9)
Immature Grans (Abs): 0.1 10*3/uL (ref 0.0–0.1)
Immature Granulocytes: 1 %
Lymphocytes Absolute: 1.2 10*3/uL (ref 0.7–3.1)
Lymphs: 8 %
MCH: 28.1 pg (ref 26.6–33.0)
MCHC: 31.8 g/dL (ref 31.5–35.7)
MCV: 88 fL (ref 79–97)
Monocytes Absolute: 0.5 10*3/uL (ref 0.1–0.9)
Monocytes: 4 %
Neutrophils Absolute: 11.9 10*3/uL — ABNORMAL HIGH (ref 1.4–7.0)
Neutrophils: 85 %
Platelets: 256 10*3/uL (ref 150–450)
RBC: 5.02 x10E6/uL (ref 3.77–5.28)
RDW: 13.9 % (ref 11.7–15.4)
WBC: 14 10*3/uL — ABNORMAL HIGH (ref 3.4–10.8)

## 2022-07-23 LAB — BASIC METABOLIC PANEL
BUN/Creatinine Ratio: 16 (ref 12–28)
BUN: 16 mg/dL (ref 8–27)
CO2: 23 mmol/L (ref 20–29)
Calcium: 9.5 mg/dL (ref 8.7–10.3)
Chloride: 106 mmol/L (ref 96–106)
Creatinine, Ser: 0.99 mg/dL (ref 0.57–1.00)
Glucose: 100 mg/dL — ABNORMAL HIGH (ref 70–99)
Potassium: 4.3 mmol/L (ref 3.5–5.2)
Sodium: 144 mmol/L (ref 134–144)
eGFR: 57 mL/min/{1.73_m2} — ABNORMAL LOW (ref >59)

## 2022-07-26 ENCOUNTER — Other Ambulatory Visit: Payer: Self-pay | Admitting: Cardiology

## 2022-07-26 NOTE — Telephone Encounter (Signed)
Prescription refill request for Eliquis received. Indication: afib  Last office visit: [place], 07/22/2022 Scr: 0.99, 07/22/2022 Age: 82 yo  Weight: 92.1 kg

## 2022-07-27 ENCOUNTER — Other Ambulatory Visit: Payer: Self-pay | Admitting: Internal Medicine

## 2022-07-27 DIAGNOSIS — J4489 Other specified chronic obstructive pulmonary disease: Secondary | ICD-10-CM

## 2022-07-27 NOTE — Progress Notes (Unsigned)
Patient ID: Laura Mendez, female    DOB: Sep 30, 1940, 82 y.o.   MRN: [id number]  HPI F former smoker followed for Severe asthma/ COPD FEV1 [id number],  complicated by allergic rhinitis,  paroxysmal A. Fib Labs 11/23/16- allergy profile total IgE 409 on Xolair, elevated specific IgE for dust mite and cockroach. EOS 3,300, Nl ANA and alpha-gal Derm Path report reviewed- Spongiotic/ eczema process- does not describe it as urticaria. Xolair was changed to [facility] June, 2018, to see if Xolair was causing her rash-no improvement after 5 months Office Spirometry 12/20/2017-very severe obstructive airways disease.  FVC 2.09/60%, FEV1 0.90/34%, ratio 0.43 Quantiferon tb Gold 06/22/17- Neg ---------------------------------------------------------------.   01/28/22- 82 year old female former smoker followed for severe asthma/COPD(formerly used Xolair/Nucala), complicated by allergic rhinitis, paroxysmal A. Fib/ Xarelto., MI, CVA, Rash/ Atopic Dermatitis, breast cancer R,  -Xarelto,  neb Duoneb, Wixela 250, ventolin hfa, Dupixent,  Covid vax-4 Phizer Blanche [place], NP 09/01/21- Exacerb Asthma>Depo 80, prednisone taper, CXR Hosp 11/22/21- NSTEMI ------She is about the same. She feels that she is having a flare in the last few weeks.  Bothersome pruritic rash ep wrists, shoulders, elbows. Dermatologist has done"all they could". NSTEMI in July. Chest a little tight without cough or wheeze. She asks depomedrol for rash and breathing.  Sons watch over her. CXR 11/23/21 1V- IMPRESSION: Ill-defined patchy bibasilar opacities, not seen on prior exam. This may represent atelectasis, pneumonia, or combination thereof. CXR 09/01/21- IMPRESSION: Negative for acute cardiopulmonary disease  07/29/22-  82 year old female former smoker followed for severe asthma/COPD(formerly used Xolair/Nucala), complicated by allergic rhinitis, paroxysmal A. Fib/ Eliquis., MI, CVA, Rash/ Atopic Dermatitis, breast cancer R,  -Xarelto,  neb Duoneb,  Wixela 250, ventolin hfa, Dupixent,  Covid vax-4 Phizer Flu vax-had ED 03/29/22- Asthma> solumedrol, neb, doxycycline,  ------Patient states she is doing okay  Asks prednisone for exacerbation of her chronic rash, saying nothing else really helps.  She reports taking 5 or 10 mg maybe 4 times a month.  She continues Dupixent from dermatologist in [place] and admits it does help rash and seems to help her breathing some as well.  Her respiratory status had responded dramatically to Xolair years ago.  She was changed to [place] to see if that would help the rash which developed while she was on Xolair. Respiratory Meds otherwise are good and she feels her breathing is doing well.  Anticipates more problems as the spring pollens come in. CXR 03/29/22-  IMPRESSION: Bilateral subsegmental atelectasis as described above.   Review of Systems-see HPI   + = positive Constitutional:   No-   weight loss, night sweats, fevers, chills, fatigue, lassitude. HEENT:   No-  headaches, difficulty swallowing, tooth/dental problems, sore throat,       No-  sneezing, +itching, ear ache,  nasal congestion, +post nasal drip,  CV:  No-   chest pain, orthopnea, PND, swelling in lower extremities, anasarca, dizziness, palpitations Resp:  + shortness of breath with exertion or at rest- unchanged             No-   productive cough,  + non-productive cough,  No-  coughing up of blood.              No-   change in color of mucus.  + wheezing.   Skin:  +rash or lesions. GI:  No-   heartburn, indigestion, abdominal pain, nausea, vomiting, GU:  MS:  +   joint pain or swelling.   Neuro- nothing unusual  Psych:  No- change in mood or affect. No depression or anxiety.  No memory loss  Objective:    General- Alert, Oriented, Affect-appropriate, Distress- none acute, Pleasant, + Overweight Skin- + areas of erythema and some scale on pressure sites- extensor elbows, back, thighs Lymphadenopathy- none Head- atraumatic             Eyes- Gross vision intact, PERRLA, conjunctivae clear secretions            Ears- Hearing, canals normal            Nose- Mild turbinate edema, No-Septal dev, mucus, polyps, erosion, perforation             Throat- Mallampati II , mucosa -not red, drainage- none, tonsils- atrophic Neck- flexible , trachea midline, no stridor , thyroid nl, carotid no bruit Chest - symmetrical excursion , unlabored           Heart/CV- RRR +, no murmur , no gallop  , no rub, nl s1 s2                           - JVD- none , edema- none, stasis changes- none, varices- none           Lung- +clear to P&A/ distant/unlabored, wheeze- none,  Cough-none, dullness-none, rub- none           Chest wall-  Abd-  Br/ Gen/ Rectal- Not done, not indicated Extrem- cyanosis- none, clubbing, none, atrophy- none, strength- nl Neuro- grossly intact to observation

## 2022-07-29 ENCOUNTER — Ambulatory Visit: Payer: Medicare Other | Admitting: Internal Medicine

## 2022-07-29 ENCOUNTER — Encounter: Payer: Self-pay | Admitting: Internal Medicine

## 2022-07-29 VITALS — BP 118/82 | HR 76 | Ht 71.0 in | Wt 203.0 lb

## 2022-07-29 DIAGNOSIS — R21 Rash and other nonspecific skin eruption: Secondary | ICD-10-CM | POA: Diagnosis not present

## 2022-07-29 DIAGNOSIS — J4489 Other specified chronic obstructive pulmonary disease: Secondary | ICD-10-CM

## 2022-07-29 MED ORDER — PREDNISONE 10 MG PO TABS: ORAL_TABLET | ORAL | 0 refills | Status: DC

## 2022-07-29 NOTE — Assessment & Plan Note (Signed)
Variously diagnosed as eczema, atopic dermatitis.  May be helped some by Dupixent.  Notably worse now on pressure surfaces.  She says only prednisone helps but describes using 5 or 10 mg at a time, perhaps 4 times a month.  Steroid side effects discussed. Plan-prednisone 10 mg tabs to hold.

## 2022-07-29 NOTE — Patient Instructions (Addendum)
Script sent for prednisone to take occasionally as needed  We can continue current meds  Please call if we can help

## 2022-07-29 NOTE — Assessment & Plan Note (Signed)
Stable uncomplicated currently.  She avoids being outdoors with pollen exposure.  Dupixent may be helping. Plan-continue current meds

## 2022-08-17 ENCOUNTER — Other Ambulatory Visit: Payer: Self-pay | Admitting: Internal Medicine

## 2022-08-17 DIAGNOSIS — J4489 Other specified chronic obstructive pulmonary disease: Secondary | ICD-10-CM

## 2022-09-01 DIAGNOSIS — H532 Diplopia: Secondary | ICD-10-CM | POA: Diagnosis not present

## 2022-09-15 DIAGNOSIS — H04122 Dry eye syndrome of left lacrimal gland: Secondary | ICD-10-CM | POA: Diagnosis not present

## 2022-09-15 DIAGNOSIS — H532 Diplopia: Secondary | ICD-10-CM | POA: Diagnosis not present

## 2022-09-16 ENCOUNTER — Other Ambulatory Visit: Payer: Self-pay | Admitting: Internal Medicine

## 2022-09-16 DIAGNOSIS — J4489 Other specified chronic obstructive pulmonary disease: Secondary | ICD-10-CM

## 2022-09-30 ENCOUNTER — Ambulatory Visit: Payer: Medicare Other | Admitting: Hematology and Oncology

## 2022-10-04 ENCOUNTER — Other Ambulatory Visit: Payer: Self-pay | Admitting: Internal Medicine

## 2022-10-04 DIAGNOSIS — J4489 Other specified chronic obstructive pulmonary disease: Secondary | ICD-10-CM

## 2022-10-05 ENCOUNTER — Encounter: Payer: Self-pay | Admitting: Internal Medicine

## 2022-10-05 ENCOUNTER — Ambulatory Visit (INDEPENDENT_AMBULATORY_CARE_PROVIDER_SITE_OTHER): Payer: Medicare Other | Admitting: Internal Medicine

## 2022-10-05 VITALS — BP 160/80 | HR 73 | Temp 97.8°F | Ht 71.0 in | Wt 212.0 lb

## 2022-10-05 DIAGNOSIS — N3001 Acute cystitis with hematuria: Secondary | ICD-10-CM | POA: Diagnosis not present

## 2022-10-05 LAB — POCT URINALYSIS DIPSTICK
Bilirubin, UA: NEGATIVE
Blood, UA: POSITIVE
Glucose, UA: NEGATIVE
Ketones, UA: NEGATIVE
Nitrite, UA: NEGATIVE
Protein, UA: POSITIVE — AB
Spec Grav, UA: >=1.03 — AB (ref 1.010–1.025)
Urobilinogen, UA: 1 E.U./dL
pH, UA: 6 (ref 5.0–8.0)

## 2022-10-05 MED ORDER — NITROFURANTOIN MONOHYD MACRO 100 MG PO CAPS: 100.0000 mg | ORAL_CAPSULE | Freq: Two times a day (BID) | ORAL | 0 refills | Status: AC

## 2022-10-05 NOTE — Progress Notes (Signed)
   Subjective:   Patient ID: Laura Mendez, female    DOB: [birth year], 82 y.o.   MRN: [id number]  Urinary Tract Infection  Associated symptoms include hematuria. Pertinent negatives include no nausea or vomiting.   The patient is an 82 YO female coming in for blood in urine 1 day. Takes eliqius for past stroke and PAF. No pain on urination.   Review of Systems  Constitutional: Negative.   HENT: Negative.    Eyes: Negative.   Respiratory:  Negative for cough, chest tightness and shortness of breath.   Cardiovascular:  Negative for chest pain, palpitations and leg swelling.  Gastrointestinal:  Negative for abdominal distention, abdominal pain, constipation, diarrhea, nausea and vomiting.  Genitourinary:  Positive for hematuria.  Musculoskeletal: Negative.   Skin: Negative.   Neurological: Negative.   Psychiatric/Behavioral: Negative.      Objective:  Physical Exam Constitutional:      Appearance: She is well-developed.  HENT:     Head: Normocephalic and atraumatic.  Cardiovascular:     Rate and Rhythm: Normal rate and regular rhythm.  Pulmonary:     Effort: Pulmonary effort is normal. No respiratory distress.     Breath sounds: Normal breath sounds. No wheezing or rales.  Abdominal:     General: Bowel sounds are normal. There is no distension.     Palpations: Abdomen is soft.     Tenderness: There is no abdominal tenderness. There is no rebound.  Musculoskeletal:     Cervical back: Normal range of motion.  Skin:    General: Skin is warm and dry.     Findings: Rash present.  Neurological:     Mental Status: She is alert and oriented to person, place, and time.     Coordination: Coordination abnormal.     Comments: cane     Vitals:   10/05/22 1053 10/05/22 1055  BP: (!) 160/80 (!) 160/80  Pulse: 73   Temp: 97.8 F (36.6 C)   TempSrc: Oral   SpO2: 96%   Weight: 212 lb (96.2 kg)   Height: 5\' 11"  (1.803 m)     Assessment & Plan:

## 2022-10-05 NOTE — Patient Instructions (Signed)
We have sent in the antibiotic to take 1 pill twice a day for 5 days.

## 2022-10-05 NOTE — Assessment & Plan Note (Signed)
POC U/A done consistent with cystitis with hematuria. We discussed how with eliquis this can be common. Rx macrobid 5 days and if not resolved may need urology referral. She was a former rare smoker but not significant exposure.

## 2022-10-07 ENCOUNTER — Other Ambulatory Visit: Payer: Self-pay | Admitting: Hematology and Oncology

## 2022-10-07 ENCOUNTER — Other Ambulatory Visit: Payer: Self-pay | Admitting: Internal Medicine

## 2022-10-19 ENCOUNTER — Inpatient Hospital Stay (HOSPITAL_COMMUNITY)
Admission: EM | Admit: 2022-10-19 | Discharge: 2022-10-27 | DRG: 445 | Disposition: A | Discharge status: Home Health | Payer: Medicare Other | Attending: Internal Medicine | Admitting: Internal Medicine

## 2022-10-19 ENCOUNTER — Emergency Department (HOSPITAL_COMMUNITY): Payer: Medicare Other

## 2022-10-19 ENCOUNTER — Other Ambulatory Visit: Payer: Self-pay

## 2022-10-19 ENCOUNTER — Encounter (HOSPITAL_COMMUNITY): Payer: Self-pay | Admitting: Pharmacy Technician

## 2022-10-19 DIAGNOSIS — R000 Tachycardia, unspecified: Secondary | ICD-10-CM | POA: Diagnosis not present

## 2022-10-19 DIAGNOSIS — K819 Cholecystitis, unspecified: Secondary | ICD-10-CM | POA: Diagnosis not present

## 2022-10-19 DIAGNOSIS — Z87891 Personal history of nicotine dependence: Secondary | ICD-10-CM | POA: Diagnosis not present

## 2022-10-19 DIAGNOSIS — Z8041 Family history of malignant neoplasm of ovary: Secondary | ICD-10-CM

## 2022-10-19 DIAGNOSIS — E871 Hypo-osmolality and hyponatremia: Secondary | ICD-10-CM | POA: Diagnosis not present

## 2022-10-19 DIAGNOSIS — Z825 Family history of asthma and other chronic lower respiratory diseases: Secondary | ICD-10-CM

## 2022-10-19 DIAGNOSIS — Z79899 Other long term (current) drug therapy: Secondary | ICD-10-CM

## 2022-10-19 DIAGNOSIS — Z886 Allergy status to analgesic agent status: Secondary | ICD-10-CM | POA: Diagnosis not present

## 2022-10-19 DIAGNOSIS — Z888 Allergy status to other drugs, medicaments and biological substances status: Secondary | ICD-10-CM | POA: Diagnosis not present

## 2022-10-19 DIAGNOSIS — Z885 Allergy status to narcotic agent status: Secondary | ICD-10-CM

## 2022-10-19 DIAGNOSIS — Z7951 Long term (current) use of inhaled steroids: Secondary | ICD-10-CM

## 2022-10-19 DIAGNOSIS — R197 Diarrhea, unspecified: Secondary | ICD-10-CM | POA: Diagnosis not present

## 2022-10-19 DIAGNOSIS — N179 Acute kidney failure, unspecified: Secondary | ICD-10-CM | POA: Diagnosis present

## 2022-10-19 DIAGNOSIS — I959 Hypotension, unspecified: Secondary | ICD-10-CM | POA: Diagnosis present

## 2022-10-19 DIAGNOSIS — R519 Headache, unspecified: Secondary | ICD-10-CM | POA: Diagnosis not present

## 2022-10-19 DIAGNOSIS — I639 Cerebral infarction, unspecified: Secondary | ICD-10-CM | POA: Diagnosis present

## 2022-10-19 DIAGNOSIS — I4892 Unspecified atrial flutter: Secondary | ICD-10-CM | POA: Diagnosis not present

## 2022-10-19 DIAGNOSIS — Z79811 Long term (current) use of aromatase inhibitors: Secondary | ICD-10-CM

## 2022-10-19 DIAGNOSIS — I5032 Chronic diastolic (congestive) heart failure: Secondary | ICD-10-CM | POA: Diagnosis not present

## 2022-10-19 DIAGNOSIS — L209 Atopic dermatitis, unspecified: Secondary | ICD-10-CM | POA: Diagnosis present

## 2022-10-19 DIAGNOSIS — Z820 Family history of epilepsy and other diseases of the nervous system: Secondary | ICD-10-CM

## 2022-10-19 DIAGNOSIS — Z90710 Acquired absence of both cervix and uterus: Secondary | ICD-10-CM

## 2022-10-19 DIAGNOSIS — Z800 Family history of malignant neoplasm of digestive organs: Secondary | ICD-10-CM

## 2022-10-19 DIAGNOSIS — I69318 Other symptoms and signs involving cognitive functions following cerebral infarction: Secondary | ICD-10-CM

## 2022-10-19 DIAGNOSIS — A419 Sepsis, unspecified organism: Secondary | ICD-10-CM | POA: Diagnosis not present

## 2022-10-19 DIAGNOSIS — N182 Chronic kidney disease, stage 2 (mild): Secondary | ICD-10-CM | POA: Diagnosis present

## 2022-10-19 DIAGNOSIS — E872 Acidosis, unspecified: Secondary | ICD-10-CM | POA: Diagnosis present

## 2022-10-19 DIAGNOSIS — I4891 Unspecified atrial fibrillation: Principal | ICD-10-CM

## 2022-10-19 DIAGNOSIS — I4819 Other persistent atrial fibrillation: Secondary | ICD-10-CM | POA: Diagnosis present

## 2022-10-19 DIAGNOSIS — F03A0 Unspecified dementia, mild, without behavioral disturbance, psychotic disturbance, mood disturbance, and anxiety: Secondary | ICD-10-CM | POA: Diagnosis present

## 2022-10-19 DIAGNOSIS — K8000 Calculus of gallbladder with acute cholecystitis without obstruction: Secondary | ICD-10-CM | POA: Diagnosis not present

## 2022-10-19 DIAGNOSIS — J4489 Other specified chronic obstructive pulmonary disease: Secondary | ICD-10-CM | POA: Diagnosis present

## 2022-10-19 DIAGNOSIS — I9589 Other hypotension: Secondary | ICD-10-CM | POA: Diagnosis not present

## 2022-10-19 DIAGNOSIS — K828 Other specified diseases of gallbladder: Secondary | ICD-10-CM | POA: Diagnosis not present

## 2022-10-19 DIAGNOSIS — Z7901 Long term (current) use of anticoagulants: Secondary | ICD-10-CM

## 2022-10-19 DIAGNOSIS — C50511 Malignant neoplasm of lower-outer quadrant of right female breast: Secondary | ICD-10-CM

## 2022-10-19 DIAGNOSIS — I480 Paroxysmal atrial fibrillation: Secondary | ICD-10-CM | POA: Diagnosis present

## 2022-10-19 DIAGNOSIS — E860 Dehydration: Secondary | ICD-10-CM | POA: Diagnosis not present

## 2022-10-19 DIAGNOSIS — B9689 Other specified bacterial agents as the cause of diseases classified elsewhere: Secondary | ICD-10-CM | POA: Diagnosis not present

## 2022-10-19 DIAGNOSIS — Z8249 Family history of ischemic heart disease and other diseases of the circulatory system: Secondary | ICD-10-CM

## 2022-10-19 DIAGNOSIS — E876 Hypokalemia: Secondary | ICD-10-CM | POA: Diagnosis not present

## 2022-10-19 DIAGNOSIS — Z881 Allergy status to other antibiotic agents status: Secondary | ICD-10-CM

## 2022-10-19 DIAGNOSIS — R6510 Systemic inflammatory response syndrome (SIRS) of non-infectious origin without acute organ dysfunction: Secondary | ICD-10-CM | POA: Diagnosis present

## 2022-10-19 DIAGNOSIS — K869 Disease of pancreas, unspecified: Secondary | ICD-10-CM | POA: Diagnosis present

## 2022-10-19 DIAGNOSIS — Z8051 Family history of malignant neoplasm of kidney: Secondary | ICD-10-CM

## 2022-10-19 DIAGNOSIS — E785 Hyperlipidemia, unspecified: Secondary | ICD-10-CM | POA: Diagnosis not present

## 2022-10-19 DIAGNOSIS — Z808 Family history of malignant neoplasm of other organs or systems: Secondary | ICD-10-CM

## 2022-10-19 DIAGNOSIS — R5381 Other malaise: Secondary | ICD-10-CM | POA: Diagnosis present

## 2022-10-19 DIAGNOSIS — Z801 Family history of malignant neoplasm of trachea, bronchus and lung: Secondary | ICD-10-CM

## 2022-10-19 DIAGNOSIS — Z853 Personal history of malignant neoplasm of breast: Secondary | ICD-10-CM

## 2022-10-19 DIAGNOSIS — K810 Acute cholecystitis: Principal | ICD-10-CM | POA: Diagnosis present

## 2022-10-19 DIAGNOSIS — K8020 Calculus of gallbladder without cholecystitis without obstruction: Secondary | ICD-10-CM | POA: Diagnosis not present

## 2022-10-19 DIAGNOSIS — I69351 Hemiplegia and hemiparesis following cerebral infarction affecting right dominant side: Secondary | ICD-10-CM | POA: Diagnosis not present

## 2022-10-19 DIAGNOSIS — N200 Calculus of kidney: Secondary | ICD-10-CM | POA: Diagnosis not present

## 2022-10-19 DIAGNOSIS — I6381 Other cerebral infarction due to occlusion or stenosis of small artery: Secondary | ICD-10-CM | POA: Diagnosis not present

## 2022-10-19 LAB — CBC WITH DIFFERENTIAL/PLATELET
Abs Immature Granulocytes: 0.14 10*3/uL — ABNORMAL HIGH (ref 0.00–0.07)
Basophils Absolute: 0.1 10*3/uL (ref 0.0–0.1)
Basophils Relative: 0 %
Eosinophils Absolute: 0 10*3/uL (ref 0.0–0.5)
Eosinophils Relative: 0 %
HCT: 45.9 % (ref 36.0–46.0)
Hemoglobin: 14.6 g/dL (ref 12.0–15.0)
Immature Granulocytes: 1 %
Lymphocytes Relative: 6 %
Lymphs Abs: 1.2 10*3/uL (ref 0.7–4.0)
MCH: 28.6 pg (ref 26.0–34.0)
MCHC: 31.8 g/dL (ref 30.0–36.0)
MCV: 89.8 fL (ref 80.0–100.0)
Monocytes Absolute: 1.9 10*3/uL — ABNORMAL HIGH (ref 0.1–1.0)
Monocytes Relative: 9 %
Neutro Abs: 18.3 10*3/uL — ABNORMAL HIGH (ref 1.7–7.7)
Neutrophils Relative %: 84 %
Platelets: 259 10*3/uL (ref 150–400)
RBC: 5.11 MIL/uL (ref 3.87–5.11)
RDW: 13.8 % (ref 11.5–15.5)
WBC: 21.6 10*3/uL — ABNORMAL HIGH (ref 4.0–10.5)
nRBC: 0 % (ref 0.0–0.2)

## 2022-10-19 LAB — I-STAT CHEM 8, ED
BUN: 26 mg/dL — ABNORMAL HIGH (ref 8–23)
Calcium, Ion: 1.02 mmol/L — ABNORMAL LOW (ref 1.15–1.40)
Chloride: 100 mmol/L (ref 98–111)
Creatinine, Ser: 1.5 mg/dL — ABNORMAL HIGH (ref 0.44–1.00)
Glucose, Bld: 152 mg/dL — ABNORMAL HIGH (ref 70–99)
HCT: 48 % — ABNORMAL HIGH (ref 36.0–46.0)
Hemoglobin: 16.3 g/dL — ABNORMAL HIGH (ref 12.0–15.0)
Potassium: 3.3 mmol/L — ABNORMAL LOW (ref 3.5–5.1)
Sodium: 134 mmol/L — ABNORMAL LOW (ref 135–145)
TCO2: 20 mmol/L — ABNORMAL LOW (ref 22–32)

## 2022-10-19 LAB — LACTIC ACID, PLASMA
Lactic Acid, Venous: 3.4 mmol/L (ref 0.5–1.9)
Lactic Acid, Venous: 1.5 mmol/L (ref 0.5–1.9)

## 2022-10-19 LAB — URINALYSIS, ROUTINE W REFLEX MICROSCOPIC
Bilirubin Urine: NEGATIVE
Glucose, UA: NEGATIVE mg/dL
Ketones, ur: 20 mg/dL — AB
Nitrite: NEGATIVE
Protein, ur: 100 mg/dL — AB
Specific Gravity, Urine: >1.046 — ABNORMAL HIGH (ref 1.005–1.030)
pH: 6 (ref 5.0–8.0)

## 2022-10-19 LAB — URINALYSIS, MICROSCOPIC (REFLEX)

## 2022-10-19 LAB — COMPREHENSIVE METABOLIC PANEL
ALT: 20 U/L (ref 0–44)
AST: 36 U/L (ref 15–41)
Albumin: 3.1 g/dL — ABNORMAL LOW (ref 3.5–5.0)
Alkaline Phosphatase: 77 U/L (ref 38–126)
Anion gap: 16 — ABNORMAL HIGH (ref 5–15)
BUN: 23 mg/dL (ref 8–23)
CO2: 18 mmol/L — ABNORMAL LOW (ref 22–32)
Calcium: 8.8 mg/dL — ABNORMAL LOW (ref 8.9–10.3)
Chloride: 97 mmol/L — ABNORMAL LOW (ref 98–111)
Creatinine, Ser: 1.64 mg/dL — ABNORMAL HIGH (ref 0.44–1.00)
GFR, Estimated: 31 mL/min — ABNORMAL LOW (ref >60)
Glucose, Bld: 152 mg/dL — ABNORMAL HIGH (ref 70–99)
Potassium: 3.3 mmol/L — ABNORMAL LOW (ref 3.5–5.1)
Sodium: 131 mmol/L — ABNORMAL LOW (ref 135–145)
Total Bilirubin: 1.2 mg/dL (ref 0.3–1.2)
Total Protein: 7 g/dL (ref 6.5–8.1)

## 2022-10-19 LAB — PROTIME-INR
INR: 2 — ABNORMAL HIGH (ref 0.8–1.2)
Prothrombin Time: 22.6 seconds — ABNORMAL HIGH (ref 11.4–15.2)

## 2022-10-19 MED ORDER — LACTATED RINGERS IV SOLN: INTRAVENOUS | Status: DC

## 2022-10-19 MED ORDER — SODIUM CHLORIDE 0.9% FLUSH
3.0000 mL | Freq: Two times a day (BID) | INTRAVENOUS | Status: DC
  Administered 2022-10-20 – 2022-10-27 (×9): 3 mL via INTRAVENOUS

## 2022-10-19 MED ORDER — AMIODARONE HCL IN DEXTROSE 360-4.14 MG/200ML-% IV SOLN
60.0000 mg/h | INTRAVENOUS | Status: AC
  Administered 2022-10-19 (×2): 60 mg/h via INTRAVENOUS
  Filled 2022-10-19: qty 200

## 2022-10-19 MED ORDER — ALBUTEROL SULFATE (2.5 MG/3ML) 0.083% IN NEBU
3.0000 mL | INHALATION_SOLUTION | RESPIRATORY_TRACT | Status: DC | PRN
  Administered 2022-10-20 – 2022-10-23 (×2): 3 mL via RESPIRATORY_TRACT
  Filled 2022-10-19 (×2): qty 3

## 2022-10-19 MED ORDER — LACTATED RINGERS IV BOLUS (SEPSIS)
1000.0000 mL | Freq: Once | INTRAVENOUS | Status: AC
  Administered 2022-10-19: 1000 mL via INTRAVENOUS

## 2022-10-19 MED ORDER — ALBUTEROL SULFATE (2.5 MG/3ML) 0.083% IN NEBU
3.0000 mL | INHALATION_SOLUTION | RESPIRATORY_TRACT | Status: DC | PRN
  Administered 2022-10-19: 3 mL via RESPIRATORY_TRACT
  Filled 2022-10-19: qty 3

## 2022-10-19 MED ORDER — SODIUM CHLORIDE 0.9 % IV SOLN: INTRAVENOUS | Status: DC

## 2022-10-19 MED ORDER — PIPERACILLIN-TAZOBACTAM 3.375 G IVPB
3.3750 g | Freq: Three times a day (TID) | INTRAVENOUS | Status: DC
  Administered 2022-10-19 – 2022-10-24 (×14): 3.375 g via INTRAVENOUS
  Filled 2022-10-19 (×14): qty 50

## 2022-10-19 MED ORDER — HEPARIN (PORCINE) 25000 UT/250ML-% IV SOLN
1500.0000 [IU]/h | INTRAVENOUS | Status: DC
  Administered 2022-10-19 – 2022-10-20 (×3): 1300 [IU]/h via INTRAVENOUS
  Administered 2022-10-21 – 2022-10-22 (×2): 1500 [IU]/h via INTRAVENOUS
  Filled 2022-10-19 (×4): qty 250

## 2022-10-19 MED ORDER — ACETAMINOPHEN 325 MG PO TABS
650.0000 mg | ORAL_TABLET | Freq: Four times a day (QID) | ORAL | Status: DC | PRN
  Administered 2022-10-20 – 2022-10-22 (×3): 650 mg via ORAL
  Filled 2022-10-19 (×3): qty 2

## 2022-10-19 MED ORDER — IOHEXOL 350 MG/ML SOLN
70.0000 mL | Freq: Once | INTRAVENOUS | Status: AC | PRN
  Administered 2022-10-19: 70 mL via INTRAVENOUS

## 2022-10-19 MED ORDER — PIPERACILLIN-TAZOBACTAM 3.375 G IVPB 30 MIN: 3.3750 g | Freq: Three times a day (TID) | INTRAVENOUS | Status: DC

## 2022-10-19 MED ORDER — ONDANSETRON HCL 4 MG PO TABS
4.0000 mg | ORAL_TABLET | Freq: Four times a day (QID) | ORAL | Status: DC | PRN
  Administered 2022-10-19 – 2022-10-21 (×4): 4 mg via ORAL
  Filled 2022-10-19 (×4): qty 1

## 2022-10-19 MED ORDER — PIPERACILLIN-TAZOBACTAM 3.375 G IVPB 30 MIN
3.3750 g | Freq: Once | INTRAVENOUS | Status: AC
  Administered 2022-10-19: 3.375 g via INTRAVENOUS
  Filled 2022-10-19: qty 50

## 2022-10-19 MED ORDER — LACTATED RINGERS IV BOLUS
1000.0000 mL | Freq: Once | INTRAVENOUS | Status: AC
  Administered 2022-10-19: 1000 mL via INTRAVENOUS

## 2022-10-19 MED ORDER — HYDRALAZINE HCL 20 MG/ML IJ SOLN: 5.0000 mg | INTRAMUSCULAR | Status: DC | PRN

## 2022-10-19 MED ORDER — ACETAMINOPHEN 650 MG RE SUPP: 650.0000 mg | Freq: Four times a day (QID) | RECTAL | Status: DC | PRN

## 2022-10-19 MED ORDER — AMIODARONE HCL IN DEXTROSE 360-4.14 MG/200ML-% IV SOLN
30.0000 mg/h | INTRAVENOUS | Status: DC
  Administered 2022-10-19 – 2022-10-20 (×2): 30 mg/h via INTRAVENOUS
  Filled 2022-10-19 (×3): qty 200

## 2022-10-19 MED ORDER — ONDANSETRON HCL 4 MG/2ML IJ SOLN: 4.0000 mg | Freq: Four times a day (QID) | INTRAMUSCULAR | Status: DC | PRN

## 2022-10-19 MED ORDER — HEPARIN (PORCINE) 25000 UT/250ML-% IV SOLN: 1300.0000 [IU]/h | INTRAVENOUS | Status: DC

## 2022-10-19 MED ORDER — MOMETASONE FURO-FORMOTEROL FUM 200-5 MCG/ACT IN AERO
2.0000 | INHALATION_SPRAY | Freq: Two times a day (BID) | RESPIRATORY_TRACT | Status: DC
  Administered 2022-10-20 – 2022-10-27 (×15): 2 via RESPIRATORY_TRACT
  Filled 2022-10-19: qty 8.8

## 2022-10-19 MED ORDER — AMIODARONE LOAD VIA INFUSION
150.0000 mg | Freq: Once | INTRAVENOUS | Status: AC
  Administered 2022-10-19: 150 mg via INTRAVENOUS
  Filled 2022-10-19: qty 83.34

## 2022-10-19 NOTE — ED Notes (Signed)
MD notified about pts HR  

## 2022-10-19 NOTE — Sepsis Progress Note (Signed)
Confirmed with the bedside RN [place] that blood cultures were drawn prior to giving the antibiotic.

## 2022-10-19 NOTE — ED Notes (Signed)
ED TO INPATIENT HANDOFF REPORT  ED Nurse Name and Phone #: Tori [phone]  S Name/Age/Gender Laura Mendez 82 y.o. female Room/Bed: 028C/028C  Code Status   Code Status: Prior  Home/SNF/Other Home Patient oriented to: self, place, time, and situation Is this baseline? Yes   Triage Complete: Triage complete  Chief Complaint Acute cholecystitis [K81.0]  Triage Note Pt here with decreased PO intake along with diarrhea for the last 4 days. Pt arrives with dried stool to bil lower extremities. Family reports initially pt presented with NV.    Allergies Allergies  Allergen Reactions   Clarithromycin Anaphylaxis    "just about killed me"   Aspirin Hives   Ibuprofen Hives   Codeine Nausea And Vomiting   Codeine Phosphate Nausea And Vomiting   Diltiazem Rash   Methotrexate Other (See Comments)     tongue swelling and mouth soreness   Tape Itching and Other (See Comments)    Tears skin   Tapentadol Itching    Other reaction(s): Other (See Comments) Tears skin   Verapamil Hcl Er Rash    Level of Care/Admitting Diagnosis ED Disposition     ED Disposition  Admit   Condition  --   Comment  Hospital Area: [facility] [[id number]]  Level of Care: Progressive [102]  Admit to Progressive based on following criteria: CARDIOVASCULAR & THORACIC of moderate stability with acute coronary syndrome symptoms/low risk myocardial infarction/hypertensive urgency/arrhythmias/heart failure potentially compromising stability and stable post cardiovascular intervention patients.  Admit to Progressive based on following criteria: MULTISYSTEM THREATS such as stable sepsis, metabolic/electrolyte imbalance with or without encephalopathy that is responding to early treatment.  May admit patient to [facility] or [facility] if equivalent level of care is available:: No  Covid Evaluation: Asymptomatic - no recent exposure (last 10 days) testing not required  Diagnosis: Acute  cholecystitis [575.0.ICD-9-CM]  Admitting Physician: Jonah Blue [[id number]]  Attending Physician: Jonah Blue [[id number]]  Certification:: I certify this patient will need inpatient services for at least 2 midnights  Estimated Length of Stay: 3          B Medical/Surgery History Past Medical History:  Diagnosis Date   Asthma    Breast cancer (HCC)    Chronic airway obstruction, not elsewhere classified    Diastolic dysfunction without heart failure    Disorder of bone and cartilage, unspecified    Dysrhythmia    A-Fib   Family history of colon cancer    Family history of kidney cancer    Family history of ovarian cancer    Paroxysmal atrial flutter (HCC)    Stroke (HCC)    TIA   TIA (transient ischemic attack)    Unspecified asthma(493.90)    Past Surgical History:  Procedure Laterality Date   ABDOMINAL HYSTERECTOMY     BREAST LUMPECTOMY Right 12/30/2017   BREAST LUMPECTOMY WITH RADIOACTIVE SEED LOCALIZATION Right 12/30/2017   Procedure: BREAST LUMPECTOMY WITH RADIOACTIVE SEED LOCALIZATION X'S 3;  Surgeon: Emelia Loron, MD;  Location: [facility] OR;  Service: General;  Laterality: Right;   BUNIONECTOMY Bilateral    EYE SURGERY Bilateral    cataract removal   LEFT HEART CATH AND CORONARY ANGIOGRAPHY N/A 11/23/2021   Procedure: LEFT HEART CATH AND CORONARY ANGIOGRAPHY;  Surgeon: Tonny Bollman, MD;  Location: [facility] INVASIVE CV LAB;  Service: Cardiovascular;  Laterality: N/A;     A IV Location/Drains/Wounds Patient Lines/Drains/Airways Status     Active Line/Drains/Airways     Name Placement date Placement time Site  Days   Peripheral IV 10/19/22 20 G Anterior;Left Forearm 10/19/22  1131  Forearm  less than 1   Peripheral IV 10/19/22 20 G 1.88" Anterior;Right Forearm 10/19/22  1450  Forearm  less than 1   Wound / Incision (Open or Dehisced) 07/01/21 Knee Anterior;Left abrasion 1.3X1.5 07/01/21  1422  Knee  475   Wound / Incision (Open or Dehisced) 07/01/21 (MASD) Moisture  Associated Skin Damage Buttocks Right;Left 07/01/21  1430  Buttocks  475            Intake/Output Last 24 hours No intake or output data in the 24 hours ending 10/19/22 1533  Labs/Imaging Results for orders placed or performed during the hospital encounter of 10/19/22 (from the past 48 hour(s))  CBC with Differential     Status: Abnormal   Collection Time: 10/19/22 11:23 AM  Result Value Ref Range   WBC 21.6 (H) 4.0 - 10.5 K/uL   RBC 5.11 3.87 - 5.11 MIL/uL   Hemoglobin 14.6 12.0 - 15.0 g/dL   HCT 16.1 09.6 - 04.5 %   MCV 89.8 80.0 - 100.0 fL   MCH 28.6 26.0 - 34.0 pg   MCHC 31.8 30.0 - 36.0 g/dL   RDW 40.9 81.1 - 91.4 %   Platelets 259 150 - 400 K/uL   nRBC 0.0 0.0 - 0.2 %   Neutrophils Relative % 84 %   Neutro Abs 18.3 (H) 1.7 - 7.7 K/uL   Lymphocytes Relative 6 %   Lymphs Abs 1.2 0.7 - 4.0 K/uL   Monocytes Relative 9 %   Monocytes Absolute 1.9 (H) 0.1 - 1.0 K/uL   Eosinophils Relative 0 %   Eosinophils Absolute 0.0 0.0 - 0.5 K/uL   Basophils Relative 0 %   Basophils Absolute 0.1 0.0 - 0.1 K/uL   Immature Granulocytes 1 %   Abs Immature Granulocytes 0.14 (H) 0.00 - 0.07 K/uL    Comment: Performed at [facility], [address], [place], [place] [address]  Comprehensive metabolic panel     Status: Abnormal   Collection Time: 10/19/22 11:23 AM  Result Value Ref Range   Sodium 131 (L) 135 - 145 mmol/L   Potassium 3.3 (L) 3.5 - 5.1 mmol/L   Chloride 97 (L) 98 - 111 mmol/L   CO2 18 (L) 22 - 32 mmol/L   Glucose, Bld 152 (H) 70 - 99 mg/dL    Comment: Glucose reference range applies only to samples taken after fasting for at least 8 hours.   BUN 23 8 - 23 mg/dL   Creatinine, Ser 6.21 (H) 0.44 - 1.00 mg/dL   Calcium 8.8 (L) 8.9 - 10.3 mg/dL   Total Protein 7.0 6.5 - 8.1 g/dL   Albumin 3.1 (L) 3.5 - 5.0 g/dL   AST 36 15 - 41 U/L   ALT 20 0 - 44 U/L   Alkaline Phosphatase 77 38 - 126 U/L   Total Bilirubin 1.2 0.3 - 1.2 mg/dL   GFR, Estimated 31 (L) >60 mL/min     Comment: (NOTE) Calculated using the CKD-EPI Creatinine Equation (2021)    Anion gap 16 (H) 5 - 15    Comment: Performed at [facility], [address], [place], [place] [address]  Protime-INR     Status: Abnormal   Collection Time: 10/19/22 11:23 AM  Result Value Ref Range   Prothrombin Time 22.6 (H) 11.4 - 15.2 seconds   INR 2.0 (H) 0.8 - 1.2    Comment: (NOTE)  INR goal varies based on device and disease states. Performed at [facility], [address], [place], [place] [address]   I-stat chem 8, ED (not at [facility], [facility] or [facility])     Status: Abnormal   Collection Time: 10/19/22 11:29 AM  Result Value Ref Range   Sodium 134 (L) 135 - 145 mmol/L   Potassium 3.3 (L) 3.5 - 5.1 mmol/L   Chloride 100 98 - 111 mmol/L   BUN 26 (H) 8 - 23 mg/dL   Creatinine, Ser 6.04 (H) 0.44 - 1.00 mg/dL   Glucose, Bld 540 (H) 70 - 99 mg/dL    Comment: Glucose reference range applies only to samples taken after fasting for at least 8 hours.   Calcium, Ion 1.02 (L) 1.15 - 1.40 mmol/L   TCO2 20 (L) 22 - 32 mmol/L   Hemoglobin 16.3 (H) 12.0 - 15.0 g/dL   HCT 98.1 (H) 19.1 - 47.8 %  Urinalysis, Routine w reflex microscopic -Urine, Catheterized     Status: Abnormal   Collection Time: 10/19/22  1:45 PM  Result Value Ref Range   Color, Urine YELLOW YELLOW    Comment: LESS THAN 10 mL OF URINE SUBMITTED   APPearance HAZY (A) CLEAR   Specific Gravity, Urine >1.046 (H) 1.005 - 1.030   pH 6.0 5.0 - 8.0   Glucose, UA NEGATIVE NEGATIVE mg/dL   Hgb urine dipstick MODERATE (A) NEGATIVE   Bilirubin Urine NEGATIVE NEGATIVE   Ketones, ur 20 (A) NEGATIVE mg/dL   Protein, ur 295 (A) NEGATIVE mg/dL   Nitrite NEGATIVE NEGATIVE   Leukocytes,Ua LARGE (A) NEGATIVE    Comment: Performed at [facility], [address], [place], [place] [address]  Urinalysis, Microscopic (reflex)     Status: Abnormal   Collection Time: 10/19/22  1:45 PM  Result Value Ref Range   RBC / HPF 11-20 0 - 5 RBC/hpf   WBC, UA  6-10 0 - 5 WBC/hpf   Bacteria, UA FEW (A) NONE SEEN   Squamous Epithelial / HPF 0-5 0 - 5 /HPF    Comment: Performed at [facility], [address], [place], [place] [address]   CT ABDOMEN PELVIS W CONTRAST  Result Date: 10/19/2022 CLINICAL DATA:  Abdominal pain EXAM: CT ABDOMEN AND PELVIS WITH CONTRAST TECHNIQUE: Multidetector CT imaging of the abdomen and pelvis was performed using the standard protocol following bolus administration of intravenous contrast. RADIATION DOSE REDUCTION: This exam was performed according to the departmental dose-optimization program which includes automated exposure control, adjustment of the mA and/or kV according to patient size and/or use of iterative reconstruction technique. CONTRAST:  70mL OMNIPAQUE IOHEXOL 350 MG/ML SOLN COMPARISON:  None Available. FINDINGS: Lower chest: Heart is slightly enlarged. Significant calcifications along the mitral valve annulus. There is some linear opacity at the bases likely scar or atelectasis. 6 mm subpleural nodule left lower lobe on series 4, image 3. Small hiatal hernia. Hepatobiliary: Gallbladder is dilated with wall thickening, stones and stranding. Please correlate for acute cholecystitis. There may be a stone towards the neck as well. Underlying fatty liver infiltration. Patent portal vein. Pancreas: Severe fatty atrophy of the pancreas. There is a cystic lesion towards the tail of the pancreas measuring 2.7 by 2.5 cm. Series 3, image 27. Spleen: Normal in size without focal abnormality. Adrenals/Urinary Tract: The adrenal glands are preserved. Bilateral small renal stones. These punctate overall except for 4 mm lower pole right-sided stone. There is cyst along the lower pole  left kidney measuring 2.2 cm with Hounsfield unit of 11. Bosniak 1 lesion. Tiny focus on the right, Bosniak 2 lesion. Again no specific imaging follow-up of the cysts. The ureters have normal course and caliber extending down to the bladder. Bladder is  underdistended. Stomach/Bowel: There is some fluid in the stomach. Stomach is nondilated. The small bowel is nondilated. Large bowel has a normal course and caliber. Normal appendix. Vascular/Lymphatic: Normal caliber aorta and IVC. Mild vascular calcifications. No specific abnormal lymph node enlargement identified in the abdomen and pelvis. Replaced left hepatic artery from the left gastric. Reproductive: Status post hysterectomy. No adnexal masses. Other: Small amount of free fluid dependently in the pelvis, nonspecific. No free air. Fat containing umbilical hernia. Musculoskeletal: Scattered degenerative changes along the spine with levoconvex curvature of the lower lumbar spine centered at L4. There is also multilevel trace retrolisthesis and multilevel stenosis. IMPRESSION: Abnormal gallbladder with dilatation and stones. There is significant wall thickening, edema and adjacent stranding consistent with a acute cholecystitis. Possible stone in the neck of the gallbladder as well. 2.7 cm cystic lesion along the tail of the pancreas. In the absence of prior would recommend follow up imaging in 6 months. Electronically Signed   By: Karen Kays M.D.   On: 10/19/2022 12:35   CT Head Wo Contrast  Result Date: 10/19/2022 CLINICAL DATA:  Headache, new onset. EXAM: CT HEAD WITHOUT CONTRAST TECHNIQUE: Contiguous axial images were obtained from the base of the skull through the vertex without intravenous contrast. RADIATION DOSE REDUCTION: This exam was performed according to the departmental dose-optimization program which includes automated exposure control, adjustment of the mA and/or kV according to patient size and/or use of iterative reconstruction technique. COMPARISON:  Head CT 06/28/2021.  MRI brain 06/28/2021. FINDINGS: Brain: No acute hemorrhage. Unchanged mild chronic small-vessel disease with old lacunar infarcts in the bilateral caudate heads. Cortical gray-white differentiation is otherwise preserved.  Prominence of the ventricles and sulci within expected range for age. No hydrocephalus or extra-axial collection. No mass effect or midline shift. Vascular: No hyperdense vessel or unexpected calcification. Skull: No calvarial fracture or suspicious bone lesion. Skull base is unremarkable. Sinuses/Orbits: Unremarkable. Other: None IMPRESSION: 1. No acute intracranial abnormality. 2. Unchanged mild chronic small-vessel disease. Electronically Signed   By: Orvan Falconer M.D.   On: 10/19/2022 12:31    Pending Labs Unresulted Labs (From admission, onward)     Start     Ordered   10/19/22 1330  Lactic acid, plasma  Once,   STAT        10/19/22 1330   10/19/22 1301  Lactic acid, plasma  Now then every 2 hours,   R (with STAT occurrences)      10/19/22 1300   10/19/22 1301  Blood culture (routine x 2)  BLOOD CULTURE X 2,   R (with STAT occurrences)      10/19/22 1300   10/19/22 1104  C Difficile Quick Screen w PCR reflex  (C Difficile quick screen w PCR reflex panel )  Once, for 24 hours,   URGENT       References:    CDiff Information Tool   10/19/22 1104            Vitals/Pain Today's Vitals   10/19/22 1310 10/19/22 1340 10/19/22 1455 10/19/22 1456  BP:  113/67 (!) 115/98   Pulse: 79 (!) 41 81   Resp: (!) 29 (!) 22 (!) 22   Temp:    98.3 F (36.8 C)  TempSrc:    Oral  SpO2: 92% 94% 92%   PainSc:        Isolation Precautions Enteric precautions (UV disinfection)  Medications Medications  lactated ringers infusion ( Intravenous Restarted 10/19/22 1456)  amiodarone (NEXTERONE) 1.8 mg/mL load via infusion 150 mg (150 mg Intravenous Bolus from Bag 10/19/22 1408)    Followed by  amiodarone (NEXTERONE PREMIX) 360-4.14 MG/200ML-% (1.8 mg/mL) IV infusion (60 mg/hr Intravenous New Bag/Given 10/19/22 1406)    Followed by  amiodarone (NEXTERONE PREMIX) 360-4.14 MG/200ML-% (1.8 mg/mL) IV infusion (has no administration in time range)  piperacillin-tazobactam (ZOSYN) IVPB 3.375 g (has no  administration in time range)  lactated ringers bolus 1,000 mL (0 mLs Intravenous Stopped 10/19/22 1311)  iohexol (OMNIPAQUE) 350 MG/ML injection 70 mL (70 mLs Intravenous Contrast Given 10/19/22 1218)  piperacillin-tazobactam (ZOSYN) IVPB 3.375 g (0 g Intravenous Stopped 10/19/22 1348)  lactated ringers bolus 1,000 mL ( Intravenous Restarted 10/19/22 1456)    Mobility walks with device     Focused Assessments Cardiac Assessment Handoff:    Lab Results  Component Value Date   CKTOTAL 236 (H) 06/29/2021   TROPONINI <0.03 12/11/2015   No results found for: "DDIMER" Does the Patient currently have chest pain? No    R Recommendations: See Admitting Provider Note  Report given to:   Additional Notes: Pt is on amio gtt, axox4, VSS

## 2022-10-19 NOTE — Consult Note (Addendum)
NAME:  Laura Mendez, MRN:  [id number], DOB:  [birth year], LOS: 0 ADMISSION DATE:  10/19/2022, CONSULTATION DATE:  10/19/22 REFERRING MD:  Dr. Rosalia Hammers , CHIEF COMPLAINT:  Hypotension/Afib with RVR    History of Present Illness:  Laura Mendez is an 82 year old female with past medical history of paroxysmal Afib on eliquis, Stage II CKD, and prior CVA who presents to the emergency department for a two day history of loose stools. She states she has not had any abdominal pain, nausea, vomiting, fevers, chills. She denies any sick contacts, or eating anything outside of her normal diet. Pt persistently hypotensive, and is getting 2L of LR currently. EKG findings also consistent with Afib w/ RVR, and pt remains tachycardic with pulses in 150-160 range during encounter. CT concerning for acute cholecystitis.   PCCM called to evaluate need for pressor support.   Pertinent  Medical History  Paroxysmal Afib on eliquis  Prior CVA  Significant Hospital Events: Including procedures, antibiotic start and stop dates in addition to other pertinent events     Interim History / Subjective:  Pt seen at bedside   Objective   Blood pressure 113/67, pulse (!) 41, temperature 98.7 F (37.1 C), temperature source Oral, resp. rate (!) 22, SpO2 94 %.       No intake or output data in the 24 hours ending 10/19/22 1406 There were no vitals filed for this visit.  Examination: General: Elderly female, in no acute distress HENT: Dry mucous membranes Lungs: Clear to auscultation bilaterally Cardiovascular: Irregular irregular rate and rhythm, no murmurs, rubs, or gallops heard Abdomen: No scars seen, Normoactive bowel sounds, non-tender to palpation, non-edematous  Extremities: NO lower extremity edema seen Neuro: AAOx4, able to move all extremities spontaneously GU: Not done  Resolved Hospital Problem list     Assessment & Plan:   Acute Cholecystitis  -CT abdomen concerning for acute cholecystitis   - WBC at 21.0 - LA pending -Since asymptomatic, surgery will treat conservatively  - Continue Zosyn  - Rest per surgery  - Last eliquis dosage was 8AM on 5/28 - F/U Blood cultures  Afib with RVR  - Pt with history of Afib, likely sent into RVR with acute GI infection, as well as cholecystitis - Blood pressure within normal limits s/p 2L LR Bolus  - Continues to be tachycardic  - Amiodarone drip - Do not believe she was on any rate control medications at home. Was previously on cardizem but stopped due to rash  - Did auto convert in 2017 with metoprolol 5mg  - CHADVASC score is 6  Anion-Gap Metabolic Acidosis  Hypokalemia  -AG mildly elevated at 16, likely in the setting of GI Losses from diarrhea over the last two days - Continue to monitor after fluid repletion, supplement as necessary  AKI 2/2 GI Losses - Cr at 1.64, baseline seems to be around .80  - Recommend rechecking in the AM after fluids  Hx of CVA  - Hold eliquis for pending surgery   Chronic Asthma  - Uses Albuterol and Advair at home, can restart in the inpatient setting  Overall, do not believe pt requires ICU admission as pressures are adequate and pt does not appear to be in acute distress.    Best Practice (right click and "Reselect all SmartList Selections" daily)   Diet/type: NPO DVT prophylaxis: SCD GI prophylaxis: N/A Lines: N/A Foley:  N/A Code Status:  Will need to obtain per primary Last date of multidisciplinary goals of care  discussion [Today]  Labs   CBC: Recent Labs  Lab 10/19/22 1123 10/19/22 1129  WBC 21.6*  --   NEUTROABS 18.3*  --   HGB 14.6 16.3*  HCT 45.9 48.0*  MCV 89.8  --   PLT 259  --     Basic Metabolic Panel: Recent Labs  Lab 10/19/22 1123 10/19/22 1129  NA 131* 134*  K 3.3* 3.3*  CL 97* 100  CO2 18*  --   GLUCOSE 152* 152*  BUN 23 26*  CREATININE 1.64* 1.50*  CALCIUM 8.8*  --    GFR: CrCl cannot be calculated (Unknown ideal weight.). Recent Labs  Lab  10/19/22 1123  WBC 21.6*    Liver Function Tests: Recent Labs  Lab 10/19/22 1123  AST 36  ALT 20  ALKPHOS 77  BILITOT 1.2  PROT 7.0  ALBUMIN 3.1*   No results for input(s): "LIPASE", "AMYLASE" in the last 168 hours. No results for input(s): "AMMONIA" in the last 168 hours.  ABG    Component Value Date/Time   TCO2 20 (L) 10/19/2022 1129     Coagulation Profile: Recent Labs  Lab 10/19/22 1123  INR 2.0*    Cardiac Enzymes: No results for input(s): "CKTOTAL", "CKMB", "CKMBINDEX", "TROPONINI" in the last 168 hours.  HbA1C: Hgb A1c MFr Bld  Date/Time Value Ref Range Status  11/23/2021 06:00 AM 5.9 (H) 4.8 - 5.6 % Final    Comment:    (NOTE) Pre diabetes:          5.7%-6.4%  Diabetes:              >6.4%  Glycemic control for   <7.0% adults with diabetes   06/29/2021 02:29 AM 5.1 4.8 - 5.6 % Final    Comment:    (NOTE) Pre diabetes:          5.7%-6.4%  Diabetes:              >6.4%  Glycemic control for   <7.0% adults with diabetes     CBG: No results for input(s): "GLUCAP" in the last 168 hours.  Review of Systems:   Negative except for what is stated in the HPI  Past Medical History:  She,  has a past medical history of Asthma, Breast cancer (HCC), Chronic airway obstruction, not elsewhere classified, Diastolic dysfunction without heart failure, Disorder of bone and cartilage, unspecified, Dysrhythmia, Family history of colon cancer, Family history of kidney cancer, Family history of ovarian cancer, Paroxysmal atrial flutter (HCC), Stroke (HCC), TIA (transient ischemic attack), and Unspecified asthma(493.90).   Surgical History:   Past Surgical History:  Procedure Laterality Date   ABDOMINAL HYSTERECTOMY     BREAST LUMPECTOMY Right 12/30/2017   BREAST LUMPECTOMY WITH RADIOACTIVE SEED LOCALIZATION Right 12/30/2017   Procedure: BREAST LUMPECTOMY WITH RADIOACTIVE SEED LOCALIZATION X'S 3;  Surgeon: Emelia Loron, MD;  [facility];  Service:  General;  Laterality: Right;   BUNIONECTOMY Bilateral    EYE SURGERY Bilateral    cataract removal   LEFT HEART CATH AND CORONARY ANGIOGRAPHY N/A 11/23/2021   Procedure: LEFT HEART CATH AND CORONARY ANGIOGRAPHY;  Surgeon: Tonny Bollman, MD;  Location: [facility] INVASIVE CV LAB;  Service: Cardiovascular;  Laterality: N/A;     Social History:   reports that she quit smoking about 45 years ago. Her smoking use included cigarettes. She has never used smokeless tobacco. She reports that she does not drink alcohol and does not use drugs.   Family History:  Her family history includes  COPD in her father; Cancer in an other family member; Cirrhosis in her sister; Colon cancer in her cousin, maternal uncle, and maternal uncle; Colon cancer (age of onset: 60) in her mother; Heart attack in her paternal uncle; Heart disease in her maternal grandfather; Kidney cancer (age of onset: 35) in her sister; Kidney cancer (age of onset: 25) in her brother; Lung cancer in her sister; Melanoma (age of onset: 24) in her sister; Ovarian cancer in her maternal aunt and maternal grandmother; Ovarian cancer (age of onset: 17) in her sister; Parkinson's disease in her brother; Rectal cancer in her paternal grandfather. There is no history of Breast cancer.   Allergies Allergies  Allergen Reactions   Clarithromycin Anaphylaxis    "just about killed me"   Aspirin Hives   Ibuprofen Hives   Codeine Nausea And Vomiting   Codeine Phosphate Nausea And Vomiting   Diltiazem Rash   Methotrexate Other (See Comments)     tongue swelling and mouth soreness   Tape Itching and Other (See Comments)    Tears skin   Tapentadol Itching    Other reaction(s): Other (See Comments) Tears skin   Verapamil Hcl Er Rash     Home Medications  Prior to Admission medications   Medication Sig Start Date End Date Taking? Authorizing Provider  acetaminophen (TYLENOL) 325 MG tablet Take 2 tablets (650 mg total) by mouth every 4 (four) hours as  needed for mild pain (or temp > 37.5 C (99.5 F)). 07/14/21   Angiulli, Mcarthur Rossetti, PA-C  albuterol (VENTOLIN HFA) 108 (90 Base) MCG/ACT inhaler INHALE 2 PUFFS BY MOUTH EVERY 4 HOURS AS NEEDED 10/06/22   Jetty Duhamel D, MD  ammonium lactate (LAC-HYDRIN) 12 % cream Apply 1 Application topically as needed for dry skin. 04/28/22   Ellsworth Lennox, PA-C  apixaban (ELIQUIS) 5 MG TABS tablet Take 1 tablet by mouth twice daily 07/26/22   Lanier Prude, MD  DUPIXENT 300 MG/2ML SOPN Inject 300 mg into the skin every 14 (fourteen) days. 10/30/21   [provider]  fluticasone-salmeterol (ADVAIR) 250-50 MCG/ACT AEPB INHALE 1 PUFF INTO LUNGS TWICE DAILY 05/20/22   Waymon Budge, MD  hydrOXYzine (ATARAX) 25 MG tablet Take 1 tablet (25 mg total) by mouth every 6 (six) hours. 03/10/22   Valinda Hoar, NP  letrozole [facility]) 2.5 MG tablet Take 1 tablet by mouth once daily 10/08/22   Serena Croissant, MD  Multiple Vitamin (MULTIVITAMIN) tablet Take 1 tablet by mouth daily.    [provider]  pantoprazole (PROTONIX) 40 MG tablet Take 1 tablet by mouth once daily 10/08/22   [name], MD  pimecrolimus (ELIDEL) 1 % cream Apply topically 2 (two) times daily. Do not occlude, do not use longer than 6 weeks 03/24/22   Verlon Setting, Whitney L, PA  predniSONE (DELTASONE) 10 MG tablet 1 daily or as directed 07/29/22   Jetty Duhamel D, MD  PRESCRIPTION MEDICATION Inhale 1 ampule into the lungs daily as needed (SOB). Nebulizer solution    [provider]  triamcinolone cream (KENALOG) 0.1 % Apply 1 Application topically 2 (two) times daily. 04/28/22   Ellsworth Lennox, PA-C     Critical care time: 40 minutes    Olegario Messier, MD Internal Medicine Resident PGY-1 Pager: [phone]

## 2022-10-19 NOTE — ED Notes (Signed)
Patient transported to CT 

## 2022-10-19 NOTE — Plan of Care (Signed)
  Problem: Education: Goal: Knowledge of disease or condition will improve Outcome: Progressing   

## 2022-10-19 NOTE — Sepsis Progress Note (Signed)
eLink is following this Code Sepsis. °

## 2022-10-19 NOTE — Sepsis Progress Note (Signed)
1501 Secure chat to ED RN Benetta Spar to inquire if lactic acid was sent 1508 [place] RN rent secure chat to this RN that she was drawing the lactic at that time.  1522 Epic note by [place] RN that lactic acid was sent.  1637 Tx to [id number] Noted order at 1553 to discontinue order at transfer. Requested provider to reorder for sepsis bundle.

## 2022-10-19 NOTE — ED Triage Notes (Signed)
Pt here with decreased PO intake along with diarrhea for the last 4 days. Pt arrives with dried stool to bil lower extremities. Family reports initially pt presented with NV.

## 2022-10-19 NOTE — ED Notes (Signed)
Repeat lactic sent 

## 2022-10-19 NOTE — Progress Notes (Signed)
ANTICOAGULATION CONSULT NOTE - Initial Consult  Pharmacy Consult for heparin Indication: atrial fibrillation  Allergies  Allergen Reactions   Clarithromycin Anaphylaxis    "just about killed me"   Aspirin Hives   Ibuprofen Hives   Codeine Nausea And Vomiting   Codeine Phosphate Nausea And Vomiting   Diltiazem Rash   Methotrexate Other (See Comments)     tongue swelling and mouth soreness   Tape Itching and Other (See Comments)    Tears skin   Tapentadol Itching    Other reaction(s): Other (See Comments) Tears skin   Verapamil Hcl Er Rash    Patient Measurements:   Heparin Dosing Weight: 90 kg  Vital Signs: Temp: 98.3 F (36.8 C) (05/28 1456) Temp Source: Oral (05/28 1456) BP: 107/69 (05/28 1500) Pulse Rate: 67 (05/28 1515)  Labs: Recent Labs    10/19/22 1123 10/19/22 1129  HGB 14.6 16.3*  HCT 45.9 48.0*  PLT 259  --   LABPROT 22.6*  --   INR 2.0*  --   CREATININE 1.64* 1.50*    CrCl cannot be calculated (Unknown ideal weight.).   Medical History: Past Medical History:  Diagnosis Date   Asthma    Breast cancer (HCC)    Chronic airway obstruction, not elsewhere classified    Diastolic dysfunction without heart failure    Disorder of bone and cartilage, unspecified    Dysrhythmia    A-Fib   Family history of colon cancer    Family history of kidney cancer    Family history of ovarian cancer    Paroxysmal atrial flutter (HCC)    Stroke (HCC)    TIA   TIA (transient ischemic attack)    Unspecified asthma(493.90)     Medications:  (Not in a hospital admission)  Scheduled:   mepolizumab  100 mg Subcutaneous Q28 days   sodium chloride flush  3 mL Intravenous Q12H   Infusions:   amiodarone 60 mg/hr (10/19/22 1406)   Followed by   amiodarone     piperacillin-tazobactam (ZOSYN)  IV      Assessment: Patient presented with decreased PO intake and diarrhea x 4 days. Patient has a history of atrial fibrillation and CVA in 2023 on Eliquis 5 mg BID  PTA. Last dose of Eliquis was 5/28 AM. Hgb 16.3, HCT 48, and PLTc 259K on admission. Will dose based on aPTT until anti-Xa levels correlate.  Goal of Therapy:  aPTT 66-102 seconds Monitor platelets by anticoagulation protocol: Yes   Plan:  Omit bolus with recent DOAC use Start heparin at 1300 units/hr to start at 2100 (~12 hours from last Eliquis dose) Daily heparin level, and aPTT, first with AM labs  Thank you for involving pharmacy in this patient's care.  Enos Fling, PharmD PGY2 Pharmacy Resident 10/19/2022 4:25 PM

## 2022-10-19 NOTE — Consult Note (Signed)
Consult Note  Laura Mendez July 20, 1940  [id number].    Requesting MD: Margarita Grizzle, MD Chief Complaint/Reason for Consult: possible cholecystitis  HPI:  Patient is an 82 year old female who presented to the ED with 3-4 days of diarrhea. She was feeling increasingly weak and son went to check on her because she had not been eating well. She has been having multiple loose stools and reports these are somewhat dark and that she has had bright red blood in the toilet as well. No hx of hemorrhoidal bleeding. She denies abdominal pain, nausea or vomiting for me. She has not been febrile and denies sick contacts or suspicious food intake. She has had prior hysterectomy but thinks this may have been done vaginally. PMH otherwise significant for breast cancer s/p lumpectomy, Atrial fibrillation on eliquis (last dose this AM), CHF, HLD, hx of CVA with right sided weakness. Workup showed A.Fib with RVR and some hypotension with concern for possible cholecystitis. Patient said she has never had gallbladder issues previously that she has known about.    ROS: Negative other than HPI  Family History  Problem Relation Age of Onset   COPD Father    Colon cancer Mother 10       mets to pancreas and liver   Kidney cancer Sister 25       d. 60   Lung cancer Sister        d. 23, smoker   Ovarian cancer Sister 66       d. 20   Cirrhosis Sister        d. 44   Melanoma Sister 23   Ovarian cancer Maternal Grandmother        d. 40   Heart disease Maternal Grandfather        rheumatic heart disease   Rectal cancer Paternal Grandfather        d. 110   Parkinson's disease Brother    Ovarian cancer Maternal Aunt    Colon cancer Maternal Uncle        d. 72-73   Heart attack Paternal Uncle        d. 84   Kidney cancer Brother 74   Colon cancer Maternal Uncle        d. 103   Cancer Other        MGMs pat 1/2 sister with uterine and rectal cancer   Colon cancer Cousin        d. 45 - mat  first cousin   Breast cancer Neg Hx     Past Medical History:  Diagnosis Date   Asthma    Breast cancer (HCC)    Chronic airway obstruction, not elsewhere classified    Diastolic dysfunction without heart failure    Disorder of bone and cartilage, unspecified    Dysrhythmia    A-Fib   Family history of colon cancer    Family history of kidney cancer    Family history of ovarian cancer    Paroxysmal atrial flutter (HCC)    Stroke (HCC)    TIA   TIA (transient ischemic attack)    Unspecified asthma(493.90)     Past Surgical History:  Procedure Laterality Date   ABDOMINAL HYSTERECTOMY     BREAST LUMPECTOMY Right 12/30/2017   BREAST LUMPECTOMY WITH RADIOACTIVE SEED LOCALIZATION Right 12/30/2017   Procedure: BREAST LUMPECTOMY WITH RADIOACTIVE SEED LOCALIZATION X'S 3;  Surgeon: Emelia Loron, MD;  Location: [facility] OR;  Service: General;  Laterality:  Right;   BUNIONECTOMY Bilateral    EYE SURGERY Bilateral    cataract removal   LEFT HEART CATH AND CORONARY ANGIOGRAPHY N/A 11/23/2021   Procedure: LEFT HEART CATH AND CORONARY ANGIOGRAPHY;  Surgeon: Tonny Bollman, MD;  Location: [facility] INVASIVE CV LAB;  Service: Cardiovascular;  Laterality: N/A;    Social History:  reports that she quit smoking about 45 years ago. Her smoking use included cigarettes. She has never used smokeless tobacco. She reports that she does not drink alcohol and does not use drugs.  Allergies:  Allergies  Allergen Reactions   Clarithromycin Anaphylaxis    "just about killed me"   Aspirin Hives   Ibuprofen Hives   Codeine Nausea And Vomiting   Codeine Phosphate Nausea And Vomiting   Diltiazem Rash   Methotrexate Other (See Comments)     tongue swelling and mouth soreness   Tape Itching and Other (See Comments)    Tears skin   Tapentadol Itching    Other reaction(s): Other (See Comments) Tears skin   Verapamil Hcl Er Rash    (Not in a hospital admission)   Blood pressure 113/67, pulse (!) 41,  temperature 98.7 F (37.1 C), temperature source Oral, resp. rate (!) 22, SpO2 94 %. Physical Exam:  General: pleasant, WD, elderly female who is laying in bed in NAD HEENT: sclera anicteric  Heart: regular, rate, and rhythm. Palpable pedal pulses bilaterally Lungs: Respiratory effort nonlabored Abd: soft, NT, ND, +BS, no masses, hernias, or organomegaly; negative Murphy sign MS: all 4 extremities are symmetrical with no cyanosis, clubbing, or edema. Psych: A&Ox3 with an appropriate affect.   Results for orders placed or performed during the hospital encounter of 10/19/22 (from the past 48 hour(s))  CBC with Differential     Status: Abnormal   Collection Time: 10/19/22 11:23 AM  Result Value Ref Range   WBC 21.6 (H) 4.0 - 10.5 K/uL   RBC 5.11 3.87 - 5.11 MIL/uL   Hemoglobin 14.6 12.0 - 15.0 g/dL   HCT 16.1 09.6 - 04.5 %   MCV 89.8 80.0 - 100.0 fL   MCH 28.6 26.0 - 34.0 pg   MCHC 31.8 30.0 - 36.0 g/dL   RDW 40.9 81.1 - 91.4 %   Platelets 259 150 - 400 K/uL   nRBC 0.0 0.0 - 0.2 %   Neutrophils Relative % 84 %   Neutro Abs 18.3 (H) 1.7 - 7.7 K/uL   Lymphocytes Relative 6 %   Lymphs Abs 1.2 0.7 - 4.0 K/uL   Monocytes Relative 9 %   Monocytes Absolute 1.9 (H) 0.1 - 1.0 K/uL   Eosinophils Relative 0 %   Eosinophils Absolute 0.0 0.0 - 0.5 K/uL   Basophils Relative 0 %   Basophils Absolute 0.1 0.0 - 0.1 K/uL   Immature Granulocytes 1 %   Abs Immature Granulocytes 0.14 (H) 0.00 - 0.07 K/uL    Comment: Performed at [facility], [address] [place], [place] [address]  Comprehensive metabolic panel     Status: Abnormal   Collection Time: 10/19/22 11:23 AM  Result Value Ref Range   Sodium 131 (L) 135 - 145 mmol/L   Potassium 3.3 (L) 3.5 - 5.1 mmol/L   Chloride 97 (L) 98 - 111 mmol/L   CO2 18 (L) 22 - 32 mmol/L   Glucose, Bld 152 (H) 70 - 99 mg/dL    Comment: Glucose reference range applies only to samples taken after fasting for at least 8 hours.  BUN 23 8 - 23 mg/dL    Creatinine, Ser 1.61 (H) 0.44 - 1.00 mg/dL   Calcium 8.8 (L) 8.9 - 10.3 mg/dL   Total Protein 7.0 6.5 - 8.1 g/dL   Albumin 3.1 (L) 3.5 - 5.0 g/dL   AST 36 15 - 41 U/L   ALT 20 0 - 44 U/L   Alkaline Phosphatase 77 38 - 126 U/L   Total Bilirubin 1.2 0.3 - 1.2 mg/dL   GFR, Estimated 31 (L) >60 mL/min    Comment: (NOTE) Calculated using the CKD-EPI Creatinine Equation (2021)    Anion gap 16 (H) 5 - 15    Comment: Performed at [facility], [address], [place], [place] [address]  Protime-INR     Status: Abnormal   Collection Time: 10/19/22 11:23 AM  Result Value Ref Range   Prothrombin Time 22.6 (H) 11.4 - 15.2 seconds   INR 2.0 (H) 0.8 - 1.2    Comment: (NOTE) INR goal varies based on device and disease states. Performed at [facility], [address], [place], [place] [address]   I-stat chem 8, ED (not at [facility], [facility] or [facility])     Status: Abnormal   Collection Time: 10/19/22 11:29 AM  Result Value Ref Range   Sodium 134 (L) 135 - 145 mmol/L   Potassium 3.3 (L) 3.5 - 5.1 mmol/L   Chloride 100 98 - 111 mmol/L   BUN 26 (H) 8 - 23 mg/dL   Creatinine, Ser 1.19 (H) 0.44 - 1.00 mg/dL   Glucose, Bld 147 (H) 70 - 99 mg/dL    Comment: Glucose reference range applies only to samples taken after fasting for at least 8 hours.   Calcium, Ion 1.02 (L) 1.15 - 1.40 mmol/L   TCO2 20 (L) 22 - 32 mmol/L   Hemoglobin 16.3 (H) 12.0 - 15.0 g/dL   HCT 82.9 (H) 56.2 - 13.0 %   CT ABDOMEN PELVIS W CONTRAST  Result Date: 10/19/2022 CLINICAL DATA:  Abdominal pain EXAM: CT ABDOMEN AND PELVIS WITH CONTRAST TECHNIQUE: Multidetector CT imaging of the abdomen and pelvis was performed using the standard protocol following bolus administration of intravenous contrast. RADIATION DOSE REDUCTION: This exam was performed according to the departmental dose-optimization program which includes automated exposure control, adjustment of the mA and/or kV according to patient size and/or use of iterative  reconstruction technique. CONTRAST:  70mL OMNIPAQUE IOHEXOL 350 MG/ML SOLN COMPARISON:  None Available. FINDINGS: Lower chest: Heart is slightly enlarged. Significant calcifications along the mitral valve annulus. There is some linear opacity at the bases likely scar or atelectasis. 6 mm subpleural nodule left lower lobe on series 4, image 3. Small hiatal hernia. Hepatobiliary: Gallbladder is dilated with wall thickening, stones and stranding. Please correlate for acute cholecystitis. There may be a stone towards the neck as well. Underlying fatty liver infiltration. Patent portal vein. Pancreas: Severe fatty atrophy of the pancreas. There is a cystic lesion towards the tail of the pancreas measuring 2.7 by 2.5 cm. Series 3, image 27. Spleen: Normal in size without focal abnormality. Adrenals/Urinary Tract: The adrenal glands are preserved. Bilateral small renal stones. These punctate overall except for 4 mm lower pole right-sided stone. There is cyst along the lower pole left kidney measuring 2.2 cm with Hounsfield unit of 11. Bosniak 1 lesion. Tiny focus on the right, Bosniak 2 lesion. Again no specific imaging follow-up of the cysts. The ureters have normal course and caliber extending down to the bladder. Bladder  is underdistended. Stomach/Bowel: There is some fluid in the stomach. Stomach is nondilated. The small bowel is nondilated. Large bowel has a normal course and caliber. Normal appendix. Vascular/Lymphatic: Normal caliber aorta and IVC. Mild vascular calcifications. No specific abnormal lymph node enlargement identified in the abdomen and pelvis. Replaced left hepatic artery from the left gastric. Reproductive: Status post hysterectomy. No adnexal masses. Other: Small amount of free fluid dependently in the pelvis, nonspecific. No free air. Fat containing umbilical hernia. Musculoskeletal: Scattered degenerative changes along the spine with levoconvex curvature of the lower lumbar spine centered at L4.  There is also multilevel trace retrolisthesis and multilevel stenosis. IMPRESSION: Abnormal gallbladder with dilatation and stones. There is significant wall thickening, edema and adjacent stranding consistent with a acute cholecystitis. Possible stone in the neck of the gallbladder as well. 2.7 cm cystic lesion along the tail of the pancreas. In the absence of prior would recommend follow up imaging in 6 months. Electronically Signed   By: Karen Kays M.D.   On: 10/19/2022 12:35   CT Head Wo Contrast  Result Date: 10/19/2022 CLINICAL DATA:  Headache, new onset. EXAM: CT HEAD WITHOUT CONTRAST TECHNIQUE: Contiguous axial images were obtained from the base of the skull through the vertex without intravenous contrast. RADIATION DOSE REDUCTION: This exam was performed according to the departmental dose-optimization program which includes automated exposure control, adjustment of the mA and/or kV according to patient size and/or use of iterative reconstruction technique. COMPARISON:  Head CT 06/28/2021.  MRI brain 06/28/2021. FINDINGS: Brain: No acute hemorrhage. Unchanged mild chronic small-vessel disease with old lacunar infarcts in the bilateral caudate heads. Cortical gray-white differentiation is otherwise preserved. Prominence of the ventricles and sulci within expected range for age. No hydrocephalus or extra-axial collection. No mass effect or midline shift. Vascular: No hyperdense vessel or unexpected calcification. Skull: No calvarial fracture or suspicious bone lesion. Skull base is unremarkable. Sinuses/Orbits: Unremarkable. Other: None IMPRESSION: 1. No acute intracranial abnormality. 2. Unchanged mild chronic small-vessel disease. Electronically Signed   By: Orvan Falconer M.D.   On: 10/19/2022 12:31      Assessment/Plan Possible cholecystitis  - CT AP with gallbladder wall thickening, cholelithiasis and pericholecystic stranding, possible stone at the neck of the gallbladder - WBC elevated to  21.6 and in A.Fib with RVR this AM - interestingly she has no pain on exam, and actually came in secondary to diarrhea that has been dark in color with some BRBPR as well. Hgb is 14.6 and baseline appears around 14.1 - on eliquis for A. Fib, last dose this AM - recommend admission to [facility] vs [facility] in setting of A. Fib with RVR and empiric abx coverage, if patient starts looking more unstable or more septic at any point in time would recommend IR consult for percutaneous cholecystostomy drain placement  - we will follow   FEN: ok to have CLD from surgery standpoint VTE: hold eliquis, can have heparin gtt if needed  ID: [id number] 5/28>>   I reviewed ED provider notes, last 24 h vitals and pain scores, last 48 h intake and output, last 24 h labs and trends, last 24 h imaging results, and discussed with [facility] .   Juliet Rude, [facility] 10/19/2022, 2:31 PM Please see Amion for pager number during day hours 7:00am-4:30pm

## 2022-10-19 NOTE — ED Provider Notes (Incomplete)
[place] EMERGENCY DEPARTMENT AT [facility] Provider Note   CSN: [id number] Arrival date & time: 10/19/22  1047     History {Add pertinent medical, surgical, social history, OB history to HPI:1} Chief Complaint  Patient presents with  . Diarrhea    Laura Mendez is a 82 y.o. female.  HPI 82 year old female history of paroxysmal atrial fibrillation, on chronic anticoagulation, history of stroke with residual right-sided weakness, history of CHF, hyperlipidemia presents today complaining of 3 to 4 days of diarrhea.  Patient and son are at bedside.  She reports that she has been increasingly weak and had nausea and vomiting several days ago.  This has resolved but she has not been eating or drinking.  She has had multiple episodes of loose stool every day.  She has not had any definite fever, chills, head injury, chest pain, dyspnea, or cough.  She reports that she has not been on any recent antibiotics.  She lives alone at home.  Her son has come over to check on her is concerned because she is weak and not eating well.     Home Medications Prior to Admission medications   Medication Sig Start Date End Date Taking? Authorizing Provider  acetaminophen (TYLENOL) 325 MG tablet Take 2 tablets (650 mg total) by mouth every 4 (four) hours as needed for mild pain (or temp > 37.5 C (99.5 F)). 07/14/21   Angiulli, Mcarthur Rossetti, PA-C  albuterol (VENTOLIN HFA) 108 (90 Base) MCG/ACT inhaler INHALE 2 PUFFS BY MOUTH EVERY 4 HOURS AS NEEDED 10/06/22   Jetty Duhamel D, MD  ammonium lactate (LAC-HYDRIN) 12 % cream Apply 1 Application topically as needed for dry skin. 04/28/22   Ellsworth Lennox, PA-C  apixaban (ELIQUIS) 5 MG TABS tablet Take 1 tablet by mouth twice daily 07/26/22   Lanier Prude, MD  DUPIXENT 300 MG/2ML SOPN Inject 300 mg into the skin every 14 (fourteen) days. 10/30/21   [provider]  fluticasone-salmeterol (ADVAIR) 250-50 MCG/ACT AEPB INHALE 1 PUFF INTO LUNGS  TWICE DAILY 05/20/22   Waymon Budge, MD  hydrOXYzine (ATARAX) 25 MG tablet Take 1 tablet (25 mg total) by mouth every 6 (six) hours. 03/10/22   Valinda Hoar, NP  letrozole [facility]) 2.5 MG tablet Take 1 tablet by mouth once daily 10/08/22   Serena Croissant, MD  Multiple Vitamin (MULTIVITAMIN) tablet Take 1 tablet by mouth daily.    [provider]  pantoprazole (PROTONIX) 40 MG tablet Take 1 tablet by mouth once daily 10/08/22   [name], MD  pimecrolimus (ELIDEL) 1 % cream Apply topically 2 (two) times daily. Do not occlude, do not use longer than 6 weeks 03/24/22   Verlon Setting, Whitney L, PA  predniSONE (DELTASONE) 10 MG tablet 1 daily or as directed 07/29/22   Jetty Duhamel D, MD  PRESCRIPTION MEDICATION Inhale 1 ampule into the lungs daily as needed (SOB). Nebulizer solution    [provider]  triamcinolone cream (KENALOG) 0.1 % Apply 1 Application topically 2 (two) times daily. 04/28/22   Ellsworth Lennox, PA-C      Allergies    Clarithromycin, Aspirin, Ibuprofen, Codeine, Codeine phosphate, Diltiazem, Methotrexate, Tape, Tapentadol, and Verapamil hcl er    Review of Systems   Review of Systems  Physical Exam Updated Vital Signs BP (!) 142/102   Pulse 69   Temp 98.7 F (37.1 C) (Oral)   Resp (!) 32   SpO2 94%  Physical Exam Vitals and nursing note reviewed.  Constitutional:      General: She is not in acute distress.    Appearance: She is ill-appearing.     Comments: Patient has a blood pressure of 96/57 with heart rate of 55 recorded  HENT:     Head: Normocephalic.     Right Ear: External ear normal.     Left Ear: External ear normal.     Mouth/Throat:     Mouth: Mucous membranes are dry.  Eyes:     Extraocular Movements: Extraocular movements intact.     Pupils: Pupils are equal, round, and reactive to light.  Cardiovascular:     Rate and Rhythm: Tachycardia present. Rhythm irregular.     Pulses: Normal pulses.  Pulmonary:     Effort:  Pulmonary effort is normal.     Breath sounds: Normal breath sounds.  Abdominal:     General: There is no distension.     Tenderness: There is abdominal tenderness.     Comments: Diffuse tenderness to palpation no rebound noted  Musculoskeletal:        General: No swelling or tenderness. Normal range of motion.     Cervical back: Normal range of motion.  Skin:    General: Skin is warm and dry.     Capillary Refill: Capillary refill takes less than 2 seconds.  Neurological:     General: No focal deficit present.     Mental Status: She is alert.  Psychiatric:        Mood and Affect: Mood normal.    ED Results / Procedures / Treatments   Labs (all labs ordered are listed, but only abnormal results are displayed) Labs Reviewed  CBC WITH DIFFERENTIAL/PLATELET - Abnormal; Notable for the following components:      Result Value   WBC 21.6 (*)    Neutro Abs 18.3 (*)    Monocytes Absolute 1.9 (*)    Abs Immature Granulocytes 0.14 (*)    All other components within normal limits  COMPREHENSIVE METABOLIC PANEL - Abnormal; Notable for the following components:   Sodium 131 (*)    Potassium 3.3 (*)    Chloride 97 (*)    CO2 18 (*)    Glucose, Bld 152 (*)    Creatinine, Ser 1.64 (*)    Calcium 8.8 (*)    Albumin 3.1 (*)    GFR, Estimated 31 (*)    Anion gap 16 (*)    All other components within normal limits  PROTIME-INR - Abnormal; Notable for the following components:   Prothrombin Time 22.6 (*)    INR 2.0 (*)    All other components within normal limits  I-STAT CHEM 8, ED - Abnormal; Notable for the following components:   Sodium 134 (*)    Potassium 3.3 (*)    BUN 26 (*)    Creatinine, Ser 1.50 (*)    Glucose, Bld 152 (*)    Calcium, Ion 1.02 (*)    TCO2 20 (*)    Hemoglobin 16.3 (*)    HCT 48.0 (*)    All other components within normal limits  C DIFFICILE QUICK SCREEN W PCR REFLEX    CULTURE, BLOOD (ROUTINE X 2)  CULTURE, BLOOD (ROUTINE X 2)  URINALYSIS, ROUTINE W  REFLEX MICROSCOPIC  LACTIC ACID, PLASMA  LACTIC ACID, PLASMA  POC OCCULT BLOOD, ED    EKG EKG Interpretation  Date/Time:  Tuesday Oct 19 2022 11:04:36 EDT Ventricular Rate:  155 PR Interval:    QRS Duration: 101 QT  Interval:  272 QTC Calculation: 437 R Axis:   6 Text Interpretation: Atrial fibrillation with rapid V-rate Ventricular premature complex Repolarization abnormality, prob rate related Confirmed by Margarita Grizzle [phone]) on 10/19/2022 11:11:49 AM  Radiology CT ABDOMEN PELVIS W CONTRAST  Result Date: 10/19/2022 CLINICAL DATA:  Abdominal pain EXAM: CT ABDOMEN AND PELVIS WITH CONTRAST TECHNIQUE: Multidetector CT imaging of the abdomen and pelvis was performed using the standard protocol following bolus administration of intravenous contrast. RADIATION DOSE REDUCTION: This exam was performed according to the departmental dose-optimization program which includes automated exposure control, adjustment of the mA and/or kV according to patient size and/or use of iterative reconstruction technique. CONTRAST:  70mL OMNIPAQUE IOHEXOL 350 MG/ML SOLN COMPARISON:  None Available. FINDINGS: Lower chest: Heart is slightly enlarged. Significant calcifications along the mitral valve annulus. There is some linear opacity at the bases likely scar or atelectasis. 6 mm subpleural nodule left lower lobe on series 4, image 3. Small hiatal hernia. Hepatobiliary: Gallbladder is dilated with wall thickening, stones and stranding. Please correlate for acute cholecystitis. There may be a stone towards the neck as well. Underlying fatty liver infiltration. Patent portal vein. Pancreas: Severe fatty atrophy of the pancreas. There is a cystic lesion towards the tail of the pancreas measuring 2.7 by 2.5 cm. Series 3, image 27. Spleen: Normal in size without focal abnormality. Adrenals/Urinary Tract: The adrenal glands are preserved. Bilateral small renal stones. These punctate overall except for 4 mm lower pole  right-sided stone. There is cyst along the lower pole left kidney measuring 2.2 cm with Hounsfield unit of 11. Bosniak 1 lesion. Tiny focus on the right, Bosniak 2 lesion. Again no specific imaging follow-up of the cysts. The ureters have normal course and caliber extending down to the bladder. Bladder is underdistended. Stomach/Bowel: There is some fluid in the stomach. Stomach is nondilated. The small bowel is nondilated. Large bowel has a normal course and caliber. Normal appendix. Vascular/Lymphatic: Normal caliber aorta and IVC. Mild vascular calcifications. No specific abnormal lymph node enlargement identified in the abdomen and pelvis. Replaced left hepatic artery from the left gastric. Reproductive: Status post hysterectomy. No adnexal masses. Other: Small amount of free fluid dependently in the pelvis, nonspecific. No free air. Fat containing umbilical hernia. Musculoskeletal: Scattered degenerative changes along the spine with levoconvex curvature of the lower lumbar spine centered at L4. There is also multilevel trace retrolisthesis and multilevel stenosis. IMPRESSION: Abnormal gallbladder with dilatation and stones. There is significant wall thickening, edema and adjacent stranding consistent with a acute cholecystitis. Possible stone in the neck of the gallbladder as well. 2.7 cm cystic lesion along the tail of the pancreas. In the absence of prior would recommend follow up imaging in 6 months. Electronically Signed   By: Karen Kays M.D.   On: 10/19/2022 12:35   CT Head Wo Contrast  Result Date: 10/19/2022 CLINICAL DATA:  Headache, new onset. EXAM: CT HEAD WITHOUT CONTRAST TECHNIQUE: Contiguous axial images were obtained from the base of the skull through the vertex without intravenous contrast. RADIATION DOSE REDUCTION: This exam was performed according to the departmental dose-optimization program which includes automated exposure control, adjustment of the mA and/or kV according to patient  size and/or use of iterative reconstruction technique. COMPARISON:  Head CT 06/28/2021.  MRI brain 06/28/2021. FINDINGS: Brain: No acute hemorrhage. Unchanged mild chronic small-vessel disease with old lacunar infarcts in the bilateral caudate heads. Cortical gray-white differentiation is otherwise preserved. Prominence of the ventricles and sulci within expected range for age.  No hydrocephalus or extra-axial collection. No mass effect or midline shift. Vascular: No hyperdense vessel or unexpected calcification. Skull: No calvarial fracture or suspicious bone lesion. Skull base is unremarkable. Sinuses/Orbits: Unremarkable. Other: None IMPRESSION: 1. No acute intracranial abnormality. 2. Unchanged mild chronic small-vessel disease. Electronically Signed   By: Orvan Falconer M.D.   On: 10/19/2022 12:31    Procedures Procedures  {Document cardiac monitor, telemetry assessment procedure when appropriate:1}  Medications Ordered in ED Medications  piperacillin-tazobactam (ZOSYN) IVPB 3.375 g (has no administration in time range)  lactated ringers infusion (has no administration in time range)  lactated ringers bolus 1,000 mL (has no administration in time range)  lactated ringers bolus 1,000 mL (1,000 mLs Intravenous New Bag/Given 10/19/22 1135)  iohexol (OMNIPAQUE) 350 MG/ML injection 70 mL (70 mLs Intravenous Contrast Given 10/19/22 1218)    ED Course/ Medical Decision Making/ A&P Clinical Course as of 10/19/22 1304  Tue Oct 19, 2022  1303 Blood culture (routine x 2) Complete metabolic panel reviewed interpreted significant for hyponatremia sodium 131, hypokalemia potassium 3.3 and creatinine elevated at 1.64 with first prior creatinine normal [DR]  1304 CBC is reviewed and interpreted significant for leukocytosis with white blood cell count of 21,600 [DR]    Clinical Course User Index [DR] Margarita Grizzle, MD   {   Click here for ABCD2, HEART and other calculatorsREFRESH Note before signing :1}                           Medical Decision Making Amount and/or Complexity of Data Reviewed Labs: ordered. Decision-making details documented in ED Course. Radiology: ordered.  Risk Prescription drug management. Decision regarding hospitalization.  82 year old female history of paroxysmal A-fib, on chronic anticoagulation, stage II kidney disease, hyperlipidemia, stroke presents today with reports of 3 days of nausea with vomiting followed by persistent diarrhea. On evaluation here she is tachycardic with A-fib with RVR Her abdomen is diffusely tender Blood pressures have ranged from 86 systolically now recorded as 142 Heart rate is recorded as 10 7-55, however on my review of the monitor rate has been 140 Saturations have been 92 to 94% Patient has significant leukocytosis CT scan is obtained and is significant for gallbladder dilation with stones and wall thickening consistent with acute cholecystitis General surgery has been consulted.  However, patient did take her Eliquis this morning.  Additionally, she is having tachycardia and hypotension Lactic acid and blood cultures were added IV antibiotics, Zosyn added Consult to critical care pending-discussed with critical care and they will see in consultation Discussed findings with patient and her son.  At this time, they wish rest of therapy.  Discussed that if she begins to be volume overloaded we may need to consider respiratory support and will rediscuss at that time. Critical care saw and evaluated and not believe require ICU admission. Consult to hospitalist for admission placed General surgery has seen, pending recommendations   {Document critical care time when appropriate:1} {Document review of labs and clinical decision tools ie heart score, Chads2Vasc2 etc:1}  {Document your independent review of radiology images, and any outside records:1} {Document your discussion with family members, caretakers, and with  consultants:1} {Document social determinants of health affecting pt's care:1} {Document your decision making why or why not admission, treatments were needed:1} Final Clinical Impression(s) / ED Diagnoses Final diagnoses:  None    Rx / DC Orders ED Discharge Orders     None

## 2022-10-19 NOTE — H&P (Signed)
History and Physical    Patient: Laura Mendez ZOX:[id number] DOB: [birth year] DOA: 10/19/2022 DOS: the patient was seen and examined on 10/19/2022 PCP: [name], MD  Patient coming from: Home - lives alone in a townhouse community; NOK: [place], [place], [phone]   Chief Complaint: Diarrhea  HPI: Laura Mendez is a 82 y.o. female with medical history significant of breast CA, chronic diastolic CHF, afib, and CVA presenting with 3-4 days of diarrhea. She reports that she got up and "felt strange."  She called out.  They were going to her PCP and decided to go to the ER instead.  She noticed a feeling that something wasn't right.  She has been having diarrhea for a couple of days and it wasn't getting better.  She doesn't think she actually had diarrhea today.  Denies abdominal pain.  Her son reports a mini-stroke about 1 1/2 years ago, has had some memory issues.  She got lost driving once about 8 months ago - he thinks this was related to a wrong turn.   ER Course:  Sick for 4 days - n/v -> diarrhea.  Unable to eat/drink.  Afib with RVR to 150 on presentation with diffuse abdominal pain.  CT with acute cholecystitis.  WBC 21.  Given IVF, HR improving and currently 128, SBP 90-115.  PCCM recommends medicine admission.  Surgery is consulting.  Given Zosyn.     Review of Systems: As mentioned in the history of present illness. All other systems reviewed and are negative. Past Medical History:  Diagnosis Date   Asthma    Breast cancer (HCC)    Chronic airway obstruction, not elsewhere classified    Diastolic dysfunction without heart failure    Disorder of bone and cartilage, unspecified    Dysrhythmia    A-Fib   Family history of colon cancer    Family history of kidney cancer    Family history of ovarian cancer    Paroxysmal atrial flutter (HCC)    Stroke (HCC)    TIA   TIA (transient ischemic attack)    Unspecified asthma(493.90)    Past Surgical  History:  Procedure Laterality Date   ABDOMINAL HYSTERECTOMY     BREAST LUMPECTOMY Right 12/30/2017   BREAST LUMPECTOMY WITH RADIOACTIVE SEED LOCALIZATION Right 12/30/2017   Procedure: BREAST LUMPECTOMY WITH RADIOACTIVE SEED LOCALIZATION X'S 3;  Surgeon: Emelia Loron, MD;  Location: [facility] OR;  Service: General;  Laterality: Right;   BUNIONECTOMY Bilateral    EYE SURGERY Bilateral    cataract removal   LEFT HEART CATH AND CORONARY ANGIOGRAPHY N/A 11/23/2021   Procedure: LEFT HEART CATH AND CORONARY ANGIOGRAPHY;  Surgeon: Tonny Bollman, MD;  Location: [facility] INVASIVE CV LAB;  Service: Cardiovascular;  Laterality: N/A;   Social History:  reports that she quit smoking about 45 years ago. Her smoking use included cigarettes. She has never used smokeless tobacco. She reports that she does not drink alcohol and does not use drugs.  Allergies  Allergen Reactions   Clarithromycin Anaphylaxis    "just about killed me"   Aspirin Hives   Ibuprofen Hives   Codeine Nausea And Vomiting   Codeine Phosphate Nausea And Vomiting   Diltiazem Rash   Methotrexate Other (See Comments)     tongue swelling and mouth soreness   Tape Itching and Other (See Comments)    Tears skin   Tapentadol Itching    Other reaction(s): Other (See Comments) Tears skin   Verapamil Hcl Er Rash  Family History  Problem Relation Age of Onset   COPD Father    Colon cancer Mother 15       mets to pancreas and liver   Kidney cancer Sister 37       d. 39   Lung cancer Sister        d. 31, smoker   Ovarian cancer Sister 1       d. 50   Cirrhosis Sister        d. 93   Melanoma Sister 61   Ovarian cancer Maternal Grandmother        d. 16   Heart disease Maternal Grandfather        rheumatic heart disease   Rectal cancer Paternal Grandfather        d. 98   Parkinson's disease Brother    Ovarian cancer Maternal Aunt    Colon cancer Maternal Uncle        d. 72-73   Heart attack Paternal Uncle        d. 73    Kidney cancer Brother 52   Colon cancer Maternal Uncle        d. 52   Cancer Other        MGMs pat 1/2 sister with uterine and rectal cancer   Colon cancer Cousin        d. 9 - mat first cousin   Breast cancer Neg Hx     Prior to Admission medications   Medication Sig Start Date End Date Taking? Authorizing Provider  acetaminophen (TYLENOL) 325 MG tablet Take 2 tablets (650 mg total) by mouth every 4 (four) hours as needed for mild pain (or temp > 37.5 C (99.5 F)). 07/14/21   Angiulli, Mcarthur Rossetti, PA-C  albuterol (VENTOLIN HFA) 108 (90 Base) MCG/ACT inhaler INHALE 2 PUFFS BY MOUTH EVERY 4 HOURS AS NEEDED 10/06/22   Jetty Duhamel D, MD  ammonium lactate (LAC-HYDRIN) 12 % cream Apply 1 Application topically as needed for dry skin. 04/28/22   Ellsworth Lennox, PA-C  apixaban (ELIQUIS) 5 MG TABS tablet Take 1 tablet by mouth twice daily 07/26/22   Lanier Prude, MD  DUPIXENT 300 MG/2ML SOPN Inject 300 mg into the skin every 14 (fourteen) days. 10/30/21   [provider]  fluticasone-salmeterol (ADVAIR) 250-50 MCG/ACT AEPB INHALE 1 PUFF INTO LUNGS TWICE DAILY 05/20/22   Waymon Budge, MD  hydrOXYzine (ATARAX) 25 MG tablet Take 1 tablet (25 mg total) by mouth every 6 (six) hours. 03/10/22   Valinda Hoar, NP  letrozole [facility]) 2.5 MG tablet Take 1 tablet by mouth once daily 10/08/22   Serena Croissant, MD  Multiple Vitamin (MULTIVITAMIN) tablet Take 1 tablet by mouth daily.    [provider]  pantoprazole (PROTONIX) 40 MG tablet Take 1 tablet by mouth once daily 10/08/22   [name], MD  pimecrolimus (ELIDEL) 1 % cream Apply topically 2 (two) times daily. Do not occlude, do not use longer than 6 weeks 03/24/22   Verlon Setting, Whitney L, PA  predniSONE (DELTASONE) 10 MG tablet 1 daily or as directed 07/29/22   Jetty Duhamel D, MD  PRESCRIPTION MEDICATION Inhale 1 ampule into the lungs daily as needed (SOB). Nebulizer solution    [provider]  triamcinolone cream  (KENALOG) 0.1 % Apply 1 Application topically 2 (two) times daily. 04/28/22   Ellsworth Lennox, PA-C    Physical Exam: Vitals:   10/19/22 1500 10/19/22 1515 10/19/22 1600 10/19/22 1703  BP:  107/69   (!) 118/55  Pulse: 71 67 64 81  Resp: (!) 26 (!) 23 (!) 25 20  Temp:      TempSrc:      SpO2: 91% 93% 95% 93%  Weight:   90 kg   Height:   5\' 11"  (1.803 m)    General:  Appears calm and comfortable and is in NAD, somewhat confused, poor historian Eyes:   EOMI, normal lids, iris ENT:  grossly normal hearing, lips & tongue, mildly dry mm Neck:  no LAD, masses or thyromegaly Cardiovascular:  Irregularly irregular with PVCs but now rate controlled, no m/r/g. No LE edema.  Respiratory:   CTA bilaterally with no wheezes/rales/rhonchi.  Normal t mildly increased respiratory effort. Abdomen:  soft, NT, ND Skin:  no rash or induration seen on limited exam Musculoskeletal:  grossly normal tone BUE/BLE, good ROM, no bony abnormality Psychiatric:  mildly confused mood and affect, speech fluent and appropriate, AOx2 Neurologic:  CN 2-12 grossly intact, moves all extremities in coordinated fashion   Radiological Exams on Admission: Independently reviewed - see discussion in A/P where applicable  CT ABDOMEN PELVIS W CONTRAST  Result Date: 10/19/2022 CLINICAL DATA:  Abdominal pain EXAM: CT ABDOMEN AND PELVIS WITH CONTRAST TECHNIQUE: Multidetector CT imaging of the abdomen and pelvis was performed using the standard protocol following bolus administration of intravenous contrast. RADIATION DOSE REDUCTION: This exam was performed according to the departmental dose-optimization program which includes automated exposure control, adjustment of the mA and/or kV according to patient size and/or use of iterative reconstruction technique. CONTRAST:  70mL OMNIPAQUE IOHEXOL 350 MG/ML SOLN COMPARISON:  None Available. FINDINGS: Lower chest: Heart is slightly enlarged. Significant calcifications along the mitral valve  annulus. There is some linear opacity at the bases likely scar or atelectasis. 6 mm subpleural nodule left lower lobe on series 4, image 3. Small hiatal hernia. Hepatobiliary: Gallbladder is dilated with wall thickening, stones and stranding. Please correlate for acute cholecystitis. There may be a stone towards the neck as well. Underlying fatty liver infiltration. Patent portal vein. Pancreas: Severe fatty atrophy of the pancreas. There is a cystic lesion towards the tail of the pancreas measuring 2.7 by 2.5 cm. Series 3, image 27. Spleen: Normal in size without focal abnormality. Adrenals/Urinary Tract: The adrenal glands are preserved. Bilateral small renal stones. These punctate overall except for 4 mm lower pole right-sided stone. There is cyst along the lower pole left kidney measuring 2.2 cm with Hounsfield unit of 11. Bosniak 1 lesion. Tiny focus on the right, Bosniak 2 lesion. Again no specific imaging follow-up of the cysts. The ureters have normal course and caliber extending down to the bladder. Bladder is underdistended. Stomach/Bowel: There is some fluid in the stomach. Stomach is nondilated. The small bowel is nondilated. Large bowel has a normal course and caliber. Normal appendix. Vascular/Lymphatic: Normal caliber aorta and IVC. Mild vascular calcifications. No specific abnormal lymph node enlargement identified in the abdomen and pelvis. Replaced left hepatic artery from the left gastric. Reproductive: Status post hysterectomy. No adnexal masses. Other: Small amount of free fluid dependently in the pelvis, nonspecific. No free air. Fat containing umbilical hernia. Musculoskeletal: Scattered degenerative changes along the spine with levoconvex curvature of the lower lumbar spine centered at L4. There is also multilevel trace retrolisthesis and multilevel stenosis. IMPRESSION: Abnormal gallbladder with dilatation and stones. There is significant wall thickening, edema and adjacent stranding  consistent with a acute cholecystitis. Possible stone in the neck of the gallbladder as well.  2.7 cm cystic lesion along the tail of the pancreas. In the absence of prior would recommend follow up imaging in 6 months. Electronically Signed   By: Karen Kays M.D.   On: 10/19/2022 12:35   CT Head Wo Contrast  Result Date: 10/19/2022 CLINICAL DATA:  Headache, new onset. EXAM: CT HEAD WITHOUT CONTRAST TECHNIQUE: Contiguous axial images were obtained from the base of the skull through the vertex without intravenous contrast. RADIATION DOSE REDUCTION: This exam was performed according to the departmental dose-optimization program which includes automated exposure control, adjustment of the mA and/or kV according to patient size and/or use of iterative reconstruction technique. COMPARISON:  Head CT 06/28/2021.  MRI brain 06/28/2021. FINDINGS: Brain: No acute hemorrhage. Unchanged mild chronic small-vessel disease with old lacunar infarcts in the bilateral caudate heads. Cortical gray-white differentiation is otherwise preserved. Prominence of the ventricles and sulci within expected range for age. No hydrocephalus or extra-axial collection. No mass effect or midline shift. Vascular: No hyperdense vessel or unexpected calcification. Skull: No calvarial fracture or suspicious bone lesion. Skull base is unremarkable. Sinuses/Orbits: Unremarkable. Other: None IMPRESSION: 1. No acute intracranial abnormality. 2. Unchanged mild chronic small-vessel disease. Electronically Signed   By: Orvan Falconer M.D.   On: 10/19/2022 12:31    EKG: Independently reviewed.  Afib with RVR with rate 155; nonspecific ST changes with no evidence of acute ischemia   Labs on Admission: I have personally reviewed the available labs and imaging studies at the time of the admission.  Pertinent labs:    Na++ 131 K+ 3.3 CO2 18 Glucose 152 BUN 23/Creatinine 1.64/GFR 31; 22/0.73/>60 in 11/2021 Anion gap 16 Albumin 3.1 WBC 21.6 INR  2.0 UA: moderate Hgb, 20 ketones, large LE, 100 protein, few bacteria   Assessment and Plan: Principal Problem:   Acute cholecystitis Active Problems:   Asthma with COPD (HCC)   Paroxysmal atrial fibrillation (HCC)   Malignant neoplasm of lower-outer quadrant of right breast of female, estrogen receptor positive (HCC)   CVA (cerebral vascular accident) (HCC)   Atopic dermatitis   AKI (acute kidney injury) (HCC)   Pancreatic lesion    Acute cholecystitis -Patient presenting with n/v/d -SIRS criteria with tachycardia, leukocytosis but no acute organ dysfunction to suggest sepsis -EDP noted diffuse TTP on abdominal exam (not really present at the time of my evaluation) -Imaging with abnormal gallbladder dilatation and stones with significant wall thickening, edema, and stranding; also with possible stone in gallbladder neck -Clear liquids, NPO after midnight -Morphine for pain, Zofran for nausea -Surgery is consulting for probable cholecystectomy; if hemodynamics do not stabilize, she may require percutaneous drain placement instead -Empiric coverage with Zosyn for now  -C diff was ordered by EDP, no further diarrhea so not yet collected  Afib with RVR  -Patient with known afib presenting with RVR -This is likely related to acute infection and dehydration  -Hemodynamic stability was initially of concern and so PCCM was consulted but she is improved now and looked to be going back into NSR at the time of my evaluation -Will admit to SDU on Amiodarone drip (ordered by Dr. Merrily Pew) as per protocol; patient needs admission based on unstable vital signs and/or afib associated with a high-risk situation (acute cholecystitis). -Heart rate is well controlled at this time -CHA2DS2-VASc Score is >2 and so patient would benefit from oral anticoagulation. Hold home Eliquis and cover with IV heparin for now.  AKI -Patient without baseline compromised renal function  -Current creatinine is  increased at least  1.5 times compared to baseline and presumed to have occurred within the last 7 days -Likely due to prerenal secondary to severe dehydration -IVF -Follow up renal function by BMP -Avoid ACEI and NSAIDs   H/o CVA with MCI -Son reports some MCI since prior CVA -Appears to have mild to moderate dementia on exam today - possibly delirium exacerbated by dehydration -Will order delirium precautions  Asthma with COPD -Son reports that she uses albuterol 4-5x/day - which is clearly too much -Will give albuterol MDI 1-2 puffs q4h prn wheezing/SOB but try not to escalate given her RVR on presentation -[place] appears to be on mepolizumab -Continue Advair (Dulera per formulary)  Atopic dermatitis -Hold Dupixent  H/o breast cancer -s/p lumpectomy, no need for adjuvant radiation -Has been on letrozole since 12/2017  -Hold letrozole for now  Pancreatic lesion -2.7 cm cystic lesion incidentally noted in tail of the pancreas -She is recommended for repeat imaging in 6 months   Advance Care Planning:   Code Status: Full Code - Code status was discussed with the patient at the time of admission.  The patient would want to receive full resuscitative measures at this time.   Consults: Cardiology; PCCM  DVT Prophylaxis: Heparin drip  Family Communication: None present; I spoke to her son by telephone (he was in the room by then) shortly after my evaluation  Severity of Illness: The appropriate patient status for this patient is INPATIENT. Inpatient status is judged to be reasonable and necessary in order to provide the required intensity of service to ensure the patient's safety. The patient's presenting symptoms, physical exam findings, and initial radiographic and laboratory data in the context of their chronic comorbidities is felt to place them at high risk for further clinical deterioration. Furthermore, it is not anticipated that the patient will be medically stable for discharge  from the hospital within 2 midnights of admission.   * I certify that at the point of admission it is my clinical judgment that the patient will require inpatient hospital care spanning beyond 2 midnights from the point of admission due to high intensity of service, high risk for further deterioration and high frequency of surveillance required.*  Author: Jonah Blue, MD 10/19/2022 6:14 PM  For on call review [email or web address].

## 2022-10-20 DIAGNOSIS — K810 Acute cholecystitis: Secondary | ICD-10-CM | POA: Diagnosis not present

## 2022-10-20 DIAGNOSIS — I4891 Unspecified atrial fibrillation: Secondary | ICD-10-CM | POA: Diagnosis not present

## 2022-10-20 LAB — CBC
HCT: 36.9 % (ref 36.0–46.0)
HCT: 36.8 % (ref 36.0–46.0)
Hemoglobin: 11.9 g/dL — ABNORMAL LOW (ref 12.0–15.0)
Hemoglobin: 11.8 g/dL — ABNORMAL LOW (ref 12.0–15.0)
MCH: 29.2 pg (ref 26.0–34.0)
MCH: 29.7 pg (ref 26.0–34.0)
MCHC: 32.2 g/dL (ref 30.0–36.0)
MCHC: 32.1 g/dL (ref 30.0–36.0)
MCV: 90.7 fL (ref 80.0–100.0)
MCV: 92.7 fL (ref 80.0–100.0)
Platelets: 226 10*3/uL (ref 150–400)
Platelets: 200 10*3/uL (ref 150–400)
RBC: 4.07 MIL/uL (ref 3.87–5.11)
RBC: 3.97 MIL/uL (ref 3.87–5.11)
RDW: 14.1 % (ref 11.5–15.5)
RDW: 14 % (ref 11.5–15.5)
WBC: 13.7 10*3/uL — ABNORMAL HIGH (ref 4.0–10.5)
WBC: 12.4 10*3/uL — ABNORMAL HIGH (ref 4.0–10.5)
nRBC: 0 % (ref 0.0–0.2)
nRBC: 0 % (ref 0.0–0.2)

## 2022-10-20 LAB — MAGNESIUM
Magnesium: 1.8 mg/dL (ref 1.7–2.4)
Magnesium: 1.8 mg/dL (ref 1.7–2.4)

## 2022-10-20 LAB — BASIC METABOLIC PANEL
Anion gap: 13 (ref 5–15)
BUN: 19 mg/dL (ref 8–23)
CO2: 22 mmol/L (ref 22–32)
Calcium: 7.9 mg/dL — ABNORMAL LOW (ref 8.9–10.3)
Chloride: 99 mmol/L (ref 98–111)
Creatinine, Ser: 1.15 mg/dL — ABNORMAL HIGH (ref 0.44–1.00)
GFR, Estimated: 48 mL/min — ABNORMAL LOW (ref >60)
Glucose, Bld: 139 mg/dL — ABNORMAL HIGH (ref 70–99)
Potassium: 3 mmol/L — ABNORMAL LOW (ref 3.5–5.1)
Sodium: 134 mmol/L — ABNORMAL LOW (ref 135–145)

## 2022-10-20 LAB — HEPARIN LEVEL (UNFRACTIONATED): Heparin Unfractionated: >1.1 IU/mL — ABNORMAL HIGH (ref 0.30–0.70)

## 2022-10-20 LAB — APTT
aPTT: 58 seconds — ABNORMAL HIGH (ref 24–36)
aPTT: 50 seconds — ABNORMAL HIGH (ref 24–36)
aPTT: 89 seconds — ABNORMAL HIGH (ref 24–36)

## 2022-10-20 LAB — CULTURE, BLOOD (ROUTINE X 2): Special Requests: ADEQUATE

## 2022-10-20 MED ORDER — POTASSIUM CHLORIDE 10 MEQ/100ML IV SOLN
10.0000 meq | INTRAVENOUS | Status: AC
  Administered 2022-10-20 (×6): 10 meq via INTRAVENOUS
  Filled 2022-10-20 (×6): qty 100

## 2022-10-20 MED ORDER — AMIODARONE HCL 200 MG PO TABS
400.0000 mg | ORAL_TABLET | Freq: Two times a day (BID) | ORAL | Status: DC
  Administered 2022-10-20: 400 mg via ORAL
  Filled 2022-10-20: qty 2

## 2022-10-20 MED ORDER — AMIODARONE HCL 200 MG PO TABS: 200.0000 mg | ORAL_TABLET | Freq: Every day | ORAL | Status: DC

## 2022-10-20 MED ORDER — AMIODARONE HCL IN DEXTROSE 360-4.14 MG/200ML-% IV SOLN
30.0000 mg/h | INTRAVENOUS | Status: DC
  Administered 2022-10-21 – 2022-10-26 (×10): 30 mg/h via INTRAVENOUS
  Filled 2022-10-20 (×9): qty 200

## 2022-10-20 MED ORDER — AMIODARONE HCL IN DEXTROSE 360-4.14 MG/200ML-% IV SOLN
60.0000 mg/h | INTRAVENOUS | Status: DC
  Administered 2022-10-20 – 2022-10-21 (×2): 60 mg/h via INTRAVENOUS
  Filled 2022-10-20 (×2): qty 200

## 2022-10-20 NOTE — Consult Note (Signed)
Cardiology Consultation   Patient ID: Laura Mendez MRN: [id number]; DOB: 02-05-1941  Admit date: 10/19/2022 Date of Consult: 10/20/2022  PCP:  [name], MD   [facility] Providers Cardiologist:  Donato Schultz, MD  Electrophysiologist:  Lanier Prude, MD       Patient Profile:   Laura Mendez is a 82 y.o. 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 Renford Dills.  History of Present Illness:   Ms. Dapice presents to the ED with 3-4 days of diarrhea; N/V.  On exam she was ill appearing and had abd TTP. Her white count was 21, crt 1.6 (bsl nl). She had SIRS. CT showed gallbladder dilation with stones and wall thickening consistent with acute cholecystitis. Gen surgery was consulted considering chole vs. Percutaneous drain.   Her EKG showed afib with RVR and therefore cardiology was consulted. Rates up to 155. She was started on IV amiodarone.   She follows with Dr. Anne Fu. Has hx of flutter and fib. Atrial flutter was diagnosed on 12/11/2015. Was on cardizem which was stopped 2/2 rash, but unclear if this was correlated. She was started on verapamil. Has not had AAD or ablation. Currently not on rate control.    She had CP July 2023 and was admitted to the hospital at that time. She was in afib wih RVR. Echo at that time showed normal LV/RV function. No significant valve disease.  LHC at that time showed. Patent CORS, normal filling pressures.   Today, she is HDS. She is in sinus rhythm on IV amio.  Past Medical History:  Diagnosis Date   Asthma    Breast cancer (HCC)    Chronic airway obstruction, not elsewhere classified    Diastolic dysfunction without heart failure    Disorder of bone and cartilage, unspecified    Dysrhythmia    A-Fib    Family history of colon cancer    Family history of kidney cancer    Family history of ovarian cancer    Paroxysmal atrial flutter (HCC)    Stroke (HCC)    TIA   TIA (transient ischemic attack)    Unspecified asthma(493.90)     Past Surgical History:  Procedure Laterality Date   ABDOMINAL HYSTERECTOMY     BREAST LUMPECTOMY Right 12/30/2017   BREAST LUMPECTOMY WITH RADIOACTIVE SEED LOCALIZATION Right 12/30/2017   Procedure: BREAST LUMPECTOMY WITH RADIOACTIVE SEED LOCALIZATION X'S 3;  Surgeon: Emelia Loron, MD;  Location: [facility] OR;  Service: General;  Laterality: Right;   BUNIONECTOMY Bilateral    EYE SURGERY Bilateral    cataract removal   LEFT HEART CATH AND CORONARY ANGIOGRAPHY N/A 11/23/2021   Procedure: LEFT HEART CATH AND CORONARY ANGIOGRAPHY;  Surgeon: Tonny Bollman, MD;  Location: [facility] INVASIVE CV LAB;  Service: Cardiovascular;  Laterality: N/A;     Home Medications:  Prior to Admission medications   Medication Sig Start Date End Date Taking? Authorizing Provider  albuterol (VENTOLIN HFA) 108 (90 Base) MCG/ACT inhaler INHALE 2 PUFFS BY MOUTH EVERY 4 HOURS AS NEEDED 10/06/22  Yes Jetty Duhamel D, MD  letrozole [facility]) 2.5 MG tablet Take 1 tablet by mouth once daily 10/08/22  Yes Serena Croissant, MD  Multiple Vitamin (MULTIVITAMIN) tablet Take 1 tablet by mouth daily.   Yes [provider]  pantoprazole (PROTONIX) 40 MG tablet Take 1 tablet by mouth once daily 10/08/22  Yes [name], MD  acetaminophen (TYLENOL) 325 MG tablet Take 2 tablets (650 mg total) by mouth every 4 (four) hours as needed for mild pain (or temp > 37.5 C (99.5 F)). 07/14/21   Angiulli, Mcarthur Rossetti, PA-C  ammonium lactate (LAC-HYDRIN) 12 % cream Apply 1 Application topically as needed for dry skin. 04/28/22   Ellsworth Lennox, PA-C  apixaban (ELIQUIS) 5 MG TABS tablet Take 1 tablet by mouth twice daily 07/26/22   Lanier Prude, MD  DUPIXENT 300 MG/2ML SOPN Inject 300 mg into the skin every 14  (fourteen) days. 10/30/21   [provider]  fluticasone-salmeterol (ADVAIR) 250-50 MCG/ACT AEPB INHALE 1 PUFF INTO LUNGS TWICE DAILY Patient taking differently: Inhale 1 puff into the lungs in the morning and at bedtime. 05/20/22   Jetty Duhamel D, MD  triamcinolone cream (KENALOG) 0.1 % Apply 1 Application topically 2 (two) times daily. 04/28/22   Ellsworth Lennox, PA-C    Inpatient Medications: Scheduled Meds:  mometasone-formoterol  2 puff Inhalation BID   sodium chloride flush  3 mL Intravenous Q12H   Continuous Infusions:  sodium chloride 75 mL/hr at 10/19/22 1837   amiodarone 30 mg/hr (10/20/22 1443)   heparin 1,500 Units/hr (10/20/22 1335)   piperacillin-tazobactam (ZOSYN)  IV 3.375 g (10/20/22 1249)   PRN Meds: acetaminophen **OR** acetaminophen, albuterol, hydrALAZINE, ondansetron **OR** ondansetron (ZOFRAN) IV  Allergies:    Allergies  Allergen Reactions   Clarithromycin Anaphylaxis    "just about killed me"   Aspirin Hives   Ibuprofen Hives   Codeine Nausea And Vomiting   Codeine Phosphate Nausea And Vomiting   Diltiazem Rash   Methotrexate Other (See Comments)     tongue swelling and mouth soreness   Tape Itching and Other (See Comments)    Tears skin   Tapentadol Itching    Other reaction(s): Other (See Comments) Tears skin   Verapamil Hcl Er Rash    Social History:   Social History   Socioeconomic History   Marital status: Divorced    Spouse name: Not on file   Number of children: Not on file   Years of education: Not on file   Highest education level: Not on file  Occupational History   Occupation: retired  Tobacco Use   Smoking status: Former    Years: 0    Types: Cigarettes    Quit date: 05/24/1977    Years since quitting: 45.4   Smokeless tobacco: Never   Tobacco comments:    7/7 pt states she occasionally smoked, let cigs "burn" more than she smoked them  Vaping Use   Vaping Use: Never used  Substance and Sexual Activity   Alcohol  use: No   Drug use: No   Sexual activity: Not on file  Other Topics Concern   Not on file  Social History Narrative   Right handed   1 coke per day   Lives alone   Social Determinants of Health   Financial Resource Strain: Low Risk  (11/16/2021)   Overall Financial Resource Strain (CARDIA)    Difficulty of Paying Living Expenses: Not hard at all  Food Insecurity: No Food Insecurity (10/19/2022)   Hunger Vital Sign    Worried About Running Out of Food in the Last Year: Never true    Ran Out of Food in the Last Year: Never true  Transportation Needs: No Transportation Needs (10/19/2022)   PRAPARE -  Administrator, Civil Service (Medical): No    Lack of Transportation (Non-Medical): No  Physical Activity: Insufficiently Active (11/16/2021)   Exercise Vital Sign    Days of Exercise per Week: 2 days    Minutes of Exercise per Session: 30 min  Stress: No Stress Concern Present (11/16/2021)   [employer] of Occupational Health - Occupational Stress Questionnaire    Feeling of Stress : Not at all  Social Connections: Moderately Isolated (11/16/2021)   Social Connection and Isolation Panel [NHANES]    Frequency of Communication with Friends and Family: Three times a week    Frequency of Social Gatherings with Friends and Family: Three times a week    Attends Religious Services: More than 4 times per year    Active Member of Clubs or Organizations: No    Attends Banker Meetings: Never    Marital Status: Divorced  Catering manager Violence: Not At Risk (10/19/2022)   Humiliation, Afraid, Rape, and Kick questionnaire    Fear of Current or Ex-Partner: No    Emotionally Abused: No    Physically Abused: No    Sexually Abused: No    Family History:    Family History  Problem Relation Age of Onset   COPD Father    Colon cancer Mother 37       mets to pancreas and liver   Kidney cancer Sister 80       d. 44   Lung cancer Sister        d. 100, smoker    Ovarian cancer Sister 22       d. 48   Cirrhosis Sister        d. 71   Melanoma Sister 77   Ovarian cancer Maternal Grandmother        d. 73   Heart disease Maternal Grandfather        rheumatic heart disease   Rectal cancer Paternal Grandfather        d. 1   Parkinson's disease Brother    Ovarian cancer Maternal Aunt    Colon cancer Maternal Uncle        d. 72-73   Heart attack Paternal Uncle        d. 82   Kidney cancer Brother 69   Colon cancer Maternal Uncle        d. 64   Cancer Other        MGMs pat 1/2 sister with uterine and rectal cancer   Colon cancer Cousin        d. 50 - mat first cousin   Breast cancer Neg Hx      ROS:  Please see the history of present illness.   All other ROS reviewed and negative.     Physical Exam/Data:   Vitals:   10/19/22 2300 10/20/22 0100 10/20/22 0442 10/20/22 0811  BP: 102/69 (!) 106/54 (!) 131/105   Pulse: 63 (!) 53 61   Resp: 20 (!) 22 20   Temp: 98.3 F (36.8 C)     TempSrc: Oral     SpO2: 92% 93% 93% 93%  Weight:      Height:        Intake/Output Summary (Last 24 hours) at 10/20/2022 1608 Last data filed at 10/19/2022 2000 Gross per 24 hour  Intake 60 ml  Output --  Net 60 ml      10/19/2022    4:00 PM 10/05/2022   10:53 AM 07/29/2022  9:34 AM  Last 3 Weights  Weight (lbs) 198 lb 6.6 oz 212 lb 203 lb  Weight (kg) 90 kg 96.163 kg 92.08 kg     Body mass index is 27.67 kg/m.  General:  Well nourished, well developed, in no acute distress HEENT: normal Neck: no JVD Vascular: No carotid bruits; Distal pulses 2+ bilaterally Cardiac:  normal S1, S2; RRR; no murmur  Lungs:  clear to auscultation bilaterally, no wheezing, rhonchi or rales  Abd: soft, nontender, no hepatomegaly  Ext: no edema Musculoskeletal:  No deformities, BUE and BLE strength normal and equal Skin: warm and dry  Neuro:  CNs 2-12 intact, no focal abnormalities noted Psych:  Normal affect   EKG:  The EKG was personally reviewed and  demonstrates:    5/28 at 11AM: Afib with RVR HR 155 bpm  5/28 15:41: sinus rhythm with PACs   Telemetry:  Telemetry was personally reviewed and demonstrates:  sinus with PACs  Relevant CV Studies: TTE 11/23/2021 1. Left ventricular ejection fraction, by estimation, is 60 to 65%. The  left ventricle has normal function. The left ventricle has no regional  wall motion abnormalities. There is mild left ventricular hypertrophy.  Left ventricular diastolic parameters  are consistent with Grade I diastolic dysfunction (impaired relaxation).  Elevated left atrial pressure.   2. Right ventricular systolic function is normal. The right ventricular  size is normal. There is mildly elevated pulmonary artery systolic  pressure.   3. Left atrial size was severely dilated.   4. The mitral valve is normal in structure. Trivial mitral valve  regurgitation. No evidence of mitral stenosis. Severe mitral annular  calcification.   5. The aortic valve is tricuspid. Aortic valve regurgitation is not  visualized. Aortic valve sclerosis is present, with no evidence of aortic  valve stenosis.   6. The inferior vena cava is dilated in size with >50% respiratory  variability, suggesting right atrial pressure of 8 mmHg.  LHC 11/23/2021 1) Patent coronary arteries, right dominant, with mild irregularity but no significant stenoses. 2) Normal LVEDP  Suspect demand ischemic event. The patient does not have any high-grade CAD. Recommend medical therapy. Resume heparin tonight. Likely transition to apixaban again tomorrow.    Laboratory Data:  High Sensitivity Troponin:  No results for input(s): "TROPONINIHS" in the last 720 hours.   Chemistry Recent Labs  Lab 10/19/22 1123 10/19/22 1129 10/20/22 0225 10/20/22 1143  NA 131* 134* 134*  --   K 3.3* 3.3* 3.0*  --   CL 97* 100 99  --   CO2 18*  --  22  --   GLUCOSE 152* 152* 139*  --   BUN 23 26* 19  --   CREATININE 1.64* 1.50* 1.15*  --   CALCIUM 8.8*   --  7.9*  --   MG  --   --   --  1.8  GFRNONAA 31*  --  48*  --   ANIONGAP 16*  --  13  --     Recent Labs  Lab 10/19/22 1123  PROT 7.0  ALBUMIN 3.1*  AST 36  ALT 20  ALKPHOS 77  BILITOT 1.2   Lipids No results for input(s): "CHOL", "TRIG", "HDL", "LABVLDL", "LDLCALC", "CHOLHDL" in the last 168 hours.  Hematology Recent Labs  Lab 10/19/22 1123 10/19/22 1129 10/20/22 0225 10/20/22 1143  WBC 21.6*  --  13.7* 12.4*  RBC 5.11  --  4.07 3.97  HGB 14.6 16.3* 11.9* 11.8*  HCT 45.9 48.0* 36.9  36.8  MCV 89.8  --  90.7 92.7  MCH 28.6  --  29.2 29.7  MCHC 31.8  --  32.2 32.1  RDW 13.8  --  14.1 14.0  PLT 259  --  226 200   Thyroid No results for input(s): "TSH", "FREET4" in the last 168 hours.  BNPNo results for input(s): "BNP", "PROBNP" in the last 168 hours.  DDimer No results for input(s): "DDIMER" in the last 168 hours.   Radiology/Studies:  CT ABDOMEN PELVIS W CONTRAST  Result Date: 10/19/2022 CLINICAL DATA:  Abdominal pain EXAM: CT ABDOMEN AND PELVIS WITH CONTRAST TECHNIQUE: Multidetector CT imaging of the abdomen and pelvis was performed using the standard protocol following bolus administration of intravenous contrast. RADIATION DOSE REDUCTION: This exam was performed according to the departmental dose-optimization program which includes automated exposure control, adjustment of the mA and/or kV according to patient size and/or use of iterative reconstruction technique. CONTRAST:  70mL OMNIPAQUE IOHEXOL 350 MG/ML SOLN COMPARISON:  None Available. FINDINGS: Lower chest: Heart is slightly enlarged. Significant calcifications along the mitral valve annulus. There is some linear opacity at the bases likely scar or atelectasis. 6 mm subpleural nodule left lower lobe on series 4, image 3. Small hiatal hernia. Hepatobiliary: Gallbladder is dilated with wall thickening, stones and stranding. Please correlate for acute cholecystitis. There may be a stone towards the neck as well.  Underlying fatty liver infiltration. Patent portal vein. Pancreas: Severe fatty atrophy of the pancreas. There is a cystic lesion towards the tail of the pancreas measuring 2.7 by 2.5 cm. Series 3, image 27. Spleen: Normal in size without focal abnormality. Adrenals/Urinary Tract: The adrenal glands are preserved. Bilateral small renal stones. These punctate overall except for 4 mm lower pole right-sided stone. There is cyst along the lower pole left kidney measuring 2.2 cm with Hounsfield unit of 11. Bosniak 1 lesion. Tiny focus on the right, Bosniak 2 lesion. Again no specific imaging follow-up of the cysts. The ureters have normal course and caliber extending down to the bladder. Bladder is underdistended. Stomach/Bowel: There is some fluid in the stomach. Stomach is nondilated. The small bowel is nondilated. Large bowel has a normal course and caliber. Normal appendix. Vascular/Lymphatic: Normal caliber aorta and IVC. Mild vascular calcifications. No specific abnormal lymph node enlargement identified in the abdomen and pelvis. Replaced left hepatic artery from the left gastric. Reproductive: Status post hysterectomy. No adnexal masses. Other: Small amount of free fluid dependently in the pelvis, nonspecific. No free air. Fat containing umbilical hernia. Musculoskeletal: Scattered degenerative changes along the spine with levoconvex curvature of the lower lumbar spine centered at L4. There is also multilevel trace retrolisthesis and multilevel stenosis. IMPRESSION: Abnormal gallbladder with dilatation and stones. There is significant wall thickening, edema and adjacent stranding consistent with a acute cholecystitis. Possible stone in the neck of the gallbladder as well. 2.7 cm cystic lesion along the tail of the pancreas. In the absence of prior would recommend follow up imaging in 6 months. Electronically Signed   By: Karen Kays M.D.   On: 10/19/2022 12:35   CT Head Wo Contrast  Result Date:  10/19/2022 CLINICAL DATA:  Headache, new onset. EXAM: CT HEAD WITHOUT CONTRAST TECHNIQUE: Contiguous axial images were obtained from the base of the skull through the vertex without intravenous contrast. RADIATION DOSE REDUCTION: This exam was performed according to the departmental dose-optimization program which includes automated exposure control, adjustment of the mA and/or kV according to patient size and/or use of iterative reconstruction  technique. COMPARISON:  Head CT 06/28/2021.  MRI brain 06/28/2021. FINDINGS: Brain: No acute hemorrhage. Unchanged mild chronic small-vessel disease with old lacunar infarcts in the bilateral caudate heads. Cortical gray-white differentiation is otherwise preserved. Prominence of the ventricles and sulci within expected range for age. No hydrocephalus or extra-axial collection. No mass effect or midline shift. Vascular: No hyperdense vessel or unexpected calcification. Skull: No calvarial fracture or suspicious bone lesion. Skull base is unremarkable. Sinuses/Orbits: Unremarkable. Other: None IMPRESSION: 1. No acute intracranial abnormality. 2. Unchanged mild chronic small-vessel disease. Electronically Signed   By: Orvan Falconer M.D.   On: 10/19/2022 12:31     Assessment and Plan:   Afib with RVR: in the setting of acute cholecystitis/SIRS. She has known history of afib. CHADS2vasc=5. She follows with Dr. Anne Fu. - now in sinus rhythm - recommend continued therapy for cholecystitis/SIRs.  - home eliquis is held , on heparin gtt; can restart AC once ok'd by gen surgery - will transition to oral amiodarone 400 mg BID for 10 days and amiodarone 200 mg daily thereafter  - cardiology will follow peripherally - can work on FU after discharge with an APP   Risk Assessment/Risk Scores:      CHA2DS2-VASc Score = 5   This indicates a 7.2% annual risk of stroke. The patient's score is based upon: CHF History: 0 HTN History: 0 Diabetes History: 0 Stroke History:  2 Vascular Disease History: 0 Age Score: 2 Gender Score: 1     For questions or updates, please contact [facility] Please consult [email or web address] for contact info under    Signed, Maisie Fus, MD  10/20/2022 4:08 PM

## 2022-10-20 NOTE — TOC Initial Note (Signed)
Transition of Care [facility]) - Initial/Assessment Note    Patient Details  Name: Laura Mendez MRN: [id number] Date of Birth: 10/30/1940  [facility] CM/SW Contact:    Lawerance Sabal, RN Phone Number: 10/20/2022, 5:11 PM  Clinical Narrative:                  Patient admitted from home for cholecystitis.  TOC will continue to follow for DC needs.   Expected Discharge Plan: Home/Self Care Barriers to Discharge: Continued Medical Work up   Patient Goals and CMS Choice            Expected Discharge Plan and Services                                              Prior Living Arrangements/Services                       Activities of Daily Living Home Assistive Devices/Equipment: Cane (specify quad or straight) ADL Screening (condition at time of admission) Patient's cognitive ability adequate to safely complete daily activities?: Yes Is the patient deaf or have difficulty hearing?: No Does the patient have difficulty seeing, even when wearing glasses/contacts?: No Does the patient have difficulty concentrating, remembering, or making decisions?: No Patient able to express need for assistance with ADLs?: Yes Does the patient have difficulty dressing or bathing?: No Independently performs ADLs?: Yes (appropriate for developmental age) Does the patient have difficulty walking or climbing stairs?: No Weakness of Legs: None Weakness of Arms/Hands: None  Permission Sought/Granted                  Emotional Assessment              Admission diagnosis:  Acute cholecystitis [K81.0] Patient Active Problem List   Diagnosis Date Noted   Sepsis (HCC) 10/19/2022   Acute cholecystitis 10/19/2022   Pancreatic lesion 10/19/2022   Acute cystitis with hematuria 10/05/2022   AKI (acute kidney injury) (HCC) 11/23/2021   Elevated troponin    Atopic dermatitis 10/21/2021   Seasonal allergies 10/21/2021   Actinic keratosis 10/21/2021    Lichenified eczema 10/21/2021   Asthma exacerbation 09/01/2021   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   CVA (cerebral vascular accident) (HCC) 06/28/2021   Chronic diastolic CHF (congestive heart failure) (HCC) 06/28/2021   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   Hematoma of left knee region 06/28/2021   Fall at home, initial encounter 06/28/2021   Pain in right knee 08/07/2019   Pain in right foot 03/11/2018   Malignant neoplasm of lower-outer quadrant of right breast of female, estrogen receptor positive (HCC) 11/15/2017   Diastolic dysfunction 04/28/2017   Rash and nonspecific skin eruption 04/01/2016   Paroxysmal atrial fibrillation (HCC) 01/13/2016   Retinal vein occlusion 06/16/2015   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 [place] [address] Phone: [phone] Fax:  [phone]     Social Determinants of Health (SDOH) Social History: SDOH Screenings   Food Insecurity: No Food Insecurity (10/19/2022)  Housing: Low Risk  (10/19/2022)  Transportation Needs: No Transportation Needs (10/19/2022)  Utilities: Not At Risk (10/19/2022)  Alcohol Screen: Low Risk  (11/16/2021)  Depression (PHQ2-9): Low Risk  (12/08/2021)  Financial Resource Strain: Low Risk  (11/16/2021)  Physical Activity: Insufficiently Active (11/16/2021)  Social Connections: Moderately Isolated (11/16/2021)  Stress: No Stress Concern Present (11/16/2021)  Tobacco Use: Medium Risk (10/19/2022)    SDOH Interventions:     Readmission Risk Interventions     No data to display

## 2022-10-20 NOTE — Progress Notes (Signed)
Patient ID: Laura Mendez, female   DOB: 1941/05/18, 82 y.o.   MRN: [id number]      Subjective: Patient denies abdominal pain and is tolerating FLD. Having diarrhea still and feels a little queasy at times. No family at bedside this AM.   Objective: Vital signs in last 24 hours: Temp:  [97.9 F (36.6 C)] 97.9 F (36.6 C) (05/29 2357) Pulse Rate:  [63-90] 89 (05/30 0800) Resp:  [18-20] 18 (05/30 0800) BP: (105-112)/(57-63) 112/63 (05/30 0800) SpO2:  [91 %-100 %] 94 % (05/30 0818) Last BM Date : (S) 10/21/22  Intake/Output from previous day: 05/29 0701 - 05/30 0700 In: 2473.1 [P.O.:120; I.V.:2130.2; IV Piggyback:222.9] Out: 1500 [Urine:1500] Intake/Output this shift: Total I/O In: 184.2 [P.O.:120; I.V.:51.7; IV Piggyback:12.4] Out: -   General: pleasant, WD, elderly female who is laying in bed in NAD HEENT: sclera anicteric  Heart: irregularly irregular Lungs: Respiratory effort nonlabored Abd: soft, NT on palpation of abdomen but did have a positive murphy sign today, ND, +BS MS: all 4 extremities are symmetrical with no cyanosis, clubbing, or edema. Psych: A&Ox3 with an appropriate affect.   Lab Results: CBC  Recent Labs    10/21/22 0207 10/21/22 0640  WBC 8.8 11.1*  HGB 9.7* 11.5*  HCT 31.8* 36.5  PLT 156 192   BMET Recent Labs    10/20/22 0225 10/21/22 0640  NA 134* 136  K 3.0* 3.3*  CL 99 101  CO2 22 23  GLUCOSE 139* 113*  BUN 19 10  CREATININE 1.15* 0.97  CALCIUM 7.9* 8.2*   PT/INR Recent Labs    10/19/22 1123  LABPROT 22.6*  INR 2.0*   ABG No results for input(s): "PHART", "HCO3" in the last 72 hours.  Invalid input(s): "PCO2", "PO2"  Studies/Results: CT ABDOMEN PELVIS W CONTRAST  Result Date: 10/19/2022 CLINICAL DATA:  Abdominal pain EXAM: CT ABDOMEN AND PELVIS WITH CONTRAST TECHNIQUE: Multidetector CT imaging of the abdomen and pelvis was performed using the standard protocol following bolus administration of intravenous  contrast. RADIATION DOSE REDUCTION: This exam was performed according to the departmental dose-optimization program which includes automated exposure control, adjustment of the mA and/or kV according to patient size and/or use of iterative reconstruction technique. CONTRAST:  70mL OMNIPAQUE IOHEXOL 350 MG/ML SOLN COMPARISON:  None Available. FINDINGS: Lower chest: Heart is slightly enlarged. Significant calcifications along the mitral valve annulus. There is some linear opacity at the bases likely scar or atelectasis. 6 mm subpleural nodule left lower lobe on series 4, image 3. Small hiatal hernia. Hepatobiliary: Gallbladder is dilated with wall thickening, stones and stranding. Please correlate for acute cholecystitis. There may be a stone towards the neck as well. Underlying fatty liver infiltration. Patent portal vein. Pancreas: Severe fatty atrophy of the pancreas. There is a cystic lesion towards the tail of the pancreas measuring 2.7 by 2.5 cm. Series 3, image 27. Spleen: Normal in size without focal abnormality. Adrenals/Urinary Tract: The adrenal glands are preserved. Bilateral small renal stones. These punctate overall except for 4 mm lower pole right-sided stone. There is cyst along the lower pole left kidney measuring 2.2 cm with Hounsfield unit of 11. Bosniak 1 lesion. Tiny focus on the right, Bosniak 2 lesion. Again no specific imaging follow-up of the cysts. The ureters have normal course and caliber extending down to the bladder. Bladder is underdistended. Stomach/Bowel: There is some fluid in the stomach. Stomach is nondilated. The small bowel is nondilated. Large bowel has a normal course and caliber. Normal appendix. Vascular/Lymphatic:  Normal caliber aorta and IVC. Mild vascular calcifications. No specific abnormal lymph node enlargement identified in the abdomen and pelvis. Replaced left hepatic artery from the left gastric. Reproductive: Status post hysterectomy. No adnexal masses. Other: Small  amount of free fluid dependently in the pelvis, nonspecific. No free air. Fat containing umbilical hernia. Musculoskeletal: Scattered degenerative changes along the spine with levoconvex curvature of the lower lumbar spine centered at L4. There is also multilevel trace retrolisthesis and multilevel stenosis. IMPRESSION: Abnormal gallbladder with dilatation and stones. There is significant wall thickening, edema and adjacent stranding consistent with a acute cholecystitis. Possible stone in the neck of the gallbladder as well. 2.7 cm cystic lesion along the tail of the pancreas. In the absence of prior would recommend follow up imaging in 6 months. Electronically Signed   By: Karen Kays M.D.   On: 10/19/2022 12:35   CT Head Wo Contrast  Result Date: 10/19/2022 CLINICAL DATA:  Headache, new onset. EXAM: CT HEAD WITHOUT CONTRAST TECHNIQUE: Contiguous axial images were obtained from the base of the skull through the vertex without intravenous contrast. RADIATION DOSE REDUCTION: This exam was performed according to the departmental dose-optimization program which includes automated exposure control, adjustment of the mA and/or kV according to patient size and/or use of iterative reconstruction technique. COMPARISON:  Head CT 06/28/2021.  MRI brain 06/28/2021. FINDINGS: Brain: No acute hemorrhage. Unchanged mild chronic small-vessel disease with old lacunar infarcts in the bilateral caudate heads. Cortical gray-white differentiation is otherwise preserved. Prominence of the ventricles and sulci within expected range for age. No hydrocephalus or extra-axial collection. No mass effect or midline shift. Vascular: No hyperdense vessel or unexpected calcification. Skull: No calvarial fracture or suspicious bone lesion. Skull base is unremarkable. Sinuses/Orbits: Unremarkable. Other: None IMPRESSION: 1. No acute intracranial abnormality. 2. Unchanged mild chronic small-vessel disease. Electronically Signed   By: Orvan Falconer M.D.   On: 10/19/2022 12:31    Anti-infectives: Anti-infectives (From admission, onward)    Start     Dose/Rate Route Frequency Ordered Stop   10/19/22 1900  piperacillin-tazobactam (ZOSYN) IVPB 3.375 g        3.375 g 12.5 mL/hr over 240 Minutes Intravenous Every 8 hours 10/19/22 1459     10/19/22 1445  piperacillin-tazobactam (ZOSYN) IVPB 3.375 g  Status:  Discontinued        3.375 g 100 mL/hr over 30 Minutes Intravenous Every 8 hours 10/19/22 1444 10/19/22 1459   10/19/22 1300  piperacillin-tazobactam (ZOSYN) IVPB 3.375 g        3.375 g 100 mL/hr over 30 Minutes Intravenous  Once 10/19/22 1252 10/19/22 1348       Assessment/Plan: Possible cholecystitis  - CT AP with gallbladder wall thickening, cholelithiasis and pericholecystic stranding, possible stone at the neck of the gallbladder - WBC down to 11.1 from 21 on admit - tolerating PO without pain but positive Murphy sign today - IR consult for percutaneous cholecystostomy, not urgent and could be done tomorrow but I think in setting of A. Fib with RVR and acute diarrhea with degree on inflammation seen on initial imaging would recommend source control with drain and then discussion of interval lap chole in 6-8 weeks  AF RVR - on heparin gtt, per primary and cards   FEN:  FLD VTE: hold eliquis, on heparin drip ID: Zosyn 5/28>>   LOS: 2 days   Juliet Rude, [facility] 10/21/2022, 10:33 AM Please see Amion for pager number during day hours 7:00am-4:30pm

## 2022-10-20 NOTE — Progress Notes (Addendum)
Called by nursing, patient a bit more confused tonight.  Heart rate rebounded to 140s to 150s.  Amiodarone drip restarted.

## 2022-10-20 NOTE — Progress Notes (Signed)
ANTICOAGULATION CONSULT NOTE - Follow Up Consult  Pharmacy Consult for heparin Indication: atrial fibrillation  Height :  31ft 11in. Weight: 90 kg Heparin dosing weight: 88.9 kg  Labs: Recent Labs    10/19/22 1123 10/19/22 1129 10/20/22 0225 10/20/22 1143  HGB 14.6 16.3* 11.9* 11.8*  HCT 45.9 48.0* 36.9 36.8  PLT 259  --  226 200  APTT  --   --  58* 50*  LABPROT 22.6*  --   --   --   INR 2.0*  --   --   --   HEPARINUNFRC  --   --  >1.10*  --   CREATININE 1.64* 1.50* 1.15*  --      Assessment: 82yo female ,  aPTT = 50 seconds, subtherapeutic on IV Heparin infusion at 1300 units/hr,  with initial dosing while DOAC on hold; no infusion issues or overt signs of bleeding per RN. Noted that Hgb has dropped (14.6>16.3>11.9> 11.8).  Next Hgb 11.8 low/stable.  Goal of Therapy:  aPTT 66-102 seconds   Plan:  Increase Heparin infusion to 1500 units/hr  Check PTT in 8 hours.  Daily aPTT, HL , CBC   Thank you for allowing pharmacy to be part of this patients care team.  Noah Delaine, RPh Clinical Pharmacist  10/20/2022 12:50 PM

## 2022-10-20 NOTE — Progress Notes (Signed)
ANTICOAGULATION CONSULT NOTE - Follow Up Consult  Pharmacy Consult for heparin Indication: atrial fibrillation  Height :  20ft 11in. Weight: 90 kg Heparin dosing weight: 88.9 kg  Labs: Recent Labs    10/19/22 1123 10/19/22 1129 10/20/22 0225 10/20/22 1143 10/20/22 2145  HGB 14.6 16.3* 11.9* 11.8*  --   HCT 45.9 48.0* 36.9 36.8  --   PLT 259  --  226 200  --   APTT  --   --  58* 50* 89*  LABPROT 22.6*  --   --   --   --   INR 2.0*  --   --   --   --   HEPARINUNFRC  --   --  >1.10*  --   --   CREATININE 1.64* 1.50* 1.15*  --   --      Assessment: Laura Mendez ,    IV Heparin infusion at 1500 units/hr with aptt 89 sec at goal no infusion issues or overt signs of bleeding per RN. Noted that Hgb has dropped (14.6>16.3>11.9> 11.8) low/stable.  Goal of Therapy:  aPTT 66-102 seconds   Plan:  Continue Heparin infusion  1500 units/hr  Daily aPTT, HL , CBC    [name] Pharm.D. CPP, BCPS Clinical Pharmacist [phone] 10/20/2022 10:31 PM

## 2022-10-20 NOTE — Progress Notes (Signed)
PROGRESS NOTE  Jaren Lowenthal  ZOX:[id number] DOB: March 04, 1941 DOA: 10/19/2022 PCP: [name], MD   Brief Narrative: Patient is a 82 year old female with history of breast cancer, chronic diastolic CHF, A-fib, CVA who presented with complaint of diarrhea, nausea, vomiting , abdominal pain.  On presentation, she was in A-fib with RVR with heart rate in the range of 150.  CT abdomen/pelvis was consistent of cholecystitis, general surgery consulted.  Assessment & Plan:  Principal Problem:   Acute cholecystitis Active Problems:   Asthma with COPD (HCC)   Paroxysmal atrial fibrillation (HCC)   Malignant neoplasm of lower-outer quadrant of right breast of female, estrogen receptor positive (HCC)   CVA (cerebral vascular accident) (HCC)   Atopic dermatitis   AKI (acute kidney injury) (HCC)   Pancreatic lesion   Acute cholecystitis: Presented with nausea, vomiting, abdominal pain.  Lab work showed leukocytosis with WBC count of 21,000, AKI with creatinine of 1.6, normal liver enzymes. CT abdomen/pelvis showed abnormal gallbladder with dilatation and stones,significant wall thickening, edema and adjacent stranding consistent with  acute cholecystitis. Possible stone in the neck of the gallbladder . General surgery consulted.  Continue antibiotics.  Follow-up cultures.  Leukocytosis improving.  Lactic acidosis has resolved  A-fib with RVR: Takes Eliquis at home.  Heart rate in the range of 115 presentation.  Started on amiodarone drip.  Cardiology consulted today.On eliquis at home,now on iv heparin  AKI: Presented with creatinine in the range of 1.6.  Kidney function almost normalized with IV fluid.  Hypokalemia: Currently being supplemented.  History of CVA: History of mild to moderate dementia.  Continue delirium precautions  Asthma/COPD: Continue bronchodilators as needed  Atopic dermatitis: On Dupixent at home  History of breast cancer: S/p lumpectomy.  On letrozole  at home.   Pancreatic lesion: 2.7 cm cystic lesion seen along the tail of the pancreas. Recommended follow up imaging in 6 months.         DVT prophylaxis:iv heparin     Code Status: Full Code  Family Communication: Discussed with son at bedside  Patient status:Inpatient  Patient is from :home  Anticipated discharge WJ:XBJY  Estimated DC date:not sure   Consultants: Surgery,cardiology  Procedures:None  Antimicrobials:  Anti-infectives (From admission, onward)    Start     Dose/Rate Route Frequency Ordered Stop   10/19/22 1900  piperacillin-tazobactam (ZOSYN) IVPB 3.375 g        3.375 g 12.5 mL/hr over 240 Minutes Intravenous Every 8 hours 10/19/22 1459     10/19/22 1445  piperacillin-tazobactam (ZOSYN) IVPB 3.375 g  Status:  Discontinued        3.375 g 100 mL/hr over 30 Minutes Intravenous Every 8 hours 10/19/22 1444 10/19/22 1459   10/19/22 1300  piperacillin-tazobactam (ZOSYN) IVPB 3.375 g        3.375 g 100 mL/hr over 30 Minutes Intravenous  Once 10/19/22 1252 10/19/22 1348       Subjective: Patient seen and examined at bedside today.  Hemodynamically stable.  Heart rate has been controlled this afternoon.  Monitor shows A-fib rhythm.  Denies any abdominal pain.  Tolerating full liquid diet.  Had a bowel movement this morning  Objective: Vitals:   10/19/22 2300 10/20/22 0100 10/20/22 0442 10/20/22 0811  BP: 102/69 (!) 106/54 (!) 131/105   Pulse: 63 (!) 53 61   Resp: 20 (!) 22 20   Temp: 98.3 F (36.8 C)     TempSrc: Oral     SpO2: 92% 93% 93% 93%  Weight:      Height:        Intake/Output Summary (Last 24 hours) at 10/20/2022 1353 Last data filed at 10/19/2022 2000 Gross per 24 hour  Intake 60 ml  Output --  Net 60 ml   Filed Weights   10/19/22 1600  Weight: 90 kg    Examination:  General exam: Overall comfortable, not in distress HEENT: PERRL Respiratory system:  no wheezes or crackles  Cardiovascular system: Irregularly irregular  rhythm.  Gastrointestinal system: Abdomen is nondistended, soft and nontender. Central nervous system: Alert and oriented Extremities: No edema, no clubbing ,no cyanosis Skin: No rashes, no ulcers,no icterus     Data Reviewed: I have personally reviewed following labs and imaging studies  CBC: Recent Labs  Lab 10/19/22 1123 10/19/22 1129 10/20/22 0225 10/20/22 1143  WBC 21.6*  --  13.7* 12.4*  NEUTROABS 18.3*  --   --   --   HGB 14.6 16.3* 11.9* 11.8*  HCT 45.9 48.0* 36.9 36.8  MCV 89.8  --  90.7 92.7  PLT 259  --  226 200   Basic Metabolic Panel: Recent Labs  Lab 10/19/22 1123 10/19/22 1129 10/20/22 0225 10/20/22 1143  NA 131* 134* 134*  --   K 3.3* 3.3* 3.0*  --   CL 97* 100 99  --   CO2 18*  --  22  --   GLUCOSE 152* 152* 139*  --   BUN 23 26* 19  --   CREATININE 1.64* 1.50* 1.15*  --   CALCIUM 8.8*  --  7.9*  --   MG  --   --   --  1.8     Recent Results (from the past 240 hour(s))  Blood culture (routine x 2)     Status: None (Preliminary result)   Collection Time: 10/19/22  1:01 PM   Specimen: BLOOD  Result Value Ref Range Status   Specimen Description BLOOD SITE NOT SPECIFIED  Final   Special Requests   Final    BOTTLES DRAWN AEROBIC AND ANAEROBIC Blood Culture adequate volume   Culture   Final    NO GROWTH < 24 HOURS Performed at [facility], [address], [place], [place] [address]    Report Status PENDING  Incomplete  Blood culture (routine x 2)     Status: None (Preliminary result)   Collection Time: 10/19/22  1:06 PM   Specimen: BLOOD LEFT ARM  Result Value Ref Range Status   Specimen Description BLOOD LEFT ARM  Final   Special Requests   Final    BOTTLES DRAWN AEROBIC AND ANAEROBIC Blood Culture adequate volume   Culture   Final    NO GROWTH < 24 HOURS Performed at [facility], [address], [place], [place] [address]    Report Status PENDING  Incomplete     Radiology Studies: CT ABDOMEN PELVIS W CONTRAST  Result  Date: 10/19/2022 CLINICAL DATA:  Abdominal pain EXAM: CT ABDOMEN AND PELVIS WITH CONTRAST TECHNIQUE: Multidetector CT imaging of the abdomen and pelvis was performed using the standard protocol following bolus administration of intravenous contrast. RADIATION DOSE REDUCTION: This exam was performed according to the departmental dose-optimization program which includes automated exposure control, adjustment of the mA and/or kV according to patient size and/or use of iterative reconstruction technique. CONTRAST:  70mL OMNIPAQUE IOHEXOL 350 MG/ML SOLN COMPARISON:  None Available. FINDINGS: Lower chest: Heart is slightly enlarged. Significant calcifications along the mitral valve annulus. There is  some linear opacity at the bases likely scar or atelectasis. 6 mm subpleural nodule left lower lobe on series 4, image 3. Small hiatal hernia. Hepatobiliary: Gallbladder is dilated with wall thickening, stones and stranding. Please correlate for acute cholecystitis. There may be a stone towards the neck as well. Underlying fatty liver infiltration. Patent portal vein. Pancreas: Severe fatty atrophy of the pancreas. There is a cystic lesion towards the tail of the pancreas measuring 2.7 by 2.5 cm. Series 3, image 27. Spleen: Normal in size without focal abnormality. Adrenals/Urinary Tract: The adrenal glands are preserved. Bilateral small renal stones. These punctate overall except for 4 mm lower pole right-sided stone. There is cyst along the lower pole left kidney measuring 2.2 cm with Hounsfield unit of 11. Bosniak 1 lesion. Tiny focus on the right, Bosniak 2 lesion. Again no specific imaging follow-up of the cysts. The ureters have normal course and caliber extending down to the bladder. Bladder is underdistended. Stomach/Bowel: There is some fluid in the stomach. Stomach is nondilated. The small bowel is nondilated. Large bowel has a normal course and caliber. Normal appendix. Vascular/Lymphatic: Normal caliber aorta and  IVC. Mild vascular calcifications. No specific abnormal lymph node enlargement identified in the abdomen and pelvis. Replaced left hepatic artery from the left gastric. Reproductive: Status post hysterectomy. No adnexal masses. Other: Small amount of free fluid dependently in the pelvis, nonspecific. No free air. Fat containing umbilical hernia. Musculoskeletal: Scattered degenerative changes along the spine with levoconvex curvature of the lower lumbar spine centered at L4. There is also multilevel trace retrolisthesis and multilevel stenosis. IMPRESSION: Abnormal gallbladder with dilatation and stones. There is significant wall thickening, edema and adjacent stranding consistent with a acute cholecystitis. Possible stone in the neck of the gallbladder as well. 2.7 cm cystic lesion along the tail of the pancreas. In the absence of prior would recommend follow up imaging in 6 months. Electronically Signed   By: Karen Kays M.D.   On: 10/19/2022 12:35   CT Head Wo Contrast  Result Date: 10/19/2022 CLINICAL DATA:  Headache, new onset. EXAM: CT HEAD WITHOUT CONTRAST TECHNIQUE: Contiguous axial images were obtained from the base of the skull through the vertex without intravenous contrast. RADIATION DOSE REDUCTION: This exam was performed according to the departmental dose-optimization program which includes automated exposure control, adjustment of the mA and/or kV according to patient size and/or use of iterative reconstruction technique. COMPARISON:  Head CT 06/28/2021.  MRI brain 06/28/2021. FINDINGS: Brain: No acute hemorrhage. Unchanged mild chronic small-vessel disease with old lacunar infarcts in the bilateral caudate heads. Cortical gray-white differentiation is otherwise preserved. Prominence of the ventricles and sulci within expected range for age. No hydrocephalus or extra-axial collection. No mass effect or midline shift. Vascular: No hyperdense vessel or unexpected calcification. Skull: No calvarial  fracture or suspicious bone lesion. Skull base is unremarkable. Sinuses/Orbits: Unremarkable. Other: None IMPRESSION: 1. No acute intracranial abnormality. 2. Unchanged mild chronic small-vessel disease. Electronically Signed   By: Orvan Falconer M.D.   On: 10/19/2022 12:31    Scheduled Meds:  mometasone-formoterol  2 puff Inhalation BID   sodium chloride flush  3 mL Intravenous Q12H   Continuous Infusions:  sodium chloride 75 mL/hr at 10/19/22 1837   amiodarone 30 mg/hr (10/19/22 1949)   heparin 1,500 Units/hr (10/20/22 1335)   piperacillin-tazobactam (ZOSYN)  IV 3.375 g (10/20/22 1249)   potassium chloride 10 mEq (10/20/22 1335)     LOS: 1 day   Burnadette Pop, MD [facility] P5/29/2024,  1:53 PM

## 2022-10-20 NOTE — Plan of Care (Signed)
  Problem: Education: Goal: Knowledge of disease or condition will improve Outcome: Not Progressing Goal: Understanding of medication regimen will improve Outcome: Not Progressing   Problem: Activity: Goal: Ability to tolerate increased activity will improve Outcome: Not Progressing   Problem: Education: Goal: Knowledge of General Education information will improve Description: Including pain rating scale, medication(s)/side effects and non-pharmacologic comfort measures Outcome: Not Progressing   Problem: Health Behavior/Discharge Planning: Goal: Ability to manage health-related needs will improve Outcome: Not Progressing   Problem: Clinical Measurements: Goal: Respiratory complications will improve Outcome: Not Progressing Goal: Cardiovascular complication will be avoided Outcome: Not Progressing   Problem: Nutrition: Goal: Adequate nutrition will be maintained Outcome: Not Progressing

## 2022-10-20 NOTE — Progress Notes (Addendum)
ANTICOAGULATION CONSULT NOTE - Follow Up Consult  Pharmacy Consult for heparin Indication: atrial fibrillation  Labs: Recent Labs    10/19/22 1123 10/19/22 1129 10/20/22 0225  HGB 14.6 16.3* 11.9*  HCT 45.9 48.0* 36.9  PLT 259  --  226  APTT  --   --  58*  LABPROT 22.6*  --   --   INR 2.0*  --   --   HEPARINUNFRC  --   --  >1.10*  CREATININE 1.64* 1.50* 1.15*    Assessment: 82yo female subtherapeutic on heparin with initial dosing while DOAC on hold; no infusion issues or overt signs of bleeding per RN but noted that Hgb has dropped (14.6>16.3>11.9).  Goal of Therapy:  aPTT 66-102 seconds   Plan:  Continue heparin infusion at 1300 units/hr and recheck PTT/CBC later this am.  Vernard Gambles, PharmD, BCPS 10/20/2022 4:36 AM

## 2022-10-21 ENCOUNTER — Inpatient Hospital Stay (HOSPITAL_COMMUNITY): Payer: Medicare Other

## 2022-10-21 DIAGNOSIS — K810 Acute cholecystitis: Secondary | ICD-10-CM | POA: Diagnosis not present

## 2022-10-21 HISTORY — PX: IR PERC CHOLECYSTOSTOMY: IMG2326

## 2022-10-21 LAB — CBC
HCT: 31.8 % — ABNORMAL LOW (ref 36.0–46.0)
HCT: 36.5 % (ref 36.0–46.0)
Hemoglobin: 9.7 g/dL — ABNORMAL LOW (ref 12.0–15.0)
Hemoglobin: 11.5 g/dL — ABNORMAL LOW (ref 12.0–15.0)
MCH: 29 pg (ref 26.0–34.0)
MCH: 28.6 pg (ref 26.0–34.0)
MCHC: 30.5 g/dL (ref 30.0–36.0)
MCHC: 31.5 g/dL (ref 30.0–36.0)
MCV: 95.2 fL (ref 80.0–100.0)
MCV: 90.8 fL (ref 80.0–100.0)
Platelets: 156 10*3/uL (ref 150–400)
Platelets: 192 10*3/uL (ref 150–400)
RBC: 3.34 MIL/uL — ABNORMAL LOW (ref 3.87–5.11)
RBC: 4.02 MIL/uL (ref 3.87–5.11)
RDW: 14.4 % (ref 11.5–15.5)
RDW: 14.3 % (ref 11.5–15.5)
WBC: 8.8 10*3/uL (ref 4.0–10.5)
WBC: 11.1 10*3/uL — ABNORMAL HIGH (ref 4.0–10.5)
nRBC: 0 % (ref 0.0–0.2)
nRBC: 0 % (ref 0.0–0.2)

## 2022-10-21 LAB — GLUCOSE, CAPILLARY: Glucose-Capillary: 105 mg/dL — ABNORMAL HIGH (ref 70–99)

## 2022-10-21 LAB — BASIC METABOLIC PANEL
Anion gap: 12 (ref 5–15)
BUN: 10 mg/dL (ref 8–23)
CO2: 23 mmol/L (ref 22–32)
Calcium: 8.2 mg/dL — ABNORMAL LOW (ref 8.9–10.3)
Chloride: 101 mmol/L (ref 98–111)
Creatinine, Ser: 0.97 mg/dL (ref 0.44–1.00)
GFR, Estimated: 58 mL/min — ABNORMAL LOW (ref >60)
Glucose, Bld: 113 mg/dL — ABNORMAL HIGH (ref 70–99)
Potassium: 3.3 mmol/L — ABNORMAL LOW (ref 3.5–5.1)
Sodium: 136 mmol/L (ref 135–145)

## 2022-10-21 LAB — APTT
aPTT: 157 seconds — ABNORMAL HIGH (ref 24–36)
aPTT: 70 seconds — ABNORMAL HIGH (ref 24–36)

## 2022-10-21 LAB — OCCULT BLOOD X 1 CARD TO LAB, STOOL: Fecal Occult Bld: POSITIVE — AB

## 2022-10-21 LAB — C DIFFICILE QUICK SCREEN W PCR REFLEX
C Diff antigen: NEGATIVE
C Diff interpretation: NOT DETECTED
C Diff toxin: NEGATIVE

## 2022-10-21 LAB — HEPARIN LEVEL (UNFRACTIONATED): Heparin Unfractionated: 0.86 IU/mL — ABNORMAL HIGH (ref 0.30–0.70)

## 2022-10-21 MED ORDER — LIDOCAINE HCL 1 % IJ SOLN
INTRAMUSCULAR | Status: AC
  Filled 2022-10-21: qty 20

## 2022-10-21 MED ORDER — MIDAZOLAM HCL 2 MG/2ML IJ SOLN
INTRAMUSCULAR | Status: AC | PRN
  Administered 2022-10-21: .5 mg via INTRAVENOUS

## 2022-10-21 MED ORDER — SODIUM CHLORIDE 0.9% FLUSH: 5.0000 mL | Freq: Three times a day (TID) | INTRAVENOUS | Status: DC

## 2022-10-21 MED ORDER — LIDOCAINE HCL 1 % IJ SOLN
20.0000 mL | Freq: Once | INTRAMUSCULAR | Status: AC
  Administered 2022-10-21: 10 mL via INTRADERMAL

## 2022-10-21 MED ORDER — SODIUM CHLORIDE 0.9% FLUSH
5.0000 mL | Freq: Three times a day (TID) | INTRAVENOUS | Status: DC
  Administered 2022-10-21 – 2022-10-27 (×13): 5 mL

## 2022-10-21 MED ORDER — FENTANYL CITRATE (PF) 100 MCG/2ML IJ SOLN
INTRAMUSCULAR | Status: AC | PRN
  Administered 2022-10-21: 25 ug via INTRAVENOUS

## 2022-10-21 MED ORDER — FENTANYL CITRATE (PF) 100 MCG/2ML IJ SOLN
INTRAMUSCULAR | Status: AC
  Filled 2022-10-21: qty 2

## 2022-10-21 MED ORDER — MIDAZOLAM HCL 2 MG/2ML IJ SOLN
INTRAMUSCULAR | Status: AC
  Filled 2022-10-21: qty 2

## 2022-10-21 MED ORDER — IOHEXOL 300 MG/ML  SOLN
50.0000 mL | Freq: Once | INTRAMUSCULAR | Status: AC | PRN
  Administered 2022-10-21: 10 mL

## 2022-10-21 MED ORDER — POTASSIUM CHLORIDE 10 MEQ/100ML IV SOLN
10.0000 meq | INTRAVENOUS | Status: AC
  Administered 2022-10-21 (×4): 10 meq via INTRAVENOUS
  Filled 2022-10-21 (×4): qty 100

## 2022-10-21 NOTE — Sedation Documentation (Signed)
60ml tan sanguinous thick fluid removed and sent to lab

## 2022-10-21 NOTE — Procedures (Signed)
Interventional Radiology Procedure:   Indications: Acute cholecystitis  Procedure: Percutaneous cholecystostomy tube placement  Findings: 10 Fr drain placed and 60 ml of cloudy orange fluid removed.  Stone filled gallbladder with patent cystic duct.   Complications: None     EBL: Minimal  Plan: Send fluid for culture.   Aris Moman R. Lowella Dandy, MD  Pager: [phone]

## 2022-10-21 NOTE — Consult Note (Signed)
Chief Complaint: Patient was seen in consultation today for perc chole tube   Referring Physician(s): Trixie Deis PA-C Violeta Gelinas, MD  Supervising Physician: Richarda Overlie  Patient Status: [facility] - In-pt  History of Present Illness: Laura Mendez is a 82 y.o. female admitted with A.fib with RVR and leukocytosis Her workup findings are consistent with acute cholecystitis. Surgery has seen pt and recommended perc chole drain as she is not a great operative candidate at present time due to acute illness and a.fib issues. PMHx, meds, labs, imaging, allergies reviewed. Currently on hep gtt Feels some better but still having RUQ pain. Has been NPO today since about 0800 Spoke to son on phone as well.   Past Medical History:  Diagnosis Date   Asthma    Breast cancer (HCC)    Chronic airway obstruction, not elsewhere classified    Diastolic dysfunction without heart failure    Disorder of bone and cartilage, unspecified    Dysrhythmia    A-Fib   Family history of colon cancer    Family history of kidney cancer    Family history of ovarian cancer    Paroxysmal atrial flutter (HCC)    Stroke (HCC)    TIA   TIA (transient ischemic attack)    Unspecified asthma(493.90)     Past Surgical History:  Procedure Laterality Date   ABDOMINAL HYSTERECTOMY     BREAST LUMPECTOMY Right 12/30/2017   BREAST LUMPECTOMY WITH RADIOACTIVE SEED LOCALIZATION Right 12/30/2017   Procedure: BREAST LUMPECTOMY WITH RADIOACTIVE SEED LOCALIZATION X'S 3;  Surgeon: Emelia Loron, MD;  Location: [facility] OR;  Service: General;  Laterality: Right;   BUNIONECTOMY Bilateral    EYE SURGERY Bilateral    cataract removal   LEFT HEART CATH AND CORONARY ANGIOGRAPHY N/A 11/23/2021   Procedure: LEFT HEART CATH AND CORONARY ANGIOGRAPHY;  Surgeon: Tonny Bollman, MD;  Location: [facility] INVASIVE CV LAB;  Service: Cardiovascular;  Laterality: N/A;    Allergies: Clarithromycin, Aspirin, Ibuprofen, Codeine,  Codeine phosphate, Diltiazem, Methotrexate, Tape, Tapentadol, and Verapamil hcl er  Medications:  Current Facility-Administered Medications:    0.9 %  sodium chloride infusion, , Intravenous, Continuous, Jonah Blue, MD, Paused at 10/21/22 0421   acetaminophen (TYLENOL) tablet 650 mg, 650 mg, Oral, Q6H PRN, 650 mg at 10/20/22 1938 **OR** acetaminophen (TYLENOL) suppository 650 mg, 650 mg, Rectal, Q6H PRN, Jonah Blue, MD   albuterol (PROVENTIL) (2.5 MG/3ML) 0.083% nebulizer solution 3 mL, 3 mL, Inhalation, Q4H PRN, Jonah Blue, MD, 3 mL at 10/20/22 2015   [EXPIRED] amiodarone (NEXTERONE PREMIX) 360-4.14 MG/200ML-% (1.8 mg/mL) IV infusion, 60 mg/hr, Intravenous, Continuous, Stopped at 10/21/22 0416 **FOLLOWED BY** amiodarone (NEXTERONE PREMIX) 360-4.14 MG/200ML-% (1.8 mg/mL) IV infusion, 30 mg/hr, Intravenous, Continuous, Crosley, Debby, MD, Last Rate: 16.67 mL/hr at 10/21/22 0810, 30 mg/hr at 10/21/22 0810   heparin ADULT infusion 100 units/mL (25000 units/289mL), 1,500 Units/hr, Intravenous, Continuous, Tamera Reason, RPH, Last Rate: 15 mL/hr at 10/21/22 0800, 1,500 Units/hr at 10/21/22 0800   hydrALAZINE (APRESOLINE) injection 5 mg, 5 mg, Intravenous, Q4H PRN, Jonah Blue, MD   mometasone-formoterol [facility]) 200-5 MCG/ACT inhaler 2 puff, 2 puff, Inhalation, BID, Jonah Blue, MD, 2 puff at 10/21/22 0818   ondansetron (ZOFRAN) tablet 4 mg, 4 mg, Oral, Q6H PRN, 4 mg at 10/21/22 0418 **OR** ondansetron (ZOFRAN) injection 4 mg, 4 mg, Intravenous, Q6H PRN, Jonah Blue, MD   piperacillin-tazobactam (ZOSYN) IVPB 3.375 g, 3.375 g, Intravenous, Q8H, Jonah Blue, MD, Last Rate: 12.5 mL/hr at 10/21/22 0800, Infusion Verify  at 10/21/22 0800   sodium chloride flush (NS) 0.9 % injection 3 mL, 3 mL, Intravenous, Q12H, Jonah Blue, MD, 3 mL at 10/21/22 1914    Family History  Problem Relation Age of Onset   COPD Father    Colon cancer Mother 37       mets to pancreas and liver    Kidney cancer Sister 22       d. 72   Lung cancer Sister        d. 45, smoker   Ovarian cancer Sister 37       d. 55   Cirrhosis Sister        d. 55   Melanoma Sister 51   Ovarian cancer Maternal Grandmother        d. 77   Heart disease Maternal Grandfather        rheumatic heart disease   Rectal cancer Paternal Grandfather        d. 48   Parkinson's disease Brother    Ovarian cancer Maternal Aunt    Colon cancer Maternal Uncle        d. 72-73   Heart attack Paternal Uncle        d. 23   Kidney cancer Brother 81   Colon cancer Maternal Uncle        d. 21   Cancer Other        MGMs pat 1/2 sister with uterine and rectal cancer   Colon cancer Cousin        d. 62 - mat first cousin   Breast cancer Neg Hx     Social History   Socioeconomic History   Marital status: Divorced    Spouse name: Not on file   Number of children: Not on file   Years of education: Not on file   Highest education level: Not on file  Occupational History   Occupation: retired  Tobacco Use   Smoking status: Former    Years: 0    Types: Cigarettes    Quit date: 05/24/1977    Years since quitting: 45.4   Smokeless tobacco: Never   Tobacco comments:    7/7 pt states she occasionally smoked, let cigs "burn" more than she smoked them  Vaping Use   Vaping Use: Never used  Substance and Sexual Activity   Alcohol use: No   Drug use: No   Sexual activity: Not on file  Other Topics Concern   Not on file  Social History Narrative   Right handed   1 coke per day   Lives alone   Social Determinants of Health   Financial Resource Strain: Low Risk  (11/16/2021)   Overall Financial Resource Strain (CARDIA)    Difficulty of Paying Living Expenses: Not hard at all  Food Insecurity: No Food Insecurity (10/19/2022)   Hunger Vital Sign    Worried About Running Out of Food in the Last Year: Never true    Ran Out of Food in the Last Year: Never true  Transportation Needs: No Transportation Needs  (10/19/2022)   PRAPARE - Administrator, Civil Service (Medical): No    Lack of Transportation (Non-Medical): No  Physical Activity: Insufficiently Active (11/16/2021)   Exercise Vital Sign    Days of Exercise per Week: 2 days    Minutes of Exercise per Session: 30 min  Stress: No Stress Concern Present (11/16/2021)   [employer] of Occupational Health - Occupational Stress Questionnaire    Feeling  of Stress : Not at all  Social Connections: Moderately Isolated (11/16/2021)   Social Connection and Isolation Panel [NHANES]    Frequency of Communication with Friends and Family: Three times a week    Frequency of Social Gatherings with Friends and Family: Three times a week    Attends Religious Services: More than 4 times per year    Active Member of Clubs or Organizations: No    Attends Banker Meetings: Never    Marital Status: Divorced    Review of Systems: A 12 point ROS discussed and pertinent positives are indicated in the HPI above.  All other systems are negative.  Review of Systems  Vital Signs: BP 112/63 (BP Location: Right Arm)   Pulse 89   Temp 97.9 F (36.6 C) (Oral)   Resp 18   Ht 5\' 11"  (1.803 m)   Wt 198 lb 6.6 oz (90 kg)   SpO2 94%   BMI 27.67 kg/m   Physical Exam Constitutional:      General: She is not in acute distress.    Appearance: She is not ill-appearing.  HENT:     Mouth/Throat:     Mouth: Mucous membranes are moist.     Pharynx: Oropharynx is clear.  Cardiovascular:     Rate and Rhythm: Normal rate. Rhythm irregular.     Heart sounds: Normal heart sounds.  Pulmonary:     Effort: Pulmonary effort is normal. No respiratory distress.     Breath sounds: Normal breath sounds.  Abdominal:     Palpations: Abdomen is soft.     Tenderness: There is abdominal tenderness.     Comments: Mild RUQ tenderness  Neurological:     Mental Status: She is oriented to person, place, and time.  Psychiatric:        Mood and Affect:  Mood normal.        Thought Content: Thought content normal.     Imaging: CT ABDOMEN PELVIS W CONTRAST  Result Date: 10/19/2022 CLINICAL DATA:  Abdominal pain EXAM: CT ABDOMEN AND PELVIS WITH CONTRAST TECHNIQUE: Multidetector CT imaging of the abdomen and pelvis was performed using the standard protocol following bolus administration of intravenous contrast. RADIATION DOSE REDUCTION: This exam was performed according to the departmental dose-optimization program which includes automated exposure control, adjustment of the mA and/or kV according to patient size and/or use of iterative reconstruction technique. CONTRAST:  70mL OMNIPAQUE IOHEXOL 350 MG/ML SOLN COMPARISON:  None Available. FINDINGS: Lower chest: Heart is slightly enlarged. Significant calcifications along the mitral valve annulus. There is some linear opacity at the bases likely scar or atelectasis. 6 mm subpleural nodule left lower lobe on series 4, image 3. Small hiatal hernia. Hepatobiliary: Gallbladder is dilated with wall thickening, stones and stranding. Please correlate for acute cholecystitis. There may be a stone towards the neck as well. Underlying fatty liver infiltration. Patent portal vein. Pancreas: Severe fatty atrophy of the pancreas. There is a cystic lesion towards the tail of the pancreas measuring 2.7 by 2.5 cm. Series 3, image 27. Spleen: Normal in size without focal abnormality. Adrenals/Urinary Tract: The adrenal glands are preserved. Bilateral small renal stones. These punctate overall except for 4 mm lower pole right-sided stone. There is cyst along the lower pole left kidney measuring 2.2 cm with Hounsfield unit of 11. Bosniak 1 lesion. Tiny focus on the right, Bosniak 2 lesion. Again no specific imaging follow-up of the cysts. The ureters have normal course and caliber extending down to the bladder.  Bladder is underdistended. Stomach/Bowel: There is some fluid in the stomach. Stomach is nondilated. The small bowel is  nondilated. Large bowel has a normal course and caliber. Normal appendix. Vascular/Lymphatic: Normal caliber aorta and IVC. Mild vascular calcifications. No specific abnormal lymph node enlargement identified in the abdomen and pelvis. Replaced left hepatic artery from the left gastric. Reproductive: Status post hysterectomy. No adnexal masses. Other: Small amount of free fluid dependently in the pelvis, nonspecific. No free air. Fat containing umbilical hernia. Musculoskeletal: Scattered degenerative changes along the spine with levoconvex curvature of the lower lumbar spine centered at L4. There is also multilevel trace retrolisthesis and multilevel stenosis. IMPRESSION: Abnormal gallbladder with dilatation and stones. There is significant wall thickening, edema and adjacent stranding consistent with a acute cholecystitis. Possible stone in the neck of the gallbladder as well. 2.7 cm cystic lesion along the tail of the pancreas. In the absence of prior would recommend follow up imaging in 6 months. Electronically Signed   By: Karen Kays M.D.   On: 10/19/2022 12:35   CT Head Wo Contrast  Result Date: 10/19/2022 CLINICAL DATA:  Headache, new onset. EXAM: CT HEAD WITHOUT CONTRAST TECHNIQUE: Contiguous axial images were obtained from the base of the skull through the vertex without intravenous contrast. RADIATION DOSE REDUCTION: This exam was performed according to the departmental dose-optimization program which includes automated exposure control, adjustment of the mA and/or kV according to patient size and/or use of iterative reconstruction technique. COMPARISON:  Head CT 06/28/2021.  MRI brain 06/28/2021. FINDINGS: Brain: No acute hemorrhage. Unchanged mild chronic small-vessel disease with old lacunar infarcts in the bilateral caudate heads. Cortical gray-white differentiation is otherwise preserved. Prominence of the ventricles and sulci within expected range for age. No hydrocephalus or extra-axial  collection. No mass effect or midline shift. Vascular: No hyperdense vessel or unexpected calcification. Skull: No calvarial fracture or suspicious bone lesion. Skull base is unremarkable. Sinuses/Orbits: Unremarkable. Other: None IMPRESSION: 1. No acute intracranial abnormality. 2. Unchanged mild chronic small-vessel disease. Electronically Signed   By: Orvan Falconer M.D.   On: 10/19/2022 12:31    Labs:  CBC: Recent Labs    10/20/22 0225 10/20/22 1143 10/21/22 0207 10/21/22 0640  WBC 13.7* 12.4* 8.8 11.1*  HGB 11.9* 11.8* 9.7* 11.5*  HCT 36.9 36.8 31.8* 36.5  PLT 226 200 156 192    COAGS: Recent Labs    10/19/22 1123 10/20/22 0225 10/20/22 1143 10/20/22 2145 10/21/22 0207 10/21/22 0640  INR 2.0*  --   --   --   --   --   APTT  --    < > 50* 89* 157* 70*   < > = values in this interval not displayed.    BMP: Recent Labs    11/24/21 0203 07/22/22 0844 10/19/22 1123 10/19/22 1129 10/20/22 0225 10/21/22 0640  NA 141 144 131* 134* 134* 136  K 4.5 4.3 3.3* 3.3* 3.0* 3.3*  CL 114* 106 97* 100 99 101  CO2 19* 23 18*  --  22 23  GLUCOSE 93 100* 152* 152* 139* 113*  BUN 22 16 23  26* 19 10  CALCIUM 8.2* 9.5 8.8*  --  7.9* 8.2*  CREATININE 0.73 0.99 1.64* 1.50* 1.15* 0.97  GFRNONAA >60  --  31*  --  48* 58*    LIVER FUNCTION TESTS: Recent Labs    10/21/21 1154 10/19/22 1123  BILITOT 0.6 1.2  AST 20 36  ALT 15 20  ALKPHOS 78 77  PROT 7.2 7.0  ALBUMIN 4.2 3.1*     Assessment and Plan: Acute cholecystitis Imaging reviewed with/by Dr. Lowella Dandy. Amenable to perc chole drain placement A.fib/RVR, HR better controlled, remains on heparin gtt. Will stop heparin gtt around 1pm Will try for procedure this afternoon Risks and benefits discussed with the patient as well as her son over telephone including, but not limited to bleeding, infection, gallbladder perforation, bile leak, sepsis or even death.  All questions were answered, patient is agreeable to  proceed. Consent signed by patient herself but son also agreeable to procedure.   Electronically Signed: Brayton El, PA-C 10/21/2022, 11:31 AM   I spent a total of 20 minutes in face to face in clinical consultation, greater than 50% of which was counseling/coordinating care for perc chole drain

## 2022-10-21 NOTE — Evaluation (Signed)
Occupational Therapy Evaluation Patient Details Name: Laura Mendez MRN: [id number] DOB: Dec 03, 1940 Today's Date: 10/21/2022   History of Present Illness 82 y.o. female with medical history significant of breast CA, chronic diastolic CHF, afib, and CVA presenting with 3-4 days of diarrhea. CT with acute cholecystitis.   Clinical Impression   Patient admitted for the diagnosis above.  PTA she reports living at home, continues to drive locally, son assists with groceries and checks on her regularly, walks in her condo without an AD, completes her own ADL and iADL.  She presents with memory deficits, asking and repeating the same questions/statements, so prior level will need to be verified.  Patient able to sit EOB with use of bed functions and up to Mod A, had loose stool, and was returned to supine and assisted with peri care.  IR in the room to transport the patient for a procedure, so mobility and ADL will need to be better assessed to ensure accuracy, and assist with discharge recommendations.  HH OT is recommended initially for post acute rehab.       Recommendations for follow up therapy are one component of a multi-disciplinary discharge planning process, led by the attending physician.  Recommendations may be updated based on patient status, additional functional criteria and insurance authorization.   Assistance Recommended at Discharge Intermittent Supervision/Assistance  Patient can return home with the following Assist for transportation;Assistance with cooking/housework;A lot of help with bathing/dressing/bathroom;A lot of help with walking and/or transfers    Functional Status Assessment  Patient has had a recent decline in their functional status and demonstrates the ability to make significant improvements in function in a reasonable and predictable amount of time.  Equipment Recommendations  None recommended by OT    Recommendations for Other Services        Precautions / Restrictions Precautions Precautions: Fall Precaution Comments: loose stool Restrictions Weight Bearing Restrictions: No      Mobility Bed Mobility Overal bed mobility: Needs Assistance Bed Mobility: Supine to Sit, Sit to Supine     Supine to sit: Min assist, HOB elevated Sit to supine: Mod assist   General bed mobility comments: use of bedrail, difficulty advancing legs off/on the bed    Transfers                   General transfer comment: deferred, patient with loose stool      Balance Overall balance assessment: Needs assistance Sitting-balance support: Feet supported Sitting balance-Leahy Scale: Fair                                     ADL either performed or assessed with clinical judgement   ADL Overall ADL's : Needs assistance/impaired     Grooming: Wash/dry hands;Wash/dry face;Min guard;Sitting                       Toileting- Clothing Manipulation and Hygiene: Maximal assistance;Bed level               Vision Patient Visual Report: No change from baseline       Perception     Praxis      Pertinent Vitals/Pain Pain Assessment Pain Assessment: No/denies pain     Hand Dominance Right   Extremity/Trunk Assessment Upper Extremity Assessment Upper Extremity Assessment: RUE deficits/detail;LUE deficits/detail RUE Deficits / Details: decreased end range to shoulder flexion, pain with shoulder AROM  RUE Sensation: WNL RUE Coordination: WNL LUE Deficits / Details: Generalized weakness with large bruise to dorsum of hand. LUE Sensation: WNL LUE Coordination: WNL   Lower Extremity Assessment Lower Extremity Assessment: Defer to PT evaluation   Cervical / Trunk Assessment Cervical / Trunk Assessment: Kyphotic   Communication Communication Communication: No difficulties   Cognition Arousal/Alertness: Awake/alert Behavior During Therapy: WFL for tasks assessed/performed Overall Cognitive  Status: Impaired/Different from baseline Area of Impairment: Memory, Orientation                 Orientation Level: Person, Place, Situation   Memory: Decreased short-term memory               General Comments   VSS on RA    Exercises     Shoulder Instructions      Home Living Family/patient expects to be discharged to:: Private residence Living Arrangements: Alone Available Help at Discharge: Family;Available 24 hours/day Type of Home: Other(Comment) (condo) Home Access: Stairs to enter Entrance Stairs-Number of Steps: 3 Entrance Stairs-Rails: Right Home Layout: Able to live on main level with bedroom/bathroom;Multi-level Alternate Level Stairs-Number of Steps: patient has stair lift chair   Bathroom Shower/Tub: Producer, television/film/video: Standard Bathroom Accessibility: Yes How Accessible: Accessible via walker Home Equipment: Rolling Walker (2 wheels);Cane - single point;Grab bars - tub/shower;Shower seat - built in          Prior Functioning/Environment Prior Level of Function : Independent/Modified Independent;Driving;Patient poor historian/Family not available             Mobility Comments: Patient reports driving locally, son assists with groceries.  Patient stating she has a RW, but does not use it in the condo.  ? accuracy ADLs Comments: Patient reports being Ind with ADL and IADL.  ? accuracy.        OT Problem List: Decreased strength;Decreased range of motion;Decreased activity tolerance;Impaired balance (sitting and/or standing);Decreased safety awareness;Decreased cognition      OT Treatment/Interventions: Self-care/ADL training;Therapeutic activities;Patient/family education;Balance training;DME and/or AE instruction    OT Goals(Current goals can be found in the care plan section) Acute Rehab OT Goals Patient Stated Goal: Return home OT Goal Formulation: With patient Time For Goal Achievement: 11/04/22 Potential to Achieve  Goals: Fair ADL Goals Pt Will Perform Grooming: with supervision;standing Pt Will Perform Upper Body Dressing: with set-up;sitting Pt Will Perform Lower Body Dressing: with supervision;sit to/from stand Pt Will Transfer to Toilet: with supervision;ambulating;regular height toilet  OT Frequency: Min 2X/week    Co-evaluation              AM-PAC OT "6 Clicks" Daily Activity     Outcome Measure Help from another person eating meals?: None Help from another person taking care of personal grooming?: A Little Help from another person toileting, which includes using toliet, bedpan, or urinal?: A Lot Help from another person bathing (including washing, rinsing, drying)?: A Lot Help from another person to put on and taking off regular upper body clothing?: A Little Help from another person to put on and taking off regular lower body clothing?: A Lot 6 Click Score: 16   End of Session Nurse Communication: Mobility status  Activity Tolerance: Patient tolerated treatment well Patient left: in bed;with call bell/phone within reach;with bed alarm set  OT Visit Diagnosis: Unsteadiness on feet (R26.81);Muscle weakness (generalized) (M62.81);Other symptoms and signs involving cognitive function                Time: 7829-5621 OT Time Calculation (  min): 21 min Charges:  OT General Charges $OT Visit: 1 Visit OT Evaluation $OT Eval Moderate Complexity: 1 Mod  10/21/2022  RP, OTR/L  Acute Rehabilitation Services  Office:  [phone]   Suzanna Obey 10/21/2022, 2:33 PM

## 2022-10-21 NOTE — Sedation Documentation (Signed)
Gauze+paper tape to [employer] tube

## 2022-10-21 NOTE — Progress Notes (Signed)
PROGRESS NOTE  Laura Mendez  WGN:[id number] DOB: July 08, 1940 DOA: 10/19/2022 PCP: [name], MD   Brief Narrative: Patient is a 82 year old female with history of breast cancer, chronic diastolic CHF, A-fib, CVA who presented with complaint of diarrhea, nausea, vomiting ,abdominal pain.  On presentation, she was in A-fib with RVR with heart rate in the range of 150.  CT abdomen/pelvis was consistent of cholecystitis, general surgery consulted.  Started on amiodarone drip.  Cardiology following.  IR consulted for percutaneous cholecystostomy .  Assessment & Plan:  Principal Problem:   Acute cholecystitis Active Problems:   Asthma with COPD (HCC)   Paroxysmal atrial fibrillation (HCC)   Malignant neoplasm of lower-outer quadrant of right breast of female, estrogen receptor positive (HCC)   CVA (cerebral vascular accident) (HCC)   Atopic dermatitis   AKI (acute kidney injury) (HCC)   Pancreatic lesion   Acute cholecystitis: Presented with nausea, vomiting, abdominal pain.  Lab work showed leukocytosis with WBC count of 21,000, AKI with creatinine of 1.6, normal liver enzymes. CT abdomen/pelvis showed abnormal gallbladder with dilatation and stones,significant wall thickening, edema and adjacent stranding consistent with  acute cholecystitis. Possible stone in the neck of the gallbladder . General surgery consulted.  Continue antibiotics.  Follow-up cultures:NGTD  Leukocytosis improving.  Lactic acidosis has resolved. IR consulted for cholecystostomy drain placement  A-fib with RVR: Takes Eliquis at home.  Heart rate in the range of 150 on presentation.  Started on amiodarone drip.  Cardiology consulted and following. eliquis at home,now on iv heparin.  We will convert amiodarone to oral when appropriate  AKI: Presented with creatinine in the range of 1.6.  Kidney function  normalized with IV fluid.  Hypokalemia: Currently being supplemented.  History of CVA: History  of mild to moderate dementia.  Continue delirium precautions  Asthma/COPD: Continue bronchodilators as needed  Atopic dermatitis: On Dupixent at home  History of breast cancer: S/p lumpectomy.  On letrozole at home.   Pancreatic lesion: 2.7 cm cystic lesion seen along the tail of the pancreas. Recommended follow up imaging in 6 months.        DVT prophylaxis:iv heparin     Code Status: Full Code  Family Communication: Discussed with son on phone on 5/30  Patient status:Inpatient  Patient is from :home  Anticipated discharge HQ:[facility]  Estimated DC date:1-2 days   Consultants: Surgery,cardiology.IR  Procedures:None  Antimicrobials:  Anti-infectives (From admission, onward)    Start     Dose/Rate Route Frequency Ordered Stop   10/19/22 1900  piperacillin-tazobactam (ZOSYN) IVPB 3.375 g        3.375 g 12.5 mL/hr over 240 Minutes Intravenous Every 8 hours 10/19/22 1459     10/19/22 1445  piperacillin-tazobactam (ZOSYN) IVPB 3.375 g  Status:  Discontinued        3.375 g 100 mL/hr over 30 Minutes Intravenous Every 8 hours 10/19/22 1444 10/19/22 1459   10/19/22 1300  piperacillin-tazobactam (ZOSYN) IVPB 3.375 g        3.375 g 100 mL/hr over 30 Minutes Intravenous  Once 10/19/22 1252 10/19/22 1348       Subjective: Patient seen and examined at bedside today.  Hemodynamically stable.  Comfortable.  Heart rate in the range of 90-110.  Denies any chest pain or palpitations.  She went into rapid A-fib last night and was again started on IV amiodarone.  During my evaluation she denies any nausea, vomiting or abdominal pain  Objective: Vitals:   10/21/22 0900 10/21/22 1100 10/21/22  1200 10/21/22 1205  BP:   103/63   Pulse: 80 91 94 (S) 60  Resp: 19 20 20 20   Temp:      TempSrc:      SpO2: 95% 98% 98% 98%  Weight:      Height:        Intake/Output Summary (Last 24 hours) at 10/21/2022 1313 Last data filed at 10/21/2022 1100 Gross per 24 hour  Intake 3014.87 ml   Output 1500 ml  Net 1514.87 ml   Filed Weights   10/19/22 1600  Weight: 90 kg    Examination:   General exam: Overall comfortable, not in distress HEENT: PERRL Respiratory system:  no wheezes or crackles  Cardiovascular system: irregularly irregular rhythm Gastrointestinal system: Abdomen is nondistended, soft and nontender. Central nervous system: Alert and oriented Extremities: No edema, no clubbing ,no cyanosis Skin: No rashes, no ulcers,no icterus     Data Reviewed: I have personally reviewed following labs and imaging studies  CBC: Recent Labs  Lab 10/19/22 1123 10/19/22 1129 10/20/22 0225 10/20/22 1143 10/21/22 0207 10/21/22 0640  WBC 21.6*  --  13.7* 12.4* 8.8 11.1*  NEUTROABS 18.3*  --   --   --   --   --   HGB 14.6 16.3* 11.9* 11.8* 9.7* 11.5*  HCT 45.9 48.0* 36.9 36.8 31.8* 36.5  MCV 89.8  --  90.7 92.7 95.2 90.8  PLT 259  --  226 200 156 192   Basic Metabolic Panel: Recent Labs  Lab 10/19/22 1123 10/19/22 1129 10/20/22 0225 10/20/22 1143 10/20/22 2145 10/21/22 0640  NA 131* 134* 134*  --   --  136  K 3.3* 3.3* 3.0*  --   --  3.3*  CL 97* 100 99  --   --  101  CO2 18*  --  22  --   --  23  GLUCOSE 152* 152* 139*  --   --  113*  BUN 23 26* 19  --   --  10  CREATININE 1.64* 1.50* 1.15*  --   --  0.97  CALCIUM 8.8*  --  7.9*  --   --  8.2*  MG  --   --   --  1.8 1.8  --      Recent Results (from the past 240 hour(s))  Blood culture (routine x 2)     Status: None (Preliminary result)   Collection Time: 10/19/22  1:01 PM   Specimen: BLOOD  Result Value Ref Range Status   Specimen Description BLOOD SITE NOT SPECIFIED  Final   Special Requests   Final    BOTTLES DRAWN AEROBIC AND ANAEROBIC Blood Culture adequate volume   Culture   Final    NO GROWTH 2 DAYS Performed at [facility], [address], [place], [place] [address]    Report Status PENDING  Incomplete  Blood culture (routine x 2)     Status: None (Preliminary result)    Collection Time: 10/19/22  1:06 PM   Specimen: BLOOD LEFT ARM  Result Value Ref Range Status   Specimen Description BLOOD LEFT ARM  Final   Special Requests   Final    BOTTLES DRAWN AEROBIC AND ANAEROBIC Blood Culture adequate volume   Culture   Final    NO GROWTH 2 DAYS Performed at [facility], [address] [place], [place] [address]    Report Status PENDING  Incomplete  C Difficile Quick Screen w PCR reflex  Status: None   Collection Time: 10/21/22  7:10 AM   Specimen: STOOL  Result Value Ref Range Status   C Diff antigen NEGATIVE NEGATIVE Final   C Diff toxin NEGATIVE NEGATIVE Final   C Diff interpretation No C. difficile detected.  Final    Comment: Performed at [facility], [address], [place], [place] [address]     Radiology Studies: No results found.  Scheduled Meds:  mometasone-formoterol  2 puff Inhalation BID   sodium chloride flush  3 mL Intravenous Q12H   Continuous Infusions:  sodium chloride 75 mL/hr at 10/21/22 1100   amiodarone 30 mg/hr (10/21/22 0810)   heparin 1,500 Units/hr (10/21/22 1100)   piperacillin-tazobactam (ZOSYN)  IV Stopped (10/21/22 6045)     LOS: 2 days   Burnadette Pop, MD [facility] P5/30/2024, 1:13 PM

## 2022-10-21 NOTE — Progress Notes (Addendum)
ANTICOAGULATION CONSULT NOTE  Pharmacy Consult for heparin Indication: atrial fibrillation  Heparin Dosing Weight: 88.9 kg  Labs: Recent Labs    10/19/22 1123 10/19/22 1123 10/19/22 1129 10/20/22 0225 10/20/22 1143 10/20/22 2145 10/21/22 0207 10/21/22 0640  HGB 14.6  --  16.3* 11.9* 11.8*  --  9.7* 11.5*  HCT 45.9  --  48.0* 36.9 36.8  --  31.8* 36.5  PLT 259  --   --  226 200  --  156 192  APTT  --    < >  --  58* 50* 89* 157* 70*  LABPROT 22.6*  --   --   --   --   --   --   --   INR 2.0*  --   --   --   --   --   --   --   HEPARINUNFRC  --   --   --  >1.10*  --   --   --  0.86*  CREATININE 1.64*  --  1.50* 1.15*  --   --   --  0.97   < > = values in this interval not displayed.    Assessment: 82 yof on apixaban PTA for afib, transitioned to heparin infusion prior to IR procedure for percutaneous cholecystostomy tube placement 5/30. Heparin paused at 1300 prior to procedure.  PM update - per discussion with IR MD Lowella Dandy), resume heparin 2 hours post-procedure (procedure ended ~1615 per RN). Previously therapeutic on heparin at 1500 units/hr. CBC stable. No bleed issues reported. Monitor using aPTT while apixaban influencing heparin levels.  Goal of Therapy:  Heparin level 0.3-0.7 units/ml aPTT 66-102 seconds Monitor platelets by anticoagulation protocol: Yes   Plan:  Heparin resumed 2 hrs post-IR procedure at previous therapeutic rate 1500 units/hr with no bolus Check 8hr aPTT from resumption Monitor daily aPTT/heparin level until correlating, CBC, s/sx bleeding   Leia Alf, PharmD, BCPS Clinical Pharmacist 10/21/2022 6:05 PM

## 2022-10-21 NOTE — Progress Notes (Signed)
ANTICOAGULATION CONSULT NOTE - Follow Up Consult  Pharmacy Consult for heparin Indication: atrial fibrillation  Height :  26ft 11in. Weight: 90 kg Heparin dosing weight: 88.9 kg  Labs: Recent Labs    10/19/22 1123 10/19/22 1123 10/19/22 1129 10/20/22 0225 10/20/22 1143 10/20/22 2145 10/21/22 0207 10/21/22 0640  HGB 14.6  --  16.3* 11.9* 11.8*  --  9.7* 11.5*  HCT 45.9  --  48.0* 36.9 36.8  --  31.8* 36.5  PLT 259  --   --  226 200  --  156 192  APTT  --    < >  --  58* 50* 89* 157* 70*  LABPROT 22.6*  --   --   --   --   --   --   --   INR 2.0*  --   --   --   --   --   --   --   HEPARINUNFRC  --   --   --  >1.10*  --   --   --  0.86*  CREATININE 1.64*  --  1.50* 1.15*  --   --   --  0.97   < > = values in this interval not displayed.     Assessment: Laura Mendez ,    IV Heparin infusion at 1500 units/hr with aptt 70 sec at goal no infusion issues or overt signs of bleeding per RN. Noted that Hgb has dropped slightly from admit but remains stable  (14.6>16.3>11.9> 11.8)  Heparin level falsely elevated from apixaban use previus - will continue to dose heparin with aptt - heparin stopped at 1pm for planned procedure in IR  Goal of Therapy:  aPTT 66-102 seconds   Plan:  Follow up post IR for resume heparin    [name] Pharm.D. CPP, BCPS Clinical Pharmacist [phone] 10/21/2022 2:44 PM

## 2022-10-22 DIAGNOSIS — K810 Acute cholecystitis: Secondary | ICD-10-CM | POA: Diagnosis not present

## 2022-10-22 LAB — COMPREHENSIVE METABOLIC PANEL
ALT: 21 U/L (ref 0–44)
AST: 31 U/L (ref 15–41)
Albumin: 1.9 g/dL — ABNORMAL LOW (ref 3.5–5.0)
Alkaline Phosphatase: 44 U/L (ref 38–126)
Anion gap: 12 (ref 5–15)
BUN: 7 mg/dL — ABNORMAL LOW (ref 8–23)
CO2: 23 mmol/L (ref 22–32)
Calcium: 7.3 mg/dL — ABNORMAL LOW (ref 8.9–10.3)
Chloride: 96 mmol/L — ABNORMAL LOW (ref 98–111)
Creatinine, Ser: 1.03 mg/dL — ABNORMAL HIGH (ref 0.44–1.00)
GFR, Estimated: 54 mL/min — ABNORMAL LOW (ref >60)
Glucose, Bld: 443 mg/dL — ABNORMAL HIGH (ref 70–99)
Potassium: 3.2 mmol/L — ABNORMAL LOW (ref 3.5–5.1)
Sodium: 131 mmol/L — ABNORMAL LOW (ref 135–145)
Total Bilirubin: 0.3 mg/dL (ref 0.3–1.2)
Total Protein: 4.8 g/dL — ABNORMAL LOW (ref 6.5–8.1)

## 2022-10-22 LAB — CBC
HCT: 35.1 % — ABNORMAL LOW (ref 36.0–46.0)
Hemoglobin: 11 g/dL — ABNORMAL LOW (ref 12.0–15.0)
MCH: 28.6 pg (ref 26.0–34.0)
MCHC: 31.3 g/dL (ref 30.0–36.0)
MCV: 91.2 fL (ref 80.0–100.0)
Platelets: 201 10*3/uL (ref 150–400)
RBC: 3.85 MIL/uL — ABNORMAL LOW (ref 3.87–5.11)
RDW: 14.4 % (ref 11.5–15.5)
WBC: 10.4 10*3/uL (ref 4.0–10.5)
nRBC: 0 % (ref 0.0–0.2)

## 2022-10-22 LAB — CULTURE, BLOOD (ROUTINE X 2): Culture: NO GROWTH

## 2022-10-22 LAB — APTT: aPTT: 69 seconds — ABNORMAL HIGH (ref 24–36)

## 2022-10-22 LAB — HEPARIN LEVEL (UNFRACTIONATED): Heparin Unfractionated: 0.3 IU/mL (ref 0.30–0.70)

## 2022-10-22 LAB — GLUCOSE, CAPILLARY: Glucose-Capillary: 115 mg/dL — ABNORMAL HIGH (ref 70–99)

## 2022-10-22 MED ORDER — APIXABAN 5 MG PO TABS
5.0000 mg | ORAL_TABLET | Freq: Two times a day (BID) | ORAL | Status: DC
  Administered 2022-10-22 – 2022-10-27 (×11): 5 mg via ORAL
  Filled 2022-10-22 (×11): qty 1

## 2022-10-22 MED ORDER — POTASSIUM CHLORIDE CRYS ER 20 MEQ PO TBCR
40.0000 meq | EXTENDED_RELEASE_TABLET | Freq: Once | ORAL | Status: AC
  Administered 2022-10-22: 40 meq via ORAL
  Filled 2022-10-22: qty 2

## 2022-10-22 NOTE — Progress Notes (Addendum)
Patient ID: Laura Mendez, female   DOB: Sep 15, 1940, 82 y.o.   MRN: [id number]      Subjective: IR drain placed yesterday and patient reports she is doing well this AM. Unsure if she is still having diarrhea. Still in A.Fib with irregularly irregular.  Objective: Vital signs in last 24 hours: Temp:  [97.6 F (36.4 C)-98.7 F (37.1 C)] 97.6 F (36.4 C) (05/31 0744) Pulse Rate:  [56-127] 77 (05/31 0744) Resp:  [14-26] 20 (05/31 0744) BP: (88-141)/(40-100) 100/50 (05/31 0744) SpO2:  [90 %-100 %] 97 % (05/31 0800) Last BM Date : (S) 10/21/22  Intake/Output from previous day: 05/30 0701 - 05/31 0700 In: 2610.8 [P.O.:420; I.V.:1764.4; IV Piggyback:426.4] Out: 1260 [Urine:1200; Drains:60] Intake/Output this shift: Total I/O In: -  Out: 300 [Urine:300]  General: pleasant, WD, elderly female who is laying in bed in NAD HEENT: sclera anicteric  Heart: irregularly irregular Lungs: Respiratory effort nonlabored on [place] Abd: soft, less ttp in RUQ, drain present with bloody purulent fluid, +BS, ND   Lab Results: CBC  Recent Labs    10/21/22 0640 10/22/22 0207  WBC 11.1* 10.4  HGB 11.5* 11.0*  HCT 36.5 35.1*  PLT 192 201    BMET Recent Labs    10/21/22 0640 10/22/22 0207  NA 136 131*  K 3.3* 3.2*  CL 101 96*  CO2 23 23  GLUCOSE 113* 443*  BUN 10 7*  CREATININE 0.97 1.03*  CALCIUM 8.2* 7.3*    PT/INR Recent Labs    10/19/22 1123  LABPROT 22.6*  INR 2.0*    ABG No results for input(s): "PHART", "HCO3" in the last 72 hours.  Invalid input(s): "PCO2", "PO2"  Studies/Results: IR Perc Cholecystostomy  Addendum Date: 10/21/2022   ADDENDUM REPORT: 10/21/2022 17:16 ADDENDUM: Fluoroscopic and ultrasound images were taken and saved for documentation. Electronically Signed   By: Richarda Overlie M.D.   On: 10/21/2022 17:16   Result Date: 10/21/2022 INDICATION: 82 year old with cholelithiasis and concern for acute cholecystitis. Patient is not a good surgical  candidate at this time. Plan for percutaneous cholecystostomy tube placement. EXAM: CHOLECYSTOSTOMY TUBE PLACEMENT WITH ULTRASOUND AND FLUOROSCOPIC GUIDANCE MEDICATIONS: Moderate sedation ANESTHESIA/SEDATION: Moderate (conscious) sedation was employed during this procedure. A total of Versed 0.5 mg and Fentanyl 25 mcg was administered intravenously by the radiology nurse. Total intra-service moderate Sedation Time: 18 minutes. The patient's level of consciousness and vital signs were monitored continuously by radiology nursing throughout the procedure under my direct supervision. FLUOROSCOPY: Radiation Exposure Index (as provided by the fluoroscopic device): 24 mGy Kerma CONTRAST:  10 mL Omnipaque 300 COMPLICATIONS: None immediate. PROCEDURE: Informed consent was obtained after thorough discussion of the potential risks and benefits. All questions were addressed. Maximal Sterile Barrier Technique was utilized including caps, mask, sterile gowns, sterile gloves, sterile drape, hand hygiene and skin antiseptic. A timeout was performed prior to the initiation of the procedure. Right side of the abdomen was prepped and draped in sterile fashion. Ultrasound was used to identify the gallbladder. Skin was anesthetized using 1% lidocaine. Small incision was made. Using ultrasound guidance, 21 gauge needle was directed into the gallbladder using a transhepatic approach. 0.018 wire was advanced in the gallbladder. Transitional dilator set was placed. Small amount of contrast was injected to confirm placement. J wire was placed and the tract was dilated to accommodate a 10 [place] multipurpose drain. 10 French drain was reconstituted in the gallbladder. 60 mL of cloudy orange fluid was removed. Gallbladder was decompressed at end of the  procedure. Drain was flushed with saline and attached to a gravity bag. Drain was sutured to skin. FINDINGS: Gallbladder was markedly abnormal on ultrasound with multiple stones and asymmetric  wall thickening. Drain was successfully placed using a transhepatic approach. Cholangiogram demonstrated multiple stones but there was filling of the common bile duct and duodenum. 60 mL of orange colored fluid was removed. Fluid was sent for culture. IMPRESSION: 1. Successful percutaneous cholecystostomy tube placement. 2. Cholelithiasis.  Cystic duct and common bile duct are patent. Electronically Signed: By: Richarda Overlie M.D. On: 10/21/2022 17:06    Anti-infectives: Anti-infectives (From admission, onward)    Start     Dose/Rate Route Frequency Ordered Stop   10/19/22 1900  piperacillin-tazobactam (ZOSYN) IVPB 3.375 g        3.375 g 12.5 mL/hr over 240 Minutes Intravenous Every 8 hours 10/19/22 1459     10/19/22 1445  piperacillin-tazobactam (ZOSYN) IVPB 3.375 g  Status:  Discontinued        3.375 g 100 mL/hr over 30 Minutes Intravenous Every 8 hours 10/19/22 1444 10/19/22 1459   10/19/22 1300  piperacillin-tazobactam (ZOSYN) IVPB 3.375 g        3.375 g 100 mL/hr over 30 Minutes Intravenous  Once 10/19/22 1252 10/19/22 1348       Assessment/Plan: Acute cholecystitis  - CT AP with gallbladder wall thickening, cholelithiasis and pericholecystic stranding, possible stone at the neck of the gallbladder - s/p perc chole yesterday - GS with G+ cocci and G- rods - WBC normalized  - continue abx and ok to DC on PO abx when culture results - ADAT, ok to resume eliquis from surgery standpoint - drain teaching for patient and family - will need follow up in drain clinic and follow up with surgery once drain studied in a few weeks  - general surgery will sign off at this time, please call if we can be of further assistance  AF RVR - on heparin gtt, per primary and cards   FEN:  HH diet VTE: hep gtt - ok to resume eliquis ID: Zosyn 5/28>>   LOS: 3 days   Juliet Rude, [facility] Surgery 10/22/2022, 9:19 AM Please see Amion for pager number during day hours  7:00am-4:30pm   I personally saw the patient and performed a substantive portion of the medical decision making, in conjunction with the Advanced Practice provider Trixie Deis, PA-C for the treatment of cholecystitis. Denies pain.  PTC drain in place with seropurulent output.  Would complete 5 more days of antibiotics total.  Will plan to have her follow-up with me in 4 to 6 weeks following drain study to discuss her candidacy for laparoscopic cholecystectomy. General surgery team will sign off for now, please call if there are any questions or concerns  Berna Bue MD FACS

## 2022-10-22 NOTE — Progress Notes (Signed)
ANTICOAGULATION CONSULT NOTE  Pharmacy Consult for heparin  > transition/resumed Eliquis Indication: atrial fibrillation  Heparin Dosing Weight: 88.9 kg  Labs: Recent Labs    10/20/22 0225 10/20/22 1143 10/21/22 0207 10/21/22 0640 10/22/22 0207  HGB 11.9*   < > 9.7* 11.5* 11.0*  HCT 36.9   < > 31.8* 36.5 35.1*  PLT 226   < > 156 192 201  APTT 58*   < > 157* 70* 69*  HEPARINUNFRC >1.10*  --   --  0.86* 0.30  CREATININE 1.15*  --   --  0.97 1.03*   < > = values in this interval not displayed.     Assessment: 82 yof on apixaban PTA for afib, transitioned to heparin infusion prior to IR procedure for percutaneous cholecystostomy tube placement 5/30. Heparin paused at 1300 prior to procedure.  10/21/22 PM: per discussion with IR MD Lowella Dandy), resume heparin 2 hours post-procedure (procedure ended ~1615 per RN). Previously therapeutic on heparin at 1500 units/hr. CBC stable. No bleed issues reported. Monitor using aPTT while apixaban influencing heparin levels.  5/31 AM update:  Heparin level therapeutic  aPTT/heparin level correlating-will DC further aPTT's  5/31 PM update:   Pharmacy consulted to switch back to Eliquis for nonvlavular Afib.  Taking Eliquis 5 mg bid prior to admission for afib.   This dose is appropriate.     Plan:  IV heparin discontinued. Resume PTA Eliquis 5mg  BID now for Afib.  Monitor for bleeding.  Pharmacy will sign off  Thank you for allowing pharmacy to be part of this patients care team. Noah Delaine, RPh Clinical Pharmacist  10/22/2022 12:27 PM Please check AMION for all [facility] Pharmacy phone numbers After 10:00 PM, call Main Pharmacy [phone]

## 2022-10-22 NOTE — Care Management Important Message (Signed)
Important Message  Patient Details  Name: Laura Mendez MRN: [id number] Date of Birth: [birth year]   Medicare Important Message Given:  Yes     Renie Ora 10/22/2022, 11:11 AM

## 2022-10-22 NOTE — Progress Notes (Signed)
Occupational Therapy Treatment Patient Details Name: Laura Mendez MRN: [id number] DOB: 1940-06-27 Today's Date: 10/22/2022   History of present illness 82 y.o. female with medical history significant of breast CA, chronic diastolic CHF, afib, and CVA presenting with 3-4 days of diarrhea. CT with acute cholecystitis.   OT comments  Pt. Seen for skilled OT treatment session.  Able to complete bed mobility, LB dressing seated, and bsc transfer and peri care.  Requried intermittent cues for redirection and task completion secondary to self distracting behaviors and conversation.  Moving well overall though.  Reports good family support at home.  Cont. With current acute ot poc.     Recommendations for follow up therapy are one component of a multi-disciplinary discharge planning process, led by the attending physician.  Recommendations may be updated based on patient status, additional functional criteria and insurance authorization.    Assistance Recommended at Discharge Intermittent Supervision/Assistance  Patient can return home with the following  Assist for transportation;Assistance with cooking/housework;A lot of help with bathing/dressing/bathroom;A lot of help with walking and/or transfers   Equipment Recommendations  None recommended by OT    Recommendations for Other Services      Precautions / Restrictions Precautions Precautions: Fall Precaution Comments: loose stool Restrictions Weight Bearing Restrictions: No       Mobility Bed Mobility Overal bed mobility: Needs Assistance Bed Mobility: Supine to Sit     Supine to sit: HOB elevated, Min guard          Transfers Overall transfer level: Needs assistance Equipment used: Rolling walker (2 wheels) Transfers: Sit to/from Stand Sit to Stand: Min assist           General transfer comment: good power up, min A for steadying in standing at RW, able to stand multiple times for pericare     Balance                                            ADL either performed or assessed with clinical judgement   ADL Overall ADL's : Needs assistance/impaired                     Lower Body Dressing: Set up;Sitting/lateral leans Lower Body Dressing Details (indicate cue type and reason): able to don socks both feet seated eob without assistance Toilet Transfer: Minimal assistance;Stand-pivot;BSC/3in1 Toilet Transfer Details (indicate cue type and reason): good recall of hand placement without cues Front peri care min guard in standing, mod/max a for buttocks for thoroughness after BM  in standing.           Functional mobility during ADLs: Minimal assistance;+2 for safety/equipment General ADL Comments: moving well overall but self distracting creating greater safety risks as it slows the pace of desired tasks including pt. stopping mid task several times due to conversation.  does well with redirection but does not sustain and requires intermittent interventions to safety and task completion.    Extremity/Trunk Assessment Upper Extremity Assessment Upper Extremity Assessment: Defer to OT evaluation RUE Deficits / Details: decreased end range to shoulder flexion, pain with shoulder AROM RUE Sensation: WNL RUE Coordination: WNL LUE Deficits / Details: Generalized weakness with large bruise to dorsum of hand. LUE Sensation: WNL LUE Coordination: WNL   Lower Extremity Assessment Lower Extremity Assessment: Generalized weakness;Overall [facility] for tasks assessed   Cervical / Trunk Assessment Cervical / Trunk  Assessment: Kyphotic    Vision       Perception     Praxis      Cognition Arousal/Alertness: Awake/alert Behavior During Therapy: WFL for tasks assessed/performed Overall Cognitive Status: Impaired/Different from baseline Area of Impairment: Memory                     Memory: Decreased short-term memory         General Comments: pt very verbose,  and self distracting, requires repetition and teach back to insure full understanding, especially with teaching for offweighting her sacrum while sitting up in recliner        Exercises      Shoulder Instructions       General Comments HR in 160s with using BSC and ambulation in room, with standing for pericare pt noted to have blanchable red spot on her sacrum, RN brought to see and sacral foam placed, SpO2 on 2L O2 via [place] >90%O2    Pertinent Vitals/ Pain       Pain Assessment Pain Assessment: No/denies pain  Home Living Family/patient expects to be discharged to:: Private residence Living Arrangements: Alone Available Help at Discharge: Family;Available 24 hours/day Type of Home: Other(Comment) (condo) Home Access: Stairs to enter Entrance Stairs-Number of Steps: 3 Entrance Stairs-Rails: Right Home Layout: Able to live on main level with bedroom/bathroom;Multi-level Alternate Level Stairs-Number of Steps: patient has stair lift chair   Bathroom Shower/Tub: Producer, television/film/video: Standard Bathroom Accessibility: Yes   Home Equipment: Agricultural consultant (2 wheels);Cane - single point;Grab bars - tub/shower;Shower seat - built in          Prior Functioning/Environment              Frequency  Min 2X/week        Progress Toward Goals  OT Goals(current goals can now be found in the care plan section)  Progress towards OT goals: Progressing toward goals     Plan Discharge plan remains appropriate    Co-evaluation                 AM-PAC OT "6 Clicks" Daily Activity     Outcome Measure   Help from another person eating meals?: None Help from another person taking care of personal grooming?: A Little Help from another person toileting, which includes using toliet, bedpan, or urinal?: A Lot Help from another person bathing (including washing, rinsing, drying)?: A Lot Help from another person to put on and taking off regular upper body  clothing?: A Little Help from another person to put on and taking off regular lower body clothing?: A Lot 6 Click Score: 16    End of Session    OT Visit Diagnosis: Unsteadiness on feet (R26.81);Muscle weakness (generalized) (M62.81);Other symptoms and signs involving cognitive function   Activity Tolerance Patient tolerated treatment well   Patient Left in chair;with call bell/phone within reach;with chair alarm set   Nurse Communication Other (comment) (PT communicated with RN regarding skin check upper buttocks region)        Time: 6045-4098 OT Time Calculation (min): 17 min  Charges: OT General Charges $OT Visit: 1 Visit OT Treatments $Self Care/Home Management : 8-22 mins  Boneta Lucks, COTA/L Acute Rehabilitation [phone]   Alessandra Bevels Lorraine-COTA/L 10/22/2022, 12:29 PM

## 2022-10-22 NOTE — Progress Notes (Signed)
PROGRESS NOTE  Laura Mendez  WUJ:[id number] DOB: 1941/04/03 DOA: 10/19/2022 PCP: [name], MD   Brief Narrative: Patient is a 82 year old female with history of breast cancer, chronic diastolic CHF, A-fib, CVA who presented with complaint of diarrhea, nausea, vomiting ,abdominal pain.  On presentation, she was in A-fib with RVR with heart rate in the range of 150.  CT abdomen/pelvis was consistent of cholecystitis, general surgery consulted.  Started on amiodarone drip.  Cardiology following. S/p percutaneous cholecystostomy on 5/30 .  Assessment & Plan:  Principal Problem:   Acute cholecystitis Active Problems:   Asthma with COPD (HCC)   Paroxysmal atrial fibrillation (HCC)   Malignant neoplasm of lower-outer quadrant of right breast of female, estrogen receptor positive (HCC)   CVA (cerebral vascular accident) (HCC)   Atopic dermatitis   AKI (acute kidney injury) (HCC)   Pancreatic lesion   Acute cholecystitis: Presented with nausea, vomiting, abdominal pain.  Lab work showed leukocytosis with WBC count of 21,000, AKI with creatinine of 1.6, normal liver enzymes. CT abdomen/pelvis showed abnormal gallbladder with dilatation and stones,significant wall thickening, edema and adjacent stranding consistent with  acute cholecystitis. Possible stone in the neck of the gallbladder . General surgery consulted.  On zosyn.  Follow-up  blood cultures:NGTD  Leukocytosis resolved.  Lactic acidosis has resolved. IR did cholecystostomy drain placement.  General surgery signed off and follow-up as an outpatient.  Aerobic/anaerobic cultures showing few gram-positive cocci, gram-negative rods, will follow-up  A-fib with RVR: Takes Eliquis at home.  Heart rate in the range of 150 on presentation.  Started on amiodarone drip.  Cardiology consulted and following.  Eliquis resumed.  We will convert amiodarone to oral when appropriate  AKI: Presented with creatinine in the range of 1.6.   Kidney function  normalized with IV fluid.  Hypokalemia: Currently being supplemented and monitored.  History of CVA: History of mild to moderate dementia.  Continue delirium precautions  Asthma/COPD: Continue bronchodilators as needed  Atopic dermatitis: On Dupixent at home  History of breast cancer: S/p lumpectomy.  On letrozole at home.   Pancreatic lesion: 2.7 cm cystic lesion seen along the tail of the pancreas. Recommended follow up imaging in 6 months.  Generalized weakness: PT/OT consulted      DVT prophylaxis: Eliquis     Code Status: Full Code  Family Communication: Discussed with son on phone on 5/30  Patient status:Inpatient  Patient is from :home  Anticipated discharge NF:AOZH  Estimated DC date:1-2 days, after improvement in the heart rate   Consultants: Surgery,cardiology.IR  Procedures: Right upper quadrant drain placement  Antimicrobials:  Anti-infectives (From admission, onward)    Start     Dose/Rate Route Frequency Ordered Stop   10/19/22 1900  piperacillin-tazobactam (ZOSYN) IVPB 3.375 g        3.375 g 12.5 mL/hr over 240 Minutes Intravenous Every 8 hours 10/19/22 1459     10/19/22 1445  piperacillin-tazobactam (ZOSYN) IVPB 3.375 g  Status:  Discontinued        3.375 g 100 mL/hr over 30 Minutes Intravenous Every 8 hours 10/19/22 1444 10/19/22 1459   10/19/22 1300  piperacillin-tazobactam (ZOSYN) IVPB 3.375 g        3.375 g 100 mL/hr over 30 Minutes Intravenous  Once 10/19/22 1252 10/19/22 1348       Subjective: Patient seen and examined at bedside today.  Hemodynamically stable.  Remains in A-fib with heart rate ranging from 90-1 30.  Denies any chest pain.  Denies any abdominal  pain, nausea or vomiting.  Underwent right upper quadrant drain placement yesterday.  Objective: Vitals:   10/22/22 0200 10/22/22 0435 10/22/22 0744 10/22/22 0800  BP: (!) 92/52 (!) 88/40 (!) 100/50   Pulse: (!) 56 84 77   Resp: 20 20 20    Temp: 98.7 F  (37.1 C) 98.7 F (37.1 C) 97.6 F (36.4 C)   TempSrc: Oral Oral Oral   SpO2: 90% 98% 98% 97%  Weight:      Height:        Intake/Output Summary (Last 24 hours) at 10/22/2022 1152 Last data filed at 10/22/2022 0746 Gross per 24 hour  Intake 2069.06 ml  Output 1560 ml  Net 509.06 ml   Filed Weights   10/19/22 1600  Weight: 90 kg    Examination:   General exam: Overall comfortable, not in distress HEENT: PERRL Respiratory system:  no wheezes or crackles  Cardiovascular system: Irregularly irregular rhythm Gastrointestinal system: Abdomen is nondistended, soft and nontender.  Right upper quadrant drain Central nervous system: Alert and oriented Extremities: No edema, no clubbing ,no cyanosis Skin: No rashes, no ulcers,no icterus     Data Reviewed: I have personally reviewed following labs and imaging studies  CBC: Recent Labs  Lab 10/19/22 1123 10/19/22 1129 10/20/22 0225 10/20/22 1143 10/21/22 0207 10/21/22 0640 10/22/22 0207  WBC 21.6*  --  13.7* 12.4* 8.8 11.1* 10.4  NEUTROABS 18.3*  --   --   --   --   --   --   HGB 14.6   < > 11.9* 11.8* 9.7* 11.5* 11.0*  HCT 45.9   < > 36.9 36.8 31.8* 36.5 35.1*  MCV 89.8  --  90.7 92.7 95.2 90.8 91.2  PLT 259  --  226 200 156 192 201   < > = values in this interval not displayed.   Basic Metabolic Panel: Recent Labs  Lab 10/19/22 1123 10/19/22 1129 10/20/22 0225 10/20/22 1143 10/20/22 2145 10/21/22 0640 10/22/22 0207  NA 131* 134* 134*  --   --  136 131*  K 3.3* 3.3* 3.0*  --   --  3.3* 3.2*  CL 97* 100 99  --   --  101 96*  CO2 18*  --  22  --   --  23 23  GLUCOSE 152* 152* 139*  --   --  113* 443*  BUN 23 26* 19  --   --  10 7*  CREATININE 1.64* 1.50* 1.15*  --   --  0.97 1.03*  CALCIUM 8.8*  --  7.9*  --   --  8.2* 7.3*  MG  --   --   --  1.8 1.8  --   --      Recent Results (from the past 240 hour(s))  Blood culture (routine x 2)     Status: None (Preliminary result)   Collection Time: 10/19/22  1:01  PM   Specimen: BLOOD  Result Value Ref Range Status   Specimen Description BLOOD SITE NOT SPECIFIED  Final   Special Requests   Final    BOTTLES DRAWN AEROBIC AND ANAEROBIC Blood Culture adequate volume   Culture   Final    NO GROWTH 3 DAYS Performed at [facility], [address], [place], [place] [address]    Report Status PENDING  Incomplete  Blood culture (routine x 2)     Status: None (Preliminary result)   Collection Time: 10/19/22  1:06 PM   Specimen: BLOOD LEFT  ARM  Result Value Ref Range Status   Specimen Description BLOOD LEFT ARM  Final   Special Requests   Final    BOTTLES DRAWN AEROBIC AND ANAEROBIC Blood Culture adequate volume   Culture   Final    NO GROWTH 3 DAYS Performed at [facility], [address], [place], [place] [address]    Report Status PENDING  Incomplete  C Difficile Quick Screen w PCR reflex     Status: None   Collection Time: 10/21/22  7:10 AM   Specimen: STOOL  Result Value Ref Range Status   C Diff antigen NEGATIVE NEGATIVE Final   C Diff toxin NEGATIVE NEGATIVE Final   C Diff interpretation No C. difficile detected.  Final    Comment: Performed at [facility], [address] [place], [place] [address]  Aerobic/Anaerobic Culture w Gram Stain (surgical/deep wound)     Status: None (Preliminary result)   Collection Time: 10/21/22  4:10 PM   Specimen: BILE  Result Value Ref Range Status   Specimen Description BILE  Final   Special Requests FROM GALLBLADDER DRAIN  Final   Gram Stain   Final    ABUNDANT WBC PRESENT, PREDOMINANTLY PMN FEW GRAM POSITIVE COCCI IN CHAINS FEW GRAM NEGATIVE RODS    Culture   Final    TOO YOUNG TO READ Performed at [facility], [address] [place], [place] [address]    Report Status PENDING  Incomplete     Radiology Studies: IR Perc Cholecystostomy  Addendum Date: 10/21/2022   ADDENDUM REPORT: 10/21/2022 17:16 ADDENDUM: Fluoroscopic and ultrasound images were taken and saved for  documentation. Electronically Signed   By: Richarda Overlie M.D.   On: 10/21/2022 17:16   Result Date: 10/21/2022 INDICATION: 82 year old with cholelithiasis and concern for acute cholecystitis. Patient is not a good surgical candidate at this time. Plan for percutaneous cholecystostomy tube placement. EXAM: CHOLECYSTOSTOMY TUBE PLACEMENT WITH ULTRASOUND AND FLUOROSCOPIC GUIDANCE MEDICATIONS: Moderate sedation ANESTHESIA/SEDATION: Moderate (conscious) sedation was employed during this procedure. A total of Versed 0.5 mg and Fentanyl 25 mcg was administered intravenously by the radiology nurse. Total intra-service moderate Sedation Time: 18 minutes. The patient's level of consciousness and vital signs were monitored continuously by radiology nursing throughout the procedure under my direct supervision. FLUOROSCOPY: Radiation Exposure Index (as provided by the fluoroscopic device): 24 mGy Kerma CONTRAST:  10 mL Omnipaque 300 COMPLICATIONS: None immediate. PROCEDURE: Informed consent was obtained after thorough discussion of the potential risks and benefits. All questions were addressed. Maximal Sterile Barrier Technique was utilized including caps, mask, sterile gowns, sterile gloves, sterile drape, hand hygiene and skin antiseptic. A timeout was performed prior to the initiation of the procedure. Right side of the abdomen was prepped and draped in sterile fashion. Ultrasound was used to identify the gallbladder. Skin was anesthetized using 1% lidocaine. Small incision was made. Using ultrasound guidance, 21 gauge needle was directed into the gallbladder using a transhepatic approach. 0.018 wire was advanced in the gallbladder. Transitional dilator set was placed. Small amount of contrast was injected to confirm placement. J wire was placed and the tract was dilated to accommodate a 10 [place] multipurpose drain. 10 French drain was reconstituted in the gallbladder. 60 mL of cloudy orange fluid was removed. Gallbladder was  decompressed at end of the procedure. Drain was flushed with saline and attached to a gravity bag. Drain was sutured to skin. FINDINGS: Gallbladder was markedly abnormal on ultrasound with multiple stones and  asymmetric wall thickening. Drain was successfully placed using a transhepatic approach. Cholangiogram demonstrated multiple stones but there was filling of the common bile duct and duodenum. 60 mL of orange colored fluid was removed. Fluid was sent for culture. IMPRESSION: 1. Successful percutaneous cholecystostomy tube placement. 2. Cholelithiasis.  Cystic duct and common bile duct are patent. Electronically Signed: By: Richarda Overlie M.D. On: 10/21/2022 17:06    Scheduled Meds:  mometasone-formoterol  2 puff Inhalation BID   sodium chloride flush  3 mL Intravenous Q12H   sodium chloride flush  5 mL Intracatheter Q8H   Continuous Infusions:  amiodarone 30 mg/hr (10/21/22 2216)   piperacillin-tazobactam (ZOSYN)  IV 3.375 g (10/22/22 0616)     LOS: 3 days   Burnadette Pop, MD [facility] P5/31/2024, 11:52 AM

## 2022-10-22 NOTE — Evaluation (Signed)
Physical Therapy Evaluation Patient Details Name: Laura Mendez MRN: [id number] DOB: Oct 31, 1940 Today's Date: 10/22/2022  History of Present Illness  82 y.o. female with medical history significant of breast CA, chronic diastolic CHF, afib, and CVA presenting with 3-4 days of diarrhea. CT with acute cholecystitis.  Clinical Impression  PTA pt reports living alone in 2 story condo where her son just installed a chair lift. Pt reports no AD use in home and use of cane for community level ambulation. Pt reports independence in ADLs and iADLs, still driving , "getting up in the morning and getting out and doing."  No family present to corroborate. Pt is currently limited in safe mobility by decreased safety awareness, in presence of decreased strength, balance and  endurance. Pt will likely need some supervision initially at home especially if she continues to have loose stool. PT recommending continued therapy post acute care.       Recommendations for follow up therapy are one component of a multi-disciplinary discharge planning process, led by the attending physician.  Recommendations may be updated based on patient status, additional functional criteria and insurance authorization.     Assistance Recommended at Discharge Intermittent Supervision/Assistance  Patient can return home with the following  A little help with walking and/or transfers;A little help with bathing/dressing/bathroom;Assistance with cooking/housework;Direct supervision/assist for medications management;Direct supervision/assist for financial management;Assist for transportation;Help with stairs or ramp for entrance    Equipment Recommendations None recommended by PT  Recommendations for Other Services       Functional Status Assessment Patient has had a recent decline in their functional status and demonstrates the ability to make significant improvements in function in a reasonable and predictable amount of time.      Precautions / Restrictions Precautions Precautions: Fall Precaution Comments: loose stool Restrictions Weight Bearing Restrictions: No      Mobility  Bed Mobility               General bed mobility comments: up on BSC on entry working with OT    Transfers Overall transfer level: Needs assistance Equipment used: Rolling walker (2 wheels) Transfers: Sit to/from Stand Sit to Stand: Min assist           General transfer comment: good power up, min A for steadying in standing at RW, able to stand multiple times for pericare    Ambulation/Gait Ambulation/Gait assistance: Min assist, +2 safety/equipment Gait Distance (Feet): 12 Feet Assistive device: Rolling walker (2 wheels) Gait Pattern/deviations: Step-through pattern, Decreased step length - right, Decreased step length - left, Trunk flexed Gait velocity: slowed Gait velocity interpretation: <1.31 ft/sec, indicative of household ambulator   General Gait Details: ambulation limited by Afib with HR in 160s with use of BSC, min physical assist and assist for lines and leads for improved safety with ambulation around foot of bed to recliner. requires increased multimodal cues for bringing RW with her as she turned to sit in recliner. pt had to be interupted to give direction due to constant stream of conversation even with request to focus on task at hand        Balance Overall balance assessment: Needs assistance Sitting-balance support: Feet supported Sitting balance-Leahy Scale: Fair     Standing balance support: Bilateral upper extremity supported, During functional activity, Reliant on assistive device for balance Standing balance-Leahy Scale: Poor  Pertinent Vitals/Pain Pain Assessment Pain Assessment: No/denies pain    Home Living Family/patient expects to be discharged to:: Private residence Living Arrangements: Alone Available Help at Discharge:  Family;Available 24 hours/day Type of Home: Other(Comment) (condo) Home Access: Stairs to enter Entrance Stairs-Rails: Right Entrance Stairs-Number of Steps: 3 Alternate Level Stairs-Number of Steps: patient has stair lift chair Home Layout: Able to live on main level with bedroom/bathroom;Multi-level Home Equipment: Rolling Walker (2 wheels);Cane - single point;Grab bars - tub/shower;Shower seat - built in      Prior Function Prior Level of Function : Independent/Modified Independent;Driving;Patient poor historian/Family not available             Mobility Comments: Patient reports driving locally, son assists with groceries.  Patient stating she has a RW, but does not use it in the condo.  ? accuracy ADLs Comments: Patient reports being Ind with ADL and IADL.  ? accuracy.     Hand Dominance   Dominant Hand: Right    Extremity/Trunk Assessment   Upper Extremity Assessment Upper Extremity Assessment: Defer to OT evaluation RUE Deficits / Details: decreased end range to shoulder flexion, pain with shoulder AROM RUE Sensation: WNL RUE Coordination: WNL LUE Deficits / Details: Generalized weakness with large bruise to dorsum of hand. LUE Sensation: WNL LUE Coordination: WNL    Lower Extremity Assessment Lower Extremity Assessment: Generalized weakness;Overall [facility] for tasks assessed    Cervical / Trunk Assessment Cervical / Trunk Assessment: Kyphotic  Communication   Communication: No difficulties  Cognition Arousal/Alertness: Awake/alert Behavior During Therapy: WFL for tasks assessed/performed Overall Cognitive Status: Impaired/Different from baseline Area of Impairment: Memory                     Memory: Decreased short-term memory         General Comments: pt very verbose, and self distracting, requires repetition and teach back to insure full understanding, especially with teaching for offweighting her sacrum while sitting up in recliner         General Comments General comments (skin integrity, edema, etc.): HR in 160s with using BSC and ambulation in room, with standing for pericare pt noted to have blanchable red spot on her sacrum, RN brought to see and sacral foam placed, SpO2 on 2L O2 via [place] >90%O2        Assessment/Plan    PT Assessment Patient needs continued PT services  PT Problem List Decreased strength;Decreased activity tolerance;Decreased balance;Decreased mobility;Decreased cognition;Decreased safety awareness;Cardiopulmonary status limiting activity       PT Treatment Interventions DME instruction;Gait training;Stair training;Functional mobility training;Therapeutic activities;Therapeutic exercise;Balance training;Cognitive remediation;Patient/family education    PT Goals (Current goals can be found in the Care Plan section)  Acute Rehab PT Goals Patient Stated Goal: get back to doing things at home PT Goal Formulation: With patient Time For Goal Achievement: 11/05/22 Potential to Achieve Goals: Good    Frequency Min 1X/week        AM-PAC PT "6 Clicks" Mobility  Outcome Measure Help needed turning from your back to your side while in a flat bed without using bedrails?: None Help needed moving from lying on your back to sitting on the side of a flat bed without using bedrails?: A Little Help needed moving to and from a bed to a chair (including a wheelchair)?: A Little Help needed standing up from a chair using your arms (e.g., wheelchair or bedside chair)?: A Little Help needed to walk in hospital room?: A Little Help needed climbing  3-5 steps with a railing? : A Lot 6 Click Score: 18    End of Session Equipment Utilized During Treatment: Oxygen Activity Tolerance: Patient tolerated treatment well;Treatment limited secondary to medical complications (Comment) (HR in 160s) Patient left: in chair;with call bell/phone within reach;with chair alarm set Nurse Communication: Mobility status;Other (comment)  (sacral redness) PT Visit Diagnosis: Other abnormalities of gait and mobility (R26.89);Muscle weakness (generalized) (M62.81);Difficulty in walking, not elsewhere classified (R26.2)    Time: 1610-9604 PT Time Calculation (min) (ACUTE ONLY): 20 min   Charges:   PT Evaluation $PT Eval Moderate Complexity: 1 Mod          Chessica Audia B. Beverely Risen PT, DPT Acute Rehabilitation Services Please use secure chat or  Call Office [phone]   Elon Alas Fleet 10/22/2022, 12:01 PM

## 2022-10-22 NOTE — Progress Notes (Signed)
ANTICOAGULATION CONSULT NOTE  Pharmacy Consult for heparin Indication: atrial fibrillation  Heparin Dosing Weight: 88.9 kg  Labs: Recent Labs    10/19/22 1123 10/19/22 1129 10/20/22 0225 10/20/22 1143 10/21/22 0207 10/21/22 0640 10/22/22 0207  HGB 14.6   < > 11.9*   < > 9.7* 11.5* 11.0*  HCT 45.9   < > 36.9   < > 31.8* 36.5 35.1*  PLT 259  --  226   < > 156 192 201  APTT  --   --  58*   < > 157* 70* 69*  LABPROT 22.6*  --   --   --   --   --   --   INR 2.0*  --   --   --   --   --   --   HEPARINUNFRC  --   --  >1.10*  --   --  0.86* 0.30  CREATININE 1.64*   < > 1.15*  --   --  0.97 1.03*   < > = values in this interval not displayed.     Assessment: 82 yof on apixaban PTA for afib, transitioned to heparin infusion prior to IR procedure for percutaneous cholecystostomy tube placement 5/30. Heparin paused at 1300 prior to procedure.  PM update - per discussion with IR MD Lowella Dandy), resume heparin 2 hours post-procedure (procedure ended ~1615 per RN). Previously therapeutic on heparin at 1500 units/hr. CBC stable. No bleed issues reported. Monitor using aPTT while apixaban influencing heparin levels.  5/31 AM update:  Heparin level therapeutic  aPTT/heparin level correlating-will DC further aPTT's  Goal of Therapy:  Heparin level 0.3-0.7 units/mL Monitor platelets by anticoagulation protocol: Yes   Plan:  Cont heparin 1500 units/hr Daily CBC/heparin level Monitor for bleeding  Abran Duke, PharmD, BCPS Clinical Pharmacist Phone: [phone]

## 2022-10-22 NOTE — Progress Notes (Signed)
Rounding Note    Patient Name: Laura Mendez Date of Encounter: 10/22/2022  [facility] Cardiologist: Donato Schultz, MD   Subjective   Asymptomatic  Inpatient Medications    Scheduled Meds:  mometasone-formoterol  2 puff Inhalation BID   potassium chloride  40 mEq Oral Once   sodium chloride flush  3 mL Intravenous Q12H   sodium chloride flush  5 mL Intracatheter Q8H   Continuous Infusions:  sodium chloride 75 mL/hr at 10/22/22 0441   amiodarone 30 mg/hr (10/21/22 2216)   heparin 1,500 Units/hr (10/22/22 0359)   piperacillin-tazobactam (ZOSYN)  IV 3.375 g (10/22/22 0616)   PRN Meds: acetaminophen **OR** acetaminophen, albuterol, hydrALAZINE, ondansetron **OR** ondansetron (ZOFRAN) IV   Vital Signs    Vitals:   10/22/22 0200 10/22/22 0435 10/22/22 0744 10/22/22 0800  BP: (!) 92/52 (!) 88/40 (!) 100/50   Pulse: (!) 56 84 77   Resp: 20 20 20    Temp: 98.7 F (37.1 C) 98.7 F (37.1 C) 97.6 F (36.4 C)   TempSrc: Oral Oral Oral   SpO2: 90% 98% 98% 97%  Weight:      Height:        Intake/Output Summary (Last 24 hours) at 10/22/2022 0909 Last data filed at 10/22/2022 0746 Gross per 24 hour  Intake 2342.4 ml  Output 1560 ml  Net 782.4 ml      10/19/2022    4:00 PM 10/05/2022   10:53 AM 07/29/2022    9:34 AM  Last 3 Weights  Weight (lbs) 198 lb 6.6 oz 212 lb 203 lb  Weight (kg) 90 kg 96.163 kg 92.08 kg      Telemetry    Afib now more rate controlled, intermittent sinus - Personally Reviewed  ECG    10/21/2022 7:42 am: afib 109 10/20/2022- 9:50am : afib 155  - Personally Reviewed  Physical Exam   Vitals:   10/22/22 0744 10/22/22 0800  BP: (!) 100/50   Pulse: 77   Resp: 20   Temp: 97.6 F (36.4 C)   SpO2: 98% 97%    GEN: No acute distress.   Neck: No JVD Cardiac: IRRR, no murmurs, rubs, or gallops.  Respiratory: Clear to auscultation bilaterally. GI: Soft, nontender, non-distended ; perc drain with minimal serosanguinous  fluid MS: No edema; No deformity. Neuro:  Nonfocal  Psych: Normal affect   Labs    High Sensitivity Troponin:  No results for input(s): "TROPONINIHS" in the last 720 hours.   Chemistry Recent Labs  Lab 10/19/22 1123 10/19/22 1129 10/20/22 0225 10/20/22 1143 10/20/22 2145 10/21/22 0640 10/22/22 0207  NA 131*   < > 134*  --   --  136 131*  K 3.3*   < > 3.0*  --   --  3.3* 3.2*  CL 97*   < > 99  --   --  101 96*  CO2 18*  --  22  --   --  23 23  GLUCOSE 152*   < > 139*  --   --  113* 443*  BUN 23   < > 19  --   --  10 7*  CREATININE 1.64*   < > 1.15*  --   --  0.97 1.03*  CALCIUM 8.8*  --  7.9*  --   --  8.2* 7.3*  MG  --   --   --  1.8 1.8  --   --   PROT 7.0  --   --   --   --   --  4.8*  ALBUMIN 3.1*  --   --   --   --   --  1.9*  AST 36  --   --   --   --   --  31  ALT 20  --   --   --   --   --  21  ALKPHOS 77  --   --   --   --   --  44  BILITOT 1.2  --   --   --   --   --  0.3  GFRNONAA 31*  --  48*  --   --  58* 54*  ANIONGAP 16*  --  13  --   --  12 12   < > = values in this interval not displayed.    Lipids No results for input(s): "CHOL", "TRIG", "HDL", "LABVLDL", "LDLCALC", "CHOLHDL" in the last 168 hours.  Hematology Recent Labs  Lab 10/21/22 0207 10/21/22 0640 10/22/22 0207  WBC 8.8 11.1* 10.4  RBC 3.34* 4.02 3.85*  HGB 9.7* 11.5* 11.0*  HCT 31.8* 36.5 35.1*  MCV 95.2 90.8 91.2  MCH 29.0 28.6 28.6  MCHC 30.5 31.5 31.3  RDW 14.4 14.3 14.4  PLT 156 192 201   Thyroid No results for input(s): "TSH", "FREET4" in the last 168 hours.  BNPNo results for input(s): "BNP", "PROBNP" in the last 168 hours.  DDimer No results for input(s): "DDIMER" in the last 168 hours.   Radiology    IR Perc Cholecystostomy  Addendum Date: 10/21/2022   ADDENDUM REPORT: 10/21/2022 17:16 ADDENDUM: Fluoroscopic and ultrasound images were taken and saved for documentation. Electronically Signed   By: Richarda Overlie M.D.   On: 10/21/2022 17:16   Result Date:  10/21/2022 INDICATION: 82 year old with cholelithiasis and concern for acute cholecystitis. Patient is not a good surgical candidate at this time. Plan for percutaneous cholecystostomy tube placement. EXAM: CHOLECYSTOSTOMY TUBE PLACEMENT WITH ULTRASOUND AND FLUOROSCOPIC GUIDANCE MEDICATIONS: Moderate sedation ANESTHESIA/SEDATION: Moderate (conscious) sedation was employed during this procedure. A total of Versed 0.5 mg and Fentanyl 25 mcg was administered intravenously by the radiology nurse. Total intra-service moderate Sedation Time: 18 minutes. The patient's level of consciousness and vital signs were monitored continuously by radiology nursing throughout the procedure under my direct supervision. FLUOROSCOPY: Radiation Exposure Index (as provided by the fluoroscopic device): 24 mGy Kerma CONTRAST:  10 mL Omnipaque 300 COMPLICATIONS: None immediate. PROCEDURE: Informed consent was obtained after thorough discussion of the potential risks and benefits. All questions were addressed. Maximal Sterile Barrier Technique was utilized including caps, mask, sterile gowns, sterile gloves, sterile drape, hand hygiene and skin antiseptic. A timeout was performed prior to the initiation of the procedure. Right side of the abdomen was prepped and draped in sterile fashion. Ultrasound was used to identify the gallbladder. Skin was anesthetized using 1% lidocaine. Small incision was made. Using ultrasound guidance, 21 gauge needle was directed into the gallbladder using a transhepatic approach. 0.018 wire was advanced in the gallbladder. Transitional dilator set was placed. Small amount of contrast was injected to confirm placement. J wire was placed and the tract was dilated to accommodate a 10 [place] multipurpose drain. 10 French drain was reconstituted in the gallbladder. 60 mL of cloudy orange fluid was removed. Gallbladder was decompressed at end of the procedure. Drain was flushed with saline and attached to a gravity bag.  Drain was sutured to skin. FINDINGS: Gallbladder was markedly abnormal on ultrasound with multiple stones and asymmetric wall thickening. Drain  was successfully placed using a transhepatic approach. Cholangiogram demonstrated multiple stones but there was filling of the common bile duct and duodenum. 60 mL of orange colored fluid was removed. Fluid was sent for culture. IMPRESSION: 1. Successful percutaneous cholecystostomy tube placement. 2. Cholelithiasis.  Cystic duct and common bile duct are patent. Electronically Signed: By: Richarda Overlie M.D. On: 10/21/2022 17:06    Cardiac Studies   TTE 11/23/2021 1. Left ventricular ejection fraction, by estimation, is 60 to 65%. The  left ventricle has normal function. The left ventricle has no regional  wall motion abnormalities. There is mild left ventricular hypertrophy.  Left ventricular diastolic parameters  are consistent with Grade I diastolic dysfunction (impaired relaxation).  Elevated left atrial pressure.   2. Right ventricular systolic function is normal. The right ventricular  size is normal. There is mildly elevated pulmonary artery systolic  pressure.   3. Left atrial size was severely dilated.   4. The mitral valve is normal in structure. Trivial mitral valve  regurgitation. No evidence of mitral stenosis. Severe mitral annular  calcification.   5. The aortic valve is tricuspid. Aortic valve regurgitation is not  visualized. Aortic valve sclerosis is present, with no evidence of aortic  valve stenosis.   6. The inferior vena cava is dilated in size with >50% respiratory  variability, suggesting right atrial pressure of 8 mmHg.   LHC 11/23/2021 1) Patent coronary arteries, right dominant, with mild irregularity but no significant stenoses. 2) Normal LVEDP  Suspect demand ischemic event. The patient does not have any high-grade CAD. Recommend medical therapy. Resume heparin tonight. Likely transition to apixaban again tomorrow.   Patient  Profile     Coby Bartoli is a 82 y.o. 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 Renford Dills.   Assessment & Plan    Afib with RVR: in the setting of acute cholecystitis/SIRS. She has known history of afib. CHADS2vasc=5. She follows with Dr. Anne Fu. - converted to sinus rhythm a few days ago and was switched to oral, she went back into afib and had RVR and was restarted on IV amiodarone - recommend continuing IV amiodarone rates controlled < 110 bpm; she is going in and out of sinus, hopefully as she continues to improve she may convert - no plans for mechanical restoration of sinus rhythm with perc drain and need for uninterrupted AC - Bps too soft to uptitrate rate control - home eliquis is held , on heparin gtt; can restart AC once ok'd by gen surgery   For questions or updates, please contact [facility] Please consult [email or web address] for contact info under        Signed, Maisie Fus, MD  10/22/2022, 9:09 AM

## 2022-10-23 DIAGNOSIS — K810 Acute cholecystitis: Secondary | ICD-10-CM | POA: Diagnosis not present

## 2022-10-23 LAB — BASIC METABOLIC PANEL
Anion gap: 9 (ref 5–15)
BUN: 10 mg/dL (ref 8–23)
CO2: 22 mmol/L (ref 22–32)
Calcium: 8.3 mg/dL — ABNORMAL LOW (ref 8.9–10.3)
Chloride: 105 mmol/L (ref 98–111)
Creatinine, Ser: 0.93 mg/dL (ref 0.44–1.00)
GFR, Estimated: >60 mL/min (ref >60)
Glucose, Bld: 108 mg/dL — ABNORMAL HIGH (ref 70–99)
Potassium: 3.3 mmol/L — ABNORMAL LOW (ref 3.5–5.1)
Sodium: 136 mmol/L (ref 135–145)

## 2022-10-23 LAB — CULTURE, BLOOD (ROUTINE X 2)

## 2022-10-23 LAB — AEROBIC/ANAEROBIC CULTURE W GRAM STAIN (SURGICAL/DEEP WOUND)

## 2022-10-23 MED ORDER — POTASSIUM CHLORIDE CRYS ER 20 MEQ PO TBCR
40.0000 meq | EXTENDED_RELEASE_TABLET | Freq: Once | ORAL | Status: AC
  Administered 2022-10-23: 40 meq via ORAL
  Filled 2022-10-23: qty 2

## 2022-10-23 MED ORDER — LOPERAMIDE HCL 2 MG PO CAPS: 2.0000 mg | ORAL_CAPSULE | Freq: Four times a day (QID) | ORAL | Status: DC | PRN

## 2022-10-23 NOTE — Progress Notes (Signed)
   Rounding Note    Patient Name: Laura Mendez Date of Encounter: 10/23/2022  [facility] Cardiologist: Donato Schultz, MD   Subjective   NAEO.   Vital Signs    Vitals:   10/22/22 2326 10/23/22 0309 10/23/22 0633 10/23/22 0916  BP: 107/88  102/68   Pulse: 87 69 (!) 56 100  Resp: 20 20 20 15   Temp: 98.4 F (36.9 C) 98.4 F (36.9 C)  98.4 F (36.9 C)  TempSrc: Oral Oral  Oral  SpO2: 96%  96% 92%  Weight:      Height:        Intake/Output Summary (Last 24 hours) at 10/23/2022 1022 Last data filed at 10/23/2022 0900 Gross per 24 hour  Intake 1122.24 ml  Output 1000 ml  Net 122.24 ml      10/19/2022    4:00 PM 10/05/2022   10:53 AM 07/29/2022    9:34 AM  Last 3 Weights  Weight (lbs) 198 lb 6.6 oz 212 lb 203 lb  Weight (kg) 90 kg 96.163 kg 92.08 kg      Telemetry    AF with ates 100s - Personally Reviewed  ECG    Personally Reviewed  Physical Exam    GEN: No acute distress.  elderly Cardiac: irregularly irregular, no murmurs, rubs, or gallops.  Respiratory: Clear to auscultation bilaterally. Psych: Normal affect   Assessment & Plan    [name] is an 82 yo woman admitted with acute cholecystitis found to have AF w RVR. She does have a history of AF.  #AF Her RVR is likely 2/2 acute cholecystitis. Cont eliquis Cont IV amiodarone for now. Plan transition to oral amiodarone at discharge. No DCCV planned. If remains in AF at follow up, can consider DCCV.     Sheria Lang T. Lalla Brothers, MD, [facility], [facility] Cardiac Electrophysiology

## 2022-10-23 NOTE — Progress Notes (Signed)
PROGRESS NOTE  Laura Mendez  ZOX:[id number] DOB: 1940/12/20 DOA: 10/19/2022 PCP: [name], MD   Brief Narrative: Patient is a 82 year old female with history of breast cancer, chronic diastolic CHF, A-fib, CVA who presented with complaint of diarrhea, nausea, vomiting ,abdominal pain.  On presentation, she was in A-fib with RVR with heart rate in the range of 150.  CT abdomen/pelvis was consistent of cholecystitis, general surgery consulted.  Started on amiodarone drip.  Cardiology following. S/p percutaneous cholecystostomy on 5/30 .  Remains in A-fib with RVR.  Assessment & Plan:  Principal Problem:   Acute cholecystitis Active Problems:   Asthma with COPD (HCC)   Paroxysmal atrial fibrillation (HCC)   Malignant neoplasm of lower-outer quadrant of right breast of female, estrogen receptor positive (HCC)   CVA (cerebral vascular accident) (HCC)   Atopic dermatitis   AKI (acute kidney injury) (HCC)   Pancreatic lesion   Acute cholecystitis: Presented with nausea, vomiting, abdominal pain.  Lab work showed leukocytosis with WBC count of 21,000, AKI with creatinine of 1.6, normal liver enzymes. CT abdomen/pelvis showed abnormal gallbladder with dilatation and stones,significant wall thickening, edema and adjacent stranding consistent with  acute cholecystitis. Possible stone in the neck of the gallbladder . General surgery consulted.  On zosyn.  Follow-up  blood cultures:NGTD  Leukocytosis resolved.  Lactic acidosis has resolved. IR did cholecystostomy drain placement.  General surgery signed off and follow-up as an outpatient.  Aerobic/anaerobic cultures showing few gram-positive cocci, gram-negative rods, will follow-up.  A-fib with RVR: Takes Eliquis at home.  Heart rate in the range of 150 on presentation.  Started on amiodarone drip.  Cardiology consulted and following.  Eliquis resumed.  We will convert amiodarone to oral when appropriate.  Remains in RVR  AKI:  Presented with creatinine in the range of 1.6.  Kidney function  normalized with IV fluid.  Hypokalemia: Currently being supplemented and monitored.  History of CVA: History of mild to moderate dementia.  Continue delirium precautions  Asthma/COPD: Continue bronchodilators as needed  Atopic dermatitis: On Dupixent at home  History of breast cancer: S/p lumpectomy.  On letrozole at home.   Pancreatic lesion: 2.7 cm cystic lesion seen along the tail of the pancreas. Recommended follow up imaging in 6 months.  Generalized weakness: PT/OT consulted, recommended home health      DVT prophylaxis: Eliquis apixaban (ELIQUIS) tablet 5 mg     Code Status: Full Code  Family Communication: Discussed with son on phone on 6/1  Patient status:Inpatient  Patient is from :home  Anticipated discharge WJ:XBJY  Estimated DC date:1-2 days, after improvement in the heart rate   Consultants: Surgery,cardiology.IR  Procedures: Right upper quadrant drain placement  Antimicrobials:  Anti-infectives (From admission, onward)    Start     Dose/Rate Route Frequency Ordered Stop   10/19/22 1900  piperacillin-tazobactam (ZOSYN) IVPB 3.375 g        3.375 g 12.5 mL/hr over 240 Minutes Intravenous Every 8 hours 10/19/22 1459     10/19/22 1445  piperacillin-tazobactam (ZOSYN) IVPB 3.375 g  Status:  Discontinued        3.375 g 100 mL/hr over 30 Minutes Intravenous Every 8 hours 10/19/22 1444 10/19/22 1459   10/19/22 1300  piperacillin-tazobactam (ZOSYN) IVPB 3.375 g        3.375 g 100 mL/hr over 30 Minutes Intravenous  Once 10/19/22 1252 10/19/22 1348       Subjective: Patient seen and examined at bedside today.  Hemodynamically stable comfortable.  She feels better.  Denies any abdomen pain, nausea or vomiting.  Heart rate remains in the range of 120, 130  Objective: Vitals:   10/22/22 2326 10/23/22 0309 10/23/22 0633 10/23/22 0916  BP: 107/88  102/68   Pulse: 87 69 (!) 56 100  Resp: 20 20  20 15   Temp: 98.4 F (36.9 C) 98.4 F (36.9 C)  98.4 F (36.9 C)  TempSrc: Oral Oral  Oral  SpO2: 96%  96% 92%  Weight:      Height:        Intake/Output Summary (Last 24 hours) at 10/23/2022 1107 Last data filed at 10/23/2022 0900 Gross per 24 hour  Intake 1122.24 ml  Output 1000 ml  Net 122.24 ml   Filed Weights   10/19/22 1600  Weight: 90 kg    Examination:  General exam: Overall comfortable, not in distress, pleasant elderly female HEENT: PERRL Respiratory system:  no wheezes or crackles  Cardiovascular system: A-fib with RVR Gastrointestinal system: Abdomen is nondistended, soft and nontender.  Right upper quadrant drain Central nervous system: Alert and oriented Extremities: No edema, no clubbing ,no cyanosis Skin: No rashes, no ulcers,no icterus     Data Reviewed: I have personally reviewed following labs and imaging studies  CBC: Recent Labs  Lab 10/19/22 1123 10/19/22 1129 10/20/22 0225 10/20/22 1143 10/21/22 0207 10/21/22 0640 10/22/22 0207  WBC 21.6*  --  13.7* 12.4* 8.8 11.1* 10.4  NEUTROABS 18.3*  --   --   --   --   --   --   HGB 14.6   < > 11.9* 11.8* 9.7* 11.5* 11.0*  HCT 45.9   < > 36.9 36.8 31.8* 36.5 35.1*  MCV 89.8  --  90.7 92.7 95.2 90.8 91.2  PLT 259  --  226 200 156 192 201   < > = values in this interval not displayed.   Basic Metabolic Panel: Recent Labs  Lab 10/19/22 1123 10/19/22 1129 10/20/22 0225 10/20/22 1143 10/20/22 2145 10/21/22 0640 10/22/22 0207 10/23/22 0102  NA 131* 134* 134*  --   --  136 131* 136  K 3.3* 3.3* 3.0*  --   --  3.3* 3.2* 3.3*  CL 97* 100 99  --   --  101 96* 105  CO2 18*  --  22  --   --  23 23 22   GLUCOSE 152* 152* 139*  --   --  113* 443* 108*  BUN 23 26* 19  --   --  10 7* 10  CREATININE 1.64* 1.50* 1.15*  --   --  0.97 1.03* 0.93  CALCIUM 8.8*  --  7.9*  --   --  8.2* 7.3* 8.3*  MG  --   --   --  1.8 1.8  --   --   --      Recent Results (from the past 240 hour(s))  Blood culture  (routine x 2)     Status: None (Preliminary result)   Collection Time: 10/19/22  1:01 PM   Specimen: BLOOD  Result Value Ref Range Status   Specimen Description BLOOD SITE NOT SPECIFIED  Final   Special Requests   Final    BOTTLES DRAWN AEROBIC AND ANAEROBIC Blood Culture adequate volume   Culture   Final    NO GROWTH 4 DAYS Performed at [facility], [address] [place], [place] [address]    Report Status PENDING  Incomplete  Blood culture (routine x 2)  Status: None (Preliminary result)   Collection Time: 10/19/22  1:06 PM   Specimen: BLOOD LEFT ARM  Result Value Ref Range Status   Specimen Description BLOOD LEFT ARM  Final   Special Requests   Final    BOTTLES DRAWN AEROBIC AND ANAEROBIC Blood Culture adequate volume   Culture   Final    NO GROWTH 4 DAYS Performed at [facility], [address] [place], [place] [address]    Report Status PENDING  Incomplete  C Difficile Quick Screen w PCR reflex     Status: None   Collection Time: 10/21/22  7:10 AM   Specimen: STOOL  Result Value Ref Range Status   C Diff antigen NEGATIVE NEGATIVE Final   C Diff toxin NEGATIVE NEGATIVE Final   C Diff interpretation No C. difficile detected.  Final    Comment: Performed at [facility], [address] [place], [place] [address]  Aerobic/Anaerobic Culture w Gram Stain (surgical/deep wound)     Status: None (Preliminary result)   Collection Time: 10/21/22  4:10 PM   Specimen: BILE  Result Value Ref Range Status   Specimen Description BILE  Final   Special Requests FROM GALLBLADDER DRAIN  Final   Gram Stain   Final    ABUNDANT WBC PRESENT, PREDOMINANTLY PMN FEW GRAM POSITIVE COCCI IN CHAINS FEW GRAM NEGATIVE RODS    Culture   Final    TOO YOUNG TO READ Performed at [facility], [address], [place], [place] [address]    Report Status PENDING  Incomplete     Radiology Studies: IR Perc Cholecystostomy  Addendum Date: 10/21/2022   ADDENDUM REPORT:  10/21/2022 17:16 ADDENDUM: Fluoroscopic and ultrasound images were taken and saved for documentation. Electronically Signed   By: Richarda Overlie M.D.   On: 10/21/2022 17:16   Result Date: 10/21/2022 INDICATION: 82 year old with cholelithiasis and concern for acute cholecystitis. Patient is not a good surgical candidate at this time. Plan for percutaneous cholecystostomy tube placement. EXAM: CHOLECYSTOSTOMY TUBE PLACEMENT WITH ULTRASOUND AND FLUOROSCOPIC GUIDANCE MEDICATIONS: Moderate sedation ANESTHESIA/SEDATION: Moderate (conscious) sedation was employed during this procedure. A total of Versed 0.5 mg and Fentanyl 25 mcg was administered intravenously by the radiology nurse. Total intra-service moderate Sedation Time: 18 minutes. The patient's level of consciousness and vital signs were monitored continuously by radiology nursing throughout the procedure under my direct supervision. FLUOROSCOPY: Radiation Exposure Index (as provided by the fluoroscopic device): 24 mGy Kerma CONTRAST:  10 mL Omnipaque 300 COMPLICATIONS: None immediate. PROCEDURE: Informed consent was obtained after thorough discussion of the potential risks and benefits. All questions were addressed. Maximal Sterile Barrier Technique was utilized including caps, mask, sterile gowns, sterile gloves, sterile drape, hand hygiene and skin antiseptic. A timeout was performed prior to the initiation of the procedure. Right side of the abdomen was prepped and draped in sterile fashion. Ultrasound was used to identify the gallbladder. Skin was anesthetized using 1% lidocaine. Small incision was made. Using ultrasound guidance, 21 gauge needle was directed into the gallbladder using a transhepatic approach. 0.018 wire was advanced in the gallbladder. Transitional dilator set was placed. Small amount of contrast was injected to confirm placement. J wire was placed and the tract was dilated to accommodate a 10 [place] multipurpose drain. 10 French drain was  reconstituted in the gallbladder. 60 mL of cloudy orange fluid was removed. Gallbladder was decompressed at end of the procedure. Drain was flushed with saline and attached to a gravity  bag. Drain was sutured to skin. FINDINGS: Gallbladder was markedly abnormal on ultrasound with multiple stones and asymmetric wall thickening. Drain was successfully placed using a transhepatic approach. Cholangiogram demonstrated multiple stones but there was filling of the common bile duct and duodenum. 60 mL of orange colored fluid was removed. Fluid was sent for culture. IMPRESSION: 1. Successful percutaneous cholecystostomy tube placement. 2. Cholelithiasis.  Cystic duct and common bile duct are patent. Electronically Signed: By: Richarda Overlie M.D. On: 10/21/2022 17:06    Scheduled Meds:  apixaban  5 mg Oral BID   mometasone-formoterol  2 puff Inhalation BID   sodium chloride flush  3 mL Intravenous Q12H   sodium chloride flush  5 mL Intracatheter Q8H   Continuous Infusions:  amiodarone 30 mg/hr (10/22/22 2311)   piperacillin-tazobactam (ZOSYN)  IV 3.375 g (10/23/22 4782)     LOS: 4 days   Burnadette Pop, MD [facility] P6/05/2022, 11:07 AM

## 2022-10-24 ENCOUNTER — Inpatient Hospital Stay (HOSPITAL_COMMUNITY): Payer: Medicare Other

## 2022-10-24 ENCOUNTER — Other Ambulatory Visit: Payer: Self-pay

## 2022-10-24 DIAGNOSIS — K810 Acute cholecystitis: Secondary | ICD-10-CM | POA: Diagnosis not present

## 2022-10-24 LAB — BASIC METABOLIC PANEL
Anion gap: 7 (ref 5–15)
BUN: 9 mg/dL (ref 8–23)
CO2: 23 mmol/L (ref 22–32)
Calcium: 8.3 mg/dL — ABNORMAL LOW (ref 8.9–10.3)
Chloride: 108 mmol/L (ref 98–111)
Creatinine, Ser: 0.84 mg/dL (ref 0.44–1.00)
GFR, Estimated: >60 mL/min (ref >60)
Glucose, Bld: 110 mg/dL — ABNORMAL HIGH (ref 70–99)
Potassium: 3.6 mmol/L (ref 3.5–5.1)
Sodium: 138 mmol/L (ref 135–145)

## 2022-10-24 LAB — CULTURE, BLOOD (ROUTINE X 2)
Culture: NO GROWTH
Special Requests: ADEQUATE

## 2022-10-24 MED ORDER — AMOXICILLIN-POT CLAVULANATE 875-125 MG PO TABS
1.0000 | ORAL_TABLET | Freq: Two times a day (BID) | ORAL | Status: DC
  Administered 2022-10-24 – 2022-10-26 (×5): 1 via ORAL
  Filled 2022-10-24 (×5): qty 1

## 2022-10-24 MED ORDER — SODIUM CHLORIDE 0.9% FLUSH
10.0000 mL | Freq: Two times a day (BID) | INTRAVENOUS | Status: DC
  Administered 2022-10-24: 10 mL
  Administered 2022-10-24: 20 mL
  Administered 2022-10-25 – 2022-10-27 (×5): 10 mL

## 2022-10-24 MED ORDER — SODIUM CHLORIDE 0.9% FLUSH: 10.0000 mL | INTRAVENOUS | Status: DC | PRN

## 2022-10-24 MED ORDER — CHLORHEXIDINE GLUCONATE CLOTH 2 % EX PADS
6.0000 | MEDICATED_PAD | Freq: Every day | CUTANEOUS | Status: DC
  Administered 2022-10-24 – 2022-10-27 (×4): 6 via TOPICAL

## 2022-10-24 NOTE — Progress Notes (Signed)
PROGRESS NOTE  Laura Mendez  ZOX:[id number] DOB: April 29, 1941 DOA: 10/19/2022 PCP: [name], MD   Brief Narrative: Patient is a 82 year old female with history of breast cancer, chronic diastolic CHF, A-fib, CVA who presented with complaint of diarrhea, nausea, vomiting ,abdominal pain.  On presentation, she was in A-fib with RVR with heart rate in the range of 150.  CT abdomen/pelvis was consistent of cholecystitis, general surgery consulted.  Started on amiodarone drip.  Cardiology following. S/p percutaneous cholecystostomy on 5/30 .  Remains in A-fib with RVR.  Assessment & Plan:  Principal Problem:   Acute cholecystitis Active Problems:   Asthma with COPD (HCC)   Paroxysmal atrial fibrillation (HCC)   Malignant neoplasm of lower-outer quadrant of right breast of female, estrogen receptor positive (HCC)   CVA (cerebral vascular accident) (HCC)   Atopic dermatitis   AKI (acute kidney injury) (HCC)   Pancreatic lesion   Acute cholecystitis: Presented with nausea, vomiting, abdominal pain.  Lab work showed leukocytosis with WBC count of 21,000, AKI with creatinine of 1.6, normal liver enzymes. CT abdomen/pelvis showed abnormal gallbladder with dilatation and stones,significant wall thickening, edema and adjacent stranding consistent with  acute cholecystitis. Possible stone in the neck of the gallbladder . General surgery consulted.  Started on zosyn. Follow-up  blood cultures:NGTD  Leukocytosis resolved.  Lactic acidosis has resolved. IR did cholecystostomy drain placement.  General surgery signed off and follow-up as an outpatient.  Aerobic/anaerobic cultures showing few gram-positive cocci, gram-negative rods, will follow-up.  Antibiotics changed to Augmentin  A-fib with RVR: Takes Eliquis at home.  Heart rate in the range of 150 on presentation.  Started on amiodarone drip.  Cardiology consulted and following.  Eliquis resumed.  We will convert amiodarone to oral  when appropriate.  Remains in RVR  AKI: Presented with creatinine in the range of 1.6.  Kidney function  normalized with IV fluid.  Hypokalemia: Currently being  monitored and supplemented as needed  History of CVA: History of mild to moderate dementia.  Continue delirium precautions  Asthma/COPD: Continue bronchodilators as needed  Atopic dermatitis: On Dupixent at home  History of breast cancer: S/p lumpectomy.  On letrozole at home.   Pancreatic lesion: 2.7 cm cystic lesion seen along the tail of the pancreas. Recommended follow up imaging in 6 months.  Generalized weakness: PT/OT consulted, recommended home health      DVT prophylaxis: Eliquis apixaban (ELIQUIS) tablet 5 mg     Code Status: Full Code  Family Communication: Discussed with son on phone on 6/2  Patient status:Inpatient  Patient is from :home  Anticipated discharge WJ:XBJY  Estimated DC date:1-2 days, after improvement in the heart rate   Consultants: Surgery,cardiology.IR  Procedures: Right upper quadrant drain placement  Antimicrobials:  Anti-infectives (From admission, onward)    Start     Dose/Rate Route Frequency Ordered Stop   10/24/22 1200  amoxicillin-clavulanate (AUGMENTIN) 875-125 MG per tablet 1 tablet        1 tablet Oral Every 12 hours 10/24/22 1107     10/19/22 1900  piperacillin-tazobactam (ZOSYN) IVPB 3.375 g  Status:  Discontinued        3.375 g 12.5 mL/hr over 240 Minutes Intravenous Every 8 hours 10/19/22 1459 10/24/22 1107   10/19/22 1445  piperacillin-tazobactam (ZOSYN) IVPB 3.375 g  Status:  Discontinued        3.375 g 100 mL/hr over 30 Minutes Intravenous Every 8 hours 10/19/22 1444 10/19/22 1459   10/19/22 1300  piperacillin-tazobactam (ZOSYN) IVPB  3.375 g        3.375 g 100 mL/hr over 30 Minutes Intravenous  Once 10/19/22 1252 10/19/22 1348       Subjective: Patient seen and examined at bedside today.  Appears comfortable.  Denies any abdomen pain, nausea or  vomiting.  Heart rate is still in the range of 120s.  Blood pressure is soft but stable.  She feels better and denies new complaints.  She had a bowel movement today.  PICC line placed this morning  Objective: Vitals:   10/23/22 2346 10/24/22 0346 10/24/22 0357 10/24/22 0737  BP: 102/63 (!) 93/50 (!) 91/58 112/67  Pulse:    (!) 111  Resp: 20 20 20 20   Temp: 98 F (36.7 C) 98 F (36.7 C) 98 F (36.7 C) 97.8 F (36.6 C)  TempSrc: Oral Oral Oral Oral  SpO2: 95% 95% 95% 96%  Weight:      Height:        Intake/Output Summary (Last 24 hours) at 10/24/2022 1107 Last data filed at 10/24/2022 1610 Gross per 24 hour  Intake 667.42 ml  Output 1230 ml  Net -562.58 ml   Filed Weights   10/19/22 1600  Weight: 90 kg    Examination:  General exam: Overall comfortable, not in distress, pleasant elderly female HEENT: PERRL Respiratory system:  no wheezes or crackles  Cardiovascular system: A-fib with RVR Gastrointestinal system: Abdomen is nondistended, soft and nontender.  Right upper quadrant drain Central nervous system: Alert and oriented Extremities: No edema, no clubbing ,no cyanosis Skin: No rashes, no ulcers,no icterus     Data Reviewed: I have personally reviewed following labs and imaging studies  CBC: Recent Labs  Lab 10/19/22 1123 10/19/22 1129 10/20/22 0225 10/20/22 1143 10/21/22 0207 10/21/22 0640 10/22/22 0207  WBC 21.6*  --  13.7* 12.4* 8.8 11.1* 10.4  NEUTROABS 18.3*  --   --   --   --   --   --   HGB 14.6   < > 11.9* 11.8* 9.7* 11.5* 11.0*  HCT 45.9   < > 36.9 36.8 31.8* 36.5 35.1*  MCV 89.8  --  90.7 92.7 95.2 90.8 91.2  PLT 259  --  226 200 156 192 201   < > = values in this interval not displayed.   Basic Metabolic Panel: Recent Labs  Lab 10/20/22 0225 10/20/22 1143 10/20/22 2145 10/21/22 0640 10/22/22 0207 10/23/22 0102 10/24/22 0127  NA 134*  --   --  136 131* 136 138  K 3.0*  --   --  3.3* 3.2* 3.3* 3.6  CL 99  --   --  101 96* 105 108   CO2 22  --   --  23 23 22 23   GLUCOSE 139*  --   --  113* 443* 108* 110*  BUN 19  --   --  10 7* 10 9  CREATININE 1.15*  --   --  0.97 1.03* 0.93 0.84  CALCIUM 7.9*  --   --  8.2* 7.3* 8.3* 8.3*  MG  --  1.8 1.8  --   --   --   --      Recent Results (from the past 240 hour(s))  Blood culture (routine x 2)     Status: None   Collection Time: 10/19/22  1:01 PM   Specimen: BLOOD  Result Value Ref Range Status   Specimen Description BLOOD SITE NOT SPECIFIED  Final   Special Requests   Final  BOTTLES DRAWN AEROBIC AND ANAEROBIC Blood Culture adequate volume   Culture   Final    NO GROWTH 5 DAYS Performed at [facility], [address], [place], [place] [address]    Report Status 10/24/2022 FINAL  Final  Blood culture (routine x 2)     Status: None   Collection Time: 10/19/22  1:06 PM   Specimen: BLOOD LEFT ARM  Result Value Ref Range Status   Specimen Description BLOOD LEFT ARM  Final   Special Requests   Final    BOTTLES DRAWN AEROBIC AND ANAEROBIC Blood Culture adequate volume   Culture   Final    NO GROWTH 5 DAYS Performed at [facility], [address], [place], [place] [address]    Report Status 10/24/2022 FINAL  Final  C Difficile Quick Screen w PCR reflex     Status: None   Collection Time: 10/21/22  7:10 AM   Specimen: STOOL  Result Value Ref Range Status   C Diff antigen NEGATIVE NEGATIVE Final   C Diff toxin NEGATIVE NEGATIVE Final   C Diff interpretation No C. difficile detected.  Final    Comment: Performed at [facility], [address] [place], [place] [address]  Aerobic/Anaerobic Culture w Gram Stain (surgical/deep wound)     Status: None (Preliminary result)   Collection Time: 10/21/22  4:10 PM   Specimen: BILE  Result Value Ref Range Status   Specimen Description BILE  Final   Special Requests FROM GALLBLADDER DRAIN  Final   Gram Stain   Final    ABUNDANT WBC PRESENT, PREDOMINANTLY PMN FEW GRAM POSITIVE COCCI IN CHAINS FEW GRAM  NEGATIVE RODS Performed at [facility], [address] [place], [place] [address]    Culture   Final    FEW GRAM NEGATIVE RODS CULTURE REINCUBATED FOR BETTER GROWTH NO ANAEROBES ISOLATED; CULTURE IN PROGRESS FOR 5 DAYS    Report Status PENDING  Incomplete     Radiology Studies: DG CHEST PORT 1 VIEW  Result Date: 10/24/2022 CLINICAL DATA:  Diarrhea. EXAM: PORTABLE CHEST 1 VIEW COMPARISON:  03/29/2022. FINDINGS: Cardiac silhouette is normal in size. No mediastinal masses. Prominent left pulmonary artery and right superior pulmonary vein, stable from prior exams. No hilar masses. Left greater than right lung base opacities suspected to be atelectasis, similar to the exam from 03/29/2022. Remainder of the lungs is clear. No convincing pleural effusion or pneumothorax. Right sided PICC has its tip in the mid superior vena cava. IMPRESSION: 1. Lung base opacities, left greater than right, suspected to be atelectasis/scarring. Consider infection if there are consistent clinical findings. 2. Right PICC, line tip in the mid superior vena cava. Electronically Signed   By: Amie Portland M.D.   On: 10/24/2022 09:32   [place] EKG SITE RITE  Result Date: 10/24/2022 If Site Rite image not attached, placement could not be confirmed due to current cardiac rhythm.   Scheduled Meds:  amoxicillin-clavulanate  1 tablet Oral Q12H   apixaban  5 mg Oral BID   Chlorhexidine Gluconate Cloth  6 each Topical Daily   mometasone-formoterol  2 puff Inhalation BID   sodium chloride flush  10-40 mL Intracatheter Q12H   sodium chloride flush  3 mL Intravenous Q12H   sodium chloride flush  5 mL Intracatheter Q8H   Continuous Infusions:  amiodarone 30 mg/hr (10/24/22 0952)     LOS: 5 days   Burnadette Pop, MD [facility] P6/06/2022, 11:07 AM

## 2022-10-24 NOTE — Progress Notes (Signed)
Peripherally Inserted Central Catheter Placement  The IV Nurse has discussed with the patient and/or persons authorized to consent for the patient, the purpose of this procedure and the potential benefits and risks involved with this procedure.  The benefits include less needle sticks, lab draws from the catheter, and the patient may be discharged home with the catheter. Risks include, but not limited to, infection, bleeding, blood clot (thrombus formation), and puncture of an artery; nerve damage and irregular heartbeat and possibility to perform a PICC exchange if needed/ordered by physician.  Alternatives to this procedure were also discussed.  Bard Power PICC patient education guide, fact sheet on infection prevention and patient information card has been provided to patient /or left at bedside.    PICC Placement Documentation  PICC Double Lumen 10/24/22 Right Basilic 41 cm 0 cm (Active)  Indication for Insertion or Continuance of Line Limited venous access - need for IV therapy >5 days (PICC only);Chronic illness with exacerbations (CF, Sickle Cell, etc.) 10/24/22 0904  Exposed Catheter (cm) 0 cm 10/24/22 0904  Site Assessment Clean, Dry, Intact 10/24/22 0904  Lumen #1 Status Saline locked;Blood return noted 10/24/22 0904  Lumen #2 Status Saline locked;Blood return noted 10/24/22 0904  Dressing Type Transparent;Securing device 10/24/22 0904  Dressing Status Antimicrobial disc in place;Clean, Dry, Intact 10/24/22 0904  Safety Lock Not Applicable 10/24/22 0904  Line Care Connections checked and tightened 10/24/22 0904  Line Adjustment (NICU/IV Team Only) No 10/24/22 0904  Dressing Intervention New dressing 10/24/22 0904  Dressing Change Due 10/31/22 10/24/22 0904       Burnard Bunting Chenice 10/24/2022, 9:05 AM

## 2022-10-24 NOTE — Progress Notes (Signed)
Mobility Specialist Progress Note   10/24/22 1439  Mobility  Activity Ambulated with assistance in room  Level of Assistance Contact guard assist, steadying assist  Assistive Device Front wheel walker  Distance Ambulated (ft) 20 ft  Range of Motion/Exercises Active;All extremities  Activity Response Tolerated well   Pre Ambulation:  HR 112 +/- During Ambulation: HR 141-155 Post Ambulation: HR 123 +/-  Patient received in recliner and agreeable to participate with encouragement. Stood with minimal HHA and ambulated short distance in room with min guard. Limited secondary to fatigue. HR elevated peaking at 155 bpm during ambulation. Tolerated without incident. Was left in recliner with all needs met, call bell in reach.   [place] Kerrianne Jeng, BS EXP Mobility Specialist Please contact via SecureChat or Rehab office at [phone]

## 2022-10-24 NOTE — Progress Notes (Signed)
Rounding Note    Patient Name: Laura Mendez Date of Encounter: 10/24/2022  [facility] Cardiologist: Donato Schultz, MD   Subjective   Still with A-fib RVR.  During sleep heart rates are surrounding 100.  With activity up into the 140s to 150s.  Inpatient Medications    Scheduled Meds:  apixaban  5 mg Oral BID   Chlorhexidine Gluconate Cloth  6 each Topical Daily   mometasone-formoterol  2 puff Inhalation BID   sodium chloride flush  10-40 mL Intracatheter Q12H   sodium chloride flush  3 mL Intravenous Q12H   sodium chloride flush  5 mL Intracatheter Q8H   Continuous Infusions:  amiodarone 30 mg/hr (10/24/22 0952)   piperacillin-tazobactam (ZOSYN)  IV 12.5 mL/hr at 10/24/22 0952   PRN Meds: acetaminophen **OR** acetaminophen, albuterol, hydrALAZINE, loperamide, ondansetron **OR** ondansetron (ZOFRAN) IV, sodium chloride flush   Vital Signs    Vitals:   10/23/22 2346 10/24/22 0346 10/24/22 0357 10/24/22 0737  BP: 102/63 (!) 93/50 (!) 91/58 112/67  Pulse:    (!) 111  Resp: 20 20 20 20   Temp: 98 F (36.7 C) 98 F (36.7 C) 98 F (36.7 C) 97.8 F (36.6 C)  TempSrc: Oral Oral Oral Oral  SpO2: 95% 95% 95% 96%  Weight:      Height:        Intake/Output Summary (Last 24 hours) at 10/24/2022 1009 Last data filed at 10/24/2022 0736 Gross per 24 hour  Intake 667.42 ml  Output 1230 ml  Net -562.58 ml      10/19/2022    4:00 PM 10/05/2022   10:53 AM 07/29/2022    9:34 AM  Last 3 Weights  Weight (lbs) 198 lb 6.6 oz 212 lb 203 lb  Weight (kg) 90 kg 96.163 kg 92.08 kg      Telemetry    A-fib RVR- Personally Reviewed  ECG    No known- Personally Reviewed  Physical Exam   GEN: No acute distress.   Neck: No JVD Cardiac: Irregularly irregular tachycardic, no murmurs, rubs, or gallops.  Respiratory: Clear to auscultation bilaterally. GI: Soft, nontender, non-distended  MS: No edema; No deformity. Neuro:  Nonfocal  Psych: Normal affect   Labs     High Sensitivity Troponin:  No results for input(s): "TROPONINIHS" in the last 720 hours.   Chemistry Recent Labs  Lab 10/19/22 1123 10/19/22 1129 10/20/22 1143 10/20/22 2145 10/21/22 0640 10/22/22 0207 10/23/22 0102 10/24/22 0127  NA 131*   < >  --   --    < > 131* 136 138  K 3.3*   < >  --   --    < > 3.2* 3.3* 3.6  CL 97*   < >  --   --    < > 96* 105 108  CO2 18*   < >  --   --    < > 23 22 23   GLUCOSE 152*   < >  --   --    < > 443* 108* 110*  BUN 23   < >  --   --    < > 7* 10 9  CREATININE 1.64*   < >  --   --    < > 1.03* 0.93 0.84  CALCIUM 8.8*   < >  --   --    < > 7.3* 8.3* 8.3*  MG  --   --  1.8 1.8  --   --   --   --  PROT 7.0  --   --   --   --  4.8*  --   --   ALBUMIN 3.1*  --   --   --   --  1.9*  --   --   AST 36  --   --   --   --  31  --   --   ALT 20  --   --   --   --  21  --   --   ALKPHOS 77  --   --   --   --  44  --   --   BILITOT 1.2  --   --   --   --  0.3  --   --   GFRNONAA 31*   < >  --   --    < > 54* >60 >60  ANIONGAP 16*   < >  --   --    < > 12 9 7    < > = values in this interval not displayed.    Lipids No results for input(s): "CHOL", "TRIG", "HDL", "LABVLDL", "LDLCALC", "CHOLHDL" in the last 168 hours.  Hematology Recent Labs  Lab 10/21/22 0207 10/21/22 0640 10/22/22 0207  WBC 8.8 11.1* 10.4  RBC 3.34* 4.02 3.85*  HGB 9.7* 11.5* 11.0*  HCT 31.8* 36.5 35.1*  MCV 95.2 90.8 91.2  MCH 29.0 28.6 28.6  MCHC 30.5 31.5 31.3  RDW 14.4 14.3 14.4  PLT 156 192 201   Thyroid No results for input(s): "TSH", "FREET4" in the last 168 hours.  BNPNo results for input(s): "BNP", "PROBNP" in the last 168 hours.  DDimer No results for input(s): "DDIMER" in the last 168 hours.   Radiology    DG CHEST PORT 1 VIEW  Result Date: 10/24/2022 CLINICAL DATA:  Diarrhea. EXAM: PORTABLE CHEST 1 VIEW COMPARISON:  03/29/2022. FINDINGS: Cardiac silhouette is normal in size. No mediastinal masses. Prominent left pulmonary artery and right superior pulmonary  vein, stable from prior exams. No hilar masses. Left greater than right lung base opacities suspected to be atelectasis, similar to the exam from 03/29/2022. Remainder of the lungs is clear. No convincing pleural effusion or pneumothorax. Right sided PICC has its tip in the mid superior vena cava. IMPRESSION: 1. Lung base opacities, left greater than right, suspected to be atelectasis/scarring. Consider infection if there are consistent clinical findings. 2. Right PICC, line tip in the mid superior vena cava. Electronically Signed   By: Amie Portland M.D.   On: 10/24/2022 09:32   [place] EKG SITE RITE  Result Date: 10/24/2022 If Site Rite image not attached, placement could not be confirmed due to current cardiac rhythm.   Cardiac Studies   Cardiac catheterization 2023-no flow-limiting CAD Echo-normal EF dilated atrium  Patient Profile     82 y.o. female paroxysmal atrial fibrillation with cholecystitis, percutaneous tube drainage 5/30  Assessment & Plan    Paroxysmal atrial fibrillation surrounding cholecystitis - Continue with IV amiodarone.  Transition to oral amiodarone closer to discharge.  Likely exacerbated by cholecystitis.  She has had previous episodes of atrial fibrillation. -Difficult to control heart rate.  Cardizem previously stopped because of rash.   Rash did not seem to be related to drugs.  Chronic anticoagulation - On Eliquis.  Continue to work with PT.  Debility noted.    For questions or updates, please contact [facility] Please consult [email or web address] for contact info under  Signed, Donato Schultz, MD  10/24/2022, 10:09 AM

## 2022-10-25 ENCOUNTER — Inpatient Hospital Stay (HOSPITAL_COMMUNITY): Payer: Medicare Other

## 2022-10-25 DIAGNOSIS — K810 Acute cholecystitis: Secondary | ICD-10-CM | POA: Diagnosis not present

## 2022-10-25 DIAGNOSIS — I480 Paroxysmal atrial fibrillation: Secondary | ICD-10-CM | POA: Diagnosis not present

## 2022-10-25 DIAGNOSIS — I4891 Unspecified atrial fibrillation: Secondary | ICD-10-CM

## 2022-10-25 LAB — ECHOCARDIOGRAM LIMITED
Height: 71 in
Weight: 3174.62 oz

## 2022-10-25 MED ORDER — DIGOXIN 125 MCG PO TABS
0.1250 mg | ORAL_TABLET | Freq: Every day | ORAL | Status: DC
  Administered 2022-10-25: 0.125 mg via ORAL
  Filled 2022-10-25: qty 1

## 2022-10-25 NOTE — Progress Notes (Signed)
BM noted with appearance of blood in stool-scant amount (turned paper and fluid in toilet pink).  Pt denies hx of hemorrhoids, ulcers or every seeing that in her stool before.

## 2022-10-25 NOTE — Progress Notes (Signed)
Cardiology Progress Note  Patient ID: Laura Mendez MRN: [id number] DOB: 01-Aug-1940 Date of Encounter: 10/25/2022  Primary Cardiologist: Donato Schultz, MD  Subjective   Chief Complaint: Weakness  HPI: A-fib heart rates still poorly controlled.  ROS:  All other ROS reviewed and negative. Pertinent positives noted in the HPI.     Inpatient Medications  Scheduled Meds:  amoxicillin-clavulanate  1 tablet Oral Q12H   apixaban  5 mg Oral BID   Chlorhexidine Gluconate Cloth  6 each Topical Daily   digoxin  0.125 mg Oral Daily   mometasone-formoterol  2 puff Inhalation BID   sodium chloride flush  10-40 mL Intracatheter Q12H   sodium chloride flush  3 mL Intravenous Q12H   sodium chloride flush  5 mL Intracatheter Q8H   Continuous Infusions:  amiodarone 30 mg/hr (10/25/22 0239)   PRN Meds: acetaminophen **OR** acetaminophen, albuterol, hydrALAZINE, loperamide, ondansetron **OR** ondansetron (ZOFRAN) IV, sodium chloride flush   Vital Signs   Vitals:   10/25/22 0044 10/25/22 0425 10/25/22 0731 10/25/22 0752  BP: 96/67 95/63 108/67   Pulse: 67 98 74   Resp: 20 20 (!) 25   Temp: 98.5 F (36.9 C) 98.5 F (36.9 C) 97.7 F (36.5 C)   TempSrc: Oral Oral Oral   SpO2: 95% 93% 95% 94%  Weight:      Height:        Intake/Output Summary (Last 24 hours) at 10/25/2022 0852 Last data filed at 10/24/2022 1800 Gross per 24 hour  Intake 645.84 ml  Output 100 ml  Net 545.84 ml      10/19/2022    4:00 PM 10/05/2022   10:53 AM 07/29/2022    9:34 AM  Last 3 Weights  Weight (lbs) 198 lb 6.6 oz 212 lb 203 lb  Weight (kg) 90 kg 96.163 kg 92.08 kg      Telemetry  Overnight telemetry shows Afib 110-140s, which I personally reviewed.   Physical Exam   Vitals:   10/25/22 0044 10/25/22 0425 10/25/22 0731 10/25/22 0752  BP: 96/67 95/63 108/67   Pulse: 67 98 74   Resp: 20 20 (!) 25   Temp: 98.5 F (36.9 C) 98.5 F (36.9 C) 97.7 F (36.5 C)   TempSrc: Oral Oral Oral   SpO2: 95%  93% 95% 94%  Weight:      Height:        Intake/Output Summary (Last 24 hours) at 10/25/2022 0852 Last data filed at 10/24/2022 1800 Gross per 24 hour  Intake 645.84 ml  Output 100 ml  Net 545.84 ml       10/19/2022    4:00 PM 10/05/2022   10:53 AM 07/29/2022    9:34 AM  Last 3 Weights  Weight (lbs) 198 lb 6.6 oz 212 lb 203 lb  Weight (kg) 90 kg 96.163 kg 92.08 kg    Body mass index is 27.67 kg/m.  General: Well nourished, well developed, in no acute distress Head: Atraumatic, normal size  Eyes: PEERLA, EOMI  Neck: Supple, no JVD Endocrine: No thryomegaly Cardiac: Normal S1, S2; irregular rhythm, no murmurs rubs or gallops Lungs: Clear to auscultation bilaterally, no wheezing, rhonchi or rales  Abd: Soft, nontender, no hepatomegaly  Ext: No edema, pulses 2+ Musculoskeletal: No deformities, BUE and BLE strength normal and equal Skin: Warm and dry, no rashes   Neuro: Alert and oriented to person, place, time, and situation, CNII-XII grossly intact, no focal deficits  Psych: Normal mood and affect   Labs  High  Sensitivity Troponin:  No results for input(s): "TROPONINIHS" in the last 720 hours.   Cardiac EnzymesNo results for input(s): "TROPONINI" in the last 168 hours. No results for input(s): "TROPIPOC" in the last 168 hours.  Chemistry Recent Labs  Lab 10/19/22 1123 10/19/22 1129 10/22/22 0207 10/23/22 0102 10/24/22 0127  NA 131*   < > 131* 136 138  K 3.3*   < > 3.2* 3.3* 3.6  CL 97*   < > 96* 105 108  CO2 18*   < > 23 22 23   GLUCOSE 152*   < > 443* 108* 110*  BUN 23   < > 7* 10 9  CREATININE 1.64*   < > 1.03* 0.93 0.84  CALCIUM 8.8*   < > 7.3* 8.3* 8.3*  PROT 7.0  --  4.8*  --   --   ALBUMIN 3.1*  --  1.9*  --   --   AST 36  --  31  --   --   ALT 20  --  21  --   --   ALKPHOS 77  --  44  --   --   BILITOT 1.2  --  0.3  --   --   GFRNONAA 31*   < > 54* >60 >60  ANIONGAP 16*   < > 12 9 7    < > = values in this interval not displayed.    Hematology Recent Labs   Lab 10/21/22 0207 10/21/22 0640 10/22/22 0207  WBC 8.8 11.1* 10.4  RBC 3.34* 4.02 3.85*  HGB 9.7* 11.5* 11.0*  HCT 31.8* 36.5 35.1*  MCV 95.2 90.8 91.2  MCH 29.0 28.6 28.6  MCHC 30.5 31.5 31.3  RDW 14.4 14.3 14.4  PLT 156 192 201   BNPNo results for input(s): "BNP", "PROBNP" in the last 168 hours.  DDimer No results for input(s): "DDIMER" in the last 168 hours.   Radiology  DG CHEST PORT 1 VIEW  Result Date: 10/24/2022 CLINICAL DATA:  Diarrhea. EXAM: PORTABLE CHEST 1 VIEW COMPARISON:  03/29/2022. FINDINGS: Cardiac silhouette is normal in size. No mediastinal masses. Prominent left pulmonary artery and right superior pulmonary vein, stable from prior exams. No hilar masses. Left greater than right lung base opacities suspected to be atelectasis, similar to the exam from 03/29/2022. Remainder of the lungs is clear. No convincing pleural effusion or pneumothorax. Right sided PICC has its tip in the mid superior vena cava. IMPRESSION: 1. Lung base opacities, left greater than right, suspected to be atelectasis/scarring. Consider infection if there are consistent clinical findings. 2. Right PICC, line tip in the mid superior vena cava. Electronically Signed   By: Amie Portland M.D.   On: 10/24/2022 09:32   [place] EKG SITE RITE  Result Date: 10/24/2022 If Site Rite image not attached, placement could not be confirmed due to current cardiac rhythm.   Cardiac Studies  LHC 11/23/2021 1) Patent coronary arteries, right dominant, with mild irregularity but no significant stenoses. 2) Normal LVEDP   TTE 11/23/2021  1. Left ventricular ejection fraction, by estimation, is 60 to 65%. The  left ventricle has normal function. The left ventricle has no regional  wall motion abnormalities. There is mild left ventricular hypertrophy.  Left ventricular diastolic parameters  are consistent with Grade I diastolic dysfunction (impaired relaxation).  Elevated left atrial pressure.   2. Right ventricular  systolic function is normal. The right ventricular  size is normal. There is mildly elevated pulmonary artery systolic  pressure.  3. Left atrial size was severely dilated.   4. The mitral valve is normal in structure. Trivial mitral valve  regurgitation. No evidence of mitral stenosis. Severe mitral annular  calcification.   5. The aortic valve is tricuspid. Aortic valve regurgitation is not  visualized. Aortic valve sclerosis is present, with no evidence of aortic  valve stenosis.   6. The inferior vena cava is dilated in size with >50% respiratory  variability, suggesting right atrial pressure of 8 mmHg.    Patient Profile  Lesandra Bonello is a 82 y.o. female with paroxysmal atrial fibrillation, stroke, HFpEF, breast cancer who was admitted on 10/19/2022 for acute cholecystitis status post percutaneous cholecystostomy tube placement.  Course complicated by A-fib with RVR.  Assessment & Plan   # A-fib with RVR -Likely driven by acute cholecystitis.  Rates are still poorly controlled.  Add digoxin 0.125 mg daily.  BPs have been soft.  Continue IV amiodarone.  We will likely plan for either oral amiodarone oral beta-blocker as long as blood pressure improves with digoxin. -Continue Eliquis. -Ideally we would avoid cardioversion.  She is having very difficult to control rates.  We will see what we get with digoxin. -Given soft blood pressure we will repeat a limited echo today as well.  I would like to evaluate her LV and RV function.      For questions or updates, please contact [facility] Please consult [email or web address] for contact info under        Signed, Gerri Spore T. Flora Lipps, MD, [facility]  [facility]  10/25/2022 8:52 AM

## 2022-10-25 NOTE — Progress Notes (Signed)
PROGRESS NOTE  Laura Mendez  WUJ:[id number] DOB: 1940/12/04 DOA: 10/19/2022 PCP: [name], MD   Brief Narrative: Patient is a 82 year old female with history of breast cancer, chronic diastolic CHF, A-fib, CVA who presented with complaint of diarrhea, nausea, vomiting ,abdominal pain.  On presentation, she was in A-fib with RVR with heart rate in the range of 150.  CT abdomen/pelvis was consistent of cholecystitis, general surgery consulted.  Started on amiodarone drip.  Cardiology following. S/p percutaneous cholecystostomy on 5/30 .  Remains in A-fib with RVR.Started on digoxin  Assessment & Plan:  Principal Problem:   Acute cholecystitis Active Problems:   Asthma with COPD (HCC)   Paroxysmal atrial fibrillation (HCC)   Malignant neoplasm of lower-outer quadrant of right breast of female, estrogen receptor positive (HCC)   CVA (cerebral vascular accident) (HCC)   Atopic dermatitis   AKI (acute kidney injury) (HCC)   Pancreatic lesion   Acute cholecystitis: Presented with nausea, vomiting, abdominal pain.  Lab work showed leukocytosis with WBC count of 21,000, AKI with creatinine of 1.6, normal liver enzymes. CT abdomen/pelvis showed abnormal gallbladder with dilatation and stones,significant wall thickening, edema and adjacent stranding consistent with  acute cholecystitis. Possible stone in the neck of the gallbladder . General surgery consulted.  Started on zosyn. Follow-up  blood cultures:NGTD  Leukocytosis resolved.  Lactic acidosis has resolved. IR did cholecystostomy drain placement.  General surgery signed off and follow-up as an outpatient.  Aerobic/anaerobic cultures showing few gram-positive cocci, gram-negative rods, will follow-up.  Antibiotics changed to Augmentin  A-fib with RVR: Takes Eliquis at home.  Heart rate in the range of 150 on presentation.  Started on amiodarone drip.  Cardiology consulted and following.  Eliquis resumed.  We will convert  amiodarone to oral when appropriate.  Remains in RVR.  Cardiology started on digoxin  AKI: Presented with creatinine in the range of 1.6.  Kidney function  normalized with IV fluid.  Hypokalemia: Currently being  monitored and supplemented as needed  History of CVA: History of mild to moderate dementia.  Continue delirium precautions  Asthma/COPD: Continue bronchodilators as needed  Atopic dermatitis: On Dupixent at home  History of breast cancer: S/p lumpectomy.  On letrozole at home.   Pancreatic lesion: 2.7 cm cystic lesion seen along the tail of the pancreas. Recommended follow up imaging in 6 months.  Generalized weakness: PT/OT consulted, recommended home health      DVT prophylaxis: Eliquis apixaban (ELIQUIS) tablet 5 mg     Code Status: Full Code  Family Communication: Discussed with son on phone on 6/2  Patient status:Inpatient  Patient is from :home  Anticipated discharge NF:AOZH  Estimated DC date:1-2 days, after improvement in the heart rate   Consultants: Surgery,[email or web address]  Procedures: Right upper quadrant drain placement  Antimicrobials:  Anti-infectives (From admission, onward)    Start     Dose/Rate Route Frequency Ordered Stop   10/24/22 1200  amoxicillin-clavulanate (AUGMENTIN) 875-125 MG per tablet 1 tablet        1 tablet Oral Every 12 hours 10/24/22 1107     10/19/22 1900  piperacillin-tazobactam (ZOSYN) IVPB 3.375 g  Status:  Discontinued        3.375 g 12.5 mL/hr over 240 Minutes Intravenous Every 8 hours 10/19/22 1459 10/24/22 1107   10/19/22 1445  piperacillin-tazobactam (ZOSYN) IVPB 3.375 g  Status:  Discontinued        3.375 g 100 mL/hr over 30 Minutes Intravenous Every 8 hours 10/19/22 1444 10/19/22 1459  10/19/22 1300  piperacillin-tazobactam (ZOSYN) IVPB 3.375 g        3.375 g 100 mL/hr over 30 Minutes Intravenous  Once 10/19/22 1252 10/19/22 1348       Subjective: Patient seen and examined by me today.  She is very  comfortable, sitting in the chair.  Denies any abdomen pain, nausea or vomiting.  Heart rate is still in the range of 120s  Objective: Vitals:   10/25/22 0044 10/25/22 0425 10/25/22 0731 10/25/22 0752  BP: 96/67 95/63 108/67   Pulse: 67 98 74   Resp: 20 20 (!) 25   Temp: 98.5 F (36.9 C) 98.5 F (36.9 C) 97.7 F (36.5 C)   TempSrc: Oral Oral Oral   SpO2: 95% 93% 95% 94%  Weight:      Height:        Intake/Output Summary (Last 24 hours) at 10/25/2022 1133 Last data filed at 10/24/2022 1800 Gross per 24 hour  Intake 645.84 ml  Output 100 ml  Net 545.84 ml   Filed Weights   10/19/22 1600  Weight: 90 kg    Examination:  General exam: Overall comfortable, not in distress HEENT: PERRL Respiratory system:  no wheezes or crackles  Cardiovascular system: afib with rvr Gastrointestinal system: Abdomen is nondistended, soft and nontender.  Right upper quadrant drain Central nervous system: Alert and oriented Extremities: No edema, no clubbing ,no cyanosis Skin: No rashes, no ulcers,no icterus     Data Reviewed: I have personally reviewed following labs and imaging studies  CBC: Recent Labs  Lab 10/19/22 1123 10/19/22 1129 10/20/22 0225 10/20/22 1143 10/21/22 0207 10/21/22 0640 10/22/22 0207  WBC 21.6*  --  13.7* 12.4* 8.8 11.1* 10.4  NEUTROABS 18.3*  --   --   --   --   --   --   HGB 14.6   < > 11.9* 11.8* 9.7* 11.5* 11.0*  HCT 45.9   < > 36.9 36.8 31.8* 36.5 35.1*  MCV 89.8  --  90.7 92.7 95.2 90.8 91.2  PLT 259  --  226 200 156 192 201   < > = values in this interval not displayed.   Basic Metabolic Panel: Recent Labs  Lab 10/20/22 0225 10/20/22 1143 10/20/22 2145 10/21/22 0640 10/22/22 0207 10/23/22 0102 10/24/22 0127  NA 134*  --   --  136 131* 136 138  K 3.0*  --   --  3.3* 3.2* 3.3* 3.6  CL 99  --   --  101 96* 105 108  CO2 22  --   --  23 23 22 23   GLUCOSE 139*  --   --  113* 443* 108* 110*  BUN 19  --   --  10 7* 10 9  CREATININE 1.15*  --   --   0.97 1.03* 0.93 0.84  CALCIUM 7.9*  --   --  8.2* 7.3* 8.3* 8.3*  MG  --  1.8 1.8  --   --   --   --      Recent Results (from the past 240 hour(s))  Blood culture (routine x 2)     Status: None   Collection Time: 10/19/22  1:01 PM   Specimen: BLOOD  Result Value Ref Range Status   Specimen Description BLOOD SITE NOT SPECIFIED  Final   Special Requests   Final    BOTTLES DRAWN AEROBIC AND ANAEROBIC Blood Culture adequate volume   Culture   Final    NO GROWTH 5 DAYS  Performed at [facility], [address], [place], [place] [address]    Report Status 10/24/2022 FINAL  Final  Blood culture (routine x 2)     Status: None   Collection Time: 10/19/22  1:06 PM   Specimen: BLOOD LEFT ARM  Result Value Ref Range Status   Specimen Description BLOOD LEFT ARM  Final   Special Requests   Final    BOTTLES DRAWN AEROBIC AND ANAEROBIC Blood Culture adequate volume   Culture   Final    NO GROWTH 5 DAYS Performed at [facility], [address], [place], [place] [address]    Report Status 10/24/2022 FINAL  Final  C Difficile Quick Screen w PCR reflex     Status: None   Collection Time: 10/21/22  7:10 AM   Specimen: STOOL  Result Value Ref Range Status   C Diff antigen NEGATIVE NEGATIVE Final   C Diff toxin NEGATIVE NEGATIVE Final   C Diff interpretation No C. difficile detected.  Final    Comment: Performed at [facility], [address] [place], [place] [address]  Aerobic/Anaerobic Culture w Gram Stain (surgical/deep wound)     Status: None (Preliminary result)   Collection Time: 10/21/22  4:10 PM   Specimen: BILE  Result Value Ref Range Status   Specimen Description BILE  Final   Special Requests FROM GALLBLADDER DRAIN  Final   Gram Stain   Final    ABUNDANT WBC PRESENT, PREDOMINANTLY PMN FEW GRAM POSITIVE COCCI IN CHAINS FEW GRAM NEGATIVE RODS Performed at [facility], [address], [place], [place] [address]    Culture   Final    FEW GRAM NEGATIVE  RODS IDENTIFICATION AND SUSCEPTIBILITIES TO FOLLOW NO ANAEROBES ISOLATED; CULTURE IN PROGRESS FOR 5 DAYS    Report Status PENDING  Incomplete     Radiology Studies: DG CHEST PORT 1 VIEW  Result Date: 10/24/2022 CLINICAL DATA:  Diarrhea. EXAM: PORTABLE CHEST 1 VIEW COMPARISON:  03/29/2022. FINDINGS: Cardiac silhouette is normal in size. No mediastinal masses. Prominent left pulmonary artery and right superior pulmonary vein, stable from prior exams. No hilar masses. Left greater than right lung base opacities suspected to be atelectasis, similar to the exam from 03/29/2022. Remainder of the lungs is clear. No convincing pleural effusion or pneumothorax. Right sided PICC has its tip in the mid superior vena cava. IMPRESSION: 1. Lung base opacities, left greater than right, suspected to be atelectasis/scarring. Consider infection if there are consistent clinical findings. 2. Right PICC, line tip in the mid superior vena cava. Electronically Signed   By: Amie Portland M.D.   On: 10/24/2022 09:32   [place] EKG SITE RITE  Result Date: 10/24/2022 If Site Rite image not attached, placement could not be confirmed due to current cardiac rhythm.   Scheduled Meds:  amoxicillin-clavulanate  1 tablet Oral Q12H   apixaban  5 mg Oral BID   Chlorhexidine Gluconate Cloth  6 each Topical Daily   digoxin  0.125 mg Oral Daily   mometasone-formoterol  2 puff Inhalation BID   sodium chloride flush  10-40 mL Intracatheter Q12H   sodium chloride flush  3 mL Intravenous Q12H   sodium chloride flush  5 mL Intracatheter Q8H   Continuous Infusions:  amiodarone 30 mg/hr (10/25/22 0239)     LOS: 6 days   Burnadette Pop, MD [facility] P6/07/2022, 11:33 AM

## 2022-10-25 NOTE — Progress Notes (Signed)
Physical Therapy Treatment Patient Details Name: Laura Mendez MRN: [id number] DOB: [birth year] Today's Date: 10/25/2022   History of Present Illness 82 y.o. female with medical history significant of breast CA, chronic diastolic CHF, afib, and CVA presenting with 3-4 days of diarrhea. CT with acute cholecystitis.    PT Comments    The pt was agreeable to session, reports continues fatigue this afternoon. The pt continues to have brief HR elevation from 140-167bpm with exertion, but typically HR returns to 100-120s with rest. The pt was able to progress total distance this session, but did have seated rest (used BSC) after first 8 ft. The pt continues to benefit from cues for positioning in RW and encouragement to progress distance. Recommendations remain appropriate.     Recommendations for follow up therapy are one component of a multi-disciplinary discharge planning process, led by the attending physician.  Recommendations may be updated based on patient status, additional functional criteria and insurance authorization.  Follow Up Recommendations       Assistance Recommended at Discharge Intermittent Supervision/Assistance  Patient can return home with the following A little help with walking and/or transfers;A little help with bathing/dressing/bathroom;Assistance with cooking/housework;Direct supervision/assist for medications management;Direct supervision/assist for financial management;Assist for transportation;Help with stairs or ramp for entrance   Equipment Recommendations  None recommended by PT    Recommendations for Other Services       Precautions / Restrictions Precautions Precautions: Fall Precaution Comments: loose stool, watch HR Restrictions Weight Bearing Restrictions: No     Mobility  Bed Mobility Overal bed mobility: Needs Assistance             General bed mobility comments: sitting EOB upon arrival    Transfers Overall transfer level:  Needs assistance Equipment used: Rolling walker (2 wheels) Transfers: Sit to/from Stand Sit to Stand: Min assist, Min guard           General transfer comment: minA to power up from EOB, minG to stand from elevated  [facility]    Ambulation/Gait Ambulation/Gait assistance: Min assist Gait Distance (Feet): 8 Feet (+ 35ft) Assistive device: Rolling walker (2 wheels) Gait Pattern/deviations: Step-through pattern, Decreased step length - right, Decreased step length - left, Trunk flexed Gait velocity: slowed Gait velocity interpretation: <1.31 ft/sec, indicative of household ambulator   General Gait Details: HR to 140s from EOB to BSC, to 167bpm with walking in room. No overt LOB but limited due to fatigue. HR quickly returns to 100-120s with seated rest     Balance Overall balance assessment: Needs assistance Sitting-balance support: Feet supported Sitting balance-Leahy Scale: Fair     Standing balance support: Bilateral upper extremity supported, During functional activity, Reliant on assistive device for balance Standing balance-Leahy Scale: Poor Standing balance comment: dependent on BUE support for gait                            Cognition Arousal/Alertness: Awake/alert Behavior During Therapy: WFL for tasks assessed/performed Overall Cognitive Status: Impaired/Different from baseline Area of Impairment: Memory                     Memory: Decreased short-term memory         General Comments: pt tangential and self-distracted at times but is redirectable.        Exercises      General Comments General comments (skin integrity, edema, etc.): HR to 167bpm with exertion. returns to 100-120bpm wiht rest.  Pertinent Vitals/Pain Pain Assessment Pain Assessment: No/denies pain     PT Goals (current goals can now be found in the care plan section) Acute Rehab PT Goals Patient Stated Goal: get back to doing things at home PT Goal Formulation:  With patient Time For Goal Achievement: 11/05/22 Potential to Achieve Goals: Good Progress towards PT goals: Progressing toward goals    Frequency    Min 1X/week      PT Plan Current plan remains appropriate       AM-PAC PT "6 Clicks" Mobility   Outcome Measure  Help needed turning from your back to your side while in a flat bed without using bedrails?: None Help needed moving from lying on your back to sitting on the side of a flat bed without using bedrails?: A Little Help needed moving to and from a bed to a chair (including a wheelchair)?: A Little Help needed standing up from a chair using your arms (e.g., wheelchair or bedside chair)?: A Little Help needed to walk in hospital room?: A Little Help needed climbing 3-5 steps with a railing? : A Lot 6 Click Score: 18    End of Session Equipment Utilized During Treatment: Oxygen Activity Tolerance: Patient tolerated treatment well;Treatment limited secondary to medical complications (Comment) (HR in 160s) Patient left: in chair;with call bell/phone within reach;with chair alarm set Nurse Communication: Mobility status;Other (comment) (sacral redness) PT Visit Diagnosis: Other abnormalities of gait and mobility (R26.89);Muscle weakness (generalized) (M62.81);Difficulty in walking, not elsewhere classified (R26.2)     Time: 1400-1430 PT Time Calculation (min) (ACUTE ONLY): 30 min  Charges:  $Gait Training: 8-22 mins $Therapeutic Exercise: 8-22 mins                     Vickki Muff, PT, DPT   Acute Rehabilitation Department Office [phone] Secure Chat Communication Preferred   Ronnie Derby 10/25/2022, 2:46 PM

## 2022-10-26 ENCOUNTER — Encounter: Payer: Self-pay | Admitting: Cardiology

## 2022-10-26 DIAGNOSIS — K810 Acute cholecystitis: Secondary | ICD-10-CM | POA: Diagnosis not present

## 2022-10-26 DIAGNOSIS — I480 Paroxysmal atrial fibrillation: Secondary | ICD-10-CM | POA: Diagnosis not present

## 2022-10-26 LAB — GLUCOSE, CAPILLARY: Glucose-Capillary: 110 mg/dL — ABNORMAL HIGH (ref 70–99)

## 2022-10-26 LAB — CBC
HCT: 38.6 % (ref 36.0–46.0)
Hemoglobin: 12.2 g/dL (ref 12.0–15.0)
MCH: 28.4 pg (ref 26.0–34.0)
MCHC: 31.6 g/dL (ref 30.0–36.0)
MCV: 89.8 fL (ref 80.0–100.0)
Platelets: 475 10*3/uL — ABNORMAL HIGH (ref 150–400)
RBC: 4.3 MIL/uL (ref 3.87–5.11)
RDW: 14.9 % (ref 11.5–15.5)
WBC: 12.4 10*3/uL — ABNORMAL HIGH (ref 4.0–10.5)
nRBC: 0 % (ref 0.0–0.2)

## 2022-10-26 LAB — BASIC METABOLIC PANEL
Anion gap: 10 (ref 5–15)
BUN: 13 mg/dL (ref 8–23)
CO2: 24 mmol/L (ref 22–32)
Calcium: 8.8 mg/dL — ABNORMAL LOW (ref 8.9–10.3)
Chloride: 105 mmol/L (ref 98–111)
Creatinine, Ser: 0.93 mg/dL (ref 0.44–1.00)
GFR, Estimated: >60 mL/min (ref >60)
Glucose, Bld: 104 mg/dL — ABNORMAL HIGH (ref 70–99)
Potassium: 3.9 mmol/L (ref 3.5–5.1)
Sodium: 139 mmol/L (ref 135–145)

## 2022-10-26 LAB — AEROBIC/ANAEROBIC CULTURE W GRAM STAIN (SURGICAL/DEEP WOUND)

## 2022-10-26 MED ORDER — METOPROLOL TARTRATE 12.5 MG HALF TABLET
12.5000 mg | ORAL_TABLET | Freq: Two times a day (BID) | ORAL | Status: DC
  Administered 2022-10-26 (×2): 12.5 mg via ORAL
  Filled 2022-10-26 (×2): qty 1

## 2022-10-26 MED ORDER — FUROSEMIDE 40 MG PO TABS
40.0000 mg | ORAL_TABLET | Freq: Once | ORAL | Status: AC
  Administered 2022-10-26: 40 mg via ORAL
  Filled 2022-10-26: qty 1

## 2022-10-26 MED ORDER — CALCIUM CARBONATE ANTACID 500 MG PO CHEW: 1.0000 | CHEWABLE_TABLET | Freq: Three times a day (TID) | ORAL | Status: DC | PRN

## 2022-10-26 MED ORDER — AMIODARONE HCL 200 MG PO TABS
400.0000 mg | ORAL_TABLET | Freq: Two times a day (BID) | ORAL | Status: DC
  Administered 2022-10-26 – 2022-10-27 (×3): 400 mg via ORAL
  Filled 2022-10-26 (×3): qty 2

## 2022-10-26 MED ORDER — SULFAMETHOXAZOLE-TRIMETHOPRIM 800-160 MG PO TABS
1.0000 | ORAL_TABLET | Freq: Two times a day (BID) | ORAL | Status: DC
  Administered 2022-10-26 – 2022-10-27 (×2): 1 via ORAL
  Filled 2022-10-26 (×3): qty 1

## 2022-10-26 MED ORDER — AMIODARONE HCL 200 MG PO TABS: 200.0000 mg | ORAL_TABLET | Freq: Every day | ORAL | Status: DC

## 2022-10-26 NOTE — Progress Notes (Signed)
Patient Teaching:   Teaching performed with nurse and pt including Chole drain bag flushes, bandage changes and emptying.   Patient performed all tasks.  Needs reinforcement

## 2022-10-26 NOTE — Progress Notes (Addendum)
PROGRESS NOTE  Laura Mendez  WUJ:[id number] DOB: [birth year] DOA: 10/19/2022 PCP: [name], MD   Brief Narrative: Patient is a 82 year old female with history of breast cancer, chronic diastolic CHF, A-fib, CVA who presented with complaint of diarrhea, nausea, vomiting ,abdominal pain.  On presentation, she was in A-fib with RVR with heart rate in the range of 150.  CT abdomen/pelvis was consistent of cholecystitis, general surgery consulted.  Started on amiodarone drip.  Cardiology following. S/p percutaneous cholecystostomy on 5/30 . Hospital course remarkable for persistent A-fib with RVR, now converted to normal sinus rhythm.  Plan for discharge home tomorrow if remains stable  Assessment & Plan:  Principal Problem:   Acute cholecystitis Active Problems:   Asthma with COPD (HCC)   Paroxysmal atrial fibrillation (HCC)   Malignant neoplasm of lower-outer quadrant of right breast of female, estrogen receptor positive (HCC)   CVA (cerebral vascular accident) (HCC)   Atopic dermatitis   AKI (acute kidney injury) (HCC)   Pancreatic lesion   Acute cholecystitis: Presented with nausea, vomiting, abdominal pain.  Lab work showed leukocytosis with WBC count of 21,000, AKI with creatinine of 1.6, normal liver enzymes. CT abdomen/pelvis showed abnormal gallbladder with dilatation and stones,significant wall thickening, edema and adjacent stranding consistent with  acute cholecystitis. Possible stone in the neck of the gallbladder . General surgery consulted.  Started on zosyn. Follow-up  blood cultures:NGTD  Leukocytosis resolved.  Lactic acidosis has resolved. IR did cholecystostomy drain placement.  General surgery signed off and follow-up as an outpatient.  Aerobic/anaerobic cultures showing HAFNIA ALVEI ,started on bactrim. She will follow-up with general surgery, IR as an outpatient  A-fib with RVR: Takes Eliquis at home.  Heart rate in the range of 150 on presentation.   Started on amiodarone drip.  Cardiology consulted and following.  Eliquis resumed.  Now normal sinus rhythm. Amio changed to oral.  Started on low-dose metoprolol  AKI: Presented with creatinine in the range of 1.6.  Kidney function  normalized with IV fluid.  Hypokalemia: Currently being  monitored and supplemented as needed  History of CVA: History of mild to moderate dementia.  Continue delirium precautions  Asthma/COPD: Continue bronchodilators as needed  Atopic dermatitis: On Dupixent at home  History of breast cancer: S/p lumpectomy.  On letrozole at home.   Pancreatic lesion: 2.7 cm cystic lesion seen along the tail of the pancreas. Recommended follow up imaging in 6 months.  Generalized weakness: PT/OT consulted, recommended home health      DVT prophylaxis: Eliquis apixaban (ELIQUIS) tablet 5 mg     Code Status: Full Code  Family Communication: Discussed with son on phone on 6/2  Patient status:Inpatient  Patient is from :home  Anticipated discharge NF:AOZH  Estimated DC date:tomorrow   Consultants: Surgery,cardiology.IR  Procedures: Right upper quadrant drain placement  Antimicrobials:  Anti-infectives (From admission, onward)    Start     Dose/Rate Route Frequency Ordered Stop   10/26/22 1115  sulfamethoxazole-trimethoprim (BACTRIM DS) 800-160 MG per tablet 1 tablet        1 tablet Oral Every 12 hours 10/26/22 1025     10/24/22 1200  amoxicillin-clavulanate (AUGMENTIN) 875-125 MG per tablet 1 tablet  Status:  Discontinued        1 tablet Oral Every 12 hours 10/24/22 1107 10/26/22 1025   10/19/22 1900  piperacillin-tazobactam (ZOSYN) IVPB 3.375 g  Status:  Discontinued        3.375 g 12.5 mL/hr over 240 Minutes Intravenous Every 8  hours 10/19/22 1459 10/24/22 1107   10/19/22 1445  piperacillin-tazobactam (ZOSYN) IVPB 3.375 g  Status:  Discontinued        3.375 g 100 mL/hr over 30 Minutes Intravenous Every 8 hours 10/19/22 1444 10/19/22 1459   10/19/22  1300  piperacillin-tazobactam (ZOSYN) IVPB 3.375 g        3.375 g 100 mL/hr over 30 Minutes Intravenous  Once 10/19/22 1252 10/19/22 1348       Subjective: Patient seen and examined at bedside today.  Hemodynamically stable comfortable.  Heart is well-controlled and in normal sinus rhythm.  Denies any complaints.  We discussed about discharge plan tomorrow morning  Objective: Vitals:   10/26/22 0319 10/26/22 0747 10/26/22 0852 10/26/22 1223  BP: 107/69 (!) 127/55  116/82  Pulse: 100 69    Resp: 20 16  (!) 21  Temp: 98.3 F (36.8 C) 97.6 F (36.4 C)  98 F (36.7 C)  TempSrc: Oral Axillary  Oral  SpO2: 96% 97% 96%   Weight:      Height:        Intake/Output Summary (Last 24 hours) at 10/26/2022 1224 Last data filed at 10/26/2022 0754 Gross per 24 hour  Intake --  Output 850 ml  Net -850 ml   Filed Weights   10/19/22 1600  Weight: 90 kg    Examination:  General exam: Overall comfortable, not in distress HEENT: PERRL Respiratory system:  no wheezes or crackles  Cardiovascular system: S1 & S2 heard, RRR.  Gastrointestinal system: Abdomen is nondistended, soft and nontender.RUQ drain Central nervous system: Alert and oriented Extremities: No edema, no clubbing ,no cyanosis Skin: No rashes, no ulcers,no icterus       Data Reviewed: I have personally reviewed following labs and imaging studies  CBC: Recent Labs  Lab 10/20/22 1143 10/21/22 0207 10/21/22 0640 10/22/22 0207 10/26/22 1133  WBC 12.4* 8.8 11.1* 10.4 12.4*  HGB 11.8* 9.7* 11.5* 11.0* 12.2  HCT 36.8 31.8* 36.5 35.1* 38.6  MCV 92.7 95.2 90.8 91.2 89.8  PLT 200 156 192 201 475*   Basic Metabolic Panel: Recent Labs  Lab 10/20/22 1143 10/20/22 2145 10/21/22 0640 10/22/22 0207 10/23/22 0102 10/24/22 0127 10/26/22 1133  NA  --   --  136 131* 136 138 139  K  --   --  3.3* 3.2* 3.3* 3.6 3.9  CL  --   --  101 96* 105 108 105  CO2  --   --  23 23 22 23 24   GLUCOSE  --   --  113* 443* 108* 110* 104*   BUN  --   --  10 7* 10 9 13   CREATININE  --   --  0.97 1.03* 0.93 0.84 0.93  CALCIUM  --   --  8.2* 7.3* 8.3* 8.3* 8.8*  MG 1.8 1.8  --   --   --   --   --      Recent Results (from the past 240 hour(s))  Blood culture (routine x 2)     Status: None   Collection Time: 10/19/22  1:01 PM   Specimen: BLOOD  Result Value Ref Range Status   Specimen Description BLOOD SITE NOT SPECIFIED  Final   Special Requests   Final    BOTTLES DRAWN AEROBIC AND ANAEROBIC Blood Culture adequate volume   Culture   Final    NO GROWTH 5 DAYS Performed at [facility], [address] [place], [place] [address]  Report Status 10/24/2022 FINAL  Final  Blood culture (routine x 2)     Status: None   Collection Time: 10/19/22  1:06 PM   Specimen: BLOOD LEFT ARM  Result Value Ref Range Status   Specimen Description BLOOD LEFT ARM  Final   Special Requests   Final    BOTTLES DRAWN AEROBIC AND ANAEROBIC Blood Culture adequate volume   Culture   Final    NO GROWTH 5 DAYS Performed at [facility], [address], [place], [place] [address]    Report Status 10/24/2022 FINAL  Final  C Difficile Quick Screen w PCR reflex     Status: None   Collection Time: 10/21/22  7:10 AM   Specimen: STOOL  Result Value Ref Range Status   C Diff antigen NEGATIVE NEGATIVE Final   C Diff toxin NEGATIVE NEGATIVE Final   C Diff interpretation No C. difficile detected.  Final    Comment: Performed at [facility], [address], [place], [place] [address]  Aerobic/Anaerobic Culture w Gram Stain (surgical/deep wound)     Status: None (Preliminary result)   Collection Time: 10/21/22  4:10 PM   Specimen: BILE  Result Value Ref Range Status   Specimen Description BILE  Final   Special Requests FROM GALLBLADDER DRAIN  Final   Gram Stain   Final    ABUNDANT WBC PRESENT, PREDOMINANTLY PMN FEW GRAM POSITIVE COCCI IN CHAINS FEW GRAM NEGATIVE RODS Performed at [facility], [address],  [place], [place] [address]    Culture   Final    FEW HAFNIA ALVEI NO ANAEROBES ISOLATED; CULTURE IN PROGRESS FOR 5 DAYS    Report Status PENDING  Incomplete   Organism ID, Bacteria HAFNIA ALVEI  Final      Susceptibility   Hafnia alvei - MIC*    AMPICILLIN RESISTANT Resistant     CEFTAZIDIME <=1 SENSITIVE Sensitive     CEFTRIAXONE <=0.25 SENSITIVE Sensitive     CIPROFLOXACIN <=0.25 SENSITIVE Sensitive     GENTAMICIN <=1 SENSITIVE Sensitive     IMIPENEM <=0.25 SENSITIVE Sensitive     TRIMETH/SULFA <=20 SENSITIVE Sensitive     AMPICILLIN/SULBACTAM RESISTANT Resistant     PIP/TAZO <=4 SENSITIVE Sensitive     * FEW HAFNIA ALVEI     Radiology Studies: ECHOCARDIOGRAM LIMITED  Result Date: 10/25/2022    ECHOCARDIOGRAM LIMITED REPORT   Patient Name:   JONIQUA LUKINS Date of Exam: 10/25/2022 Medical Rec #:  [id number]              Height:       71.0 in Accession #:    [id number]             Weight:       198.4 lb Date of Birth:  1940/07/18               BSA:          2.101 m Patient Age:    82 years               BP:           108/67 mmHg Patient Gender: F                      HR:           115 bpm. Exam Location:  Inpatient Procedure: Limited Echo, Cardiac Doppler and Color Doppler Indications:    A-Fib  History:  Patient has prior history of Echocardiogram examinations, most                 recent 11/23/2021. Abnormal ECG, Arrythmias:Atrial Fibrillation;                 Risk Factors:Hypertension.  Sonographer:    Darlys Gales Referring Phys: [id number] [place] THOMAS O'NEAL IMPRESSIONS  1. Left ventricular ejection fraction, by estimation, is 60 to 65%. The left ventricle has normal function. The left ventricle has no regional wall motion abnormalities. There is mild concentric left ventricular hypertrophy. Left ventricular diastolic parameters are indeterminate.  2. Left atrial size was moderately dilated.  3. The mitral valve is degenerative. Trivial mitral valve regurgitation. Probably moderate  mitral stenosis. The mean mitral valve gradient is 6.0 mmHg. Severe mitral annular calcification. Full doppler interrogation of the mitral valve was not done.  4. The aortic valve is tricuspid. No doppler interrogation of aortic valve.  5. Tricuspid regurgitation signal is inadequate for assessing PA pressure.  6. IVC not visualized.  7. The patient was in atrial fibrillation.  8. Limited echo with minimal doppler, poor images. FINDINGS  Left Ventricle: Left ventricular ejection fraction, by estimation, is 60 to 65%. The left ventricle has normal function. The left ventricle has no regional wall motion abnormalities. There is mild concentric left ventricular hypertrophy. Left ventricular diastolic parameters are indeterminate. Right Ventricle: Tricuspid regurgitation signal is inadequate for assessing PA pressure. Left Atrium: Left atrial size was moderately dilated. Right Atrium: Right atrial size was normal in size. Mitral Valve: The mitral valve is degenerative in appearance. There is mild calcification of the mitral valve leaflet(s). Severe mitral annular calcification. Trivial mitral valve regurgitation. Moderate mitral valve stenosis. MV peak gradient, 13.0 mmHg. The mean mitral valve gradient is 6.0 mmHg. Aortic Valve: The aortic valve is tricuspid. Venous: The inferior vena cava was not well visualized. IAS/Shunts: No atrial level shunt detected by color flow Doppler. LEFT VENTRICLE PLAX 2D LVOT diam:     1.80 cm LVOT Area:     2.54 cm  LEFT ATRIUM              Index        RIGHT ATRIUM           Index LA Vol (A2C):   117.0 ml 55.68 ml/m  RA Area:     16.00 cm LA Vol (A4C):   81.8 ml  38.93 ml/m  RA Volume:   39.40 ml  18.75 ml/m LA Biplane Vol: 102.0 ml 48.54 ml/m  MITRAL VALVE MV Peak grad: 13.0 mmHg  SHUNTS MV Mean grad: 6.0 mmHg   Systemic Diam: 1.80 cm MV Vmax:      1.80 m/s MV Vmean:     110.0 cm/s Dalton McleanMD Electronically signed by Wilfred Lacy Signature Date/Time: 10/25/2022/3:42:04 PM     Final     Scheduled Meds:  amiodarone  400 mg Oral BID   Followed by   [name] ON 11/02/2022] amiodarone  200 mg Oral Daily   apixaban  5 mg Oral BID   Chlorhexidine Gluconate Cloth  6 each Topical Daily   metoprolol tartrate  12.5 mg Oral BID   mometasone-formoterol  2 puff Inhalation BID   sodium chloride flush  10-40 mL Intracatheter Q12H   sodium chloride flush  3 mL Intravenous Q12H   sodium chloride flush  5 mL Intracatheter Q8H   sulfamethoxazole-trimethoprim  1 tablet Oral Q12H   Continuous Infusions:     LOS: 7  days   Burnadette Pop, MD [facility] P6/08/2022, 12:24 PM

## 2022-10-26 NOTE — Progress Notes (Signed)
Referring Provider(s): * No referring provider recorded for this case *  Supervising Physician: Irish Lack  Patient Status:  [facility] - In-pt  Chief Complaint:  Acute cholecystitis  Brief History:  Laura Mendez is a 82 y.o. female admitted with A.fib with RVR and leukocytosis.  Her workup findings are consistent with acute cholecystitis.   Surgery evaluated patient and recommended perc chole drain as she is not a great operative candidate at present time due to acute illness and A.fib.  She underwent perc chole by Dr. Lowella Dandy on 10/21/22  Subjective:  Doing ok. A little nervous about taking care of her perc chole at home.  Allergies: Clarithromycin, Aspirin, Ibuprofen, Codeine, Codeine phosphate, Diltiazem, Methotrexate, Tape, Tapentadol, and Verapamil hcl er  Medications: Prior to Admission medications   Medication Sig Start Date End Date Taking? Authorizing Provider  albuterol (VENTOLIN HFA) 108 (90 Base) MCG/ACT inhaler INHALE 2 PUFFS BY MOUTH EVERY 4 HOURS AS NEEDED 10/06/22  Yes Jetty Duhamel D, MD  letrozole [facility]) 2.5 MG tablet Take 1 tablet by mouth once daily 10/08/22  Yes Serena Croissant, MD  Multiple Vitamin (MULTIVITAMIN) tablet Take 1 tablet by mouth daily.   Yes [provider]  pantoprazole (PROTONIX) 40 MG tablet Take 1 tablet by mouth once daily 10/08/22  Yes [name], MD  acetaminophen (TYLENOL) 325 MG tablet Take 2 tablets (650 mg total) by mouth every 4 (four) hours as needed for mild pain (or temp > 37.5 C (99.5 F)). 07/14/21   Angiulli, Mcarthur Rossetti, PA-C  ammonium lactate (LAC-HYDRIN) 12 % cream Apply 1 Application topically as needed for dry skin. 04/28/22   Ellsworth Lennox, PA-C  apixaban (ELIQUIS) 5 MG TABS tablet Take 1 tablet by mouth twice daily 07/26/22   Lanier Prude, MD  DUPIXENT 300 MG/2ML SOPN Inject 300 mg into the skin every 14 (fourteen) days. 10/30/21   [provider]  fluticasone-salmeterol (ADVAIR)  250-50 MCG/ACT AEPB INHALE 1 PUFF INTO LUNGS TWICE DAILY Patient taking differently: Inhale 1 puff into the lungs in the morning and at bedtime. 05/20/22   Jetty Duhamel D, MD  triamcinolone cream (KENALOG) 0.1 % Apply 1 Application topically 2 (two) times daily. 04/28/22   Ellsworth Lennox, PA-C     Vital Signs: BP 116/82 (BP Location: Left Arm)   Pulse 69   Temp 98 F (36.7 C) (Oral)   Resp (!) 21   Ht 5\' 11"  (1.803 m)   Wt 198 lb 6.6 oz (90 kg)   SpO2 96%   BMI 27.67 kg/m   Physical Exam Awake and alert Sitting up in chair. Drain Location: RUQ Perc Chole Size: Fr size: 10 Fr Date of placement: 10/21/22  Currently to: Drain collection device: gravity 24 hour output:  Output by Drain (mL) 10/24/22 0701 - 10/24/22 1900 10/24/22 1901 - 10/25/22 0700 10/25/22 0701 - 10/25/22 1900 10/25/22 1901 - 10/26/22 0700 10/26/22 0701 - 10/26/22 1410  Closed System Drain Superior;Right Abdomen  10 Fr. 0       Current examination: Flushes/aspirates easily.  Insertion site unremarkable. Suture and stat lock in place. Dressed appropriately.    Labs:  CBC: Recent Labs    10/21/22 0207 10/21/22 0640 10/22/22 0207 10/26/22 1133  WBC 8.8 11.1* 10.4 12.4*  HGB 9.7* 11.5* 11.0* 12.2  HCT 31.8* 36.5 35.1* 38.6  PLT 156 192 201 475*    COAGS: Recent Labs    10/19/22 1123 10/20/22 0225 10/20/22 2145 10/21/22 0207 10/21/22 1610 10/22/22 9604  INR 2.0*  --   --   --   --   --   APTT  --    < > 89* 157* 70* 69*   < > = values in this interval not displayed.    BMP: Recent Labs    10/22/22 0207 10/23/22 0102 10/24/22 0127 10/26/22 1133  NA 131* 136 138 139  K 3.2* 3.3* 3.6 3.9  CL 96* 105 108 105  CO2 23 22 23 24   GLUCOSE 443* 108* 110* 104*  BUN 7* 10 9 13   CALCIUM 7.3* 8.3* 8.3* 8.8*  CREATININE 1.03* 0.93 0.84 0.93  GFRNONAA 54* >60 >60 >60    LIVER FUNCTION TESTS: Recent Labs    10/19/22 1123 10/22/22 0207  BILITOT 1.2 0.3  AST 36 31  ALT 20 21  ALKPHOS 77  44  PROT 7.0 4.8*  ALBUMIN 3.1* 1.9*    Assessment and Plan:  Acute cholecystitis - not currently a candidate for cholecystectomy  I have demonstrated to the patient how to flush the drain and how to empty.   Percutaneous cholecystostomy drain to remain in place at least 6 weeks.   Recommend fluoroscopy with injection of the drain in IR to evaluate for patency of the cystic duct.  If the duct is patent and general surgery feels patient is stable for cholecystectomy, the drain would be removed at time of surgery.  If the duct is patent and general surgery feels patient is NEVER a candidate for cholecystectomy, drain can be capped for a trial.  If symptoms recur, then place to gravity bag again.  If trial is successful, discuss possible removal of the drain.  If trial in unsuccessful, then patient will need routine exchanges of the  chole tube about every 8-10 weeks.  I have entered an order for outpatient follow up at our clinic. IR clinic schedulers will call patient with appointment details.  Electronically Signed: Gwynneth Macleod, PA-C 10/26/2022, 12:39 PM    I spent a total of 25 Minutes at the the patient's bedside AND on the patient's hospital floor or unit, greater than 50% of which was counseling/coordinating care for perc chole teaching and follow up.

## 2022-10-26 NOTE — Discharge Instructions (Signed)
Percutaneous cholecystostomy patient care instructions  Information:  A cholecystostomy tube provides a route of bile drainage from an infected gallbladder. The catheter is secured to the skin by an adhesive device or sutures to prevent accidental removal of the catheter. A dressing is placed over the fixation device and is taped or secured with adhesive tape. The end of the catheter is connected to a drainage bag to collect your bile.  Instructions:   Try to prevent tugging on the catheter o If connected to a bag, the bag should be emptied as follows: 1. Unscrew the blue piece on the bottom of the bag 2. Pour contents into toilet 3. Re-tighten blue piece on bag and 4. Re-secure bag onto leg with straps.   Unscrew the drain from the bag and flush the drain into the body with 5 mL of normal saline daily. This will keep the bag from becoming clogged with thick bile or sludge.   Keep dressing as dry as possible. When bathing, do not sit in water deep enough to immerse catheter fixation device/dressing. Showering is permitted if the dressing is kept as dry as possible (you can tape a piece of plastic wrap to covers the dressing). If the dressing becomes wet for any reason, remove it and replace with new piece of gauze and tape, leaving the fixation device alone. (A slightly damp dressing exterior does not require changing.) You may also change the dressing as needed for cleanliness.   If the fixation device loosens from the skin, dry it and the skin, then tape the device securely to the skin and call Interventional Radiology the next business day.   Keep the skin around the catheter clean. Slight accumulation of secretions around the catheter at the skin entry site is common and not a cause for concern. You may gently cleanse/remove any excess build-up.     If you have issues with your drain, please call [phone] for assistance.    Occasionally, a situation will require prompt attention and  an emergency room visit may be necessary:   A catheter which has been pulled out   A sudden decrease in volume of bile drainage   Worsening jaundice (yellowing of the skin and eyes) or itching   Fever over 101 degrees, chills, nausea, vomiting   New or increasing right upper quadrant pain   Blood coming out of the catheter   Bile coming out around catheter (mild staining of dressing is normal but saturation of dressing is not)

## 2022-10-26 NOTE — Progress Notes (Addendum)
Cardiology Progress Note  Patient ID: Laura Mendez MRN: [id number] DOB: [birth year] Date of Encounter: 10/26/2022  Primary Cardiologist: Donato Schultz, MD  Subjective   Chief Complaint: SOB  HPI: Back in NSR. 1+ pitting edema. Slightly volume up.   ROS:  All other ROS reviewed and negative. Pertinent positives noted in the HPI.     Inpatient Medications  Scheduled Meds:  amiodarone  400 mg Oral BID   Followed by   [name] ON 11/02/2022] amiodarone  200 mg Oral Daily   amoxicillin-clavulanate  1 tablet Oral Q12H   apixaban  5 mg Oral BID   Chlorhexidine Gluconate Cloth  6 each Topical Daily   metoprolol tartrate  12.5 mg Oral BID   mometasone-formoterol  2 puff Inhalation BID   sodium chloride flush  10-40 mL Intracatheter Q12H   sodium chloride flush  3 mL Intravenous Q12H   sodium chloride flush  5 mL Intracatheter Q8H   Continuous Infusions:  PRN Meds: acetaminophen **OR** acetaminophen, albuterol, hydrALAZINE, loperamide, ondansetron **OR** ondansetron (ZOFRAN) IV, sodium chloride flush   Vital Signs   Vitals:   10/25/22 1948 10/25/22 2314 10/26/22 0319 10/26/22 0747  BP: 121/72 (!) 117/93 107/69 (!) 127/55  Pulse: 95 100 100 69  Resp: 20 20 20 16   Temp: 98.3 F (36.8 C) 97.6 F (36.4 C) 98.3 F (36.8 C) 97.6 F (36.4 C)  TempSrc: Oral Oral Oral Axillary  SpO2: 94% 98% 96% 97%  Weight:      Height:        Intake/Output Summary (Last 24 hours) at 10/26/2022 0837 Last data filed at 10/26/2022 0754 Gross per 24 hour  Intake --  Output 850 ml  Net -850 ml      10/19/2022    4:00 PM 10/05/2022   10:53 AM 07/29/2022    9:34 AM  Last 3 Weights  Weight (lbs) 198 lb 6.6 oz 212 lb 203 lb  Weight (kg) 90 kg 96.163 kg 92.08 kg      Telemetry  Overnight telemetry shows sinus rhythm 60 to 70 bpm, PACs, which I personally reviewed.     Physical Exam   Vitals:   10/25/22 1948 10/25/22 2314 10/26/22 0319 10/26/22 0747  BP: 121/72 (!) 117/93 107/69 (!)  127/55  Pulse: 95 100 100 69  Resp: 20 20 20 16   Temp: 98.3 F (36.8 C) 97.6 F (36.4 C) 98.3 F (36.8 C) 97.6 F (36.4 C)  TempSrc: Oral Oral Oral Axillary  SpO2: 94% 98% 96% 97%  Weight:      Height:        Intake/Output Summary (Last 24 hours) at 10/26/2022 0837 Last data filed at 10/26/2022 0754 Gross per 24 hour  Intake --  Output 850 ml  Net -850 ml       10/19/2022    4:00 PM 10/05/2022   10:53 AM 07/29/2022    9:34 AM  Last 3 Weights  Weight (lbs) 198 lb 6.6 oz 212 lb 203 lb  Weight (kg) 90 kg 96.163 kg 92.08 kg    Body mass index is 27.67 kg/m.  General: Well nourished, well developed, in no acute distress Head: Atraumatic, normal size  Eyes: PEERLA, EOMI  Neck: Supple, no JVD Endocrine: No thryomegaly Cardiac: Normal S1, S2; RRR; no murmurs, rubs, or gallops Lungs: Clear to auscultation bilaterally, no wheezing, rhonchi or rales  Abd: Soft, nontender, no hepatomegaly  Ext: 1+ pitting edema Musculoskeletal: No deformities, BUE and BLE strength normal and equal Skin: Warm and  dry, no rashes   Neuro: Alert and oriented to person, place, time, and situation, CNII-XII grossly intact, no focal deficits  Psych: Normal mood and affect   Labs  High Sensitivity Troponin:  No results for input(s): "TROPONINIHS" in the last 720 hours.   Cardiac EnzymesNo results for input(s): "TROPONINI" in the last 168 hours. No results for input(s): "TROPIPOC" in the last 168 hours.  Chemistry Recent Labs  Lab 10/19/22 1123 10/19/22 1129 10/22/22 0207 10/23/22 0102 10/24/22 0127  NA 131*   < > 131* 136 138  K 3.3*   < > 3.2* 3.3* 3.6  CL 97*   < > 96* 105 108  CO2 18*   < > 23 22 23   GLUCOSE 152*   < > 443* 108* 110*  BUN 23   < > 7* 10 9  CREATININE 1.64*   < > 1.03* 0.93 0.84  CALCIUM 8.8*   < > 7.3* 8.3* 8.3*  PROT 7.0  --  4.8*  --   --   ALBUMIN 3.1*  --  1.9*  --   --   AST 36  --  31  --   --   ALT 20  --  21  --   --   ALKPHOS 77  --  44  --   --   BILITOT 1.2  --   0.3  --   --   GFRNONAA 31*   < > 54* >60 >60  ANIONGAP 16*   < > 12 9 7    < > = values in this interval not displayed.    Hematology Recent Labs  Lab 10/21/22 0207 10/21/22 0640 10/22/22 0207  WBC 8.8 11.1* 10.4  RBC 3.34* 4.02 3.85*  HGB 9.7* 11.5* 11.0*  HCT 31.8* 36.5 35.1*  MCV 95.2 90.8 91.2  MCH 29.0 28.6 28.6  MCHC 30.5 31.5 31.3  RDW 14.4 14.3 14.4  PLT 156 192 201   BNPNo results for input(s): "BNP", "PROBNP" in the last 168 hours.  DDimer No results for input(s): "DDIMER" in the last 168 hours.   Radiology  ECHOCARDIOGRAM LIMITED  Result Date: 10/25/2022    ECHOCARDIOGRAM LIMITED REPORT   Patient Name:   Laura Mendez Date of Exam: 10/25/2022 Medical Rec #:  [id number]              Height:       71.0 in Accession #:    [id number]             Weight:       198.4 lb Date of Birth:  [birth year]               BSA:          2.101 m Patient Age:    82 years               BP:           108/67 mmHg Patient Gender: F                      HR:           115 bpm. Exam Location:  Inpatient Procedure: Limited Echo, Cardiac Doppler and Color Doppler Indications:    A-Fib  History:        Patient has prior history of Echocardiogram examinations, most                 recent 11/23/2021. Abnormal ECG, Arrythmias:Atrial Fibrillation;  Risk Factors:Hypertension.  Sonographer:    Darlys Gales Referring Phys: [id number] Reinbeck THOMAS O'NEAL IMPRESSIONS  1. Left ventricular ejection fraction, by estimation, is 60 to 65%. The left ventricle has normal function. The left ventricle has no regional wall motion abnormalities. There is mild concentric left ventricular hypertrophy. Left ventricular diastolic parameters are indeterminate.  2. Left atrial size was moderately dilated.  3. The mitral valve is degenerative. Trivial mitral valve regurgitation. Probably moderate mitral stenosis. The mean mitral valve gradient is 6.0 mmHg. Severe mitral annular calcification. Full doppler interrogation  of the mitral valve was not done.  4. The aortic valve is tricuspid. No doppler interrogation of aortic valve.  5. Tricuspid regurgitation signal is inadequate for assessing PA pressure.  6. IVC not visualized.  7. The patient was in atrial fibrillation.  8. Limited echo with minimal doppler, poor images. FINDINGS  Left Ventricle: Left ventricular ejection fraction, by estimation, is 60 to 65%. The left ventricle has normal function. The left ventricle has no regional wall motion abnormalities. There is mild concentric left ventricular hypertrophy. Left ventricular diastolic parameters are indeterminate. Right Ventricle: Tricuspid regurgitation signal is inadequate for assessing PA pressure. Left Atrium: Left atrial size was moderately dilated. Right Atrium: Right atrial size was normal in size. Mitral Valve: The mitral valve is degenerative in appearance. There is mild calcification of the mitral valve leaflet(s). Severe mitral annular calcification. Trivial mitral valve regurgitation. Moderate mitral valve stenosis. MV peak gradient, 13.0 mmHg. The mean mitral valve gradient is 6.0 mmHg. Aortic Valve: The aortic valve is tricuspid. Venous: The inferior vena cava was not well visualized. IAS/Shunts: No atrial level shunt detected by color flow Doppler. LEFT VENTRICLE PLAX 2D LVOT diam:     1.80 cm LVOT Area:     2.54 cm  LEFT ATRIUM              Index        RIGHT ATRIUM           Index LA Vol (A2C):   117.0 ml 55.68 ml/m  RA Area:     16.00 cm LA Vol (A4C):   81.8 ml  38.93 ml/m  RA Volume:   39.40 ml  18.75 ml/m LA Biplane Vol: 102.0 ml 48.54 ml/m  MITRAL VALVE MV Peak grad: 13.0 mmHg  SHUNTS MV Mean grad: 6.0 mmHg   Systemic Diam: 1.80 cm MV Vmax:      1.80 m/s MV Vmean:     110.0 cm/s Dalton McleanMD Electronically signed by Wilfred Lacy Signature Date/Time: 10/25/2022/3:42:04 PM    Final    DG CHEST PORT 1 VIEW  Result Date: 10/24/2022 CLINICAL DATA:  Diarrhea. EXAM: PORTABLE CHEST 1 VIEW COMPARISON:   03/29/2022. FINDINGS: Cardiac silhouette is normal in size. No mediastinal masses. Prominent left pulmonary artery and right superior pulmonary vein, stable from prior exams. No hilar masses. Left greater than right lung base opacities suspected to be atelectasis, similar to the exam from 03/29/2022. Remainder of the lungs is clear. No convincing pleural effusion or pneumothorax. Right sided PICC has its tip in the mid superior vena cava. IMPRESSION: 1. Lung base opacities, left greater than right, suspected to be atelectasis/scarring. Consider infection if there are consistent clinical findings. 2. Right PICC, line tip in the mid superior vena cava. Electronically Signed   By: Amie Portland M.D.   On: 10/24/2022 09:32    Cardiac Studies  TTE 10/25/2022  1. Left ventricular ejection fraction, by estimation, is 60 to 65%.  The  left ventricle has normal function. The left ventricle has no regional  wall motion abnormalities. There is mild concentric left ventricular  hypertrophy. Left ventricular diastolic  parameters are indeterminate.   2. Left atrial size was moderately dilated.   3. The mitral valve is degenerative. Trivial mitral valve regurgitation.  Probably moderate mitral stenosis. The mean mitral valve gradient is 6.0  mmHg. Severe mitral annular calcification. Full doppler interrogation of  the mitral valve was not done.   4. The aortic valve is tricuspid. No doppler interrogation of aortic  valve.   5. Tricuspid regurgitation signal is inadequate for assessing PA  pressure.   6. IVC not visualized.   7. The patient was in atrial fibrillation.   8. Limited echo with minimal doppler, poor images.   Patient Profile  Laura Mendez is a 82 y.o. female with paroxysmal atrial fibrillation, stroke, HFpEF, breast cancer who was admitted on 10/19/2022 for acute cholecystitis status post percutaneous cholecystostomy tube placement.  Course complicated by A-fib with RVR.   Assessment &  Plan   # A-fib with RVR -Driven by acute cholecystitis.  She has converted to sinus rhythm on IV amiodarone.  We will plan to transition to oral amiodarone to keep her in rhythm.  400 mg twice daily for 7 days followed by 200 mg daily for 21 days and stop. -Add metoprolol to tartrate 12.5 mg twice daily. -Continue Eliquis. -Repeat echo shows stable LV and RV function. -Blood pressure is much improved in sinus rhythm. -As long as she remains stable today from a rhythm standpoint likely could be discharged tomorrow if medically ready.  # LE Edema # HFpEF -+1.3 L this admit. 1+ Pitting edema. Lasix 40 mg PO x 1 today. Suspect this is iatrogenic.       For questions or updates, please contact [facility] Please consult [email or web address] for contact info under        Signed, Gerri Spore T. Flora Lipps, MD, [facility] [place]  [facility]  10/26/2022 8:37 AM

## 2022-10-26 NOTE — Progress Notes (Signed)
Occupational Therapy Treatment Patient Details Name: Laura Mendez MRN: [id number] DOB: 1941/03/21 Today's Date: 10/26/2022   History of present illness 82 y.o. female with medical history significant of breast CA, chronic diastolic CHF, afib, and CVA presenting with 3-4 days of diarrhea. CT with acute cholecystitis.   OT comments  Patient up in recliner upon entry and able to ambulate to sink for grooming with min guard assist. Patient improving with toilet transfers with min guard assist and min assist to complete toilet hygiene from max assist. Patient is making good gains and will continue to be followed by acute OT to address grooming, toilet transfers, and self care.    Recommendations for follow up therapy are one component of a multi-disciplinary discharge planning process, led by the attending physician.  Recommendations may be updated based on patient status, additional functional criteria and insurance authorization.    Assistance Recommended at Discharge Intermittent Supervision/Assistance  Patient can return home with the following  Assist for transportation;Assistance with cooking/housework;A lot of help with bathing/dressing/bathroom;A lot of help with walking and/or transfers   Equipment Recommendations  None recommended by OT    Recommendations for Other Services      Precautions / Restrictions Precautions Precautions: Fall Precaution Comments: loose stool, watch HR Restrictions Weight Bearing Restrictions: No       Mobility Bed Mobility Overal bed mobility: Needs Assistance             General bed mobility comments: sitting EOB upon arrival    Transfers Overall transfer level: Needs assistance Equipment used: Rolling walker (2 wheels) Transfers: Sit to/from Stand, Bed to chair/wheelchair/BSC Sit to Stand: Min guard     Step pivot transfers: Min guard     General transfer comment: min guard with verbal cues for hand placement and body  mechanics     Balance                                           ADL either performed or assessed with clinical judgement   ADL Overall ADL's : Needs assistance/impaired     Grooming: Wash/dry hands;Wash/dry face;Oral care;Brushing hair;Min guard;Sitting;Standing Grooming Details (indicate cue type and reason): able to stand at sink for oral care and completed grooming seated Upper Body Bathing: Supervision/ safety;Sitting               Toilet Transfer: Min guard;Rolling walker (2 wheels);BSC/3in1   Toileting- Clothing Manipulation and Hygiene: Minimal assistance;Sit to/from stand Toileting - Clothing Manipulation Details (indicate cue type and reason): min assist to complete            Extremity/Trunk Assessment              Vision       Perception     Praxis      Cognition Arousal/Alertness: Awake/alert Behavior During Therapy: WFL for tasks assessed/performed Overall Cognitive Status: Impaired/Different from baseline Area of Impairment: Memory                     Memory: Decreased short-term memory         General Comments: tangential and required redirecting        Exercises      Shoulder Instructions       General Comments      Pertinent Vitals/ Pain       Pain Assessment Pain Assessment: No/denies pain  Home Living                                          Prior Functioning/Environment              Frequency  Min 2X/week        Progress Toward Goals  OT Goals(current goals can now be found in the care plan section)  Progress towards OT goals: Progressing toward goals  Acute Rehab OT Goals Patient Stated Goal: go hom e OT Goal Formulation: With patient Time For Goal Achievement: 11/04/22 Potential to Achieve Goals: Fair ADL Goals Pt Will Perform Grooming: with supervision;standing Pt Will Perform Upper Body Dressing: with set-up;sitting Pt Will Perform Lower Body  Dressing: with supervision;sit to/from stand Pt Will Transfer to Toilet: with supervision;ambulating;regular height toilet  Plan Discharge plan remains appropriate    Co-evaluation                 AM-PAC OT "6 Clicks" Daily Activity     Outcome Measure   Help from another person eating meals?: None Help from another person taking care of personal grooming?: A Little Help from another person toileting, which includes using toliet, bedpan, or urinal?: A Lot Help from another person bathing (including washing, rinsing, drying)?: A Lot Help from another person to put on and taking off regular upper body clothing?: A Little Help from another person to put on and taking off regular lower body clothing?: A Lot 6 Click Score: 16    End of Session Equipment Utilized During Treatment: Gait belt;Rolling walker (2 wheels)  OT Visit Diagnosis: Unsteadiness on feet (R26.81);Muscle weakness (generalized) (M62.81);Other symptoms and signs involving cognitive function   Activity Tolerance Patient tolerated treatment well   Patient Left in chair;with call bell/phone within reach;with chair alarm set   Nurse Communication Mobility status        Time: 0981-1914 OT Time Calculation (min): 31 min  Charges: OT General Charges $OT Visit: 1 Visit OT Treatments $Self Care/Home Management : 23-37 mins  Alfonse Flavors, OTA Acute Rehabilitation Services  Office [phone]   Dewain Penning 10/26/2022, 2:55 PM

## 2022-10-27 DIAGNOSIS — K810 Acute cholecystitis: Secondary | ICD-10-CM | POA: Diagnosis not present

## 2022-10-27 DIAGNOSIS — I480 Paroxysmal atrial fibrillation: Secondary | ICD-10-CM | POA: Diagnosis not present

## 2022-10-27 MED ORDER — AMIODARONE HCL 400 MG PO TABS: 400.0000 mg | ORAL_TABLET | Freq: Two times a day (BID) | ORAL | 0 refills | Status: DC

## 2022-10-27 MED ORDER — AMIODARONE HCL 200 MG PO TABS: ORAL_TABLET | ORAL | 0 refills | Status: DC

## 2022-10-27 MED ORDER — AMIODARONE HCL 200 MG PO TABS: 200.0000 mg | ORAL_TABLET | Freq: Every day | ORAL | 1 refills | Status: DC

## 2022-10-27 MED ORDER — SULFAMETHOXAZOLE-TRIMETHOPRIM 800-160 MG PO TABS: 1.0000 | ORAL_TABLET | Freq: Two times a day (BID) | ORAL | 0 refills | Status: AC

## 2022-10-27 NOTE — Discharge Summary (Signed)
Physician Discharge Summary  Laura Mendez:[id number] DOB: 01-26-1941 DOA: 10/19/2022  PCP: [name], MD  Admit date: 10/19/2022 Discharge date: 10/27/2022  Admitted From: Home Disposition:  Home  Discharge Condition:Stable CODE STATUS:FULL Diet recommendation: Heart Healthy  Brief/Interim Summary: Patient is a 82 year old female with history of breast cancer, chronic diastolic CHF, A-fib, CVA who presented with complaint of diarrhea, nausea, vomiting ,abdominal pain.  On presentation, she was in A-fib with RVR with heart rate in the range of 150.  CT abdomen/pelvis was consistent of cholecystitis, general surgery consulted.  Started on amiodarone drip.  Cardiology following. S/p percutaneous cholecystostomy on 5/30 . Hospital course remarkable for persistent A-fib with RVR, now converted to normal sinus rhythm.  Medically stable for discharge home today.  She will follow-up with general surgery at the end of the month.  Following problems were addressed during the hospitalization:  Acute cholecystitis: Presented with nausea, vomiting, abdominal pain.  Lab work showed leukocytosis with WBC count of 21,000, AKI with creatinine of 1.6, normal liver enzymes. CT abdomen/pelvis showed abnormal gallbladder with dilatation and stones,significant wall thickening, edema and adjacent stranding consistent with  acute cholecystitis. Possible stone in the neck of the gallbladder . General surgery consulted.  Started on zosyn. Follow-up  blood cultures:NGTD  Leukocytosis resolved.  Lactic acidosis has resolved. IR did cholecystostomy drain placement.  General surgery signed off and follow-up as an outpatient.  Aerobic/anaerobic cultures showing HAFNIA ALVEI ,started on bactrim. She will follow-up with general surgery, IR as an outpatient.  She will be discharged with right upper quadrant drain   A-fib with RVR: Takes Eliquis at home.  Heart rate in the range of 150 on presentation.   Started on amiodarone drip.  Cardiology consulted and following.  Eliquis resumed.  Now normal sinus rhythm. Amio changed to oral.     AKI: Presented with creatinine in the range of 1.6.  Kidney function  normalized with IV fluid.    History of CVA: History of mild to moderate dementia.  Continue delirium precautions   Asthma/COPD: Continue bronchodilators as needed   Atopic dermatitis: On Dupixent at home   History of breast cancer: S/p lumpectomy.  On letrozole at home.   Pancreatic lesion: 2.7 cm cystic lesion seen along the tail of the pancreas. Recommended follow up imaging in 6 months.   Generalized weakness: PT/OT consulted, recommended home health   Discharge Diagnoses:  Principal Problem:   Acute cholecystitis Active Problems:   Asthma with COPD (HCC)   Paroxysmal atrial fibrillation (HCC)   Malignant neoplasm of lower-outer quadrant of right breast of female, estrogen receptor positive (HCC)   CVA (cerebral vascular accident) (HCC)   Atopic dermatitis   AKI (acute kidney injury) (HCC)   Pancreatic lesion    Discharge Instructions  Discharge Instructions     Diet - low sodium heart healthy   Complete by: As directed    Discharge instructions   Complete by: As directed    1)Please take prescribed medications as instructed 2)Follow up with general surgery at the end of the month.  You have an appointment on 6/28 3)Follow up with your PCP in a week   Increase activity slowly   Complete by: As directed    No wound care   Complete by: As directed       Allergies as of 10/27/2022       Reactions   Clarithromycin Anaphylaxis   "just about killed me"   Aspirin Hives   Ibuprofen Hives  Codeine Nausea And Vomiting   Codeine Phosphate Nausea And Vomiting   Diltiazem Rash   Methotrexate Other (See Comments)    tongue swelling and mouth soreness   Tape Itching, Other (See Comments)   Tears skin   Tapentadol Itching   Other reaction(s): Other (See  Comments) Tears skin   Verapamil Hcl Er Rash        Medication List     STOP taking these medications    acetaminophen 325 MG tablet Commonly known as: TYLENOL       TAKE these medications    albuterol 108 (90 Base) MCG/ACT inhaler Commonly known as: VENTOLIN HFA INHALE 2 PUFFS BY MOUTH EVERY 4 HOURS AS NEEDED   amiodarone 400 MG tablet Commonly known as: PACERONE Take 1 tablet (400 mg total) by mouth 2 (two) times daily for 5 days.   amiodarone 200 MG tablet Commonly known as: PACERONE Take 1 tablet (200 mg total) by mouth daily. Start taking on: November 02, 2022   ammonium lactate 12 % cream Commonly known as: Lac-Hydrin Apply 1 Application topically as needed for dry skin.   Dupixent 300 MG/2ML Sopn Generic drug: Dupilumab Inject 300 mg into the skin every 14 (fourteen) days.   Eliquis 5 MG Tabs tablet Generic drug: apixaban Take 1 tablet by mouth twice daily   fluticasone-salmeterol 250-50 MCG/ACT Aepb Commonly known as: ADVAIR INHALE 1 PUFF INTO LUNGS TWICE DAILY What changed: See the new instructions.   letrozole 2.5 MG tablet Commonly known as: FEMARA Take 1 tablet by mouth once daily   multivitamin tablet Take 1 tablet by mouth daily.   pantoprazole 40 MG tablet Commonly known as: PROTONIX Take 1 tablet by mouth once daily   sulfamethoxazole-trimethoprim 800-160 MG tablet Commonly known as: BACTRIM DS Take 1 tablet by mouth every 12 (twelve) hours for 5 days.   triamcinolone cream 0.1 % Commonly known as: KENALOG Apply 1 Application topically 2 (two) times daily.        Follow-up Information     Berna Bue, MD. Go on 11/19/2022.   Specialty: General Surgery Why: 9:50 AM, please arrive 30 min prior to appointment time to check in. Contact information: [address] [place] [address] [phone]         [name], MD. Schedule an appointment as soon as possible for a visit in 1 week(s).    Specialty: Internal Medicine Contact information: [address] [place] [address] [phone]                Allergies  Allergen Reactions   Clarithromycin Anaphylaxis    "just about killed me"   Aspirin Hives   Ibuprofen Hives   Codeine Nausea And Vomiting   Codeine Phosphate Nausea And Vomiting   Diltiazem Rash   Methotrexate Other (See Comments)     tongue swelling and mouth soreness   Tape Itching and Other (See Comments)    Tears skin   Tapentadol Itching    Other reaction(s): Other (See Comments) Tears skin   Verapamil Hcl Er Rash    Consultations: Cardiology, IR, general surgery   Procedures/Studies: ECHOCARDIOGRAM LIMITED  Result Date: 10/25/2022    ECHOCARDIOGRAM LIMITED REPORT   Patient Name:   DORIEN STAATS Date of Exam: 10/25/2022 Medical Rec #:  [id number]              Height:       71.0 in Accession #:    [id number]  Weight:       198.4 lb Date of Birth:  03/19/1941               BSA:          2.101 m Patient Age:    82 years               BP:           108/67 mmHg Patient Gender: F                      HR:           115 bpm. Exam Location:  Inpatient Procedure: Limited Echo, Cardiac Doppler and Color Doppler Indications:    A-Fib  History:        Patient has prior history of Echocardiogram examinations, most                 recent 11/23/2021. Abnormal ECG, Arrythmias:Atrial Fibrillation;                 Risk Factors:Hypertension.  Sonographer:    Darlys Gales Referring Phys: [id number] [place] THOMAS O'NEAL IMPRESSIONS  1. Left ventricular ejection fraction, by estimation, is 60 to 65%. The left ventricle has normal function. The left ventricle has no regional wall motion abnormalities. There is mild concentric left ventricular hypertrophy. Left ventricular diastolic parameters are indeterminate.  2. Left atrial size was moderately dilated.  3. The mitral valve is degenerative. Trivial mitral valve regurgitation. Probably moderate mitral  stenosis. The mean mitral valve gradient is 6.0 mmHg. Severe mitral annular calcification. Full doppler interrogation of the mitral valve was not done.  4. The aortic valve is tricuspid. No doppler interrogation of aortic valve.  5. Tricuspid regurgitation signal is inadequate for assessing PA pressure.  6. IVC not visualized.  7. The patient was in atrial fibrillation.  8. Limited echo with minimal doppler, poor images. FINDINGS  Left Ventricle: Left ventricular ejection fraction, by estimation, is 60 to 65%. The left ventricle has normal function. The left ventricle has no regional wall motion abnormalities. There is mild concentric left ventricular hypertrophy. Left ventricular diastolic parameters are indeterminate. Right Ventricle: Tricuspid regurgitation signal is inadequate for assessing PA pressure. Left Atrium: Left atrial size was moderately dilated. Right Atrium: Right atrial size was normal in size. Mitral Valve: The mitral valve is degenerative in appearance. There is mild calcification of the mitral valve leaflet(s). Severe mitral annular calcification. Trivial mitral valve regurgitation. Moderate mitral valve stenosis. MV peak gradient, 13.0 mmHg. The mean mitral valve gradient is 6.0 mmHg. Aortic Valve: The aortic valve is tricuspid. Venous: The inferior vena cava was not well visualized. IAS/Shunts: No atrial level shunt detected by color flow Doppler. LEFT VENTRICLE PLAX 2D LVOT diam:     1.80 cm LVOT Area:     2.54 cm  LEFT ATRIUM              Index        RIGHT ATRIUM           Index LA Vol (A2C):   117.0 ml 55.68 ml/m  RA Area:     16.00 cm LA Vol (A4C):   81.8 ml  38.93 ml/m  RA Volume:   39.40 ml  18.75 ml/m LA Biplane Vol: 102.0 ml 48.54 ml/m  MITRAL VALVE MV Peak grad: 13.0 mmHg  SHUNTS MV Mean grad: 6.0 mmHg   Systemic Diam: 1.80 cm MV Vmax:  1.80 m/s MV Vmean:     110.0 cm/s Dalton McleanMD Electronically signed by Wilfred Lacy Signature Date/Time: 10/25/2022/3:42:04 PM    Final     DG CHEST PORT 1 VIEW  Result Date: 10/24/2022 CLINICAL DATA:  Diarrhea. EXAM: PORTABLE CHEST 1 VIEW COMPARISON:  03/29/2022. FINDINGS: Cardiac silhouette is normal in size. No mediastinal masses. Prominent left pulmonary artery and right superior pulmonary vein, stable from prior exams. No hilar masses. Left greater than right lung base opacities suspected to be atelectasis, similar to the exam from 03/29/2022. Remainder of the lungs is clear. No convincing pleural effusion or pneumothorax. Right sided PICC has its tip in the mid superior vena cava. IMPRESSION: 1. Lung base opacities, left greater than right, suspected to be atelectasis/scarring. Consider infection if there are consistent clinical findings. 2. Right PICC, line tip in the mid superior vena cava. Electronically Signed   By: Amie Portland M.D.   On: 10/24/2022 09:32   [place] EKG SITE RITE  Result Date: 10/24/2022 If Site Rite image not attached, placement could not be confirmed due to current cardiac rhythm.  IR Perc Cholecystostomy  Addendum Date: 10/21/2022   ADDENDUM REPORT: 10/21/2022 17:16 ADDENDUM: Fluoroscopic and ultrasound images were taken and saved for documentation. Electronically Signed   By: Richarda Overlie M.D.   On: 10/21/2022 17:16   Result Date: 10/21/2022 INDICATION: 82 year old with cholelithiasis and concern for acute cholecystitis. Patient is not a good surgical candidate at this time. Plan for percutaneous cholecystostomy tube placement. EXAM: CHOLECYSTOSTOMY TUBE PLACEMENT WITH ULTRASOUND AND FLUOROSCOPIC GUIDANCE MEDICATIONS: Moderate sedation ANESTHESIA/SEDATION: Moderate (conscious) sedation was employed during this procedure. A total of Versed 0.5 mg and Fentanyl 25 mcg was administered intravenously by the radiology nurse. Total intra-service moderate Sedation Time: 18 minutes. The patient's level of consciousness and vital signs were monitored continuously by radiology nursing throughout the procedure under my direct  supervision. FLUOROSCOPY: Radiation Exposure Index (as provided by the fluoroscopic device): 24 mGy Kerma CONTRAST:  10 mL Omnipaque 300 COMPLICATIONS: None immediate. PROCEDURE: Informed consent was obtained after thorough discussion of the potential risks and benefits. All questions were addressed. Maximal Sterile Barrier Technique was utilized including caps, mask, sterile gowns, sterile gloves, sterile drape, hand hygiene and skin antiseptic. A timeout was performed prior to the initiation of the procedure. Right side of the abdomen was prepped and draped in sterile fashion. Ultrasound was used to identify the gallbladder. Skin was anesthetized using 1% lidocaine. Small incision was made. Using ultrasound guidance, 21 gauge needle was directed into the gallbladder using a transhepatic approach. 0.018 wire was advanced in the gallbladder. Transitional dilator set was placed. Small amount of contrast was injected to confirm placement. J wire was placed and the tract was dilated to accommodate a 10 [place] multipurpose drain. 10 French drain was reconstituted in the gallbladder. 60 mL of cloudy orange fluid was removed. Gallbladder was decompressed at end of the procedure. Drain was flushed with saline and attached to a gravity bag. Drain was sutured to skin. FINDINGS: Gallbladder was markedly abnormal on ultrasound with multiple stones and asymmetric wall thickening. Drain was successfully placed using a transhepatic approach. Cholangiogram demonstrated multiple stones but there was filling of the common bile duct and duodenum. 60 mL of orange colored fluid was removed. Fluid was sent for culture. IMPRESSION: 1. Successful percutaneous cholecystostomy tube placement. 2. Cholelithiasis.  Cystic duct and common bile duct are patent. Electronically Signed: By: Richarda Overlie M.D. On: 10/21/2022 17:06   CT ABDOMEN PELVIS  W CONTRAST  Result Date: 10/19/2022 CLINICAL DATA:  Abdominal pain EXAM: CT ABDOMEN AND PELVIS WITH  CONTRAST TECHNIQUE: Multidetector CT imaging of the abdomen and pelvis was performed using the standard protocol following bolus administration of intravenous contrast. RADIATION DOSE REDUCTION: This exam was performed according to the departmental dose-optimization program which includes automated exposure control, adjustment of the mA and/or kV according to patient size and/or use of iterative reconstruction technique. CONTRAST:  70mL OMNIPAQUE IOHEXOL 350 MG/ML SOLN COMPARISON:  None Available. FINDINGS: Lower chest: Heart is slightly enlarged. Significant calcifications along the mitral valve annulus. There is some linear opacity at the bases likely scar or atelectasis. 6 mm subpleural nodule left lower lobe on series 4, image 3. Small hiatal hernia. Hepatobiliary: Gallbladder is dilated with wall thickening, stones and stranding. Please correlate for acute cholecystitis. There may be a stone towards the neck as well. Underlying fatty liver infiltration. Patent portal vein. Pancreas: Severe fatty atrophy of the pancreas. There is a cystic lesion towards the tail of the pancreas measuring 2.7 by 2.5 cm. Series 3, image 27. Spleen: Normal in size without focal abnormality. Adrenals/Urinary Tract: The adrenal glands are preserved. Bilateral small renal stones. These punctate overall except for 4 mm lower pole right-sided stone. There is cyst along the lower pole left kidney measuring 2.2 cm with Hounsfield unit of 11. Bosniak 1 lesion. Tiny focus on the right, Bosniak 2 lesion. Again no specific imaging follow-up of the cysts. The ureters have normal course and caliber extending down to the bladder. Bladder is underdistended. Stomach/Bowel: There is some fluid in the stomach. Stomach is nondilated. The small bowel is nondilated. Large bowel has a normal course and caliber. Normal appendix. Vascular/Lymphatic: Normal caliber aorta and IVC. Mild vascular calcifications. No specific abnormal lymph node enlargement  identified in the abdomen and pelvis. Replaced left hepatic artery from the left gastric. Reproductive: Status post hysterectomy. No adnexal masses. Other: Small amount of free fluid dependently in the pelvis, nonspecific. No free air. Fat containing umbilical hernia. Musculoskeletal: Scattered degenerative changes along the spine with levoconvex curvature of the lower lumbar spine centered at L4. There is also multilevel trace retrolisthesis and multilevel stenosis. IMPRESSION: Abnormal gallbladder with dilatation and stones. There is significant wall thickening, edema and adjacent stranding consistent with a acute cholecystitis. Possible stone in the neck of the gallbladder as well. 2.7 cm cystic lesion along the tail of the pancreas. In the absence of prior would recommend follow up imaging in 6 months. Electronically Signed   By: Karen Kays M.D.   On: 10/19/2022 12:35   CT Head Wo Contrast  Result Date: 10/19/2022 CLINICAL DATA:  Headache, new onset. EXAM: CT HEAD WITHOUT CONTRAST TECHNIQUE: Contiguous axial images were obtained from the base of the skull through the vertex without intravenous contrast. RADIATION DOSE REDUCTION: This exam was performed according to the departmental dose-optimization program which includes automated exposure control, adjustment of the mA and/or kV according to patient size and/or use of iterative reconstruction technique. COMPARISON:  Head CT 06/28/2021.  MRI brain 06/28/2021. FINDINGS: Brain: No acute hemorrhage. Unchanged mild chronic small-vessel disease with old lacunar infarcts in the bilateral caudate heads. Cortical gray-white differentiation is otherwise preserved. Prominence of the ventricles and sulci within expected range for age. No hydrocephalus or extra-axial collection. No mass effect or midline shift. Vascular: No hyperdense vessel or unexpected calcification. Skull: No calvarial fracture or suspicious bone lesion. Skull base is unremarkable. Sinuses/Orbits:  Unremarkable. Other: None IMPRESSION: 1. No acute intracranial  abnormality. 2. Unchanged mild chronic small-vessel disease. Electronically Signed   By: Orvan Falconer M.D.   On: 10/19/2022 12:31      Subjective: Patient seen and examined at bedside today.  Hemodynamically stable for discharge.  Remains in normal sinus rhythm.  Heart rate well-controlled.  I called and discussed discharge planning with son at bedside  Discharge Exam: Vitals:   10/27/22 0805 10/27/22 0903  BP: (!) 108/58   Pulse:    Resp: 17   Temp: 97.6 F (36.4 C)   SpO2:  97%   Vitals:   10/26/22 2339 10/27/22 0233 10/27/22 0805 10/27/22 0903  BP: (!) 112/55 (!) 93/57 (!) 108/58   Pulse: 65 60    Resp: 18 18 17    Temp: 98.1 F (36.7 C) 97.8 F (36.6 C) 97.6 F (36.4 C)   TempSrc: Oral Oral Oral   SpO2: 96% 98%  97%  Weight:      Height:        General: Pt is alert, awake, not in acute distress Cardiovascular: RRR, S1/S2 +, no rubs, no gallops Respiratory: CTA bilaterally, no wheezing, no rhonchi Abdominal: Soft, NT, ND, bowel sounds + Extremities: no edema, no cyanosis    The results of significant diagnostics from this hospitalization (including imaging, microbiology, ancillary and laboratory) are listed below for reference.     Microbiology: Recent Results (from the past 240 hour(s))  Blood culture (routine x 2)     Status: None   Collection Time: 10/19/22  1:01 PM   Specimen: BLOOD  Result Value Ref Range Status   Specimen Description BLOOD SITE NOT SPECIFIED  Final   Special Requests   Final    BOTTLES DRAWN AEROBIC AND ANAEROBIC Blood Culture adequate volume   Culture   Final    NO GROWTH 5 DAYS Performed at [facility], [address] [place], [place] [address]    Report Status 10/24/2022 FINAL  Final  Blood culture (routine x 2)     Status: None   Collection Time: 10/19/22  1:06 PM   Specimen: BLOOD LEFT ARM  Result Value Ref Range Status   Specimen Description BLOOD LEFT  ARM  Final   Special Requests   Final    BOTTLES DRAWN AEROBIC AND ANAEROBIC Blood Culture adequate volume   Culture   Final    NO GROWTH 5 DAYS Performed at [facility], [address] [place], [place] [address]    Report Status 10/24/2022 FINAL  Final  C Difficile Quick Screen w PCR reflex     Status: None   Collection Time: 10/21/22  7:10 AM   Specimen: STOOL  Result Value Ref Range Status   C Diff antigen NEGATIVE NEGATIVE Final   C Diff toxin NEGATIVE NEGATIVE Final   C Diff interpretation No C. difficile detected.  Final    Comment: Performed at [facility], [address] [place], [place] [address]  Aerobic/Anaerobic Culture w Gram Stain (surgical/deep wound)     Status: None (Preliminary result)   Collection Time: 10/21/22  4:10 PM   Specimen: BILE  Result Value Ref Range Status   Specimen Description BILE  Final   Special Requests FROM GALLBLADDER DRAIN  Final   Gram Stain   Final    ABUNDANT WBC PRESENT, PREDOMINANTLY PMN FEW GRAM POSITIVE COCCI IN CHAINS FEW GRAM NEGATIVE RODS    Culture   Final    FEW HAFNIA ALVEI CULTURE REINCUBATED FOR BETTER GROWTH NO  ANAEROBES ISOLATED Performed at [facility], [address], [place], [place] [address]    Report Status PENDING  Incomplete   Organism ID, Bacteria HAFNIA ALVEI  Final      Susceptibility   Hafnia alvei - MIC*    AMPICILLIN RESISTANT Resistant     CEFTAZIDIME <=1 SENSITIVE Sensitive     CEFTRIAXONE <=0.25 SENSITIVE Sensitive     CIPROFLOXACIN <=0.25 SENSITIVE Sensitive     GENTAMICIN <=1 SENSITIVE Sensitive     IMIPENEM <=0.25 SENSITIVE Sensitive     TRIMETH/SULFA <=20 SENSITIVE Sensitive     AMPICILLIN/SULBACTAM RESISTANT Resistant     PIP/TAZO <=4 SENSITIVE Sensitive     * FEW HAFNIA ALVEI     Labs: BNP (last 3 results) No results for input(s): "BNP" in the last 8760 hours. Basic Metabolic Panel: Recent Labs  Lab 10/20/22 1143 10/20/22 2145 10/21/22 0640 10/22/22 0207  10/23/22 0102 10/24/22 0127 10/26/22 1133  NA  --   --  136 131* 136 138 139  K  --   --  3.3* 3.2* 3.3* 3.6 3.9  CL  --   --  101 96* 105 108 105  CO2  --   --  23 23 22 23 24   GLUCOSE  --   --  113* 443* 108* 110* 104*  BUN  --   --  10 7* 10 9 13   CREATININE  --   --  0.97 1.03* 0.93 0.84 0.93  CALCIUM  --   --  8.2* 7.3* 8.3* 8.3* 8.8*  MG 1.8 1.8  --   --   --   --   --    Liver Function Tests: Recent Labs  Lab 10/22/22 0207  AST 31  ALT 21  ALKPHOS 44  BILITOT 0.3  PROT 4.8*  ALBUMIN 1.9*   No results for input(s): "LIPASE", "AMYLASE" in the last 168 hours. No results for input(s): "AMMONIA" in the last 168 hours. CBC: Recent Labs  Lab 10/20/22 1143 10/21/22 0207 10/21/22 0640 10/22/22 0207 10/26/22 1133  WBC 12.4* 8.8 11.1* 10.4 12.4*  HGB 11.8* 9.7* 11.5* 11.0* 12.2  HCT 36.8 31.8* 36.5 35.1* 38.6  MCV 92.7 95.2 90.8 91.2 89.8  PLT 200 156 192 201 475*   Cardiac Enzymes: No results for input(s): "CKTOTAL", "CKMB", "CKMBINDEX", "TROPONINI" in the last 168 hours. BNP: Invalid input(s): "POCBNP" CBG: Recent Labs  Lab 10/21/22 0337 10/22/22 1010 10/26/22 0555  GLUCAP 105* 115* 110*   D-Dimer No results for input(s): "DDIMER" in the last 72 hours. Hgb A1c No results for input(s): "HGBA1C" in the last 72 hours. Lipid Profile No results for input(s): "CHOL", "HDL", "LDLCALC", "TRIG", "CHOLHDL", "LDLDIRECT" in the last 72 hours. Thyroid function studies No results for input(s): "TSH", "T4TOTAL", "T3FREE", "THYROIDAB" in the last 72 hours.  Invalid input(s): "FREET3" Anemia work up No results for input(s): "VITAMINB12", "FOLATE", "FERRITIN", "TIBC", "IRON", "RETICCTPCT" in the last 72 hours. Urinalysis    Component Value Date/Time   COLORURINE YELLOW 10/19/2022 1345   APPEARANCEUR HAZY (A) 10/19/2022 1345   LABSPEC >1.046 (H) 10/19/2022 1345   PHURINE 6.0 10/19/2022 1345   GLUCOSEU NEGATIVE 10/19/2022 1345   GLUCOSEU NEGATIVE 07/28/2020 1042    HGBUR MODERATE (A) 10/19/2022 1345   HGBUR negative 09/18/2009 0846   BILIRUBINUR NEGATIVE 10/19/2022 1345   BILIRUBINUR Negative 10/05/2022 1057   KETONESUR 20 (A) 10/19/2022 1345   PROTEINUR 100 (A) 10/19/2022 1345   UROBILINOGEN 1.0 10/05/2022 1057   UROBILINOGEN 0.2 07/28/2020  1042   NITRITE NEGATIVE 10/19/2022 1345   LEUKOCYTESUR LARGE (A) 10/19/2022 1345   Sepsis Labs Recent Labs  Lab 10/21/22 0207 10/21/22 0640 10/22/22 0207 10/26/22 1133  WBC 8.8 11.1* 10.4 12.4*   Microbiology Recent Results (from the past 240 hour(s))  Blood culture (routine x 2)     Status: None   Collection Time: 10/19/22  1:01 PM   Specimen: BLOOD  Result Value Ref Range Status   Specimen Description BLOOD SITE NOT SPECIFIED  Final   Special Requests   Final    BOTTLES DRAWN AEROBIC AND ANAEROBIC Blood Culture adequate volume   Culture   Final    NO GROWTH 5 DAYS Performed at [facility], [address] [address] [place], [place] [address]    Report Status 10/24/2022 FINAL  Final  Blood culture (routine x 2)     Status: None   Collection Time: 10/19/22  1:06 PM   Specimen: BLOOD LEFT ARM  Result Value Ref Range Status   Specimen Description BLOOD LEFT ARM  Final   Special Requests   Final    BOTTLES DRAWN AEROBIC AND ANAEROBIC Blood Culture adequate volume   Culture   Final    NO GROWTH 5 DAYS Performed at [facility], [address], [place], [place] [address]    Report Status 10/24/2022 FINAL  Final  C Difficile Quick Screen w PCR reflex     Status: None   Collection Time: 10/21/22  7:10 AM   Specimen: STOOL  Result Value Ref Range Status   C Diff antigen NEGATIVE NEGATIVE Final   C Diff toxin NEGATIVE NEGATIVE Final   C Diff interpretation No C. difficile detected.  Final    Comment: Performed at [facility], [address], [place], [place] [address]  Aerobic/Anaerobic Culture w Gram Stain (surgical/deep wound)     Status: None (Preliminary result)   Collection Time:  10/21/22  4:10 PM   Specimen: BILE  Result Value Ref Range Status   Specimen Description BILE  Final   Special Requests FROM GALLBLADDER DRAIN  Final   Gram Stain   Final    ABUNDANT WBC PRESENT, PREDOMINANTLY PMN FEW GRAM POSITIVE COCCI IN CHAINS FEW GRAM NEGATIVE RODS    Culture   Final    FEW HAFNIA ALVEI CULTURE REINCUBATED FOR BETTER GROWTH NO ANAEROBES ISOLATED Performed at [facility], [address] [place], [place] [address]    Report Status PENDING  Incomplete   Organism ID, Bacteria HAFNIA ALVEI  Final      Susceptibility   Hafnia alvei - MIC*    AMPICILLIN RESISTANT Resistant     CEFTAZIDIME <=1 SENSITIVE Sensitive     CEFTRIAXONE <=0.25 SENSITIVE Sensitive     CIPROFLOXACIN <=0.25 SENSITIVE Sensitive     GENTAMICIN <=1 SENSITIVE Sensitive     IMIPENEM <=0.25 SENSITIVE Sensitive     TRIMETH/SULFA <=20 SENSITIVE Sensitive     AMPICILLIN/SULBACTAM RESISTANT Resistant     PIP/TAZO <=4 SENSITIVE Sensitive     * FEW HAFNIA ALVEI    Please note: You were cared for by a hospitalist during your hospital stay. Once you are discharged, your primary care physician will handle any further medical issues. Please note that NO REFILLS for any discharge medications will be authorized once you are discharged, as it is imperative that you return to your primary care physician (or establish a relationship with a primary care physician if you do not have one)  for your post hospital discharge needs so that they can reassess your need for medications and monitor your lab values.    Time coordinating discharge: 40 minutes  SIGNED:   Burnadette Pop, MD  [facility] 10/27/2022, 10:13 AM Pager [phone]  If 7PM-7AM, please contact night-coverage [email or web address] Password [email or web address]

## 2022-10-27 NOTE — Progress Notes (Signed)
Cardiology Progress Note  Patient ID: Laura Mendez MRN: [id number] DOB: 1940-07-27 Date of Encounter: 10/27/2022  Primary Cardiologist: Donato Schultz, MD  Subjective   Chief Complaint: Weakness   HPI: Maintaining sinus rhythm.  Reports weakness and fatigue.  I have stopped her beta-blocker.  ROS:  All other ROS reviewed and negative. Pertinent positives noted in the HPI.     Inpatient Medications  Scheduled Meds:  amiodarone  400 mg Oral BID   Followed by   Laura Mendez ON 11/02/2022] amiodarone  200 mg Oral Daily   apixaban  5 mg Oral BID   Chlorhexidine Gluconate Cloth  6 each Topical Daily   mometasone-formoterol  2 puff Inhalation BID   sodium chloride flush  10-40 mL Intracatheter Q12H   sodium chloride flush  3 mL Intravenous Q12H   sodium chloride flush  5 mL Intracatheter Q8H   sulfamethoxazole-trimethoprim  1 tablet Oral Q12H   Continuous Infusions:  PRN Meds: acetaminophen **OR** acetaminophen, albuterol, calcium carbonate, hydrALAZINE, loperamide, ondansetron **OR** ondansetron (ZOFRAN) IV, sodium chloride flush   Vital Signs   Vitals:   10/26/22 2105 10/26/22 2339 10/27/22 0233 10/27/22 0805  BP:  (!) 112/55 (!) 93/57 (!) 108/58  Pulse:  65 60   Resp:  18 18 17   Temp:  98.1 F (36.7 C) 97.8 F (36.6 C) 97.6 F (36.4 C)  TempSrc:  Oral Oral Oral  SpO2: 95% 96% 98%   Weight:      Height:        Intake/Output Summary (Last 24 hours) at 10/27/2022 0927 Last data filed at 10/26/2022 2000 Gross per 24 hour  Intake 240 ml  Output --  Net 240 ml      10/19/2022    4:00 PM 10/05/2022   10:53 AM 07/29/2022    9:34 AM  Last 3 Weights  Weight (lbs) 198 lb 6.6 oz 212 lb 203 lb  Weight (kg) 90 kg 96.163 kg 92.08 kg      Telemetry  Overnight telemetry shows sinus rhythm 60 to 70 bpm with PACs, which I personally reviewed.    Physical Exam   Vitals:   10/26/22 2105 10/26/22 2339 10/27/22 0233 10/27/22 0805  BP:  (!) 112/55 (!) 93/57 (!) 108/58  Pulse:   65 60   Resp:  18 18 17   Temp:  98.1 F (36.7 C) 97.8 F (36.6 C) 97.6 F (36.4 C)  TempSrc:  Oral Oral Oral  SpO2: 95% 96% 98%   Weight:      Height:        Intake/Output Summary (Last 24 hours) at 10/27/2022 0927 Last data filed at 10/26/2022 2000 Gross per 24 hour  Intake 240 ml  Output --  Net 240 ml       10/19/2022    4:00 PM 10/05/2022   10:53 AM 07/29/2022    9:34 AM  Last 3 Weights  Weight (lbs) 198 lb 6.6 oz 212 lb 203 lb  Weight (kg) 90 kg 96.163 kg 92.08 kg    Body mass index is 27.67 kg/m.  General: Well nourished, well developed, in no acute distress Head: Atraumatic, normal size  Eyes: PEERLA, EOMI  Neck: Supple, no JVD Endocrine: No thryomegaly Cardiac: Normal S1, S2; RRR; no murmurs, rubs, or gallops Lungs: Clear to auscultation bilaterally, no wheezing, rhonchi or rales  Abd: Soft, nontender, no hepatomegaly  Ext: No edema, pulses 2+ Musculoskeletal: No deformities, BUE and BLE strength normal and equal Skin: Warm and dry, no rashes  Neuro: Alert and oriented to person, place, time, and situation, CNII-XII grossly intact, no focal deficits  Psych: Normal mood and affect   Labs  High Sensitivity Troponin:  No results for input(s): "TROPONINIHS" in the last 720 hours.   Cardiac EnzymesNo results for input(s): "TROPONINI" in the last 168 hours. No results for input(s): "TROPIPOC" in the last 168 hours.  Chemistry Recent Labs  Lab 10/22/22 0207 10/23/22 0102 10/24/22 0127 10/26/22 1133  NA 131* 136 138 139  K 3.2* 3.3* 3.6 3.9  CL 96* 105 108 105  CO2 23 22 23 24   GLUCOSE 443* 108* 110* 104*  BUN 7* 10 9 13   CREATININE 1.03* 0.93 0.84 0.93  CALCIUM 7.3* 8.3* 8.3* 8.8*  PROT 4.8*  --   --   --   ALBUMIN 1.9*  --   --   --   AST 31  --   --   --   ALT 21  --   --   --   ALKPHOS 44  --   --   --   BILITOT 0.3  --   --   --   GFRNONAA 54* >60 >60 >60  ANIONGAP 12 9 7 10     Hematology Recent Labs  Lab 10/21/22 0640 10/22/22 0207  10/26/22 1133  WBC 11.1* 10.4 12.4*  RBC 4.02 3.85* 4.30  HGB 11.5* 11.0* 12.2  HCT 36.5 35.1* 38.6  MCV 90.8 91.2 89.8  MCH 28.6 28.6 28.4  MCHC 31.5 31.3 31.6  RDW 14.3 14.4 14.9  PLT 192 201 475*   BNPNo results for input(s): "BNP", "PROBNP" in the last 168 hours.  DDimer No results for input(s): "DDIMER" in the last 168 hours.   Radiology  ECHOCARDIOGRAM LIMITED  Result Date: 10/25/2022    ECHOCARDIOGRAM LIMITED REPORT   Patient Name:   Laura Mendez Date of Exam: 10/25/2022 Medical Rec #:  [id number]              Height:       71.0 in Accession #:    [id number]             Weight:       198.4 lb Date of Birth:  April 01, 1941               BSA:          2.101 m Patient Age:    82 years               BP:           108/67 mmHg Patient Gender: F                      HR:           115 bpm. Exam Location:  Inpatient Procedure: Limited Echo, Cardiac Doppler and Color Doppler Indications:    A-Fib  History:        Patient has prior history of Echocardiogram examinations, most                 recent 11/23/2021. Abnormal ECG, Arrythmias:Atrial Fibrillation;                 Risk Factors:Hypertension.  Sonographer:    Darlys Gales Referring Phys: [id number] [place]shaw THOMAS O'NEAL IMPRESSIONS  1. Left ventricular ejection fraction, by estimation, is 60 to 65%. The left ventricle has normal function. The left ventricle has no regional wall motion abnormalities. There is mild concentric left ventricular  hypertrophy. Left ventricular diastolic parameters are indeterminate.  2. Left atrial size was moderately dilated.  3. The mitral valve is degenerative. Trivial mitral valve regurgitation. Probably moderate mitral stenosis. The mean mitral valve gradient is 6.0 mmHg. Severe mitral annular calcification. Full doppler interrogation of the mitral valve was not done.  4. The aortic valve is tricuspid. No doppler interrogation of aortic valve.  5. Tricuspid regurgitation signal is inadequate for assessing PA pressure.   6. IVC not visualized.  7. The patient was in atrial fibrillation.  8. Limited echo with minimal doppler, poor images. FINDINGS  Left Ventricle: Left ventricular ejection fraction, by estimation, is 60 to 65%. The left ventricle has normal function. The left ventricle has no regional wall motion abnormalities. There is mild concentric left ventricular hypertrophy. Left ventricular diastolic parameters are indeterminate. Right Ventricle: Tricuspid regurgitation signal is inadequate for assessing PA pressure. Left Atrium: Left atrial size was moderately dilated. Right Atrium: Right atrial size was normal in size. Mitral Valve: The mitral valve is degenerative in appearance. There is mild calcification of the mitral valve leaflet(s). Severe mitral annular calcification. Trivial mitral valve regurgitation. Moderate mitral valve stenosis. MV peak gradient, 13.0 mmHg. The mean mitral valve gradient is 6.0 mmHg. Aortic Valve: The aortic valve is tricuspid. Venous: The inferior vena cava was not well visualized. IAS/Shunts: No atrial level shunt detected by color flow Doppler. LEFT VENTRICLE PLAX 2D LVOT diam:     1.80 cm LVOT Area:     2.54 cm  LEFT ATRIUM              Index        RIGHT ATRIUM           Index LA Vol (A2C):   117.0 ml 55.68 ml/m  RA Area:     16.00 cm LA Vol (A4C):   81.8 ml  38.93 ml/m  RA Volume:   39.40 ml  18.75 ml/m LA Biplane Vol: 102.0 ml 48.54 ml/m  MITRAL VALVE MV Peak grad: 13.0 mmHg  SHUNTS MV Mean grad: 6.0 mmHg   Systemic Diam: 1.80 cm MV Vmax:      1.80 m/s MV Vmean:     110.0 cm/s Dalton McleanMD Electronically signed by Wilfred Lacy Signature Date/Time: 10/25/2022/3:42:04 PM    Final     Cardiac Studies  TTE 10/25/2022  1. Left ventricular ejection fraction, by estimation, is 60 to 65%. The  left ventricle has normal function. The left ventricle has no regional  wall motion abnormalities. There is mild concentric left ventricular  hypertrophy. Left ventricular diastolic   parameters are indeterminate.   2. Left atrial size was moderately dilated.   3. The mitral valve is degenerative. Trivial mitral valve regurgitation.  Probably moderate mitral stenosis. The mean mitral valve gradient is 6.0  mmHg. Severe mitral annular calcification. Full doppler interrogation of  the mitral valve was not done.   4. The aortic valve is tricuspid. No doppler interrogation of aortic  valve.   5. Tricuspid regurgitation signal is inadequate for assessing PA  pressure.   6. IVC not visualized.   7. The patient was in atrial fibrillation.   8. Limited echo with minimal doppler, poor images.   Patient Profile  Nyoka Clippard is a 82 y.o. female with paroxysmal atrial fibrillation, stroke, HFpEF, breast cancer who was admitted on 10/19/2022 for acute cholecystitis status post percutaneous cholecystostomy tube placement. Course complicated by A-fib with RVR.   Assessment & Plan   # A-fib  with RVR -Driven by acute cholecystitis.  Status post percutaneous cholecystostomy tube. -Has converted back to sinus rhythm on amiodarone. -Plan for rhythm control strategy in the setting of acute illness.  400 mg amiodarone twice daily for 7 days followed by 200 mg daily for 21 days and stop. -I have stopped her metoprolol.  She reports weakness and low energy with this in the past.  We will hold this. -Continue Eliquis. -Echo shows normal LV and RV function.  # LE Edema  # HFpEF -Good response with 40 mg of Lasix yesterday.  Can continue this as needed.  [facility] will sign off.   Medication Recommendations: As above Other recommendations (labs, testing, etc): None Follow up as an outpatient: We will arrange outpatient follow-up in 2 to 4 weeks with Dr. Anne Fu  For questions or updates, please contact [facility] Please consult [email or web address] for contact info under        Signed, Gerri Spore T. Flora Lipps, MD, [facility]  [facility]  10/27/2022 9:27 AM

## 2022-10-27 NOTE — Care Management Important Message (Signed)
Important Message  Patient Details  Name: Laura Mendez MRN: [id number] Date of Birth: May 28, 1940   Medicare Important Message Given:  Yes     Renie Ora 10/27/2022, 10:38 AM

## 2022-10-27 NOTE — Progress Notes (Signed)
Per MD order, PICC line removed. Pressure held until hemostatis acquired. Patient instructed to remain on bedrest for 30 minutes post removal. Further instructions included: keep dressing clean, dry, and intact for 24 hours; call MD for signs of infection (pain, swelling, discharge, redness at site; fever). Patient verbalized understanding.

## 2022-10-27 NOTE — Progress Notes (Signed)
D/c tele and IV. Went over AVS with pt and all questions were addressed.   Haydee Jabbour S Jyden Kromer, RN  

## 2022-10-27 NOTE — Progress Notes (Signed)
Education about how to flush and emty drainage gave to pt.  Lawson Radar, RN

## 2022-10-27 NOTE — TOC Transition Note (Signed)
Transition of Care (TOC) - CM/SW Discharge Note Donn Pierini RN, BSN Transitions of Care Unit 4E- RN Case Manager See Treatment Team for direct phone #   Patient Details  Name: Laura Mendez MRN: [id number] Date of Birth: May 02, 1941  Transition of Care [facility]) CM/SW Contact:  Darrold Span, RN Phone Number: 10/27/2022, 3:08 PM   Clinical Narrative:    Pt stable for transition home, orders have been placed for HHRN/PT/OT needs.  Pt will go home with biliary drain- bedside RN has provided education on drain care.   CM spoke with pt at bedside- discussed [facility] services and list provided for choice Per CMS guidelines from [employer]pl website with star ratings (copy placed in shadow chart)- pt voiced she does not have a preference on agency only one that will work with her insurance and has good star ratings.   Address, phone # and PCP all confirmed, pt states her family will transport home.   Reviewed DME- pt voiced she has DME RW and cane at home- no new DME needs noted.  Bedside RN has educated on drain care- pt voiced she feels comfortable with drain needs and understands she will have drain 4-6 wks.   Call made to [facility] liaison for [facility] needs- referral pending.   1500- notified by [facility] liaison that [facility] referral has been accepted for RN/PT/OT needs and they will plan to do start of care visit on Friday- 6/7.   No further TOC needs noted.    Final next level of care: Home w Home Health Services Barriers to Discharge: Barriers Resolved   Patient Goals and CMS Choice CMS Medicare.gov Compare Post Acute Care list provided to:: Patient Choice offered to / list presented to : Patient  Discharge Placement                 [facility]    Discharge Plan and Services Additional resources added to the After Visit Summary for     Discharge Planning Services: CM Consult Post Acute Care Choice: Home Health          DME Arranged: N/A DME Agency: NA        HH Arranged: RN, PT, OT HH Agency: [facility] Date [facility] Agency Contacted: 10/27/22 Time HH Agency Contacted: 1300 Representative spoke with at [facility] Agency: Angie  Social Determinants of Health (SDOH) Interventions SDOH Screenings   Food Insecurity: No Food Insecurity (10/19/2022)  Housing: Low Risk  (10/19/2022)  Transportation Needs: No Transportation Needs (10/19/2022)  Utilities: Not At Risk (10/19/2022)  Alcohol Screen: Low Risk  (11/16/2021)  Depression (PHQ2-9): Low Risk  (12/08/2021)  Financial Resource Strain: Low Risk  (11/16/2021)  Physical Activity: Insufficiently Active (11/16/2021)  Social Connections: Moderately Isolated (11/16/2021)  Stress: No Stress Concern Present (11/16/2021)  Tobacco Use: Medium Risk (10/21/2022)     Readmission Risk Interventions    10/27/2022    1:53 PM  Readmission Risk Prevention Plan  Post Dischage Appt Complete  Medication Screening Complete  Transportation Screening Complete

## 2022-10-27 NOTE — Progress Notes (Signed)
Physical Therapy Treatment Patient Details Name: Laura Mendez MRN: [id number] DOB: 09-03-1940 Today's Date: 10/27/2022   History of Present Illness 82 y.o. female with medical history significant of breast CA, chronic diastolic CHF, afib, and CVA presenting with 3-4 days of diarrhea. CT with acute cholecystitis.    PT Comments    Pt received sitting EOB, agreeable to therapy session and with good participation in transfer/gait training and fair tolerance for standing tasks. Pt standing tolerance limited due to c/o fatigue and pt wanting to eat lunch prior to DC. Pt performed functional mobility tasks with up to minA, but mostly min guard when using RW. Pt needs safety cues for improved AD management during gait trial and encouraged her to only use RW initially until HHPT clears her for use of cane when mobilizing on her own as RW is a more stable/safe device for her. No acute s/sx distress but pt does report some LUE and R foot edema, reviewed UE/LE exercises and positioning for edema mgmt. Pt continues to benefit from PT services to progress toward functional mobility goals.    Recommendations for follow up therapy are one component of a multi-disciplinary discharge planning process, led by the attending physician.  Recommendations may be updated based on patient status, additional functional criteria and insurance authorization.  Follow Up Recommendations       Assistance Recommended at Discharge Intermittent Supervision/Assistance  Patient can return home with the following A little help with walking and/or transfers;A little help with bathing/dressing/bathroom;Assistance with cooking/housework;Direct supervision/assist for medications management;Direct supervision/assist for financial management;Assist for transportation;Help with stairs or ramp for entrance   Equipment Recommendations  None recommended by PT    Recommendations for Other Services       Precautions /  Restrictions Precautions Precautions: Fall Precaution Comments: watch HR/hx CVA R weakness, hx br ca pt c/o LUE edema Restrictions Weight Bearing Restrictions: No     Mobility  Bed Mobility Overal bed mobility: Needs Assistance             General bed mobility comments: pt already sitting EOB upon arrival    Transfers Overall transfer level: Needs assistance Equipment used: Rolling walker (2 wheels), Straight cane, 1 person hand held assist Transfers: Sit to/from Stand, Bed to chair/wheelchair/BSC Sit to Stand: Min guard, Min assist   Step pivot transfers: Min guard       General transfer comment: min assist with RUE HHA for initial STS from EOB and min guard for step pivot from EOB>BSC. Pt then stood from [facility] with verbal cues for hand placement and body mechanics using cane. Pt switched from cane to RW midway through gait trial and pt needed cues for safe UE placement with stand>sit to chair.    Ambulation/Gait Ambulation/Gait assistance: Min guard Gait Distance (Feet): 30 Feet Assistive device: Rolling walker (2 wheels), Straight cane Gait Pattern/deviations: Step-through pattern, Decreased step length - right, Decreased step length - left, Trunk flexed       General Gait Details: No acute s/sx distress or dyspnea, HR WFL prior to mobility and no alarm during gait trial (PTA assisting pt and explaining to observer student who was present so not able to monitor HR closely for entire trial). Decreased bil foot clearance and decreased R step length (hx CVA), pt steadier using RW and after ~56ft switched from cane to RW and used that for final 46ft, pt defers longer distance in hallway due to eagerness to eat her lunch prior to impending DC. Pt needs cues  for improved proximity to RW frequently, and verbalized agreement to use RW at home until [facility] services recommend she return to using [facility].   Stairs Stairs:  (pt states she has a chair lift and won't need to perform stairs  upon DC so defers)           Wheelchair Mobility    Modified Rankin (Stroke Patients Only)       Balance Overall balance assessment: Needs assistance Sitting-balance support: Feet supported Sitting balance-Leahy Scale: Fair     Standing balance support: Bilateral upper extremity supported, During functional activity, Reliant on assistive device for balance, Single extremity supported Standing balance-Leahy Scale: Poor Standing balance comment: dependent on BUE support for dynamic standing tasks; single UE support for static tasks (peri care after toileting)                            Cognition Arousal/Alertness: Awake/alert Behavior During Therapy: WFL for tasks assessed/performed Overall Cognitive Status: Impaired/Different from baseline Area of Impairment: Safety/judgement, Memory                     Memory: Decreased short-term memory   Safety/Judgement: Decreased awareness of safety     General Comments: Pt needs reminders often for safe orientation within RW, reviewed use of RW rather than cane for now.        Exercises Other Exercises Other Exercises: reviewed LUE AROM: gross grasp/finger extension and wrist flex/ext x10 reps hourly along with positioning for edema relief as she c/o LUE edema Other Exercises: supine BLE AROM: ankle pumps, quad sets x 5 reps ea for teachback, pt c/o RLE edema encouraged BLE elevation when resting    General Comments General comments (skin integrity, edema, etc.): HR WFL per tele monitor, no acute s/sx distress with transfers/gait.      Pertinent Vitals/Pain Pain Assessment Pain Assessment: Faces Faces Pain Scale: Hurts a little bit Pain Location: not localized Pain Descriptors / Indicators: Grimacing Pain Intervention(s): Monitored during session, Repositioned    Home Living                          Prior Function            PT Goals (current goals can now be found in the care plan  section) Acute Rehab PT Goals Patient Stated Goal: get back to doing things at home PT Goal Formulation: With patient Time For Goal Achievement: 11/05/22 Progress towards PT goals: Progressing toward goals    Frequency    Min 1X/week      PT Plan Current plan remains appropriate       AM-PAC PT "6 Clicks" Mobility   Outcome Measure  Help needed turning from your back to your side while in a flat bed without using bedrails?: None Help needed moving from lying on your back to sitting on the side of a flat bed without using bedrails?: A Little Help needed moving to and from a bed to a chair (including a wheelchair)?: A Little Help needed standing up from a chair using your arms (e.g., wheelchair or bedside chair)?: A Little Help needed to walk in hospital room?: A Little Help needed climbing 3-5 steps with a railing? : A Lot 6 Click Score: 18    End of Session Equipment Utilized During Treatment: Gait belt Activity Tolerance: Patient tolerated treatment well Patient left: in chair;with call bell/phone within reach;with chair  alarm set;Other (comment) (set up to eat her lunch, case mgr entering room to speak with her.) Nurse Communication: Mobility status PT Visit Diagnosis: Other abnormalities of gait and mobility (R26.89);Muscle weakness (generalized) (M62.81);Difficulty in walking, not elsewhere classified (R26.2)     Time: 1610-9604 PT Time Calculation (min) (ACUTE ONLY): 11 min  Charges:  $Gait Training: 8-22 mins                     Bonnye Halle P., PTA Acute Rehabilitation Services Secure Chat Preferred 9a-5:30pm Office: [phone]    Dorathy Kinsman [facility] 10/27/2022, 12:17 PM

## 2022-10-28 ENCOUNTER — Telehealth: Payer: Self-pay | Admitting: Internal Medicine

## 2022-10-28 ENCOUNTER — Encounter: Payer: Self-pay | Admitting: *Deleted

## 2022-10-28 ENCOUNTER — Other Ambulatory Visit: Payer: Self-pay | Admitting: Internal Medicine

## 2022-10-28 ENCOUNTER — Telehealth: Payer: Self-pay | Admitting: *Deleted

## 2022-10-28 ENCOUNTER — Encounter: Payer: Self-pay | Admitting: Internal Medicine

## 2022-10-28 NOTE — Transitions of Care (Post Inpatient/ED Visit) (Signed)
10/28/2022  Name: Laura Mendez MRN: [id number] DOB: May 29, 1940  Today's TOC FU Call Status: Today's TOC FU Call Status:: Successful TOC FU Call Competed TOC FU Call Complete Date: 10/28/22  Transition Care Management Follow-up Telephone Call Date of Discharge: 10/27/22 Discharge Facility: [facility]) Type of Discharge: Inpatient Admission Primary Inpatient Discharge Diagnosis:: acute cholecystitis with placement of biliary drain; A-Fib with RVR How have you been since you were released from the hospital?: Same (per son Al: "She is doing okay for the most part.  She feels very weak after the hospital visit and I think the amiodarone is making her feel bad.  I sent Dr. Okey Dupre a message- we need  the saline flushes prescribed to drain her drain and protonix Rx") Any questions or concerns?: Yes Patient Questions/Concerns:: Per patient's caregiver, he was told at outpatient pharmacy saline flushes for biliary drain requires MD Rx; he also needs new Rx for Protonix- confirms he has messaged PCP about both needs through MyChart Patient Questions/Concerns Addressed: Other: (encouraged patient's caregiver to await response from Dr. Okey Dupre about needed Rx's; also encouraged him to consider reaching out to surgical provider for any drain needs; sent message to PCP as FYI/ back up per pt. son request)  Items Reviewed: Did you receive and understand the discharge instructions provided?: Yes (thoroughly reviewed with patient who verbalizes good understanding of same) Medications obtained,verified, and reconciled?: Yes (Medications Reviewed) (Full medication reconciliation/ review completed; confirmed patient obtained/ is taking all newly Rx'd medications as instructed; son-manages medications and reports has sent msg to PCP re: questions/ concerns around medications today as above) Any new allergies since your discharge?: No Dietary orders reviewed?: Yes Type of Diet Ordered:: low fat,  conservative Do you have support at home?: Yes (son reports patient currently residing at his home as she recuperates from recent hospitalization/ learns how to care for drain on her own) People in Home: alone Name of Support/Comfort Primary Source: Reports resides alone at baseline/ independent in self-care activities; supportive local son assists as/ if needed/ indicated- patient currently residing with son Al after recent hospitalization  Medications Reviewed Today: Medications Reviewed Today     Reviewed by Michaela Corner, RN (Registered Nurse) on 10/28/22 at 1549  Med List Status: <None>   Medication Order Taking? Sig Documenting Provider Last Dose Status Informant  albuterol (VENTOLIN HFA) 108 (90 Base) MCG/ACT inhaler [id number] Yes INHALE 2 PUFFS BY MOUTH EVERY 4 HOURS AS NEEDED Waymon Budge, MD Taking Active Child, Pharmacy Records  amiodarone (PACERONE) 200 MG tablet [id number] Yes Take 2 tablets (400 mg total) by mouth 2 (two) times daily for 5 days, THEN 1 tablet (200 mg total) daily for 21 days. Stop amiodarone after taking 200mg  daily for 21 days.Burnadette Pop, MD Taking Active   ammonium lactate (LAC-HYDRIN) 12 % cream [id number] Yes Apply 1 Application topically as needed for dry skin. Ellsworth Lennox, PA-C Taking Active Child, Pharmacy Records           Med Note Jonnie Kind Oct 28, 2022  3:34 PM) 10/28/22: Reports during [facility] call has not needed recently  apixaban (ELIQUIS) 5 MG TABS tablet [id number] Yes Take 1 tablet by mouth twice daily Lanier Prude, MD Taking Active Child, Pharmacy Records  DUPIXENT 300 MG/2ML SOPN [id number] No Inject 300 mg into the skin every 14 (fourteen) days.  Patient not taking: Reported on 10/28/2022   [provider] Not Taking Active Pharmacy Records, Child  Med Note Michaela Corner   Thu Oct 28, 2022  3:33 PM) 10/28/22: reports during [facility] call that she has not been taking recently; son reports the "rash went away when  she was in hospital"  fluticasone-salmeterol (ADVAIR) 250-50 MCG/ACT AEPB [id number] Yes INHALE 1 PUFF INTO LUNGS TWICE DAILY  Patient taking differently: Inhale 1 puff into the lungs in the morning and at bedtime.   Waymon Budge, MD Taking Active Child, Pharmacy Records  letrozole [facility]) 2.5 MG tablet [id number] Yes Take 1 tablet by mouth once daily Serena Croissant, MD Taking Active Child, Pharmacy Records  Mepolizumab SOLR 100 mg [id number]   Waymon Budge, MD  Active   Multiple Vitamin (MULTIVITAMIN) tablet [id number] Yes Take 1 tablet by mouth daily. [provider] Taking Active Pharmacy Records, Child  pantoprazole (PROTONIX) 40 MG tablet [id number] Yes Take 1 tablet by mouth once daily [name], MD Taking Active Child, Pharmacy Records           Med Note Jonnie Kind Oct 28, 2022  3:36 PM) 10/28/22: Reports during [facility] call that she is out and needs new Rx-- sent FYI to PCP  sulfamethoxazole-trimethoprim (BACTRIM DS) 800-160 MG tablet [id number] Yes Take 1 tablet by mouth every 12 (twelve) hours for 5 days. Burnadette Pop, MD Taking Active   triamcinolone cream (KENALOG) 0.1 % [id number] Yes Apply 1 Application topically 2 (two) times daily. Ellsworth Lennox, PA-C Taking Active Child, Pharmacy Records           Home Care and Equipment/Supplies: Were Home Health Services Ordered?: Yes Name of Home Health Agency:: [facility]: RN/ PT/ OT Has Agency set up a time to come to your home?: Yes First Home Health Visit Date: 10/29/22 Any new equipment or medical supplies ordered?: No  Functional Questionnaire: Do you need assistance with bathing/showering or dressing?: No Do you need assistance with meal preparation?: Yes Do you need assistance with eating?: No Do you have difficulty maintaining continence: No Do you need assistance with getting out of bed/getting out of a chair/moving?: No Do you have difficulty managing or taking your medications?: Yes  (son manages all aspects of medication administration)  Follow up appointments reviewed: PCP Follow-up appointment confirmed?: Yes Date of PCP follow-up appointment?: 11/11/22 (verified this is recommended time frame for follow up per hospital discharging provider notes 1-2 weeks) Follow-up Provider: PCP Specialist Hospital Follow-up appointment confirmed?: Yes Date of Specialist follow-up appointment?: 11/19/22 (verified this is recommended time frame for follow up per hospital discharging provider notes "end of the month") Follow-Up Specialty Provider:: surgerical provider Do you need transportation to your follow-up appointment?: No Do you understand care options if your condition(s) worsen?: Yes-patient verbalized understanding  SDOH Interventions Today    Flowsheet Row Most Recent Value  SDOH Interventions   Food Insecurity Interventions Intervention Not Indicated  Transportation Interventions Intervention Not Indicated  [normally drives self,  son assisting after recent hospitalization]      TOC Interventions Today    Flowsheet Row Most Recent Value  TOC Interventions   TOC Interventions Discussed/Reviewed TOC Interventions Discussed      Interventions Today    Flowsheet Row Most Recent Value  Chronic Disease   Chronic disease during today's visit Atrial Fibrillation (AFib), Other  [acute cholecystitis with drain placement]  General Interventions   General Interventions Discussed/Reviewed General Interventions Discussed, Doctor Visits, Referral to Nurse, Communication with  Doctor Visits Discussed/Reviewed Specialist, Doctor Visits Discussed, PCP  PCP/Specialist Visits Compliance  with follow-up visit  Communication with PCP/Specialists, RN  Education Interventions   Education Provided Provided Education  Provided Verbal Education On Other  [perc drain management]  Nutrition Interventions   Nutrition Discussed/Reviewed Nutrition Discussed  Pharmacy Interventions    Pharmacy Dicussed/Reviewed Pharmacy Topics Discussed  [Full medication review with updating medication list in EHR per patient report]      Caryl Pina, RN, BSN, CCRN Alumnus RN CM Care Coordination/ Transition of Care- [facility] Care Management [phone]: direct office

## 2022-10-28 NOTE — Telephone Encounter (Signed)
Pt son called stated the pt was just in the hospital and they gave her some saline syringes to flush her drainage and he was inquiring about how can he go about getting  some more  syringes prescribe. Please advise.

## 2022-10-29 ENCOUNTER — Other Ambulatory Visit: Payer: Self-pay | Admitting: Internal Medicine

## 2022-10-29 ENCOUNTER — Other Ambulatory Visit: Payer: Self-pay

## 2022-10-29 ENCOUNTER — Encounter: Payer: Self-pay | Admitting: Cardiology

## 2022-10-29 DIAGNOSIS — K869 Disease of pancreas, unspecified: Secondary | ICD-10-CM | POA: Diagnosis not present

## 2022-10-29 DIAGNOSIS — K8012 Calculus of gallbladder with acute and chronic cholecystitis without obstruction: Secondary | ICD-10-CM | POA: Diagnosis not present

## 2022-10-29 DIAGNOSIS — I5032 Chronic diastolic (congestive) heart failure: Secondary | ICD-10-CM | POA: Diagnosis not present

## 2022-10-29 DIAGNOSIS — C50511 Malignant neoplasm of lower-outer quadrant of right female breast: Secondary | ICD-10-CM | POA: Diagnosis not present

## 2022-10-29 DIAGNOSIS — I480 Paroxysmal atrial fibrillation: Secondary | ICD-10-CM | POA: Diagnosis not present

## 2022-10-29 DIAGNOSIS — J440 Chronic obstructive pulmonary disease with acute lower respiratory infection: Secondary | ICD-10-CM | POA: Diagnosis not present

## 2022-10-29 LAB — AEROBIC/ANAEROBIC CULTURE W GRAM STAIN (SURGICAL/DEEP WOUND)

## 2022-10-29 MED ORDER — PANTOPRAZOLE SODIUM 40 MG PO TBEC: 40.0000 mg | DELAYED_RELEASE_TABLET | Freq: Every day | ORAL | 0 refills | Status: DC

## 2022-11-01 ENCOUNTER — Emergency Department (HOSPITAL_COMMUNITY): Payer: Medicare Other

## 2022-11-01 ENCOUNTER — Other Ambulatory Visit: Payer: Self-pay

## 2022-11-01 ENCOUNTER — Encounter (HOSPITAL_COMMUNITY): Payer: Self-pay

## 2022-11-01 ENCOUNTER — Inpatient Hospital Stay (HOSPITAL_COMMUNITY)
Admission: EM | Admit: 2022-11-01 | Discharge: 2022-11-08 | DRG: 919 | Disposition: A | Discharge status: Home / Self Care | Payer: Medicare Other | Attending: Internal Medicine | Admitting: Internal Medicine

## 2022-11-01 ENCOUNTER — Telehealth: Payer: Self-pay | Admitting: Internal Medicine

## 2022-11-01 DIAGNOSIS — Z881 Allergy status to other antibiotic agents status: Secondary | ICD-10-CM | POA: Diagnosis not present

## 2022-11-01 DIAGNOSIS — I480 Paroxysmal atrial fibrillation: Secondary | ICD-10-CM | POA: Diagnosis present

## 2022-11-01 DIAGNOSIS — Z7901 Long term (current) use of anticoagulants: Secondary | ICD-10-CM | POA: Diagnosis not present

## 2022-11-01 DIAGNOSIS — J9809 Other diseases of bronchus, not elsewhere classified: Secondary | ICD-10-CM | POA: Diagnosis not present

## 2022-11-01 DIAGNOSIS — Z888 Allergy status to other drugs, medicaments and biological substances status: Secondary | ICD-10-CM

## 2022-11-01 DIAGNOSIS — K760 Fatty (change of) liver, not elsewhere classified: Secondary | ICD-10-CM | POA: Diagnosis not present

## 2022-11-01 DIAGNOSIS — N200 Calculus of kidney: Secondary | ICD-10-CM | POA: Diagnosis not present

## 2022-11-01 DIAGNOSIS — Z87891 Personal history of nicotine dependence: Secondary | ICD-10-CM

## 2022-11-01 DIAGNOSIS — Z91048 Other nonmedicinal substance allergy status: Secondary | ICD-10-CM

## 2022-11-01 DIAGNOSIS — D75839 Thrombocytosis, unspecified: Secondary | ICD-10-CM | POA: Diagnosis present

## 2022-11-01 DIAGNOSIS — J9601 Acute respiratory failure with hypoxia: Secondary | ICD-10-CM | POA: Diagnosis not present

## 2022-11-01 DIAGNOSIS — I700 Atherosclerosis of aorta: Secondary | ICD-10-CM | POA: Diagnosis present

## 2022-11-01 DIAGNOSIS — D6832 Hemorrhagic disorder due to extrinsic circulating anticoagulants: Secondary | ICD-10-CM | POA: Diagnosis present

## 2022-11-01 DIAGNOSIS — Z79899 Other long term (current) drug therapy: Secondary | ICD-10-CM

## 2022-11-01 DIAGNOSIS — T45515A Adverse effect of anticoagulants, initial encounter: Secondary | ICD-10-CM | POA: Diagnosis present

## 2022-11-01 DIAGNOSIS — K810 Acute cholecystitis: Secondary | ICD-10-CM | POA: Diagnosis not present

## 2022-11-01 DIAGNOSIS — T85590A Other mechanical complication of bile duct prosthesis, initial encounter: Secondary | ICD-10-CM | POA: Diagnosis not present

## 2022-11-01 DIAGNOSIS — K8012 Calculus of gallbladder with acute and chronic cholecystitis without obstruction: Secondary | ICD-10-CM | POA: Diagnosis not present

## 2022-11-01 DIAGNOSIS — L7622 Postprocedural hemorrhage and hematoma of skin and subcutaneous tissue following other procedure: Secondary | ICD-10-CM | POA: Diagnosis not present

## 2022-11-01 DIAGNOSIS — Z8673 Personal history of transient ischemic attack (TIA), and cerebral infarction without residual deficits: Secondary | ICD-10-CM | POA: Diagnosis not present

## 2022-11-01 DIAGNOSIS — K869 Disease of pancreas, unspecified: Secondary | ICD-10-CM | POA: Diagnosis not present

## 2022-11-01 DIAGNOSIS — Z886 Allergy status to analgesic agent status: Secondary | ICD-10-CM

## 2022-11-01 DIAGNOSIS — Z79811 Long term (current) use of aromatase inhibitors: Secondary | ICD-10-CM

## 2022-11-01 DIAGNOSIS — Z8041 Family history of malignant neoplasm of ovary: Secondary | ICD-10-CM

## 2022-11-01 DIAGNOSIS — J440 Chronic obstructive pulmonary disease with acute lower respiratory infection: Secondary | ICD-10-CM | POA: Diagnosis present

## 2022-11-01 DIAGNOSIS — Z434 Encounter for attention to other artificial openings of digestive tract: Secondary | ICD-10-CM | POA: Diagnosis not present

## 2022-11-01 DIAGNOSIS — J4489 Other specified chronic obstructive pulmonary disease: Secondary | ICD-10-CM | POA: Diagnosis present

## 2022-11-01 DIAGNOSIS — Z90710 Acquired absence of both cervix and uterus: Secondary | ICD-10-CM

## 2022-11-01 DIAGNOSIS — T85838A Hemorrhage due to other internal prosthetic devices, implants and grafts, initial encounter: Secondary | ICD-10-CM | POA: Diagnosis not present

## 2022-11-01 DIAGNOSIS — C50911 Malignant neoplasm of unspecified site of right female breast: Secondary | ICD-10-CM | POA: Diagnosis not present

## 2022-11-01 DIAGNOSIS — T85518A Breakdown (mechanical) of other gastrointestinal prosthetic devices, implants and grafts, initial encounter: Secondary | ICD-10-CM | POA: Diagnosis not present

## 2022-11-01 DIAGNOSIS — J209 Acute bronchitis, unspecified: Secondary | ICD-10-CM | POA: Diagnosis not present

## 2022-11-01 DIAGNOSIS — R918 Other nonspecific abnormal finding of lung field: Secondary | ICD-10-CM | POA: Diagnosis not present

## 2022-11-01 DIAGNOSIS — E785 Hyperlipidemia, unspecified: Secondary | ICD-10-CM | POA: Diagnosis not present

## 2022-11-01 DIAGNOSIS — R110 Nausea: Secondary | ICD-10-CM | POA: Diagnosis present

## 2022-11-01 DIAGNOSIS — Z801 Family history of malignant neoplasm of trachea, bronchus and lung: Secondary | ICD-10-CM

## 2022-11-01 DIAGNOSIS — Z885 Allergy status to narcotic agent status: Secondary | ICD-10-CM

## 2022-11-01 DIAGNOSIS — J189 Pneumonia, unspecified organism: Secondary | ICD-10-CM | POA: Diagnosis not present

## 2022-11-01 DIAGNOSIS — I5032 Chronic diastolic (congestive) heart failure: Secondary | ICD-10-CM | POA: Diagnosis not present

## 2022-11-01 DIAGNOSIS — C50511 Malignant neoplasm of lower-outer quadrant of right female breast: Secondary | ICD-10-CM | POA: Diagnosis not present

## 2022-11-01 DIAGNOSIS — K8020 Calculus of gallbladder without cholecystitis without obstruction: Secondary | ICD-10-CM | POA: Diagnosis not present

## 2022-11-01 DIAGNOSIS — Z825 Family history of asthma and other chronic lower respiratory diseases: Secondary | ICD-10-CM

## 2022-11-01 DIAGNOSIS — J439 Emphysema, unspecified: Secondary | ICD-10-CM | POA: Diagnosis not present

## 2022-11-01 LAB — CBC WITH DIFFERENTIAL/PLATELET
Abs Immature Granulocytes: 0.07 10*3/uL (ref 0.00–0.07)
Basophils Absolute: 0.1 10*3/uL (ref 0.0–0.1)
Basophils Relative: 1 %
Eosinophils Absolute: 0.4 10*3/uL (ref 0.0–0.5)
Eosinophils Relative: 5 %
HCT: 38.6 % (ref 36.0–46.0)
Hemoglobin: 12.1 g/dL (ref 12.0–15.0)
Immature Granulocytes: 1 %
Lymphocytes Relative: 11 %
Lymphs Abs: 1.1 10*3/uL (ref 0.7–4.0)
MCH: 28.7 pg (ref 26.0–34.0)
MCHC: 31.3 g/dL (ref 30.0–36.0)
MCV: 91.5 fL (ref 80.0–100.0)
Monocytes Absolute: 0.9 10*3/uL (ref 0.1–1.0)
Monocytes Relative: 10 %
Neutro Abs: 7.1 10*3/uL (ref 1.7–7.7)
Neutrophils Relative %: 72 %
Platelets: 412 10*3/uL — ABNORMAL HIGH (ref 150–400)
RBC: 4.22 MIL/uL (ref 3.87–5.11)
RDW: 15 % (ref 11.5–15.5)
WBC: 9.7 10*3/uL (ref 4.0–10.5)
nRBC: 0 % (ref 0.0–0.2)

## 2022-11-01 LAB — COMPREHENSIVE METABOLIC PANEL
ALT: 21 U/L (ref 0–44)
AST: 43 U/L — ABNORMAL HIGH (ref 15–41)
Albumin: 3.2 g/dL — ABNORMAL LOW (ref 3.5–5.0)
Alkaline Phosphatase: 75 U/L (ref 38–126)
Anion gap: 10 (ref 5–15)
BUN: 10 mg/dL (ref 8–23)
CO2: 22 mmol/L (ref 22–32)
Calcium: 9.2 mg/dL (ref 8.9–10.3)
Chloride: 105 mmol/L (ref 98–111)
Creatinine, Ser: 1.02 mg/dL — ABNORMAL HIGH (ref 0.44–1.00)
GFR, Estimated: 55 mL/min — ABNORMAL LOW (ref >60)
Glucose, Bld: 111 mg/dL — ABNORMAL HIGH (ref 70–99)
Potassium: 3.9 mmol/L (ref 3.5–5.1)
Sodium: 137 mmol/L (ref 135–145)
Total Bilirubin: 0.9 mg/dL (ref 0.3–1.2)
Total Protein: 6.6 g/dL (ref 6.5–8.1)

## 2022-11-01 LAB — URINALYSIS, W/ REFLEX TO CULTURE (INFECTION SUSPECTED)
Bilirubin Urine: NEGATIVE
Glucose, UA: NEGATIVE mg/dL
Ketones, ur: NEGATIVE mg/dL
Nitrite: NEGATIVE
Protein, ur: NEGATIVE mg/dL
Specific Gravity, Urine: 1.013 (ref 1.005–1.030)
pH: 6 (ref 5.0–8.0)

## 2022-11-01 LAB — LACTIC ACID, PLASMA: Lactic Acid, Venous: 0.9 mmol/L (ref 0.5–1.9)

## 2022-11-01 LAB — PROTIME-INR
INR: 1.5 — ABNORMAL HIGH (ref 0.8–1.2)
Prothrombin Time: 18 seconds — ABNORMAL HIGH (ref 11.4–15.2)

## 2022-11-01 NOTE — ED Triage Notes (Addendum)
Patient BIB POV  from home. Patient has a drain tube in gallbladder that is now bleeding from insertion site.   Drain tube placed on 10/21/22.  Patient reports increased pain internally. Patient denies new injury and denies pulling on tube. Notable serous sanguis fluid at site.

## 2022-11-01 NOTE — Telephone Encounter (Signed)
Called pt and lvm

## 2022-11-01 NOTE — Telephone Encounter (Signed)
Chris with [facility] called asking for approval for patient to get physical therapy. States patient was in the hospital for 8 days and has a gallbladder drain. Best callback number is [phone].

## 2022-11-02 ENCOUNTER — Observation Stay (HOSPITAL_COMMUNITY): Payer: Medicare Other

## 2022-11-02 ENCOUNTER — Emergency Department (HOSPITAL_COMMUNITY): Payer: Medicare Other

## 2022-11-02 ENCOUNTER — Encounter (HOSPITAL_COMMUNITY): Payer: Self-pay | Admitting: Internal Medicine

## 2022-11-02 DIAGNOSIS — N200 Calculus of kidney: Secondary | ICD-10-CM | POA: Diagnosis not present

## 2022-11-02 DIAGNOSIS — J189 Pneumonia, unspecified organism: Secondary | ICD-10-CM | POA: Diagnosis not present

## 2022-11-02 DIAGNOSIS — Z87891 Personal history of nicotine dependence: Secondary | ICD-10-CM | POA: Diagnosis not present

## 2022-11-02 DIAGNOSIS — I480 Paroxysmal atrial fibrillation: Secondary | ICD-10-CM | POA: Diagnosis present

## 2022-11-02 DIAGNOSIS — L7622 Postprocedural hemorrhage and hematoma of skin and subcutaneous tissue following other procedure: Secondary | ICD-10-CM | POA: Diagnosis present

## 2022-11-02 DIAGNOSIS — D6832 Hemorrhagic disorder due to extrinsic circulating anticoagulants: Secondary | ICD-10-CM | POA: Diagnosis present

## 2022-11-02 DIAGNOSIS — Z886 Allergy status to analgesic agent status: Secondary | ICD-10-CM | POA: Diagnosis not present

## 2022-11-02 DIAGNOSIS — Z434 Encounter for attention to other artificial openings of digestive tract: Secondary | ICD-10-CM | POA: Diagnosis not present

## 2022-11-02 DIAGNOSIS — K8012 Calculus of gallbladder with acute and chronic cholecystitis without obstruction: Secondary | ICD-10-CM | POA: Diagnosis present

## 2022-11-02 DIAGNOSIS — Z888 Allergy status to other drugs, medicaments and biological substances status: Secondary | ICD-10-CM | POA: Diagnosis not present

## 2022-11-02 DIAGNOSIS — E785 Hyperlipidemia, unspecified: Secondary | ICD-10-CM | POA: Diagnosis present

## 2022-11-02 DIAGNOSIS — T85518A Breakdown (mechanical) of other gastrointestinal prosthetic devices, implants and grafts, initial encounter: Secondary | ICD-10-CM | POA: Diagnosis not present

## 2022-11-02 DIAGNOSIS — Z7901 Long term (current) use of anticoagulants: Secondary | ICD-10-CM | POA: Diagnosis not present

## 2022-11-02 DIAGNOSIS — R918 Other nonspecific abnormal finding of lung field: Secondary | ICD-10-CM | POA: Diagnosis not present

## 2022-11-02 DIAGNOSIS — Z79811 Long term (current) use of aromatase inhibitors: Secondary | ICD-10-CM | POA: Diagnosis not present

## 2022-11-02 DIAGNOSIS — T85838A Hemorrhage due to other internal prosthetic devices, implants and grafts, initial encounter: Secondary | ICD-10-CM | POA: Diagnosis not present

## 2022-11-02 DIAGNOSIS — J209 Acute bronchitis, unspecified: Secondary | ICD-10-CM | POA: Diagnosis present

## 2022-11-02 DIAGNOSIS — Z881 Allergy status to other antibiotic agents status: Secondary | ICD-10-CM | POA: Diagnosis not present

## 2022-11-02 DIAGNOSIS — J4489 Other specified chronic obstructive pulmonary disease: Secondary | ICD-10-CM | POA: Diagnosis not present

## 2022-11-02 DIAGNOSIS — J440 Chronic obstructive pulmonary disease with acute lower respiratory infection: Secondary | ICD-10-CM | POA: Diagnosis present

## 2022-11-02 DIAGNOSIS — J439 Emphysema, unspecified: Secondary | ICD-10-CM | POA: Diagnosis not present

## 2022-11-02 DIAGNOSIS — J9809 Other diseases of bronchus, not elsewhere classified: Secondary | ICD-10-CM | POA: Diagnosis not present

## 2022-11-02 DIAGNOSIS — R110 Nausea: Secondary | ICD-10-CM | POA: Diagnosis present

## 2022-11-02 DIAGNOSIS — I5032 Chronic diastolic (congestive) heart failure: Secondary | ICD-10-CM | POA: Diagnosis present

## 2022-11-02 DIAGNOSIS — Z8673 Personal history of transient ischemic attack (TIA), and cerebral infarction without residual deficits: Secondary | ICD-10-CM | POA: Diagnosis not present

## 2022-11-02 DIAGNOSIS — C50911 Malignant neoplasm of unspecified site of right female breast: Secondary | ICD-10-CM | POA: Diagnosis present

## 2022-11-02 DIAGNOSIS — D75839 Thrombocytosis, unspecified: Secondary | ICD-10-CM | POA: Diagnosis present

## 2022-11-02 DIAGNOSIS — I700 Atherosclerosis of aorta: Secondary | ICD-10-CM | POA: Diagnosis present

## 2022-11-02 DIAGNOSIS — J9601 Acute respiratory failure with hypoxia: Secondary | ICD-10-CM | POA: Diagnosis present

## 2022-11-02 DIAGNOSIS — Z90710 Acquired absence of both cervix and uterus: Secondary | ICD-10-CM | POA: Diagnosis not present

## 2022-11-02 DIAGNOSIS — Z885 Allergy status to narcotic agent status: Secondary | ICD-10-CM | POA: Diagnosis not present

## 2022-11-02 DIAGNOSIS — K8020 Calculus of gallbladder without cholecystitis without obstruction: Secondary | ICD-10-CM | POA: Diagnosis not present

## 2022-11-02 DIAGNOSIS — K810 Acute cholecystitis: Secondary | ICD-10-CM | POA: Diagnosis not present

## 2022-11-02 DIAGNOSIS — Z91048 Other nonmedicinal substance allergy status: Secondary | ICD-10-CM | POA: Diagnosis not present

## 2022-11-02 DIAGNOSIS — Z79899 Other long term (current) drug therapy: Secondary | ICD-10-CM | POA: Diagnosis not present

## 2022-11-02 DIAGNOSIS — T85590A Other mechanical complication of bile duct prosthesis, initial encounter: Secondary | ICD-10-CM | POA: Diagnosis not present

## 2022-11-02 HISTORY — PX: IR EXCHANGE BILIARY DRAIN: IMG6046

## 2022-11-02 LAB — LACTIC ACID, PLASMA: Lactic Acid, Venous: 1.2 mmol/L (ref 0.5–1.9)

## 2022-11-02 LAB — CBC
HCT: 35.8 % — ABNORMAL LOW (ref 36.0–46.0)
Hemoglobin: 11 g/dL — ABNORMAL LOW (ref 12.0–15.0)
MCH: 28.3 pg (ref 26.0–34.0)
MCHC: 30.7 g/dL (ref 30.0–36.0)
MCV: 92 fL (ref 80.0–100.0)
Platelets: 381 10*3/uL (ref 150–400)
RBC: 3.89 MIL/uL (ref 3.87–5.11)
RDW: 15.1 % (ref 11.5–15.5)
WBC: 11.1 10*3/uL — ABNORMAL HIGH (ref 4.0–10.5)
nRBC: 0 % (ref 0.0–0.2)

## 2022-11-02 LAB — COMPREHENSIVE METABOLIC PANEL
ALT: 39 U/L (ref 0–44)
AST: 143 U/L — ABNORMAL HIGH (ref 15–41)
Albumin: 2.9 g/dL — ABNORMAL LOW (ref 3.5–5.0)
Alkaline Phosphatase: 168 U/L — ABNORMAL HIGH (ref 38–126)
Anion gap: 10 (ref 5–15)
BUN: 10 mg/dL (ref 8–23)
CO2: 20 mmol/L — ABNORMAL LOW (ref 22–32)
Calcium: 8.8 mg/dL — ABNORMAL LOW (ref 8.9–10.3)
Chloride: 105 mmol/L (ref 98–111)
Creatinine, Ser: 0.95 mg/dL (ref 0.44–1.00)
GFR, Estimated: 60 mL/min — ABNORMAL LOW (ref >60)
Glucose, Bld: 118 mg/dL — ABNORMAL HIGH (ref 70–99)
Potassium: 4.3 mmol/L (ref 3.5–5.1)
Sodium: 135 mmol/L (ref 135–145)
Total Bilirubin: 1.3 mg/dL — ABNORMAL HIGH (ref 0.3–1.2)
Total Protein: 5.7 g/dL — ABNORMAL LOW (ref 6.5–8.1)

## 2022-11-02 LAB — URINE CULTURE: Culture: 50000 — AB

## 2022-11-02 MED ORDER — PREDNISONE 50 MG PO TABS
60.0000 mg | ORAL_TABLET | Freq: Once | ORAL | Status: AC
  Administered 2022-11-02: 60 mg via ORAL
  Filled 2022-11-02: qty 1

## 2022-11-02 MED ORDER — ALBUTEROL SULFATE (2.5 MG/3ML) 0.083% IN NEBU
2.5000 mg | INHALATION_SOLUTION | RESPIRATORY_TRACT | Status: DC | PRN
  Filled 2022-11-02: qty 3

## 2022-11-02 MED ORDER — APIXABAN 5 MG PO TABS: 5.0000 mg | ORAL_TABLET | Freq: Two times a day (BID) | ORAL | Status: DC

## 2022-11-02 MED ORDER — LACTATED RINGERS IV SOLN: INTRAVENOUS | Status: DC

## 2022-11-02 MED ORDER — LIDOCAINE HCL 1 % IJ SOLN
INTRAMUSCULAR | Status: AC
  Filled 2022-11-02: qty 20

## 2022-11-02 MED ORDER — ONDANSETRON HCL 4 MG PO TABS: 4.0000 mg | ORAL_TABLET | Freq: Four times a day (QID) | ORAL | Status: DC | PRN

## 2022-11-02 MED ORDER — DOXYCYCLINE HYCLATE 100 MG PO TABS
100.0000 mg | ORAL_TABLET | Freq: Once | ORAL | Status: DC
  Filled 2022-11-02: qty 1

## 2022-11-02 MED ORDER — IOHEXOL 300 MG/ML  SOLN
50.0000 mL | Freq: Once | INTRAMUSCULAR | Status: AC | PRN
  Administered 2022-11-02: 20 mL

## 2022-11-02 MED ORDER — ACETAMINOPHEN 650 MG RE SUPP: 650.0000 mg | Freq: Four times a day (QID) | RECTAL | Status: DC | PRN

## 2022-11-02 MED ORDER — PANTOPRAZOLE SODIUM 40 MG PO TBEC
40.0000 mg | DELAYED_RELEASE_TABLET | Freq: Every day | ORAL | Status: DC
  Administered 2022-11-02 – 2022-11-08 (×7): 40 mg via ORAL
  Filled 2022-11-02 (×7): qty 1

## 2022-11-02 MED ORDER — IOHEXOL 350 MG/ML SOLN
75.0000 mL | Freq: Once | INTRAVENOUS | Status: AC | PRN
  Administered 2022-11-02: 75 mL via INTRAVENOUS

## 2022-11-02 MED ORDER — ONDANSETRON HCL 4 MG/2ML IJ SOLN
4.0000 mg | Freq: Once | INTRAMUSCULAR | Status: AC
  Administered 2022-11-02: 4 mg via INTRAVENOUS
  Filled 2022-11-02: qty 2

## 2022-11-02 MED ORDER — MOMETASONE FURO-FORMOTEROL FUM 200-5 MCG/ACT IN AERO
2.0000 | INHALATION_SPRAY | Freq: Two times a day (BID) | RESPIRATORY_TRACT | Status: DC
  Administered 2022-11-02 – 2022-11-08 (×13): 2 via RESPIRATORY_TRACT
  Filled 2022-11-02 (×2): qty 8.8

## 2022-11-02 MED ORDER — SODIUM CHLORIDE 0.9 % IV SOLN
1.0000 g | Freq: Once | INTRAVENOUS | Status: AC
  Administered 2022-11-02: 1 g via INTRAVENOUS
  Filled 2022-11-02: qty 10

## 2022-11-02 MED ORDER — ONDANSETRON HCL 4 MG/2ML IJ SOLN
INTRAMUSCULAR | Status: AC
  Administered 2022-11-02: 4 mg via INTRAVENOUS
  Filled 2022-11-02: qty 2

## 2022-11-02 MED ORDER — AMIODARONE HCL 200 MG PO TABS
200.0000 mg | ORAL_TABLET | Freq: Every day | ORAL | Status: DC
  Administered 2022-11-04 – 2022-11-08 (×5): 200 mg via ORAL
  Filled 2022-11-02 (×5): qty 1

## 2022-11-02 MED ORDER — ONDANSETRON 4 MG PO TBDP: 4.0000 mg | ORAL_TABLET | Freq: Once | ORAL | Status: DC

## 2022-11-02 MED ORDER — ONDANSETRON HCL 4 MG/2ML IJ SOLN: 4.0000 mg | Freq: Once | INTRAMUSCULAR | Status: AC

## 2022-11-02 MED ORDER — ACETAMINOPHEN 325 MG PO TABS: 650.0000 mg | ORAL_TABLET | Freq: Four times a day (QID) | ORAL | Status: DC | PRN

## 2022-11-02 MED ORDER — IPRATROPIUM-ALBUTEROL 0.5-2.5 (3) MG/3ML IN SOLN
3.0000 mL | Freq: Once | RESPIRATORY_TRACT | Status: DC
  Filled 2022-11-02: qty 3

## 2022-11-02 MED ORDER — SODIUM CHLORIDE 0.9 % IV SOLN
1.0000 g | INTRAVENOUS | Status: DC
  Administered 2022-11-03 – 2022-11-08 (×6): 1 g via INTRAVENOUS
  Filled 2022-11-02 (×6): qty 10

## 2022-11-02 MED ORDER — LACTATED RINGERS IV BOLUS
500.0000 mL | Freq: Once | INTRAVENOUS | Status: AC
  Administered 2022-11-02: 500 mL via INTRAVENOUS

## 2022-11-02 MED ORDER — ONDANSETRON HCL 4 MG/2ML IJ SOLN
4.0000 mg | Freq: Four times a day (QID) | INTRAMUSCULAR | Status: DC | PRN
  Administered 2022-11-02: 4 mg via INTRAVENOUS
  Filled 2022-11-02: qty 2

## 2022-11-02 MED ORDER — DOXYCYCLINE HYCLATE 100 MG PO TABS
100.0000 mg | ORAL_TABLET | Freq: Once | ORAL | Status: AC
  Administered 2022-11-02: 100 mg via ORAL
  Filled 2022-11-02: qty 1

## 2022-11-02 MED ORDER — OXYCODONE-ACETAMINOPHEN 5-325 MG PO TABS
1.0000 | ORAL_TABLET | Freq: Once | ORAL | Status: AC
  Administered 2022-11-02: 1 via ORAL
  Filled 2022-11-02: qty 1

## 2022-11-02 MED ORDER — PREDNISONE 50 MG PO TABS
60.0000 mg | ORAL_TABLET | Freq: Once | ORAL | Status: DC
  Filled 2022-11-02: qty 3

## 2022-11-02 NOTE — Assessment & Plan Note (Addendum)
Seen on CT scan O2 requirement initially, but weaning Cont home scheduled and PRN nebs Cont rocephin for the moment Noted that rocephin also happens to cover both organisms that grew out from cholecystitis cholecystostomy culture last week.

## 2022-11-02 NOTE — Progress Notes (Signed)
Progress Note     Subjective: Pt's son at bedside and reports she has not really done well since discharge. Biliary drain only put out around 3-5 ml BID and was usually bilious. He also reports she had some bloody and bilious drainage around the drain at the skin as well. She has not been eating much at all. She has not felt well or like herself. She does not specifically complain of RUQ pain.   Objective: Vital signs in last 24 hours: Temp:  [97.3 F (36.3 C)-98 F (36.7 C)] 97.5 F (36.4 C) (06/11 0822) Pulse Rate:  [56-69] 56 (06/11 0822) Resp:  [16-23] 16 (06/11 0822) BP: (109-129)/(45-66) 109/54 (06/11 0822) SpO2:  [92 %-96 %] 96 % (06/11 0822) Weight:  [90 kg] 90 kg (06/10 2148) Last BM Date : 11/01/22  Intake/Output from previous day: 06/10 0701 - 06/11 0700 In: 0.4 [IV Piggyback:0.4] Out: -  Intake/Output this shift: No intake/output data recorded.  PE: General: pleasant, WD, elderly female who is laying in bed in NAD HEENT: sclera anicteric  Heart: regular, rate, and rhythm.   Lungs:  Respiratory effort nonlabored Abd: soft, NT, ND, drain with ss fluid and some blood at the skin but no active bleeding  Psych: A&Ox3 with an appropriate affect.    Lab Results:  Recent Labs    11/01/22 2222 11/02/22 0145  WBC 9.7 11.1*  HGB 12.1 11.0*  HCT 38.6 35.8*  PLT 412* 381   BMET Recent Labs    11/01/22 2222 11/02/22 0145  NA 137 135  K 3.9 4.3  CL 105 105  CO2 22 20*  GLUCOSE 111* 118*  BUN 10 10  CREATININE 1.02* 0.95  CALCIUM 9.2 8.8*   PT/INR Recent Labs    11/01/22 2222  LABPROT 18.0*  INR 1.5*   CMP     Component Value Date/Time   NA 135 11/02/2022 0145   NA 144 07/22/2022 0844   K 4.3 11/02/2022 0145   CL 105 11/02/2022 0145   CO2 20 (L) 11/02/2022 0145   GLUCOSE 118 (H) 11/02/2022 0145   GLUCOSE 111 (H) 03/04/2006 1034   BUN 10 11/02/2022 0145   BUN 16 07/22/2022 0844   CREATININE 0.95 11/02/2022 0145   CREATININE 0.96 11/16/2017  0812   CALCIUM 8.8 (L) 11/02/2022 0145   PROT 5.7 (L) 11/02/2022 0145   ALBUMIN 2.9 (L) 11/02/2022 0145   AST 143 (H) 11/02/2022 0145   AST 27 11/16/2017 0812   ALT 39 11/02/2022 0145   ALT 23 11/16/2017 0812   ALKPHOS 168 (H) 11/02/2022 0145   BILITOT 1.3 (H) 11/02/2022 0145   BILITOT 0.5 11/16/2017 0812   GFRNONAA 60 (L) 11/02/2022 0145   GFRNONAA 56 (L) 11/16/2017 0812   GFRAA >60 12/22/2017 1402   GFRAA >60 11/16/2017 0812   Lipase  No results found for: "LIPASE"     Studies/Results: CT CHEST ABDOMEN PELVIS W CONTRAST  Result Date: 11/02/2022 CLINICAL DATA:  Unintended weight loss. Drain tubing gallbladder that is now bleeding. EXAM: CT CHEST, ABDOMEN, AND PELVIS WITH CONTRAST TECHNIQUE: Multidetector CT imaging of the chest, abdomen and pelvis was performed following the standard protocol during bolus administration of intravenous contrast. RADIATION DOSE REDUCTION: This exam was performed according to the departmental dose-optimization program which includes automated exposure control, adjustment of the mA and/or kV according to patient size and/or use of iterative reconstruction technique. CONTRAST:  75mL OMNIPAQUE IOHEXOL 350 MG/ML SOLN COMPARISON:  Abdominal ultrasound 11/01/2022; CT abdomen and  pelvis 10/19/2022; CT chest 07/28/2006 FINDINGS: CT CHEST FINDINGS Cardiovascular: Mitral annular calcification. Coronary artery and aortic atherosclerotic calcification. Normal caliber aorta without dissection. Normal central pulmonary embolism. No pericardial effusion. Mediastinum/Nodes: Small hiatal hernia. Esophagus is otherwise unremarkable. No thoracic adenopathy. Lungs/Pleura: Emphysema. Bronchial wall thickening in the lower lobes with scattered mucous plugs. Clustered micro nodules in the left lower lobe. 5 mm subpleural nodule in the left lower lobe (5/92) stable since 2008 and benign. No follow-up recommended. Musculoskeletal: No acute fracture. CT ABDOMEN PELVIS FINDINGS  Hepatobiliary: Cholecystostomy tube in the gallbladder. Multiple gallstones are present within the gallbladder. Gallbladder wall thickening and adjacent stranding, slightly improved from 10/19/2022. Tiny hepatic cysts. Liver is otherwise unremarkable. Pancreas: Fatty atrophy. Unchanged 2.9 cm cystic lesion at the tail of the pancreas. Spleen: Unremarkable. Adrenals/Urinary Tract: Stable adrenal glands. Bilateral nephrolithiasis. Unchanged 4 mm stone in the mid right ureter. No hydronephrosis. Unremarkable bladder. Stomach/Bowel: Normal caliber large and small bowel. No bowel wall thickening. Normal appendix. Stomach is within normal limits. Vascular/Lymphatic: Aortic atherosclerosis. No enlarged abdominal or pelvic lymph nodes. Reproductive: Hysterectomy.  No adnexal mass. Other: Multiple soft tissue nodules in the presacral space measuring up to 2.0 cm (series 3/image 108). Musculoskeletal: No acute fracture. IMPRESSION: CHEST 1. Lower lobe predominant airway infection/inflammation. 2. Emphysema. ABDOMEN/PELVIS 1. Cholecystostomy tube in place within the gallbladder. 2. Cholelithiasis with slightly improved wall thickening and pericholecystic fluid compared with 10/19/2022. 3. Bilateral nephrolithiasis. Unchanged 4 mm stone in the mid right ureter. No hydronephrosis. 4. Multiple soft tissue nodules in the presacral space measuring up to 2.0 cm. These are indeterminate and malignancy is not excluded. Short interval follow-up in 3 months is recommended to ensure stability. 5. Unchanged 2.9 cm cystic lesion at the tail of the pancreas. Continued attention on follow-up. Aortic Atherosclerosis (ICD10-I70.0) and Emphysema (ICD10-J43.9). Electronically Signed   By: Minerva Fester M.D.   On: 11/02/2022 02:31   US Abdomen Limited RUQ (LIVER/GB)  Result Date: 11/01/2022 CLINICAL DATA:  Encounter for biliary drainage tubes EXAM: ULTRASOUND ABDOMEN LIMITED RIGHT UPPER QUADRANT COMPARISON:  CT abdomen and pelvis 10/19/2022  FINDINGS: Gallbladder: Gallbladder filled with stones. Mild wall thickening measuring 5 mm in thickness. Common bile duct: Diameter: 5 mm.  No intrahepatic biliary dilation. Liver: No focal lesion identified. Increased parenchymal echogenicity. Portal vein is patent on color Doppler imaging with normal direction of blood flow towards the liver. Other: None. IMPRESSION: 1. Cholelithiasis with mild wall thickening of the gallbladder suggesting cholecystitis. 2. Hepatic steatosis. Electronically Signed   By: Minerva Fester M.D.   On: 11/01/2022 23:08   DG Chest 2 View  Result Date: 11/01/2022 CLINICAL DATA:  Encounter for suspected sepsis. EXAM: CHEST - 2 VIEW COMPARISON:  Portable chest 10/24/2022. FINDINGS: The cardiac size is normal, with again noted prominent left main pulmonary artery, stable mediastinum. There is mild aortic atherosclerosis. There are scattered atelectatic bands in both bases. On the left there is also streaky opacity in the lingula which could be due to atelectasis or a small pneumonia. Appearance of the frontal view is similar to the prior study. On the lateral view, the lingular opacities were not seen on PA Lat chest 03/29/2022. The remaining lungs are clear.  No pleural collection is seen. Osteopenia and thoracic spondylosis. 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 IMPRESSION: Lingular opacity which could be atelectasis or a small pneumonia. Clinical correlation and radiographic follow-up recommended. Electronically Signed   By: Almira Bar M.D.   On: 11/01/2022 22:26  Anti-infectives: Anti-infectives (From admission, onward)    Start     Dose/Rate Route Frequency Ordered Stop   11/03/22 0400  cefTRIAXone (ROCEPHIN) 1 g in sodium chloride 0.9 % 100 mL IVPB        1 g 200 mL/hr over 30 Minutes Intravenous Every 24 hours 11/02/22 0507     11/02/22 0900  doxycycline (VIBRA-TABS) tablet 100 mg        100 mg Oral  Once 11/02/22 0813 11/02/22 0833   11/02/22 0315   cefTRIAXone (ROCEPHIN) 1 g in sodium chloride 0.9 % 100 mL IVPB        1 g 200 mL/hr over 30 Minutes Intravenous  Once 11/02/22 0309 11/02/22 0531   11/02/22 0315  doxycycline (VIBRA-TABS) tablet 100 mg  Status:  Discontinued        100 mg Oral  Once 11/02/22 0309 11/02/22 0813        Assessment/Plan  Acute cholecystitis  - s/p IR perc chole drain placement 5/30 - Cxs with Hafnia Alvei and Strep sanguinis - she was discharged 6/5 on bactrim, finished course on Sunday - drain has not been putting out much at home and she has not felt well since discharge - came in with bleeding around and from drain - hgb 11 which is stable from last admission - reached out for IR to evaluate drain and consider cholangiogram  - no plans for emergent surgical intervention but we will follow    FEN: CLD VTE: ok with Heparin gtt, hold Eliquis ID: rocephin 6/11>   - per [facility] -  Paroxysmal A. Fib on Eliquis - last dose yesterday AM Breast cancer Chronic diastolic CHF Hx of CVA   LOS: 0 days   I reviewed hospitalist notes, last 24 h vitals and pain scores, last 48 h intake and output, last 24 h labs and trends, and last 24 h imaging results.   Juliet Rude, [facility] Surgery 11/02/2022, 10:16 AM Please see Amion for pager number during day hours 7:00am-4:30pm

## 2022-11-02 NOTE — Procedures (Signed)
Interventional Radiology Procedure Note  Procedure: Cholecystostomy drain upsize  Indication: Bleeding along Cholecystostomy drain tract  Findings: Please refer to procedural dictation for full description.  Complications: None  EBL: < 10 mL  Acquanetta Belling, MD [phone]

## 2022-11-02 NOTE — ED Notes (Signed)
After CT, pt became nauseated with dry heaves. States she never produced vomit. After this episode and receiving zofran, O2 sats decreased to 84% with good pleth. Improved to 96% on 2L. No increased work of breaking during this episode.

## 2022-11-02 NOTE — Assessment & Plan Note (Signed)
Cont home scheduled and PRN nebs. Got single dose of prednisone in ED, will hold off on further prednisone for the moment till we see how she does.

## 2022-11-02 NOTE — H&P (Addendum)
History and Physical    Patient: Laura Mendez JSE:[id number] DOB: April 16, 1941 DOA: 11/01/2022 DOS: the patient was seen and examined on 11/02/2022 PCP: [name], MD  Patient coming from: Home  Chief Complaint:  Chief Complaint  Patient presents with   Wound Check   HPI: Laura Mendez is a 82 y.o. female with medical history significant of asthma / COPD, PAF on eliquis, prior stroke / TIA.  Pt recently admitted from 5/28-6/5 with acute cholecystitis.  Cholecystostomy drain placed on 5/30 and pt put on ABx: Zosyn while in hospital followed by bactrim on discharge.  Finished ABx yesterday.  Pt also on amiodarone taper for PAF.  She has previously been having approximately 5 cc of what is described as a brown fluid draining into her cholecystostomy bag.  The glue holding the cholecystostomy device to skin has come undone per family.  Earlier today had episode of epigastric pain.  Then tonight she had sudden onset of bloody output into cholecystostomy bag as well as around the tube insertion site.  Came to ED actively bleeding.  In ED: pressure dressing applied and bleeding appears to have stopped.  CT AP findings as documented in results section.  HGB this evening of 12.1 is essentially unchanged from 6 days ago (12.2)  EDP initially called general surgery, see EDP note for documentation here.  EDP reports that pt had desaturation in ED and new O2 requirement that onset since she came in.  Concern for infection based on CT results (EDP says PNA though the way it is read sounds more like bronchitis).  Started on empiric ABx, duoneb, and prednisone in ED.  Satting upper 90s on 2L via [place].  No wheezing, no productive cough.  Abd pain she had earlier is resolved at this time.  Was having nausea earlier that seems improved after 2 rounds of zofran.  Review of Systems: As mentioned in the history of present illness. All other systems reviewed and are  negative. Past Medical History:  Diagnosis Date   Asthma    Breast cancer (HCC)    Chronic airway obstruction, not elsewhere classified    Diastolic dysfunction without heart failure    Disorder of bone and cartilage, unspecified    Dysrhythmia    A-Fib   Family history of colon cancer    Family history of kidney cancer    Family history of ovarian cancer    Paroxysmal atrial flutter (HCC)    Stroke (HCC)    TIA   TIA (transient ischemic attack)    Unspecified asthma(493.90)    Past Surgical History:  Procedure Laterality Date   ABDOMINAL HYSTERECTOMY     BREAST LUMPECTOMY Right 12/30/2017   BREAST LUMPECTOMY WITH RADIOACTIVE SEED LOCALIZATION Right 12/30/2017   Procedure: BREAST LUMPECTOMY WITH RADIOACTIVE SEED LOCALIZATION X'S 3;  Surgeon: Emelia Loron, MD;  Location: [facility] OR;  Service: General;  Laterality: Right;   BUNIONECTOMY Bilateral    EYE SURGERY Bilateral    cataract removal   IR PERC CHOLECYSTOSTOMY  10/21/2022   LEFT HEART CATH AND CORONARY ANGIOGRAPHY N/A 11/23/2021   Procedure: LEFT HEART CATH AND CORONARY ANGIOGRAPHY;  Surgeon: Tonny Bollman, MD;  Location: [facility] INVASIVE CV LAB;  Service: Cardiovascular;  Laterality: N/A;   Social History:  reports that she quit smoking about 45 years ago. Her smoking use included cigarettes. She has never used smokeless tobacco. She reports that she does not drink alcohol and does not use drugs.  Allergies  Allergen Reactions  Clarithromycin Anaphylaxis    "just about killed me"   Aspirin Hives   Ibuprofen Hives   Codeine Nausea And Vomiting   Diltiazem Rash   Methotrexate Other (See Comments)     tongue swelling and mouth soreness   Tape Itching and Other (See Comments)    Tears skin   Tapentadol Itching   Verapamil Hcl Er Rash    Family History  Problem Relation Age of Onset   COPD Father    Colon cancer Mother 63       mets to pancreas and liver   Kidney cancer Sister 41       d. 3   Lung cancer Sister         d. 85, smoker   Ovarian cancer Sister 46       d. 48   Cirrhosis Sister        d. 11   Melanoma Sister 74   Ovarian cancer Maternal Grandmother        d. 58   Heart disease Maternal Grandfather        rheumatic heart disease   Rectal cancer Paternal Grandfather        d. 73   Parkinson's disease Brother    Ovarian cancer Maternal Aunt    Colon cancer Maternal Uncle        d. 72-73   Heart attack Paternal Uncle        d. 33   Kidney cancer Brother 20   Colon cancer Maternal Uncle        d. 48   Cancer Other        MGMs pat 1/2 sister with uterine and rectal cancer   Colon cancer Cousin        d. 64 - mat first cousin   Breast cancer Neg Hx     Prior to Admission medications   Medication Sig Start Date End Date Taking? Authorizing Provider  albuterol (VENTOLIN HFA) 108 (90 Base) MCG/ACT inhaler INHALE 2 PUFFS BY MOUTH EVERY 4 HOURS AS NEEDED 10/06/22   Jetty Duhamel D, MD  amiodarone (PACERONE) 200 MG tablet Take 2 tablets (400 mg total) by mouth 2 (two) times daily for 5 days, THEN 1 tablet (200 mg total) daily for 21 days. Stop amiodarone after taking 200mg  daily for 21 days.. 10/27/22 11/22/22  Burnadette Pop, MD  ammonium lactate (LAC-HYDRIN) 12 % cream Apply 1 Application topically as needed for dry skin. 04/28/22   Ellsworth Lennox, PA-C  apixaban (ELIQUIS) 5 MG TABS tablet Take 1 tablet by mouth twice daily 07/26/22   Lanier Prude, MD  DUPIXENT 300 MG/2ML SOPN Inject 300 mg into the skin every 14 (fourteen) days. Patient not taking: Reported on 10/28/2022 10/30/21   [provider]  fluticasone-salmeterol (ADVAIR) 250-50 MCG/ACT AEPB INHALE 1 PUFF INTO LUNGS TWICE DAILY Patient taking differently: Inhale 1 puff into the lungs in the morning and at bedtime. 05/20/22   Jetty Duhamel D, MD  letrozole [facility]) 2.5 MG tablet Take 1 tablet by mouth once daily 10/08/22   Serena Croissant, MD  Multiple Vitamin (MULTIVITAMIN) tablet Take 1 tablet by mouth daily.    [provider]  pantoprazole (PROTONIX) 40 MG tablet Take 1 tablet (40 mg total) by mouth daily. 10/29/22   [name], MD  triamcinolone cream (KENALOG) 0.1 % Apply 1 Application topically 2 (two) times daily. 04/28/22   Ellsworth Lennox, PA-C    Physical Exam: Vitals:   11/02/22 0020 11/02/22  0100 11/02/22 0112 11/02/22 0300  BP: 126/66 (!) 109/45  117/63  Pulse: 69 65  63  Resp: 19 (!) 23  16  Temp:   98 F (36.7 C)   TempSrc:   Oral   SpO2: 92% 95%  92%  Weight:      Height:       Constitutional: NAD, calm, mildly ill appearing. Respiratory: clear to auscultation bilaterally, no wheezing, no crackles. Normal respiratory effort. No accessory muscle use.  Cardiovascular: Regular rate and rhythm, no murmurs / rubs / gallops. No extremity edema. 2+ pedal pulses. No carotid bruits.  Abdomen: diffuse mild TTP, patients shirt has blood stain on it, but pt doesn't appear to be actively bleeding externally at this time, dressing appears dry and intact. Neurologic: CN 2-12 grossly intact. Sensation intact, DTR normal. Strength 5/5 in all 4.  Psychiatric: Normal judgment and insight. Alert and oriented x 3. Normal mood.   Data Reviewed:    Labs on Admission: I have personally reviewed following labs and imaging studies  CBC: Recent Labs  Lab 10/26/22 1133 11/01/22 2222  WBC 12.4* 9.7  NEUTROABS  --  7.1  HGB 12.2 12.1  HCT 38.6 38.6  MCV 89.8 91.5  PLT 475* 412*   Basic Metabolic Panel: Recent Labs  Lab 10/26/22 1133 11/01/22 2222  NA 139 137  K 3.9 3.9  CL 105 105  CO2 24 22  GLUCOSE 104* 111*  BUN 13 10  CREATININE 0.93 1.02*  CALCIUM 8.8* 9.2   GFR: Estimated Creatinine Clearance: 52.7 mL/min (A) (by C-G formula based on SCr of 1.02 mg/dL (H)). Liver Function Tests: Recent Labs  Lab 11/01/22 2222  AST 43*  ALT 21  ALKPHOS 75  BILITOT 0.9  PROT 6.6  ALBUMIN 3.2*   No results for input(s): "LIPASE", "AMYLASE" in the last 168 hours. No results for  input(s): "AMMONIA" in the last 168 hours. Coagulation Profile: Recent Labs  Lab 11/01/22 2222  INR 1.5*   Cardiac Enzymes: No results for input(s): "CKTOTAL", "CKMB", "CKMBINDEX", "TROPONINI" in the last 168 hours. BNP (last 3 results) No results for input(s): "PROBNP" in the last 8760 hours. HbA1C: No results for input(s): "HGBA1C" in the last 72 hours. CBG: Recent Labs  Lab 10/26/22 0555  GLUCAP 110*   Lipid Profile: No results for input(s): "CHOL", "HDL", "LDLCALC", "TRIG", "CHOLHDL", "LDLDIRECT" in the last 72 hours. Thyroid Function Tests: No results for input(s): "TSH", "T4TOTAL", "FREET4", "T3FREE", "THYROIDAB" in the last 72 hours. Anemia Panel: No results for input(s): "VITAMINB12", "FOLATE", "FERRITIN", "TIBC", "IRON", "RETICCTPCT" in the last 72 hours. Urine analysis:    Component Value Date/Time   COLORURINE YELLOW 11/01/2022 2247   APPEARANCEUR CLEAR 11/01/2022 2247   LABSPEC 1.013 11/01/2022 2247   PHURINE 6.0 11/01/2022 2247   GLUCOSEU NEGATIVE 11/01/2022 2247   GLUCOSEU NEGATIVE 07/28/2020 1042   HGBUR SMALL (A) 11/01/2022 2247   HGBUR negative 09/18/2009 0846   BILIRUBINUR NEGATIVE 11/01/2022 2247   BILIRUBINUR Negative 10/05/2022 1057   KETONESUR NEGATIVE 11/01/2022 2247   PROTEINUR NEGATIVE 11/01/2022 2247   UROBILINOGEN 1.0 10/05/2022 1057   UROBILINOGEN 0.2 07/28/2020 1042   NITRITE NEGATIVE 11/01/2022 2247   LEUKOCYTESUR TRACE (A) 11/01/2022 2247    Radiological Exams on Admission: CT CHEST ABDOMEN PELVIS W CONTRAST  Result Date: 11/02/2022 CLINICAL DATA:  Unintended weight loss. Drain tubing gallbladder that is now bleeding. EXAM: CT CHEST, ABDOMEN, AND PELVIS WITH CONTRAST TECHNIQUE: Multidetector CT imaging of the chest, abdomen and pelvis was  performed following the standard protocol during bolus administration of intravenous contrast. RADIATION DOSE REDUCTION: This exam was performed according to the departmental dose-optimization program  which includes automated exposure control, adjustment of the mA and/or kV according to patient size and/or use of iterative reconstruction technique. CONTRAST:  75mL OMNIPAQUE IOHEXOL 350 MG/ML SOLN COMPARISON:  Abdominal ultrasound 11/01/2022; CT abdomen and pelvis 10/19/2022; CT chest 07/28/2006 FINDINGS: CT CHEST FINDINGS Cardiovascular: Mitral annular calcification. Coronary artery and aortic atherosclerotic calcification. Normal caliber aorta without dissection. Normal central pulmonary embolism. No pericardial effusion. Mediastinum/Nodes: Small hiatal hernia. Esophagus is otherwise unremarkable. No thoracic adenopathy. Lungs/Pleura: Emphysema. Bronchial wall thickening in the lower lobes with scattered mucous plugs. Clustered micro nodules in the left lower lobe. 5 mm subpleural nodule in the left lower lobe (5/92) stable since 2008 and benign. No follow-up recommended. Musculoskeletal: No acute fracture. CT ABDOMEN PELVIS FINDINGS Hepatobiliary: Cholecystostomy tube in the gallbladder. Multiple gallstones are present within the gallbladder. Gallbladder wall thickening and adjacent stranding, slightly improved from 10/19/2022. Tiny hepatic cysts. Liver is otherwise unremarkable. Pancreas: Fatty atrophy. Unchanged 2.9 cm cystic lesion at the tail of the pancreas. Spleen: Unremarkable. Adrenals/Urinary Tract: Stable adrenal glands. Bilateral nephrolithiasis. Unchanged 4 mm stone in the mid right ureter. No hydronephrosis. Unremarkable bladder. Stomach/Bowel: Normal caliber large and small bowel. No bowel wall thickening. Normal appendix. Stomach is within normal limits. Vascular/Lymphatic: Aortic atherosclerosis. No enlarged abdominal or pelvic lymph nodes. Reproductive: Hysterectomy.  No adnexal mass. Other: Multiple soft tissue nodules in the presacral space measuring up to 2.0 cm (series 3/image 108). Musculoskeletal: No acute fracture. IMPRESSION: CHEST 1. Lower lobe predominant airway  infection/inflammation. 2. Emphysema. ABDOMEN/PELVIS 1. Cholecystostomy tube in place within the gallbladder. 2. Cholelithiasis with slightly improved wall thickening and pericholecystic fluid compared with 10/19/2022. 3. Bilateral nephrolithiasis. Unchanged 4 mm stone in the mid right ureter. No hydronephrosis. 4. Multiple soft tissue nodules in the presacral space measuring up to 2.0 cm. These are indeterminate and malignancy is not excluded. Short interval follow-up in 3 months is recommended to ensure stability. 5. Unchanged 2.9 cm cystic lesion at the tail of the pancreas. Continued attention on follow-up. Aortic Atherosclerosis (ICD10-I70.0) and Emphysema (ICD10-J43.9). Electronically Signed   By: Minerva Fester M.D.   On: 11/02/2022 02:31   US Abdomen Limited RUQ (LIVER/GB)  Result Date: 11/01/2022 CLINICAL DATA:  Encounter for biliary drainage tubes EXAM: ULTRASOUND ABDOMEN LIMITED RIGHT UPPER QUADRANT COMPARISON:  CT abdomen and pelvis 10/19/2022 FINDINGS: Gallbladder: Gallbladder filled with stones. Mild wall thickening measuring 5 mm in thickness. Common bile duct: Diameter: 5 mm.  No intrahepatic biliary dilation. Liver: No focal lesion identified. Increased parenchymal echogenicity. Portal vein is patent on color Doppler imaging with normal direction of blood flow towards the liver. Other: None. IMPRESSION: 1. Cholelithiasis with mild wall thickening of the gallbladder suggesting cholecystitis. 2. Hepatic steatosis. Electronically Signed   By: Minerva Fester M.D.   On: 11/01/2022 23:08   DG Chest 2 View  Result Date: 11/01/2022 CLINICAL DATA:  Encounter for suspected sepsis. EXAM: CHEST - 2 VIEW COMPARISON:  Portable chest 10/24/2022. FINDINGS: The cardiac size is normal, with again noted prominent left main pulmonary artery, stable mediastinum. There is mild aortic atherosclerosis. There are scattered atelectatic bands in both bases. On the left there is also streaky opacity in the lingula  which could be due to atelectasis or a small pneumonia. Appearance of the frontal view is similar to the prior study. On the lateral view, the lingular opacities were not  seen on PA Lat chest 03/29/2022. The remaining lungs are clear.  No pleural collection is seen. Osteopenia and thoracic spondylosis. 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 IMPRESSION: Lingular opacity which could be atelectasis or a small pneumonia. Clinical correlation and radiographic follow-up recommended. Electronically Signed   By: Almira Bar M.D.   On: 11/01/2022 22:26    EKG: Independently reviewed.   Assessment and Plan: * Cholecystostomy tube dysfunction Pt in to ED with bleeding from cholecystostomy tube site. Pressure dressing applied and bleeding appears stopped at this point: No ongoing external bleeding No internal bleeding visible on CT scan, no hematoma etc HGB appears stable at 12.x on initial labs Cholecystostomy tube appears to be still in place in the gall bladder on CT scan per read Hold eliquis Repeat HGB Have sent message to Dr. Violeta Gelinas, he says that general surgery will see patient. Will defer management of cholecystostomy tube / gallbladder / plans for cholecystectomy to general surgery. Clear liquid diet for the moment LR at 100  Acute bronchitis Seen on CT scan O2 requirement initially, but weaning Cont home scheduled and PRN nebs Cont rocephin for the moment Noted that rocephin also happens to cover both organisms that grew out from cholecystitis cholecystostomy culture last week.  Paroxysmal atrial fibrillation (HCC) Cont amiodarone Holding eliquis given recent bleeding from cholecystostomy tube.  Asthma with COPD (HCC) Cont home scheduled and PRN nebs. Got single dose of prednisone in ED, will hold off on further prednisone for the moment till we see how she does.      Advance Care Planning:   Code Status: Full Code  Consults: Have consulted Dr. Violeta Gelinas,  he states that general surgery will see the patient.  Family Communication: Family at bedside, questions answered to the best of my ability, though I did have to defer many questions regarding the cholecystostomy tube and timing of cholecystectomy to general surgery.  Severity of Illness: The appropriate patient status for this patient is OBSERVATION. Observation status is judged to be reasonable and necessary in order to provide the required intensity of service to ensure the patient's safety. The patient's presenting symptoms, physical exam findings, and initial radiographic and laboratory data in the context of their medical condition is felt to place them at decreased risk for further clinical deterioration. Furthermore, it is anticipated that the patient will be medically stable for discharge from the hospital within 2 midnights of admission.   Author: Hillary Bow., DO 11/02/2022 5:08 AM  For on call review [email or web address].

## 2022-11-02 NOTE — Progress Notes (Signed)
Brief note: -Patient was admitted earlier today. -As per H&P done by Dr. Julian Reil: "Laura Mendez is a 82 y.o. female with medical history significant of asthma / COPD, PAF on eliquis, prior stroke / TIA.   Pt recently admitted from 5/28-6/5 with acute cholecystitis.  Cholecystostomy drain placed on 5/30 and pt put on ABx: Zosyn while in hospital followed by bactrim on discharge.  Finished ABx yesterday.  Pt also on amiodarone taper for PAF.   She has previously been having approximately 5 cc of what is described as a brown fluid draining into her cholecystostomy bag.   The glue holding the cholecystostomy device to skin has come undone per family.   Earlier today had episode of epigastric pain.   Then tonight she had sudden onset of bloody output into cholecystostomy bag as well as around the tube insertion site.  Came to ED actively bleeding.   In ED: pressure dressing applied and bleeding appears to have stopped.   CT AP findings as documented in results section.   HGB this evening of 12.1 is essentially unchanged from 6 days ago (12.2)   EDP initially called general surgery, see EDP note for documentation here.   EDP reports that pt had desaturation in ED and new O2 requirement that onset since she came in.  Concern for infection based on CT results (EDP says PNA though the way it is read sounds more like bronchitis).  Started on empiric ABx, duoneb, and prednisone in ED.  Satting upper 90s on 2L via [place].   No wheezing, no productive cough.  Abd pain she had earlier is resolved at this time.  Was having nausea earlier that seems improved after 2 rounds of zofran".  11/02/2022: Patient seen alongside patient's nurse and patient's 2 sons.  Surgical input is appreciated.  The plan is for drain study today.  Further management depend on above.  Eliquis is on hold.

## 2022-11-02 NOTE — Assessment & Plan Note (Signed)
Cont amiodarone Holding eliquis given recent bleeding from cholecystostomy tube.

## 2022-11-02 NOTE — TOC Initial Note (Signed)
Transition of Care (TOC) - Initial/Assessment Note   Spoke to patient at bedside . Patient lives in two story town home. Son had stair chair lift installed. Patient has no other DME .   Prior to admission patient was independent and driving   Patient had Cholecystostomy tube up sized today in IR  Patient Details  Name: Laura Mendez MRN: [id number] Date of Birth: 02/14/1941  Transition of Care [facility]) CM/SW Contact:    Kingsley Plan, RN Phone Number: 11/02/2022, 3:51 PM  Clinical Narrative:                   Expected Discharge Plan: Home/Self Care Barriers to Discharge: Continued Medical Work up   Patient Goals and CMS Choice Patient states their goals for this hospitalization and ongoing recovery are:: to return to home          Expected Discharge Plan and Services   Discharge Planning Services: CM Consult   Living arrangements for the past 2 months: Single Family Home                 DME Arranged: N/A         HH Arranged: NA          Prior Living Arrangements/Services Living arrangements for the past 2 months: Single Family Home Lives with:: Self Patient language and need for interpreter reviewed:: Yes Do you feel safe going back to the place where you live?: Yes      Need for Family Participation in Patient Care: Yes (Comment) Care giver support system in place?: Yes (comment)   Criminal Activity/Legal Involvement Pertinent to Current Situation/Hospitalization: No - Comment as needed  Activities of Daily Living Home Assistive Devices/Equipment: Dan Humphreys (specify type) ADL Screening (condition at time of admission) Patient's cognitive ability adequate to safely complete daily activities?: Yes Is the patient deaf or have difficulty hearing?: No Does the patient have difficulty seeing, even when wearing glasses/contacts?: No Does the patient have difficulty concentrating, remembering, or making decisions?: No Patient able to express need for  assistance with ADLs?: Yes Does the patient have difficulty dressing or bathing?: No Independently performs ADLs?: Yes (appropriate for developmental age) Does the patient have difficulty walking or climbing stairs?: No Weakness of Legs: None Weakness of Arms/Hands: None  Permission Sought/Granted   Permission granted to share information with : No              Emotional Assessment Appearance:: Appears stated age Attitude/Demeanor/Rapport: Engaged Affect (typically observed): Accepting Orientation: : Oriented to Self, Oriented to Place, Oriented to  Time, Oriented to Situation Alcohol / Substance Use: Not Applicable Psych Involvement: No (comment)  Admission diagnosis:  Acute respiratory failure with hypoxia (HCC) [J96.01] Cholecystostomy tube dysfunction, initial encounter [T85.518A] Pneumonia of both lower lobes due to infectious organism [J18.9] Cholecystostomy tube dysfunction [T85.518A] Patient Active Problem List   Diagnosis Date Noted   Acute bronchitis 11/02/2022   Cholecystostomy tube dysfunction 11/02/2022   Cholecystostomy tube dysfunction, initial encounter 11/02/2022   Sepsis (HCC) 10/19/2022   Acute cholecystitis 10/19/2022   Pancreatic lesion 10/19/2022   Acute cystitis with hematuria 10/05/2022   AKI (acute kidney injury) (HCC) 11/23/2021   Elevated troponin    Atopic dermatitis 10/21/2021   Seasonal allergies 10/21/2021   Actinic keratosis 10/21/2021   Lichenified eczema 10/21/2021   Asthma exacerbation 09/01/2021   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   CVA (cerebral vascular accident) (HCC) 06/28/2021   Chronic diastolic CHF (congestive heart failure) (HCC) 06/28/2021  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   Hematoma of left knee region 06/28/2021   Fall at home, initial encounter 06/28/2021   Pain in right knee 08/07/2019   Pain in right foot 03/11/2018   Malignant neoplasm of lower-outer quadrant of right breast of female, estrogen receptor  positive (HCC) 11/15/2017   Diastolic dysfunction 04/28/2017   Rash and nonspecific skin eruption 04/01/2016   Paroxysmal atrial fibrillation (HCC) 01/13/2016   Retinal vein occlusion 06/16/2015   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 [place] [address] Phone: [phone] Fax: [phone]     Social Determinants of Health (SDOH) Social History: SDOH Screenings   Food Insecurity: No Food Insecurity (11/02/2022)  Housing: Low Risk  (11/02/2022)  Transportation Needs: No Transportation Needs (11/02/2022)  Utilities: Not At Risk (11/02/2022)  Alcohol Screen: Low Risk  (11/16/2021)  Depression (PHQ2-9): Low Risk  (12/08/2021)  Financial Resource Strain: Low Risk  (11/16/2021)  Physical Activity: Insufficiently Active (11/16/2021)  Social Connections: Moderately Isolated (11/16/2021)  Stress: No Stress Concern Present (11/16/2021)  Tobacco Use: Medium Risk (11/02/2022)   SDOH Interventions:     Readmission Risk Interventions    10/27/2022    1:53 PM  Readmission Risk Prevention Plan  Post Dischage Appt Complete  Medication Screening Complete  Transportation Screening Complete

## 2022-11-02 NOTE — Progress Notes (Signed)
Laura Mendez is a 82 y.o. female patient admitted. Awake, alert - oriented  X 4 - no acute distress noted.  VSS - Blood pressure (!) 115/55, pulse 60, temperature 97.6 F (36.4 C), temperature source Oral, resp. rate 17, height 5\' 11"  (1.803 m), weight 90 kg, SpO2 95 %.    IV in place, occlusive dsg intact without redness.  Orientation to room, and floor completed.  Admission INP armband ID verified with patient/family, and in place.   SR up x 2, fall assessment complete, with patient and family able to verbalize understanding of risk associated with falls, and verbalized understanding to call nsg before up out of bed.  Call light within reach, patient able to voice, and demonstrate understanding.  Will cont to eval and treat per MD orders.  Rolland Porter, RN 11/02/2022 6:49 AM

## 2022-11-02 NOTE — ED Provider Notes (Signed)
[place] EMERGENCY DEPARTMENT AT [facility] Provider Note   CSN: [id number] Arrival date & time: 11/01/22  2125     History  Chief Complaint  Patient presents with   Wound Check    Laura Mendez is a 82 y.o. female.   Wound Check Associated symptoms include abdominal pain.  Patient presents for bleeding from percutaneous cholecystostomy tube.  Medical history includes atrial fibrillation, breast CA, CVA, CHF, HLD, asthma, and recent cholecystitis 2 weeks ago.  She underwent percutaneous cholecystostomy on 5/30.  She was given IV Zosyn while in the hospital.  She was discharged on 5 days of Bactrim.  She finished her antibiotics yesterday.  She was previously on 400 mg twice daily of amiodarone.  Dose was decreased to 200 mg twice daily starting yesterday.  She is on Eliquis.  She has been staying with her son.  She has been having approximately 5 cc of what is described as a brown fluid draining into her cholecystostomy bag.  Earlier today, she had an episode of epigastric pain.  Currently, she endorses some lower central abdominal pain.  She has not had any worsening pain in her right upper quadrant.  She did, tonight, have onset of bloody output into her cholecystostomy bag as well as from around the cholecystostomy tube insertion site.  For this reason, she presents to the ED.     Home Medications Prior to Admission medications   Medication Sig Start Date End Date Taking? Authorizing Provider  albuterol (VENTOLIN HFA) 108 (90 Base) MCG/ACT inhaler INHALE 2 PUFFS BY MOUTH EVERY 4 HOURS AS NEEDED 10/06/22   Jetty Duhamel D, MD  amiodarone (PACERONE) 200 MG tablet Take 2 tablets (400 mg total) by mouth 2 (two) times daily for 5 days, THEN 1 tablet (200 mg total) daily for 21 days. Stop amiodarone after taking 200mg  daily for 21 days.. 10/27/22 11/22/22  Burnadette Pop, MD  ammonium lactate (LAC-HYDRIN) 12 % cream Apply 1 Application topically as needed for dry skin.  04/28/22   Ellsworth Lennox, PA-C  apixaban (ELIQUIS) 5 MG TABS tablet Take 1 tablet by mouth twice daily 07/26/22   Lanier Prude, MD  DUPIXENT 300 MG/2ML SOPN Inject 300 mg into the skin every 14 (fourteen) days. Patient not taking: Reported on 10/28/2022 10/30/21   [provider]  fluticasone-salmeterol (ADVAIR) 250-50 MCG/ACT AEPB INHALE 1 PUFF INTO LUNGS TWICE DAILY Patient taking differently: Inhale 1 puff into the lungs in the morning and at bedtime. 05/20/22   Jetty Duhamel D, MD  letrozole [facility]) 2.5 MG tablet Take 1 tablet by mouth once daily 10/08/22   Serena Croissant, MD  Multiple Vitamin (MULTIVITAMIN) tablet Take 1 tablet by mouth daily.    [provider]  pantoprazole (PROTONIX) 40 MG tablet Take 1 tablet (40 mg total) by mouth daily. 10/29/22   [name], MD  triamcinolone cream (KENALOG) 0.1 % Apply 1 Application topically 2 (two) times daily. 04/28/22   Ellsworth Lennox, PA-C      Allergies    Clarithromycin, Aspirin, Ibuprofen, Codeine, Codeine phosphate, Diltiazem, Methotrexate, Tape, Tapentadol, and Verapamil hcl er    Review of Systems   Review of Systems  Gastrointestinal:  Positive for abdominal pain.  Skin:  Positive for wound.  All other systems reviewed and are negative.   Physical Exam Updated Vital Signs BP 117/63   Pulse 63   Temp 98 F (36.7 C) (Oral)   Resp 16   Ht 5\' 11"  (1.803 m)  Wt 90 kg   SpO2 92%   BMI 27.67 kg/m  Physical Exam Vitals and nursing note reviewed.  Constitutional:      General: She is not in acute distress.    Appearance: Normal appearance. She is well-developed. She is not toxic-appearing or diaphoretic.  HENT:     Head: Normocephalic and atraumatic.     Right Ear: External ear normal.     Left Ear: External ear normal.     Nose: Nose normal.     Mouth/Throat:     Mouth: Mucous membranes are moist.  Eyes:     Extraocular Movements: Extraocular movements intact.     Conjunctiva/sclera: Conjunctivae  normal.  Cardiovascular:     Rate and Rhythm: Normal rate and regular rhythm.  Pulmonary:     Effort: Pulmonary effort is normal. No respiratory distress.  Abdominal:     General: There is no distension.     Palpations: Abdomen is soft.     Tenderness: There is abdominal tenderness. There is no guarding or rebound.  Musculoskeletal:        General: No swelling. Normal range of motion.     Cervical back: Normal range of motion and neck supple.     Right lower leg: No edema.     Left lower leg: No edema.  Skin:    General: Skin is warm and dry.     Coloration: Skin is not jaundiced or pale.  Neurological:     General: No focal deficit present.     Mental Status: She is alert and oriented to person, place, and time.     Cranial Nerves: No cranial nerve deficit.     Sensory: No sensory deficit.     Motor: No weakness.     Coordination: Coordination normal.  Psychiatric:        Mood and Affect: Mood normal.        Behavior: Behavior normal.        Thought Content: Thought content normal.        Judgment: Judgment normal.     ED Results / Procedures / Treatments   Labs (all labs ordered are listed, but only abnormal results are displayed) Labs Reviewed  COMPREHENSIVE METABOLIC PANEL - Abnormal; Notable for the following components:      Result Value   Glucose, Bld 111 (*)    Creatinine, Ser 1.02 (*)    Albumin 3.2 (*)    AST 43 (*)    GFR, Estimated 55 (*)    All other components within normal limits  CBC WITH DIFFERENTIAL/PLATELET - Abnormal; Notable for the following components:   Platelets 412 (*)    All other components within normal limits  PROTIME-INR - Abnormal; Notable for the following components:   Prothrombin Time 18.0 (*)    INR 1.5 (*)    All other components within normal limits  URINALYSIS, W/ REFLEX TO CULTURE (INFECTION SUSPECTED) - Abnormal; Notable for the following components:   Hgb urine dipstick SMALL (*)    Leukocytes,Ua TRACE (*)    Bacteria, UA  RARE (*)    All other components within normal limits  CULTURE, BLOOD (ROUTINE X 2)  CULTURE, BLOOD (ROUTINE X 2)  URINE CULTURE  LACTIC ACID, PLASMA  LACTIC ACID, PLASMA  CBC  COMPREHENSIVE METABOLIC PANEL    EKG None  Radiology CT CHEST ABDOMEN PELVIS W CONTRAST  Result Date: 11/02/2022 CLINICAL DATA:  Unintended weight loss. Drain tubing gallbladder that is now bleeding. EXAM: CT CHEST,  ABDOMEN, AND PELVIS WITH CONTRAST TECHNIQUE: Multidetector CT imaging of the chest, abdomen and pelvis was performed following the standard protocol during bolus administration of intravenous contrast. RADIATION DOSE REDUCTION: This exam was performed according to the departmental dose-optimization program which includes automated exposure control, adjustment of the mA and/or kV according to patient size and/or use of iterative reconstruction technique. CONTRAST:  75mL OMNIPAQUE IOHEXOL 350 MG/ML SOLN COMPARISON:  Abdominal ultrasound 11/01/2022; CT abdomen and pelvis 10/19/2022; CT chest 07/28/2006 FINDINGS: CT CHEST FINDINGS Cardiovascular: Mitral annular calcification. Coronary artery and aortic atherosclerotic calcification. Normal caliber aorta without dissection. Normal central pulmonary embolism. No pericardial effusion. Mediastinum/Nodes: Small hiatal hernia. Esophagus is otherwise unremarkable. No thoracic adenopathy. Lungs/Pleura: Emphysema. Bronchial wall thickening in the lower lobes with scattered mucous plugs. Clustered micro nodules in the left lower lobe. 5 mm subpleural nodule in the left lower lobe (5/92) stable since 2008 and benign. No follow-up recommended. Musculoskeletal: No acute fracture. CT ABDOMEN PELVIS FINDINGS Hepatobiliary: Cholecystostomy tube in the gallbladder. Multiple gallstones are present within the gallbladder. Gallbladder wall thickening and adjacent stranding, slightly improved from 10/19/2022. Tiny hepatic cysts. Liver is otherwise unremarkable. Pancreas: Fatty atrophy.  Unchanged 2.9 cm cystic lesion at the tail of the pancreas. Spleen: Unremarkable. Adrenals/Urinary Tract: Stable adrenal glands. Bilateral nephrolithiasis. Unchanged 4 mm stone in the mid right ureter. No hydronephrosis. Unremarkable bladder. Stomach/Bowel: Normal caliber large and small bowel. No bowel wall thickening. Normal appendix. Stomach is within normal limits. Vascular/Lymphatic: Aortic atherosclerosis. No enlarged abdominal or pelvic lymph nodes. Reproductive: Hysterectomy.  No adnexal mass. Other: Multiple soft tissue nodules in the presacral space measuring up to 2.0 cm (series 3/image 108). Musculoskeletal: No acute fracture. IMPRESSION: CHEST 1. Lower lobe predominant airway infection/inflammation. 2. Emphysema. ABDOMEN/PELVIS 1. Cholecystostomy tube in place within the gallbladder. 2. Cholelithiasis with slightly improved wall thickening and pericholecystic fluid compared with 10/19/2022. 3. Bilateral nephrolithiasis. Unchanged 4 mm stone in the mid right ureter. No hydronephrosis. 4. Multiple soft tissue nodules in the presacral space measuring up to 2.0 cm. These are indeterminate and malignancy is not excluded. Short interval follow-up in 3 months is recommended to ensure stability. 5. Unchanged 2.9 cm cystic lesion at the tail of the pancreas. Continued attention on follow-up. Aortic Atherosclerosis (ICD10-I70.0) and Emphysema (ICD10-J43.9). Electronically Signed   By: Minerva Fester M.D.   On: 11/02/2022 02:31   US Abdomen Limited RUQ (LIVER/GB)  Result Date: 11/01/2022 CLINICAL DATA:  Encounter for biliary drainage tubes EXAM: ULTRASOUND ABDOMEN LIMITED RIGHT UPPER QUADRANT COMPARISON:  CT abdomen and pelvis 10/19/2022 FINDINGS: Gallbladder: Gallbladder filled with stones. Mild wall thickening measuring 5 mm in thickness. Common bile duct: Diameter: 5 mm.  No intrahepatic biliary dilation. Liver: No focal lesion identified. Increased parenchymal echogenicity. Portal vein is patent on color  Doppler imaging with normal direction of blood flow towards the liver. Other: None. IMPRESSION: 1. Cholelithiasis with mild wall thickening of the gallbladder suggesting cholecystitis. 2. Hepatic steatosis. Electronically Signed   By: Minerva Fester M.D.   On: 11/01/2022 23:08   DG Chest 2 View  Result Date: 11/01/2022 CLINICAL DATA:  Encounter for suspected sepsis. EXAM: CHEST - 2 VIEW COMPARISON:  Portable chest 10/24/2022. FINDINGS: The cardiac size is normal, with again noted prominent left main pulmonary artery, stable mediastinum. There is mild aortic atherosclerosis. There are scattered atelectatic bands in both bases. On the left there is also streaky opacity in the lingula which could be due to atelectasis or a small pneumonia. Appearance of the frontal view  is similar to the prior study. On the lateral view, the lingular opacities were not seen on PA Lat chest 03/29/2022. The remaining lungs are clear.  No pleural collection is seen. Osteopenia and thoracic spondylosis. 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 IMPRESSION: Lingular opacity which could be atelectasis or a small pneumonia. Clinical correlation and radiographic follow-up recommended. Electronically Signed   By: Almira Bar M.D.   On: 11/01/2022 22:26    Procedures Procedures    Medications Ordered in ED Medications  doxycycline (VIBRA-TABS) tablet 100 mg (has no administration in time range)  ipratropium-albuterol (DUONEB) 0.5-2.5 (3) MG/3ML nebulizer solution 3 mL (has no administration in time range)  predniSONE (DELTASONE) tablet 60 mg (has no administration in time range)  apixaban (ELIQUIS) tablet 5 mg (has no administration in time range)  amiodarone (PACERONE) tablet 200 mg (has no administration in time range)  mometasone-formoterol (DULERA) 200-5 MCG/ACT inhaler 2 puff (has no administration in time range)  albuterol (PROVENTIL) (2.5 MG/3ML) 0.083% nebulizer solution 2.5 mg (has no administration in time  range)  pantoprazole (PROTONIX) EC tablet 40 mg (has no administration in time range)  oxyCODONE-acetaminophen (PERCOCET/ROXICET) 5-325 MG per tablet 1 tablet (1 tablet Oral Given 11/02/22 0116)  lactated ringers bolus 500 mL (0 mLs Intravenous Stopped 11/02/22 0403)  iohexol (OMNIPAQUE) 350 MG/ML injection 75 mL (75 mLs Intravenous Contrast Given 11/02/22 0156)  ondansetron (ZOFRAN) injection 4 mg (4 mg Intravenous Given 11/02/22 0209)  cefTRIAXone (ROCEPHIN) 1 g in sodium chloride 0.9 % 100 mL IVPB (1 g Intravenous New Bag/Given 11/02/22 0357)  ondansetron (ZOFRAN) injection 4 mg (4 mg Intravenous Given 11/02/22 0430)    ED Course/ Medical Decision Making/ A&P                             Medical Decision Making Amount and/or Complexity of Data Reviewed Labs: ordered. Radiology: ordered.  Risk Prescription drug management.   This patient presents to the ED for concern of bloody output from cholecystostomy, this involves an extensive number of treatment options, and is a complaint that carries with it a high risk of complications and morbidity.  The differential diagnosis includes worsening cholecystitis, postoperative complication, bleeding diathesis   Co morbidities that complicate the patient evaluation  atrial fibrillation, breast CA, CVA, CHF, HLD, asthma, and recent cholecystitis   Additional history obtained:  Additional history obtained from patient's sons External records from outside source obtained and reviewed including EMR   Lab Tests:  I Ordered, and personally interpreted labs.  The pertinent results include: Baseline hemoglobin, no leukocytosis, mild thrombocytosis, normal electrolytes, normal hepatobiliary enzymes   Imaging Studies ordered:  I ordered imaging studies including chest x-ray, right upper quadrant ultrasound, CT of chest, abdomen, pelvis I independently visualized and interpreted imaging which showed slightly improved gallbladder wall thickening,  persistent cholelithiasis, lower lobe airway infection/inflammation concerning for pneumonia I agree with the radiologist interpretation   Cardiac Monitoring: / EKG:  The patient was maintained on a cardiac monitor.  I personally viewed and interpreted the cardiac monitored which showed an underlying rhythm of: Sinus rhythm   Consultations Obtained:  I requested consultation with the general surgeon, Dr. Janee Morn,  and discussed lab and imaging findings as well as pertinent plan - they recommend: Patient will need to follow-up in office for any plans of cholecystectomy.  Bloody output is expected from time to time with patient being on anticoagulation.  Continued anticoagulation is recommended.  Problem List / ED Course / Critical interventions / Medication management  Patient presenting for new onset of bloody output from cholecystostomy site.  Cholecystostomy tube was placed recently during her hospital admission for cholecystitis.  She has completed her course of antibiotics.  On arrival in the ED, vital signs are normal.  On exam, patient is resting comfortably.  She does endorse current 5/10 severity pain in her abdomen which, currently, is described as in her lower central abdomen.  Percocet was ordered for analgesia.  Prior to being bedded in the ED, workup was initiated.  CBC does not show any new leukocytosis.  Hepatobiliary enzymes are normal.  Right upper quadrant ultrasound shows cholelithiasis with mild gallbladder wall thickening.  Given her other areas of recent abdominal pain, patient to undergo CT scan.  CT imaging showed mild improvement in cholecystitis.  I discussed with general surgeon on-call, Dr. Janee Morn, who does not recommend any surgical evaluation or intervention at this time.  Surgery is not concerned with recent bloody output, as this is expected with patient being on anticoagulation.  Following CT scan, patient had nausea.  Zofran was given.  SpO2 decreased into the  80s.  She was placed on 2 L of supplemental oxygen.  CT imaging is concerning for bibasilar pneumonia.  Empiric treatment for CAP was ordered.  Patient was admitted for further management. I ordered medication including IV fluids for hydration; Zofran for nausea; Percocet for analgesia; ceftriaxone and doxycycline for empiric treatment of pneumonia; DuoNeb and prednisone for treatment of reactive airway disease. Reevaluation of the patient after these medicines showed that the patient improved I have reviewed the patients home medicines and have made adjustments as needed   Social Determinants of Health:  Currently living at home with sons         Final Clinical Impression(s) / ED Diagnoses Final diagnoses:  Acute respiratory failure with hypoxia (HCC)  Pneumonia of both lower lobes due to infectious organism    Rx / DC Orders ED Discharge Orders     None         Gloris Manchester, MD 11/02/22 [phone]

## 2022-11-02 NOTE — Assessment & Plan Note (Addendum)
Pt in to ED with bleeding from cholecystostomy tube site. Pressure dressing applied and bleeding appears stopped at this point: No ongoing external bleeding No internal bleeding visible on CT scan, no hematoma etc HGB appears stable at 12.x on initial labs Cholecystostomy tube appears to be still in place in the gall bladder on CT scan per read Hold eliquis Repeat HGB Have sent message to Dr. Violeta Gelinas, he says that general surgery will see patient. Will defer management of cholecystostomy tube / gallbladder / plans for cholecystectomy to general surgery. Clear liquid diet for the moment LR at 100

## 2022-11-02 NOTE — ED Notes (Signed)
ED TO INPATIENT HANDOFF REPORT  ED Nurse Name and Phone #: Lowanda Foster [phone]  S Name/Age/Gender Laura Mendez 82 y.o. female Room/Bed: 017C/017C  Code Status   Code Status: Full Code  Home/SNF/Other Home Patient oriented to: self, place, time, and situation Is this baseline? Yes   Triage Complete: Triage complete  Chief Complaint Cholecystostomy tube dysfunction, initial encounter [T85.518A]  Triage Note Patient BIB POV  from home. Patient has a drain tube in gallbladder that is now bleeding from insertion site.   Drain tube placed on 10/21/22.  Patient reports increased pain internally. Patient denies new injury and denies pulling on tube. Notable serous sanguis fluid at site.    Allergies Allergies  Allergen Reactions   Clarithromycin Anaphylaxis    "just about killed me"   Aspirin Hives   Ibuprofen Hives   Codeine Nausea And Vomiting   Diltiazem Rash   Methotrexate Other (See Comments)     tongue swelling and mouth soreness   Tape Itching and Other (See Comments)    Tears skin   Tapentadol Itching   Verapamil Hcl Er Rash    Level of Care/Admitting Diagnosis ED Disposition     ED Disposition  Admit   Condition  --   Comment  Hospital Area: [facility] [[id number]]  Level of Care: Telemetry Medical [[id number]]  May place patient in observation at [facility] or [facility] if equivalent level of care is available:: No  Covid Evaluation: Asymptomatic - no recent exposure (last 10 days) testing not required  Diagnosis: Cholecystostomy tube dysfunction, initial encounter [[id number]]  Admitting Physician: Hillary Bow [[id number]]  Attending Physician: Hillary Bow [[id number]]          B Medical/Surgery History Past Medical History:  Diagnosis Date   Asthma    Breast cancer (HCC)    Chronic airway obstruction, not elsewhere classified    Diastolic dysfunction without heart failure    Disorder of bone and cartilage, unspecified     Dysrhythmia    A-Fib   Family history of colon cancer    Family history of kidney cancer    Family history of ovarian cancer    Paroxysmal atrial flutter (HCC)    Stroke (HCC)    TIA   TIA (transient ischemic attack)    Unspecified asthma(493.90)    Past Surgical History:  Procedure Laterality Date   ABDOMINAL HYSTERECTOMY     BREAST LUMPECTOMY Right 12/30/2017   BREAST LUMPECTOMY WITH RADIOACTIVE SEED LOCALIZATION Right 12/30/2017   Procedure: BREAST LUMPECTOMY WITH RADIOACTIVE SEED LOCALIZATION X'S 3;  Surgeon: Emelia Loron, MD;  Location: [facility] OR;  Service: General;  Laterality: Right;   BUNIONECTOMY Bilateral    EYE SURGERY Bilateral    cataract removal   IR PERC CHOLECYSTOSTOMY  10/21/2022   LEFT HEART CATH AND CORONARY ANGIOGRAPHY N/A 11/23/2021   Procedure: LEFT HEART CATH AND CORONARY ANGIOGRAPHY;  Surgeon: Tonny Bollman, MD;  Location: [facility] INVASIVE CV LAB;  Service: Cardiovascular;  Laterality: N/A;     A IV Location/Drains/Wounds Patient Lines/Drains/Airways Status     Active Line/Drains/Airways     Name Placement date Placement time Site Days   Peripheral IV 11/02/22 20 G Left Antecubital 11/02/22  0119  Antecubital  less than 1   Closed System Drain Superior;Right Abdomen  10 Fr. 10/21/22  1602  Abdomen  12   Wound / Incision (Open or Dehisced) 07/01/21 Knee Anterior;Left abrasion 1.3X1.5 07/01/21  1422  Knee  489   Wound /  Incision (Open or Dehisced) 07/01/21 (MASD) Moisture Associated Skin Damage Buttocks Right;Left 07/01/21  1430  Buttocks  489            Intake/Output Last 24 hours  Intake/Output Summary (Last 24 hours) at 11/02/2022 0535 Last data filed at 11/02/2022 0531 Gross per 24 hour  Intake 0.4 ml  Output --  Net 0.4 ml    Labs/Imaging Results for orders placed or performed during the hospital encounter of 11/01/22 (from the past 48 hour(s))  Comprehensive metabolic panel     Status: Abnormal   Collection Time: 11/01/22 10:22 PM  Result  Value Ref Range   Sodium 137 135 - 145 mmol/L   Potassium 3.9 3.5 - 5.1 mmol/L   Chloride 105 98 - 111 mmol/L   CO2 22 22 - 32 mmol/L   Glucose, Bld 111 (H) 70 - 99 mg/dL    Comment: Glucose reference range applies only to samples taken after fasting for at least 8 hours.   BUN 10 8 - 23 mg/dL   Creatinine, Ser 1.61 (H) 0.44 - 1.00 mg/dL   Calcium 9.2 8.9 - 09.6 mg/dL   Total Protein 6.6 6.5 - 8.1 g/dL   Albumin 3.2 (L) 3.5 - 5.0 g/dL   AST 43 (H) 15 - 41 U/L   ALT 21 0 - 44 U/L   Alkaline Phosphatase 75 38 - 126 U/L   Total Bilirubin 0.9 0.3 - 1.2 mg/dL   GFR, Estimated 55 (L) >60 mL/min    Comment: (NOTE) Calculated using the CKD-EPI Creatinine Equation (2021)    Anion gap 10 5 - 15    Comment: Performed at [facility], [address], [place], [place] [address]  Lactic acid, plasma     Status: None   Collection Time: 11/01/22 10:22 PM  Result Value Ref Range   Lactic Acid, Venous 0.9 0.5 - 1.9 mmol/L    Comment: Performed at [facility], [address] [place], [place] [address]  CBC with Differential     Status: Abnormal   Collection Time: 11/01/22 10:22 PM  Result Value Ref Range   WBC 9.7 4.0 - 10.5 K/uL   RBC 4.22 3.87 - 5.11 MIL/uL   Hemoglobin 12.1 12.0 - 15.0 g/dL   HCT 14.7 82.9 - 56.2 %   MCV 91.5 80.0 - 100.0 fL   MCH 28.7 26.0 - 34.0 pg   MCHC 31.3 30.0 - 36.0 g/dL   RDW 13.0 86.5 - 78.4 %   Platelets 412 (H) 150 - 400 K/uL   nRBC 0.0 0.0 - 0.2 %   Neutrophils Relative % 72 %   Neutro Abs 7.1 1.7 - 7.7 K/uL   Lymphocytes Relative 11 %   Lymphs Abs 1.1 0.7 - 4.0 K/uL   Monocytes Relative 10 %   Monocytes Absolute 0.9 0.1 - 1.0 K/uL   Eosinophils Relative 5 %   Eosinophils Absolute 0.4 0.0 - 0.5 K/uL   Basophils Relative 1 %   Basophils Absolute 0.1 0.0 - 0.1 K/uL   Immature Granulocytes 1 %   Abs Immature Granulocytes 0.07 0.00 - 0.07 K/uL    Comment: Performed at [facility], [address] [place], [place] [address]   Protime-INR     Status: Abnormal   Collection Time: 11/01/22 10:22 PM  Result Value Ref Range   Prothrombin Time 18.0 (H) 11.4 - 15.2 seconds   INR 1.5 (H) 0.8 - 1.2    Comment: (NOTE) INR  goal varies based on device and disease states. Performed at [facility], [address], [place], [place] [address]   Urinalysis, w/ Reflex to Culture (Infection Suspected) -Urine, Clean Catch     Status: Abnormal   Collection Time: 11/01/22 10:47 PM  Result Value Ref Range   Specimen Source URINE, CATHETERIZED    Color, Urine YELLOW YELLOW   APPearance CLEAR CLEAR   Specific Gravity, Urine 1.013 1.005 - 1.030   pH 6.0 5.0 - 8.0   Glucose, UA NEGATIVE NEGATIVE mg/dL   Hgb urine dipstick SMALL (A) NEGATIVE   Bilirubin Urine NEGATIVE NEGATIVE   Ketones, ur NEGATIVE NEGATIVE mg/dL   Protein, ur NEGATIVE NEGATIVE mg/dL   Nitrite NEGATIVE NEGATIVE   Leukocytes,Ua TRACE (A) NEGATIVE   RBC / HPF 21-50 0 - 5 RBC/hpf   WBC, UA 21-50 0 - 5 WBC/hpf    Comment:        Reflex urine culture not performed if WBC <=10, OR if Squamous epithelial cells >5. If Squamous epithelial cells >5 suggest recollection.    Bacteria, UA RARE (A) NONE SEEN   Squamous Epithelial / HPF 0-5 0 - 5 /HPF   Mucus PRESENT     Comment: Performed at [facility], [address] [place], [place] [address]  Lactic acid, plasma     Status: None   Collection Time: 11/02/22  1:45 AM  Result Value Ref Range   Lactic Acid, Venous 1.2 0.5 - 1.9 mmol/L    Comment: Performed at [facility], [address], [place], [place] [address]   CT CHEST ABDOMEN PELVIS W CONTRAST  Result Date: 11/02/2022 CLINICAL DATA:  Unintended weight loss. Drain tubing gallbladder that is now bleeding. EXAM: CT CHEST, ABDOMEN, AND PELVIS WITH CONTRAST TECHNIQUE: Multidetector CT imaging of the chest, abdomen and pelvis was performed following the standard protocol during bolus administration of intravenous contrast. RADIATION DOSE  REDUCTION: This exam was performed according to the departmental dose-optimization program which includes automated exposure control, adjustment of the mA and/or kV according to patient size and/or use of iterative reconstruction technique. CONTRAST:  75mL OMNIPAQUE IOHEXOL 350 MG/ML SOLN COMPARISON:  Abdominal ultrasound 11/01/2022; CT abdomen and pelvis 10/19/2022; CT chest 07/28/2006 FINDINGS: CT CHEST FINDINGS Cardiovascular: Mitral annular calcification. Coronary artery and aortic atherosclerotic calcification. Normal caliber aorta without dissection. Normal central pulmonary embolism. No pericardial effusion. Mediastinum/Nodes: Small hiatal hernia. Esophagus is otherwise unremarkable. No thoracic adenopathy. Lungs/Pleura: Emphysema. Bronchial wall thickening in the lower lobes with scattered mucous plugs. Clustered micro nodules in the left lower lobe. 5 mm subpleural nodule in the left lower lobe (5/92) stable since 2008 and benign. No follow-up recommended. Musculoskeletal: No acute fracture. CT ABDOMEN PELVIS FINDINGS Hepatobiliary: Cholecystostomy tube in the gallbladder. Multiple gallstones are present within the gallbladder. Gallbladder wall thickening and adjacent stranding, slightly improved from 10/19/2022. Tiny hepatic cysts. Liver is otherwise unremarkable. Pancreas: Fatty atrophy. Unchanged 2.9 cm cystic lesion at the tail of the pancreas. Spleen: Unremarkable. Adrenals/Urinary Tract: Stable adrenal glands. Bilateral nephrolithiasis. Unchanged 4 mm stone in the mid right ureter. No hydronephrosis. Unremarkable bladder. Stomach/Bowel: Normal caliber large and small bowel. No bowel wall thickening. Normal appendix. Stomach is within normal limits. Vascular/Lymphatic: Aortic atherosclerosis. No enlarged abdominal or pelvic lymph nodes. Reproductive: Hysterectomy.  No adnexal mass. Other: Multiple soft tissue nodules in the presacral space measuring up to 2.0 cm (series 3/image 108). Musculoskeletal:  No acute fracture. IMPRESSION: CHEST 1. Lower lobe predominant airway infection/inflammation. 2.  Emphysema. ABDOMEN/PELVIS 1. Cholecystostomy tube in place within the gallbladder. 2. Cholelithiasis with slightly improved wall thickening and pericholecystic fluid compared with 10/19/2022. 3. Bilateral nephrolithiasis. Unchanged 4 mm stone in the mid right ureter. No hydronephrosis. 4. Multiple soft tissue nodules in the presacral space measuring up to 2.0 cm. These are indeterminate and malignancy is not excluded. Short interval follow-up in 3 months is recommended to ensure stability. 5. Unchanged 2.9 cm cystic lesion at the tail of the pancreas. Continued attention on follow-up. Aortic Atherosclerosis (ICD10-I70.0) and Emphysema (ICD10-J43.9). Electronically Signed   By: Minerva Fester M.D.   On: 11/02/2022 02:31   US Abdomen Limited RUQ (LIVER/GB)  Result Date: 11/01/2022 CLINICAL DATA:  Encounter for biliary drainage tubes EXAM: ULTRASOUND ABDOMEN LIMITED RIGHT UPPER QUADRANT COMPARISON:  CT abdomen and pelvis 10/19/2022 FINDINGS: Gallbladder: Gallbladder filled with stones. Mild wall thickening measuring 5 mm in thickness. Common bile duct: Diameter: 5 mm.  No intrahepatic biliary dilation. Liver: No focal lesion identified. Increased parenchymal echogenicity. Portal vein is patent on color Doppler imaging with normal direction of blood flow towards the liver. Other: None. IMPRESSION: 1. Cholelithiasis with mild wall thickening of the gallbladder suggesting cholecystitis. 2. Hepatic steatosis. Electronically Signed   By: Minerva Fester M.D.   On: 11/01/2022 23:08   DG Chest 2 View  Result Date: 11/01/2022 CLINICAL DATA:  Encounter for suspected sepsis. EXAM: CHEST - 2 VIEW COMPARISON:  Portable chest 10/24/2022. FINDINGS: The cardiac size is normal, with again noted prominent left main pulmonary artery, stable mediastinum. There is mild aortic atherosclerosis. There are scattered atelectatic bands in  both bases. On the left there is also streaky opacity in the lingula which could be due to atelectasis or a small pneumonia. Appearance of the frontal view is similar to the prior study. On the lateral view, the lingular opacities were not seen on PA Lat chest 03/29/2022. The remaining lungs are clear.  No pleural collection is seen. Osteopenia and thoracic spondylosis. 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 IMPRESSION: Lingular opacity which could be atelectasis or a small pneumonia. Clinical correlation and radiographic follow-up recommended. Electronically Signed   By: Almira Bar M.D.   On: 11/01/2022 22:26    Pending Labs Unresulted Labs (From admission, onward)     Start     Ordered   11/02/22 0500  CBC  Tomorrow morning,   R        11/02/22 0455   11/02/22 0500  Comprehensive metabolic panel  Tomorrow morning,   R        11/02/22 0455   11/01/22 2247  Urine Culture  Once,   R        11/01/22 2247   11/01/22 2152  Culture, blood (Routine x 2)  BLOOD CULTURE X 2,   R (with STAT occurrences)      11/01/22 2151            Vitals/Pain Today's Vitals   11/02/22 0100 11/02/22 0112 11/02/22 0300 11/02/22 0530  BP: (!) 109/45  117/63 (!) 115/54  Pulse: 65  63 (!) 57  Resp: (!) 23  16 17   Temp:  98 F (36.7 C)  98 F (36.7 C)  TempSrc:  Oral  Oral  SpO2: 95%  92% 95%  Weight:      Height:      PainSc:    0-No pain    Isolation Precautions No active isolations  Medications Medications  doxycycline (VIBRA-TABS) tablet 100 mg (has no administration in  time range)  ipratropium-albuterol (DUONEB) 0.5-2.5 (3) MG/3ML nebulizer solution 3 mL (has no administration in time range)  predniSONE (DELTASONE) tablet 60 mg (has no administration in time range)  apixaban (ELIQUIS) tablet 5 mg (has no administration in time range)  amiodarone (PACERONE) tablet 200 mg (has no administration in time range)  mometasone-formoterol (DULERA) 200-5 MCG/ACT inhaler 2 puff (has no  administration in time range)  albuterol (PROVENTIL) (2.5 MG/3ML) 0.083% nebulizer solution 2.5 mg (has no administration in time range)  pantoprazole (PROTONIX) EC tablet 40 mg (has no administration in time range)  acetaminophen (TYLENOL) tablet 650 mg (has no administration in time range)    Or  acetaminophen (TYLENOL) suppository 650 mg (has no administration in time range)  ondansetron (ZOFRAN) tablet 4 mg (has no administration in time range)    Or  ondansetron (ZOFRAN) injection 4 mg (has no administration in time range)  cefTRIAXone (ROCEPHIN) 1 g in sodium chloride 0.9 % 100 mL IVPB (has no administration in time range)  oxyCODONE-acetaminophen (PERCOCET/ROXICET) 5-325 MG per tablet 1 tablet (1 tablet Oral Given 11/02/22 0116)  lactated ringers bolus 500 mL (0 mLs Intravenous Stopped 11/02/22 0403)  iohexol (OMNIPAQUE) 350 MG/ML injection 75 mL (75 mLs Intravenous Contrast Given 11/02/22 0156)  ondansetron (ZOFRAN) injection 4 mg (4 mg Intravenous Given 11/02/22 0209)  cefTRIAXone (ROCEPHIN) 1 g in sodium chloride 0.9 % 100 mL IVPB (0 g Intravenous Stopped 11/02/22 0531)  ondansetron (ZOFRAN) injection 4 mg (4 mg Intravenous Given 11/02/22 0430)    Mobility non-ambulatory     Focused Assessments Pulmonary Assessment Handoff:  Lung sounds: Bilateral Breath Sounds: Diminished O2 Device: Nasal Cannula O2 Flow Rate (L/min): 2 L/min    R Recommendations: See Admitting Provider Note  Report given to:   Additional Notes: Pt a/o x 4 pt no ambulatory, but normally no issues

## 2022-11-02 NOTE — Progress Notes (Signed)
Referring Provider(s): Trixie Deis  Supervising Physician: Mir, Mauri Reading  Patient Status:  [name] - In-pt  Chief Complaint:  Bleeding at Chole tube  Brief History:  Laura Mendez is a 82 y.o. female who was recently admitted from 5/28-6/5 with acute cholecystitis.    Cholecystostomy placed on 5/30 by Dr. Lowella Dandy.  She is on Eliquis for Afib.  Last night she noticed a sudden onset of bloody output into cholecystostomy bag as well as around the tube insertion site.  Came to ED actively bleeding.    HGB was 12.1 which is essentially unchanged from 6 days ago (12.2)   We are asked to evaluate her tube.  Allergies: Clarithromycin, Aspirin, Ibuprofen, Codeine, Diltiazem, Methotrexate, Tape, Tapentadol, and Verapamil hcl er  Medications: Prior to Admission medications   Medication Sig Start Date End Date Taking? Authorizing Provider  albuterol (VENTOLIN HFA) 108 (90 Base) MCG/ACT inhaler INHALE 2 PUFFS BY MOUTH EVERY 4 HOURS AS NEEDED Patient taking differently: Inhale 2 puffs into the lungs every 4 (four) hours as needed for wheezing or shortness of breath. 10/06/22  Yes Young, Joni Fears D, MD  amiodarone (PACERONE) 200 MG tablet Take 2 tablets (400 mg total) by mouth 2 (two) times daily for 5 days, THEN 1 tablet (200 mg total) daily for 21 days. Stop amiodarone after taking 200mg  daily for 21 days.. 10/27/22 11/22/22 Yes Burnadette Pop, MD  ammonium lactate (LAC-HYDRIN) 12 % cream Apply 1 Application topically as needed for dry skin. 04/28/22  Yes Ellsworth Lennox, PA-C  apixaban (ELIQUIS) 5 MG TABS tablet Take 1 tablet by mouth twice daily 07/26/22  Yes Lanier Prude, MD  DUPIXENT 300 MG/2ML SOPN Inject 300 mg into the skin every 14 (fourteen) days. 10/30/21  Yes [provider]  fluticasone-salmeterol (ADVAIR) 250-50 MCG/ACT AEPB INHALE 1 PUFF INTO LUNGS TWICE DAILY Patient taking differently: Inhale 1 puff into the lungs in the morning and at bedtime. 05/20/22  Yes Jetty Duhamel D, MD  letrozole [facility]) 2.5 MG tablet Take 1 tablet by mouth once daily 10/08/22  Yes Serena Croissant, MD  Multiple Vitamin (MULTIVITAMIN) tablet Take 1 tablet by mouth daily.   Yes [provider]  pantoprazole (PROTONIX) 40 MG tablet Take 1 tablet (40 mg total) by mouth daily. 10/29/22  Yes [name], MD  triamcinolone cream (KENALOG) 0.1 % Apply 1 Application topically 2 (two) times daily. 04/28/22  Yes Ellsworth Lennox, PA-C     Vital Signs: BP (!) 109/54 (BP Location: Right Arm)   Pulse (!) 56   Temp (!) 97.5 F (36.4 C) (Oral)   Resp 16   Ht 5\' 11"  (1.803 m)   Wt 198 lb 6.6 oz (90 kg)   SpO2 96%   BMI 27.67 kg/m   Physical Exam Vitals reviewed.  Constitutional:      Appearance: Normal appearance.  Cardiovascular:     Rate and Rhythm: Normal rate.  Pulmonary:     Effort: Pulmonary effort is normal. No respiratory distress.  Abdominal:     Palpations: Abdomen is soft.     Comments: Some blood in chole drain. Stat Lock clamp loose.  Neurological:     General: No focal deficit present.     Mental Status: She is alert and oriented to person, place, and time.  Psychiatric:        Mood and Affect: Mood normal.        Behavior: Behavior normal.        Thought Content: Thought  content normal.        Judgment: Judgment normal.      Labs:  CBC: Recent Labs    10/21/22 0640 10/22/22 0207 10/26/22 1133 11/01/22 2222  WBC 11.1* 10.4 12.4* 9.7  HGB 11.5* 11.0* 12.2 12.1  HCT 36.5 35.1* 38.6 38.6  PLT 192 201 475* 412*    COAGS: Recent Labs    10/19/22 1123 10/20/22 0225 10/20/22 2145 10/21/22 0207 10/21/22 0640 10/22/22 0207 11/01/22 2222  INR 2.0*  --   --   --   --   --  1.5*  APTT  --    < > 89* 157* 70* 69*  --    < > = values in this interval not displayed.    BMP: Recent Labs    10/23/22 0102 10/24/22 0127 10/26/22 1133 11/01/22 2222  NA 136 138 139 137  K 3.3* 3.6 3.9 3.9  CL 105 108 105 105  CO2 22 23 24 22    GLUCOSE 108* 110* 104* 111*  BUN 10 9 13 10   CALCIUM 8.3* 8.3* 8.8* 9.2  CREATININE 0.93 0.84 0.93 1.02*  GFRNONAA >60 >60 >60 55*    LIVER FUNCTION TESTS: Recent Labs    10/19/22 1123 10/22/22 0207 11/01/22 2222  BILITOT 1.2 0.3 0.9  AST 36 31 43*  ALT 20 21 21   ALKPHOS 77 44 75  PROT 7.0 4.8* 6.6  ALBUMIN 3.1* 1.9* 3.2*   CLINICAL DATA:  Unintended weight loss. Drain tubing gallbladder that is now bleeding.   EXAM: CT CHEST, ABDOMEN, AND PELVIS WITH CONTRAST   TECHNIQUE: Multidetector CT imaging of the chest, abdomen and pelvis was performed following the standard protocol during bolus administration of intravenous contrast.   RADIATION DOSE REDUCTION: This exam was performed according to the departmental dose-optimization program which includes automated exposure control, adjustment of the mA and/or kV according to patient size and/or use of iterative reconstruction technique.   CONTRAST:  75mL OMNIPAQUE IOHEXOL 350 MG/ML SOLN   COMPARISON:  Abdominal ultrasound 11/01/2022; CT abdomen and pelvis 10/19/2022; CT chest 07/28/2006   FINDINGS: CT CHEST FINDINGS   Cardiovascular: Mitral annular calcification. Coronary artery and aortic atherosclerotic calcification. Normal caliber aorta without dissection. Normal central pulmonary embolism. No pericardial effusion.   Mediastinum/Nodes: Small hiatal hernia. Esophagus is otherwise unremarkable. No thoracic adenopathy.   Lungs/Pleura: Emphysema. Bronchial wall thickening in the lower lobes with scattered mucous plugs. Clustered micro nodules in the left lower lobe. 5 mm subpleural nodule in the left lower lobe (5/92) stable since 2008 and benign. No follow-up recommended.   Musculoskeletal: No acute fracture.   CT ABDOMEN PELVIS FINDINGS   Hepatobiliary: Cholecystostomy tube in the gallbladder. Multiple gallstones are present within the gallbladder. Gallbladder wall thickening and adjacent stranding,  slightly improved from 10/19/2022. Tiny hepatic cysts. Liver is otherwise unremarkable.   Pancreas: Fatty atrophy. Unchanged 2.9 cm cystic lesion at the tail of the pancreas.   Spleen: Unremarkable.   Adrenals/Urinary Tract: Stable adrenal glands. Bilateral nephrolithiasis. Unchanged 4 mm stone in the mid right ureter. No hydronephrosis. Unremarkable bladder.   Stomach/Bowel: Normal caliber large and small bowel. No bowel wall thickening. Normal appendix. Stomach is within normal limits.   Vascular/Lymphatic: Aortic atherosclerosis. No enlarged abdominal or pelvic lymph nodes.   Reproductive: Hysterectomy.  No adnexal mass.   Other: Multiple soft tissue nodules in the presacral space measuring up to 2.0 cm (series 3/image 108).   Musculoskeletal: No acute fracture.   IMPRESSION: CHEST   1. Lower lobe  predominant airway infection/inflammation. 2. Emphysema.   ABDOMEN/PELVIS   1. Cholecystostomy tube in place within the gallbladder. 2. Cholelithiasis with slightly improved wall thickening and pericholecystic fluid compared with 10/19/2022. 3. Bilateral nephrolithiasis. Unchanged 4 mm stone in the mid right ureter. No hydronephrosis. 4. Multiple soft tissue nodules in the presacral space measuring up to 2.0 cm. These are indeterminate and malignancy is not excluded. Short interval follow-up in 3 months is recommended to ensure stability. 5. Unchanged 2.9 cm cystic lesion at the tail of the pancreas. Continued attention on follow-up.   Aortic Atherosclerosis (ICD10-I70.0) and Emphysema (ICD10-J43.9).     Electronically Signed   By: Minerva Fester M.D.   On: 11/02/2022 02:31  Assessment and Plan:  Blood in perc chole - likely from Eliquis but will proceed with cholangiogram and possible exchange today.  Risks and benefits discussed with the patient including, but not limited to bleeding, infection, gallbladder perforation, bile leak, sepsis or even death.  All of  the patient's questions were answered, patient is agreeable to proceed. Consent signed and in chart.   Electronically Signed: Gwynneth Macleod, PA-C 11/02/2022, 9:05 AM    I spent a total of 15 Minutes at the the patient's bedside AND on the patient's hospital floor or unit, greater than 50% of which was counseling/coordinating care for cholangiogram.

## 2022-11-03 DIAGNOSIS — J9601 Acute respiratory failure with hypoxia: Secondary | ICD-10-CM

## 2022-11-03 DIAGNOSIS — J189 Pneumonia, unspecified organism: Secondary | ICD-10-CM | POA: Diagnosis not present

## 2022-11-03 LAB — COMPREHENSIVE METABOLIC PANEL
ALT: 121 U/L — ABNORMAL HIGH (ref 0–44)
AST: 184 U/L — ABNORMAL HIGH (ref 15–41)
Albumin: 3 g/dL — ABNORMAL LOW (ref 3.5–5.0)
Alkaline Phosphatase: 182 U/L — ABNORMAL HIGH (ref 38–126)
Anion gap: 13 (ref 5–15)
BUN: 10 mg/dL (ref 8–23)
CO2: 24 mmol/L (ref 22–32)
Calcium: 9 mg/dL (ref 8.9–10.3)
Chloride: 103 mmol/L (ref 98–111)
Creatinine, Ser: 0.96 mg/dL (ref 0.44–1.00)
GFR, Estimated: 59 mL/min — ABNORMAL LOW (ref >60)
Glucose, Bld: 117 mg/dL — ABNORMAL HIGH (ref 70–99)
Potassium: 4.4 mmol/L (ref 3.5–5.1)
Sodium: 140 mmol/L (ref 135–145)
Total Bilirubin: 0.6 mg/dL (ref 0.3–1.2)
Total Protein: 6.2 g/dL — ABNORMAL LOW (ref 6.5–8.1)

## 2022-11-03 LAB — CBC
HCT: 39.9 % (ref 36.0–46.0)
Hemoglobin: 12 g/dL (ref 12.0–15.0)
MCH: 28 pg (ref 26.0–34.0)
MCHC: 30.1 g/dL (ref 30.0–36.0)
MCV: 93 fL (ref 80.0–100.0)
Platelets: 384 10*3/uL (ref 150–400)
RBC: 4.29 MIL/uL (ref 3.87–5.11)
RDW: 14.7 % (ref 11.5–15.5)
WBC: 9.1 10*3/uL (ref 4.0–10.5)
nRBC: 0 % (ref 0.0–0.2)

## 2022-11-03 LAB — URINE CULTURE

## 2022-11-03 NOTE — Progress Notes (Addendum)
Progress Note     Subjective: Pt sitting on the side of the bed this AM and appears to feel much better. She denies abdominal pain or nausea this AM and wants some more to eat. She is still having some bloody drainage in perc chole bag but no drainage around new tube. She would like to shower today. Her son is at the bedside.   Objective: Vital signs in last 24 hours: Temp:  [97.5 F (36.4 C)-98.1 F (36.7 C)] 97.6 F (36.4 C) (06/12 0739) Pulse Rate:  [60-64] 63 (06/12 0739) Resp:  [16-20] 16 (06/12 0739) BP: (106-128)/(58-72) 106/70 (06/12 0739) SpO2:  [95 %-100 %] 98 % (06/12 0816) Last BM Date : 11/01/22  Intake/Output from previous day: 06/11 0701 - 06/12 0700 In: 1430.2 [I.V.:1430.2] Out: 28 [Drains:28] Intake/Output this shift: No intake/output data recorded.  PE: General: pleasant, WD, elderly female who is laying in bed in NAD HEENT: sclera anicteric  Heart: regular, rate, and rhythm.   Lungs:  Respiratory effort nonlabored Abd: soft, NT, ND, drain with bloody fluid  Psych: A&Ox3 with an appropriate affect.    Lab Results:  Recent Labs    11/02/22 0145 11/03/22 0727  WBC 11.1* 9.1  HGB 11.0* 12.0  HCT 35.8* 39.9  PLT 381 384    BMET Recent Labs    11/02/22 0145 11/03/22 0727  NA 135 140  K 4.3 4.4  CL 105 103  CO2 20* 24  GLUCOSE 118* 117*  BUN 10 10  CREATININE 0.95 0.96  CALCIUM 8.8* 9.0    PT/INR Recent Labs    11/01/22 2222  LABPROT 18.0*  INR 1.5*    CMP     Component Value Date/Time   NA 140 11/03/2022 0727   NA 144 07/22/2022 0844   K 4.4 11/03/2022 0727   CL 103 11/03/2022 0727   CO2 24 11/03/2022 0727   GLUCOSE 117 (H) 11/03/2022 0727   GLUCOSE 111 (H) 03/04/2006 1034   BUN 10 11/03/2022 0727   BUN 16 07/22/2022 0844   CREATININE 0.96 11/03/2022 0727   CREATININE 0.96 11/16/2017 0812   CALCIUM 9.0 11/03/2022 0727   PROT 6.2 (L) 11/03/2022 0727   ALBUMIN 3.0 (L) 11/03/2022 0727   AST 184 (H) 11/03/2022 0727    AST 27 11/16/2017 0812   ALT 121 (H) 11/03/2022 0727   ALT 23 11/16/2017 0812   ALKPHOS 182 (H) 11/03/2022 0727   BILITOT 0.6 11/03/2022 0727   BILITOT 0.5 11/16/2017 0812   GFRNONAA 59 (L) 11/03/2022 0727   GFRNONAA 56 (L) 11/16/2017 0812   GFRAA >60 12/22/2017 1402   GFRAA >60 11/16/2017 0812   Lipase  No results found for: "LIPASE"     Studies/Results: IR EXCHANGE BILIARY DRAIN  Result Date: 11/02/2022 INDICATION: 82 year old woman underwent cholecystostomy drain placement on 10/21/2022 for acute calculus cholecystitis. She developed bloody output within the bag and from around the catheter entry site last night. She presents today for evaluation. EXAM: Cholecystostomy drain exchange and upsize MEDICATIONS: None ANESTHESIA/SEDATION: None FLUOROSCOPY: Radiation Exposure Index (as provided by the fluoroscopic device): 8 mGy Kerma COMPLICATIONS: None immediate. PROCEDURE: Informed written consent was obtained from the patient after a thorough discussion of the procedural risks, benefits and alternatives. All questions were addressed. Maximal Sterile Barrier Technique was utilized including caps, mask, sterile gowns, sterile gloves, sterile drape, hand hygiene and skin antiseptic. A timeout was performed prior to the initiation of the procedure. Scout image showed right upper quadrant cholecystostomy drain  in appropriate position. Intraluminal positioning within the gallbladder was confirmed by administering contrast under fluoroscopy. Given slow oozing seen along the catheter entry site, decision made to upsize the drain. Following local administration, the existing drain was cut and removed over 0.035 inch guidewire and replaced with a 12 [place] drain (previously 10 [place]). Contrast administered through the new drain confirmed appropriate positioning within the gallbladder lumen. The drain was secured to skin with suture and connected to bag. IMPRESSION: Successful upsize of cholecystostomy  drain from 10 [place] to 12 [place] in order to tamponade slow hemorrhage along catheter tract. Electronically Signed   By: Acquanetta Belling M.D.   On: 11/02/2022 16:16   CT CHEST ABDOMEN PELVIS W CONTRAST  Result Date: 11/02/2022 CLINICAL DATA:  Unintended weight loss. Drain tubing gallbladder that is now bleeding. EXAM: CT CHEST, ABDOMEN, AND PELVIS WITH CONTRAST TECHNIQUE: Multidetector CT imaging of the chest, abdomen and pelvis was performed following the standard protocol during bolus administration of intravenous contrast. RADIATION DOSE REDUCTION: This exam was performed according to the departmental dose-optimization program which includes automated exposure control, adjustment of the mA and/or kV according to patient size and/or use of iterative reconstruction technique. CONTRAST:  75mL OMNIPAQUE IOHEXOL 350 MG/ML SOLN COMPARISON:  Abdominal ultrasound 11/01/2022; CT abdomen and pelvis 10/19/2022; CT chest 07/28/2006 FINDINGS: CT CHEST FINDINGS Cardiovascular: Mitral annular calcification. Coronary artery and aortic atherosclerotic calcification. Normal caliber aorta without dissection. Normal central pulmonary embolism. No pericardial effusion. Mediastinum/Nodes: Small hiatal hernia. Esophagus is otherwise unremarkable. No thoracic adenopathy. Lungs/Pleura: Emphysema. Bronchial wall thickening in the lower lobes with scattered mucous plugs. Clustered micro nodules in the left lower lobe. 5 mm subpleural nodule in the left lower lobe (5/92) stable since 2008 and benign. No follow-up recommended. Musculoskeletal: No acute fracture. CT ABDOMEN PELVIS FINDINGS Hepatobiliary: Cholecystostomy tube in the gallbladder. Multiple gallstones are present within the gallbladder. Gallbladder wall thickening and adjacent stranding, slightly improved from 10/19/2022. Tiny hepatic cysts. Liver is otherwise unremarkable. Pancreas: Fatty atrophy. Unchanged 2.9 cm cystic lesion at the tail of the pancreas. Spleen:  Unremarkable. Adrenals/Urinary Tract: Stable adrenal glands. Bilateral nephrolithiasis. Unchanged 4 mm stone in the mid right ureter. No hydronephrosis. Unremarkable bladder. Stomach/Bowel: Normal caliber large and small bowel. No bowel wall thickening. Normal appendix. Stomach is within normal limits. Vascular/Lymphatic: Aortic atherosclerosis. No enlarged abdominal or pelvic lymph nodes. Reproductive: Hysterectomy.  No adnexal mass. Other: Multiple soft tissue nodules in the presacral space measuring up to 2.0 cm (series 3/image 108). Musculoskeletal: No acute fracture. IMPRESSION: CHEST 1. Lower lobe predominant airway infection/inflammation. 2. Emphysema. ABDOMEN/PELVIS 1. Cholecystostomy tube in place within the gallbladder. 2. Cholelithiasis with slightly improved wall thickening and pericholecystic fluid compared with 10/19/2022. 3. Bilateral nephrolithiasis. Unchanged 4 mm stone in the mid right ureter. No hydronephrosis. 4. Multiple soft tissue nodules in the presacral space measuring up to 2.0 cm. These are indeterminate and malignancy is not excluded. Short interval follow-up in 3 months is recommended to ensure stability. 5. Unchanged 2.9 cm cystic lesion at the tail of the pancreas. Continued attention on follow-up. Aortic Atherosclerosis (ICD10-I70.0) and Emphysema (ICD10-J43.9). Electronically Signed   By: Minerva Fester M.D.   On: 11/02/2022 02:31   US Abdomen Limited RUQ (LIVER/GB)  Result Date: 11/01/2022 CLINICAL DATA:  Encounter for biliary drainage tubes EXAM: ULTRASOUND ABDOMEN LIMITED RIGHT UPPER QUADRANT COMPARISON:  CT abdomen and pelvis 10/19/2022 FINDINGS: Gallbladder: Gallbladder filled with stones. Mild wall thickening measuring 5 mm in thickness. Common bile duct: Diameter: 5 mm.  No intrahepatic biliary dilation. Liver: No focal lesion identified. Increased parenchymal echogenicity. Portal vein is patent on color Doppler imaging with normal direction of blood flow towards the  liver. Other: None. IMPRESSION: 1. Cholelithiasis with mild wall thickening of the gallbladder suggesting cholecystitis. 2. Hepatic steatosis. Electronically Signed   By: Minerva Fester M.D.   On: 11/01/2022 23:08   DG Chest 2 View  Result Date: 11/01/2022 CLINICAL DATA:  Encounter for suspected sepsis. EXAM: CHEST - 2 VIEW COMPARISON:  Portable chest 10/24/2022. FINDINGS: The cardiac size is normal, with again noted prominent left main pulmonary artery, stable mediastinum. There is mild aortic atherosclerosis. There are scattered atelectatic bands in both bases. On the left there is also streaky opacity in the lingula which could be due to atelectasis or a small pneumonia. Appearance of the frontal view is similar to the prior study. On the lateral view, the lingular opacities were not seen on PA Lat chest 03/29/2022. The remaining lungs are clear.  No pleural collection is seen. Osteopenia and thoracic spondylosis. 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 IMPRESSION: Lingular opacity which could be atelectasis or a small pneumonia. Clinical correlation and radiographic follow-up recommended. Electronically Signed   By: Almira Bar M.D.   On: 11/01/2022 22:26    Anti-infectives: Anti-infectives (From admission, onward)    Start     Dose/Rate Route Frequency Ordered Stop   11/03/22 0400  cefTRIAXone (ROCEPHIN) 1 g in sodium chloride 0.9 % 100 mL IVPB        1 g 200 mL/hr over 30 Minutes Intravenous Every 24 hours 11/02/22 0507     11/02/22 0900  doxycycline (VIBRA-TABS) tablet 100 mg        100 mg Oral  Once 11/02/22 0813 11/02/22 0833   11/02/22 0315  cefTRIAXone (ROCEPHIN) 1 g in sodium chloride 0.9 % 100 mL IVPB        1 g 200 mL/hr over 30 Minutes Intravenous  Once 11/02/22 0309 11/02/22 0531   11/02/22 0315  doxycycline (VIBRA-TABS) tablet 100 mg  Status:  Discontinued        100 mg Oral  Once 11/02/22 0309 11/02/22 0813        Assessment/Plan  Acute cholecystitis  - s/p IR  perc chole drain placement 5/30 - Cxs with Hafnia Alvei and Strep sanguinis - drain upsized yesterday  - appears clinically much better today, leukocytosis improved, LFTs mildly elevated, hgb 12 from 11 - ok to have HH diet today  - ok to shower  - no plans for emergent surgical intervention but we will follow    FEN: HH diet  VTE: ok with Heparin gtt, hold Eliquis ID: rocephin 6/11>   - per TRH -  Paroxysmal A. Fib on Eliquis - last dose yesterday AM Breast cancer Chronic diastolic CHF Hx of CVA   LOS: 1 day   I reviewed Consultant IR notes, hospitalist notes, last 24 h vitals and pain scores, last 48 h intake and output, last 24 h labs and trends, and last 24 h imaging results.   Juliet Rude, [facility] 11/03/2022, 8:42 AM Please see Amion for pager number during day hours 7:00am-4:30pm

## 2022-11-03 NOTE — Progress Notes (Signed)
  Progress Note   Patient: Laura Mendez WUJ:[id number] DOB: 27-Mar-1941 DOA: 11/01/2022     1 DOS: the patient was seen and examined on 11/03/2022   Brief hospital course: 82 y.o. female with medical history significant of asthma / COPD, PAF on eliquis, prior stroke / TIA. Pt presented with bloody output into recently placed cholecystostomy bag following recent admission for acute cholecystitis  Assessment and Plan: Cholecystostomy tube dysfunction -General Surgery following -Eliquis remains on hold -Hgb remains stable -IR consulted and pt is now s/p upsizing of perc chole drain 6/11 -Per IR, possibility of resuming eliquis 6/13 if stable   Acute bronchitis Seen on CT scan O2 requirement initially, but weaned to room air -Continued on home scheduled and PRN nebs -continued on empiric rocephin   Paroxysmal atrial fibrillation (HCC) Cont amiodarone Rate controlled Eliquis on hold per above, per IR, may resume 6/13   Asthma with COPD (HCC) Cont home scheduled and PRN nebs. On minimal O2 support No audible wheezing   Subjective: Reports feeing better. Tolerating diet  Physical Exam: Vitals:   11/03/22 0357 11/03/22 0739 11/03/22 0816 11/03/22 1604  BP: 106/72 106/70  113/71  Pulse: 63 63  (!) 53  Resp: 20 16  16   Temp: (!) 97.5 F (36.4 C) 97.6 F (36.4 C)  97.6 F (36.4 C)  TempSrc: Oral Oral  Oral  SpO2: 98% 100% 98% 95%  Weight:      Height:       General exam: Awake, sitting in bed, in nad Respiratory system: Normal respiratory effort, no wheezing Cardiovascular system: regular rate, s1, s2 Gastrointestinal system: Soft, nondistended, positive BS Central nervous system: CN2-12 grossly intact, strength intact Extremities: Perfused, no clubbing Skin: Normal skin turgor, no notable skin lesions seen Psychiatry: Mood normal // no visual hallucinations   Data Reviewed:  Labs reviewed: na 140, K 4.4, Cr 0.96, WBC 9.1, Hg 12.0  Family Communication: pt in  room, family not at bedside  Disposition: Status is: Inpatient Remains inpatient appropriate because: Severity of illness  Planned Discharge Destination: Home    Author: Rickey Barbara, MD 11/03/2022 5:11 PM  For on call review [email or web address].

## 2022-11-03 NOTE — Progress Notes (Signed)
Chief Complaint: Patient was seen today for perc chole drain  Supervising Physician: Roanna Banning  Patient Status: [facility] - In-pt  Subjective: S/p perc chole drain exchange/upsize yesterday. Pt reports no further bleeding from skin site, just bloody output in bag. She reports she also feels better overall this am.  Objective: Physical Exam: BP 106/70 (BP Location: Right Arm)   Pulse 63   Temp 97.6 F (36.4 C) (Oral)   Resp 16   Ht 5\' 11"  (1.803 m)   Wt 198 lb 6.6 oz (90 kg)   SpO2 98%   BMI 27.67 kg/m  RUQ perc chole drain intact, site clean, dry. No evidence of bleeding. Output in bag bloody. Drain flushes easily, return of thin bloody fluid.   Current Facility-Administered Medications:    acetaminophen (TYLENOL) tablet 650 mg, 650 mg, Oral, Q6H PRN **OR** acetaminophen (TYLENOL) suppository 650 mg, 650 mg, Rectal, Q6H PRN, Julian Reil, Jared M, DO   albuterol (PROVENTIL) (2.5 MG/3ML) 0.083% nebulizer solution 2.5 mg, 2.5 mg, Inhalation, Q4H PRN, Julian Reil, Jared M, DO   amiodarone (PACERONE) tablet 200 mg, 200 mg, Oral, Daily, Julian Reil, Jared M, DO   cefTRIAXone (ROCEPHIN) 1 g in sodium chloride 0.9 % 100 mL IVPB, 1 g, Intravenous, Q24H, Julian Reil, Jared M, DO, Last Rate: 200 mL/hr at 11/03/22 0402, 1 g at 11/03/22 0402   ipratropium-albuterol (DUONEB) 0.5-2.5 (3) MG/3ML nebulizer solution 3 mL, 3 mL, Nebulization, Once, Gloris Manchester, MD   lactated ringers infusion, , Intravenous, Continuous, Juliet Rude, PA-C, Last Rate: 10 mL/hr at 11/03/22 0856, Rate Change at 11/03/22 0856   mometasone-formoterol (DULERA) 200-5 MCG/ACT inhaler 2 puff, 2 puff, Inhalation, BID, Hillary Bow, DO, 2 puff at 11/03/22 0816   ondansetron (ZOFRAN) tablet 4 mg, 4 mg, Oral, Q6H PRN **OR** ondansetron (ZOFRAN) injection 4 mg, 4 mg, Intravenous, Q6H PRN, Julian Reil, Jared M, DO, 4 mg at 11/02/22 0641   pantoprazole (PROTONIX) EC tablet 40 mg, 40 mg, Oral, Daily, Lyda Perone M, DO, 40 mg at 11/03/22  0856  Labs: CBC Recent Labs    11/02/22 0145 11/03/22 0727  WBC 11.1* 9.1  HGB 11.0* 12.0  HCT 35.8* 39.9  PLT 381 384   BMET Recent Labs    11/02/22 0145 11/03/22 0727  NA 135 140  K 4.3 4.4  CL 105 103  CO2 20* 24  GLUCOSE 118* 117*  BUN 10 10  CREATININE 0.95 0.96  CALCIUM 8.8* 9.0   LFT Recent Labs    11/03/22 0727  PROT 6.2*  ALBUMIN 3.0*  AST 184*  ALT 121*  ALKPHOS 182*  BILITOT 0.6   PT/INR Recent Labs    11/01/22 2222  LABPROT 18.0*  INR 1.5*     Studies/Results: IR EXCHANGE BILIARY DRAIN  Result Date: 11/02/2022 INDICATION: 82 year old woman underwent cholecystostomy drain placement on 10/21/2022 for acute calculus cholecystitis. She developed bloody output within the bag and from around the catheter entry site last night. She presents today for evaluation. EXAM: Cholecystostomy drain exchange and upsize MEDICATIONS: None ANESTHESIA/SEDATION: None FLUOROSCOPY: Radiation Exposure Index (as provided by the fluoroscopic device): 8 mGy Kerma COMPLICATIONS: None immediate. PROCEDURE: Informed written consent was obtained from the patient after a thorough discussion of the procedural risks, benefits and alternatives. All questions were addressed. Maximal Sterile Barrier Technique was utilized including caps, mask, sterile gowns, sterile gloves, sterile drape, hand hygiene and skin antiseptic. A timeout was performed prior to the initiation of the procedure. Scout image showed right upper quadrant cholecystostomy  drain in appropriate position. Intraluminal positioning within the gallbladder was confirmed by administering contrast under fluoroscopy. Given slow oozing seen along the catheter entry site, decision made to upsize the drain. Following local administration, the existing drain was cut and removed over 0.035 inch guidewire and replaced with a 12 [place] drain (previously 10 [place]). Contrast administered through the new drain confirmed appropriate  positioning within the gallbladder lumen. The drain was secured to skin with suture and connected to bag. IMPRESSION: Successful upsize of cholecystostomy drain from 10 [place] to 12 [place] in order to tamponade slow hemorrhage along catheter tract. Electronically Signed   By: Acquanetta Belling M.D.   On: 11/02/2022 16:16   CT CHEST ABDOMEN PELVIS W CONTRAST  Result Date: 11/02/2022 CLINICAL DATA:  Unintended weight loss. Drain tubing gallbladder that is now bleeding. EXAM: CT CHEST, ABDOMEN, AND PELVIS WITH CONTRAST TECHNIQUE: Multidetector CT imaging of the chest, abdomen and pelvis was performed following the standard protocol during bolus administration of intravenous contrast. RADIATION DOSE REDUCTION: This exam was performed according to the departmental dose-optimization program which includes automated exposure control, adjustment of the mA and/or kV according to patient size and/or use of iterative reconstruction technique. CONTRAST:  75mL OMNIPAQUE IOHEXOL 350 MG/ML SOLN COMPARISON:  Abdominal ultrasound 11/01/2022; CT abdomen and pelvis 10/19/2022; CT chest 07/28/2006 FINDINGS: CT CHEST FINDINGS Cardiovascular: Mitral annular calcification. Coronary artery and aortic atherosclerotic calcification. Normal caliber aorta without dissection. Normal central pulmonary embolism. No pericardial effusion. Mediastinum/Nodes: Small hiatal hernia. Esophagus is otherwise unremarkable. No thoracic adenopathy. Lungs/Pleura: Emphysema. Bronchial wall thickening in the lower lobes with scattered mucous plugs. Clustered micro nodules in the left lower lobe. 5 mm subpleural nodule in the left lower lobe (5/92) stable since 2008 and benign. No follow-up recommended. Musculoskeletal: No acute fracture. CT ABDOMEN PELVIS FINDINGS Hepatobiliary: Cholecystostomy tube in the gallbladder. Multiple gallstones are present within the gallbladder. Gallbladder wall thickening and adjacent stranding, slightly improved from 10/19/2022.  Tiny hepatic cysts. Liver is otherwise unremarkable. Pancreas: Fatty atrophy. Unchanged 2.9 cm cystic lesion at the tail of the pancreas. Spleen: Unremarkable. Adrenals/Urinary Tract: Stable adrenal glands. Bilateral nephrolithiasis. Unchanged 4 mm stone in the mid right ureter. No hydronephrosis. Unremarkable bladder. Stomach/Bowel: Normal caliber large and small bowel. No bowel wall thickening. Normal appendix. Stomach is within normal limits. Vascular/Lymphatic: Aortic atherosclerosis. No enlarged abdominal or pelvic lymph nodes. Reproductive: Hysterectomy.  No adnexal mass. Other: Multiple soft tissue nodules in the presacral space measuring up to 2.0 cm (series 3/image 108). Musculoskeletal: No acute fracture. IMPRESSION: CHEST 1. Lower lobe predominant airway infection/inflammation. 2. Emphysema. ABDOMEN/PELVIS 1. Cholecystostomy tube in place within the gallbladder. 2. Cholelithiasis with slightly improved wall thickening and pericholecystic fluid compared with 10/19/2022. 3. Bilateral nephrolithiasis. Unchanged 4 mm stone in the mid right ureter. No hydronephrosis. 4. Multiple soft tissue nodules in the presacral space measuring up to 2.0 cm. These are indeterminate and malignancy is not excluded. Short interval follow-up in 3 months is recommended to ensure stability. 5. Unchanged 2.9 cm cystic lesion at the tail of the pancreas. Continued attention on follow-up. Aortic Atherosclerosis (ICD10-I70.0) and Emphysema (ICD10-J43.9). Electronically Signed   By: Minerva Fester M.D.   On: 11/02/2022 02:31   US Abdomen Limited RUQ (LIVER/GB)  Result Date: 11/01/2022 CLINICAL DATA:  Encounter for biliary drainage tubes EXAM: ULTRASOUND ABDOMEN LIMITED RIGHT UPPER QUADRANT COMPARISON:  CT abdomen and pelvis 10/19/2022 FINDINGS: Gallbladder: Gallbladder filled with stones. Mild wall thickening measuring 5 mm in thickness. Common bile duct: Diameter: 5 mm.  No intrahepatic biliary dilation. Liver: No focal lesion  identified. Increased parenchymal echogenicity. Portal vein is patent on color Doppler imaging with normal direction of blood flow towards the liver. Other: None. IMPRESSION: 1. Cholelithiasis with mild wall thickening of the gallbladder suggesting cholecystitis. 2. Hepatic steatosis. Electronically Signed   By: Minerva Fester M.D.   On: 11/01/2022 23:08   DG Chest 2 View  Result Date: 11/01/2022 CLINICAL DATA:  Encounter for suspected sepsis. EXAM: CHEST - 2 VIEW COMPARISON:  Portable chest 10/24/2022. FINDINGS: The cardiac size is normal, with again noted prominent left main pulmonary artery, stable mediastinum. There is mild aortic atherosclerosis. There are scattered atelectatic bands in both bases. On the left there is also streaky opacity in the lingula which could be due to atelectasis or a small pneumonia. Appearance of the frontal view is similar to the prior study. On the lateral view, the lingular opacities were not seen on PA Lat chest 03/29/2022. The remaining lungs are clear.  No pleural collection is seen. Osteopenia and thoracic spondylosis. 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 IMPRESSION: Lingular opacity which could be atelectasis or a small pneumonia. Clinical correlation and radiographic follow-up recommended. Electronically Signed   By: Almira Bar M.D.   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 No further bleeding from skin tract, expect output to slowly clear and become standard bilious If possible, would probably hold Eliquis another day, resume 6/13.   LOS: 1 day   I spent a total of 20 minutes in face to face in clinical consultation, greater than 50% of which was counseling/coordinating care for perc chole drain exchange  Brayton El PA-C 11/03/2022 10:40 AM

## 2022-11-03 NOTE — Hospital Course (Signed)
82 y.o. female with medical history significant of asthma / COPD, PAF on eliquis, prior stroke / TIA. Pt presented with bloody output into recently placed cholecystostomy bag following recent admission for acute cholecystitis

## 2022-11-03 NOTE — Telephone Encounter (Signed)
She is currently admitted again. Ok for verbal only if F2F with me within 30 days of start of service.

## 2022-11-04 DIAGNOSIS — J189 Pneumonia, unspecified organism: Secondary | ICD-10-CM | POA: Diagnosis not present

## 2022-11-04 DIAGNOSIS — J9601 Acute respiratory failure with hypoxia: Secondary | ICD-10-CM | POA: Diagnosis not present

## 2022-11-04 LAB — COMPREHENSIVE METABOLIC PANEL
ALT: 96 U/L — ABNORMAL HIGH (ref 0–44)
AST: 93 U/L — ABNORMAL HIGH (ref 15–41)
Albumin: 2.9 g/dL — ABNORMAL LOW (ref 3.5–5.0)
Alkaline Phosphatase: 153 U/L — ABNORMAL HIGH (ref 38–126)
Anion gap: 10 (ref 5–15)
BUN: 17 mg/dL (ref 8–23)
CO2: 25 mmol/L (ref 22–32)
Calcium: 8.7 mg/dL — ABNORMAL LOW (ref 8.9–10.3)
Chloride: 104 mmol/L (ref 98–111)
Creatinine, Ser: 1.04 mg/dL — ABNORMAL HIGH (ref 0.44–1.00)
GFR, Estimated: 54 mL/min — ABNORMAL LOW (ref >60)
Glucose, Bld: 101 mg/dL — ABNORMAL HIGH (ref 70–99)
Potassium: 3.9 mmol/L (ref 3.5–5.1)
Sodium: 139 mmol/L (ref 135–145)
Total Bilirubin: 0.7 mg/dL (ref 0.3–1.2)
Total Protein: 6 g/dL — ABNORMAL LOW (ref 6.5–8.1)

## 2022-11-04 LAB — CBC
HCT: 36.4 % (ref 36.0–46.0)
Hemoglobin: 11.1 g/dL — ABNORMAL LOW (ref 12.0–15.0)
MCH: 29.1 pg (ref 26.0–34.0)
MCHC: 30.5 g/dL (ref 30.0–36.0)
MCV: 95.3 fL (ref 80.0–100.0)
Platelets: 328 10*3/uL (ref 150–400)
RBC: 3.82 MIL/uL — ABNORMAL LOW (ref 3.87–5.11)
RDW: 15 % (ref 11.5–15.5)
WBC: 7.8 10*3/uL (ref 4.0–10.5)
nRBC: 0 % (ref 0.0–0.2)

## 2022-11-04 LAB — CULTURE, BLOOD (ROUTINE X 2)

## 2022-11-04 MED ORDER — CALCIUM CARBONATE ANTACID 500 MG PO CHEW
400.0000 mg | CHEWABLE_TABLET | Freq: Four times a day (QID) | ORAL | Status: DC | PRN
  Administered 2022-11-04: 400 mg via ORAL
  Filled 2022-11-04: qty 2

## 2022-11-04 NOTE — Progress Notes (Signed)
  Progress Note   Patient: Laura Mendez ZOX:[id number] DOB: 02-20-1941 DOA: 11/01/2022     2 DOS: the patient was seen and examined on 11/04/2022   Brief hospital course: 82 y.o. female with medical history significant of asthma / COPD, PAF on eliquis, prior stroke / TIA. Pt presented with bloody output into recently placed cholecystostomy bag following recent admission for acute cholecystitis  Assessment and Plan: Cholecystostomy tube dysfunction -General Surgery following -Eliquis remains on hold -Hgb currently remains stable -IR consulted and pt is now s/p upsizing of perc chole drain 6/11 -Pt still has blood fluid in bag. Deferring to IR as to when to resume eliquis   Acute bronchitis Seen on CT scan O2 requirement initially, but weaned to room air -Continued on home scheduled and PRN nebs -continued on empiric rocephin   Paroxysmal atrial fibrillation (HCC) Cont amiodarone Rate controlled Eliquis on hold per above, defer to IR as to when to resume   Asthma with COPD (HCC) Cont home scheduled and PRN nebs. On minimal O2 support No audible wheezing   Subjective: Without complaints this AM  Physical Exam: Vitals:   11/04/22 0549 11/04/22 0736 11/04/22 0833 11/04/22 1540  BP: 110/64 135/83  (!) 116/93  Pulse: (!) 57 65 72 61  Resp:  17  18  Temp: 98 F (36.7 C) (!) 97.5 F (36.4 C)  (!) 97.4 F (36.3 C)  TempSrc: Oral     SpO2: 95% 93%  95%  Weight:      Height:       General exam: Conversant, in no acute distress Respiratory system: normal chest rise, clear, no audible wheezing Cardiovascular system: regular rhythm, s1-s2 Gastrointestinal system: Nondistended, nontender, pos BS Central nervous system: No seizures, no tremors Extremities: No cyanosis, no joint deformities Skin: No rashes, no pallor Psychiatry: Affect normal // no auditory hallucinations   Data Reviewed:  Labs reviewed: Na 139, K 3.9, Cr 1.04, Hgb 11.1  Family Communication: pt in  room, family not at bedside  Disposition: Status is: Inpatient Remains inpatient appropriate because: Severity of illness  Planned Discharge Destination: Home    Author: Rickey Barbara, MD 11/04/2022 6:30 PM  For on call review [email or web address].

## 2022-11-04 NOTE — Progress Notes (Signed)
Progress Note     Subjective: Pt sitting on the side of the bed this AM and reports feeling well. She reports eating some of each part of breakfast. Denies abdominal pain. Bloody fluid in drain.   Objective: Vital signs in last 24 hours: Temp:  [97.5 F (36.4 C)-98.4 F (36.9 C)] 97.5 F (36.4 C) (06/13 0736) Pulse Rate:  [53-72] 72 (06/13 0833) Resp:  [16-17] 17 (06/13 0736) BP: (110-135)/(63-83) 135/83 (06/13 0736) SpO2:  [93 %-95 %] 93 % (06/13 0736) Last BM Date : 11/01/22  Intake/Output from previous day: 06/12 0701 - 06/13 0700 In: 1307.5 [I.V.:1102.5; IV Piggyback:200] Out: 60 [Drains:60] Intake/Output this shift: No intake/output data recorded.  PE: General: pleasant, WD, elderly female who is sitting up in bed in NAD HEENT: sclera anicteric  Heart: regular, rate, and rhythm.   Lungs:  Respiratory effort nonlabored Abd: soft, NT, ND, drain with bloody fluid  Psych: A&Ox3 with an appropriate affect.    Lab Results:  Recent Labs    11/03/22 0727 11/04/22 0003  WBC 9.1 7.8  HGB 12.0 11.1*  HCT 39.9 36.4  PLT 384 328    BMET Recent Labs    11/03/22 0727 11/04/22 0003  NA 140 139  K 4.4 3.9  CL 103 104  CO2 24 25  GLUCOSE 117* 101*  BUN 10 17  CREATININE 0.96 1.04*  CALCIUM 9.0 8.7*    PT/INR Recent Labs    11/01/22 2222  LABPROT 18.0*  INR 1.5*    CMP     Component Value Date/Time   NA 139 11/04/2022 0003   NA 144 07/22/2022 0844   K 3.9 11/04/2022 0003   CL 104 11/04/2022 0003   CO2 25 11/04/2022 0003   GLUCOSE 101 (H) 11/04/2022 0003   GLUCOSE 111 (H) 03/04/2006 1034   BUN 17 11/04/2022 0003   BUN 16 07/22/2022 0844   CREATININE 1.04 (H) 11/04/2022 0003   CREATININE 0.96 11/16/2017 0812   CALCIUM 8.7 (L) 11/04/2022 0003   PROT 6.0 (L) 11/04/2022 0003   ALBUMIN 2.9 (L) 11/04/2022 0003   AST 93 (H) 11/04/2022 0003   AST 27 11/16/2017 0812   ALT 96 (H) 11/04/2022 0003   ALT 23 11/16/2017 0812   ALKPHOS 153 (H) 11/04/2022  0003   BILITOT 0.7 11/04/2022 0003   BILITOT 0.5 11/16/2017 0812   GFRNONAA 54 (L) 11/04/2022 0003   GFRNONAA 56 (L) 11/16/2017 0812   GFRAA >60 12/22/2017 1402   GFRAA >60 11/16/2017 0812   Lipase  No results found for: "LIPASE"     Studies/Results: IR EXCHANGE BILIARY DRAIN  Result Date: 11/02/2022 INDICATION: 82 year old woman underwent cholecystostomy drain placement on 10/21/2022 for acute calculus cholecystitis. She developed bloody output within the bag and from around the catheter entry site last night. She presents today for evaluation. EXAM: Cholecystostomy drain exchange and upsize MEDICATIONS: None ANESTHESIA/SEDATION: None FLUOROSCOPY: Radiation Exposure Index (as provided by the fluoroscopic device): 8 mGy Kerma COMPLICATIONS: None immediate. PROCEDURE: Informed written consent was obtained from the patient after a thorough discussion of the procedural risks, benefits and alternatives. All questions were addressed. Maximal Sterile Barrier Technique was utilized including caps, mask, sterile gowns, sterile gloves, sterile drape, hand hygiene and skin antiseptic. A timeout was performed prior to the initiation of the procedure. Scout image showed right upper quadrant cholecystostomy drain in appropriate position. Intraluminal positioning within the gallbladder was confirmed by administering contrast under fluoroscopy. Given slow oozing seen along the catheter entry site,  decision made to upsize the drain. Following local administration, the existing drain was cut and removed over 0.035 inch guidewire and replaced with a 12 [place] drain (previously 10 [place]). Contrast administered through the new drain confirmed appropriate positioning within the gallbladder lumen. The drain was secured to skin with suture and connected to bag. IMPRESSION: Successful upsize of cholecystostomy drain from 10 [place] to 12 [place] in order to tamponade slow hemorrhage along catheter tract. Electronically  Signed   By: Acquanetta Belling M.D.   On: 11/02/2022 16:16    Anti-infectives: Anti-infectives (From admission, onward)    Start     Dose/Rate Route Frequency Ordered Stop   11/03/22 0400  cefTRIAXone (ROCEPHIN) 1 g in sodium chloride 0.9 % 100 mL IVPB        1 g 200 mL/hr over 30 Minutes Intravenous Every 24 hours 11/02/22 0507     11/02/22 0900  doxycycline (VIBRA-TABS) tablet 100 mg        100 mg Oral  Once 11/02/22 0813 11/02/22 0833   11/02/22 0315  cefTRIAXone (ROCEPHIN) 1 g in sodium chloride 0.9 % 100 mL IVPB        1 g 200 mL/hr over 30 Minutes Intravenous  Once 11/02/22 0309 11/02/22 0531   11/02/22 0315  doxycycline (VIBRA-TABS) tablet 100 mg  Status:  Discontinued        100 mg Oral  Once 11/02/22 0309 11/02/22 0813        Assessment/Plan  Acute cholecystitis  - s/p IR perc chole drain placement 5/30 - 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 Fib on Eliquis - last dose 6/9 AM Breast cancer Chronic diastolic CHF Hx of CVA   LOS: 2 days   I reviewed Consultant IR notes, hospitalist notes, last 24 h vitals and pain scores, last 48 h intake and output, and last 24 h labs and trends.   Juliet Rude, [facility] 11/04/2022, 9:25 AM Please see Amion for pager number during day hours 7:00am-4:30pm

## 2022-11-05 ENCOUNTER — Other Ambulatory Visit: Payer: Self-pay | Admitting: Radiology

## 2022-11-05 DIAGNOSIS — K810 Acute cholecystitis: Secondary | ICD-10-CM

## 2022-11-05 DIAGNOSIS — J9601 Acute respiratory failure with hypoxia: Secondary | ICD-10-CM | POA: Diagnosis not present

## 2022-11-05 DIAGNOSIS — J189 Pneumonia, unspecified organism: Secondary | ICD-10-CM | POA: Diagnosis not present

## 2022-11-05 LAB — COMPREHENSIVE METABOLIC PANEL
ALT: 64 U/L — ABNORMAL HIGH (ref 0–44)
AST: 40 U/L (ref 15–41)
Albumin: 2.8 g/dL — ABNORMAL LOW (ref 3.5–5.0)
Alkaline Phosphatase: 138 U/L — ABNORMAL HIGH (ref 38–126)
Anion gap: 13 (ref 5–15)
BUN: 14 mg/dL (ref 8–23)
CO2: 22 mmol/L (ref 22–32)
Calcium: 9.2 mg/dL (ref 8.9–10.3)
Chloride: 105 mmol/L (ref 98–111)
Creatinine, Ser: 1.18 mg/dL — ABNORMAL HIGH (ref 0.44–1.00)
GFR, Estimated: 46 mL/min — ABNORMAL LOW (ref >60)
Glucose, Bld: 87 mg/dL (ref 70–99)
Potassium: 4.2 mmol/L (ref 3.5–5.1)
Sodium: 140 mmol/L (ref 135–145)
Total Bilirubin: 0.5 mg/dL (ref 0.3–1.2)
Total Protein: 5.8 g/dL — ABNORMAL LOW (ref 6.5–8.1)

## 2022-11-05 LAB — CBC
HCT: 36.5 % (ref 36.0–46.0)
Hemoglobin: 11 g/dL — ABNORMAL LOW (ref 12.0–15.0)
MCH: 28.4 pg (ref 26.0–34.0)
MCHC: 30.1 g/dL (ref 30.0–36.0)
MCV: 94.3 fL (ref 80.0–100.0)
Platelets: 304 10*3/uL (ref 150–400)
RBC: 3.87 MIL/uL (ref 3.87–5.11)
RDW: 14.8 % (ref 11.5–15.5)
WBC: 7.4 10*3/uL (ref 4.0–10.5)
nRBC: 0 % (ref 0.0–0.2)

## 2022-11-05 LAB — CULTURE, BLOOD (ROUTINE X 2)

## 2022-11-05 MED ORDER — APIXABAN 5 MG PO TABS
5.0000 mg | ORAL_TABLET | Freq: Two times a day (BID) | ORAL | Status: DC
  Administered 2022-11-05 – 2022-11-08 (×6): 5 mg via ORAL
  Filled 2022-11-05 (×6): qty 1

## 2022-11-05 NOTE — Care Management Important Message (Signed)
Important Message  Patient Details  Name: Anshu Staal MRN: [id number] Date of Birth: 07/17/1940   Medicare Important Message Given:  Yes     Sherilyn Banker 11/05/2022, 4:30 PM

## 2022-11-05 NOTE — Plan of Care (Signed)

## 2022-11-05 NOTE — Progress Notes (Signed)
Progress Note     Subjective: Pt getting up to chair this AM. Drain still bloody but more SS overall today, small sediment in bag that actually appears to maybe be very small gallstones draining out. She is tolerating diet and working to increase PO intake. Denies pain, nausea or vomiting.   Objective: Vital signs in last 24 hours: Temp:  [97.3 F (36.3 C)-98.4 F (36.9 C)] 97.3 F (36.3 C) (06/14 0958) Pulse Rate:  [55-61] 55 (06/14 0958) Resp:  [18] 18 (06/14 0958) BP: (114-139)/(67-97) 114/97 (06/14 0958) SpO2:  [93 %-95 %] 94 % (06/14 0958) Last BM Date : 11/01/22  Intake/Output from previous day: 06/13 0701 - 06/14 0700 In: 250 [P.O.:240] Out: 55 [Drains:55] Intake/Output this shift: No intake/output data recorded.  PE: General: pleasant, WD, elderly female who is sitting up in NAD HEENT: sclera anicteric  Heart: regular, rate, and rhythm.   Lungs:  Respiratory effort nonlabored Abd: soft, NT, ND, drain with SS fluid and small dark particles that are likely gallstones  Psych: A&Ox3 with an appropriate affect.    Lab Results:  Recent Labs    11/04/22 0003 11/05/22 0011  WBC 7.8 7.4  HGB 11.1* 11.0*  HCT 36.4 36.5  PLT 328 304    BMET Recent Labs    11/04/22 0003 11/05/22 0011  NA 139 140  K 3.9 4.2  CL 104 105  CO2 25 22  GLUCOSE 101* 87  BUN 17 14  CREATININE 1.04* 1.18*  CALCIUM 8.7* 9.2    PT/INR No results for input(s): "LABPROT", "INR" in the last 72 hours.  CMP     Component Value Date/Time   NA 140 11/05/2022 0011   NA 144 07/22/2022 0844   K 4.2 11/05/2022 0011   CL 105 11/05/2022 0011   CO2 22 11/05/2022 0011   GLUCOSE 87 11/05/2022 0011   GLUCOSE 111 (H) 03/04/2006 1034   BUN 14 11/05/2022 0011   BUN 16 07/22/2022 0844   CREATININE 1.18 (H) 11/05/2022 0011   CREATININE 0.96 11/16/2017 0812   CALCIUM 9.2 11/05/2022 0011   PROT 5.8 (L) 11/05/2022 0011   ALBUMIN 2.8 (L) 11/05/2022 0011   AST 40 11/05/2022 0011   AST 27  11/16/2017 0812   ALT 64 (H) 11/05/2022 0011   ALT 23 11/16/2017 0812   ALKPHOS 138 (H) 11/05/2022 0011   BILITOT 0.5 11/05/2022 0011   BILITOT 0.5 11/16/2017 0812   GFRNONAA 46 (L) 11/05/2022 0011   GFRNONAA 56 (L) 11/16/2017 0812   GFRAA >60 12/22/2017 1402   GFRAA >60 11/16/2017 1610   Lipase  No results found for: "LIPASE"     Studies/Results: No results found.  Anti-infectives: Anti-infectives (From admission, onward)    Start     Dose/Rate Route Frequency Ordered Stop   11/03/22 0400  cefTRIAXone (ROCEPHIN) 1 g in sodium chloride 0.9 % 100 mL IVPB        1 g 200 mL/hr over 30 Minutes Intravenous Every 24 hours 11/02/22 0507     11/02/22 0900  doxycycline (VIBRA-TABS) tablet 100 mg        100 mg Oral  Once 11/02/22 0813 11/02/22 0833   11/02/22 0315  cefTRIAXone (ROCEPHIN) 1 g in sodium chloride 0.9 % 100 mL IVPB        1 g 200 mL/hr over 30 Minutes Intravenous  Once 11/02/22 0309 11/02/22 0531   11/02/22 0315  doxycycline (VIBRA-TABS) tablet 100 mg  Status:  Discontinued  100 mg Oral  Once 11/02/22 0309 11/02/22 0813        Assessment/Plan  Acute cholecystitis  - s/p IR perc chole drain placement 5/30 - Cxs with Hafnia Alvei and Strep sanguinis - drain upsized 6/11 - leukocytosis resolved, LFTs improving, hgb 11, stable  - ok to continue [facility] diet  - ok to shower  - no plans for emergent surgical intervention - follow up in the office as planned on 6/28. General surgery will sign off at this time   FEN: HH diet  VTE: hgb stable - defer when to restart eliquis to TRH/IR  ID: rocephin 6/11>   - per TRH -  Paroxysmal A. Fib on Eliquis - last dose 6/9 AM Breast cancer Chronic diastolic CHF Hx of CVA   LOS: 3 days   I reviewed Consultant IR notes, hospitalist notes, last 24 h vitals and pain scores, last 48 h intake and output, and last 24 h labs and trends.   Juliet Rude, [facility] 11/05/2022, 10:24 AM Please see Amion for  pager number during day hours 7:00am-4:30pm

## 2022-11-05 NOTE — Consult Note (Signed)
   [facility] CM Inpatient Consult   11/05/2022  Laura Mendez 1940-08-24 [id number]  [facility] [[facility]]  Accountable Care Organization [ACO] Patient: [name] Medicare  Primary Care Provider:  [name], MD with [place] [facility] which is listed to provide the transition of care follow up.  Patient is currently active with [name] [[facility] Care Management for chronic disease management services.  Patient has been engaged by a [facility] TOC RN and was scheduled for follow up with a [facility] RN CC. Met with patient at the bedside.  Patient is up in recliner, son at the bedside [on the phone], explained reason for the visit and given an appointment reminder card for follow up with PCP.  Has an upcoming appointment she states she may need to change it. Explained patient can have a follow up call from outpatient [facility] nurse.    Plan: Post hospital follow up with [facility] RNCM and to follow up for any post hospital care coordination needs.  Of note, [facility] Care Management services does not replace or interfere with any services that are needed or arranged by inpatient [facility] care management team.   For additional questions or referrals please contact:  Charlesetta Shanks, RN BSN CCM [facility]  [phone] business mobile phone Toll free office [phone]  *Concierge Line  [phone] Fax number: [phone] [email or web address]

## 2022-11-05 NOTE — Progress Notes (Signed)
  Progress Note   Patient: Laura Mendez ZOX:[id number] DOB: [birth year] DOA: 11/01/2022     3 DOS: the patient was seen and examined on 11/05/2022   Brief hospital course: 82 y.o. female with medical history significant of asthma / COPD, PAF on eliquis, prior stroke / TIA. Pt presented with bloody output into recently placed cholecystostomy bag following recent admission for acute cholecystitis  Assessment and Plan: Cholecystostomy tube dysfunction -General Surgery had been following -Eliquis had been on hold. Discussed with IR who states pt can resume eliquis today -Hgb has remained stable -Pt is now s/p upsizing of perc chole drain 6/11 -Per IR, fluid in bag is transitioning to more serosanguanous   Acute bronchitis Seen on CT scan O2 requirement initially, but weaned to room air -Continued on home scheduled and PRN nebs -continued on empiric rocephin   Paroxysmal atrial fibrillation (HCC) Cont amiodarone Rate controlled Discussed with IR. OK to resume eliquis today   Asthma with COPD (HCC) Cont home scheduled and PRN nebs. On minimal O2 support No audible wheezing   Subjective: No complaints today  Physical Exam: Vitals:   11/04/22 2051 11/05/22 0616 11/05/22 0958 11/05/22 1546  BP: 129/67 139/73 (!) 114/97 130/68  Pulse: (!) 57 (!) 56 (!) 55 (!) 56  Resp:   18   Temp: 98.4 F (36.9 C) 98.1 F (36.7 C) (!) 97.3 F (36.3 C) 97.7 F (36.5 C)  TempSrc: Oral Oral    SpO2: 93% 93% 94% 95%  Weight:      Height:       General exam: Awake, laying in bed, in nad Respiratory system: Normal respiratory effort, no wheezing Cardiovascular system: regular rate, s1, s2 Gastrointestinal system: Soft, nondistended, positive BS Central nervous system: CN2-12 grossly intact, strength intact Extremities: Perfused, no clubbing Skin: Normal skin turgor, no notable skin lesions seen Psychiatry: Mood normal // no visual hallucinations   Data Reviewed:  Labs reviewed: Na  140, K 4.2, Cr 1.18, Hgb 11.0  Family Communication: pt in room, family at bedside  Disposition: Status is: Inpatient Remains inpatient appropriate because: Severity of illness  Planned Discharge Destination: Home    Author: Rickey Barbara, MD 11/05/2022 5:20 PM  For on call review [email or web address].

## 2022-11-05 NOTE — Progress Notes (Signed)
Referring Physician(s): Adin Hector PA  Supervising Physician: Gilmer Mor  Patient Status:  [facility] - In-pt  Chief Complaint:  Acute calculus cholecystitis. Cholecystostomy tub exchanged and up-sized on 6.11.24    Subjective:  Son at bedside. Patient states that she is feeling better. Reports that the output in the bag looks less bloody then it did yesterday. All questions and concerns  of the Patient's and her son regarding the chole tube were answered at this time  Allergies: Clarithromycin, Aspirin, Ibuprofen, Codeine, Diltiazem, Methotrexate, Tape, Tapentadol, and Verapamil hcl er  Medications: Prior to Admission medications   Medication Sig Start Date End Date Taking? Authorizing Provider  albuterol (VENTOLIN HFA) 108 (90 Base) MCG/ACT inhaler INHALE 2 PUFFS BY MOUTH EVERY 4 HOURS AS NEEDED Patient taking differently: Inhale 2 puffs into the lungs every 4 (four) hours as needed for wheezing or shortness of breath. 10/06/22  Yes Young, Joni Fears D, MD  amiodarone (PACERONE) 200 MG tablet Take 2 tablets (400 mg total) by mouth 2 (two) times daily for 5 days, THEN 1 tablet (200 mg total) daily for 21 days. Stop amiodarone after taking 200mg  daily for 21 days.. 10/27/22 11/22/22 Yes Burnadette Pop, MD  ammonium lactate (LAC-HYDRIN) 12 % cream Apply 1 Application topically as needed for dry skin. 04/28/22  Yes Ellsworth Lennox, PA-C  apixaban (ELIQUIS) 5 MG TABS tablet Take 1 tablet by mouth twice daily 07/26/22  Yes Lanier Prude, MD  DUPIXENT 300 MG/2ML SOPN Inject 300 mg into the skin every 14 (fourteen) days. 10/30/21  Yes [provider]  fluticasone-salmeterol (ADVAIR) 250-50 MCG/ACT AEPB INHALE 1 PUFF INTO LUNGS TWICE DAILY Patient taking differently: Inhale 1 puff into the lungs in the morning and at bedtime. 05/20/22  Yes Jetty Duhamel D, MD  letrozole [facility]) 2.5 MG tablet Take 1 tablet by mouth once daily 10/08/22  Yes Serena Croissant, MD  Multiple Vitamin (MULTIVITAMIN)  tablet Take 1 tablet by mouth daily.   Yes [provider]  pantoprazole (PROTONIX) 40 MG tablet Take 1 tablet (40 mg total) by mouth daily. 10/29/22  Yes [name], MD  triamcinolone cream (KENALOG) 0.1 % Apply 1 Application topically 2 (two) times daily. 04/28/22  Yes Ellsworth Lennox, PA-C     Vital Signs: BP 130/68 (BP Location: Right Arm)   Pulse (!) 56   Temp 97.7 F (36.5 C)   Resp 18   Ht 5\' 11"  (1.803 m)   Wt 198 lb 6.6 oz (90 kg)   SpO2 95%   BMI 27.67 kg/m   Physical Exam Vitals and nursing note reviewed.  Constitutional:      Appearance: She is well-developed.  HENT:     Head: Normocephalic and atraumatic.  Eyes:     Conjunctiva/sclera: Conjunctivae normal.  Pulmonary:     Effort: Pulmonary effort is normal.  Abdominal:     Comments: Positive RUQ  drain to gravity bag. Site is unremarkable with no erythema, edema, tenderness, bleeding or drainage noted at exit site. Suture and stat lock in place. Dressing is clean dry and intact. 50 ml of bloody transitioning to serosanguinous colored fluid noted in gravity bag. Drain is able to be flushed easily. No leakage or pain with flushing.  Insertion site clean and dry.     Musculoskeletal:        General: Normal range of motion.     Cervical back: Normal range of motion.  Skin:    General: Skin is warm.  Neurological:  Mental Status: She is alert and oriented to person, place, and time.     Imaging: IR EXCHANGE BILIARY DRAIN  Result Date: 11/02/2022 INDICATION: 82 year old woman underwent cholecystostomy drain placement on 10/21/2022 for acute calculus cholecystitis. She developed bloody output within the bag and from around the catheter entry site last night. She presents today for evaluation. EXAM: Cholecystostomy drain exchange and upsize MEDICATIONS: None ANESTHESIA/SEDATION: None FLUOROSCOPY: Radiation Exposure Index (as provided by the fluoroscopic device): 8 mGy Kerma COMPLICATIONS: None  immediate. PROCEDURE: Informed written consent was obtained from the patient after a thorough discussion of the procedural risks, benefits and alternatives. All questions were addressed. Maximal Sterile Barrier Technique was utilized including caps, mask, sterile gowns, sterile gloves, sterile drape, hand hygiene and skin antiseptic. A timeout was performed prior to the initiation of the procedure. Scout image showed right upper quadrant cholecystostomy drain in appropriate position. Intraluminal positioning within the gallbladder was confirmed by administering contrast under fluoroscopy. Given slow oozing seen along the catheter entry site, decision made to upsize the drain. Following local administration, the existing drain was cut and removed over 0.035 inch guidewire and replaced with a 12 [place] drain (previously 10 [place]). Contrast administered through the new drain confirmed appropriate positioning within the gallbladder lumen. The drain was secured to skin with suture and connected to bag. IMPRESSION: Successful upsize of cholecystostomy drain from 10 [place] to 12 [place] in order to tamponade slow hemorrhage along catheter tract. Electronically Signed   By: Acquanetta Belling M.D.   On: 11/02/2022 16:16   CT CHEST ABDOMEN PELVIS W CONTRAST  Result Date: 11/02/2022 CLINICAL DATA:  Unintended weight loss. Drain tubing gallbladder that is now bleeding. EXAM: CT CHEST, ABDOMEN, AND PELVIS WITH CONTRAST TECHNIQUE: Multidetector CT imaging of the chest, abdomen and pelvis was performed following the standard protocol during bolus administration of intravenous contrast. RADIATION DOSE REDUCTION: This exam was performed according to the departmental dose-optimization program which includes automated exposure control, adjustment of the mA and/or kV according to patient size and/or use of iterative reconstruction technique. CONTRAST:  75mL OMNIPAQUE IOHEXOL 350 MG/ML SOLN COMPARISON:  Abdominal ultrasound 11/01/2022;  CT abdomen and pelvis 10/19/2022; CT chest 07/28/2006 FINDINGS: CT CHEST FINDINGS Cardiovascular: Mitral annular calcification. Coronary artery and aortic atherosclerotic calcification. Normal caliber aorta without dissection. Normal central pulmonary embolism. No pericardial effusion. Mediastinum/Nodes: Small hiatal hernia. Esophagus is otherwise unremarkable. No thoracic adenopathy. Lungs/Pleura: Emphysema. Bronchial wall thickening in the lower lobes with scattered mucous plugs. Clustered micro nodules in the left lower lobe. 5 mm subpleural nodule in the left lower lobe (5/92) stable since 2008 and benign. No follow-up recommended. Musculoskeletal: No acute fracture. CT ABDOMEN PELVIS FINDINGS Hepatobiliary: Cholecystostomy tube in the gallbladder. Multiple gallstones are present within the gallbladder. Gallbladder wall thickening and adjacent stranding, slightly improved from 10/19/2022. Tiny hepatic cysts. Liver is otherwise unremarkable. Pancreas: Fatty atrophy. Unchanged 2.9 cm cystic lesion at the tail of the pancreas. Spleen: Unremarkable. Adrenals/Urinary Tract: Stable adrenal glands. Bilateral nephrolithiasis. Unchanged 4 mm stone in the mid right ureter. No hydronephrosis. Unremarkable bladder. Stomach/Bowel: Normal caliber large and small bowel. No bowel wall thickening. Normal appendix. Stomach is within normal limits. Vascular/Lymphatic: Aortic atherosclerosis. No enlarged abdominal or pelvic lymph nodes. Reproductive: Hysterectomy.  No adnexal mass. Other: Multiple soft tissue nodules in the presacral space measuring up to 2.0 cm (series 3/image 108). Musculoskeletal: No acute fracture. IMPRESSION: CHEST 1. Lower lobe predominant airway infection/inflammation. 2. Emphysema. ABDOMEN/PELVIS 1. Cholecystostomy tube in place within the gallbladder. 2. Cholelithiasis  with slightly improved wall thickening and pericholecystic fluid compared with 10/19/2022. 3. Bilateral nephrolithiasis. Unchanged 4 mm  stone in the mid right ureter. No hydronephrosis. 4. Multiple soft tissue nodules in the presacral space measuring up to 2.0 cm. These are indeterminate and malignancy is not excluded. Short interval follow-up in 3 months is recommended to ensure stability. 5. Unchanged 2.9 cm cystic lesion at the tail of the pancreas. Continued attention on follow-up. Aortic Atherosclerosis (ICD10-I70.0) and Emphysema (ICD10-J43.9). Electronically Signed   By: Minerva Fester M.D.   On: 11/02/2022 02:31   US Abdomen Limited RUQ (LIVER/GB)  Result Date: 11/01/2022 CLINICAL DATA:  Encounter for biliary drainage tubes EXAM: ULTRASOUND ABDOMEN LIMITED RIGHT UPPER QUADRANT COMPARISON:  CT abdomen and pelvis 10/19/2022 FINDINGS: Gallbladder: Gallbladder filled with stones. Mild wall thickening measuring 5 mm in thickness. Common bile duct: Diameter: 5 mm.  No intrahepatic biliary dilation. Liver: No focal lesion identified. Increased parenchymal echogenicity. Portal vein is patent on color Doppler imaging with normal direction of blood flow towards the liver. Other: None. IMPRESSION: 1. Cholelithiasis with mild wall thickening of the gallbladder suggesting cholecystitis. 2. Hepatic steatosis. Electronically Signed   By: Minerva Fester M.D.   On: 11/01/2022 23:08   DG Chest 2 View  Result Date: 11/01/2022 CLINICAL DATA:  Encounter for suspected sepsis. EXAM: CHEST - 2 VIEW COMPARISON:  Portable chest 10/24/2022. FINDINGS: The cardiac size is normal, with again noted prominent left main pulmonary artery, stable mediastinum. There is mild aortic atherosclerosis. There are scattered atelectatic bands in both bases. On the left there is also streaky opacity in the lingula which could be due to atelectasis or a small pneumonia. Appearance of the frontal view is similar to the prior study. On the lateral view, the lingular opacities were not seen on PA Lat chest 03/29/2022. The remaining lungs are clear.  No pleural collection is seen.  Osteopenia and thoracic spondylosis. 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 IMPRESSION: Lingular opacity which could be atelectasis or a small pneumonia. Clinical correlation and radiographic follow-up recommended. Electronically Signed   By: Almira Bar M.D.   On: 11/01/2022 22:26    Labs:  CBC: Recent Labs    11/02/22 0145 11/03/22 0727 11/04/22 0003 11/05/22 0011  WBC 11.1* 9.1 7.8 7.4  HGB 11.0* 12.0 11.1* 11.0*  HCT 35.8* 39.9 36.4 36.5  PLT 381 384 328 304    COAGS: Recent Labs    10/19/22 1123 10/20/22 0225 10/20/22 2145 10/21/22 0207 10/21/22 0640 10/22/22 0207 11/01/22 2222  INR 2.0*  --   --   --   --   --  1.5*  APTT  --    < > 89* 157* 70* 69*  --    < > = values in this interval not displayed.    BMP: Recent Labs    11/02/22 0145 11/03/22 0727 11/04/22 0003 11/05/22 0011  NA 135 140 139 140  K 4.3 4.4 3.9 4.2  CL 105 103 104 105  CO2 20* 24 25 22   GLUCOSE 118* 117* 101* 87  BUN 10 10 17 14   CALCIUM 8.8* 9.0 8.7* 9.2  CREATININE 0.95 0.96 1.04* 1.18*  GFRNONAA 60* 59* 54* 46*    LIVER FUNCTION TESTS: Recent Labs    11/02/22 0145 11/03/22 0727 11/04/22 0003 11/05/22 0011  BILITOT 1.3* 0.6 0.7 0.5  AST 143* 184* 93* 40  ALT 39 121* 96* 64*  ALKPHOS 168* 182* 153* 138*  PROT 5.7* 6.2* 6.0* 5.8*  ALBUMIN  2.9* 3.0* 2.9* 2.8*    Assessment and Plan: 82 y.o. female inpatient. History of acute calculus cholecystitis   Drain Location: RUQ Size: Fr size: 12 Fr Date of placement: Placed on 5.30.24 up-sized on 6.11.24  Currently to: Drain collection device: gravity 24 hour output:  Output by Drain (mL) 11/03/22 0701 - 11/03/22 1900 11/03/22 1901 - 11/04/22 0700 11/04/22 0701 - 11/04/22 1900 11/04/22 1901 - 11/05/22 0700 11/05/22 0701 - 11/05/22 1629  Biliary Tube Cook slip-coat 12 Fr. RUQ 40 20 25 30     Bloody transitioning to serosanguinous output noted to be in the gravity bag.   Interval imaging/drain manipulation:  None  since drain exchange  Current examination: Flushes/aspirates easily.  Insertion site unremarkable. Suture and stat lock in place. Dressed appropriately.   Plan: Continue TID flushes with 5 cc NS. Record output Q shift. Dressing changes QD or PRN if soiled.  Call IR APP or on call IR MD if difficulty flushing or sudden change in drain output.  Repeat imaging/possible drain injection once output < 10 mL/QD (excluding flush material). Consideration for drain removal if output is < 10 mL/QD (excluding flush material), pending discussion with the providing surgical service.   Discharge planning: Please contact IR APP or on call IR MD prior to patient d/c to ensure appropriate follow up plans are in place. Typically patient will follow up 6-8 week for drain exchange. Cholecystomy tube drain will need to remain in place at least 6 -8  weeks unless gallbaldder surgically removed in interim; Once daily with 5 ml sterile normal saline as outpatient; will schedule f/u cholangiogram/ drain/ exchange in 6-8 weeks. IR scheduler will contact patient with date/time of appointment. Patient will need to flush drain QD with 5 cc NS, record output QD, dressing changes every 2-3 days or earlier if soiled.   IR will continue to follow - please call with questions or concerns.   Electronically Signed: Alene Mires, NP 11/05/2022, 3:48 PM   I spent a total of 15 Minutes at the patient's bedside AND on the patient's hospital floor or unit, greater than 50% of which was counseling/coordinating care for cholecystostomy tube

## 2022-11-06 DIAGNOSIS — J9601 Acute respiratory failure with hypoxia: Secondary | ICD-10-CM | POA: Diagnosis not present

## 2022-11-06 DIAGNOSIS — J189 Pneumonia, unspecified organism: Secondary | ICD-10-CM | POA: Diagnosis not present

## 2022-11-06 LAB — COMPREHENSIVE METABOLIC PANEL
ALT: 47 U/L — ABNORMAL HIGH (ref 0–44)
AST: 22 U/L (ref 15–41)
Albumin: 2.9 g/dL — ABNORMAL LOW (ref 3.5–5.0)
Alkaline Phosphatase: 116 U/L (ref 38–126)
Anion gap: 14 (ref 5–15)
BUN: 10 mg/dL (ref 8–23)
CO2: 27 mmol/L (ref 22–32)
Calcium: 9.2 mg/dL (ref 8.9–10.3)
Chloride: 101 mmol/L (ref 98–111)
Creatinine, Ser: 0.83 mg/dL (ref 0.44–1.00)
GFR, Estimated: >60 mL/min (ref >60)
Glucose, Bld: 108 mg/dL — ABNORMAL HIGH (ref 70–99)
Potassium: 3.7 mmol/L (ref 3.5–5.1)
Sodium: 142 mmol/L (ref 135–145)
Total Bilirubin: 0.5 mg/dL (ref 0.3–1.2)
Total Protein: 5.9 g/dL — ABNORMAL LOW (ref 6.5–8.1)

## 2022-11-06 LAB — CBC
HCT: 37.2 % (ref 36.0–46.0)
Hemoglobin: 11.8 g/dL — ABNORMAL LOW (ref 12.0–15.0)
MCH: 28.6 pg (ref 26.0–34.0)
MCHC: 31.7 g/dL (ref 30.0–36.0)
MCV: 90.1 fL (ref 80.0–100.0)
Platelets: 311 10*3/uL (ref 150–400)
RBC: 4.13 MIL/uL (ref 3.87–5.11)
RDW: 14.5 % (ref 11.5–15.5)
WBC: 6.6 10*3/uL (ref 4.0–10.5)
nRBC: 0 % (ref 0.0–0.2)

## 2022-11-06 LAB — CULTURE, BLOOD (ROUTINE X 2)
Culture: NO GROWTH
Culture: NO GROWTH
Special Requests: ADEQUATE

## 2022-11-06 NOTE — Plan of Care (Signed)

## 2022-11-06 NOTE — Progress Notes (Signed)
  Progress Note   Patient: Laura Mendez ZOX:[id number] DOB: May 21, 1941 DOA: 11/01/2022     4 DOS: the patient was seen and examined on 11/06/2022   Brief hospital course: 82 y.o. female with medical history significant of asthma / COPD, PAF on eliquis, prior stroke / TIA. Pt presented with bloody output into recently placed cholecystostomy bag following recent admission for acute cholecystitis  Assessment and Plan: Cholecystostomy tube dysfunction -General Surgery had been following -Eliquis initially had been on hold, later resumed 6/14 per IR recs -Hgb has remained stable -Pt is now s/p upsizing of perc chole drain 6/11 -fluid in bag seems thinner and more clear but remains bloody. Appreciate IR input. Cont to monitor for now inpt until fluid is less bloody   Acute bronchitis Seen on CT scan O2 requirement initially, but weaned to room air -Continued on home scheduled and PRN nebs -continued on empiric rocephin   Paroxysmal atrial fibrillation (HCC) Cont amiodarone Rate controlled Resumed eliquis   Asthma with COPD (HCC) Cont home scheduled and PRN nebs. On minimal O2 support No audible wheezing   Subjective: Without complaints. Still with bloody fluid in bag  Physical Exam: Vitals:   11/05/22 1546 11/05/22 2149 11/06/22 0534 11/06/22 0858  BP: 130/68 (!) 135/59 124/73 128/69  Pulse: (!) 56 (!) 58 (!) 55 (!) 57  Resp:  19  18  Temp: 97.7 F (36.5 C) 97.6 F (36.4 C) 97.6 F (36.4 C) 97.7 F (36.5 C)  TempSrc:  Oral Oral Oral  SpO2: 95% 95% 94% 96%  Weight:      Height:       General exam: Conversant, in no acute distress Respiratory system: normal chest rise, clear, no audible wheezing Cardiovascular system: regular rhythm, s1-s2 Gastrointestinal system: Nondistended, nontender, pos BS Central nervous system: No seizures, no tremors Extremities: No cyanosis, no joint deformities Skin: No rashes, no pallor Psychiatry: Affect normal // no auditory  hallucinations   Data Reviewed:  Labs reviewed: na 142, K 3.7, Cr 0.83, WBC 6.6, Hgb 11.8  Family Communication: pt in room, family at bedside  Disposition: Status is: Inpatient Remains inpatient appropriate because: Severity of illness  Planned Discharge Destination: Home    Author: Rickey Barbara, MD 11/06/2022 5:13 PM  For on call review [email or web address].

## 2022-11-06 NOTE — Progress Notes (Signed)
Referring Physician(s): Adin Hector PA  Supervising Physician: Oley Balm  Patient Status:  [facility] - In-pt  Chief Complaint:  Acute calculus cholecystitis. Cholecystostomy tub exchanged and up-sized on 6.11.24    Subjective:  Patient lying in bed at time of exam, no family at bedside. She reports concern about going home too early while she is still having some bloody output from her drain.  Allergies: Clarithromycin, Aspirin, Ibuprofen, Codeine, Diltiazem, Methotrexate, Tape, Tapentadol, and Verapamil hcl er  Medications: Prior to Admission medications   Medication Sig Start Date End Date Taking? Authorizing Provider  albuterol (VENTOLIN HFA) 108 (90 Base) MCG/ACT inhaler INHALE 2 PUFFS BY MOUTH EVERY 4 HOURS AS NEEDED Patient taking differently: Inhale 2 puffs into the lungs every 4 (four) hours as needed for wheezing or shortness of breath. 10/06/22  Yes Young, Joni Fears D, MD  amiodarone (PACERONE) 200 MG tablet Take 2 tablets (400 mg total) by mouth 2 (two) times daily for 5 days, THEN 1 tablet (200 mg total) daily for 21 days. Stop amiodarone after taking 200mg  daily for 21 days.. 10/27/22 11/22/22 Yes Burnadette Pop, MD  ammonium lactate (LAC-HYDRIN) 12 % cream Apply 1 Application topically as needed for dry skin. 04/28/22  Yes Ellsworth Lennox, PA-C  apixaban (ELIQUIS) 5 MG TABS tablet Take 1 tablet by mouth twice daily 07/26/22  Yes Lanier Prude, MD  DUPIXENT 300 MG/2ML SOPN Inject 300 mg into the skin every 14 (fourteen) days. 10/30/21  Yes [provider]  fluticasone-salmeterol (ADVAIR) 250-50 MCG/ACT AEPB INHALE 1 PUFF INTO LUNGS TWICE DAILY Patient taking differently: Inhale 1 puff into the lungs in the morning and at bedtime. 05/20/22  Yes Jetty Duhamel D, MD  letrozole [facility]) 2.5 MG tablet Take 1 tablet by mouth once daily 10/08/22  Yes Serena Croissant, MD  Multiple Vitamin (MULTIVITAMIN) tablet Take 1 tablet by mouth daily.   Yes [provider]   pantoprazole (PROTONIX) 40 MG tablet Take 1 tablet (40 mg total) by mouth daily. 10/29/22  Yes [name], MD  triamcinolone cream (KENALOG) 0.1 % Apply 1 Application topically 2 (two) times daily. 04/28/22  Yes Ellsworth Lennox, PA-C     Vital Signs: BP 128/69 (BP Location: Left Arm)   Pulse (!) 57   Temp 97.7 F (36.5 C) (Oral)   Resp 18   Ht 5\' 11"  (1.803 m)   Wt 198 lb 6.6 oz (90 kg)   SpO2 96%   BMI 27.67 kg/m   Physical Exam Vitals and nursing note reviewed.  Constitutional:      Appearance: She is well-developed.  HENT:     Head: Normocephalic and atraumatic.  Eyes:     Conjunctiva/sclera: Conjunctivae normal.  Pulmonary:     Effort: Pulmonary effort is normal.  Abdominal:     Comments: Positive RUQ  drain to gravity bag. Site is unremarkable with no erythema, edema, tenderness, bleeding or drainage noted at exit site. Suture and stat lock in place. Dressing is clean dry and intact. ~50 ml of blood-tinged fluid without clots noted in gravity bag. Drain is able to be flushed easily. No leakage or pain with flushing.  Insertion site clean and dry.     Musculoskeletal:        General: Normal range of motion.     Cervical back: Normal range of motion.  Skin:    General: Skin is warm.  Neurological:     Mental Status: She is alert and oriented to person, place, and time.  Imaging: IR EXCHANGE BILIARY DRAIN  Result Date: 11/02/2022 INDICATION: 82 year old woman underwent cholecystostomy drain placement on 10/21/2022 for acute calculus cholecystitis. She developed bloody output within the bag and from around the catheter entry site last night. She presents today for evaluation. EXAM: Cholecystostomy drain exchange and upsize MEDICATIONS: None ANESTHESIA/SEDATION: None FLUOROSCOPY: Radiation Exposure Index (as provided by the fluoroscopic device): 8 mGy Kerma COMPLICATIONS: None immediate. PROCEDURE: Informed written consent was obtained from the patient after a  thorough discussion of the procedural risks, benefits and alternatives. All questions were addressed. Maximal Sterile Barrier Technique was utilized including caps, mask, sterile gowns, sterile gloves, sterile drape, hand hygiene and skin antiseptic. A timeout was performed prior to the initiation of the procedure. Scout image showed right upper quadrant cholecystostomy drain in appropriate position. Intraluminal positioning within the gallbladder was confirmed by administering contrast under fluoroscopy. Given slow oozing seen along the catheter entry site, decision made to upsize the drain. Following local administration, the existing drain was cut and removed over 0.035 inch guidewire and replaced with a 12 [place] drain (previously 10 [place]). Contrast administered through the new drain confirmed appropriate positioning within the gallbladder lumen. The drain was secured to skin with suture and connected to bag. IMPRESSION: Successful upsize of cholecystostomy drain from 10 [place] to 12 [place] in order to tamponade slow hemorrhage along catheter tract. Electronically Signed   By: Acquanetta Belling M.D.   On: 11/02/2022 16:16    Labs:  CBC: Recent Labs    11/03/22 0727 11/04/22 0003 11/05/22 0011 11/06/22 0139  WBC 9.1 7.8 7.4 6.6  HGB 12.0 11.1* 11.0* 11.8*  HCT 39.9 36.4 36.5 37.2  PLT 384 328 304 311     COAGS: Recent Labs    10/19/22 1123 10/20/22 0225 10/20/22 2145 10/21/22 0207 10/21/22 0640 10/22/22 0207 11/01/22 2222  INR 2.0*  --   --   --   --   --  1.5*  APTT  --    < > 89* 157* 70* 69*  --    < > = values in this interval not displayed.     BMP: Recent Labs    11/03/22 0727 11/04/22 0003 11/05/22 0011 11/06/22 0139  NA 140 139 140 142  K 4.4 3.9 4.2 3.7  CL 103 104 105 101  CO2 24 25 22 27   GLUCOSE 117* 101* 87 108*  BUN 10 17 14 10   CALCIUM 9.0 8.7* 9.2 9.2  CREATININE 0.96 1.04* 1.18* 0.83  GFRNONAA 59* 54* 46* >60     LIVER FUNCTION TESTS: Recent Labs     11/03/22 0727 11/04/22 0003 11/05/22 0011 11/06/22 0139  BILITOT 0.6 0.7 0.5 0.5  AST 184* 93* 40 22  ALT 121* 96* 64* 47*  ALKPHOS 182* 153* 138* 116  PROT 6.2* 6.0* 5.8* 5.9*  ALBUMIN 3.0* 2.9* 2.8* 2.9*     Assessment and Plan: 82 y.o. female inpatient. History of acute calculus cholecystitis   Drain Location: RUQ Size: Fr size: 12 Fr Date of placement: Placed on 5.30.24 up-sized on 6.11.24  Currently to: Drain collection device: gravity 24 hour output:  Output by Drain (mL) 11/04/22 0701 - 11/04/22 1900 11/04/22 1901 - 11/05/22 0700 11/05/22 0701 - 11/05/22 1900 11/05/22 1901 - 11/06/22 0700 11/06/22 0701 - 11/06/22 1222  Biliary Tube Cook slip-coat 12 Fr. RUQ 25 30 35     Blood-tinged output noted to be in the gravity bag.   Interval imaging/drain manipulation:  None since drain exchange  Current  examination: Flushes/aspirates easily.  Insertion site unremarkable. Suture and stat lock in place. Dressed appropriately.   Plan: Continue TID flushes with 5 cc NS. Record output Q shift. Dressing changes QD or PRN if soiled.  Call IR APP or on call IR MD if difficulty flushing or sudden change in drain output.  Repeat imaging/possible drain injection once output < 10 mL/QD (excluding flush material). Consideration for drain removal if output is < 10 mL/QD (excluding flush material), pending discussion with the providing surgical service.   Discharge planning: Please contact IR APP or on call IR MD prior to patient d/c to ensure appropriate follow up plans are in place. Typically patient will follow up 6-8 week for drain exchange. Cholecystomy tube drain will need to remain in place at least 6 -8  weeks unless gallbaldder surgically removed in interim; Once daily with 5 ml sterile normal saline as outpatient; will schedule f/u cholangiogram/ drain/ exchange in 6-8 weeks. IR scheduler will contact patient with date/time of appointment. Patient will need to flush drain QD  with 5 cc NS, record output QD, dressing changes every 2-3 days or earlier if soiled.   IR will continue to follow - please call with questions or concerns.   Electronically Signed: Kennieth Francois, PA-C 11/06/2022, 12:22 PM   I spent a total of 15 Minutes at the patient's bedside AND on the patient's hospital floor or unit, greater than 50% of which was counseling/coordinating care for cholecystostomy tube

## 2022-11-07 DIAGNOSIS — J189 Pneumonia, unspecified organism: Secondary | ICD-10-CM | POA: Diagnosis not present

## 2022-11-07 DIAGNOSIS — J9601 Acute respiratory failure with hypoxia: Secondary | ICD-10-CM | POA: Diagnosis not present

## 2022-11-07 LAB — CBC
HCT: 38.2 % (ref 36.0–46.0)
Hemoglobin: 11.9 g/dL — ABNORMAL LOW (ref 12.0–15.0)
MCH: 27.9 pg (ref 26.0–34.0)
MCHC: 31.2 g/dL (ref 30.0–36.0)
MCV: 89.7 fL (ref 80.0–100.0)
Platelets: 290 10*3/uL (ref 150–400)
RBC: 4.26 MIL/uL (ref 3.87–5.11)
RDW: 14.6 % (ref 11.5–15.5)
WBC: 7 10*3/uL (ref 4.0–10.5)
nRBC: 0 % (ref 0.0–0.2)

## 2022-11-07 LAB — COMPREHENSIVE METABOLIC PANEL
ALT: 34 U/L (ref 0–44)
AST: 20 U/L (ref 15–41)
Albumin: 2.9 g/dL — ABNORMAL LOW (ref 3.5–5.0)
Alkaline Phosphatase: 100 U/L (ref 38–126)
Anion gap: 8 (ref 5–15)
BUN: 10 mg/dL (ref 8–23)
CO2: 26 mmol/L (ref 22–32)
Calcium: 8.8 mg/dL — ABNORMAL LOW (ref 8.9–10.3)
Chloride: 103 mmol/L (ref 98–111)
Creatinine, Ser: 0.9 mg/dL (ref 0.44–1.00)
GFR, Estimated: >60 mL/min (ref >60)
Glucose, Bld: 99 mg/dL (ref 70–99)
Potassium: 3.8 mmol/L (ref 3.5–5.1)
Sodium: 137 mmol/L (ref 135–145)
Total Bilirubin: 0.8 mg/dL (ref 0.3–1.2)
Total Protein: 5.9 g/dL — ABNORMAL LOW (ref 6.5–8.1)

## 2022-11-07 LAB — CULTURE, BLOOD (ROUTINE X 2): Special Requests: ADEQUATE

## 2022-11-07 MED ORDER — SODIUM CHLORIDE FLUSH 0.9 % IV SOLN: INTRAVENOUS | 0 refills | Status: DC

## 2022-11-07 NOTE — Evaluation (Signed)
Occupational Therapy Evaluation Patient Details Name: Laura Mendez MRN: [id number] DOB: 22-Apr-1941 Today's Date: 11/07/2022   History of Present Illness Pt is a 82 y.o. F who presents 11/01/2022 with bloody output into recently placed cholecystostomy bag following recent admission for acute cholecystitis. Pt now s/p upsizing perc chole drain 6/11. Significant PMH: asthma, COPD, PAF, prior stroke/TIA.   Clinical Impression   Pt seen for above diagnosis. Pt doing well, in good spirits, states she feels like she is at baseline. Pt lives alone in 2 story house, uses chair lift to get upstairs, RW on both floors. Pt will be going home to son's house during recovery from surgery, one level, 2 stairs to enter, states someone will be there almost all the time to help if needed. Pt PLOF indpendent. Pt currently supervision/set-up for ADLs, displays good activity tolerance, strength, safety awareness. Pt was able to ambulate to 5th floor gym using elevator, perform stairs in gym with supervision using R hand rail, no LOB, only two rest breaks for ambulation ~100 feet. Pt's son feels confident they can assist at home if needed, Pt has no further acute care needs, no OT follow up necessary.      Recommendations for follow up therapy are one component of a multi-disciplinary discharge planning process, led by the attending physician.  Recommendations may be updated based on patient status, additional functional criteria and insurance authorization.   Assistance Recommended at Discharge Intermittent Supervision/Assistance  Patient can return home with the following A little help with walking and/or transfers;A little help with bathing/dressing/bathroom;Assistance with cooking/housework;Assist for transportation;Help with stairs or ramp for entrance    Functional Status Assessment  Patient has had a recent decline in their functional status and demonstrates the ability to make significant improvements in  function in a reasonable and predictable amount of time.  Equipment Recommendations  None recommended by OT    Recommendations for Other Services       Precautions / Restrictions Precautions Precautions: Fall;Other (comment) Precaution Comments: chole drain Restrictions Weight Bearing Restrictions: No      Mobility Bed Mobility               General bed mobility comments: arrived and left in chair, did not want to get in bed    Transfers Overall transfer level: Needs assistance Equipment used: Rolling walker (2 wheels), None Transfers: Sit to/from Stand Sit to Stand: Supervision     Step pivot transfers: Supervision     General transfer comment: supervision for safety      Balance Overall balance assessment: Mild deficits observed, not formally tested                                         ADL either performed or assessed with clinical judgement   ADL Overall ADL's : Needs assistance/impaired Eating/Feeding: Independent   Grooming: Supervision/safety;Standing   Upper Body Bathing: Supervision/ safety;Sitting   Lower Body Bathing: Supervison/ safety;Sitting/lateral leans   Upper Body Dressing : Set up;Sitting   Lower Body Dressing: Sit to/from stand;Supervision/safety;Set up   Toilet Transfer: Supervision/safety;Ambulation;Rolling walker (2 wheels)   Toileting- Clothing Manipulation and Hygiene: Modified independent;Sitting/lateral lean       Functional mobility during ADLs: Supervision/safety;Rolling walker (2 wheels) General ADL Comments: good overall strength/endurance for ADLs     Vision Baseline Vision/History: 1 Wears glasses Ability to See in Adequate Light: 0 Adequate Patient Visual  Report: No change from baseline       Perception     Praxis      Pertinent Vitals/Pain       Hand Dominance Right   Extremity/Trunk Assessment Upper Extremity Assessment Upper Extremity Assessment: Overall WFL for tasks  assessed   Lower Extremity Assessment Lower Extremity Assessment: Defer to PT evaluation   Cervical / Trunk Assessment Cervical / Trunk Assessment: Kyphotic   Communication Communication Communication: No difficulties   Cognition Arousal/Alertness: Awake/alert Behavior During Therapy: WFL for tasks assessed/performed Overall Cognitive Status: Within Functional Limits for tasks assessed                                       General Comments       Exercises     Shoulder Instructions      Home Living Family/patient expects to be discharged to:: Private residence Living Arrangements: Children Available Help at Discharge: Family Type of Home: House Home Access: Stairs to enter [employer] of Steps: 2 (through garage)   Home Layout: Able to live on main level with bedroom/bathroom     Bathroom Shower/Tub: Walk-in shower         Home Equipment: Agricultural consultant (2 wheels);Shower seat - built in   Additional Comments: Pt to d/c to son's home, which is listed above.      Prior Functioning/Environment Prior Level of Function : Independent/Modified Independent;Driving;Patient poor historian/Family not available             Mobility Comments: uses RW          OT Problem List: Decreased strength;Decreased activity tolerance;Pain      OT Treatment/Interventions: Self-care/ADL training;Therapeutic exercise;Energy conservation;Therapeutic activities;Patient/family education    OT Goals(Current goals can be found in the care plan section) Acute Rehab OT Goals Patient Stated Goal: to return home to son's OT Goal Formulation: With patient/family Time For Goal Achievement: 11/21/22 Potential to Achieve Goals: Good  OT Frequency: Min 2X/week    Co-evaluation              AM-PAC OT "6 Clicks" Daily Activity     Outcome Measure Help from another person eating meals?: None Help from another person taking care of personal grooming?: A  Little Help from another person toileting, which includes using toliet, bedpan, or urinal?: A Little Help from another person bathing (including washing, rinsing, drying)?: A Little Help from another person to put on and taking off regular upper body clothing?: A Little Help from another person to put on and taking off regular lower body clothing?: A Little 6 Click Score: 19   End of Session Equipment Utilized During Treatment: Gait belt;Rolling walker (2 wheels) Nurse Communication: Mobility status  Activity Tolerance: Patient tolerated treatment well Patient left: in chair;with call bell/phone within reach;with family/visitor present  OT Visit Diagnosis: Muscle weakness (generalized) (M62.81);Pain Pain - part of body:  (abdomen)                Time: 4098-1191 OT Time Calculation (min): 27 min Charges:  OT General Charges $OT Visit: 1 Visit OT Evaluation $OT Eval Low Complexity: 1 Low OT Treatments $Self Care/Home Management : 8-22 mins  [address] OTR/L   Alexis Goodell 11/07/2022, 3:24 PM

## 2022-11-07 NOTE — Progress Notes (Signed)
  Progress Note   Patient: Laura Mendez ZOX:[id number] DOB: 1940/12/31 DOA: 11/01/2022     5 DOS: the patient was seen and examined on 11/07/2022   Brief hospital course: 82 y.o. female with medical history significant of asthma / COPD, PAF on eliquis, prior stroke / TIA. Pt presented with bloody output into recently placed cholecystostomy bag following recent admission for acute cholecystitis  Assessment and Plan: Cholecystostomy tube dysfunction -General Surgery had been following -Eliquis initially had been on hold, later resumed 6/14 per IR recs -Hgb has remained stable -Pt is now s/p upsizing of perc chole drain 6/11 -fluid in bag is thinner and more clear with slight tinge of blood Some clots were reported this AM -Appreciate IR input. Recs to cont to monitor overnight   Acute bronchitis Seen on CT scan O2 requirement initially, but weaned to room air -Continued on home scheduled and PRN nebs -had been on empiric rocephin   Paroxysmal atrial fibrillation (HCC) Cont amiodarone Rate controlled Resumed eliquis   Asthma with COPD (HCC) Cont home scheduled and PRN nebs. On minimal O2 support No audible wheezing   Subjective: Reports some blood clots this AM  Physical Exam: Vitals:   11/06/22 1949 11/07/22 0339 11/07/22 0758 11/07/22 0811  BP: 117/88 131/75 124/68   Pulse: (!) 57 63 (!) 58   Resp:  16 17   Temp: 98 F (36.7 C) (!) 97.5 F (36.4 C) 97.6 F (36.4 C)   TempSrc: Oral Oral Oral   SpO2: 95% 95% 95% 95%  Weight:      Height:       General exam: Awake, laying in bed, in nad Respiratory system: Normal respiratory effort, no wheezing Cardiovascular system: regular rate, s1, s2 Gastrointestinal system: Soft, nondistended, positive BS Central nervous system: CN2-12 grossly intact, strength intact Extremities: Perfused, no clubbing Skin: Normal skin turgor, no notable skin lesions seen Psychiatry: Mood normal // no visual hallucinations   Data  Reviewed:  Labs reviewed: Na 137, K 3.8, Cr 0.90, WBC 7.0, Hgb 11.9  Family Communication: pt in room, family at bedside  Disposition: Status is: Inpatient Remains inpatient appropriate because: Severity of illness  Planned Discharge Destination: Home    Author: Rickey Barbara, MD 11/07/2022 4:57 PM  For on call review [email or web address].

## 2022-11-07 NOTE — Plan of Care (Signed)

## 2022-11-07 NOTE — Evaluation (Signed)
Physical Therapy Evaluation Patient Details Name: Laura Mendez MRN: [id number] DOB: 1940/07/04 Today's Date: 11/07/2022  History of Present Illness  Pt is a 82 y.o. F who presents 11/01/2022 with bloody output into recently placed cholecystostomy bag following recent admission for acute cholecystitis. Pt now s/p upsizing perc chole drain 6/11. Significant PMH: asthma, COPD, PAF, prior stroke/TIA.  Clinical Impression  Pt admitted with above. Plans to d/c home initially to son's home with [facility] RN. Pt/pt family reports pt has good caregiver support as needed. Pt appears to be close to her functional baseline. Pt ambulating 150 ft with a walker at a supervision-min guard assist level. Encouraged continued mobility as tolerated and education provided on appropriate DME (I.e. use of shower seat and continued use of RW). No further acute or follow up PT needs recommended. Thank you for this consult.      Recommendations for follow up therapy are one component of a multi-disciplinary discharge planning process, led by the attending physician.  Recommendations may be updated based on patient status, additional functional criteria and insurance authorization.  Follow Up Recommendations       Assistance Recommended at Discharge Intermittent Supervision/Assistance  Patient can return home with the following  Assistance with cooking/housework;Assist for transportation;Help with stairs or ramp for entrance    Equipment Recommendations None recommended by PT  Recommendations for Other Services       Functional Status Assessment Patient has not had a recent decline in their functional status     Precautions / Restrictions Precautions Precautions: Fall;Other (comment) Precaution Comments: chole drain Restrictions Weight Bearing Restrictions: No      Mobility  Bed Mobility               General bed mobility comments: OOB in chair    Transfers Overall transfer level: Needs  assistance Equipment used: Rolling walker (2 wheels), None Transfers: Sit to/from Stand Sit to Stand: Supervision           General transfer comment: supervision for safety    Ambulation/Gait Ambulation/Gait assistance: Supervision, Min guard Gait Distance (Feet): 150 Feet Assistive device: Rolling walker (2 wheels) Gait Pattern/deviations: Step-through pattern, Decreased stride length, Decreased dorsiflexion - right, Decreased dorsiflexion - left Gait velocity: decreased     General Gait Details: Slow and steady pace, supervision-min guard for safety. no overt LOB  Stairs            Wheelchair Mobility    Modified Rankin (Stroke Patients Only)       Balance Overall balance assessment: Mild deficits observed, not formally tested                                           Pertinent Vitals/Pain Pain Assessment Pain Assessment: No/denies pain    Home Living Family/patient expects to be discharged to:: Private residence Living Arrangements: Children Available Help at Discharge: Family Type of Home: House Home Access: Stairs to enter   Secretary/administrator of Steps: 2 (through garage)   Home Layout: Able to live on main level with bedroom/bathroom Home Equipment: Agricultural consultant (2 wheels);Shower seat - built in Additional Comments: Pt to d/c to son's home, which is listed above.    Prior Function Prior Level of Function : Independent/Modified Independent;Driving;Patient poor historian/Family not available             Mobility Comments: uses RW  Hand Dominance   Dominant Hand: Right    Extremity/Trunk Assessment   Upper Extremity Assessment Upper Extremity Assessment: Defer to OT evaluation    Lower Extremity Assessment Lower Extremity Assessment: Generalized weakness    Cervical / Trunk Assessment Cervical / Trunk Assessment: Kyphotic  Communication   Communication: No difficulties  Cognition Arousal/Alertness:  Awake/alert Behavior During Therapy: WFL for tasks assessed/performed Overall Cognitive Status: Within Functional Limits for tasks assessed                                 General Comments: WFL for basic mobility        General Comments      Exercises     Assessment/Plan    PT Assessment Patient does not need any further PT services  PT Problem List Decreased strength;Decreased activity tolerance;Decreased balance;Decreased mobility;Decreased cognition;Decreased safety awareness;Cardiopulmonary status limiting activity       PT Treatment Interventions      PT Goals (Current goals can be found in the Care Plan section)  Acute Rehab PT Goals Patient Stated Goal: back to independence PT Goal Formulation: All assessment and education complete, DC therapy    Frequency       Co-evaluation               AM-PAC PT "6 Clicks" Mobility  Outcome Measure Help needed turning from your back to your side while in a flat bed without using bedrails?: None Help needed moving from lying on your back to sitting on the side of a flat bed without using bedrails?: None Help needed moving to and from a bed to a chair (including a wheelchair)?: A Little Help needed standing up from a chair using your arms (e.g., wheelchair or bedside chair)?: A Little Help needed to walk in hospital room?: A Little Help needed climbing 3-5 steps with a railing? : A Little 6 Click Score: 20    End of Session Equipment Utilized During Treatment: Gait belt Activity Tolerance: Patient tolerated treatment well Patient left: in chair;with call bell/phone within reach;Other (comment) (with OT) Nurse Communication: Mobility status PT Visit Diagnosis: Other abnormalities of gait and mobility (R26.89);Muscle weakness (generalized) (M62.81);Difficulty in walking, not elsewhere classified (R26.2)    Time: 9147-8295 PT Time Calculation (min) (ACUTE ONLY): 23 min   Charges:   PT Evaluation $PT  Eval Low Complexity: 1 Low PT Treatments $Therapeutic Activity: 8-22 mins        Lillia Pauls, PT, DPT Acute Rehabilitation Services Office [phone]   Norval Morton 11/07/2022, 3:08 PM

## 2022-11-07 NOTE — Progress Notes (Signed)
Referring Physician(s): Adin Hector PA  Supervising Physician: Oley Balm  Patient Status:  [facility] - In-pt  Chief Complaint:  Acute calculus cholecystitis. Cholecystostomy tub exchanged and up-sized on 6.11.24    Subjective:  Patient lying in bed at time of exam, family at bedside. Patient states she is "feeling ok." Family has concerns that she may be too physically weak to transition safely to home.  Allergies: Clarithromycin, Aspirin, Ibuprofen, Codeine, Diltiazem, Methotrexate, Tape, Tapentadol, and Verapamil hcl er  Medications: Prior to Admission medications   Medication Sig Start Date End Date Taking? Authorizing Provider  albuterol (VENTOLIN HFA) 108 (90 Base) MCG/ACT inhaler INHALE 2 PUFFS BY MOUTH EVERY 4 HOURS AS NEEDED Patient taking differently: Inhale 2 puffs into the lungs every 4 (four) hours as needed for wheezing or shortness of breath. 10/06/22  Yes Young, Joni Fears D, MD  amiodarone (PACERONE) 200 MG tablet Take 2 tablets (400 mg total) by mouth 2 (two) times daily for 5 days, THEN 1 tablet (200 mg total) daily for 21 days. Stop amiodarone after taking 200mg  daily for 21 days.. 10/27/22 11/22/22 Yes Burnadette Pop, MD  ammonium lactate (LAC-HYDRIN) 12 % cream Apply 1 Application topically as needed for dry skin. 04/28/22  Yes Ellsworth Lennox, PA-C  apixaban (ELIQUIS) 5 MG TABS tablet Take 1 tablet by mouth twice daily 07/26/22  Yes Lanier Prude, MD  DUPIXENT 300 MG/2ML SOPN Inject 300 mg into the skin every 14 (fourteen) days. 10/30/21  Yes [provider]  fluticasone-salmeterol (ADVAIR) 250-50 MCG/ACT AEPB INHALE 1 PUFF INTO LUNGS TWICE DAILY Patient taking differently: Inhale 1 puff into the lungs in the morning and at bedtime. 05/20/22  Yes Jetty Duhamel D, MD  letrozole [facility]) 2.5 MG tablet Take 1 tablet by mouth once daily 10/08/22  Yes Serena Croissant, MD  Multiple Vitamin (MULTIVITAMIN) tablet Take 1 tablet by mouth daily.   Yes [provider]  pantoprazole (PROTONIX) 40 MG tablet Take 1 tablet (40 mg total) by mouth daily. 10/29/22  Yes [name], MD  triamcinolone cream (KENALOG) 0.1 % Apply 1 Application topically 2 (two) times daily. 04/28/22  Yes Ellsworth Lennox, PA-C     Vital Signs: BP 124/68 (BP Location: Left Arm)   Pulse (!) 58   Temp 97.6 F (36.4 C) (Oral)   Resp 17   Ht 5\' 11"  (1.803 m)   Wt 198 lb 6.6 oz (90 kg)   SpO2 95%   BMI 27.67 kg/m   Physical Exam Vitals and nursing note reviewed.  Constitutional:      Appearance: She is well-developed.  HENT:     Head: Normocephalic and atraumatic.  Eyes:     Conjunctiva/sclera: Conjunctivae normal.  Pulmonary:     Effort: Pulmonary effort is normal.  Abdominal:     Comments: Positive RUQ  drain to gravity bag. Site is unremarkable with no erythema, edema, tenderness, bleeding or drainage noted at exit site. Suture and stat lock in place. Dressing is clean dry and intact. ~25 ml of bilious output with faint tinge of blood  without clots noted in gravity bag. Drain is able to be flushed easily. No leakage or pain with flushing.  Insertion site clean and dry.     Musculoskeletal:        General: Normal range of motion.     Cervical back: Normal range of motion.  Skin:    General: Skin is warm.  Neurological:     Mental Status: She is alert  and oriented to person, place, and time.     Imaging: No results found.  Labs:  CBC: Recent Labs    11/04/22 0003 11/05/22 0011 11/06/22 0139 11/07/22 0333  WBC 7.8 7.4 6.6 7.0  HGB 11.1* 11.0* 11.8* 11.9*  HCT 36.4 36.5 37.2 38.2  PLT 328 304 311 290     COAGS: Recent Labs    10/19/22 1123 10/20/22 0225 10/20/22 2145 10/21/22 0207 10/21/22 0640 10/22/22 0207 11/01/22 2222  INR 2.0*  --   --   --   --   --  1.5*  APTT  --    < > 89* 157* 70* 69*  --    < > = values in this interval not displayed.     BMP: Recent Labs    11/04/22 0003 11/05/22 0011 11/06/22 0139 11/07/22 0333   NA 139 140 142 137  K 3.9 4.2 3.7 3.8  CL 104 105 101 103  CO2 25 22 27 26   GLUCOSE 101* 87 108* 99  BUN 17 14 10 10   CALCIUM 8.7* 9.2 9.2 8.8*  CREATININE 1.04* 1.18* 0.83 0.90  GFRNONAA 54* 46* >60 >60     LIVER FUNCTION TESTS: Recent Labs    11/04/22 0003 11/05/22 0011 11/06/22 0139 11/07/22 0333  BILITOT 0.7 0.5 0.5 0.8  AST 93* 40 22 20  ALT 96* 64* 47* 34  ALKPHOS 153* 138* 116 100  PROT 6.0* 5.8* 5.9* 5.9*  ALBUMIN 2.9* 2.8* 2.9* 2.9*     Assessment and Plan: 82 y.o. female inpatient. History of acute calculus cholecystitis   Drain Location: RUQ Size: Fr size: 12 Fr Date of placement: Placed on 5.30.24 up-sized on 6.11.24  Currently to: Drain collection device: gravity 24 hour output:  Output by Drain (mL) 11/05/22 0701 - 11/05/22 1900 11/05/22 1901 - 11/06/22 0700 11/06/22 0701 - 11/06/22 1900 11/06/22 1901 - 11/07/22 0700 11/07/22 0701 - 11/07/22 1400  Biliary Tube Cook slip-coat 12 Fr. RUQ 35  35 20 20   Bilious output with faint tinge of blood noted to be in the gravity bag.   Interval imaging/drain manipulation:  None since drain exchange  Current examination: Flushes/aspirates easily.  Insertion site unremarkable. Suture and stat lock in place. Dressed appropriately.   Plan: Continue TID flushes with 5 cc NS. Record output Q shift. Dressing changes QD or PRN if soiled.  Call IR APP or on call IR MD if difficulty flushing or sudden change in drain output.  Repeat imaging/possible drain injection once output < 10 mL/QD (excluding flush material). Consideration for drain removal if output is < 10 mL/QD (excluding flush material), pending discussion with the providing surgical service.   Discharge planning: Please contact IR APP or on call IR MD prior to patient d/c to ensure appropriate follow up plans are in place. Typically patient will follow up 6-8 week for drain exchange. Cholecystomy tube drain will need to remain in place at least 6 -8   weeks unless gallbaldder surgically removed in interim; Once daily with 5 ml sterile normal saline as outpatient; will schedule f/u cholangiogram/ drain/ exchange in 6-8 weeks. IR scheduler will contact patient with date/time of appointment. Patient will need to flush drain QD with 5 cc NS, record output QD, dressing changes every 2-3 days or earlier if soiled.   IR will continue to follow - please call with questions or concerns.   Electronically Signed: Kennieth Francois, PA-C 11/07/2022, 2:00 PM   I spent a total of 15  Minutes at the patient's bedside AND on the patient's hospital floor or unit, greater than 50% of which was counseling/coordinating care for cholecystostomy tube

## 2022-11-08 ENCOUNTER — Other Ambulatory Visit (HOSPITAL_COMMUNITY): Payer: Self-pay

## 2022-11-08 DIAGNOSIS — J9601 Acute respiratory failure with hypoxia: Secondary | ICD-10-CM | POA: Diagnosis not present

## 2022-11-08 DIAGNOSIS — J189 Pneumonia, unspecified organism: Secondary | ICD-10-CM | POA: Diagnosis not present

## 2022-11-08 LAB — CBC
HCT: 38.9 % (ref 36.0–46.0)
Hemoglobin: 11.9 g/dL — ABNORMAL LOW (ref 12.0–15.0)
MCH: 27.9 pg (ref 26.0–34.0)
MCHC: 30.6 g/dL (ref 30.0–36.0)
MCV: 91.3 fL (ref 80.0–100.0)
Platelets: 281 10*3/uL (ref 150–400)
RBC: 4.26 MIL/uL (ref 3.87–5.11)
RDW: 14.6 % (ref 11.5–15.5)
WBC: 7.5 10*3/uL (ref 4.0–10.5)
nRBC: 0 % (ref 0.0–0.2)

## 2022-11-08 MED ORDER — NORMAL SALINE FLUSH 0.9 % IV SOLN
Freq: Every day | INTRAVENOUS | 0 refills | Status: DC
  Filled 2022-11-08: qty 400, 40d supply, fill #0

## 2022-11-08 NOTE — Plan of Care (Signed)
Problem: Education: Goal: Knowledge of General Education information will improve Description: Including pain rating scale, medication(s)/side effects and non-pharmacologic comfort measures 11/08/2022 1345 by Weyman Pedro, RN Outcome: Adequate for Discharge 11/08/2022 1051 by Weyman Pedro, RN Outcome: Progressing   Problem: Health Behavior/Discharge Planning: Goal: Ability to manage health-related needs will improve 11/08/2022 1345 by Weyman Pedro, RN Outcome: Adequate for Discharge 11/08/2022 1051 by Weyman Pedro, RN Outcome: Progressing   Problem: Clinical Measurements: Goal: Ability to maintain clinical measurements within normal limits will improve 11/08/2022 1345 by Weyman Pedro, RN Outcome: Adequate for Discharge 11/08/2022 1051 by Weyman Pedro, RN Outcome: Progressing Goal: Will remain free from infection 11/08/2022 1345 by Weyman Pedro, RN Outcome: Adequate for Discharge 11/08/2022 1051 by Weyman Pedro, RN Outcome: Progressing Goal: Diagnostic test results will improve 11/08/2022 1345 by Weyman Pedro, RN Outcome: Adequate for Discharge 11/08/2022 1051 by Weyman Pedro, RN Outcome: Progressing Goal: Respiratory complications will improve 11/08/2022 1345 by Weyman Pedro, RN Outcome: Adequate for Discharge 11/08/2022 1051 by Weyman Pedro, RN Outcome: Progressing Goal: Cardiovascular complication will be avoided 11/08/2022 1345 by Weyman Pedro, RN Outcome: Adequate for Discharge 11/08/2022 1051 by Weyman Pedro, RN Outcome: Progressing   Problem: Activity: Goal: Risk for activity intolerance will decrease 11/08/2022 1345 by Weyman Pedro, RN Outcome: Adequate for Discharge 11/08/2022 1051 by Weyman Pedro, RN Outcome: Progressing   Problem: Nutrition: Goal: Adequate nutrition will be maintained 11/08/2022 1345 by Weyman Pedro, RN Outcome: Adequate for Discharge 11/08/2022 1051 by  Weyman Pedro, RN Outcome: Progressing   Problem: Coping: Goal: Level of anxiety will decrease 11/08/2022 1345 by Weyman Pedro, RN Outcome: Adequate for Discharge 11/08/2022 1051 by Weyman Pedro, RN Outcome: Progressing   Problem: Elimination: Goal: Will not experience complications related to bowel motility 11/08/2022 1345 by Weyman Pedro, RN Outcome: Adequate for Discharge 11/08/2022 1051 by Weyman Pedro, RN Outcome: Progressing Goal: Will not experience complications related to urinary retention 11/08/2022 1345 by Weyman Pedro, RN Outcome: Adequate for Discharge 11/08/2022 1051 by Weyman Pedro, RN Outcome: Progressing   Problem: Pain Managment: Goal: General experience of comfort will improve 11/08/2022 1345 by Weyman Pedro, RN Outcome: Adequate for Discharge 11/08/2022 1051 by Weyman Pedro, RN Outcome: Progressing   Problem: Safety: Goal: Ability to remain free from injury will improve 11/08/2022 1345 by Weyman Pedro, RN Outcome: Adequate for Discharge 11/08/2022 1051 by Weyman Pedro, RN Outcome: Progressing   Problem: Skin Integrity: Goal: Risk for impaired skin integrity will decrease 11/08/2022 1345 by Weyman Pedro, RN Outcome: Adequate for Discharge 11/08/2022 1051 by Weyman Pedro, RN Outcome: Progressing   Problem: Education: Goal: Knowledge of General Education information will improve Description: Including pain rating scale, medication(s)/side effects and non-pharmacologic comfort measures 11/08/2022 1345 by Weyman Pedro, RN Outcome: Adequate for Discharge 11/08/2022 1051 by Weyman Pedro, RN Outcome: Progressing   Problem: Health Behavior/Discharge Planning: Goal: Ability to manage health-related needs will improve 11/08/2022 1345 by Weyman Pedro, RN Outcome: Adequate for Discharge 11/08/2022 1051 by Weyman Pedro, RN Outcome: Progressing   Problem: Clinical  Measurements: Goal: Ability to maintain clinical measurements within normal limits will improve 11/08/2022 1345 by Weyman Pedro, RN Outcome: Adequate for Discharge 11/08/2022 1051 by Weyman Pedro, RN Outcome: Progressing Goal: Will remain free from infection 11/08/2022 1345 by Weyman Pedro, RN Outcome: Adequate for Discharge 11/08/2022 1051 by  Weyman Pedro, RN Outcome: Progressing Goal: Diagnostic test results will improve 11/08/2022 1345 by Weyman Pedro, RN Outcome: Adequate for Discharge 11/08/2022 1051 by Weyman Pedro, RN Outcome: Progressing Goal: Respiratory complications will improve 11/08/2022 1345 by Weyman Pedro, RN Outcome: Adequate for Discharge 11/08/2022 1051 by Weyman Pedro, RN Outcome: Progressing Goal: Cardiovascular complication will be avoided 11/08/2022 1345 by Weyman Pedro, RN Outcome: Adequate for Discharge 11/08/2022 1051 by Weyman Pedro, RN Outcome: Progressing   Problem: Activity: Goal: Risk for activity intolerance will decrease 11/08/2022 1345 by Weyman Pedro, RN Outcome: Adequate for Discharge 11/08/2022 1051 by Weyman Pedro, RN Outcome: Progressing   Problem: Nutrition: Goal: Adequate nutrition will be maintained 11/08/2022 1345 by Weyman Pedro, RN Outcome: Adequate for Discharge 11/08/2022 1051 by Weyman Pedro, RN Outcome: Progressing   Problem: Coping: Goal: Level of anxiety will decrease 11/08/2022 1345 by Weyman Pedro, RN Outcome: Adequate for Discharge 11/08/2022 1051 by Weyman Pedro, RN Outcome: Progressing   Problem: Elimination: Goal: Will not experience complications related to bowel motility 11/08/2022 1345 by Weyman Pedro, RN Outcome: Adequate for Discharge 11/08/2022 1051 by Weyman Pedro, RN Outcome: Progressing Goal: Will not experience complications related to urinary retention 11/08/2022 1345 by Weyman Pedro, RN Outcome:  Adequate for Discharge 11/08/2022 1051 by Weyman Pedro, RN Outcome: Progressing   Problem: Pain Managment: Goal: General experience of comfort will improve 11/08/2022 1345 by Weyman Pedro, RN Outcome: Adequate for Discharge 11/08/2022 1051 by Weyman Pedro, RN Outcome: Progressing   Problem: Safety: Goal: Ability to remain free from injury will improve 11/08/2022 1345 by Weyman Pedro, RN Outcome: Adequate for Discharge 11/08/2022 1051 by Weyman Pedro, RN Outcome: Progressing   Problem: Skin Integrity: Goal: Risk for impaired skin integrity will decrease 11/08/2022 1345 by Weyman Pedro, RN Outcome: Adequate for Discharge 11/08/2022 1051 by Weyman Pedro, RN Outcome: Progressing

## 2022-11-08 NOTE — Progress Notes (Signed)
Referring Physician(s): Trixie Deis, PA-C  Supervising Physician: Ruel Favors  Patient Status:  [facility] - In-pt  Chief Complaint: Acute calculous cholecystitis s/p percutaneous cholecystostomy placement in IR 10/21/22 and exchange/upsize 11/02/22  Subjective:  Patient sitting up in chair reading, son present at bedside - he reports patient will be staying with him until she is stronger and he will be caring for the drain. He is comfortable with drain care as he was doing it prior to this hospital visit as well. Laura Mendez denies any complaints, feeling well and tolerating diet. She is very hopeful to go home soon. They have a follow up appointment with [facility] next Friday and are hopeful that they can schedule cholecystectomy.  Allergies: Clarithromycin, Aspirin, Ibuprofen, Codeine, Diltiazem, Methotrexate, Tape, Tapentadol, and Verapamil hcl er  Medications: Prior to Admission medications   Medication Sig Start Date End Date Taking? Authorizing Provider  albuterol (VENTOLIN HFA) 108 (90 Base) MCG/ACT inhaler INHALE 2 PUFFS BY MOUTH EVERY 4 HOURS AS NEEDED Patient taking differently: Inhale 2 puffs into the lungs every 4 (four) hours as needed for wheezing or shortness of breath. 10/06/22  Yes Young, Joni Fears D, MD  amiodarone (PACERONE) 200 MG tablet Take 2 tablets (400 mg total) by mouth 2 (two) times daily for 5 days, THEN 1 tablet (200 mg total) daily for 21 days. Stop amiodarone after taking 200mg  daily for 21 days.. 10/27/22 11/22/22 Yes Burnadette Pop, MD  ammonium lactate (LAC-HYDRIN) 12 % cream Apply 1 Application topically as needed for dry skin. 04/28/22  Yes Ellsworth Lennox, PA-C  apixaban (ELIQUIS) 5 MG TABS tablet Take 1 tablet by mouth twice daily 07/26/22  Yes Lanier Prude, MD  DUPIXENT 300 MG/2ML SOPN Inject 300 mg into the skin every 14 (fourteen) days. 10/30/21  Yes [provider]  fluticasone-salmeterol (ADVAIR) 250-50 MCG/ACT AEPB INHALE 1 PUFF INTO LUNGS TWICE  DAILY Patient taking differently: Inhale 1 puff into the lungs in the morning and at bedtime. 05/20/22  Yes Jetty Duhamel D, MD  letrozole [facility]) 2.5 MG tablet Take 1 tablet by mouth once daily 10/08/22  Yes Serena Croissant, MD  Multiple Vitamin (MULTIVITAMIN) tablet Take 1 tablet by mouth daily.   Yes [provider]  pantoprazole (PROTONIX) 40 MG tablet Take 1 tablet (40 mg total) by mouth daily. 10/29/22  Yes [name], MD  sodium chloride flush 0.9 % SOLN injection Flush drain once daily with 5 mL of saline. 11/07/22  Yes Kennieth Francois, PA  triamcinolone cream (KENALOG) 0.1 % Apply 1 Application topically 2 (two) times daily. 04/28/22  Yes Ellsworth Lennox, PA-C  Sodium Chloride Flush (NORMAL SALINE FLUSH) 0.9 % SOLN Flush drain once daily with 5ml of saline 11/07/22   Kennieth Francois, PA     Vital Signs: BP (!) 131/59 (BP Location: Left Arm)   Pulse 62   Temp 98.1 F (36.7 C)   Resp 16   Ht 5\' 11"  (1.803 m)   Wt 198 lb 6.6 oz (90 kg)   SpO2 95%   BMI 27.67 kg/m   Physical Exam Vitals and nursing note reviewed.  Constitutional:      General: She is not in acute distress. HENT:     Head: Normocephalic.  Cardiovascular:     Rate and Rhythm: Normal rate.  Pulmonary:     Effort: Pulmonary effort is normal.  Abdominal:     General: There is no distension.     Palpations: Abdomen is soft.  Tenderness: There is no abdominal tenderness.     Comments: (+) RUQ drain to gravity with small amount of very lightly blood tinged bilious output with multiple small stone fragments in bag. Flushed easily, aspirated clear bile and more stone fragments w/o blood noted in the syringe. Insertion site unremarkable, no active bleeding or drainage. Suture and stat lock in tact.  Neurological:     Mental Status: She is alert.     Imaging: No results found.  Labs:  CBC: Recent Labs    11/05/22 0011 11/06/22 0139 11/07/22 0333 11/08/22 0014  WBC 7.4 6.6 7.0 7.5  HGB  11.0* 11.8* 11.9* 11.9*  HCT 36.5 37.2 38.2 38.9  PLT 304 311 290 281    COAGS: Recent Labs    10/19/22 1123 10/20/22 0225 10/20/22 2145 10/21/22 0207 10/21/22 0640 10/22/22 0207 11/01/22 2222  INR 2.0*  --   --   --   --   --  1.5*  APTT  --    < > 89* 157* 70* 69*  --    < > = values in this interval not displayed.    BMP: Recent Labs    11/04/22 0003 11/05/22 0011 11/06/22 0139 11/07/22 0333  NA 139 140 142 137  K 3.9 4.2 3.7 3.8  CL 104 105 101 103  CO2 25 22 27 26   GLUCOSE 101* 87 108* 99  BUN 17 14 10 10   CALCIUM 8.7* 9.2 9.2 8.8*  CREATININE 1.04* 1.18* 0.83 0.90  GFRNONAA 54* 46* >60 >60    LIVER FUNCTION TESTS: Recent Labs    11/04/22 0003 11/05/22 0011 11/06/22 0139 11/07/22 0333  BILITOT 0.7 0.5 0.5 0.8  AST 93* 40 22 20  ALT 96* 64* 47* 34  ALKPHOS 153* 138* 116 100  PROT 6.0* 5.8* 5.9* 5.9*  ALBUMIN 2.9* 2.8* 2.9* 2.9*    Assessment and Plan:  82 y/o F with history of acute calculous cholecystitis s/p percutaneous cholecystostomy placement 10/21/22 in IR (Dr. Lowella Dandy) who presented to the ED 11/02/22 with epigastric pain and sudden bloody output from the drain insertion site as well as into the bag. She underwent drain exchange and upsize 11/02/22 in IR (Dr. Bryn Gulling) and is seen today for follow up.  Output in bag appears to be very slightly blood tinged however this is the original gravity bag placed during upsize 6/11 so I suspect the output appearance today is mostly due to new clear bile mixing with small amounts of blood remaining in the bag/tubing. Upon aspiration of the drain there is no blood or clots -- the bile is clear dark yellow with small black stone fragments. Hgb has remained stable since exchange. No further bleeding from tract per patient and son.  Drain Location: RUQ Size: Fr size: 12 Fr Date of placement: 10/21/22  Currently to: Drain collection device: gravity 24 hour output:  Output by Drain (mL) 11/06/22 0701 - 11/06/22 1900  11/06/22 1901 - 11/07/22 0700 11/07/22 0701 - 11/07/22 1900 11/07/22 1901 - 11/08/22 0700 11/08/22 0701 - 11/08/22 1157  Biliary Tube Cook slip-coat 12 Fr. RUQ 35 20 20 25      Interval imaging/drain manipulation:  Exchange 11/02/22  Current examination: Flushes/aspirates easily.  Insertion site unremarkable. Suture and stat lock in place. Dressed appropriately.   Discharge planning:  Patient planned for likely discharge today per hospitalist, drain output in bag is very slightly blood tinged clear bile but as stated above I think this is most likely due to remnants of  old blood in the bag mixing with new clear bile based on examination of the tubing and aspirate showing no blood or clots. She is appropriate for discharge from IR perspective.  Reviewed drain care instructions with patient and her son Laura Mendez to include every day flushes with 5 cc NS and monitor for further bloody output. I have sent a new gravity bag up to the floor which the RN will place prior to discharge so that they can accurately monitor for worsening bleeding.   They have follow up with general surgery next week to discuss potential cholecystectomy. Outpatient orders in for 6-8 week cholangiogram with exchange (IR schedulers will call to set this up). If she is not a candidate for cholecystectomy SpyGlass cholangioscopy in IR could be considered.  Please call IR with questions or concerns.  Electronically Signed: Villa Herb, PA-C 11/08/2022, 11:38 AM   I spent a total of 25 Minutes at the the patient's bedside AND on the patient's hospital floor or unit, greater than 50% of which was counseling/coordinating care for acute calculous cholecystitis.

## 2022-11-08 NOTE — Telephone Encounter (Signed)
Per chart pt has appt on 11/11/22.Marland KitchenTommi Rumps encounter...Raechel Chute

## 2022-11-08 NOTE — Plan of Care (Signed)

## 2022-11-08 NOTE — Discharge Summary (Signed)
Physician Discharge Summary   Patient: Laura Mendez MRN: [id number] DOB: 10/03/1940  Admit date:     11/01/2022  Discharge date: 11/08/22  Discharge Physician: Rickey Barbara   PCP: [name], MD   Recommendations at discharge:    Follow up with General Surgery as scheduled Follow up with IR as scheduled Follow up with PCP in 2-3 weeks, as needed  Discharge Diagnoses: Principal Problem:   Cholecystostomy tube dysfunction Active Problems:   Acute bronchitis   Asthma with COPD (HCC)   Paroxysmal atrial fibrillation (HCC)   Cholecystostomy tube dysfunction, initial encounter  Resolved Problems:   * No resolved hospital problems. *  Hospital Course: 82 y.o. female with medical history significant of asthma / COPD, PAF on eliquis, prior stroke / TIA. Pt presented with bloody output into recently placed cholecystostomy bag following recent admission for acute cholecystitis  Assessment and Plan: Cholecystostomy tube dysfunction -General Surgery had been following -Eliquis initially had been on hold, later resumed 6/14 per IR recs -Hgb has remained stable -Pt is now s/p upsizing of perc chole drain 6/11 -fluid in bag is thinner and more clear with slight tinge of blood  -Appreciate IR input. Drain fluid has cleared sufficiently and pt clear for d/c   Acute bronchitis Seen on CT scan O2 requirement initially, but weaned to room air -Continued on home scheduled and PRN nebs -completed rocephin   Paroxysmal atrial fibrillation (HCC) Cont amiodarone Rate controlled Resumed eliquis   Asthma with COPD (HCC) Cont home scheduled and PRN nebs. On minimal O2 support No audible wheezing    Consultants: General Surgery, IR Procedures performed:  Upsize of drain Disposition: Home Diet recommendation:  Cardiac diet DISCHARGE MEDICATION: Allergies as of 11/08/2022       Reactions   Clarithromycin Anaphylaxis   "just about killed me"   Aspirin Hives    Ibuprofen Hives   Codeine Nausea And Vomiting   Diltiazem Rash   Methotrexate Other (See Comments)    tongue swelling and mouth soreness   Tape Itching, Other (See Comments)   Tears skin   Tapentadol Itching   Verapamil Hcl Er Rash        Medication List     STOP taking these medications    sulfamethoxazole-trimethoprim 800-160 MG tablet Commonly known as: BACTRIM DS       TAKE these medications    albuterol 108 (90 Base) MCG/ACT inhaler Commonly known as: VENTOLIN HFA INHALE 2 PUFFS BY MOUTH EVERY 4 HOURS AS NEEDED What changed:  how much to take how to take this when to take this reasons to take this additional instructions   amiodarone 200 MG tablet Commonly known as: PACERONE Take 2 tablets (400 mg total) by mouth 2 (two) times daily for 5 days, THEN 1 tablet (200 mg total) daily for 21 days. Stop amiodarone after taking 200mg  daily for 21 days.. Start taking on: October 27, 2022   ammonium lactate 12 % cream Commonly known as: Lac-Hydrin Apply 1 Application topically as needed for dry skin.   Dupixent 300 MG/2ML Sopn Generic drug: Dupilumab Inject 300 mg into the skin every 14 (fourteen) days.   Eliquis 5 MG Tabs tablet Generic drug: apixaban Take 1 tablet by mouth twice daily   fluticasone-salmeterol 250-50 MCG/ACT Aepb Commonly known as: ADVAIR INHALE 1 PUFF INTO LUNGS TWICE DAILY What changed: See the new instructions.   letrozole 2.5 MG tablet Commonly known as: FEMARA Take 1 tablet by mouth once daily   multivitamin  tablet Take 1 tablet by mouth daily.   pantoprazole 40 MG tablet Commonly known as: PROTONIX Take 1 tablet (40 mg total) by mouth daily.   sodium chloride flush 0.9 % Soln injection Flush drain once daily with 5 mL of saline.   Normal Saline Flush 0.9 % Soln Flush drain once daily with 5ml of saline   triamcinolone cream 0.1 % Commonly known as: KENALOG Apply 1 Application topically 2 (two) times daily.         Follow-up Information     Berna Bue, MD. Go on 11/19/2022.   Specialty: General Surgery Contact information: [address] [place] [address] [phone]         Mir, Al Corpus, MD Follow up in 6 week(s).   Specialties: Interventional Radiology, Diagnostic Radiology, Radiology Why: IR scheduler will reach out to patient with details of appointment for percutaneous cholecystostomy drain exchange. Please call [phone] Monday-Friday 8a-5p with questions or concerns. Call [phone] with questions or concerns 24/7 to be connected to an on call radiologist. Contact information: [address] [place] [address] [phone]         [name], MD Follow up in 2 week(s).   Specialty: Internal Medicine Why: Hospital follow up Contact information: [address] [place] [address] [phone]                Discharge Exam: Ceasar Mons Weights   11/01/22 2148  Weight: 90 kg   General exam: Awake, laying in bed, in nad Respiratory system: Normal respiratory effort, no wheezing Cardiovascular system: regular rate, s1, s2 Gastrointestinal system: Soft, nondistended, positive BS Central nervous system: CN2-12 grossly intact, strength intact Extremities: Perfused, no clubbing Skin: Normal skin turgor, no notable skin lesions seen Psychiatry: Mood normal // no visual hallucinations   Condition at discharge: fair  The results of significant diagnostics from this hospitalization (including imaging, microbiology, ancillary and laboratory) are listed below for reference.   Imaging Studies: IR EXCHANGE BILIARY DRAIN  Result Date: 11/02/2022 INDICATION: 82 year old woman underwent cholecystostomy drain placement on 10/21/2022 for acute calculus cholecystitis. She developed bloody output within the bag and from around the catheter entry site last night. She presents today for evaluation. EXAM: Cholecystostomy  drain exchange and upsize MEDICATIONS: None ANESTHESIA/SEDATION: None FLUOROSCOPY: Radiation Exposure Index (as provided by the fluoroscopic device): 8 mGy Kerma COMPLICATIONS: None immediate. PROCEDURE: Informed written consent was obtained from the patient after a thorough discussion of the procedural risks, benefits and alternatives. All questions were addressed. Maximal Sterile Barrier Technique was utilized including caps, mask, sterile gowns, sterile gloves, sterile drape, hand hygiene and skin antiseptic. A timeout was performed prior to the initiation of the procedure. Scout image showed right upper quadrant cholecystostomy drain in appropriate position. Intraluminal positioning within the gallbladder was confirmed by administering contrast under fluoroscopy. Given slow oozing seen along the catheter entry site, decision made to upsize the drain. Following local administration, the existing drain was cut and removed over 0.035 inch guidewire and replaced with a 12 [place] drain (previously 10 [place]). Contrast administered through the new drain confirmed appropriate positioning within the gallbladder lumen. The drain was secured to skin with suture and connected to bag. IMPRESSION: Successful upsize of cholecystostomy drain from 10 [place] to 12 [place] in order to tamponade slow hemorrhage along catheter tract. Electronically Signed   By: Acquanetta Belling M.D.   On: 11/02/2022 16:16   CT CHEST ABDOMEN PELVIS W CONTRAST  Result Date: 11/02/2022 CLINICAL DATA:  Unintended weight loss. Drain tubing gallbladder that is now bleeding. EXAM: CT CHEST, ABDOMEN, AND PELVIS WITH CONTRAST TECHNIQUE: Multidetector CT imaging of the chest, abdomen and pelvis was performed following the standard protocol during bolus administration of intravenous contrast. RADIATION DOSE REDUCTION: This exam was performed according to the departmental dose-optimization program which includes automated exposure control, adjustment of the mA  and/or kV according to patient size and/or use of iterative reconstruction technique. CONTRAST:  75mL OMNIPAQUE IOHEXOL 350 MG/ML SOLN COMPARISON:  Abdominal ultrasound 11/01/2022; CT abdomen and pelvis 10/19/2022; CT chest 07/28/2006 FINDINGS: CT CHEST FINDINGS Cardiovascular: Mitral annular calcification. Coronary artery and aortic atherosclerotic calcification. Normal caliber aorta without dissection. Normal central pulmonary embolism. No pericardial effusion. Mediastinum/Nodes: Small hiatal hernia. Esophagus is otherwise unremarkable. No thoracic adenopathy. Lungs/Pleura: Emphysema. Bronchial wall thickening in the lower lobes with scattered mucous plugs. Clustered micro nodules in the left lower lobe. 5 mm subpleural nodule in the left lower lobe (5/92) stable since 2008 and benign. No follow-up recommended. Musculoskeletal: No acute fracture. CT ABDOMEN PELVIS FINDINGS Hepatobiliary: Cholecystostomy tube in the gallbladder. Multiple gallstones are present within the gallbladder. Gallbladder wall thickening and adjacent stranding, slightly improved from 10/19/2022. Tiny hepatic cysts. Liver is otherwise unremarkable. Pancreas: Fatty atrophy. Unchanged 2.9 cm cystic lesion at the tail of the pancreas. Spleen: Unremarkable. Adrenals/Urinary Tract: Stable adrenal glands. Bilateral nephrolithiasis. Unchanged 4 mm stone in the mid right ureter. No hydronephrosis. Unremarkable bladder. Stomach/Bowel: Normal caliber large and small bowel. No bowel wall thickening. Normal appendix. Stomach is within normal limits. Vascular/Lymphatic: Aortic atherosclerosis. No enlarged abdominal or pelvic lymph nodes. Reproductive: Hysterectomy.  No adnexal mass. Other: Multiple soft tissue nodules in the presacral space measuring up to 2.0 cm (series 3/image 108). Musculoskeletal: No acute fracture. IMPRESSION: CHEST 1. Lower lobe predominant airway infection/inflammation. 2. Emphysema. ABDOMEN/PELVIS 1. Cholecystostomy tube in place  within the gallbladder. 2. Cholelithiasis with slightly improved wall thickening and pericholecystic fluid compared with 10/19/2022. 3. Bilateral nephrolithiasis. Unchanged 4 mm stone in the mid right ureter. No hydronephrosis. 4. Multiple soft tissue nodules in the presacral space measuring up to 2.0 cm. These are indeterminate and malignancy is not excluded. Short interval follow-up in 3 months is recommended to ensure stability. 5. Unchanged 2.9 cm cystic lesion at the tail of the pancreas. Continued attention on follow-up. Aortic Atherosclerosis (ICD10-I70.0) and Emphysema (ICD10-J43.9). Electronically Signed   By: Minerva Fester M.D.   On: 11/02/2022 02:31   US Abdomen Limited RUQ (LIVER/GB)  Result Date: 11/01/2022 CLINICAL DATA:  Encounter for biliary drainage tubes EXAM: ULTRASOUND ABDOMEN LIMITED RIGHT UPPER QUADRANT COMPARISON:  CT abdomen and pelvis 10/19/2022 FINDINGS: Gallbladder: Gallbladder filled with stones. Mild wall thickening measuring 5 mm in thickness. Common bile duct: Diameter: 5 mm.  No intrahepatic biliary dilation. Liver: No focal lesion identified. Increased parenchymal echogenicity. Portal vein is patent on color Doppler imaging with normal direction of blood flow towards the liver. Other: None. IMPRESSION: 1. Cholelithiasis with mild wall thickening of the gallbladder suggesting cholecystitis. 2. Hepatic steatosis. Electronically Signed   By: Minerva Fester M.D.   On: 11/01/2022 23:08   DG Chest 2 View  Result Date: 11/01/2022 CLINICAL DATA:  Encounter for suspected sepsis. EXAM: CHEST - 2 VIEW COMPARISON:  Portable chest 10/24/2022. FINDINGS: The cardiac size is normal, with again noted prominent left main pulmonary artery, stable mediastinum. There is mild aortic atherosclerosis. There are scattered atelectatic bands in both bases. On the left there is also streaky  opacity in the lingula which could be due to atelectasis or a small pneumonia. Appearance of the frontal view  is similar to the prior study. On the lateral view, the lingular opacities were not seen on PA Lat chest 03/29/2022. The remaining lungs are clear.  No pleural collection is seen. Osteopenia and thoracic spondylosis. 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 IMPRESSION: Lingular opacity which could be atelectasis or a small pneumonia. Clinical correlation and radiographic follow-up recommended. Electronically Signed   By: Almira Bar M.D.   On: 11/01/2022 22:26   ECHOCARDIOGRAM LIMITED  Result Date: 10/25/2022    ECHOCARDIOGRAM LIMITED REPORT   Patient Name:   ACURA DISALVO Date of Exam: 10/25/2022 Medical Rec #:  [id number]              Height:       71.0 in Accession #:    [id number]             Weight:       198.4 lb Date of Birth:  01/30/1941               BSA:          2.101 m Patient Age:    82 years               BP:           108/67 mmHg Patient Gender: F                      HR:           115 bpm. Exam Location:  Inpatient Procedure: Limited Echo, Cardiac Doppler and Color Doppler Indications:    A-Fib  History:        Patient has prior history of Echocardiogram examinations, most                 recent 11/23/2021. Abnormal ECG, Arrythmias:Atrial Fibrillation;                 Risk Factors:Hypertension.  Sonographer:    Darlys Gales Referring Phys: [id number] [place] THOMAS O'NEAL IMPRESSIONS  1. Left ventricular ejection fraction, by estimation, is 60 to 65%. The left ventricle has normal function. The left ventricle has no regional wall motion abnormalities. There is mild concentric left ventricular hypertrophy. Left ventricular diastolic parameters are indeterminate.  2. Left atrial size was moderately dilated.  3. The mitral valve is degenerative. Trivial mitral valve regurgitation. Probably moderate mitral stenosis. The mean mitral valve gradient is 6.0 mmHg. Severe mitral annular calcification. Full doppler interrogation of the mitral valve was not done.  4. The aortic valve is tricuspid. No  doppler interrogation of aortic valve.  5. Tricuspid regurgitation signal is inadequate for assessing PA pressure.  6. IVC not visualized.  7. The patient was in atrial fibrillation.  8. Limited echo with minimal doppler, poor images. FINDINGS  Left Ventricle: Left ventricular ejection fraction, by estimation, is 60 to 65%. The left ventricle has normal function. The left ventricle has no regional wall motion abnormalities. There is mild concentric left ventricular hypertrophy. Left ventricular diastolic parameters are indeterminate. Right Ventricle: Tricuspid regurgitation signal is inadequate for assessing PA pressure. Left Atrium: Left atrial size was moderately dilated. Right Atrium: Right atrial size was normal in size. Mitral Valve: The mitral valve is degenerative in appearance. There is mild calcification of the mitral valve leaflet(s). Severe mitral annular calcification. Trivial mitral valve regurgitation. Moderate mitral valve stenosis. MV peak gradient,  13.0 mmHg. The mean mitral valve gradient is 6.0 mmHg. Aortic Valve: The aortic valve is tricuspid. Venous: The inferior vena cava was not well visualized. IAS/Shunts: No atrial level shunt detected by color flow Doppler. LEFT VENTRICLE PLAX 2D LVOT diam:     1.80 cm LVOT Area:     2.54 cm  LEFT ATRIUM              Index        RIGHT ATRIUM           Index LA Vol (A2C):   117.0 ml 55.68 ml/m  RA Area:     16.00 cm LA Vol (A4C):   81.8 ml  38.93 ml/m  RA Volume:   39.40 ml  18.75 ml/m LA Biplane Vol: 102.0 ml 48.54 ml/m  MITRAL VALVE MV Peak grad: 13.0 mmHg  SHUNTS MV Mean grad: 6.0 mmHg   Systemic Diam: 1.80 cm MV Vmax:      1.80 m/s MV Vmean:     110.0 cm/s Dalton McleanMD Electronically signed by Wilfred Lacy Signature Date/Time: 10/25/2022/3:42:04 PM    Final    DG CHEST PORT 1 VIEW  Result Date: 10/24/2022 CLINICAL DATA:  Diarrhea. EXAM: PORTABLE CHEST 1 VIEW COMPARISON:  03/29/2022. FINDINGS: Cardiac silhouette is normal in size. No  mediastinal masses. Prominent left pulmonary artery and right superior pulmonary vein, stable from prior exams. No hilar masses. Left greater than right lung base opacities suspected to be atelectasis, similar to the exam from 03/29/2022. Remainder of the lungs is clear. No convincing pleural effusion or pneumothorax. Right sided PICC has its tip in the mid superior vena cava. IMPRESSION: 1. Lung base opacities, left greater than right, suspected to be atelectasis/scarring. Consider infection if there are consistent clinical findings. 2. Right PICC, line tip in the mid superior vena cava. Electronically Signed   By: Amie Portland M.D.   On: 10/24/2022 09:32   [place] EKG SITE RITE  Result Date: 10/24/2022 If Site Rite image not attached, placement could not be confirmed due to current cardiac rhythm.  IR Perc Cholecystostomy  Addendum Date: 10/21/2022   ADDENDUM REPORT: 10/21/2022 17:16 ADDENDUM: Fluoroscopic and ultrasound images were taken and saved for documentation. Electronically Signed   By: Richarda Overlie M.D.   On: 10/21/2022 17:16   Result Date: 10/21/2022 INDICATION: 82 year old with cholelithiasis and concern for acute cholecystitis. Patient is not a good surgical candidate at this time. Plan for percutaneous cholecystostomy tube placement. EXAM: CHOLECYSTOSTOMY TUBE PLACEMENT WITH ULTRASOUND AND FLUOROSCOPIC GUIDANCE MEDICATIONS: Moderate sedation ANESTHESIA/SEDATION: Moderate (conscious) sedation was employed during this procedure. A total of Versed 0.5 mg and Fentanyl 25 mcg was administered intravenously by the radiology nurse. Total intra-service moderate Sedation Time: 18 minutes. The patient's level of consciousness and vital signs were monitored continuously by radiology nursing throughout the procedure under my direct supervision. FLUOROSCOPY: Radiation Exposure Index (as provided by the fluoroscopic device): 24 mGy Kerma CONTRAST:  10 mL Omnipaque 300 COMPLICATIONS: None immediate. PROCEDURE:  Informed consent was obtained after thorough discussion of the potential risks and benefits. All questions were addressed. Maximal Sterile Barrier Technique was utilized including caps, mask, sterile gowns, sterile gloves, sterile drape, hand hygiene and skin antiseptic. A timeout was performed prior to the initiation of the procedure. Right side of the abdomen was prepped and draped in sterile fashion. Ultrasound was used to identify the gallbladder. Skin was anesthetized using 1% lidocaine. Small incision was made. Using ultrasound guidance, 21 gauge needle was directed into  the gallbladder using a transhepatic approach. 0.018 wire was advanced in the gallbladder. Transitional dilator set was placed. Small amount of contrast was injected to confirm placement. J wire was placed and the tract was dilated to accommodate a 10 [place] multipurpose drain. 10 French drain was reconstituted in the gallbladder. 60 mL of cloudy orange fluid was removed. Gallbladder was decompressed at end of the procedure. Drain was flushed with saline and attached to a gravity bag. Drain was sutured to skin. FINDINGS: Gallbladder was markedly abnormal on ultrasound with multiple stones and asymmetric wall thickening. Drain was successfully placed using a transhepatic approach. Cholangiogram demonstrated multiple stones but there was filling of the common bile duct and duodenum. 60 mL of orange colored fluid was removed. Fluid was sent for culture. IMPRESSION: 1. Successful percutaneous cholecystostomy tube placement. 2. Cholelithiasis.  Cystic duct and common bile duct are patent. Electronically Signed: By: Richarda Overlie M.D. On: 10/21/2022 17:06   CT ABDOMEN PELVIS W CONTRAST  Result Date: 10/19/2022 CLINICAL DATA:  Abdominal pain EXAM: CT ABDOMEN AND PELVIS WITH CONTRAST TECHNIQUE: Multidetector CT imaging of the abdomen and pelvis was performed using the standard protocol following bolus administration of intravenous contrast. RADIATION  DOSE REDUCTION: This exam was performed according to the departmental dose-optimization program which includes automated exposure control, adjustment of the mA and/or kV according to patient size and/or use of iterative reconstruction technique. CONTRAST:  70mL OMNIPAQUE IOHEXOL 350 MG/ML SOLN COMPARISON:  None Available. FINDINGS: Lower chest: Heart is slightly enlarged. Significant calcifications along the mitral valve annulus. There is some linear opacity at the bases likely scar or atelectasis. 6 mm subpleural nodule left lower lobe on series 4, image 3. Small hiatal hernia. Hepatobiliary: Gallbladder is dilated with wall thickening, stones and stranding. Please correlate for acute cholecystitis. There may be a stone towards the neck as well. Underlying fatty liver infiltration. Patent portal vein. Pancreas: Severe fatty atrophy of the pancreas. There is a cystic lesion towards the tail of the pancreas measuring 2.7 by 2.5 cm. Series 3, image 27. Spleen: Normal in size without focal abnormality. Adrenals/Urinary Tract: The adrenal glands are preserved. Bilateral small renal stones. These punctate overall except for 4 mm lower pole right-sided stone. There is cyst along the lower pole left kidney measuring 2.2 cm with Hounsfield unit of 11. Bosniak 1 lesion. Tiny focus on the right, Bosniak 2 lesion. Again no specific imaging follow-up of the cysts. The ureters have normal course and caliber extending down to the bladder. Bladder is underdistended. Stomach/Bowel: There is some fluid in the stomach. Stomach is nondilated. The small bowel is nondilated. Large bowel has a normal course and caliber. Normal appendix. Vascular/Lymphatic: Normal caliber aorta and IVC. Mild vascular calcifications. No specific abnormal lymph node enlargement identified in the abdomen and pelvis. Replaced left hepatic artery from the left gastric. Reproductive: Status post hysterectomy. No adnexal masses. Other: Small amount of free fluid  dependently in the pelvis, nonspecific. No free air. Fat containing umbilical hernia. Musculoskeletal: Scattered degenerative changes along the spine with levoconvex curvature of the lower lumbar spine centered at L4. There is also multilevel trace retrolisthesis and multilevel stenosis. IMPRESSION: Abnormal gallbladder with dilatation and stones. There is significant wall thickening, edema and adjacent stranding consistent with a acute cholecystitis. Possible stone in the neck of the gallbladder as well. 2.7 cm cystic lesion along the tail of the pancreas. In the absence of prior would recommend follow up imaging in 6 months. Electronically Signed   By: Scarlette Shorts  Chales Abrahams M.D.   On: 10/19/2022 12:35   CT Head Wo Contrast  Result Date: 10/19/2022 CLINICAL DATA:  Headache, new onset. EXAM: CT HEAD WITHOUT CONTRAST TECHNIQUE: Contiguous axial images were obtained from the base of the skull through the vertex without intravenous contrast. RADIATION DOSE REDUCTION: This exam was performed according to the departmental dose-optimization program which includes automated exposure control, adjustment of the mA and/or kV according to patient size and/or use of iterative reconstruction technique. COMPARISON:  Head CT 06/28/2021.  MRI brain 06/28/2021. FINDINGS: Brain: No acute hemorrhage. Unchanged mild chronic small-vessel disease with old lacunar infarcts in the bilateral caudate heads. Cortical gray-white differentiation is otherwise preserved. Prominence of the ventricles and sulci within expected range for age. No hydrocephalus or extra-axial collection. No mass effect or midline shift. Vascular: No hyperdense vessel or unexpected calcification. Skull: No calvarial fracture or suspicious bone lesion. Skull base is unremarkable. Sinuses/Orbits: Unremarkable. Other: None IMPRESSION: 1. No acute intracranial abnormality. 2. Unchanged mild chronic small-vessel disease. Electronically Signed   By: Orvan Falconer M.D.   On:  10/19/2022 12:31    Microbiology: Results for orders placed or performed during the hospital encounter of 11/01/22  Culture, blood (Routine x 2)     Status: None   Collection Time: 11/01/22 10:30 PM   Specimen: BLOOD  Result Value Ref Range Status   Specimen Description BLOOD LEFT ANTECUBITAL  Final   Special Requests   Final    BOTTLES DRAWN AEROBIC AND ANAEROBIC Blood Culture adequate volume   Culture   Final    NO GROWTH 5 DAYS Performed at [facility], [address] [place], [place] [address]    Report Status 11/06/2022 FINAL  Final  Urine Culture     Status: Abnormal   Collection Time: 11/01/22 10:47 PM   Specimen: Urine, Random  Result Value Ref Range Status   Specimen Description URINE, RANDOM  Final   Special Requests   Final    NONE Reflexed from [phone] Performed at [facility], [address], [place], [place] [address]    Culture 50,000 COLONIES/mL STAPHYLOCOCCUS EPIDERMIDIS (A)  Final   Report Status 11/03/2022 FINAL  Final   Organism ID, Bacteria STAPHYLOCOCCUS EPIDERMIDIS (A)  Final      Susceptibility   Staphylococcus epidermidis - MIC*    CIPROFLOXACIN 4 RESISTANT Resistant     GENTAMICIN <=0.5 SENSITIVE Sensitive     NITROFURANTOIN <=16 SENSITIVE Sensitive     OXACILLIN >=4 RESISTANT Resistant     TETRACYCLINE <=1 SENSITIVE Sensitive     VANCOMYCIN 2 SENSITIVE Sensitive     TRIMETH/SULFA 160 RESISTANT Resistant     CLINDAMYCIN >=8 RESISTANT Resistant     RIFAMPIN <=0.5 SENSITIVE Sensitive     Inducible Clindamycin NEGATIVE Sensitive     * 50,000 COLONIES/mL STAPHYLOCOCCUS EPIDERMIDIS  Culture, blood (Routine x 2)     Status: None   Collection Time: 11/02/22  1:23 AM   Specimen: BLOOD  Result Value Ref Range Status   Specimen Description BLOOD LEFT ANTECUBITAL  Final   Special Requests   Final    BOTTLES DRAWN AEROBIC AND ANAEROBIC Blood Culture adequate volume   Culture   Final    NO GROWTH 5 DAYS Performed at [facility],  [address], [place], [place] [address]    Report Status 11/07/2022 FINAL  Final    Labs: CBC: Recent Labs  Lab 11/01/22 2222 11/02/22 0145 11/04/22 0003 11/05/22 0011 11/06/22 0139 11/07/22 9562  11/08/22 0014  WBC 9.7   < > 7.8 7.4 6.6 7.0 7.5  NEUTROABS 7.1  --   --   --   --   --   --   HGB 12.1   < > 11.1* 11.0* 11.8* 11.9* 11.9*  HCT 38.6   < > 36.4 36.5 37.2 38.2 38.9  MCV 91.5   < > 95.3 94.3 90.1 89.7 91.3  PLT 412*   < > 328 304 311 290 281   < > = values in this interval not displayed.   Basic Metabolic Panel: Recent Labs  Lab 11/03/22 0727 11/04/22 0003 11/05/22 0011 11/06/22 0139 11/07/22 0333  NA 140 139 140 142 137  K 4.4 3.9 4.2 3.7 3.8  CL 103 104 105 101 103  CO2 24 25 22 27 26   GLUCOSE 117* 101* 87 108* 99  BUN 10 17 14 10 10   CREATININE 0.96 1.04* 1.18* 0.83 0.90  CALCIUM 9.0 8.7* 9.2 9.2 8.8*   Liver Function Tests: Recent Labs  Lab 11/03/22 0727 11/04/22 0003 11/05/22 0011 11/06/22 0139 11/07/22 0333  AST 184* 93* 40 22 20  ALT 121* 96* 64* 47* 34  ALKPHOS 182* 153* 138* 116 100  BILITOT 0.6 0.7 0.5 0.5 0.8  PROT 6.2* 6.0* 5.8* 5.9* 5.9*  ALBUMIN 3.0* 2.9* 2.8* 2.9* 2.9*   CBG: No results for input(s): "GLUCAP" in the last 168 hours.  Discharge time spent: less than 30 minutes.  Signed: Rickey Barbara, MD [facility] 11/08/2022

## 2022-11-08 NOTE — Progress Notes (Signed)
Discharge instructions, medications, and follow-up appts reviewed with pt. & pt.'s son, both voice understanding of all instructions given. PIV to right wrist removed and dry dressing placed. Catheter tip intact and hemostasis achieved within expected timeframe. All belongings sent with pt. at time of discharge. Pt. discharged home in stable condition via personal vehicle at this time.

## 2022-11-10 ENCOUNTER — Telehealth: Payer: Self-pay | Admitting: Internal Medicine

## 2022-11-10 ENCOUNTER — Encounter: Payer: Self-pay | Admitting: *Deleted

## 2022-11-10 ENCOUNTER — Telehealth: Payer: Self-pay | Admitting: *Deleted

## 2022-11-10 ENCOUNTER — Other Ambulatory Visit: Payer: Self-pay | Admitting: Hematology and Oncology

## 2022-11-10 DIAGNOSIS — K8012 Calculus of gallbladder with acute and chronic cholecystitis without obstruction: Secondary | ICD-10-CM | POA: Diagnosis not present

## 2022-11-10 DIAGNOSIS — J440 Chronic obstructive pulmonary disease with acute lower respiratory infection: Secondary | ICD-10-CM | POA: Diagnosis not present

## 2022-11-10 DIAGNOSIS — I480 Paroxysmal atrial fibrillation: Secondary | ICD-10-CM | POA: Diagnosis not present

## 2022-11-10 DIAGNOSIS — I5032 Chronic diastolic (congestive) heart failure: Secondary | ICD-10-CM | POA: Diagnosis not present

## 2022-11-10 DIAGNOSIS — C50511 Malignant neoplasm of lower-outer quadrant of right female breast: Secondary | ICD-10-CM | POA: Diagnosis not present

## 2022-11-10 DIAGNOSIS — K869 Disease of pancreas, unspecified: Secondary | ICD-10-CM | POA: Diagnosis not present

## 2022-11-10 MED ORDER — LETROZOLE 2.5 MG PO TABS: 2.5000 mg | ORAL_TABLET | Freq: Every day | ORAL | 0 refills | Status: DC

## 2022-11-10 NOTE — Telephone Encounter (Signed)
Thayer Ohm from [facility] called for verbal orders or 'urgent care needs'   States pt went to hospital on 6.10 and has fallen twice since she has been home. She has a R elbow skin tear that was covered with gauze that is now stuck to the pt's skin.  Thayer Ohm is requesting verbal orders for wound care to treat the pt's elbow and to do an OT evaluation.   Please call Thayer Ohm to confirm: [phone]

## 2022-11-10 NOTE — Transitions of Care (Post Inpatient/ED Visit) (Signed)
11/10/2022  Name: Laura Mendez MRN: [id number] DOB: [birth year]  Today's TOC FU Call Status: Today's TOC FU Call Status:: Successful TOC FU Call Competed TOC FU Call Complete Date: 11/10/22  Transition Care Management Follow-up Telephone Call Date of Discharge: 11/08/22 Discharge Facility: [facility]) Type of Discharge: Inpatient Admission Primary Inpatient Discharge Diagnosis:: biliary frain malfunction- drain from prior hospitalization How have you been since you were released from the hospital?: Same (per son Laura Mendez: "she seems to be okay now that the drain issue has been fixed, but she had a fall and tore up her elbow; the home health PT sent a message to Dr. Okey Dupre asking for a nurse order, but we haven't heard anything back.") Any questions or concerns?: Yes Patient Questions/Concerns:: Fall post- most recent hospital discharge with need for addition of home health RN for wound care- verified home health PT has placed this request with PCP; verified patient has PCP office visit scheduled tomorrow Patient Questions/Concerns Addressed: Notified Provider of Patient Questions/Concerns, Other: (verified wound is currently not bleeding and family has covered with gauze: son is concerned due to patient being on ACT- and gauze is now dried onto wound; discussed process for addtional home health services initiation; messaged PCP re: same)  Items Reviewed: Did you receive and understand the discharge instructions provided?: Yes (thoroughly reviewed with patient's son who verbalizes good understanding of same) Medications obtained,verified, and reconciled?: Yes (Medications Reviewed) (Full medication reconciliation/ review completed; no concerns or discrepancies identified; confirmed patient obtained/ is taking all newly Rx'd medications as instructed; family-manages medications and denies questions/ concerns around medications today) Any new allergies since your discharge?: No Dietary  orders reviewed?: Yes Type of Diet Ordered:: Regular, healthy People in Home: alone Name of Support/Comfort Primary Source: Reports at baseline patient lives alone and is independent in self-care activities; currently temporarily residing with supportive local son Laura Mendez post- recent hospital discharges  Medications Reviewed Today: Medications Reviewed Today     Reviewed by Michaela Corner, RN (Registered Nurse) on 11/10/22 at 1327  Med List Status: <None>   Medication Order Taking? Sig Documenting Provider Last Dose Status Informant  albuterol (VENTOLIN HFA) 108 (90 Base) MCG/ACT inhaler [id number] Yes INHALE 2 PUFFS BY MOUTH EVERY 4 HOURS AS NEEDED  Patient taking differently: Inhale 2 puffs into the lungs every 4 (four) hours as needed for wheezing or shortness of breath.   Waymon Budge, MD Taking Active Child, Pharmacy Records  amiodarone (PACERONE) 200 MG tablet [id number] Yes Take 2 tablets (400 mg total) by mouth 2 (two) times daily for 5 days, THEN 1 tablet (200 mg total) daily for 21 days. Stop amiodarone after taking 200mg  daily for 21 days.Burnadette Pop, MD Taking Active            Med Note [facility], Elvin So   Tue Nov 02, 2022  5:35 AM) Currently taking 1 tablet daily   ammonium lactate (LAC-HYDRIN) 12 % cream [id number] Yes Apply 1 Application topically as needed for dry skin. Ellsworth Lennox, PA-C Taking Active Child, Pharmacy Records           Med Note [facility], Cathe Mons Nov 02, 2022  5:35 AM)    apixaban Everlene Balls) 5 MG TABS tablet [id number] Yes Take 1 tablet by mouth twice daily Lanier Prude, MD Taking Active Child, Pharmacy Records  DUPIXENT 300 MG/2ML [facility] [id number] Yes Inject 300 mg into the skin every 14 (fourteen) days. [provider] Taking Active Pharmacy Records,  Child           Med Note ([facility], Elvin So   Tue Nov 02, 2022  5:10 AM)    fluticasone-salmeterol (ADVAIR) 250-50 MCG/ACT AEPB [id number] Yes INHALE 1 PUFF INTO LUNGS TWICE DAILY  Patient taking  differently: Inhale 1 puff into the lungs in the morning and at bedtime.   Waymon Budge, MD Taking Active Child, Pharmacy Records  letrozole [facility]) 2.5 MG tablet [id number] Yes Take 1 tablet by mouth once daily Serena Croissant, MD Taking Active Child, Pharmacy Records  Mepolizumab SOLR 100 mg [id number]   Waymon Budge, MD  Active   Multiple Vitamin (MULTIVITAMIN) tablet [id number] Yes Take 1 tablet by mouth daily. [provider] Taking Active Pharmacy Records, Child  pantoprazole (PROTONIX) 40 MG tablet [id number] Yes Take 1 tablet (40 mg total) by mouth daily. [name], MD Taking Active   Sodium Chloride Flush (NORMAL SALINE FLUSH) 0.9 % SOLN [id number] Yes Flush drain once daily with 5ml of saline Kennieth Francois, PA Taking Active            Med Note Otelia Limes Nov 10, 2022  1:26 PM) 11/10/22: son reports he was finally able to obtain needed saline for drain flushing at time of hospital discharge on 11/08/22: reports had requested 10/28/22 at time of prior discharge but "never received Rx"    sodium chloride flush 0.9 % SOLN injection [id number]  Flush drain once daily with 5 mL of saline. Kennieth Francois, PA  Active            Med Note Otelia Limes Nov 10, 2022  1:25 PM) 11/10/22: son reports he was finally able to obtain needed saline for drain flushing at time of hospital discharge on 11/08/22: reports had requested 10/28/22 at time of prior discharge but "never received Rx"  triamcinolone cream (KENALOG) 0.1 % [id number] Yes Apply 1 Application topically 2 (two) times daily. Ellsworth Lennox, PA-C Taking Active Child, Pharmacy Records            Home Care and Equipment/Supplies: Were Home Health Services Ordered?: Yes Name of Home Health Agency:: [facility]-- PT active: reports needs RN and OT order asap- son confirms PT that came to home today has requested same; I will also message PCP; confirmed son will attend PCP office visit with patient tomorrow  11/11/22 Has Agency set up a time to come to your home?: Yes First Home Health Visit Date: 11/10/22 Any new equipment or medical supplies ordered?: No  Functional Questionnaire: Do you need assistance with bathing/showering or dressing?: Yes (son assists with care needs as indicated after recent hospitalizations) Do you need assistance with meal preparation?: Yes (son assists with care needs as indicated after recent hospitalizations) Do you need assistance with eating?: No Do you have difficulty maintaining continence: No Do you need assistance with getting out of bed/getting out of a chair/moving?: Yes (son assists with care needs as indicated after recent hospitalizations) Do you have difficulty managing or taking your medications?: Yes (son currently manages medications: patient was independent in medication management prior to recent hospitalizations)  Follow up appointments reviewed: PCP Follow-up appointment confirmed?: Yes Date of PCP follow-up appointment?: 11/11/22 Follow-up Provider: PCP Specialist Hospital Follow-up appointment confirmed?: Yes Date of Specialist follow-up appointment?: 11/15/22 Follow-Up Specialty Provider:: cardiology provider; has scheduled office visit with surgical provider on 11/19/22 Do you need transportation to your follow-up appointment?: No Do you understand care options if  your condition(s) worsen?: Yes-patient verbalized understanding  SDOH Interventions Today    Flowsheet Row Most Recent Value  SDOH Interventions   Food Insecurity Interventions Intervention Not Indicated  Transportation Interventions Intervention Not Indicated  [normally drives self,  son assisting/ providing all transportation after 2 recent hospitalizations]      TOC Interventions Today    Flowsheet Row Most Recent Value  TOC Interventions   TOC Interventions Discussed/Reviewed TOC Interventions Discussed, Post op wound/incision care  [provided my direct contact  information should questions/ concerns/ needs arise post-TOC call, prior to RN CM telephone visit 11/17/22]      Interventions Today    Flowsheet Row Most Recent Value  Chronic Disease   Chronic disease during today's visit Atrial Fibrillation (AFib), Other  [recent cholecystitis with drain placement and subsequent drain malfunction requiring re-admission]  General Interventions   General Interventions Discussed/Reviewed General Interventions Discussed, Doctor Visits, Referral to Nurse  Doctor Visits Discussed/Reviewed Doctor Visits Discussed, Specialist, PCP  PCP/Specialist Visits Compliance with follow-up visit  Communication with PCP/Specialists, RN  Education Interventions   Education Provided Provided Education  Provided Verbal Education On Other  [process to have additional home health disciplines ordered]  Nutrition Interventions   Nutrition Discussed/Reviewed Nutrition Discussed  Pharmacy Interventions   Pharmacy Dicussed/Reviewed Pharmacy Topics Discussed  [Full medication review with updating medication list in EHR per patient report]  Safety Interventions   Safety Discussed/Reviewed Safety Discussed, Fall Risk      Caryl Pina, RN, BSN, CCRN Alumnus RN CM Care Coordination/ Transition of Care- [facility] Care Management [phone]: direct office

## 2022-11-11 ENCOUNTER — Ambulatory Visit (INDEPENDENT_AMBULATORY_CARE_PROVIDER_SITE_OTHER): Payer: Medicare Other | Admitting: Internal Medicine

## 2022-11-11 ENCOUNTER — Encounter: Payer: Self-pay | Admitting: Internal Medicine

## 2022-11-11 VITALS — BP 120/64 | HR 68 | Temp 98.3°F | Ht 71.0 in | Wt 200.0 lb

## 2022-11-11 DIAGNOSIS — N179 Acute kidney failure, unspecified: Secondary | ICD-10-CM | POA: Diagnosis not present

## 2022-11-11 DIAGNOSIS — Z48815 Encounter for surgical aftercare following surgery on the digestive system: Secondary | ICD-10-CM | POA: Diagnosis not present

## 2022-11-11 DIAGNOSIS — J439 Emphysema, unspecified: Secondary | ICD-10-CM | POA: Diagnosis not present

## 2022-11-11 DIAGNOSIS — K810 Acute cholecystitis: Secondary | ICD-10-CM

## 2022-11-11 DIAGNOSIS — I480 Paroxysmal atrial fibrillation: Secondary | ICD-10-CM

## 2022-11-11 DIAGNOSIS — I081 Rheumatic disorders of both mitral and tricuspid valves: Secondary | ICD-10-CM | POA: Diagnosis not present

## 2022-11-11 DIAGNOSIS — I5032 Chronic diastolic (congestive) heart failure: Secondary | ICD-10-CM | POA: Diagnosis not present

## 2022-11-11 DIAGNOSIS — K8012 Calculus of gallbladder with acute and chronic cholecystitis without obstruction: Secondary | ICD-10-CM | POA: Diagnosis not present

## 2022-11-11 DIAGNOSIS — Z7901 Long term (current) use of anticoagulants: Secondary | ICD-10-CM

## 2022-11-11 DIAGNOSIS — K869 Disease of pancreas, unspecified: Secondary | ICD-10-CM | POA: Diagnosis not present

## 2022-11-11 DIAGNOSIS — I2510 Atherosclerotic heart disease of native coronary artery without angina pectoris: Secondary | ICD-10-CM | POA: Diagnosis not present

## 2022-11-11 DIAGNOSIS — I700 Atherosclerosis of aorta: Secondary | ICD-10-CM | POA: Diagnosis not present

## 2022-11-11 DIAGNOSIS — Z87891 Personal history of nicotine dependence: Secondary | ICD-10-CM

## 2022-11-11 DIAGNOSIS — I69318 Other symptoms and signs involving cognitive functions following cerebral infarction: Secondary | ICD-10-CM

## 2022-11-11 DIAGNOSIS — I4892 Unspecified atrial flutter: Secondary | ICD-10-CM

## 2022-11-11 DIAGNOSIS — J209 Acute bronchitis, unspecified: Secondary | ICD-10-CM | POA: Diagnosis not present

## 2022-11-11 DIAGNOSIS — F01B0 Vascular dementia, moderate, without behavioral disturbance, psychotic disturbance, mood disturbance, and anxiety: Secondary | ICD-10-CM

## 2022-11-11 DIAGNOSIS — C50511 Malignant neoplasm of lower-outer quadrant of right female breast: Secondary | ICD-10-CM | POA: Diagnosis not present

## 2022-11-11 DIAGNOSIS — J440 Chronic obstructive pulmonary disease with acute lower respiratory infection: Secondary | ICD-10-CM | POA: Diagnosis not present

## 2022-11-11 NOTE — Telephone Encounter (Signed)
LVM on secure VS to give pt ok verbals

## 2022-11-11 NOTE — Telephone Encounter (Signed)
We cannot give verbals without face to face. She should keep visit today with me

## 2022-11-11 NOTE — Telephone Encounter (Signed)
Pt was seen today and will wait on MD approval before giving ok verbals

## 2022-11-11 NOTE — Telephone Encounter (Signed)
Okay for all verbals needed including added wound care.

## 2022-11-11 NOTE — Progress Notes (Signed)
   Subjective:   Patient ID: Laura Mendez, female    DOB: 08/24/1940, 82 y.o.   MRN: [id number]  HPI The patient is an 82 YO female coming in for follow up 2 recent hospital visits (first with shock/sepsis and cholecystitis drain placed), then admission with blood coming from drain and requiring replacement of drain. She is overall recovering but weak. She is eating and drinking well. Walking around some. Sleeping okay. Normal BM. No nausea or vomiting. Tube draining well. Seeing surgeon next week hoping to schedule gallbladder removal.   PMH, FMH, social history reviewed and updated  Review of Systems  Constitutional:  Positive for activity change and fatigue. Negative for appetite change.  HENT: Negative.    Eyes: Negative.   Respiratory:  Negative for cough, chest tightness and shortness of breath.   Cardiovascular:  Negative for chest pain, palpitations and leg swelling.  Gastrointestinal:  Negative for abdominal distention, abdominal pain, constipation, diarrhea, nausea and vomiting.  Musculoskeletal: Negative.   Skin:  Positive for wound.  Neurological:  Positive for weakness.  Psychiatric/Behavioral: Negative.      Objective:  Physical Exam Constitutional:      Appearance: She is well-developed.  HENT:     Head: Normocephalic and atraumatic.  Cardiovascular:     Rate and Rhythm: Normal rate and regular rhythm.  Pulmonary:     Effort: Pulmonary effort is normal. No respiratory distress.     Breath sounds: Normal breath sounds. No wheezing or rales.  Abdominal:     General: Bowel sounds are normal. There is no distension.     Palpations: Abdomen is soft.     Tenderness: There is no abdominal tenderness. There is no rebound.     Comments: Drain in place with bilious fluid  Musculoskeletal:     Cervical back: Normal range of motion.  Skin:    General: Skin is warm and dry.     Comments: Skin tear right arm near elbow no signs of infection and bandaged during visit   Neurological:     Mental Status: She is alert and oriented to person, place, and time.     Coordination: Coordination normal.     Vitals:   11/11/22 0846  BP: 120/64  Pulse: 68  Temp: 98.3 F (36.8 C)  TempSrc: Oral  SpO2: 96%  Weight: 200 lb (90.7 kg)  Height: 5\' 11"  (1.803 m)    Assessment & Plan:  Visit time 25 minutes in face to face communication with patient and coordination of care, additional 10 minutes spent in record review, coordination or care, ordering tests, communicating/referring to other healthcare professionals, documenting in medical records all on the same day of the visit for total time 35 minutes spent on the visit.

## 2022-11-12 NOTE — Assessment & Plan Note (Signed)
Sounds regular today no EKG done. Continue eliquis 5 mg BID and amiodarone.

## 2022-11-12 NOTE — Assessment & Plan Note (Signed)
Recovered prior to discharge no labs needed today.

## 2022-11-12 NOTE — Assessment & Plan Note (Signed)
No active infection today, drain in place with bilious fluid draining. They have follow up with surgeon and will need cholecystectomy.

## 2022-11-14 NOTE — Progress Notes (Unsigned)
Cardiology Office Note:  .   Date:  11/15/2022  ID:  Laura Mendez, DOB Feb 23, 1941, MRN [id number] PCP: [name], MD  [facility] Providers Cardiologist:  Donato Schultz, MD Electrophysiologist:  Lanier Prude, MD {  History of Present Illness: .   Laura Mendez is a 82 y.o. female with a past medical history of atrial fibrillation/flutter and TIA who presents for follow-up appointment.  Atrial flutter was originally diagnosed 12/11/2015 and she converted after receiving 5 mg of metoprolol.  Asthma seemed to exacerbate this.  Had been on Eliquis with no major bleeding.  Echo 2017 showed normal EF with severely dilated left atrium of 55.  Had an MI 2023 with cardiac catheterization showing no significant flow-limiting CAD.  Dr. Lalla Brothers discussed holding off on ablation in 2023.  Cardizem was previously stopped because of rash.  Rapid was started.  Methotrexate was started by dermatology but she was now off of it.  The rash did not seem to be related to the drugs.  Still is having issues with rash.  Denied chest pain, fevers, chills, nausea, vomiting, syncope when she was last seen 07/22/2022.   Gallbladder was inflammed and put in a drain, couldn't do surgery due to the aFIB. Sunday night her tube fell out. Put in a larger tube, appetite came back and she felt better, 8 days in there.   Today, she tells me that she is having issues with her gallbladder.  She currently has a drain in place.  They could not do her surgery because she was in A-fib and her heart rate was high.  She has been maintaining normal sinus rhythm and has been compliant with her amiodarone and Eliquis.  No further issues with atrial fibrillation.  She is seeing the surgeon on Friday and plans to set up the lap chole.  Advised that they cannot send clearance to our preop pool.  I have done a DASI and she does not quite meet METS.  This is likely due to her current acute state with drain in  place.  She is doing PT twice a week.  We will have to send Eliquis holding request to our pharmacy team once we have the clearance.  ROS: Pertinent ROS located in HPI  Studies Reviewed: .        Cardiac cath on 11/23/21 revealed: 1) Patent coronary arteries, right dominant, with mild irregularity but no significant stenoses. 2) Normal LVEDP Suspect demand ischemic event. The patient does not have any high-grade CAD.   Risk Assessment/Calculations:    CHA2DS2-VASc Score = 5   This indicates a 7.2% annual risk of stroke. The patient's score is based upon: CHF History: 0 HTN History: 0 Diabetes History: 0 Stroke History: 2 Vascular Disease History: 0 Age Score: 2 Gender Score: 1            Physical Exam:   VS:  BP 112/60   Pulse 63   Ht 5\' 10"  (1.778 m)   Wt 198 lb (89.8 kg)   SpO2 94%   BMI 28.41 kg/m    Wt Readings from Last 3 Encounters:  11/15/22 198 lb (89.8 kg)  11/11/22 200 lb (90.7 kg)  11/01/22 198 lb 6.6 oz (90 kg)    GEN: Well nourished, well developed in no acute distress NECK: No JVD; No carotid bruits CARDIAC: RRR, no murmurs, rubs, gallops RESPIRATORY:  Clear to auscultation without rales, wheezing or rhonchi  ABDOMEN: Soft, non-tender, non-distended EXTREMITIES:  No  edema; rash present on both arms, red and raw  ASSESSMENT AND PLAN: .   1.  Preop clearance  Ms. Schmelzle's perioperative risk of a major cardiac event is 11% according to the Revised Cardiac Risk Index (RCRI).  Therefore, she is at high risk for perioperative complications.   Her functional capacity is poor at 3.97 METs according to the [facility] Activity Status Index (DASI). Recommendations: According to ACC/AHA guidelines, no further cardiovascular testing needed.  The patient may proceed to surgery at acceptable risk.   Antiplatelet and/or Anticoagulation Recommendations: Eliquis holding parameters will need to be forwarded to Pharm.D.  Per Dr. Anne Fu:  Just had cath 2023. OK to  proceed.   2. Typical atrial flutter/fibrillation (paroxysmal) -no racing heart beats, no SOB.  -Continue amiodarone 200 mg for the next 21 days (will be done next week) -Continue Eliquis 5 mg twice a day  3.  Chronic anticoagulation -No evidence of bleeding -Continue Eliquis 5 mg twice a day  3.  Type II MI -No further chest pain -Continue current medication regimen which includes amiodarone 200 mg daily, Eliquis 5 mg twice a day -BP well controlled  4.  Stroke/TIA, Feb 2023 -some short term memory loss  -no weakness       Dispo: Follow-up with Dr. Anne Fu at next available appointment, also needs follow-up with Dr. Lalla Brothers  Signed, Sharlene Dory, PA-C

## 2022-11-15 ENCOUNTER — Encounter: Payer: Self-pay | Admitting: Physician Assistant

## 2022-11-15 ENCOUNTER — Ambulatory Visit: Payer: Medicare Other | Attending: Physician Assistant | Admitting: Physician Assistant

## 2022-11-15 VITALS — BP 112/60 | HR 63 | Ht 70.0 in | Wt 198.0 lb

## 2022-11-15 DIAGNOSIS — I214 Non-ST elevation (NSTEMI) myocardial infarction: Secondary | ICD-10-CM

## 2022-11-15 DIAGNOSIS — I5032 Chronic diastolic (congestive) heart failure: Secondary | ICD-10-CM

## 2022-11-15 DIAGNOSIS — K8012 Calculus of gallbladder with acute and chronic cholecystitis without obstruction: Secondary | ICD-10-CM | POA: Diagnosis not present

## 2022-11-15 DIAGNOSIS — K869 Disease of pancreas, unspecified: Secondary | ICD-10-CM | POA: Diagnosis not present

## 2022-11-15 DIAGNOSIS — I480 Paroxysmal atrial fibrillation: Secondary | ICD-10-CM | POA: Diagnosis not present

## 2022-11-15 DIAGNOSIS — I6340 Cerebral infarction due to embolism of unspecified cerebral artery: Secondary | ICD-10-CM | POA: Diagnosis not present

## 2022-11-15 DIAGNOSIS — J440 Chronic obstructive pulmonary disease with acute lower respiratory infection: Secondary | ICD-10-CM | POA: Diagnosis not present

## 2022-11-15 DIAGNOSIS — I21A1 Myocardial infarction type 2: Secondary | ICD-10-CM

## 2022-11-15 DIAGNOSIS — C50511 Malignant neoplasm of lower-outer quadrant of right female breast: Secondary | ICD-10-CM | POA: Diagnosis not present

## 2022-11-15 DIAGNOSIS — J45909 Unspecified asthma, uncomplicated: Secondary | ICD-10-CM

## 2022-11-15 NOTE — Patient Instructions (Signed)
Medication Instructions:   Your physician recommends that you continue on your current medications as directed. Please refer to the Current Medication list given to you today.  *If you need a refill on your cardiac medications before your next appointment, please call your pharmacy*   Lab Work: NONE ORDERED  TODAY    If you have labs (blood work) drawn today and your tests are completely normal, you will receive your results only by: MyChart Message (if you have MyChart) OR A paper copy in the mail If you have any lab test that is abnormal or we need to change your treatment, we will call you to review the results.   Testing/Procedures:  NONE ORDERED  TODAY    Follow-Up: At [facility], you and your health needs are our priority.  As part of our continuing mission to provide you with exceptional heart care, we have created designated Provider Care Teams.  These Care Teams include your primary Cardiologist (physician) and Advanced Practice Providers (APPs -  Physician Assistants and Nurse Practitioners) who all work together to provide you with the care you need, when you need it.  We recommend signing up for the patient portal called "MyChart".  Sign up information is provided on this After Visit Summary.  MyChart is used to connect with patients for Virtual Visits (Telemedicine).  Patients are able to view lab/test results, encounter notes, upcoming appointments, etc.  Non-urgent messages can be sent to your provider as well.   To learn more about what you can do with MyChart, go to [email or web address].    Your next appointment:  You have been referred to [facility] Allergy    6 month(s)  Provider:     Dr. Anne Fu   Other Instructions  Low-Sodium Eating Plan Salt (sodium) helps you keep a healthy balance of fluids in your body. Too much sodium can raise your blood pressure. It can also cause fluid and waste to be held in your body. Your health care provider or  dietitian may recommend a low-sodium eating plan if you have high blood pressure (hypertension), kidney disease, liver disease, or heart failure. Eating less sodium can help lower your blood pressure and reduce swelling. It can also protect your heart, liver, and kidneys. What are tips for following this plan? Reading food labels  Check food labels for the amount of sodium per serving. If you eat more than one serving, you must multiply the listed amount by the number of servings. Choose foods with less than 140 milligrams (mg) of sodium per serving. Avoid foods with 300 mg of sodium or more per serving. Always check how much sodium is in a product, even if the label says "unsalted" or "no salt added." Shopping  Buy products labeled as "low-sodium" or "no salt added." Buy fresh foods. Avoid canned foods and pre-made or frozen meals. Avoid canned, cured, or processed meats. Buy breads that have less than 80 mg of sodium per slice. Cooking  Eat more home-cooked food. Try to eat less restaurant, buffet, and fast food. Try not to add salt when you cook. Use salt-free seasonings or herbs instead of table salt or sea salt. Check with your provider or pharmacist before using salt substitutes. Cook with plant-based oils, such as canola, sunflower, or olive oil. Meal planning When eating at a restaurant, ask if your food can be made with less salt or no salt. Avoid dishes labeled as brined, pickled, cured, or smoked. Avoid dishes made with soy sauce, miso, or  teriyaki sauce. Avoid foods that have monosodium glutamate (MSG) in them. MSG may be added to some restaurant food, sauces, soups, bouillon, and canned foods. Make meals that can be grilled, baked, poached, roasted, or steamed. These are often made with less sodium. General information Try to limit your sodium intake to 1,500-2,300 mg each day, or the amount told by your provider. What foods should I eat? Fruits Fresh, frozen, or canned  fruit. Fruit juice. Vegetables Fresh or frozen vegetables. "No salt added" canned vegetables. "No salt added" tomato sauce and paste. Low-sodium or reduced-sodium tomato and vegetable juice. Grains Low-sodium cereals, such as oats, puffed wheat and rice, and shredded wheat. Low-sodium crackers. Unsalted rice. Unsalted pasta. Low-sodium bread. Whole grain breads and whole grain pasta. Meats and other proteins Fresh or frozen meat, poultry, seafood, and fish. These should have no added salt. Low-sodium canned tuna and salmon. Unsalted nuts. Dried peas, beans, and lentils without added salt. Unsalted canned beans. Eggs. Unsalted nut butters. Dairy Milk. Soy milk. Cheese that is naturally low in sodium, such as ricotta cheese, fresh mozzarella, or Swiss cheese. Low-sodium or reduced-sodium cheese. Cream cheese. Yogurt. Seasonings and condiments Fresh and dried herbs and spices. Salt-free seasonings. Low-sodium mustard and ketchup. Sodium-free salad dressing. Sodium-free light mayonnaise. Fresh or refrigerated horseradish. Lemon juice. Vinegar. Other foods Homemade, reduced-sodium, or low-sodium soups. Unsalted popcorn and pretzels. Low-salt or salt-free chips. The items listed above may not be all the foods and drinks you can have. Talk to a dietitian to learn more. What foods should I avoid? Vegetables Sauerkraut, pickled vegetables, and relishes. Olives. [place] fries. Onion rings. Regular canned vegetables, except low-sodium or reduced-sodium items. Regular canned tomato sauce and paste. Regular tomato and vegetable juice. Frozen vegetables in sauces. Grains Instant hot cereals. Bread stuffing, pancake, and biscuit mixes. Croutons. Seasoned rice or pasta mixes. Noodle soup cups. Boxed or frozen macaroni and cheese. Regular salted crackers. Self-rising flour. Meats and other proteins Meat or fish that is salted, canned, smoked, spiced, or pickled. Precooked or cured meat, such as sausages or meat  loaves. Tomasa Blase. Ham. Pepperoni. Hot dogs. Corned beef. Chipped beef. Salt pork. Jerky. Pickled herring, anchovies, and sardines. Regular canned tuna. Salted nuts. Dairy Processed cheese and cheese spreads. Hard cheeses. Cheese curds. Blue cheese. Feta cheese. String cheese. Regular cottage cheese. Buttermilk. Canned milk. Fats and oils Salted butter. Regular margarine. Ghee. Bacon fat. Seasonings and condiments Onion salt, garlic salt, seasoned salt, table salt, and sea salt. Canned and packaged gravies. Worcestershire sauce. Tartar sauce. Barbecue sauce. Teriyaki sauce. Soy sauce, including reduced-sodium soy sauce. Steak sauce. Fish sauce. Oyster sauce. Cocktail sauce. Horseradish that you find on the shelf. Regular ketchup and mustard. Meat flavorings and tenderizers. Bouillon cubes. Hot sauce. Pre-made or packaged marinades. Pre-made or packaged taco seasonings. Relishes. Regular salad dressings. Salsa. Other foods Salted popcorn and pretzels. Corn chips and puffs. Potato and tortilla chips. Canned or dried soups. Pizza. Frozen entrees and pot pies. The items listed above may not be all the foods and drinks you should avoid. Talk to a dietitian to learn more. This information is not intended to replace advice given to you by your health care provider. Make sure you discuss any questions you have with your health care provider. Document Revised: 05/27/2022 Document Reviewed: 05/27/2022 Elsevier Patient Education  2024 Elsevier Inc.    Heart-Healthy Eating Plan Eating a healthy diet is important for the health of your heart. A heart-healthy eating plan includes: Eating less unhealthy fats. Eating more  healthy fats. Eating less salt in your food. Salt is also called sodium. Making other changes in your diet. Talk with your doctor or a diet specialist (dietitian) to create an eating plan that is right for you. What is my plan? Your doctor may recommend an eating plan that includes: Total  fat: ______% or less of total calories a day. Saturated fat: ______% or less of total calories a day. Cholesterol: less than _________mg a day. Sodium: less than _________mg a day. What are tips for following this plan? Cooking Avoid frying your food. Try to bake, boil, grill, or broil it instead. You can also reduce fat by: Removing the skin from poultry. Removing all visible fats from meats. Steaming vegetables in water or broth. Meal planning  At meals, divide your plate into four equal parts: Fill one-half of your plate with vegetables and green salads. Fill one-fourth of your plate with whole grains. Fill one-fourth of your plate with lean protein foods. Eat 2-4 cups of vegetables per day. One cup of vegetables is: 1 cup (91 g) broccoli or cauliflower florets. 2 medium carrots. 1 large bell pepper. 1 large sweet potato. 1 large tomato. 1 medium white potato. 2 cups (150 g) raw leafy greens. Eat 1-2 cups of fruit per day. One cup of fruit is: 1 small apple 1 large banana 1 cup (237 g) mixed fruit, 1 large orange,  cup (82 g) dried fruit, 1 cup (240 mL) 100% fruit juice. Eat more foods that have soluble fiber. These are apples, broccoli, carrots, beans, peas, and barley. Try to get 20-30 g of fiber per day. Eat 4-5 servings of nuts, legumes, and seeds per week: 1 serving of dried beans or legumes equals  cup (90 g) cooked. 1 serving of nuts is  oz (12 almonds, 24 pistachios, or 7 walnut halves). 1 serving of seeds equals  oz (8 g). General information Eat more home-cooked food. Eat less restaurant, buffet, and fast food. Limit or avoid alcohol. Limit foods that are high in starch and sugar. Avoid fried foods. Lose weight if you are overweight. Keep track of how much salt (sodium) you eat. This is important if you have high blood pressure. Ask your doctor to tell you more about this. Try to add vegetarian meals each week. Fats Choose healthy fats. These include  olive oil and canola oil, flaxseeds, walnuts, almonds, and seeds. Eat more omega-3 fats. These include salmon, mackerel, sardines, tuna, flaxseed oil, and ground flaxseeds. Try to eat fish at least 2 times each week. Check food labels. Avoid foods with trans fats or high amounts of saturated fat. Limit saturated fats. These are often found in animal products, such as meats, butter, and cream. These are also found in plant foods, such as palm oil, palm kernel oil, and coconut oil. Avoid foods with partially hydrogenated oils in them. These have trans fats. Examples are stick margarine, some tub margarines, cookies, crackers, and other baked goods. What foods should I eat? Fruits All fresh, canned (in natural juice), or frozen fruits. Vegetables Fresh or frozen vegetables (raw, steamed, roasted, or grilled). Green salads. Grains Most grains. Choose whole wheat and whole grains most of the time. Rice and pasta, including brown rice and pastas made with whole wheat. Meats and other proteins Lean, well-trimmed beef, veal, pork, and lamb. Chicken and [place] without skin. All fish and shellfish. Wild duck, rabbit, pheasant, and venison. Egg whites or low-cholesterol egg substitutes. Dried beans, peas, lentils, and tofu. Seeds and most  nuts. Dairy Low-fat or nonfat cheeses, including ricotta and mozzarella. Skim or 1% milk that is liquid, powdered, or evaporated. Buttermilk that is made with low-fat milk. Nonfat or low-fat yogurt. Fats and oils Non-hydrogenated (trans-free) margarines. Vegetable oils, including soybean, sesame, sunflower, olive, peanut, safflower, corn, canola, and cottonseed. Salad dressings or mayonnaise made with a vegetable oil. Beverages Mineral water. Coffee and tea. Diet carbonated beverages. Sweets and desserts Sherbet, gelatin, and fruit ice. Small amounts of dark chocolate. Limit all sweets and desserts. Seasonings and condiments All seasonings and condiments. The items  listed above may not be a complete list of foods and drinks you can eat. Contact a dietitian for more options. What foods should I avoid? Fruits Canned fruit in heavy syrup. Fruit in cream or butter sauce. Fried fruit. Limit coconut. Vegetables Vegetables cooked in cheese, cream, or butter sauce. Fried vegetables. Grains Breads that are made with saturated or trans fats, oils, or whole milk. Croissants. Sweet rolls. Donuts. High-fat crackers, such as cheese crackers. Meats and other proteins Fatty meats, such as hot dogs, ribs, sausage, bacon, rib-eye roast or steak. High-fat deli meats, such as salami and bologna. Caviar. Domestic duck and goose. Organ meats, such as liver. Dairy Cream, sour cream, cream cheese, and creamed cottage cheese. Whole-milk cheeses. Whole or 2% milk that is liquid, evaporated, or condensed. Whole buttermilk. Cream sauce or high-fat cheese sauce. Yogurt that is made from whole milk. Fats and oils Meat fat, or shortening. Cocoa butter, hydrogenated oils, palm oil, coconut oil, palm kernel oil. Solid fats and shortenings, including bacon fat, salt pork, lard, and butter. Nondairy cream substitutes. Salad dressings with cheese or sour cream. Beverages Regular sodas and juice drinks with added sugar. Sweets and desserts Frosting. Pudding. Cookies. Cakes. Pies. Milk chocolate or white chocolate. Buttered syrups. Full-fat ice cream or ice cream drinks. The items listed above may not be a complete list of foods and drinks to avoid. Contact a dietitian for more information. Summary Heart-healthy meal planning includes eating less unhealthy fats, eating more healthy fats, and making other changes in your diet. Eat a balanced diet. This includes fruits and vegetables, low-fat or nonfat dairy, lean protein, nuts and legumes, whole grains, and heart-healthy oils and fats. This information is not intended to replace advice given to you by your health care provider. Make sure you  discuss any questions you have with your health care provider. Document Revised: 06/15/2021 Document Reviewed: 06/15/2021 Elsevier Patient Education  2024 [employer].

## 2022-11-16 ENCOUNTER — Telehealth: Payer: Self-pay | Admitting: Internal Medicine

## 2022-11-16 DIAGNOSIS — I480 Paroxysmal atrial fibrillation: Secondary | ICD-10-CM | POA: Diagnosis not present

## 2022-11-16 DIAGNOSIS — K8012 Calculus of gallbladder with acute and chronic cholecystitis without obstruction: Secondary | ICD-10-CM | POA: Diagnosis not present

## 2022-11-16 DIAGNOSIS — C50511 Malignant neoplasm of lower-outer quadrant of right female breast: Secondary | ICD-10-CM | POA: Diagnosis not present

## 2022-11-16 DIAGNOSIS — J440 Chronic obstructive pulmonary disease with acute lower respiratory infection: Secondary | ICD-10-CM | POA: Diagnosis not present

## 2022-11-16 DIAGNOSIS — K869 Disease of pancreas, unspecified: Secondary | ICD-10-CM | POA: Diagnosis not present

## 2022-11-16 DIAGNOSIS — I5032 Chronic diastolic (congestive) heart failure: Secondary | ICD-10-CM | POA: Diagnosis not present

## 2022-11-16 NOTE — Telephone Encounter (Signed)
Caller [employer] What [employer]:  Marcelino Duster with [facility]   Phone Number:  [phone]   Needs Verbal orders for what service & frequency:  Home Health occupational therapy 1 time a week for 5 weeks

## 2022-11-17 ENCOUNTER — Ambulatory Visit: Payer: Self-pay

## 2022-11-17 ENCOUNTER — Ambulatory Visit: Payer: Medicare Other | Admitting: Neurology

## 2022-11-17 ENCOUNTER — Other Ambulatory Visit: Payer: Self-pay | Admitting: Internal Medicine

## 2022-11-17 NOTE — Telephone Encounter (Signed)
Ok for verbals 

## 2022-11-17 NOTE — Patient Outreach (Signed)
  Care Coordination   Initial Visit Note   11/17/2022 Name: Laura Mendez MRN: [id number] DOB: [birth year]  Laura Mendez is a 82 y.o. year old female who sees [name], MD for primary care. I spoke with Son, Al Espe(dpr) by phone today.  What matters to the patients health and wellness today?  Admit 10/19/22-10/27/22 with acute cholecystitis and 11/01/22-11/08/22 cholecystostomy tube dysfunction.  Per patient's son,  patient is sleeping. He reports Ms. Marschke is improving. She has seen PCP on 11/11/22 and cardiologist on  11/15/22. Al reports patient's drain is draining without difficulty. Home health agency making home visits. Patient has visit scheduled with surgeon on 11/19/22. No questions or concerns at this time.  Goals Addressed             This Visit's Progress    continue to improve post hospitalization       Interventions Today    Flowsheet Row Most Recent Value  Chronic Disease   Chronic disease during today's visit Atrial Fibrillation (AFib), Other  [recent cholecystitis with drain placement and subsequent drain malfunction requiring re-admission]  General Interventions   General Interventions Discussed/Reviewed General Interventions Discussed, Doctor Visits  Doctor Visits Discussed/Reviewed Doctor Visits Discussed, PCP, Specialist  PCP/Specialist Visits Compliance with follow-up visit  [reviewed upcoming/scheduled appointments]  Communication with PCP/Specialists  Education Interventions   Education Provided Provided Education  Provided Verbal Education On Other  [reviewed instrustions per PCP and cardiology office visit]  Nutrition Interventions   Nutrition Discussed/Reviewed Nutrition Discussed  Pharmacy Interventions   Pharmacy Dicussed/Reviewed Pharmacy Topics Discussed  Safety Interventions   Safety Discussed/Reviewed Safety Discussed, Fall Risk             SDOH assessments and interventions completed:  No completed recently no  changes.   Care Coordination Interventions:  Yes, provided   Follow up plan: Follow up call scheduled for 12/01/22    Encounter Outcome:  Pt. Visit Completed   Kathyrn Sheriff, RN, MSN, BSN, CCM [facility] Care Coordinator [phone]

## 2022-11-17 NOTE — Telephone Encounter (Signed)
Left a detailed message for the nurse.

## 2022-11-17 NOTE — Patient Instructions (Signed)
Visit Information  Thank you for taking time to visit with me today. Please don't hesitate to contact me if I can be of assistance to you.   Following are the goals we discussed today:  Continue to take medications as prescribed Continue to attend provider visits as scheduled Contact your provider with health questions or concerns as needed Continue to work with home health as recommended   Our next appointment is by telephone on 12/01/22 at 3:00 pm  Please call the care guide team at [phone] if you need to cancel or reschedule your appointment.   If you are experiencing a Mental Health or Behavioral Health Crisis or need someone to talk to, please call the Suicide and Crisis Lifeline: 93  Kathyrn Sheriff, RN, MSN, BSN, CCM [facility] Care Coordinator [phone]

## 2022-11-18 DIAGNOSIS — K869 Disease of pancreas, unspecified: Secondary | ICD-10-CM | POA: Diagnosis not present

## 2022-11-18 DIAGNOSIS — I480 Paroxysmal atrial fibrillation: Secondary | ICD-10-CM | POA: Diagnosis not present

## 2022-11-18 DIAGNOSIS — K8012 Calculus of gallbladder with acute and chronic cholecystitis without obstruction: Secondary | ICD-10-CM | POA: Diagnosis not present

## 2022-11-18 DIAGNOSIS — I5032 Chronic diastolic (congestive) heart failure: Secondary | ICD-10-CM | POA: Diagnosis not present

## 2022-11-18 DIAGNOSIS — J440 Chronic obstructive pulmonary disease with acute lower respiratory infection: Secondary | ICD-10-CM | POA: Diagnosis not present

## 2022-11-18 DIAGNOSIS — C50511 Malignant neoplasm of lower-outer quadrant of right female breast: Secondary | ICD-10-CM | POA: Diagnosis not present

## 2022-11-18 NOTE — Telephone Encounter (Signed)
MD has never refill. Pls advise on refill.Marland KitchenRaechel Chute

## 2022-11-19 DIAGNOSIS — I480 Paroxysmal atrial fibrillation: Secondary | ICD-10-CM | POA: Diagnosis not present

## 2022-11-19 DIAGNOSIS — J440 Chronic obstructive pulmonary disease with acute lower respiratory infection: Secondary | ICD-10-CM | POA: Diagnosis not present

## 2022-11-19 DIAGNOSIS — K819 Cholecystitis, unspecified: Secondary | ICD-10-CM | POA: Diagnosis not present

## 2022-11-19 DIAGNOSIS — K8012 Calculus of gallbladder with acute and chronic cholecystitis without obstruction: Secondary | ICD-10-CM | POA: Diagnosis not present

## 2022-11-19 DIAGNOSIS — I5032 Chronic diastolic (congestive) heart failure: Secondary | ICD-10-CM | POA: Diagnosis not present

## 2022-11-19 DIAGNOSIS — C50511 Malignant neoplasm of lower-outer quadrant of right female breast: Secondary | ICD-10-CM | POA: Diagnosis not present

## 2022-11-19 DIAGNOSIS — K869 Disease of pancreas, unspecified: Secondary | ICD-10-CM | POA: Diagnosis not present

## 2022-11-22 ENCOUNTER — Ambulatory Visit: Payer: Medicare Other | Admitting: Hematology and Oncology

## 2022-11-22 ENCOUNTER — Other Ambulatory Visit (HOSPITAL_COMMUNITY): Payer: Self-pay

## 2022-11-22 DIAGNOSIS — C50511 Malignant neoplasm of lower-outer quadrant of right female breast: Secondary | ICD-10-CM | POA: Diagnosis not present

## 2022-11-22 DIAGNOSIS — K869 Disease of pancreas, unspecified: Secondary | ICD-10-CM | POA: Diagnosis not present

## 2022-11-22 DIAGNOSIS — I480 Paroxysmal atrial fibrillation: Secondary | ICD-10-CM | POA: Diagnosis not present

## 2022-11-22 DIAGNOSIS — I5032 Chronic diastolic (congestive) heart failure: Secondary | ICD-10-CM | POA: Diagnosis not present

## 2022-11-22 DIAGNOSIS — J440 Chronic obstructive pulmonary disease with acute lower respiratory infection: Secondary | ICD-10-CM | POA: Diagnosis not present

## 2022-11-22 DIAGNOSIS — K8012 Calculus of gallbladder with acute and chronic cholecystitis without obstruction: Secondary | ICD-10-CM | POA: Diagnosis not present

## 2022-11-22 MED ORDER — PREVAGEN 10 MG PO CAPS
90.0000 | ORAL_CAPSULE | Freq: Every day | ORAL | 0 refills | Status: DC
  Filled 2022-11-22: qty 90, fill #0

## 2022-11-23 ENCOUNTER — Other Ambulatory Visit (HOSPITAL_COMMUNITY): Payer: Self-pay

## 2022-11-23 ENCOUNTER — Other Ambulatory Visit: Payer: Self-pay | Admitting: Surgery

## 2022-11-23 DIAGNOSIS — K810 Acute cholecystitis: Secondary | ICD-10-CM

## 2022-11-24 DIAGNOSIS — K8012 Calculus of gallbladder with acute and chronic cholecystitis without obstruction: Secondary | ICD-10-CM | POA: Diagnosis not present

## 2022-11-24 DIAGNOSIS — J440 Chronic obstructive pulmonary disease with acute lower respiratory infection: Secondary | ICD-10-CM | POA: Diagnosis not present

## 2022-11-24 DIAGNOSIS — I480 Paroxysmal atrial fibrillation: Secondary | ICD-10-CM | POA: Diagnosis not present

## 2022-11-24 DIAGNOSIS — C50511 Malignant neoplasm of lower-outer quadrant of right female breast: Secondary | ICD-10-CM | POA: Diagnosis not present

## 2022-11-24 DIAGNOSIS — I5032 Chronic diastolic (congestive) heart failure: Secondary | ICD-10-CM | POA: Diagnosis not present

## 2022-11-24 DIAGNOSIS — K869 Disease of pancreas, unspecified: Secondary | ICD-10-CM | POA: Diagnosis not present

## 2022-11-29 DIAGNOSIS — C50511 Malignant neoplasm of lower-outer quadrant of right female breast: Secondary | ICD-10-CM | POA: Diagnosis not present

## 2022-11-29 DIAGNOSIS — K869 Disease of pancreas, unspecified: Secondary | ICD-10-CM | POA: Diagnosis not present

## 2022-11-29 DIAGNOSIS — J440 Chronic obstructive pulmonary disease with acute lower respiratory infection: Secondary | ICD-10-CM | POA: Diagnosis not present

## 2022-11-29 DIAGNOSIS — K8012 Calculus of gallbladder with acute and chronic cholecystitis without obstruction: Secondary | ICD-10-CM | POA: Diagnosis not present

## 2022-11-29 DIAGNOSIS — I480 Paroxysmal atrial fibrillation: Secondary | ICD-10-CM | POA: Diagnosis not present

## 2022-11-29 DIAGNOSIS — I5032 Chronic diastolic (congestive) heart failure: Secondary | ICD-10-CM | POA: Diagnosis not present

## 2022-11-30 ENCOUNTER — Other Ambulatory Visit: Payer: Self-pay | Admitting: Internal Medicine

## 2022-11-30 DIAGNOSIS — J440 Chronic obstructive pulmonary disease with acute lower respiratory infection: Secondary | ICD-10-CM | POA: Diagnosis not present

## 2022-11-30 DIAGNOSIS — I480 Paroxysmal atrial fibrillation: Secondary | ICD-10-CM | POA: Diagnosis not present

## 2022-11-30 DIAGNOSIS — K8012 Calculus of gallbladder with acute and chronic cholecystitis without obstruction: Secondary | ICD-10-CM | POA: Diagnosis not present

## 2022-11-30 DIAGNOSIS — I5032 Chronic diastolic (congestive) heart failure: Secondary | ICD-10-CM | POA: Diagnosis not present

## 2022-11-30 DIAGNOSIS — K869 Disease of pancreas, unspecified: Secondary | ICD-10-CM | POA: Diagnosis not present

## 2022-11-30 DIAGNOSIS — C50511 Malignant neoplasm of lower-outer quadrant of right female breast: Secondary | ICD-10-CM | POA: Diagnosis not present

## 2022-12-01 ENCOUNTER — Ambulatory Visit: Payer: Self-pay

## 2022-12-01 ENCOUNTER — Other Ambulatory Visit (HOSPITAL_COMMUNITY): Payer: Self-pay

## 2022-12-01 DIAGNOSIS — I5032 Chronic diastolic (congestive) heart failure: Secondary | ICD-10-CM | POA: Diagnosis not present

## 2022-12-01 DIAGNOSIS — K869 Disease of pancreas, unspecified: Secondary | ICD-10-CM | POA: Diagnosis not present

## 2022-12-01 DIAGNOSIS — J440 Chronic obstructive pulmonary disease with acute lower respiratory infection: Secondary | ICD-10-CM | POA: Diagnosis not present

## 2022-12-01 DIAGNOSIS — K8012 Calculus of gallbladder with acute and chronic cholecystitis without obstruction: Secondary | ICD-10-CM | POA: Diagnosis not present

## 2022-12-01 DIAGNOSIS — I480 Paroxysmal atrial fibrillation: Secondary | ICD-10-CM | POA: Diagnosis not present

## 2022-12-01 DIAGNOSIS — C50511 Malignant neoplasm of lower-outer quadrant of right female breast: Secondary | ICD-10-CM | POA: Diagnosis not present

## 2022-12-01 NOTE — Patient Instructions (Signed)
Visit Information  Thank you for taking time to visit with me today. Please don't hesitate to contact me if I can be of assistance to you.   Following are the goals we discussed today:  Continue to take medications as prescribed Continue to attend provider visits as scheduled Contact your provider with health questions or concerns as needed  Our next appointment is by telephone on 12/28/22 at 3:00 pm  Please call the care guide team at [phone] if you need to cancel or reschedule your appointment.   If you are experiencing a Mental Health or Behavioral Health Crisis or need someone to talk to, please call the Suicide and Crisis Lifeline: 66  Kathyrn Sheriff, RN, MSN, BSN, CCM [facility] Care Coordinator [phone]

## 2022-12-01 NOTE — Patient Outreach (Signed)
  Care Coordination   Follow Up Visit Note   12/01/2022 Name: Shaquera Ansley MRN: [id number] DOB: [birth year]  Chris Narasimhan is a 82 y.o. year old female who sees [name], MD for primary care. I spoke with son/cpr Hiram Gash by phone today.  What matters to the patients health and wellness today?  Per Al, patient continues to attend provider visits. She has the drain that they continue to monitor. Patient has imaging of drain scheduled for tomorrow. He reports she continues to have some issues with memory although he reports her memory is improving. He denies any questions or concerns at this time. Patient continues to follow up with surgery as scheduled/recommended.  Goals Addressed             This Visit's Progress    continue to improve post hospitalization       Interventions Today    Flowsheet Row Most Recent Value  Chronic Disease   Chronic disease during today's visit Other  [recent cholecystitis with drain placement and subsequent drain malfunction requiring re-admission]  General Interventions   General Interventions Discussed/Reviewed General Interventions Reviewed  Doctor Visits Discussed/Reviewed Doctor Visits Reviewed  PCP/Specialist Visits Compliance with follow-up visit  [reviewed upoming appointments]  Education Interventions   Provided Verbal Education On Medication, Other  [encouraged to continue to take medications as prescribed, attend provider appointment as scheduled, monitor drain and contact provider if questions or concerns]  Nutrition Interventions   Nutrition Discussed/Reviewed Nutrition Reviewed  Pharmacy Interventions   Pharmacy Dicussed/Reviewed Pharmacy Topics Reviewed             SDOH assessments and interventions completed:  No  Care Coordination Interventions:  Yes, provided   Follow up plan: Follow up call scheduled for 12/28/2022    Encounter Outcome:  Pt. Visit Completed   Kathyrn Sheriff, RN, MSN, BSN,  CCM [facility] Care Coordinator [phone]

## 2022-12-02 ENCOUNTER — Ambulatory Visit (HOSPITAL_COMMUNITY)
Admission: RE | Admit: 2022-12-02 | Discharge: 2022-12-02 | Disposition: A | Discharge status: Home / Self Care | Payer: Medicare Other | Source: Ambulatory Visit | Attending: Radiology | Admitting: Radiology

## 2022-12-02 ENCOUNTER — Telehealth: Payer: Self-pay | Admitting: Student

## 2022-12-02 ENCOUNTER — Other Ambulatory Visit (HOSPITAL_COMMUNITY): Payer: Self-pay

## 2022-12-02 ENCOUNTER — Other Ambulatory Visit (HOSPITAL_COMMUNITY): Payer: Self-pay | Admitting: Interventional Radiology

## 2022-12-02 ENCOUNTER — Other Ambulatory Visit: Payer: Self-pay | Admitting: Radiology

## 2022-12-02 DIAGNOSIS — I480 Paroxysmal atrial fibrillation: Secondary | ICD-10-CM | POA: Diagnosis not present

## 2022-12-02 DIAGNOSIS — K810 Acute cholecystitis: Secondary | ICD-10-CM | POA: Diagnosis not present

## 2022-12-02 DIAGNOSIS — K8020 Calculus of gallbladder without cholecystitis without obstruction: Secondary | ICD-10-CM | POA: Diagnosis not present

## 2022-12-02 DIAGNOSIS — I5032 Chronic diastolic (congestive) heart failure: Secondary | ICD-10-CM | POA: Diagnosis not present

## 2022-12-02 DIAGNOSIS — C50511 Malignant neoplasm of lower-outer quadrant of right female breast: Secondary | ICD-10-CM | POA: Diagnosis not present

## 2022-12-02 DIAGNOSIS — K8012 Calculus of gallbladder with acute and chronic cholecystitis without obstruction: Secondary | ICD-10-CM | POA: Diagnosis not present

## 2022-12-02 DIAGNOSIS — J440 Chronic obstructive pulmonary disease with acute lower respiratory infection: Secondary | ICD-10-CM | POA: Diagnosis not present

## 2022-12-02 DIAGNOSIS — K869 Disease of pancreas, unspecified: Secondary | ICD-10-CM | POA: Diagnosis not present

## 2022-12-02 HISTORY — PX: IR SINUS/FIST TUBE CHK-NON GI: IMG673

## 2022-12-02 MED ORDER — IOHEXOL 300 MG/ML  SOLN
50.0000 mL | Freq: Once | INTRAMUSCULAR | Status: AC | PRN
  Administered 2022-12-02: 20 mL

## 2022-12-02 MED ORDER — NORMAL SALINE FLUSH 0.9 % IV SOLN
Freq: Every day | INTRAVENOUS | 0 refills | Status: DC
  Filled 2022-12-02: qty 400, 40d supply, fill #0

## 2022-12-02 NOTE — Progress Notes (Signed)
  Ms. Crumpler is an 82 year old with A-fib on Eliquis, history of stroke, CHF, and hyperlipidemia.  She presented to [facility] on 5/28 with weakness, nausea and vomiting, and  poor appetite.   She had leukocytosis to 21.6, mild acute kidney injury, completely benign abdominal exam.  CT showed intense inflammatory changes surrounding the gallbladder.   Given lack of pain or any other symptoms to suggest cholecystitis at that time, a trial of bowel rest and antibiotics was initiated.   She developed right upper quadrant tenderness and worsening leukocytosis.  She underwent percutaneous cholecystostomy placement on 10/21/22 by Dr. Lowella Dandy.   Her cholecystostomy tube was exchanged on 11/02/22 and it was upsized from 10 [place] to 12 [place].   She saw her cardiology team on 11/15/22 who documented that her perioperative risk of a major cardiac event is 11% according to [facility]CR[facility] and she is at high risk for perioperative complications, her functional capacity is poor.   She is not a good candidate for cholecystectomy.  She was seen today for cholangiogram to evaluate for patency of the cystic duct.  Images show the duct is patent with flow of contrast into the duodenum. The drain can be capped for a trial.  If symptoms recur, then place to gravity bag again.  She has an appointment on 12/13/22 with Dr. Elby Showers to discuss Spy Glass procedure.  Heather and I both demonstrated to the family (sons) today how to remove the cap and replace it with a gravity bag if she develops symptoms.  I also gave them written instructions and the clinic phone number/address for upcoming appointment.   Jerry Caras Lylla Eifler PA-C 12/02/2022 12:52 PM

## 2022-12-02 NOTE — Telephone Encounter (Signed)
Fax received from [facility] pharmacy for refill on saline flushes. Refill ordered. Patient was contacted and informed of refill.

## 2022-12-04 DIAGNOSIS — S21109A Unspecified open wound of unspecified front wall of thorax without penetration into thoracic cavity, initial encounter: Secondary | ICD-10-CM | POA: Diagnosis not present

## 2022-12-06 ENCOUNTER — Other Ambulatory Visit: Payer: Self-pay | Admitting: Internal Medicine

## 2022-12-06 ENCOUNTER — Other Ambulatory Visit: Payer: Self-pay | Admitting: Primary Care

## 2022-12-06 NOTE — Telephone Encounter (Signed)
Dr. Maple Hudson,  Patient requesting refill on prednisone.  You gave her a refill to take occasionally as needed for itching.  You last saw her on 07/29/2022. Do you want to refill this med?   Thank you.

## 2022-12-06 NOTE — Telephone Encounter (Signed)
Pt. Need med advise she calling about getting the prednisone she is having a  lot of itching

## 2022-12-06 NOTE — Telephone Encounter (Signed)
Prednisone sent to [facility]

## 2022-12-07 DIAGNOSIS — I5032 Chronic diastolic (congestive) heart failure: Secondary | ICD-10-CM | POA: Diagnosis not present

## 2022-12-07 DIAGNOSIS — I480 Paroxysmal atrial fibrillation: Secondary | ICD-10-CM | POA: Diagnosis not present

## 2022-12-07 DIAGNOSIS — K869 Disease of pancreas, unspecified: Secondary | ICD-10-CM | POA: Diagnosis not present

## 2022-12-07 DIAGNOSIS — C50511 Malignant neoplasm of lower-outer quadrant of right female breast: Secondary | ICD-10-CM | POA: Diagnosis not present

## 2022-12-07 DIAGNOSIS — K8012 Calculus of gallbladder with acute and chronic cholecystitis without obstruction: Secondary | ICD-10-CM | POA: Diagnosis not present

## 2022-12-07 DIAGNOSIS — J440 Chronic obstructive pulmonary disease with acute lower respiratory infection: Secondary | ICD-10-CM | POA: Diagnosis not present

## 2022-12-08 DIAGNOSIS — K8012 Calculus of gallbladder with acute and chronic cholecystitis without obstruction: Secondary | ICD-10-CM | POA: Diagnosis not present

## 2022-12-08 DIAGNOSIS — I5032 Chronic diastolic (congestive) heart failure: Secondary | ICD-10-CM | POA: Diagnosis not present

## 2022-12-08 DIAGNOSIS — K869 Disease of pancreas, unspecified: Secondary | ICD-10-CM | POA: Diagnosis not present

## 2022-12-08 DIAGNOSIS — C50511 Malignant neoplasm of lower-outer quadrant of right female breast: Secondary | ICD-10-CM | POA: Diagnosis not present

## 2022-12-08 DIAGNOSIS — J440 Chronic obstructive pulmonary disease with acute lower respiratory infection: Secondary | ICD-10-CM | POA: Diagnosis not present

## 2022-12-08 DIAGNOSIS — I480 Paroxysmal atrial fibrillation: Secondary | ICD-10-CM | POA: Diagnosis not present

## 2022-12-09 DIAGNOSIS — C50511 Malignant neoplasm of lower-outer quadrant of right female breast: Secondary | ICD-10-CM | POA: Diagnosis not present

## 2022-12-09 DIAGNOSIS — I480 Paroxysmal atrial fibrillation: Secondary | ICD-10-CM | POA: Diagnosis not present

## 2022-12-09 DIAGNOSIS — I5032 Chronic diastolic (congestive) heart failure: Secondary | ICD-10-CM | POA: Diagnosis not present

## 2022-12-09 DIAGNOSIS — K869 Disease of pancreas, unspecified: Secondary | ICD-10-CM | POA: Diagnosis not present

## 2022-12-09 DIAGNOSIS — K8012 Calculus of gallbladder with acute and chronic cholecystitis without obstruction: Secondary | ICD-10-CM | POA: Diagnosis not present

## 2022-12-09 DIAGNOSIS — J440 Chronic obstructive pulmonary disease with acute lower respiratory infection: Secondary | ICD-10-CM | POA: Diagnosis not present

## 2022-12-10 NOTE — Progress Notes (Signed)
Chief Complaint: Patient was seen in consultation today via virtual tele-health visit for gallstones; Spyglass procedure  Referring Physician(s): Connor,Chelsea A  History of Present Illness: Laura Mendez is an 82 y.o. female with a medical history significant for breast cancer, heart failure, atrial fibrillation (Eliquis) and CVA. She was admitted to the hospital May 2024 for nausea, vomiting and diarrhea. She was found to be in atrial fibrillation with RVR and imaging showed acute cholecystitis with cholelithiasis and a possible stone in the neck of the gallbladder.  Surgery was consulted but the patient was found to be a poor surgical candidate and conservative management was attempted. She developed RUQ tenderness with leukocytosis and IR was requested to place a percutaneous cholecystostomy. This was performed 10/21/22 and she was discharged from the hospital 10/27/22.   She returned to the ED 11/02/22 secondary to bleeding around the drain, likely due to Eliquis. IR was asked to evaluate the patient and on 11/02/22 her drain was upsized from a 10 [place] to a 12 [place] in order to tamponade the bleeding along the catheter tract. She was discharged home 11/08/22. She followed up with her Cardiology team as an outpatient for surgery clearance and was told she was at high perioperative risk for a major cardiac event. She next met with her surgeon, Dr. Fredricka Bonine on 11/19/22 and they discussed the next steps regarding further care for her gallbladder. Dr. Fredricka Bonine discussed waiting for IR follow up to determine patency of the cystic duct. Dr. Fredricka Bonine also discussed possible drain removal if the cystic duct was clear or a possible cholecystectomy if the duct was occluded. She also discussed a spyglass procedure as a non-surgical option and a referral to IR was submitted.   The patient followed up with IR 12/02/22 for a cholangiogram which showed the cystic duct to be patent but also showed numerous  gallstones within the gallbladder lumen. She presents today via virtual telephone visit to discuss treatment with a spyglass procedure for stone retrieval.  Past Medical History:  Diagnosis Date   Asthma    Breast cancer (HCC)    Chronic airway obstruction, not elsewhere classified    Diastolic dysfunction without heart failure    Disorder of bone and cartilage, unspecified    Dysrhythmia    A-Fib   Family history of colon cancer    Family history of kidney cancer    Family history of ovarian cancer    Paroxysmal atrial flutter (HCC)    Stroke (HCC)    TIA   TIA (transient ischemic attack)    Unspecified asthma(493.90)     Past Surgical History:  Procedure Laterality Date   ABDOMINAL HYSTERECTOMY     BREAST LUMPECTOMY Right 12/30/2017   BREAST LUMPECTOMY WITH RADIOACTIVE SEED LOCALIZATION Right 12/30/2017   Procedure: BREAST LUMPECTOMY WITH RADIOACTIVE SEED LOCALIZATION X'S 3;  Surgeon: Emelia Loron, MD;  Location: [facility] OR;  Service: General;  Laterality: Right;   BUNIONECTOMY Bilateral    EYE SURGERY Bilateral    cataract removal   IR EXCHANGE BILIARY DRAIN  11/02/2022   IR PERC CHOLECYSTOSTOMY  10/21/2022   IR SINUS/FIST TUBE CHK-NON GI  12/02/2022   LEFT HEART CATH AND CORONARY ANGIOGRAPHY N/A 11/23/2021   Procedure: LEFT HEART CATH AND CORONARY ANGIOGRAPHY;  Surgeon: Tonny Bollman, MD;  Location: [facility]IVE [facility] LAB;  Service: Cardiovascular;  Laterality: N/A;    Allergies: Clarithromycin, Aspirin, Ibuprofen, Codeine, Diltiazem, Methotrexate, Tape, Tapentadol, and Verapamil hcl er  Medications: Prior to Admission medications  Medication Sig Start Date End Date Taking? Authorizing Provider  albuterol (VENTOLIN HFA) 108 (90 Base) MCG/ACT inhaler INHALE 2 PUFFS BY MOUTH EVERY 4 HOURS AS NEEDED Patient taking differently: Inhale 2 puffs into the lungs every 4 (four) hours as needed for wheezing or shortness of breath. 10/06/22   Waymon Budge, MD  amiodarone (PACERONE)  200 MG tablet Take 2 tablets (400 mg total) by mouth 2 (two) times daily for 5 days, THEN 1 tablet (200 mg total) daily for 21 days. Stop amiodarone after taking 200mg  daily for 21 days.. 10/27/22 11/22/22  Burnadette Pop, MD  ammonium lactate (LAC-HYDRIN) 12 % cream Apply 1 Application topically as needed for dry skin. 04/28/22   Ellsworth Lennox, PA-C  apixaban (ELIQUIS) 5 MG TABS tablet Take 1 tablet by mouth twice daily 07/26/22   Lanier Prude, MD  DUPIXENT 300 MG/2ML SOPN Inject 300 mg into the skin every 14 (fourteen) days. 10/30/21   [provider]  fluticasone-salmeterol (ADVAIR) 250-50 MCG/ACT AEPB INHALE 1 PUFF INTO LUNGS TWICE DAILY 05/20/22   Jetty Duhamel D, MD  letrozole [facility]) 2.5 MG tablet Take 1 tablet (2.5 mg total) by mouth daily. 11/10/22   Serena Croissant, MD  Multiple Vitamin (MULTIVITAMIN) tablet Take 1 tablet by mouth daily.    [provider]  pantoprazole (PROTONIX) 40 MG tablet Take 1 tablet (40 mg total) by mouth daily. 10/29/22   [name], MD  predniSONE (DELTASONE) 10 MG tablet 1 daily or as directed 12/06/22   Jetty Duhamel D, MD  Sodium Chloride Flush (NORMAL SALINE FLUSH) 0.9 % SOLN Flush drain once daily with 5ml of saline 12/02/22   Kennieth Francois, PA  triamcinolone cream (KENALOG) 0.1 % Apply 1 Application topically 2 (two) times daily. 04/28/22   Ellsworth Lennox, PA-C     Family History  Problem Relation Age of Onset   COPD Father    Colon cancer Mother 37       mets to pancreas and liver   Kidney cancer Sister 54       d. 75   Lung cancer Sister        d. 1, smoker   Ovarian cancer Sister 11       d. 57   Cirrhosis Sister        d. 40   Melanoma Sister 80   Ovarian cancer Maternal Grandmother        d. 31   Heart disease Maternal Grandfather        rheumatic heart disease   Rectal cancer Paternal Grandfather        d. 70   Parkinson's disease Brother    Ovarian cancer Maternal Aunt    Colon cancer Maternal Uncle        d.  72-73   Heart attack Paternal Uncle        d. 53   Kidney cancer Brother 49   Colon cancer Maternal Uncle        d. 4   Cancer Other        MGMs pat 1/2 sister with uterine and rectal cancer   Colon cancer Cousin        d. 63 - mat first cousin   Breast cancer Neg Hx     Social History   Socioeconomic History   Marital status: Divorced    Spouse name: Not on file   Number of children: Not on file   Years of education: Not on file  Highest education level: Not on file  Occupational History   Occupation: retired  Tobacco Use   Smoking status: Former    Current packs/day: 0.00    Types: Cigarettes    Start date: 05/24/1977    Quit date: 05/24/1977    Years since quitting: 45.5   Smokeless tobacco: Never   Tobacco comments:    7/7 pt states she occasionally smoked, let cigs "burn" more than she smoked them  Vaping Use   Vaping status: Never Used  Substance and Sexual Activity   Alcohol use: No   Drug use: No   Sexual activity: Not on file  Other Topics Concern   Not on file  Social History Narrative   Right handed   1 coke per day   Lives alone   Social Determinants of Health   Financial Resource Strain: Patient Declined (11/09/2022)   Overall Financial Resource Strain (CARDIA)    Difficulty of Paying Living Expenses: Patient declined  Food Insecurity: No Food Insecurity (11/10/2022)   Hunger Vital Sign    Worried About Running Out of Food in the Last Year: Never true    Ran Out of Food in the Last Year: Never true  Transportation Needs: No Transportation Needs (11/10/2022)   PRAPARE - Administrator, Civil Service (Medical): No    Lack of Transportation (Non-Medical): No  Physical Activity: Unknown (11/09/2022)   Exercise Vital Sign    Days of Exercise per Week: 0 days    Minutes of Exercise per Session: Not on file  Recent Concern: Physical Activity - Inactive (11/09/2022)   Exercise Vital Sign    Days of Exercise per Week: 0 days    Minutes of  Exercise per Session: 30 min  Stress: No Stress Concern Present (11/09/2022)   [employer] of Occupational Health - Occupational Stress Questionnaire    Feeling of Stress : Only a little  Social Connections: Unknown (11/09/2022)   Social Connection and Isolation Panel [NHANES]    Frequency of Communication with Friends and Family: Patient declined    Frequency of Social Gatherings with Friends and Family: Patient declined    Attends Religious Services: Patient declined    Database administrator or Organizations: No    Attends Engineer, structural: Not on file    Marital Status: Patient declined    Review of Systems: A 12 point ROS discussed and pertinent positives are indicated in the HPI above.  All other systems are negative.  Vital Signs: There were no vitals taken for this visit.  No physical exam performed in lieu of virtual telephone visit.   Advance Care Plan: The advanced care plan/surrogate decision maker was discussed at the time of visit and the patient did not wish to discuss or was not able to name a surrogate decision maker or provide an advance care plan.    Imaging: IR Sinus/Fist Tube Chk-Non GI  Result Date: 12/02/2022 INDICATION: 82 year old woman underwent cholecystostomy drain placement on 10/21/2022 for acute calculus cholecystitis. She returns today for drain evaluation. Drain was last exchanged on 11/02/2022. EXAM: Fluoroscopic cholangiogram through existing cholecystostomy drain MEDICATIONS: None ANESTHESIA/SEDATION: None FLUOROSCOPY TIME:  Radiation Exposure Index (as provided by the fluoroscopic device): 48 mGy Kerma COMPLICATIONS: None immediate. PROCEDURE: Informed written consent was obtained from the patient after a thorough discussion of the procedural risks, benefits and alternatives. All questions were addressed. Maximal Sterile Barrier Technique was utilized including caps, mask, sterile gowns, sterile gloves, sterile drape, hand hygiene and  skin antiseptic. A timeout was performed prior to the initiation of the procedure. Patient positioned supine on the procedure table. Scout image demonstrated the existing 12 [place] cholecystostomy drain in appropriate position. Contrast administered through the drain under fluoroscopy showed numerous gallstones within the gallbladder lumen. Cystic and common bile ducts were patent. IMPRESSION: Cholangiogram demonstrates patent cystic and common bile duct. PLAN: 1. Drain was capped. Instructions to reattached to bag were provided to patient and family in case of worsening pain, fever, chills, increasing drainage around entry site, nausea, or vomiting. 2. Patient is scheduled to see Dr. Elby Showers on 12/13/2022 for possible Spyglass procedure. Electronically Signed   By: Acquanetta Belling M.D.   On: 12/02/2022 13:40    Labs:  CBC: Recent Labs    11/05/22 0011 11/06/22 0139 11/07/22 0333 11/08/22 0014  WBC 7.4 6.6 7.0 7.5  HGB 11.0* 11.8* 11.9* 11.9*  HCT 36.5 37.2 38.2 38.9  PLT 304 311 290 281    COAGS: Recent Labs    10/19/22 1123 10/20/22 0225 10/20/22 2145 10/21/22 0207 10/21/22 0640 10/22/22 0207 11/01/22 2222  INR 2.0*  --   --   --   --   --  1.5*  APTT  --    < > 89* 157* 70* 69*  --    < > = values in this interval not displayed.    BMP: Recent Labs    11/04/22 0003 11/05/22 0011 11/06/22 0139 11/07/22 0333  NA 139 140 142 137  K 3.9 4.2 3.7 3.8  CL 104 105 101 103  CO2 25 22 27 26   GLUCOSE 101* 87 108* 99  BUN 17 14 10 10   CALCIUM 8.7* 9.2 9.2 8.8*  CREATININE 1.04* 1.18* 0.83 0.90  GFRNONAA 54* 46* >60 >60    LIVER FUNCTION TESTS: Recent Labs    11/04/22 0003 11/05/22 0011 11/06/22 0139 11/07/22 0333  BILITOT 0.7 0.5 0.5 0.8  AST 93* 40 22 20  ALT 96* 64* 47* 34  ALKPHOS 153* 138* 116 100  PROT 6.0* 5.8* 5.9* 5.9*  ALBUMIN 2.9* 2.8* 2.9* 2.9*    TUMOR MARKERS: No results for input(s): "AFPTM", "CEA", "CA199", "CHROMGRNA" in the last 8760  hours.  Assessment and Plan:  Laura Mendez is an 82 y.o. female with a medical history significant for acute cholecystitis secondary to gallstones.   Thank you for this interesting consult.  I greatly enjoyed meeting Laura Mendez and look forward to participating in their care.  A copy of this report was sent to the requesting provider on this date.  Electronically Signed: Mickie Kay, NP 12/10/2022, 1:57 PM   I spent a total of  40 Minutes  virtual clinical consultation, greater than 50% of which was counseling/coordinating care for gallstones; Spyglass procedure

## 2022-12-13 ENCOUNTER — Ambulatory Visit
Admission: RE | Admit: 2022-12-13 | Discharge: 2022-12-13 | Disposition: A | Discharge status: Home / Self Care | Payer: Medicare Other | Source: Ambulatory Visit | Attending: Surgery | Admitting: Surgery

## 2022-12-13 DIAGNOSIS — K810 Acute cholecystitis: Secondary | ICD-10-CM

## 2022-12-13 HISTORY — PX: IR RADIOLOGIST EVAL & MGMT: IMG5224

## 2022-12-14 ENCOUNTER — Other Ambulatory Visit (HOSPITAL_COMMUNITY): Payer: Self-pay | Admitting: Interventional Radiology

## 2022-12-14 ENCOUNTER — Telehealth: Payer: Self-pay | Admitting: *Deleted

## 2022-12-14 ENCOUNTER — Telehealth (HOSPITAL_COMMUNITY): Payer: Self-pay

## 2022-12-14 DIAGNOSIS — K8000 Calculus of gallbladder with acute cholecystitis without obstruction: Secondary | ICD-10-CM

## 2022-12-14 NOTE — Telephone Encounter (Signed)
Son called to schedule spyglass procedure. I am waiting to hear back from schedulers about when Dr. Elby Showers can be moved to [facility] We are working on the week of 8/5. I will call back to schedule when we have a date. Pt's son agreed with this plan. AB

## 2022-12-14 NOTE — Telephone Encounter (Signed)
   Patient Name: Laura Mendez  DOB: 26-Apr-1941 MRN: [id number]  Primary Cardiologist: Donato Schultz, MD  Chart reviewed as part of pre-operative protocol coverage. Given past medical history and time since last visit, based on ACC/AHA guidelines, Laura Mendez is at acceptable risk for the planned procedure without further cardiovascular testing. Pt was cleared for surgery by Jari Favre, PA at OV on 11/15/2022.  Per office protocol, patient can hold Eliquis for 2 days prior to procedure. Resume as soon as safely possible after given elevated CV risk.    I will route this recommendation, as well as the note from pt's most recent OV to the requesting party via Epic fax function and remove from pre-op pool.  Please call with questions.  Joylene Grapes, NP 12/14/2022, 4:17 PM

## 2022-12-14 NOTE — Telephone Encounter (Signed)
   Pre-operative Risk Assessment    Patient Name: Laura Mendez  DOB: 1940/11/14 MRN: [id number]      Request for Surgical Clearance    Procedure:   IR GUIDED GALLSTONE RETRIEVAL [facility])  Date of Surgery:  Clearance 12/29/22                                 Surgeon:  DR. Marliss Coots  Surgeon's Group or Practice Name:  [facility] RADIOLOGY DEPT  Phone number:  [phone] Fax number:  [phone]   Type of Clearance Requested:   - Medical  - Pharmacy:  Hold Apixaban (Eliquis) x 4 DOSES PRIOR    Type of Anesthesia:   MODERATE SEDATION   Additional requests/questions:    Elpidio Anis   12/14/2022, 2:13 PM

## 2022-12-14 NOTE — Telephone Encounter (Signed)
Patient with diagnosis of afib on Eliquis for anticoagulation.    Procedure: IR guided gallstone retrieval Date of procedure: 12/29/22  CHA2DS2-VASc Score = 7  This indicates a 11.2% annual risk of stroke. The patient's score is based upon: CHF History: 1 HTN History: 0 Diabetes History: 0 Stroke History: 2 Vascular Disease History: 1 Age Score: 2 Gender Score: 1   CVA Feb 2023.  CrCl 49mL/min Platelet count 281K  Per office protocol, patient can hold Eliquis for 2 days prior to procedure. Resume as soon as safely possible after given elevated CV risk.   **This guidance is not considered finalized until pre-operative APP has relayed final recommendations.**

## 2022-12-15 DIAGNOSIS — I5032 Chronic diastolic (congestive) heart failure: Secondary | ICD-10-CM | POA: Diagnosis not present

## 2022-12-15 DIAGNOSIS — K8012 Calculus of gallbladder with acute and chronic cholecystitis without obstruction: Secondary | ICD-10-CM | POA: Diagnosis not present

## 2022-12-15 DIAGNOSIS — J440 Chronic obstructive pulmonary disease with acute lower respiratory infection: Secondary | ICD-10-CM | POA: Diagnosis not present

## 2022-12-15 DIAGNOSIS — C50511 Malignant neoplasm of lower-outer quadrant of right female breast: Secondary | ICD-10-CM | POA: Diagnosis not present

## 2022-12-15 DIAGNOSIS — I480 Paroxysmal atrial fibrillation: Secondary | ICD-10-CM | POA: Diagnosis not present

## 2022-12-15 DIAGNOSIS — K869 Disease of pancreas, unspecified: Secondary | ICD-10-CM | POA: Diagnosis not present

## 2022-12-16 ENCOUNTER — Telehealth: Payer: Self-pay | Admitting: Hematology and Oncology

## 2022-12-16 DIAGNOSIS — I5032 Chronic diastolic (congestive) heart failure: Secondary | ICD-10-CM | POA: Diagnosis not present

## 2022-12-16 DIAGNOSIS — C50511 Malignant neoplasm of lower-outer quadrant of right female breast: Secondary | ICD-10-CM | POA: Diagnosis not present

## 2022-12-16 DIAGNOSIS — K8012 Calculus of gallbladder with acute and chronic cholecystitis without obstruction: Secondary | ICD-10-CM | POA: Diagnosis not present

## 2022-12-16 DIAGNOSIS — K869 Disease of pancreas, unspecified: Secondary | ICD-10-CM | POA: Diagnosis not present

## 2022-12-16 DIAGNOSIS — I480 Paroxysmal atrial fibrillation: Secondary | ICD-10-CM | POA: Diagnosis not present

## 2022-12-16 DIAGNOSIS — J440 Chronic obstructive pulmonary disease with acute lower respiratory infection: Secondary | ICD-10-CM | POA: Diagnosis not present

## 2022-12-16 NOTE — Telephone Encounter (Signed)
Rescheduled appointment per patients request. Talked with the patients son and he is aware of the changes made to the patients upcoming appointment.

## 2022-12-22 DIAGNOSIS — I480 Paroxysmal atrial fibrillation: Secondary | ICD-10-CM | POA: Diagnosis not present

## 2022-12-22 DIAGNOSIS — K869 Disease of pancreas, unspecified: Secondary | ICD-10-CM | POA: Diagnosis not present

## 2022-12-22 DIAGNOSIS — J440 Chronic obstructive pulmonary disease with acute lower respiratory infection: Secondary | ICD-10-CM | POA: Diagnosis not present

## 2022-12-22 DIAGNOSIS — I5032 Chronic diastolic (congestive) heart failure: Secondary | ICD-10-CM | POA: Diagnosis not present

## 2022-12-22 DIAGNOSIS — C50511 Malignant neoplasm of lower-outer quadrant of right female breast: Secondary | ICD-10-CM | POA: Diagnosis not present

## 2022-12-22 DIAGNOSIS — K8012 Calculus of gallbladder with acute and chronic cholecystitis without obstruction: Secondary | ICD-10-CM | POA: Diagnosis not present

## 2022-12-23 DIAGNOSIS — C50511 Malignant neoplasm of lower-outer quadrant of right female breast: Secondary | ICD-10-CM | POA: Diagnosis not present

## 2022-12-23 DIAGNOSIS — I480 Paroxysmal atrial fibrillation: Secondary | ICD-10-CM | POA: Diagnosis not present

## 2022-12-23 DIAGNOSIS — K869 Disease of pancreas, unspecified: Secondary | ICD-10-CM | POA: Diagnosis not present

## 2022-12-23 DIAGNOSIS — J440 Chronic obstructive pulmonary disease with acute lower respiratory infection: Secondary | ICD-10-CM | POA: Diagnosis not present

## 2022-12-23 DIAGNOSIS — K8012 Calculus of gallbladder with acute and chronic cholecystitis without obstruction: Secondary | ICD-10-CM | POA: Diagnosis not present

## 2022-12-23 DIAGNOSIS — I5032 Chronic diastolic (congestive) heart failure: Secondary | ICD-10-CM | POA: Diagnosis not present

## 2022-12-27 ENCOUNTER — Ambulatory Visit: Payer: Medicare Other | Admitting: Hematology and Oncology

## 2022-12-28 ENCOUNTER — Ambulatory Visit: Payer: Self-pay

## 2022-12-28 ENCOUNTER — Other Ambulatory Visit: Payer: Self-pay | Admitting: Student

## 2022-12-28 DIAGNOSIS — K810 Acute cholecystitis: Secondary | ICD-10-CM

## 2022-12-28 NOTE — Patient Instructions (Signed)
Visit Information  Thank you for taking time to visit with me today. Please don't hesitate to contact me if I can be of assistance to you.   Following are the goals we discussed today:   Goals Addressed             This Visit's Progress    continue to improve post hospitalization       Interventions Today    Flowsheet Row Most Recent Value  Chronic Disease   Chronic disease during today's visit Other  General Interventions   General Interventions Discussed/Reviewed General Interventions Reviewed, Doctor Visits  Doctor Visits Discussed/Reviewed Doctor Visits Discussed, Doctor Visits Reviewed  Education Interventions   Education Provided Provided Education  Provided Verbal Education On Other  [advised to continue to take medications as scheduled, attend provider visits as scheduled]             Our next appointment is by telephone on 01/14/23 at 3:00 pm  Please call the care guide team at [phone] if you need to cancel or reschedule your appointment.   If you are experiencing a Mental Health or Behavioral Health Crisis or need someone to talk to, please call the Suicide and Crisis Lifeline: 988 call the [place] National Suicide Prevention Lifeline: (616) 663-8466 or TTY: 857 862 2694 TTY 228-580-8043) to talk to a trained counselor call 1-800-273-TALK (toll free, 24 hour hotline)  Kathyrn Sheriff, RN, MSN, BSN, CCM [facility] Care Coordinator [phone]

## 2022-12-28 NOTE — Patient Outreach (Signed)
  Care Coordination   Follow Up Visit Note   12/28/2022 Name: Laura Mendez MRN: [id number] DOB: 1940/07/26  Laura Mendez is a 82 y.o. year old female who sees Laura Broker, MD for primary care. I spoke with patient's son Laura Mendez(dpr) by phone today.  What matters to the patients health and wellness today?  Per son Laura Mendez is doing well. She still has the drain that is now capped. He reports she is having a procedure to remove the gallstones tomorrow. She continues to to have home health.  Goals Addressed             This Visit's Progress    continue to improve post hospitalization       Interventions Today    Flowsheet Row Most Recent Value  Chronic Disease   Chronic disease during today's visit Other  General Interventions   General Interventions Discussed/Reviewed General Interventions Reviewed, Doctor Visits  Doctor Visits Discussed/Reviewed Doctor Visits Discussed, Doctor Visits Reviewed  Education Interventions   Education Provided Provided Education  Provided Verbal Education On Other  [advised to continue to take medications as scheduled, attend provider visits as scheduled]             SDOH assessments and interventions completed:  No  Care Coordination Interventions:  Yes, provided   Follow up plan: Follow up call scheduled for 01/14/23    Encounter Outcome:  Pt. Visit Completed   Laura Sheriff, RN, MSN, BSN, CCM Laura Mendez Care Coordinator [phone]

## 2022-12-29 ENCOUNTER — Other Ambulatory Visit (HOSPITAL_COMMUNITY): Payer: Self-pay | Admitting: Interventional Radiology

## 2022-12-29 ENCOUNTER — Other Ambulatory Visit: Payer: Self-pay

## 2022-12-29 ENCOUNTER — Ambulatory Visit (HOSPITAL_COMMUNITY): Admission: RE | Admit: 2022-12-29 | Payer: Medicare Other | Source: Ambulatory Visit

## 2022-12-29 ENCOUNTER — Encounter (HOSPITAL_COMMUNITY): Payer: Self-pay

## 2022-12-29 DIAGNOSIS — K8000 Calculus of gallbladder with acute cholecystitis without obstruction: Secondary | ICD-10-CM

## 2022-12-29 DIAGNOSIS — Z8673 Personal history of transient ischemic attack (TIA), and cerebral infarction without residual deficits: Secondary | ICD-10-CM | POA: Diagnosis not present

## 2022-12-29 DIAGNOSIS — Z434 Encounter for attention to other artificial openings of digestive tract: Secondary | ICD-10-CM | POA: Insufficient documentation

## 2022-12-29 DIAGNOSIS — K810 Acute cholecystitis: Secondary | ICD-10-CM

## 2022-12-29 HISTORY — PX: IR EXCHANGE BILIARY DRAIN: IMG6046

## 2022-12-29 HISTORY — PX: IR REMOVAL OF CALCULI/DEBRIS BILIARY DUCT/GB: IMG6054

## 2022-12-29 LAB — CBC
HCT: 44.7 % (ref 36.0–46.0)
Hemoglobin: 13.9 g/dL (ref 12.0–15.0)
MCH: 28.1 pg (ref 26.0–34.0)
MCHC: 31.1 g/dL (ref 30.0–36.0)
MCV: 90.5 fL (ref 80.0–100.0)
Platelets: 300 10*3/uL (ref 150–400)
RBC: 4.94 MIL/uL (ref 3.87–5.11)
RDW: 15 % (ref 11.5–15.5)
WBC: 7.6 10*3/uL (ref 4.0–10.5)
nRBC: 0 % (ref 0.0–0.2)

## 2022-12-29 LAB — PROTIME-INR
INR: 1 (ref 0.8–1.2)
Prothrombin Time: 13.1 seconds (ref 11.4–15.2)

## 2022-12-29 MED ORDER — SODIUM CHLORIDE 0.9 % IV SOLN
INTRAVENOUS | Status: AC
  Filled 2022-12-29: qty 20

## 2022-12-29 MED ORDER — LIDOCAINE-EPINEPHRINE 1 %-1:100000 IJ SOLN
INTRAMUSCULAR | Status: AC
  Filled 2022-12-29: qty 1

## 2022-12-29 MED ORDER — SODIUM CHLORIDE 0.9 % IV SOLN
8.0000 mg | Freq: Once | INTRAVENOUS | Status: DC
  Filled 2022-12-29: qty 4

## 2022-12-29 MED ORDER — SODIUM CHLORIDE 0.9 % IV SOLN: INTRAVENOUS | Status: DC

## 2022-12-29 MED ORDER — SODIUM CHLORIDE 0.9 % IV SOLN
2.0000 g | Freq: Once | INTRAVENOUS | Status: AC
  Administered 2022-12-29: 2 g via INTRAVENOUS

## 2022-12-29 MED ORDER — SODIUM CHLORIDE 0.9% FLUSH: 5.0000 mL | Freq: Three times a day (TID) | INTRAVENOUS | Status: DC

## 2022-12-29 MED ORDER — FENTANYL CITRATE (PF) 100 MCG/2ML IJ SOLN
INTRAMUSCULAR | Status: AC | PRN
  Administered 2022-12-29 (×10): 25 ug via INTRAVENOUS

## 2022-12-29 MED ORDER — MIDAZOLAM HCL 2 MG/2ML IJ SOLN
INTRAMUSCULAR | Status: AC | PRN
Start: 2022-12-29 — End: 2022-12-29
  Administered 2022-12-29 (×6): .5 mg via INTRAVENOUS
  Administered 2022-12-29: 1 mg via INTRAVENOUS
  Administered 2022-12-29 (×2): .5 mg via INTRAVENOUS

## 2022-12-29 MED ORDER — FENTANYL CITRATE (PF) 100 MCG/2ML IJ SOLN
INTRAMUSCULAR | Status: AC
  Filled 2022-12-29: qty 2

## 2022-12-29 MED ORDER — ONDANSETRON HCL 4 MG/2ML IJ SOLN
INTRAMUSCULAR | Status: AC
  Administered 2022-12-29: 4 mg
  Filled 2022-12-29: qty 2

## 2022-12-29 MED ORDER — MIDAZOLAM HCL 2 MG/2ML IJ SOLN
INTRAMUSCULAR | Status: AC
  Filled 2022-12-29: qty 2

## 2022-12-29 MED ORDER — IOHEXOL 300 MG/ML  SOLN: 150.0000 mL | Freq: Once | INTRAMUSCULAR | Status: DC | PRN

## 2022-12-29 MED ORDER — ONDANSETRON HCL 4 MG/2ML IJ SOLN: 4.0000 mg | Freq: Once | INTRAMUSCULAR | Status: AC

## 2022-12-29 NOTE — Progress Notes (Signed)
Supervising Physician: Marliss Coots  Patient Status:  [facility] OP  Chief Complaint:  Cholelithiasis Percutaneous cholecystostomy drain placed in IR 10/21/22  Subjective:  Scheduled for IR procedure today with Dr Elby Showers  Procedure:  1) Percutaneous cholecystogram 2) Cholangioscopy 3) Percutaneous gallstone retrieval 4) Cholecystostomy drain exchange   Findings: Please refer to procedural dictation for full description.  Percutaneous gallstone retrieval yielding large volume of small gallstones.  Approximately 60% of gallstones removed.  Replaced cholecystostomy drain, 14 Fr, to bag drainage.    I was asked to evaluate pt post procedure Pt feeling cold and "shivering"   Allergies: Clarithromycin, Aspirin, Ibuprofen, Codeine, Diltiazem, Methotrexate, Tape, Tapentadol, and Verapamil hcl er  Medications: Prior to Admission medications   Medication Sig Start Date End Date Taking? Authorizing Provider  albuterol (VENTOLIN HFA) 108 (90 Base) MCG/ACT inhaler INHALE 2 PUFFS BY MOUTH EVERY 4 HOURS AS NEEDED Patient taking differently: Inhale 2 puffs into the lungs every 4 (four) hours as needed for wheezing or shortness of breath. 10/06/22  Yes Jetty Duhamel D, MD  fluticasone-salmeterol (ADVAIR) 250-50 MCG/ACT AEPB INHALE 1 PUFF INTO LUNGS TWICE DAILY 05/20/22  Yes Jetty Duhamel D, MD  letrozole [facility]) 2.5 MG tablet Take 1 tablet (2.5 mg total) by mouth daily. 11/10/22  Yes Serena Croissant, MD  Multiple Vitamin (MULTIVITAMIN) tablet Take 1 tablet by mouth daily.   Yes [provider]  pantoprazole (PROTONIX) 40 MG tablet Take 1 tablet (40 mg total) by mouth daily. 10/29/22  Yes [name], MD  predniSONE (DELTASONE) 10 MG tablet 1 daily or as directed 12/06/22  Yes Young, Joni Fears D, MD  amiodarone (PACERONE) 200 MG tablet Take 2 tablets (400 mg total) by mouth 2 (two) times daily for 5 days, THEN 1 tablet (200 mg total) daily for 21 days. Stop amiodarone after taking  200mg  daily for 21 days.. 10/27/22 11/22/22  Burnadette Pop, MD  ammonium lactate (LAC-HYDRIN) 12 % cream Apply 1 Application topically as needed for dry skin. 04/28/22   Ellsworth Lennox, PA-C  apixaban (ELIQUIS) 5 MG TABS tablet Take 1 tablet by mouth twice daily 07/26/22   Lanier Prude, MD  DUPIXENT 300 MG/2ML SOPN Inject 300 mg into the skin every 14 (fourteen) days. 10/30/21   [provider]  Sodium Chloride Flush (NORMAL SALINE FLUSH) 0.9 % SOLN Flush drain once daily with 5ml of saline 12/02/22   Kennieth Francois, PA  triamcinolone cream (KENALOG) 0.1 % Apply 1 Application topically 2 (two) times daily. 04/28/22   Ellsworth Lennox, PA-C     Vital Signs: BP 118/65   Pulse 69   Temp 97.9 F (36.6 C) (Temporal)   Resp 13   Ht 5\' 10"  (1.778 m)   Wt 198 lb (89.8 kg)   SpO2 91%   BMI 28.41 kg/m   Physical Exam Vitals reviewed.  Constitutional:      Comments: Pt is resting comfortably No shivers No rigors   Skin:    General: Skin is warm.     Comments: Site of drain is sl tender at immediate site No bleeding Clean and dry No hematoma  OP is bloody   Neurological:     Mental Status: She is alert and oriented to person, place, and time.  Psychiatric:        Behavior: Behavior normal.     Imaging: No results found.  Labs:  CBC: Recent Labs    11/06/22 0139 11/07/22 0333 11/08/22 0014 12/29/22 0925  WBC 6.6 7.0  7.5 7.6  HGB 11.8* 11.9* 11.9* 13.9  HCT 37.2 38.2 38.9 44.7  PLT 311 290 281 300    COAGS: Recent Labs    10/19/22 1123 10/20/22 0225 10/20/22 2145 10/21/22 0207 10/21/22 0640 10/22/22 0207 11/01/22 2222 12/29/22 1015  INR 2.0*  --   --   --   --   --  1.5* 1.0  APTT  --    < > 89* 157* 70* 69*  --   --    < > = values in this interval not displayed.    BMP: Recent Labs    11/04/22 0003 11/05/22 0011 11/06/22 0139 11/07/22 0333  NA 139 140 142 137  K 3.9 4.2 3.7 3.8  CL 104 105 101 103  CO2 25 22 27 26   GLUCOSE 101* 87 108* 99   BUN 17 14 10 10   CALCIUM 8.7* 9.2 9.2 8.8*  CREATININE 1.04* 1.18* 0.83 0.90  GFRNONAA 54* 46* >60 >60    LIVER FUNCTION TESTS: Recent Labs    11/04/22 0003 11/05/22 0011 11/06/22 0139 11/07/22 0333  BILITOT 0.7 0.5 0.5 0.8  AST 93* 40 22 20  ALT 96* 64* 47* 34  ALKPHOS 153* 138* 116 100  PROT 6.0* 5.8* 5.9* 5.9*  ALBUMIN 2.9* 2.8* 2.9* 2.9*    Assessment and Plan:  Post procedure: Pt is dong well No rigors Pt has "warmed up " nicely with a few blankets Comfortable; no pain Afebrile Site is c/d/I Discussed with Dr Elby Showers May DC home as planned   Electronically Signed: Robet Leu, PA-C 12/29/2022, 2:45 PM   I spent a total of 15 Minutes at the the patient's bedside AND on the patient's hospital floor or unit, greater than 50% of which was counseling/coordinating care for post Spyglass procedure

## 2022-12-29 NOTE — Procedures (Signed)
Interventional Radiology Procedure Note  Procedure:  1) Percutaneous cholecystogram 2) Cholangioscopy 3) Percutaneous gallstone retrieval 4) Cholecystostomy drain exchange  Findings: Please refer to procedural dictation for full description.  Percutaneous gallstone retrieval yielding large volume of small gallstones.  Approximately 60% of gallstones removed.  Replaced cholecystostomy drain, 14 Fr, to bag drainage.  Complications: None immediate  Estimated Blood Loss: < 5 mL  Recommendations: Keep drain to bag drainage. Plan for 1 month follow up percutaneous gallstone retrieval.   Marliss Coots, MD

## 2022-12-29 NOTE — H&P (Signed)
Chief Complaint: Patient was seen in consultation today for Biliary drain evaluation/exchange/upsize with probable Spyglass stone retrieval procedure at the request of Dr Milford Cage   Supervising Physician: Marliss Coots  Patient Status: [name] - Out-pt  History of Present Illness: Laura Mendez is a 82 y.o. female   FULL Code status per pt Hx percutaneous cholecystostomy drain placement in IR 10/21/22 Noted cholelithiasis and not good surgical candidate Exchange 11/02/22 The patient followed up with IR 12/02/22 for a cholangiogram which showed the cystic duct to be patent but also showed numerous gallstones within the gallbladder lumen   Was seen in consultation 12/13/22 with Dr Elby Showers  Acute calculous cholecystitis status post percutaneous cholecystostomy on 10/21/22 who is a poor surgical candidate for multiple comorbidities including history of stroke and heart failure.  We discussed choledochoscopic guided percutaneous gallstone retrieval as an option to hopefully eventually be able to remove the cholecystotomy tube safely.  The patient and family are agreeable and wish to proceed.   Plan for choledochoscopic-assisted (Spyglass) percutaneous gallstone retrieval at [facility] with moderate sedation.  Scheduled for procedure in IR today  Past Medical History:  Diagnosis Date   Asthma    Breast cancer (HCC)    Chronic airway obstruction, not elsewhere classified    Diastolic dysfunction without heart failure    Disorder of bone and cartilage, unspecified    Dysrhythmia    A-Fib   Family history of colon cancer    Family history of kidney cancer    Family history of ovarian cancer    Paroxysmal atrial flutter (HCC)    Stroke (HCC)    TIA   TIA (transient ischemic attack)    Unspecified asthma(493.90)     Past Surgical History:  Procedure Laterality Date   ABDOMINAL HYSTERECTOMY     BREAST LUMPECTOMY Right 12/30/2017   BREAST LUMPECTOMY WITH RADIOACTIVE  SEED LOCALIZATION Right 12/30/2017   Procedure: BREAST LUMPECTOMY WITH RADIOACTIVE SEED LOCALIZATION X'S 3;  Surgeon: Emelia Loron, MD;  Location: [facility] OR;  Service: General;  Laterality: Right;   BUNIONECTOMY Bilateral    EYE SURGERY Bilateral    cataract removal   IR EXCHANGE BILIARY DRAIN  11/02/2022   IR PERC CHOLECYSTOSTOMY  10/21/2022   IR RADIOLOGIST EVAL & MGMT  12/13/2022   IR SINUS/FIST TUBE CHK-NON GI  12/02/2022   LEFT HEART CATH AND CORONARY ANGIOGRAPHY N/A 11/23/2021   Procedure: LEFT HEART CATH AND CORONARY ANGIOGRAPHY;  Surgeon: Tonny Bollman, MD;  Location: [facility] INVASIVE CV LAB;  Service: Cardiovascular;  Laterality: N/A;    Allergies: Clarithromycin, Aspirin, Ibuprofen, Codeine, Diltiazem, Methotrexate, Tape, Tapentadol, and Verapamil hcl er  Medications: Prior to Admission medications   Medication Sig Start Date End Date Taking? Authorizing Provider  albuterol (VENTOLIN HFA) 108 (90 Base) MCG/ACT inhaler INHALE 2 PUFFS BY MOUTH EVERY 4 HOURS AS NEEDED Patient taking differently: Inhale 2 puffs into the lungs every 4 (four) hours as needed for wheezing or shortness of breath. 10/06/22  Yes Jetty Duhamel D, MD  fluticasone-salmeterol (ADVAIR) 250-50 MCG/ACT AEPB INHALE 1 PUFF INTO LUNGS TWICE DAILY 05/20/22  Yes Jetty Duhamel D, MD  letrozole [facility]) 2.5 MG tablet Take 1 tablet (2.5 mg total) by mouth daily. 11/10/22  Yes Serena Croissant, MD  Multiple Vitamin (MULTIVITAMIN) tablet Take 1 tablet by mouth daily.   Yes [provider]  pantoprazole (PROTONIX) 40 MG tablet Take 1 tablet (40 mg total) by mouth daily. 10/29/22  Yes [name], MD  predniSONE (  DELTASONE) 10 MG tablet 1 daily or as directed 12/06/22  Yes Young, Clinton D, MD  amiodarone (PACERONE) 200 MG tablet Take 2 tablets (400 mg total) by mouth 2 (two) times daily for 5 days, THEN 1 tablet (200 mg total) daily for 21 days. Stop amiodarone after taking 200mg  daily for 21 days.. 10/27/22 11/22/22   Burnadette Pop, MD  ammonium lactate (LAC-HYDRIN) 12 % cream Apply 1 Application topically as needed for dry skin. 04/28/22   Ellsworth Lennox, PA-C  apixaban (ELIQUIS) 5 MG TABS tablet Take 1 tablet by mouth twice daily 07/26/22   Lanier Prude, MD  DUPIXENT 300 MG/2ML SOPN Inject 300 mg into the skin every 14 (fourteen) days. 10/30/21   [provider]  Sodium Chloride Flush (NORMAL SALINE FLUSH) 0.9 % SOLN Flush drain once daily with 5ml of saline 12/02/22   Kennieth Francois, PA  triamcinolone cream (KENALOG) 0.1 % Apply 1 Application topically 2 (two) times daily. 04/28/22   Ellsworth Lennox, PA-C     Family History  Problem Relation Age of Onset   COPD Father    Colon cancer Mother 37       mets to pancreas and liver   Kidney cancer Sister 49       d. 38   Lung cancer Sister        d. 18, smoker   Ovarian cancer Sister 76       d. 62   Cirrhosis Sister        d. 71   Melanoma Sister 3   Ovarian cancer Maternal Grandmother        d. 77   Heart disease Maternal Grandfather        rheumatic heart disease   Rectal cancer Paternal Grandfather        d. 29   Parkinson's disease Brother    Ovarian cancer Maternal Aunt    Colon cancer Maternal Uncle        d. 72-73   Heart attack Paternal Uncle        d. 45   Kidney cancer Brother 32   Colon cancer Maternal Uncle        d. 46   Cancer Other        MGMs pat 1/2 sister with uterine and rectal cancer   Colon cancer Cousin        d. 47 - mat first cousin   Breast cancer Neg Hx     Social History   Socioeconomic History   Marital status: Divorced    Spouse name: Not on file   Number of children: Not on file   Years of education: Not on file   Highest education level: Not on file  Occupational History   Occupation: retired  Tobacco Use   Smoking status: Former    Current packs/day: 0.00    Types: Cigarettes    Start date: 05/24/1977    Quit date: 05/24/1977    Years since quitting: 45.6   Smokeless tobacco: Never    Tobacco comments:    7/7 pt states she occasionally smoked, let cigs "burn" more than she smoked them  Vaping Use   Vaping status: Never Used  Substance and Sexual Activity   Alcohol use: No   Drug use: No   Sexual activity: Not on file  Other Topics Concern   Not on file  Social History Narrative   Right handed   1 coke per day   Lives alone  Social Determinants of Health   Financial Resource Strain: Patient Declined (11/09/2022)   Overall Financial Resource Strain (CARDIA)    Difficulty of Paying Living Expenses: Patient declined  Food Insecurity: No Food Insecurity (11/10/2022)   Hunger Vital Sign    Worried About Running Out of Food in the Last Year: Never true    Ran Out of Food in the Last Year: Never true  Transportation Needs: No Transportation Needs (11/10/2022)   PRAPARE - Administrator, Civil Service (Medical): No    Lack of Transportation (Non-Medical): No  Physical Activity: Inactive (11/09/2022)   Exercise Vital Sign    Days of Exercise per Week: 0 days    Minutes of Exercise per Session: 30 min  Stress: No Stress Concern Present (11/09/2022)   [employer] of Occupational Health - Occupational Stress Questionnaire    Feeling of Stress : Only a little  Social Connections: Unknown (11/09/2022)   Social Connection and Isolation Panel [NHANES]    Frequency of Communication with Friends and Family: Patient declined    Frequency of Social Gatherings with Friends and Family: Patient declined    Attends Religious Services: Patient declined    Database administrator or Organizations: No    Attends Engineer, structural: Not on file    Marital Status: Patient declined    Review of Systems: A 12 point ROS discussed and pertinent positives are indicated in the HPI above.  All other systems are negative.  Review of Systems  Constitutional:  Negative for activity change and fatigue.  Respiratory:  Negative for cough and shortness of breath.    Cardiovascular:  Negative for chest pain.  Gastrointestinal:  Negative for abdominal pain.  Neurological:  Negative for weakness.  Psychiatric/Behavioral:  Negative for behavioral problems and confusion.     Vital Signs: BP (!) 148/73   Pulse 63   Temp 97.9 F (36.6 C) (Temporal)   Resp 16   Ht 5\' 10"  (1.778 m)   Wt 198 lb (89.8 kg)   SpO2 94%   BMI 28.41 kg/m   Advance Care Plan: The advanced care plan/surrogate decision maker was discussed at the time of visit and documented in the medical record.    Physical Exam Vitals reviewed.  Cardiovascular:     Rate and Rhythm: Normal rate and regular rhythm.     Heart sounds: Normal heart sounds.  Pulmonary:     Effort: Pulmonary effort is normal.     Breath sounds: Normal breath sounds.  Abdominal:     Palpations: Abdomen is soft.     Tenderness: There is no abdominal tenderness.  Musculoskeletal:        General: Normal range of motion.  Skin:    General: Skin is warm.  Neurological:     Mental Status: She is alert and oriented to person, place, and time.  Psychiatric:        Behavior: Behavior normal.     Imaging: IR Radiologist Eval & Mgmt  Result Date: 12/13/2022 EXAM: NEW PATIENT OFFICE VISIT CHIEF COMPLAINT: See Epic note. HISTORY OF PRESENT ILLNESS: See Epic note. REVIEW OF SYSTEMS: See Epic note. PHYSICAL EXAMINATION: See Epic note. ASSESSMENT AND PLAN: See Epic note. Marliss Coots, MD [facility] Electronically Signed   By: Marliss Coots M.D.   On: 12/13/2022 10:14   IR Sinus/Fist Tube Chk-Non GI  Result Date: 12/02/2022 INDICATION: 82 year old woman underwent cholecystostomy drain placement on 10/21/2022 for acute calculus cholecystitis.  She returns today for drain evaluation. Drain was last exchanged on 11/02/2022. EXAM: Fluoroscopic cholangiogram through existing cholecystostomy drain MEDICATIONS: None ANESTHESIA/SEDATION: None FLUOROSCOPY TIME:   Radiation Exposure Index (as provided by the fluoroscopic device): 48 mGy Kerma COMPLICATIONS: None immediate. PROCEDURE: Informed written consent was obtained from the patient after a thorough discussion of the procedural risks, benefits and alternatives. All questions were addressed. Maximal Sterile Barrier Technique was utilized including caps, mask, sterile gowns, sterile gloves, sterile drape, hand hygiene and skin antiseptic. A timeout was performed prior to the initiation of the procedure. Patient positioned supine on the procedure table. Scout image demonstrated the existing 12 [place] cholecystostomy drain in appropriate position. Contrast administered through the drain under fluoroscopy showed numerous gallstones within the gallbladder lumen. Cystic and common bile ducts were patent. IMPRESSION: Cholangiogram demonstrates patent cystic and common bile duct. PLAN: 1. Drain was capped. Instructions to reattached to bag were provided to patient and family in case of worsening pain, fever, chills, increasing drainage around entry site, nausea, or vomiting. 2. Patient is scheduled to see Dr. Elby Showers on 12/13/2022 for possible Spyglass procedure. Electronically Signed   By: Acquanetta Belling M.D.   On: 12/02/2022 13:40    Labs:  CBC: Recent Labs    11/06/22 0139 11/07/22 0333 11/08/22 0014 12/29/22 0925  WBC 6.6 7.0 7.5 7.6  HGB 11.8* 11.9* 11.9* 13.9  HCT 37.2 38.2 38.9 44.7  PLT 311 290 281 300    COAGS: Recent Labs    10/19/22 1123 10/20/22 0225 10/20/22 2145 10/21/22 0207 10/21/22 0640 10/22/22 0207 11/01/22 2222  INR 2.0*  --   --   --   --   --  1.5*  APTT  --    < > 89* 157* 70* 69*  --    < > = values in this interval not displayed.    BMP: Recent Labs    11/04/22 0003 11/05/22 0011 11/06/22 0139 11/07/22 0333  NA 139 140 142 137  K 3.9 4.2 3.7 3.8  CL 104 105 101 103  CO2 25 22 27 26   GLUCOSE 101* 87 108* 99  BUN 17 14 10 10   CALCIUM 8.7* 9.2 9.2 8.8*  CREATININE  1.04* 1.18* 0.83 0.90  GFRNONAA 54* 46* >60 >60    LIVER FUNCTION TESTS: Recent Labs    11/04/22 0003 11/05/22 0011 11/06/22 0139 11/07/22 0333  BILITOT 0.7 0.5 0.5 0.8  AST 93* 40 22 20  ALT 96* 64* 47* 34  ALKPHOS 153* 138* 116 100  PROT 6.0* 5.8* 5.9* 5.9*  ALBUMIN 2.9* 2.8* 2.9* 2.9*    TUMOR MARKERS: No results for input(s): "AFPTM", "CEA", "CA199", "CHROMGRNA" in the last 8760 hours.  Assessment and Plan:  Scheduled for Biliary drain evaluation/exchange/upsize and probable Spyglass stone retrieval procedure today in IR Pt is aware of procedure benefits and risks including but not limited to Infection; bleeding; damage to surrounding structures Agreeable to proceed Consent signed and in chart  Thank you for this interesting consult.  I greatly enjoyed meeting Laura Mendez and look forward to participating in their care.  A copy of this report was sent to the requesting provider on this date.  Electronically Signed: Robet Leu, PA-C 12/29/2022, 10:09 AM   I spent a total of    25 Minutes in face to face in clinical consultation, greater than 50% of which was counseling/coordinating care for biliary drain evaluation/exchange/upsize with probable Spyglass stone retrieval procedure

## 2022-12-31 DIAGNOSIS — K869 Disease of pancreas, unspecified: Secondary | ICD-10-CM | POA: Diagnosis not present

## 2022-12-31 DIAGNOSIS — C50511 Malignant neoplasm of lower-outer quadrant of right female breast: Secondary | ICD-10-CM | POA: Diagnosis not present

## 2022-12-31 DIAGNOSIS — K8012 Calculus of gallbladder with acute and chronic cholecystitis without obstruction: Secondary | ICD-10-CM | POA: Diagnosis not present

## 2022-12-31 DIAGNOSIS — I5032 Chronic diastolic (congestive) heart failure: Secondary | ICD-10-CM | POA: Diagnosis not present

## 2022-12-31 DIAGNOSIS — I480 Paroxysmal atrial fibrillation: Secondary | ICD-10-CM | POA: Diagnosis not present

## 2022-12-31 DIAGNOSIS — Z48815 Encounter for surgical aftercare following surgery on the digestive system: Secondary | ICD-10-CM | POA: Diagnosis not present

## 2023-01-01 ENCOUNTER — Telehealth: Payer: Self-pay | Admitting: Diagnostic Radiology

## 2023-01-01 NOTE — Telephone Encounter (Signed)
Patient's son called to tell us that his mother was complaining of nausea and bloating today.  Son uncapped the cholecystostomy tube and flushed the tube.  Small amount of bloody fluid came out when tube was attached to a bag.  The bloating or fullness is located below the tube.  No other significant symptoms.  Specifically, no fever or chills.  We will follow up with patient tomorrow by telephone to see if she needs to be seen this week in clinic.

## 2023-01-02 ENCOUNTER — Telehealth: Payer: Self-pay | Admitting: Radiology

## 2023-01-02 NOTE — Telephone Encounter (Cosign Needed)
   Known to IR Procedure performed 12/29/22 Procedure:  1) Percutaneous cholecystogram 2) Cholangioscopy 3) Percutaneous gallstone retrieval 4) Cholecystostomy drain exchange  Findings: Please refer to procedural dictation for full description.  Percutaneous gallstone retrieval yielding large volume of small gallstones.  Approximately 60% of gallstones removed.  Replaced cholecystostomy drain, 14 Fr, to bag drainage.    Pt was discharge with capped drain 8/7 Was educated about if any fever; abd pain; N/V develop they were to place drain back to bag  Pts son called yesterday 8/10, and spoke to Dr Lowella Dandy  Says his mother started to have Nausea and abd pain/bloating They placed bag to drainage yesterday and she was feeling better already.  Dr Lowella Dandy documented this and asked me to call pt this am  Son states his mom is feeling great today No abd pain; no bloating No nausea No fever Drainage is bilious color Flushes and aspirates easily  All is well Appreciates the call to check on them He is awaiting call from IR scheduler for ~Sept 4 follow up appointment

## 2023-01-03 ENCOUNTER — Telehealth: Payer: Self-pay | Admitting: *Deleted

## 2023-01-03 ENCOUNTER — Other Ambulatory Visit (HOSPITAL_COMMUNITY): Payer: Self-pay | Admitting: Interventional Radiology

## 2023-01-03 DIAGNOSIS — K8000 Calculus of gallbladder with acute cholecystitis without obstruction: Secondary | ICD-10-CM

## 2023-01-03 NOTE — Telephone Encounter (Signed)
   Pre-operative Risk Assessment    Patient Name: Laura Mendez  DOB: 12/03/1940 MRN: [id number]    PT WAS CLEARED ON 12/14/22 FOR SAME PROCEDURE; THIS IS A REPEAT ON SAME PROCEDURE.   Request for Surgical Clearance    Procedure:   REPEAT IR GUIDED GALLSTONE RETRIEVAL (SPYGLASS)  Date of Surgery:  Clearance 01/26/23                                 Surgeon:  DR. Marliss Coots Surgeon's Group or Practice Name:  [facility] Phone number:  [phone] ATTN: Feliciana Rossetti Fax number:  [phone]   Type of Clearance Requested:   - Medical  - Pharmacy:  Hold Apixaban (Eliquis) x 4 DOSES   Type of Anesthesia:   MODERATE SEDATION   Additional requests/questions:    Elpidio Anis   01/03/2023, 12:22 PM

## 2023-01-04 ENCOUNTER — Telehealth: Payer: Self-pay | Admitting: *Deleted

## 2023-01-04 NOTE — Telephone Encounter (Signed)
Called and s/w the pt's son who is pt's caretaker and POA. Scheduled tele pre op appt 01/13/23 @ 10 am. Med rec and consent are done.      Patient Consent for Virtual Visit        Laura Mendez has provided verbal consent on 01/04/2023 for a virtual visit (video or telephone).   CONSENT FOR VIRTUAL VISIT FOR:  Laura Mendez  By participating in this virtual visit I agree to the following:  I hereby voluntarily request, consent and authorize [facility] and its employed or contracted physicians, physician assistants, nurse practitioners or other licensed health care professionals (the Practitioner), to provide me with telemedicine health care services (the "Services") as deemed necessary by the treating Practitioner. I acknowledge and consent to receive the Services by the Practitioner via telemedicine. I understand that the telemedicine visit will involve communicating with the Practitioner through live audiovisual communication technology and the disclosure of certain medical information by electronic transmission. I acknowledge that I have been given the opportunity to request an in-person assessment or other available alternative prior to the telemedicine visit and am voluntarily participating in the telemedicine visit.  I understand that I have the right to withhold or withdraw my consent to the use of telemedicine in the course of my care at any time, without affecting my right to future care or treatment, and that the Practitioner or I may terminate the telemedicine visit at any time. I understand that I have the right to inspect all information obtained and/or recorded in the course of the telemedicine visit and may receive copies of available information for a reasonable fee.  I understand that some of the potential risks of receiving the Services via telemedicine include:  Delay or interruption in medical evaluation due to technological equipment failure or  disruption; Information transmitted may not be sufficient (e.g. poor resolution of images) to allow for appropriate medical decision making by the Practitioner; and/or  In rare instances, security protocols could fail, causing a breach of personal health information.  Furthermore, I acknowledge that it is my responsibility to provide information about my medical history, conditions and care that is complete and accurate to the best of my ability. I acknowledge that Practitioner's advice, recommendations, and/or decision may be based on factors not within their control, such as incomplete or inaccurate data provided by me or distortions of diagnostic images or specimens that may result from electronic transmissions. I understand that the practice of medicine is not an exact science and that Practitioner makes no warranties or guarantees regarding treatment outcomes. I acknowledge that a copy of this consent can be made available to me via my patient portal [facility] MyChart), or I can request a printed copy by calling the office of [facility].    I understand that my insurance will be billed for this visit.   I have read or had this consent read to me. I understand the contents of this consent, which adequately explains the benefits and risks of the Services being provided via telemedicine.  I have been provided ample opportunity to ask questions regarding this consent and the Services and have had my questions answered to my satisfaction. I give my informed consent for the services to be provided through the use of telemedicine in my medical care

## 2023-01-04 NOTE — Telephone Encounter (Signed)
Called and s/w the pt's son who is pt's caretaker and POA. Scheduled tele pre op appt 01/13/23 @ 10 am. Med rec and consent are done.

## 2023-01-04 NOTE — Telephone Encounter (Signed)
   Name: Laura Mendez  DOB: [birth year]  MRN: [id number]  Primary Cardiologist: Donato Schultz, MD  Chart reviewed as part of pre-operative protocol coverage. Because of Sokha Cinco past medical history and time since last visit, she will require a follow-up telephone visit in order to better assess preoperative cardiovascular risk.  Pre-op covering staff: - Please schedule appointment and call patient to inform them. If patient already had an upcoming appointment within acceptable timeframe, please add "pre-op clearance" to the appointment notes so provider is aware. - Please contact requesting surgeon's office via preferred method (i.e, phone, fax) to inform them of need for appointment prior to surgery.   Per office protocol, patient can hold Eliquis for 2 days prior to procedure. Resume as soon as safely possible after given elevated CV risk.   Jonita Albee, PA-C  01/04/2023, 10:00 AM

## 2023-01-04 NOTE — Telephone Encounter (Signed)
Patient with diagnosis of afib on Eliquis for anticoagulation.    Procedure: repeat IR guided gallstone retrieval (spyglass) Date of procedure: 01/26/23  CHA2DS2-VASc Score = 7  This indicates a 11.2% annual risk of stroke. The patient's score is based upon: CHF History: 1 HTN History: 0 Diabetes History: 0 Stroke History: 2 Vascular Disease History: 1 Age Score: 2 Gender Score: 1   CVA Feb 2023.   CrCl 61mL/min Platelet count 300K   Per office protocol, patient can hold Eliquis for 2 days prior to procedure. Resume as soon as safely possible after given elevated CV risk.    **This guidance is not considered finalized until pre-operative APP has relayed final recommendations.**

## 2023-01-05 ENCOUNTER — Encounter: Payer: Self-pay | Admitting: Adult Health

## 2023-01-05 ENCOUNTER — Ambulatory Visit (INDEPENDENT_AMBULATORY_CARE_PROVIDER_SITE_OTHER): Payer: Medicare Other | Admitting: Adult Health

## 2023-01-05 ENCOUNTER — Telehealth (HOSPITAL_COMMUNITY): Payer: Self-pay

## 2023-01-05 VITALS — BP 160/90 | HR 70 | Temp 97.8°F | Ht 70.0 in

## 2023-01-05 DIAGNOSIS — Z48815 Encounter for surgical aftercare following surgery on the digestive system: Secondary | ICD-10-CM | POA: Diagnosis not present

## 2023-01-05 DIAGNOSIS — N3001 Acute cystitis with hematuria: Secondary | ICD-10-CM | POA: Diagnosis not present

## 2023-01-05 DIAGNOSIS — K869 Disease of pancreas, unspecified: Secondary | ICD-10-CM | POA: Diagnosis not present

## 2023-01-05 DIAGNOSIS — I5032 Chronic diastolic (congestive) heart failure: Secondary | ICD-10-CM | POA: Diagnosis not present

## 2023-01-05 DIAGNOSIS — I480 Paroxysmal atrial fibrillation: Secondary | ICD-10-CM | POA: Diagnosis not present

## 2023-01-05 DIAGNOSIS — T7840XA Allergy, unspecified, initial encounter: Secondary | ICD-10-CM

## 2023-01-05 DIAGNOSIS — C50511 Malignant neoplasm of lower-outer quadrant of right female breast: Secondary | ICD-10-CM | POA: Diagnosis not present

## 2023-01-05 DIAGNOSIS — K8012 Calculus of gallbladder with acute and chronic cholecystitis without obstruction: Secondary | ICD-10-CM | POA: Diagnosis not present

## 2023-01-05 LAB — POC URINALSYSI DIPSTICK (AUTOMATED)
Bilirubin, UA: NEGATIVE
Glucose, UA: NEGATIVE
Ketones, UA: NEGATIVE
Nitrite, UA: NEGATIVE
Protein, UA: POSITIVE — AB
Spec Grav, UA: 1.015 (ref 1.010–1.025)
Urobilinogen, UA: 0.2 E.U./dL
pH, UA: 6 (ref 5.0–8.0)

## 2023-01-05 MED ORDER — METHYLPREDNISOLONE ACETATE 80 MG/ML IJ SUSP
80.0000 mg | Freq: Once | INTRAMUSCULAR | Status: AC
Start: 2023-01-05 — End: 2023-01-05
  Administered 2023-01-05: 80 mg via INTRAMUSCULAR

## 2023-01-05 MED ORDER — NITROFURANTOIN MONOHYD MACRO 100 MG PO CAPS
100.0000 mg | ORAL_CAPSULE | Freq: Two times a day (BID) | ORAL | 0 refills | Status: DC
Start: 2023-01-05 — End: 2023-02-25

## 2023-01-05 NOTE — Telephone Encounter (Signed)
Pt's son called bc pt was complaining of abdominal pain, little to no output in drain and blood in urine. Home health nurse stopped by this morning and seems to think that the pt has a UTI based off the blood in urine. Spoke to Brayton El, [place] who recommended f/u with PCP for UTI check. Little output in the drain is common and it wouldn't cause blood in urine. If she is having pain at the site he would recommend coming in so that we can take a look at the drain. Mr. Delone doesn't feel that she is having pain at the actual drain site just more across her abdomen. He will call her PCP to try and get her in to eval. He agreed to give [place] a call if she has issue with drain. AB

## 2023-01-05 NOTE — Patient Instructions (Signed)
Health Maintenance Due  Topic Date Due   Zoster Vaccines- Shingrix (2 of 2) 12/30/2020   COVID-19 Vaccine (5 - 2023-24 season) 01/22/2022   Medicare Annual Wellness (AWV)  11/17/2022   INFLUENZA VACCINE  12/23/2022       12/08/2021   12:12 PM 11/16/2021    9:33 AM 11/16/2021    9:31 AM  Depression screen PHQ 2/9  Decreased Interest 0 0 0  Down, Depressed, Hopeless 0 0 0  PHQ - 2 Score 0 0 0

## 2023-01-05 NOTE — Progress Notes (Signed)
Subjective:    Patient ID: Laura Mendez, female    DOB: May 11, 1941, 82 y.o.   MRN: [id number]  Abdominal Pain Associated symptoms include dysuria.  Dysuria     82 year old female who  has a past medical history of Asthma, Breast cancer (HCC), Chronic airway obstruction, not elsewhere classified, Diastolic dysfunction without heart failure, Disorder of bone and cartilage, unspecified, Dysrhythmia, Family history of colon cancer, Family history of kidney cancer, Family history of ovarian cancer, Paroxysmal atrial flutter (HCC), Stroke (HCC), TIA (transient ischemic attack), and Unspecified asthma(493.90).  She presents to the office today for an acute issue. She is with her son today. She recently had gallbladder surgery due to gallstones. She has drain in pace.   Her symptoms started about a day ago with lower abdominal pain and blood in her urine. She has not had any fevers, chills, dysuria or urgency. She has had UTI's in the past with similar symptoms   She also has " hives" on her back that are very itchy. She has been seen by dermatology in the past and had steroid injections which help alleviate the itching. Her son reports that no cause has been found on why she gets these hives.   Review of Systems  Gastrointestinal:  Positive for abdominal pain.  Genitourinary:  Positive for dysuria.   See HPI   Past Medical History:  Diagnosis Date   Asthma    Breast cancer (HCC)    Chronic airway obstruction, not elsewhere classified    Diastolic dysfunction without heart failure    Disorder of bone and cartilage, unspecified    Dysrhythmia    A-Fib   Family history of colon cancer    Family history of kidney cancer    Family history of ovarian cancer    Paroxysmal atrial flutter (HCC)    Stroke (HCC)    TIA   TIA (transient ischemic attack)    Unspecified asthma(493.90)     Social History   Socioeconomic History   Marital status: Divorced    Spouse name: Not on  file   Number of children: Not on file   Years of education: Not on file   Highest education level: Not on file  Occupational History   Occupation: retired  Tobacco Use   Smoking status: Former    Current packs/day: 0.00    Types: Cigarettes    Start date: 05/24/1977    Quit date: 05/24/1977    Years since quitting: 45.6   Smokeless tobacco: Never   Tobacco comments:    7/7 pt states she occasionally smoked, let cigs "burn" more than she smoked them  Vaping Use   Vaping status: Never Used  Substance and Sexual Activity   Alcohol use: No   Drug use: No   Sexual activity: Not on file  Other Topics Concern   Not on file  Social History Narrative   Right handed   1 coke per day   Lives alone   Social Determinants of Health   Financial Resource Strain: Patient Declined (11/09/2022)   Overall Financial Resource Strain (CARDIA)    Difficulty of Paying Living Expenses: Patient declined  Food Insecurity: No Food Insecurity (11/10/2022)   Hunger Vital Sign    Worried About Running Out of Food in the Last Year: Never true    Ran Out of Food in the Last Year: Never true  Transportation Needs: No Transportation Needs (11/10/2022)   PRAPARE - Transportation    Lack of  Transportation (Medical): No    Lack of Transportation (Non-Medical): No  Physical Activity: Unknown (11/09/2022)   Exercise Vital Sign    Days of Exercise per Week: 0 days    Minutes of Exercise per Session: Not on file  Recent Concern: Physical Activity - Inactive (11/09/2022)   Exercise Vital Sign    Days of Exercise per Week: 0 days    Minutes of Exercise per Session: 30 min  Stress: No Stress Concern Present (11/09/2022)   [employer] of Occupational Health - Occupational Stress Questionnaire    Feeling of Stress : Only a little  Social Connections: Unknown (11/09/2022)   Social Connection and Isolation Panel [NHANES]    Frequency of Communication with Friends and Family: Patient declined    Frequency of Social  Gatherings with Friends and Family: Patient declined    Attends Religious Services: Patient declined    Active Member of Clubs or Organizations: No    Attends Engineer, structural: Not on file    Marital Status: Patient declined  Intimate Partner Violence: Not At Risk (11/02/2022)   Humiliation, Afraid, Rape, and Kick questionnaire    Fear of Current or Ex-Partner: No    Emotionally Abused: No    Physically Abused: No    Sexually Abused: No    Past Surgical History:  Procedure Laterality Date   ABDOMINAL HYSTERECTOMY     BREAST LUMPECTOMY Right 12/30/2017   BREAST LUMPECTOMY WITH RADIOACTIVE SEED LOCALIZATION Right 12/30/2017   Procedure: BREAST LUMPECTOMY WITH RADIOACTIVE SEED LOCALIZATION X'S 3;  Surgeon: Emelia Loron, MD;  Location: [facility] OR;  Service: General;  Laterality: Right;   BUNIONECTOMY Bilateral    EYE SURGERY Bilateral    cataract removal   IR EXCHANGE BILIARY DRAIN  11/02/2022   IR EXCHANGE BILIARY DRAIN  12/29/2022   IR PERC CHOLECYSTOSTOMY  10/21/2022   IR RADIOLOGIST EVAL & MGMT  12/13/2022   IR REMOVAL OF CALCULI/DEBRIS BILIARY DUCT/GB  12/29/2022   IR SINUS/FIST TUBE CHK-NON GI  12/02/2022   LEFT HEART CATH AND CORONARY ANGIOGRAPHY N/A 11/23/2021   Procedure: LEFT HEART CATH AND CORONARY ANGIOGRAPHY;  Surgeon: Tonny Bollman, MD;  Location: [facility] INVASIVE CV LAB;  Service: Cardiovascular;  Laterality: N/A;    Family History  Problem Relation Age of Onset   COPD Father    Colon cancer Mother 9       mets to pancreas and liver   Kidney cancer Sister 85       d. 76   Lung cancer Sister        d. 43, smoker   Ovarian cancer Sister 41       d. 55   Cirrhosis Sister        d. 62   Melanoma Sister 10   Ovarian cancer Maternal Grandmother        d. 46   Heart disease Maternal Grandfather        rheumatic heart disease   Rectal cancer Paternal Grandfather        d. 41   Parkinson's disease Brother    Ovarian cancer Maternal Aunt    Colon cancer Maternal  Uncle        d. 72-73   Heart attack Paternal Uncle        d. 80   Kidney cancer Brother 35   Colon cancer Maternal Uncle        d. 24   Cancer Other        MGMs pat 1/2  sister with uterine and rectal cancer   Colon cancer Cousin        d. 7 - mat first cousin   Breast cancer Neg Hx     Allergies  Allergen Reactions   Clarithromycin Anaphylaxis    "just about killed me"   Aspirin Hives   Ibuprofen Hives   Codeine Nausea And Vomiting   Diltiazem Rash   Methotrexate Other (See Comments)     tongue swelling and mouth soreness   Tape Itching and Other (See Comments)    Tears skin   Tapentadol Itching   Verapamil Hcl Er Rash    Current Outpatient Medications on File Prior to Visit  Medication Sig Dispense Refill   albuterol (VENTOLIN HFA) 108 (90 Base) MCG/ACT inhaler INHALE 2 PUFFS BY MOUTH EVERY 4 HOURS AS NEEDED (Patient taking differently: Inhale 2 puffs into the lungs every 4 (four) hours as needed for wheezing or shortness of breath.) 18 g 3   amiodarone (PACERONE) 200 MG tablet Take 2 tablets (400 mg total) by mouth 2 (two) times daily for 5 days, THEN 1 tablet (200 mg total) daily for 21 days. Stop amiodarone after taking 200mg  daily for 21 days.. 41 tablet 0   ammonium lactate (LAC-HYDRIN) 12 % cream Apply 1 Application topically as needed for dry skin. 90 g 1   apixaban (ELIQUIS) 5 MG TABS tablet Take 1 tablet by mouth twice daily 180 tablet 1   DUPIXENT 300 MG/2ML SOPN Inject 300 mg into the skin every 14 (fourteen) days.     fluticasone-salmeterol (ADVAIR) 250-50 MCG/ACT AEPB INHALE 1 PUFF INTO LUNGS TWICE DAILY 60 each 11   letrozole (FEMARA) 2.5 MG tablet Take 1 tablet (2.5 mg total) by mouth daily. 90 tablet 0   Multiple Vitamin (MULTIVITAMIN) tablet Take 1 tablet by mouth daily.     pantoprazole (PROTONIX) 40 MG tablet Take 1 tablet (40 mg total) by mouth daily. 90 tablet 0   predniSONE (DELTASONE) 10 MG tablet 1 daily or as directed 50 tablet 0   Sodium Chloride  Flush (NORMAL SALINE FLUSH) 0.9 % SOLN Flush drain once daily with 5ml of saline 400 mL 0   triamcinolone cream (KENALOG) 0.1 % Apply 1 Application topically 2 (two) times daily. 30 g 0   Current Facility-Administered Medications on File Prior to Visit  Medication Dose Route Frequency Provider Last Rate Last Admin   Mepolizumab SOLR 100 mg  100 mg Subcutaneous Q28 days Young, Clinton D, MD   100 mg at 12/20/17 1455    BP (!) 160/90   Pulse 70   Temp 97.8 F (36.6 C) (Oral)   Ht 5\' 10"  (1.778 m)   SpO2 97%   BMI 28.41 kg/m       Objective:   Physical Exam Vitals and nursing note reviewed.  Constitutional:      Appearance: She is well-developed.  Cardiovascular:     Rate and Rhythm: Normal rate and regular rhythm.     Pulses: Normal pulses.     Heart sounds: Normal heart sounds.  Pulmonary:     Effort: Pulmonary effort is normal.     Breath sounds: Normal breath sounds.  Abdominal:     General: Bowel sounds are normal.     Palpations: Abdomen is soft.     Tenderness: There is abdominal tenderness in the suprapubic area.     Comments: Drain in place, scant blood drainage    Skin:    General: Skin is warm and dry.  Findings: Rash (lower back and right elbow) present.  Neurological:     Mental Status: She is alert.  Psychiatric:        Mood and Affect: Mood normal.        Behavior: Behavior normal.        Thought Content: Thought content normal.        Judgment: Judgment normal.           Assessment & Plan:  1. Acute cystitis with hematuria - POC UA showed + blood, protein and leuks. Will send culture and start on Macrobid.  - nitrofurantoin, macrocrystal-monohydrate, (MACROBID) 100 MG capsule; Take 1 capsule (100 mg total) by mouth 2 (two) times daily.  Dispense: 10 capsule; Refill: 0  2. Allergic reaction, initial encounter  - methylPREDNISolone acetate (DEPO-MEDROL) injection 80 mg  Shirline Frees, NP

## 2023-01-06 LAB — URINE CULTURE
MICRO NUMBER:: 15330026
SPECIMEN QUALITY:: ADEQUATE

## 2023-01-07 ENCOUNTER — Other Ambulatory Visit (HOSPITAL_BASED_OUTPATIENT_CLINIC_OR_DEPARTMENT_OTHER): Payer: Self-pay

## 2023-01-07 ENCOUNTER — Telehealth: Payer: Self-pay | Admitting: Student

## 2023-01-07 MED ORDER — PREDNISONE 10 MG PO TABS: ORAL_TABLET | ORAL | 3 refills | Status: DC

## 2023-01-07 NOTE — Telephone Encounter (Signed)
Prednisone refilled

## 2023-01-07 NOTE — Telephone Encounter (Signed)
Call received from patient's son.   Patient is s/p percutaneous gall stones retrieval by Dr. Elby Showers on 12/30/22.  States that he took the patient to the PCP and she was started on antibiotic for possible UTI ( see telephone encounter note from 8/14.) Son states that the patient continues to feel bloated, there has been nothing coming out of her drain x 2 days. He was able to flush the drain w/ difficulty. Denies nausea or vomiting, had been having diarrhea since she started taking antibiotic.   Discussed with Dr.Suttle, ok to monitor as long as the drain can be flushed and patient does not have N/V, fever, or RUQ pain.  Above recommendation was discussed with son, he verbalized understanding and agreed with the plan.   Please call IR for questions and concerns.   Lynann Bologna Rhea Kaelin PA-C 01/07/2023 2:16 PM

## 2023-01-11 DIAGNOSIS — I480 Paroxysmal atrial fibrillation: Secondary | ICD-10-CM | POA: Diagnosis not present

## 2023-01-11 DIAGNOSIS — Z48815 Encounter for surgical aftercare following surgery on the digestive system: Secondary | ICD-10-CM | POA: Diagnosis not present

## 2023-01-11 DIAGNOSIS — I700 Atherosclerosis of aorta: Secondary | ICD-10-CM | POA: Diagnosis not present

## 2023-01-11 DIAGNOSIS — I4892 Unspecified atrial flutter: Secondary | ICD-10-CM | POA: Diagnosis not present

## 2023-01-11 DIAGNOSIS — K8012 Calculus of gallbladder with acute and chronic cholecystitis without obstruction: Secondary | ICD-10-CM | POA: Diagnosis not present

## 2023-01-11 DIAGNOSIS — K869 Disease of pancreas, unspecified: Secondary | ICD-10-CM | POA: Diagnosis not present

## 2023-01-11 DIAGNOSIS — C50511 Malignant neoplasm of lower-outer quadrant of right female breast: Secondary | ICD-10-CM | POA: Diagnosis not present

## 2023-01-11 DIAGNOSIS — J209 Acute bronchitis, unspecified: Secondary | ICD-10-CM | POA: Diagnosis not present

## 2023-01-11 DIAGNOSIS — I69318 Other symptoms and signs involving cognitive functions following cerebral infarction: Secondary | ICD-10-CM | POA: Diagnosis not present

## 2023-01-11 DIAGNOSIS — J440 Chronic obstructive pulmonary disease with acute lower respiratory infection: Secondary | ICD-10-CM | POA: Diagnosis not present

## 2023-01-11 DIAGNOSIS — I5032 Chronic diastolic (congestive) heart failure: Secondary | ICD-10-CM | POA: Diagnosis not present

## 2023-01-11 DIAGNOSIS — J439 Emphysema, unspecified: Secondary | ICD-10-CM | POA: Diagnosis not present

## 2023-01-12 DIAGNOSIS — Z48815 Encounter for surgical aftercare following surgery on the digestive system: Secondary | ICD-10-CM | POA: Diagnosis not present

## 2023-01-12 DIAGNOSIS — K8012 Calculus of gallbladder with acute and chronic cholecystitis without obstruction: Secondary | ICD-10-CM | POA: Diagnosis not present

## 2023-01-12 DIAGNOSIS — K869 Disease of pancreas, unspecified: Secondary | ICD-10-CM | POA: Diagnosis not present

## 2023-01-12 DIAGNOSIS — I5032 Chronic diastolic (congestive) heart failure: Secondary | ICD-10-CM | POA: Diagnosis not present

## 2023-01-12 DIAGNOSIS — I480 Paroxysmal atrial fibrillation: Secondary | ICD-10-CM | POA: Diagnosis not present

## 2023-01-12 DIAGNOSIS — C50511 Malignant neoplasm of lower-outer quadrant of right female breast: Secondary | ICD-10-CM | POA: Diagnosis not present

## 2023-01-13 ENCOUNTER — Ambulatory Visit: Payer: Medicare Other | Attending: Cardiovascular Disease

## 2023-01-13 DIAGNOSIS — Z01810 Encounter for preprocedural cardiovascular examination: Secondary | ICD-10-CM | POA: Diagnosis not present

## 2023-01-13 NOTE — Progress Notes (Signed)
Virtual Visit via Telephone Note   Because of Laura Mendez's co-morbid illnesses, she is at least at moderate risk for complications without adequate follow up.  This format is felt to be most appropriate for this patient at this time.  The patient did not have access to video technology/had technical difficulties with video requiring transitioning to audio format only (telephone).  All issues noted in this document were discussed and addressed.  No physical exam could be performed with this format.  Please refer to the patient's chart for her consent to telehealth for [facility].  Evaluation Performed:  Preoperative cardiovascular risk assessment _____________   Date:  01/13/2023   Patient ID:  Laura Mendez, DOB [birth year], MRN [id number] Patient Location:  Home Provider location:   Office  Primary Care Provider:  [name], MD Primary Cardiologist:  Donato Schultz, MD  Chief Complaint / Patient Profile   82 y.o. y/o female with a h/o PAF, TIA, HFpEF, breast CAD, asthma who is pending IR guided gallstone retrieval and presents today for telephonic preoperative cardiovascular risk assessment.  History of Present Illness    Laura Mendez is a 82 y.o. female who presents via audio/video conferencing for a telehealth visit today.  Pt was last seen in cardiology clinic on 11/15/2022 by Jari Favre, PA.  At that time Shaletta Cordero was doing well and was previously cleared for procedure during this visit. The patient is now pending procedure as outlined above. Since her last visit, she has been doing better and is functionally improved with achieving 5 metabolic equivalents of activity.  She denies any cardiac complaints or episodes of A-fib.  Her heart rate today by pulse ox was 66 bpm and oxygen saturation was 97%.  She denies chest pain, shortness of breath, lower extremity edema, fatigue, palpitations, melena, hematuria, hemoptysis,  diaphoresis, weakness, presyncope, syncope, orthopnea, and PND.   Past Medical History    Past Medical History:  Diagnosis Date   Asthma    Breast cancer (HCC)    Chronic airway obstruction, not elsewhere classified    Diastolic dysfunction without heart failure    Disorder of bone and cartilage, unspecified    Dysrhythmia    A-Fib   Family history of colon cancer    Family history of kidney cancer    Family history of ovarian cancer    Paroxysmal atrial flutter (HCC)    Stroke (HCC)    TIA   TIA (transient ischemic attack)    Unspecified asthma(493.90)    Past Surgical History:  Procedure Laterality Date   ABDOMINAL HYSTERECTOMY     BREAST LUMPECTOMY Right 12/30/2017   BREAST LUMPECTOMY WITH RADIOACTIVE SEED LOCALIZATION Right 12/30/2017   Procedure: BREAST LUMPECTOMY WITH RADIOACTIVE SEED LOCALIZATION X'S 3;  Surgeon: Emelia Loron, MD;  Location: [facility] OR;  Service: General;  Laterality: Right;   BUNIONECTOMY Bilateral    EYE SURGERY Bilateral    cataract removal   IR EXCHANGE BILIARY DRAIN  11/02/2022   IR EXCHANGE BILIARY DRAIN  12/29/2022   IR PERC CHOLECYSTOSTOMY  10/21/2022   IR RADIOLOGIST EVAL & MGMT  12/13/2022   IR REMOVAL OF CALCULI/DEBRIS BILIARY DUCT/GB  12/29/2022   IR SINUS/FIST TUBE CHK-NON GI  12/02/2022   LEFT HEART CATH AND CORONARY ANGIOGRAPHY N/A 11/23/2021   Procedure: LEFT HEART CATH AND CORONARY ANGIOGRAPHY;  Surgeon: Tonny Bollman, MD;  Location: [facility] INVASIVE CV LAB;  Service: Cardiovascular;  Laterality: N/A;    Allergies  Allergies  Allergen Reactions   Clarithromycin Anaphylaxis    "just about killed me"   Aspirin Hives   Ibuprofen Hives   Codeine Nausea And Vomiting   Diltiazem Rash   Methotrexate Other (See Comments)     tongue swelling and mouth soreness   Tape Itching and Other (See Comments)    Tears skin   Tapentadol Itching   Verapamil Hcl Er Rash    Home Medications    Prior to Admission medications   Medication Sig Start Date  End Date Taking? Authorizing Provider  albuterol (VENTOLIN HFA) 108 (90 Base) MCG/ACT inhaler INHALE 2 PUFFS BY MOUTH EVERY 4 HOURS AS NEEDED Patient taking differently: Inhale 2 puffs into the lungs every 4 (four) hours as needed for wheezing or shortness of breath. 10/06/22   Waymon Budge, MD  amiodarone (PACERONE) 200 MG tablet Take 2 tablets (400 mg total) by mouth 2 (two) times daily for 5 days, THEN 1 tablet (200 mg total) daily for 21 days. Stop amiodarone after taking 200mg  daily for 21 days.. 10/27/22 01/05/23  Burnadette Pop, MD  ammonium lactate (LAC-HYDRIN) 12 % cream Apply 1 Application topically as needed for dry skin. 04/28/22   Ellsworth Lennox, PA-C  apixaban (ELIQUIS) 5 MG TABS tablet Take 1 tablet by mouth twice daily 07/26/22   Lanier Prude, MD  DUPIXENT 300 MG/2ML SOPN Inject 300 mg into the skin every 14 (fourteen) days. 10/30/21   [provider]  fluticasone-salmeterol (ADVAIR) 250-50 MCG/ACT AEPB INHALE 1 PUFF INTO LUNGS TWICE DAILY 05/20/22   Jetty Duhamel D, MD  letrozole [facility]) 2.5 MG tablet Take 1 tablet (2.5 mg total) by mouth daily. 11/10/22   Serena Croissant, MD  Multiple Vitamin (MULTIVITAMIN) tablet Take 1 tablet by mouth daily.    [provider]  nitrofurantoin, macrocrystal-monohydrate, (MACROBID) 100 MG capsule Take 1 capsule (100 mg total) by mouth 2 (two) times daily. 01/05/23   Nafziger, Kandee Keen, NP  pantoprazole (PROTONIX) 40 MG tablet Take 1 tablet (40 mg total) by mouth daily. 10/29/22   [name], MD  predniSONE (DELTASONE) 10 MG tablet 1 daily or as directed 01/07/23   Jetty Duhamel D, MD  Sodium Chloride Flush (NORMAL SALINE FLUSH) 0.9 % SOLN Flush drain once daily with 5ml of saline 12/02/22   Kennieth Francois, PA  triamcinolone cream (KENALOG) 0.1 % Apply 1 Application topically 2 (two) times daily. 04/28/22   Ellsworth Lennox, PA-C    Physical Exam    Vital Signs:  Janira Durig does not have vital signs available for  review today.  Given telephonic nature of communication, physical exam is limited. AAOx3. NAD. Normal affect.  Speech and respirations are unlabored.  Accessory Clinical Findings    None  Assessment & Plan    1.  Preoperative Cardiovascular Risk Assessment: -Patient's RCRI score is 11% The patient was advised that if she develops new symptoms prior to surgery to contact our office to arrange for a follow-up visit, and she verbalized understanding.  The patient affirms she has been doing well without any new cardiac symptoms. They are able to achieve 5 METS without cardiac limitations. Therefore, based on ACC/AHA guidelines, the patient would be at acceptable risk for the planned procedure without further cardiovascular testing. The patient was advised that if she develops new symptoms prior to surgery to contact our office to arrange for a follow-up visit, and she verbalized understanding.   Patient can hold Eliquis 2 days prior to procedure and should restart  postprocedure when surgically safe and hemostasis is achieved.  A copy of this note will be routed to requesting surgeon.  Time:   Today, I have spent 7 minutes with the patient with telehealth technology discussing medical history, symptoms, and management plan.     Napoleon Form, Leodis Rains, NP  01/13/2023, 7:21 AM

## 2023-01-14 ENCOUNTER — Ambulatory Visit: Payer: Self-pay

## 2023-01-14 NOTE — Patient Instructions (Signed)
Visit Information  Thank you for taking time to visit with me today. Please don't hesitate to contact me if I can be of assistance to you.   Following are the goals we discussed today:  Continue to take medications as prescribed. Continue to attend provider visits as scheduled Continue to eat healthy, lean meats, vegetables, fruits, avoid saturated and transfats   Our next appointment is by telephone on 02/14/23 at 3:00 pm  Please call the care guide team at [phone] if you need to cancel or reschedule your appointment.   If you are experiencing a Mental Health or Behavioral Health Crisis or need someone to talk to, please call the Suicide and Crisis Lifeline: 988 call the [place] National Suicide Prevention Lifeline: 605 522 7618 or TTY: 412-690-7688 TTY (364) 568-9925) to talk to a trained counselor call 1-800-273-TALK (toll free, 24 hour hotline)

## 2023-01-14 NOTE — Patient Outreach (Signed)
  Care Coordination   Follow Up Visit Note   01/14/2023 Name: Laura Mendez MRN: [id number] DOB: 05-06-1941  Laura Mendez is a 82 y.o. year old female who sees Laura Broker, MD for primary care. I spoke with  Laura Mendez by phone today.  What matters to the patients health and wellness today?  Per son, patient had procedure to remove gallstones and is scheduled to have remaining removed on 01/26/23. Laura Mendez continues to have the drain. He denies any questions or concerns.  Goals Addressed             This Visit's Progress    continue to improve post hospitalization       Interventions Today    Flowsheet Row Most Recent Value  Chronic Disease   Chronic disease during today's visit Other  General Interventions   General Interventions Discussed/Reviewed General Interventions Reviewed  Doctor Visits Discussed/Reviewed Doctor Visits Reviewed  Education Interventions   Provided Verbal Education On When to see the doctor, Medication  [advised to continue to take medications as prescribed, continue to attend provider visits as scheduled, continue drain care as instructed, contact provider for health questions or concerns.]  Nutrition Interventions   Nutrition Discussed/Reviewed Nutrition Reviewed             SDOH assessments and interventions completed:  No  Care Coordination Interventions:  Yes, provided   Follow up plan: Follow up call scheduled for 02/14/23    Encounter Outcome:  Pt. Visit Completed   Laura Sheriff, RN, MSN, BSN, CCM Care Management Coordinator [phone]

## 2023-01-19 ENCOUNTER — Telehealth: Payer: Self-pay | Admitting: Hematology and Oncology

## 2023-01-19 DIAGNOSIS — I5032 Chronic diastolic (congestive) heart failure: Secondary | ICD-10-CM | POA: Diagnosis not present

## 2023-01-19 DIAGNOSIS — I480 Paroxysmal atrial fibrillation: Secondary | ICD-10-CM | POA: Diagnosis not present

## 2023-01-19 DIAGNOSIS — Z48815 Encounter for surgical aftercare following surgery on the digestive system: Secondary | ICD-10-CM | POA: Diagnosis not present

## 2023-01-19 DIAGNOSIS — K8012 Calculus of gallbladder with acute and chronic cholecystitis without obstruction: Secondary | ICD-10-CM | POA: Diagnosis not present

## 2023-01-19 DIAGNOSIS — C50511 Malignant neoplasm of lower-outer quadrant of right female breast: Secondary | ICD-10-CM | POA: Diagnosis not present

## 2023-01-19 DIAGNOSIS — K869 Disease of pancreas, unspecified: Secondary | ICD-10-CM | POA: Diagnosis not present

## 2023-01-19 NOTE — Telephone Encounter (Signed)
Rescheduled appointment per patients son's request due to procedure on 9/4. Talked with the patients sons Al, and he is aware of the changes made to the patients upcoming appointment.

## 2023-01-25 ENCOUNTER — Other Ambulatory Visit: Payer: Self-pay | Admitting: Radiology

## 2023-01-25 ENCOUNTER — Ambulatory Visit: Payer: Medicare Other | Admitting: Neurology

## 2023-01-25 ENCOUNTER — Other Ambulatory Visit: Payer: Self-pay | Admitting: Student

## 2023-01-25 DIAGNOSIS — K810 Acute cholecystitis: Secondary | ICD-10-CM

## 2023-01-26 ENCOUNTER — Other Ambulatory Visit (HOSPITAL_COMMUNITY): Payer: Self-pay | Admitting: Interventional Radiology

## 2023-01-26 ENCOUNTER — Encounter (HOSPITAL_COMMUNITY): Payer: Self-pay

## 2023-01-26 ENCOUNTER — Ambulatory Visit (HOSPITAL_COMMUNITY)
Admission: RE | Admit: 2023-01-26 | Discharge: 2023-01-26 | Disposition: A | Discharge status: Home / Self Care | Payer: Medicare Other | Source: Ambulatory Visit | Attending: Interventional Radiology | Admitting: Interventional Radiology

## 2023-01-26 DIAGNOSIS — K8000 Calculus of gallbladder with acute cholecystitis without obstruction: Secondary | ICD-10-CM | POA: Diagnosis not present

## 2023-01-26 DIAGNOSIS — K8020 Calculus of gallbladder without cholecystitis without obstruction: Secondary | ICD-10-CM | POA: Diagnosis not present

## 2023-01-26 DIAGNOSIS — T85590A Other mechanical complication of bile duct prosthesis, initial encounter: Secondary | ICD-10-CM | POA: Diagnosis not present

## 2023-01-26 DIAGNOSIS — S21209A Unspecified open wound of unspecified back wall of thorax without penetration into thoracic cavity, initial encounter: Secondary | ICD-10-CM | POA: Diagnosis not present

## 2023-01-26 DIAGNOSIS — Z8379 Family history of other diseases of the digestive system: Secondary | ICD-10-CM | POA: Diagnosis not present

## 2023-01-26 DIAGNOSIS — K810 Acute cholecystitis: Secondary | ICD-10-CM

## 2023-01-26 DIAGNOSIS — Z87891 Personal history of nicotine dependence: Secondary | ICD-10-CM | POA: Diagnosis not present

## 2023-01-26 DIAGNOSIS — Z7901 Long term (current) use of anticoagulants: Secondary | ICD-10-CM | POA: Insufficient documentation

## 2023-01-26 HISTORY — PX: IR REMOVAL BILIARY DRAIN: IMG6047

## 2023-01-26 HISTORY — PX: IR CHOLANGIOGRAM EXISTING TUBE: IMG6040

## 2023-01-26 HISTORY — PX: IR REMOVAL OF CALCULI/DEBRIS BILIARY DUCT/GB: IMG6054

## 2023-01-26 HISTORY — PX: IR US GUIDE VASC ACCESS LEFT: IMG2389

## 2023-01-26 HISTORY — PX: IR US GUIDE BX ASP/DRAIN: IMG2392

## 2023-01-26 LAB — CBC
HCT: 43.3 % (ref 36.0–46.0)
Hemoglobin: 13.6 g/dL (ref 12.0–15.0)
MCH: 27.1 pg (ref 26.0–34.0)
MCHC: 31.4 g/dL (ref 30.0–36.0)
MCV: 86.3 fL (ref 80.0–100.0)
Platelets: 283 10*3/uL (ref 150–400)
RBC: 5.02 MIL/uL (ref 3.87–5.11)
RDW: 15.6 % — ABNORMAL HIGH (ref 11.5–15.5)
WBC: 10.3 10*3/uL (ref 4.0–10.5)
nRBC: 0 % (ref 0.0–0.2)

## 2023-01-26 LAB — PROTIME-INR
INR: 1 (ref 0.8–1.2)
Prothrombin Time: 13.1 s (ref 11.4–15.2)

## 2023-01-26 MED ORDER — MIDAZOLAM HCL 2 MG/2ML IJ SOLN
INTRAMUSCULAR | Status: AC | PRN
Start: 2023-01-26 — End: 2023-01-26
  Administered 2023-01-26: .5 mg via INTRAVENOUS
  Administered 2023-01-26: 1 mg via INTRAVENOUS
  Administered 2023-01-26: .5 mg via INTRAVENOUS

## 2023-01-26 MED ORDER — MIDAZOLAM HCL 2 MG/2ML IJ SOLN
INTRAMUSCULAR | Status: AC
  Filled 2023-01-26: qty 4

## 2023-01-26 MED ORDER — ONDANSETRON HCL 4 MG/2ML IJ SOLN
INTRAMUSCULAR | Status: AC
  Filled 2023-01-26: qty 2

## 2023-01-26 MED ORDER — SODIUM CHLORIDE 0.9 % IV SOLN
INTRAVENOUS | Status: AC | PRN
  Administered 2023-01-26: 2 g via INTRAVENOUS

## 2023-01-26 MED ORDER — ONDANSETRON HCL 4 MG/2ML IJ SOLN
INTRAMUSCULAR | Status: AC | PRN
  Administered 2023-01-26: 4 mg via INTRAVENOUS

## 2023-01-26 MED ORDER — SODIUM CHLORIDE 0.9 % IV SOLN: 2.0000 g | Freq: Once | INTRAVENOUS | Status: DC

## 2023-01-26 MED ORDER — FENTANYL CITRATE (PF) 100 MCG/2ML IJ SOLN
INTRAMUSCULAR | Status: AC
  Filled 2023-01-26: qty 4

## 2023-01-26 MED ORDER — SODIUM CHLORIDE 0.9 % IV SOLN
INTRAVENOUS | Status: AC
  Filled 2023-01-26: qty 20

## 2023-01-26 MED ORDER — FENTANYL CITRATE (PF) 100 MCG/2ML IJ SOLN
INTRAMUSCULAR | Status: AC | PRN
Start: 2023-01-26 — End: 2023-01-26
  Administered 2023-01-26: 25 ug via INTRAVENOUS
  Administered 2023-01-26: 50 ug via INTRAVENOUS

## 2023-01-26 MED ORDER — IOHEXOL 300 MG/ML  SOLN
100.0000 mL | Freq: Once | INTRAMUSCULAR | Status: AC | PRN
  Administered 2023-01-26: 10 mL

## 2023-01-26 MED ORDER — SODIUM CHLORIDE 0.9 % IV SOLN: INTRAVENOUS | Status: DC

## 2023-01-26 MED ORDER — LIDOCAINE HCL 1 % IJ SOLN
INTRAMUSCULAR | Status: AC
  Filled 2023-01-26: qty 20

## 2023-01-26 NOTE — H&P (Signed)
Chief Complaint: Patient was seen in consultation today for Spyglass procedure/ Biliary drain evaluation   Supervising Physician: Marliss Coots  Patient Status: [facility] - Out-pt  History of Present Illness: Laura Mendez is a 82 y.o. female   FULL Code status per pt Known to IR Previous Spyglass procedure 12/29/22 60% retrieval achieved per notes IMPRESSION: 1. Technically successful percutaneous cholangioscopic assisted gallstone retrieval removing approximately 60% of the indwelling gallstones. 2. Technically successful percutaneous cholecystostomy tube exchange in upsized to 14 [place]. PLAN: Keep cholecystostomy tube to bag drainage. IR will arrange for 1 month repeat percutaneous cost and retrieval in hopes to remove the additional gallstones.  Scheduled now for additional procedure per Dr Elby Showers Pt is doing well No complaints; no pain Eating well Off Eliquis 2 days  Past Medical History:  Diagnosis Date   Asthma    Breast cancer (HCC)    Chronic airway obstruction, not elsewhere classified    Diastolic dysfunction without heart failure    Disorder of bone and cartilage, unspecified    Dysrhythmia    A-Fib   Family history of colon cancer    Family history of kidney cancer    Family history of ovarian cancer    Paroxysmal atrial flutter (HCC)    Stroke (HCC)    TIA   TIA (transient ischemic attack)    Unspecified asthma(493.90)     Past Surgical History:  Procedure Laterality Date   ABDOMINAL HYSTERECTOMY     BREAST LUMPECTOMY Right 12/30/2017   BREAST LUMPECTOMY WITH RADIOACTIVE SEED LOCALIZATION Right 12/30/2017   Procedure: BREAST LUMPECTOMY WITH RADIOACTIVE SEED LOCALIZATION X'S 3;  Surgeon: Emelia Loron, MD;  Location: [facility] OR;  Service: General;  Laterality: Right;   BUNIONECTOMY Bilateral    EYE SURGERY Bilateral    cataract removal   IR EXCHANGE BILIARY DRAIN  11/02/2022   IR EXCHANGE BILIARY DRAIN  12/29/2022   IR PERC  CHOLECYSTOSTOMY  10/21/2022   IR RADIOLOGIST EVAL & MGMT  12/13/2022   IR REMOVAL OF CALCULI/DEBRIS BILIARY DUCT/GB  12/29/2022   IR SINUS/FIST TUBE CHK-NON GI  12/02/2022   LEFT HEART CATH AND CORONARY ANGIOGRAPHY N/A 11/23/2021   Procedure: LEFT HEART CATH AND CORONARY ANGIOGRAPHY;  Surgeon: Tonny Bollman, MD;  Location: [facility] INVASIVE CV LAB;  Service: Cardiovascular;  Laterality: N/A;    Allergies: Clarithromycin, Aspirin, Ibuprofen, Codeine, Diltiazem, Methotrexate, Tape, Tapentadol, and Verapamil hcl er  Medications: Prior to Admission medications   Medication Sig Start Date End Date Taking? Authorizing Provider  albuterol (VENTOLIN HFA) 108 (90 Base) MCG/ACT inhaler INHALE 2 PUFFS BY MOUTH EVERY 4 HOURS AS NEEDED Patient taking differently: Inhale 2 puffs into the lungs every 4 (four) hours as needed for wheezing or shortness of breath. 10/06/22  Yes Young, Joni Fears D, MD  ammonium lactate (LAC-HYDRIN) 12 % cream Apply 1 Application topically as needed for dry skin. 04/28/22  Yes Ellsworth Lennox, PA-C  letrozole [facility]) 2.5 MG tablet Take 1 tablet (2.5 mg total) by mouth daily. 11/10/22  Yes Serena Croissant, MD  Multiple Vitamin (MULTIVITAMIN) tablet Take 1 tablet by mouth daily.   Yes [provider]  nitrofurantoin, macrocrystal-monohydrate, (MACROBID) 100 MG capsule Take 1 capsule (100 mg total) by mouth 2 (two) times daily. 01/05/23  Yes Nafziger, Kandee Keen, NP  pantoprazole (PROTONIX) 40 MG tablet Take 1 tablet (40 mg total) by mouth daily. 10/29/22  Yes [name], MD  predniSONE (DELTASONE) 10 MG tablet 1 daily or as directed 01/07/23  Yes Young, [place]  D, MD  triamcinolone cream (KENALOG) 0.1 % Apply 1 Application topically 2 (two) times daily. 04/28/22  Yes Ellsworth Lennox, PA-C  amiodarone (PACERONE) 200 MG tablet Take 2 tablets (400 mg total) by mouth 2 (two) times daily for 5 days, THEN 1 tablet (200 mg total) daily for 21 days. Stop amiodarone after taking 200mg  daily for 21 days..  10/27/22 01/05/23  Burnadette Pop, MD  apixaban (ELIQUIS) 5 MG TABS tablet Take 1 tablet by mouth twice daily 07/26/22   Lanier Prude, MD  DUPIXENT 300 MG/2ML SOPN Inject 300 mg into the skin every 14 (fourteen) days. 10/30/21   [provider]  fluticasone-salmeterol (ADVAIR) 250-50 MCG/ACT AEPB INHALE 1 PUFF INTO LUNGS TWICE DAILY 05/20/22   Jetty Duhamel D, MD  Sodium Chloride Flush (NORMAL SALINE FLUSH) 0.9 % SOLN Flush drain once daily with 5ml of saline 12/02/22   Kennieth Francois, PA     Family History  Problem Relation Age of Onset   COPD Father    Colon cancer Mother 37       mets to pancreas and liver   Kidney cancer Sister 66       d. 44   Lung cancer Sister        d. 28, smoker   Ovarian cancer Sister 86       d. 41   Cirrhosis Sister        d. 87   Melanoma Sister 87   Ovarian cancer Maternal Grandmother        d. 80   Heart disease Maternal Grandfather        rheumatic heart disease   Rectal cancer Paternal Grandfather        d. 63   Parkinson's disease Brother    Ovarian cancer Maternal Aunt    Colon cancer Maternal Uncle        d. 72-73   Heart attack Paternal Uncle        d. 20   Kidney cancer Brother 70   Colon cancer Maternal Uncle        d. 26   Cancer Other        MGMs pat 1/2 sister with uterine and rectal cancer   Colon cancer Cousin        d. 35 - mat first cousin   Breast cancer Neg Hx     Social History   Socioeconomic History   Marital status: Divorced    Spouse name: Not on file   Number of children: Not on file   Years of education: Not on file   Highest education level: Not on file  Occupational History   Occupation: retired  Tobacco Use   Smoking status: Former    Current packs/day: 0.00    Types: Cigarettes    Start date: 05/24/1977    Quit date: 05/24/1977    Years since quitting: 45.7   Smokeless tobacco: Never   Tobacco comments:    7/7 pt states she occasionally smoked, let cigs "burn" more than she smoked them   Vaping Use   Vaping status: Never Used  Substance and Sexual Activity   Alcohol use: No   Drug use: No   Sexual activity: Not on file  Other Topics Concern   Not on file  Social History Narrative   Right handed   1 coke per day   Lives alone   Social Determinants of Health   Financial Resource Strain: Patient Declined (11/09/2022)   Overall Financial  Resource Strain (CARDIA)    Difficulty of Paying Living Expenses: Patient declined  Food Insecurity: No Food Insecurity (11/10/2022)   Hunger Vital Sign    Worried About Running Out of Food in the Last Year: Never true    Ran Out of Food in the Last Year: Never true  Transportation Needs: No Transportation Needs (11/10/2022)   PRAPARE - Administrator, Civil Service (Medical): No    Lack of Transportation (Non-Medical): No  Physical Activity: Inactive (11/09/2022)   Exercise Vital Sign    Days of Exercise per Week: 0 days    Minutes of Exercise per Session: 30 min  Stress: No Stress Concern Present (11/09/2022)   [employer] of Occupational Health - Occupational Stress Questionnaire    Feeling of Stress : Only a little  Social Connections: Unknown (11/09/2022)   Social Connection and Isolation Panel [NHANES]    Frequency of Communication with Friends and Family: Patient declined    Frequency of Social Gatherings with Friends and Family: Patient declined    Attends Religious Services: Patient declined    Database administrator or Organizations: No    Attends Engineer, structural: Not on file    Marital Status: Patient declined    Review of Systems: A 12 point ROS discussed and pertinent positives are indicated in the HPI above.  All other systems are negative.  Vital Signs: BP (!) 143/86   Pulse 65   Temp (!) 97.3 F (36.3 C) (Temporal)   Resp 16   Ht 5\' 10"  (1.778 m)   Wt 196 lb (88.9 kg)   SpO2 94%   BMI 28.12 kg/m   Advance Care Plan: The advanced care plan/surrogate decision maker was  discussed at the time of visit and documented in the medical record.    Physical Exam Vitals reviewed.  HENT:     Mouth/Throat:     Mouth: Mucous membranes are moist.  Cardiovascular:     Rate and Rhythm: Rhythm irregular.  Pulmonary:     Effort: Pulmonary effort is normal.     Breath sounds: Normal breath sounds. No wheezing.  Abdominal:     Palpations: Abdomen is soft.     Tenderness: There is no abdominal tenderness.  Musculoskeletal:        General: Normal range of motion.  Skin:    General: Skin is warm.     Comments: Cholecystostomy drain intact Draining well  Neurological:     Mental Status: She is alert and oriented to person, place, and time.  Psychiatric:        Behavior: Behavior normal.     Imaging: IR REMOVAL OF CALCULI/DEBRIS BILIARY DUCT/GB  Result Date: 12/30/2022 INDICATION: 82 year old female with a medical history significant for acute calculous cholecystitis status post percutaneous cholecystostomy on 10/21/22 who is a poor surgical candidate for multiple comorbidities including history of stroke and heart failure. We discussed choledochoscopic guided percutaneous gallstone retrieval as an option to hopefully eventually be able to remove the cholecystotomy tube safely. The patient and family are agreeable and wish to proceed. EXAM: 1. Percutaneous cholecystogram. 2. Cholangioscopic-assisted percutaneous gallstone retrieval. 3. Exchange of cholecystostomy tube. MEDICATIONS: Ancef 2 gm IV; The antibiotic was administered within an appropriate time frame prior to the initiation of the procedure. ANESTHESIA/SEDATION: Moderate (conscious) sedation was employed during this procedure. A total of Versed 5 mg and Fentanyl 250 mcg was administered intravenously. Moderate Sedation Time: 80 minutes. The patient's level of consciousness and vital signs were  monitored continuously by radiology nursing throughout the procedure under my direct supervision. FLUOROSCOPY TIME:  Eight  hundred eighty-seven mGy COMPLICATIONS: None immediate. PROCEDURE: Informed written consent was obtained from the patient after a thorough discussion of the procedural risks, benefits and alternatives. All questions were addressed. Maximal Sterile Barrier Technique was utilized including caps, mask, sterile gowns, sterile gloves, sterile drape, hand hygiene and skin antiseptic. A timeout was performed prior to the initiation of the procedure. The right upper quadrant and indwelling percutaneous cholecystostomy tube for prepped and draped in standard fashion. Subdermal Local anesthesia was administered on the skin entry site. Hand injection of contrast demonstrated appropriate position of the indwelling drain within the gallbladder lumen where there are multiple filling defects compatible with cholelithiasis. The cystic duct is patent and drains into the common bile duct into the bowel. The external portion of the drain was cut to release inter pigtail and an Amplatz wire was coiled in the gallbladder lumen. The drain was removed and exchanged for a 12 French, 45 cm Ansel sheath with the tip positioned near the gallbladder infundibulum. Next, using a 4 French Navicross catheter and combination of Glidewire and air Stahl 14 microwire, the cystic duct was cannulated through the common bile duct into the duodenum. A V 18 safety wire was placed, coiled in the duodenum. The 12 French sheath was then placed over the V 18 with the tip in the gallbladder lumen through which the Amplatz wire was then placed. The sheath was removed. Serial dilation was performed over the Amplatz wire followed by placement of a 20 French peel-away sheath. Next, using a combination of which stone retrieval basket and cholangioscopic basket retrieval, copious small black gallstones were removed. Approximately 60% of the stone volume was removed. Through the peel-away sheath, a 14 [place] multipurpose drain was replaced. The wires were removed.  Injection of contrast demonstrated patency of the peripheral cystic duct with limited filling defects in the gallbladder infundibulum. The drain was sutured to the skin with a 0 silk interrupted suture and a sterile bandage was applied. The drain was placed to bag drainage. The patient tolerated the procedure well was transferred to the recovery area in good condition. IMPRESSION: 1. Technically successful percutaneous cholangioscopic assisted gallstone retrieval removing approximately 60% of the indwelling gallstones. 2. Technically successful percutaneous cholecystostomy tube exchange in upsized to 14 [place]. PLAN: Keep cholecystostomy tube to bag drainage. IR will arrange for 1 month repeat percutaneous cost and retrieval in hopes to remove the additional gallstones. Marliss Coots, MD [facility] Radiology Electronically Signed   By: Marliss Coots M.D.   On: 12/30/2022 09:25   IR EXCHANGE BILIARY DRAIN  Result Date: 12/30/2022 INDICATION: 82 year old female with a medical history significant for acute calculous cholecystitis status post percutaneous cholecystostomy on 10/21/22 who is a poor surgical candidate for multiple comorbidities including history of stroke and heart failure. We discussed choledochoscopic guided percutaneous gallstone retrieval as an option to hopefully eventually be able to remove the cholecystotomy tube safely. The patient and family are agreeable and wish to proceed. EXAM: 1. Percutaneous cholecystogram. 2. Cholangioscopic-assisted percutaneous gallstone retrieval. 3. Exchange of cholecystostomy tube. MEDICATIONS: Ancef 2 gm IV; The antibiotic was administered within an appropriate time frame prior to the initiation of the procedure. ANESTHESIA/SEDATION: Moderate (conscious) sedation was employed during this procedure. A total of Versed 5 mg and Fentanyl 250 mcg was administered intravenously. Moderate Sedation Time: 80 minutes. The  patient's level of consciousness and vital signs were  monitored continuously by radiology nursing throughout the procedure under my direct supervision. FLUOROSCOPY TIME:  Eight hundred eighty-seven mGy COMPLICATIONS: None immediate. PROCEDURE: Informed written consent was obtained from the patient after a thorough discussion of the procedural risks, benefits and alternatives. All questions were addressed. Maximal Sterile Barrier Technique was utilized including caps, mask, sterile gowns, sterile gloves, sterile drape, hand hygiene and skin antiseptic. A timeout was performed prior to the initiation of the procedure. The right upper quadrant and indwelling percutaneous cholecystostomy tube for prepped and draped in standard fashion. Subdermal Local anesthesia was administered on the skin entry site. Hand injection of contrast demonstrated appropriate position of the indwelling drain within the gallbladder lumen where there are multiple filling defects compatible with cholelithiasis. The cystic duct is patent and drains into the common bile duct into the bowel. The external portion of the drain was cut to release inter pigtail and an Amplatz wire was coiled in the gallbladder lumen. The drain was removed and exchanged for a 12 French, 45 cm Ansel sheath with the tip positioned near the gallbladder infundibulum. Next, using a 4 French Navicross catheter and combination of Glidewire and air Stahl 14 microwire, the cystic duct was cannulated through the common bile duct into the duodenum. A V 18 safety wire was placed, coiled in the duodenum. The 12 French sheath was then placed over the V 18 with the tip in the gallbladder lumen through which the Amplatz wire was then placed. The sheath was removed. Serial dilation was performed over the Amplatz wire followed by placement of a 20 French peel-away sheath. Next, using a combination of which stone retrieval basket and cholangioscopic basket retrieval, copious small black  gallstones were removed. Approximately 60% of the stone volume was removed. Through the peel-away sheath, a 14 [place] multipurpose drain was replaced. The wires were removed. Injection of contrast demonstrated patency of the peripheral cystic duct with limited filling defects in the gallbladder infundibulum. The drain was sutured to the skin with a 0 silk interrupted suture and a sterile bandage was applied. The drain was placed to bag drainage. The patient tolerated the procedure well was transferred to the recovery area in good condition. IMPRESSION: 1. Technically successful percutaneous cholangioscopic assisted gallstone retrieval removing approximately 60% of the indwelling gallstones. 2. Technically successful percutaneous cholecystostomy tube exchange in upsized to 14 [place]. PLAN: Keep cholecystostomy tube to bag drainage. IR will arrange for 1 month repeat percutaneous cost and retrieval in hopes to remove the additional gallstones. Marliss Coots, MD [facility] Radiology Electronically Signed   By: Marliss Coots M.D.   On: 12/30/2022 09:25    Labs:  CBC: Recent Labs    11/07/22 0333 11/08/22 0014 12/29/22 0925 01/26/23 0728  WBC 7.0 7.5 7.6 10.3  HGB 11.9* 11.9* 13.9 13.6  HCT 38.2 38.9 44.7 43.3  PLT 290 281 300 283    COAGS: Recent Labs    10/19/22 1123 10/20/22 0225 10/20/22 2145 10/21/22 0207 10/21/22 0640 10/22/22 0207 11/01/22 2222 12/29/22 1015 01/26/23 0758  INR 2.0*  --   --   --   --   --  1.5* 1.0 1.0  APTT  --    < > 89* 157* 70* 69*  --   --   --    < > = values in this interval not displayed.    BMP: Recent Labs    11/04/22 0003 11/05/22 0011 11/06/22 0139 11/07/22 0333  NA 139 140 142 137  K 3.9 4.2 3.7 3.8  CL 104 105 101 103  CO2 25 22 27 26   GLUCOSE 101* 87 108* 99  BUN 17 14 10 10   CALCIUM 8.7* 9.2 9.2 8.8*  CREATININE 1.04* 1.18* 0.83 0.90  GFRNONAA 54* 46* >60 >60    LIVER FUNCTION  TESTS: Recent Labs    11/04/22 0003 11/05/22 0011 11/06/22 0139 11/07/22 0333  BILITOT 0.7 0.5 0.5 0.8  AST 93* 40 22 20  ALT 96* 64* 47* 34  ALKPHOS 153* 138* 116 100  PROT 6.0* 5.8* 5.9* 5.9*  ALBUMIN 2.9* 2.8* 2.9* 2.9*    TUMOR MARKERS: No results for input(s): "AFPTM", "CEA", "CA199", "CHROMGRNA" in the last 8760 hours.  Assessment and Plan:  Scheduled today for additional Spyglass procedure and Biliary drain evaluation Pt is aware of procedure benefits and risks including but not limited to Infection; bleeding; damage to surrounding structures Agreeable to proceed Consent signed and in chart  Thank you for this interesting consult.  I greatly enjoyed meeting Jarielis Marley and look forward to participating in their care.  A copy of this report was sent to the requesting provider on this date.  Electronically Signed: Robet Leu, PA-C 01/26/2023, 8:34 AM   I spent a total of    25 Minutes in face to face in clinical consultation, greater than 50% of which was counseling/coordinating care for Spyglass procedure

## 2023-01-26 NOTE — Procedures (Signed)
Interventional Radiology Procedure Note  Procedure:  1) Cholecystostomy tube check 2) Percutaneous cholecystogram  Findings: Please refer to procedural dictation for full description.  Initial images demonstrated migration of indwelling cholecystotomy to extra-luminal position alongside the gallbladder, confirmed with cone beam CT.  18 ga trocar introduced via ultrasound guidance through prior track into gallbladder.  Cholecystogram demonstrates cholelithiasis with widely patent cystic and common bile ducts.  Findings discussed with the patient's sons, who at this time elect for leaving the drain out given tolerance.  Complications: None immediate  Estimated Blood Loss: < 5 mL  Recommendations: Follow up with Interventional Radiology as needed.  Discussed low threshold for presenting to the ED/PCP for evaluation if symptoms develop suggestive of recurrent calculous cholecystitis.   Marliss Coots, MD

## 2023-01-27 ENCOUNTER — Other Ambulatory Visit (HOSPITAL_COMMUNITY): Payer: Self-pay | Admitting: Interventional Radiology

## 2023-01-27 ENCOUNTER — Encounter (HOSPITAL_COMMUNITY): Payer: Self-pay

## 2023-01-27 DIAGNOSIS — K8012 Calculus of gallbladder with acute and chronic cholecystitis without obstruction: Secondary | ICD-10-CM | POA: Diagnosis not present

## 2023-01-27 DIAGNOSIS — K869 Disease of pancreas, unspecified: Secondary | ICD-10-CM | POA: Diagnosis not present

## 2023-01-27 DIAGNOSIS — I5032 Chronic diastolic (congestive) heart failure: Secondary | ICD-10-CM | POA: Diagnosis not present

## 2023-01-27 DIAGNOSIS — C50511 Malignant neoplasm of lower-outer quadrant of right female breast: Secondary | ICD-10-CM | POA: Diagnosis not present

## 2023-01-27 DIAGNOSIS — K8000 Calculus of gallbladder with acute cholecystitis without obstruction: Secondary | ICD-10-CM

## 2023-01-27 DIAGNOSIS — I480 Paroxysmal atrial fibrillation: Secondary | ICD-10-CM | POA: Diagnosis not present

## 2023-01-27 DIAGNOSIS — Z48815 Encounter for surgical aftercare following surgery on the digestive system: Secondary | ICD-10-CM | POA: Diagnosis not present

## 2023-01-28 ENCOUNTER — Ambulatory Visit: Payer: Medicare Other | Admitting: Hematology and Oncology

## 2023-02-02 DIAGNOSIS — I5032 Chronic diastolic (congestive) heart failure: Secondary | ICD-10-CM | POA: Diagnosis not present

## 2023-02-02 DIAGNOSIS — Z48815 Encounter for surgical aftercare following surgery on the digestive system: Secondary | ICD-10-CM | POA: Diagnosis not present

## 2023-02-02 DIAGNOSIS — K8012 Calculus of gallbladder with acute and chronic cholecystitis without obstruction: Secondary | ICD-10-CM | POA: Diagnosis not present

## 2023-02-02 DIAGNOSIS — I480 Paroxysmal atrial fibrillation: Secondary | ICD-10-CM | POA: Diagnosis not present

## 2023-02-02 DIAGNOSIS — K869 Disease of pancreas, unspecified: Secondary | ICD-10-CM | POA: Diagnosis not present

## 2023-02-02 DIAGNOSIS — C50511 Malignant neoplasm of lower-outer quadrant of right female breast: Secondary | ICD-10-CM | POA: Diagnosis not present

## 2023-02-09 DIAGNOSIS — I480 Paroxysmal atrial fibrillation: Secondary | ICD-10-CM | POA: Diagnosis not present

## 2023-02-09 DIAGNOSIS — K8012 Calculus of gallbladder with acute and chronic cholecystitis without obstruction: Secondary | ICD-10-CM | POA: Diagnosis not present

## 2023-02-09 DIAGNOSIS — K869 Disease of pancreas, unspecified: Secondary | ICD-10-CM | POA: Diagnosis not present

## 2023-02-09 DIAGNOSIS — I5032 Chronic diastolic (congestive) heart failure: Secondary | ICD-10-CM | POA: Diagnosis not present

## 2023-02-09 DIAGNOSIS — C50511 Malignant neoplasm of lower-outer quadrant of right female breast: Secondary | ICD-10-CM | POA: Diagnosis not present

## 2023-02-09 DIAGNOSIS — Z48815 Encounter for surgical aftercare following surgery on the digestive system: Secondary | ICD-10-CM | POA: Diagnosis not present

## 2023-02-14 ENCOUNTER — Ambulatory Visit: Payer: Self-pay

## 2023-02-14 ENCOUNTER — Telehealth: Payer: Self-pay

## 2023-02-14 NOTE — Patient Outreach (Signed)
Care Coordination   Follow Up Visit Note   02/14/2023 Name: Laura Mendez MRN: [id number] DOB: [birth year]  Laura Mendez is a 82 y.o. year old female who sees [name], MD for primary care. I spoke with  Laura Mendez by phone today.  What matters to the patients health and wellness today?  Received return phone call from Laura Mendez(dpr). He reports Laura Mendez is doing well. He states the tube has been taking out, patient is eating well. She has all her medications. Laura Mendez denies any questions or concerns. No care management needs noted at this time.   Goals Addressed             This Visit's Progress    COMPLETED: continue to improve post hospitalization       Interventions Today    Flowsheet Row Most Recent Value  Chronic Disease   Chronic disease during today's visit Other  General Interventions   General Interventions Discussed/Reviewed General Interventions Reviewed, Doctor Visits  [reviewed care management services with patient's son]  Doctor Visits Discussed/Reviewed Doctor Visits Reviewed, PCP, Specialist  PCP/Specialist Visits Compliance with follow-up visit  [reviewed upcoming/scheduled appointments]  Exercise Interventions   Exercise Discussed/Reviewed Physical Activity  [reports ambulating with walker]  Education Interventions   Education Provided Provided Education  Provided Verbal Education On When to see the doctor, Medication  [advised to continue to take medications as scheduled, attend provider visits as scheduled, encouraged to contact provider with health questions or concern.]  Nutrition Interventions   Nutrition Discussed/Reviewed Nutrition Reviewed  Pharmacy Interventions   Pharmacy Dicussed/Reviewed Pharmacy Topics Reviewed             SDOH assessments and interventions completed:  No  Care Coordination Interventions:  Yes, provided   Follow up plan: No further intervention required.   Encounter Outcome:  Patient  Visit Completed   Laura Sheriff, RN, MSN, BSN, CCM Care Management Coordinator [phone]

## 2023-02-14 NOTE — Patient Instructions (Addendum)
Visit Information  Thank you for taking time to visit with me today. Please don't hesitate to contact me if I can be of assistance to you.   Following are the goals we discussed today:  Continue to take medications as prescribed. Continue to attend provider visits as scheduled Contact provider with any health questions or concerns   If you are experiencing a Mental Health or Behavioral Health Crisis or need someone to talk to, please call the Suicide and Crisis Lifeline: 988 call the [place] National Suicide Prevention Lifeline: (580)446-2345 or TTY: 919-384-3564 TTY (226) 755-6943) to talk to a trained counselor call 1-800-273-TALK (toll free, 24 hour hotline)  Kathyrn Sheriff, RN, MSN, BSN, CCM Care Management Coordinator [phone]

## 2023-02-14 NOTE — Patient Outreach (Signed)
Care Coordination   02/14/2023 Name: Prescious Sucher MRN: [id number] DOB: May 02, 1941   Care Coordination Outreach Attempts:  An unsuccessful telephone outreach was attempted for a scheduled appointment today.  Follow Up Plan:  Additional outreach attempts will be made to offer the patient care coordination information and services.   Encounter Outcome:  No Answer   Care Coordination Interventions:  No, not indicated    Kathyrn Sheriff, RN, MSN, BSN, CCM Care Management Coordinator [phone]

## 2023-02-16 DIAGNOSIS — I480 Paroxysmal atrial fibrillation: Secondary | ICD-10-CM | POA: Diagnosis not present

## 2023-02-16 DIAGNOSIS — Z48815 Encounter for surgical aftercare following surgery on the digestive system: Secondary | ICD-10-CM | POA: Diagnosis not present

## 2023-02-16 DIAGNOSIS — K869 Disease of pancreas, unspecified: Secondary | ICD-10-CM | POA: Diagnosis not present

## 2023-02-16 DIAGNOSIS — C50511 Malignant neoplasm of lower-outer quadrant of right female breast: Secondary | ICD-10-CM | POA: Diagnosis not present

## 2023-02-16 DIAGNOSIS — K8012 Calculus of gallbladder with acute and chronic cholecystitis without obstruction: Secondary | ICD-10-CM | POA: Diagnosis not present

## 2023-02-16 DIAGNOSIS — I5032 Chronic diastolic (congestive) heart failure: Secondary | ICD-10-CM | POA: Diagnosis not present

## 2023-02-17 ENCOUNTER — Other Ambulatory Visit: Payer: Self-pay | Admitting: Cardiology

## 2023-02-17 DIAGNOSIS — I480 Paroxysmal atrial fibrillation: Secondary | ICD-10-CM

## 2023-02-17 NOTE — Telephone Encounter (Signed)
Prescription refill request for Eliquis received. Indication: Afib  Last office visit: 01/13/23 Louanne Skye)  Scr: 0.90 (11/07/22)  Age: 82 Weight: 88.9kg  Appropriate dose. Refill sent.

## 2023-02-18 ENCOUNTER — Ambulatory Visit: Payer: Medicare Other | Admitting: Hematology and Oncology

## 2023-02-25 ENCOUNTER — Ambulatory Visit (INDEPENDENT_AMBULATORY_CARE_PROVIDER_SITE_OTHER): Payer: Medicare Other | Admitting: Nurse Practitioner

## 2023-02-25 ENCOUNTER — Ambulatory Visit: Payer: Medicare Other | Admitting: *Deleted

## 2023-02-25 VITALS — BP 134/78 | HR 65 | Temp 98.1°F | Ht 70.0 in | Wt 198.0 lb

## 2023-02-25 DIAGNOSIS — Z0000 Encounter for general adult medical examination without abnormal findings: Secondary | ICD-10-CM

## 2023-02-25 DIAGNOSIS — H6122 Impacted cerumen, left ear: Secondary | ICD-10-CM | POA: Diagnosis not present

## 2023-02-25 NOTE — Progress Notes (Unsigned)
NEW PATIENT Date of Service/Encounter:  02/28/23 Referring provider: Sharlene Dory, PA-C Primary care provider: [name], MD  Subjective:  Laura Mendez is a 82 y.o. female with a PMHx of atrial fibrillation/flutter and TIA presenting today for evaluation of asthma/COPD, chronic rhinitis, atopic dermatitis. History obtained from: chart review and patient and son .   Discussed the use of AI scribe software for clinical note transcription with the patient, who gave verbal consent to proceed.  History of Present Illness   The patient, accompanied by her son, presents with a longstanding history of pruritus and what they are calling hives, which have been particularly bothersome over the past week. The patient has been previously managed with Dupixent, which they believe was prescribed by an allergist at [facility]. The patient self-administered the Dupixent injections, which were delivered to her home. The patient reports that while on Dupixent, the itching was not as severe, but the "hives" persisted.  In addition to the skin issues, the patient had a recent hospitalization due to gallstones. The patient was too ill for surgery at the time, so a tube was inserted for drainage. After about four months, a spyglass procedure was performed to remove the stones, and the tube was removed approximately a month ago. The patient has been doing well since the procedure.  However, for unclear reasons the Dupixent was paused during this time.  The patient has sought care at multiple facilities over the years, including [facility], in an attempt to identify the cause of her skin issues. Often, she was given a steroid shot, which seemed to provide temporary relief. The patient's son expressed interest in a more permanent solution to the patient's skin issues.  The patient's son also mentioned that the patient has memory problems following a stroke last year. The patient confirmed  this during the consultation.     Additionally, she is followed previously by a lung doctor for her asthma which she states was diagnosed as an adult.  She was initially hospitalized at diagnosis, but no hospitalizations since.  She has not required any prednisone in the past year for an asthma exacerbation.  She takes Advair 251 puff twice a day and uses her albuterol infrequently.  Her son is not sure who has been managing this for her.  She does report that her symptoms are worse seasonally during spring and fall.  She has had allergy testing in the remote past many years ago at [facility].  She reports being on allergy injections for several years.  She has not had testing since this occurred.  Discussed that her initial referral reported asthma as cause of referral.  After the patient left, extensive review of her chart indicated that she is followed currently by [facility]-her last visit was with Dr. Maple Hudson earlier this year.  She has both COPD and asthma.  She also has been following with [facility] dermatology for many years.  She was last seen earlier this year as well, and was on Dupixent for a rash that was suspected to be atopic dermatitis.   Chart Review:  Reviewed PCP notes from referral 11/15/22: Multiple health issues discussed, but none related to allergy Chest CT: 11/02/22:  Lungs/Pleura: Emphysema. Bronchial wall thickening in the lower lobes with scattered mucous plugs. Clustered micro nodules in the left lower lobe. 5 mm subpleural nodule in the left lower lobe (5/92) stable since 2008 and benign. No follow-up recommended. 11/01/22: AEC 400 Last visit Pulmonary 07/29/22  with Dr. Maple Hudson.  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 Fib Labs 11/23/16- allergy profile total IgE 409 on Xolair, elevated specific IgE for dust mite and cockroach. EOS 3,300, Nl ANA and alpha-gal Derm Path report reviewed-  Spongiotic/ eczema process- does not describe it as urticaria. Xolair was changed to [facility] June, 2018, to see if Xolair was causing her rash-no improvement after 5 months Office Spirometry 12/20/2017-very severe obstructive airways disease.  FVC 2.09/60%, FEV1 0.90/34%, ratio 0.43 Quantiferon tb Gold 06/22/17- Neg" Last visit Dermatology 07/06/22: "atopic dermatitis, currently flaring. Previously followed by [facility]. Prior biopsy shoed changes maybe consistent with psoriasis. She has tried MTX 10 mg weekly, Dupixent, Xolair, Tremfya (no improvement), topical steroids, atarax, and Cellcept. She has asthma since 1979, prick allergy testing at [facility] (roaches and dust mites). No patch testing. ANA and RPR negative, Daughter with Lupus. 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 She states the only thing that helps is prednisone. Last dose of Dupixent was about 3 weeks ago. "    Past Medical History: Past Medical History:  Diagnosis Date   Asthma    Breast cancer (HCC)    Chronic airway obstruction, not elsewhere classified    Diastolic dysfunction without heart failure    Disorder of bone and cartilage, unspecified    Dysrhythmia    A-Fib   Family history of colon cancer    Family history of kidney cancer    Family history of ovarian cancer    Paroxysmal atrial flutter (HCC)    Stroke (HCC)    TIA   TIA (transient ischemic attack)    Unspecified asthma(493.90)    Urticaria    Medication List:  Current Outpatient Medications  Medication Sig Dispense Refill   albuterol (VENTOLIN HFA) 108 (90 Base) MCG/ACT inhaler INHALE 2 PUFFS BY MOUTH EVERY 4 HOURS AS NEEDED (Patient taking differently: Inhale 2 puffs into the lungs every 4 (four) hours as needed for wheezing or shortness of breath.) 18 g 3   ammonium lactate (LAC-HYDRIN) 12 % cream Apply 1 Application topically as needed for dry skin. 90 g 1   apixaban (ELIQUIS) 5 MG TABS tablet Take 1 tablet by mouth twice daily  180 tablet 1   fluticasone-salmeterol (ADVAIR) 250-50 MCG/ACT AEPB INHALE 1 PUFF INTO LUNGS TWICE DAILY 60 each 11   letrozole (FEMARA) 2.5 MG tablet Take 1 tablet (2.5 mg total) by mouth daily. 90 tablet 0   Multiple Vitamin (MULTIVITAMIN) tablet Take 1 tablet by mouth daily.     predniSONE (DELTASONE) 10 MG tablet 1 daily or as directed 50 tablet 3   triamcinolone cream (KENALOG) 0.1 % Apply 1 Application topically 2 (two) times daily. 30 g 0   amiodarone (PACERONE) 200 MG tablet Take 2 tablets (400 mg total) by mouth 2 (two) times daily for 5 days, THEN 1 tablet (200 mg total) daily for 21 days. Stop amiodarone after taking 200mg  daily for 21 days.. 41 tablet 0   DUPIXENT 300 MG/2ML SOPN Inject 300 mg into the skin every 14 (fourteen) days. (Patient not taking: Reported on 02/28/2023)     pantoprazole (PROTONIX) 40 MG tablet Take 1 tablet (40 mg total) by mouth daily. (Patient not taking: Reported on 02/28/2023) 90 tablet 0   Current Facility-Administered Medications  Medication Dose Route Frequency Provider Last Rate Last Admin   Mepolizumab SOLR 100 mg  100 mg Subcutaneous Q28 days Young, Clinton D,  MD   100 mg at 12/20/17 1455   Known Allergies:  Allergies  Allergen Reactions   Clarithromycin Anaphylaxis    "just about killed me"   Aspirin Hives   Ibuprofen Hives   Codeine Nausea And Vomiting   Diltiazem Rash   Methotrexate Other (See Comments)     tongue swelling and mouth soreness   Tape Itching and Other (See Comments)    Tears skin   Tapentadol Itching   Verapamil Hcl Er Rash   Past Surgical History: Past Surgical History:  Procedure Laterality Date   ABDOMINAL HYSTERECTOMY     BREAST LUMPECTOMY Right 12/30/2017   BREAST LUMPECTOMY WITH RADIOACTIVE SEED LOCALIZATION Right 12/30/2017   Procedure: BREAST LUMPECTOMY WITH RADIOACTIVE SEED LOCALIZATION X'S 3;  Surgeon: Emelia Loron, MD;  Location: [facility] OR;  Service: General;  Laterality: Right;   BUNIONECTOMY Bilateral     EYE SURGERY Bilateral    cataract removal   IR CHOLANGIOGRAM EXISTING TUBE  01/26/2023   IR EXCHANGE BILIARY DRAIN  11/02/2022   IR EXCHANGE BILIARY DRAIN  12/29/2022   IR PERC CHOLECYSTOSTOMY  10/21/2022   IR RADIOLOGIST EVAL & MGMT  12/13/2022   IR REMOVAL BILIARY DRAIN  01/26/2023   IR REMOVAL OF CALCULI/DEBRIS BILIARY DUCT/GB  12/29/2022   IR SINUS/FIST TUBE CHK-NON GI  12/02/2022   IR US GUIDE VASC ACCESS LEFT  01/26/2023   LEFT HEART CATH AND CORONARY ANGIOGRAPHY N/A 11/23/2021   Procedure: LEFT HEART CATH AND CORONARY ANGIOGRAPHY;  Surgeon: Tonny Bollman, MD;  Location: [facility] INVASIVE CV LAB;  Service: Cardiovascular;  Laterality: N/A;   Family History: Family History  Problem Relation Age of Onset   COPD Father    Colon cancer Mother 47       mets to pancreas and liver   Kidney cancer Sister 108       d. 55   Lung cancer Sister        d. 86, smoker   Ovarian cancer Sister 26       d. 72   Cirrhosis Sister        d. 30   Melanoma Sister 24   Ovarian cancer Maternal Grandmother        d. 61   Heart disease Maternal Grandfather        rheumatic heart disease   Rectal cancer Paternal Grandfather        d. 47   Parkinson's disease Brother    Ovarian cancer Maternal Aunt    Colon cancer Maternal Uncle        d. 72-73   Heart attack Paternal Uncle        d. 38   Kidney cancer Brother 49   Colon cancer Maternal Uncle        d. 44   Cancer Other        MGMs pat 1/2 sister with uterine and rectal cancer   Colon cancer Cousin        d. 100 - mat first cousin   Breast cancer Neg Hx    Social History: Analucia lives in a townhouse without water damage, carpet in the bedroom, electric heating, central AC, no pets, no roaches, not using dust mite covers on the bed or pillows, no smoke exposure.  She is retired.  No HEPA filter in the home.  [place]   ROS:  All other systems negative except as noted per HPI.  Objective:  Blood pressure 136/70, pulse 64,  temperature  97.6 F (36.4 C), temperature source Temporal, height 5\' 10"  (1.778 m), weight 199 lb (90.3 kg), SpO2 95%. Body mass index is 28.55 kg/m. Physical Exam:  General Appearance:  Alert, cooperative, no distress, appears stated age  Head:  Normocephalic, without obvious abnormality, atraumatic  Eyes:  Conjunctiva clear, EOM's intact  Nose: Nares normal,  no visible rhinorrhea  Throat: Lips, tongue normal; teeth and gums normal,  moist mucous membranes  Neck: Supple, symmetrical  Lungs:   clear to auscultation bilaterally, Respirations unlabored, no coughing  Heart:  regular rate and rhythm and no murmur, Appears well perfused  Extremities: No edema  Skin: Dry skin with multiple seborrheic keratosis scattered on entire abdomen some of which are red with overlying excoriations, patch of erythema on bilateral thighs with petechiae  Neurologic: No gross deficits      Diagnostics: Spirometry:  Tracings reviewed. Her effort: Good reproducible efforts. FVC: 2.17L (pre), 2.11L  (post) FEV1: 0.83L, 36% predicted (pre), 0.90L, 39% predicted (post) FEV1/FVC ratio: 0.38 (pre), 0.43 (post) Interpretation: Spirometry consistent with mixed obstructive and restrictive disease.  Without significant improvement following bronchodilator Please see scanned spirometry results for details.  Labs:  Lab Orders  No laboratory test(s) ordered today     Assessment and Plan  Assessment and Plan       Chronic Itching and Rash Patient has a history of chronic itching and rash, previously treated with Dupixent. Current rash does not appear to be hives or eczema, but rather seborrheic keratosis and petechiae on thighs from scratching -Refer to dermatology for further evaluation and management. -Discussed moisturizing skin to help with itching (see skin care below) -Attempt to obtain records from [facility] for further information. -Administer 40mg  of Depo injection today to help with  itching-discussed the adverse effects of chronic steroid use, and that this is not a great long term option. - can take zyrtec 10 mg up to twice daily as needed for itching  Mild Persistent Asthma/COPD: - Controller Inhaler: Continue Advair 250 mcg 1 puff twice a day; This Should Be Used Everyday - Rinse mouth out after use - During respiratory illness or asthma flares: Increase Advair  2 puffs twice daily  and continue for 2 weeks or until symptoms resolve. - Rescue Inhaler: Albuterol (Proair/Ventolin) 2 puffs . Use  every 4-6 hours as needed for chest tightness, wheezing, or coughing.   Can also use 15 minutes prior to exercise if you have symptoms with activity. - Asthma is not controlled if:  - Symptoms are occurring >2 times a week OR  - >2 times a month nighttime awakenings  - You are requiring systemic steroids (prednisone/steroid injections) more than once per year  - Your require hospitalization for your asthma.  - Please call the clinic to schedule a follow up if these symptoms arise Avoid smoke exposure Stay up-to-date with your annual flu vaccines, COVID vaccines and pneumonia vaccines when indicated. - consider environmental allergy testing via blood work  Follow up : as needed It was a pleasure meeting you and your family in clinic today! Thank you for allowing me to participate in your care.  Addendum: After patient left her previous records from dermatology and pulmonary were found from earlier this year.  Attempt was made to call patient's son to indicate that she is already being followed for the issues discussed today and likely has a valid prior authorization through dermatology for Dupixent if they would like to continue those treatments.  She also has a current  therapeutic plan ongoing with pulmonary with a mostly COPD like picture.  We are happy to continue following her but it appears that she already has excellent care in place at this time.   This note in its  entirety was forwarded to the Provider who requested this consultation.  Other: none  Thank you for your kind referral. I appreciate the opportunity to take part in Adelin's care. Please do not hesitate to contact me with questions.  Sincerely,  Tonny Bollman, MD [facility]

## 2023-02-25 NOTE — Progress Notes (Signed)
Established Patient Office Visit  Subjective   Patient ID: Laura Mendez, female    DOB: 04/06/1941  Age: 82 y.o. MRN: [id number]  Chief Complaint  Patient presents with   Hearing Loss    Patient arrives today accompanied by her son for the above. Reports her left ear feels "stopped up" and has noticed decreased hearing. No pain, drainage, fever.     Review of Systems  Constitutional:  Negative for chills and fever.  HENT:  Positive for hearing loss. Negative for ear discharge, ear pain and tinnitus.       Objective:     BP 134/78   Pulse 65   Temp 98.1 F (36.7 C) (Oral)   Ht 5\' 10"  (1.778 m)   Wt 198 lb (89.8 kg)   SpO2 95%   BMI 28.41 kg/m    Physical Exam Vitals reviewed.  Constitutional:      General: She is not in acute distress.    Appearance: Normal appearance.  HENT:     Head: Normocephalic and atraumatic.     Right Ear: Hearing, tympanic membrane, ear canal and external ear normal.     Left Ear: Hearing, ear canal and external ear normal. There is impacted cerumen.  Neck:     Vascular: No carotid bruit.  Pulmonary:     Effort: Pulmonary effort is normal.  Skin:    General: Skin is warm and dry.  Neurological:     General: No focal deficit present.     Mental Status: She is alert and oriented to person, place, and time.  Psychiatric:        Mood and Affect: Mood normal.        Behavior: Behavior normal.        Judgment: Judgment normal.      No results found for any visits on 02/25/23.    The ASCVD Risk score (Arnett DK, et al., 2019) failed to calculate for the following reasons:   The 2019 ASCVD risk score is only valid for ages 22 to 29   The patient has a prior MI or stroke diagnosis    Assessment & Plan:   Problem List Items Addressed This Visit       Nervous and Auditory   Impacted cerumen of left ear - Primary    Acute Ear lavage performed, patient tolerated procedure well.  S/p lavage: tympanic membrane  visualized and appears WNL. Patient reports hearing has also improved.  Patient encouraged to avoid Q-tips and to either try over the counter debrox drops or return to clinic if symptoms return.  Flu shot offered, patient declined.        Return if symptoms worsen or fail to improve.    Elenore Paddy, NP

## 2023-02-25 NOTE — Patient Instructions (Signed)

## 2023-02-25 NOTE — Assessment & Plan Note (Addendum)
Acute Ear lavage performed, patient tolerated procedure well.  S/p lavage: tympanic membrane visualized and appears WNL. Patient reports hearing has also improved.  Patient encouraged to avoid Q-tips and to either try over the counter debrox drops or return to clinic if symptoms return.  Flu shot offered, patient declined.

## 2023-02-25 NOTE — Progress Notes (Signed)
Subjective:   Laura Mendez is a 82 y.o. female who presents for Medicare Annual (Subsequent) preventive examination.  Visit Complete: In person  Patient Medicare AWV questionnaire was completed by the patient on 02/25/2023; I have confirmed that all information answered by patient is correct and no changes since this date.  Cardiac Risk Factors include: advanced age (>73men, >58 women)    Please see problem list for additional risk factors Objective:    Today's Vitals   02/25/23 0814  BP: 134/78  Pulse: 65  Temp: 98.1 F (36.7 C)  TempSrc: Oral  SpO2: 95%  Weight: 198 lb (89.8 kg)  Height: 5\' 10"  (1.778 m)   Body mass index is 28.41 kg/m.     02/25/2023    8:33 AM 12/29/2022    9:37 AM 11/02/2022   12:42 PM 10/19/2022    5:03 PM 11/23/2021    7:02 PM 11/23/2021    2:00 AM 11/23/2021   12:03 AM  Advanced Directives  Does Patient Have a Medical Advance Directive? Yes Yes Yes Yes  No No  Type of Advance Directive Living will;Healthcare Power of Attorney Living will;Healthcare Power of [employer] Power of [place];Living will Healthcare Power of [place];Living will  Healthcare Power of Attorney   Does patient want to make changes to medical advance directive?  No - Patient declined No - Patient declined No - Patient declined  No - Patient declined   Copy of Healthcare Power of Attorney in Chart?  No - copy requested No - copy requested No - copy requested  No - copy requested   Would patient like information on creating a medical advance directive?     No - Patient declined      Current Medications (verified) Outpatient Encounter Medications as of 02/25/2023  Medication Sig   albuterol (VENTOLIN HFA) 108 (90 Base) MCG/ACT inhaler INHALE 2 PUFFS BY MOUTH EVERY 4 HOURS AS NEEDED (Patient taking differently: Inhale 2 puffs into the lungs every 4 (four) hours as needed for wheezing or shortness of breath.)   ammonium lactate (LAC-HYDRIN) 12 % cream Apply 1  Application topically as needed for dry skin.   apixaban (ELIQUIS) 5 MG TABS tablet Take 1 tablet by mouth twice daily   DUPIXENT 300 MG/2ML SOPN Inject 300 mg into the skin every 14 (fourteen) days.   fluticasone-salmeterol (ADVAIR) 250-50 MCG/ACT AEPB INHALE 1 PUFF INTO LUNGS TWICE DAILY   letrozole (FEMARA) 2.5 MG tablet Take 1 tablet (2.5 mg total) by mouth daily.   Multiple Vitamin (MULTIVITAMIN) tablet Take 1 tablet by mouth daily.   pantoprazole (PROTONIX) 40 MG tablet Take 1 tablet (40 mg total) by mouth daily.   predniSONE (DELTASONE) 10 MG tablet 1 daily or as directed   triamcinolone cream (KENALOG) 0.1 % Apply 1 Application topically 2 (two) times daily.   amiodarone (PACERONE) 200 MG tablet Take 2 tablets (400 mg total) by mouth 2 (two) times daily for 5 days, THEN 1 tablet (200 mg total) daily for 21 days. Stop amiodarone after taking 200mg  daily for 21 days..   [DISCONTINUED] nitrofurantoin, macrocrystal-monohydrate, (MACROBID) 100 MG capsule Take 1 capsule (100 mg total) by mouth 2 (two) times daily.   [DISCONTINUED] Sodium Chloride Flush (NORMAL SALINE FLUSH) 0.9 % SOLN Flush drain once daily with 5ml of saline   Facility-Administered Encounter Medications as of 02/25/2023  Medication   Mepolizumab SOLR 100 mg    Allergies (verified) Clarithromycin, Aspirin, Ibuprofen, Codeine, Diltiazem, Methotrexate, Tape, Tapentadol, and Verapamil hcl er  History: Past Medical History:  Diagnosis Date   Asthma    Breast cancer (HCC)    Chronic airway obstruction, not elsewhere classified    Diastolic dysfunction without heart failure    Disorder of bone and cartilage, unspecified    Dysrhythmia    A-Fib   Family history of colon cancer    Family history of kidney cancer    Family history of ovarian cancer    Paroxysmal atrial flutter (HCC)    Stroke (HCC)    TIA   TIA (transient ischemic attack)    Unspecified asthma(493.90)    Past Surgical History:  Procedure Laterality  Date   ABDOMINAL HYSTERECTOMY     BREAST LUMPECTOMY Right 12/30/2017   BREAST LUMPECTOMY WITH RADIOACTIVE SEED LOCALIZATION Right 12/30/2017   Procedure: BREAST LUMPECTOMY WITH RADIOACTIVE SEED LOCALIZATION X'S 3;  Surgeon: Emelia Loron, MD;  Location: [facility] OR;  Service: General;  Laterality: Right;   BUNIONECTOMY Bilateral    EYE SURGERY Bilateral    cataract removal   IR CHOLANGIOGRAM EXISTING TUBE  01/26/2023   IR EXCHANGE BILIARY DRAIN  11/02/2022   IR EXCHANGE BILIARY DRAIN  12/29/2022   IR PERC CHOLECYSTOSTOMY  10/21/2022   IR RADIOLOGIST EVAL & MGMT  12/13/2022   IR REMOVAL BILIARY DRAIN  01/26/2023   IR REMOVAL OF CALCULI/DEBRIS BILIARY DUCT/GB  12/29/2022   IR SINUS/FIST TUBE CHK-NON GI  12/02/2022   IR US GUIDE VASC ACCESS LEFT  01/26/2023   LEFT HEART CATH AND CORONARY ANGIOGRAPHY N/A 11/23/2021   Procedure: LEFT HEART CATH AND CORONARY ANGIOGRAPHY;  Surgeon: Tonny Bollman, MD;  Location: [facility] INVASIVE CV LAB;  Service: Cardiovascular;  Laterality: N/A;   Family History  Problem Relation Age of Onset   COPD Father    Colon cancer Mother 46       mets to pancreas and liver   Kidney cancer Sister 79       d. 39   Lung cancer Sister        d. 42, smoker   Ovarian cancer Sister 108       d. 6   Cirrhosis Sister        d. 71   Melanoma Sister 83   Ovarian cancer Maternal Grandmother        d. 78   Heart disease Maternal Grandfather        rheumatic heart disease   Rectal cancer Paternal Grandfather        d. 8   Parkinson's disease Brother    Ovarian cancer Maternal Aunt    Colon cancer Maternal Uncle        d. 72-73   Heart attack Paternal Uncle        d. 66   Kidney cancer Brother 78   Colon cancer Maternal Uncle        d. 5   Cancer Other        MGMs pat 1/2 sister with uterine and rectal cancer   Colon cancer Cousin        d. 40 - mat first cousin   Breast cancer Neg Hx    Social History   Socioeconomic History   Marital status: Divorced    Spouse name: Not on  file   Number of children: Not on file   Years of education: Not on file   Highest education level: Not on file  Occupational History   Occupation: retired  Tobacco Use   Smoking status: Former    Current  packs/day: 0.00    Types: Cigarettes    Start date: 05/24/1977    Quit date: 05/24/1977    Years since quitting: 45.7   Smokeless tobacco: Never   Tobacco comments:    7/7 pt states she occasionally smoked, let cigs "burn" more than she smoked them  Vaping Use   Vaping status: Never Used  Substance and Sexual Activity   Alcohol use: No   Drug use: No   Sexual activity: Not on file  Other Topics Concern   Not on file  Social History Narrative   Right handed   1 coke per day   Lives alone   Social Determinants of Health   Financial Resource Strain: Low Risk  (02/25/2023)   Overall Financial Resource Strain (CARDIA)    Difficulty of Paying Living Expenses: Not hard at all  Food Insecurity: No Food Insecurity (02/25/2023)   Hunger Vital Sign    Worried About Running Out of Food in the Last Year: Never true    Ran Out of Food in the Last Year: Never true  Transportation Needs: No Transportation Needs (02/25/2023)   PRAPARE - Administrator, Civil Service (Medical): No    Lack of Transportation (Non-Medical): No  Physical Activity: Inactive (02/25/2023)   Exercise Vital Sign    Days of Exercise per Week: 0 days    Minutes of Exercise per Session: 30 min  Stress: No Stress Concern Present (02/25/2023)   [employer] of Occupational Health - Occupational Stress Questionnaire    Feeling of Stress : Not at all  Social Connections: Socially Isolated (02/25/2023)   Social Connection and Isolation Panel [NHANES]    Frequency of Communication with Friends and Family: More than three times a week    Frequency of Social Gatherings with Friends and Family: Three times a week    Attends Religious Services: Never    Active Member of Clubs or Organizations: No    Attends  Banker Meetings: Never    Marital Status: Widowed    Tobacco Counseling Counseling given: Not Answered Tobacco comments: 7/7 pt states she occasionally smoked, let cigs "burn" more than she smoked them   Clinical Intake:  Pre-visit preparation completed: Yes  Pain : No/denies pain     Nutritional Status: BMI 25 -29 Overweight Nutritional Risks: None Diabetes: No  How often do you need to have someone help you when you read instructions, pamphlets, or other written materials from your doctor or pharmacy?: 1 - Never What is the last grade level you completed in school?: 12th grade  Interpreter Needed?: No      Activities of Daily Living    02/25/2023    8:24 AM 02/25/2023    7:27 AM  In your present state of health, do you have any difficulty performing the following activities:  Hearing? 0 0  Vision? 0 0  Difficulty concentrating or making decisions? 1 1  Walking or climbing stairs? 1 1  Dressing or bathing? 0 0  Doing errands, shopping? 0 0  Preparing Food and eating ? N N  Using the Toilet? N N  In the past six months, have you accidently leaked urine? N N  Do you have problems with loss of bowel control? N N  Managing your Medications? N N  Managing your Finances? N N  Housekeeping or managing your Housekeeping? N N    Patient Care Team: [name], MD as PCP - General (Internal Medicine) Jake Bathe, MD  as PCP - Cardiology (Cardiology) Lanier Prude, MD as PCP - Electrophysiology (Cardiology) Emelia Loron, MD as Consulting Physician (General Surgery) Serena Croissant, MD as Consulting Physician (Hematology and Oncology) Antony Blackbird, MD as Consulting Physician (Radiation Oncology)  Indicate any recent Medical Services you may have received from other than [facility] providers in the past year (date may be approximate).     Assessment:   This is a routine wellness examination for Laura Mendez.  Hearing/Vision screen Vision  Screening - Comments:: Annual eye exam . [place] [facility]    Goals Addressed   None   Depression Screen    02/25/2023    8:30 AM 02/25/2023    8:27 AM 02/25/2023    8:21 AM 12/08/2021   12:12 PM 11/16/2021    9:33 AM 11/16/2021    9:31 AM 09/08/2021    1:05 PM  PHQ 2/9 Scores  PHQ - 2 Score 0 0 0 0 0 0 0    Fall Risk    02/25/2023    8:25 AM 02/25/2023    8:21 AM 02/25/2023    7:27 AM 01/05/2023   12:01 PM 11/11/2022    8:49 AM  Fall Risk   Falls in the past year? 0 0 0 0 1  Number falls in past yr: 0 0   0  Injury with Fall? 0 0   1  Risk for fall due to : No Fall Risks No Fall Risks     Follow up Falls evaluation completed Falls evaluation completed   Falls evaluation completed    MEDICARE RISK AT HOME: Medicare Risk at Home Any stairs in or around the home?: Yes If so, are there any without handrails?: No Home free of loose throw rugs in walkways, pet beds, electrical cords, etc?: Yes Adequate lighting in your home to reduce risk of falls?: Yes Life alert?: No Use of a cane, walker or w/c?: Yes Grab bars in the bathroom?: Yes Shower chair or bench in shower?: Yes Elevated toilet seat or a handicapped toilet?: Yes  TIMED UP AND GO:  Was the test performed?  Yes  Length of time to ambulate 10 feet: 6 sec Gait slow and steady with assistive device    Cognitive Function:        02/25/2023    8:30 AM  6CIT Screen  What Year? 0 points  What month? 3 points  What time? 0 points  Count back from 20 2 points  Months in reverse 0 points  Repeat phrase 2 points  Total Score 7 points    Immunizations Immunization History  Administered Date(s) Administered   Fluad Quad(high Dose 65+) 02/06/2019, 04/08/2020, 03/09/2021   H1N1 04/24/2008   Influenza Split 02/03/2011, 02/18/2012, 02/09/2013, 03/17/2015   Influenza Whole 02/22/2007, 02/16/2008, 02/21/2009, 01/23/2010   Influenza, High Dose Seasonal PF 03/05/2016, 03/09/2017, 02/22/2018   Influenza-Unspecified  02/03/2011, 02/09/2013, 02/22/2014, 03/08/2022   PFIZER(Purple Top)SARS-COV-2 Vaccination 06/19/2019, 07/10/2019, 03/18/2020, 06/24/2021   Pneumococcal Conjugate-13 01/08/2015   Pneumococcal Polysaccharide-23 05/25/2003, 02/16/2017   Td 05/24/2004   Tdap 06/24/2015, 04/16/2021   Tetanus 05/24/2004   Zoster Recombinant(Shingrix) 11/04/2020   Zoster, Live 08/22/2011    TDAP status: Up to date  Flu Vaccine status: Declined, Education has been provided regarding the importance of this vaccine but patient still declined. Advised may receive this vaccine at local pharmacy or Health Dept. Aware to provide a copy of the vaccination record if obtained from local pharmacy or Health Dept. Verbalized acceptance and understanding.  Pneumococcal vaccine status:  Up to date  Covid-19 vaccine status: Completed vaccines  Qualifies for Shingles Vaccine? Yes   Zostavax completed No   Shingrix Completed?: No.    Education has been provided regarding the importance of this vaccine. Patient has been advised to call insurance company to determine out of pocket expense if they have not yet received this vaccine. Advised may also receive vaccine at local pharmacy or Health Dept. Verbalized acceptance and understanding.  Screening Tests Health Maintenance  Topic Date Due   COVID-19 Vaccine (5 - 2023-24 season) 01/23/2023   Zoster Vaccines- Shingrix (2 of 2) 05/28/2023 (Originally 12/30/2020)   INFLUENZA VACCINE  08/22/2023 (Originally 12/23/2022)   Medicare Annual Wellness (AWV)  02/25/2024   DTaP/Tdap/Td (5 - Td or Tdap) 04/17/2031   Pneumonia Vaccine 10+ Years old  Completed   DEXA SCAN  Completed   HPV VACCINES  Aged Out    Health Maintenance  Health Maintenance Due  Topic Date Due   COVID-19 Vaccine (5 - 2023-24 season) 01/23/2023    Colorectal cancer screening: No longer required.   Mammogram status: patient states she has an appointment next week  Bone Density status: Completed 08/30/2011.  Results reflect: Bone density results: OSTEOPENIA. Repeat every 2 years.  Lung Cancer Screening: (Low Dose CT Chest recommended if Age 71-80 years, 20 pack-year currently smoking OR have quit w/in 15years.) does not qualify.   Lung Cancer Screening Referral: n/a  Additional Screening:  Hepatitis C Screening: does qualify; Completed n/a  Vision Screening: Recommended annual ophthalmology exams for early detection of glaucoma and other disorders of the eye. Is the patient up to date with their annual eye exam?  Yes  Who is the provider or what is the name of the office in which the patient attends annual eye exams? [facility] [place] [facility]  If pt is not established with a provider, would they like to be referred to a provider to establish care? No .   Dental Screening: Recommended annual dental exams for proper oral hygiene  Diabetic Foot Exam: patient is not a diabetic  Community Resource Referral / Chronic Care Management: CRR required this visit?  No   CCM required this visit?  No     Plan:     I have personally reviewed and noted the following in the patient's chart:   Medical and social history Use of alcohol, tobacco or illicit drugs  Current medications and supplements including opioid prescriptions. Patient is not currently taking opioid prescriptions. Functional ability and status Nutritional status Physical activity Advanced directives List of other physicians Hospitalizations, surgeries, and ER visits in previous 12 months Vitals Screenings to include cognitive, depression, and falls Referrals and appointments  In addition, I have reviewed and discussed with patient certain preventive protocols, quality metrics, and best practice recommendations. A written personalized care plan for preventive services as well as general preventive health recommendations were provided to patient.     Tamela Oddi, CMA   02/25/2023    After Visit Summary: (In  Person-Printed) AVS printed and given to the patient  Nurse Notes:  [name] , Thank you for taking time to come for your Medicare Wellness Visit. I appreciate your ongoing commitment to your health goals. Please review the following plan we discussed and let me know if I can assist you in the future.   These are the goals we discussed:  Goals   None     This is a list of the screening recommended for you and due dates:  Health  Maintenance  Topic Date Due   COVID-19 Vaccine (5 - 2023-24 season) 01/23/2023   Zoster (Shingles) Vaccine (2 of 2) 05/28/2023*   Flu Shot  08/22/2023*   Medicare Annual Wellness Visit  02/25/2024   DTaP/Tdap/Td vaccine (5 - Td or Tdap) 04/17/2031   Pneumonia Vaccine  Completed   DEXA scan (bone density measurement)  Completed   HPV Vaccine  Aged Out  *Topic was postponed. The date shown is not the original due date.

## 2023-02-28 ENCOUNTER — Other Ambulatory Visit: Payer: Self-pay

## 2023-02-28 ENCOUNTER — Encounter: Payer: Self-pay | Admitting: Internal Medicine

## 2023-02-28 ENCOUNTER — Telehealth: Payer: Self-pay | Admitting: Hematology and Oncology

## 2023-02-28 ENCOUNTER — Ambulatory Visit (INDEPENDENT_AMBULATORY_CARE_PROVIDER_SITE_OTHER): Payer: Medicare Other | Admitting: Internal Medicine

## 2023-02-28 VITALS — BP 136/70 | HR 64 | Temp 97.6°F | Ht 70.0 in | Wt 199.0 lb

## 2023-02-28 DIAGNOSIS — L2989 Other pruritus: Secondary | ICD-10-CM | POA: Diagnosis not present

## 2023-02-28 DIAGNOSIS — J310 Chronic rhinitis: Secondary | ICD-10-CM | POA: Diagnosis not present

## 2023-02-28 DIAGNOSIS — R21 Rash and other nonspecific skin eruption: Secondary | ICD-10-CM

## 2023-02-28 DIAGNOSIS — L299 Pruritus, unspecified: Secondary | ICD-10-CM

## 2023-02-28 DIAGNOSIS — J4489 Other specified chronic obstructive pulmonary disease: Secondary | ICD-10-CM | POA: Diagnosis not present

## 2023-02-28 DIAGNOSIS — L821 Other seborrheic keratosis: Secondary | ICD-10-CM | POA: Diagnosis not present

## 2023-02-28 MED ORDER — METHYLPREDNISOLONE ACETATE 40 MG/ML IJ SUSP
40.0000 mg | Freq: Once | INTRAMUSCULAR | Status: AC
Start: 2023-02-28 — End: 2023-02-28
  Administered 2023-02-28: 40 mg via INTRAMUSCULAR

## 2023-02-28 NOTE — Telephone Encounter (Signed)
Per Staff Message on 02/28/23; I called the patient's son, Al,  because he said he wanted to reschedule his mother's appointment. When I talked to Al he decided to just leave the appointment with the original date. Appointment was confirmed by son.

## 2023-02-28 NOTE — Patient Instructions (Addendum)
Chronic Itching and Rash Patient has a history of chronic itching and rash, previously treated with Dupixent. Current rash does not appear to be hives or eczema, but rather seborrheic keratosis and petechiae on thighs from scratching -Refer to dermatology for further evaluation and management. -Discussed moisturizing skin to help with itching (see skin care below) -Attempt to obtain records from [facility] for further information. -Administer 40mg  of Depo injection today to help with itching-discussed the adverse effects of chronic steroid use, and that this is not a great long term option. - can take zyrtec 10 mg up to twice daily as needed for itching  Mild Persistent Asthma/COPD: - Controller Inhaler: Continue Advair 250 mcg 1 puff twice a day; This Should Be Used Everyday - Rinse mouth out after use - During respiratory illness or asthma flares: Increase Advair  2 puffs twice daily  and continue for 2 weeks or until symptoms resolve. - Rescue Inhaler: Albuterol (Proair/Ventolin) 2 puffs . Use  every 4-6 hours as needed for chest tightness, wheezing, or coughing.   Can also use 15 minutes prior to exercise if you have symptoms with activity. - Asthma is not controlled if:  - Symptoms are occurring >2 times a week OR  - >2 times a month nighttime awakenings  - You are requiring systemic steroids (prednisone/steroid injections) more than once per year  - Your require hospitalization for your asthma.  - Please call the clinic to schedule a follow up if these symptoms arise Avoid smoke exposure Stay up-to-date with your annual flu vaccines, COVID vaccines and pneumonia vaccines when indicated. - consider environmental allergy testing via blood work  Follow up : as needed It was a pleasure meeting you and your family in clinic today! Thank you for allowing me to participate in your care.  Tonny Bollman, MD [facility]   For Skin Care Regimen: Daily Care For  Maintenance (daily and continue even once eczema controlled) - Use hypoallergenic hydrating ointment at least twice daily.  This must be done daily for control of flares. (Great options include Vaseline, CeraVe, Aquaphor, Aveeno, Cetaphil, VaniCream, etc) - Avoid detergents, soaps or lotions with fragrances/dyes - Limit showers/baths to 5 minutes and use luke warm water instead of hot, pat dry following baths, and apply moisturizer

## 2023-03-04 ENCOUNTER — Inpatient Hospital Stay: Payer: Medicare Other | Attending: Hematology and Oncology | Admitting: Hematology and Oncology

## 2023-03-04 VITALS — BP 132/73 | HR 74 | Temp 97.8°F | Resp 18 | Ht 70.0 in | Wt 198.0 lb

## 2023-03-04 DIAGNOSIS — Z170 Estrogen receptor positive status [ER+]: Secondary | ICD-10-CM | POA: Diagnosis not present

## 2023-03-04 DIAGNOSIS — K8020 Calculus of gallbladder without cholecystitis without obstruction: Secondary | ICD-10-CM | POA: Insufficient documentation

## 2023-03-04 DIAGNOSIS — Z79811 Long term (current) use of aromatase inhibitors: Secondary | ICD-10-CM | POA: Diagnosis not present

## 2023-03-04 DIAGNOSIS — Z8673 Personal history of transient ischemic attack (TIA), and cerebral infarction without residual deficits: Secondary | ICD-10-CM | POA: Diagnosis not present

## 2023-03-04 DIAGNOSIS — C50511 Malignant neoplasm of lower-outer quadrant of right female breast: Secondary | ICD-10-CM | POA: Insufficient documentation

## 2023-03-04 NOTE — Progress Notes (Signed)
Patient Care Team: [name], MD as PCP - General (Internal Medicine) Jake Bathe, MD as PCP - Cardiology (Cardiology) Lanier Prude, MD as PCP - Electrophysiology (Cardiology) Emelia Loron, MD as Consulting Physician (General Surgery) Serena Croissant, MD as Consulting Physician (Hematology and Oncology) Antony Blackbird, MD as Consulting Physician (Radiation Oncology)  DIAGNOSIS:  Encounter Diagnosis  Name Primary?   Malignant neoplasm of lower-outer quadrant of right breast of female, estrogen receptor positive (HCC) Yes    SUMMARY OF ONCOLOGIC HISTORY: Oncology History  Malignant neoplasm of lower-outer quadrant of right breast of female, estrogen receptor positive (HCC)  11/10/2017 Initial Diagnosis   Six-month follow-up of right breast masses: Anterior mass unchanged and posterior mass showed spiculation.  Biopsy attempt was made on posterior mass but accidentally the anterior mass was actually biopsied.  It revealed grade 1 invasive ductal carcinoma ER 100%, PR 100%, Ki-67 1%, HER-2 negative ratio 1.77; second attempt to biopsy the posterior mass resulted in the biopsy of an intermediate zone between the 2 masses that revealed ADH and ALH, (posterior mass still needs to be biopsied) T1 a N0 stage I a clinical stage   12/30/2017 Surgery   Right lumpectomy: IDC grade 1, 0.6 cm, ADH, ER 100%, PR 100%, HER-2 negative ratio 1.77, Ki-67 1%, margins negative.  Lymph nodes not evaluated.  T1b N0 stage Ia   12/30/2017 Surgery   Right lumpectomy: IDC with calcifications grade 1, 0.6 cm, ADH, margins negative, ER 100%, PR 90%, HER-2 negative ratio 1.77, Ki-67 1%, T1 be N0 stage Ia    01/05/2018 Genetic Testing   Negative genetic testing on the multicancer panel.  The Multi-Gene Panel offered by [facility] includes sequencing and/or deletion duplication testing of the following 84 genes: AIP, ALK, APC, ATM, AXIN2,BAP1,  BARD1, BLM, BMPR1A, BRCA1, BRCA2, BRIP1, CASR, CDC73, CDH1,  CDK4, CDKN1B, CDKN1C, CDKN2A (p14ARF), CDKN2A (p16INK4a), CEBPA, CHEK2, CTNNA1, DICER1, DIS3L2, EGFR (c.2369C>T, p.Thr790Met variant only), EPCAM (Deletion/duplication testing only), FH, FLCN, GATA2, GPC3, GREM1 (Promoter region deletion/duplication testing only), HOXB13 (c.251G>A, p.Gly84Glu), HRAS, KIT, MAX, MEN1, MET, MITF (c.952G>A, p.Glu318Lys variant only), MLH1, MSH2, MSH3, MSH6, MUTYH, NBN, NF1, NF2, NTHL1, PALB2, PDGFRA, PHOX2B, PMS2, POLD1, POLE, POT1, PRKAR1A, PTCH1, PTEN, RAD50, RAD51C, RAD51D, RB1, RECQL4, RET, RUNX1, SDHAF2, SDHA (sequence changes only), SDHB, SDHC, SDHD, SMAD4, SMARCA4, SMARCB1, SMARCE1, STK11, SUFU, TERC, TERT, TMEM127, TP53, TSC1, TSC2, VHL, WRN and WT1.  The report date is January 05, 2018.   01/11/2018 Cancer Staging   Staging form: Breast, AJCC 8th Edition - Pathologic: Stage IA (pT1b, pN0, cM0, G1, ER+, PR+, HER2-) - Signed by Loa Socks, NP on 01/11/2018   01/12/2018 -  Anti-estrogen oral therapy   Letrozole 2.5 mg daily     CHIEF COMPLIANT: Follow-up at the completion of letrozole therapy  History of Present Illness   The patient, with a history of breast cancer, has completed five years of Letrozole therapy. She reports adherence to the medication, taking it every night at bedtime. She has been diligent in attending follow-up appointments and mammograms.  In the past year, she experienced a gallbladder issue, which required the removal of approximately sixty percent of the gallstones. This was complicated by bleeding due to blood thinners.  She also has a history of stroke, which has affected her short-term memory. She is currently on Privigen for this issue. Despite these health concerns, she maintains independence in driving and daily activities, with the support of her family.      ALLERGIES:  is allergic to clarithromycin, aspirin, ibuprofen, codeine, diltiazem, methotrexate, tape, tapentadol, and verapamil hcl er.  MEDICATIONS:  Current  Outpatient Medications  Medication Sig Dispense Refill   albuterol (VENTOLIN HFA) 108 (90 Base) MCG/ACT inhaler INHALE 2 PUFFS BY MOUTH EVERY 4 HOURS AS NEEDED (Patient taking differently: Inhale 2 puffs into the lungs every 4 (four) hours as needed for wheezing or shortness of breath.) 18 g 3   amiodarone (PACERONE) 200 MG tablet Take 2 tablets (400 mg total) by mouth 2 (two) times daily for 5 days, THEN 1 tablet (200 mg total) daily for 21 days. Stop amiodarone after taking 200mg  daily for 21 days.. 41 tablet 0   ammonium lactate (LAC-HYDRIN) 12 % cream Apply 1 Application topically as needed for dry skin. 90 g 1   apixaban (ELIQUIS) 5 MG TABS tablet Take 1 tablet by mouth twice daily 180 tablet 1   DUPIXENT 300 MG/2ML SOPN Inject 300 mg into the skin every 14 (fourteen) days. (Patient not taking: Reported on 02/28/2023)     fluticasone-salmeterol (ADVAIR) 250-50 MCG/ACT AEPB INHALE 1 PUFF INTO LUNGS TWICE DAILY 60 each 11   letrozole (FEMARA) 2.5 MG tablet Take 1 tablet (2.5 mg total) by mouth daily. 90 tablet 0   Multiple Vitamin (MULTIVITAMIN) tablet Take 1 tablet by mouth daily.     pantoprazole (PROTONIX) 40 MG tablet Take 1 tablet (40 mg total) by mouth daily. (Patient not taking: Reported on 02/28/2023) 90 tablet 0   predniSONE (DELTASONE) 10 MG tablet 1 daily or as directed 50 tablet 3   triamcinolone cream (KENALOG) 0.1 % Apply 1 Application topically 2 (two) times daily. 30 g 0   Current Facility-Administered Medications  Medication Dose Route Frequency Provider Last Rate Last Admin   Mepolizumab SOLR 100 mg  100 mg Subcutaneous Q28 days Jetty Duhamel D, MD   100 mg at 12/20/17 1455    PHYSICAL EXAMINATION: ECOG PERFORMANCE STATUS: 1 - Symptomatic but completely ambulatory  Vitals:   03/04/23 0847  BP: 132/73  Pulse: 74  Resp: 18  Temp: 97.8 F (36.6 C)  SpO2: 98%   Filed Weights   03/04/23 0847  Weight: 198 lb (89.8 kg)     LABORATORY DATA:  I have reviewed the data  as listed    Latest Ref Rng & Units 11/07/2022    3:33 AM 11/06/2022    1:39 AM 11/05/2022   12:11 AM  CMP  Glucose 70 - 99 mg/dL 99  161  87   BUN 8 - 23 mg/dL 10  10  14    Creatinine 0.44 - 1.00 mg/dL 0.96  0.45  4.09   Sodium 135 - 145 mmol/L 137  142  140   Potassium 3.5 - 5.1 mmol/L 3.8  3.7  4.2   Chloride 98 - 111 mmol/L 103  101  105   CO2 22 - 32 mmol/L 26  27  22    Calcium 8.9 - 10.3 mg/dL 8.8  9.2  9.2   Total Protein 6.5 - 8.1 g/dL 5.9  5.9  5.8   Total Bilirubin 0.3 - 1.2 mg/dL 0.8  0.5  0.5   Alkaline Phos 38 - 126 U/L 100  116  138   AST 15 - 41 U/L 20  22  40   ALT 0 - 44 U/L 34  47  64     Lab Results  Component Value Date   WBC 10.3 01/26/2023   HGB 13.6 01/26/2023   HCT 43.3 01/26/2023  MCV 86.3 01/26/2023   PLT 283 01/26/2023   NEUTROABS 7.1 11/01/2022    ASSESSMENT & PLAN:  Malignant neoplasm of lower-outer quadrant of right breast of female, 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   Breast cancer surveillance: 1.  Breast exam 03/04/2023: Benign 2.  Mammogram:scheduled for 12/08/21   Hospitalization 11/01/2022-11/08/2022: Cholecystostomy tube dysfunction with bleeding RTC in 1 year ------------------------------------- Assessment and Plan    Breast Cancer Completed 5 years of Letrozole treatment with no reported issues. Regular mammograms have been performed. -Discontinue Letrozole. -Continue regular mammograms as per guidelines. -As needed follow-up.  Gallbladder Disease Recent gallbladder issue with successful removal of  approximately 60% of gallstones. No current complaints. -No specific plan discussed.  Memory Issues Reported short-term memory issues, possibly related to a previous stroke. Currently on Privigen. -No specific plan discussed.  General Health Maintenance / Followup Plans -As needed follow-up with oncology.        No orders of the defined types were placed in this encounter.  The patient has a good understanding of the overall plan. she agrees with it. she will call with any problems that may develop before the next visit here. Total time spent: 30 mins including face to face time and time spent for planning, charting and co-ordination of care   Tamsen Meek, MD 03/04/23

## 2023-03-04 NOTE — Assessment & Plan Note (Signed)
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   Breast cancer surveillance: 1.  Breast exam 03/04/2023: Benign 2.  Mammogram:scheduled for 12/08/21   Hospitalization 11/01/2022-11/08/2022: Cholecystostomy tube dysfunction with bleeding RTC in 1 year

## 2023-03-10 ENCOUNTER — Other Ambulatory Visit: Payer: Self-pay | Admitting: Internal Medicine

## 2023-03-10 DIAGNOSIS — J4489 Other specified chronic obstructive pulmonary disease: Secondary | ICD-10-CM

## 2023-03-21 ENCOUNTER — Ambulatory Visit: Payer: Medicare Other | Admitting: Neurology

## 2023-04-07 ENCOUNTER — Other Ambulatory Visit: Payer: Self-pay | Admitting: Internal Medicine

## 2023-04-14 DIAGNOSIS — L299 Pruritus, unspecified: Secondary | ICD-10-CM | POA: Diagnosis not present

## 2023-04-14 DIAGNOSIS — L2084 Intrinsic (allergic) eczema: Secondary | ICD-10-CM | POA: Diagnosis not present

## 2023-04-15 ENCOUNTER — Other Ambulatory Visit: Payer: Self-pay | Admitting: Internal Medicine

## 2023-04-15 DIAGNOSIS — J4489 Other specified chronic obstructive pulmonary disease: Secondary | ICD-10-CM

## 2023-04-24 ENCOUNTER — Other Ambulatory Visit: Payer: Self-pay | Admitting: Internal Medicine

## 2023-04-25 ENCOUNTER — Other Ambulatory Visit: Payer: Self-pay

## 2023-04-25 ENCOUNTER — Encounter (HOSPITAL_COMMUNITY): Payer: Self-pay

## 2023-04-25 MED ORDER — DUPIXENT 300 MG/2ML ~~LOC~~ SOAJ
300.0000 mg | SUBCUTANEOUS | 0 refills | Status: DC
  Filled 2023-04-25: qty 4, 28d supply, fill #0

## 2023-04-25 MED ORDER — PANTOPRAZOLE SODIUM 40 MG PO TBEC: 40.0000 mg | DELAYED_RELEASE_TABLET | Freq: Every day | ORAL | 0 refills | Status: DC

## 2023-04-25 NOTE — Progress Notes (Signed)
Specialty Pharmacy Initiation Note   Laura Mendez is a 82 y.o. female who will be followed by the specialty pharmacy service for RxSp Atopic Dermatitis    Review of administration, indication, effectiveness, safety, potential side effects, storage/disposable, and missed dose instructions occurred today for patient's specialty medication(s) Dupilumab     Patient/Caregiver did not have any additional questions or concerns.   Patient's therapy is appropriate to: Initiate    Goals Addressed             This Visit's Progress    Reduce signs and symptoms       Patient is initiating therapy. Patient will maintain adherence. Patient had been on treatment previously with success but has been off injections for a few months due to other health issues and will be restarting.          Otto Herb Specialty Pharmacist

## 2023-04-25 NOTE — Progress Notes (Signed)
Specialty Pharmacy Initial Fill Coordination Note  Cassaundra Pasquinelli is a 82 y.o. female contacted today regarding initial fill of specialty medication(s) Dupilumab   Patient requested Delivery   Delivery date: 05/03/23   Verified address: [address]   Medication will be filled on 12/9.   Patient is aware of $0 copayment.

## 2023-05-02 ENCOUNTER — Other Ambulatory Visit: Payer: Self-pay

## 2023-05-02 NOTE — Progress Notes (Signed)
Patient getting with [facility] Special[facility] pharmacy. Dis-enrolling

## 2023-05-05 DIAGNOSIS — H5213 Myopia, bilateral: Secondary | ICD-10-CM | POA: Diagnosis not present

## 2023-05-05 DIAGNOSIS — H353131 Nonexudative age-related macular degeneration, bilateral, early dry stage: Secondary | ICD-10-CM | POA: Diagnosis not present

## 2023-05-05 DIAGNOSIS — Z961 Presence of intraocular lens: Secondary | ICD-10-CM | POA: Diagnosis not present

## 2023-05-11 ENCOUNTER — Encounter: Payer: Self-pay | Admitting: Cardiology

## 2023-05-11 ENCOUNTER — Ambulatory Visit: Payer: Medicare Other | Attending: Cardiology | Admitting: Cardiology

## 2023-05-11 VITALS — BP 120/70 | HR 76 | Ht 70.0 in | Wt 198.0 lb

## 2023-05-11 DIAGNOSIS — I480 Paroxysmal atrial fibrillation: Secondary | ICD-10-CM

## 2023-05-11 NOTE — Patient Instructions (Signed)
Medication Instructions:  The current medical regimen is effective;  continue present plan and medications.  *If you need a refill on your cardiac medications before your next appointment, please call your pharmacy*   Follow-Up: At [facility], you and your health needs are our priority.  As part of our continuing mission to provide you with exceptional heart care, we have created designated Provider Care Teams.  These Care Teams include your primary Cardiologist (physician) and Advanced Practice Providers (APPs -  Physician Assistants and Nurse Practitioners) who all work together to provide you with the care you need, when you need it.  We recommend signing up for the patient portal called "MyChart".  Sign up information is provided on this After Visit Summary.  MyChart is used to connect with patients for Virtual Visits (Telemedicine).  Patients are able to view lab/test results, encounter notes, upcoming appointments, etc.  Non-urgent messages can be sent to your provider as well.   To learn more about what you can do with MyChart, go to [email or web address].    Your next appointment:   1 year(s)  Provider:   Jari Favre, PA-C

## 2023-05-11 NOTE — Progress Notes (Signed)
Cardiology Office Note:  .   Date:  05/11/2023  ID:  Ward Ammirati, DOB 1940-11-26, MRN [id number] PCP: [name], MD  [facility] Providers Cardiologist:  Donato Schultz, MD Electrophysiologist:  Lanier Prude, MD     History of Present Illness: .   Laura Mendez is a 82 y.o. female Discussed with the use of AI scribe   History of Present Illness   The patient is an 82 year old with a history of atrial fibrillation/flutter diagnosed in 2017. She autoconverted after five of metoprolol and has been on Eliquis since then, with no major bleeding. Hemoglobin is 13.6, creatinine 0.9, and LDL 69. The patient had a discussion with Dr. Lalla Brothers in 2023 about a possible ablation but decided to hold off. She experienced a TIA of left eye partial vision loss and is over 76 years old, which elevates her CHADS-VASc score to at least five.  The patient has been experiencing shortness of breath, which she attributes to the season and her asthma. She is currently taking Eliquis for atrial fibrillation to protect against stroke but is not on any AV nodal blocking agents. She had an echocardiogram on October 25, 2022, which showed a normal pump, EF of 60-65%, a moderately dilated left atrium, and some mitral calcification around the mitral valve of the heart. There may be some tightness to the valve due to the calcification, but it is not expected to be of major significance.  The patient had a heart catheterization in July 2023, which showed patent coronary arteries, mild irregularities, and normal pressures in the heart. She has been managing her medications with a pill box, taking them in the morning and at night.          Studies Reviewed: Marland Kitchen   EKG Interpretation Date/Time:  Wednesday May 11 2023 08:47:50 EST Ventricular Rate:  76 PR Interval:  136 QRS Duration:  88 QT Interval:  404 QTC Calculation: 454 R Axis:   25  Text Interpretation: Sinus rhythm with  Premature supraventricular complexes Nonspecific ST and T wave abnormality When compared with ECG of 02-Nov-2022 04:42, Premature atrial complexes are now present Confirmed by Donato Schultz ([id number]) on 05/11/2023 9:11:34 AM    Results   LABS Hb: 13.6 Cr: 0.9 LDL: 69  DIAGNOSTIC EKG: Normal with PACs Echocardiogram: EF 60-65%, moderately dilated left atrium, mitral calcification with mild stenosis (10/25/2022) Cardiac catheterization: Patent coronary arteries, mild irregularities, normal pressures (11/23/2021)     Risk Assessment/Calculations:            Physical Exam:   VS:  BP 120/70   Pulse 76   Ht 5\' 10"  (1.778 m)   Wt 198 lb (89.8 kg) Comment: Per patient stated "I weighed this morning"  SpO2 95%   BMI 28.41 kg/m    Wt Readings from Last 3 Encounters:  05/11/23 198 lb (89.8 kg)  03/04/23 198 lb (89.8 kg)  02/28/23 199 lb (90.3 kg)    GEN: Well nourished, well developed in no acute distress NECK: No JVD; No carotid bruits CARDIAC: RRR, no murmurs, no rubs, no gallops RESPIRATORY:  Clear to auscultation without rales, wheezing or rhonchi  ABDOMEN: Soft, non-tender, non-distended EXTREMITIES:  No edema; No deformity   ASSESSMENT AND PLAN: .    Assessment and Plan    Atrial Fibrillation Diagnosed in 2017, autoconverted after five days of metoprolol, on Eliquis since with no major bleeding. Hemoglobin 13.6, creatinine 0.9, LDL 69. EKG shows PACs, no atrial fibrillation. Echocardiogram (June  2024) shows normal ejection fraction (60-65%), moderately dilated left atrium with mitral calcification. Heart catheterization (July 2023) shows patent coronary arteries with mild irregularities and normal pressures. Discussed importance of Eliquis in stroke prevention. 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 Emphasized need for continued anticoagulation  therapy. - Maintain current anticoagulation therapy with Eliquis  Asthma Seasonal shortness of breath exacerbated by pollen. No recent changes in management. Advised to stay indoors during high pollen days. - Advise to stay indoors during high pollen days - Continue current asthma management  General Health Maintenance Regular follow-ups with primary care physician Dr. Okey Dupre, frequent blood work checks. - Continue regular follow-ups with primary care physician - Maintain current health regimen  Follow-up - Follow up in one year with PA [facility] - Provide summary page.              Signed, Donato Schultz, MD

## 2023-05-19 ENCOUNTER — Other Ambulatory Visit: Payer: Self-pay | Admitting: Hematology and Oncology

## 2023-05-19 NOTE — Telephone Encounter (Signed)
Letrozole d/c 02/2023 by Dr. Pamelia Hoit. 5 years completed

## 2023-06-06 ENCOUNTER — Other Ambulatory Visit: Payer: Self-pay | Admitting: Internal Medicine

## 2023-06-06 DIAGNOSIS — J4489 Other specified chronic obstructive pulmonary disease: Secondary | ICD-10-CM

## 2023-06-13 ENCOUNTER — Telehealth: Payer: Self-pay | Admitting: Neurology

## 2023-06-13 NOTE — Telephone Encounter (Signed)
Appointment needed to be r/s

## 2023-06-16 ENCOUNTER — Ambulatory Visit: Payer: Medicare Other | Admitting: Neurology

## 2023-07-04 ENCOUNTER — Telehealth: Payer: Self-pay | Admitting: Internal Medicine

## 2023-07-04 MED ORDER — PREDNISONE 10 MG PO TABS: ORAL_TABLET | ORAL | 3 refills | Status: DC

## 2023-07-04 NOTE — Telephone Encounter (Signed)
 [facility] requests refill prednisone  taper to hold

## 2023-07-10 ENCOUNTER — Other Ambulatory Visit: Payer: Self-pay | Admitting: Internal Medicine

## 2023-07-10 DIAGNOSIS — J4489 Other specified chronic obstructive pulmonary disease: Secondary | ICD-10-CM

## 2023-07-18 ENCOUNTER — Other Ambulatory Visit: Payer: Self-pay | Admitting: Internal Medicine

## 2023-07-26 ENCOUNTER — Other Ambulatory Visit: Payer: Self-pay | Admitting: Internal Medicine

## 2023-07-26 DIAGNOSIS — J4489 Other specified chronic obstructive pulmonary disease: Secondary | ICD-10-CM

## 2023-07-30 NOTE — Progress Notes (Unsigned)
 Patient ID: Laura Mendez, female    DOB: 1940-12-20, 83 y.o.   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 Fib Labs 11/23/16- allergy profile total IgE 409 on Xolair, elevated specific IgE for dust mite and cockroach. EOS 3,300, Nl ANA and alpha-gal Derm Path report reviewed- Spongiotic/ eczema process- does not describe it as urticaria. Xolair was changed to [facility] June, 2018, to see if Xolair was causing her rash-no improvement after 5 months Office Spirometry 12/20/2017-very severe obstructive airways disease.  FVC 2.09/60%, FEV1 0.90/34%, ratio 0.43 Quantiferon tb Gold 06/22/17- Neg ---------------------------------------------------------------.   07/29/22-  83 year old female former smoker followed for severe asthma/COPD(formerly used Xolair/Nucala), complicated by allergic rhinitis, paroxysmal A. Fib/ Eliquis., MI, CVA, Rash/ Atopic Dermatitis, breast cancer R,  -Xarelto,  neb Duoneb, Wixela 250, ventolin hfa, Dupixent,  Covid vax-4 Phizer Flu vax-had ED 03/29/22- Asthma> solumedrol, neb, doxycycline,  ------Patient states she is doing okay  Asks prednisone for exacerbation of her chronic rash, saying nothing else really helps.  She reports taking 5 or 10 mg maybe 4 times a month.  She continues Dupixent from dermatologist in [place] and admits it does help rash and seems to help her breathing some as well.  Her respiratory status had responded dramatically to Xolair years ago.  She was changed to Dupixent to see if that would help the rash which developed while she was on Xolair. Respiratory Meds otherwise are good and she feels her breathing is doing well.  Anticipates more problems as the spring pollens come in. CXR 03/29/22-  IMPRESSION: Bilateral subsegmental atelectasis as described above.  08/02/23- 83 year old female former smoker followed for severe asthma/COPD(formerly used Xolair/Nucala),  complicated by allergic rhinitis, paroxysmal A. Fib/ Eliquis., MI, CVA, Rash/ Atopic Dermatitis, breast cancer R,  -Xarelto,  neb Duoneb, Wixela 250, ventolin hfa, Dupixent,      Review of Systems-see HPI   + = positive Constitutional:   No-   weight loss, night sweats, fevers, chills, fatigue, lassitude. HEENT:   No-  headaches, difficulty swallowing, tooth/dental problems, sore throat,       No-  sneezing, +itching, ear ache,  nasal congestion, +post nasal drip,  CV:  No-   chest pain, orthopnea, PND, swelling in lower extremities, anasarca, dizziness, palpitations Resp:  + shortness of breath with exertion or at rest- unchanged             No-   productive cough,  + non-productive cough,  No-  coughing up of blood.              No-   change in color of mucus.  + wheezing.   Skin:  +rash or lesions. GI:  No-   heartburn, indigestion, abdominal pain, nausea, vomiting, GU:  MS:  +   joint pain or swelling.   Neuro- nothing unusual  Psych:  No- change in mood or affect. No depression or anxiety.  No memory loss  Objective:    General- Alert, Oriented, Affect-appropriate, Distress- none acute, Pleasant, + Overweight Skin- + areas of erythema and some scale on pressure sites- extensor elbows, back, thighs Lymphadenopathy- none Head- atraumatic            Eyes- Gross vision intact, PERRLA, conjunctivae clear secretions            Ears- Hearing, canals normal            Nose- Mild turbinate  edema, No-Septal dev, mucus, polyps, erosion, perforation             Throat- Mallampati II , mucosa -not red, drainage- none, tonsils- atrophic Neck- flexible , trachea midline, no stridor , thyroid nl, carotid no bruit Chest - symmetrical excursion , unlabored           Heart/CV- RRR +, no murmur , no gallop  , no rub, nl s1 s2                           - JVD- none , edema- none, stasis changes- none, varices- none           Lung- +clear to P&A/ distant/unlabored, wheeze- none,  Cough-none,  dullness-none, rub- none           Chest wall-  Abd-  Br/ Gen/ Rectal- Not done, not indicated Extrem- cyanosis- none, clubbing, none, atrophy- none, strength- nl Neuro- grossly intact to observation

## 2023-08-02 ENCOUNTER — Encounter: Payer: Self-pay | Admitting: Internal Medicine

## 2023-08-02 ENCOUNTER — Ambulatory Visit

## 2023-08-02 ENCOUNTER — Ambulatory Visit: Payer: Medicare Other | Admitting: Internal Medicine

## 2023-08-02 VITALS — BP 130/70 | HR 76 | Temp 97.6°F | Ht 70.0 in | Wt 207.2 lb

## 2023-08-02 DIAGNOSIS — J4489 Other specified chronic obstructive pulmonary disease: Secondary | ICD-10-CM

## 2023-08-02 DIAGNOSIS — J449 Chronic obstructive pulmonary disease, unspecified: Secondary | ICD-10-CM | POA: Diagnosis not present

## 2023-08-02 DIAGNOSIS — I4891 Unspecified atrial fibrillation: Secondary | ICD-10-CM | POA: Diagnosis not present

## 2023-08-02 DIAGNOSIS — L509 Urticaria, unspecified: Secondary | ICD-10-CM

## 2023-08-02 NOTE — Patient Instructions (Signed)
 Order- CXR dx Asthma/ COPD overlap  We can continue same meds  Please call if we can help

## 2023-08-05 ENCOUNTER — Other Ambulatory Visit: Payer: Self-pay | Admitting: Cardiology

## 2023-08-05 DIAGNOSIS — I480 Paroxysmal atrial fibrillation: Secondary | ICD-10-CM

## 2023-08-05 NOTE — Telephone Encounter (Signed)
 Eliquis 5mg  refill request received. Patient is 83 years old, weight-94kg, Crea-0.90 on 11/07/22, Diagnosis-Afib, and last seen by Dr. Anne Fu on 05/11/23. Dose is appropriate based on dosing criteria. Will send in refill to requested pharmacy.

## 2023-08-08 DIAGNOSIS — L259 Unspecified contact dermatitis, unspecified cause: Secondary | ICD-10-CM | POA: Diagnosis not present

## 2023-08-11 DIAGNOSIS — L2084 Intrinsic (allergic) eczema: Secondary | ICD-10-CM | POA: Diagnosis not present

## 2023-08-11 DIAGNOSIS — L249 Irritant contact dermatitis, unspecified cause: Secondary | ICD-10-CM | POA: Diagnosis not present

## 2023-08-11 DIAGNOSIS — L299 Pruritus, unspecified: Secondary | ICD-10-CM | POA: Diagnosis not present

## 2023-08-24 ENCOUNTER — Encounter: Payer: Self-pay | Admitting: Neurology

## 2023-08-24 ENCOUNTER — Ambulatory Visit: Payer: Medicare Other | Admitting: Neurology

## 2023-08-24 ENCOUNTER — Telehealth: Payer: Self-pay | Admitting: Neurology

## 2023-08-24 VITALS — BP 139/71 | HR 69 | Ht 70.0 in | Wt 207.0 lb

## 2023-08-24 DIAGNOSIS — Z8669 Personal history of other diseases of the nervous system and sense organs: Secondary | ICD-10-CM | POA: Diagnosis not present

## 2023-08-24 DIAGNOSIS — Z5181 Encounter for therapeutic drug level monitoring: Secondary | ICD-10-CM | POA: Diagnosis not present

## 2023-08-24 DIAGNOSIS — Z8673 Personal history of transient ischemic attack (TIA), and cerebral infarction without residual deficits: Secondary | ICD-10-CM

## 2023-08-24 DIAGNOSIS — I482 Chronic atrial fibrillation, unspecified: Secondary | ICD-10-CM

## 2023-08-24 DIAGNOSIS — R799 Abnormal finding of blood chemistry, unspecified: Secondary | ICD-10-CM | POA: Diagnosis not present

## 2023-08-24 DIAGNOSIS — G3184 Mild cognitive impairment, so stated: Secondary | ICD-10-CM

## 2023-08-24 DIAGNOSIS — R413 Other amnesia: Secondary | ICD-10-CM

## 2023-08-24 DIAGNOSIS — Z79899 Other long term (current) drug therapy: Secondary | ICD-10-CM | POA: Diagnosis not present

## 2023-08-24 NOTE — Telephone Encounter (Signed)
 no auth required sent to GI [phone]

## 2023-08-24 NOTE — Patient Instructions (Signed)
 I had a long discussion with the patient and her son regarding her subacute memory and speech difficulties which likely represent mild cognitive impairment likely combination of prior strokes and aging.  I recommend further evaluation by checking dementia panel labs, EEG and MRI scan of the brain.  I encouraged her to increase participation in cognitively challenging activities like solving crossword puzzles, playing bridge and sudoku.  We also discussed memory compensation strategies.  She also has history of atrial fibrillation and prior embolic strokes and so recommend she continue Eliquis for stroke prevention and maintain aggressive risk factor modification with strict control of hypertension with blood pressure goal below 140/90, lipids with LDL cholesterol goal below 70 mg percent and diabetes with hemoglobin A1c goal below 6.5%.  Check lipid panel and hemoglobin A1c today.  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  She was encouraged to use a cane when walking outdoors at all times and we also discussed fall safety precautions.  She will return for follow-up in the future in 6 months with nurse practitioner call earlier if necessary.  Memory Compensation Strategies  Use "WARM" strategy.  W= write it down  A= associate it  R= repeat it  M= make a mental note  2.   You can keep a Glass blower/designer.  Use a 3-ring notebook with sections for the following: calendar, important names and phone numbers,  medications, doctors' names/phone numbers, lists/reminders, and a section to journal what you did  each day.   3.    Use a calendar to write appointments down.  4.    Write yourself a schedule for the day.  This can be placed on the calendar or in a separate section of the Memory Notebook.  Keeping a  regular schedule can help  memory.  5.    Use medication organizer with sections for each day or morning/evening pills.  You may need help loading it  6.    Keep a basket, or pegboard by the door.  Place items that you need to take out with you in the basket or on the pegboard.  You may also want to  include a message board for reminders.  7.    Use sticky notes.  Place sticky notes with reminders in a place where the task is performed.  For example: " turn off the  stove" placed by the stove, "lock the door" placed on the door at eye level, " take your medications" on  the bathroom mirror or by the place where you normally take your medications.  8.    Use alarms/timers.  Use while cooking to remind yourself to check on food or as a reminder to take your medicine, or as a  reminder to make a call, or as a reminder to perform another task, etc.

## 2023-08-24 NOTE — Progress Notes (Signed)
 [facility] [address] [place]. [place] [address] [phone]       OFFICE CONSULT NOTE  [name] Date of Birth:  05/15/1941 Medical Record Number:  [id number]   Referring MD: Manning Charity, DO  Reason for Referral: Vertical diplopia  HPI: Laura Mendez is a 83 year old Caucasian lady seen today for office consultation visit.  She is accompanied by her son.  History is obtained from them and review of electronic medical records and I personally viewed pertinent available imaging films and results in PACS.  She has past medical history of atrial fibrillation/flutter in 2017, asthma, COPD, diastolic heart failure, urticaria and TIA.  Patient states that she had several several days of episode of vertical diplopia last year.  She describes this as being binocular and improved when she closes either eye.  She was seen by Dr. 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  She was referred to me but she missed a couple of appointments.  She states the diplopia resolved within few days.  She had no accompanying headache, vertigo, gait or balance difficulties at that time.  Patient and husband actually state today the main concern is her memory and some repeating herself which she has had for 6 to 12 months.  They are concerned about whether she will be developing dementia.  Patient states her long-term memory is quite good.  She has trouble making new memory and often has to repeat herself several times.  She is quite independent in activities of daily living.  She just does not cook.  She is still driving and goes out.  She will as no family history of dementia.  She denies any current headaches, gait balance difficulties or weakness.  She denies any delusions, hallucinations.  There is no agitation or violent behavior or unsafe behavior.  She remains on Eliquis for her A-fib a flutter and is tolerating well with minor bruising and no  bleeding.  She was seen 0n 06/28/2021 for sudden onset left-sided weakness and a fall.  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  CT angiogram showed mild atherosclerotic plaques but no large vessel stenosis at that time.  Patient was on Xarelto and was considered a failure and hence started on Eliquis for A-fib at that time.  She was advised to follow-up with me but did not see me at that time.  ROS:   14 system review of systems is positive for diplopia, vision difficulty, memory difficulties, word finding difficulties all other systems negative  PMH:  Past Medical History:  Diagnosis Date   Asthma    Breast cancer (HCC)    Chronic airway obstruction, not elsewhere classified    Diastolic dysfunction without heart failure    Disorder of bone and cartilage, unspecified    Dysrhythmia    A-Fib   Family history of colon cancer    Family history of kidney cancer    Family history of ovarian cancer    Paroxysmal atrial flutter (HCC)    Stroke (HCC)    TIA   TIA (transient ischemic attack)    Unspecified asthma(493.90)    Urticaria     Social History:  Social History   Socioeconomic History   Marital status: Divorced    Spouse name: Not on file   Number of children: Not on file   Years of  education: Not on file   Highest education level: Not on file  Occupational History   Occupation: retired  Tobacco Use   Smoking status: Former    Current packs/day: 0.00    Types: Cigarettes    Start date: 05/24/1977    Quit date: 05/24/1977    Years since quitting: 46.2   Smokeless tobacco: Never   Tobacco comments:    7/7 pt states she occasionally smoked, let cigs "burn" more than she smoked them  Vaping Use   Vaping status: Never Used  Substance and Sexual Activity   Alcohol use: No   Drug use: No   Sexual activity: Not on file  Other  Topics Concern   Not on file  Social History Narrative   Right handed   1 coke per day   Lives alone   Social Drivers of Health   Financial Resource Strain: Low Risk  (02/25/2023)   Overall Financial Resource Strain (CARDIA)    Difficulty of Paying Living Expenses: Not hard at all  Food Insecurity: No Food Insecurity (02/25/2023)   Hunger Vital Sign    Worried About Running Out of Food in the Last Year: Never true    Ran Out of Food in the Last Year: Never true  Transportation Needs: No Transportation Needs (02/25/2023)   PRAPARE - Administrator, Civil Service (Medical): No    Lack of Transportation (Non-Medical): No  Physical Activity: Inactive (02/25/2023)   Exercise Vital Sign    Days of Exercise per Week: 0 days    Minutes of Exercise per Session: 30 min  Stress: No Stress Concern Present (02/25/2023)   [employer] of Occupational Health - Occupational Stress Questionnaire    Feeling of Stress : Not at all  Social Connections: Socially Isolated (02/25/2023)   Social Connection and Isolation Panel [NHANES]    Frequency of Communication with Friends and Family: More than three times a week    Frequency of Social Gatherings with Friends and Family: Three times a week    Attends Religious Services: Never    Active Member of Clubs or Organizations: No    Attends Banker Meetings: Never    Marital Status: Widowed  Intimate Partner Violence: Not At Risk (02/25/2023)   Humiliation, Afraid, Rape, and Kick questionnaire    Fear of Current or Ex-Partner: No    Emotionally Abused: No    Physically Abused: No    Sexually Abused: No    Medications:   Current Outpatient Medications on File Prior to Visit  Medication Sig Dispense Refill   albuterol (VENTOLIN HFA) 108 (90 Base) MCG/ACT inhaler INHALE 2 PUFFS BY MOUTH EVERY 4 HOURS AS NEEDED 18 g 11   ammonium lactate (LAC-HYDRIN) 12 % cream Apply 1 Application topically as needed for dry skin. 90 g 1    apixaban (ELIQUIS) 5 MG TABS tablet Take 1 tablet by mouth twice daily 180 tablet 1   Apoaequorin (PREVAGEN PO) Take by mouth daily.     DUPIXENT 300 MG/2ML SOAJ Every other Thursday     fluticasone-salmeterol (ADVAIR) 250-50 MCG/ACT AEPB INHALE 1 PUFF INTO LUNGS TWICE DAILY 60 each 5   Multiple Vitamin (MULTIVITAMIN) tablet Take 1 tablet by mouth daily.     pantoprazole (PROTONIX) 40 MG tablet Take 1 tablet by mouth once daily 90 tablet 0   predniSONE (DELTASONE) 10 MG tablet 1 daily or as directed 50 tablet 3   triamcinolone cream (KENALOG) 0.1 % Apply 1 Application topically 2 (  two) times daily. (Patient taking differently: Apply 1 Application topically as needed.) 30 g 0   Current Facility-Administered Medications on File Prior to Visit  Medication Dose Route Frequency Provider Last Rate Last Admin   Mepolizumab SOLR 100 mg  100 mg Subcutaneous Q28 days Jetty Duhamel D, MD   100 mg at 12/20/17 1455    Allergies:   Allergies  Allergen Reactions   Clarithromycin Anaphylaxis    "just about killed me"   Aspirin Hives   Ibuprofen Hives   Codeine Nausea And Vomiting   Diltiazem Rash   Methotrexate Other (See Comments)     tongue swelling and mouth soreness   Tape Itching and Other (See Comments)    Tears skin   Tapentadol Itching   Verapamil Hcl Er Rash    Physical Exam General: well developed, well nourished, seated, in no evident distress Head: head normocephalic and atraumatic.   Neck: supple with no carotid or supraclavicular bruits Cardiovascular: regular rate and rhythm, no murmurs Musculoskeletal: no deformity Skin:  no rash/petichiae Vascular:  Normal pulses all extremities  Neurologic Exam Mental Status: Awake and fully alert. Oriented to place and time. Recent and remote memory intact. Attention span, concentration and fund of knowledge appropriate. Mood and affect appropriate.  Diminished recall 0/3.  Able to name 11 animals which can walk on 4 legs.  Clock drawing  4/4. Cranial Nerves: Fundoscopic exam reveals sharp disc margins. Pupils equal, briskly reactive to light. Extraocular movements full without nystagmus. Visual fields full to confrontation. Hearing intact. Facial sensation intact. Face, tongue, palate moves normally and symmetrically.  Motor: Normal bulk and tone. Normal strength in all tested extremity muscles. Sensory.: intact to touch , pinprick , position and vibratory sensation.  Coordination: Rapid alternating movements normal in all extremities. Finger-to-nose and heel-to-shin performed accurately bilaterally. Gait and Station: Arises from chair without difficulty. Stance is normal. Gait demonstrates normal stride length and balance . Able to heel, toe and tandem walk with moderate difficulty.  Reflexes: 1+ and symmetric. Toes downgoing.   NIHSS  0 Modified Rankin  0   ASSESSMENT: 83 year old Caucasian lady with remote episode of transient vertical diplopia which has resolved and was of unclear significance.  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  She also complains of mild memory difficulties due to mild cognitive impairment.     PLAN:I had a long discussion with the patient and her son regarding her subacute memory and speech difficulties which likely represent mild cognitive impairment likely combination of prior strokes and aging.  I recommend further evaluation by checking dementia panel labs, EEG and MRI scan of the brain.  I encouraged her to increase participation in cognitively challenging activities like solving crossword puzzles, playing bridge and sudoku.  We also discussed memory compensation strategies.  She also has history of atrial fibrillation and prior embolic strokes and so recommend she continue Eliquis for stroke prevention and maintain aggressive risk  factor modification with strict control of hypertension with blood pressure goal below 140/90, lipids with LDL cholesterol goal below 70 mg percent and diabetes with hemoglobin A1c goal below 6.5%.  Check lipid panel and hemoglobin A1c today.  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  She was encouraged to use a cane when walking outdoors at all times and we also discussed fall safety precautions.  She will return for follow-up in the future in 6 months with nurse practitioner call earlier if necessary. Greater than 50% time during this 60-minute consultation visit was spent on counseling and coordination of care about her atrial fibrillation, embolic strokes, transient diplopia and mild cognitive impairment and answering questions. Delia Heady, MD Note: This document was prepared with digital dictation and possible smart phrase technology. Any transcriptional errors that result from this process are unintentional.

## 2023-08-26 LAB — LIPID PANEL
Chol/HDL Ratio: 4 ratio (ref 0.0–4.4)
Cholesterol, Total: 191 mg/dL (ref 100–199)
HDL: 48 mg/dL (ref >39)
LDL Chol Calc (NIH): 103 mg/dL — ABNORMAL HIGH (ref 0–99)
Triglycerides: 235 mg/dL — ABNORMAL HIGH (ref 0–149)
VLDL Cholesterol Cal: 40 mg/dL (ref 5–40)

## 2023-08-26 LAB — DEMENTIA PANEL
Homocysteine: 13.5 umol/L (ref 0.0–21.3)
RPR Ser Ql: NONREACTIVE
TSH: 11.9 u[IU]/mL — ABNORMAL HIGH (ref 0.450–4.500)
Vitamin B-12: 585 pg/mL (ref 232–1245)

## 2023-08-26 LAB — HEMOGLOBIN A1C
Est. average glucose Bld gHb Est-mCnc: 120 mg/dL
Hgb A1c MFr Bld: 5.8 % — ABNORMAL HIGH (ref 4.8–5.6)

## 2023-08-26 NOTE — Progress Notes (Signed)
 Kindly inform the patient that lab work for reversible causes of memory loss suggest her thyroid is underactive.  Her cholesterol profile is also slightly suboptimal.  Screening test for diabetes is satisfactory.  Kindly see primary care physician for further management of these

## 2023-08-29 ENCOUNTER — Ambulatory Visit: Payer: Medicare Other | Admitting: Internal Medicine

## 2023-09-15 ENCOUNTER — Other Ambulatory Visit: Admitting: Neurology

## 2023-09-15 DIAGNOSIS — R4182 Altered mental status, unspecified: Secondary | ICD-10-CM | POA: Diagnosis not present

## 2023-09-20 NOTE — Progress Notes (Signed)
 Kindly inform the patient that EEG of brainwave study shows no seizure activity.  Mild slowing of brain activity which can be seen in a variety of conditions including age, memory loss and other conditions.  Nothing to worry about

## 2023-10-07 ENCOUNTER — Ambulatory Visit
Admission: RE | Admit: 2023-10-07 | Discharge: 2023-10-07 | Disposition: A | Discharge status: Home / Self Care | Source: Ambulatory Visit | Attending: Neurology | Admitting: Neurology

## 2023-10-07 DIAGNOSIS — R413 Other amnesia: Secondary | ICD-10-CM | POA: Diagnosis not present

## 2023-10-07 MED ORDER — GADOPICLENOL 0.5 MMOL/ML IV SOLN
10.0000 mL | Freq: Once | INTRAVENOUS | Status: AC | PRN
  Administered 2023-10-07: 10 mL via INTRAVENOUS

## 2023-10-08 ENCOUNTER — Ambulatory Visit: Payer: Self-pay | Admitting: Neurology

## 2023-10-08 NOTE — Progress Notes (Signed)
 Kindly inform the patient that MRI scan of the brain shows only mild age-related changes of hardening of the arteries.  No worrisome finding

## 2023-10-10 ENCOUNTER — Other Ambulatory Visit: Payer: Self-pay

## 2023-10-10 ENCOUNTER — Ambulatory Visit: Admitting: Internal Medicine

## 2023-10-10 ENCOUNTER — Encounter: Payer: Self-pay | Admitting: Internal Medicine

## 2023-10-10 VITALS — BP 128/72 | HR 62 | Temp 98.1°F | Ht 70.0 in | Wt 204.3 lb

## 2023-10-10 DIAGNOSIS — J4489 Other specified chronic obstructive pulmonary disease: Secondary | ICD-10-CM

## 2023-10-10 DIAGNOSIS — J310 Chronic rhinitis: Secondary | ICD-10-CM | POA: Diagnosis not present

## 2023-10-10 DIAGNOSIS — L308 Other specified dermatitis: Secondary | ICD-10-CM

## 2023-10-10 DIAGNOSIS — L2389 Allergic contact dermatitis due to other agents: Secondary | ICD-10-CM | POA: Insufficient documentation

## 2023-10-10 DIAGNOSIS — L259 Unspecified contact dermatitis, unspecified cause: Secondary | ICD-10-CM | POA: Insufficient documentation

## 2023-10-10 DIAGNOSIS — L235 Allergic contact dermatitis due to other chemical products: Secondary | ICD-10-CM | POA: Diagnosis not present

## 2023-10-10 MED ORDER — ALBUTEROL SULFATE HFA 108 (90 BASE) MCG/ACT IN AERS: 2.0000 | INHALATION_SPRAY | RESPIRATORY_TRACT | 11 refills | Status: DC | PRN

## 2023-10-10 MED ORDER — FLUTICASONE-SALMETEROL 250-50 MCG/ACT IN AEPB: 1.0000 | INHALATION_SPRAY | Freq: Two times a day (BID) | RESPIRATORY_TRACT | 5 refills | Status: AC

## 2023-10-10 NOTE — Patient Instructions (Addendum)
 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 - Rescue Inhaler: Albuterol  (Proair /Ventolin ) 2 puffs . Use  every 4-6 hours as needed for chest tightness, wheezing, or coughing.   Can also use 15 minutes prior to exercise if you have symptoms with activity. - Asthma is not controlled if:  - Symptoms are occurring >2 times a week OR  - >2 times a month nighttime awakenings  - You are requiring systemic steroids (prednisone /steroid injections) more than once per year  - Your require hospitalization for your asthma.  - Please call the clinic to schedule a follow up if these symptoms arise Avoid smoke exposure Stay up-to-date with your annual flu vaccines, COVID vaccines and pneumonia vaccines when indicated. - environmental allergy  testing via blood work  Follow up : 6 months, sooner if needed.  It was a pleasure seeing you again and your family in clinic today! Thank you for allowing me to participate in your care.  Jonathon Neighbors, MD [facility]   For Skin Care Regimen: Daily Care For Maintenance (daily and continue even once eczema controlled) - Use hypoallergenic hydrating ointment at least twice daily.  This must be done daily for control of flares. (Great options include Vaseline, CeraVe, Aquaphor, Aveeno, Cetaphil, VaniCream, etc) - Avoid detergents, soaps or lotions with fragrances/dyes - Limit showers/baths to 5 minutes and use luke warm water instead of hot, pat dry following baths, and apply moisturizer

## 2023-10-10 NOTE — Progress Notes (Signed)
 FOLLOW UP Date of Service/Encounter:  10/10/23  Subjective:  Laura Mendez (DOB: [birth year]) is a 83 y.o. female who [facility] in re-evaluation of the following: asthma/copd, contact dermatitis History obtained from: chart review and patient and son.  For Review, LV was on 02/28/23  with Dr.Everette Mall seen for intial visit for rash, and asthma/copd. See below for summary of history and diagnostics.  ----------------------------------------------------- Pertinent History/Diagnostics:  Rash: managed by dermatology/contact dermatitis Patient has a history of chronic itching and rash, previously treated with Dupixent . On initial exam, rash does not appear to be hives or eczema, but rather seborrheic keratosis and petechiae on thighs from scratching.  She is taking dupixent  every 2 weeks through dermatology. Has had positive patch testing to Quarternium 15. Ov Dermatology 07/06/22: "atopic dermatitis, currently flaring. Previously followed by Vila Grayer. Prior biopsy shoed changes maybe consistent with psoriasis. She has tried MTX 10 mg weekly, Dupixent , Xolair , Tremfya (no improvement), topical steroids, atarax , and Cellcept. She has asthma since 1979, prick allergy  testing at [facility]  (roaches and dust mites). No patch testing. ANA and RPR negative, Daughter with Lupus. 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 She states the only thing that helps is prednisone . Last dose of Dupixent  was about 3 weeks ago. " Asthma/COPD-managed previously by pulmoanry Chest CT: 11/02/22:  Lungs/Pleura: Emphysema. Bronchial wall thickening in the lower lobes with scattered mucous plugs. Clustered micro nodules in the left lower lobe. 5 mm subpleural nodule in the left lower lobe (5/92) stable since 2008 and benign. No follow-up recommended. 11/01/22: AEC 400 OV Pulmonary 07/29/22 with Dr. Linder Revere.  F former smoker followed for Severe asthma/ COPD  FEV1 47%,  complicated by allergic rhinitis,  paroxysmal A. Fib Labs 11/23/16- allergy  profile total IgE 409 on Xolair , elevated specific IgE for dust mite and cockroach. EOS 3,300, Nl ANA and alpha-gal Derm Path report reviewed- Spongiotic/ eczema process- does not describe it as urticaria. Xolair  was changed to Nucala  June, 2018, to see if Xolair  was causing her rash-no improvement after 5 months Office Spirometry 12/20/2017-very severe obstructive airways disease.  FVC 2.09/60%, FEV1 0.90/34%, ratio 0.43 Quantiferon tb Gold 06/22/17- Neg" --------------------------------------------------- Today presents for follow-up. Discussed the use of AI scribe software for clinical note transcription with the patient, who gave verbal consent to proceed.  History of Present Illness   Laura Mendez is an 83 year old female with asthma and COPD who presents for a follow-up visit. She is accompanied by her son, [name]. She is looking for continuation of care for her asthma/copd as Dr. Linder Revere, her previous asthma/copd doctor, who has retired.  Her breathing is generally stable, with some difficulty during seasonal changes. She uses albuterol  as needed, carrying it in her pocket as a precaution, but does not use it daily. She uses Advair daily in the morning.  She has been using Dupixent  every other week, which is managed by dermatology. She injects it in her thigh and receives it by mail. Patch testing revealed an allergy  to quaternium-15, leading to changes in her home environment to reduce exposure.  Her son reports that her breathing has been stable over the past six months, and she uses albuterol  as a 'crutch' but not excessively. Environmental changes at home include professional carpet cleaning and changing bed covers.       Chart Review: Follows with Dermatology-last visit 08/11/23 for patch testing which was read as + to Quaternium 15 Follows with pulmonary, last seen 08/02/23:  Asthma/ COPD overlap  syndrome Chest tightness likely due to weather changes. Effective management with current medication. Discussed nebulizer options, continuing DuoNeb if needed.  All medications reviewed by clinical staff and updated in chart. No new pertinent medical or surgical history except as noted in HPI.  ROS: All others negative except as noted per HPI.   Objective:  BP 128/72 (BP Location: Left Arm, Patient Position: Sitting, Cuff Size: Normal)   Pulse 62   Temp 98.1 F (36.7 C) (Temporal)   Ht 5\' 10"  (1.778 m)   Wt 204 lb 4.8 oz (92.7 kg)   SpO2 95%   BMI 29.31 kg/m  Body mass index is 29.31 kg/m. Physical Exam: General Appearance:  Alert, cooperative, no distress, appears stated age  Head:  Normocephalic, without obvious abnormality, atraumatic  Eyes:  Conjunctiva clear, EOM's intact  Ears EACs normal bilaterally and normal TMs bilaterally  Nose: Nares normal, normal mucosa and no visible anterior polyps  Throat: Lips, tongue normal; teeth and gums normal, normal posterior oropharynx  Neck: Supple, symmetrical  Lungs:   clear to auscultation bilaterally, Respirations unlabored, no coughing  Heart:  regular rate and rhythm and no murmur, Appears well perfused  Extremities: No edema  Skin: erythematous, dry patches scattered on bilateral upper extremities and no rashes or lesions on visualized portions of skin  Neurologic: No gross deficits   Labs:  Lab Orders         Allergens, Zone 2     Spirometry:  Poor repeatability, FVC grade E, FEV1 A FVC 2.35L FEV1 0.89L, 39% R 0.38 Severe airway obstruction. Results affected by effort.  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 - Rescue Inhaler: Albuterol  (Proair /Ventolin ) 2 puffs . Use  every 4-6 hours as needed for chest tightness, wheezing, or coughing.   Can also use 15 minutes prior to exercise if you have symptoms with activity. - Asthma is not controlled if:  - Symptoms are occurring >2 times a week OR  - >2 times a month nighttime awakenings  - You are requiring systemic steroids (prednisone /steroid injections) more than once per year  - Your require hospitalization for your asthma.  - Please call the clinic to schedule a follow up if these symptoms arise Avoid smoke exposure Stay up-to-date with your annual flu vaccines, COVID vaccines and pneumonia vaccines when indicated. - c environmental allergy  testing via blood work  Follow up : 6 months, sooner if needed.  It was a pleasure seeing you again and your family in clinic today! Thank you for allowing me to participate in your care.  Jonathon Neighbors, MD [facility]  Other: none  Jonathon Neighbors, MD  [facility] 

## 2023-10-12 ENCOUNTER — Other Ambulatory Visit: Payer: Self-pay | Admitting: Internal Medicine

## 2023-10-13 ENCOUNTER — Ambulatory Visit: Payer: Self-pay | Admitting: Internal Medicine

## 2023-10-13 LAB — ALLERGENS, ZONE 2

## 2023-10-13 NOTE — Progress Notes (Signed)
 Please let Laura Mendez know that her allergy  testing is negative.

## 2023-10-20 ENCOUNTER — Other Ambulatory Visit: Payer: Self-pay | Admitting: Internal Medicine

## 2023-12-20 NOTE — Progress Notes (Signed)
 SABRA

## 2024-01-14 ENCOUNTER — Other Ambulatory Visit: Payer: Self-pay | Admitting: Internal Medicine

## 2024-01-24 ENCOUNTER — Other Ambulatory Visit: Payer: Self-pay | Admitting: Cardiology

## 2024-01-24 DIAGNOSIS — I480 Paroxysmal atrial fibrillation: Secondary | ICD-10-CM

## 2024-01-24 NOTE — Telephone Encounter (Signed)
 Prescription refill request for Eliquis  received. Indication:afib Last office visit:12/24 Drm:wzzid labs Age: 83 Weight:92.7  kg  Prescription refilled

## 2024-01-30 NOTE — Progress Notes (Unsigned)
 Patient ID: Laura Mendez, female    DOB: 1941/01/16, 83 y.o.   MRN: [id number]  HPI F former smoker followed for Severe asthma/ COPD FEV1 47%,  complicated by allergic rhinitis,  paroxysmal A. Fib Labs 11/23/16- allergy  profile total IgE 409 on Xolair , elevated specific IgE for dust mite and cockroach. EOS 3,300, Nl ANA and alpha-gal Derm Path report reviewed- Spongiotic/ eczema process- does not describe it as urticaria. Xolair  was changed to Nucala  June, 2018, to see if Xolair  was causing her rash-no improvement after 5 months Office Spirometry 12/20/2017-very severe obstructive airways disease.  FVC 2.09/60%, FEV1 0.90/34%, ratio 0.43 Quantiferon tb Gold 06/22/17- Neg ---------------------------------------------------------------.  08/02/23- 83 year old female former smoker followed for severe asthma/COPD(formerly used Xolair /Nucala ), complicated by allergic Rhinitis, paroxysmal A. Fib/ Eliquis ., MI, CVA, Rash/ Atopic Dermatitis, breast cancer R,  -Xarelto ,  neb Duoneb, Wixela 250, ventolin  hfa, Dupixent ,  Followed by [facility]  in [place], pending skin testing soon. Son comes with her- she is getting forgetful. Discussed the use of AI scribe software for clinical note transcription with the patient, who gave verbal consent to proceed. History of Present Illness   The patient, with a history of asthma and allergies, presents with a recent increase in chest tightness and hives. She reports that the chest tightness has been particularly severe over the past couple of days, describing it as feeling real tight. She manages her symptoms with prednisone  5mg  some days, which she takes irregularly due to concerns about the medication. She also uses an albuterol  inhaler as needed, which she has been using more frequently due to the change in weather. Continues Wixela and gets Dupixent  from her Allergist.  In addition to her asthma and allergies, the patient has a history of atrial fibrillation, which  is currently being managed with Eliquis . She reports that she is not currently experiencing any symptoms of atrial fibrillation.  The patient also has a history of gallbladder issues, which required the placement of a tube and bag for several months. She reports that she has been doing well since the tube was removed.  The patient is scheduled to undergo skin testing for her allergies in the near future. She is currently taking Dupixent  biweekly for her allergies, and she hopes that this will help with her symptoms.   Assessment and Plan:    Asthma/ COPD overlap syndrome Chest tightness likely due to weather changes. Effective management with current medication. Discussed nebulizer options, continuing DuoNeb if needed. Considered Ohtuvayre , but will wait since she is not needing DuoNeb currently. - Continue Wixela twice daily. - Use albuterol  as needed, up to three times daily. - Avoid weather-related triggers. - Consider DuoNeb if symptoms worsen.  Chronic Urticaria Hives and itching managed with Dupixent  and occasional prednisone . Scheduled for skin testing, advised to avoid antihistamines until after that.. - Avoid antihistamines before skin testing. - Continue Dupixent  every other Thursday. - Use prednisone  as needed per Allergist.  Atrial Fibrillation Managed with medication and Eliquis . Occasional extra beats, cardiologist monitoring. Discussed breathlessness risk if heart rate uncontrolled. - Continue Eliquis  as prescribed. - Follow up with cardiologist.  Gallbladder Disease Resolved after tube removal, doing well.  Follow-up for Lung Shadow Former smoker with previous lung shadow, no follow-up imaging since CT scan suggesting pneumonia. - Order chest x-ray to monitor for changes.      02/02/24-83 year old female former smoker followed for severe asthma/COPD(formerly used Xolair /Nucala ), complicated by allergic Rhinitis, paroxysmal A. Fib/ Eliquis ., MI, CVA, Rash/ Atopic  Dermatitis, breast  cancer R,  -Xarelto ,  neb Duoneb, Wixela 250, ventolin  hfa, Dupixent ,  Followed by [facility]  in [place],  Son comes with her- she is getting forgetful. ACT score- 20 Discussed the use of AI scribe software for clinical note transcription with the patient, who gave verbal consent to proceed.  History of Present Illness   Laura Mendez is an 83 year old female with Asthma/ COPD, AFib and recurrent urticarial rash who presents with shortness of breath and allergies. She is followed by her [facility] and by Cardiology  She experiences shortness of breath that varies with environmental conditions, worsening with pollen and humid weather, and improving in air-conditioned environments. She uses a rescue inhaler and inquires about refilling her regular breathing medications. No current symptoms of atrial fibrillation are present. She currently has little cough or wheeze. Urticaria has flared- she blames pollen and asks depomedrol. She takes prednisone  10 mg daily if itching is very bad, but has not started it this time. She continues Dupixent .     Assessment and Plan:    Asthma/ Chronic obstructive pulmonary disease (COPD) Intermittent dyspnea influenced by environmental factors. No acute exacerbation. - Refilled rescue inhaler prescription. - Advised to limit [employer] to one or two cans per day, as prompted by her son.SABRA   Urticaria She has seen Dermatology and Allergy  for this over the years -Depo 80 IM     CXR 08/02/23- Lungs are mildly hyperexpanded. Linear opacities in the right mid lung and left lung base, predominantly in the left upper lobe lingula, are overall mildly improved from the prior exam, consistent with atelectasis/scarring. No lung consolidation to suggest pneumonia. No pulmonary edema. No pleural effusion or pneumothorax. Skeletal structures are intact. IMPRESSION: 1. No acute cardiopulmonary disease.     Review of Systems-see HPI    + = positive Constitutional:   No-   weight loss, night sweats, fevers, chills, fatigue, lassitude. HEENT:   No-  headaches, difficulty swallowing, tooth/dental problems, sore throat,       No-  sneezing, +itching, ear ache,  nasal congestion, +post nasal drip,  CV:  No-   chest pain, orthopnea, PND, swelling in lower extremities, anasarca, dizziness, palpitations Resp:  + shortness of breath with exertion or at rest- unchanged             No-   productive cough,  + non-productive cough,  No-  coughing up of blood.              No-   change in color of mucus.  + wheezing.   Skin:  +rash or lesions. GI:  No-   heartburn, indigestion, abdominal pain, nausea, vomiting, GU:  MS:  +   joint pain or swelling.   Neuro- nothing unusual  Psych:  No- change in mood or affect. No depression or anxiety.  No memory loss  Objective:    General- Alert, Oriented, Affect-appropriate, Distress- none acute, Pleasant, + Overweight Skin- + areas of erythema and some scale on pressure sites- extensor elbows, back, thighs Lymphadenopathy- none Head- atraumatic            Eyes- Gross vision intact, PERRLA, conjunctivae clear secretions            Ears- Hearing, canals normal            Nose- Mild turbinate edema, No-Septal dev, mucus, polyps, erosion, perforation             Throat- Mallampati II , mucosa -not red, drainage- none, tonsils- atrophic  Neck- flexible , trachea midline, no stridor , thyroid  nl, carotid no bruit Chest - symmetrical excursion , unlabored           Heart/CV- RRR +, no murmur , no gallop  , no rub, nl s1 s2                           - JVD- none , edema- none, stasis changes- none, varices- none           Lung- +clear to P&A/ distant/unlabored, wheeze- none,  Cough-none, dullness-none, rub- none           Chest wall-  Abd-  Br/ Gen/ Rectal- Not done, not indicated Extrem- cyanosis- none, clubbing, none, atrophy- none, strength- nl Neuro- grossly intact to observation

## 2024-02-02 ENCOUNTER — Encounter: Payer: Self-pay | Admitting: Internal Medicine

## 2024-02-02 ENCOUNTER — Ambulatory Visit: Admitting: Internal Medicine

## 2024-02-02 VITALS — BP 114/64 | HR 67 | Temp 98.0°F | Ht 71.0 in | Wt 191.8 lb

## 2024-02-02 DIAGNOSIS — J4489 Other specified chronic obstructive pulmonary disease: Secondary | ICD-10-CM | POA: Diagnosis not present

## 2024-02-02 DIAGNOSIS — L509 Urticaria, unspecified: Secondary | ICD-10-CM | POA: Diagnosis not present

## 2024-02-02 MED ORDER — METHYLPREDNISOLONE ACETATE 80 MG/ML IJ SUSP
80.0000 mg | Freq: Once | INTRAMUSCULAR | Status: AC
  Administered 2024-02-02: 80 mg via INTRAMUSCULAR

## 2024-02-02 MED ORDER — ALBUTEROL SULFATE HFA 108 (90 BASE) MCG/ACT IN AERS: 2.0000 | INHALATION_SPRAY | RESPIRATORY_TRACT | 11 refills | Status: DC | PRN

## 2024-02-02 NOTE — Patient Instructions (Addendum)
 Order- Depo 80     dx Acute urticaria  Albuterol  rescue inhaler refilled  See your Allergist if the hives don't clear soon.

## 2024-02-03 ENCOUNTER — Encounter: Payer: Self-pay | Admitting: Internal Medicine

## 2024-02-17 ENCOUNTER — Other Ambulatory Visit: Payer: Self-pay | Admitting: Cardiology

## 2024-02-17 DIAGNOSIS — I480 Paroxysmal atrial fibrillation: Secondary | ICD-10-CM

## 2024-02-20 NOTE — Telephone Encounter (Addendum)
 Eliquis  5mg  refill request received. Patient is 83 years old, weight-87kg, Crea-0.90 on 11/07/22-need labs, Diagnosis-Afib, and last seen by Dr. Jeffrie on 05/11/23. Dose is appropriate based on dosing criteria.   Will need to locate updated labs if any; pt does not have any labs in the system. Spoke with son who states he is able to bring the patient on this upcoming Friday on 02/24/24 around 8am. Gave him instructions on the building location and lab location. Advised will send in a refill.   Placed lab order and released together.

## 2024-02-24 DIAGNOSIS — I480 Paroxysmal atrial fibrillation: Secondary | ICD-10-CM | POA: Diagnosis not present

## 2024-02-24 LAB — CBC

## 2024-02-25 LAB — BASIC METABOLIC PANEL WITH GFR
BUN/Creatinine Ratio: 22 (ref 12–28)
BUN: 20 mg/dL (ref 8–27)
CO2: 25 mmol/L (ref 20–29)
Calcium: 9.6 mg/dL (ref 8.7–10.3)
Chloride: 105 mmol/L (ref 96–106)
Creatinine, Ser: 0.91 mg/dL (ref 0.57–1.00)
Glucose: 81 mg/dL (ref 70–99)
Potassium: 4.6 mmol/L (ref 3.5–5.2)
Sodium: 145 mmol/L — ABNORMAL HIGH (ref 134–144)
eGFR: 63 mL/min/1.73 (ref >59)

## 2024-02-25 LAB — CBC
Hematocrit: 46.1 % (ref 34.0–46.6)
Hemoglobin: 13.7 g/dL (ref 11.1–15.9)
MCH: 25.4 pg — ABNORMAL LOW (ref 26.6–33.0)
MCHC: 29.7 g/dL — ABNORMAL LOW (ref 31.5–35.7)
MCV: 85 fL (ref 79–97)
Platelets: 281 x10E3/uL (ref 150–450)
RBC: 5.4 x10E6/uL — ABNORMAL HIGH (ref 3.77–5.28)
RDW: 18.6 % — ABNORMAL HIGH (ref 11.7–15.4)
WBC: 8.1 x10E3/uL (ref 3.4–10.8)

## 2024-02-28 ENCOUNTER — Other Ambulatory Visit: Payer: Self-pay | Admitting: *Deleted

## 2024-02-28 ENCOUNTER — Ambulatory Visit: Payer: Self-pay | Admitting: Cardiology

## 2024-02-28 DIAGNOSIS — I480 Paroxysmal atrial fibrillation: Secondary | ICD-10-CM

## 2024-02-28 MED ORDER — APIXABAN 5 MG PO TABS: 5.0000 mg | ORAL_TABLET | Freq: Two times a day (BID) | ORAL | 1 refills | Status: DC

## 2024-02-28 NOTE — Telephone Encounter (Signed)
 Eliquis  5mg  refill request received. Patient is 83 years old, weight-87kg, Crea-0.91 on 02/24/24, Diagnosis-Afib, and last seen by Dr. Jeffrie on 05/11/23. Dose is appropriate based on dosing criteria. Will send in refill to requested pharmacy.

## 2024-02-29 ENCOUNTER — Other Ambulatory Visit: Payer: Self-pay | Admitting: Internal Medicine

## 2024-02-29 DIAGNOSIS — J4489 Other specified chronic obstructive pulmonary disease: Secondary | ICD-10-CM

## 2024-03-01 ENCOUNTER — Ambulatory Visit: Admitting: Family Medicine

## 2024-03-02 ENCOUNTER — Ambulatory Visit: Payer: Medicare Other

## 2024-03-08 ENCOUNTER — Ambulatory Visit

## 2024-03-08 ENCOUNTER — Other Ambulatory Visit: Payer: Self-pay | Admitting: Internal Medicine

## 2024-03-08 VITALS — BP 110/80 | HR 58 | Ht 68.25 in | Wt 195.4 lb

## 2024-03-08 DIAGNOSIS — J4489 Other specified chronic obstructive pulmonary disease: Secondary | ICD-10-CM

## 2024-03-08 DIAGNOSIS — Z0000 Encounter for general adult medical examination without abnormal findings: Secondary | ICD-10-CM

## 2024-03-08 DIAGNOSIS — Z1231 Encounter for screening mammogram for malignant neoplasm of breast: Secondary | ICD-10-CM

## 2024-03-08 NOTE — Patient Instructions (Addendum)
 Ms. Cavey,  Thank you for taking the time for your Medicare Wellness Visit. I appreciate your continued commitment to your health goals. Please review the care plan we discussed, and feel free to reach out if I can assist you further.  Medicare recommends these wellness visits once per year to help you and your care team stay ahead of potential health issues. These visits are designed to focus on prevention, allowing your provider to concentrate on managing your acute and chronic conditions during your regular appointments.  Please note that Annual Wellness Visits do not include a physical exam. Some assessments may be limited, especially if the visit was conducted virtually. If needed, we may recommend a separate in-person follow-up with your provider.  Ongoing Care Seeing your primary care provider every 3 to 6 months helps us  monitor your health and provide consistent, personalized care. Last office visit on 03/06/2023.  Remember to discuss a Bone Density with Dr. Rollene during your next office visit.    Referrals If a referral was made during today's visit and you haven't received any updates within two weeks, please contact the referred provider directly to check on the status.  You have an order for:  [x]   3D Mammogram   Please call for appointment:  [facility] [address] [place], [place] [address] [phone]  Make sure to wear two-piece clothing.  No lotions, powders, or deodorants the day of the appointment. Make sure to bring picture ID and insurance card.  Bring list of medications you are currently taking including any supplements.    Recommended Screenings:  Health Maintenance  Topic Date Due   Zoster (Shingles) Vaccine (2 of 2) 12/30/2020   Medicare Annual Wellness Visit  11/17/2022   Breast Cancer Screening  12/18/2022   Flu Shot  12/23/2023   COVID-19 Vaccine (5 - 2025-26 season) 01/23/2024   DTaP/Tdap/Td vaccine (5 - Td or Tdap)  04/17/2031   Pneumococcal Vaccine for age over 68  Completed   DEXA scan (bone density measurement)  Completed   Meningitis B Vaccine  Aged Out       03/08/2024    8:18 AM  Advanced Directives  Does Patient Have a Medical Advance Directive? Yes  Type of Estate agent of [place];Living will  Copy of Healthcare Power of Attorney in Chart? No - copy requested   Advance Care Planning is important because it: Ensures you receive medical care that aligns with your values, goals, and preferences. Provides guidance to your family and loved ones, reducing the emotional burden of decision-making during critical moments.  Vision: Annual vision screenings are recommended for early detection of glaucoma, cataracts, and diabetic retinopathy. These exams can also reveal signs of chronic conditions such as diabetes and high blood pressure.  Dental: Annual dental screenings help detect early signs of oral cancer, gum disease, and other conditions linked to overall health, including heart disease and diabetes.  Please see the attached documents for additional preventive care recommendations.

## 2024-03-08 NOTE — Progress Notes (Cosign Needed Addendum)
 Subjective:   Laura Mendez is a 83 y.o. who presents for a Medicare Wellness preventive visit.  As a reminder, Annual Wellness Visits don't include a physical exam, and some assessments may be limited, especially if this visit is performed virtually. We may recommend an in-person follow-up visit with your provider if needed.  Visit Complete: In person  Persons Participating in Visit: Patient's son and patient was present during visit.  AWV Questionnaire: Yes: Patient Medicare AWV questionnaire was completed by the patient on 03/05/2024; I have confirmed that all information answered by patient is correct and no changes since this date.  Cardiac Risk Factors include: advanced age (>6men, >53 women);Other (see comment);dyslipidemia, Risk factor comments: A-fib, CHF, COPD, AKI     Objective:    Today's Vitals   03/08/24 0814  BP: 110/80  Pulse: (!) 58  SpO2: 98%  Weight: 195 lb 6.4 oz (88.6 kg)  Height: 5' 8.25 (1.734 m)   Body mass index is 29.49 kg/m.     03/08/2024    8:18 AM 02/25/2023    8:33 AM 12/29/2022    9:37 AM 11/02/2022   12:42 PM 10/19/2022    5:03 PM 11/23/2021    7:02 PM 11/23/2021    2:00 AM  Advanced Directives  Does Patient Have a Medical Advance Directive? Yes Yes Yes Yes Yes  No  Type of Estate Agent of [place];Living will Living will;Healthcare Power of Attorney Living will;Healthcare Power of [employer] Power of [place];Living will Healthcare Power of [place];Living will  Healthcare Power of Attorney  Does patient want to make changes to medical advance directive?   No - Patient declined No - Patient declined No - Patient declined  No - Patient declined  Copy of Healthcare Power of Attorney in Chart? No - copy requested  No - copy requested No - copy requested No - copy requested  No - copy requested  Would patient like information on creating a medical advance directive?      No - Patient declined     Current  Medications (verified) Outpatient Encounter Medications as of 03/08/2024  Medication Sig   albuterol  (VENTOLIN  HFA) 108 (90 Base) MCG/ACT inhaler INHALE 2 PUFFS BY MOUTH EVERY 4 HOURS AS NEEDED   ammonium lactate  (LAC-HYDRIN ) 12 % cream Apply 1 Application topically as needed for dry skin.   apixaban  (ELIQUIS ) 5 MG TABS tablet Take 1 tablet (5 mg total) by mouth 2 (two) times daily.   Apoaequorin (PREVAGEN PO) Take by mouth daily.   DUPIXENT  300 MG/2ML SOAJ Every other Thursday   fluticasone -salmeterol (ADVAIR) 250-50 MCG/ACT AEPB Inhale 1 puff into the lungs in the morning and at bedtime.   hydrOXYzine  (ATARAX ) 10 MG tablet Take 10 mg by mouth as needed.   Multiple Vitamin (MULTIVITAMIN) tablet Take 1 tablet by mouth daily.   pantoprazole  (PROTONIX ) 40 MG tablet Take 1 tablet by mouth once daily   predniSONE  (DELTASONE ) 10 MG tablet 1 DAILY OR AS DIRECTED   triamcinolone  cream (KENALOG ) 0.1 % Apply 1 Application topically 2 (two) times daily.   Facility-Administered Encounter Medications as of 03/08/2024  Medication   Mepolizumab  SOLR 100 mg    Allergies (verified) Clarithromycin, Aspirin , Ibuprofen, Codeine, Diltiazem , Methotrexate, Tape, Tapentadol, and Verapamil  hcl er   History: Past Medical History:  Diagnosis Date   Asthma    Breast cancer (HCC)    Chronic airway obstruction, not elsewhere classified    Diastolic dysfunction without heart failure    Disorder  of bone and cartilage, unspecified    Dysrhythmia    A-Fib   Family history of colon cancer    Family history of kidney cancer    Family history of ovarian cancer    Paroxysmal atrial flutter (HCC)    Stroke [facility])    TIA   TIA (transient ischemic attack)    Unspecified asthma(493.90)    Urticaria    Past Surgical History:  Procedure Laterality Date   ABDOMINAL HYSTERECTOMY     BREAST LUMPECTOMY Right 12/30/2017   BREAST LUMPECTOMY WITH RADIOACTIVE SEED LOCALIZATION Right 12/30/2017   Procedure: BREAST  LUMPECTOMY WITH RADIOACTIVE SEED LOCALIZATION X'S 3;  Surgeon: Ebbie Cough, MD;  Location: [facility] OR;  Service: General;  Laterality: Right;   BUNIONECTOMY Bilateral    EYE SURGERY Bilateral    cataract removal   IR CHOLANGIOGRAM EXISTING TUBE  01/26/2023   IR EXCHANGE BILIARY DRAIN  11/02/2022   IR EXCHANGE BILIARY DRAIN  12/29/2022   IR PERC CHOLECYSTOSTOMY  10/21/2022   IR RADIOLOGIST EVAL & MGMT  12/13/2022   IR REMOVAL BILIARY DRAIN  01/26/2023   IR REMOVAL OF CALCULI/DEBRIS BILIARY DUCT/GB  12/29/2022   IR SINUS/FIST TUBE CHK-NON GI  12/02/2022   IR US  GUIDE VASC ACCESS LEFT  01/26/2023   LEFT HEART CATH AND CORONARY ANGIOGRAPHY N/A 11/23/2021   Procedure: LEFT HEART CATH AND CORONARY ANGIOGRAPHY;  Surgeon: Wonda Sharper, MD;  Location: [facility] INVASIVE CV LAB;  Service: Cardiovascular;  Laterality: N/A;   Family History  Problem Relation Age of Onset   COPD Father    Colon cancer Mother 40       mets to pancreas and liver   Kidney cancer Sister 37       d. 75   Lung cancer Sister        d. 29, smoker   Ovarian cancer Sister 74       d. 62   Cirrhosis Sister        d. 42   Melanoma Sister 40   Ovarian cancer Maternal Grandmother        d. 47   Heart disease Maternal Grandfather        rheumatic heart disease   Rectal cancer Paternal Grandfather        d. 24   Parkinson's disease Brother    Ovarian cancer Maternal Aunt    Colon cancer Maternal Uncle        d. 72-73   Heart attack Paternal Uncle        d. 69   Kidney cancer Brother 13   Colon cancer Maternal Uncle        d. 48   Cancer Other        MGMs pat 1/2 sister with uterine and rectal cancer   Colon cancer Cousin        d. 51 - mat first cousin   Breast cancer Neg Hx    Social History   Socioeconomic History   Marital status: Divorced    Spouse name: Not on file   Number of children: 2   Years of education: Not on file   Highest education level: 12th grade  Occupational History   Occupation: retired  Tobacco  Use   Smoking status: Former    Current packs/day: 0.00    Types: Cigarettes    Start date: 05/24/1977    Quit date: 05/24/1977    Years since quitting: 46.8   Smokeless tobacco: Never   Tobacco comments:  7/7 pt states she occasionally smoked, let cigs burn more than she smoked them  Vaping Use   Vaping status: Never Used  Substance and Sexual Activity   Alcohol use: No   Drug use: No   Sexual activity: Not on file  Other Topics Concern   Not on file  Social History Narrative   Right handed   1 coke per day   Lives alone/2025   Social Drivers of Health   Financial Resource Strain: Low Risk  (03/05/2024)   Overall Financial Resource Strain (CARDIA)    Difficulty of Paying Living Expenses: Not hard at all  Food Insecurity: No Food Insecurity (03/05/2024)   Hunger Vital Sign    Worried About Running Out of Food in the Last Year: Never true    Ran Out of Food in the Last Year: Never true  Transportation Needs: No Transportation Needs (03/05/2024)   PRAPARE - Administrator, Civil Service (Medical): No    Lack of Transportation (Non-Medical): No  Physical Activity: Insufficiently Active (03/05/2024)   Exercise Vital Sign    Days of Exercise per Week: 1 day    Minutes of Exercise per Session: 10 min  Stress: No Stress Concern Present (03/05/2024)   [employer] of Occupational Health - Occupational Stress Questionnaire    Feeling of Stress: Not at all  Social Connections: Moderately Isolated (03/05/2024)   Social Connection and Isolation Panel    Frequency of Communication with Friends and Family: Three times a week    Frequency of Social Gatherings with Friends and Family: Twice a week    Attends Religious Services: 1 to 4 times per year    Active Member of [employer] or Organizations: No    Attends Engineer, Structural: Not on file    Marital Status: Divorced    Tobacco Counseling Counseling given: Not Answered Tobacco comments: 7/7 pt states  she occasionally smoked, let cigs burn more than she smoked them    Clinical Intake:  Pre-visit preparation completed: Yes  Pain : No/denies pain     BMI - recorded: 29.49 Nutritional Status: BMI 25 -29 Overweight Nutritional Risks: None Diabetes: No  Lab Results  Component Value Date   HGBA1C 5.8 (H) 08/24/2023   HGBA1C 5.9 (H) 11/23/2021   HGBA1C 5.1 06/29/2021     How often do you need to have someone help you when you read instructions, pamphlets, or other written materials from your doctor or pharmacy?: 1 - Never  Interpreter Needed?: No  Information entered by :: Dru Laurel, RMA   Activities of Daily Living     03/05/2024    2:06 PM 08/24/2023    9:00 AM  In your present state of health, do you have any difficulty performing the following activities:  Hearing? 0 0  Vision? 0 0  Difficulty concentrating or making decisions? 0 0  Walking or climbing stairs? 1 0  Dressing or bathing? 0 0  Doing errands, shopping? 0 0  Preparing Food and eating ? N   Using the Toilet? N   In the past six months, have you accidently leaked urine? N   Do you have problems with loss of bowel control? N   Managing your Medications? N   Managing your Finances? N   Housekeeping or managing your Housekeeping? N     Patient Care Team: Rollene Almarie LABOR, MD as PCP - General (Internal Medicine) Jeffrie Oneil BROCKS, MD as PCP - Cardiology (Cardiology) Cindie Ole DASEN, MD  as PCP - Electrophysiology (Cardiology) Ebbie Cough, MD as Consulting Physician (General Surgery) Odean Potts, MD as Consulting Physician (Hematology and Oncology) Shannon Agent, MD as Consulting Physician (Radiation Oncology)  I have updated your Care Teams any recent Medical Services you may have received from other providers in the past year.     Assessment:   This is a routine wellness examination for Porter.  Hearing/Vision screen Hearing Screening - Comments:: Denies hearing difficulties    Vision Screening - Comments:: Denies vision issues.[facility]    Goals Addressed   None    Depression Screen     03/08/2024    8:21 AM 02/25/2023    8:30 AM 02/25/2023    8:27 AM 02/25/2023    8:21 AM 12/08/2021   12:12 PM 11/16/2021    9:33 AM 11/16/2021    9:31 AM  PHQ 2/9 Scores  PHQ - 2 Score 0 0 0 0 0 0 0  PHQ- 9 Score 0          Fall Risk     03/05/2024    2:06 PM 02/25/2023    8:25 AM 02/25/2023    8:21 AM 02/25/2023    7:27 AM 01/05/2023   12:01 PM  Fall Risk   Falls in the past year? 0 0 0 0 0  Number falls in past yr: 0 0 0    Injury with Fall? 0 0 0    Risk for fall due to :  No Fall Risks No Fall Risks    Follow up  Falls evaluation completed Falls evaluation completed      MEDICARE RISK AT HOME:  Medicare Risk at Home Any stairs in or around the home?: (Patient-Rptd) Yes If so, are there any without handrails?: (Patient-Rptd) No Home free of loose throw rugs in walkways, pet beds, electrical cords, etc?: (Patient-Rptd) Yes Adequate lighting in your home to reduce risk of falls?: (Patient-Rptd) Yes Life alert?: (Patient-Rptd) No Use of a cane, walker or w/c?: (Patient-Rptd) Yes Grab bars in the bathroom?: (Patient-Rptd) Yes Elevated toilet seat or a handicapped toilet?: (Patient-Rptd) Yes  TIMED UP AND GO:  Was the test performed?  Yes  Length of time to ambulate 10 feet: 25 sec Gait slow and steady with assistive device  Cognitive Function: 6CIT completed    08/24/2023    9:00 AM  MMSE - Mini Mental State Exam  Orientation to time 3  Orientation to Place 5  Registration 3  Attention/ Calculation 2  Recall 1  Language- name 2 objects 2  Language- repeat 1  Language- follow 3 step command 3  Language- read & follow direction 1  Write a sentence 1  Copy design 1  Total score 23        03/08/2024    8:19 AM 02/25/2023    8:30 AM  6CIT Screen  What Year? 0 points 0 points  What month? 0 points 3 points  What time? 0 points  0 points  Count back from 20 0 points 2 points  Months in reverse 2 points 0 points  Repeat phrase 4 points 2 points  Total Score 6 points 7 points    Immunizations Immunization History  Administered Date(s) Administered   Fluad Quad(high Dose 65+) 02/06/2019, 04/08/2020, 03/09/2021   H1N1 04/24/2008   INFLUENZA, HIGH DOSE SEASONAL PF 03/05/2016, 03/09/2017, 02/22/2018   Influenza Split 02/03/2011, 02/18/2012, 02/09/2013, 03/17/2015   Influenza Whole 02/22/2007, 02/16/2008, 02/21/2009, 01/23/2010   Influenza-Unspecified 02/03/2011, 02/09/2013, 02/22/2014, 03/08/2022  PFIZER(Purple Top)SARS-COV-2 Vaccination 06/19/2019, 07/10/2019, 03/18/2020, 06/24/2021   Pneumococcal Conjugate-13 01/08/2015   Pneumococcal Polysaccharide-23 05/25/2003, 02/16/2017   Td 05/24/2004   Tdap 06/24/2015, 04/16/2021   Tetanus 05/24/2004   Zoster Recombinant(Shingrix) 11/04/2020   Zoster, Live 08/22/2011    Screening Tests Health Maintenance  Topic Date Due   Zoster Vaccines- Shingrix (2 of 2) 12/30/2020   Medicare Annual Wellness (AWV)  11/17/2022   Mammogram  12/18/2022   Influenza Vaccine  12/23/2023   COVID-19 Vaccine (5 - 2025-26 season) 01/23/2024   DTaP/Tdap/Td (5 - Td or Tdap) 04/17/2031   Pneumococcal Vaccine: 50+ Years  Completed   DEXA SCAN  Completed   Meningococcal B Vaccine  Aged Out    Health Maintenance Items Addressed: Mammogram ordered, See Nurse Notes at the end of this note  Additional Screening:  Vision Screening: Recommended annual ophthalmology exams for early detection of glaucoma and other disorders of the eye. Is the patient up to date with their annual eye exam?  Yes  Who is the provider or what is the name of the office in which the patient attends annual eye exams? [facility]/per pt-up to date   Dental Screening: Recommended annual dental exams for proper oral hygiene  Community Resource Referral / Chronic Care Management: CRR required this visit?   No   CCM required this visit?  No   Plan:    I have personally reviewed and noted the following in the patient's chart:   Medical and social history Use of alcohol, tobacco or illicit drugs  Current medications and supplements including opioid prescriptions. Patient is not currently taking opioid prescriptions. Functional ability and status Nutritional status Physical activity Advanced directives List of other physicians Hospitalizations, surgeries, and ER visits in previous 12 months Vitals Screenings to include cognitive, depression, and falls Referrals and appointments  In addition, I have reviewed and discussed with patient certain preventive protocols, quality metrics, and best practice recommendations. A written personalized care plan for preventive services as well as general preventive health recommendations were provided to patient.   Lehua Flores L Kimberlynn Lumbra, CMA   03/08/2024   After Visit Summary: (MyChart) Due to this being a telephonic visit, the after visit summary with patients personalized plan was offered to patient via MyChart   Notes: Patient stated that she has had a reaction to the Flu vaccine once so she does not take it anymore and also she reacted to the 1st Shingrix vaccine.  These items are not list on her allergy  meds.  Patient  scheduled for her physical with Dr. Rollene for 2025 during this visit.  Patient would like to discuss a DEXA during her next office visit. She had no other concerns to address today.  Medical screening examination/treatment/procedure(s) were performed by non-physician practitioner and as supervising physician I was immediately available for consultation/collaboration.  I agree with above. Karlynn Noel, MD

## 2024-03-23 ENCOUNTER — Other Ambulatory Visit: Payer: Self-pay | Admitting: Internal Medicine

## 2024-03-23 DIAGNOSIS — J4489 Other specified chronic obstructive pulmonary disease: Secondary | ICD-10-CM

## 2024-03-26 ENCOUNTER — Encounter: Payer: Self-pay | Admitting: Internal Medicine

## 2024-03-26 ENCOUNTER — Ambulatory Visit: Admitting: Internal Medicine

## 2024-03-26 VITALS — BP 130/80 | HR 62 | Temp 97.6°F | Ht 68.25 in | Wt 203.0 lb

## 2024-03-26 DIAGNOSIS — J4489 Other specified chronic obstructive pulmonary disease: Secondary | ICD-10-CM

## 2024-03-26 DIAGNOSIS — M8580 Other specified disorders of bone density and structure, unspecified site: Secondary | ICD-10-CM

## 2024-03-26 DIAGNOSIS — Z0000 Encounter for general adult medical examination without abnormal findings: Secondary | ICD-10-CM | POA: Diagnosis not present

## 2024-03-26 DIAGNOSIS — E782 Mixed hyperlipidemia: Secondary | ICD-10-CM

## 2024-03-26 DIAGNOSIS — I480 Paroxysmal atrial fibrillation: Secondary | ICD-10-CM | POA: Diagnosis not present

## 2024-03-26 DIAGNOSIS — I5032 Chronic diastolic (congestive) heart failure: Secondary | ICD-10-CM

## 2024-03-26 DIAGNOSIS — Z853 Personal history of malignant neoplasm of breast: Secondary | ICD-10-CM

## 2024-03-26 MED ORDER — APIXABAN 5 MG PO TABS: 5.0000 mg | ORAL_TABLET | Freq: Two times a day (BID) | ORAL | 1 refills | Status: AC

## 2024-03-26 MED ORDER — ALBUTEROL SULFATE HFA 108 (90 BASE) MCG/ACT IN AERS: 2.0000 | INHALATION_SPRAY | RESPIRATORY_TRACT | 11 refills | Status: DC | PRN

## 2024-03-26 NOTE — Progress Notes (Unsigned)
   Subjective:   Patient ID: Laura Mendez, female    DOB: [birth year], 83 y.o.   MRN: [id number]  Discussed the use of AI scribe software for clinical note transcription with the patient, who gave verbal consent to proceed.  History of Present Illness Laura Mendez is an 83 year old female  She experiences persistent hives and itching on her back, despite treatment with Dupixent , which has alleviated the worst of the itching. She uses moisturizer and soap for her skin care routine. The hives have been a chronic issue, and she is under the care of a dermatologist and an allergist.  She is currently taking Eliquis  and reports no significant bruising or bleeding issues. She uses a cane for balance and reports no falls.  Review of Systems  Constitutional: Negative.   HENT: Negative.    Eyes: Negative.   Respiratory:  Negative for cough, chest tightness and shortness of breath.   Cardiovascular:  Negative for chest pain, palpitations and leg swelling.  Gastrointestinal:  Negative for abdominal distention, abdominal pain, constipation, diarrhea, nausea and vomiting.  Musculoskeletal: Negative.   Skin: Negative.   Neurological: Negative.   Psychiatric/Behavioral: Negative.      Objective:  Physical Exam Constitutional:      Appearance: She is well-developed.  HENT:     Head: Normocephalic and atraumatic.  Cardiovascular:     Rate and Rhythm: Normal rate and regular rhythm.  Pulmonary:     Effort: Pulmonary effort is normal. No respiratory distress.     Breath sounds: Normal breath sounds. No wheezing or rales.  Abdominal:     General: Bowel sounds are normal. There is no distension.     Palpations: Abdomen is soft.     Tenderness: There is no abdominal tenderness.  Musculoskeletal:     Cervical back: Normal range of motion.  Skin:    General: Skin is warm and dry.  Neurological:     Mental Status: She is alert and oriented to person, place, and time.      Coordination: Coordination normal.     Vitals:   03/26/24 0756  BP: 130/80  Pulse: 62  Temp: 97.6 F (36.4 C)  TempSrc: Temporal  SpO2: 97%  Weight: 203 lb (92.1 kg)  Height: 5' 8.25 (1.734 m)    Assessment and Plan

## 2024-03-28 ENCOUNTER — Encounter: Payer: Self-pay | Admitting: Internal Medicine

## 2024-03-28 NOTE — Assessment & Plan Note (Signed)
 Refilled eliquis  and is stable.

## 2024-03-28 NOTE — Assessment & Plan Note (Signed)
 No flare today and not taking medications for this. Good BP control.

## 2024-03-28 NOTE — Assessment & Plan Note (Signed)
 No flare today and continue albuterol  and advair.

## 2024-03-28 NOTE — Assessment & Plan Note (Signed)
 Recent lipid panel at goal continue regimen of diet management.

## 2024-03-28 NOTE — Assessment & Plan Note (Signed)
 Flu shot declines. Pneumonia up to date. Shingrix due at pharmacy. Tetanus up to date. Colonoscopy aged out. Mammogram aged out, pap smear aged out and dexa complete. Counseled about sun safety and mole surveillance. Counseled about the dangers of distracted driving. Given 10 year screening recommendations.

## 2024-03-28 NOTE — Assessment & Plan Note (Signed)
Continue yearly mammogram.

## 2024-03-28 NOTE — Assessment & Plan Note (Signed)
 DEXA every 3-5 years. Stable and no falls/fractures.

## 2024-04-09 ENCOUNTER — Ambulatory Visit: Admitting: Internal Medicine

## 2024-04-09 ENCOUNTER — Other Ambulatory Visit: Payer: Self-pay | Admitting: Internal Medicine

## 2024-04-10 ENCOUNTER — Other Ambulatory Visit: Payer: Self-pay | Admitting: Internal Medicine

## 2024-04-10 DIAGNOSIS — J4489 Other specified chronic obstructive pulmonary disease: Secondary | ICD-10-CM

## 2024-04-13 ENCOUNTER — Other Ambulatory Visit: Payer: Self-pay | Admitting: Internal Medicine

## 2024-04-13 DIAGNOSIS — J4489 Other specified chronic obstructive pulmonary disease: Secondary | ICD-10-CM

## 2024-04-16 ENCOUNTER — Ambulatory Visit: Admitting: Internal Medicine

## 2024-04-23 ENCOUNTER — Ambulatory Visit: Admitting: Internal Medicine

## 2024-04-23 ENCOUNTER — Encounter: Payer: Self-pay | Admitting: Internal Medicine

## 2024-04-23 ENCOUNTER — Other Ambulatory Visit: Payer: Self-pay

## 2024-04-23 VITALS — BP 130/78 | HR 68 | Temp 98.0°F | Ht 66.5 in | Wt 199.4 lb

## 2024-04-23 DIAGNOSIS — J4489 Other specified chronic obstructive pulmonary disease: Secondary | ICD-10-CM

## 2024-04-23 DIAGNOSIS — R21 Rash and other nonspecific skin eruption: Secondary | ICD-10-CM | POA: Diagnosis not present

## 2024-04-23 DIAGNOSIS — L2389 Allergic contact dermatitis due to other agents: Secondary | ICD-10-CM | POA: Diagnosis not present

## 2024-04-23 DIAGNOSIS — L299 Pruritus, unspecified: Secondary | ICD-10-CM | POA: Diagnosis not present

## 2024-04-23 MED ORDER — TRIAMCINOLONE ACETONIDE 0.1 % EX OINT: TOPICAL_OINTMENT | CUTANEOUS | 1 refills | Status: DC

## 2024-04-23 MED ORDER — METHYLPREDNISOLONE ACETATE 40 MG/ML IJ SUSP
40.0000 mg | Freq: Once | INTRAMUSCULAR | Status: AC
  Administered 2024-04-23: 40 mg via INTRAMUSCULAR

## 2024-04-23 NOTE — Patient Instructions (Addendum)
 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 - Rescue Inhaler: Albuterol  (Proair /Ventolin ) 2 puffs . Use  every 4-6 hours as needed for chest tightness, wheezing, or coughing.   Can also use 15 minutes prior to exercise if you have symptoms with activity. - Asthma is not controlled if:  - Symptoms are occurring >2 times a week OR  - >2 times a month nighttime awakenings  - You are requiring systemic steroids (prednisone /steroid injections) more than once per year  - Your require hospitalization for your asthma.  - Please call the clinic to schedule a follow up if these symptoms arise Avoid smoke exposure Stay up-to-date with your annual flu vaccines, COVID vaccines and pneumonia vaccines when indicated. - environmental allergy  testing was negative 10/10/23  Follow up : as needed It was a pleasure seeing you again and your family in clinic today! Thank you for allowing me to participate in your care.  Rocky Endow, MD [facility]   For Skin Care Regimen: Daily Care For  Maintenance (daily and continue even once eczema controlled) - Use hypoallergenic hydrating ointment at least twice daily.  This must be done daily for control of flares. (Great options include Vaseline, CeraVe, Aquaphor, Aveeno, Cetaphil, VaniCream, etc) - Avoid detergents, soaps or lotions with fragrances/dyes - Limit showers/baths to 5 minutes and use luke warm water instead of hot, pat dry following baths, and apply moisturizer

## 2024-04-23 NOTE — Progress Notes (Signed)
 FOLLOW UP Date of Service/Encounter:   04/23/2024  Subjective:  Laura Mendez (DOB: January 14, 1941) is a 83 y.o. female who [facility] in re-evaluation of the following: asthma/copd, contact dermatitis  History obtained from: chart review and patient and daughter in law.  For Review, LV was on 10/10/23  with Dr.Ariellah Mendez seen for routine follow-up. See below for summary of history and diagnostics.   Therapeutic plans/changes recommended: we did order blood work for environmental allergies at that visit and it was negative.  FEV1 39% on advair 250 1 puff BID ----------------------------------------------------- Pertinent History/Diagnostics:  Rash: managed by dermatology/contact dermatitis Patient has a history of chronic itching and rash, previously treated with Dupixent . On initial exam, rash does not appear to be hives or eczema, but rather seborrheic keratosis and petechiae on thighs from scratching.  She is taking dupixent  every 2 weeks through dermatology. Has had positive patch testing to Quarternium 15. Ov Dermatology 07/06/22: atopic dermatitis, currently flaring. Previously followed by Laura Mendez. Prior biopsy shoed changes maybe consistent with psoriasis. She has tried MTX 10 mg weekly, Dupixent , Xolair , Tremfya (no improvement), topical steroids, atarax , and Cellcept. She has asthma since 1979, prick allergy  testing at [facility]  (roaches and dust mites). No patch testing. ANA and RPR negative, Daughter with Lupus. 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 She states the only thing that helps is prednisone . Last dose of Dupixent  was about 3 weeks ago.  Asthma/COPD-managed previously by pulmoanry Chest CT: 11/02/22:  Lungs/Pleura: Emphysema. Bronchial wall thickening in the lower lobes with scattered mucous plugs. Clustered micro nodules in the left lower lobe. 5 mm subpleural nodule in the left lower lobe (5/92)  stable since 2008 and benign. No follow-up recommended. 11/01/22: [facility]EC 400 OV Pulmonary 07/29/22 with Dr. Neysa.  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 Fib Labs 11/23/16- allergy  profile total IgE 409 on Xolair , elevated specific IgE for dust mite and cockroach. EOS 3,300, Nl ANA and alpha-gal Derm Path report reviewed- Spongiotic/ eczema process- does not describe it as urticaria. Xolair  was changed to Nucala  June, 2018, to see if Xolair  was causing her rash-no improvement after 5 months Office Spirometry 12/20/2017-very severe obstructive airways disease.  FVC 2.09/60%, FEV1 0.90/34%, ratio 0.43 Quantiferon tb Gold 06/22/17- Neg --------------------------------------------------- Today presents for follow-up. Discussed the use of AI scribe software for clinical note transcription with the patient, who gave verbal consent to proceed.  History of Present Illness Laura Mendez is an 83 year old female with chronic asthma who presents for a routine checkup and management of rash. She is accompanied by her daughter-in-law, Laura Mendez.  Pruritus and urticaria - Persistent rash and itching ongoing despite treatment. - Itching may be related to underlying dry skin - Receives Dupixent  injections every two weeks, stored in the refrigerator, without significant improvement in pruritus. - Uses an over-the-counter cream from [employer] for skin symptoms. - No recent prescription for topical steroids from dermatologist. - has not followed-up with dermatology since March  Contact allergies - Patch testing at dermatologist's office revealed contact allergies, including a positive reaction to quaternium-15. - Uses a soap but is unsure if contains quaternium-15. - Last dermatology visit was in March for patch testing; follow-up was scheduled for July but not completed.  Asthma and respiratory symptoms - Chronic asthma with intermittent  use of prednisone  throughout her life. - Breathing generally stable. - Occasional shortness of breath,  attributed to weather changes. - Attempts to avoid pollen exposure to minimize respiratory symptoms. - follows closely with pulmonology and is using inhalers as prescribed by their office   Chart Review: LV pulmonary 02/02/24-seen by Dr. Neysa. Taking Wixela BID, albuterol  as needed.   All medications reviewed by clinical staff and updated in chart. No new pertinent medical or surgical history except as noted in HPI.  ROS: All others negative except as noted per HPI.   Objective:  BP 130/78 (BP Location: Left Arm, Patient Position: Sitting, Cuff Size: Normal)   Pulse 68   Temp 98 F (36.7 C) (Temporal)   Ht 5' 6.5 (1.689 m)   Wt 199 lb 6.4 oz (90.4 kg)   SpO2 98%   BMI 31.70 kg/m  Body mass index is 31.7 kg/m. Physical Exam: General Appearance:  Alert, cooperative, no distress, appears stated age  Head:  Normocephalic, without obvious abnormality, atraumatic  Eyes:  Conjunctiva clear, EOM's intact  Ears EACs normal bilaterally and normal TMs bilaterally  Nose: Nares normal, normal mucosa  Throat: Lips, tongue normal; teeth and gums normal, normal posterior oropharynx  Neck: Supple, symmetrical  Lungs:   clear to auscultation bilaterally, Respirations unlabored, no coughing  Heart:  regular rate and rhythm and no murmur, Appears well perfused  Extremities: No edema  Skin: Dry skin with overlying scale covering back with some erythema, present on anterior chest; xerosis  Neurologic: No gross deficits   Labs:  Lab Orders  No laboratory test(s) ordered today    Assessment/Plan   Patient is being followed by both pulmonary for COPD/asthma and dermatology for rash on dupixent  with positive patch testing.  Environmental allergy  testing negative.  No signs of hives or AD on today's exam.  She will follow-up with our clinic as needed if new problems arise.   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  Rash described as itchy. Xerosis is present. 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 - Rescue Inhaler: Albuterol  (Proair /Ventolin ) 2 puffs . Use  every 4-6 hours as needed for chest tightness, wheezing, or coughing.   Can also use 15 minutes prior to exercise if you have symptoms with activity. - Asthma is not controlled if:  - Symptoms are occurring >2 times a week OR  - >2 times a month nighttime awakenings  - You are requiring systemic steroids (prednisone /steroid injections) more than once per year  - Your require hospitalization for your asthma.  - Please call the clinic to schedule a follow up if these symptoms arise Avoid smoke exposure Stay up-to-date with your annual flu vaccines, COVID vaccines and pneumonia vaccines when indicated. - environmental allergy  testing was negative 10/10/23  Follow  up : as needed It was a pleasure seeing you again and your family in clinic today! Thank you for allowing me to participate in your care.   Other: none  Rocky Endow, MD  [facility] 

## 2024-05-02 ENCOUNTER — Other Ambulatory Visit: Payer: Self-pay | Admitting: Internal Medicine

## 2024-05-02 DIAGNOSIS — J4489 Other specified chronic obstructive pulmonary disease: Secondary | ICD-10-CM

## 2024-06-19 ENCOUNTER — Other Ambulatory Visit: Payer: Self-pay | Admitting: *Deleted

## 2024-06-19 MED ORDER — TRIAMCINOLONE ACETONIDE 0.1 % EX OINT: TOPICAL_OINTMENT | CUTANEOUS | 1 refills | Status: AC

## 2024-07-12 ENCOUNTER — Ambulatory Visit: Admitting: Family Medicine

## 2024-11-26 ENCOUNTER — Ambulatory Visit: Admitting: Family Medicine

## 2025-03-29 ENCOUNTER — Ambulatory Visit

## 2025-03-29 ENCOUNTER — Encounter: Admitting: Internal Medicine
